# Patient Record
Sex: Female | Born: 1945
Health system: Southern US, Community
[De-identification: ages and names within clinical notes are randomized; demographics above are authoritative.]

## PROBLEM LIST (undated history)

## (undated) DIAGNOSIS — I739 Peripheral vascular disease, unspecified: Secondary | ICD-10-CM

## (undated) DIAGNOSIS — M545 Low back pain, unspecified: Secondary | ICD-10-CM

## (undated) DIAGNOSIS — Z91041 Radiographic dye allergy status: Secondary | ICD-10-CM

## (undated) DIAGNOSIS — K635 Polyp of colon: Secondary | ICD-10-CM

## (undated) DIAGNOSIS — I5032 Chronic diastolic (congestive) heart failure: Secondary | ICD-10-CM

## (undated) DIAGNOSIS — E785 Hyperlipidemia, unspecified: Secondary | ICD-10-CM

## (undated) DIAGNOSIS — I2510 Atherosclerotic heart disease of native coronary artery without angina pectoris: Secondary | ICD-10-CM

## (undated) DIAGNOSIS — R112 Nausea with vomiting, unspecified: Secondary | ICD-10-CM

## (undated) DIAGNOSIS — I779 Disorder of arteries and arterioles, unspecified: Secondary | ICD-10-CM

## (undated) DIAGNOSIS — E119 Type 2 diabetes mellitus without complications: Secondary | ICD-10-CM

## (undated) DIAGNOSIS — R740 Nonspecific elevation of levels of transaminase and lactic acid dehydrogenase [LDH]: Secondary | ICD-10-CM

## (undated) DIAGNOSIS — H402290 Chronic angle-closure glaucoma, unspecified eye, stage unspecified: Secondary | ICD-10-CM

## (undated) DIAGNOSIS — D649 Anemia, unspecified: Secondary | ICD-10-CM

## (undated) DIAGNOSIS — K2970 Gastritis, unspecified, without bleeding: Secondary | ICD-10-CM

## (undated) DIAGNOSIS — F32A Depression, unspecified: Secondary | ICD-10-CM

## (undated) DIAGNOSIS — Z9889 Other specified postprocedural states: Secondary | ICD-10-CM

## (undated) DIAGNOSIS — I10 Essential (primary) hypertension: Secondary | ICD-10-CM

## (undated) DIAGNOSIS — G709 Myoneural disorder, unspecified: Secondary | ICD-10-CM

## (undated) DIAGNOSIS — K219 Gastro-esophageal reflux disease without esophagitis: Secondary | ICD-10-CM

## (undated) DIAGNOSIS — I350 Nonrheumatic aortic (valve) stenosis: Secondary | ICD-10-CM

## (undated) DIAGNOSIS — R42 Dizziness and giddiness: Secondary | ICD-10-CM

## (undated) DIAGNOSIS — F419 Anxiety disorder, unspecified: Secondary | ICD-10-CM

## (undated) DIAGNOSIS — R918 Other nonspecific abnormal finding of lung field: Secondary | ICD-10-CM

## (undated) DIAGNOSIS — R55 Syncope and collapse: Secondary | ICD-10-CM

## (undated) DIAGNOSIS — Z952 Presence of prosthetic heart valve: Secondary | ICD-10-CM

## (undated) DIAGNOSIS — M1990 Unspecified osteoarthritis, unspecified site: Secondary | ICD-10-CM

## (undated) DIAGNOSIS — F329 Major depressive disorder, single episode, unspecified: Secondary | ICD-10-CM

## (undated) DIAGNOSIS — R7401 Elevation of levels of liver transaminase levels: Secondary | ICD-10-CM

## (undated) HISTORY — DX: Chronic angle-closure glaucoma, unspecified eye, stage unspecified: H40.2290

## (undated) HISTORY — DX: Dizziness and giddiness: R42

## (undated) HISTORY — PX: LACERATION REPAIR: SHX5168

## (undated) HISTORY — PX: NEUROPLASTY / TRANSPOSITION ULNAR NERVE AT ELBOW: SUR895

## (undated) HISTORY — DX: Atherosclerotic heart disease of native coronary artery without angina pectoris: I25.10

## (undated) HISTORY — DX: Hyperlipidemia, unspecified: E78.5

## (undated) HISTORY — PX: CATARACT EXTRACTION W/ INTRAOCULAR LENS  IMPLANT, BILATERAL: SHX1307

## (undated) HISTORY — DX: Presence of prosthetic heart valve: Z95.2

## (undated) HISTORY — PX: REFRACTIVE SURGERY: SHX103

## (undated) HISTORY — DX: Syncope and collapse: R55

## (undated) HISTORY — PX: KNEE SURGERY: SHX244

---

## 1966-05-24 HISTORY — PX: TONSILLECTOMY: SUR1361

## 1983-05-25 HISTORY — PX: VAGINAL HYSTERECTOMY: SUR661

## 1985-05-24 HISTORY — PX: BILATERAL OOPHORECTOMY: SHX1221

## 1987-05-25 HISTORY — PX: CARPAL TUNNEL RELEASE: SHX101

## 1988-05-24 HISTORY — PX: CHOLECYSTECTOMY: SHX55

## 1998-05-24 HISTORY — PX: CORONARY ARTERY BYPASS GRAFT: SHX141

## 1998-05-24 HISTORY — PX: CARDIAC VALVE REPLACEMENT: SHX585

## 1999-01-14 ENCOUNTER — Encounter: Payer: Self-pay | Admitting: Cardiovascular Disease

## 1999-01-14 ENCOUNTER — Ambulatory Visit (HOSPITAL_COMMUNITY): Admission: RE | Admit: 1999-01-14 | Discharge: 1999-01-14 | Payer: Self-pay | Admitting: Cardiovascular Disease

## 1999-01-14 HISTORY — PX: CARDIAC CATHETERIZATION: SHX172

## 1999-01-19 ENCOUNTER — Inpatient Hospital Stay (HOSPITAL_COMMUNITY): Admission: RE | Admit: 1999-01-19 | Discharge: 1999-01-23 | Payer: Self-pay | Admitting: Cardiothoracic Surgery

## 1999-01-19 ENCOUNTER — Encounter: Payer: Self-pay | Admitting: Cardiothoracic Surgery

## 1999-01-20 ENCOUNTER — Encounter: Payer: Self-pay | Admitting: Cardiothoracic Surgery

## 1999-01-21 ENCOUNTER — Encounter: Payer: Self-pay | Admitting: Cardiothoracic Surgery

## 1999-01-22 ENCOUNTER — Encounter: Payer: Self-pay | Admitting: Cardiothoracic Surgery

## 1999-02-03 ENCOUNTER — Inpatient Hospital Stay (HOSPITAL_COMMUNITY): Admission: EM | Admit: 1999-02-03 | Discharge: 1999-02-04 | Payer: Self-pay | Admitting: Emergency Medicine

## 1999-02-03 ENCOUNTER — Encounter: Payer: Self-pay | Admitting: Cardiovascular Disease

## 1999-02-24 ENCOUNTER — Encounter (HOSPITAL_COMMUNITY): Admission: RE | Admit: 1999-02-24 | Discharge: 1999-05-25 | Payer: Self-pay | Admitting: Family Medicine

## 1999-06-17 ENCOUNTER — Encounter: Payer: Self-pay | Admitting: Emergency Medicine

## 1999-06-17 ENCOUNTER — Emergency Department (HOSPITAL_COMMUNITY): Admission: EM | Admit: 1999-06-17 | Discharge: 1999-06-17 | Payer: Self-pay | Admitting: Emergency Medicine

## 1999-06-22 ENCOUNTER — Encounter: Admission: RE | Admit: 1999-06-22 | Discharge: 1999-09-20 | Payer: Self-pay | Admitting: Family Medicine

## 2000-04-25 ENCOUNTER — Encounter: Admission: RE | Admit: 2000-04-25 | Discharge: 2000-07-24 | Payer: Self-pay | Admitting: Family Medicine

## 2000-04-27 ENCOUNTER — Encounter: Payer: Self-pay | Admitting: Neurology

## 2000-04-27 ENCOUNTER — Ambulatory Visit (HOSPITAL_COMMUNITY): Admission: RE | Admit: 2000-04-27 | Discharge: 2000-04-27 | Payer: Self-pay | Admitting: Neurology

## 2000-11-12 ENCOUNTER — Inpatient Hospital Stay (HOSPITAL_COMMUNITY): Admission: EM | Admit: 2000-11-12 | Discharge: 2000-11-16 | Payer: Self-pay | Admitting: *Deleted

## 2000-11-12 ENCOUNTER — Encounter: Payer: Self-pay | Admitting: Emergency Medicine

## 2000-12-02 ENCOUNTER — Encounter: Admission: RE | Admit: 2000-12-02 | Discharge: 2000-12-02 | Payer: Self-pay | Admitting: Infectious Diseases

## 2001-09-06 ENCOUNTER — Ambulatory Visit (HOSPITAL_COMMUNITY): Admission: RE | Admit: 2001-09-06 | Discharge: 2001-09-06 | Payer: Self-pay | Admitting: Internal Medicine

## 2001-11-02 ENCOUNTER — Ambulatory Visit (HOSPITAL_COMMUNITY): Admission: RE | Admit: 2001-11-02 | Discharge: 2001-11-02 | Payer: Self-pay | Admitting: Gastroenterology

## 2001-11-02 ENCOUNTER — Encounter: Payer: Self-pay | Admitting: Internal Medicine

## 2002-01-22 ENCOUNTER — Emergency Department (HOSPITAL_COMMUNITY): Admission: EM | Admit: 2002-01-22 | Discharge: 2002-01-22 | Payer: Self-pay | Admitting: *Deleted

## 2002-01-22 ENCOUNTER — Encounter: Payer: Self-pay | Admitting: Emergency Medicine

## 2002-01-31 ENCOUNTER — Encounter: Payer: Self-pay | Admitting: Internal Medicine

## 2002-01-31 ENCOUNTER — Ambulatory Visit (HOSPITAL_COMMUNITY): Admission: RE | Admit: 2002-01-31 | Discharge: 2002-01-31 | Payer: Self-pay | Admitting: Internal Medicine

## 2002-02-07 ENCOUNTER — Ambulatory Visit (HOSPITAL_COMMUNITY): Admission: RE | Admit: 2002-02-07 | Discharge: 2002-02-07 | Payer: Self-pay | Admitting: Internal Medicine

## 2002-02-07 ENCOUNTER — Encounter: Payer: Self-pay | Admitting: Internal Medicine

## 2002-02-12 ENCOUNTER — Encounter: Admission: RE | Admit: 2002-02-12 | Discharge: 2002-02-12 | Payer: Self-pay | Admitting: Internal Medicine

## 2002-02-12 ENCOUNTER — Encounter: Payer: Self-pay | Admitting: Internal Medicine

## 2002-06-25 ENCOUNTER — Encounter: Payer: Self-pay | Admitting: Internal Medicine

## 2002-06-25 ENCOUNTER — Encounter: Admission: RE | Admit: 2002-06-25 | Discharge: 2002-06-25 | Payer: Self-pay | Admitting: Internal Medicine

## 2003-05-28 LAB — HM COLONOSCOPY: HM Colonoscopy: NORMAL

## 2003-10-08 ENCOUNTER — Encounter: Admission: RE | Admit: 2003-10-08 | Discharge: 2003-10-08 | Payer: Self-pay | Admitting: Sports Medicine

## 2003-11-14 ENCOUNTER — Encounter: Admission: RE | Admit: 2003-11-14 | Discharge: 2003-11-14 | Payer: Self-pay | Admitting: Sports Medicine

## 2004-07-24 ENCOUNTER — Emergency Department (HOSPITAL_COMMUNITY): Admission: EM | Admit: 2004-07-24 | Discharge: 2004-07-24 | Payer: Self-pay | Admitting: Emergency Medicine

## 2004-07-24 ENCOUNTER — Encounter: Admission: RE | Admit: 2004-07-24 | Discharge: 2004-10-22 | Payer: Self-pay | Admitting: Cardiovascular Disease

## 2004-08-10 ENCOUNTER — Emergency Department (HOSPITAL_COMMUNITY): Admission: EM | Admit: 2004-08-10 | Discharge: 2004-08-10 | Payer: Self-pay | Admitting: *Deleted

## 2004-10-26 ENCOUNTER — Encounter: Admission: RE | Admit: 2004-10-26 | Discharge: 2004-10-26 | Payer: Self-pay | Admitting: Internal Medicine

## 2006-09-29 ENCOUNTER — Encounter: Payer: Self-pay | Admitting: Internal Medicine

## 2006-11-03 ENCOUNTER — Encounter: Payer: Self-pay | Admitting: Internal Medicine

## 2006-11-10 ENCOUNTER — Encounter: Payer: Self-pay | Admitting: Internal Medicine

## 2007-03-13 ENCOUNTER — Encounter: Payer: Self-pay | Admitting: Internal Medicine

## 2007-05-25 HISTORY — PX: OTHER SURGICAL HISTORY: SHX169

## 2007-06-23 LAB — HM MAMMOGRAPHY: HM Mammogram: NORMAL

## 2008-04-01 ENCOUNTER — Encounter: Payer: Self-pay | Admitting: Internal Medicine

## 2008-04-09 ENCOUNTER — Encounter: Payer: Self-pay | Admitting: Internal Medicine

## 2008-04-10 ENCOUNTER — Encounter: Payer: Self-pay | Admitting: Internal Medicine

## 2008-07-01 ENCOUNTER — Ambulatory Visit: Payer: Self-pay | Admitting: Internal Medicine

## 2008-07-01 DIAGNOSIS — H8309 Labyrinthitis, unspecified ear: Secondary | ICD-10-CM | POA: Insufficient documentation

## 2008-07-01 DIAGNOSIS — E1143 Type 2 diabetes mellitus with diabetic autonomic (poly)neuropathy: Secondary | ICD-10-CM | POA: Insufficient documentation

## 2008-07-01 DIAGNOSIS — H402290 Chronic angle-closure glaucoma, unspecified eye, stage unspecified: Secondary | ICD-10-CM | POA: Insufficient documentation

## 2008-07-01 DIAGNOSIS — M810 Age-related osteoporosis without current pathological fracture: Secondary | ICD-10-CM | POA: Insufficient documentation

## 2008-07-01 DIAGNOSIS — M5126 Other intervertebral disc displacement, lumbar region: Secondary | ICD-10-CM | POA: Insufficient documentation

## 2008-07-01 DIAGNOSIS — I359 Nonrheumatic aortic valve disorder, unspecified: Secondary | ICD-10-CM | POA: Insufficient documentation

## 2008-07-01 DIAGNOSIS — M7730 Calcaneal spur, unspecified foot: Secondary | ICD-10-CM | POA: Insufficient documentation

## 2008-08-06 ENCOUNTER — Ambulatory Visit: Payer: Self-pay | Admitting: Internal Medicine

## 2008-08-06 ENCOUNTER — Telehealth: Payer: Self-pay | Admitting: Internal Medicine

## 2008-08-07 ENCOUNTER — Encounter: Payer: Self-pay | Admitting: Internal Medicine

## 2008-08-13 ENCOUNTER — Ambulatory Visit: Payer: Self-pay | Admitting: Internal Medicine

## 2008-08-19 ENCOUNTER — Encounter: Payer: Self-pay | Admitting: Internal Medicine

## 2008-08-26 ENCOUNTER — Encounter: Payer: Self-pay | Admitting: Internal Medicine

## 2008-12-30 ENCOUNTER — Ambulatory Visit: Payer: Self-pay | Admitting: Internal Medicine

## 2008-12-30 DIAGNOSIS — M159 Polyosteoarthritis, unspecified: Secondary | ICD-10-CM | POA: Insufficient documentation

## 2008-12-30 LAB — CONVERTED CEMR LAB
ALT: 41 units/L — ABNORMAL HIGH (ref 0–35)
AST: 50 units/L — ABNORMAL HIGH (ref 0–37)
Albumin: 4 g/dL (ref 3.5–5.2)
Alkaline Phosphatase: 59 units/L (ref 39–117)
BUN: 16 mg/dL (ref 6–23)
Bilirubin, Direct: 0.2 mg/dL (ref 0.0–0.3)
CO2: 31 meq/L (ref 19–32)
Calcium: 9.3 mg/dL (ref 8.4–10.5)
Chloride: 106 meq/L (ref 96–112)
Cholesterol: 128 mg/dL (ref 0–200)
Creatinine, Ser: 0.9 mg/dL (ref 0.4–1.2)
GFR calc non Af Amer: 67.14 mL/min (ref >60)
Glucose, Bld: 158 mg/dL — ABNORMAL HIGH (ref 70–99)
HDL: 42.1 mg/dL (ref >39.00)
Hgb A1c MFr Bld: 5.4 % (ref 4.6–6.5)
LDL Cholesterol: 62 mg/dL (ref 0–99)
Potassium: 4.8 meq/L (ref 3.5–5.1)
Sodium: 145 meq/L (ref 135–145)
Total Bilirubin: 1 mg/dL (ref 0.3–1.2)
Total CHOL/HDL Ratio: 3
Total Protein: 7.3 g/dL (ref 6.0–8.3)
Triglycerides: 118 mg/dL (ref 0.0–149.0)
VLDL: 23.6 mg/dL (ref 0.0–40.0)

## 2009-01-01 ENCOUNTER — Encounter: Payer: Self-pay | Admitting: Internal Medicine

## 2009-03-31 ENCOUNTER — Encounter: Payer: Self-pay | Admitting: Internal Medicine

## 2009-04-04 ENCOUNTER — Encounter
Admission: RE | Admit: 2009-04-04 | Discharge: 2009-04-04 | Payer: Self-pay | Admitting: Physical Medicine and Rehabilitation

## 2009-04-09 ENCOUNTER — Encounter: Payer: Self-pay | Admitting: Internal Medicine

## 2009-04-14 ENCOUNTER — Telehealth: Payer: Self-pay | Admitting: Internal Medicine

## 2009-04-29 ENCOUNTER — Encounter: Payer: Self-pay | Admitting: Internal Medicine

## 2009-05-26 ENCOUNTER — Encounter: Payer: Self-pay | Admitting: Internal Medicine

## 2009-08-26 ENCOUNTER — Encounter: Payer: Self-pay | Admitting: Internal Medicine

## 2009-09-03 ENCOUNTER — Encounter: Payer: Self-pay | Admitting: Internal Medicine

## 2009-09-25 ENCOUNTER — Encounter: Payer: Self-pay | Admitting: Internal Medicine

## 2009-10-23 ENCOUNTER — Encounter: Payer: Self-pay | Admitting: Internal Medicine

## 2009-10-27 ENCOUNTER — Encounter: Payer: Self-pay | Admitting: Internal Medicine

## 2009-11-24 ENCOUNTER — Encounter: Payer: Self-pay | Admitting: Internal Medicine

## 2009-12-15 ENCOUNTER — Ambulatory Visit: Payer: Self-pay | Admitting: Internal Medicine

## 2009-12-15 DIAGNOSIS — F418 Other specified anxiety disorders: Secondary | ICD-10-CM | POA: Insufficient documentation

## 2009-12-15 DIAGNOSIS — G609 Hereditary and idiopathic neuropathy, unspecified: Secondary | ICD-10-CM | POA: Insufficient documentation

## 2009-12-15 DIAGNOSIS — F331 Major depressive disorder, recurrent, moderate: Secondary | ICD-10-CM | POA: Insufficient documentation

## 2009-12-15 LAB — CONVERTED CEMR LAB
Folate: >20 ng/mL
Vitamin B-12: 530 pg/mL (ref 211–911)

## 2009-12-15 LAB — HM DIABETES FOOT EXAM

## 2009-12-16 DIAGNOSIS — G43809 Other migraine, not intractable, without status migrainosus: Secondary | ICD-10-CM | POA: Insufficient documentation

## 2009-12-30 ENCOUNTER — Encounter: Payer: Self-pay | Admitting: Internal Medicine

## 2010-02-05 ENCOUNTER — Telehealth: Payer: Self-pay | Admitting: Internal Medicine

## 2010-02-05 ENCOUNTER — Ambulatory Visit: Payer: Self-pay | Admitting: Internal Medicine

## 2010-02-05 DIAGNOSIS — R079 Chest pain, unspecified: Secondary | ICD-10-CM | POA: Insufficient documentation

## 2010-04-09 ENCOUNTER — Emergency Department (HOSPITAL_COMMUNITY)
Admission: EM | Admit: 2010-04-09 | Discharge: 2010-04-09 | Payer: Self-pay | Source: Home / Self Care | Admitting: Family Medicine

## 2010-04-30 ENCOUNTER — Telehealth: Payer: Self-pay | Admitting: Internal Medicine

## 2010-05-01 ENCOUNTER — Ambulatory Visit: Payer: Self-pay | Admitting: Internal Medicine

## 2010-05-01 LAB — CONVERTED CEMR LAB
Basophils Absolute: 0 10*3/uL (ref 0.0–0.1)
Basophils Relative: 0.8 % (ref 0.0–3.0)
Bilirubin Urine: NEGATIVE
Blood, UA: NEGATIVE
Eosinophils Absolute: 0.1 10*3/uL (ref 0.0–0.7)
Eosinophils Relative: 2.2 % (ref 0.0–5.0)
HCT: 35.1 % — ABNORMAL LOW (ref 36.0–46.0)
Hemoglobin: 12.1 g/dL (ref 12.0–15.0)
Ketones, ur: NEGATIVE mg/dL
Lymphocytes Relative: 36 % (ref 12.0–46.0)
Lymphs Abs: 1.8 10*3/uL (ref 0.7–4.0)
MCHC: 34.4 g/dL (ref 30.0–36.0)
MCV: 87.1 fL (ref 78.0–100.0)
Monocytes Absolute: 0.4 10*3/uL (ref 0.1–1.0)
Monocytes Relative: 8.2 % (ref 3.0–12.0)
Neutro Abs: 2.7 10*3/uL (ref 1.4–7.7)
Neutrophils Relative %: 52.8 % (ref 43.0–77.0)
Nitrite: NEGATIVE
Platelets: 173 10*3/uL (ref 150.0–400.0)
RBC: 4.03 M/uL (ref 3.87–5.11)
RDW: 16.1 % — ABNORMAL HIGH (ref 11.5–14.6)
Specific Gravity, Urine: 1.025 (ref 1.000–1.030)
Total Protein, Urine: NEGATIVE mg/dL
Urine Glucose: NEGATIVE mg/dL
Urobilinogen, UA: 1 (ref 0.0–1.0)
WBC: 5.1 10*3/uL (ref 4.5–10.5)
pH: 6 (ref 5.0–8.0)

## 2010-05-08 ENCOUNTER — Telehealth: Payer: Self-pay | Admitting: Internal Medicine

## 2010-05-08 ENCOUNTER — Ambulatory Visit: Payer: Self-pay | Admitting: Internal Medicine

## 2010-05-08 DIAGNOSIS — R310 Gross hematuria: Secondary | ICD-10-CM | POA: Insufficient documentation

## 2010-05-08 LAB — CONVERTED CEMR LAB
HCT: 35.6 % — ABNORMAL LOW (ref 36.0–46.0)
Hemoglobin: 12.2 g/dL (ref 12.0–15.0)
INR: 4 — ABNORMAL HIGH (ref 0.8–1.0)
Prothrombin Time: 42.7 s — ABNORMAL HIGH (ref 9.7–11.8)

## 2010-05-11 ENCOUNTER — Telehealth: Payer: Self-pay | Admitting: Internal Medicine

## 2010-05-20 ENCOUNTER — Encounter: Payer: Self-pay | Admitting: Internal Medicine

## 2010-05-24 HISTORY — PX: BREAST BIOPSY: SHX20

## 2010-06-01 ENCOUNTER — Encounter: Payer: Self-pay | Admitting: Internal Medicine

## 2010-06-23 NOTE — Letter (Signed)
[facility] [address] [place], [place]  [address] Phone: [phone]      January 01, 2009   [facility] [address] [place], [place] [address]  RE:  LAB RESULTS  Dear  Ms. KOZIEL,  The following is an interpretation of your most recent lab tests.  Please take note of any instructions provided or changes to medications that have resulted from your lab work.  ELECTROLYTES:  Good - no changes needed  KIDNEY FUNCTION TESTS:  Good - no changes needed  LIVER FUNCTION TESTS:  Good - no changes needed  LIPID PANEL:  Good - no changes needed Triglyceride: 118.0   Cholesterol: 128   LDL: 62   HDL: 42.10   Chol/HDL%:  3  DIABETIC STUDIES:  Excellent - no changes needed Blood Glucose: 158   HgbA1C: 5.4    Minimal elevation in liver functions. No change in medications at this time is indicated.   Great labs with very good control of blood sugar. Call or e-mail me if you have questions ([email or web address]).   Sincerely Yours,    Jacques Navy MD  Patient: Abigail Oliver Note: All result statuses are Final unless otherwise noted.  Tests: (1) Hemoglobin A1C (A1C)   Hemoglobin A1C            5.4 %                       4.6-6.5     Glycemic Control Guidelines for People with Diabetes:     Non Diabetic:  <6%     Goal of Therapy: <7%     Additional Action Suggested:  >8%   Tests: (2) BMP (METABOL)   Sodium                    145 mEq/L                   135-145   Potassium                 4.8 mEq/L                   3.5-5.1   Chloride                  106 mEq/L                   96-112   Carbon Dioxide            31 mEq/L                    19-32   Glucose              [H]  158 mg/dL                   13-08   BUN                       16 mg/dL                    6-57   Creatinine                0.9 mg/dL                   8.4-6.9   Calcium  9.3 mg/dL                   6.0-63.0   GFR                       67.14 mL/min                 >60  Tests: (3) Lipid Panel (LIPID)   Cholesterol               128 mg/dL                   1-601     ATP III Classification            Desirable:  < 200 mg/dL                    Borderline High:  200 - 239 mg/dL               High:  > = 240 mg/dL   Triglycerides             118.0 mg/dL                 0.9-323.5     Normal:  <150 mg/dL     Borderline High:  573 - 199 mg/dL   HDL                       22.02 mg/dL                 >54.27   VLDL Cholesterol          23.6 mg/dL                  0.6-23.7   LDL Cholesterol           62 mg/dL                    6-28  CHO/HDL Ratio:  CHD Risk                             3                    Men          Women     1/2 Average Risk     3.4          3.3     Average Risk          5.0          4.4     2X Average Risk          9.6          7.1     3X Average Risk          15.0          11.0                           Tests: (4) Hepatic/Liver Function Panel (HEPATIC)   Total Bilirubin           1.0 mg/dL                   3.1-5.1   Direct Bilirubin          0.2 mg/dL  0.0-0.3   Alkaline Phosphatase      59 U/L                      39-117   AST                  [H]  50 U/L                      0-37   ALT                  [H]  41 U/L                      0-35   Total Protein             7.3 g/dL                    9.5-6.3   Albumin                   4.0 g/dL                    8.7-5.6

## 2010-06-23 NOTE — Letter (Signed)
Summary: Low back pain & bilateral lower limb pain/[facility]  Low back pain & bilateral lower limb pain/[facility]   Imported By: Sherian Rein 04/07/2009 14:12:42  _____________________________________________________________________  External Attachment:    Type:   Image     Comment:   External Document

## 2010-06-23 NOTE — Procedures (Signed)
Summary: [facility]   Imported By: Sherian Rein 12/16/2009 09:25:56  _____________________________________________________________________  External Attachment:    Type:   Image     Comment:   External Document

## 2010-06-23 NOTE — Letter (Signed)
Summary: [facility]  [facility]   Imported By: Lester [place] 01/02/2010 10:41:20  _____________________________________________________________________  External Attachment:    Type:   Image     Comment:   External Document

## 2010-06-23 NOTE — Procedures (Signed)
Summary: Screening Colonoscopy/[facility]   Screening Colonoscopy/[facility]   Imported By: Maryln Gottron 07/15/2008 15:58:13  _____________________________________________________________________  External Attachment:    Type:   Image     Comment:   External Document

## 2010-06-23 NOTE — Letter (Signed)
Summary: Geralynn Rile  [facility]   Imported By: Lester Stark 06/04/2009 10:05:04  _____________________________________________________________________  External Attachment:    Type:   Image     Comment:   External Document

## 2010-06-23 NOTE — Progress Notes (Signed)
Summary: OV TODAY  Phone Note Call from Patient   Summary of Call: Pt c/o right sided rib pain under breast and SOB. She was leaning over cleaning and felt a pop on her right side. Scheduled for office visit with cxr first.   Initial call taken by: Lamar Sprinkles, CMA,  February 05, 2010 9:32 AM  New Problems: RIB PAIN, RIGHT SIDED (ICD-786.50)   New Problems: RIB PAIN, RIGHT SIDED (ICD-786.50)

## 2010-06-23 NOTE — Assessment & Plan Note (Signed)
Summary: add on per Dr.Asad Keeven-jwr   Vital Signs:  Patient profile:   65 year old female Height:      62 inches Weight:      199 pounds BMI:     36.53 Temp:     99.3 degrees F oral Pulse rate:   68 / minute BP sitting:   132 / 70  (left arm) Cuff size:   regular  Vitals Entered By: Zackery Barefoot CMA (August 06, 2008 4:20 PM)   Primary Care Provider:  Jacques Navy MD   History of Present Illness: Patient seen acute: she called reporting an erythematous rash in the periorbital area and icteric conjunctiva. She reports that she has developed an erythematous rash around both eyes. There is no pain and only minor itching. She has no eye pain or change in vision. she does report that she thought she was jaundiced although this seems to have resolved. She had been using a new make-up foundation and the rash was immediately proximal to this use.   Current Medications (verified): 1)  Vitamin C 500 Mg Tabs (Ascorbic Acid) .... Take 1 Tablet By Mouth Once A Day 2)  Vitamin D 1000 Unit Tabs (Cholecalciferol) .... Take 1 Tablet By Mouth Once A Day 3)  Calcium 600 Mg Tabs (Calcium) .... Take 1 Tablet By Mouth Once A Day 4)  Xalatan 0.005 % Soln (Latanoprost) .... One Drop Each Eye At Bedtime 5)  Alphagan P 0.1 % Soln (Brimonidine Tartrate) .... One Drop Each Eye Two Times A Day 6)  Warfarin Sodium 4 Mg Tabs (Warfarin Sodium) .... 4mg  M,w,f and 4 1/2mg  T,th,sat, Sun 7)  Klor-Con M10 10 Meq Cr-Tabs (Potassium Chloride Crys Cr) .... Daily 8)  Metoprolol Tartrate 25 Mg Tabs (Metoprolol Tartrate) .... Take 1 1/2 Tablet By Mouth Once A Day 9)  Cartia Xt 120 Mg Xr24h-Cap (Diltiazem Hcl Coated Beads) .... Take 1 Tablet By Mouth Once A Day 10)  Torsemide .... Take 1 Tablet By Mouth Two Times A Day 11)  Celexa 20 Mg Tabs (Citalopram Hydrobromide) .... Take 1 Tablet By Mouth Once A Day 12)  Simvastatin 40 Mg Tabs (Simvastatin) .... Take 1 Tablet By Mouth Once A Day 13)  Folic Acid 1 Mg Tabs  (Folic Acid) .Marland Kitchen.. 1 By Mouth Once Daily 14)  Ultram .... As Needed 15)  Flexeril .... As Needed 16)  Ambien .... As Needed  Allergies (verified): No Known Drug Allergies  Past History:  Past Medical History:    Reviewed history from 07/01/2008 and no changes required:    CORONARY ATHEROSCLEROSIS NATIVE CORONARY ARTERY (ICD-414.01)    DM (ICD-250.00)    OTHER AND UNSPECIFIED HYPERLIPIDEMIA (ICD-272.4)    CHRONIC ANGLE-CLOSURE GLAUCOMA (ICD-365.23)    AORTIC VALVE DISEASE (ICD-424.1)                Physician Roster:                Cardiology - Dr.Kelly                CV[facility] -        Dr. Morton Peters                Ortho -        Dr. Teressa Senter                Opthal -       Dr. Eulah Pont  Podiatry -    [facility] center                Ortho -        [facility] for hairline fractures  Past Surgical History:    Reviewed history from 07/01/2008 and no changes required:    Aortic Valve Replacement: '00    Carpal tunnel release '89    Cholecystectomy '90    Hysterectomy '85-fibroids    Oophorectomy '87    Ulnar nerve release '89            G4P2  2 SAB  Social History:    Reviewed history from 07/01/2008 and no changes required:       HSG, 2 years of college       married '67        1 son - '69; 1 daughter '71; 1 grandchild       work: Lawyer and Engineer, maintenance       Marriage is OK.  Review of Systems  The patient denies anorexia, fever, weight loss, weight gain, vision loss, syncope, peripheral edema, and headaches.    Physical Exam  General:  overweight white female NAD Head:  Normocephalic and atraumatic without obvious abnormalities. No apparent alopecia or balding. Eyes:  vision grossly intact, pupils equal, pupils round, and corneas and lenses clear.  Conjunctiva clear without icterus Lungs:  normal respiratory effort and normal breath sounds.   Heart:  normal rate and regular rhythm.   Skin:  erythematous macular rash in the periorbital region more  inferiorly than laterally or superior to the orbit. No open pupules, fairly distinct border. Cervical Nodes:  no anterior cervical adenopathy.     Impression & Recommendations:  Problem # 1:  CONTACT DERMATITIS (ICD-692.9) rash along with history strongly suggests a contact dermatitis secondary to make-up. No bulbar involvement. there is no jaundice  Plan: prednisone burst and taper.          avoid use of this comsmetic  Her updated medication list for this problem includes:    Prednisone 10 Mg Tabs (Prednisone) .Marland KitchenMarland KitchenMarland KitchenMarland Kitchen 3 tabs once daily x 3, 2 tabs q d x 3, 1 tab once daily x 6  Complete Medication List: 1)  Vitamin C 500 Mg Tabs (Ascorbic acid) .... Take 1 tablet by mouth once a day 2)  Vitamin D 1000 Unit Tabs (Cholecalciferol) .... Take 1 tablet by mouth once a day 3)  Calcium 600 Mg Tabs (Calcium) .... Take 1 tablet by mouth once a day 4)  Xalatan 0.005 % Soln (Latanoprost) .... One drop each eye at bedtime 5)  Alphagan P 0.1 % Soln (Brimonidine tartrate) .... One drop each eye two times a day 6)  Warfarin Sodium 4 Mg Tabs (Warfarin sodium) .... 4mg  m,w,f and 4 1/2mg  t,th,sat, sun 7)  Klor-con M10 10 Meq Cr-tabs (Potassium chloride crys cr) .... daily 8)  Metoprolol Tartrate 25 Mg Tabs (Metoprolol tartrate) .... Take 1 1/2 tablet by mouth once a day 9)  Cartia Xt 120 Mg Xr24h-cap (Diltiazem hcl coated beads) .... Take 1 tablet by mouth once a day 10)  Torsemide  .... Take 1 tablet by mouth two times a day 11)  Celexa 20 Mg Tabs (Citalopram hydrobromide) .... Take 1 tablet by mouth once a day 12)  Simvastatin 40 Mg Tabs (Simvastatin) .... Take 1 tablet by mouth once a day 13)  Folic Acid 1 Mg Tabs (Folic acid) .Marland Kitchen.. 1 by mouth once daily 14)  Ultram  ..Marland KitchenMarland Kitchen  As needed 15)  Flexeril  .... As needed 16)  Ambien  .... As needed 17)  Prednisone 10 Mg Tabs (Prednisone) .... 3 tabs once daily x 3, 2 tabs q d x 3, 1 tab once daily x 6 Prescriptions: PREDNISONE 10 MG TABS (PREDNISONE) 3  tabs once daily x 3, 2 tabs q d x 3, 1 tab once daily x 6  #21 x 0   Entered and Authorized by:   Jacques Navy MD   Signed by:   Jacques Navy MD on 08/06/2008   Method used:   Electronically to        [facility]. [phone]* (retail)       [address]       [place], [place]  [address]       Ph: [phone]       Fax: [phone]   RxID:   [id number]

## 2010-06-23 NOTE — Progress Notes (Signed)
  Phone Note Call from Patient Call back at Home Phone [phone]   Caller: Patient---[phone] Call For: Jacques Navy MD Summary of Call: Pt called and went to [facility] for what she thought was UTI and was treated w/meds. Tender spot/lump groin, blood in urine yesterday she is not sure about today. Pt wants advice on what to do, office visit today or tomorrow? Pt relies on other transportation and cant come till after 2:30 today of needed. Please advise. Initial call taken by: Verdell Face,  April 30, 2010 12:16 PM  Follow-up for Phone Call        tomorrow is ok with a U/A on arrival, CBC also 788.1 Follow-up by: Jacques Navy MD,  April 30, 2010 1:17 PM  Additional Follow-up for Phone Call Additional follow up Details #1::        Appt 12/9 @9am  w/Dr Norins. Pt will get lab work prior to appt. Additional Follow-up by: Verdell Face,  April 30, 2010 2:57 PM

## 2010-06-23 NOTE — Assessment & Plan Note (Signed)
Summary: FU---STC   Vital Signs:  Patient profile:   65 year old female Height:      62 inches Weight:      200 pounds BMI:     36.71 O2 Sat:      97 % on Room air Temp:     98.3 degrees F oral Pulse rate:   61 / minute BP sitting:   122 / 72  (left arm) Cuff size:   regular  Vitals Entered By: Bill Salinas CMA (December 15, 2009 1:04 PM)  O2 Flow:  Room air Comments pt states she no longer takes [place], Tramadol or acetaminophen   Primary Care Provider:  Jacques Navy MD   History of Present Illness: Patient has had a lot of testing since January for visual changes - Dr. Nile Riggs- bilateral cataract surgery with IOL. He told her that her vision changes were no due to glaucoma. She was referred  to Dr. Geralynn Ochs - no opthal problems - to Dr. Thad Ranger who ordered MRI brain - normal.  She has opthalmic migraines and does wear glasses for presbyopia.   For dizziness she has had a thorough eval by Dr. Tresa Endo: 2D echo - normal, NST - normal; Carotid doppler - negative. She had a 30 day event recorder that was negative.   She has intermittant episodes of dysequilbrium throughout the day with balance issues, exacerbated by head turning or position  change.  She has taken no medication. Again, MRI brain was negative.  She has long-standing pain in the legs which keep her from sleeping. She had a sleep study that did not reveal significant sleep apnea. she has had exam by Dr. Delton Prairie, podiatrist, who did punch biopsy which she said confirmed peripheral neuropathy. she was given an herbal mix that did not help. She has had no other medication and contnues to have disrupted sleep due to pain.   Current Medications (verified): 1)  Vitamin C 500 Mg Tabs (Ascorbic Acid) .... Take 1 Tablet By Mouth Once A Day 2)  Vitamin D 1000 Unit Tabs (Cholecalciferol) .... Take 1 Tablet By Mouth Once A Day 3)  Calcium 600 Mg Tabs (Calcium) .... Take 1 Tablet By Mouth Once A Day 4)  Xalatan 0.005 % Soln  (Latanoprost) .... One Drop Each Eye At Bedtime 5)  Alphagan P 0.1 % Soln (Brimonidine Tartrate) .... One Drop Each Eye Two Times A Day 6)  Warfarin Sodium 4 Mg Tabs (Warfarin Sodium) .... 4mg  M,w,f and 4 1/2mg  T,th,sat, Sun 7)  Klor-Con M10 10 Meq Cr-Tabs (Potassium Chloride Crys Cr) .... Daily 8)  Metoprolol Tartrate 25 Mg Tabs (Metoprolol Tartrate) .... Take 2 Tablet By Mouth Once A Day 9)  Cartia Xt 120 Mg Xr24h-Cap (Diltiazem Hcl Coated Beads) .... Take 1 Tablet By Mouth Once A Day 10)  Torsemide 20 Mg Tabs (Torsemide) .Marland Kitchen.. 1 By Mouth Two Times A Day 11)  Celexa 20 Mg Tabs (Citalopram Hydrobromide) .... Take 1 Tablet By Mouth Once A Day 12)  Simvastatin 40 Mg Tabs (Simvastatin) .... Take 1 Tablet By Mouth Once A Day 13)  Folic Acid 1 Mg Tabs (Folic Acid) .Marland Kitchen.. 1 By Mouth Once Daily 14)  Tramadol Hcl 50 Mg Tabs (Tramadol Hcl) .... 2 By Mouth Two Times A Day and Prn 15)  Cyclobenzaprine Hcl 10 Mg Tabs (Cyclobenzaprine Hcl) .Marland Kitchen.. 1 At Bedtime 16)  Zolpidem Tartrate 5 Mg Tabs (Zolpidem Tartrate) .Marland Kitchen.. 1 By Mouth At Bedtime 17)  Acetaminophen 500 Mg Caps (Acetaminophen) .Marland KitchenMarland KitchenMarland Kitchen  2 Caps Three Times A Day On Schedule 18)  Aspir-Low 81 Mg Tbec (Aspirin) .Marland Kitchen.. 1 Tab Daily  Allergies (verified): No Known Drug Allergies  Past History:  Past Medical History: Last updated: 07/01/2008 CORONARY ATHEROSCLEROSIS NATIVE CORONARY ARTERY (ICD-414.01) DM (ICD-250.00) OTHER AND UNSPECIFIED HYPERLIPIDEMIA (ICD-272.4) CHRONIC ANGLE-CLOSURE GLAUCOMA (ICD-365.23) AORTIC VALVE DISEASE (ICD-424.1)    Physician Roster:             Cardiology - Dr.Kelly             CVST -        Dr. Morton Peters             Ortho -        Dr. Teressa Senter             Opthal -       Dr. Eulah Pont             Podiatry -    Foot center             Ortho [facility] for hairline fractures  Past Surgical History: Last updated: 07/01/2008 Aortic Valve Replacement: '00 Carpal tunnel release '89 Cholecystectomy  '90 Hysterectomy '85-fibroids Oophorectomy '87 Ulnar nerve release '89   G4P2  2 SAB  Family History: Last updated: 07/01/2008 father- unkown mother - deceased @ 48: COPD, emphysema, colon cancer Neg - breast; DM  Social History: Last updated: 07/01/2008 HSG, 2 years of college married '67  1 son - '69; 1 daughter '71; 1 grandchild work: Lawyer and Engineer, maintenance Marriage is OK.  Risk Factors: Smoking Status: quit (12/30/2008) Passive Smoke Exposure: no (07/01/2008)  Review of Systems       The patient complains of decreased hearing, dyspnea on exertion, headaches, and depression.  The patient denies anorexia, fever, weight loss, vision loss, chest pain, syncope, peripheral edema, prolonged cough, hemoptysis, abdominal pain, severe indigestion/heartburn, muscle weakness, difficulty walking, abnormal bleeding, and enlarged lymph nodes.         chronic loss of hearing right.  Physical Exam  General:  overweight white female in no acute distress but concerned and worried Head:  normocephalic and atraumatic.  No tenderness over TMJs Eyes:  C&S, IOLs clear, pupils round Neck:  supple and no masses.   Chest Wall:  healed sternotomy Lungs:  normal respiratory effort, no accessory muscle use, and no wheezes.   Heart:  normal rate and regular rhythm.   Msk:  no joint tenderness, no joint swelling, and no joint deformities.   Pulses:  2+ radial and DP pulses Neurologic:  alert & oriented X3, cranial nerves II-XII intact, and strength normal in all extremities.  Slight ataxia with tandem gait. Skin:  turgor normal, color normal, and no suspicious lesions.   Psych:  Oriented X3, memory intact for recent and remote, normally interactive, and good eye contact.    Diabetes Management Exam:    Foot Exam (with socks and/or shoes not present):       Sensory-Pinprick/Light touch:          Left medial foot (L-4): diminished          Left dorsal foot (L-5): diminished          Left  lateral foot (S-1): diminished          Right medial foot (L-4): diminished          Right dorsal foot (L-5): diminished          Right  lateral foot (S-1): diminished       Sensory-other: decreased vibratory sensation distal fot and toes    Eye Exam:       Eye Exam done elsewhere          Date: 09/10/2009          Results: normal          Done by: Dr. Luciana Axe   Impression & Recommendations:  Problem # 1:  PERIPHERAL NEUROPATHY (ICD-356.9) Patient with biopsy proven nerve damage and an abnormal exam today c/w peripheral neuropathy most likely related to diabetes although other causes possible. She has had fairly extensive lab work previously done except for B12 level. Her foot/leg pain does interfere with her sleep contributing to other problems.   Plan - B12 level           Gabapentin 300mg  at bedtime x 5 then two times a day x 5, then three times a day. Will then reassess           Orders: TLB-B12 + Folate Pnl ([id number]/FOL)  Addendum - B12 normal  Problem # 2:  LABYRINTHITIS (ICD-386.30) Patient with dysequilibrium. She has had full neuro evaluation with negative exams and normal MRI brain, normal cardiac evaluation with unremarkable event recorder, carotid dopplers, NST.  Plan - provided full explanation using anatomy book           meclizine 12.5mg  three times a day as needed   Problem # 3:  OSTEOPOROSIS (ICD-733.00) Patient reports last DXA was 1 year ago. Recommended DXA in 1 year - 2 year interval. She is currently on no medication.  Problem # 4:  DM (ICD-250.00) Answered patient's questions about her diagnosis. Reviewed labs - most recent A1C at [facility] 6.4%  Plan - continue diet management           D/C CBG monitoring and do only as needed for possible hypoglycemia if feeling bad.           A1C in August or September (q 6 months)  The following medications were removed from the medication list:    Aspir-low 81 Mg Tbec (Aspirin) .Marland Kitchen... 1 tab daily  Problem # 5:   MIGRAINE, OPHTHALMIC (ICD-346.80) Patient self-desribes opthalmic migraines with visual changes but no pain. these occur with relative frequency. She will also have lancinating pain in both ears which may also be a migraine equivalent. She is on a beta-blocker for cardiac reasons.  Plan - will consider migraine rophylaxis, i.e. topamax after other problems more stable.           May need neuro consult with Dr. Clarisse Gouge  The following medications were removed from the medication list:    Tramadol Hcl 50 Mg Tabs (Tramadol hcl) .Marland Kitchen... 2 by mouth two times a day and prn    Acetaminophen 500 Mg Caps (Acetaminophen) .Marland Kitchen... 2 caps three times a day on schedule    Aspir-low 81 Mg Tbec (Aspirin) .Marland Kitchen... 1 tab daily Her updated medication list for this problem includes:    Metoprolol Tartrate 25 Mg Tabs (Metoprolol tartrate) .Marland Kitchen... Take 2 tablet by mouth once a day  Problem # 6:  DEPRESSION, MAJOR, RECURRENT, MODERATE (ICD-296.32) Patient with history of depression that has been worse over the past several months and Celexa seems less effective to her. She is frequently tearful and unhappy, feels hopeless and frustrated. \Her symptoms may be compounded, if not caused, by poor sleep due to pain and her persistent medical symptoms.  Plan - address underlying  problems           May need to increase medication dose.   Problem # 7:  CORONARY ATHEROSCLEROSIS NATIVE CORONARY ARTERY (ICD-414.01) Stable with recent full evaluation including NST, 2 D echo at [facility]  Plan - continue all medications.           Follow-up with Dr. Tresa Endo as instructed.  The following medications were removed from the medication list:    Cartia Xt 120 Mg Xr24h-cap (Diltiazem hcl coated beads) .Marland Kitchen... Take 1 tablet by mouth once a day    Aspir-low 81 Mg Tbec (Aspirin) .Marland Kitchen... 1 tab daily Her updated medication list for this problem includes:    Metoprolol Tartrate 25 Mg Tabs (Metoprolol tartrate) .Marland Kitchen... Take 2 tablet by mouth once a day     Torsemide 20 Mg Tabs (Torsemide) .Marland Kitchen... 1 by mouth two times a day  Problem # 8:  OTHER AND UNSPECIFIED HYPERLIPIDEMIA (ICD-272.4) Reviewed out side labs - patient with good control on present medications.  Her updated medication list for this problem includes:    Simvastatin 40 Mg Tabs (Simvastatin) .Marland Kitchen... Take 1 tablet by mouth once a day  Complete Medication List: 1)  Vitamin C 500 Mg Tabs (Ascorbic acid) .... Take 1 tablet by mouth once a day 2)  Vitamin D 1000 Unit Tabs (Cholecalciferol) .... Take 1 tablet by mouth once a day 3)  Calcium 600 Mg Tabs (Calcium) .... Take 1 tablet by mouth once a day 4)  Xalatan 0.005 % Soln (Latanoprost) .... One drop each eye at bedtime 5)  Alphagan P 0.1 % Soln (Brimonidine tartrate) .... One drop each eye two times a day 6)  Warfarin Sodium 4 Mg Tabs (Warfarin sodium) .... 4mg  m,w,f and 4 1/2mg  t,th,sat, sun 7)  Klor-con M10 10 Meq Cr-tabs (Potassium chloride crys cr) .... daily 8)  Metoprolol Tartrate 25 Mg Tabs (Metoprolol tartrate) .... Take 2 tablet by mouth once a day 9)  Torsemide 20 Mg Tabs (Torsemide) .Marland Kitchen.. 1 by mouth two times a day 10)  Celexa 20 Mg Tabs (Citalopram hydrobromide) .... Take 1 tablet by mouth once a day 11)  Simvastatin 40 Mg Tabs (Simvastatin) .... Take 1 tablet by mouth once a day 12)  Folic Acid 1 Mg Tabs (Folic acid) .Marland Kitchen.. 1 by mouth once daily 13)  Cyclobenzaprine Hcl 10 Mg Tabs (Cyclobenzaprine hcl) .Marland Kitchen.. 1 at bedtime 14)  Gabapentin 300 Mg Caps (Gabapentin) .Marland Kitchen.. 1 by mouth three times a day. 15)  Meclizine Hcl 12.5 Mg Tabs (Meclizine hcl) .Marland Kitchen.. 1 by mouth q8 as needed for labyrinthitis  Patient Instructions: 1)  Dizziness - most likely labyrinthitis - a malfunctioning gyroscope. Plan - take meclizine as needed or routinely if you have dizziness every day. 2)  Peripheral neuropathy - confirmed on physical exam - take gabapentin up to 300mg  three times a day. If not adequate relief to allow sleep we can increase to 600mg   three times a day. 3)  Visual changes - negative work-up. Opthalmic migraines may be playing a role, as well as causing sharp ear pain. Plan - we will think about adding a migraine preventive medication after we get #'s 1 & 2 sorted out. 4)  Diabetes - no need to check blood sugars. 5)  Blood counts - no significant anemia or low white count.  6)  Depression - continue celexa. You will most certainly feel better if we can get you to sleep by controlling the peripheral neuropathy pain.  7)  All  things Heart - defer to Dr. Tresa Endo.  Prescriptions: MECLIZINE HCL 12.5 MG TABS (MECLIZINE HCL) 1 by mouth q8 as needed for labyrinthitis  #100 x 3   Entered and Authorized by:   Jacques Navy MD   Signed by:   Jacques Navy MD on 12/15/2009   Method used:   Electronically to        [facility]. [phone]* (retail)       [address]       [place], [place]  [address]       Ph: [phone]       Fax: [phone]   RxID:   [id number]    Immunization History:  Tetanus/Td Immunization History:    Tetanus/Td:  historical (07/22/2005)

## 2010-06-23 NOTE — Progress Notes (Signed)
Summary: Eye rash  Phone Note Call from Patient Call back at [name] [phone]   Caller: Son Summary of Call: Abigail Oliver c/o pt rash starting on right eye x 1 week ago spreading to left eye with puffiness. The day prior c/o nose bleed. This AM also c/o jaundice.  Pt has appointment Tuesday 08/13/2008 @ 1pm. Please advise. Initial call taken by: Zackery Barefoot CMA,  August 06, 2008 11:30 AM  Follow-up for Phone Call        Okay to add on per Dr. Debby Bud. Appointment scheduled for today pt advised to be here @ 4pm and there will be a wait. Follow-up by: Zackery Barefoot CMA,  August 06, 2008 1:20 PM

## 2010-06-23 NOTE — Letter (Signed)
Summary: [facility]  [facility]   Imported By: Lester Cottonwood 08/29/2009 09:34:38  _____________________________________________________________________  External Attachment:    Type:   Image     Comment:   External Document

## 2010-06-23 NOTE — Assessment & Plan Note (Signed)
Summary: 4-6 wk f/u  cd   Vital Signs:  Patient profile:   65 year old female Height:      62 inches Weight:      199 pounds BMI:     36.53 Temp:     98.9 degrees F oral Pulse rate:   68 / minute BP sitting:   130 / 64  (left arm) Cuff size:   regular  Vitals Entered By: Zackery Barefoot CMA (August 13, 2008 1:09 PM)  Primary Care Provider:  Jacques Navy MD   History of Present Illness: Seen March 16th for contact dermatitis that did resolve with steroids. She is finishing that now. None of the records requested have arrived and so I have no labs.  She is feeling well.   Current Medications (verified): 1)  Vitamin C 500 Mg Tabs (Ascorbic Acid) .... Take 1 Tablet By Mouth Once A Day 2)  Vitamin D 1000 Unit Tabs (Cholecalciferol) .... Take 1 Tablet By Mouth Once A Day 3)  Calcium 600 Mg Tabs (Calcium) .... Take 1 Tablet By Mouth Once A Day 4)  Xalatan 0.005 % Soln (Latanoprost) .... One Drop Each Eye At Bedtime 5)  Alphagan P 0.1 % Soln (Brimonidine Tartrate) .... One Drop Each Eye Two Times A Day 6)  Warfarin Sodium 4 Mg Tabs (Warfarin Sodium) .... 4mg  M,w,f and 4 1/2mg  T,th,sat, Sun 7)  Klor-Con M10 10 Meq Cr-Tabs (Potassium Chloride Crys Cr) .... Daily 8)  Metoprolol Tartrate 25 Mg Tabs (Metoprolol Tartrate) .... Take 1 1/2 Tablet By Mouth Once A Day 9)  Cartia Xt 120 Mg Xr24h-Cap (Diltiazem Hcl Coated Beads) .... Take 1 Tablet By Mouth Once A Day 10)  Torsemide .... Take 1 Tablet By Mouth Two Times A Day 11)  Celexa 20 Mg Tabs (Citalopram Hydrobromide) .... Take 1 Tablet By Mouth Once A Day 12)  Simvastatin 40 Mg Tabs (Simvastatin) .... Take 1 Tablet By Mouth Once A Day 13)  Folic Acid 1 Mg Tabs (Folic Acid) .Marland Kitchen.. 1 By Mouth Once Daily 14)  Ultram .... As Needed 15)  Flexeril .... As Needed 16)  Ambien .... As Needed 17)  Prednisone 10 Mg Tabs (Prednisone) .... 3 Tabs Once Daily X 3, 2 Tabs Q D X 3, 1 Tab Once Daily X 6  Allergies (verified): No Known Drug  Allergies PMH-FH-SH reviewed for relevance  Review of Systems  The patient denies anorexia, fever, weight loss, decreased hearing, chest pain, peripheral edema, and abdominal pain.    Physical Exam  General:  overweight white female NAD Head:  normocephalic.   Lungs:  normal respiratory effort and normal breath sounds.   Skin:  facial rash resolved without any residual skin lesions.   Impression & Recommendations:  Problem # 1:  CONTACT DERMATITIS (ICD-692.9) Resolved dermatitis.   Plan: finish prednisone.  Her updated medication list for this problem includes:    Prednisone 10 Mg Tabs (Prednisone) .Marland KitchenMarland KitchenMarland KitchenMarland Kitchen 3 tabs once daily x 3, 2 tabs q d x 3, 1 tab once daily x 6  Problem # 2:  LABYRINTHITIS (ICD-386.30) resolved - never needed medication  Problem # 3:  OTHER AND UNSPECIFIED HYPERLIPIDEMIA (ICD-272.4) awaiting labs from [facility]- will review and make recommendations as appropriate.  Her updated medication list for this problem includes:    Simvastatin 40 Mg Tabs (Simvastatin) .Marland Kitchen... Take 1 tablet by mouth once a day  Problem # 4:  DM (ICD-250.00) Awaiting out side labs: will make recommendations as indicated.  Complete Medication List: 1)  Vitamin C 500 Mg Tabs (Ascorbic acid) .... Take 1 tablet by mouth once a day 2)  Vitamin D 1000 Unit Tabs (Cholecalciferol) .... Take 1 tablet by mouth once a day 3)  Calcium 600 Mg Tabs (Calcium) .... Take 1 tablet by mouth once a day 4)  Xalatan 0.005 % Soln (Latanoprost) .... One drop each eye at bedtime 5)  Alphagan P 0.1 % Soln (Brimonidine tartrate) .... One drop each eye two times a day 6)  Warfarin Sodium 4 Mg Tabs (Warfarin sodium) .... 4mg  m,w,f and 4 1/2mg  t,th,sat, sun 7)  Klor-con M10 10 Meq Cr-tabs (Potassium chloride crys cr) .... daily 8)  Metoprolol Tartrate 25 Mg Tabs (Metoprolol tartrate) .... Take 1 1/2 tablet by mouth once a day 9)  Cartia Xt 120 Mg Xr24h-cap (Diltiazem hcl coated beads) .... Take 1 tablet by  mouth once a day 10)  Torsemide  .... Take 1 tablet by mouth two times a day 11)  Celexa 20 Mg Tabs (Citalopram hydrobromide) .... Take 1 tablet by mouth once a day 12)  Simvastatin 40 Mg Tabs (Simvastatin) .... Take 1 tablet by mouth once a day 13)  Folic Acid 1 Mg Tabs (Folic acid) .Marland Kitchen.. 1 by mouth once daily 14)  Ultram  .... As needed 15)  Flexeril  .... As needed 16)  Ambien  .... As needed 17)  Prednisone 10 Mg Tabs (Prednisone) .... 3 tabs once daily x 3, 2 tabs q d x 3, 1 tab once daily x 6

## 2010-06-23 NOTE — Letter (Signed)
Summary: Cardiologic eval/[facility]  Cardiologic eval/[facility]   Imported By: Lester Markham 08/02/2008 11:24:41  _____________________________________________________________________  External Attachment:    Type:   Image     Comment:   External Document

## 2010-06-23 NOTE — Assessment & Plan Note (Signed)
Summary: right sided rib pain/SD   Vital Signs:  Patient profile:   65 year old female Height:      62 inches Weight:      207 pounds BMI:     38.00 O2 Sat:      98 % on Room air Temp:     98.6 degrees F oral Pulse rate:   91 / minute BP sitting:   138 / 70  (left arm) Cuff size:   regular  Vitals Entered By: Bill Salinas CMA (February 05, 2010 10:58 AM)  O2 Flow:  Room air CC: pt here with c/o right sided pain in ribcage area x 2 days/ ab   Primary Care Provider:  Jacques Navy MD  CC:  pt here with c/o right sided pain in ribcage area x 2 days/ ab.  History of Present Illness: Patient presents acutely due to right sided chest pain. she reached over the side of her chair last night, heard something "pop" and has had right chest wall pain since. she had a hard time sleeping due to discomfort. she hurts with deep inspiration. There is no internal pain. No pain except with movement.   Current Medications (verified): 1)  Vitamin C 500 Mg Tabs (Ascorbic Acid) .... Take 1 Tablet By Mouth Once A Day 2)  Vitamin D 1000 Unit Tabs (Cholecalciferol) .... Take 1 Tablet By Mouth Once A Day 3)  Calcium 600 Mg Tabs (Calcium) .... Take 1 Tablet By Mouth Once A Day 4)  Xalatan 0.005 % Soln (Latanoprost) .... One Drop Each Eye At Bedtime 5)  Alphagan P 0.1 % Soln (Brimonidine Tartrate) .... One Drop Each Eye Two Times A Day 6)  Warfarin Sodium 4 Mg Tabs (Warfarin Sodium) .... 4mg  M,w,f and 4 1/2mg  T,th,sat, Sun 7)  Klor-Con M10 10 Meq Cr-Tabs (Potassium Chloride Crys Cr) .... Daily 8)  Metoprolol Tartrate 25 Mg Tabs (Metoprolol Tartrate) .... Take 2 Tablet By Mouth Once A Day 9)  Torsemide 20 Mg Tabs (Torsemide) .Marland Kitchen.. 1 By Mouth Two Times A Day 10)  Celexa 20 Mg Tabs (Citalopram Hydrobromide) .... Take 1 Tablet By Mouth Once A Day 11)  Simvastatin 40 Mg Tabs (Simvastatin) .... Take 1 Tablet By Mouth Once A Day 12)  Folic Acid 1 Mg Tabs (Folic Acid) .Marland Kitchen.. 1 By Mouth Once Daily 13)   Cyclobenzaprine Hcl 10 Mg Tabs (Cyclobenzaprine Hcl) .Marland Kitchen.. 1 At Bedtime 14)  Gabapentin 300 Mg Caps (Gabapentin) .Marland Kitchen.. 1 By Mouth Three Times A Day. 15)  Meclizine Hcl 12.5 Mg Tabs (Meclizine Hcl) .Marland Kitchen.. 1 By Mouth Q8 As Needed For Labyrinthitis  Allergies (verified): No Known Drug Allergies  Past History:  Past Medical History: Last updated: 07/01/2008 CORONARY ATHEROSCLEROSIS NATIVE CORONARY ARTERY (ICD-414.01) DM (ICD-250.00) OTHER AND UNSPECIFIED HYPERLIPIDEMIA (ICD-272.4) CHRONIC ANGLE-CLOSURE GLAUCOMA (ICD-365.23) AORTIC VALVE DISEASE (ICD-424.1)    Physician Roster:             Cardiology - Dr.Kelly             CVST -        Dr. Morton Peters             Ortho -        Dr. Teressa Senter             Opthal -       Dr. Eulah Pont             Podiatry -    Foot center  [facility] for hairline [facility]  Past Surgical History: Last updated: 07/01/2008 Aortic Valve Replacement: '00 Carpal tunnel release '89 Cholecystectomy '90 Hysterectomy '85-fibroids Oophorectomy '87 Ulnar nerve release '89   G4P2  2 SAB PSH reviewed for relevance, FH reviewed for relevance  Review of Systems       The patient complains of chest pain.  The patient denies anorexia, fever, weight loss, weight gain, hoarseness, syncope, dyspnea on exertion, prolonged cough, abdominal pain, muscle weakness, difficulty walking, and depression.    Physical Exam  General:  overweight white female no acute distress Head:  normocephalic and atraumatic.   Neck:  supple and full ROM.   Chest Wall:  verytender to palpation at the intercostal spaces along the right axillary line. No palpable mass.  Lungs:  normal respiratory effort, normal breath sounds, no crackles, and no wheezes.   Heart:  normal rate and regular rhythm.   Msk:  normal ROM, no joint tenderness, no joint swelling, and no joint deformities.   Pulses:  2+ radial right  Neurologic:  alert & oriented X3, cranial nerves  II-XII intact, and gait normal.     Impression & Recommendations:  Problem # 1:  RIB PAIN, RIGHT SIDED (ICD-786.50) Normal exam except for tenderness. CXR negative for rib fracture or other abnormality.  Plan - therma patch           lineament of choice           OTC NSAIDs.  Complete Medication List: 1)  Vitamin C 500 Mg Tabs (Ascorbic acid) .... Take 1 tablet by mouth once a day 2)  Vitamin D 1000 Unit Tabs (Cholecalciferol) .... Take 1 tablet by mouth once a day 3)  Calcium 600 Mg Tabs (Calcium) .... Take 1 tablet by mouth once a day 4)  Xalatan 0.005 % Soln (Latanoprost) .... One drop each eye at bedtime 5)  Alphagan P 0.1 % Soln (Brimonidine tartrate) .... One drop each eye two times a day 6)  Warfarin Sodium 4 Mg Tabs (Warfarin sodium) .... 4mg  m,w,f and 4 1/2mg  t,th,sat, sun 7)  Klor-con M10 10 Meq Cr-tabs (Potassium chloride crys cr) .... daily 8)  Metoprolol Tartrate 25 Mg Tabs (Metoprolol tartrate) .... Take 2 tablet by mouth once a day 9)  Torsemide 20 Mg Tabs (Torsemide) .Marland Kitchen.. 1 by mouth two times a day 10)  Celexa 20 Mg Tabs (Citalopram hydrobromide) .... Take 1 tablet by mouth once a day 11)  Simvastatin 40 Mg Tabs (Simvastatin) .... Take 1 tablet by mouth once a day 12)  Folic Acid 1 Mg Tabs (Folic acid) .Marland Kitchen.. 1 by mouth once daily 13)  Cyclobenzaprine Hcl 10 Mg Tabs (Cyclobenzaprine hcl) .Marland Kitchen.. 1 at bedtime 14)  Gabapentin 300 Mg Caps (Gabapentin) .Marland Kitchen.. 1 by mouth three times a day. 15)  Meclizine Hcl 12.5 Mg Tabs (Meclizine hcl) .Marland Kitchen.. 1 by mouth q8 as needed for labyrinthitis 16)  Norco 5-325 Mg Tabs (Hydrocodone-acetaminophen) .Marland Kitchen.. 1 by mouth q6 as needed chest wall pain Prescriptions: GABAPENTIN 300 MG CAPS (GABAPENTIN) 1 by mouth three times a day.  #90 x 3   Entered and Authorized by:   Jacques Navy MD   Signed by:   Jacques Navy MD on 02/05/2010   Method used:   Electronically to        [facility]* (retail)             ,  Ph:  [phone]       Fax: [phone]   RxID:   [id number] CYCLOBENZAPRINE HCL 10 MG TABS (CYCLOBENZAPRINE HCL) 1 at bedtime  #90 x 3   Entered and Authorized by:   Jacques Navy MD   Signed by:   Jacques Navy MD on 02/05/2010   Method used:   Electronically to        [facility]* (retail)             ,          Ph: [phone]       Fax: [phone]   RxID:   [id number] NORCO 5-325 MG TABS (HYDROCODONE-ACETAMINOPHEN) 1 by mouth q6 as needed chest wall pain  #30 x 1   Entered and Authorized by:   Jacques Navy MD   Signed by:   Jacques Navy MD on 02/05/2010   Method used:   Print then Give to Patient   RxID:   [id number]

## 2010-06-25 NOTE — Progress Notes (Signed)
Summary: Bleeding  Phone Note Call from Patient Call back at Work Phone [phone]   Summary of Call: Pt left vm, bleeding has not subsided. What is next step? She completed antibiotic.  Initial call taken by: Lamar Sprinkles, CMA,  May 08, 2010 10:46 AM  Follow-up for Phone Call        1. septra can interact with coumadin - should have an INR 2. will need to see urology - will notify [facility]. IF the bleeding is heavy and constant needs to come in for hemoglobin/hct STAT to determine if she needs in-patient treatment.  Follow-up by: Jacques Navy MD,  May 08, 2010 1:08 PM  Additional Follow-up for Phone Call Additional follow up Details #1::        Labs entered into [facility], per scheduling pt called for advisment// was told to come in lab.Alvy Beal Archie CMA  May 08, 2010 1:32 PM   New Problems: GROSS HEMATURIA (ICD-599.71)   Additional Follow-up for Phone Call Additional follow up Details #2::    Labs reveiwed: Hgb is 12.2 OK. INR 4.0  Plan - hold coumadin for two days then resume regular dosing           will move ahead with appointment with urology.  Follow-up by: Jacques Navy MD,  May 08, 2010 2:55 PM  Additional Follow-up for Phone Call Additional follow up Details #3:: Details for Additional Follow-up Action Taken: Pt informed, she reports that bleeding is from rectum. Has urgent loose stools after meals. Blood is bright red, happens w/every BM but also had 2 episodes of bleeding inbetween BM's. MD ADVISED office visit NOW, Pt informed.............Marland KitchenLamar Sprinkles, CMA  May 08, 2010 3:08 PM  Additional Follow-up by: Jacques Navy MD,  May 08, 2010 5:47 PM  New Problems: GROSS HEMATURIA (ICD-599.71)

## 2010-06-25 NOTE — Progress Notes (Signed)
  Phone Note Call from Patient   Caller: Patient--[phone] Call For: Jacques Navy MD Summary of Call: Pt states she is unable to see her GI MD, Dr Loreta Ave as she is away. She is off coumadin and wants to know what Dr Debby Bud suggest she do as Dr Loreta Ave is off for 2 wks. Initial call taken by: Verdell Face,  May 11, 2010 3:42 PM  Follow-up for Phone Call        caqlled patient. She had a PT drawn today and result is pending. If INR 2.5ish she can restart coumadin.   Ok to wait until Dr. Loreta Ave is back for appointment and possible colonoscopy Follow-up by: Jacques Navy MD,  May 11, 2010 5:57 PM

## 2010-06-25 NOTE — Letter (Signed)
[facility]   Imported By: Sherian Rein 06/04/2010 12:59:18  _____________________________________________________________________  External Attachment:    Type:   Image     Comment:   External Document

## 2010-06-25 NOTE — Letter (Signed)
Summary: [facility]   Imported By: Sherian Rein 06/09/2010 09:14:30  _____________________________________________________________________  External Attachment:    Type:   Image     Comment:   External Document

## 2010-06-25 NOTE — Assessment & Plan Note (Signed)
Summary: per phone note/?uti/#/cd   Vital Signs:  Patient profile:   65 year old female Height:      62 inches Weight:      206 pounds BMI:     37.81 O2 Sat:      96 % on Room air Temp:     98.0 degrees F oral Pulse rate:   62 / minute BP sitting:   104 / 62  (left arm) Cuff size:   regular  Vitals Entered By: Bill Salinas CMA (May 01, 2010 8:53 AM)  O2 Flow:  Room air CC: pt here for evaluation tender spot/lump on groin. Pt had episode of blood in urine she had ua and CBC this am/ ab   Primary Care Provider:  Jacques Navy MD  CC:  pt here for evaluation tender spot/lump on groin. Pt had episode of blood in urine she had ua and CBC this am/ ab.  History of Present Illness: Patient presents for pain in the RLQ abdomen for several weeks. It is positional. She rates it as a 6-7/10 that is intermittent but it has been constant. She was seen at [facility] Nov 17th to have a potassium check. Note reviewed. She did have hematuria by dipstick, nl renal function. she was treated for a UTI although she had no symptoms of a uti and was afebrile. No WBC was done. She did see blood in her urine on Tuesday.  Current Medications (verified): 1)  Vitamin C 500 Mg Tabs (Ascorbic Acid) .... Take 1 Tablet By Mouth Once A Day 2)  Vitamin D 1000 Unit Tabs (Cholecalciferol) .... Take 1 Tablet By Mouth Once A Day 3)  Calcium 600 Mg Tabs (Calcium) .... Take 1 Tablet By Mouth Once A Day 4)  Xalatan 0.005 % Soln (Latanoprost) .... One Drop Each Eye At Bedtime 5)  Alphagan P 0.1 % Soln (Brimonidine Tartrate) .... One Drop Each Eye Two Times A Day 6)  Warfarin Sodium 4 Mg Tabs (Warfarin Sodium) .... 4mg  M,w,f and 4 1/2mg  T,th,sat, Sun 7)  Klor-Con M10 10 Meq Cr-Tabs (Potassium Chloride Crys Cr) .... Daily 8)  Metoprolol Tartrate 25 Mg Tabs (Metoprolol Tartrate) .... Take 2 Tablet By Mouth Once A Day 9)  Torsemide 20 Mg Tabs (Torsemide) .Marland Kitchen.. 1 By Mouth Two Times A Day 10)  Celexa 20 Mg  Tabs (Citalopram Hydrobromide) .... Take 1 Tablet By Mouth Once A Day 11)  Simvastatin 40 Mg Tabs (Simvastatin) .... Take 1 Tablet By Mouth Once A Day 12)  Folic Acid 1 Mg Tabs (Folic Acid) .Marland Kitchen.. 1 By Mouth Once Daily 13)  Cyclobenzaprine Hcl 10 Mg Tabs (Cyclobenzaprine Hcl) .Marland Kitchen.. 1 At Bedtime 14)  Gabapentin 300 Mg Caps (Gabapentin) .Marland Kitchen.. 1 By Mouth At Bedtime 15)  Meclizine Hcl 12.5 Mg Tabs (Meclizine Hcl) .Marland Kitchen.. 1 By Mouth Q8 As Needed For Labyrinthitis 16)  Norco 5-325 Mg Tabs (Hydrocodone-Acetaminophen) .Marland Kitchen.. 1 By Mouth Q6 As Needed Chest Wall Pain  Allergies (verified): No Known Drug Allergies  Past History:  Past Medical History: Last updated: 07/01/2008 CORONARY ATHEROSCLEROSIS NATIVE CORONARY ARTERY (ICD-414.01) DM (ICD-250.00) OTHER AND UNSPECIFIED HYPERLIPIDEMIA (ICD-272.4) CHRONIC ANGLE-CLOSURE GLAUCOMA (ICD-365.23) AORTIC VALVE DISEASE (ICD-424.1)    Physician Roster:             Cardiology - Dr.Kelly             CVST -        Dr. Morton Peters  Ortho -        Dr. Teressa Senter             Opthal -       Dr. Eulah Pont             Podiatry -    Foot center             Ortho [facility] for hairline fractures  Past Surgical History: Last updated: 07/01/2008 Aortic Valve Replacement: '00 Carpal tunnel release '89 Cholecystectomy '90 Hysterectomy '85-fibroids Oophorectomy '87 Ulnar nerve release '89   G4P2  2 SAB FH reviewed for relevance, SH/Risk Factors reviewed for relevance  Review of Systems       The patient complains of hematuria and abnormal bleeding.  The patient denies anorexia, fever, weight loss, chest pain, syncope, dyspnea on exertion, prolonged cough, headaches, abdominal pain, severe indigestion/heartburn, genital sores, suspicious skin lesions, enlarged lymph nodes, and angioedema.    Physical Exam  General:  WNWD white female in no acute distress Head:  normocephalic and atraumatic.   Eyes:  pupils equal and pupils round.  C&S  clear Neck:  supple.   Lungs:  normal respiratory effort.   Heart:  normal rate and regular rhythm.   Abdomen:  right flank pain with percussion. Tender int he RLQ and supra-pubic area to deep palpation. Pulses:  2+ radial Neurologic:  alert & oriented X3, cranial nerves II-XII intact, and gait normal.   Skin:  turgor normal, color normal, and no suspicious lesions.   Cervical Nodes:  no anterior cervical adenopathy and no posterior cervical adenopathy.   Psych:  Oriented X3, normally interactive, good eye contact, and not anxious appearing.     Impression & Recommendations:  Problem # 1:  FLANK PAIN, RIGHT (ICD-789.09) Patient with flank pain, right LLQ abdeominal pain and hematuria all suspicious for nephrolithiasis. No fever, dysuria or urinary frequency to suggest UTI.  Plan - CT kidney stone protocol for this day.  Her updated medication list for this problem includes:    Cyclobenzaprine Hcl 10 Mg Tabs (Cyclobenzaprine hcl) .Marland Kitchen... 1 at bedtime    Norco 5-325 Mg Tabs (Hydrocodone-acetaminophen) .Marland Kitchen... 1 by mouth q6 as needed chest wall pain  Orders: Radiology Referral (Radiology)  Addendum- CT kidney stone protocol was negative.                     U/A was positive.   Tests: (2) UDip w/Micro (URINE)   Color                     LT. YELLOW       RANGE:  Yellow;Lt. Yellow   Clarity                   CLEAR                       Clear   Specific Gravity          1.025                       1.000 - 1.030   Urine Ph                  6.0                         5.0-8.0   Protein  NEGATIVE                    Negative   Urine Glucose             NEGATIVE                    Negative   Ketones                   NEGATIVE                    Negative   Urine Bilirubin           NEGATIVE                    Negative   Blood                     NEGATIVE                    Negative   Urobilinogen              1.0                         0.0 - 1.0   Leukocyte Esterace         TRACE                       Negative   Nitrite                   NEGATIVE                    Negative   Urine WBC                 7-10/hpf                    0-2/hpf  Plan - septra DS two times a day x 7  Complete Medication List: 1)  Vitamin C 500 Mg Tabs (Ascorbic acid) .... Take 1 tablet by mouth once a day 2)  Vitamin D 1000 Unit Tabs (Cholecalciferol) .... Take 1 tablet by mouth once a day 3)  Calcium 600 Mg Tabs (Calcium) .... Take 1 tablet by mouth once a day 4)  Xalatan 0.005 % Soln (Latanoprost) .... One drop each eye at bedtime 5)  Alphagan P 0.1 % Soln (Brimonidine tartrate) .... One drop each eye two times a day 6)  Warfarin Sodium 4 Mg Tabs (Warfarin sodium) .... 4mg  m,w,f and 4 1/2mg  t,th,sat, sun 7)  Klor-con M10 10 Meq Cr-tabs (Potassium chloride crys cr) .... daily 8)  Metoprolol Tartrate 25 Mg Tabs (Metoprolol tartrate) .... Take 2 tablet by mouth once a day 9)  Torsemide 20 Mg Tabs (Torsemide) .Marland Kitchen.. 1 by mouth two times a day 10)  Celexa 20 Mg Tabs (Citalopram hydrobromide) .... Take 1 tablet by mouth once a day 11)  Simvastatin 40 Mg Tabs (Simvastatin) .... Take 1 tablet by mouth once a day 12)  Folic Acid 1 Mg Tabs (Folic acid) .Marland Kitchen.. 1 by mouth once daily 13)  Cyclobenzaprine Hcl 10 Mg Tabs (Cyclobenzaprine hcl) .Marland Kitchen.. 1 at bedtime 14)  Gabapentin 300 Mg Caps (Gabapentin) .Marland Kitchen.. 1 by mouth at bedtime 15)  Meclizine Hcl 12.5 Mg Tabs (Meclizine hcl) .Marland Kitchen.. 1 by mouth q8 as needed for labyrinthitis 16)  Norco 5-325  Mg Tabs (Hydrocodone-acetaminophen) .Marland Kitchen.. 1 by mouth q6 as needed chest wall pain 17)  Sulfamethoxazole-tmp Ds 800-160 Mg Tabs (Sulfamethoxazole-trimethoprim) .Marland Kitchen.. 1 by mouth two times a day x 7 for uti   CT ABD/PELVIS WO CM - [id number]   Clinical Data: Right lower quadrant and flank pain, hematuria   CT ABDOMEN AND PELVIS WITHOUT CONTRAST   Technique:  Multidetector CT imaging of the abdomen and pelvis was performed following the standard protocol without  intravenous contrast.   Comparison: None.   Findings: Single small noncalcified nodules are present at the lung bases.  This may represent changes of prior granulomatous disease. There does appear to be a small hiatal hernia present.  The liver is low in attenuation consistent with fatty infiltration.  No focal abnormality is seen.  Surgical clips are present from prior cholecystectomy.  The pancreas is normal in size and the pancreatic duct is not dilated.  The adrenal glands and spleen are unremarkable.  No renal calculi as noted and there is no evidence of hydronephrosis.  The abdominal aorta is normal in caliber.   The appendix is faintly visualized within the right lower quadrant and appears normal, as is the terminal ileum.  The urinary bladder is unremarkable.  No distal ureteral calculi are seen.  The uterus has previously been resected.  No fluid is noted within the pelvis. No acute bony abnormality is seen.   IMPRESSION:   1.  No renal calculi.  No hydronephrosis. 2.  The appendix and terminal ileum appear normal. 3.  Diffuse fatty infiltration of the liver. 4.  Small noncalcified nodules at the lung bases most likely represent granulomas.  Consider CT of the chest if warranted clinically.Prescriptions: SULFAMETHOXAZOLE-TMP DS 800-160 MG TABS (SULFAMETHOXAZOLE-TRIMETHOPRIM) 1 by mouth two times a day x 7 for uti  #14 x 0   Entered and Authorized by:   Jacques Navy MD   Signed by:   Jacques Navy MD on 05/03/2010   Method used:   Telephoned to ...       [facility] [address] [phone]* (retail)       [address]       [place], [place]  [address]       Ph: [phone]       Fax: [phone]   RxID:   [id number] NORCO 5-325 MG TABS (HYDROCODONE-ACETAMINOPHEN) 1 by mouth q6 as needed chest wall pain  #30 x 1   Entered and Authorized by:   Jacques Navy MD   Signed by:   Jacques Navy MD on 05/01/2010   Method used:    Handwritten   RxID:   [id number] TAMSULOSIN HCL 0.4 MG CAPS (TAMSULOSIN HCL) 1 by mouth once daily to help pass kidney stone.  #15 x 1   Entered and Authorized by:   Jacques Navy MD   Signed by:   Jacques Navy MD on 05/01/2010   Method used:   Electronically to        [facility] [phone]* (retail)       [address]       [place], [place]  [address]       Ph: [phone]       Fax: [phone]   RxID:   [id number]    Orders Added: 1)  Radiology Referral [Radiology] 2)  Est. Patient Level IV [376[address]]

## 2010-06-25 NOTE — Assessment & Plan Note (Signed)
Summary: rectal bleeding/SD   Vital Signs:  Patient profile:   65 year old female Height:      62 inches Weight:      208 pounds BMI:     38.18 O2 Sat:      97 % on Room air Temp:     97.4 degrees F oral Pulse rate:   72 / minute BP supine:   122 / 58  (left arm) BP sitting:   122 / 62  (left arm) BP standing:   128 / 60  (left arm) Cuff size:   regular  Vitals Entered By: Bill Salinas CMA (May 08, 2010 4:02 PM)  O2 Flow:  Room air CC: pt here with c/o rectal bleeding x 1 week/ ab   Primary Care Provider:  Jacques Navy MD  CC:  pt here with c/o rectal bleeding x 1 week/ ab.  History of Present Illness: Patient seen acutely for hematochezia. She was recently seen for ED follow-up for hematuria. She had called to report hematochezia with BM  that was fairly brisk. She also reports small amount of bleeding when not on commode. She denied any rectal pain or irritation, no abdominal pain, no fevers. Her BMs were with frank blood and specifically blood mixed in with the stool. She has not felt feint, been tachycardia or had any chest pain. Earlier today lab revealed a Hgb 12.2 grams and INR 4.0. Last colonoscopy was approximately 6 years ago and was normal (not available in EMR)  Allergies: No Known Drug Allergies  Past History:  Past Medical History: Last updated: 07/01/2008 CORONARY ATHEROSCLEROSIS NATIVE CORONARY ARTERY (ICD-414.01) DM (ICD-250.00) OTHER AND UNSPECIFIED HYPERLIPIDEMIA (ICD-272.4) CHRONIC ANGLE-CLOSURE GLAUCOMA (ICD-365.23) AORTIC VALVE DISEASE (ICD-424.1)    Physician Roster:             Cardiology - Dr.Kelly             CVST -        Dr. Morton Peters             Ortho -        Dr. Teressa Senter             Opthal -       Dr. Eulah Pont             Podiatry -    Foot center             Ortho [facility] Ortho for hairline fractures  Past Surgical History: Last updated: 07/01/2008 Aortic Valve Replacement: '00 Carpal tunnel release  '89 Cholecystectomy '90 Hysterectomy '85-fibroids Oophorectomy '87 Ulnar nerve release '89   G4P2  2 SAB  Family History: Last updated: 07/01/2008 father- unkown mother - deceased @ 19: COPD, emphysema, colon cancer Neg - breast; DM  Social History: Last updated: 07/01/2008 HSG, 2 years of college married '67  1 son - '69; 1 daughter '71; 1 grandchild work: Lawyer and Engineer, maintenance Marriage is OK.  Review of Systems GI:  Complains of bloody stools and change in bowel habits; denies abdominal pain, constipation, dark tarry stools, diarrhea, excessive appetite, gas, hemorrhoids, indigestion, vomiting, and yellowish skin color.  Physical Exam  General:  Well-developed,well-nourished,in no acute distress; alert,appropriate and cooperative throughout examination Head:  Normocephalic and atraumatic without obvious abnormalities. No apparent alopecia or balding. Eyes:  C&S clear Neck:  supple.   Lungs:  normal respiratory effort.   Heart:  normal rate and regular rhythm.   Abdomen:  soft, non-tender, normal bowel sounds, and no guarding.   Rectal:  stable external skin tags at the rectum. Normal sphincter tone. Anoscopy - heme postive stool above the anoscope. No internal hemorrhoids or fissure observed.   Impression & Recommendations:  Problem # 1:  HEMATOCHEZIA (ICD-578.1)  Patient with hematochezia with no evidence of hemorrhoidal bleeding. Her bleeding is more pronounced due to her coagulopathy. Concern for origin of bleeding: diverticular vs poly or malignance.  Plan - refer to GI for colonoscopy  Orders: Gastroenterology Referral (GI) Anoscopy (16109)  Problem # 2:  GROSS HEMATURIA (ICD-599.71) She has continued to have gross hematuria - exacerbated by coagulopathy. She needs evaluation for origin of hematuria.  Plan continue with planned referral to urology.  Her updated medication list for this problem includes:    Sulfamethoxazole-tmp Ds 800-160 Mg Tabs  (Sulfamethoxazole-trimethoprim) .Marland Kitchen... 1 by mouth two times a day x 7 for uti  Problem # 3:  AORTIC VALVE DISEASE (ICD-424.1)  Patient on coumadin for prosthetic valve. Her INR today is too high at 4.0  Plan -  she is to hold warfarin Friday, Sat and Sunday and then resume her regular dosing           she will need to have recheck of INR at coag clinic Tuesday.  Her updated medication list for this problem includes:    Warfarin Sodium 4 Mg Tabs (Warfarin sodium) ..... 4mg  m,w,f and 4 1/2mg  t,th,sat, sun    Metoprolol Tartrate 25 Mg Tabs (Metoprolol tartrate) .Marland Kitchen... Take 2 tablet by mouth once a day  Orders: Alternative Medicine Referral (Alt Med) [facility] Coumadin Clinic Referral (Coumadin clinic)  Complete Medication List: 1)  Vitamin C 500 Mg Tabs (Ascorbic acid) .... Take 1 tablet by mouth once a day 2)  Vitamin D 1000 Unit Tabs (Cholecalciferol) .... Take 1 tablet by mouth once a day 3)  Calcium 600 Mg Tabs (Calcium) .... Take 1 tablet by mouth once a day 4)  Xalatan 0.005 % Soln (Latanoprost) .... One drop each eye at bedtime 5)  Alphagan P 0.1 % Soln (Brimonidine tartrate) .... One drop each eye two times a day 6)  Warfarin Sodium 4 Mg Tabs (Warfarin sodium) .... 4mg  m,w,f and 4 1/2mg  t,th,sat, sun 7)  Klor-con M10 10 Meq Cr-tabs (Potassium chloride crys cr) .... daily 8)  Metoprolol Tartrate 25 Mg Tabs (Metoprolol tartrate) .... Take 2 tablet by mouth once a day 9)  Torsemide 20 Mg Tabs (Torsemide) .Marland Kitchen.. 1 by mouth two times a day 10)  Celexa 20 Mg Tabs (Citalopram hydrobromide) .... Take 1 tablet by mouth once a day 11)  Simvastatin 40 Mg Tabs (Simvastatin) .... Take 1 tablet by mouth once a day 12)  Folic Acid 1 Mg Tabs (Folic acid) .Marland Kitchen.. 1 by mouth once daily 13)  Cyclobenzaprine Hcl 10 Mg Tabs (Cyclobenzaprine hcl) .Marland Kitchen.. 1 at bedtime 14)  Gabapentin 300 Mg Caps (Gabapentin) .Marland Kitchen.. 1 by mouth at bedtime 15)  Meclizine Hcl 12.5 Mg Tabs (Meclizine hcl) .Marland Kitchen.. 1 by mouth q8 as  needed for labyrinthitis 16)  Norco 5-325 Mg Tabs (Hydrocodone-acetaminophen) .Marland Kitchen.. 1 by mouth q6 as needed chest wall pain 17)  Sulfamethoxazole-tmp Ds 800-160 Mg Tabs (Sulfamethoxazole-trimethoprim) .Marland Kitchen.. 1 by mouth two times a day x 7 for uti   Orders Added: 1)  Gastroenterology Referral [GI] 2)  Alternative Medicine Referral [Alt Med] 3)  [facility] Referral [Coumadin clinic] 4)  Anoscopy [46600] 5)  Est. Patient Level III [60454]

## 2010-06-26 NOTE — Letter (Signed)
Summary: [facility]  [facility]   Imported By: Sherian Rein 10/01/2009 09:27:52  _____________________________________________________________________  External Attachment:    Type:   Image     Comment:   External Document

## 2010-07-07 ENCOUNTER — Encounter: Payer: Self-pay | Admitting: Internal Medicine

## 2010-07-07 ENCOUNTER — Telehealth: Payer: Self-pay | Admitting: Internal Medicine

## 2010-07-15 NOTE — Consult Note (Signed)
Summary: [facility]  [facility]   Imported By: Sherian Rein 07/10/2010 10:02:46  _____________________________________________________________________  External Attachment:    Type:   Image     Comment:   External Document

## 2010-07-15 NOTE — Progress Notes (Signed)
Summary: NOSE BLEED   Phone Note Call from Patient Call back at Work Phone [phone]   Summary of Call: Pt has had nosebleed since yesterday approx 1 pm. Constant dripping blood even thru the night. She is on coumadin and usualy get monitored by cardiologist. Last INR was here on 12/16 which was 4.0. Phone note from 12/19 has INR reported by patient being 2.5.    Initial call taken by: Lamar Sprinkles, CMA,  July 07, 2010 2:20 PM  Follow-up for Phone Call        ENT OR ER TODAY per MD. Man[facility] will see pt today, Pt informed  Follow-up by: Lamar Sprinkles, CMA,  July 07, 2010 2:56 PM

## 2010-08-05 LAB — POCT URINALYSIS DIPSTICK
Glucose, UA: NEGATIVE mg/dL
Nitrite: NEGATIVE
Protein, ur: 100 mg/dL — AB
Specific Gravity, Urine: >=1.03 (ref 1.005–1.030)
Urobilinogen, UA: 1 mg/dL (ref 0.0–1.0)
pH: 5 (ref 5.0–8.0)

## 2010-08-05 LAB — POCT I-STAT, CHEM 8
BUN: 20 mg/dL (ref 6–23)
Calcium, Ion: 1.09 mmol/L — ABNORMAL LOW (ref 1.12–1.32)
Chloride: 99 mEq/L (ref 96–112)
Creatinine, Ser: 1.5 mg/dL — ABNORMAL HIGH (ref 0.4–1.2)
Glucose, Bld: 137 mg/dL — ABNORMAL HIGH (ref 70–99)
HCT: 50 % — ABNORMAL HIGH (ref 36.0–46.0)
Hemoglobin: 17 g/dL — ABNORMAL HIGH (ref 12.0–15.0)
Potassium: 3.6 mEq/L (ref 3.5–5.1)
Sodium: 139 mEq/L (ref 135–145)
TCO2: 29 mmol/L (ref 0–100)

## 2010-09-01 ENCOUNTER — Other Ambulatory Visit: Payer: Self-pay | Admitting: Internal Medicine

## 2010-09-01 ENCOUNTER — Telehealth: Payer: Self-pay | Admitting: *Deleted

## 2010-09-01 ENCOUNTER — Other Ambulatory Visit: Payer: Self-pay

## 2010-09-01 ENCOUNTER — Other Ambulatory Visit (INDEPENDENT_AMBULATORY_CARE_PROVIDER_SITE_OTHER): Payer: Medicare Other

## 2010-09-01 DIAGNOSIS — R7309 Other abnormal glucose: Secondary | ICD-10-CM

## 2010-09-01 DIAGNOSIS — R739 Hyperglycemia, unspecified: Secondary | ICD-10-CM

## 2010-09-01 LAB — HEMOGLOBIN A1C: Hgb A1c MFr Bld: 7.9 % — ABNORMAL HIGH (ref 4.6–6.5)

## 2010-09-01 NOTE — Telephone Encounter (Signed)
Spoke w/Dr Norins about Pt coming in to get labs after return call. Ok per VO Dr Debby Bud Pt Informed.

## 2010-09-01 NOTE — Telephone Encounter (Signed)
Pt was seen for pre op for colonoscopy. Her CBG was too high to be scheduled and she must have further testing by PCP. Pt is anxious for these labs. OK for labs? Or do you want to see pt in the office also?

## 2010-09-03 ENCOUNTER — Telehealth: Payer: Self-pay | Admitting: Internal Medicine

## 2010-09-03 NOTE — Telephone Encounter (Signed)
A1C is above goal. Patient needs an office visit.

## 2010-09-04 ENCOUNTER — Other Ambulatory Visit: Payer: Self-pay | Admitting: Gastroenterology

## 2010-09-04 DIAGNOSIS — R7989 Other specified abnormal findings of blood chemistry: Secondary | ICD-10-CM

## 2010-09-04 NOTE — Telephone Encounter (Signed)
Informed pt she needed appt with Dr Debby Bud to discuss elevated A1C. Transferred pt to [facility] to set up appt.

## 2010-09-07 ENCOUNTER — Encounter: Payer: Self-pay | Admitting: Internal Medicine

## 2010-09-08 ENCOUNTER — Ambulatory Visit
Admission: RE | Admit: 2010-09-08 | Discharge: 2010-09-08 | Disposition: A | Discharge status: Home / Self Care | Payer: Medicare Other | Source: Ambulatory Visit | Attending: Gastroenterology | Admitting: Gastroenterology

## 2010-09-08 DIAGNOSIS — R7989 Other specified abnormal findings of blood chemistry: Secondary | ICD-10-CM

## 2010-09-09 ENCOUNTER — Ambulatory Visit (INDEPENDENT_AMBULATORY_CARE_PROVIDER_SITE_OTHER): Payer: Medicare Other | Admitting: Internal Medicine

## 2010-09-09 ENCOUNTER — Encounter: Payer: Self-pay | Admitting: Internal Medicine

## 2010-09-09 VITALS — BP 116/60 | HR 65 | Temp 98.5°F | Wt 207.0 lb

## 2010-09-09 DIAGNOSIS — E119 Type 2 diabetes mellitus without complications: Secondary | ICD-10-CM

## 2010-09-09 MED ORDER — TORSEMIDE 20 MG PO TABS: 20.0000 mg | ORAL_TABLET | Freq: Two times a day (BID) | ORAL | Status: DC

## 2010-09-09 MED ORDER — SIMVASTATIN 40 MG PO TABS: 40.0000 mg | ORAL_TABLET | Freq: Every day | ORAL | Status: DC

## 2010-09-09 MED ORDER — CITALOPRAM HYDROBROMIDE 20 MG PO TABS: 20.0000 mg | ORAL_TABLET | Freq: Every day | ORAL | Status: DC

## 2010-09-09 MED ORDER — MECLIZINE HCL 12.5 MG PO TABS: 12.5000 mg | ORAL_TABLET | Freq: Three times a day (TID) | ORAL | Status: DC | PRN

## 2010-09-09 MED ORDER — METFORMIN HCL 500 MG PO TABS: 500.0000 mg | ORAL_TABLET | Freq: Two times a day (BID) | ORAL | Status: DC

## 2010-09-09 MED ORDER — GABAPENTIN 300 MG PO CAPS: 300.0000 mg | ORAL_CAPSULE | Freq: Every day | ORAL | Status: DC

## 2010-09-09 MED ORDER — ZOLPIDEM TARTRATE 10 MG PO TABS: 10.0000 mg | ORAL_TABLET | Freq: Every evening | ORAL | Status: DC | PRN

## 2010-09-09 MED ORDER — METOPROLOL SUCCINATE ER 50 MG PO TB24: 50.0000 mg | ORAL_TABLET | Freq: Every day | ORAL | Status: DC

## 2010-09-09 MED ORDER — POTASSIUM CHLORIDE 10 MEQ PO TBCR: 10.0000 meq | EXTENDED_RELEASE_TABLET | Freq: Two times a day (BID) | ORAL | Status: DC

## 2010-09-09 NOTE — Patient Instructions (Signed)
Diabetes - you need to work on your life-style: no sugar, low carbs, exercise. Start metformin 500 mg once a day for one week then twice a day. Return for lab work in 3 months. Sleep - practice good sleep hygiene. May take ambien 10 mg at bedtime as needed. Your health is job #1 - take it as seriously as any job: get up and take care of business!!!!   Insomnia Insomnia is frequent trouble falling and/or staying asleep. Insomnia can be a long term problem or a short term problem. Both are common. Insomnia can be a short term problem when the wakefulness is related to a certain stress or worry. Long term insomnia is often related to ongoing stress during waking hours and/or poor sleeping habits. Overtime, sleep deprivation itself can make the problem worse. Every little thing feels more severe because you are overtired and your ability to cope is decreased. SYMPTOMS  Not feeling rested in the morning.   Anxiety and restlessness at bedtime.   Difficulty falling and staying asleep.  CAUSES  Stress, anxiety, and depression.   Poor sleeping habits.   Distractions such as TV in the bedroom.   Naps close to bedtime.   Engaging in emotionally charged conversations before bed.   Technical reading before sleep.   Alcohol and other sedatives. They may make the problem worse. They can hurt normal sleep patterns and normal dream activity.   Stimulants such as caffeine for several hours prior to bedtime.   Pain syndromes and shortness of breath can cause insomnia.   Exercise late at night.   Changing time zones may cause sleeping problems (jet lag).  It is sometimes helpful to have someone observe your sleeping patterns. They should look for periods of not breathing during the night (sleep apnea). They should also look to see how long those periods last. If you live alone or observers are uncertain, you can also be observed at a sleep clinic where your sleep patterns will be professionally  monitored. Sleep apnea requires a checkup and treatment. Give your caregivers your medical history. Give your caregivers observations your family has made about your sleep.   TREATMENT  Your caregiver may prescribe treatment for an underlying medical disorders. Your caregiver can give advice or help if you are using alcohol or other drugs for self-medication. Treatment of underlying problems will usually eliminate insomnia problems.   Medications can be prescribed for short time use. They are generally not recommended for lengthy use.   Over-the-counter sleep medicines are not recommended for lengthy use. They can be habit forming.   You can promote easier sleeping by making lifestyle changes such as:   Using relaxation techniques that help with breathing and reduce muscle tension.   Exercising earlier in the day.   Changing your diet and the time of your last meal. No night time snacks.   Establish a regular time to go to bed.   Counseling can help with stressful problems and worry.   Soothing music and white noise may be helpful if there are background noises you cannot remove.   Stop tedious detailed work at least one hour before bedtime.  HOME CARE INSTRUCTIONS  Keep a diary. Inform your caregiver about your progress. This includes any medication side effects. See your caregiver regularly. Take note of:   Times when you are asleep.   Times when you are awake during the night.      The quality of your sleep.   How you feel  the next day.      This information will help your caregiver care for you.  Get out of bed if you are still awake after 15 minutes. Read or do some quiet activity. Keep the lights down. Wait until you feel sleepy and go back to bed.   Keep regular sleeping and waking hours. Avoid naps.   Exercise regularly.   Avoid distractions at bedtime. Distractions include watching television or engaging in any intense or detailed activity like attempting to  balance the household checkbook.   Develop a bedtime ritual. Keep a familiar routine of bathing, brushing your teeth, climbing into bed at the same time each night, listening to soothing music. Routines increase the success of falling to sleep faster.   Use relaxation techniques. This can be using breathing and muscle tension release routines. It can also include visualizing peaceful scenes. You can also help control troubling or intruding thoughts by keeping your mind occupied with boring or repetitive thoughts like the old concept of counting sheep. You can make it more creative like imagining planting one beautiful flower after another in your backyard garden.   During your day, work to eliminate stress. When this is not possible use some of the previous suggestions to help reduce the anxiety that accompanies stressful situations.  MAKE SURE YOU:    Understand these instructions.   Will watch your condition.   Will get help right away if you are not doing well or get worse.  Document Released: 05/07/2000 Document Re-Released: 04/22/2008 [facility] Patient Information 2011 [place], [place].

## 2010-09-10 NOTE — Progress Notes (Signed)
  Subjective:    Patient ID: Abigail Oliver, female    DOB: May 13, 1946, 65 y.o.   MRN: [id number]  HPIPatient present for management of diabetes. She has had progressively rising A1C with the last A1C being definitely abnormal. She has multiple other co-morbidities and risk factors. She recently saw Dr. Aleen Campi who is working up her peripheral vascular disease. Discussed with her the viery high risk of stroke. She admits to dietary indiscretion in her dier - she is a stress eater choosing the wrong foods.   PMH, FamHx and SocHx reviewed for any changes and relevance.      Review of Systems  Constitutional: Negative.   HENT: Negative.   Eyes: Negative.   Respiratory: Negative.   Cardiovascular: Negative.   All other systems reviewed and are negative.       Objective:   Physical Exam  Constitutional:       Overweight white woman in no distress  Eyes: Conjunctivae and EOM are normal. Pupils are equal, round, and reactive to light.  Cardiovascular: Normal rate and regular rhythm.   Pulmonary/Chest: Effort normal and breath sounds normal.          Assessment & Plan:  1. Diabetes - reviewed the mechanism of disease, the importance of life style management: a no sugar diet, low caarbs, limited calories and exercise. She is adamantly encouraged to work on these factors. In addition she will be started on metformin 500mg  bid. Follow-up A1C in 3 months.   (greater than 50% of 20 min visit spent on education and counseling)

## 2010-09-14 ENCOUNTER — Other Ambulatory Visit: Payer: Self-pay

## 2010-09-16 ENCOUNTER — Ambulatory Visit: Payer: No Typology Code available for payment source | Admitting: Endocrinology

## 2010-09-16 DIAGNOSIS — Z0289 Encounter for other administrative examinations: Secondary | ICD-10-CM

## 2010-09-21 ENCOUNTER — Encounter: Payer: Self-pay | Admitting: Internal Medicine

## 2010-09-22 ENCOUNTER — Telehealth: Payer: Self-pay | Admitting: *Deleted

## 2010-09-22 NOTE — Telephone Encounter (Signed)
Patient informed. 

## 2010-09-22 NOTE — Telephone Encounter (Signed)
Unusual side effect for metformin. If mild and if no GI complaints recommend continuing with metformin a little longer.

## 2010-09-22 NOTE — Telephone Encounter (Signed)
Pt is taking metformin 500 mg bid and c/o dizziness when taking medication.

## 2010-10-07 ENCOUNTER — Encounter: Payer: Self-pay | Admitting: Internal Medicine

## 2010-10-09 NOTE — Discharge Summary (Signed)
[facility]  Patient:    Abigail Oliver, Abigail Oliver                    MRN: [id number] Adm. Date:  [id number] Disc. Date: 11/16/00 Attending:  Judie Petit CC:         Claretta Fraise, M.D.  Lennette Bihari, M.D.  Mikey Bussing, M.D.   Discharge Summary  HISTORY OF PRESENT ILLNESS:  The patient is a 65 year old woman with a history of aortic valve replacement.  She was feeling well until 10 days prior to admission when she developed stomach ache.  She had brain sensation and nausea.  Four days prior to admission, she developed myalgias, arthralgias and what appeared to be dermatitis on her chest.  She then describes an area of her finger that became red and sore.  She then had an area on the medial aspect of her left thigh, left elbow and left cheek that all appeared erythematous and tender.  Two days prior to admission, she developed chills and sweats as well as having increasing orthopnea.  Because of these symptoms, she presented and was admitted.  PAST MEDICAL HISTORY:  Well-documented in the note per Dr. Birdie Sons.  HOSPITAL COURSE:  The patient was admitted to telemetry unit.  Concern was for whether the patient had prosthetic valve endocarditis.  Blood cultures x 3 were drawn.  The patient was empirically started on vancomycin, gentamicin and had had one dose of Rifampin.  The patient was seen in consultation by [facility] and Vascular.  They shared the concern for possible prosthetic valve endocarditis.  The patient was seen in consultation by Dr. Darlina Sicilian from infectious disease.  The patients course was unremarkable.  She defervesced and remained afebrile through the last 36 hours of her admission.  The patients blood cultures were negative x 3.  Urine culture was negative.  It was felt that it was low probability for endocarditis given negative blood cultures.  It was felt the patient might have an accidental drug  reaction versus cellulitis.  The patient came for a TEE performed on the day of discharge which, by telephone report, was negative for any signs of vegetations.  With the patient having negative blood cultures and negative transesophageal echocardiogram, it was felt that she was stable and could be discharged home. It was recommended that she have a 14-day course of antibiotics to cover for possible cellulitis.  DISCHARGE PHYSICAL EXAMINATION:  VITAL SIGNS:  Temperature 98.2, blood pressure 120/55, O2 saturations 98% on room air.  CHEST:  Clear.  HEART:  Regular with no murmurs, rubs or gallops.  She has a well-healed sternotomy scar.  DERMATOLOGIC:  The patient has a subcutaneous nodule in the right proximal upper extremity.  She had an erythematous warm area to the medial aspect of the proximal left thigh with subcutaneous nodules.  DISCHARGE LABORATORY DATA AND X-RAY FINDINGS:  Blood cultures were negative x 3.  INR was 1.8.  BMET normal.  CBC normal with white count of 6400.  DISPOSITION:  Discharged to home.  DISCHARGE MEDICATIONS: 1. Tequin 400 mg q.d. x 14 days. 2. Continue on all home medications.  FOLLOWUP:  The patient should see Dr. Baldo Daub for followup.  CONDITION ON DISCHARGE:  Stable. DD:  11/16/00 TD:  11/17/00 Job: [id number]

## 2010-10-09 NOTE — H&P (Signed)
[facility]  Patient:    Abigail Oliver, Abigail Oliver                    MRN: [id number] Adm. Date:  [id number] Attending:  Judie Petit CC:         Claretta Fraise, M.D.  Lennette Bihari, M.D.  Mikey Bussing, M.D.   History and Physical  CHIEF COMPLAINT:  Fever.  HISTORY OF PRESENT ILLNESS:  Ms. Pelto is a 65 year old female with a complicated medical history.  She was feeling well until approximately 10 days ago.  She relates her symptoms to taking her first dose of Actonel approximately 10 days ago.  She states that at that time she had a "stomach ache."  She states she had a burning sensation of her stomach associated with some nausea.  There was no emesis.  She started to improve but then four days ago, she noted development of some myalgias, arthralgias and a "infection on my chest."  She describes this as a "boil," an area of redness.  This was shortly followed by a "red finger."  She describes some pain of finger around the nail.  She also then noted a red thigh, an area on her left cheek, an area on her right elbow.  All these areas are either tender or red or look "like a boil."  She then developed a fever approximately two days ago with chills and sweats.  She has also noted three days of increasing orthopnea.  She states she has orthopnea as a chronic problem and usually sleeps on two to three pillow, which she is still doing.  She states her breathing just seems a little worse.  She also has noted diffuse arthralgias of the hips, knees, shoulders, elbows.  It is worth noting that her abdominal discomfort is getting better, although she still feels somewhat nauseated.  She denies any other complaints in her review of systems.  PAST MEDICAL HISTORY:  Significant for an aortic valve replacement (St. Jude); she also had a one-vessel bypass at that time; this was done in August of 2000.  Apparently, the valve may have some relationship  to taking Fen-Phen in the past.  She also has a history of glaucoma for which she has had some sort of surgery.  She has had a cholecystectomy, carpal tunnel release surgery. She has also had a hysterectomy and a tonsillectomy and adenoidectomy.  CURRENT MEDICATIONS:  1. Xalatan one drop OU q.d.  2. Coumadin 2.5 mg p.o. q.d. except for Tuesday and Thursday, she takes 5 mg.  3. She takes K-Dur 20 mEq p.o. q.d.  4. Folic acid 1 mg p.o. q.d.  5. Demadex 20 mg p.o. q.d.  6. Climara patch 0.025 mg.  7. She takes an Albuterol MDI p.r.n. (usually once every 7 to 10 days).  8. She takes Cardizem 120 mg p.o. q.d.  9. Ambien 10 mg p.o. q.h.s. p.r.n. (rarely). 10. She takes Rhinocort one puff each nostril q.d. 11. Allegra 50 mg b.i.d. 12. Calcium 1500 mg p.o. q.d.  SOCIAL HISTORY:  She is married.  FAMILY HISTORY:  There is no history of endocarditis.  She is a current nonsmoker.  PHYSICAL EXAMINATION:  VITAL SIGNS:  Temperature 103, pulse 121, respirations 20, blood pressure 135/71.  GENERAL:  She appears as a mildly ill female in no acute distress.  HEENT:  Atraumatic, normocephalic.  Extraocular muscles are intact.  She has top dentures.  Bottom teeth with multiple  fillings.  Oropharynx is moist. Conjunctivae are pink.  I do not see any hemorrhages.  NECK:  Supple without any lymphadenopathy, thyromegaly, jugular venous distention or carotid bruits.  CHEST:  Clear to auscultation without any dullness to percussion.  CARDIAC:  PMI is not palpable.  She has a 2-3/6 systolic ejection murmur with mechanical heart sounds.  ABDOMEN:  Active bowel sounds, soft, nontender.  There is no hepatosplenomegaly.  No masses are palpated.  EXTREMITIES:  There is no clubbing, cyanosis, or edema.  Peripheral pulses are normal and full.  NEUROLOGIC:  Normal.  PELVIC:  Not performed.  DERMATOLOGIC:  She has a large area of erythema surrounding a central lesion in her mid-proximal/medial  thigh.  Area of erythema is approximately 8 cm. There is a central area of induration measuring approximately 2 cm.  She also has a small area of erythema on her chest wall.  She has what looks like a paronychia on her right fifth finger.  She has what looks like a splinter hemorrhage on her right fifth toe; this is subungual.  LABORATORY DATA:  Urinalysis is unremarkable.  Sedimentation rate is 45.  WBC 11.1, hematocrit 35.5, hemoglobin 12.1, platelet count 256,000.  Absolute granulocytes mildly elevated at 8.5.  CMET is normal except for a total protein of 8.2, ALT is 47 and total bilirubin is 1.4.  Chest x-ray demonstrates an aortic valve replacement but no other significant abnormalities.  EKG demonstrates normal sinus rhythm, possible inferior MI; voltage in aVF is low.  There are some minimal ST and T wave changes.  ASSESSMENT AND PLAN:  Fever, unknown cause.  Given her history, I have heightened concern for the probability of prosthetic valve endocarditis.  She has had three blood cultures in the emergency room and I think she needs empiric treatment for prosthetic valve endocarditis until proven otherwise. We will start vancomycin, gentamicin and rifampin.  Depending on the results of the blood culture, she may need further evaluation with transesophageal echocardiography or echocardiogram.  Clinically, she looks fine now but she understands that if she does have endocarditis, it can be a very serious, even life-threatening medical problem.  It is worth noting that approximately two months ago, she was treated for a pneumonia but got back to "normal" after the pneumonia.  She cannot remember the antibiotic she took at that time.  She has not had any sort of dental cleaning, dental work or any other invasive manipulation within the last one month. DD:  11/12/00 TD:  11/12/00  Job: [id number]

## 2010-10-09 NOTE — Procedures (Signed)
[facility]  Patient:    Abigail Oliver, Abigail Oliver Visit Number: [id number] MRN: [id number]          Service Type: END Location: ENDO Attending Physician:  Charna Elizabeth Dictated by:   Anselmo Rod, M.D. Proc. Date: 11/02/01 Admit Date:  11/02/2001 Discharge Date: 11/02/2001   CC:         Lennette Bihari, M.D.  Kern Reap, M.D.   Procedure Report  DATE OF BIRTH:  Mar 31, 1946  PROCEDURES PERFORMED:  Screening colonoscopy.  ENDOSCOPIST:  Anselmo Rod, M.D.  INSTRUMENTS USED:  Pediatric adjustable Olympus colonoscope.  INDICATIONS FOR PROCEDURE:  The patient is a 65 year old white female with a history of aortic valve replacement, undergoing a screening colonoscopy. The patients mother has a history of colon cancer; rule out colonic polyps, masses, hemorrhoids, etc.  PREPROCEDURE PREPARATION: Informed consent was procured from the patient and the patient was fasted for eight hours prior to the procedure and prepped with a bottle of magnesium citrate and a gallon of NuLytely the night prior to the proceduree. The patients Coumadin was stopped five days prior to the procedur and the patient was switched to Lovenox as per Dr. Ellin Goodie recommendations.  PREPROCEDURE PHYSICAL: The patient had stable vital signs. Neck was supple. Chest was clear to auscultation. S1, S2 regular, murmur present from an aortic valve. Abdomen obese, nontender with normal bowel sounds.  DESCRIPTION OF THE PROCEDURE: The patient was placed in the left lateral decubitus position and sedated with 50 mg of Demerol and 5 mg of Versed intravenously. Once the patient was adequately sedated and maintained on low flow oxygen and continuous cardiac monitoring, the Olympus video colonoscope was advanced from the rectum to the cecum without difficulty.  There was some streaking or erythema seen in the right colon which may have been from pressure application on the left  colon to advance the scope. Small nonbleeding external and internal hemorrhoids were seen. No masses, polyps, or diverticulosis was present.  IMPRESSION: 1. No masses or polyps present, no evidence of diverticular disease. 2. Small    nonbleeding internal and external hemorrhoids. 3. Some streaking of the mucosa on the right colon, questionable from    application of pressure on the left colon.  RECOMMENDATIONS 1. Repeat colorectal cancer screening as recommended in the next 5 years    unless the patient develops any abnormal symptoms in the interim. 2. Restart Coumadin, Lovenox tonight, and stop the Lovenox after 5 days as    recommended by Dr. Nicki Guadalajara. 3. Outpatient follow up on a p.r.n. basis. Dictated by:   Anselmo Rod, M.D. Attending Physician:  Charna Elizabeth DD:  11/02/01 TD:  11/05/01 Job: [id number]

## 2010-10-12 ENCOUNTER — Encounter: Payer: Self-pay | Admitting: Internal Medicine

## 2010-11-02 ENCOUNTER — Encounter: Payer: Self-pay | Admitting: Internal Medicine

## 2010-11-22 ENCOUNTER — Other Ambulatory Visit: Payer: Self-pay | Admitting: Internal Medicine

## 2010-11-23 NOTE — Telephone Encounter (Signed)
Please Advise refills on medications

## 2010-11-24 NOTE — Telephone Encounter (Signed)
Ok for refills, zolpidem x 5, other prn

## 2010-11-26 NOTE — Telephone Encounter (Signed)
RXs [2] phoned in.

## 2010-12-02 ENCOUNTER — Telehealth: Payer: Self-pay | Admitting: *Deleted

## 2010-12-02 MED ORDER — CYCLOBENZAPRINE HCL 10 MG PO TABS: 5.0000 mg | ORAL_TABLET | Freq: Three times a day (TID) | ORAL | Status: DC | PRN

## 2010-12-02 NOTE — Telephone Encounter (Signed)
Ok for refill  x3 

## 2010-12-02 NOTE — Telephone Encounter (Signed)
Done

## 2010-12-02 NOTE — Telephone Encounter (Signed)
Fax from [employer] on [address] for Flexeril 10 take one tablet by mouth at bedtime. QTY 90 please Advise refill

## 2010-12-07 LAB — HM DIABETES EYE EXAM

## 2010-12-10 ENCOUNTER — Encounter: Payer: Self-pay | Admitting: Internal Medicine

## 2010-12-16 ENCOUNTER — Telehealth: Payer: Self-pay | Admitting: *Deleted

## 2010-12-16 NOTE — Telephone Encounter (Signed)
Left pt vm on cell

## 2010-12-16 NOTE — Telephone Encounter (Signed)
Patient requesting to know if MD agrees with lap band surgery?

## 2010-12-16 NOTE — Telephone Encounter (Signed)
Yes, ai agree with lap band surgery for medical reasons.

## 2011-01-07 ENCOUNTER — Encounter: Payer: Self-pay | Admitting: Internal Medicine

## 2011-01-29 ENCOUNTER — Encounter: Payer: Self-pay | Admitting: Internal Medicine

## 2011-01-29 ENCOUNTER — Ambulatory Visit (INDEPENDENT_AMBULATORY_CARE_PROVIDER_SITE_OTHER): Payer: Medicare Other | Admitting: Internal Medicine

## 2011-01-29 VITALS — BP 120/62 | HR 76 | Temp 97.8°F | Ht 62.0 in | Wt 197.4 lb

## 2011-01-29 DIAGNOSIS — E119 Type 2 diabetes mellitus without complications: Secondary | ICD-10-CM

## 2011-01-29 DIAGNOSIS — B379 Candidiasis, unspecified: Secondary | ICD-10-CM

## 2011-01-29 DIAGNOSIS — R6889 Other general symptoms and signs: Secondary | ICD-10-CM

## 2011-01-29 DIAGNOSIS — T679XXA Effect of heat and light, unspecified, initial encounter: Secondary | ICD-10-CM

## 2011-01-29 DIAGNOSIS — B372 Candidiasis of skin and nail: Secondary | ICD-10-CM | POA: Insufficient documentation

## 2011-01-29 MED ORDER — NYSTATIN 100000 UNIT/GM EX POWD: CUTANEOUS | Status: DC

## 2011-01-29 NOTE — Patient Instructions (Signed)
Take all new medications as prescribed Continue all other medications as before  

## 2011-01-30 ENCOUNTER — Encounter: Payer: Self-pay | Admitting: Internal Medicine

## 2011-01-30 MED ORDER — POTASSIUM CHLORIDE 10 MEQ PO TBCR: 10.0000 meq | EXTENDED_RELEASE_TABLET | Freq: Every day | ORAL | Status: DC

## 2011-01-30 NOTE — Progress Notes (Signed)
Subjective:    Patient ID: Abigail Oliver, female    DOB: 11-Jul-1945, 65 y.o.   MRN: [id number]  HPI  Here with new onset 3 days rash with itching and mild soreness at best that seems to recur below the breasts (not active now) but also now to the left inguinal ligament, groin and prox ant thigh area per pt where her pannus occurs and she has sweat much lately without being able to change clothes or otherwise keep dry.  No fever, little tender to touch, no drainage, and a friends lotrisone cream has seemed to stop the worsening in the last day, but just not getting better otherwise.  No prior hx of this kind of rash,  Pt denies fever, wt loss, night sweats, loss of appetite, or other constitutional symptoms, and no significant other itch, swelling including tongue no sob.  Pt denies chest pain, increased sob or doe, wheezing, orthopnea, PND, increased LE swelling, palpitations, dizziness or syncope.  Pt denies new neurological symptoms such as new headache, or facial or extremity weakness or numbness   Pt denies polydipsia, polyuria, or low sugar symptoms such as weakness or confusion improved with po intake.  Pt states overall good compliance with meds, trying to follow lower cholesterol, diabetic diet, wt overall stable but little exercise however.   Specifically mentions c/o general heat intolerance compared to other persons and reqeusts TSH.  Denies hyper or hypo thyroid symptoms such as voice, skin or hair change.  Past Medical History  Diagnosis Date  . Coronary atherosclerosis of native coronary artery   . Diabetes mellitus   . Hyperlipidemia   . Chronic angle-closure glaucoma   . Aortic valve disorders    Past Surgical History  Procedure Date  . Aortic valve replacement 2000  . Carpal tunnel release 1989  . Cholecystectomy 1990  . Abdominal hysterectomy 1985    Fibroids  . Oophorectomy 1987  . Ulnar nerve release 1989    reports that she has never smoked. She does not have any  smokeless tobacco history on file. Her alcohol and drug histories not on file. family history includes COPD in her mother; Cancer in her mother; and Emphysema in her mother.  There is no history of Diabetes. No Known Allergies Current Outpatient Prescriptions on File Prior to Visit  Medication Sig Dispense Refill  . Ascorbic Acid (VITAMIN C) 500 MG tablet Take 500 mg by mouth daily.        . brimonidine (ALPHAGAN P) 0.1 % SOLN Place 1 drop into both eyes 2 (two) times daily.        . calcium carbonate (OS-CAL) 600 MG TABS Take 600 mg by mouth 2 (two) times daily with a meal.        . Cholecalciferol (VITAMIN D) 1000 UNITS capsule Take 1,000 Units by mouth daily.        . citalopram (CELEXA) 20 MG tablet Take 1 tablet (20 mg total) by mouth daily.  30 tablet  6  . cyclobenzaprine (FLEXERIL) 10 MG tablet Take 0.5 tablets (5 mg total) by mouth 3 (three) times daily as needed for muscle spasms.  90 tablet  3  . folic acid (FOLVITE) 1 MG tablet Take 1 mg by mouth daily.        Marland Kitchen gabapentin (NEURONTIN) 300 MG capsule Take 1 capsule (300 mg total) by mouth at bedtime.  30 capsule  3  . latanoprost (XALATAN) 0.005 % ophthalmic solution Place 1 drop into both eyes at bedtime.        Marland Kitchen  meclizine (ANTIVERT) 12.5 MG tablet Take 1 tablet (12.5 mg total) by mouth every 8 (eight) hours as needed.  30 tablet  6  . metFORMIN (GLUCOPHAGE) 500 MG tablet Take 1 tablet (500 mg total) by mouth 2 (two) times daily with a meal.  60 tablet  11  . metoprolol (TOPROL-XL) 50 MG 24 hr tablet Take 1 tablet (50 mg total) by mouth daily.  30 tablet  6  . potassium chloride (KLOR-CON 10) 10 MEQ CR tablet Take 1 tablet (10 mEq total) by mouth 2 (two) times daily.  60 tablet  3  . simvastatin (ZOCOR) 40 MG tablet Take 1 tablet (40 mg total) by mouth at bedtime.  90 tablet  3  . torsemide (DEMADEX) 20 MG tablet Take 1 tablet (20 mg total) by mouth 2 (two) times daily.  60 tablet  3  . warfarin (COUMADIN) 4 MG tablet Take 4 mg by  mouth daily.        Marland Kitchen zolpidem (AMBIEN) 10 MG tablet TAKE ONE TABLET BY MOUTH AT BEDTIME AS NEEDED FOR SLEEP  30 tablet  5   Review of Systems Review of Systems  Constitutional: Negative for diaphoresis and unexpected weight change.  HENT: Negative for drooling and tinnitus.   Eyes: Negative for photophobia and visual disturbance.  Respiratory: Negative for choking and stridor.   Gastrointestinal: Negative for vomiting and blood in stool.  Genitourinary: Negative for hematuria and decreased urine volume.        Objective:   Physical Exam BP 120/62  Pulse 76  Temp(Src) 97.8 F (36.6 C) (Oral)  Ht 5\' 2"  (1.575 m)  Wt 197 lb 6 oz (89.529 kg)  BMI 36.10 kg/m2  SpO2 95% Physical Exam  VS noted, not ill appearing Constitutional: Pt appears well-developed and well-nourished.  HENT: Head: Normocephalic.  Right Ear: External ear normal.  Left Ear: External ear normal.  Eyes: Conjunctivae and EOM are normal. Pupils are equal, round, and reactive to light.  Neck: Normal range of motion. Neck supple.  Cardiovascular: Normal rate and regular rhythm.   Pulmonary/Chest: Effort normal and breath sounds normal.  Abd:  Soft, NT, non-distended, + BS Neurological: Pt is alert. No cranial nerve deficit.  Skin: Skin is warm. No erythema. except for extensive plaquelike erythema in 10x 4 cm area left prox anterior thigh,inguinal area covered by the pannus, non wet, no drainage, nontender Psychiatric: Pt behavior is normal. Thought content normal. 1+ nervous          Assessment & Plan:

## 2011-01-30 NOTE — Assessment & Plan Note (Signed)
etilogy unclear, ? Anxiety and obesity related;  For TSH per pt reqeust

## 2011-01-30 NOTE — Assessment & Plan Note (Signed)
stable overall by hx and exam, most recent data reviewed with pt, and pt to continue medical treatment as before  Lab Results  Component Value Date   HGBA1C 7.9* 09/01/2010

## 2011-01-30 NOTE — Assessment & Plan Note (Signed)
Mild to mod, for nystatin powder asd,  to f/u any worsening symptoms or concerns

## 2011-02-23 ENCOUNTER — Telehealth: Payer: Self-pay | Admitting: *Deleted

## 2011-02-23 MED ORDER — SULFAMETHOXAZOLE-TRIMETHOPRIM 800-160 MG PO TABS: 1.0000 | ORAL_TABLET | Freq: Two times a day (BID) | ORAL | Status: AC

## 2011-02-23 NOTE — Telephone Encounter (Signed)
Septra DS bid x 10 days. Needs OV either tonight or in the AM!

## 2011-02-23 NOTE — Telephone Encounter (Signed)
Spoke w/patient - She has a top denture that is held on w/her two "eye" teeth and two molars. All these teeth have fallen out and it is only held on by a dental post. This has caused significant irritation to her gums. She spoke w/her dentist who says she needs an antibiotic but she must call her PCP. Pt c/o redness, swelling and pain in gums, cheek extending to her eye. Please advise.

## 2011-02-23 NOTE — Telephone Encounter (Signed)
Spoke w/pt, RX sent in. Explained importance of being seen by MD.

## 2011-02-24 ENCOUNTER — Ambulatory Visit: Payer: Medicare Other | Admitting: Internal Medicine

## 2011-04-17 ENCOUNTER — Emergency Department (INDEPENDENT_AMBULATORY_CARE_PROVIDER_SITE_OTHER)
Admission: EM | Admit: 2011-04-17 | Discharge: 2011-04-17 | Disposition: A | Discharge status: Home / Self Care | Payer: Medicare Other | Source: Home / Self Care | Attending: Family Medicine | Admitting: Family Medicine

## 2011-04-17 ENCOUNTER — Encounter (HOSPITAL_COMMUNITY): Payer: Self-pay | Admitting: Emergency Medicine

## 2011-04-17 ENCOUNTER — Other Ambulatory Visit: Payer: Self-pay

## 2011-04-17 DIAGNOSIS — R42 Dizziness and giddiness: Secondary | ICD-10-CM

## 2011-04-17 LAB — POCT I-STAT, CHEM 8
BUN: 18 mg/dL (ref 6–23)
Calcium, Ion: 1.19 mmol/L (ref 1.12–1.32)
Chloride: 102 mEq/L (ref 96–112)
Creatinine, Ser: 0.9 mg/dL (ref 0.50–1.10)
Glucose, Bld: 97 mg/dL (ref 70–99)
HCT: 41 % (ref 36.0–46.0)
Hemoglobin: 13.9 g/dL (ref 12.0–15.0)
Potassium: 4.2 mEq/L (ref 3.5–5.1)
Sodium: 138 mEq/L (ref 135–145)
TCO2: 26 mmol/L (ref 0–100)

## 2011-04-17 LAB — POCT URINALYSIS DIP (DEVICE)
Glucose, UA: NEGATIVE mg/dL
Hgb urine dipstick: NEGATIVE
Ketones, ur: NEGATIVE mg/dL
Nitrite: NEGATIVE
Protein, ur: 30 mg/dL — AB
Specific Gravity, Urine: 1.02 (ref 1.005–1.030)
Urobilinogen, UA: 0.2 mg/dL (ref 0.0–1.0)
pH: 5.5 (ref 5.0–8.0)

## 2011-04-17 LAB — PROTIME-INR
INR: 2.36 — ABNORMAL HIGH (ref 0.00–1.49)
Prothrombin Time: 26.2 seconds — ABNORMAL HIGH (ref 11.6–15.2)

## 2011-04-17 LAB — CBC
HCT: 39.7 % (ref 36.0–46.0)
Hemoglobin: 13.2 g/dL (ref 12.0–15.0)
MCH: 29.7 pg (ref 26.0–34.0)
MCHC: 33.2 g/dL (ref 30.0–36.0)
MCV: 89.2 fL (ref 78.0–100.0)
Platelets: 174 10*3/uL (ref 150–400)
RBC: 4.45 MIL/uL (ref 3.87–5.11)
RDW: 14.8 % (ref 11.5–15.5)
WBC: 7.4 10*3/uL (ref 4.0–10.5)

## 2011-04-17 NOTE — ED Provider Notes (Signed)
History     CSN: [id number] Arrival date & time: 04/17/2011 12:11 PM   First MD Initiated Contact with Patient 04/17/11 1218      Chief Complaint  Patient presents with  . Dizziness    (Consider location/radiation/quality/duration/timing/severity/associated sxs/prior treatment) HPI Comments: Nichol presents for evaluation of intermittent dizziness and unsteady gait, over the last 3 weeks, but worse over the last 2 days. She denies any new medicines, no new exposures and no new change in activity. She does have neuropathy, a ? Dx of DM over the last 6 months for which she takes metformin. She also has a prosthetic heart valve and had a normal cardiac evaluation in August.   Patient is a 65 y.o. female presenting with neurologic complaint. The history is provided by the patient and the spouse.  Neurologic Problem The primary symptoms include dizziness. Primary symptoms do not include syncope, loss of consciousness, paresthesias, loss of sensation, fever, nausea or vomiting. The symptoms began more than 1 week ago. The symptoms are waxing and waning.  Dizziness does not occur with tinnitus, nausea, vomiting or weakness.  Additional symptoms do not include weakness, pain, photophobia, hearing loss, tinnitus or vertigo. Medical issues do not include seizures.    Past Medical History  Diagnosis Date  . Coronary atherosclerosis of native coronary artery   . Diabetes mellitus   . Hyperlipidemia   . Chronic angle-closure glaucoma   . Aortic valve disorders     Past Surgical History  Procedure Date  . Aortic valve replacement 2000  . Carpal tunnel release 1989  . Cholecystectomy 1990  . Abdominal hysterectomy 1985    Fibroids  . Oophorectomy 1987  . Ulnar nerve release 1989    Family History  Problem Relation Age of Onset  . Cancer Mother     Colon  . COPD Mother   . Emphysema Mother   . Diabetes Neg Hx     History  Substance Use Topics  . Smoking status: Never Smoker     . Smokeless tobacco: Not on file  . Alcohol Use: No    OB History    Grav Para Term Preterm Abortions TAB SAB Ect Mult Living                  Review of Systems  Constitutional: Negative.  Negative for fever.  HENT: Negative for hearing loss, sore throat and tinnitus.   Eyes: Negative for photophobia.  Respiratory: Negative.   Cardiovascular: Negative.  Negative for syncope.  Gastrointestinal: Negative.  Negative for nausea and vomiting.  Genitourinary: Negative.   Musculoskeletal: Positive for gait problem.  Skin: Negative.   Neurological: Positive for dizziness and light-headedness. Negative for vertigo, loss of consciousness, syncope, weakness and paresthesias.    Allergies  Review of patient's allergies indicates no known allergies.  Home Medications   Current Outpatient Rx  Name Route Sig Dispense Refill  . VITAMIN C 500 MG PO TABS Oral Take 500 mg by mouth daily.      Marland Kitchen BRIMONIDINE TARTRATE 0.1 % OP SOLN Both Eyes Place 1 drop into both eyes 2 (two) times daily.      Marland Kitchen CALCIUM CARBONATE 600 MG PO TABS Oral Take 600 mg by mouth 2 (two) times daily with a meal.      . VITAMIN D 1000 UNITS PO CAPS Oral Take 1,000 Units by mouth daily.      Marland Kitchen CITALOPRAM HYDROBROMIDE 20 MG PO TABS Oral Take 1 tablet (20 mg total) by mouth  daily. 30 tablet 6  . CYCLOBENZAPRINE HCL 10 MG PO TABS Oral Take 0.5 tablets (5 mg total) by mouth 3 (three) times daily as needed for muscle spasms. 90 tablet 3  . FOLIC ACID 1 MG PO TABS Oral Take 1 mg by mouth daily.      Marland Kitchen GABAPENTIN 300 MG PO CAPS Oral Take 1 capsule (300 mg total) by mouth at bedtime. 30 capsule 3  . LATANOPROST 0.005 % OP SOLN Both Eyes Place 1 drop into both eyes at bedtime.      Marland Kitchen MECLIZINE HCL 12.5 MG PO TABS Oral Take 1 tablet (12.5 mg total) by mouth every 8 (eight) hours as needed. 30 tablet 6  . METFORMIN HCL 500 MG PO TABS Oral Take 1 tablet (500 mg total) by mouth 2 (two) times daily with a meal. 60 tablet 11  .  METOPROLOL SUCCINATE 50 MG PO TB24 Oral Take 1 tablet (50 mg total) by mouth daily. 30 tablet 6  . NYSTATIN 100000 UNIT/GM EX POWD  Use as directed to affected area twice per day as needed 15 g 2  . POTASSIUM CHLORIDE 10 MEQ PO TBCR Oral Take 1 tablet (10 mEq total) by mouth daily. 60 tablet 3  . SIMVASTATIN 40 MG PO TABS Oral Take 1 tablet (40 mg total) by mouth at bedtime. 90 tablet 3  . TORSEMIDE 20 MG PO TABS Oral Take 1 tablet (20 mg total) by mouth 2 (two) times daily. 60 tablet 3  . WARFARIN SODIUM 4 MG PO TABS Oral Take 4 mg by mouth daily.      Marland Kitchen ZOLPIDEM TARTRATE 10 MG PO TABS  TAKE ONE TABLET BY MOUTH AT BEDTIME AS NEEDED FOR SLEEP 30 tablet 5    BP 108/52  Pulse 80  Temp(Src) 98.5 F (36.9 C) (Oral)  Resp 18  SpO2 97%  Physical Exam  Constitutional: She is oriented to person, place, and time. She appears well-developed and well-nourished.  HENT:  Head: Normocephalic and atraumatic.  Right Ear: Tympanic membrane normal.  Left Ear: Tympanic membrane normal.  Mouth/Throat: Uvula is midline, oropharynx is clear and moist and mucous membranes are normal.  Eyes: EOM are normal. Pupils are equal, round, and reactive to light.  Neck: Normal range of motion.  Cardiovascular: Normal rate.  An irregular rhythm present.  No murmur heard. Pulmonary/Chest: Effort normal and breath sounds normal.  Neurological: She is alert and oriented to person, place, and time. She has normal strength. Gait normal. GCS eye subscore is 4. GCS verbal subscore is 5. GCS motor subscore is 6.       + Romberg  Skin: Skin is warm and dry.    ED Course  Procedures (including critical care time)  Labs Reviewed  POCT URINALYSIS DIP (DEVICE) - Abnormal; Notable for the following:    Bilirubin Urine SMALL (*)    Protein, ur 30 (*)    Leukocytes, UA SMALL (*) Biochemical Testing Only. Please order routine urinalysis from main lab if confirmatory testing is needed.   All other components within normal  limits  PROTIME-INR - Abnormal; Notable for the following:    Prothrombin Time 26.2 (*)    INR 2.36 (*)    All other components within normal limits  CBC  POCT I-STAT, CHEM 8   No results found.   No diagnosis found.    MDM  ECG: normal sinus rhythm, rate 69, no acute findings; labs reviewed: chemistry within normal limits; INR 2.36; no signs of  anemia       Richardo Priest, MD 04/17/11 2007

## 2011-04-17 NOTE — ED Notes (Signed)
Dizziness onset 2 days ago, intermittent.  Yesterday dizziness set in as constant.  Spoke to dr norins office and told to come to [facility].  Reports nosebleeds over the past few days, more nose bleeds and harder to stop.  Denies chest pain, no headache.  No new swelling.  Denies urinary symptoms, last bm was yesterday and "normal " for patient reported as diarrhea and patient feels this is related to glucaphage.  Onset 6 months ago.  Denies falls, denies medicine changes

## 2011-04-19 ENCOUNTER — Telehealth: Payer: Self-pay

## 2011-04-19 MED ORDER — LANCETS MISC: Status: DC

## 2011-04-19 MED ORDER — GLUCOSE BLOOD VI STRP: ORAL_STRIP | Status: DC

## 2011-04-19 NOTE — Telephone Encounter (Signed)
[facility] Triage Call Report Triage Record Num: [id number] Operator: Baldomero Lamy Patient Name: Abigail Oliver Call Date & Time: 04/17/2011 10:50:41AM Patient Phone: [phone] PCP: Illene Regulus Patient Gender: Female PCP Fax : [phone] Patient DOB: 01/22/1946 [facility] Name[facility] Reason for Call: Caller: [name]/Patient; PCP: Illene Regulus; CB#: [phone]; Call Reason: Dizzy, Ongoing for 3. Days. Calling From Work; Sx Onset: 04/14/2011; Sx Notes: ; Afebrile; Wt: ; Home treatment(s) tried: Drank soda; Did home treatment help?: No; Guideline Used: ; Disp:; Appt Scheduled?: Pt calling regarding dizziness and unsteady on feet. Pt at [employer]. Pt is diabetic but does not have a test kit to check bs. Pt states it is just an all over malaise-must hold wall when standing up. Pt has had several nosebleeds in the last week-not now. Pt in for Coumadin check last week and it was "fine". Urgent sxs of Dizziness or Vertigo: Having sensations of turning or spinning that affects balance AND not responsive to4 hrs of home care. Advised pt to see [facility]. Pt to call husband not to come get her and drive her there. Triager advised pt that that was a good plan-advised not to drive self. Protocol(s) Used: Dizziness or Vertigo Recommended Outcome per Protocol: See Provider within 4 hours Reason for Outcome: Having sensations of turning or spinning that affects balance AND not responsive to 4 hours of home care Care Advice: Call EMS 911 if patient develops confusion, decreased level of consciousness, chest pain, shortness of breath, or focal neurologic abnormalities such as facial droop or weakness of one extremity. ~ ~ DO NOT drive until condition evaluated. ~ Call provider if symptoms worsen or new symptoms develop. Avoid caffeine (coffee, tea, cola drinks, or chocolate), alcohol, and nicotine (use of tobacco), as use of these substances may worsen  symptoms. ~ ~ SYMPTOM / CONDITION MANAGEMENT ~ CAUTIONS ~ List, or take, all current prescription(s), nonprescription or alternative medication(s) to provider for evaluation. Consider taking nonprescription motion sickness medication (Antivert, Dramamine) according to package or pharmacist's directions. ~ ~ Lorenz Coaster still in a dimly lit room and avoid any sudden change in position. 04/17/2011 11:02:15AM Page 1 of 1 CAN_[email or web address]_V2

## 2011-04-20 ENCOUNTER — Telehealth: Payer: Self-pay | Admitting: Internal Medicine

## 2011-04-20 NOTE — Telephone Encounter (Signed)
Call patient: INR= 2.36 good value. Continue present dose. Repeat INR in 1 month

## 2011-04-21 NOTE — Telephone Encounter (Signed)
Informed pt .

## 2011-04-23 ENCOUNTER — Ambulatory Visit: Payer: Medicare Other | Admitting: Internal Medicine

## 2011-04-23 ENCOUNTER — Ambulatory Visit (INDEPENDENT_AMBULATORY_CARE_PROVIDER_SITE_OTHER): Payer: Medicare Other | Admitting: Internal Medicine

## 2011-04-23 ENCOUNTER — Ambulatory Visit (INDEPENDENT_AMBULATORY_CARE_PROVIDER_SITE_OTHER)
Admission: RE | Admit: 2011-04-23 | Discharge: 2011-04-23 | Disposition: A | Discharge status: Home / Self Care | Payer: Medicare Other | Source: Ambulatory Visit | Attending: Internal Medicine | Admitting: Internal Medicine

## 2011-04-23 ENCOUNTER — Encounter: Payer: Self-pay | Admitting: Internal Medicine

## 2011-04-23 ENCOUNTER — Telehealth: Payer: Self-pay

## 2011-04-23 DIAGNOSIS — R059 Cough, unspecified: Secondary | ICD-10-CM

## 2011-04-23 DIAGNOSIS — R05 Cough: Secondary | ICD-10-CM

## 2011-04-23 DIAGNOSIS — J209 Acute bronchitis, unspecified: Secondary | ICD-10-CM

## 2011-04-23 DIAGNOSIS — J111 Influenza due to unidentified influenza virus with other respiratory manifestations: Secondary | ICD-10-CM | POA: Insufficient documentation

## 2011-04-23 MED ORDER — GUAIFENESIN-CODEINE 100-10 MG/5ML PO SYRP: 10.0000 mL | ORAL_SOLUTION | Freq: Three times a day (TID) | ORAL | Status: DC | PRN

## 2011-04-23 MED ORDER — CEFUROXIME AXETIL 500 MG PO TABS: 500.0000 mg | ORAL_TABLET | Freq: Two times a day (BID) | ORAL | Status: AC

## 2011-04-23 NOTE — Assessment & Plan Note (Signed)
She has presented too late to benefit from anti-viral meds

## 2011-04-23 NOTE — Assessment & Plan Note (Signed)
Start antibiotics and a cough suppressant 

## 2011-04-23 NOTE — Progress Notes (Signed)
  Subjective:    Patient ID: Abigail Oliver, female    DOB: 08/22/1945, 65 y.o.   MRN: [id number]  Cough This is a new problem. Episode onset: 3 days ago. The problem has been gradually worsening. The problem occurs hourly. The cough is productive of purulent sputum. Associated symptoms include chills, a fever, headaches, myalgias, a sore throat, shortness of breath and sweats. Pertinent negatives include no chest pain, ear congestion, ear pain, heartburn, hemoptysis, nasal congestion, postnasal drip, rash, rhinorrhea, weight loss or wheezing. The symptoms are aggravated by nothing. She has tried nothing for the symptoms.      Review of Systems  Constitutional: Positive for fever, chills and fatigue. Negative for weight loss, diaphoresis, appetite change and unexpected weight change.  HENT: Positive for sore throat. Negative for ear pain, facial swelling, rhinorrhea, trouble swallowing, neck pain, neck stiffness, voice change and postnasal drip.   Eyes: Negative.   Respiratory: Positive for cough and shortness of breath. Negative for apnea, hemoptysis, choking, chest tightness, wheezing and stridor.   Cardiovascular: Negative for chest pain, palpitations and leg swelling.  Gastrointestinal: Negative for heartburn, nausea, vomiting, abdominal pain, diarrhea, constipation and abdominal distention.  Genitourinary: Negative for dysuria, urgency, frequency, hematuria, flank pain, decreased urine volume, enuresis, difficulty urinating and dyspareunia.  Musculoskeletal: Positive for myalgias. Negative for back pain, joint swelling, arthralgias and gait problem.  Skin: Negative for color change, pallor, rash and wound.  Neurological: Positive for headaches. Negative for dizziness, tremors, seizures, syncope, facial asymmetry, speech difficulty, weakness, light-headedness and numbness.  Hematological: Negative for adenopathy. Does not bruise/bleed easily.  Psychiatric/Behavioral: Negative.          Objective:   Physical Exam  Vitals reviewed. Constitutional: She is oriented to person, place, and time. She appears well-developed and well-nourished. No distress.  HENT:  Head: Normocephalic and atraumatic.  Mouth/Throat: Oropharynx is clear and moist. No oropharyngeal exudate.  Eyes: Conjunctivae are normal. Right eye exhibits no discharge. Left eye exhibits no discharge. No scleral icterus.  Neck: Normal range of motion. Neck supple. No JVD present. No tracheal deviation present. No thyromegaly present.  Cardiovascular: Normal rate, regular rhythm and intact distal pulses.  Exam reveals no gallop and no friction rub.   Murmur heard.  Systolic murmur is present       + click  Pulmonary/Chest: Effort normal. No accessory muscle usage or stridor. Not tachypneic. No respiratory distress. She has no decreased breath sounds. She has no wheezes. She has rhonchi in the right middle field and the left middle field. She has no rales. She exhibits no tenderness.  Abdominal: Soft. Bowel sounds are normal. She exhibits no distension. There is no tenderness. There is no rebound and no guarding.  Musculoskeletal: Normal range of motion. She exhibits no edema and no tenderness.  Lymphadenopathy:    She has no cervical adenopathy.  Neurological: She is oriented to person, place, and time.  Skin: Skin is warm and dry. No rash noted. She is not diaphoretic. No erythema. No pallor.  Psychiatric: She has a normal mood and affect. Her behavior is normal. Judgment and thought content normal.          Assessment & Plan:

## 2011-04-23 NOTE — Assessment & Plan Note (Signed)
Will check a CXR to look for pna, edema, etc.

## 2011-04-23 NOTE — Telephone Encounter (Signed)
[facility] Triage Call Report Triage Record Num: [id number] Operator: Tarri Glenn Patient Name: Abigail Oliver Call Date & Time: 04/22/2011 9:28:05PM Patient Phone: [phone] PCP: Illene Regulus Patient Gender: Female PCP Fax : [phone] Patient DOB: 30-Aug-1945 [facility] Name[facility] Reason for Call: Caller: Kathy/Patient; PCP: Illene Regulus; CB#: [phone]; Call Reason: Fever and Cough; Sx Onset: 04/22/2011; Sx Notes: ; Temp:100.1 Oral at 20:39; Wt: ; Guideline Used: ; Disp:; Appt Scheduled?: Patient is calling about temp. 101.4 (O) @ 2039. Patient has sore throat, cold and shakey. Onset 04/21/11. All emergent s/s r/o with exception to creamy-white sore patches in the mouth or throat and patches brush off or come off when eating, leaving a new, bleeding surface per Sore Throat or Hoarseness protocol. Advised patient to call office 04/23/11 for appt. Protocol(s) Used: Sore Throat or Hoarseness Recommended Outcome per Protocol: See Provider within 24 hours Reason for Outcome: Creamy-white sore patches in the mouth or throat AND patches brush off or come off when eating, leaving a new, bleeding surface Care Advice: Increase intake of fluids. Drinking through a straw may reduce discomfort. Eat foods that are easy to swallow. Avoid drinking carbonated or alcoholic drinks. ~ Sore Throat Relief: - Use warm salt water gargles 3 to 4 times/day, as needed (1/2 tsp. salt in 8 oz. [.2 liters] water). - Suck on hard candy, nonprescription or herbal throat lozenges (sugar-free if diabetic) - Eat soothing, soft food/fluids (broths, soups, or honey and lemon juice in hot tea, Popsicles, frozen yogurt or sherbet, scrambled eggs, cooked cereals, Jell-O or puddings) whichever is most comforting. - Avoid eating salty, spicy or acidic foods. ~ Rinse mouth with a salt solution (1/2 tsp. salt in 8 ounces [.2 liter] of water) or an antiseptic mouthwash without alcohol 3 or more  times a day after eating to relieve symptoms. The solution should not be swallowed. ~ 04/22/2011 9:39:01PM Page 1 of 1 CAN_[email or web address]_V2

## 2011-04-23 NOTE — Patient Instructions (Signed)

## 2011-04-26 ENCOUNTER — Ambulatory Visit: Payer: Medicare Other | Admitting: Internal Medicine

## 2011-04-26 ENCOUNTER — Ambulatory Visit (INDEPENDENT_AMBULATORY_CARE_PROVIDER_SITE_OTHER)
Admission: RE | Admit: 2011-04-26 | Discharge: 2011-04-26 | Disposition: A | Discharge status: Home / Self Care | Payer: Medicare Other | Source: Ambulatory Visit | Attending: Internal Medicine | Admitting: Internal Medicine

## 2011-04-26 ENCOUNTER — Other Ambulatory Visit (INDEPENDENT_AMBULATORY_CARE_PROVIDER_SITE_OTHER): Payer: Medicare Other

## 2011-04-26 ENCOUNTER — Ambulatory Visit (INDEPENDENT_AMBULATORY_CARE_PROVIDER_SITE_OTHER): Payer: Medicare Other | Admitting: Internal Medicine

## 2011-04-26 ENCOUNTER — Encounter: Payer: Self-pay | Admitting: Internal Medicine

## 2011-04-26 DIAGNOSIS — H8309 Labyrinthitis, unspecified ear: Secondary | ICD-10-CM

## 2011-04-26 DIAGNOSIS — E119 Type 2 diabetes mellitus without complications: Secondary | ICD-10-CM

## 2011-04-26 DIAGNOSIS — J209 Acute bronchitis, unspecified: Secondary | ICD-10-CM

## 2011-04-26 DIAGNOSIS — J111 Influenza due to unidentified influenza virus with other respiratory manifestations: Secondary | ICD-10-CM

## 2011-04-26 DIAGNOSIS — S4991XA Unspecified injury of right shoulder and upper arm, initial encounter: Secondary | ICD-10-CM

## 2011-04-26 DIAGNOSIS — Z5181 Encounter for therapeutic drug level monitoring: Secondary | ICD-10-CM

## 2011-04-26 DIAGNOSIS — S4980XA Other specified injuries of shoulder and upper arm, unspecified arm, initial encounter: Secondary | ICD-10-CM

## 2011-04-26 DIAGNOSIS — S46909A Unspecified injury of unspecified muscle, fascia and tendon at shoulder and upper arm level, unspecified arm, initial encounter: Secondary | ICD-10-CM

## 2011-04-26 DIAGNOSIS — E785 Hyperlipidemia, unspecified: Secondary | ICD-10-CM

## 2011-04-26 DIAGNOSIS — Z7901 Long term (current) use of anticoagulants: Secondary | ICD-10-CM

## 2011-04-26 DIAGNOSIS — I359 Nonrheumatic aortic valve disorder, unspecified: Secondary | ICD-10-CM

## 2011-04-26 LAB — HEMOGLOBIN A1C: Hgb A1c MFr Bld: 6.3 % (ref 4.6–6.5)

## 2011-04-26 LAB — URINALYSIS, ROUTINE W REFLEX MICROSCOPIC
Bilirubin Urine: NEGATIVE
Hgb urine dipstick: NEGATIVE
Leukocytes, UA: NEGATIVE
Nitrite: NEGATIVE
Specific Gravity, Urine: 1.025 (ref 1.000–1.030)
Total Protein, Urine: 30
Urine Glucose: NEGATIVE
Urobilinogen, UA: 0.2 (ref 0.0–1.0)
pH: 6 (ref 5.0–8.0)

## 2011-04-26 LAB — CBC WITH DIFFERENTIAL/PLATELET
Basophils Absolute: 0 10*3/uL (ref 0.0–0.1)
Basophils Relative: 0.4 % (ref 0.0–3.0)
Eosinophils Absolute: 0.1 10*3/uL (ref 0.0–0.7)
Eosinophils Relative: 1.4 % (ref 0.0–5.0)
HCT: 39.9 % (ref 36.0–46.0)
Hemoglobin: 13.7 g/dL (ref 12.0–15.0)
Lymphocytes Relative: 51.1 % — ABNORMAL HIGH (ref 12.0–46.0)
Lymphs Abs: 3 10*3/uL (ref 0.7–4.0)
MCHC: 34.4 g/dL (ref 30.0–36.0)
MCV: 87.1 fl (ref 78.0–100.0)
Monocytes Absolute: 0.6 10*3/uL (ref 0.1–1.0)
Monocytes Relative: 10.1 % (ref 3.0–12.0)
Neutro Abs: 2.2 10*3/uL (ref 1.4–7.7)
Neutrophils Relative %: 37 % — ABNORMAL LOW (ref 43.0–77.0)
Platelets: 159 10*3/uL (ref 150.0–400.0)
RBC: 4.58 Mil/uL (ref 3.87–5.11)
RDW: 15.9 % — ABNORMAL HIGH (ref 11.5–14.6)
WBC: 6 10*3/uL (ref 4.5–10.5)

## 2011-04-26 LAB — PROTIME-INR
INR: 2.1 ratio — ABNORMAL HIGH (ref 0.8–1.0)
Prothrombin Time: 23.6 s — ABNORMAL HIGH (ref 10.2–12.4)

## 2011-04-26 NOTE — Patient Instructions (Signed)

## 2011-04-26 NOTE — Assessment & Plan Note (Signed)
persistent

## 2011-04-26 NOTE — Assessment & Plan Note (Signed)
I will check her INR today

## 2011-04-26 NOTE — Progress Notes (Signed)
Subjective:    Patient ID: Abigail Oliver, female    DOB: [birth year], 65 y.o.   MRN: [id number]  HPI She returns for f/up and she tells me that her fever finally broke last night and her cough is getting better. She has chronic dizziness for more than one year that has been exacerbated by this URI. She got up during the night last night and hit her right humerus and has pain and bruising in that area.   Review of Systems  Constitutional: Negative for fever, chills, diaphoresis, activity change, appetite change, fatigue and unexpected weight change.  HENT: Negative for sore throat, facial swelling, trouble swallowing, neck pain, neck stiffness and voice change.   Eyes: Negative.   Respiratory: Negative for apnea, cough, choking, chest tightness, shortness of breath, wheezing and stridor.   Cardiovascular: Negative for chest pain, palpitations and leg swelling.  Gastrointestinal: Negative for nausea, vomiting, abdominal pain, diarrhea, constipation, blood in stool and abdominal distention.  Genitourinary: Negative for dysuria, urgency, frequency, hematuria, flank pain, decreased urine volume, enuresis, difficulty urinating and dyspareunia.  Musculoskeletal: Positive for arthralgias (right upper arm). Negative for myalgias, back pain, joint swelling and gait problem.  Skin: Positive for color change. Negative for pallor, rash and wound.  Neurological: Positive for dizziness (chronic) and light-headedness. Negative for tremors, seizures, syncope, facial asymmetry, speech difficulty, weakness, numbness and headaches.  Hematological: Negative for adenopathy. Does not bruise/bleed easily.  Psychiatric/Behavioral: Negative.        Objective:   Physical Exam  Vitals reviewed. Constitutional: She is oriented to person, place, and time. She appears well-developed and well-nourished. No distress.  HENT:  Head: Normocephalic and atraumatic.  Nose: Nose normal.  Mouth/Throat: Oropharynx is clear  and moist. No oropharyngeal exudate.  Eyes: Conjunctivae are normal. Right eye exhibits no discharge. Left eye exhibits no discharge. No scleral icterus.  Neck: Normal range of motion. Neck supple. No JVD present. No tracheal deviation present. No thyromegaly present.  Cardiovascular: Normal rate, regular rhythm and intact distal pulses.  Exam reveals no gallop and no friction rub.   Murmur heard.      + click  Pulmonary/Chest: Effort normal and breath sounds normal. No stridor. No respiratory distress. She has no wheezes. She has no rales. She exhibits no tenderness.  Abdominal: Soft. Bowel sounds are normal. She exhibits no distension and no mass. There is no tenderness. There is no rebound and no guarding.  Musculoskeletal: Normal range of motion. She exhibits no edema and no tenderness.       Right upper arm: She exhibits tenderness. She exhibits no bony tenderness, no swelling, no edema, no deformity and no laceration.       Arms: Lymphadenopathy:    She has no cervical adenopathy.  Neurological: She is oriented to person, place, and time.  Skin: Skin is warm and dry. No rash noted. She is not diaphoretic. No erythema. No pallor.  Psychiatric: She has a normal mood and affect. Her behavior is normal. Judgment and thought content normal.          Lab Results  Component Value Date   WBC 7.4 04/17/2011   HGB 13.9 04/17/2011   HCT 41.0 04/17/2011   PLT 174 04/17/2011   GLUCOSE 97 04/17/2011   CHOL 128 12/30/2008   TRIG 118.0 12/30/2008   HDL 42.10 12/30/2008   LDLCALC 62 12/30/2008   ALT 41* 12/30/2008   AST 50* 12/30/2008   NA 138 04/17/2011   K 4.2 04/17/2011   CL  102 04/17/2011   CREATININE 0.90 04/17/2011   BUN 18 04/17/2011   CO2 31 12/30/2008   INR 2.36* 04/17/2011   HGBA1C 7.9* 09/01/2010   Assessment & Plan:

## 2011-04-26 NOTE — Assessment & Plan Note (Signed)
This has improved.

## 2011-04-26 NOTE — Assessment & Plan Note (Signed)
I will check a plain film of the right humerus

## 2011-04-26 NOTE — Assessment & Plan Note (Signed)
This is resolving

## 2011-04-26 NOTE — Assessment & Plan Note (Signed)
I will check her a1c and will monitor her renal function 

## 2011-04-26 NOTE — Assessment & Plan Note (Signed)
I will check her FLP and CMP today

## 2011-04-27 ENCOUNTER — Encounter: Payer: Self-pay | Admitting: Internal Medicine

## 2011-04-27 LAB — COMPREHENSIVE METABOLIC PANEL
ALT: 56 U/L — ABNORMAL HIGH (ref 0–35)
AST: 78 U/L — ABNORMAL HIGH (ref 0–37)
Albumin: 4.4 g/dL (ref 3.5–5.2)
Alkaline Phosphatase: 57 U/L (ref 39–117)
BUN: 18 mg/dL (ref 6–23)
CO2: 30 mEq/L (ref 19–32)
Calcium: 10.4 mg/dL (ref 8.4–10.5)
Chloride: 100 mEq/L (ref 96–112)
Creatinine, Ser: 1 mg/dL (ref 0.4–1.2)
GFR: 56.41 mL/min — ABNORMAL LOW (ref >60.00)
Glucose, Bld: 127 mg/dL — ABNORMAL HIGH (ref 70–99)
Potassium: 4.8 mEq/L (ref 3.5–5.1)
Sodium: 142 mEq/L (ref 135–145)
Total Bilirubin: 0.8 mg/dL (ref 0.3–1.2)
Total Protein: 8.4 g/dL — ABNORMAL HIGH (ref 6.0–8.3)

## 2011-04-27 LAB — LIPID PANEL
Cholesterol: 192 mg/dL (ref 0–200)
HDL: 31.3 mg/dL — ABNORMAL LOW (ref >39.00)
LDL Cholesterol: 124 mg/dL — ABNORMAL HIGH (ref 0–99)
Total CHOL/HDL Ratio: 6
Triglycerides: 186 mg/dL — ABNORMAL HIGH (ref 0.0–149.0)
VLDL: 37.2 mg/dL (ref 0.0–40.0)

## 2011-04-27 LAB — TSH: TSH: 4.47 u[IU]/mL (ref 0.35–5.50)

## 2011-04-30 ENCOUNTER — Other Ambulatory Visit: Payer: Self-pay | Admitting: Internal Medicine

## 2011-05-04 ENCOUNTER — Other Ambulatory Visit: Payer: Self-pay | Admitting: Internal Medicine

## 2011-05-24 ENCOUNTER — Other Ambulatory Visit: Payer: Self-pay | Admitting: *Deleted

## 2011-05-24 MED ORDER — METOPROLOL SUCCINATE ER 50 MG PO TB24: 50.0000 mg | ORAL_TABLET | Freq: Every day | ORAL | Status: DC

## 2011-05-29 ENCOUNTER — Other Ambulatory Visit: Payer: Self-pay | Admitting: *Deleted

## 2011-05-29 MED ORDER — METOPROLOL SUCCINATE ER 50 MG PO TB24: 50.0000 mg | ORAL_TABLET | Freq: Every day | ORAL | Status: DC

## 2011-05-31 ENCOUNTER — Other Ambulatory Visit: Payer: Self-pay | Admitting: *Deleted

## 2011-05-31 MED ORDER — POTASSIUM CHLORIDE CRYS ER 10 MEQ PO TBCR: 10.0000 meq | EXTENDED_RELEASE_TABLET | Freq: Two times a day (BID) | ORAL | Status: DC

## 2011-06-02 DIAGNOSIS — Z7901 Long term (current) use of anticoagulants: Secondary | ICD-10-CM | POA: Diagnosis not present

## 2011-06-07 DIAGNOSIS — H409 Unspecified glaucoma: Secondary | ICD-10-CM | POA: Diagnosis not present

## 2011-06-07 DIAGNOSIS — H4011X0 Primary open-angle glaucoma, stage unspecified: Secondary | ICD-10-CM | POA: Diagnosis not present

## 2011-06-24 ENCOUNTER — Telehealth: Payer: Self-pay | Admitting: Internal Medicine

## 2011-06-24 NOTE — Telephone Encounter (Signed)
COULD PT BE WORKED IN EARLY AFTERNOON (BETWEEN 12 AND 2).  SHE HAS A VISIBLE KNOT IN HER THROAT UNDER HER JAW.

## 2011-06-24 NOTE — Telephone Encounter (Signed)
Can work in this pm but I don't know if that can be done before 2 PM

## 2011-06-25 HISTORY — PX: LYMPH NODE BIOPSY: SHX201

## 2011-06-28 ENCOUNTER — Ambulatory Visit (INDEPENDENT_AMBULATORY_CARE_PROVIDER_SITE_OTHER): Payer: Medicare Other | Admitting: Internal Medicine

## 2011-06-28 ENCOUNTER — Encounter: Payer: Self-pay | Admitting: Internal Medicine

## 2011-06-28 VITALS — BP 106/52 | HR 79 | Temp 98.1°F | Wt 195.8 lb

## 2011-06-28 DIAGNOSIS — M719 Bursopathy, unspecified: Secondary | ICD-10-CM

## 2011-06-28 DIAGNOSIS — R220 Localized swelling, mass and lump, head: Secondary | ICD-10-CM

## 2011-06-28 DIAGNOSIS — Z7901 Long term (current) use of anticoagulants: Secondary | ICD-10-CM | POA: Diagnosis not present

## 2011-06-28 DIAGNOSIS — M778 Other enthesopathies, not elsewhere classified: Secondary | ICD-10-CM

## 2011-06-28 DIAGNOSIS — R221 Localized swelling, mass and lump, neck: Secondary | ICD-10-CM

## 2011-06-28 DIAGNOSIS — M67919 Unspecified disorder of synovium and tendon, unspecified shoulder: Secondary | ICD-10-CM

## 2011-06-28 MED ORDER — MELOXICAM 15 MG PO TABS: 15.0000 mg | ORAL_TABLET | Freq: Every day | ORAL | Status: DC

## 2011-06-28 NOTE — Progress Notes (Signed)
  Subjective:    Patient ID: Abigail Oliver, female    DOB: 1945-12-26, 66 y.o.   MRN: [id number]  HPI  complains of growth R side of neck Noted ?6 weeks ago - unchanged in  Since onse Precipitated by URI ? - but now feeling well No fever, +night sweats No dysphagia  Also R shoulder pain Onset 6-8 weeks ago Precipitated by accidental fall Pain with overhead movement - radiates to elbow  Past Medical History  Diagnosis Date  . Coronary atherosclerosis of native coronary artery   . Diabetes mellitus   . Hyperlipidemia   . Chronic angle-closure glaucoma   . Aortic valve disorders       Review of Systems  Constitutional: Negative for appetite change and fatigue.  HENT: Negative for ear pain, facial swelling, neck pain and ear discharge.   Respiratory: Negative for cough and shortness of breath.        Objective:   Physical Exam BP 106/52  Pulse 79  Temp(Src) 98.1 F (36.7 C) (Oral)  Wt 195 lb 12.8 oz (88.814 kg)  SpO2 96% Wt Readings from Last 3 Encounters:  06/28/11 195 lb 12.8 oz (88.814 kg)  04/26/11 193 lb (87.544 kg)  04/23/11 200 lb (90.719 kg)   Constitutional: She appears well-developed and well-nourished. No distress.  Neck: 2 cm x 1.8 cm prominent mass on R neck, nontender - suspect LAD - Normal range of motion. Neck supple. No JVD present. No thyromegaly present.  Cardiovascular: Normal rate, regular rhythm and normal heart sounds.  No murmur heard. No BLE edema. Pulmonary/Chest: Effort normal and breath sounds normal. No respiratory distress. She has no wheezes.  Musculoskeletal: R Shoulder: No obvious deformities. Decreased range of motion. Good strength with stressing of rotator cuff but with pain. Limited internal rotation and abduction. Positive impingement signs. Pain elicited with overhead activities. Neuro vascularly intact distally. Skin: Skin is warm and dry. No rash noted. No erythema.  Psychiatric: She has a normal mood and affect. Her behavior  is normal. Judgment and thought content normal.   Lab Results  Component Value Date   WBC 6.0 04/26/2011   HGB 13.7 04/26/2011   HCT 39.9 04/26/2011   PLT 159.0 04/26/2011   GLUCOSE 127* 04/26/2011   CHOL 192 04/26/2011   TRIG 186.0* 04/26/2011   HDL 31.30* 04/26/2011   LDLCALC 124* 04/26/2011   ALT 56* 04/26/2011   AST 78* 04/26/2011   NA 142 04/26/2011   K 4.8 04/26/2011   CL 100 04/26/2011   CREATININE 1.0 04/26/2011   BUN 18 04/26/2011   CO2 30 04/26/2011   TSH 4.47 04/26/2011   INR 2.1* 04/26/2011   HGBA1C 6.3 04/26/2011       Assessment & Plan:  R side neck - LAD suspected, no longer ill (URI/flu early 04/2011) - check soft tissue US and consider LN bx if needed  R shoulder pain - accidental 4-6 weeks ago - suspect tendonitis based on exam - start NSAIDs - follow up PCP as needed

## 2011-06-28 NOTE — Patient Instructions (Signed)
It was good to see you today. we'll make referral for neck ultrasound to look at lump . Our office will contact you regarding appointment(s) once made. You will then be called with results once available Start meloxicam once daily for shoulder pain - Your prescription(s) have been submitted to your pharmacy. Please take as directed and contact our office if you believe you are having problem(s) with the medication(s). Please schedule followup in 2-3 weeks with Dr Debby Bud, call sooner if problems.

## 2011-06-29 ENCOUNTER — Ambulatory Visit
Admission: RE | Admit: 2011-06-29 | Discharge: 2011-06-29 | Disposition: A | Discharge status: Home / Self Care | Payer: Medicare Other | Source: Ambulatory Visit | Attending: Internal Medicine | Admitting: Internal Medicine

## 2011-06-29 DIAGNOSIS — R220 Localized swelling, mass and lump, head: Secondary | ICD-10-CM

## 2011-06-29 DIAGNOSIS — E041 Nontoxic single thyroid nodule: Secondary | ICD-10-CM | POA: Diagnosis not present

## 2011-06-30 ENCOUNTER — Other Ambulatory Visit (INDEPENDENT_AMBULATORY_CARE_PROVIDER_SITE_OTHER): Payer: Medicare Other

## 2011-06-30 ENCOUNTER — Ambulatory Visit (INDEPENDENT_AMBULATORY_CARE_PROVIDER_SITE_OTHER): Payer: Medicare Other | Admitting: Internal Medicine

## 2011-06-30 ENCOUNTER — Encounter: Payer: Self-pay | Admitting: Internal Medicine

## 2011-06-30 DIAGNOSIS — E119 Type 2 diabetes mellitus without complications: Secondary | ICD-10-CM

## 2011-06-30 DIAGNOSIS — M79609 Pain in unspecified limb: Secondary | ICD-10-CM

## 2011-06-30 DIAGNOSIS — R221 Localized swelling, mass and lump, neck: Secondary | ICD-10-CM | POA: Diagnosis not present

## 2011-06-30 DIAGNOSIS — Z7901 Long term (current) use of anticoagulants: Secondary | ICD-10-CM | POA: Diagnosis not present

## 2011-06-30 DIAGNOSIS — R599 Enlarged lymph nodes, unspecified: Secondary | ICD-10-CM

## 2011-06-30 DIAGNOSIS — R220 Localized swelling, mass and lump, head: Secondary | ICD-10-CM

## 2011-06-30 DIAGNOSIS — I2510 Atherosclerotic heart disease of native coronary artery without angina pectoris: Secondary | ICD-10-CM | POA: Diagnosis not present

## 2011-06-30 DIAGNOSIS — I359 Nonrheumatic aortic valve disorder, unspecified: Secondary | ICD-10-CM | POA: Diagnosis not present

## 2011-06-30 DIAGNOSIS — M79601 Pain in right arm: Secondary | ICD-10-CM

## 2011-06-30 LAB — COMPREHENSIVE METABOLIC PANEL
ALT: 43 U/L — ABNORMAL HIGH (ref 0–35)
AST: 56 U/L — ABNORMAL HIGH (ref 0–37)
Albumin: 4.2 g/dL (ref 3.5–5.2)
Alkaline Phosphatase: 53 U/L (ref 39–117)
BUN: 14 mg/dL (ref 6–23)
CO2: 29 mEq/L (ref 19–32)
Calcium: 9.5 mg/dL (ref 8.4–10.5)
Chloride: 102 mEq/L (ref 96–112)
Creatinine, Ser: 0.8 mg/dL (ref 0.4–1.2)
GFR: 79.76 mL/min (ref >60.00)
Glucose, Bld: 183 mg/dL — ABNORMAL HIGH (ref 70–99)
Potassium: 3.8 mEq/L (ref 3.5–5.1)
Sodium: 139 mEq/L (ref 135–145)
Total Bilirubin: 0.9 mg/dL (ref 0.3–1.2)
Total Protein: 7.8 g/dL (ref 6.0–8.3)

## 2011-07-01 DIAGNOSIS — R221 Localized swelling, mass and lump, neck: Secondary | ICD-10-CM | POA: Insufficient documentation

## 2011-07-01 NOTE — Assessment & Plan Note (Signed)
Patient with nodule and lymphadenopathy.  Plan - CT neck ordered           May need ENT consult

## 2011-07-01 NOTE — Progress Notes (Signed)
  Subjective:    Patient ID: Abigail Oliver, female    DOB: 1945/07/20, 66 y.o.   MRN: [id number]  HPI [name] recently saw Dr. Felicity Oliver. She was noted to have a nodule in the right neck at the angle of the jaw. She had u/s neck which revealed a nodule as well as several enlarge lymph nodes. She has not had weight loss or night sweats, no dysphagia.  I have reviewed the patient's medical history in detail and updated the computerized patient record.   Review of Systems System review is negative for any constitutional, cardiac, pulmonary, GI or neuro symptoms or complaints other than as described in the HPI.     Objective:   Physical Exam Filed Vitals:   06/30/11 0944  BP: 122/66  Pulse: 89  Temp: 97.7 F (36.5 C)  Resp: 14   Neck - visible nodule right neck Cor- RRR Pulm - normal respirations Neuro - A&O x 3       Assessment & Plan:

## 2011-07-02 ENCOUNTER — Ambulatory Visit (INDEPENDENT_AMBULATORY_CARE_PROVIDER_SITE_OTHER)
Admission: RE | Admit: 2011-07-02 | Discharge: 2011-07-02 | Disposition: A | Discharge status: Home / Self Care | Payer: Medicare Other | Source: Ambulatory Visit | Attending: Internal Medicine | Admitting: Internal Medicine

## 2011-07-02 ENCOUNTER — Telehealth: Payer: Self-pay | Admitting: *Deleted

## 2011-07-02 DIAGNOSIS — M79609 Pain in unspecified limb: Secondary | ICD-10-CM

## 2011-07-02 DIAGNOSIS — R599 Enlarged lymph nodes, unspecified: Secondary | ICD-10-CM

## 2011-07-02 DIAGNOSIS — M5030 Other cervical disc degeneration, unspecified cervical region: Secondary | ICD-10-CM | POA: Diagnosis not present

## 2011-07-02 DIAGNOSIS — M79601 Pain in right arm: Secondary | ICD-10-CM

## 2011-07-02 MED ORDER — IOHEXOL 300 MG/ML  SOLN
75.0000 mL | Freq: Once | INTRAMUSCULAR | Status: AC | PRN
  Administered 2011-07-02: 75 mL via INTRAVENOUS

## 2011-07-02 NOTE — Telephone Encounter (Signed)
Pt has also called and is anxious to receive results, please call her at [phone]

## 2011-07-02 NOTE — Telephone Encounter (Signed)
Call report: CT shows 9 mm left upper lobe nodule and lymph nodes.

## 2011-07-02 NOTE — Telephone Encounter (Signed)
I spoke w/Dr Chestine Spore (radiol). I'll defer CT reprt discussion w/pt to Dr Debby Bud Thx

## 2011-07-04 ENCOUNTER — Other Ambulatory Visit: Payer: Self-pay | Admitting: Internal Medicine

## 2011-07-04 DIAGNOSIS — R590 Localized enlarged lymph nodes: Secondary | ICD-10-CM

## 2011-07-05 ENCOUNTER — Ambulatory Visit: Payer: Medicare Other | Admitting: Internal Medicine

## 2011-07-05 ENCOUNTER — Ambulatory Visit (INDEPENDENT_AMBULATORY_CARE_PROVIDER_SITE_OTHER): Payer: Medicare Other | Admitting: Internal Medicine

## 2011-07-05 ENCOUNTER — Telehealth: Payer: Self-pay | Admitting: Internal Medicine

## 2011-07-05 DIAGNOSIS — R599 Enlarged lymph nodes, unspecified: Secondary | ICD-10-CM

## 2011-07-05 NOTE — Progress Notes (Signed)
  Subjective:    Patient ID: Abigail Oliver, female    DOB: [birth year], 66 y.o.   MRN: [id number]  HPI Spoke with patient about her CT - lymphadenopathy right neck. For IR to do U/S guided biopsy.   Review of Systems     Objective:   Physical Exam        Assessment & Plan:

## 2011-07-05 NOTE — Telephone Encounter (Signed)
Message copied by Jacques Navy on Mon Jul 05, 2011  8:42 AM ------      Message from: Jacques Navy      Created: Sun Jul 04, 2011  7:32 PM       Correction: will refer to interventional radiology for biopsy.

## 2011-07-05 NOTE — Telephone Encounter (Signed)
Eft message on cell phone - for IR referral for biopsy

## 2011-07-06 ENCOUNTER — Other Ambulatory Visit: Payer: Self-pay | Admitting: Radiology

## 2011-07-06 ENCOUNTER — Telehealth: Payer: Self-pay | Admitting: *Deleted

## 2011-07-06 NOTE — Telephone Encounter (Signed)
ERROR-order for Lovenox retracted by MD/SLS

## 2011-07-07 ENCOUNTER — Telehealth: Payer: Self-pay

## 2011-07-07 MED ORDER — ZOLPIDEM TARTRATE 10 MG PO TABS: 10.0000 mg | ORAL_TABLET | Freq: Every evening | ORAL | Status: DC | PRN

## 2011-07-07 MED ORDER — CITALOPRAM HYDROBROMIDE 20 MG PO TABS: 20.0000 mg | ORAL_TABLET | Freq: Every day | ORAL | Status: DC

## 2011-07-07 NOTE — Telephone Encounter (Signed)
Done

## 2011-07-07 NOTE — Telephone Encounter (Signed)
Ok x 5 

## 2011-07-07 NOTE — Telephone Encounter (Signed)
Pt is requesting refill of Zolpidem 10 mg tab, last filled 05/22/2011

## 2011-07-08 ENCOUNTER — Other Ambulatory Visit: Payer: Self-pay | Admitting: Radiology

## 2011-07-09 ENCOUNTER — Ambulatory Visit (HOSPITAL_COMMUNITY)
Admission: RE | Admit: 2011-07-09 | Discharge: 2011-07-09 | Disposition: A | Discharge status: Home / Self Care | Payer: Medicare Other | Source: Ambulatory Visit | Attending: Internal Medicine | Admitting: Internal Medicine

## 2011-07-09 ENCOUNTER — Other Ambulatory Visit: Payer: Self-pay | Admitting: Interventional Radiology

## 2011-07-09 DIAGNOSIS — R599 Enlarged lymph nodes, unspecified: Secondary | ICD-10-CM

## 2011-07-09 LAB — CBC
HCT: 37.8 % (ref 36.0–46.0)
Hemoglobin: 12.6 g/dL (ref 12.0–15.0)
MCH: 29.1 pg (ref 26.0–34.0)
MCHC: 33.3 g/dL (ref 30.0–36.0)
MCV: 87.3 fL (ref 78.0–100.0)
Platelets: 157 10*3/uL (ref 150–400)
RBC: 4.33 MIL/uL (ref 3.87–5.11)
RDW: 14.3 % (ref 11.5–15.5)
WBC: 5.6 10*3/uL (ref 4.0–10.5)

## 2011-07-09 LAB — PROTIME-INR
INR: 1.23 (ref 0.00–1.49)
Prothrombin Time: 15.8 seconds — ABNORMAL HIGH (ref 11.6–15.2)

## 2011-07-09 LAB — GLUCOSE, CAPILLARY: Glucose-Capillary: 121 mg/dL — ABNORMAL HIGH (ref 70–99)

## 2011-07-09 MED ORDER — MIDAZOLAM HCL 5 MG/5ML IJ SOLN
INTRAMUSCULAR | Status: AC | PRN
  Administered 2011-07-09: 2 mg via INTRAVENOUS
  Administered 2011-07-09 (×2): 1 mg via INTRAVENOUS

## 2011-07-09 MED ORDER — FENTANYL CITRATE 0.05 MG/ML IJ SOLN
INTRAMUSCULAR | Status: AC
  Filled 2011-07-09: qty 4

## 2011-07-09 MED ORDER — FENTANYL CITRATE 0.05 MG/ML IJ SOLN
INTRAMUSCULAR | Status: AC | PRN
  Administered 2011-07-09: 50 ug via INTRAVENOUS
  Administered 2011-07-09: 25 ug via INTRAVENOUS
  Administered 2011-07-09: 100 ug via INTRAVENOUS

## 2011-07-09 MED ORDER — MIDAZOLAM HCL 2 MG/2ML IJ SOLN
INTRAMUSCULAR | Status: AC
  Filled 2011-07-09: qty 4

## 2011-07-09 MED ORDER — SODIUM CHLORIDE 0.9 % IV SOLN: INTRAVENOUS | Status: DC

## 2011-07-09 NOTE — Procedures (Signed)
Technically successful US guided biopsy of enlarged right cervical lymph node.  No immediate complications.  

## 2011-07-09 NOTE — Discharge Instructions (Signed)
Biopsy Care After Refer to this sheet in the next few weeks. These instructions provide you with information on caring for yourself after your procedure. Your caregiver may also give you more specific instructions. Your treatment has been planned according to current medical practices, but problems sometimes occur. Call your caregiver if you have any problems or questions after your procedure. If you had a fine needle biopsy, you may have soreness at the biopsy site for 1 to 2 days. If you had an open biopsy, you may have soreness at the biopsy site for 3 to 4 days. HOME CARE INSTRUCTIONS   You may resume normal diet and activities as directed.   Change bandages (dressings) as directed. If your wound was closed with a skin glue (adhesive), it will wear off and begin to peel in 7 days.   Only take over-the-counter or prescription medicines for pain, discomfort, or fever as directed by your caregiver.   Ask your caregiver when you can bathe and get your wound wet.  SEEK IMMEDIATE MEDICAL CARE IF:   You have increased bleeding (more than a small spot) from the biopsy site.   You notice redness, swelling, or increasing pain at the biopsy site.   You have pus coming from the biopsy site.   You have a fever.   You notice a bad smell coming from the biopsy site or dressing.   You have a rash, have difficulty breathing, or have any allergic problems.  MAKE SURE YOU:   Understand these instructions.   Will watch your condition.   Will get help right away if you are not doing well or get worse.  Document Released: 11/27/2004 Document Revised: 01/20/2011 Document Reviewed: 11/05/2010 Exit[employer] Patient Information 2012 [employer]. 

## 2011-07-12 ENCOUNTER — Telehealth: Payer: Self-pay | Admitting: Internal Medicine

## 2011-07-12 ENCOUNTER — Telehealth: Payer: Self-pay

## 2011-07-12 ENCOUNTER — Ambulatory Visit: Payer: Medicare Other | Admitting: Internal Medicine

## 2011-07-12 ENCOUNTER — Ambulatory Visit (INDEPENDENT_AMBULATORY_CARE_PROVIDER_SITE_OTHER): Payer: Medicare Other | Admitting: Internal Medicine

## 2011-07-12 ENCOUNTER — Encounter: Payer: Self-pay | Admitting: Internal Medicine

## 2011-07-12 VITALS — BP 138/82 | HR 104 | Temp 98.2°F | Resp 16 | Wt 196.0 lb

## 2011-07-12 DIAGNOSIS — R220 Localized swelling, mass and lump, head: Secondary | ICD-10-CM | POA: Diagnosis not present

## 2011-07-12 DIAGNOSIS — R221 Localized swelling, mass and lump, neck: Secondary | ICD-10-CM | POA: Diagnosis not present

## 2011-07-12 MED ORDER — HYDROCODONE-ACETAMINOPHEN 5-325 MG PO TABS: 1.0000 | ORAL_TABLET | ORAL | Status: AC | PRN

## 2011-07-12 NOTE — Patient Instructions (Signed)
Bled into the lymp node. There was not enough tissue for making a diagnosis. Plan - warm compresses           Hydrocodone/APAP 5/325 every 4 hours as needed           ENT - [facility] ENT doctor of the day tomorrow

## 2011-07-12 NOTE — Telephone Encounter (Signed)
[facility] Triage Call Report Triage Record Num: [id number] Operator: Lodema Pilot Patient Name: Abigail Oliver Call Date & Time: 07/10/2011 6:31:15PM Patient Phone: [phone] PCP: Illene Regulus Patient Gender: Female PCP Fax : [phone] Patient DOB: 03-Aug-1945 [facility] Name[facility] Reason for Call: Caller: [name]/Patient; PCP: Illene Regulus; CB#: [phone]; Call Reason: Neck Pain; Sx Onset: 07/09/2011; Sx Notes: Neck pain radiating right jaw, teeth, ear, Thumping headache. Pt had a Biopy - Lymphoma on 07/09/11. ; Afebrile; Wt: ; Guideline Used: Neck Pain or Injury; Disp: See MD w/in 72 hours - Spoke to Dr. Para March - Rip Harbour to take Tramadol 50 mg PO TID PRN pain #20, NR. If pain worsens, have chills, fever, drainage from site - go to ER.; Appt Scheduled?: No. Care advice given. Protocol(s) Used: Neck Pain or Injury Recommended Outcome per Protocol: See Provider within 72 Hours Reason for Outcome: Neck pain and decreased range of motion in neck, shoulder, or arm AND has not responded to 48 hours of home care Care Advice: ~ Consistently use home care measures for at least one day. If symptoms do not improve, see your provider. ~ SYMPTOM / CONDITION MANAGEMENT ~ CAUTIONS Apply local moist heat (such as a warm, wet wash cloth covered with plastic wrap) to the area for 15-20 minutes every 2-3 hours while awake. ~ 07/10/2011 7:04:02PM Page 1 of 1 CAN_TriageRpt_V2

## 2011-07-12 NOTE — Telephone Encounter (Signed)
May be added to today's schedule if needed.

## 2011-07-13 ENCOUNTER — Telehealth: Payer: Self-pay | Admitting: Internal Medicine

## 2011-07-13 ENCOUNTER — Telehealth (HOSPITAL_COMMUNITY): Payer: Self-pay

## 2011-07-13 DIAGNOSIS — K056 Periodontal disease, unspecified: Secondary | ICD-10-CM | POA: Diagnosis not present

## 2011-07-13 DIAGNOSIS — R599 Enlarged lymph nodes, unspecified: Secondary | ICD-10-CM | POA: Diagnosis not present

## 2011-07-13 DIAGNOSIS — D689 Coagulation defect, unspecified: Secondary | ICD-10-CM | POA: Diagnosis not present

## 2011-07-13 DIAGNOSIS — K069 Disorder of gingiva and edentulous alveolar ridge, unspecified: Secondary | ICD-10-CM | POA: Diagnosis not present

## 2011-07-13 DIAGNOSIS — E1169 Type 2 diabetes mellitus with other specified complication: Secondary | ICD-10-CM | POA: Diagnosis not present

## 2011-07-13 NOTE — Progress Notes (Signed)
Subjective:    Patient ID: Abigail Oliver, female    DOB: 06-07-1945, 66 y.o.   MRN: [id number]  HPI Abigail Oliver returns after needle biospy of right neck lymph node. After the procedure she has had marked swelling at the biopsy site with a very firm mass and significant pain. Reviewed path report - insufficient material for diagnosis. Pathologist recommended excisional biopsy.  Past Medical History  Diagnosis Date  . Coronary atherosclerosis of native coronary artery   . Diabetes mellitus   . Hyperlipidemia   . Chronic angle-closure glaucoma   . Aortic valve disorders    Past Surgical History  Procedure Date  . Aortic valve replacement 2000  . Carpal tunnel release 1989  . Cholecystectomy 1990  . Abdominal hysterectomy 1985    Fibroids  . Oophorectomy 1987  . Ulnar nerve release 1989   Family History  Problem Relation Age of Onset  . Cancer Mother     Colon  . COPD Mother   . Emphysema Mother   . Diabetes Neg Hx    History   Social History  . Marital Status: Married    Spouse Name: N/A    Number of Children: N/A  . Years of Education: N/A   Occupational History  . Not on file.   Social History Main Topics  . Smoking status: Never Smoker   . Smokeless tobacco: Not on file  . Alcohol Use: No  . Drug Use: No  . Sexually Active: Not Currently   Other Topics Concern  . Not on file   Social History Narrative   Pt is a high school graduate with 2 years of college. Married in 1967 she has 1 son born 35 and 1 daughter born 67 and 1 grandchild. 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   Current Outpatient Prescriptions on File Prior to Visit  Medication Sig Dispense Refill  . Ascorbic Acid (VITAMIN C) 500 MG tablet Take 500 mg by mouth daily.        Marland Kitchen aspirin EC 81 MG tablet Take 81 mg by mouth daily.      . brimonidine (ALPHAGAN P) 0.1 % SOLN Place 1 drop into both eyes 2 (two) times daily.        . calcium carbonate (OS-CAL) 600 MG  TABS Take 600 mg by mouth daily.       . Cholecalciferol (VITAMIN D) 1000 UNITS capsule Take 1,000 Units by mouth daily.        . citalopram (CELEXA) 20 MG tablet Take 1 tablet (20 mg total) by mouth daily.  30 tablet  6  . cyclobenzaprine (FLEXERIL) 10 MG tablet Take 5 mg by mouth 3 (three) times daily as needed. For muscle spasms      . enoxaparin (LOVENOX) 80 MG/0.8ML SOLN injection Inject 80 mg into the skin See admin instructions. Take 1 injection twice a day on 2-13 then 1 injection  on 2-14.      . folic acid (FOLVITE) 1 MG tablet Take 1 mg by mouth daily.        Marland Kitchen glucose blood (CONTOUR NEXT) test strip Test once daily Dx: 250.0  100 each  12  . Lancets MISC Test once daily  Please provide lancet of choice covered by insurance  100 each  11  . latanoprost (XALATAN) 0.005 % ophthalmic solution Place 1 drop into both eyes at bedtime.        . metFORMIN (GLUCOPHAGE) 500 MG  tablet Take 1 tablet (500 mg total) by mouth 2 (two) times daily with a meal.  60 tablet  11  . metoprolol (TOPROL-XL) 50 MG 24 hr tablet Take 1 tablet (50 mg total) by mouth daily.  30 tablet  6  . potassium chloride (K-DUR,KLOR-CON) 10 MEQ tablet Take 10 mEq by mouth daily.      . rosuvastatin (CRESTOR) 10 MG tablet Take 10 mg by mouth at bedtime.      . torsemide (DEMADEX) 20 MG tablet Take 1 tablet (20 mg total) by mouth 2 (two) times daily.  60 tablet  3  . warfarin (COUMADIN) 4 MG tablet Take 4 mg by mouth daily.       Marland Kitchen zolpidem (AMBIEN) 10 MG tablet Take 1 tablet (10 mg total) by mouth at bedtime as needed for sleep.  30 tablet  5  . zolpidem (AMBIEN) 10 MG tablet Take 1 tablet (10 mg total) by mouth at bedtime as needed for sleep.  30 tablet  0       Review of Systems System review is negative for any constitutional, cardiac, pulmonary, GI or neuro symptoms or complaints other than as described in the HPI.     Objective:   Physical Exam Filed Vitals:   07/12/11 1611  BP: 138/82  Pulse: 104  Temp:  98.2 F (36.8 C)  Resp: 16   Gen'l - WNWD white woman in discomfort HEENT- C&S clear Neck - 3 cm hard, indurated mass right neck that is very tender. There is surrounding evidence of subcutaneous hemorrhage. Pulm - normal respirations Cor - RRR       Assessment & Plan:

## 2011-07-13 NOTE — Telephone Encounter (Signed)
Called patient: Dr. Lazarus Salines thinks she has an infected tooth as cause of adenopathy.  She is for extraction tomorrow. Note written to confirm that she is not on coumadin.

## 2011-07-13 NOTE — Assessment & Plan Note (Signed)
Patient lymphadenopathy neck suspicious for lymphoma s/p needle biopsy. It appears she has bled into the lymph node causing swelling, induration and pain. Furthermore, the biopsy was not diagnostic.  Plan - urgent referral to ENT for help in management and further diagnosis while she is off coumadin, on lovenox           Warm compresses           norco 5/325 q 4 prn pain.

## 2011-07-19 ENCOUNTER — Telehealth: Payer: Self-pay | Admitting: Internal Medicine

## 2011-07-19 ENCOUNTER — Encounter: Payer: Self-pay | Admitting: Internal Medicine

## 2011-07-19 ENCOUNTER — Ambulatory Visit (INDEPENDENT_AMBULATORY_CARE_PROVIDER_SITE_OTHER): Payer: Medicare Other | Admitting: Internal Medicine

## 2011-07-19 VITALS — BP 138/78 | HR 89 | Temp 97.9°F | Resp 16 | Wt 198.5 lb

## 2011-07-19 DIAGNOSIS — I359 Nonrheumatic aortic valve disorder, unspecified: Secondary | ICD-10-CM | POA: Diagnosis not present

## 2011-07-19 DIAGNOSIS — Z7901 Long term (current) use of anticoagulants: Secondary | ICD-10-CM | POA: Diagnosis not present

## 2011-07-19 DIAGNOSIS — R221 Localized swelling, mass and lump, neck: Secondary | ICD-10-CM

## 2011-07-19 DIAGNOSIS — R220 Localized swelling, mass and lump, head: Secondary | ICD-10-CM

## 2011-07-19 NOTE — Telephone Encounter (Signed)
Ok to work her in this PM

## 2011-07-19 NOTE — Telephone Encounter (Signed)
The pt called and is hoping to get an appt today due to a injection site bleeding.  She states any time she stands up, a small amount of blood appears.  She was offered an appt with a different dr, but refused in hopes she can see you.  Can she be worked in?    Thanks!

## 2011-07-19 NOTE — Telephone Encounter (Signed)
Received four pages from [facility] ear, nose and throat, forwarded to Dr. Debby Bud on 07/19/11.

## 2011-07-20 NOTE — Progress Notes (Signed)
  Subjective:    Patient ID: Abigail Oliver, female    DOB: 20-Dec-1945, 66 y.o.   MRN: [id number]  HPI See previous recent notes. Abigail Oliver is concerned about lymph node swelling at the site of needle biopsy. She has had tooth extraction and neck is less painful. She was to restart coumadin today.  PMH, FamHx and SocHx reviewed for any changes and relevance.    Review of Systems System review is negative for any constitutional, cardiac, pulmonary, GI or neuro symptoms or complaints other than as described in the HPI.     Objective:   Physical Exam Filed Vitals:   07/19/11 1708  BP: 138/78  Pulse: 89  Temp: 97.9 F (36.6 C)  Resp: 16   Nodes - large lymph node just below the ear is smaller and less tender.       Assessment & Plan:  lympadenopathy - she probably hemorrhaged into the lymph node that was biopsied. This has gone down as has some of the swelling in her neck. She is finishing a course of amoxicillin from Dr. Lazarus Salines. She did have tooth extraction.  Plan- return to Dr. Lazarus Salines as planned. At that time he will reassess her and determine if excisional biopsy is indicated. Hopefully all her lymphadenopathy was related to dental infection.

## 2011-08-02 DIAGNOSIS — I359 Nonrheumatic aortic valve disorder, unspecified: Secondary | ICD-10-CM | POA: Diagnosis not present

## 2011-08-02 DIAGNOSIS — Z7901 Long term (current) use of anticoagulants: Secondary | ICD-10-CM | POA: Diagnosis not present

## 2011-08-11 DIAGNOSIS — K069 Disorder of gingiva and edentulous alveolar ridge, unspecified: Secondary | ICD-10-CM | POA: Diagnosis not present

## 2011-08-11 DIAGNOSIS — K056 Periodontal disease, unspecified: Secondary | ICD-10-CM | POA: Diagnosis not present

## 2011-08-11 DIAGNOSIS — R599 Enlarged lymph nodes, unspecified: Secondary | ICD-10-CM | POA: Diagnosis not present

## 2011-08-12 ENCOUNTER — Telehealth: Payer: Self-pay | Admitting: Internal Medicine

## 2011-08-12 NOTE — Telephone Encounter (Signed)
Received copies from [facility], [facility]ose [facility]ro[facility] ,on 08/12/11 . Forwarded 3 pages to Dr. Debby Bud ,for review.

## 2011-08-18 ENCOUNTER — Other Ambulatory Visit: Payer: Self-pay | Admitting: *Deleted

## 2011-08-18 MED ORDER — GLUCOSE BLOOD VI STRP: ORAL_STRIP | Status: DC

## 2011-08-18 MED ORDER — ONETOUCH ULTRASOFT LANCETS MISC: Status: DC

## 2011-08-18 NOTE — Telephone Encounter (Signed)
Pharmacy request diabetic supplies for pt's OneTouch Ultra. Done.

## 2011-08-25 DIAGNOSIS — E119 Type 2 diabetes mellitus without complications: Secondary | ICD-10-CM | POA: Diagnosis not present

## 2011-08-25 DIAGNOSIS — R42 Dizziness and giddiness: Secondary | ICD-10-CM | POA: Diagnosis not present

## 2011-08-25 DIAGNOSIS — R0602 Shortness of breath: Secondary | ICD-10-CM | POA: Diagnosis not present

## 2011-08-25 DIAGNOSIS — I2510 Atherosclerotic heart disease of native coronary artery without angina pectoris: Secondary | ICD-10-CM | POA: Diagnosis not present

## 2011-08-25 HISTORY — PX: CARDIOVASCULAR STRESS TEST: SHX262

## 2011-08-30 DIAGNOSIS — I2510 Atherosclerotic heart disease of native coronary artery without angina pectoris: Secondary | ICD-10-CM | POA: Diagnosis not present

## 2011-08-30 DIAGNOSIS — E782 Mixed hyperlipidemia: Secondary | ICD-10-CM | POA: Diagnosis not present

## 2011-08-30 DIAGNOSIS — Z7901 Long term (current) use of anticoagulants: Secondary | ICD-10-CM | POA: Diagnosis not present

## 2011-08-30 DIAGNOSIS — I119 Hypertensive heart disease without heart failure: Secondary | ICD-10-CM | POA: Diagnosis not present

## 2011-08-30 DIAGNOSIS — I359 Nonrheumatic aortic valve disorder, unspecified: Secondary | ICD-10-CM | POA: Diagnosis not present

## 2011-08-31 DIAGNOSIS — I70219 Atherosclerosis of native arteries of extremities with intermittent claudication, unspecified extremity: Secondary | ICD-10-CM | POA: Diagnosis not present

## 2011-08-31 DIAGNOSIS — I739 Peripheral vascular disease, unspecified: Secondary | ICD-10-CM | POA: Diagnosis not present

## 2011-09-06 ENCOUNTER — Other Ambulatory Visit: Payer: Self-pay | Admitting: Otolaryngology

## 2011-09-06 DIAGNOSIS — I739 Peripheral vascular disease, unspecified: Secondary | ICD-10-CM | POA: Diagnosis not present

## 2011-09-06 DIAGNOSIS — E119 Type 2 diabetes mellitus without complications: Secondary | ICD-10-CM | POA: Diagnosis not present

## 2011-09-06 DIAGNOSIS — I2510 Atherosclerotic heart disease of native coronary artery without angina pectoris: Secondary | ICD-10-CM | POA: Diagnosis not present

## 2011-09-06 DIAGNOSIS — R599 Enlarged lymph nodes, unspecified: Secondary | ICD-10-CM | POA: Diagnosis not present

## 2011-09-06 DIAGNOSIS — R591 Generalized enlarged lymph nodes: Secondary | ICD-10-CM

## 2011-09-07 ENCOUNTER — Other Ambulatory Visit: Payer: Self-pay | Admitting: Cardiovascular Disease

## 2011-09-07 ENCOUNTER — Telehealth: Payer: Self-pay | Admitting: Internal Medicine

## 2011-09-07 NOTE — Telephone Encounter (Signed)
Received copies from   [facility],on4/16/13. Forwarded 3 pages to Dr. Reva Bores review.

## 2011-09-08 ENCOUNTER — Encounter (HOSPITAL_COMMUNITY): Payer: Self-pay | Admitting: Pharmacy Technician

## 2011-09-13 ENCOUNTER — Encounter (HOSPITAL_COMMUNITY): Payer: Self-pay | Admitting: Pharmacy Technician

## 2011-09-15 DIAGNOSIS — R5381 Other malaise: Secondary | ICD-10-CM | POA: Diagnosis not present

## 2011-09-15 DIAGNOSIS — D689 Coagulation defect, unspecified: Secondary | ICD-10-CM | POA: Diagnosis not present

## 2011-09-15 DIAGNOSIS — Z79899 Other long term (current) drug therapy: Secondary | ICD-10-CM | POA: Diagnosis not present

## 2011-09-20 ENCOUNTER — Ambulatory Visit (HOSPITAL_COMMUNITY)
Admission: RE | Admit: 2011-09-20 | Discharge: 2011-09-21 | Disposition: A | Discharge status: Home / Self Care | Payer: Medicare Other | Source: Ambulatory Visit | Attending: Cardiovascular Disease | Admitting: Cardiovascular Disease

## 2011-09-20 ENCOUNTER — Encounter (HOSPITAL_COMMUNITY): Payer: Self-pay | Admitting: General Practice

## 2011-09-20 ENCOUNTER — Encounter (HOSPITAL_COMMUNITY)
Admission: RE | Disposition: A | Discharge status: Home / Self Care | Payer: Self-pay | Source: Ambulatory Visit | Attending: Cardiovascular Disease

## 2011-09-20 DIAGNOSIS — Z7901 Long term (current) use of anticoagulants: Secondary | ICD-10-CM | POA: Diagnosis not present

## 2011-09-20 DIAGNOSIS — E785 Hyperlipidemia, unspecified: Secondary | ICD-10-CM | POA: Diagnosis not present

## 2011-09-20 DIAGNOSIS — I10 Essential (primary) hypertension: Secondary | ICD-10-CM | POA: Insufficient documentation

## 2011-09-20 DIAGNOSIS — I5189 Other ill-defined heart diseases: Secondary | ICD-10-CM | POA: Diagnosis present

## 2011-09-20 DIAGNOSIS — I739 Peripheral vascular disease, unspecified: Secondary | ICD-10-CM | POA: Diagnosis present

## 2011-09-20 DIAGNOSIS — I70219 Atherosclerosis of native arteries of extremities with intermittent claudication, unspecified extremity: Secondary | ICD-10-CM | POA: Diagnosis not present

## 2011-09-20 DIAGNOSIS — Z954 Presence of other heart-valve replacement: Secondary | ICD-10-CM | POA: Insufficient documentation

## 2011-09-20 DIAGNOSIS — I2510 Atherosclerotic heart disease of native coronary artery without angina pectoris: Secondary | ICD-10-CM | POA: Diagnosis not present

## 2011-09-20 DIAGNOSIS — I519 Heart disease, unspecified: Secondary | ICD-10-CM | POA: Diagnosis not present

## 2011-09-20 DIAGNOSIS — Z952 Presence of prosthetic heart valve: Secondary | ICD-10-CM

## 2011-09-20 DIAGNOSIS — E1143 Type 2 diabetes mellitus with diabetic autonomic (poly)neuropathy: Secondary | ICD-10-CM | POA: Diagnosis present

## 2011-09-20 DIAGNOSIS — E119 Type 2 diabetes mellitus without complications: Secondary | ICD-10-CM | POA: Insufficient documentation

## 2011-09-20 HISTORY — PX: LOWER EXTREMITY ANGIOGRAM: SHX5508

## 2011-09-20 HISTORY — DX: Peripheral vascular disease, unspecified: I73.9

## 2011-09-20 HISTORY — PX: FEMORAL ARTERY STENT: SHX1583

## 2011-09-20 HISTORY — DX: Type 2 diabetes mellitus without complications: E11.9

## 2011-09-20 HISTORY — DX: Depression, unspecified: F32.A

## 2011-09-20 HISTORY — DX: Major depressive disorder, single episode, unspecified: F32.9

## 2011-09-20 HISTORY — PX: PERIPHERAL ARTERIAL STENT GRAFT: SHX2220

## 2011-09-20 LAB — GLUCOSE, CAPILLARY
Glucose-Capillary: 136 mg/dL — ABNORMAL HIGH (ref 70–99)
Glucose-Capillary: 127 mg/dL — ABNORMAL HIGH (ref 70–99)
Glucose-Capillary: 89 mg/dL (ref 70–99)
Glucose-Capillary: 141 mg/dL — ABNORMAL HIGH (ref 70–99)

## 2011-09-20 LAB — PROTIME-INR
INR: 1.18 (ref 0.00–1.49)
Prothrombin Time: 15.3 seconds — ABNORMAL HIGH (ref 11.6–15.2)

## 2011-09-20 LAB — POCT ACTIVATED CLOTTING TIME: Activated Clotting Time: 254 seconds

## 2011-09-20 SURGERY — ANGIOGRAM, LOWER EXTREMITY
Anesthesia: LOCAL

## 2011-09-20 MED ORDER — ACETAMINOPHEN 325 MG PO TABS: 650.0000 mg | ORAL_TABLET | ORAL | Status: DC | PRN

## 2011-09-20 MED ORDER — SODIUM CHLORIDE 0.9 % IV SOLN
INTRAVENOUS | Status: DC
  Administered 2011-09-20: 08:00:00 via INTRAVENOUS

## 2011-09-20 MED ORDER — FENTANYL CITRATE 0.05 MG/ML IJ SOLN
INTRAMUSCULAR | Status: AC
  Filled 2011-09-20: qty 2

## 2011-09-20 MED ORDER — BRIMONIDINE TARTRATE 0.2 % OP SOLN
1.0000 [drp] | Freq: Two times a day (BID) | OPHTHALMIC | Status: DC
  Administered 2011-09-20 – 2011-09-21 (×2): 1 [drp] via OPHTHALMIC
  Filled 2011-09-20: qty 5

## 2011-09-20 MED ORDER — TRAMADOL HCL 50 MG PO TABS
50.0000 mg | ORAL_TABLET | Freq: Three times a day (TID) | ORAL | Status: DC | PRN
  Administered 2011-09-20: 50 mg via ORAL
  Filled 2011-09-20: qty 1

## 2011-09-20 MED ORDER — ASPIRIN EC 81 MG PO TBEC
81.0000 mg | DELAYED_RELEASE_TABLET | Freq: Every day | ORAL | Status: DC
  Administered 2011-09-20 – 2011-09-21 (×2): 81 mg via ORAL
  Filled 2011-09-20 (×2): qty 1

## 2011-09-20 MED ORDER — HEPARIN (PORCINE) IN NACL 2-0.9 UNIT/ML-% IJ SOLN
INTRAMUSCULAR | Status: AC
  Filled 2011-09-20: qty 1000

## 2011-09-20 MED ORDER — ZOLPIDEM TARTRATE 5 MG PO TABS: 10.0000 mg | ORAL_TABLET | Freq: Every evening | ORAL | Status: DC | PRN

## 2011-09-20 MED ORDER — WARFARIN SODIUM 3 MG PO TABS
3.0000 mg | ORAL_TABLET | Freq: Every day | ORAL | Status: DC
  Administered 2011-09-20: 3 mg via ORAL
  Filled 2011-09-20 (×2): qty 1

## 2011-09-20 MED ORDER — CITALOPRAM HYDROBROMIDE 20 MG PO TABS
20.0000 mg | ORAL_TABLET | Freq: Every day | ORAL | Status: DC
  Administered 2011-09-20 – 2011-09-21 (×2): 20 mg via ORAL
  Filled 2011-09-20 (×2): qty 1

## 2011-09-20 MED ORDER — ATORVASTATIN CALCIUM 40 MG PO TABS
40.0000 mg | ORAL_TABLET | Freq: Every day | ORAL | Status: DC
  Administered 2011-09-20: 40 mg via ORAL
  Filled 2011-09-20 (×2): qty 1

## 2011-09-20 MED ORDER — DIAZEPAM 5 MG PO TABS
5.0000 mg | ORAL_TABLET | ORAL | Status: AC
  Administered 2011-09-20: 5 mg via ORAL
  Filled 2011-09-20: qty 1

## 2011-09-20 MED ORDER — SODIUM CHLORIDE 0.9 % IJ SOLN: 3.0000 mL | INTRAMUSCULAR | Status: DC | PRN

## 2011-09-20 MED ORDER — ENOXAPARIN SODIUM 100 MG/ML ~~LOC~~ SOLN
85.0000 mg | Freq: Two times a day (BID) | SUBCUTANEOUS | Status: DC
  Administered 2011-09-21: 85 mg via SUBCUTANEOUS
  Filled 2011-09-20 (×3): qty 1

## 2011-09-20 MED ORDER — BRIMONIDINE TARTRATE 0.1 % OP SOLN
1.0000 [drp] | Freq: Two times a day (BID) | OPHTHALMIC | Status: DC
Start: 2011-09-20 — End: 2011-09-20

## 2011-09-20 MED ORDER — LIDOCAINE HCL (PF) 1 % IJ SOLN
INTRAMUSCULAR | Status: AC
  Filled 2011-09-20: qty 30

## 2011-09-20 MED ORDER — MIDAZOLAM HCL 2 MG/2ML IJ SOLN
INTRAMUSCULAR | Status: AC
  Filled 2011-09-20: qty 2

## 2011-09-20 MED ORDER — POTASSIUM CHLORIDE CRYS ER 10 MEQ PO TBCR
10.0000 meq | EXTENDED_RELEASE_TABLET | Freq: Every day | ORAL | Status: DC
  Administered 2011-09-20 – 2011-09-21 (×2): 10 meq via ORAL
  Filled 2011-09-20 (×2): qty 1

## 2011-09-20 MED ORDER — HYDRALAZINE HCL 20 MG/ML IJ SOLN: 10.0000 mg | INTRAMUSCULAR | Status: DC

## 2011-09-20 MED ORDER — ONDANSETRON HCL 4 MG/2ML IJ SOLN: 4.0000 mg | Freq: Four times a day (QID) | INTRAMUSCULAR | Status: DC | PRN

## 2011-09-20 MED ORDER — CYCLOBENZAPRINE HCL 10 MG PO TABS
10.0000 mg | ORAL_TABLET | Freq: Two times a day (BID) | ORAL | Status: DC | PRN
  Filled 2011-09-20: qty 1

## 2011-09-20 MED ORDER — WARFARIN - PHYSICIAN DOSING INPATIENT: Freq: Every day | Status: DC

## 2011-09-20 MED ORDER — HEPARIN SODIUM (PORCINE) 1000 UNIT/ML IJ SOLN
INTRAMUSCULAR | Status: AC
  Filled 2011-09-20: qty 1

## 2011-09-20 MED ORDER — TORSEMIDE 20 MG PO TABS
20.0000 mg | ORAL_TABLET | Freq: Every day | ORAL | Status: DC
  Administered 2011-09-20 – 2011-09-21 (×2): 20 mg via ORAL
  Filled 2011-09-20 (×2): qty 1

## 2011-09-20 MED ORDER — METOPROLOL SUCCINATE ER 50 MG PO TB24
50.0000 mg | ORAL_TABLET | Freq: Every day | ORAL | Status: DC
  Administered 2011-09-20 – 2011-09-21 (×2): 50 mg via ORAL
  Filled 2011-09-20 (×2): qty 1

## 2011-09-20 MED ORDER — LATANOPROST 0.005 % OP SOLN
1.0000 [drp] | Freq: Every day | OPHTHALMIC | Status: DC
  Administered 2011-09-20: 1 [drp] via OPHTHALMIC
  Filled 2011-09-20: qty 2.5

## 2011-09-20 MED ORDER — SODIUM CHLORIDE 0.9 % IV SOLN
INTRAVENOUS | Status: AC
  Administered 2011-09-20: 12:00:00 via INTRAVENOUS

## 2011-09-20 NOTE — Progress Notes (Signed)
ANTICOAGULATION CONSULT NOTE - Initial Consult  Pharmacy Consult for Lovenox  Indication: St. Jude AVR  Assessment: 66 yo female s/p successful stenting of the right SFA to resume Lovenox to Coumadin bridge for a St. Jude AVR. Lovenox is to start tomorrow morning per Dr. Allyson Sabal.  Goal of Therapy:  Heparin level 0.6-1.2   Plan:  1. Lovenox 85 mg SQ q12h - first dose 4/30 2. CBC q72h while on Lovenox 3. Coumadin per MD dosing   No Known Allergies  Patient Measurements: Height: 5\' 2"  (157.5 cm) Weight: 193 lb (87.544 kg) IBW/kg (Calculated) : 50.1   Vital Signs: Temp: 98.8 F (37.1 C) (04/29 1626) Temp src: Oral (04/29 1626) BP: 153/62 mmHg (04/29 1626) Pulse Rate: 86  (04/29 1626)  Labs:  Basename 09/20/11 0733  HGB --  HCT --  PLT --  APTT --  LABPROT 15.3*  INR 1.18  HEPARINUNFRC --  CREATININE --  CKTOTAL --  CKMB --  TROPONINI --   Estimated Creatinine Clearance: 71.1 ml/min (by C-G formula based on Cr of 0.8).  Medical History: Past Medical History  Diagnosis Date  . Coronary atherosclerosis of native coronary artery   . Hyperlipidemia   . Chronic angle-closure glaucoma   . Aortic valve disorders   . Peripheral vascular disease   . Type II diabetes mellitus   . Depression     Medications:  Prescriptions prior to admission  Medication Sig Dispense Refill  . Ascorbic Acid (VITAMIN C) 500 MG tablet Take 500 mg by mouth daily.        Marland Kitchen aspirin EC 81 MG tablet Take 81 mg by mouth daily.      . brimonidine (ALPHAGAN P) 0.1 % SOLN Place 1 drop into both eyes 2 (two) times daily.        . calcium-vitamin D (CALCIUM 500+D) 500-200 MG-UNIT per tablet Take 1 tablet by mouth daily.      . citalopram (CELEXA) 20 MG tablet Take 1 tablet (20 mg total) by mouth daily.  30 tablet  6  . cyclobenzaprine (FLEXERIL) 10 MG tablet Take 10 mg by mouth 2 (two) times daily as needed. For muscle spasms      . enoxaparin (LOVENOX) 80 MG/0.8ML SOLN injection Inject 80 mg  into the skin every 12 (twelve) hours.       . folic acid (FOLVITE) 1 MG tablet Take 1 mg by mouth daily.       Marland Kitchen latanoprost (XALATAN) 0.005 % ophthalmic solution Place 1 drop into both eyes at bedtime.        . metFORMIN (GLUCOPHAGE) 500 MG tablet Take 500 mg by mouth 2 (two) times daily with a meal.      . metoprolol (TOPROL-XL) 50 MG 24 hr tablet Take 1 tablet (50 mg total) by mouth daily.  30 tablet  6  . potassium chloride (K-DUR,KLOR-CON) 10 MEQ tablet Take 10 mEq by mouth daily.      . rosuvastatin (CRESTOR) 10 MG tablet Take 10 mg by mouth at bedtime.      . torsemide (DEMADEX) 20 MG tablet Take 20 mg by mouth daily.      . traMADol (ULTRAM) 50 MG tablet Take 50 mg by mouth every 8 (eight) hours as needed. For pain      . warfarin (COUMADIN) 3 MG tablet Take 3 mg by mouth daily.      Marland Kitchen zolpidem (AMBIEN) 10 MG tablet Take 10 mg by mouth at bedtime as needed. For sleep      .  glucose blood (ONE TOUCH ULTRA TEST) test strip Use as instructed once daily to test blood glucose. Dx: 250.0  100 each  12  . Lancets (ONETOUCH ULTRASOFT) lancets Use as instructed once daily to test blood glucose. Dx: 250.0  100 each  [address] [place] 09/20/2011,8:03 PM

## 2011-09-20 NOTE — Op Note (Signed)
Abigail Oliver is a 66 y.o. female    [id number] LOCATION:  FACILITY: [facility]  PHYSICIAN: Nanetta Batty, M.D. 01/03/1946   DATE OF PROCEDURE:  09/20/2011  DATE OF DISCHARGE:  [facility]  PV Intervention    History obtained from chart review. Ms. Casad is a 66 year old moderately overweight married Caucasian female mother of 2, grandmother and one grandchild Works as a Interior and spatial designer. She was referred to me through the courtesy of Dr. Daphene Jaeger for peripheral vascular evaluation because of lifestyle limiting claudication in her right leg. Her other problems include hypertension, hyperlipidemia and diabetes. She has had coronary bypass grafting in 2000 as well as aortic valve replacement with a St. Jude aortic valve. Her Myoview performed within the last several weeks with nonischemic. Motion he Dopplers performed earlier this month revealed a right ABI of 0.81 with a high-frequency signal in the mid right SFA the left ABI 1.1. She presents now for angiography and possible percutaneous intervention to lifestyle limiting claudication. She did do Lovenox bridging".   PROCEDURE DESCRIPTION:    The patient was brought to the second floor  [facility] lab in the postabsorptive state. She was  premedicated with Valium 5 mg by mouth. Her left groin was prepped and shaved in usual sterile fashion. Xylocaine 1% was used  for local anesthesia. A 5 French sheath was inserted into the left common femoral  artery using standard Seldinger technique. A 5 French catheters used for abdominal aortography with bifemoral runoff using bolus chase digital subtraction step table technique. Visipaque dye was used for the entirety of the case. Retrograde aorta pressures monitored in the case.  HEMODYNAMICS:    AO SYSTOLIC/AO DIASTOLIC: 181/72   ANGIOGRAPHIC RESULTS:   1: Abdominal aorta-renal arteries are widely patent. The infrarenal abdominal aorta and iliac  bifurcation were free of significant atherosclerotic disease.  2: Left lower extremity-normal with three-vessel runoff  3: Right lower extremity-80-90% focal mid right SFA stenosis with three-vessel runoff   IMPRESSION:Ms. Monica has a high-grade mid right SFA stenosis with lifestyle limiting claudication. We'll proceed with PTA and stenting using a Nitinol self expanding stent.  Procedure description: Contralateral access obtained with a Mortajime catheter ,0.35 mm Vericore wire and a 5 [place] KAMP catheter. 5000 units of heparin was administered to the patient with an ACT of 254. A total of 199 cc of contrast was administered to the patient. The wire easily crossed the lesion and predilatation performed with a 4 mm x 2 cm balloon. Following this a 6 mm x 40 mm long Smart Nitinol self-expanding stent was then carefully positioned and deployed, and then post dilated with a 5 mm x 3 centimeter balloon resulting in reduction of a 80-90% focal mid right SFA stenosis to 0% residual. The sheath was then withdrawn across the bifurcation and secured in place. The patient left the peripheral vascular lab in stable condition.  Final impression: successful PTA and stenting of a high-grade mid right SFA stenosis with a Nitinol self expanding stent to lifestyle limiting claudication. The patient will receive Lovenox and Coumadin for bridging because of herSt. Jude aortic valve and will be discharged home. She'll get followup Dopplers and we'll see back in the office. She'll be gently hydrated overnight. She left the lab in stable condition.  Runell Gess MD, [facility] 09/20/2011 10:14 AM

## 2011-09-20 NOTE — H&P (Signed)
H & P will be scanned in.  Pt was reexamined and existing H & P reviewed. No changes found.  Runell Gess, MD [facility] 09/20/2011 9:10 AM

## 2011-09-21 DIAGNOSIS — I70219 Atherosclerosis of native arteries of extremities with intermittent claudication, unspecified extremity: Secondary | ICD-10-CM | POA: Diagnosis not present

## 2011-09-21 DIAGNOSIS — Z952 Presence of prosthetic heart valve: Secondary | ICD-10-CM

## 2011-09-21 DIAGNOSIS — Z7901 Long term (current) use of anticoagulants: Secondary | ICD-10-CM

## 2011-09-21 DIAGNOSIS — I739 Peripheral vascular disease, unspecified: Secondary | ICD-10-CM | POA: Diagnosis present

## 2011-09-21 DIAGNOSIS — E785 Hyperlipidemia, unspecified: Secondary | ICD-10-CM | POA: Diagnosis present

## 2011-09-21 DIAGNOSIS — I5189 Other ill-defined heart diseases: Secondary | ICD-10-CM | POA: Diagnosis present

## 2011-09-21 DIAGNOSIS — I2510 Atherosclerotic heart disease of native coronary artery without angina pectoris: Secondary | ICD-10-CM | POA: Diagnosis present

## 2011-09-21 LAB — CBC
HCT: 38.2 % (ref 36.0–46.0)
Hemoglobin: 12.3 g/dL (ref 12.0–15.0)
MCH: 28.9 pg (ref 26.0–34.0)
MCHC: 32.2 g/dL (ref 30.0–36.0)
MCV: 89.7 fL (ref 78.0–100.0)
Platelets: 139 10*3/uL — ABNORMAL LOW (ref 150–400)
RBC: 4.26 MIL/uL (ref 3.87–5.11)
RDW: 15.3 % (ref 11.5–15.5)
WBC: 8.1 10*3/uL (ref 4.0–10.5)

## 2011-09-21 LAB — BASIC METABOLIC PANEL
BUN: 10 mg/dL (ref 6–23)
CO2: 28 mEq/L (ref 19–32)
Calcium: 9.5 mg/dL (ref 8.4–10.5)
Chloride: 102 mEq/L (ref 96–112)
Creatinine, Ser: 0.77 mg/dL (ref 0.50–1.10)
GFR calc Af Amer: >90 mL/min (ref >90)
GFR calc non Af Amer: 86 mL/min — ABNORMAL LOW (ref >90)
Glucose, Bld: 153 mg/dL — ABNORMAL HIGH (ref 70–99)
Potassium: 4.6 mEq/L (ref 3.5–5.1)
Sodium: 138 mEq/L (ref 135–145)

## 2011-09-21 LAB — POCT ACTIVATED CLOTTING TIME
Activated Clotting Time: 166 seconds
Activated Clotting Time: 182 seconds

## 2011-09-21 LAB — PROTIME-INR
INR: 1.18 (ref 0.00–1.49)
Prothrombin Time: 15.3 seconds — ABNORMAL HIGH (ref 11.6–15.2)

## 2011-09-21 LAB — GLUCOSE, CAPILLARY: Glucose-Capillary: 146 mg/dL — ABNORMAL HIGH (ref 70–99)

## 2011-09-21 MED ORDER — HYDROCORTISONE 1 % EX CREA: TOPICAL_CREAM | Freq: Four times a day (QID) | CUTANEOUS | Status: DC | PRN

## 2011-09-21 MED ORDER — ACETAMINOPHEN 325 MG PO TABS: 650.0000 mg | ORAL_TABLET | ORAL | Status: DC | PRN

## 2011-09-21 MED ORDER — DIPHENHYDRAMINE HCL 25 MG PO CAPS
25.0000 mg | ORAL_CAPSULE | Freq: Four times a day (QID) | ORAL | Status: DC | PRN
  Administered 2011-09-21: 25 mg via ORAL
  Filled 2011-09-21: qty 1

## 2011-09-21 MED ORDER — HYDROCORTISONE 1 % EX CREA
TOPICAL_CREAM | Freq: Four times a day (QID) | CUTANEOUS | Status: DC | PRN
  Administered 2011-09-21: 05:00:00 via TOPICAL
  Filled 2011-09-21: qty 28

## 2011-09-21 MED ORDER — DIPHENHYDRAMINE HCL 25 MG PO CAPS: 25.0000 mg | ORAL_CAPSULE | Freq: Four times a day (QID) | ORAL | Status: DC | PRN

## 2011-09-21 MED ORDER — METFORMIN HCL 500 MG PO TABS: 500.0000 mg | ORAL_TABLET | Freq: Two times a day (BID) | ORAL | Status: DC

## 2011-09-21 NOTE — Discharge Summary (Signed)
Patient ID: Abigail Oliver,  MRN: [id number], DOB/AGE: 1945-08-07 66 y.o.  Admit date: 09/20/2011 Discharge date: 09/21/2011  Primary Care Provider: Dr Arthur Holms Primary Cardiologist: Dr Tresa Endo  Discharge Diagnoses Principal Problem:  *Claudication Active Problems:  H/O prosthetic aortic valve replacement, St Jude 2000  PVD, Rt SFA PTA/Stent 09/20/11  Chronic anticoagulation  DM, Type 2, NIDDM  Dyslipidemia  CAD, CABG 2000, low risk Myoview April 2011  Diastolic dysfunction with NL LVF 2D August 2012    Procedures: Rt SFA stent 09/20/11   Hospital Course: 66 y/o pt of Dr Landry Dyke with a history of CAD and AS, s/p CABG and St Jude AVR in 2000. Myoview in April 2011 was low risk. Echo August 2012 showed good LVF with diastolic dysfunction. Recently she developed Rt leg claudication. OP dopplers suggested Rt SFA disease. She was taken off Coumadin, put on Lovenox, and admitted for PVA 09/20/11. This revealed mid Rt SFA disease that was stented by Dr Allyson Sabal with a good final result. She is discharged 09/21/11 on Lovenox to Coumadin. She will have an INR on Friday. She will see [place] back in a week for a groin check, dopplers in 3wks, Dr Allyson Sabal in one month and then back to Dr Tresa Endo for cardiology follow up. She has been instructed to hold her Metformin for 48hrs. She did develop a rash at discharge that could be a stress reaction or possibly a contact reaction. She does not appear to be on any new medications. It's noted that she is not on an ACE I and this can be addressed by her primary MD and Dr Tresa Endo as an OP. We do not want to add any new medications now with the pt having a rash of unknown origin.   Discharge Vitals:  Blood pressure 110/70, pulse 76, temperature 98.3 F (36.8 C), temperature source Oral, resp. rate 18, height 5\' 2"  (1.575 m), weight 90.1 kg (198 lb 10.2 oz), SpO2 98.00%.    Labs: Results for orders placed during the hospital encounter of 09/20/11 (from the past 48 hour(s))    GLUCOSE, CAPILLARY     Status: Abnormal   Collection Time   09/20/11  7:04 AM      Component Value Range Comment   Glucose-Capillary 136 (*) 70 - 99 (mg/dL)   PROTIME-INR     Status: Abnormal   Collection Time   09/20/11  7:33 AM      Component Value Range Comment   Prothrombin Time 15.3 (*) 11.6 - 15.2 (seconds)    INR 1.18  0.00 - 1.49    POCT ACTIVATED CLOTTING TIME     Status: Normal   Collection Time   09/20/11  9:39 AM      Component Value Range Comment   Activated Clotting Time 254     GLUCOSE, CAPILLARY     Status: Abnormal   Collection Time   09/20/11 10:16 AM      Component Value Range Comment   Glucose-Capillary 127 (*) 70 - 99 (mg/dL)   GLUCOSE, CAPILLARY     Status: Normal   Collection Time   09/20/11 12:26 PM      Component Value Range Comment   Glucose-Capillary 89  70 - 99 (mg/dL)    Comment 1 Notify RN     GLUCOSE, CAPILLARY     Status: Abnormal   Collection Time   09/20/11  4:24 PM      Component Value Range Comment   Glucose-Capillary 141 (*) 70 - 99 (mg/dL)  Comment 1 Notify RN     BASIC METABOLIC PANEL     Status: Abnormal   Collection Time   09/21/11  4:21 AM      Component Value Range Comment   Sodium 138  135 - 145 (mEq/L)    Potassium 4.6  3.5 - 5.1 (mEq/L)    Chloride 102  96 - 112 (mEq/L)    CO2 28  19 - 32 (mEq/L)    Glucose, Bld 153 (*) 70 - 99 (mg/dL)    BUN 10  6 - 23 (mg/dL)    Creatinine, Ser 1.61  0.50 - 1.10 (mg/dL)    Calcium 9.5  8.4 - 10.5 (mg/dL)    GFR calc non Af Amer 86 (*) >90 (mL/min)    GFR calc Af Amer >90  >90 (mL/min)   CBC     Status: Abnormal   Collection Time   09/21/11  4:21 AM      Component Value Range Comment   WBC 8.1  4.0 - 10.5 (K/uL)    RBC 4.26  3.87 - 5.11 (MIL/uL)    Hemoglobin 12.3  12.0 - 15.0 (g/dL)    HCT 09.6  04.5 - 40.9 (%)    MCV 89.7  78.0 - 100.0 (fL)    MCH 28.9  26.0 - 34.0 (pg)    MCHC 32.2  30.0 - 36.0 (g/dL)    RDW 81.1  91.4 - 78.2 (%)    Platelets 139 (*) 150 - 400 (K/uL)    PROTIME-INR     Status: Abnormal   Collection Time   09/21/11  4:21 AM      Component Value Range Comment   Prothrombin Time 15.3 (*) 11.6 - 15.2 (seconds)    INR 1.18  0.00 - 1.49    GLUCOSE, CAPILLARY     Status: Abnormal   Collection Time   09/21/11  7:55 AM      Component Value Range Comment   Glucose-Capillary 146 (*) 70 - 99 (mg/dL)     Disposition:    Discharge Medications:  Medication List  As of 09/21/2011 10:03 AM   ASK your doctor about these medications         ALPHAGAN P 0.1 % Soln   Generic drug: brimonidine   Place 1 drop into both eyes 2 (two) times daily.      aspirin EC 81 MG tablet   Take 81 mg by mouth daily.      CALCIUM 500+D 500-200 MG-UNIT per tablet   Generic drug: calcium-vitamin D   Take 1 tablet by mouth daily.      citalopram 20 MG tablet   Commonly known as: CELEXA   Take 1 tablet (20 mg total) by mouth daily.      cyclobenzaprine 10 MG tablet   Commonly known as: FLEXERIL   Take 10 mg by mouth 2 (two) times daily as needed. For muscle spasms      enoxaparin 80 MG/0.8ML injection   Commonly known as: LOVENOX   Inject 80 mg into the skin every 12 (twelve) hours.      folic acid 1 MG tablet   Commonly known as: FOLVITE   Take 1 mg by mouth daily.      glucose blood test strip   Use as instructed once daily to test blood glucose. Dx: 250.0      latanoprost 0.005 % ophthalmic solution   Commonly known as: XALATAN   Place 1 drop into both eyes at bedtime.  metFORMIN 500 MG tablet   Commonly known as: GLUCOPHAGE   Take 500 mg by mouth 2 (two) times daily with a meal.      metoprolol succinate 50 MG 24 hr tablet   Commonly known as: TOPROL-XL   Take 1 tablet (50 mg total) by mouth daily.      onetouch ultrasoft lancets   Use as instructed once daily to test blood glucose. Dx: 250.0      potassium chloride 10 MEQ tablet   Commonly known as: K-DUR,KLOR-CON   Take 10 mEq by mouth daily.      rosuvastatin 10 MG tablet    Commonly known as: CRESTOR   Take 10 mg by mouth at bedtime.      torsemide 20 MG tablet   Commonly known as: DEMADEX   Take 20 mg by mouth daily.      traMADol 50 MG tablet   Commonly known as: ULTRAM   Take 50 mg by mouth every 8 (eight) hours as needed. For pain      vitamin C 500 MG tablet   Commonly known as: ASCORBIC ACID   Take 500 mg by mouth daily.      warfarin 3 MG tablet   Commonly known as: COUMADIN   Take 3 mg by mouth daily.      zolpidem 10 MG tablet   Commonly known as: AMBIEN   Take 10 mg by mouth at bedtime as needed. For sleep            Outstanding Labs/Studies  Duration of Discharge Encounter: Greater than 30 minutes including physician time.  Jolene Provost PA-C 09/21/2011 10:03 AM

## 2011-09-21 NOTE — Progress Notes (Signed)
[facility]  DAILY PROGRESS NOTE   Subjective:  No events overnight. She is ambulating without leg pain.  Only issue is a new rash across her back and chest. It is pruritic, macular, pink and confluent.  Objective:  Temp:  [98.1 F (36.7 C)-98.8 F (37.1 C)] 98.3 F (36.8 C) (04/30 0822) Pulse Rate:  [67-87] 76  (04/30 0822) Resp:  [17-20] 18  (04/30 0822) BP: (103-153)/(51-125) 110/70 mmHg (04/30 0822) SpO2:  [97 %-100 %] 98 % (04/30 0822) Weight:  [90.1 kg (198 lb 10.2 oz)] 90.1 kg (198 lb 10.2 oz) (04/30 0500) Weight change:   Intake/Output from previous day: 04/29 0701 - 04/30 0700 In: 940 [P.O.:940] Out: 300 [Urine:300]  Intake/Output from this shift:    Medications: Current Facility-Administered Medications  Medication Dose Route Frequency Provider Last Rate Last Dose  . 0.9 %  sodium chloride infusion   Intravenous Continuous Runell Gess, MD 75 mL/hr at 09/20/11 1130    . acetaminophen (TYLENOL) tablet 650 mg  650 mg Oral Q4H PRN Runell Gess, MD      . aspirin EC tablet 81 mg  81 mg Oral Daily Runell Gess, MD   81 mg at 09/20/11 1650  . atorvastatin (LIPITOR) tablet 40 mg  40 mg Oral q1800 Runell Gess, MD   40 mg at 09/20/11 1654  . brimonidine (ALPHAGAN) 0.2 % ophthalmic solution 1 drop  1 drop Both Eyes BID Runell Gess, MD   1 drop at 09/20/11 2208  . citalopram (CELEXA) tablet 20 mg  20 mg Oral Daily Runell Gess, MD   20 mg at 09/20/11 1650  . cyclobenzaprine (FLEXERIL) tablet 10 mg  10 mg Oral BID PRN Runell Gess, MD      . diphenhydrAMINE (BENADRYL) capsule 25 mg  25 mg Oral Q6H PRN Dwana Melena, PA   25 mg at 09/21/11 0547  . enoxaparin (LOVENOX) injection 85 mg  85 mg Subcutaneous Q12H [facility] [place], [place]   85 mg at 09/21/11 4098  . fentaNYL (SUBLIMAZE) 0.05 MG/ML injection           . heparin 1000 UNIT/ML injection           . heparin 2-0.9 UNIT/ML-% infusion           . hydrALAZINE  (APRESOLINE) injection 10 mg  10 mg Intravenous UD Runell Gess, MD      . hydrocortisone cream 1 %   Topical QID PRN Runell Gess, MD      . latanoprost (XALATAN) 0.005 % ophthalmic solution 1 drop  1 drop Both Eyes QHS Runell Gess, MD   1 drop at 09/20/11 2208  . lidocaine (XYLOCAINE) 1 % injection           . metoprolol succinate (TOPROL-XL) 24 hr tablet 50 mg  50 mg Oral Daily Runell Gess, MD   50 mg at 09/20/11 1651  . midazolam (VERSED) 2 MG/2ML injection           . ondansetron (ZOFRAN) injection 4 mg  4 mg Intravenous Q6H PRN Runell Gess, MD      . potassium chloride (K-DUR,KLOR-CON) CR tablet 10 mEq  10 mEq Oral Daily Runell Gess, MD   10 mEq at 09/20/11 1651  . torsemide (DEMADEX) tablet 20 mg  20 mg Oral Daily Runell Gess, MD   20 mg at 09/20/11 1652  . traMADol (ULTRAM) tablet  50 mg  50 mg Oral Q8H PRN Runell Gess, MD   50 mg at 09/20/11 1131  . warfarin (COUMADIN) tablet 3 mg  3 mg Oral q1800 Runell Gess, MD   3 mg at 09/20/11 1938  . Warfarin - Physician Dosing Inpatient   Does not apply q1800 Runell Gess, MD      . zolpidem [facility]) tablet 10 mg  10 mg Oral QHS PRN Runell Gess, MD      . DISCONTD: 0.9 %  sodium chloride infusion   Intravenous Continuous Runell Gess, MD 75 mL/hr at 09/20/11 0731    . DISCONTD: brimonidine (ALPHAGAN P) 0.1 % ophthalmic solution 1 drop  1 drop Both Eyes BID Runell Gess, MD      . DISCONTD: sodium chloride 0.9 % injection 3 mL  3 mL Intravenous PRN Runell Gess, MD        Physical Exam: General appearance: alert and no distress Neck: no adenopathy, no carotid bruit, no JVD, supple, symmetrical, trachea midline and thyroid not enlarged, symmetric, no tenderness/mass/nodules Lungs: clear to auscultation bilaterally Heart: regular rate and rhythm, S1, S2 normal, no murmur, click, rub or gallop and mechanical valve sound Abdomen: soft, non-tender; bowel sounds normal; no masses,  no  organomegaly Extremities: 1-2+ pulses, no edema Pulses: 2+ and symmetric  Lab Results: Results for orders placed during the hospital encounter of 09/20/11 (from the past 48 hour(s))  GLUCOSE, CAPILLARY     Status: Abnormal   Collection Time   09/20/11  7:04 AM      Component Value Range Comment   Glucose-Capillary 136 (*) 70 - 99 (mg/dL)   PROTIME-INR     Status: Abnormal   Collection Time   09/20/11  7:33 AM      Component Value Range Comment   Prothrombin Time 15.3 (*) 11.6 - 15.2 (seconds)    INR 1.18  0.00 - 1.49    POCT ACTIVATED CLOTTING TIME     Status: Normal   Collection Time   09/20/11  9:39 AM      Component Value Range Comment   Activated Clotting Time 254     GLUCOSE, CAPILLARY     Status: Abnormal   Collection Time   09/20/11 10:16 AM      Component Value Range Comment   Glucose-Capillary 127 (*) 70 - 99 (mg/dL)   GLUCOSE, CAPILLARY     Status: Normal   Collection Time   09/20/11 12:26 PM      Component Value Range Comment   Glucose-Capillary 89  70 - 99 (mg/dL)    Comment 1 Notify RN     GLUCOSE, CAPILLARY     Status: Abnormal   Collection Time   09/20/11  4:24 PM      Component Value Range Comment   Glucose-Capillary 141 (*) 70 - 99 (mg/dL)    Comment 1 Notify RN     BASIC METABOLIC PANEL     Status: Abnormal   Collection Time   09/21/11  4:21 AM      Component Value Range Comment   Sodium 138  135 - 145 (mEq/L)    Potassium 4.6  3.5 - 5.1 (mEq/L)    Chloride 102  96 - 112 (mEq/L)    CO2 28  19 - 32 (mEq/L)    Glucose, Bld 153 (*) 70 - 99 (mg/dL)    BUN 10  6 - 23 (mg/dL)    Creatinine, Ser 4.09  0.50 - 1.10 (mg/dL)    Calcium 9.5  8.4 - 10.5 (mg/dL)    GFR calc non Af Amer 86 (*) >90 (mL/min)    GFR calc Af Amer >90  >90 (mL/min)   CBC     Status: Abnormal   Collection Time   09/21/11  4:21 AM      Component Value Range Comment   WBC 8.1  4.0 - 10.5 (K/uL)    RBC 4.26  3.87 - 5.11 (MIL/uL)    Hemoglobin 12.3  12.0 - 15.0 (g/dL)    HCT 52.8  41.3 -  24.4 (%)    MCV 89.7  78.0 - 100.0 (fL)    MCH 28.9  26.0 - 34.0 (pg)    MCHC 32.2  30.0 - 36.0 (g/dL)    RDW 01.0  27.2 - 53.6 (%)    Platelets 139 (*) 150 - 400 (K/uL)   PROTIME-INR     Status: Abnormal   Collection Time   09/21/11  4:21 AM      Component Value Range Comment   Prothrombin Time 15.3 (*) 11.6 - 15.2 (seconds)    INR 1.18  0.00 - 1.49    GLUCOSE, CAPILLARY     Status: Abnormal   Collection Time   09/21/11  7:55 AM      Component Value Range Comment   Glucose-Capillary 146 (*) 70 - 99 (mg/dL)     Imaging: No results found.  Assessment:  1. Active Problems: 2.  * No active hospital problems. *  3. PAD s/p PCI to the right SFA with a nitinol self-expanding stent  Plan:  1. Doing well s/p PCI to the right SFA. Groin site intact without hematoma or bruit. No ecchymosis. She is not having leg pain with ambulation. Confluent pink macular rash over the chest which is pruritic. Could be drug rash, but unclear what the offending agent is. She did not get an antiplatelet. I recommended prn benadryl and topical hydrocortisone cream. Ok for discharge today.  Time Spent Directly with Patient:  15 minutes  Length of Stay:  LOS: 1 day   Chrystie Nose, MD, [facility] Attending Cardiologist [facility]  Rorik Vespa C 09/21/2011, 9:05 AM

## 2011-09-28 DIAGNOSIS — I359 Nonrheumatic aortic valve disorder, unspecified: Secondary | ICD-10-CM | POA: Diagnosis not present

## 2011-09-28 DIAGNOSIS — Z7901 Long term (current) use of anticoagulants: Secondary | ICD-10-CM | POA: Diagnosis not present

## 2011-09-29 ENCOUNTER — Ambulatory Visit
Admission: RE | Admit: 2011-09-29 | Discharge: 2011-09-29 | Disposition: A | Discharge status: Home / Self Care | Payer: Medicare Other | Source: Ambulatory Visit | Attending: Otolaryngology | Admitting: Otolaryngology

## 2011-09-29 ENCOUNTER — Other Ambulatory Visit: Payer: Self-pay | Admitting: Otolaryngology

## 2011-09-29 DIAGNOSIS — R591 Generalized enlarged lymph nodes: Secondary | ICD-10-CM

## 2011-10-04 ENCOUNTER — Telehealth: Payer: Self-pay | Admitting: Internal Medicine

## 2011-10-04 ENCOUNTER — Ambulatory Visit
Admission: RE | Admit: 2011-10-04 | Discharge: 2011-10-04 | Disposition: A | Discharge status: Home / Self Care | Payer: Medicare Other | Source: Ambulatory Visit | Attending: Otolaryngology | Admitting: Otolaryngology

## 2011-10-04 DIAGNOSIS — R599 Enlarged lymph nodes, unspecified: Secondary | ICD-10-CM | POA: Diagnosis not present

## 2011-10-04 DIAGNOSIS — R591 Generalized enlarged lymph nodes: Secondary | ICD-10-CM

## 2011-10-04 MED ORDER — IOHEXOL 300 MG/ML  SOLN
75.0000 mL | Freq: Once | INTRAMUSCULAR | Status: AC | PRN
  Administered 2011-10-04: 75 mL via INTRAVENOUS

## 2011-10-04 NOTE — Telephone Encounter (Signed)
reveiwed records: had nodule seen in February '13 - no change in 3 months. Neck nodes, reason for test, are much better. Left msg on her cell phone

## 2011-10-04 NOTE — Telephone Encounter (Signed)
Received copies from [facility] ,on 10/04/11 . Forwarded 2 pages to Dr. Debby Bud ,for review.

## 2011-10-05 ENCOUNTER — Other Ambulatory Visit: Payer: Self-pay | Admitting: Otolaryngology

## 2011-10-05 DIAGNOSIS — R599 Enlarged lymph nodes, unspecified: Secondary | ICD-10-CM

## 2011-10-06 DIAGNOSIS — I10 Essential (primary) hypertension: Secondary | ICD-10-CM | POA: Diagnosis not present

## 2011-10-06 DIAGNOSIS — Z7901 Long term (current) use of anticoagulants: Secondary | ICD-10-CM | POA: Diagnosis not present

## 2011-10-06 DIAGNOSIS — E119 Type 2 diabetes mellitus without complications: Secondary | ICD-10-CM | POA: Diagnosis not present

## 2011-10-06 DIAGNOSIS — I739 Peripheral vascular disease, unspecified: Secondary | ICD-10-CM | POA: Diagnosis not present

## 2011-10-13 ENCOUNTER — Ambulatory Visit
Admission: RE | Admit: 2011-10-13 | Discharge: 2011-10-13 | Disposition: A | Discharge status: Home / Self Care | Payer: Medicare Other | Source: Ambulatory Visit | Attending: Otolaryngology | Admitting: Otolaryngology

## 2011-10-13 DIAGNOSIS — R918 Other nonspecific abnormal finding of lung field: Secondary | ICD-10-CM | POA: Diagnosis not present

## 2011-10-13 DIAGNOSIS — R911 Solitary pulmonary nodule: Secondary | ICD-10-CM

## 2011-10-13 DIAGNOSIS — R599 Enlarged lymph nodes, unspecified: Secondary | ICD-10-CM

## 2011-10-13 DIAGNOSIS — J984 Other disorders of lung: Secondary | ICD-10-CM | POA: Diagnosis not present

## 2011-10-14 DIAGNOSIS — Z7901 Long term (current) use of anticoagulants: Secondary | ICD-10-CM | POA: Diagnosis not present

## 2011-10-14 DIAGNOSIS — Z79899 Other long term (current) drug therapy: Secondary | ICD-10-CM | POA: Diagnosis not present

## 2011-10-14 DIAGNOSIS — I70219 Atherosclerosis of native arteries of extremities with intermittent claudication, unspecified extremity: Secondary | ICD-10-CM | POA: Diagnosis not present

## 2011-10-14 DIAGNOSIS — I739 Peripheral vascular disease, unspecified: Secondary | ICD-10-CM | POA: Diagnosis not present

## 2011-10-15 ENCOUNTER — Other Ambulatory Visit: Payer: Medicare Other

## 2011-10-19 ENCOUNTER — Encounter: Payer: Self-pay | Admitting: Internal Medicine

## 2011-10-19 ENCOUNTER — Telehealth: Payer: Self-pay | Admitting: Internal Medicine

## 2011-10-19 ENCOUNTER — Ambulatory Visit (INDEPENDENT_AMBULATORY_CARE_PROVIDER_SITE_OTHER): Payer: Medicare Other | Admitting: Internal Medicine

## 2011-10-19 VITALS — BP 132/58 | HR 112 | Temp 100.1°F | Ht 62.0 in | Wt 197.5 lb

## 2011-10-19 DIAGNOSIS — J209 Acute bronchitis, unspecified: Secondary | ICD-10-CM | POA: Diagnosis not present

## 2011-10-19 DIAGNOSIS — R062 Wheezing: Secondary | ICD-10-CM

## 2011-10-19 DIAGNOSIS — E119 Type 2 diabetes mellitus without complications: Secondary | ICD-10-CM

## 2011-10-19 MED ORDER — METHYLPREDNISOLONE ACETATE 80 MG/ML IJ SUSP
120.0000 mg | Freq: Once | INTRAMUSCULAR | Status: AC
  Administered 2011-10-19: 120 mg via INTRAMUSCULAR

## 2011-10-19 MED ORDER — HYDROCODONE-HOMATROPINE 5-1.5 MG/5ML PO SYRP: 5.0000 mL | ORAL_SOLUTION | Freq: Four times a day (QID) | ORAL | Status: AC | PRN

## 2011-10-19 MED ORDER — LEVOFLOXACIN 250 MG PO TABS: 250.0000 mg | ORAL_TABLET | Freq: Every day | ORAL | Status: AC

## 2011-10-19 MED ORDER — PREDNISONE 10 MG PO TABS: 10.0000 mg | ORAL_TABLET | Freq: Every day | ORAL | Status: DC

## 2011-10-19 MED ORDER — GLUCOSE BLOOD VI STRP: ORAL_STRIP | Status: DC

## 2011-10-19 NOTE — Telephone Encounter (Signed)
Caller: [place]/Patient; PCP: Illene Regulus; CB#: [phone]; Call regarding Cough/Congestion;  Onset cough 10/17/11.  Reports fever 101.5 on 5/26, but none since.  Nonproductive cough, runny nose.   Appointment  with Dr. Jonny Ruiz at 15:30 per Cough protocol.

## 2011-10-19 NOTE — Progress Notes (Signed)
Subjective:    Patient ID: Abigail Oliver, female    DOB: Sep 15, 1945, 66 y.o.   MRN: [id number]  HPI  Here with acute onset mild to mod 3-4 days fever, ST, HA, general weakness and malaise, with prod cough greenish sputum, but Pt denies chest pain, increased sob or doe, wheezing, orthopnea, PND, increased LE swelling, palpitations, dizziness or syncope, except for onset wheezing 1-2 days ago as well.  Pt denies new neurological symptoms such as new headache, or facial or extremity weakness or numbness   Pt denies polydipsia, polyuria.   Past Medical History  Diagnosis Date  . Coronary atherosclerosis of native coronary artery   . Hyperlipidemia   . Chronic angle-closure glaucoma   . Aortic valve disorders   . Peripheral vascular disease   . Type II diabetes mellitus   . Depression    Past Surgical History  Procedure Date  . Carpal tunnel release 1989    bilaterally  . Cholecystectomy 1990  . Peripheral arterial stent graft 09/20/11    right SFA  . Cardiac valve replacement 2000    aortic valve   . Laceration repair     right hand  . Neuroplasty / transposition ulnar nerve at elbow     right  . Tonsillectomy 1968  . Vaginal hysterectomy 1985    Fibroids  . Bilateral oophorectomy 1987  . Cesarean section 1969; 1971  . Breast biopsy 2012    left  . Lymph node biopsy 06/2011    "core needle on 5"  . Cataract extraction w/ intraocular lens  implant, bilateral ~ 2007  . Refractive surgery ` 2004    "for glaucoma"  . Needle biopsy 2009    "on ankles for nerve damage"    reports that she has never smoked. She has never used smokeless tobacco. She reports that she does not drink alcohol or use illicit drugs. family history includes COPD in her mother; Cancer in her mother; and Emphysema in her mother.  There is no history of Diabetes. Allergies  Allergen Reactions  . Iodinated Diagnostic Agents Rash     Red rash after cardiac cath 1 wk ago, ? Contrast allergy, requires 13 hr  prep now per [email or web address]   Current Outpatient Prescriptions on File Prior to Visit  Medication Sig Dispense Refill  . aspirin EC 81 MG tablet Take 81 mg by mouth daily.      . brimonidine (ALPHAGAN P) 0.1 % SOLN Place 1 drop into both eyes 2 (two) times daily.        . calcium-vitamin D (CALCIUM 500+D) 500-200 MG-UNIT per tablet Take 1 tablet by mouth daily.      . citalopram (CELEXA) 20 MG tablet Take 1 tablet (20 mg total) by mouth daily.  30 tablet  6  . cyclobenzaprine (FLEXERIL) 10 MG tablet Take 10 mg by mouth 2 (two) times daily as needed. For muscle spasms      . folic acid (FOLVITE) 1 MG tablet Take 1 mg by mouth daily.       Marland Kitchen latanoprost (XALATAN) 0.005 % ophthalmic solution Place 1 drop into both eyes at bedtime.        . metFORMIN (GLUCOPHAGE) 500 MG tablet Take 1 tablet (500 mg total) by mouth 2 (two) times daily with a meal.      . metoprolol (TOPROL-XL) 50 MG 24 hr tablet Take 1 tablet (50 mg total) by mouth daily.  30 tablet  6  . potassium chloride (K-DUR,KLOR-CON) 10 MEQ  tablet Take 10 mEq by mouth daily.      . rosuvastatin (CRESTOR) 10 MG tablet Take 10 mg by mouth at bedtime.      . torsemide (DEMADEX) 20 MG tablet Take 20 mg by mouth daily.      Marland Kitchen warfarin (COUMADIN) 3 MG tablet Take 3 mg by mouth daily.      Marland Kitchen zolpidem (AMBIEN) 10 MG tablet Take 10 mg by mouth at bedtime as needed. For sleep      . DISCONTD: glucose blood (ONE TOUCH ULTRA TEST) test strip Use as instructed once daily to test blood glucose. Dx: 250.0  100 each  12  . acetaminophen (TYLENOL) 325 MG tablet Take 2 tablets (650 mg total) by mouth every 4 (four) hours as needed.      . Ascorbic Acid (VITAMIN C) 500 MG tablet Take 500 mg by mouth daily.        . diphenhydrAMINE (BENADRYL) 25 mg capsule Take 1 capsule (25 mg total) by mouth every 6 (six) hours as needed for itching.  30 capsule    . enoxaparin (LOVENOX) 80 MG/0.8ML SOLN injection Inject 80 mg into the skin every 12 (twelve) hours.        . hydrocortisone cream 1 % Apply topically 4 (four) times daily as needed.  30 g    . Lancets (ONETOUCH ULTRASOFT) lancets Use as instructed once daily to test blood glucose. Dx: 250.0  100 each  12  . traMADol (ULTRAM) 50 MG tablet Take 50 mg by mouth every 8 (eight) hours as needed. For pain       No current facility-administered medications on file prior to visit.   Review of Systems Review of Systems  Constitutional: Negative for diaphoresis and unexpected weight change.  HENT: Negative for drooling and tinnitus.   Eyes: Negative for photophobia and visual disturbance.  Respiratory: Negative for choking and stridor.   Genitourinary: Negative for hematuria and decreased urine volume.  Musculoskeletal: Negative for gait problem.  Skin: Negative for color change  Neurological: Negative for tremors and numbness.  Psychiatric/Behavioral: Negative for decreased concentration. The patient is not hyperactive.      Objective:   Physical Exam BP 132/58  Pulse 112  Temp(Src) 100.1 F (37.8 C) (Oral)  Ht 5\' 2"  (1.575 m)  Wt 197 lb 8 oz (89.585 kg)  BMI 36.12 kg/m2  SpO2 92% Physical Exam  VS noted, mild ill Constitutional: Pt appears well-developed and well-nourished.  HENT: Head: Normocephalic.  Right Ear: External ear normal.  Left Ear: External ear normal.  Bilat tm's mild erythema.  Sinus nontender.  Pharynx mild erythema Eyes: Conjunctivae and EOM are normal. Pupils are equal, round, and reactive to light.  Neck: Normal range of motion. Neck supple.  Cardiovascular: Normal rate and regular rhythm.   Pulmonary/Chest: Effort normal and breath sounds mild decreased bilat, with mild wheeze bilat.  Neurological: Pt is alert. Not confused Skin: Skin is warm. No erythema. No rash Psychiatric: Pt behavior is normal. Thought content normal. 1+ nervous    Assessment & Plan:

## 2011-10-19 NOTE — Assessment & Plan Note (Signed)
stable overall by hx and exam, most recent data reviewed with pt, and pt to continue medical treatment as before, to monitor for onset polys or cbg > 200 on steroid tx Lab Results  Component Value Date   HGBA1C 6.3 04/26/2011

## 2011-10-19 NOTE — Patient Instructions (Signed)
You had the steroid shot today Take all new medications as prescribed Continue all other medications as before Remember, the steroid can make your blood sugar increase somewhat, but should return to "baseline" when you are off the prednisone

## 2011-10-19 NOTE — Assessment & Plan Note (Signed)
Mild to mod, for depomedrol IM and predpack asd,  to f/u any worsening symptoms or concerns 

## 2011-10-19 NOTE — Assessment & Plan Note (Signed)
Mild to mod, for antibx course,  to f/u any worsening symptoms or concerns 

## 2011-10-29 ENCOUNTER — Telehealth: Payer: Self-pay | Admitting: *Deleted

## 2011-10-29 NOTE — Telephone Encounter (Signed)
[facility] and patient notified of approval for medication cyclobenzaprine hcl

## 2011-11-01 DIAGNOSIS — Z7901 Long term (current) use of anticoagulants: Secondary | ICD-10-CM | POA: Diagnosis not present

## 2011-11-01 DIAGNOSIS — I359 Nonrheumatic aortic valve disorder, unspecified: Secondary | ICD-10-CM | POA: Diagnosis not present

## 2011-11-15 ENCOUNTER — Other Ambulatory Visit: Payer: Self-pay | Admitting: *Deleted

## 2011-11-15 MED ORDER — METFORMIN HCL 500 MG PO TABS: 500.0000 mg | ORAL_TABLET | Freq: Two times a day (BID) | ORAL | Status: DC

## 2011-12-14 DIAGNOSIS — I4891 Unspecified atrial fibrillation: Secondary | ICD-10-CM | POA: Diagnosis not present

## 2011-12-14 DIAGNOSIS — Z7901 Long term (current) use of anticoagulants: Secondary | ICD-10-CM | POA: Diagnosis not present

## 2011-12-16 ENCOUNTER — Telehealth: Payer: Self-pay | Admitting: *Deleted

## 2011-12-16 NOTE — Telephone Encounter (Signed)
Patient request refill on cyclobenzaprine. Last OV  06/2011

## 2011-12-16 NOTE — Telephone Encounter (Signed)
K x 3

## 2011-12-17 MED ORDER — CYCLOBENZAPRINE HCL 10 MG PO TABS: 10.0000 mg | ORAL_TABLET | Freq: Two times a day (BID) | ORAL | Status: DC | PRN

## 2011-12-17 NOTE — Telephone Encounter (Signed)
Refill to [facility].

## 2012-01-03 DIAGNOSIS — H409 Unspecified glaucoma: Secondary | ICD-10-CM | POA: Diagnosis not present

## 2012-01-03 DIAGNOSIS — H4011X0 Primary open-angle glaucoma, stage unspecified: Secondary | ICD-10-CM | POA: Diagnosis not present

## 2012-02-07 ENCOUNTER — Encounter: Payer: Self-pay | Admitting: Endocrinology

## 2012-02-07 ENCOUNTER — Ambulatory Visit (INDEPENDENT_AMBULATORY_CARE_PROVIDER_SITE_OTHER)
Admission: RE | Admit: 2012-02-07 | Discharge: 2012-02-07 | Disposition: A | Discharge status: Home / Self Care | Payer: Medicare Other | Source: Ambulatory Visit | Attending: Endocrinology | Admitting: Endocrinology

## 2012-02-07 ENCOUNTER — Ambulatory Visit (INDEPENDENT_AMBULATORY_CARE_PROVIDER_SITE_OTHER): Payer: Medicare Other | Admitting: Endocrinology

## 2012-02-07 ENCOUNTER — Other Ambulatory Visit (INDEPENDENT_AMBULATORY_CARE_PROVIDER_SITE_OTHER): Payer: Medicare Other

## 2012-02-07 ENCOUNTER — Telehealth: Payer: Self-pay | Admitting: Internal Medicine

## 2012-02-07 VITALS — BP 132/78 | HR 96 | Temp 99.9°F

## 2012-02-07 DIAGNOSIS — R05 Cough: Secondary | ICD-10-CM | POA: Diagnosis not present

## 2012-02-07 DIAGNOSIS — E119 Type 2 diabetes mellitus without complications: Secondary | ICD-10-CM | POA: Diagnosis not present

## 2012-02-07 DIAGNOSIS — F331 Major depressive disorder, recurrent, moderate: Secondary | ICD-10-CM

## 2012-02-07 DIAGNOSIS — S40019A Contusion of unspecified shoulder, initial encounter: Secondary | ICD-10-CM

## 2012-02-07 DIAGNOSIS — M25519 Pain in unspecified shoulder: Secondary | ICD-10-CM | POA: Diagnosis not present

## 2012-02-07 DIAGNOSIS — R059 Cough, unspecified: Secondary | ICD-10-CM

## 2012-02-07 DIAGNOSIS — R55 Syncope and collapse: Secondary | ICD-10-CM | POA: Diagnosis not present

## 2012-02-07 DIAGNOSIS — S40011A Contusion of right shoulder, initial encounter: Secondary | ICD-10-CM

## 2012-02-07 DIAGNOSIS — Z043 Encounter for examination and observation following other accident: Secondary | ICD-10-CM | POA: Diagnosis not present

## 2012-02-07 DIAGNOSIS — R079 Chest pain, unspecified: Secondary | ICD-10-CM | POA: Diagnosis not present

## 2012-02-07 DIAGNOSIS — R0602 Shortness of breath: Secondary | ICD-10-CM | POA: Diagnosis not present

## 2012-02-07 LAB — CBC WITH DIFFERENTIAL/PLATELET
Basophils Absolute: 0 10*3/uL (ref 0.0–0.1)
Basophils Relative: 0.5 % (ref 0.0–3.0)
Eosinophils Absolute: 0 10*3/uL (ref 0.0–0.7)
Eosinophils Relative: 0.7 % (ref 0.0–5.0)
HCT: 37.5 % (ref 36.0–46.0)
Hemoglobin: 12.4 g/dL (ref 12.0–15.0)
Lymphocytes Relative: 27.7 % (ref 12.0–46.0)
Lymphs Abs: 1.5 10*3/uL (ref 0.7–4.0)
MCHC: 33.2 g/dL (ref 30.0–36.0)
MCV: 87.3 fl (ref 78.0–100.0)
Monocytes Absolute: 0.9 10*3/uL (ref 0.1–1.0)
Monocytes Relative: 15.7 % — ABNORMAL HIGH (ref 3.0–12.0)
Neutro Abs: 3 10*3/uL (ref 1.4–7.7)
Neutrophils Relative %: 55.4 % (ref 43.0–77.0)
Platelets: 143 10*3/uL — ABNORMAL LOW (ref 150.0–400.0)
RBC: 4.29 Mil/uL (ref 3.87–5.11)
RDW: 15.7 % — ABNORMAL HIGH (ref 11.5–14.6)
WBC: 5.4 10*3/uL (ref 4.5–10.5)

## 2012-02-07 LAB — HEMOGLOBIN A1C: Hgb A1c MFr Bld: 6.2 % (ref 4.6–6.5)

## 2012-02-07 NOTE — Telephone Encounter (Signed)
Caller: [name]/Patient; Patient Name: Abigail Oliver; PCP: Illene Regulus (Adults only); Best Callback Phone Number: [phone]. Call regarding right shoulder pain. Onset 01/24/12. Reports she cannot raise the arm above her head. Patient is confused at to her CT scan results of lumps on her lung. She would like clarification regarding the number of lumps present and if she needs to do anything. Afebrile. Feels tingling in the arm when she tries to raise the arm above her head. Bruising to the upper arm and can feel knots under the skin which are painful. Emergent symptom of "Extremity swelling or limitation of range of motion and known bleeding disorder or taking blood thinner, chemotherapy or transplant medication" positive per Shoulder Injury guideline. Patient currently takes Coumadin. Disposition: See ED Immediately. Triager feels the patient is safe to come in for an appointment this afternoon. Appointment scheduled with Dr. Everardo All 02/07/12 at 3:45pm. Instructed patient to call back if any symptoms worsen before appointment.

## 2012-02-07 NOTE — Patient Instructions (Addendum)
Let's check a "holter" (24-hr heartbeat recording). blood tests, and an x-ray, are being requested for you today.  You will receive a letter with results. Please make an appointment to see dr Debby Bud.  Please bring the paper with the list of your symptoms.   Also, i am requesting for you an appointment to see dr Tresa Endo sooner.  you will receive a phone call, about a day and time for an appointment

## 2012-02-07 NOTE — Progress Notes (Signed)
Subjective:    Patient ID: Abigail Oliver, female    DOB: Jul 05, 1945, 66 y.o.   MRN: [id number]  HPI Yesterday, pt was at home.  She turned and fell, striking her right shoulder.  She has moderate pain there since then.  No assoc LOC.  She has similar episodes of dizziness.  She says she did a holter a few years ago, for these sxs. Past Medical History  Diagnosis Date  . Coronary atherosclerosis of native coronary artery   . Hyperlipidemia   . Chronic angle-closure glaucoma   . Aortic valve disorders   . Peripheral vascular disease   . Type II diabetes mellitus   . Depression     Past Surgical History  Procedure Date  . Carpal tunnel release 1989    bilaterally  . Cholecystectomy 1990  . Peripheral arterial stent graft 09/20/11    right SFA  . Cardiac valve replacement 2000    aortic valve   . Laceration repair     right hand  . Neuroplasty / transposition ulnar nerve at elbow     right  . Tonsillectomy 1968  . Vaginal hysterectomy 1985    Fibroids  . Bilateral oophorectomy 1987  . Cesarean section 1969; 1971  . Breast biopsy 2012    left  . Lymph node biopsy 06/2011    "core needle on 5"  . Cataract extraction w/ intraocular lens  implant, bilateral ~ 2007  . Refractive surgery ` 2004    "for glaucoma"  . Needle biopsy 2009    "on ankles for nerve damage"    History   Social History  . Marital Status: Married    Spouse Name: N/A    Number of Children: N/A  . Years of Education: N/A   Occupational History  . Not on file.   Social History Main Topics  . Smoking status: Never Smoker   . Smokeless tobacco: Never Used  . Alcohol Use: No  . Drug Use: No  . Sexually Active: No   Other Topics Concern  . Not on file   Social History Narrative   Pt is a high school graduate with 2 years of college. Married in 1967 she has 1 son born 50 and 1 daughter born 44 and 1 grandchild. 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    Current  Outpatient Prescriptions on File Prior to Visit  Medication Sig Dispense Refill  . Ascorbic Acid (VITAMIN C) 500 MG tablet Take 500 mg by mouth daily.        Marland Kitchen aspirin EC 81 MG tablet Take 81 mg by mouth daily.      . calcium-vitamin D (CALCIUM 500+D) 500-200 MG-UNIT per tablet Take 1 tablet by mouth daily.      Marland Kitchen enoxaparin (LOVENOX) 80 MG/0.8ML SOLN injection Inject 80 mg into the skin every 12 (twelve) hours.       . folic acid (FOLVITE) 1 MG tablet Take 1 mg by mouth daily.       Marland Kitchen glucose blood (ONE TOUCH ULTRA TEST) test strip Use as instructed once daily to test blood glucose. Dx: 250.0  100 each  12  . hydrocortisone cream 1 % Apply topically 4 (four) times daily as needed.  30 g    . Lancets (ONETOUCH ULTRASOFT) lancets Use as instructed once daily to test blood glucose. Dx: 250.0  100 each  12  . latanoprost (XALATAN) 0.005 % ophthalmic solution Place 1 drop into  both eyes at bedtime.        . metoprolol (TOPROL-XL) 50 MG 24 hr tablet Take 1 tablet (50 mg total) by mouth daily.  30 tablet  6  . potassium chloride (K-DUR,KLOR-CON) 10 MEQ tablet Take 10 mEq by mouth daily.      . rosuvastatin (CRESTOR) 10 MG tablet Take 10 mg by mouth at bedtime.      . torsemide (DEMADEX) 20 MG tablet Take 20 mg by mouth daily.      . traMADol (ULTRAM) 50 MG tablet Take 50 mg by mouth every 8 (eight) hours as needed. For pain      . warfarin (COUMADIN) 3 MG tablet Take 3 mg by mouth daily.      . citalopram (CELEXA) 20 MG tablet Take 1 tablet (20 mg total) by mouth daily.  30 tablet  6  . cyclobenzaprine (FLEXERIL) 10 MG tablet Take 1 tablet (10 mg total) by mouth 2 (two) times daily as needed. For muscle spasms  60 tablet  3  . zolpidem (AMBIEN) 10 MG tablet Take 1 tablet (10 mg total) by mouth at bedtime as needed. For sleep  30 tablet  5    Allergies  Allergen Reactions  . Iodinated Diagnostic Agents Rash     Red rash after cardiac cath 1 wk ago, ? Contrast allergy, requires 13 hr prep now per  [email or web address]    Family History  Problem Relation Age of Onset  . Cancer Mother     Colon  . COPD Mother   . Emphysema Mother   . Diabetes Neg Hx     BP 132/78  Pulse 96  Temp 99.9 F (37.7 C) (Oral)  SpO2 95%    Review of Systems Denies chest pain.  She has back pain and headache, only with cough.  She also had intermittent fever over the past few days, better today.  She has a paper with many other symptoms.     Objective:   Physical Exam VS: see vs page GEN: no distress HEAD: head: no deformity eyes: no periorbital swelling, no proptosis external nose and ears are normal mouth: no lesion seen NECK: supple, thyroid is not enlarged CHEST WALL: no deformity.  Old healed (median sternotomy) surgical scar LUNGS:  Clear to auscultation CV: reg rate and rhythm, no murmur, but she has a valve click.   MUSCULOSKELETAL: right shoulder rom is limited by pain.  gait is normal and steady EXTEMITIES: no deformity.  no edema PULSES: dorsalis pedis intact bilat.  no carotid bruit NEURO:  cn 2-12 grossly intact.   readily moves all 4's.  sensation is intact to touch on the feet SKIN:  Normal texture and temperature.  No rash or suspicious lesion is visible.   NODES:  None palpable at the neck PSYCH: alert, oriented x3.  Does not appear anxious nor depressed.  Lab Results  Component Value Date   WBC 5.4 02/07/2012   HGB 12.4 02/07/2012   HCT 37.5 02/07/2012   PLT 143.0* 02/07/2012   GLUCOSE 139* 02/07/2012   CHOL 192 04/26/2011   TRIG 186.0* 04/26/2011   HDL 31.30* 04/26/2011   LDLCALC 124* 04/26/2011   ALT 43* 06/30/2011   AST 56* 06/30/2011   NA 130* 02/07/2012   K 4.1 02/07/2012   CL 93* 02/07/2012   CREATININE 0.9 02/07/2012   BUN 17 02/07/2012   CO2 26 02/07/2012   TSH 1.93 02/07/2012   INR 1.18 09/21/2011   HGBA1C 6.2 02/07/2012  i reviewed electrocardiogram, and x-ray results    Assessment & Plan:  Hear-syncope, recurrent Shoulder contusion, no fx Hyponatremia,  mild, new

## 2012-02-08 LAB — BASIC METABOLIC PANEL WITH GFR
BUN: 17 mg/dL (ref 6–23)
CO2: 26 meq/L (ref 19–32)
Calcium: 9.7 mg/dL (ref 8.4–10.5)
Chloride: 93 meq/L — ABNORMAL LOW (ref 96–112)
Creatinine, Ser: 0.9 mg/dL (ref 0.4–1.2)
GFR: 67.36 mL/min
Glucose, Bld: 139 mg/dL — ABNORMAL HIGH (ref 70–99)
Potassium: 4.1 meq/L (ref 3.5–5.1)
Sodium: 130 meq/L — ABNORMAL LOW (ref 135–145)

## 2012-02-08 LAB — TSH: TSH: 1.93 u[IU]/mL (ref 0.35–5.50)

## 2012-02-09 DIAGNOSIS — Z7901 Long term (current) use of anticoagulants: Secondary | ICD-10-CM | POA: Diagnosis not present

## 2012-02-09 DIAGNOSIS — R42 Dizziness and giddiness: Secondary | ICD-10-CM

## 2012-02-09 DIAGNOSIS — R55 Syncope and collapse: Secondary | ICD-10-CM | POA: Diagnosis not present

## 2012-02-09 DIAGNOSIS — I4891 Unspecified atrial fibrillation: Secondary | ICD-10-CM | POA: Diagnosis not present

## 2012-02-09 HISTORY — DX: Dizziness and giddiness: R42

## 2012-02-10 ENCOUNTER — Other Ambulatory Visit: Payer: Self-pay | Admitting: *Deleted

## 2012-02-10 MED ORDER — METFORMIN HCL 500 MG PO TABS: 500.0000 mg | ORAL_TABLET | Freq: Two times a day (BID) | ORAL | Status: DC

## 2012-02-10 MED ORDER — ZOLPIDEM TARTRATE 10 MG PO TABS: 10.0000 mg | ORAL_TABLET | Freq: Every evening | ORAL | Status: DC | PRN

## 2012-02-10 MED ORDER — CITALOPRAM HYDROBROMIDE 20 MG PO TABS: 20.0000 mg | ORAL_TABLET | Freq: Every day | ORAL | Status: DC

## 2012-02-15 DIAGNOSIS — E119 Type 2 diabetes mellitus without complications: Secondary | ICD-10-CM | POA: Diagnosis not present

## 2012-02-15 DIAGNOSIS — Z7901 Long term (current) use of anticoagulants: Secondary | ICD-10-CM | POA: Diagnosis not present

## 2012-02-15 DIAGNOSIS — R55 Syncope and collapse: Secondary | ICD-10-CM | POA: Diagnosis not present

## 2012-02-15 DIAGNOSIS — Z954 Presence of other heart-valve replacement: Secondary | ICD-10-CM | POA: Diagnosis not present

## 2012-03-09 ENCOUNTER — Other Ambulatory Visit: Payer: Self-pay | Admitting: *Deleted

## 2012-03-09 MED ORDER — METOPROLOL SUCCINATE ER 50 MG PO TB24: 50.0000 mg | ORAL_TABLET | Freq: Every day | ORAL | Status: DC

## 2012-03-27 DIAGNOSIS — R0609 Other forms of dyspnea: Secondary | ICD-10-CM | POA: Diagnosis not present

## 2012-03-27 DIAGNOSIS — R002 Palpitations: Secondary | ICD-10-CM | POA: Diagnosis not present

## 2012-03-27 DIAGNOSIS — R5381 Other malaise: Secondary | ICD-10-CM | POA: Diagnosis not present

## 2012-03-27 DIAGNOSIS — R5383 Other fatigue: Secondary | ICD-10-CM | POA: Diagnosis not present

## 2012-03-27 DIAGNOSIS — Z7901 Long term (current) use of anticoagulants: Secondary | ICD-10-CM | POA: Diagnosis not present

## 2012-03-27 DIAGNOSIS — R0989 Other specified symptoms and signs involving the circulatory and respiratory systems: Secondary | ICD-10-CM | POA: Diagnosis not present

## 2012-03-27 DIAGNOSIS — I4891 Unspecified atrial fibrillation: Secondary | ICD-10-CM | POA: Diagnosis not present

## 2012-03-28 ENCOUNTER — Other Ambulatory Visit: Payer: Self-pay | Admitting: *Deleted

## 2012-03-28 MED ORDER — POTASSIUM CHLORIDE CRYS ER 10 MEQ PO TBCR: 10.0000 meq | EXTENDED_RELEASE_TABLET | Freq: Two times a day (BID) | ORAL | Status: DC

## 2012-03-29 DIAGNOSIS — R55 Syncope and collapse: Secondary | ICD-10-CM

## 2012-03-29 DIAGNOSIS — I2510 Atherosclerotic heart disease of native coronary artery without angina pectoris: Secondary | ICD-10-CM | POA: Diagnosis not present

## 2012-03-29 DIAGNOSIS — R0609 Other forms of dyspnea: Secondary | ICD-10-CM | POA: Diagnosis not present

## 2012-03-29 HISTORY — DX: Syncope and collapse: R55

## 2012-03-29 HISTORY — PX: DOPPLER ECHOCARDIOGRAPHY: SHX263

## 2012-04-06 ENCOUNTER — Other Ambulatory Visit (HOSPITAL_COMMUNITY): Payer: Self-pay | Admitting: Cardiovascular Disease

## 2012-04-06 DIAGNOSIS — I739 Peripheral vascular disease, unspecified: Secondary | ICD-10-CM

## 2012-04-17 ENCOUNTER — Inpatient Hospital Stay (HOSPITAL_COMMUNITY): Admission: RE | Admit: 2012-04-17 | Payer: Medicare Other | Source: Ambulatory Visit

## 2012-05-01 ENCOUNTER — Encounter (HOSPITAL_COMMUNITY): Payer: Medicare Other

## 2012-05-25 DIAGNOSIS — I359 Nonrheumatic aortic valve disorder, unspecified: Secondary | ICD-10-CM | POA: Diagnosis not present

## 2012-05-25 DIAGNOSIS — Z7901 Long term (current) use of anticoagulants: Secondary | ICD-10-CM | POA: Diagnosis not present

## 2012-05-30 ENCOUNTER — Ambulatory Visit (HOSPITAL_COMMUNITY)
Admission: RE | Admit: 2012-05-30 | Discharge: 2012-05-30 | Disposition: A | Discharge status: Home / Self Care | Payer: Medicare Other | Source: Ambulatory Visit | Attending: Cardiovascular Disease | Admitting: Cardiovascular Disease

## 2012-05-30 DIAGNOSIS — I739 Peripheral vascular disease, unspecified: Secondary | ICD-10-CM | POA: Diagnosis not present

## 2012-05-30 DIAGNOSIS — Z7901 Long term (current) use of anticoagulants: Secondary | ICD-10-CM | POA: Diagnosis not present

## 2012-05-30 DIAGNOSIS — I359 Nonrheumatic aortic valve disorder, unspecified: Secondary | ICD-10-CM | POA: Diagnosis not present

## 2012-05-30 DIAGNOSIS — I70219 Atherosclerosis of native arteries of extremities with intermittent claudication, unspecified extremity: Secondary | ICD-10-CM | POA: Diagnosis not present

## 2012-05-30 NOTE — Progress Notes (Signed)
BLE arterial duplex completed. Abigail Oliver D  

## 2012-07-08 ENCOUNTER — Other Ambulatory Visit: Payer: Self-pay

## 2012-08-14 ENCOUNTER — Telehealth: Payer: Self-pay | Admitting: *Deleted

## 2012-08-14 MED ORDER — CYCLOBENZAPRINE HCL 10 MG PO TABS: 10.0000 mg | ORAL_TABLET | Freq: Two times a day (BID) | ORAL | Status: DC | PRN

## 2012-08-14 NOTE — Telephone Encounter (Signed)
Cyclobenzaprine request [last Rx 07.26.13 #60x3]/SLS Please advise.

## 2012-08-14 NOTE — Telephone Encounter (Signed)
Rx request to pharmacy/SLS  

## 2012-08-14 NOTE — Telephone Encounter (Signed)
Ok x 5 

## 2012-08-25 ENCOUNTER — Telehealth: Payer: Self-pay

## 2012-08-25 NOTE — Telephone Encounter (Signed)
Phone call to pt asking her to return call. Fax was received from [facility] on [address] requesting prior authorization for Cyclobenzaprine 10 mg. Per Dr Debby Bud, pt needs an office visit to establish diagnosis and renew medications.

## 2012-08-28 ENCOUNTER — Encounter: Payer: Self-pay | Admitting: Pharmacist Clinician (PhC)/ Clinical Pharmacy Specialist

## 2012-08-28 ENCOUNTER — Encounter (HOSPITAL_COMMUNITY): Payer: Medicare Other

## 2012-08-28 DIAGNOSIS — Z952 Presence of prosthetic heart valve: Secondary | ICD-10-CM

## 2012-08-28 DIAGNOSIS — Z7901 Long term (current) use of anticoagulants: Secondary | ICD-10-CM

## 2012-08-28 DIAGNOSIS — I359 Nonrheumatic aortic valve disorder, unspecified: Secondary | ICD-10-CM | POA: Diagnosis not present

## 2012-08-28 NOTE — Telephone Encounter (Signed)
Patient would like to schedule an appointment with another provider for a Monday later this month, she wants to know if this is OK and who Dr. Debby Bud recommends, please advise

## 2012-08-28 NOTE — Telephone Encounter (Signed)
Any of my colleagues who have an opening.

## 2012-08-29 NOTE — Telephone Encounter (Signed)
Left message for patent to call back and schedule

## 2012-08-31 NOTE — Telephone Encounter (Signed)
Left message for pt to call back and schedule

## 2012-09-11 ENCOUNTER — Encounter: Payer: Self-pay | Admitting: Internal Medicine

## 2012-09-11 ENCOUNTER — Encounter: Payer: Self-pay | Admitting: *Deleted

## 2012-09-11 ENCOUNTER — Ambulatory Visit (INDEPENDENT_AMBULATORY_CARE_PROVIDER_SITE_OTHER): Payer: Medicare Other | Admitting: Internal Medicine

## 2012-09-11 ENCOUNTER — Other Ambulatory Visit (INDEPENDENT_AMBULATORY_CARE_PROVIDER_SITE_OTHER): Payer: Medicare Other

## 2012-09-11 VITALS — BP 130/62 | HR 108 | Temp 98.5°F | Wt 202.0 lb

## 2012-09-11 DIAGNOSIS — M5126 Other intervertebral disc displacement, lumbar region: Secondary | ICD-10-CM

## 2012-09-11 DIAGNOSIS — E785 Hyperlipidemia, unspecified: Secondary | ICD-10-CM

## 2012-09-11 DIAGNOSIS — E119 Type 2 diabetes mellitus without complications: Secondary | ICD-10-CM

## 2012-09-11 DIAGNOSIS — R5381 Other malaise: Secondary | ICD-10-CM

## 2012-09-11 DIAGNOSIS — R5383 Other fatigue: Secondary | ICD-10-CM | POA: Diagnosis not present

## 2012-09-11 LAB — HEMOGLOBIN A1C: Hgb A1c MFr Bld: 7.2 % — ABNORMAL HIGH (ref 4.6–6.5)

## 2012-09-11 LAB — LIPID PANEL
Cholesterol: 137 mg/dL (ref 0–200)
HDL: 40.4 mg/dL (ref >39.00)
LDL Cholesterol: 74 mg/dL (ref 0–99)
Total CHOL/HDL Ratio: 3
Triglycerides: 114 mg/dL (ref 0.0–149.0)
VLDL: 22.8 mg/dL (ref 0.0–40.0)

## 2012-09-11 LAB — BASIC METABOLIC PANEL
BUN: 16 mg/dL (ref 6–23)
CO2: 27 mEq/L (ref 19–32)
Calcium: 10 mg/dL (ref 8.4–10.5)
Chloride: 99 mEq/L (ref 96–112)
Creatinine, Ser: 0.8 mg/dL (ref 0.4–1.2)
GFR: 79.47 mL/min (ref >60.00)
Glucose, Bld: 127 mg/dL — ABNORMAL HIGH (ref 70–99)
Potassium: 3.8 mEq/L (ref 3.5–5.1)
Sodium: 138 mEq/L (ref 135–145)

## 2012-09-11 LAB — CBC WITH DIFFERENTIAL/PLATELET
Basophils Absolute: 0.1 10*3/uL (ref 0.0–0.1)
Basophils Relative: 0.8 % (ref 0.0–3.0)
Eosinophils Absolute: 0.1 10*3/uL (ref 0.0–0.7)
Eosinophils Relative: 1.5 % (ref 0.0–5.0)
HCT: 37.2 % (ref 36.0–46.0)
Hemoglobin: 12.6 g/dL (ref 12.0–15.0)
Lymphocytes Relative: 34.5 % (ref 12.0–46.0)
Lymphs Abs: 2.4 10*3/uL (ref 0.7–4.0)
MCHC: 33.8 g/dL (ref 30.0–36.0)
MCV: 83.5 fl (ref 78.0–100.0)
Monocytes Absolute: 0.6 10*3/uL (ref 0.1–1.0)
Monocytes Relative: 8.1 % (ref 3.0–12.0)
Neutro Abs: 3.9 10*3/uL (ref 1.4–7.7)
Neutrophils Relative %: 55.1 % (ref 43.0–77.0)
Platelets: 190 10*3/uL (ref 150.0–400.0)
RBC: 4.46 Mil/uL (ref 3.87–5.11)
RDW: 16.5 % — ABNORMAL HIGH (ref 11.5–14.6)
WBC: 7 10*3/uL (ref 4.5–10.5)

## 2012-09-11 LAB — TSH: TSH: 1.36 u[IU]/mL (ref 0.35–5.50)

## 2012-09-11 LAB — HEPATIC FUNCTION PANEL
ALT: 37 U/L — ABNORMAL HIGH (ref 0–35)
AST: 49 U/L — ABNORMAL HIGH (ref 0–37)
Albumin: 4.6 g/dL (ref 3.5–5.2)
Alkaline Phosphatase: 58 U/L (ref 39–117)
Bilirubin, Direct: 0.2 mg/dL (ref 0.0–0.3)
Total Bilirubin: 1.3 mg/dL — ABNORMAL HIGH (ref 0.3–1.2)
Total Protein: 8.2 g/dL (ref 6.0–8.3)

## 2012-09-11 MED ORDER — TIZANIDINE HCL 4 MG PO TABS: 4.0000 mg | ORAL_TABLET | Freq: Four times a day (QID) | ORAL | Status: DC | PRN

## 2012-09-11 MED ORDER — TRAMADOL HCL 50 MG PO TABS: 50.0000 mg | ORAL_TABLET | Freq: Three times a day (TID) | ORAL | Status: DC | PRN

## 2012-09-11 NOTE — Progress Notes (Signed)
  Subjective:    Patient ID: Abigail Oliver, female    DOB: 02-27-1946, 67 y.o.   MRN: [id number]  HPI  Here for followup, last seen by primary care physician >12 months ago in February 2013 Needs refills on medications as noted above Also requests referral to new endocrinologist Dr. Sharl Ma  Past Medical History  Diagnosis Date  . Coronary atherosclerosis of native coronary artery   . Hyperlipidemia   . Chronic angle-closure glaucoma(365.23)   . Aortic valve disorders   . Peripheral vascular disease   . Type II diabetes mellitus   . Depression     Review of Systems  HENT: Positive for rhinorrhea and postnasal drip.   Cardiovascular: Positive for palpitations. Negative for chest pain and leg swelling.  Musculoskeletal: Positive for back pain. Negative for joint swelling.  Psychiatric/Behavioral: Positive for dysphoric mood. Negative for behavioral problems and confusion. The patient is nervous/anxious.        Objective:   Physical Exam BP 130/62  Pulse 108  Temp(Src) 98.5 F (36.9 C) (Oral)  Wt 202 lb (91.627 kg)  BMI 36.94 kg/m2  SpO2 97% Wt Readings from Last 3 Encounters:  09/11/12 202 lb (91.627 kg)  10/19/11 197 lb 8 oz (89.585 kg)  09/21/11 198 lb 10.2 oz (90.1 kg)   Constitutional: She is obese, but appears well-developed and well-nourished. No distress. spouse at side  Neck: Normal range of motion. Neck supple. No JVD present. No thyromegaly present.  Cardiovascular: Normal rate, regular rhythm and normal heart sounds.  No murmur heard. No BLE edema. Pulmonary/Chest: Effort normal and breath sounds normal. No respiratory distress. She has no wheezes. Psychiatric: She has a normal mood and affect. Her behavior is normal. Judgment and thought content normal.   Lab Results  Component Value Date   WBC 5.4 02/07/2012   HGB 12.4 02/07/2012   HCT 37.5 02/07/2012   PLT 143.0* 02/07/2012   GLUCOSE 139* 02/07/2012   CHOL 192 04/26/2011   TRIG 186.0* 04/26/2011   HDL  31.30* 04/26/2011   LDLCALC 124* 04/26/2011   ALT 43* 06/30/2011   AST 56* 06/30/2011   NA 130* 02/07/2012   K 4.1 02/07/2012   CL 93* 02/07/2012   CREATININE 0.9 02/07/2012   BUN 17 02/07/2012   CO2 26 02/07/2012   TSH 1.93 02/07/2012   INR 1.18 09/21/2011   HGBA1C 6.2 02/07/2012       Assessment & Plan:   See problem list. Medications and labs reviewed today.  Fatigue - nonspecific symptoms/exam; occasional palpations -  ?cardiac versus anxiety - check screening labs and advised followup with cardiology as scheduled. If continued symptoms with normal labs and cardiac review, consider adjustment of antidepressant for control of anxiety and panic symptoms -followup PCP in 3 months to review same, sooner if problems

## 2012-09-11 NOTE — Assessment & Plan Note (Signed)
Chronic low back symptoms, okay to continue tramadol and Flexeril as ongoing No new symptoms, exam stable

## 2012-09-11 NOTE — Assessment & Plan Note (Signed)
On metformin therapy, previously well controlled but describes fasting sugars 200-400 in past few months Check A1c now and refer to endocrinology for new opinion as requested with Dr. Sharl Ma

## 2012-09-11 NOTE — Assessment & Plan Note (Signed)
On Crestor therapy for same given CAD/PAD Check lipids, titrate as needed Followup with cardiology as planned, [facility]- Dr. Tresa Endo

## 2012-09-11 NOTE — Patient Instructions (Signed)
It was good to see you today. We have reviewed your prior records including labs and tests today Refills as requested - use generic Zanaflex in place of Flexeril for muscle relaxer because of insurance formulary preference Your prescription(s) have been submitted to your pharmacy. Please take as directed and contact our office if you believe you are having problem(s) with the medication(s). Other medications reviewed and updated, no additional changes today Test(s) ordered today. Your results will be released to MyChart (or called to you) after review, usually within 72hours after test completion. If any changes need to be made, you will be notified at that same time. we'll make referral to Dr.Kerr for your diabetes as requested. Our office will contact you regarding appointment(s) once made. followup in 3 months with Dr. Alvera Novel to review depression, palpitations and other symptoms/medications; call sooner if problems

## 2012-09-14 DIAGNOSIS — Z961 Presence of intraocular lens: Secondary | ICD-10-CM | POA: Diagnosis not present

## 2012-09-14 DIAGNOSIS — H409 Unspecified glaucoma: Secondary | ICD-10-CM | POA: Diagnosis not present

## 2012-09-14 DIAGNOSIS — H4011X0 Primary open-angle glaucoma, stage unspecified: Secondary | ICD-10-CM | POA: Diagnosis not present

## 2012-09-14 DIAGNOSIS — E119 Type 2 diabetes mellitus without complications: Secondary | ICD-10-CM | POA: Diagnosis not present

## 2012-09-18 ENCOUNTER — Other Ambulatory Visit: Payer: Self-pay

## 2012-09-18 MED ORDER — CITALOPRAM HYDROBROMIDE 20 MG PO TABS: 20.0000 mg | ORAL_TABLET | Freq: Every day | ORAL | Status: DC

## 2012-09-25 DIAGNOSIS — I359 Nonrheumatic aortic valve disorder, unspecified: Secondary | ICD-10-CM | POA: Diagnosis not present

## 2012-09-25 DIAGNOSIS — Z7901 Long term (current) use of anticoagulants: Secondary | ICD-10-CM | POA: Diagnosis not present

## 2012-10-21 ENCOUNTER — Ambulatory Visit (INDEPENDENT_AMBULATORY_CARE_PROVIDER_SITE_OTHER): Payer: Medicare Other | Admitting: Family Medicine

## 2012-10-21 ENCOUNTER — Emergency Department (HOSPITAL_COMMUNITY): Payer: Medicare Other

## 2012-10-21 ENCOUNTER — Encounter (HOSPITAL_COMMUNITY): Payer: Self-pay | Admitting: Family Medicine

## 2012-10-21 ENCOUNTER — Other Ambulatory Visit: Payer: Self-pay | Admitting: Internal Medicine

## 2012-10-21 ENCOUNTER — Inpatient Hospital Stay (HOSPITAL_COMMUNITY)
Admission: EM | Admit: 2012-10-21 | Discharge: 2012-10-24 | DRG: 312 | Disposition: A | Discharge status: Home / Self Care | Payer: Medicare Other | Attending: Internal Medicine | Admitting: Internal Medicine

## 2012-10-21 VITALS — BP 84/49 | HR 57 | Resp 16

## 2012-10-21 DIAGNOSIS — E860 Dehydration: Secondary | ICD-10-CM

## 2012-10-21 DIAGNOSIS — Z6836 Body mass index (BMI) 36.0-36.9, adult: Secondary | ICD-10-CM | POA: Diagnosis not present

## 2012-10-21 DIAGNOSIS — Z954 Presence of other heart-valve replacement: Secondary | ICD-10-CM

## 2012-10-21 DIAGNOSIS — I519 Heart disease, unspecified: Secondary | ICD-10-CM | POA: Diagnosis not present

## 2012-10-21 DIAGNOSIS — Z7982 Long term (current) use of aspirin: Secondary | ICD-10-CM

## 2012-10-21 DIAGNOSIS — H409 Unspecified glaucoma: Secondary | ICD-10-CM | POA: Diagnosis present

## 2012-10-21 DIAGNOSIS — I739 Peripheral vascular disease, unspecified: Secondary | ICD-10-CM | POA: Diagnosis present

## 2012-10-21 DIAGNOSIS — I5033 Acute on chronic diastolic (congestive) heart failure: Secondary | ICD-10-CM

## 2012-10-21 DIAGNOSIS — I5189 Other ill-defined heart diseases: Secondary | ICD-10-CM

## 2012-10-21 DIAGNOSIS — E785 Hyperlipidemia, unspecified: Secondary | ICD-10-CM

## 2012-10-21 DIAGNOSIS — R42 Dizziness and giddiness: Secondary | ICD-10-CM

## 2012-10-21 DIAGNOSIS — I509 Heart failure, unspecified: Secondary | ICD-10-CM | POA: Diagnosis present

## 2012-10-21 DIAGNOSIS — J4489 Other specified chronic obstructive pulmonary disease: Secondary | ICD-10-CM | POA: Diagnosis present

## 2012-10-21 DIAGNOSIS — Z952 Presence of prosthetic heart valve: Secondary | ICD-10-CM

## 2012-10-21 DIAGNOSIS — R55 Syncope and collapse: Principal | ICD-10-CM

## 2012-10-21 DIAGNOSIS — F3289 Other specified depressive episodes: Secondary | ICD-10-CM | POA: Diagnosis present

## 2012-10-21 DIAGNOSIS — E1143 Type 2 diabetes mellitus with diabetic autonomic (poly)neuropathy: Secondary | ICD-10-CM | POA: Diagnosis present

## 2012-10-21 DIAGNOSIS — D649 Anemia, unspecified: Secondary | ICD-10-CM | POA: Diagnosis present

## 2012-10-21 DIAGNOSIS — I2510 Atherosclerotic heart disease of native coronary artery without angina pectoris: Secondary | ICD-10-CM

## 2012-10-21 DIAGNOSIS — Z131 Encounter for screening for diabetes mellitus: Secondary | ICD-10-CM | POA: Diagnosis not present

## 2012-10-21 DIAGNOSIS — K219 Gastro-esophageal reflux disease without esophagitis: Secondary | ICD-10-CM | POA: Diagnosis present

## 2012-10-21 DIAGNOSIS — R404 Transient alteration of awareness: Secondary | ICD-10-CM | POA: Diagnosis not present

## 2012-10-21 DIAGNOSIS — F329 Major depressive disorder, single episode, unspecified: Secondary | ICD-10-CM | POA: Diagnosis present

## 2012-10-21 DIAGNOSIS — R5381 Other malaise: Secondary | ICD-10-CM | POA: Diagnosis not present

## 2012-10-21 DIAGNOSIS — D696 Thrombocytopenia, unspecified: Secondary | ICD-10-CM | POA: Diagnosis not present

## 2012-10-21 DIAGNOSIS — E669 Obesity, unspecified: Secondary | ICD-10-CM | POA: Diagnosis present

## 2012-10-21 DIAGNOSIS — E119 Type 2 diabetes mellitus without complications: Secondary | ICD-10-CM | POA: Diagnosis not present

## 2012-10-21 DIAGNOSIS — R1319 Other dysphagia: Secondary | ICD-10-CM | POA: Diagnosis not present

## 2012-10-21 DIAGNOSIS — R6881 Early satiety: Secondary | ICD-10-CM | POA: Diagnosis not present

## 2012-10-21 DIAGNOSIS — I959 Hypotension, unspecified: Secondary | ICD-10-CM

## 2012-10-21 DIAGNOSIS — D509 Iron deficiency anemia, unspecified: Secondary | ICD-10-CM | POA: Diagnosis not present

## 2012-10-21 DIAGNOSIS — I059 Rheumatic mitral valve disease, unspecified: Secondary | ICD-10-CM | POA: Diagnosis not present

## 2012-10-21 DIAGNOSIS — Z800 Family history of malignant neoplasm of digestive organs: Secondary | ICD-10-CM | POA: Diagnosis not present

## 2012-10-21 DIAGNOSIS — R1314 Dysphagia, pharyngoesophageal phase: Secondary | ICD-10-CM | POA: Diagnosis not present

## 2012-10-21 DIAGNOSIS — I10 Essential (primary) hypertension: Secondary | ICD-10-CM | POA: Diagnosis present

## 2012-10-21 DIAGNOSIS — R739 Hyperglycemia, unspecified: Secondary | ICD-10-CM

## 2012-10-21 DIAGNOSIS — R7401 Elevation of levels of liver transaminase levels: Secondary | ICD-10-CM | POA: Diagnosis not present

## 2012-10-21 DIAGNOSIS — J449 Chronic obstructive pulmonary disease, unspecified: Secondary | ICD-10-CM | POA: Diagnosis present

## 2012-10-21 DIAGNOSIS — Z5181 Encounter for therapeutic drug level monitoring: Secondary | ICD-10-CM

## 2012-10-21 DIAGNOSIS — E869 Volume depletion, unspecified: Secondary | ICD-10-CM | POA: Diagnosis present

## 2012-10-21 DIAGNOSIS — R7309 Other abnormal glucose: Secondary | ICD-10-CM | POA: Diagnosis not present

## 2012-10-21 DIAGNOSIS — K7689 Other specified diseases of liver: Secondary | ICD-10-CM | POA: Diagnosis not present

## 2012-10-21 DIAGNOSIS — I359 Nonrheumatic aortic valve disorder, unspecified: Secondary | ICD-10-CM | POA: Diagnosis not present

## 2012-10-21 DIAGNOSIS — K7460 Unspecified cirrhosis of liver: Secondary | ICD-10-CM | POA: Diagnosis not present

## 2012-10-21 DIAGNOSIS — R1310 Dysphagia, unspecified: Secondary | ICD-10-CM | POA: Diagnosis not present

## 2012-10-21 DIAGNOSIS — D500 Iron deficiency anemia secondary to blood loss (chronic): Secondary | ICD-10-CM | POA: Diagnosis present

## 2012-10-21 DIAGNOSIS — Z7901 Long term (current) use of anticoagulants: Secondary | ICD-10-CM | POA: Diagnosis not present

## 2012-10-21 DIAGNOSIS — H4020X0 Unspecified primary angle-closure glaucoma, stage unspecified: Secondary | ICD-10-CM | POA: Diagnosis present

## 2012-10-21 DIAGNOSIS — R5383 Other fatigue: Secondary | ICD-10-CM | POA: Diagnosis not present

## 2012-10-21 DIAGNOSIS — R29818 Other symptoms and signs involving the nervous system: Secondary | ICD-10-CM | POA: Diagnosis not present

## 2012-10-21 DIAGNOSIS — I5031 Acute diastolic (congestive) heart failure: Secondary | ICD-10-CM

## 2012-10-21 DIAGNOSIS — Z8679 Personal history of other diseases of the circulatory system: Secondary | ICD-10-CM

## 2012-10-21 LAB — URINALYSIS, ROUTINE W REFLEX MICROSCOPIC
Bilirubin Urine: NEGATIVE
Glucose, UA: NEGATIVE mg/dL
Hgb urine dipstick: NEGATIVE
Ketones, ur: NEGATIVE mg/dL
Nitrite: NEGATIVE
Protein, ur: NEGATIVE mg/dL
Specific Gravity, Urine: 1.008 (ref 1.005–1.030)
Urobilinogen, UA: 0.2 mg/dL (ref 0.0–1.0)
pH: 7 (ref 5.0–8.0)

## 2012-10-21 LAB — BASIC METABOLIC PANEL
BUN: 21 mg/dL (ref 6–23)
CO2: 25 mEq/L (ref 19–32)
Calcium: 9.7 mg/dL (ref 8.4–10.5)
Chloride: 101 mEq/L (ref 96–112)
Creatinine, Ser: 0.78 mg/dL (ref 0.50–1.10)
GFR calc Af Amer: >90 mL/min (ref >90)
GFR calc non Af Amer: 85 mL/min — ABNORMAL LOW (ref >90)
Glucose, Bld: 229 mg/dL — ABNORMAL HIGH (ref 70–99)
Potassium: 4.1 mEq/L (ref 3.5–5.1)
Sodium: 138 mEq/L (ref 135–145)

## 2012-10-21 LAB — CBC WITH DIFFERENTIAL/PLATELET
Basophils Absolute: 0 10*3/uL (ref 0.0–0.1)
Basophils Relative: 1 % (ref 0–1)
Eosinophils Absolute: 0.1 10*3/uL (ref 0.0–0.7)
Eosinophils Relative: 2 % (ref 0–5)
HCT: 33.5 % — ABNORMAL LOW (ref 36.0–46.0)
Hemoglobin: 10.8 g/dL — ABNORMAL LOW (ref 12.0–15.0)
Lymphocytes Relative: 40 % (ref 12–46)
Lymphs Abs: 1.8 10*3/uL (ref 0.7–4.0)
MCH: 28.4 pg (ref 26.0–34.0)
MCHC: 32.2 g/dL (ref 30.0–36.0)
MCV: 88.2 fL (ref 78.0–100.0)
Monocytes Absolute: 0.3 10*3/uL (ref 0.1–1.0)
Monocytes Relative: 7 % (ref 3–12)
Neutro Abs: 2.3 10*3/uL (ref 1.7–7.7)
Neutrophils Relative %: 51 % (ref 43–77)
Platelets: 122 10*3/uL — ABNORMAL LOW (ref 150–400)
RBC: 3.8 MIL/uL — ABNORMAL LOW (ref 3.87–5.11)
RDW: 16 % — ABNORMAL HIGH (ref 11.5–15.5)
WBC: 4.5 10*3/uL (ref 4.0–10.5)

## 2012-10-21 LAB — URINE MICROSCOPIC-ADD ON

## 2012-10-21 LAB — PROTIME-INR
INR: 2.86 — ABNORMAL HIGH (ref 0.00–1.49)
Prothrombin Time: 28.5 seconds — ABNORMAL HIGH (ref 11.6–15.2)

## 2012-10-21 LAB — APTT: aPTT: 49 seconds — ABNORMAL HIGH (ref 24–37)

## 2012-10-21 LAB — D-DIMER, QUANTITATIVE: D-Dimer, Quant: <0.27 ug/mL-FEU (ref 0.00–0.48)

## 2012-10-21 LAB — GLUCOSE, CAPILLARY
Glucose-Capillary: 226 mg/dL — ABNORMAL HIGH (ref 70–99)
Glucose-Capillary: 269 mg/dL — ABNORMAL HIGH (ref 70–99)

## 2012-10-21 LAB — TROPONIN I
Troponin I: <0.3 ng/mL (ref <0.30)
Troponin I: <0.3 ng/mL (ref <0.30)

## 2012-10-21 LAB — POCT I-STAT TROPONIN I: Troponin i, poc: 0 ng/mL (ref 0.00–0.08)

## 2012-10-21 LAB — OCCULT BLOOD, POC DEVICE: Fecal Occult Bld: NEGATIVE

## 2012-10-21 LAB — TSH: TSH: 1.494 u[IU]/mL (ref 0.350–4.500)

## 2012-10-21 LAB — GLUCOSE, POCT (MANUAL RESULT ENTRY): POC Glucose: 312 mg/dl — AB (ref 70–99)

## 2012-10-21 LAB — POCT GLYCOSYLATED HEMOGLOBIN (HGB A1C): Hemoglobin A1C: 6.4

## 2012-10-21 MED ORDER — ATORVASTATIN CALCIUM 20 MG PO TABS
20.0000 mg | ORAL_TABLET | Freq: Every day | ORAL | Status: DC
  Administered 2012-10-21 – 2012-10-24 (×4): 20 mg via ORAL
  Filled 2012-10-21 (×4): qty 1

## 2012-10-21 MED ORDER — FOLIC ACID 1 MG PO TABS
1.0000 mg | ORAL_TABLET | Freq: Every day | ORAL | Status: DC
  Administered 2012-10-21 – 2012-10-24 (×4): 1 mg via ORAL
  Filled 2012-10-21 (×4): qty 1

## 2012-10-21 MED ORDER — PREDNISONE 20 MG PO TABS
50.0000 mg | ORAL_TABLET | Freq: Once | ORAL | Status: AC
  Administered 2012-10-21: 50 mg via ORAL

## 2012-10-21 MED ORDER — DIPHENHYDRAMINE HCL 50 MG PO CAPS
50.0000 mg | ORAL_CAPSULE | Freq: Once | ORAL | Status: AC
  Administered 2012-10-22: 50 mg via ORAL
  Filled 2012-10-21: qty 1

## 2012-10-21 MED ORDER — PREDNISONE 50 MG PO TABS
50.0000 mg | ORAL_TABLET | Freq: Once | ORAL | Status: AC
  Administered 2012-10-21: 50 mg via ORAL
  Filled 2012-10-21: qty 3

## 2012-10-21 MED ORDER — ACETAMINOPHEN 650 MG RE SUPP: 650.0000 mg | Freq: Four times a day (QID) | RECTAL | Status: DC | PRN

## 2012-10-21 MED ORDER — WARFARIN - PHARMACIST DOSING INPATIENT
Freq: Every day | Status: DC
  Administered 2012-10-21: 18:00:00

## 2012-10-21 MED ORDER — VITAMIN C 500 MG PO TABS
500.0000 mg | ORAL_TABLET | Freq: Every day | ORAL | Status: DC
  Administered 2012-10-21 – 2012-10-24 (×4): 500 mg via ORAL
  Filled 2012-10-21 (×4): qty 1

## 2012-10-21 MED ORDER — ONDANSETRON HCL 4 MG/2ML IJ SOLN: 4.0000 mg | Freq: Four times a day (QID) | INTRAMUSCULAR | Status: DC | PRN

## 2012-10-21 MED ORDER — ASPIRIN EC 81 MG PO TBEC
81.0000 mg | DELAYED_RELEASE_TABLET | Freq: Every day | ORAL | Status: DC
  Administered 2012-10-21: 81 mg via ORAL
  Filled 2012-10-21 (×2): qty 1

## 2012-10-21 MED ORDER — SODIUM CHLORIDE 0.9 % IJ SOLN
3.0000 mL | Freq: Two times a day (BID) | INTRAMUSCULAR | Status: DC
Start: 2012-10-21 — End: 2012-10-24
  Administered 2012-10-21 – 2012-10-24 (×4): 3 mL via INTRAVENOUS

## 2012-10-21 MED ORDER — SODIUM CHLORIDE 0.9 % IV SOLN
INTRAVENOUS | Status: AC
  Administered 2012-10-21: 14:00:00 via INTRAVENOUS

## 2012-10-21 MED ORDER — PREDNISONE 50 MG PO TABS
50.0000 mg | ORAL_TABLET | Freq: Once | ORAL | Status: AC
  Administered 2012-10-22: 50 mg via ORAL
  Filled 2012-10-21 (×2): qty 1

## 2012-10-21 MED ORDER — ACETAMINOPHEN 325 MG PO TABS
650.0000 mg | ORAL_TABLET | Freq: Four times a day (QID) | ORAL | Status: DC | PRN
  Administered 2012-10-22: 650 mg via ORAL
  Filled 2012-10-21: qty 2

## 2012-10-21 MED ORDER — METFORMIN HCL 500 MG PO TABS
500.0000 mg | ORAL_TABLET | Freq: Two times a day (BID) | ORAL | Status: DC
  Administered 2012-10-21 – 2012-10-24 (×7): 500 mg via ORAL
  Filled 2012-10-21 (×8): qty 1

## 2012-10-21 MED ORDER — ZOLPIDEM TARTRATE 5 MG PO TABS
5.0000 mg | ORAL_TABLET | Freq: Every evening | ORAL | Status: DC | PRN
  Administered 2012-10-22 – 2012-10-23 (×2): 5 mg via ORAL
  Filled 2012-10-21 (×2): qty 1

## 2012-10-21 MED ORDER — LATANOPROST 0.005 % OP SOLN
1.0000 [drp] | Freq: Every day | OPHTHALMIC | Status: DC
  Administered 2012-10-21 – 2012-10-23 (×3): 1 [drp] via OPHTHALMIC
  Filled 2012-10-21 (×2): qty 2.5

## 2012-10-21 MED ORDER — SODIUM CHLORIDE 0.9 % IV SOLN
INTRAVENOUS | Status: DC
  Administered 2012-10-21: 18:00:00 via INTRAVENOUS
  Administered 2012-10-22: 500 mL via INTRAVENOUS
  Administered 2012-10-22: 1000 mL via INTRAVENOUS
  Administered 2012-10-23: 07:00:00 via INTRAVENOUS

## 2012-10-21 MED ORDER — BISACODYL 5 MG PO TBEC: 5.0000 mg | DELAYED_RELEASE_TABLET | Freq: Every day | ORAL | Status: DC | PRN

## 2012-10-21 MED ORDER — WARFARIN SODIUM 3 MG PO TABS
3.0000 mg | ORAL_TABLET | Freq: Every day | ORAL | Status: DC
  Administered 2012-10-21: 3 mg via ORAL
  Filled 2012-10-21 (×2): qty 1

## 2012-10-21 MED ORDER — ONDANSETRON HCL 4 MG PO TABS: 4.0000 mg | ORAL_TABLET | Freq: Four times a day (QID) | ORAL | Status: DC | PRN

## 2012-10-21 MED ORDER — CYCLOBENZAPRINE HCL 10 MG PO TABS
10.0000 mg | ORAL_TABLET | Freq: Three times a day (TID) | ORAL | Status: DC | PRN
  Administered 2012-10-22 (×2): 10 mg via ORAL
  Filled 2012-10-21 (×2): qty 1

## 2012-10-21 MED ORDER — TRAMADOL HCL 50 MG PO TABS
50.0000 mg | ORAL_TABLET | Freq: Three times a day (TID) | ORAL | Status: DC | PRN
Start: 2012-10-21 — End: 2012-10-24
  Administered 2012-10-22 – 2012-10-23 (×2): 50 mg via ORAL
  Filled 2012-10-21 (×2): qty 1

## 2012-10-21 MED ORDER — CALCIUM CARBONATE-VITAMIN D 500-200 MG-UNIT PO TABS
1.0000 | ORAL_TABLET | Freq: Every day | ORAL | Status: DC
  Administered 2012-10-22 – 2012-10-24 (×3): 1 via ORAL
  Filled 2012-10-21 (×4): qty 1

## 2012-10-21 MED ORDER — CITALOPRAM HYDROBROMIDE 20 MG PO TABS
20.0000 mg | ORAL_TABLET | Freq: Every day | ORAL | Status: DC
  Administered 2012-10-21 – 2012-10-24 (×4): 20 mg via ORAL
  Filled 2012-10-21 (×4): qty 1

## 2012-10-21 MED ORDER — PREDNISONE 50 MG PO TABS
50.0000 mg | ORAL_TABLET | Freq: Once | ORAL | Status: AC
  Administered 2012-10-21: 50 mg via ORAL
  Filled 2012-10-21: qty 1

## 2012-10-21 MED ORDER — INSULIN ASPART 100 UNIT/ML ~~LOC~~ SOLN
0.0000 [IU] | Freq: Three times a day (TID) | SUBCUTANEOUS | Status: DC
  Administered 2012-10-21 – 2012-10-22 (×2): 5 [IU] via SUBCUTANEOUS
  Administered 2012-10-22: 3 [IU] via SUBCUTANEOUS
  Administered 2012-10-22: 8 [IU] via SUBCUTANEOUS
  Administered 2012-10-23: 5 [IU] via SUBCUTANEOUS

## 2012-10-21 MED ORDER — INSULIN ASPART 100 UNIT/ML ~~LOC~~ SOLN
0.0000 [IU] | Freq: Every day | SUBCUTANEOUS | Status: DC
  Administered 2012-10-21: 3 [IU] via SUBCUTANEOUS

## 2012-10-21 NOTE — H&P (Signed)
[facility] History and Physical  Abigail Oliver KGM:[id number] DOB: [birth year] DOA: 10/21/2012  Referring physician:  PCP: Illene Regulus, MD  Specialists:  Chief Complaint: dizziness/presyncope  HPI: Abigail Oliver is a 67 y.o. female with history of CAD, Aortic valve disease, status post AVR, diabetes mellitus who presents with above complaints. She states that for the past 3 days she's had dizziness/light headedness, and when she got to work this morning she felt very lightheaded>> so she sat in the car for sometime till she began to feel better then she went to get up. After getting and she began to feel lightheaded again and diaphoretic. She denies chest pain, focal weakness, nausea vomiting,  Diarrhea, melena, hematochezia and no cough, fevers or dysuria. She continued to feel dizzy on her drive back home and when she got home she felt so weak she was unable to get out of the car till her husband came out to help her. She initially went to the urgent care but was transported to the ED and blood glucose was 226, and she was hypotensive with a blood pressure of 84/49 on arrival. She was bolused with IV fluids and SBP improved to the 120s. She admit to poor po intake but no N/V/D as already mentioned.Troponin and d-dimer in the ED were negative. She complained of some back pain, Urinalysis was unremarkable, hgb 10.8, down from 12.6 in April, hemoccult neg and  CT scan of her abdomen and pelvis was ordered per EDP to evaluate for possible leaking aortic aneurysm. But due to contrast allergy she needs premedication>> she is admitted for further evaluation and management.   Review of Systems: The patient denies anorexia, fever, weight loss, vision loss, decreased hearing, hoarseness, chest pain, syncope, dyspnea on exertion, peripheral edema, balance deficits, hemoptysis, abdominal pain, melena, hematochezia, severe indigestion/heartburn, hematuria, incontinence, genital sores, muscle  weakness, suspicious skin lesions, transient blindness, , depression, unusual weight change, abnormal bleeding.   Past Medical History  Diagnosis Date  . Coronary atherosclerosis of native coronary artery   . Hyperlipidemia   . Chronic angle-closure glaucoma(365.23)   . Aortic valve disorders   . Peripheral vascular disease   . Type II diabetes mellitus   . Depression    Past Surgical History  Procedure Laterality Date  . Carpal tunnel release  1989    bilaterally  . Cholecystectomy  1990  . Peripheral arterial stent graft  09/20/11    right SFA  . Cardiac valve replacement  2000    aortic valve   . Laceration repair      right hand  . Neuroplasty / transposition ulnar nerve at elbow      right  . Tonsillectomy  1968  . Vaginal hysterectomy  1985    Fibroids  . Bilateral oophorectomy  1987  . Cesarean section  1969; 1971  . Breast biopsy  2012    left  . Lymph node biopsy  06/2011    "core needle on 5"  . Cataract extraction w/ intraocular lens  implant, bilateral  ~ 2007  . Refractive surgery  ` 2004    "for glaucoma"  . Needle biopsy  2009    "on ankles for nerve damage"   Social History:  reports that she has never smoked. She has never used smokeless tobacco. She reports that she does not drink alcohol or use illicit drugs.  where does patient live--home  Can patient participate in ADLs-yes  Allergies  Allergen Reactions  . Iodinated Diagnostic Agents  Rash     Red rash after cardiac cath 1 wk ago, ? Contrast allergy, requires 13 hr prep now per [email or web address]    Family History  Problem Relation Age of Onset  . Cancer Mother     Colon  . COPD Mother   . Emphysema Mother   . Diabetes Neg Hx     Prior to Admission medications   Medication Sig Start Date End Date Taking? Authorizing Provider  Ascorbic Acid (VITAMIN C) 500 MG tablet Take 500 mg by mouth daily.     Yes Historical Provider, MD  aspirin EC 81 MG tablet Take 81 mg by mouth daily.   Yes  Historical Provider, MD  calcium-vitamin D (CALCIUM 500+D) 500-200 MG-UNIT per tablet Take 1 tablet by mouth daily.   Yes Historical Provider, MD  citalopram (CELEXA) 20 MG tablet Take 1 tablet (20 mg total) by mouth daily. 09/18/12  Yes Jacques Navy, MD  cyclobenzaprine (FLEXERIL) 10 MG tablet Take 10 mg by mouth 3 (three) times daily as needed for muscle spasms.   Yes Historical Provider, MD  folic acid (FOLVITE) 1 MG tablet Take 1 mg by mouth daily.    Yes Historical Provider, MD  latanoprost (XALATAN) 0.005 % ophthalmic solution Place 1 drop into both eyes at bedtime.   Yes Historical Provider, MD  losartan (COZAAR) 25 MG tablet Take 1 by mouth daily 08/27/12  Yes Historical Provider, MD  metFORMIN (GLUCOPHAGE) 500 MG tablet Take 1 tablet (500 mg total) by mouth 2 (two) times daily with a meal. 02/10/12  Yes Jacques Navy, MD  metoprolol succinate (TOPROL-XL) 50 MG 24 hr tablet Take 50 mg by mouth 2 (two) times daily. Take with or immediately following a meal.   Yes Historical Provider, MD  potassium chloride (K-DUR,KLOR-CON) 10 MEQ tablet Take 1 tablet (10 mEq total) by mouth 2 (two) times daily. 03/28/12  Yes Jacques Navy, MD  rosuvastatin (CRESTOR) 10 MG tablet Take 10 mg by mouth at bedtime.   Yes Historical Provider, MD  torsemide (DEMADEX) 20 MG tablet Take 20 mg by mouth daily. 09/09/10  Yes Jacques Navy, MD  traMADol (ULTRAM) 50 MG tablet Take 1 tablet (50 mg total) by mouth every 8 (eight) hours as needed for pain. 09/11/12  Yes Newt Lukes, MD  warfarin (COUMADIN) 3 MG tablet Take 3 mg by mouth daily.   Yes Historical Provider, MD  zolpidem (AMBIEN) 10 MG tablet Take 5 mg by mouth at bedtime as needed. For sleep 02/10/12  Yes Jacques Navy, MD  glucose blood (ONE TOUCH ULTRA TEST) test strip Use as instructed once daily to test blood glucose. Dx: 250.0 10/19/11 10/18/12  Jacques Navy, MD  Lancets [facility] ULTRASOFT) lancets Use as instructed once daily to test blood  glucose. Dx: 250.0 08/18/11 08/17/12  Jacques Navy, MD   Physical Exam: Filed Vitals:   10/21/12 1100 10/21/12 1140 10/21/12 1200 10/21/12 1326  BP: 125/58 115/53 104/34 123/47  Pulse: 58  57 58  Temp:    97.9 F (36.6 C)  TempSrc:    Oral  Resp: 21 20 17 16   Height:    5\' 2"  (1.575 m)  Weight:    90.2 kg (198 lb 13.7 oz)  SpO2: 97% 97% 92% 100%   Constitutional: Vital signs reviewed.  Patient is a well-developed and well-nourished  in no acute distress and cooperative with exam. Alert and oriented x3.  Head: Normocephalic and atraumatic Nose: No erythema or  drainage noted.  Turbinates normal Mouth: no erythema or exudates, dry mucous membranes Eyes: PERRL, EOMI, conjunctivae normal, No scleral icterus.  Neck: Supple, Trachea midline normal ROM, No JVD, mass, thyromegaly, or carotid bruit present.  Cardiovascular: RRR, S1 normal, S2 normal, pulses symmetric and intact bilaterally Pulmonary/Chest: normal respiratory effort, CTAB, no wheezes, rales, or rhonchi Abdominal: Soft. Non-tender, non-distended, bowel sounds are normal, no masses, organomegaly, or guarding present.  Extremities: No cyanosis and no edema  Neurological: A&O x3, Strength is normal and symmetric bilaterally, cranial nerve II-XII are grossly intact, no focal motor deficit, sensory grossly intact. Skin: Warm, dry and intact. No rash.  Psychiatric: Normal mood and affect. speech and behavior is normal. Judgment and thought content normal.   Labs on Admission:  Basic Metabolic Panel:  Recent Labs Lab 10/21/12 1005  NA 138  K 4.1  CL 101  CO2 25  GLUCOSE 229*  BUN 21  CREATININE 0.78  CALCIUM 9.7   Liver Function Tests: No results found for this basename: AST, ALT, ALKPHOS, BILITOT, PROT, ALBUMIN,  in the last 168 hours No results found for this basename: LIPASE, AMYLASE,  in the last 168 hours No results found for this basename: AMMONIA,  in the last 168 hours CBC:  Recent Labs Lab 10/21/12 1005   WBC 4.5  NEUTROABS 2.3  HGB 10.8*  HCT 33.5*  MCV 88.2  PLT 122*   Cardiac Enzymes: No results found for this basename: CKTOTAL, CKMB, CKMBINDEX, TROPONINI,  in the last 168 hours  BNP (last 3 results) No results found for this basename: PROBNP,  in the last 8760 hours CBG:  Recent Labs Lab 10/21/12 0947  GLUCAP 226*    Radiological Exams on Admission: Dg Chest 2 View  10/21/2012   *RADIOLOGY REPORT*  Clinical Data: Dizziness and weakness  CHEST - 2 VIEW  Comparison: 02/07/2012  Findings: Changes from cardiac surgery and valve replacement are stable.  The cardiac silhouette is mildly enlarged.  No mediastinal or hilar masses.  The lungs are clear.  No pleural effusion or pneumothorax.  The bony thorax is demineralized but intact.  IMPRESSION: No acute cardiopulmonary disease.   Original Report Authenticated By: Amie Portland, M.D.      Assessment/Plan Active Problems:   Dizziness/presyncope -As discussed above, possibly secondary to the hypotension/orthostasis -Cycle troponins, Neuro exam is nonfocal -Continue hydration with IV fluids, hold antihypertensives for now. -Her hemoglobin is 2 g down from April, and she is guaiac-negative>> followup on this CT ordered the EDP to evaluate for possible aortic aneurysm leak she also had some back pain on presentation. -defer further eval to her PCP in am pending above studies  Hypotension -Possibly secondary to hypovolemia and meds -Discussed above improved with hydration, will continue IVF  -hold BP meds for now as above, follow and resume when clinically appropriate -Follow and recheck hemoglobin in a.m., guaiac negative. Followup on CT scan of abdomen ordered with premedication as above. -UA and chest x-ray with no evidence of infection   DM, Type 2, NIDDM -Continue metformin, monitor Accu-Cheks cover with sliding scale insulin   H/O prosthetic aortic valve replacement, [employer] 2000 -Continue Coumadin per pharmacy, INR  therapeutic.   Coronary atherosclerosis of native coronary artery  -She was chest pain-free, follow CEs as above Volume depletion -Hydrate follow and recheck the, hold diuretic for now.      Code Status: full Family Communication: none at bedside Disposition Plan: admit to tele  Time spent: >74mins  Genevie Elman C [facility] Pager [phone]  If 7PM-7AM, please contact night-coverage [email or web address] Password [facility] 10/21/2012, 3:05 PM

## 2012-10-21 NOTE — ED Provider Notes (Signed)
Medical screening examination/treatment/procedure(s) were conducted as a shared visit with non-physician practitioner(s) and myself.  I personally evaluated the patient during the encounter She with history of valve replacements with 2 hours as near-syncope with lightheadedness and hypotension.  2 g of hemoglobin lower than April where she has been consistently at 12 for the last one year. No signs of GI bleeding but states she's had severe back pain for the last week. Labs are unrevealing chest x-ray within normal limits wanted to evaluate for leaking AAA however patient is allergic IV contrast will be admitted for further  Gwyneth Sprout, MD 10/21/12 1556

## 2012-10-21 NOTE — ED Notes (Signed)
Return from xray

## 2012-10-21 NOTE — Progress Notes (Signed)
Subjective:    Patient ID: Abigail Oliver, female    DOB: Nov 10, 1945, 67 y.o.   MRN: [id number]  HPI  Ms. Pinckney is a 67 yo w/ a PMHx of CAD, aortic valve replacement, and DMII who was in her normal state of health until she drove to work this morning.  She suddenly felt presyncopal and extremely weak- could not get out of her car.  Took all of her meds this morning - DM is mild - well controlled on metformin 500 bid w/ a hgba1c of 7.2.  However, after her sxs resolved and she was able to get into work (works as a Producer, television/film/video) checked her cbg and found to be 325.  The sxs kept recurring so she called and cancelled her clients then drove home. She kept feeling like she was going to pass out on the drive so she called her son to talk to her. Her son told her that her speech was slurred and he kept "loosing her".  Upon arrival pt was noted to be diaphoretic and tremulous.  Took all of her meds this morning as usual and did not accidentally take any twice.  Past Medical History  Diagnosis Date  . Coronary atherosclerosis of native coronary artery   . Hyperlipidemia   . Chronic angle-closure glaucoma(365.23)   . Aortic valve disorders   . Peripheral vascular disease   . Type II diabetes mellitus   . Depression    Current Outpatient Prescriptions on File Prior to Visit  Medication Sig Dispense Refill  . Ascorbic Acid (VITAMIN C) 500 MG tablet Take 500 mg by mouth daily.        Marland Kitchen aspirin EC 81 MG tablet Take 81 mg by mouth daily.      . calcium-vitamin D (CALCIUM 500+D) 500-200 MG-UNIT per tablet Take 1 tablet by mouth daily.      . folic acid (FOLVITE) 1 MG tablet Take 1 mg by mouth daily.       Marland Kitchen losartan (COZAAR) 25 MG tablet Take 1 by mouth daily      . metFORMIN (GLUCOPHAGE) 500 MG tablet Take 1 tablet (500 mg total) by mouth 2 (two) times daily with a meal.  60 tablet  10  . metoprolol succinate (TOPROL-XL) 50 MG 24 hr tablet Take 1 tablet (50 mg total) by mouth daily.  30 tablet  6  .  potassium chloride (K-DUR,KLOR-CON) 10 MEQ tablet Take 1 tablet (10 mEq total) by mouth 2 (two) times daily.  60 tablet  10  . warfarin (COUMADIN) 3 MG tablet Take 3 mg by mouth daily.      . citalopram (CELEXA) 20 MG tablet Take 1 tablet (20 mg total) by mouth daily.  30 tablet  0  . glucose blood (ONE TOUCH ULTRA TEST) test strip Use as instructed once daily to test blood glucose. Dx: 250.0  100 each  12  . Lancets (ONETOUCH ULTRASOFT) lancets Use as instructed once daily to test blood glucose. Dx: 250.0  100 each  12  . rosuvastatin (CRESTOR) 10 MG tablet Take 10 mg by mouth at bedtime.      Marland Kitchen tiZANidine (ZANAFLEX) 4 MG tablet Take 1 tablet (4 mg total) by mouth every 6 (six) hours as needed.  30 tablet  0  . torsemide (DEMADEX) 20 MG tablet Take 20 mg by mouth daily.      . traMADol (ULTRAM) 50 MG tablet Take 1 tablet (50 mg total) by mouth every 8 (eight) hours  as needed for pain.  100 tablet  1   No current facility-administered medications on file prior to visit.   Allergies  Allergen Reactions  . Iodinated Diagnostic Agents Rash     Red rash after cardiac cath 1 wk ago, ? Contrast allergy, requires 13 hr prep now per [email or web address]     Review of Systems  Constitutional: Positive for fatigue. Negative for fever and chills.  Respiratory: Negative for shortness of breath.   Cardiovascular: Negative for chest pain and palpitations.  Gastrointestinal: Negative for nausea, vomiting, abdominal pain, diarrhea and constipation.  Musculoskeletal: Positive for gait problem.  Skin: Negative for rash.  Neurological: Positive for dizziness, tremors, speech difficulty, weakness and light-headedness. Negative for seizures, syncope, facial asymmetry and numbness.  Psychiatric/Behavioral: Positive for confusion.      BP 84/49  Pulse 57  Resp 16  SpO2 99% Objective:   Physical Exam  Constitutional: She appears well-developed and well-nourished. She appears lethargic. She appears  distressed.  HENT:  Head: Normocephalic and atraumatic.  Right Ear: External ear normal.  Eyes: Conjunctivae are normal. No scleral icterus.  Cardiovascular: Normal rate and regular rhythm.   Murmur heard.  Systolic murmur is present with a grade of 4/6  s2 click  Pulmonary/Chest: Effort normal and breath sounds normal. No respiratory distress.  Neurological: She appears lethargic. No cranial nerve deficit or sensory deficit. Coordination and gait abnormal.  Skin: Skin is warm. She is diaphoretic. No erythema. There is pallor.  Psychiatric: She has a normal mood and affect. Her behavior is normal.     EKG rhythym nml Assessment & Plan:  Hypotensive, hyperglycemic in pt with known CAD and h/o aortic valve replacement - to [place] emergently but EMS with cardiac monitoring. IV placed by Tinnie Gens PA in left anetcubital

## 2012-10-21 NOTE — ED Provider Notes (Signed)
History     CSN: [id number]  Arrival date & time 10/21/12  0940   First MD Initiated Contact with Patient 10/21/12 [phone]      Chief Complaint  Patient presents with  . Dizziness  . Weakness    (Consider location/radiation/quality/duration/timing/severity/associated sxs/prior treatment) HPI Comments: Patient presents emergency department with chief complaint of dizziness. She states that she became dizzy while driving to work this morning. She then went to urgent care, and was found to have low heart rate and low blood pressure. She was then sent by EMS to the ED for further evaluation. She has a history of CAD, diabetes, and aortic valve replacement. She denies any chest pain or shortness of breath. She states that she did have some low back pain 3 days ago after building some bookshelves. She states that her blood pressure normally runs in the 160s, and that she takes losartan. She has not taken any of her blood pressure medication this morning. She denies numbness, weakness, or tingling of the extremities. She tells me that she made a phone call to her son, who reported that she was having some slurred speech, she is not having any difficulty speaking now.  The history is provided by the patient. No language interpreter was used.    Past Medical History  Diagnosis Date  . Coronary atherosclerosis of native coronary artery   . Hyperlipidemia   . Chronic angle-closure glaucoma(365.23)   . Aortic valve disorders   . Peripheral vascular disease   . Type II diabetes mellitus   . Depression     Past Surgical History  Procedure Laterality Date  . Carpal tunnel release  1989    bilaterally  . Cholecystectomy  1990  . Peripheral arterial stent graft  09/20/11    right SFA  . Cardiac valve replacement  2000    aortic valve   . Laceration repair      right hand  . Neuroplasty / transposition ulnar nerve at elbow      right  . Tonsillectomy  1968  . Vaginal hysterectomy  1985     Fibroids  . Bilateral oophorectomy  1987  . Cesarean section  1969; 1971  . Breast biopsy  2012    left  . Lymph node biopsy  06/2011    "core needle on 5"  . Cataract extraction w/ intraocular lens  implant, bilateral  ~ 2007  . Refractive surgery  ` 2004    "for glaucoma"  . Needle biopsy  2009    "on ankles for nerve damage"    Family History  Problem Relation Age of Onset  . Cancer Mother     Colon  . COPD Mother   . Emphysema Mother   . Diabetes Neg Hx     History  Substance Use Topics  . Smoking status: Never Smoker   . Smokeless tobacco: Never Used  . Alcohol Use: No    OB History   Grav Para Term Preterm Abortions TAB SAB Ect Mult Living                  Review of Systems  All other systems reviewed and are negative.    Allergies  Iodinated diagnostic agents  Home Medications   Current Outpatient Rx  Name  Route  Sig  Dispense  Refill  . Ascorbic Acid (VITAMIN C) 500 MG tablet   Oral   Take 500 mg by mouth daily.           Marland Kitchen  aspirin EC 81 MG tablet   Oral   Take 81 mg by mouth daily.         . calcium-vitamin D (CALCIUM 500+D) 500-200 MG-UNIT per tablet   Oral   Take 1 tablet by mouth daily.         . citalopram (CELEXA) 20 MG tablet   Oral   Take 1 tablet (20 mg total) by mouth daily.   30 tablet   0   . folic acid (FOLVITE) 1 MG tablet   Oral   Take 1 mg by mouth daily.          Marland Kitchen EXPIRED: glucose blood (ONE TOUCH ULTRA TEST) test strip      Use as instructed once daily to test blood glucose. Dx: 250.0   100 each   12   . EXPIRED: Lancets (ONETOUCH ULTRASOFT) lancets      Use as instructed once daily to test blood glucose. Dx: 250.0   100 each   12   . losartan (COZAAR) 25 MG tablet      Take 1 by mouth daily         . metFORMIN (GLUCOPHAGE) 500 MG tablet   Oral   Take 1 tablet (500 mg total) by mouth 2 (two) times daily with a meal.   60 tablet   10   . metoprolol succinate (TOPROL-XL) 50 MG 24 hr tablet    Oral   Take 1 tablet (50 mg total) by mouth daily.   30 tablet   6   . potassium chloride (K-DUR,KLOR-CON) 10 MEQ tablet   Oral   Take 1 tablet (10 mEq total) by mouth 2 (two) times daily.   60 tablet   10   . rosuvastatin (CRESTOR) 10 MG tablet   Oral   Take 10 mg by mouth at bedtime.         Marland Kitchen tiZANidine (ZANAFLEX) 4 MG tablet   Oral   Take 1 tablet (4 mg total) by mouth every 6 (six) hours as needed.   30 tablet   0   . torsemide (DEMADEX) 20 MG tablet   Oral   Take 20 mg by mouth daily.         . traMADol (ULTRAM) 50 MG tablet   Oral   Take 1 tablet (50 mg total) by mouth every 8 (eight) hours as needed for pain.   100 tablet   1   . warfarin (COUMADIN) 3 MG tablet   Oral   Take 3 mg by mouth daily.         Marland Kitchen zolpidem (AMBIEN) 10 MG tablet   Oral   Take 5 mg by mouth at bedtime as needed. For sleep           BP 100/44  Resp 16  SpO2 99%  Physical Exam  Nursing note and vitals reviewed. Constitutional: She is oriented to person, place, and time. She appears well-developed and well-nourished.  HENT:  Head: Normocephalic and atraumatic.  Eyes: Conjunctivae and EOM are normal. Pupils are equal, round, and reactive to light.  Neck: Normal range of motion. Neck supple.  Cardiovascular: Normal rate and regular rhythm.  Exam reveals no gallop and no friction rub.   No murmur heard. Pulmonary/Chest: Effort normal and breath sounds normal. No respiratory distress. She has no wheezes. She has no rales. She exhibits no tenderness.  Abdominal: Soft. Bowel sounds are normal. She exhibits no distension and no mass. There is no tenderness. There  is no rebound and no guarding.  Musculoskeletal: Normal range of motion. She exhibits no edema and no tenderness.  Neurological: She is alert and oriented to person, place, and time.  Skin: Skin is warm and dry.  Psychiatric: She has a normal mood and affect. Her behavior is normal. Judgment and thought content normal.     ED Course  Procedures (including critical care time)  Labs Reviewed  GLUCOSE, CAPILLARY - Abnormal; Notable for the following:    Glucose-Capillary 226 (*)    All other components within normal limits  CBC WITH DIFFERENTIAL  BASIC METABOLIC PANEL   Results for orders placed during the hospital encounter of 10/21/12  GLUCOSE, CAPILLARY      Result Value Range   Glucose-Capillary 226 (*) 70 - 99 mg/dL  CBC WITH DIFFERENTIAL      Result Value Range   WBC 4.5  4.0 - 10.5 K/uL   RBC 3.80 (*) 3.87 - 5.11 MIL/uL   Hemoglobin 10.8 (*) 12.0 - 15.0 g/dL   HCT 96.0 (*) 45.4 - 09.8 %   MCV 88.2  78.0 - 100.0 fL   MCH 28.4  26.0 - 34.0 pg   MCHC 32.2  30.0 - 36.0 g/dL   RDW 11.9 (*) 14.7 - 82.9 %   Platelets 122 (*) 150 - 400 K/uL   Neutrophils Relative % 51  43 - 77 %   Neutro Abs 2.3  1.7 - 7.7 K/uL   Lymphocytes Relative 40  12 - 46 %   Lymphs Abs 1.8  0.7 - 4.0 K/uL   Monocytes Relative 7  3 - 12 %   Monocytes Absolute 0.3  0.1 - 1.0 K/uL   Eosinophils Relative 2  0 - 5 %   Eosinophils Absolute 0.1  0.0 - 0.7 K/uL   Basophils Relative 1  0 - 1 %   Basophils Absolute 0.0  0.0 - 0.1 K/uL  BASIC METABOLIC PANEL      Result Value Range   Sodium 138  135 - 145 mEq/L   Potassium 4.1  3.5 - 5.1 mEq/L   Chloride 101  96 - 112 mEq/L   CO2 25  19 - 32 mEq/L   Glucose, Bld 229 (*) 70 - 99 mg/dL   BUN 21  6 - 23 mg/dL   Creatinine, Ser 5.62  0.50 - 1.10 mg/dL   Calcium 9.7  8.4 - 13.0 mg/dL   GFR calc non Af Amer 85 (*) >90 mL/min   GFR calc Af Amer >90  >90 mL/min  D-DIMER, QUANTITATIVE      Result Value Range   D-Dimer, Quant <0.27  0.00 - 0.48 ug/mL-FEU  PROTIME-INR      Result Value Range   Prothrombin Time 28.5 (*) 11.6 - 15.2 seconds   INR 2.86 (*) 0.00 - 1.49  APTT      Result Value Range   aPTT 49 (*) 24 - 37 seconds  URINALYSIS, ROUTINE W REFLEX MICROSCOPIC      Result Value Range   Color, Urine YELLOW  YELLOW   APPearance CLOUDY (*) CLEAR   Specific Gravity,  Urine 1.008  1.005 - 1.030   pH 7.0  5.0 - 8.0   Glucose, UA NEGATIVE  NEGATIVE mg/dL   Hgb urine dipstick NEGATIVE  NEGATIVE   Bilirubin Urine NEGATIVE  NEGATIVE   Ketones, ur NEGATIVE  NEGATIVE mg/dL   Protein, ur NEGATIVE  NEGATIVE mg/dL   Urobilinogen, UA 0.2  0.0 - 1.0 mg/dL  Nitrite NEGATIVE  NEGATIVE   Leukocytes, UA TRACE (*) NEGATIVE  URINE MICROSCOPIC-ADD ON      Result Value Range   Squamous Epithelial / LPF RARE  RARE   WBC, UA 0-2  <3 WBC/hpf   Bacteria, UA RARE  RARE   Casts HYALINE CASTS (*) NEGATIVE  POCT I-STAT TROPONIN I      Result Value Range   Troponin i, poc 0.00  0.00 - 0.08 ng/mL   Comment 3           OCCULT BLOOD, POC DEVICE      Result Value Range   Fecal Occult Bld NEGATIVE  NEGATIVE   Dg Chest 2 View  10/21/2012   *RADIOLOGY REPORT*  Clinical Data: Dizziness and weakness  CHEST - 2 VIEW  Comparison: 02/07/2012  Findings: Changes from cardiac surgery and valve replacement are stable.  The cardiac silhouette is mildly enlarged.  No mediastinal or hilar masses.  The lungs are clear.  No pleural effusion or pneumothorax.  The bony thorax is demineralized but intact.  IMPRESSION: No acute cardiopulmonary disease.   Original Report Authenticated By: Amie Portland, M.D.     ED ECG REPORT  I personally interpreted this EKG   Date: 10/21/2012   Rate: 58  Rhythm: normal sinus rhythm  QRS Axis: normal  Intervals: normal  ST/T Wave abnormalities: nonspecific T wave changes  Conduction Disutrbances:none  Narrative Interpretation:   Old EKG Reviewed: unchanged    1. Dizziness   2. Hypotension       MDM  Patient with dizziness, and hypotension. Will check basic labs, give fluids, and will reevaluate. She's not had any changes in her medications. She denies chest pain, or shortness of breath. Denies abdominal pain. Denies diarrhea or vomiting. No signs of infection. Patient discussed with Dr. Anitra Lauth.  Patient seen by and discussed with Dr.  Anitra Lauth.  Bedside US performed.  No free fluid observed, however, aorta was obscured 2/2 body habitus.  Concern for leaking AAA based on patient's complaint of low back pain.  This could be MSK, but need to perform CT abdomen to rule out leaking AAA.  D-dimer is negative.  FOBT is negative.  Will admit the patient for further evaluation of decreased Hgb, and hypotension.  Patient will need 13 hour pre-treatment for her contrast allergy which has been ordered.   Protocol is:  Prednisone 50mg  @ 13 hours pre-CT, 7 hours pre-CT, 1 hour pre-CT And Benadryl 50mg  @ 1 hour pre-CT  CT timed and ordered for after pre-treatment      Roxy Horseman, PA-C 10/21/12 1237  Roxy Horseman, PA-C 10/21/12 1246

## 2012-10-21 NOTE — ED Notes (Signed)
Patient transported to X-ray 

## 2012-10-21 NOTE — Progress Notes (Signed)
ANTICOAGULATION CONSULT NOTE - Initial Consult  Pharmacy Consult for Coumadin Indication: Hx of AVR  Allergies  Allergen Reactions  . Iodinated Diagnostic Agents Rash     Red rash after cardiac cath 1 wk ago, ? Contrast allergy, requires 13 hr prep now per dr.gallerani//a.calhoun    Patient Measurements: Height: 5\' 2"  (157.5 cm) Weight: 198 lb 13.7 oz (90.2 kg) IBW/kg (Calculated) : 50.1   Vital Signs: Temp: 97.9 F (36.6 C) (05/31 1326) Temp src: Oral (05/31 1326) BP: 123/47 mmHg (05/31 1326) Pulse Rate: 58 (05/31 1326)  Labs:  Recent Labs  10/21/12 1005  HGB 10.8*  HCT 33.5*  PLT 122*  APTT 49*  LABPROT 28.5*  INR 2.86*  CREATININE 0.78    Estimated Creatinine Clearance: 71.2 ml/min (by C-G formula based on Cr of 0.78).   Medical History: Past Medical History  Diagnosis Date  . Coronary atherosclerosis of native coronary artery   . Hyperlipidemia   . Chronic angle-closure glaucoma(365.23)   . Aortic valve disorders   . Peripheral vascular disease   . Type II diabetes mellitus   . Depression     Medications:  Prescriptions prior to admission  Medication Sig Dispense Refill  . Ascorbic Acid (VITAMIN C) 500 MG tablet Take 500 mg by mouth daily.        Marland Kitchen aspirin EC 81 MG tablet Take 81 mg by mouth daily.      . calcium-vitamin D (CALCIUM 500+D) 500-200 MG-UNIT per tablet Take 1 tablet by mouth daily.      . citalopram (CELEXA) 20 MG tablet Take 1 tablet (20 mg total) by mouth daily.  30 tablet  0  . cyclobenzaprine (FLEXERIL) 10 MG tablet Take 10 mg by mouth 3 (three) times daily as needed for muscle spasms.      . folic acid (FOLVITE) 1 MG tablet Take 1 mg by mouth daily.       Marland Kitchen latanoprost (XALATAN) 0.005 % ophthalmic solution Place 1 drop into both eyes at bedtime.      Marland Kitchen losartan (COZAAR) 25 MG tablet Take 1 by mouth daily      . metFORMIN (GLUCOPHAGE) 500 MG tablet Take 1 tablet (500 mg total) by mouth 2 (two) times daily with a meal.  60 tablet  10   . metoprolol succinate (TOPROL-XL) 50 MG 24 hr tablet Take 50 mg by mouth 2 (two) times daily. Take with or immediately following a meal.      . potassium chloride (K-DUR,KLOR-CON) 10 MEQ tablet Take 1 tablet (10 mEq total) by mouth 2 (two) times daily.  60 tablet  10  . rosuvastatin (CRESTOR) 10 MG tablet Take 10 mg by mouth at bedtime.      . torsemide (DEMADEX) 20 MG tablet Take 20 mg by mouth daily.      . traMADol (ULTRAM) 50 MG tablet Take 1 tablet (50 mg total) by mouth every 8 (eight) hours as needed for pain.  100 tablet  1  . warfarin (COUMADIN) 3 MG tablet Take 3 mg by mouth daily.      Marland Kitchen zolpidem (AMBIEN) 10 MG tablet Take 5 mg by mouth at bedtime as needed. For sleep      . glucose blood (ONE TOUCH ULTRA TEST) test strip Use as instructed once daily to test blood glucose. Dx: 250.0  100 each  12  . Lancets (ONETOUCH ULTRASOFT) lancets Use as instructed once daily to test blood glucose. Dx: 250.0  100 each  12  Assessment: Abigail Oliver has a hx of AVR and is on Coumadin 3mg  daily at home. She is admitted today s/p feeling extremely weak/pre-syncopal. Her INR on admit is therapeutic. H/H and plts are low on admit, no overt bleeding reported. Confirmed that patient has not taken her Coumadin today.  Goal of Therapy:  INR 2-3 Monitor platelets by anticoagulation protocol: Yes   Plan:  - Coumadin 3mg  PO q 1800- resume home dose - Will f/up daily PT/INR  Thanks, Astoria Condon K. Allena Katz, PharmD, BCPS.  Clinical Pharmacist Pager [phone]. 10/21/2012 4:05 PM

## 2012-10-21 NOTE — Progress Notes (Signed)
Pt transported to room from ED. Tele hooked up and CCMD notified. VSS. Orders released. MD to be in shortly. Will continue to monitor pt closely.

## 2012-10-21 NOTE — ED Notes (Signed)
Pt complaining of dizziness and weakness that started this am. BP 105/54. HR 55. 92% on RA. CBG 262. Per son episode of slurred speech and almost passed out. Pt sent here from [facility] with low HR and BP there.

## 2012-10-22 ENCOUNTER — Inpatient Hospital Stay (HOSPITAL_COMMUNITY): Payer: Medicare Other

## 2012-10-22 DIAGNOSIS — Z7901 Long term (current) use of anticoagulants: Secondary | ICD-10-CM

## 2012-10-22 DIAGNOSIS — I2510 Atherosclerotic heart disease of native coronary artery without angina pectoris: Secondary | ICD-10-CM

## 2012-10-22 DIAGNOSIS — Z954 Presence of other heart-valve replacement: Secondary | ICD-10-CM | POA: Diagnosis not present

## 2012-10-22 DIAGNOSIS — R1314 Dysphagia, pharyngoesophageal phase: Secondary | ICD-10-CM

## 2012-10-22 DIAGNOSIS — R42 Dizziness and giddiness: Secondary | ICD-10-CM | POA: Diagnosis not present

## 2012-10-22 DIAGNOSIS — K7689 Other specified diseases of liver: Secondary | ICD-10-CM | POA: Diagnosis not present

## 2012-10-22 DIAGNOSIS — R6881 Early satiety: Secondary | ICD-10-CM | POA: Diagnosis not present

## 2012-10-22 DIAGNOSIS — Z5181 Encounter for therapeutic drug level monitoring: Secondary | ICD-10-CM

## 2012-10-22 DIAGNOSIS — K7460 Unspecified cirrhosis of liver: Secondary | ICD-10-CM | POA: Diagnosis not present

## 2012-10-22 DIAGNOSIS — I359 Nonrheumatic aortic valve disorder, unspecified: Secondary | ICD-10-CM

## 2012-10-22 DIAGNOSIS — I959 Hypotension, unspecified: Secondary | ICD-10-CM

## 2012-10-22 DIAGNOSIS — E119 Type 2 diabetes mellitus without complications: Secondary | ICD-10-CM

## 2012-10-22 DIAGNOSIS — R55 Syncope and collapse: Principal | ICD-10-CM

## 2012-10-22 LAB — CBC
HCT: 34.9 % — ABNORMAL LOW (ref 36.0–46.0)
Hemoglobin: 11.2 g/dL — ABNORMAL LOW (ref 12.0–15.0)
MCH: 28.2 pg (ref 26.0–34.0)
MCHC: 32.1 g/dL (ref 30.0–36.0)
MCV: 87.9 fL (ref 78.0–100.0)
Platelets: 131 10*3/uL — ABNORMAL LOW (ref 150–400)
RBC: 3.97 MIL/uL (ref 3.87–5.11)
RDW: 15.7 % — ABNORMAL HIGH (ref 11.5–15.5)
WBC: 5.4 10*3/uL (ref 4.0–10.5)

## 2012-10-22 LAB — GLUCOSE, CAPILLARY
Glucose-Capillary: 228 mg/dL — ABNORMAL HIGH (ref 70–99)
Glucose-Capillary: 252 mg/dL — ABNORMAL HIGH (ref 70–99)
Glucose-Capillary: 177 mg/dL — ABNORMAL HIGH (ref 70–99)
Glucose-Capillary: 195 mg/dL — ABNORMAL HIGH (ref 70–99)

## 2012-10-22 LAB — FOLATE: Folate: >20 ng/mL

## 2012-10-22 LAB — BASIC METABOLIC PANEL
BUN: 21 mg/dL (ref 6–23)
CO2: 21 mEq/L (ref 19–32)
Calcium: 9.2 mg/dL (ref 8.4–10.5)
Chloride: 100 mEq/L (ref 96–112)
Creatinine, Ser: 0.67 mg/dL (ref 0.50–1.10)
GFR calc Af Amer: >90 mL/min (ref >90)
GFR calc non Af Amer: 89 mL/min — ABNORMAL LOW (ref >90)
Glucose, Bld: 298 mg/dL — ABNORMAL HIGH (ref 70–99)
Potassium: 4.2 mEq/L (ref 3.5–5.1)
Sodium: 135 mEq/L (ref 135–145)

## 2012-10-22 LAB — IRON AND TIBC
Iron: 58 ug/dL (ref 42–135)
Saturation Ratios: 14 % — ABNORMAL LOW (ref 20–55)
TIBC: 423 ug/dL (ref 250–470)
UIBC: 365 ug/dL (ref 125–400)

## 2012-10-22 LAB — RETICULOCYTES
RBC.: 3.9 MIL/uL (ref 3.87–5.11)
Retic Count, Absolute: 93.6 10*3/uL (ref 19.0–186.0)
Retic Ct Pct: 2.4 % (ref 0.4–3.1)

## 2012-10-22 LAB — HEPATITIS B SURFACE ANTIGEN: Hepatitis B Surface Ag: NEGATIVE

## 2012-10-22 LAB — HEPATITIS C ANTIBODY: HCV Ab: NEGATIVE

## 2012-10-22 LAB — PROTIME-INR
INR: 2.88 — ABNORMAL HIGH (ref 0.00–1.49)
Prothrombin Time: 28.7 seconds — ABNORMAL HIGH (ref 11.6–15.2)

## 2012-10-22 LAB — VITAMIN B12: Vitamin B-12: 326 pg/mL (ref 211–911)

## 2012-10-22 LAB — FERRITIN: Ferritin: 31 ng/mL (ref 10–291)

## 2012-10-22 LAB — TROPONIN I: Troponin I: <0.3 ng/mL (ref <0.30)

## 2012-10-22 MED ORDER — PANTOPRAZOLE SODIUM 40 MG PO TBEC
40.0000 mg | DELAYED_RELEASE_TABLET | Freq: Two times a day (BID) | ORAL | Status: DC
  Administered 2012-10-22 – 2012-10-24 (×5): 40 mg via ORAL
  Filled 2012-10-22 (×4): qty 1

## 2012-10-22 MED ORDER — IOHEXOL 300 MG/ML  SOLN
100.0000 mL | Freq: Once | INTRAMUSCULAR | Status: AC | PRN
  Administered 2012-10-22: 100 mL via INTRAVENOUS

## 2012-10-22 NOTE — Progress Notes (Signed)
ANTICOAGULATION CONSULT NOTE - Follow Up   Pharmacy Consult for Coumadin, Lovenox Indication: Hx of AVR  Allergies  Allergen Reactions  . Iodinated Diagnostic Agents Rash     Red rash after cardiac cath 1 wk ago, ? Contrast allergy, requires 13 hr prep now per dr.gallerani//a.calhoun    Patient Measurements: Height: 5\' 2"  (157.5 cm) Weight: 198 lb 13.7 oz (90.2 kg) IBW/kg (Calculated) : 50.1   Vital Signs: Temp: 98.4 F (36.9 C) (06/01 0426) Temp src: Oral (06/01 0426) BP: 140/68 mmHg (06/01 0426) Pulse Rate: 83 (06/01 0426)  Labs:  Recent Labs  10/21/12 1005 10/21/12 1540 10/21/12 2052 10/22/12 0220  HGB 10.8*  --   --  11.2*  HCT 33.5*  --   --  34.9*  PLT 122*  --   --  131*  APTT 49*  --   --   --   LABPROT 28.5*  --   --  28.7*  INR 2.86*  --   --  2.88*  CREATININE 0.78  --   --  0.67  TROPONINI  --  <0.30 <0.30 <0.30    Estimated Creatinine Clearance: 71.2 ml/min (by C-G formula based on Cr of 0.67).   Medical History: Past Medical History  Diagnosis Date  . Coronary atherosclerosis of native coronary artery   . Hyperlipidemia   . Chronic angle-closure glaucoma(365.23)   . Aortic valve disorders   . Peripheral vascular disease   . Type II diabetes mellitus   . Depression     Medications:  Prescriptions prior to admission  Medication Sig Dispense Refill  . Ascorbic Acid (VITAMIN C) 500 MG tablet Take 500 mg by mouth daily.        Marland Kitchen aspirin EC 81 MG tablet Take 81 mg by mouth daily.      . calcium-vitamin D (CALCIUM 500+D) 500-200 MG-UNIT per tablet Take 1 tablet by mouth daily.      . citalopram (CELEXA) 20 MG tablet Take 1 tablet (20 mg total) by mouth daily.  30 tablet  0  . cyclobenzaprine (FLEXERIL) 10 MG tablet Take 10 mg by mouth 3 (three) times daily as needed for muscle spasms.      . folic acid (FOLVITE) 1 MG tablet Take 1 mg by mouth daily.       Marland Kitchen latanoprost (XALATAN) 0.005 % ophthalmic solution Place 1 drop into both eyes at  bedtime.      Marland Kitchen losartan (COZAAR) 25 MG tablet Take 1 by mouth daily      . metFORMIN (GLUCOPHAGE) 500 MG tablet Take 1 tablet (500 mg total) by mouth 2 (two) times daily with a meal.  60 tablet  10  . metoprolol succinate (TOPROL-XL) 50 MG 24 hr tablet Take 50 mg by mouth 2 (two) times daily. Take with or immediately following a meal.      . potassium chloride (K-DUR,KLOR-CON) 10 MEQ tablet Take 1 tablet (10 mEq total) by mouth 2 (two) times daily.  60 tablet  10  . rosuvastatin (CRESTOR) 10 MG tablet Take 10 mg by mouth at bedtime.      . torsemide (DEMADEX) 20 MG tablet Take 20 mg by mouth daily.      . traMADol (ULTRAM) 50 MG tablet Take 1 tablet (50 mg total) by mouth every 8 (eight) hours as needed for pain.  100 tablet  1  . warfarin (COUMADIN) 3 MG tablet Take 3 mg by mouth daily.      Marland Kitchen zolpidem (AMBIEN) 10  MG tablet Take 5 mg by mouth at bedtime as needed. For sleep      . glucose blood (ONE TOUCH ULTRA TEST) test strip Use as instructed once daily to test blood glucose. Dx: 250.0  100 each  12  . Lancets (ONETOUCH ULTRASOFT) lancets Use as instructed once daily to test blood glucose. Dx: 250.0  100 each  12    Assessment: Abigail Oliver has a hx of AVR and is on Coumadin 3mg  daily at home. She is admitted 5/31 s/p feeling extremely weak/pre-syncopal. Her INR on admit was therapeutic. Pharmacy consulted to dose warfarin, then 6/1 to hold warfarin and start lovenox.  Cogag: AVR: INR at goal, will hold today, follow up INR in the am and start lovenox when INR < 2.5, 90 mg sq q12h  GI: H/H and plts are low on admit, no overt bleeding reported. GI consulted plan for GI workup, when INR < 1.7, patient egar to go home.   Goal of Therapy:  INR 2-3 Monitor platelets by anticoagulation protocol: Yes   Plan:  - No warfarin today - Will f/up daily PT/INR and start lovenox when INR < 2.5.   Thank you for allowing pharmacy to be a part of this patients care team.  Lovenia Kim Pharm.D.,  BCPS Clinical Pharmacist 10/22/2012 12:12 PM Pager: [phone] Phone: [phone]

## 2012-10-22 NOTE — Progress Notes (Signed)
Subjective: Abigail Oliver is admitted for near-syncope and weakness. See HPI in H&P. Findings to date include hypotension that did respond to bolus fluids and new on-set anemia without GI source, e.g. Heme negative stools, no hemetemesis. She does admit to having early satiety, reduced PO intake, difficulty with swallow and uncontrolled heartburn. She reports that her CBGs have been poorly controlled of late. She denies any chest pain, change in bowel habit, shortness of  Breath. She did have a colonoscopy in '12 - Dr. Loreta Ave, thus report not in [facility] and has been sent a recall card for repeat colonoscopy due to family history but denies having any polyps at last exam.   Objective: Lab:  Recent Labs  10/21/12 1005 10/22/12 0220  WBC 4.5 5.4  NEUTROABS 2.3  --   HGB 10.8* 11.2*  HCT 33.5* 34.9*  MCV 88.2 87.9  PLT 122* 131*    Recent Labs  10/21/12 1005 10/22/12 0220  NA 138 135  K 4.1 4.2  CL 101 100  GLUCOSE 229* 298*  BUN 21 21  CREATININE 0.78 0.67  CALCIUM 9.7 9.2   A1C - 6.4% TSH 1.5 Imaging: CT abd/pelvis 10/22/12: IMPRESSION:  No aneurysmal dilatation of the aorta. Mild scattered  atherosclerotic disease.  Hepatic steatosis and questionable mild cirrhotic change. Correlate  with LFTs and risk factors.  No acute abdominopelvic process.  Scheduled Meds: . aspirin EC  81 mg Oral Daily  . atorvastatin  20 mg Oral q1800  . calcium-vitamin D  1 tablet Oral Daily  . citalopram  20 mg Oral Daily  . folic acid  1 mg Oral Daily  . insulin aspart  0-15 Units Subcutaneous TID WC  . insulin aspart  0-5 Units Subcutaneous QHS  . latanoprost  1 drop Both Eyes QHS  . metFORMIN  500 mg Oral BID WC  . sodium chloride  3 mL Intravenous Q12H  . vitamin C  500 mg Oral Daily  . warfarin  3 mg Oral q1800  . Warfarin - Pharmacist Dosing Inpatient   Does not apply q1800   Continuous Infusions: . sodium chloride 1,000 mL (10/22/12 0434)   PRN Meds:.acetaminophen, acetaminophen,  bisacodyl, cyclobenzaprine, ondansetron (ZOFRAN) IV, ondansetron, traMADol, zolpidem   Physical Exam: Filed Vitals:   10/22/12 0426  BP: 140/68  Pulse: 83  Temp: 98.4 F (36.9 C)  Resp: 18   gen'l - overweight white woman in no distress but feeling fatgued (no sleep for 30- hrs) HEENT - C&S w/o icterus Cor - 2+ radial, RRR Pulm - clear to A&P Abdomen - BS+, no guarding Neuro - alert and oriented.     Assessment/Plan: 1. GI - patient with new on-set anemia, heme negative stools but multiple upper GI complaints including early satiety, dysphagia and uncontrolled GERD, abdominal discomfort raising the possibility of an UGI source of her anemia  Plan F/u H/H  Hold coumadin  GI consult - may need EGD  Clear liquids only  High dose PPI therapy  2. DM - patient reports elevated CBGs over the past several weeks and does have an appointment with Dr. Sharl Ma for late July. Her A1C is 6.4% indicating overall good control.  Plan Continue Metformin  Continue sliding scale  3. Hypotension - BP stable on IV fluids  4. CAD - cardiac enzymes negative x 3 and no Cardiac symptoms.     Illene Regulus  IM (o) [phone]; (c) [phone] Call-grp - [facility] IM  Tele: [phone]  10/22/2012, 10:50 AM

## 2012-10-22 NOTE — Consult Note (Signed)
Referring Provider: Dr. Debby Bud Primary Care Physician:  Illene Regulus, MD Primary Gastroenterologist:  Dr.Mann  Reason for Consultation:  Anemia, hypotension, dysphagia early satiety  HPI: Abigail Oliver is a 67 y.o. female known to Dr. Charna Elizabeth from prior endoscopic evaluation. Patient has multiple medical problems including coronary artery disease, aortic valve disease status post aortic valve replacement and maintained on chronic Coumadin .She has hyperlipidemia peripheral arterial disease, diabetes, depression and glaucoma. She also has chronic low back pain with lumbar disc disease . She was admitted to the hospital yesterday with complaints of dizziness and lightheadedness with a presyncopal sensation. She was found to be hypotensive in the emergency room with blood pressure 84/49. Fortunately she did respond to IV fluids and has not had any recurrence of hypotension. Troponins and d-dimer were negative. EKG showed no acute change. She was also noted to be anemic with hemoglobin of 10.8 but Hemoccult-negative. She underwent CT scan of the abdomen and pelvis with contrast early this morning to rule out intra-abdominal bleed or abdominal aortic aneurysm with leak and this was negative with the exception of hepatic steatosis with question of early cirrhosis. Patient has had prior colonoscopies with Dr. Loreta Ave. Colon in 2003 for screening was negative. Patient says she had a colonoscopy in 2009 as an outpatient, may have had one small polyp and was asked to followup in 5 years. She says she got a letter earlier in the year but has not had followup colonoscopy as yet. She has not had prior EGD. She says she has been having problems for several months with left ear pain which is sometimes sharp and stabbing .She gets pain down below her ear as well and when this happens she usually begins having dizziness . She is on blood pressure meds at home and also on diuretics. She has had problems with  early satiety over the past 4-5 months at least and says she eats about a half as much as she used to gets full very quickly. She has also been having frequent nausea but no vomiting appetite is fair she has not lost any weight. She does have some heartburn and reflux-type symptoms and has been using Pepcid AC. She also complains of solid food dysphagia. She says she has to drink a lot of fluid to get her food to go to down, does not have any problems with regurgitation . She has frequent episodes of urgency postprandially with diarrhea and has had this for years. She has not noted any change in that pattern and has not noted any melena or hematochezia. She takes a baby aspirin every day in addition to her Coumadin and denies any NSAID use. Family history is positive for colon cancer in her mother diagnosed at age 64. Patient also mentions that she has been told that she's had elevated liver tests in the past but has never had a clear answer for this.   Past Medical History  Diagnosis Date  . Coronary atherosclerosis of native coronary artery   . Hyperlipidemia   . Chronic angle-closure glaucoma(365.23)   . Aortic valve disorders   . Peripheral vascular disease   . Type II diabetes mellitus   . Depression     Past Surgical History  Procedure Laterality Date  . Carpal tunnel release  1989    bilaterally  . Cholecystectomy  1990  . Peripheral arterial stent graft  09/20/11    right SFA  . Cardiac valve replacement  2000    aortic valve   .  Laceration repair      right hand  . Neuroplasty / transposition ulnar nerve at elbow      right  . Tonsillectomy  1968  . Vaginal hysterectomy  1985    Fibroids  . Bilateral oophorectomy  1987  . Cesarean section  1969; 1971  . Breast biopsy  2012    left  . Lymph node biopsy  06/2011    "core needle on 5"  . Cataract extraction w/ intraocular lens  implant, bilateral  ~ 2007  . Refractive surgery  ` 2004    "for glaucoma"  . Needle biopsy   2009    "on ankles for nerve damage"    Prior to Admission medications   Medication Sig Start Date End Date Taking? Authorizing Provider  Ascorbic Acid (VITAMIN C) 500 MG tablet Take 500 mg by mouth daily.     Yes Historical Provider, MD  aspirin EC 81 MG tablet Take 81 mg by mouth daily.   Yes Historical Provider, MD  calcium-vitamin D (CALCIUM 500+D) 500-200 MG-UNIT per tablet Take 1 tablet by mouth daily.   Yes Historical Provider, MD  citalopram (CELEXA) 20 MG tablet Take 1 tablet (20 mg total) by mouth daily. 09/18/12  Yes Jacques Navy, MD  cyclobenzaprine (FLEXERIL) 10 MG tablet Take 10 mg by mouth 3 (three) times daily as needed for muscle spasms.   Yes Historical Provider, MD  folic acid (FOLVITE) 1 MG tablet Take 1 mg by mouth daily.    Yes Historical Provider, MD  latanoprost (XALATAN) 0.005 % ophthalmic solution Place 1 drop into both eyes at bedtime.   Yes Historical Provider, MD  losartan (COZAAR) 25 MG tablet Take 1 by mouth daily 08/27/12  Yes Historical Provider, MD  metFORMIN (GLUCOPHAGE) 500 MG tablet Take 1 tablet (500 mg total) by mouth 2 (two) times daily with a meal. 02/10/12  Yes Jacques Navy, MD  metoprolol succinate (TOPROL-XL) 50 MG 24 hr tablet Take 50 mg by mouth 2 (two) times daily. Take with or immediately following a meal.   Yes Historical Provider, MD  potassium chloride (K-DUR,KLOR-CON) 10 MEQ tablet Take 1 tablet (10 mEq total) by mouth 2 (two) times daily. 03/28/12  Yes Jacques Navy, MD  rosuvastatin (CRESTOR) 10 MG tablet Take 10 mg by mouth at bedtime.   Yes Historical Provider, MD  torsemide (DEMADEX) 20 MG tablet Take 20 mg by mouth daily. 09/09/10  Yes Jacques Navy, MD  traMADol (ULTRAM) 50 MG tablet Take 1 tablet (50 mg total) by mouth every 8 (eight) hours as needed for pain. 09/11/12  Yes Newt Lukes, MD  warfarin (COUMADIN) 3 MG tablet Take 3 mg by mouth daily.   Yes Historical Provider, MD  zolpidem (AMBIEN) 10 MG tablet Take 5 mg by  mouth at bedtime as needed. For sleep 02/10/12  Yes Jacques Navy, MD  glucose blood (ONE TOUCH ULTRA TEST) test strip Use as instructed once daily to test blood glucose. Dx: 250.0 10/19/11 10/18/12  Jacques Navy, MD  Lancets [facility] ULTRASOFT) lancets Use as instructed once daily to test blood glucose. Dx: 250.0 08/18/11 08/17/12  Jacques Navy, MD    Current Facility-Administered Medications  Medication Dose Route Frequency Provider Last Rate Last Dose  . 0.9 %  sodium chloride infusion   Intravenous Continuous Kela Millin, MD 125 mL/hr at 10/22/12 0434 1,000 mL at 10/22/12 0434  . acetaminophen (TYLENOL) tablet 650 mg  650 mg Oral Q6H  PRN Kela Millin, MD       Or  . acetaminophen (TYLENOL) suppository 650 mg  650 mg Rectal Q6H PRN Adeline C Viyuoh, MD      . atorvastatin (LIPITOR) tablet 20 mg  20 mg Oral q1800 Kela Millin, MD   20 mg at 10/21/12 1732  . bisacodyl (DULCOLAX) EC tablet 5 mg  5 mg Oral Daily PRN Kela Millin, MD      . calcium-vitamin D (OSCAL WITH D) 500-200 MG-UNIT per tablet 1 tablet  1 tablet Oral Daily Kela Millin, MD   1 tablet at 10/22/12 1125  . citalopram (CELEXA) tablet 20 mg  20 mg Oral Daily Adeline C Viyuoh, MD   20 mg at 10/22/12 1125  . cyclobenzaprine (FLEXERIL) tablet 10 mg  10 mg Oral TID PRN Kela Millin, MD      . folic acid (FOLVITE) tablet 1 mg  1 mg Oral Daily Adeline C Viyuoh, MD   1 mg at 10/21/12 1742  . insulin aspart (novoLOG) injection 0-15 Units  0-15 Units Subcutaneous TID WC Kela Millin, MD   5 Units at 10/22/12 0637  . insulin aspart (novoLOG) injection 0-5 Units  0-5 Units Subcutaneous QHS Kela Millin, MD   3 Units at 10/21/12 2219  . latanoprost (XALATAN) 0.005 % ophthalmic solution 1 drop  1 drop Both Eyes QHS Kela Millin, MD   1 drop at 10/21/12 2220  . metFORMIN (GLUCOPHAGE) tablet 500 mg  500 mg Oral BID WC Kela Millin, MD   500 mg at 10/22/12 0636  . ondansetron (ZOFRAN) tablet 4 mg   4 mg Oral Q6H PRN Adeline C Viyuoh, MD       Or  . ondansetron (ZOFRAN) injection 4 mg  4 mg Intravenous Q6H PRN Adeline C Viyuoh, MD      . pantoprazole (PROTONIX) EC tablet 40 mg  40 mg Oral BID Jacques Navy, MD      . sodium chloride 0.9 % injection 3 mL  3 mL Intravenous Q12H Kela Millin, MD   3 mL at 10/21/12 2220  . traMADol (ULTRAM) tablet 50 mg  50 mg Oral Q8H PRN Kela Millin, MD   50 mg at 10/22/12 0755  . vitamin C (ASCORBIC ACID) tablet 500 mg  500 mg Oral Daily Kela Millin, MD   500 mg at 10/21/12 1741  . Warfarin - Pharmacist Dosing Inpatient   Does not apply q1800 Christella Hartigan, RPH      . zolpidem (AMBIEN) tablet 5 mg  5 mg Oral QHS PRN Kela Millin, MD        Allergies as of 10/21/2012 - Review Complete 10/21/2012  Allergen Reaction Noted  . Iodinated diagnostic agents Rash 09/29/2011    Family History  Problem Relation Age of Onset  . Cancer Mother     Colon  . COPD Mother   . Emphysema Mother   . Diabetes Neg Hx     History   Social History  . Marital Status: Married    Spouse Name: N/A    Number of Children: N/A  . Years of Education: N/A   Occupational History  . Not on file.   Social History Main Topics  . Smoking status: Never Smoker   . Smokeless tobacco: Never Used  . Alcohol Use: No  . Drug Use: No  . Sexually Active: No   Other Topics Concern  . Not on  file   Social History Narrative   Pt is a high school graduate with 2 years of college. Married in 1967 she has 1 son born 51 and 1 daughter born 42 and 1 grandchild. 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    Review of Systems: Pertinent positive and negative review of systems were noted in the above HPI section.  All other review of systems was otherwise negative.Marland Kitchen  Physical Exam: Vital signs in last 24 hours: Temp:  [97.9 F (36.6 C)-98.4 F (36.9 C)] 98.4 F (36.9 C) (06/01 0426) Pulse Rate:  [57-86] 83 (06/01 0426) Resp:   [16-20] 18 (06/01 0426) BP: (104-140)/(34-86) 140/68 mmHg (06/01 0426) SpO2:  [92 %-100 %] 97 % (06/01 0426) Weight:  [198 lb 13.7 oz (90.2 kg)] 198 lb 13.7 oz (90.2 kg) (05/31 1326) Last BM Date: 10/20/12 General:   Alert,  Well-developed, well-nourished, pleasant and cooperative in NAD Head:  Normocephalic and atraumatic. Eyes:  Sclera clear, no icterus.   Conjunctiva pink. Ears:  Normal auditory acuity. Nose:  No deformity, discharge,  or lesions. Mouth:  No deformity or lesions.   Neck:  Supple; no masses or thyromegaly. Lungs:  Clear throughout to auscultation.   No wheezes, crackles, or rhonchi. Heart:  Regular rate and rhythm; no murmurs, clicks, rubs,  or gallops. Abdomen:  Soft,nontender, BS active,nonpalp mass or hsm.   Rectal:  Deferred  Msk:  Symmetrical without gross deformities. . Pulses:  Normal pulses noted. Extremities:  Without clubbing or edema. Neurologic:  Alert and  oriented x4;  grossly normal neurologically. Skin:  Intact without significant lesions or rashes.. Psych:  Alert and cooperative. Normal mood and affect.  Intake/Output from previous day: 05/31 0701 - 06/01 0700 In: 1620 [P.O.:120; I.V.:1500] Out: -  Intake/Output this shift:    Lab Results:  Recent Labs  10/21/12 1005 10/22/12 0220  WBC 4.5 5.4  HGB 10.8* 11.2*  HCT 33.5* 34.9*  PLT 122* 131*   BMET  Recent Labs  10/21/12 1005 10/22/12 0220  NA 138 135  K 4.1 4.2  CL 101 100  CO2 25 21  GLUCOSE 229* 298*  BUN 21 21  CREATININE 0.78 0.67  CALCIUM 9.7 9.2   LFT No results found for this basename: PROT, ALBUMIN, AST, ALT, ALKPHOS, BILITOT, BILIDIR, IBILI,  in the last 72 hours PT/INR  Recent Labs  10/21/12 1005 10/22/12 0220  LABPROT 28.5* 28.7*  INR 2.86* 2.88*   Hepatitis Panel No results found for this basename: HEPBSAG, HCVAB, HEPAIGM, HEPBIGM,  in the last 72 hours    Studies/Results: Dg Chest 2 View  10/21/2012   *RADIOLOGY REPORT*  Clinical Data: Dizziness  and weakness  CHEST - 2 VIEW  Comparison: 02/07/2012  Findings: Changes from cardiac surgery and valve replacement are stable.  The cardiac silhouette is mildly enlarged.  No mediastinal or hilar masses.  The lungs are clear.  No pleural effusion or pneumothorax.  The bony thorax is demineralized but intact.  IMPRESSION: No acute cardiopulmonary disease.   Original Report Authenticated By: Amie Portland, M.D.   Ct Abdomen Pelvis W Contrast  10/22/2012   *RADIOLOGY REPORT*  Clinical Data: Dizziness, weakness.  CT ABDOMEN AND PELVIS WITH CONTRAST  Technique:  Multidetector CT imaging of the abdomen and pelvis was performed following the standard protocol during bolus administration of intravenous contrast.  Contrast: OMNIPAQUE IOHEXOL 300 MG/ML  SOLN  Comparison: 05/01/2010  Findings: Tiny nodular opacity periphery right middle lobe  is unchanged and therefore nonaggressive.  The lung bases otherwise predominately clear.  Normal heart size.  Coronary artery calcification.  Hepatic steatosis and mildly lobular contour.  Cholecystectomy clips. No biliary ductal dilatation.  Unremarkable spleen, pancreas, adrenal glands, kidneys.  No hydronephrosis or hydroureter.  No CT evidence for colitis.  Small bowel loops are normal in course and caliber.  Tiny hiatal hernia.  Appendix not visualized.  No right lower quadrant inflammation.  No free intraperitoneal air or fluid.  No lymphadenopathy.  Thin-walled bladder.  Absent uterus.  No adnexal mass.  There is scattered atherosclerotic calcification of the aorta and its branches. No aneurysmal dilatation.  L2 vertebral body lesion is likely a hemangioma.  No acute osseous finding.  Mild multilevel degenerative changes.  IMPRESSION: No aneurysmal dilatation of the aorta.  Mild scattered atherosclerotic disease.  Hepatic steatosis and questionable mild cirrhotic change. Correlate with LFTs and risk factors.  No acute abdominopelvic process.   Original Report Authenticated  By: Jearld Lesch, M.D.    IMPRESSION:  #11 67 year old female admitted yesterday with dizziness lightheadedness and hypotension. I do not think her anemia was responsible for these symptoms She has history of intermittent left ear pain associated with dizziness. She may have been relatively volume depleted with diuretic use-and responded to volume replacement. #2 normocytic anemia-Hemoccult negative #3 chronic anticoagulation with Coumadin #4 status post aortic valve replacement #5 coronary artery disease #6 diabetes mellitus #7 early satiety, nausea-rule out component of gastroparesis #8 solid food dysphagia and chronic reflux-rule out esophageal stricture versus motility disorder #9 question early cirrhosis on CT-etiology unclear-patient does have history of mildly elevated LFTs-will rule out hep C, also consider underlying fatty liver disease/NASH  PLAN: #1 check anemia panel #2 patient will need colonoscopy and upper endoscopy with possible esophageal dilation-these can be done inpatient and will need to be scheduled with Dr. Loreta Ave. Patient needs to come off of her Coumadin and have INR normalize prior to procedures. She will need Lovenox bridge. Patient is very anxious to be discharged from the hospital due to her work schedule-will defer to Dr. Loreta Ave whether her procedure could be scheduled outpatient later in the week with home Lovenox bridge. #3 start PPI #4 check hep C antibody/chronic hepatitis panel   Amy Esterwood  10/22/2012, 11:37 AM     ________________________________________________________________________  [facility] MD note:  I personally examined the patient, reviewed the data and agree with the assessment and plan described above.  Mild anemia, chronic UGI symptoms (early satiety without weight loss, intermittent solid food dysphagia) as well as FH + for colon cancer. EGD and colonoscopy both are indicated.  These could be done as an outpatient, Drs. Mann, Norins  to decide on timing but she will need probably 3-4 more days for INR to decrease unless it is decided to reverse her with Vit K.     Rob Bunting, MD [facility]astroenter[facility] Pager [phone]

## 2012-10-23 ENCOUNTER — Ambulatory Visit: Payer: Medicare Other | Admitting: Pharmacist Clinician (PhC)/ Clinical Pharmacy Specialist

## 2012-10-23 ENCOUNTER — Inpatient Hospital Stay (HOSPITAL_COMMUNITY): Payer: Medicare Other

## 2012-10-23 DIAGNOSIS — I509 Heart failure, unspecified: Secondary | ICD-10-CM | POA: Diagnosis not present

## 2012-10-23 DIAGNOSIS — I2510 Atherosclerotic heart disease of native coronary artery without angina pectoris: Secondary | ICD-10-CM | POA: Diagnosis not present

## 2012-10-23 DIAGNOSIS — Z7901 Long term (current) use of anticoagulants: Secondary | ICD-10-CM | POA: Diagnosis not present

## 2012-10-23 DIAGNOSIS — I5031 Acute diastolic (congestive) heart failure: Secondary | ICD-10-CM

## 2012-10-23 DIAGNOSIS — D509 Iron deficiency anemia, unspecified: Secondary | ICD-10-CM | POA: Diagnosis not present

## 2012-10-23 DIAGNOSIS — R1319 Other dysphagia: Secondary | ICD-10-CM | POA: Diagnosis not present

## 2012-10-23 DIAGNOSIS — I519 Heart disease, unspecified: Secondary | ICD-10-CM

## 2012-10-23 DIAGNOSIS — Z800 Family history of malignant neoplasm of digestive organs: Secondary | ICD-10-CM | POA: Diagnosis not present

## 2012-10-23 DIAGNOSIS — R42 Dizziness and giddiness: Secondary | ICD-10-CM | POA: Diagnosis not present

## 2012-10-23 LAB — PROTIME-INR
INR: 3.55 — ABNORMAL HIGH (ref 0.00–1.49)
Prothrombin Time: 33.5 seconds — ABNORMAL HIGH (ref 11.6–15.2)

## 2012-10-23 LAB — HEMOGLOBIN AND HEMATOCRIT, BLOOD
HCT: 32.6 % — ABNORMAL LOW (ref 36.0–46.0)
Hemoglobin: 10.2 g/dL — ABNORMAL LOW (ref 12.0–15.0)

## 2012-10-23 LAB — GLUCOSE, CAPILLARY
Glucose-Capillary: 89 mg/dL (ref 70–99)
Glucose-Capillary: 117 mg/dL — ABNORMAL HIGH (ref 70–99)
Glucose-Capillary: 214 mg/dL — ABNORMAL HIGH (ref 70–99)
Glucose-Capillary: 104 mg/dL — ABNORMAL HIGH (ref 70–99)

## 2012-10-23 LAB — HEPATITIS B SURFACE ANTIBODY,QUALITATIVE: Hep B S Ab: NONREACTIVE

## 2012-10-23 LAB — PRO B NATRIURETIC PEPTIDE: Pro B Natriuretic peptide (BNP): 2324 pg/mL — ABNORMAL HIGH (ref 0–125)

## 2012-10-23 MED ORDER — ALBUTEROL SULFATE (5 MG/ML) 0.5% IN NEBU
2.5000 mg | INHALATION_SOLUTION | RESPIRATORY_TRACT | Status: DC | PRN
  Administered 2012-10-23: 2.5 mg via RESPIRATORY_TRACT
  Filled 2012-10-23: qty 0.5

## 2012-10-23 MED ORDER — FUROSEMIDE 10 MG/ML IJ SOLN
40.0000 mg | Freq: Four times a day (QID) | INTRAMUSCULAR | Status: AC
  Administered 2012-10-23 – 2012-10-24 (×3): 40 mg via INTRAVENOUS
  Filled 2012-10-23 (×3): qty 4

## 2012-10-23 MED ORDER — LOSARTAN POTASSIUM 25 MG PO TABS
25.0000 mg | ORAL_TABLET | Freq: Every day | ORAL | Status: DC
  Administered 2012-10-23 – 2012-10-24 (×2): 25 mg via ORAL
  Filled 2012-10-23 (×2): qty 1

## 2012-10-23 MED ORDER — METOPROLOL SUCCINATE ER 50 MG PO TB24
50.0000 mg | ORAL_TABLET | Freq: Two times a day (BID) | ORAL | Status: DC
  Administered 2012-10-23 – 2012-10-24 (×3): 50 mg via ORAL
  Filled 2012-10-23 (×4): qty 1

## 2012-10-23 NOTE — Progress Notes (Signed)
ANTICOAGULATION CONSULT NOTE - Follow Up Consult  Pharmacy Consult for Lovenox when INR <2.5; Coumadin on hold Indication: mechanical AVR  Allergies  Allergen Reactions  . Iodinated Diagnostic Agents Rash     Red rash after cardiac cath 1 wk ago, ? Contrast allergy, requires 13 hr prep now per dr.[email or web address]//a.calhoun    Patient Measurements: Height: 5\' 2"  (157.5 cm) Weight: 198 lb 13.7 oz (90.2 kg) IBW/kg (Calculated) : 50.1  Vital Signs: Temp: 98.9 F (37.2 C) (06/02 1400) Temp src: Oral (06/02 1400) BP: 156/76 mmHg (06/02 1400) Pulse Rate: 105 (06/02 1400)  Labs:  Recent Labs  10/21/12 1005 10/21/12 1540 10/21/12 2052 10/22/12 0220 10/23/12 0527  HGB 10.8*  --   --  11.2* 10.2*  HCT 33.5*  --   --  34.9* 32.6*  PLT 122*  --   --  131*  --   APTT 49*  --   --   --   --   LABPROT 28.5*  --   --  28.7* 33.5*  INR 2.86*  --   --  2.88* 3.55*  CREATININE 0.78  --   --  0.67  --   TROPONINI  --  <0.30 <0.30 <0.30  --     Estimated Creatinine Clearance: 71.2 ml/min (by C-G formula based on Cr of 0.67).  Assessment:  Coumadin on hold for GI procedures but INR up today.  Home Coumadin dose 3 mg daily. Last given 5/31.  Decreased PO intake could be contributing to rise in INR.   Goal of Therapy:  INR 2.5-3.5 Monitor platelets by anticoagulation protocol: Yes   Plan:   No Coumadin again today.  No Lovenox needed yet. Dose planned is 90 mg SQ q12hrs.  Daily PT/INR.  Changed H/H to CBC to follow up low platelet count.  Dennie Fetters, [place] Pager: [phone] 10/23/2012,4:54 PM

## 2012-10-23 NOTE — Progress Notes (Signed)
Attempted to reach Dr Debby Bud, left message with his CMA answering machine. Pt has had SOBOE, or SOB when coughing or if lays back in bed. Pt c/o upper chest discomfort with deep breathing. RT has given prn neb earlier today as ordered. Pt O2 sat this am on RA was 93% but placed on 1L/[place] for SOB.   (note INR 3.55 this am).  CXR this am, results of CHF.  Pt has had NS infusing at 125cc/hr, family had cut pump off due to beeping, left off at this time until speak with Dr Debby Bud about pt xray results. Pt also concerned about some of home meds not currently being given, although she understands why they were stopped when she first came in with hypotension. Await return call from MD.

## 2012-10-23 NOTE — Progress Notes (Signed)
Subjective: Since I last evaluated the patient, she has developed early satiety and solid food dysphagia with no history of weight loss. She also has a history of fatty liver and scans on this admission indicate she may have early cirrhotic changes. She is considerably short of breath and has been having a lot of wheezing. She does not smoke and has never smoked but is around several smokers-husband and children, mother. She denies having any melena or hematochezia and her BM's tend to alternate between diarrhea and constipation. She is on chronic anticoagulation for a [employer] valve. Her Coumadin has been on hold since admission but her INR is at 3.55 today.   Objective: Vital signs in last 24 hours: Temp:  [98.4 F (36.9 C)-98.9 F (37.2 C)] 98.9 F (37.2 C) (06/02 1400) Pulse Rate:  [81-105] 105 (06/02 1400) Resp:  [19-22] 22 (06/02 1400) BP: (111-156)/(56-79) 156/76 mmHg (06/02 1400) SpO2:  [88 %-99 %] 93 % (06/02 1400) Last BM Date: 09/20/12  Intake/Output from previous day: 06/01 0701 - 06/02 0700 In: 3037.5 [I.V.:3037.5] Out: 700 [Urine:700] Intake/Output this shift: Total I/O In: 991.3 [P.O.:360; I.V.:631.3] Out: 3901 [Urine:3900; Stool:1]  General appearance: alert, cooperative, appears older than stated age, no distress, morbidly obese and pale Resp: wheezes bilaterally Cardio: systolic murmur present S1 and S2 are regular. GI: soft, morbidly obese, non-tender; bowel sounds normal; no masses,  no organomegaly Extremities: extremities normal, atraumatic, no cyanosis or edema  Lab Results:  Recent Labs  10/21/12 1005 10/22/12 0220 10/23/12 0527  WBC 4.5 5.4  --   HGB 10.8* 11.2* 10.2*  HCT 33.5* 34.9* 32.6*  PLT 122* 131*  --    BMET  Recent Labs  10/21/12 1005 10/22/12 0220  NA 138 135  K 4.1 4.2  CL 101 100  CO2 25 21  GLUCOSE 229* 298*  BUN 21 21  CREATININE 0.78 0.67  CALCIUM 9.7 9.2   LFT No results found for this basename: PROT, ALBUMIN, AST,  ALT, ALKPHOS, BILITOT, BILIDIR, IBILI,  in the last 72 hours PT/INR  Recent Labs  10/22/12 0220 10/23/12 0527  LABPROT 28.7* 33.5*  INR 2.88* 3.55*   Hepatitis Panel  Recent Labs  10/22/12 1355  HEPBSAG NEGATIVE  HCVAB NEGATIVE   Studies/Results: Dg Chest 2 View  10/23/2012   *RADIOLOGY REPORT*  Clinical Data: Shortness of breath.  CHEST - 2 VIEW  Comparison: 10/21/2012.  Findings: Trachea is midline.  Heart size stable.  Mild diffuse interstitial prominence and indistinctness, new.  Probable tiny bilateral pleural effusions. Biapical pleural thickening.  IMPRESSION: Congestive heart failure.   Original Report Authenticated By: Leanna Battles, M.D.   Ct Abdomen Pelvis W Contrast  10/22/2012   *RADIOLOGY REPORT*  Clinical Data: Dizziness, weakness.  CT ABDOMEN AND PELVIS WITH CONTRAST  Technique:  Multidetector CT imaging of the abdomen and pelvis was performed following the standard protocol during bolus administration of intravenous contrast.  Contrast: OMNIPAQUE IOHEXOL 300 MG/ML  SOLN  Comparison: 05/01/2010  Findings: Tiny nodular opacity periphery right middle lobe is unchanged and therefore nonaggressive.  The lung bases otherwise predominately clear.  Normal heart size.  Coronary artery calcification.  Hepatic steatosis and mildly lobular contour.  Cholecystectomy clips. No biliary ductal dilatation.  Unremarkable spleen, pancreas, adrenal glands, kidneys.  No hydronephrosis or hydroureter.  No CT evidence for colitis.  Small bowel loops are normal in course and caliber.  Tiny hiatal hernia.  Appendix not visualized.  No right lower quadrant inflammation.  No free  intraperitoneal air or fluid.  No lymphadenopathy.  Thin-walled bladder.  Absent uterus.  No adnexal mass.  There is scattered atherosclerotic calcification of the aorta and its branches. No aneurysmal dilatation.  L2 vertebral body lesion is likely a hemangioma.  No acute osseous finding.  Mild multilevel degenerative  changes.  IMPRESSION: No aneurysmal dilatation of the aorta.  Mild scattered atherosclerotic disease.  Hepatic steatosis and questionable mild cirrhotic change. Correlate with LFTs and risk factors.  No acute abdominopelvic process.   Original Report Authenticated By: Jearld Lesch, M.D.   Medications: I have reviewed the patient's current medications.  Assessment/Plan: 1) Solid food dysphagia/early satiety: I feel a barium swallow might be a safe test at the present time. An EGD can be done later once she is on a Lovenox bridge. 2) Family history of colon cancer: plans are to schedule this as an OP once her acute pulmonary symptoms improve. 3) Fatty liver with thrombocytopenia-may very well indicate early cirrhosis.   LOS: 2 days   Zander Ingham 10/23/2012, 6:35 PM

## 2012-10-23 NOTE — Care Management Note (Unsigned)
    Page 1 of 1   10/23/2012     4:40:31 PM   CARE MANAGEMENT NOTE 10/23/2012  Patient:  Abigail Oliver, Abigail Oliver   Account Number:  [id number]  Date Initiated:  10/23/2012  Documentation initiated by:  Phillp Dolores  Subjective/Objective Assessment:   PT ADM ON 10/21/12 WITH PRESYNCOPE, DIZZINESS.  PTA, PT LIVES AT HOME WITH SPOUSE AND IS INDEPENDENT.     Action/Plan:   WILL FOLLOW FOR HOME NEEDS AS PT PROGRESSES.   Anticipated DC Date:  10/24/2012   Anticipated DC Plan:  HOME/SELF CARE      DC Planning Services  CM consult      Choice offered to / List presented to:             Status of service:  In process, will continue to follow Medicare Important Message given?   (If response is "NO", the following Medicare IM given date fields will be blank) Date Medicare IM given:   Date Additional Medicare IM given:    Discharge Disposition:    Per UR Regulation:  Reviewed for med. necessity/level of care/duration of stay  If discussed at Long Length of Stay Meetings, dates discussed:    Comments:

## 2012-10-23 NOTE — Progress Notes (Signed)
Subjective: Abigail Oliver reports acute shortness of breath with wheezing. This happens frequently at work. She has been told she has COPD. She is not in distress but is uncomfortable  Objective: Lab:  Recent Labs  10/21/12 1005 10/22/12 0220 10/23/12 0527  WBC 4.5 5.4  --   NEUTROABS 2.3  --   --   HGB 10.8* 11.2* 10.2*  HCT 33.5* 34.9* 32.6*  MCV 88.2 87.9  --   PLT 122* 131*  --     Recent Labs  10/21/12 1005 10/22/12 0220  NA 138 135  K 4.1 4.2  CL 101 100  GLUCOSE 229* 298*  BUN 21 21  CREATININE 0.78 0.67  CALCIUM 9.7 9.2    Imaging:  Scheduled Meds: . atorvastatin  20 mg Oral q1800  . calcium-vitamin D  1 tablet Oral Daily  . citalopram  20 mg Oral Daily  . folic acid  1 mg Oral Daily  . insulin aspart  0-15 Units Subcutaneous TID WC  . insulin aspart  0-5 Units Subcutaneous QHS  . latanoprost  1 drop Both Eyes QHS  . metFORMIN  500 mg Oral BID WC  . pantoprazole  40 mg Oral BID  . sodium chloride  3 mL Intravenous Q12H  . vitamin C  500 mg Oral Daily  . Warfarin - Pharmacist Dosing Inpatient   Does not apply q1800   Continuous Infusions: . sodium chloride 125 mL/hr at 10/23/12 0657   PRN Meds:.acetaminophen, acetaminophen, bisacodyl, cyclobenzaprine, ondansetron (ZOFRAN) IV, ondansetron, traMADol, zolpidem   Physical Exam: Filed Vitals:   10/23/12 0428  BP: 111/79  Pulse: 81  Temp: 98.4 F (36.9 C)  Resp: 20   gen'l- overweight white woman in no acute distress HEENT- C&S clear Cor- 2+ radial pulse Pulm - no increased WOB, no wheezing Neuro - A&O x 3     Assessment/Plan: 1. GI - appreciate GI consult. Will await recommendations from Dr. Loreta Oliver. IF endoscopies will be delayed will discharge on lovenox.  2. DM - stable  3. HTN - stable  5. Pulm - recurrent SOB Plan 2 v CXR now  Albuterol neb q 6 prn   Abigail Oliver IM (o) [phone]; (c) [phone] Call-grp - Patsi Sears IM  Tele: [phone]  10/23/2012, 7:24 AM

## 2012-10-23 NOTE — Progress Notes (Signed)
Subjective: Breathing better after diuresis. NO sign of GI blood loss  Objective: Lab:  Recent Labs  10/21/12 1005 10/22/12 0220 10/23/12 0527  WBC 4.5 5.4  --   NEUTROABS 2.3  --   --   HGB 10.8* 11.2* 10.2*  HCT 33.5* 34.9* 32.6*  MCV 88.2 87.9  --   PLT 122* 131*  --     Recent Labs  10/21/12 1005 10/22/12 0220  NA 138 135  K 4.1 4.2  CL 101 100  GLUCOSE 229* 298*  BUN 21 21  CREATININE 0.78 0.67  CALCIUM 9.7 9.2   BNP 2324  Imaging: cxr 2V:CHEST - 2 VIEW  Comparison: 10/21/2012.  Findings: Trachea is midline. Heart size stable. Mild diffuse  interstitial prominence and indistinctness, new. Probable tiny  bilateral pleural effusions. Biapical pleural thickening.  IMPRESSION:  Congestive heart failure.  Scheduled Meds: . atorvastatin  20 mg Oral q1800  . calcium-vitamin D  1 tablet Oral Daily  . citalopram  20 mg Oral Daily  . folic acid  1 mg Oral Daily  . furosemide  40 mg Intravenous Q6H  . insulin aspart  0-15 Units Subcutaneous TID WC  . insulin aspart  0-5 Units Subcutaneous QHS  . latanoprost  1 drop Both Eyes QHS  . losartan  25 mg Oral Daily  . metFORMIN  500 mg Oral BID WC  . metoprolol succinate  50 mg Oral BID  . pantoprazole  40 mg Oral BID  . sodium chloride  3 mL Intravenous Q12H  . vitamin C  500 mg Oral Daily  . Warfarin - Pharmacist Dosing Inpatient   Does not apply q1800   Continuous Infusions: . sodium chloride 20 mL/hr (10/23/12 1515)   PRN Meds:.acetaminophen, acetaminophen, albuterol, bisacodyl, cyclobenzaprine, ondansetron (ZOFRAN) IV, ondansetron, traMADol, zolpidem   Physical Exam: Filed Vitals:   10/23/12 1400  BP: 156/76  Pulse: 105  Temp: 98.9 F (37.2 C)  Resp: 22  2.9 liters  Pulm - no increased WOB     Assessment/Plan: 1. GI - Dr Loreta Ave in to see patient. Per patient she will have BaS in AM. Endoscopies as outpatient. Iron studies with low normal iron, low iron sat @ 14%  Plan  Per Dr. Loreta Ave  Retic  count    5. Pulm/Card - cxr and BNP c/w acute heart failure. Patients BB and ARB and diuretic were held at admission. She has had a good response to IV lasix. BP is stable. 2D echo is pending.  Plan Follow up lab in AM   [facility] IM (o) [phone]; (c) [phone] Call-grp - Patsi Sears IM  Tele: [phone]  10/23/2012, 6:32 PM

## 2012-10-24 ENCOUNTER — Inpatient Hospital Stay (HOSPITAL_COMMUNITY): Payer: Medicare Other

## 2012-10-24 DIAGNOSIS — Z7901 Long term (current) use of anticoagulants: Secondary | ICD-10-CM | POA: Diagnosis not present

## 2012-10-24 DIAGNOSIS — R42 Dizziness and giddiness: Secondary | ICD-10-CM | POA: Diagnosis not present

## 2012-10-24 DIAGNOSIS — I959 Hypotension, unspecified: Secondary | ICD-10-CM | POA: Diagnosis not present

## 2012-10-24 DIAGNOSIS — R1310 Dysphagia, unspecified: Secondary | ICD-10-CM | POA: Diagnosis not present

## 2012-10-24 DIAGNOSIS — I519 Heart disease, unspecified: Secondary | ICD-10-CM | POA: Diagnosis not present

## 2012-10-24 DIAGNOSIS — R7401 Elevation of levels of liver transaminase levels: Secondary | ICD-10-CM | POA: Diagnosis not present

## 2012-10-24 DIAGNOSIS — I5033 Acute on chronic diastolic (congestive) heart failure: Secondary | ICD-10-CM

## 2012-10-24 DIAGNOSIS — D509 Iron deficiency anemia, unspecified: Secondary | ICD-10-CM

## 2012-10-24 DIAGNOSIS — I509 Heart failure, unspecified: Secondary | ICD-10-CM

## 2012-10-24 DIAGNOSIS — I059 Rheumatic mitral valve disease, unspecified: Secondary | ICD-10-CM

## 2012-10-24 LAB — PROTIME-INR
INR: 2.58 — ABNORMAL HIGH (ref 0.00–1.49)
Prothrombin Time: 26.4 seconds — ABNORMAL HIGH (ref 11.6–15.2)

## 2012-10-24 LAB — GLUCOSE, CAPILLARY
Glucose-Capillary: 110 mg/dL — ABNORMAL HIGH (ref 70–99)
Glucose-Capillary: 101 mg/dL — ABNORMAL HIGH (ref 70–99)
Glucose-Capillary: 89 mg/dL (ref 70–99)

## 2012-10-24 LAB — BASIC METABOLIC PANEL
BUN: 12 mg/dL (ref 6–23)
CO2: 31 mEq/L (ref 19–32)
Calcium: 9.4 mg/dL (ref 8.4–10.5)
Chloride: 102 mEq/L (ref 96–112)
Creatinine, Ser: 0.82 mg/dL (ref 0.50–1.10)
GFR calc Af Amer: 84 mL/min — ABNORMAL LOW (ref >90)
GFR calc non Af Amer: 72 mL/min — ABNORMAL LOW (ref >90)
Glucose, Bld: 123 mg/dL — ABNORMAL HIGH (ref 70–99)
Potassium: 4.3 mEq/L (ref 3.5–5.1)
Sodium: 139 mEq/L (ref 135–145)

## 2012-10-24 LAB — RETICULOCYTES
RBC.: 3.78 MIL/uL — ABNORMAL LOW (ref 3.87–5.11)
Retic Count, Absolute: 86.9 10*3/uL (ref 19.0–186.0)
Retic Ct Pct: 2.3 % (ref 0.4–3.1)

## 2012-10-24 LAB — CBC
HCT: 33.3 % — ABNORMAL LOW (ref 36.0–46.0)
Hemoglobin: 10.7 g/dL — ABNORMAL LOW (ref 12.0–15.0)
MCH: 28.3 pg (ref 26.0–34.0)
MCHC: 32.1 g/dL (ref 30.0–36.0)
MCV: 88.1 fL (ref 78.0–100.0)
Platelets: 111 10*3/uL — ABNORMAL LOW (ref 150–400)
RBC: 3.78 MIL/uL — ABNORMAL LOW (ref 3.87–5.11)
RDW: 16.3 % — ABNORMAL HIGH (ref 11.5–15.5)
WBC: 7.6 10*3/uL (ref 4.0–10.5)

## 2012-10-24 MED ORDER — TORSEMIDE 20 MG PO TABS
20.0000 mg | ORAL_TABLET | Freq: Every day | ORAL | Status: DC
  Administered 2012-10-24: 20 mg via ORAL
  Filled 2012-10-24: qty 1

## 2012-10-24 MED ORDER — ENOXAPARIN SODIUM 100 MG/ML ~~LOC~~ SOLN: 90.0000 mg | Freq: Two times a day (BID) | SUBCUTANEOUS | Status: DC

## 2012-10-24 MED ORDER — PANTOPRAZOLE SODIUM 40 MG PO TBEC: 40.0000 mg | DELAYED_RELEASE_TABLET | Freq: Two times a day (BID) | ORAL | Status: DC

## 2012-10-24 NOTE — Progress Notes (Signed)
Subjective: No acute events.  Feels well.  Objective: Vital signs in last 24 hours: Temp:  [98.2 F (36.8 C)-98.9 F (37.2 C)] 98.2 F (36.8 C) (06/03 0550) Pulse Rate:  [72-105] 73 (06/03 0554) Resp:  [20-22] 20 (06/03 0550) BP: (103-156)/(47-76) 123/60 mmHg (06/03 0554) SpO2:  [88 %-98 %] 96 % (06/03 0550) Weight:  [202 lb 6.4 oz (91.808 kg)] 202 lb 6.4 oz (91.808 kg) (06/03 0552) Last BM Date: 10/23/12  Intake/Output from previous day: 06/02 0701 - 06/03 0700 In: 991.3 [P.O.:360; I.V.:631.3] Out: 6301 [Urine:6300; Stool:1] Intake/Output this shift:    General appearance: alert and no distress GI: soft, non-tender; bowel sounds normal; no masses,  no organomegaly  Lab Results:  Recent Labs  10/21/12 1005 10/22/12 0220 10/23/12 0527 10/24/12 0410  WBC 4.5 5.4  --  7.6  HGB 10.8* 11.2* 10.2* 10.7*  HCT 33.5* 34.9* 32.6* 33.3*  PLT 122* 131*  --  111*   BMET  Recent Labs  10/21/12 1005 10/22/12 0220 10/24/12 0410  NA 138 135 139  K 4.1 4.2 4.3  CL 101 100 102  CO2 25 21 31   GLUCOSE 229* 298* 123*  BUN 21 21 12   CREATININE 0.78 0.67 0.82  CALCIUM 9.7 9.2 9.4   LFT No results found for this basename: PROT, ALBUMIN, AST, ALT, ALKPHOS, BILITOT, BILIDIR, IBILI,  in the last 72 hours PT/INR  Recent Labs  10/23/12 0527 10/24/12 0410  LABPROT 33.5* 26.4*  INR 3.55* 2.58*   Hepatitis Panel  Recent Labs  10/22/12 1355  HEPBSAG NEGATIVE  HCVAB NEGATIVE   C-Diff No results found for this basename: CDIFFTOX,  in the last 72 hours Fecal Lactopherrin No results found for this basename: FECLLACTOFRN,  in the last 72 hours  Studies/Results: Dg Chest 2 View  10/23/2012   *RADIOLOGY REPORT*  Clinical Data: Shortness of breath.  CHEST - 2 VIEW  Comparison: 10/21/2012.  Findings: Trachea is midline.  Heart size stable.  Mild diffuse interstitial prominence and indistinctness, new.  Probable tiny bilateral pleural effusions. Biapical pleural thickening.   IMPRESSION: Congestive heart failure.   Original Report Authenticated By: Leanna Battles, M.D.    Medications:  Scheduled: . atorvastatin  20 mg Oral q1800  . calcium-vitamin D  1 tablet Oral Daily  . citalopram  20 mg Oral Daily  . folic acid  1 mg Oral Daily  . insulin aspart  0-15 Units Subcutaneous TID WC  . insulin aspart  0-5 Units Subcutaneous QHS  . latanoprost  1 drop Both Eyes QHS  . losartan  25 mg Oral Daily  . metFORMIN  500 mg Oral BID WC  . metoprolol succinate  50 mg Oral BID  . pantoprazole  40 mg Oral BID  . sodium chloride  3 mL Intravenous Q12H  . torsemide  20 mg Oral Daily  . vitamin C  500 mg Oral Daily  . Warfarin - Pharmacist Dosing Inpatient   Does not apply q1800   Continuous: . sodium chloride 20 mL/hr (10/23/12 1515)    Assessment/Plan: 1) Dysphagia.   Plan: 1) Esophagram is pending at this time.  Await results.  LOS: 3 days   Lillybeth Tal D 10/24/2012, 7:50 AM

## 2012-10-24 NOTE — Progress Notes (Signed)
Pt discharge instructions and patient education completed. Pt voiced understanding of medications and follow up appointments. IV site d/c. Site WNL. D/C home with husband. Dion Saucier

## 2012-10-24 NOTE — Progress Notes (Signed)
Patient is doing well: normal respirations. hgb is stable. Studies are done.  Plan - d/c home F/u 1. [facility] thrusday AM for INR       2. Call Dr. Loreta Ave for follow up appointment       3. To see Dr. Debby Bud in 7-10 days  Dictated [phone]

## 2012-10-24 NOTE — Progress Notes (Signed)
  Echocardiogram 2D Echocardiogram has been performed.  Cathie Beams 10/24/2012, 12:58 PM

## 2012-10-24 NOTE — Clinical Documentation Improvement (Signed)
CHF DOCUMENTATION CLARIFICATION QUERY  THIS DOCUMENT IS NOT A PERMANENT PART OF THE MEDICAL RECORD  TO RESPOND TO THE THIS QUERY, FOLLOW THE INSTRUCTIONS BELOW:  1. If needed, update documentation for the patient's encounter via the notes activity.  2. Access this query again and click edit on the In [employer].  3. After updating, or not, click F2 to complete all highlighted (required) fields concerning your review. Select "additional documentation in the medical record" OR "no additional documentation provided".  4. Click Sign note button.  5. The deficiency will fall out of your In Basket *Please let us know if you are not able to complete this workflow by phone or e-mail (listed below).  Please update your documentation within the medical record to reflect your response to this query.                                                                                    10/24/12  Dear Dr.Norins/ Associates,  In a better effort to capture your patient's severity of illness, reflect appropriate length of stay and utilization of resources, a review of the patient medical record has revealed the following indicators the diagnosis of Heart Failure.    Based on your clinical judgment, please clarify and document in a progress note and/or discharge summary the clinical condition associated with the following supporting information:  In responding to this query please exercise your independent judgment.  The fact that a query is asked, does not imply that any particular answer is desired or expected.    Possible Clinical Conditions?   Acute Systolic Congestive Heart Failure  Acute Diastolic Congestive Heart Failure  Acute Systolic & Diastolic Congestive Heart Failure  Other Condition  Cannot Clinically Determine    Risk Factors: CXR and BNP consistent with Acute heart failure, good response to IV Lasix noted per 6/02 progress notes. Very good diuresis, 5L, with improvement in  respiratory function noted per 6/03 progress notes.   Diagnostics: 6/02: proBNP: 2324.0   Reviewed:- see d/c summary - acute on chronic diastolic heart failure  Thank You,  Marciano Sequin,  Clinical Documentation Specialist:  Phone: [phone]  Health Information Management [place]

## 2012-10-24 NOTE — Progress Notes (Signed)
Subjective: Dr. Kenna Gilbert consult appreciated.   Patient reports she is feeling better. No respiratory distress.  Objective: Lab:  Recent Labs  10/21/12 1005 10/22/12 0220 10/23/12 0527 10/24/12 0410  WBC 4.5 5.4  --  7.6  NEUTROABS 2.3  --   --   --   HGB 10.8* 11.2* 10.2* 10.7*  HCT 33.5* 34.9* 32.6* 33.3*  MCV 88.2 87.9  --  88.1  PLT 122* 131*  --  111*   Retic cnt % 2.3, manual count 86.9 - normal  Recent Labs  10/21/12 1005 10/22/12 0220 10/24/12 0410  NA 138 135 139  K 4.1 4.2 4.3  CL 101 100 102  GLUCOSE 229* 298* 123*  BUN 21 21 12   CREATININE 0.78 0.67 0.82  CALCIUM 9.7 9.2 9.4    Imaging: no new imaging  Scheduled Meds: . atorvastatin  20 mg Oral q1800  . calcium-vitamin D  1 tablet Oral Daily  . citalopram  20 mg Oral Daily  . folic acid  1 mg Oral Daily  . insulin aspart  0-15 Units Subcutaneous TID WC  . insulin aspart  0-5 Units Subcutaneous QHS  . latanoprost  1 drop Both Eyes QHS  . losartan  25 mg Oral Daily  . metFORMIN  500 mg Oral BID WC  . metoprolol succinate  50 mg Oral BID  . pantoprazole  40 mg Oral BID  . sodium chloride  3 mL Intravenous Q12H  . vitamin C  500 mg Oral Daily  . Warfarin - Pharmacist Dosing Inpatient   Does not apply q1800   Continuous Infusions: . sodium chloride 20 mL/hr (10/23/12 1515)   PRN Meds:.acetaminophen, acetaminophen, albuterol, bisacodyl, cyclobenzaprine, ondansetron (ZOFRAN) IV, ondansetron, traMADol, zolpidem   Physical Exam: Filed Vitals:   10/24/12 0554  BP: 123/60  Pulse: 73  Temp:   Resp:    \  Intake/Output Summary (Last 24 hours) at 10/24/12 4098 Last data filed at 10/24/12 0200  Gross per 24 hour  Intake 991.25 ml  Output   6301 ml  Net -5309.75 ml   Gen'l overweight white woman in no distress HEENT- C&S clear Cor- 2+ radial, RRR Pulm - normal respirations, lungs - CTAP Abd- soft Neuro - A&O x 3.       Assessment/Plan: 1. GI - Hgb is stable. Studies reveal normal  retic count, low iron sat % c/w blood loss anemia or iron deficiency anemia. Hgb is stable. Patient w/ hepatosteatosis with question of early cirrhotic changes in a non-drinker.   Plan Barium swallow to evaluated dysphagia  Outpatient colonoscopy and possible EGD  Lipid and weight management re: liver disease.  2. Card - very good diuresis, 5 Liters, with improvement in respiratory function. 2 D echo pending  Plan - stop IV lasix and resume demadex  Continue cardiac meds  Dispo - possible home this PM   [employer] IM (o) [phone]; (c) [phone] Call-grp - Patsi Sears IM  Tele: [phone]  10/24/2012, 7:00 AM

## 2012-10-24 NOTE — Progress Notes (Signed)
ANTICOAGULATION CONSULT NOTE - Follow Up Consult  Pharmacy Consult for Lovenox when INR <2.5; Coumadin on hold Indication: mechanical AVR  Allergies  Allergen Reactions  . Iodinated Diagnostic Agents Rash     Red rash after cardiac cath 1 wk ago, ? Contrast allergy, requires 13 hr prep now per dr.[email or web address]//a.[email or web address]    Patient Measurements: Height: 5\' 2"  (157.5 cm) Weight: 202 lb 6.4 oz (91.808 kg) IBW/kg (Calculated) : 50.1  Vital Signs: Temp: 98.3 F (36.8 C) (06/03 1430) Temp src: Oral (06/03 1430) BP: 143/63 mmHg (06/03 1430) Pulse Rate: 78 (06/03 1430)  Labs:  Recent Labs  10/21/12 2052  10/22/12 0220 10/23/12 0527 10/24/12 0410  HGB  --   < > 11.2* 10.2* 10.7*  HCT  --   --  34.9* 32.6* 33.3*  PLT  --   --  131*  --  111*  LABPROT  --   --  28.7* 33.5* 26.4*  INR  --   --  2.88* 3.55* 2.58*  CREATININE  --   --  0.67  --  0.82  TROPONINI <0.30  --  <0.30  --   --   < > = values in this interval not displayed.  Estimated Creatinine Clearance: 70.2 ml/min (by C-G formula based on Cr of 0.82).  Assessment:  Coumadin on hold for GI procedures, to be done as oupatient, when INR <1.7.  Home Coumadin dose 3 mg daily. Last given 5/31.  INR down to 2.58, so expect <2.5 by am.  Patient may be discharged later today. She reports that she usually has PT/INR checked at [facility].   Platelet count down some.  No bleeding noted.   Goal of Therapy:  INR 2.5-3.5 Monitor platelets by anticoagulation protocol: Yes   Plan:   No Coumadin again today.  No Lovenox needed yet. Dose planned is 90 mg SQ q12hrs.  If discharged tonight, could begin Lovenox in am.  Daily PT/INR and CBC.  Will follow up 6/4 if not discharged.  Dennie Fetters, [place] Pager: [phone] 10/24/2012,5:06 PM

## 2012-10-25 ENCOUNTER — Ambulatory Visit (INDEPENDENT_AMBULATORY_CARE_PROVIDER_SITE_OTHER): Payer: Medicare Other | Admitting: Pharmacist Clinician (PhC)/ Clinical Pharmacy Specialist

## 2012-10-25 VITALS — BP 118/50 | HR 72

## 2012-10-25 DIAGNOSIS — Z7901 Long term (current) use of anticoagulants: Secondary | ICD-10-CM | POA: Diagnosis not present

## 2012-10-25 DIAGNOSIS — Z954 Presence of other heart-valve replacement: Secondary | ICD-10-CM

## 2012-10-25 DIAGNOSIS — Z952 Presence of prosthetic heart valve: Secondary | ICD-10-CM

## 2012-10-25 LAB — POCT INR: INR: 1.5

## 2012-10-25 NOTE — Discharge Summary (Signed)
NAMEGEORGINE, Abigail Oliver NO.:  [id number]  MEDICAL RECORD NO.:  [id number]  LOCATION:  2029                         FACILITY:  [facility]  PHYSICIAN:  Rosalyn Gess. Sada Mazzoni, MD  DATE OF BIRTH:  03/21/1946  DATE OF ADMISSION:  10/21/2012 DATE OF DISCHARGE:  10/24/2012                              DISCHARGE SUMMARY   ADMITTING DIAGNOSES: 1. Dizziness with presyncope. 2. Hypotension. 3. Type 2 diabetes. 4. History of coronary artery disease. 5. Volume depletion.  DISCHARGE DIAGNOSES: 1. Iron deficiency anemia. 2. Dehydration, resolved. 3. Acute on chronic diastolic heart failure. 4. Type 2 diabetes, stable. 5. Coronary artery disease.  CONSULTANTS:  Anselmo Rod, MD, [facility], for Gastroenterology.  PROCEDURES/IMAGING: 1. Two-view of the chest, Oct 21, 2012, with no acute cardiopulmonary     process. 2. CT of the abdomen and pelvis, October 22, 2012, showed no aneurysmal     dilatation of the aorta.  Hepatic steatosis and questionable mild     cirrhotic change.  No acute abdominal pelvic process otherwise     noted. 3. Two-view of the chest on October 23, 2012, revealed acute congestive     heart failure with diffuse interstitial prominence.  Biapical     pleural thickening is noted. 4. Esophagram with barium swallow which revealed 13-mm barium tablet,     transiently lodging at the GE junction, but passed in the stomach     with a swallow of barium.  The patient had no discomfort study, was     otherwise negative.  HISTORY OF PRESENT ILLNESS:  [name] is a delightful 67 year old woman with a history of CAD, aortic valve disease with status post aortic valve replacement, diabetes, who presented with dizziness and near syncope.  She states that for the past 3 days, she had been dizzy and lightheaded.  When she got to work on the morning of admission, she was very lightheaded.  Because her symptoms did not clear, she presented to the emergency department for evaluation.   She denied any chest pain, focal weakness, nausea, vomiting, diarrhea, melena, hematochezia, cough, fevers, or dysuria.  The patient initially went to urgent care, but because of hypotension, she was transferred to [facility] ED with a blood pressure of 84/49.  The patient was bolused IV fluids in the emergency department, getting a systolic blood pressure into the 120s and she was subsequently admitted.  Troponin and D-dimer in the ED were negative. Urinalysis was unremarkable.  Hemoglobin was 10.8, down from a previous value in April, 12.6.  Stool Hemoccult negative.  CT abdomen and pelvis was unremarkable.  HOSPITAL COURSE: 1. GI:  The patient's hemoglobin did stabilize.  No transfusion was     required.  Laboratory revealed that the patient to have a normal     reticulocyte count, but a low iron saturation of 14% and serum iron     was at the low range of normal at 50, consistent with a blood loss     anemia or iron deficiency anemia.  The patient does have hepatic     steatosis.  The patient was seen in consultation initially by Dr.     Wendall Papa, on-call for Dr. Jolee Ewing  Mann.  Dr. Loreta Ave ordered a barium     swallow which turned out to be negative as above.  She felt the     patient was a candidate for outpatient colonoscopy and possible     EGD.  The patient also need to have better weight management and     lipid management as an outpatient.  With the patient's hemoglobin     being stable, with no signs of GI distress, with no obstruction by     barium swallow, the patient is stable and ready for discharge. 2. Cardiology:  The patient has a history of diastolic heart failure.     She has been on ARB, Demadex, and additional blood pressure     medications, all of which were held at admission for hypotension.     The patient was vigorously hydrated with IV fluids and she     subsequently became fluid overloaded with increasing shortness of     breath, decreased breath sounds and rales.   Chest x-ray as above     revealed her to be in acute heart failure.  She was treated with IV     Lasix q.6 x3 with a diuresis of approximately 5 L with marked     improvement in her respiratory status.  Her home medications were     restarted and she continued to do well.  A 2D echo was performed     which revealed the patient to have an ejection fraction of 60% to     65%.  Wall motion was normal with no regional wall motion     abnormalities.  Features were consistent with pseudo abnormal left     ventricular filling pattern with concomitant abnormal relaxation     and increased filling pressures, consistent with grade 2 diastolic     dysfunction.  Aortic valve was poorly visualized.  By history, she     has a [employer] aortic valve.  There was trivial aortic     insufficiency.  With the patient's acute on chronic diastolic heart     failure being resolved with good breath sounds, no respiratory     distress.  She is felt to be stable and ready for discharge home.     We shall continue on her home medications which had controlled her     in the past. 3. Anticoagulation:  The patient was taken off Coumadin in     anticipation of GI studies.  Her Coumadin/INR was slow to come down     and was 2.5 on the day of discharge.  PLAN:  The patient is to start Lovenox 90 mg subcu q.12 hours on the morning of October 25, 2012.  She will have a followup INR at [facility] on October 26, 2012, in the a.m.  She will need to have an INR of 1.7 prior to GI procedures.  Information from [facility] will only be transmitted to Dr. Charna Elizabeth.  DISCHARGE EXAMINATION:  VITAL SIGNS:  Temperature was 98.3, blood pressure 143/63, heart rate 78, respirations 18, O2 sats 93% on room air. GENERAL APPEARANCE:  This is an overweight Caucasian female, in no acute distress. HEENT:  Conjunctivae and sclerae were clear without icterus. NECK:  Supple.  There was no JVD. PULMONARY:   The patient is moving air well with no rales, wheezes, or rhonchi.  No increased work of breathing. CARDIOVASCULAR:  2+ radial pulses.  She has a quiet  precordium.  She has a sound of sauna mechanical valve, best heard at the right sternal border.  Rhythm is regular. ABDOMEN:  Obese, soft.  No guarding or rebound is noted.  FINAL LABORATORY DATA:  From the day of discharge, chemistries with a sodium of 139, potassium 4.3, chloride of 102, CO2 of 31, BUN of 12, creatinine 0.82, glucose was 123.  CBC from October 24, 2012, with a white count of 7600, hemoglobin 10.7 and stable, hematocrit was 33.3%, platelet count 111,000.  The patient had a reticulocyte count of 2.3% with an absolute count of 86.9.  INR was 2.58.  A1c, Oct 21, 2012, was 6.4%, reflecting good control.  DISCHARGE MEDICATIONS: 1. Vitamin C 500 mg daily. 2. Aspirin 81 mg daily. 3. Calcium with D taken daily. 4. Celexa 20 mg taken daily. 5. Flexeril 10 mg t.i.d. p.r.n. for muscle spasm. 6. Folic acid 1 mg daily. 7. Xalatan eye drops 0.005%, 1 drop in both eyes at bedtime. 8. Losartan 25 mg daily. 9. Metformin 500 mg b.i.d. 10.Metoprolol succinate XL 50 mg taken b.i.d. 11.Protonix 40 mg b.i.d. until seen by Dr. Loreta Ave. 12.Potassium 10 mEq daily. 13.Crestor 10 mg daily. 14.Demadex 20 mg daily. 15.Tramadol 50 mg every 8 hours as needed for pain. 16.Coumadin is on hold. 17.Ambien 10 mg q.h.s. as needed.  DISPOSITION:  The patient was discharged home.  She is instructed to follow up at [facility] on Thursday to monitor INR, information to be passed on to Dr. Loreta Ave.  The patient will start Lovenox 90 mg subcu q.12 hours on the a.m. of October 25, 2012.  The patient is to contact Dr. Kenna Gilbert office in regard to followup appointments and any procedures.  The patient is to see Dr. Debby Bud in 7 to 10 days.  The office will call with an appointment time.  The patient's condition at the time of discharge dictation  is medically stable.     Rosalyn Gess Amila Callies, MD     MEN/MEDQ  D:  10/24/2012  T:  10/25/2012  Job:  [id number]  cc:   Anselmo Rod, MD, Arelia Sneddon, M.D.

## 2012-10-26 ENCOUNTER — Other Ambulatory Visit: Payer: Self-pay | Admitting: Gastroenterology

## 2012-10-26 ENCOUNTER — Telehealth: Payer: Self-pay | Admitting: General Practice

## 2012-10-26 DIAGNOSIS — K219 Gastro-esophageal reflux disease without esophagitis: Secondary | ICD-10-CM | POA: Diagnosis not present

## 2012-10-26 DIAGNOSIS — Z1211 Encounter for screening for malignant neoplasm of colon: Secondary | ICD-10-CM | POA: Diagnosis not present

## 2012-10-26 DIAGNOSIS — Z800 Family history of malignant neoplasm of digestive organs: Secondary | ICD-10-CM | POA: Diagnosis not present

## 2012-10-26 DIAGNOSIS — R1319 Other dysphagia: Secondary | ICD-10-CM | POA: Diagnosis not present

## 2012-10-26 NOTE — Telephone Encounter (Signed)
Attempted transitional care call.  Left message on machine.

## 2012-10-27 ENCOUNTER — Encounter (HOSPITAL_COMMUNITY)
Admission: RE | Disposition: A | Discharge status: Home / Self Care | Payer: Self-pay | Source: Ambulatory Visit | Attending: Gastroenterology

## 2012-10-27 ENCOUNTER — Ambulatory Visit (HOSPITAL_COMMUNITY)
Admission: RE | Admit: 2012-10-27 | Discharge: 2012-10-27 | Disposition: A | Discharge status: Home / Self Care | Payer: Medicare Other | Source: Ambulatory Visit | Attending: Gastroenterology | Admitting: Gastroenterology

## 2012-10-27 ENCOUNTER — Encounter (HOSPITAL_COMMUNITY): Payer: Self-pay | Admitting: *Deleted

## 2012-10-27 ENCOUNTER — Telehealth: Payer: Self-pay | Admitting: General Practice

## 2012-10-27 DIAGNOSIS — D126 Benign neoplasm of colon, unspecified: Secondary | ICD-10-CM | POA: Insufficient documentation

## 2012-10-27 DIAGNOSIS — K648 Other hemorrhoids: Secondary | ICD-10-CM | POA: Diagnosis not present

## 2012-10-27 DIAGNOSIS — K2970 Gastritis, unspecified, without bleeding: Secondary | ICD-10-CM | POA: Diagnosis not present

## 2012-10-27 DIAGNOSIS — K319 Disease of stomach and duodenum, unspecified: Secondary | ICD-10-CM | POA: Insufficient documentation

## 2012-10-27 DIAGNOSIS — D509 Iron deficiency anemia, unspecified: Secondary | ICD-10-CM | POA: Diagnosis not present

## 2012-10-27 DIAGNOSIS — K2960 Other gastritis without bleeding: Secondary | ICD-10-CM | POA: Diagnosis not present

## 2012-10-27 DIAGNOSIS — R1310 Dysphagia, unspecified: Secondary | ICD-10-CM | POA: Insufficient documentation

## 2012-10-27 DIAGNOSIS — K644 Residual hemorrhoidal skin tags: Secondary | ICD-10-CM | POA: Insufficient documentation

## 2012-10-27 DIAGNOSIS — R1319 Other dysphagia: Secondary | ICD-10-CM | POA: Diagnosis not present

## 2012-10-27 DIAGNOSIS — K2990 Gastroduodenitis, unspecified, without bleeding: Secondary | ICD-10-CM | POA: Diagnosis not present

## 2012-10-27 DIAGNOSIS — K649 Unspecified hemorrhoids: Secondary | ICD-10-CM | POA: Diagnosis not present

## 2012-10-27 HISTORY — DX: Unspecified osteoarthritis, unspecified site: M19.90

## 2012-10-27 HISTORY — PX: ESOPHAGOGASTRODUODENOSCOPY: SHX5428

## 2012-10-27 HISTORY — PX: COLONOSCOPY: SHX5424

## 2012-10-27 HISTORY — DX: Myoneural disorder, unspecified: G70.9

## 2012-10-27 HISTORY — DX: Gastro-esophageal reflux disease without esophagitis: K21.9

## 2012-10-27 HISTORY — DX: Nausea with vomiting, unspecified: R11.2

## 2012-10-27 HISTORY — DX: Other specified postprocedural states: Z98.890

## 2012-10-27 HISTORY — DX: Anemia, unspecified: D64.9

## 2012-10-27 SURGERY — EGD (ESOPHAGOGASTRODUODENOSCOPY)
Anesthesia: Moderate Sedation

## 2012-10-27 MED ORDER — DIPHENHYDRAMINE HCL 50 MG/ML IJ SOLN
INTRAMUSCULAR | Status: AC
  Filled 2012-10-27: qty 1

## 2012-10-27 MED ORDER — MIDAZOLAM HCL 5 MG/5ML IJ SOLN
INTRAMUSCULAR | Status: DC | PRN
  Administered 2012-10-27: 2 mg via INTRAVENOUS
  Administered 2012-10-27: 1 mg via INTRAVENOUS
  Administered 2012-10-27 (×2): 2 mg via INTRAVENOUS

## 2012-10-27 MED ORDER — LACTATED RINGERS IV SOLN
INTRAVENOUS | Status: DC
  Administered 2012-10-27: 1000 mL via INTRAVENOUS

## 2012-10-27 MED ORDER — FENTANYL CITRATE 0.05 MG/ML IJ SOLN
INTRAMUSCULAR | Status: DC | PRN
  Administered 2012-10-27 (×4): 25 ug via INTRAVENOUS

## 2012-10-27 MED ORDER — FENTANYL CITRATE 0.05 MG/ML IJ SOLN
INTRAMUSCULAR | Status: AC
  Filled 2012-10-27: qty 2

## 2012-10-27 MED ORDER — SODIUM CHLORIDE 0.9 % IV SOLN: INTRAVENOUS | Status: DC

## 2012-10-27 MED ORDER — MIDAZOLAM HCL 10 MG/2ML IJ SOLN
INTRAMUSCULAR | Status: AC
  Filled 2012-10-27: qty 2

## 2012-10-27 NOTE — Interval H&P Note (Signed)
History and Physical Interval Note:  10/27/2012 2:37 PM  Abigail Oliver  has presented today for surgery, with the diagnosis of IDA  The various methods of treatment have been discussed with the patient and family. After consideration of risks, benefits and other options for treatment, the patient has consented to  Procedure(s): ESOPHAGOGASTRODUODENOSCOPY (EGD) (N/A) COLONOSCOPY (N/A) as a surgical intervention .  The patient's history has been reviewed, patient examined, no change in status, stable for surgery.  I have reviewed the patient's chart and labs.  Questions were answered to the patient's satisfaction.     Rainelle Sulewski D

## 2012-10-27 NOTE — Op Note (Signed)
[facility] [address] [place], [address]   OPERATIVE PROCEDURE REPORT  PATIENT: Abigail Oliver, Abigail Oliver.  MR#: [id number] BIRTHDATE: 27-Dec-1945  GENDER: Female ENDOSCOPIST: [name], MD ASSISTANT:   Jadene Pierini, technician and Harold Barban, RN PROCEDURE DATE: 10/27/2012 PROCEDURE:   EGD, diagnostic ASA CLASS:   Class III INDICATIONS:Dysphagia and abnormal esophagram.. MEDICATIONS: See the colonoscopy. TOPICAL ANESTHETIC:   none  DESCRIPTION OF PROCEDURE:   After the risks benefits and alternatives of the procedure were thoroughly explained, informed consent was obtained.  The Pentax Gastroscope [id number]  endoscope was introduced through the mouth  and advanced to the second portion of the duodenum Without limitations.      The instrument was slowly withdrawn as the mucosa was fully examined.      FINDINGS: The distal esophagus was widely patent.  No evidence of any strictures or inflammation.  In the antrum a moderate gastritis was identified and multiple cold biopsies were obtained. Retroflexed views revealed no abnormalities.     The scope was then withdrawn from the patient and the procedure terminated.  COMPLICATIONS: There were no complications.  IMPRESSION: 1) Antral gastritis. 2) Widely patent esophagus.  RECOMMENDATIONS: 1) Await biopsy results. 2) PPI.  _______________________________ Rosalie DoctorJeani Hawking, MD 10/27/2012 3:52 PM

## 2012-10-27 NOTE — Op Note (Signed)
[facility] [address] [place], [address]   OPERATIVE PROCEDURE REPORT  PATIENT: Abigail, Oliver.  MR#: [id number] BIRTHDATE: [birth year]  GENDER: Female ENDOSCOPIST: [name], MD ASSISTANT:   Jadene Pierini, technician Harold Barban, RN PROCEDURE DATE: 10/27/2012 PROCEDURE:   Colonoscopy with snare polypectomy ASA CLASS:   Class III INDICATIONS:Iron Deficiency Anemia. MEDICATIONS: Versed 7 mg IV and Fentanyl 100 mcg IV  DESCRIPTION OF PROCEDURE:   After the risks benefits and alternatives of the procedure were thoroughly explained, informed consent was obtained.  A digital rectal exam revealed no abnormalities of the rectum.    The Pentax Adult Colonscope [id number] endoscope was introduced through the anus  and advanced to the terminal ileum which was intubated for a short distance , No adverse events experienced.    The quality of the prep was excellent. .  The instrument was then slowly withdrawn as the colon was fully examined.     FINDINGS: A 4 mm sessile descending colon polyp was removed with a cold snare.  No evidence of any masses, inflammation, ulcerations, erosions, or vascular abnormalities.   Retroflexed views revealed internal/external hemorrhoids.     The scope was then withdrawn from the patient and the procedure terminated.  COMPLICATIONS: There were no complications.  IMPRESSION: 1) Polyp. 2) Int/Ext hemorrhoids. RECOMMENDATIONS: 1) Await biopsy results. 2) Repeat the colonoscopy in 5 years. 3) Capsule endoscopy per Dr. Loreta Ave.  _______________________________ eSigned:  [name], MD 10/27/2012 3:43 PM

## 2012-10-27 NOTE — H&P (View-Only) (Signed)
Patient is doing well: normal respirations. hgb is stable. Studies are done.  Plan - d/c home F/u 1. [facility] thrusday AM for INR       2. Call Dr. Mann for follow up appointment       3. To see Dr. Kieran Nachtigal in 7-10 days  Dictated #[id number] 

## 2012-10-27 NOTE — Telephone Encounter (Signed)
Transitional Care Call.  Originally attempted on 6/5 (see phone note). Pt discharged from hospital on 6/3/2-14.  D/C diagnosis:  Iron deficiency anemia.  Spoke with patient today.  Patient denies any questions in regards to hospital discharge instructions.  Patient denies shortness of breath but does say that she is having tingling in hands and feet.  RN reviewed medications with patient and pt states that she has all medication in the home.  Educated patient about weighting on a daily basis and recording weight in a log book and pt verbalized understanding.  Patient has appointment with [facility] to follow up on INR.   Patient lives with her husband.  Patient has follow up appointment with Dr. Debby Bud on 11/02/2012.

## 2012-10-30 ENCOUNTER — Ambulatory Visit (INDEPENDENT_AMBULATORY_CARE_PROVIDER_SITE_OTHER): Payer: Medicare Other | Admitting: Pharmacist Clinician (PhC)/ Clinical Pharmacy Specialist

## 2012-10-30 ENCOUNTER — Encounter (HOSPITAL_COMMUNITY): Payer: Self-pay | Admitting: Gastroenterology

## 2012-10-30 VITALS — BP 118/56 | HR 76

## 2012-10-30 DIAGNOSIS — Z954 Presence of other heart-valve replacement: Secondary | ICD-10-CM

## 2012-10-30 DIAGNOSIS — Z952 Presence of prosthetic heart valve: Secondary | ICD-10-CM

## 2012-10-30 DIAGNOSIS — Z7901 Long term (current) use of anticoagulants: Secondary | ICD-10-CM | POA: Diagnosis not present

## 2012-10-30 LAB — POCT INR: INR: 1.2

## 2012-10-30 MED ORDER — ROSUVASTATIN CALCIUM 10 MG PO TABS: 10.0000 mg | ORAL_TABLET | Freq: Every day | ORAL | Status: DC

## 2012-11-02 ENCOUNTER — Ambulatory Visit (INDEPENDENT_AMBULATORY_CARE_PROVIDER_SITE_OTHER): Payer: Medicare Other | Admitting: Internal Medicine

## 2012-11-02 ENCOUNTER — Encounter: Payer: Self-pay | Admitting: Internal Medicine

## 2012-11-02 ENCOUNTER — Ambulatory Visit (INDEPENDENT_AMBULATORY_CARE_PROVIDER_SITE_OTHER): Payer: Medicare Other | Admitting: Pharmacist Clinician (PhC)/ Clinical Pharmacy Specialist

## 2012-11-02 VITALS — BP 110/64 | HR 68

## 2012-11-02 VITALS — BP 120/58 | HR 75 | Temp 97.9°F | Ht 62.0 in | Wt 192.8 lb

## 2012-11-02 DIAGNOSIS — I5189 Other ill-defined heart diseases: Secondary | ICD-10-CM

## 2012-11-02 DIAGNOSIS — Z7901 Long term (current) use of anticoagulants: Secondary | ICD-10-CM | POA: Diagnosis not present

## 2012-11-02 DIAGNOSIS — I359 Nonrheumatic aortic valve disorder, unspecified: Secondary | ICD-10-CM

## 2012-11-02 DIAGNOSIS — R55 Syncope and collapse: Secondary | ICD-10-CM

## 2012-11-02 DIAGNOSIS — I959 Hypotension, unspecified: Secondary | ICD-10-CM

## 2012-11-02 DIAGNOSIS — D649 Anemia, unspecified: Secondary | ICD-10-CM

## 2012-11-02 DIAGNOSIS — Z952 Presence of prosthetic heart valve: Secondary | ICD-10-CM

## 2012-11-02 DIAGNOSIS — Z954 Presence of other heart-valve replacement: Secondary | ICD-10-CM | POA: Diagnosis not present

## 2012-11-02 DIAGNOSIS — E119 Type 2 diabetes mellitus without complications: Secondary | ICD-10-CM

## 2012-11-02 DIAGNOSIS — D509 Iron deficiency anemia, unspecified: Secondary | ICD-10-CM

## 2012-11-02 DIAGNOSIS — R42 Dizziness and giddiness: Secondary | ICD-10-CM

## 2012-11-02 DIAGNOSIS — R0602 Shortness of breath: Secondary | ICD-10-CM

## 2012-11-02 LAB — POCT INR: INR: 3.4

## 2012-11-02 NOTE — Patient Instructions (Addendum)
Heart failure - clear lungs today. Sodium in soda is not a problem Plan Lab: metabolic panel, BNP  Anemia  In Hospital Hgb 10.7. Endoscopy was positive for gastritis which may have been the source of blood loss. Plan  Continue the pantoprazole   Lab- blood count  Near-syncope  - do not know the cause at this point. Plan 48 hour cardiac monitor. You will be notified of when and where. I have ordered it through [facility]  Glad you are feeling better

## 2012-11-05 DIAGNOSIS — D509 Iron deficiency anemia, unspecified: Secondary | ICD-10-CM | POA: Insufficient documentation

## 2012-11-05 NOTE — Assessment & Plan Note (Signed)
Anticoagulated for mechanical aortic valve. Hospitalized May '14 with Hgb 8.4.  Came to EGD revealing antral gastritis as likely source of slow blood loss exacerbated by being anticoagulated  Plan Has continued on anticoagualtion: transitioned with lovenox for procedures and has resumed warfarin  Was started on high doses PPI at last admission - she reports decreased abdominal pain and reflux - will continue PPI bid for a full month then daily.

## 2012-11-05 NOTE — Progress Notes (Signed)
Subjective:    Patient ID: Abigail Oliver, female    DOB: 1945/06/15, 67 y.o.   MRN: [id number]  HPI Ms. Mcnicholas is seen in follow up after recent hospitalization May 31- October 24, 2012 for dizziness, anemia, acute on chronic systolic heart failure. She has a h/o CAD, AoVR, DM, colon polyps. She was found to have an iron deficiency anemia but did not require transfusion. Leading diagnostic concerns were UGI loss vs LGI with polyp. She was seen in consultation by Dr. Loreta Ave. Due to hypotension at admission her regular medications were held including diuretics. With IV hydration she did go into acute pulmonary edema. She did respond to IV diuretics and at discharge had resumed her home medications.  Since discharge she has done well but will still have episodes of light-headedness if on her feet for long. Dr. Loreta Ave saw her for follow up and Dr. Elnoria Howard did perform EGD and Colonoscopy June 6th: adenomatous polyp on colonoscopy, antral gastritis on EGD.   PMH, FamHx and SocHx reviewed for any changes and relevance.  Current Outpatient Prescriptions on File Prior to Visit  Medication Sig Dispense Refill  . Ascorbic Acid (VITAMIN C) 500 MG tablet Take 500 mg by mouth daily.        Marland Kitchen aspirin EC 81 MG tablet Take 81 mg by mouth daily.      . calcium-vitamin D (CALCIUM 500+D) 500-200 MG-UNIT per tablet Take 1 tablet by mouth daily.      . citalopram (CELEXA) 20 MG tablet TAKE 1 TABLET BY MOUTH DAILY  30 tablet  0  . cyclobenzaprine (FLEXERIL) 10 MG tablet Take 10 mg by mouth 3 (three) times daily as needed for muscle spasms.      Marland Kitchen enoxaparin (LOVENOX) 100 MG/ML injection Inject 0.9 mLs (90 mg total) into the skin every 12 (twelve) hours.  10 Syringe  1  . folic acid (FOLVITE) 1 MG tablet Take 1 mg by mouth daily.       Marland Kitchen latanoprost (XALATAN) 0.005 % ophthalmic solution Place 1 drop into both eyes at bedtime.      Marland Kitchen losartan (COZAAR) 25 MG tablet Take 1 by mouth daily      . metFORMIN (GLUCOPHAGE) 500 MG  tablet TAKE 1 TABLET BY MOUTH TWICE DAILY WITH A MEAL  60 tablet  0  . metoprolol succinate (TOPROL-XL) 50 MG 24 hr tablet Take 50 mg by mouth 2 (two) times daily. Take with or immediately following a meal.      . pantoprazole (PROTONIX) 40 MG tablet Take 1 tablet (40 mg total) by mouth 2 (two) times daily.  60 tablet  1  . potassium chloride (K-DUR,KLOR-CON) 10 MEQ tablet Take 1 tablet (10 mEq total) by mouth 2 (two) times daily.  60 tablet  10  . rosuvastatin (CRESTOR) 10 MG tablet Take 1 tablet (10 mg total) by mouth at bedtime.  28 tablet  0  . torsemide (DEMADEX) 20 MG tablet Take 20 mg by mouth daily.      . traMADol (ULTRAM) 50 MG tablet Take 1 tablet (50 mg total) by mouth every 8 (eight) hours as needed for pain.  100 tablet  1  . zolpidem (AMBIEN) 10 MG tablet Take 5 mg by mouth at bedtime as needed. For sleep      . glucose blood (ONE TOUCH ULTRA TEST) test strip Use as instructed once daily to test blood glucose. Dx: 250.0  100 each  12  . Lancets (ONETOUCH ULTRASOFT) lancets Use  as instructed once daily to test blood glucose. Dx: 250.0  100 each  12   No current facility-administered medications on file prior to visit.      Review of Systems System review is negative for any constitutional, cardiac, pulmonary, GI or neuro symptoms or complaints other than as described in the HPI.     Objective:   Physical Exam Filed Vitals:   11/02/12 1612  BP: 120/58  Pulse: 75  Temp: 97.9 F (36.6 C)   Wt Readings from Last 3 Encounters:  11/02/12 192 lb 12.8 oz (87.454 kg)  10/24/12 202 lb 6.4 oz (91.808 kg)  09/11/12 202 lb (91.627 kg)   Gen'l - overweight white woman in no distress HEENT - C&S clear Cor- 2+ radial, RRR, mechanical valve click on auscultation chest, no JVD Pulm - Clear to ausculatation and percussion, no rales Neuro - Alert and oriented             Assessment & Plan:

## 2012-11-05 NOTE — Assessment & Plan Note (Signed)
Lab Results  Component Value Date   HGBA1C 6.4 10/21/2012   Good control on present medication.

## 2012-11-05 NOTE — Assessment & Plan Note (Signed)
Stable at exam today w/o signs of decompensation.  Plan Continue present medications  Lab - follow up BNP for baseline

## 2012-11-05 NOTE — Assessment & Plan Note (Signed)
Patient reports episodes that occur several times a week of feeling light-headed and near syncopal. She denies any palpations or other specific symptoms. Episodes last several minutes. During hospital stay May 31-October 24, 2012 normal telemetry. These episodes sound like hypotensive episodes that occur when she is on her feet for a prolonged period of time.  PLan 48 hr Holter.

## 2012-11-05 NOTE — Assessment & Plan Note (Signed)
Stable. Followed by [facility]. Is fully anticoagulated.

## 2012-11-06 ENCOUNTER — Other Ambulatory Visit (INDEPENDENT_AMBULATORY_CARE_PROVIDER_SITE_OTHER): Payer: Medicare Other

## 2012-11-06 DIAGNOSIS — I519 Heart disease, unspecified: Secondary | ICD-10-CM

## 2012-11-06 DIAGNOSIS — D649 Anemia, unspecified: Secondary | ICD-10-CM

## 2012-11-06 DIAGNOSIS — I959 Hypotension, unspecified: Secondary | ICD-10-CM

## 2012-11-06 DIAGNOSIS — R0602 Shortness of breath: Secondary | ICD-10-CM

## 2012-11-06 DIAGNOSIS — I5189 Other ill-defined heart diseases: Secondary | ICD-10-CM

## 2012-11-06 LAB — HEMOGLOBIN AND HEMATOCRIT, BLOOD
HCT: 32 % — ABNORMAL LOW (ref 36.0–46.0)
Hemoglobin: 10.8 g/dL — ABNORMAL LOW (ref 12.0–15.0)

## 2012-11-06 LAB — COMPREHENSIVE METABOLIC PANEL
ALT: 29 U/L (ref 0–35)
AST: 33 U/L (ref 0–37)
Albumin: 3.8 g/dL (ref 3.5–5.2)
Alkaline Phosphatase: 49 U/L (ref 39–117)
BUN: 18 mg/dL (ref 6–23)
CO2: 26 mEq/L (ref 19–32)
Calcium: 9.3 mg/dL (ref 8.4–10.5)
Chloride: 105 mEq/L (ref 96–112)
Creatinine, Ser: 0.8 mg/dL (ref 0.4–1.2)
GFR: 73.87 mL/min (ref >60.00)
Glucose, Bld: 203 mg/dL — ABNORMAL HIGH (ref 70–99)
Potassium: 4.8 mEq/L (ref 3.5–5.1)
Sodium: 140 mEq/L (ref 135–145)
Total Bilirubin: 0.6 mg/dL (ref 0.3–1.2)
Total Protein: 7 g/dL (ref 6.0–8.3)

## 2012-11-06 LAB — BRAIN NATRIURETIC PEPTIDE: Pro B Natriuretic peptide (BNP): 216 pg/mL — ABNORMAL HIGH (ref 0.0–100.0)

## 2012-11-07 ENCOUNTER — Telehealth: Payer: Self-pay

## 2012-11-07 NOTE — Telephone Encounter (Signed)
Patient notified

## 2012-11-07 NOTE — Telephone Encounter (Signed)
She should double her lasix for 3 days. If the weight doesn't come down she will need to see her cardiologist.

## 2012-11-07 NOTE — Telephone Encounter (Signed)
Phone call from patient stating she was to call and inform you of her weight fluctuation. She said Sundays weight was fine but she is up 5 lbs today. She was in the hospital for 4 days for CHF please advise.

## 2012-11-09 ENCOUNTER — Other Ambulatory Visit: Payer: Self-pay | Admitting: Internal Medicine

## 2012-11-10 NOTE — Telephone Encounter (Signed)
Zolpidem called to pharmacy  

## 2012-11-11 ENCOUNTER — Encounter: Payer: Self-pay | Admitting: Pharmacist Clinician (PhC)/ Clinical Pharmacy Specialist

## 2012-11-13 ENCOUNTER — Encounter: Payer: Self-pay | Admitting: Cardiovascular Disease

## 2012-11-13 ENCOUNTER — Ambulatory Visit (INDEPENDENT_AMBULATORY_CARE_PROVIDER_SITE_OTHER): Payer: Medicare Other | Admitting: Cardiovascular Disease

## 2012-11-13 ENCOUNTER — Ambulatory Visit (INDEPENDENT_AMBULATORY_CARE_PROVIDER_SITE_OTHER): Payer: Medicare Other | Admitting: Pharmacist Clinician (PhC)/ Clinical Pharmacy Specialist

## 2012-11-13 VITALS — BP 110/56 | HR 104 | Ht 62.0 in | Wt 191.9 lb

## 2012-11-13 DIAGNOSIS — Z952 Presence of prosthetic heart valve: Secondary | ICD-10-CM

## 2012-11-13 DIAGNOSIS — I5032 Chronic diastolic (congestive) heart failure: Secondary | ICD-10-CM

## 2012-11-13 DIAGNOSIS — I359 Nonrheumatic aortic valve disorder, unspecified: Secondary | ICD-10-CM

## 2012-11-13 DIAGNOSIS — E785 Hyperlipidemia, unspecified: Secondary | ICD-10-CM

## 2012-11-13 DIAGNOSIS — E119 Type 2 diabetes mellitus without complications: Secondary | ICD-10-CM

## 2012-11-13 DIAGNOSIS — Z954 Presence of other heart-valve replacement: Secondary | ICD-10-CM

## 2012-11-13 DIAGNOSIS — D509 Iron deficiency anemia, unspecified: Secondary | ICD-10-CM | POA: Diagnosis not present

## 2012-11-13 DIAGNOSIS — Z7901 Long term (current) use of anticoagulants: Secondary | ICD-10-CM | POA: Diagnosis not present

## 2012-11-13 DIAGNOSIS — I2510 Atherosclerotic heart disease of native coronary artery without angina pectoris: Secondary | ICD-10-CM

## 2012-11-13 LAB — POCT INR: INR: 3.6

## 2012-11-13 MED ORDER — METOPROLOL SUCCINATE ER 50 MG PO TB24: ORAL_TABLET | ORAL | Status: DC

## 2012-11-13 NOTE — Patient Instructions (Addendum)
Dr. Tresa Endo wants you to increase your metoprolol to 1.5 tabs each morning and 1 tab each evening for 1 week, then increase to 1.5 tablets twice daily.  A new prescription was sent to your pharmacy.    Your physician wants you to follow-up in: 3 months You will receive a reminder letter in the mail two months in advance. If you don't receive a letter, please call our office to schedule the follow-up appointment.

## 2012-11-14 DIAGNOSIS — D249 Benign neoplasm of unspecified breast: Secondary | ICD-10-CM | POA: Diagnosis not present

## 2012-11-14 DIAGNOSIS — Z09 Encounter for follow-up examination after completed treatment for conditions other than malignant neoplasm: Secondary | ICD-10-CM | POA: Diagnosis not present

## 2012-11-14 DIAGNOSIS — M949 Disorder of cartilage, unspecified: Secondary | ICD-10-CM | POA: Diagnosis not present

## 2012-11-14 DIAGNOSIS — M899 Disorder of bone, unspecified: Secondary | ICD-10-CM | POA: Diagnosis not present

## 2012-11-15 ENCOUNTER — Telehealth: Payer: Self-pay

## 2012-11-15 DIAGNOSIS — D509 Iron deficiency anemia, unspecified: Secondary | ICD-10-CM

## 2012-11-15 DIAGNOSIS — Z952 Presence of prosthetic heart valve: Secondary | ICD-10-CM

## 2012-11-15 DIAGNOSIS — I5189 Other ill-defined heart diseases: Secondary | ICD-10-CM

## 2012-11-15 NOTE — Telephone Encounter (Signed)
Pt called back to add request for hemtologist referral. Please advise.

## 2012-11-15 NOTE — Telephone Encounter (Signed)
Phone call from patient stating she was in the hospital and you referred her back to her cardiologist. She would like a referral to a different cardiologist because she is not pleased with her current one. Please advise.

## 2012-11-16 ENCOUNTER — Ambulatory Visit: Payer: Medicare Other | Admitting: Pharmacist Clinician (PhC)/ Clinical Pharmacy Specialist

## 2012-11-20 ENCOUNTER — Encounter: Payer: Self-pay | Admitting: Cardiovascular Disease

## 2012-11-20 NOTE — Telephone Encounter (Signed)
Referrals placed 

## 2012-11-20 NOTE — Progress Notes (Signed)
Patient ID: Abigail Oliver, female   DOB: 1946-05-20, 67 y.o.   MRN: [id number]     HPI: Abigail Oliver, is a 67 y.o. female presents to the office for six-month cardiology evaluation.   In June 2000 Abigail Oliver underwent St. Jude aortic valve replacement for severe calcific aortic stenosis at which time she also underwent CABG revascularization surgery with a RIMA graft placed for right coronary artery. Additional problems include a history of hypertension with grade 2 diastolic dysfunction, obstructive sleep apnea, hyperlipidemia, type 2 diabetes mellitus, as well as peripheral vascular disease and she is status post right SFA stent placement by Dr. Allyson Sabal. Spirits episodes of shortness of breath and palpitations and also has noticed some episodes of vague discomfort in her chest. On October 24, 2012 she underwent a repeat echo Doppler study which showed an ejection fraction in the range of 60-65%. She again had grade 2 diastolic dysfunction. Her St. Jude aortic valve is well seated. The mean gradient was 20 mm in peak gradient was 36 mm. This was consistent with prior echoes. Mild mitral annular calcification with mild MR. Normal RV size and systolic function. Apparently she had been recently hospitalized May 31 through 10/24/2012 by Dr. Arthur Holms and apparently underwent colonoscopy and  endoscopy and was found to have an adenomatous polyp by Dr. Elnoria Howard.  She did require Lovenox/Coumadin crossover prior to and after her procedure. Presently, Abigail Oliver feels well. Still notes at times her heart rate is fast. She denies chest pressure. She denies shortness of breath.   Past Medical History  Diagnosis Date  . Coronary atherosclerosis of native coronary artery   . Hyperlipidemia   . Chronic angle-closure glaucoma(365.23)   . Aortic valve disorders   . Peripheral vascular disease   . Type II diabetes mellitus   . Depression   . PONV (postoperative nausea and vomiting)     N&V  . CHF (congestive heart  failure)     diastolic heart failure  . GERD (gastroesophageal reflux disease)   . Neuromuscular disorder     neuropathy  . Arthritis     osteo  . Anemia   . Syncope 03/29/2012    carotid doppler - R ICA 50-69% reduction by velocities (low end); L ICA 0-49% reduction by velocities (low end); R and L subclavian arteries - <50% redcution; R and L vertebral arteries show normal antegrade flow  . Dizziness 02/09/2012    Holter monitor - sinus rhythm, no ventricular ectopic beats, no sulpraventricular ectopic beats noted  . OSA (obstructive sleep apnea)     Past Surgical History  Procedure Laterality Date  . Carpal tunnel release  1989    bilaterally  . Cholecystectomy  1990  . Peripheral arterial stent graft  09/20/11    right SFA  . Cardiac valve replacement  2000    aortic valve   . Laceration repair      right hand  . Neuroplasty / transposition ulnar nerve at elbow      right  . Tonsillectomy  1968  . Vaginal hysterectomy  1985    Fibroids  . Bilateral oophorectomy  1987  . Cesarean section  1969; 1971  . Breast biopsy  2012    left  . Lymph node biopsy  06/2011    "core needle on 5"  . Cataract extraction w/ intraocular lens  implant, bilateral  ~ 2007  . Refractive surgery  ` 2004    "for glaucoma"  . Needle biopsy  2009    "  on ankles for nerve damage"  . Esophagogastroduodenoscopy N/A 10/27/2012    Procedure: ESOPHAGOGASTRODUODENOSCOPY (EGD);  Surgeon: Theda Belfast, MD;  Location: [facility];  Service: Endoscopy;  Laterality: N/A;  . Colonoscopy N/A 10/27/2012    Procedure: COLONOSCOPY;  Surgeon: Theda Belfast, MD;  Location: [facility];  Service: Endoscopy;  Laterality: N/A;  . Doppler echocardiography  03/29/2012    EF >55%; mild concentric LVH; stage 1 diastolic dysfunction, elevated LV filling pressure, dilated LA; MAC mild MR; St Jude AVR peak and mean gradients of and ; transvalvular gradients have increased (prev 23 and 14 respectively)  .  Cardiovascular stress test  08/25/2011    R/L MV - EF 72%; no scintigraphic evidence of inducible MI; normal perfusionTID of 1.25 elevated - could indicate small vessle subendocardial ischemia; EKG NSR at 66, non diagnostic for ischemia  . Cardiac catheterization  01/14/1999    normal LV function, severe aortic stenosis; 80% and 70% stenosis in RCA; mild 20% distal norrowing in L main with 20% proximal LAD stenosis, 40% diagonal stenosis and 20% proximal circumflex stenosis  . Femoral artery stent  09/20/2011    6 x 40 Smart Nitinol self-expanding stent placed;  10/15/2031 -R SFA stent open and patent w/o evidence of restenosis  . Coronary artery bypass graft  2000    RIMA to RCA    Allergies  Allergen Reactions  . Sulfa Antibiotics   . Iodinated Diagnostic Agents Rash     Red rash after cardiac cath 1 wk ago, ? Contrast allergy, requires 13 hr prep now per [email or web address]    Current Outpatient Prescriptions  Medication Sig Dispense Refill  . Ascorbic Acid (VITAMIN C) 500 MG tablet Take 500 mg by mouth 2 (two) times daily.       Marland Kitchen aspirin EC 81 MG tablet Take 81 mg by mouth daily.      . calcium-vitamin D (CALCIUM 500+D) 500-200 MG-UNIT per tablet Take 1 tablet by mouth 2 (two) times daily.       . citalopram (CELEXA) 20 MG tablet TAKE 1 TABLET BY MOUTH DAILY  30 tablet  0  . cyclobenzaprine (FLEXERIL) 10 MG tablet Take 10 mg by mouth 3 (three) times daily as needed for muscle spasms.      . folic acid (FOLVITE) 1 MG tablet Take 1 mg by mouth daily.       Marland Kitchen latanoprost (XALATAN) 0.005 % ophthalmic solution Place 1 drop into both eyes at bedtime.      Marland Kitchen losartan (COZAAR) 25 MG tablet Take 1 by mouth daily      . metFORMIN (GLUCOPHAGE) 500 MG tablet TAKE 1 TABLET BY MOUTH TWICE DAILY WITH A MEAL  60 tablet  0  . metoprolol succinate (TOPROL-XL) 50 MG 24 hr tablet Take 1.5 tablets by mouth twice daily  90 tablet  6  . pantoprazole (PROTONIX) 40 MG tablet Take 1 tablet (40 mg total) by  mouth 2 (two) times daily.  60 tablet  1  . potassium chloride (K-DUR,KLOR-CON) 10 MEQ tablet Take 1 tablet (10 mEq total) by mouth 2 (two) times daily.  60 tablet  10  . rosuvastatin (CRESTOR) 10 MG tablet Take 1 tablet (10 mg total) by mouth at bedtime.  28 tablet  0  . tiZANidine (ZANAFLEX) 4 MG tablet Take 4 mg by mouth as needed.      . torsemide (DEMADEX) 20 MG tablet Take 20 mg by mouth 2 (two) times daily as needed.       Marland Kitchen  traMADol (ULTRAM) 50 MG tablet Take 1 tablet (50 mg total) by mouth every 8 (eight) hours as needed for pain.  100 tablet  1  . warfarin (COUMADIN) 3 MG tablet Take 3 mg by mouth daily. Take as directed per coumadin clinic      . zolpidem (AMBIEN) 10 MG tablet TAKE ONE TABLET BY MOUTH AT BEDTIME AS NEEDED FOR SLEEP  30 tablet  0  . glucose blood (ONE TOUCH ULTRA TEST) test strip Use as instructed once daily to test blood glucose. Dx: 250.0  100 each  12  . Lancets (ONETOUCH ULTRASOFT) lancets Use as instructed once daily to test blood glucose. Dx: 250.0  100 each  12   No current facility-administered medications for this visit.    Socially  she is married and has 2 children and one grandchild. She works as a Interior and spatial designer. No tobacco or alcohol use. She does not routinely exercise.  ROS is negative for fevers, chills or night sweats. She does note some occasional palpitations particularly when she gets anxious her pulse rate speeds up. She also notes some anxiety which does contribute to some shortness of breath. She denies chest pressure but at times has noticed some vague shooting chest discomfort. He doesn't fatigue. At times she does note some indigestion. She denies significant edema. She does have anemia.  Other system review is negative.  PE BP 110/56  Pulse 104  Ht 5\' 2"  (1.575 m)  Wt 191 lb 14.4 oz (87.045 kg)  BMI 35.09 kg/m2  General: Alert, oriented, no distress.  Skin: normal turgor, no rashes HEENT: Normocephalic, atraumatic. Pupils round and  reactive; sclera anicteric;no lid lag.  Nose without nasal septal hypertrophy Mouth/Parynx benign; Mallinpatti scale 2/3 Neck: No JVD, no carotid briuts Lungs: clear to ausculatation and percussion; no wheezing or rales Heart: RRR, s1 s2 normal; 2/ 6 systolic murmur in the aortic area compatible with her mechanical aortic valve replacement Abdomen: soft, nontender; no hepatosplenomehaly, BS+; abdominal aorta nontender and not dilated by palpation. Pulses 2+ Extremities: no clubbing cyanosis or edema, Homan's sign negative  Neurologic: grossly nonfocal  ECG: Sinus tachycardia 104 beats per minute.   LABS:  BMET    Component Value Date/Time   NA 140 11/06/2012 1219   K 4.8 11/06/2012 1219   CL 105 11/06/2012 1219   CO2 26 11/06/2012 1219   GLUCOSE 203* 11/06/2012 1219   BUN 18 11/06/2012 1219   CREATININE 0.8 11/06/2012 1219   CALCIUM 9.3 11/06/2012 1219   GFRNONAA 72* 10/24/2012 0410   GFRAA 84* 10/24/2012 0410     Hepatic Function Panel     Component Value Date/Time   PROT 7.0 11/06/2012 1219   ALBUMIN 3.8 11/06/2012 1219   AST 33 11/06/2012 1219   ALT 29 11/06/2012 1219   ALKPHOS 49 11/06/2012 1219   BILITOT 0.6 11/06/2012 1219   BILIDIR 0.2 09/11/2012 1531     CBC    Component Value Date/Time   WBC 7.6 10/24/2012 0410   RBC 3.78* 10/24/2012 0410   HGB 10.8* 11/06/2012 1219   HCT 32.0* 11/06/2012 1219   PLT 111* 10/24/2012 0410   MCV 88.1 10/24/2012 0410   MCH 28.3 10/24/2012 0410   MCHC 32.1 10/24/2012 0410   RDW 16.3* 10/24/2012 0410   LYMPHSABS 1.8 10/21/2012 1005   MONOABS 0.3 10/21/2012 1005   EOSABS 0.1 10/21/2012 1005   BASOSABS 0.0 10/21/2012 1005     BNP    Component Value Date/Time   PROBNP 216.0*  11/06/2012 1219    Lipid Panel     Component Value Date/Time   CHOL 137 09/11/2012 1531   TRIG 114.0 09/11/2012 1531   HDL 40.40 09/11/2012 1531   CHOLHDL 3 09/11/2012 1531   VLDL 22.8 09/11/2012 1531   LDLCALC 74 09/11/2012 1531     RADIOLOGY: Dg Chest 2 View  10/23/2012    *RADIOLOGY REPORT*  Clinical Data: Shortness of breath.  CHEST - 2 VIEW  Comparison: 10/21/2012.  Findings: Trachea is midline.  Heart size stable.  Mild diffuse interstitial prominence and indistinctness, new.  Probable tiny bilateral pleural effusions. Biapical pleural thickening.  IMPRESSION: Congestive heart failure.   Original Report Authenticated By: Leanna Battles, M.D.   Dg Chest 2 View  10/21/2012   *RADIOLOGY REPORT*  Clinical Data: Dizziness and weakness  CHEST - 2 VIEW  Comparison: 02/07/2012  Findings: Changes from cardiac surgery and valve replacement are stable.  The cardiac silhouette is mildly enlarged.  No mediastinal or hilar masses.  The lungs are clear.  No pleural effusion or pneumothorax.  The bony thorax is demineralized but intact.  IMPRESSION: No acute cardiopulmonary disease.   Original Report Authenticated By: Amie Portland, M.D.   Ct Abdomen Pelvis W Contrast  10/22/2012   *RADIOLOGY REPORT*  Clinical Data: Dizziness, weakness.  CT ABDOMEN AND PELVIS WITH CONTRAST  Technique:  Multidetector CT imaging of the abdomen and pelvis was performed following the standard protocol during bolus administration of intravenous contrast.  Contrast: OMNIPAQUE IOHEXOL 300 MG/ML  SOLN  Comparison: 05/01/2010  Findings: Tiny nodular opacity periphery right middle lobe is unchanged and therefore nonaggressive.  The lung bases otherwise predominately clear.  Normal heart size.  Coronary artery calcification.  Hepatic steatosis and mildly lobular contour.  Cholecystectomy clips. No biliary ductal dilatation.  Unremarkable spleen, pancreas, adrenal glands, kidneys.  No hydronephrosis or hydroureter.  No CT evidence for colitis.  Small bowel loops are normal in course and caliber.  Tiny hiatal hernia.  Appendix not visualized.  No right lower quadrant inflammation.  No free intraperitoneal air or fluid.  No lymphadenopathy.  Thin-walled bladder.  Absent uterus.  No adnexal mass.  There is scattered  atherosclerotic calcification of the aorta and its branches. No aneurysmal dilatation.  L2 vertebral body lesion is likely a hemangioma.  No acute osseous finding.  Mild multilevel degenerative changes.  IMPRESSION: No aneurysmal dilatation of the aorta.  Mild scattered atherosclerotic disease.  Hepatic steatosis and questionable mild cirrhotic change. Correlate with LFTs and risk factors.  No acute abdominopelvic process.   Original Report Authenticated By: Jearld Lesch, M.D.   Dg Esophagus  10/24/2012   *RADIOLOGY REPORT*  Clinical Data: Difficulty swallowing.  ESOPHOGRAM/BARIUM SWALLOW  Technique:  Combined double contrast and single contrast examination performed using effervescent crystals, thick barium liquid, and thin barium liquid.  Fluoroscopy time:  2 minutes, 39 seconds.  Comparison:  CT chest 10/13/2011 reviewed.  Findings:  The pharynx appears normal.  No aspiration or penetration occurred.  The esophagus demonstrates normal caliber and mucosa throughout without stricture, mass or evidence of inflammatory change.  There is no hiatal hernia.  Esophageal motility is unremarkable.  No gastroesophageal reflux was elicited on the study.  A 13 mm barium tablet transiently lodged at the gastroesophageal junction but passed into the stomach with a swallow of barium.  IMPRESSION: 13 mm barium tablet transiently lodged at the gastroesophageal junction but passed into the stomach with a swallow of barium.  The patient did not report  any discomfort.  The study is otherwise negative.   Original Report Authenticated By: Holley Dexter, M.D.      ASSESSMENT AND PLAN: Abigail Oliver is now 14 years status post St. Jude aortic valve replacement for severe calcific aortic stenosis which time she underwent single-vessel CABG revascularization surgery to her right carotid artery. Did review her recent hospitalization data and discuss with her recent echo Doppler study which continued to show normal systolic and a  grade 2 diastolic dysfunction may be contributory to some of her shortness of breath. Resume significant change in her valve gradients. She is tachycardic. I am further titrating her Toprol which he currently has been taking the 50 mg twice a day regimen that'll increase this to 75 mg in the morning and 50 mg at night for one week then increase this to 75 mg twice a day. I will see her back in the office in 3 months for followup evaluation.     Lennette Bihari, MD, [facility]  11/20/2012 9:41 AM

## 2012-11-22 DIAGNOSIS — R42 Dizziness and giddiness: Secondary | ICD-10-CM | POA: Diagnosis not present

## 2012-11-22 DIAGNOSIS — D509 Iron deficiency anemia, unspecified: Secondary | ICD-10-CM

## 2012-11-22 DIAGNOSIS — D649 Anemia, unspecified: Secondary | ICD-10-CM | POA: Diagnosis not present

## 2012-11-22 DIAGNOSIS — I959 Hypotension, unspecified: Secondary | ICD-10-CM

## 2012-11-22 DIAGNOSIS — R55 Syncope and collapse: Secondary | ICD-10-CM

## 2012-11-22 DIAGNOSIS — R0602 Shortness of breath: Secondary | ICD-10-CM

## 2012-11-22 DIAGNOSIS — E119 Type 2 diabetes mellitus without complications: Secondary | ICD-10-CM

## 2012-11-22 DIAGNOSIS — I519 Heart disease, unspecified: Secondary | ICD-10-CM

## 2012-11-22 DIAGNOSIS — I359 Nonrheumatic aortic valve disorder, unspecified: Secondary | ICD-10-CM

## 2012-11-23 ENCOUNTER — Telehealth: Payer: Self-pay | Admitting: Oncology

## 2012-11-23 NOTE — Telephone Encounter (Signed)
S/W PT IN RE NP APPT 07/31 @ 10:30 W/DR. SHADAD REFERRING DR. Illene Regulus DX-IDA WELCOME PACKET MAILED.

## 2012-11-27 ENCOUNTER — Telehealth: Payer: Self-pay | Admitting: Oncology

## 2012-11-27 ENCOUNTER — Encounter: Payer: Self-pay | Admitting: Internal Medicine

## 2012-11-27 NOTE — Telephone Encounter (Signed)
C/D 11/27/12 for appt. 12/21/12

## 2012-12-04 ENCOUNTER — Ambulatory Visit: Payer: Medicare Other | Admitting: Pharmacist Clinician (PhC)/ Clinical Pharmacy Specialist

## 2012-12-15 ENCOUNTER — Other Ambulatory Visit: Payer: Self-pay | Admitting: Oncology

## 2012-12-15 DIAGNOSIS — D509 Iron deficiency anemia, unspecified: Secondary | ICD-10-CM

## 2012-12-18 ENCOUNTER — Ambulatory Visit (INDEPENDENT_AMBULATORY_CARE_PROVIDER_SITE_OTHER): Payer: Medicare Other | Admitting: Cardiology

## 2012-12-18 ENCOUNTER — Encounter: Payer: Self-pay | Admitting: Cardiology

## 2012-12-18 VITALS — BP 112/58 | HR 78 | Ht 62.0 in | Wt 193.4 lb

## 2012-12-18 DIAGNOSIS — R42 Dizziness and giddiness: Secondary | ICD-10-CM

## 2012-12-18 DIAGNOSIS — I2510 Atherosclerotic heart disease of native coronary artery without angina pectoris: Secondary | ICD-10-CM

## 2012-12-18 DIAGNOSIS — I5032 Chronic diastolic (congestive) heart failure: Secondary | ICD-10-CM | POA: Diagnosis not present

## 2012-12-18 DIAGNOSIS — I5189 Other ill-defined heart diseases: Secondary | ICD-10-CM

## 2012-12-18 DIAGNOSIS — E785 Hyperlipidemia, unspecified: Secondary | ICD-10-CM

## 2012-12-18 DIAGNOSIS — Z954 Presence of other heart-valve replacement: Secondary | ICD-10-CM

## 2012-12-18 DIAGNOSIS — Z952 Presence of prosthetic heart valve: Secondary | ICD-10-CM

## 2012-12-18 DIAGNOSIS — R0602 Shortness of breath: Secondary | ICD-10-CM

## 2012-12-18 DIAGNOSIS — I519 Heart disease, unspecified: Secondary | ICD-10-CM

## 2012-12-18 MED ORDER — POTASSIUM CHLORIDE CRYS ER 10 MEQ PO TBCR: EXTENDED_RELEASE_TABLET | ORAL | Status: DC

## 2012-12-18 MED ORDER — TORSEMIDE 20 MG PO TABS: ORAL_TABLET | ORAL | Status: DC

## 2012-12-18 NOTE — Patient Instructions (Addendum)
Stop losartan.   Increase torsemide to 40mg  in the AM and 20mg  in the PM. This will be 2 of your 20mg  tablets in the AM and 1 of your 20mg  tablets in the PM.  Increase KCL(potassium) to 20 mEq in the AM and 10 mEq in the PM. This will be 2 of your 10 mEq tablets in the AM and 1 of your 10 mEq tablets in the PM.  Your physician recommends that you return for a FASTING lipid profile /BMET/BNP in 10 days.   Your physician has recommended that you wear a holter monitor. Holter monitors are medical devices that record the heart's electrical activity. Doctors most often use these monitors to diagnose arrhythmias. Arrhythmias are problems with the speed or rhythm of the heartbeat. The monitor is a small, portable device. You can wear one while you do your normal daily activities. This is usually used to diagnose what is causing palpitations/syncope (passing out). 48 hour    Your physician recommends that you schedule a follow-up appointment in: 1 month with Dr Shirlee Latch.

## 2012-12-20 NOTE — Progress Notes (Signed)
Patient ID: Abigail Oliver, female   DOB: Jul 29, 1945, 67 y.o.   MRN: [id number] PCP: Dr. Debby Bud  67 yo with history of aortic stenosis s/p St Jude mechanical AVR (6/00) and CAD s/p single vessel CABG (6/00) presents for cardiology evaluation.  She has been seen by Dr. Tresa Endo in the past and is seen by me for the first time today.  She had severe aortic stenosis and therefore required CABG-AVR in 6/00.  She had a RIMA-RCA. She had a myoview in 4/13 with no ischemia or infarction, and echo in 6/14 showd normal EF with moderate diastolic dysfunction and a well-seated mechanical valve.    Patient was hospitalized in 6/14 with presyncope and anemia.  Hemoglobin was below baseline but she did not require transfusion.  She was orthostatic.  She was given IV fluid but developed CHF and had to be diuresed.  She was seen by GI and ended up having EGD and c-scope.  Per the patient, EGD showed gastritis.   Currently, she still has some lightheadedness when she first stands up.  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  She also gets short of breath after walking about 50 feet.  Dyspnea has been worse for about 3 months.  She has dyspnea with steps and also bendopnea.  No chest pain.  She is taking torsemide 20 mg bid.   ECG (6/14): NSR, inferior T wave flattening  Labs (6/14): BNP 216, HCT 32, K 4.8, creatinine 0.8  PMH: 1. Aortic stenosis: Now has St Jude mechanical aortic valve (6/00).  Echo (6/14) with EF 60-65%, mild LVH, moderate diastolic dysfunction, normal RV, mechanical aortic valve with mean gradient 20 mmHg.  2. CAD: CABG at time of AVR in 6/00 with RIMA-RCA.  Myoview in 4/13 with EF 72%, no ischemia or infarction.  3. HTN 4. Type II diabetes 5. GERD 6. OSA 7. Hyperlipidemia 8. PAD: Right SFA stent.  9. Colon polyps 10. Carotid stenosis: Carotid dopplers (11/13) witih 50-69% RICA.  11. Diastolic CHF 12. Gastritis  SH: Married, works as  Interior and spatial designer, nonsmoker.   FH: CAD  ROS: All systems reviewed and negative except as per HPI.   Current Outpatient Prescriptions  Medication Sig Dispense Refill  . Ascorbic Acid (VITAMIN C) 500 MG tablet Take 500 mg by mouth 2 (two) times daily.       Marland Kitchen aspirin EC 81 MG tablet Take 81 mg by mouth daily.      . calcium-vitamin D (CALCIUM 500+D) 500-200 MG-UNIT per tablet Take 1 tablet by mouth 2 (two) times daily.       . cyclobenzaprine (FLEXERIL) 10 MG tablet Take 10 mg by mouth 3 (three) times daily as needed for muscle spasms.      . folic acid (FOLVITE) 1 MG tablet Take 1 mg by mouth daily.       Marland Kitchen latanoprost (XALATAN) 0.005 % ophthalmic solution Place 1 drop into both eyes at bedtime.      . metFORMIN (GLUCOPHAGE) 500 MG tablet TAKE 1 TABLET BY MOUTH TWICE DAILY WITH A MEAL  60 tablet  0  . metoprolol succinate (TOPROL-XL) 50 MG 24 hr tablet Take 1.5 tablets by mouth twice daily  90 tablet  6  . pantoprazole (PROTONIX) 40 MG tablet Take 40 mg by mouth 2 (two) times daily as needed.      . rosuvastatin (CRESTOR) 10 MG tablet Take 1 tablet (10 mg total) by mouth at bedtime.  28 tablet  0  . traMADol (ULTRAM) 50 MG tablet Take 1 tablet (50 mg total) by mouth every 8 (eight) hours as needed for pain.  100 tablet  1  . warfarin (COUMADIN) 3 MG tablet Take 3 mg by mouth daily. Take as directed per coumadin clinic      . zolpidem (AMBIEN) 10 MG tablet TAKE ONE TABLET BY MOUTH AT BEDTIME AS NEEDED FOR SLEEP  30 tablet  0  . potassium chloride (K-DUR,KLOR-CON) 10 MEQ tablet 2 tablets (total 20 mEq) in the AM and 1 tablet (total 10 mEq ) in the PM  90 tablet  3  . tiZANidine (ZANAFLEX) 4 MG tablet Take 4 mg by mouth as needed.      . torsemide (DEMADEX) 20 MG tablet 2 tablets (total 40mg ) in the AM and 1 tablet (total 20mg ) in the PM  90 tablet  3   No current facility-administered medications for this visit.    BP 112/58  Pulse 78  Ht 5\' 2"  (1.575 m)  Wt 87.726 kg (193 lb 6.4 oz)  BMI  35.36 kg/m2  SpO2 97% General: NAD Neck: JVP 8-9 cm, no thyromegaly or thyroid nodule.  Lungs: Clear to auscultation bilaterally with normal respiratory effort. CV: Nondisplaced PMI.  Heart regular S1/S2, mechanical S2, no S3/S4, 2/6 SEM RUSB.  Trace ankle edema.  No carotid bruit.  Normal pedal pulses.  Abdomen: Soft, nontender, no hepatosplenomegaly, no distention.  Skin: Intact without lesions or rashes.  Neurologic: Alert and oriented x 3.  Psych: Normal affect. Extremities: No clubbing or cyanosis.  HEENT: Normal.   Assessment/Plan: 1. St Jude mechanical aortic valve: Well-seated on echo in 6/14.  Continue coumadin and ASA 81 mg daily.  2. Chronic diastolic CHF: Patient does appear mildly volume overloaded.  I am going to increase her torsemide gently to 40 qam, 20 qpm.  I will also increase KCl to 20 qam, 10 qpm.  She will get BMET/BNP in 10 days.  3. Dizzy spells: These seem orthostatic.   - 48 hour holter monitor to rule out arrhythmic etiology. - Stop losartan to see if her BP rises a bit and symptoms improve. - Talked about always standing up slowly if she is seated or supine.  4. CAD: s/p RIMA-RCA at time of AVR.  Continue ASA 81 and statin.  5. Hyperlipidemia: Will check lipids with labs.  She is on a statin.   Marca Ancona 12/20/2012

## 2012-12-21 ENCOUNTER — Other Ambulatory Visit (HOSPITAL_BASED_OUTPATIENT_CLINIC_OR_DEPARTMENT_OTHER): Payer: Medicare Other | Admitting: Lab

## 2012-12-21 ENCOUNTER — Ambulatory Visit (HOSPITAL_BASED_OUTPATIENT_CLINIC_OR_DEPARTMENT_OTHER): Payer: Medicare Other | Admitting: Oncology

## 2012-12-21 ENCOUNTER — Ambulatory Visit: Payer: Medicare Other

## 2012-12-21 ENCOUNTER — Encounter: Payer: Self-pay | Admitting: Oncology

## 2012-12-21 VITALS — BP 137/61 | HR 74 | Temp 97.5°F | Resp 19 | Ht 62.0 in | Wt 193.6 lb

## 2012-12-21 DIAGNOSIS — D509 Iron deficiency anemia, unspecified: Secondary | ICD-10-CM

## 2012-12-21 DIAGNOSIS — D539 Nutritional anemia, unspecified: Secondary | ICD-10-CM

## 2012-12-21 LAB — COMPREHENSIVE METABOLIC PANEL (CC13)
ALT: 40 U/L (ref 0–55)
AST: 49 U/L — ABNORMAL HIGH (ref 5–34)
Albumin: 4.4 g/dL (ref 3.5–5.0)
Alkaline Phosphatase: 82 U/L (ref 40–150)
BUN: 28.5 mg/dL — ABNORMAL HIGH (ref 7.0–26.0)
CO2: 28 mEq/L (ref 22–29)
Calcium: 10.1 mg/dL (ref 8.4–10.4)
Chloride: 96 mEq/L — ABNORMAL LOW (ref 98–109)
Creatinine: 1.1 mg/dL (ref 0.6–1.1)
Glucose: 313 mg/dl — ABNORMAL HIGH (ref 70–140)
Potassium: 3.9 mEq/L (ref 3.5–5.1)
Sodium: 138 mEq/L (ref 136–145)
Total Bilirubin: 1.33 mg/dL — ABNORMAL HIGH (ref 0.20–1.20)
Total Protein: 8.4 g/dL — ABNORMAL HIGH (ref 6.4–8.3)

## 2012-12-21 LAB — CBC WITH DIFFERENTIAL/PLATELET
BASO%: 1.4 % (ref 0.0–2.0)
Basophils Absolute: 0.1 10*3/uL (ref 0.0–0.1)
EOS%: 1.4 % (ref 0.0–7.0)
Eosinophils Absolute: 0.1 10*3/uL (ref 0.0–0.5)
HCT: 37.5 % (ref 34.8–46.6)
HGB: 12.7 g/dL (ref 11.6–15.9)
LYMPH%: 32 % (ref 14.0–49.7)
MCH: 28.7 pg (ref 25.1–34.0)
MCHC: 33.8 g/dL (ref 31.5–36.0)
MCV: 85.1 fL (ref 79.5–101.0)
MONO#: 0.5 10*3/uL (ref 0.1–0.9)
MONO%: 8.5 % (ref 0.0–14.0)
NEUT#: 3.4 10*3/uL (ref 1.5–6.5)
NEUT%: 56.7 % (ref 38.4–76.8)
Platelets: 198 10*3/uL (ref 145–400)
RBC: 4.4 10*6/uL (ref 3.70–5.45)
RDW: 16.3 % — ABNORMAL HIGH (ref 11.2–14.5)
WBC: 6.1 10*3/uL (ref 3.9–10.3)
lymph#: 1.9 10*3/uL (ref 0.9–3.3)

## 2012-12-21 LAB — IRON AND TIBC CHCC
%SAT: 18 % — ABNORMAL LOW (ref 21–57)
Iron: 92 ug/dL (ref 41–142)
TIBC: 503 ug/dL — ABNORMAL HIGH (ref 236–444)
UIBC: 411 ug/dL — ABNORMAL HIGH (ref 120–384)

## 2012-12-21 LAB — CHCC SMEAR

## 2012-12-21 LAB — FERRITIN CHCC: Ferritin: 67 ng/ml (ref 9–269)

## 2012-12-21 NOTE — Progress Notes (Signed)
Reason for Referral: Anemia.   HPI: Abigail Oliver is a pleasant 67 year old woman native of [place] that have been living in [place] for the majority of her adult life. She is a woman with a past medical history significant for coronary artery disease and cardiomyopathy would've been in reasonable health toe she was hospitalized in June of 2014. She was admitted on 10/21/2012 with symptoms of dizziness and presyncope and found to be hypotensive as well as anemic. She was hydrated aggressively and her GI workup including EGD and a colonoscopy did not show any evidence of malignancy or polyps but did show evidence of gastritis. Patient was discharged on 10/24/2012 and have been slowly recovering. During her hospital stay, her hemoglobin had drifted down to as low as 10.2 with a normal MCV and a white cell count of 7.6. Her platelet counts did drop down to 111,000. She did not require any packed red cell transfusions and her iron studies showed a normal iron of 58 the iron saturations of 14% which is low. After her discharge, she had been slowly recovering and her energy has improved. She is not taking any iron supplements and have resumed most of her work related to disease without any problems. Her appetite is not back to normal she is able to eat but not as she was eating before. She has not reported any bleeding since her discharge. She had not reported any hemoptysis or hematemesis. Did not report any hematochezia or melena. Did report some occasional epistaxis but very minimal in nature. She has really no other constitutional symptoms. 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   Past Medical History  Diagnosis Date  . Coronary atherosclerosis of native coronary artery   . Hyperlipidemia   . Chronic angle-closure glaucoma(365.23)   . Aortic valve disorders   . Peripheral vascular disease    . Type II diabetes mellitus   . Depression   . PONV (postoperative nausea and vomiting)     N&V  . CHF (congestive heart failure)     diastolic heart failure  . GERD (gastroesophageal reflux disease)   . Neuromuscular disorder     neuropathy  . Arthritis     osteo  . Anemia   . Syncope 03/29/2012    carotid doppler - R ICA 50-69% reduction by velocities (low end); L ICA 0-49% reduction by velocities (low end); R and L subclavian arteries - <50% redcution; R and L vertebral arteries show normal antegrade flow  . Dizziness 02/09/2012    Holter monitor - sinus rhythm, no ventricular ectopic beats, no sulpraventricular ectopic beats noted  . OSA (obstructive sleep apnea)   :  Past Surgical History  Procedure Laterality Date  . Carpal tunnel release  1989    bilaterally  . Cholecystectomy  1990  . Peripheral arterial stent graft  09/20/11    right SFA  . Cardiac valve replacement  2000    aortic valve   . Laceration repair      right hand  . Neuroplasty / transposition ulnar nerve at elbow      right  . Tonsillectomy  1968  . Vaginal hysterectomy  1985    Fibroids  . Bilateral oophorectomy  1987  . Cesarean section  1969; 1971  . Breast biopsy  2012    left  . Lymph node biopsy  06/2011    "  core needle on 5"  . Cataract extraction w/ intraocular lens  implant, bilateral  ~ 2007  . Refractive surgery  ` 2004    "for glaucoma"  . Needle biopsy  2009    "on ankles for nerve damage"  . Esophagogastroduodenoscopy N/A 10/27/2012    Procedure: ESOPHAGOGASTRODUODENOSCOPY (EGD);  Surgeon: Theda Belfast, MD;  Location: [facility];  Service: Endoscopy;  Laterality: N/A;  . Colonoscopy N/A 10/27/2012    Procedure: COLONOSCOPY;  Surgeon: Theda Belfast, MD;  Location: [facility];  Service: Endoscopy;  Laterality: N/A;  . Doppler echocardiography  03/29/2012    EF >55%; mild concentric LVH; stage 1 diastolic dysfunction, elevated LV filling pressure, dilated LA; MAC mild MR; St Jude AVR  peak and mean gradients of and ; transvalvular gradients have increased (prev 23 and 14 respectively)  . Cardiovascular stress test  08/25/2011    R/L MV - EF 72%; no scintigraphic evidence of inducible MI; normal perfusionTID of 1.25 elevated - could indicate small vessle subendocardial ischemia; EKG NSR at 66, non diagnostic for ischemia  . Cardiac catheterization  01/14/1999    normal LV function, severe aortic stenosis; 80% and 70% stenosis in RCA; mild 20% distal norrowing in L main with 20% proximal LAD stenosis, 40% diagonal stenosis and 20% proximal circumflex stenosis  . Femoral artery stent  09/20/2011    6 x 40 Smart Nitinol self-expanding stent placed;  10/15/2031 -R SFA stent open and patent w/o evidence of restenosis  . Coronary artery bypass graft  2000    RIMA to RCA  :  Current Outpatient Prescriptions  Medication Sig Dispense Refill  . Ascorbic Acid (VITAMIN C) 500 MG tablet Take 500 mg by mouth 2 (two) times daily.       Marland Kitchen aspirin EC 81 MG tablet Take 81 mg by mouth daily.      . calcium-vitamin D (CALCIUM 500+D) 500-200 MG-UNIT per tablet Take 1 tablet by mouth 2 (two) times daily.       . cyclobenzaprine (FLEXERIL) 10 MG tablet Take 10 mg by mouth 3 (three) times daily as needed for muscle spasms.      . folic acid (FOLVITE) 1 MG tablet Take 1 mg by mouth daily.       Marland Kitchen latanoprost (XALATAN) 0.005 % ophthalmic solution Place 1 drop into both eyes at bedtime.      . metFORMIN (GLUCOPHAGE) 500 MG tablet TAKE 1 TABLET BY MOUTH TWICE DAILY WITH A MEAL  60 tablet  0  . metoprolol succinate (TOPROL-XL) 50 MG 24 hr tablet Take 1.5 tablets by mouth twice daily  90 tablet  6  . pantoprazole (PROTONIX) 40 MG tablet Take 40 mg by mouth 2 (two) times daily as needed.      . potassium chloride (K-DUR,KLOR-CON) 10 MEQ tablet 2 tablets (total 20 mEq) in the AM and 1 tablet (total 10 mEq ) in the PM  90 tablet  3  . rosuvastatin (CRESTOR) 10 MG tablet Take 1 tablet (10 mg total)  by mouth at bedtime.  28 tablet  0  . tiZANidine (ZANAFLEX) 4 MG tablet Take 4 mg by mouth as needed.      . torsemide (DEMADEX) 20 MG tablet 2 tablets (total 40mg ) in the AM and 1 tablet (total 20mg ) in the PM  90 tablet  3  . traMADol (ULTRAM) 50 MG tablet Take 1 tablet (50 mg total) by mouth every 8 (eight) hours as needed for pain.  100 tablet  1  . warfarin (COUMADIN) 3 MG tablet Take 3 mg by mouth daily. Take as directed per coumadin clinic      . zolpidem (AMBIEN) 10 MG tablet TAKE ONE TABLET BY MOUTH AT BEDTIME AS NEEDED FOR SLEEP  30 tablet  0  . citalopram (CELEXA) 20 MG tablet Take 20 mg by mouth daily.       No current facility-administered medications for this visit.       Allergies  Allergen Reactions  . Sulfa Antibiotics   . Iodinated Diagnostic Agents Rash     Red rash after cardiac cath 1 wk ago, ? Contrast allergy, requires 13 hr prep now per [email or web address]  :  Family History  Problem Relation Age of Onset  . Cancer Mother     Colon  . COPD Mother   . Emphysema Mother   . Diabetes Neg Hx   . Cancer Maternal Grandmother   . Cancer Maternal Grandfather   . Heart disease Brother   :  History   Social History  . Marital Status: Married    Spouse Name: N/A    Number of Children: N/A  . Years of Education: N/A   Occupational History  . Not on file.   Social History Main Topics  . Smoking status: Never Smoker   . Smokeless tobacco: Never Used  . Alcohol Use: No  . Drug Use: No  . Sexually Active: No   Other Topics Concern  . Not on file   Social History Narrative   Pt is a high school graduate with 2 years of college. Married in 1967 she has 1 son born 27 and 1 daughter born 89 and 1 grandchild. 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  :  A comprehensive review of systems was negative.  Exam:ECOG 1 Blood pressure 137/61, pulse 74, temperature 97.5 F (36.4 C), temperature source Oral, resp. rate 19, height  5\' 2"  (1.575 m), weight 193 lb 9.6 oz (87.816 kg), SpO2 98.00%. General appearance: alert and cooperative Head: Normocephalic, without obvious abnormality, atraumatic Nose: Nares normal. Septum midline. Mucosa normal. No drainage or sinus tenderness. Throat: lips, mucosa, and tongue normal; teeth and gums normal Neck: no adenopathy, no carotid bruit, no JVD, supple, symmetrical, trachea midline and thyroid not enlarged, symmetric, no tenderness/mass/nodules Resp: clear to auscultation bilaterally Cardio: regular rate and rhythm, S1, S2 normal, no murmur, click, rub or gallop GI: soft, non-tender; bowel sounds normal; no masses,  no organomegaly Extremities: extremities normal, atraumatic, no cyanosis or edema Pulses: 2+ and symmetric   Recent Labs  12/21/12 1043  WBC 6.1  HGB 12.7  HCT 37.5  PLT 198    Recent Labs  12/21/12 1043  NA 138  K 3.9  CO2 28  GLUCOSE 313*  BUN 28.5*  CREATININE 1.1  CALCIUM 10.1     Blood smear review: Appeared normal without any abnormalities.    Assessment and Plan:   67 year old woman with the following issues:  1. Iron deficiency anemia that have subsequently resolved. This was noticed that during a brief hospitalization in June of 2014 where she presented with a hemoglobin of close to 10 and mild iron deficiency. Her GI workup was complete and did not show any evidence of malignancy including a CT scan as well as a colonoscopy and endoscopy. A repeat hemoglobin today is perfectly normal with a hemoglobin of 12.7 and a normal peripheral smear. I am checking her iron stores today for completeness  but in all likelihood he'll be close to normal range. I discussed with her the differential diagnosis would potentially could have happened back in June of 2014. It is very possible that she have developed gastritis over a period of time and and also she could have developed enteritis which have possibly caused difficulty absorbing iron over a period  of time and she had developed mild anemia which has spontaneously resolved at this time. I discussed with her the role of iron supplementation at this point with a will be in the form of by mouth iron or IV iron which I do not think is necessary at this point if she eats a balanced diet without ongoing GI blood loss which I do not think it's the case. For that reason, I have held off on recommending iron supplements unless she develops iron deficiency anemia.  2. Thrombocytopenia: It appeared to have resolved at this time she had normal platelet counts today and was probably related to acute illness and I do not think it indicates any hematological disorder.  I will be happy to see Abigail Oliver in the future for further recommendation our consultation if she develops any hematological abnormality in the future. I have given her my contact information and she will followup as needed.

## 2012-12-21 NOTE — Progress Notes (Signed)
Checked in new pt with no financial concerns. °

## 2012-12-25 ENCOUNTER — Other Ambulatory Visit: Payer: Self-pay | Admitting: Cardiology

## 2012-12-27 ENCOUNTER — Other Ambulatory Visit: Payer: Self-pay

## 2013-01-01 ENCOUNTER — Encounter: Payer: Self-pay | Admitting: *Deleted

## 2013-01-01 ENCOUNTER — Encounter (INDEPENDENT_AMBULATORY_CARE_PROVIDER_SITE_OTHER): Payer: Medicare Other

## 2013-01-01 ENCOUNTER — Ambulatory Visit (INDEPENDENT_AMBULATORY_CARE_PROVIDER_SITE_OTHER): Payer: Medicare Other | Admitting: *Deleted

## 2013-01-01 DIAGNOSIS — I5032 Chronic diastolic (congestive) heart failure: Secondary | ICD-10-CM

## 2013-01-01 DIAGNOSIS — Z954 Presence of other heart-valve replacement: Secondary | ICD-10-CM | POA: Diagnosis not present

## 2013-01-01 DIAGNOSIS — R0602 Shortness of breath: Secondary | ICD-10-CM | POA: Diagnosis not present

## 2013-01-01 DIAGNOSIS — Z952 Presence of prosthetic heart valve: Secondary | ICD-10-CM

## 2013-01-01 DIAGNOSIS — Z7901 Long term (current) use of anticoagulants: Secondary | ICD-10-CM | POA: Diagnosis not present

## 2013-01-01 DIAGNOSIS — R42 Dizziness and giddiness: Secondary | ICD-10-CM

## 2013-01-01 DIAGNOSIS — H409 Unspecified glaucoma: Secondary | ICD-10-CM | POA: Diagnosis not present

## 2013-01-01 DIAGNOSIS — I959 Hypotension, unspecified: Secondary | ICD-10-CM

## 2013-01-01 DIAGNOSIS — H4011X0 Primary open-angle glaucoma, stage unspecified: Secondary | ICD-10-CM | POA: Diagnosis not present

## 2013-01-01 LAB — LIPID PANEL
Cholesterol: 218 mg/dL — ABNORMAL HIGH (ref 0–200)
HDL: 44.6 mg/dL (ref >39.00)
Total CHOL/HDL Ratio: 5
Triglycerides: 217 mg/dL — ABNORMAL HIGH (ref 0.0–149.0)
VLDL: 43.4 mg/dL — ABNORMAL HIGH (ref 0.0–40.0)

## 2013-01-01 LAB — BASIC METABOLIC PANEL
BUN: 17 mg/dL (ref 6–23)
CO2: 30 mEq/L (ref 19–32)
Calcium: 9.4 mg/dL (ref 8.4–10.5)
Chloride: 101 mEq/L (ref 96–112)
Creatinine, Ser: 0.8 mg/dL (ref 0.4–1.2)
GFR: 74.89 mL/min (ref >60.00)
Glucose, Bld: 183 mg/dL — ABNORMAL HIGH (ref 70–99)
Potassium: 4.4 mEq/L (ref 3.5–5.1)
Sodium: 142 mEq/L (ref 135–145)

## 2013-01-01 LAB — LDL CHOLESTEROL, DIRECT: Direct LDL: 141 mg/dL

## 2013-01-01 LAB — BRAIN NATRIURETIC PEPTIDE: Pro B Natriuretic peptide (BNP): 55 pg/mL (ref 0.0–100.0)

## 2013-01-01 LAB — PROTIME-INR
INR: 2.7 ratio — ABNORMAL HIGH (ref 0.8–1.0)
Prothrombin Time: 28.1 s — ABNORMAL HIGH (ref 10.2–12.4)

## 2013-01-01 NOTE — Progress Notes (Signed)
Patient ID: Abigail Oliver, female   DOB: 1945/08/04, 67 y.o.   MRN: [id number] E-Cardio 48 hour holter monitor applied to patient.

## 2013-01-02 ENCOUNTER — Telehealth: Payer: Self-pay | Admitting: *Deleted

## 2013-01-02 ENCOUNTER — Ambulatory Visit (INDEPENDENT_AMBULATORY_CARE_PROVIDER_SITE_OTHER): Payer: Self-pay | Admitting: Pharmacist Clinician (PhC)/ Clinical Pharmacy Specialist

## 2013-01-02 DIAGNOSIS — I5032 Chronic diastolic (congestive) heart failure: Secondary | ICD-10-CM | POA: Insufficient documentation

## 2013-01-02 DIAGNOSIS — Z954 Presence of other heart-valve replacement: Secondary | ICD-10-CM

## 2013-01-02 DIAGNOSIS — Z7901 Long term (current) use of anticoagulants: Secondary | ICD-10-CM

## 2013-01-02 DIAGNOSIS — Z952 Presence of prosthetic heart valve: Secondary | ICD-10-CM

## 2013-01-02 NOTE — Telephone Encounter (Signed)
Message copied by Tarri Fuller on Tue Jan 02, 2013 11:40 AM ------      Message from: Laurey Morale      Created: Tue Jan 02, 2013  9:21 AM       LDL is quite high, is she taking statin? Would increase Crestor to 20 if so with lipids/LFTs in 2 months. ------

## 2013-01-02 NOTE — Telephone Encounter (Signed)
pt notified about lab results with verbal understanding; pt states to me that Dr. Tresa Endo had been giving her samples of crestor 10, but ran out about 1 month ago. I advised will d/w Dr. Shirlee Latch if he still wants to increase crestor or go back on 10 mg. Pt will need to pick up samples at the [facility]. Office. I w/cb pt after I hear from Dr. Shirlee Latch. Pt verbalized understanding

## 2013-01-04 ENCOUNTER — Telehealth: Payer: Self-pay | Admitting: Nurse Practitioner

## 2013-01-04 DIAGNOSIS — E785 Hyperlipidemia, unspecified: Secondary | ICD-10-CM

## 2013-01-04 MED ORDER — SIMVASTATIN 40 MG PO TABS: 40.0000 mg | ORAL_TABLET | Freq: Every day | ORAL | Status: DC

## 2013-01-04 NOTE — Telephone Encounter (Signed)
Message copied by Levi Aland on Thu Jan 04, 2013 11:54 AM ------      Message from: Laurey Morale      Created: Wed Jan 03, 2013 10:08 PM       Start simvastatin 40 mg daily with lipids/LFTs in 2 months. ------

## 2013-01-04 NOTE — Telephone Encounter (Signed)
Simvastatin 40 mg QD Rx sent to [facility]; patient aware and upcoming 8/25 appointment verified.  I advised that 2 month recheck of lipids could be ordered at that time as Dr. Shirlee Latch may want to order additional lab work at that time.

## 2013-01-08 ENCOUNTER — Telehealth: Payer: Self-pay | Admitting: *Deleted

## 2013-01-08 NOTE — Telephone Encounter (Signed)
Dr Shirlee Latch reviewed monitor done 01/02/13--NSR with PACs. OK. Pt notified.

## 2013-01-11 ENCOUNTER — Other Ambulatory Visit: Payer: Self-pay | Admitting: Internal Medicine

## 2013-01-15 ENCOUNTER — Encounter: Payer: Self-pay | Admitting: Cardiology

## 2013-01-15 ENCOUNTER — Ambulatory Visit (INDEPENDENT_AMBULATORY_CARE_PROVIDER_SITE_OTHER): Payer: Medicare Other | Admitting: Cardiology

## 2013-01-15 VITALS — BP 122/56 | HR 70 | Ht 62.0 in | Wt 195.8 lb

## 2013-01-15 DIAGNOSIS — R059 Cough, unspecified: Secondary | ICD-10-CM

## 2013-01-15 DIAGNOSIS — I5032 Chronic diastolic (congestive) heart failure: Secondary | ICD-10-CM

## 2013-01-15 DIAGNOSIS — R05 Cough: Secondary | ICD-10-CM

## 2013-01-15 DIAGNOSIS — Z954 Presence of other heart-valve replacement: Secondary | ICD-10-CM

## 2013-01-15 DIAGNOSIS — R42 Dizziness and giddiness: Secondary | ICD-10-CM

## 2013-01-15 DIAGNOSIS — E785 Hyperlipidemia, unspecified: Secondary | ICD-10-CM | POA: Diagnosis not present

## 2013-01-15 NOTE — Patient Instructions (Addendum)
Your physician recommends that you schedule a follow-up appointment in CVRR around September 9,2014--this will be 4 weeks after your last INR 01/02/13.   Your physician recommends that you return for a FASTING lipid profile /liver profile in 2 months.    Your physician recommends that you schedule a follow-up appointment in: 3 months with Dr Shirlee Latch.

## 2013-01-16 NOTE — Progress Notes (Signed)
Patient ID: Abigail Oliver, female   DOB: 1945-11-03, 67 y.o.   MRN: [id number] PCP: Dr. Debby Bud  67 yo with history of aortic stenosis s/p St Jude mechanical AVR (6/00) and CAD s/p single vessel CABG (6/00) presents for cardiology evaluation.  She has been seen by Dr. Tresa Endo in the past and is seen by me for the first time today.  She had severe aortic stenosis and therefore required CABG-AVR in 6/00.  She had a RIMA-RCA. She had a myoview in 4/13 with no ischemia or infarction, and echo in 6/14 showd normal EF with moderate diastolic dysfunction and a well-seated mechanical valve.    At last appointment, she appeared volume overloaded and also had been having problems with lightheadedness with standing.  I increased her torsemide and stopped losartan.  She is now less lightheaded with standing and her breathing is better.  She is able to walk 100-150 feet before becoming short of breath, which is an improvement (before, short of breath walking around house).  Holter monitor from 8/14 showed a few PACs otherwise nothing significant.    Labs (6/14): BNP 216, HCT 32, K 4.8, creatinine 0.8 Labs (8/14): K 4.4, creatinine 0.8, BNP 55, HDL 45, LDL 141  PMH: 1. Aortic stenosis: Now has St Jude mechanical aortic valve (6/00).  Echo (6/14) with EF 60-65%, mild LVH, moderate diastolic dysfunction, normal RV, mechanical aortic valve with mean gradient 20 mmHg.  2. CAD: CABG at time of AVR in 6/00 with RIMA-RCA.  Myoview in 4/13 with EF 72%, no ischemia or infarction.  3. HTN 4. Type II diabetes 5. GERD 6. OSA 7. Hyperlipidemia 8. PAD: Right SFA stent.  9. Colon polyps 10. Carotid stenosis: Carotid dopplers (11/13) witih 50-69% RICA.  11. Diastolic CHF 12. Gastritis 13. Dizziness: Holter (8/14) with few PACs, otherwise unremarkable.   SH: Married, works as Interior and spatial designer, nonsmoker.   FH: CAD  Current Outpatient Prescriptions  Medication Sig Dispense Refill  . Ascorbic Acid (VITAMIN C) 500 MG tablet  Take 500 mg by mouth 2 (two) times daily.       Marland Kitchen aspirin EC 81 MG tablet Take 81 mg by mouth daily.      . calcium-vitamin D (CALCIUM 500+D) 500-200 MG-UNIT per tablet Take 1 tablet by mouth 2 (two) times daily.       . citalopram (CELEXA) 20 MG tablet Take 20 mg by mouth daily.      . cyclobenzaprine (FLEXERIL) 10 MG tablet Take 10 mg by mouth 3 (three) times daily as needed for muscle spasms.      . folic acid (FOLVITE) 1 MG tablet Take 1 mg by mouth daily.       Marland Kitchen latanoprost (XALATAN) 0.005 % ophthalmic solution Place 1 drop into both eyes at bedtime.      . metFORMIN (GLUCOPHAGE) 500 MG tablet TAKE 1 TABLET BY MOUTH TWICE DAILY WITH A MEAL  60 tablet  0  . metoprolol succinate (TOPROL-XL) 50 MG 24 hr tablet Take 1.5 tablets by mouth twice daily  90 tablet  6  . pantoprazole (PROTONIX) 40 MG tablet Take 40 mg by mouth 2 (two) times daily as needed.      . potassium chloride (K-DUR,KLOR-CON) 10 MEQ tablet 2 tablets (total 20 mEq) in the AM and 1 tablet (total 10 mEq ) in the PM  90 tablet  3  . simvastatin (ZOCOR) 40 MG tablet Take 1 tablet (40 mg total) by mouth at bedtime.  90 tablet  3  .  tiZANidine (ZANAFLEX) 4 MG tablet Take 4 mg by mouth as needed.      . torsemide (DEMADEX) 20 MG tablet 2 tablets (total 40mg ) in the AM and 1 tablet (total 20mg ) in the PM  90 tablet  3  . traMADol (ULTRAM) 50 MG tablet Take 1 tablet (50 mg total) by mouth every 8 (eight) hours as needed for pain.  100 tablet  1  . warfarin (COUMADIN) 3 MG tablet Take 3 mg by mouth daily. Take as directed per coumadin clinic      . zolpidem (AMBIEN) 10 MG tablet TAKE ONE TABLET BY MOUTH AT BEDTIME AS NEEDED FOR SLEEP  30 tablet  0   No current facility-administered medications for this visit.    BP 122/56  Pulse 70  Ht 5\' 2"  (1.575 m)  Wt 88.814 kg (195 lb 12.8 oz)  BMI 35.8 kg/m2  SpO2 96% General: NAD Neck: JVP 7 cm, no thyromegaly or thyroid nodule.  Lungs: Clear to auscultation bilaterally with normal  respiratory effort. CV: Nondisplaced PMI.  Heart regular S1/S2, mechanical S2, no S3/S4, 2/6 early SEM RUSB.  No ankle edema.  No carotid bruit.  Normal pedal pulses.  Abdomen: Soft, nontender, no hepatosplenomegaly, no distention.  Neurologic: Alert and oriented x 3.  Psych: Normal affect. Extremities: No clubbing or cyanosis.   Assessment/Plan: 1. St Jude mechanical aortic valve: Well-seated on echo in 6/14.  Continue coumadin and ASA 81 mg daily.  2. Chronic diastolic CHF: Volume status better today, less short of breath.  Continue current torsemide and KCl.   3. Dizzy spells: These seemed orthostatic.  Mostly resolved after stopping losartan.  48 hour holter was unremarkable.  4. CAD: s/p RIMA-RCA at time of AVR.  Continue ASA 81 and statin.  5. Hyperlipidemia: She just started back on simvastatin (had been off Crestor about 2 months when last labs were drawn, too expensive).  Will get lipids/LFTs in 2 months.  6. Carotid dopplers: Needs followup carotids in 11/14 (will set up at followup).   Marca Ancona 01/16/2013

## 2013-01-29 ENCOUNTER — Ambulatory Visit (INDEPENDENT_AMBULATORY_CARE_PROVIDER_SITE_OTHER): Payer: Medicare Other

## 2013-01-29 DIAGNOSIS — Z7901 Long term (current) use of anticoagulants: Secondary | ICD-10-CM

## 2013-01-29 DIAGNOSIS — Z954 Presence of other heart-valve replacement: Secondary | ICD-10-CM | POA: Diagnosis not present

## 2013-01-29 DIAGNOSIS — Z952 Presence of prosthetic heart valve: Secondary | ICD-10-CM

## 2013-01-29 LAB — POCT INR: INR: 2.8

## 2013-02-23 ENCOUNTER — Other Ambulatory Visit: Payer: Self-pay | Admitting: Internal Medicine

## 2013-03-05 ENCOUNTER — Other Ambulatory Visit (INDEPENDENT_AMBULATORY_CARE_PROVIDER_SITE_OTHER): Payer: Medicare Other

## 2013-03-05 ENCOUNTER — Ambulatory Visit (INDEPENDENT_AMBULATORY_CARE_PROVIDER_SITE_OTHER): Payer: Medicare Other | Admitting: *Deleted

## 2013-03-05 DIAGNOSIS — I5032 Chronic diastolic (congestive) heart failure: Secondary | ICD-10-CM | POA: Diagnosis not present

## 2013-03-05 DIAGNOSIS — Z952 Presence of prosthetic heart valve: Secondary | ICD-10-CM

## 2013-03-05 DIAGNOSIS — Z954 Presence of other heart-valve replacement: Secondary | ICD-10-CM | POA: Diagnosis not present

## 2013-03-05 DIAGNOSIS — Z7901 Long term (current) use of anticoagulants: Secondary | ICD-10-CM | POA: Diagnosis not present

## 2013-03-05 LAB — HEPATIC FUNCTION PANEL
ALT: 31 U/L (ref 0–35)
AST: 40 U/L — ABNORMAL HIGH (ref 0–37)
Albumin: 4.5 g/dL (ref 3.5–5.2)
Alkaline Phosphatase: 63 U/L (ref 39–117)
Bilirubin, Direct: 0.2 mg/dL (ref 0.0–0.3)
Total Bilirubin: 1.4 mg/dL — ABNORMAL HIGH (ref 0.3–1.2)
Total Protein: 8.1 g/dL (ref 6.0–8.3)

## 2013-03-05 LAB — LIPID PANEL
Cholesterol: 136 mg/dL (ref 0–200)
HDL: 42.5 mg/dL (ref >39.00)
LDL Cholesterol: 61 mg/dL (ref 0–99)
Total CHOL/HDL Ratio: 3
Triglycerides: 161 mg/dL — ABNORMAL HIGH (ref 0.0–149.0)
VLDL: 32.2 mg/dL (ref 0.0–40.0)

## 2013-03-05 LAB — POCT INR: INR: 4.4

## 2013-03-29 ENCOUNTER — Other Ambulatory Visit: Payer: Self-pay

## 2013-04-05 ENCOUNTER — Other Ambulatory Visit: Payer: Self-pay | Admitting: Internal Medicine

## 2013-04-09 ENCOUNTER — Ambulatory Visit (INDEPENDENT_AMBULATORY_CARE_PROVIDER_SITE_OTHER): Payer: Medicare Other | Admitting: Cardiology

## 2013-04-09 ENCOUNTER — Encounter: Payer: Self-pay | Admitting: Cardiology

## 2013-04-09 ENCOUNTER — Ambulatory Visit (INDEPENDENT_AMBULATORY_CARE_PROVIDER_SITE_OTHER): Payer: Medicare Other | Admitting: Pharmacist

## 2013-04-09 VITALS — BP 112/56 | HR 67 | Ht 62.0 in | Wt 191.0 lb

## 2013-04-09 DIAGNOSIS — E785 Hyperlipidemia, unspecified: Secondary | ICD-10-CM

## 2013-04-09 DIAGNOSIS — Z7901 Long term (current) use of anticoagulants: Secondary | ICD-10-CM

## 2013-04-09 DIAGNOSIS — Z952 Presence of prosthetic heart valve: Secondary | ICD-10-CM

## 2013-04-09 DIAGNOSIS — Z954 Presence of other heart-valve replacement: Secondary | ICD-10-CM

## 2013-04-09 DIAGNOSIS — R42 Dizziness and giddiness: Secondary | ICD-10-CM

## 2013-04-09 DIAGNOSIS — I5032 Chronic diastolic (congestive) heart failure: Secondary | ICD-10-CM | POA: Diagnosis not present

## 2013-04-09 DIAGNOSIS — I739 Peripheral vascular disease, unspecified: Secondary | ICD-10-CM

## 2013-04-09 LAB — CBC WITH DIFFERENTIAL/PLATELET
Basophils Absolute: 0.1 10*3/uL (ref 0.0–0.1)
Basophils Relative: 0.8 % (ref 0.0–3.0)
Eosinophils Absolute: 0.1 10*3/uL (ref 0.0–0.7)
Eosinophils Relative: 1.2 % (ref 0.0–5.0)
HCT: 37 % (ref 36.0–46.0)
Hemoglobin: 12.5 g/dL (ref 12.0–15.0)
Lymphocytes Relative: 33.2 % (ref 12.0–46.0)
Lymphs Abs: 2.8 10*3/uL (ref 0.7–4.0)
MCHC: 33.7 g/dL (ref 30.0–36.0)
MCV: 85.4 fl (ref 78.0–100.0)
Monocytes Absolute: 0.7 10*3/uL (ref 0.1–1.0)
Monocytes Relative: 8.4 % (ref 3.0–12.0)
Neutro Abs: 4.7 10*3/uL (ref 1.4–7.7)
Neutrophils Relative %: 56.4 % (ref 43.0–77.0)
Platelets: 202 10*3/uL (ref 150.0–400.0)
RBC: 4.34 Mil/uL (ref 3.87–5.11)
RDW: 16.3 % — ABNORMAL HIGH (ref 11.5–14.6)
WBC: 8.3 10*3/uL (ref 4.5–10.5)

## 2013-04-09 LAB — POCT INR: INR: 2.6

## 2013-04-09 MED ORDER — ROSUVASTATIN CALCIUM 20 MG PO TABS: ORAL_TABLET | ORAL | Status: DC

## 2013-04-09 MED ORDER — METOPROLOL SUCCINATE ER 50 MG PO TB24: 50.0000 mg | ORAL_TABLET | Freq: Two times a day (BID) | ORAL | Status: DC

## 2013-04-09 NOTE — Patient Instructions (Signed)
Decrease Toprol XL (metoprolol succinate) to 50mg  two times a day.   Stop simvastatin.   Start crestor 10mg  daily after you have been of simvastatin for 1 week. I have sent in a prescription for 1/2 of a 20mg  tablet daily.   Your physician recommends that you return for lab work today--BMET/CBCd/TSH/Magnesium level.   Your physician wants you to follow-up in: 6 months with Dr Shirlee Latch. (May 2015). You will receive a reminder letter in the mail two months in advance. If you don't receive a letter, please call our office to schedule the follow-up appointment.

## 2013-04-10 LAB — BASIC METABOLIC PANEL
BUN: 21 mg/dL (ref 6–23)
CO2: 32 mEq/L (ref 19–32)
Calcium: 10.1 mg/dL (ref 8.4–10.5)
Chloride: 98 mEq/L (ref 96–112)
Creatinine, Ser: 0.7 mg/dL (ref 0.4–1.2)
GFR: 83.05 mL/min (ref >60.00)
Glucose, Bld: 120 mg/dL — ABNORMAL HIGH (ref 70–99)
Potassium: 4 mEq/L (ref 3.5–5.1)
Sodium: 139 mEq/L (ref 135–145)

## 2013-04-10 LAB — MAGNESIUM: Magnesium: 1.7 mg/dL (ref 1.5–2.5)

## 2013-04-10 LAB — TSH: TSH: 2.19 u[IU]/mL (ref 0.35–5.50)

## 2013-04-10 NOTE — Progress Notes (Signed)
Patient ID: Abigail Oliver, female   DOB: 12/21/1945, 67 y.o.   MRN: [id number] PCP: Dr. Debby Bud  67 yo with history of aortic stenosis s/p St Jude mechanical AVR (6/00) and CAD s/p single vessel CABG (6/00) presents for cardiology evaluation.  She has been seen by Dr. Tresa Endo in the past and is seen by me for the first time today.  She had severe aortic stenosis and therefore required CABG-AVR in 6/00.  She had a RIMA-RCA. She had a myoview in 4/13 with no ischemia or infarction, and echo in 6/14 showed normal EF with moderate diastolic dysfunction and a well-seated mechanical valve.    Since last appointment, weight is down 4 lbs.  She still has some lightheadedness with standing but it is better since stopping losartan.  She has stable dyspnea/fatigue after walking 75-100 feet.  No chest pain.  She has muscle cramps that are worse since she stopped Crestor and started simvastatin.    Labs (6/14): BNP 216, HCT 32, K 4.8, creatinine 0.8 Labs (8/14): K 4.4, creatinine 0.8, BNP 55, HDL 45, LDL 141 Labs (10/14): LDL 61, HDL 43, AST 40, ALT 31  PMH: 1. Aortic stenosis: Now has St Jude mechanical aortic valve (6/00).  Echo (6/14) with EF 60-65%, mild LVH, moderate diastolic dysfunction, normal RV, mechanical aortic valve with mean gradient 20 mmHg.  2. CAD: CABG at time of AVR in 6/00 with RIMA-RCA.  Myoview in 4/13 with EF 72%, no ischemia or infarction.  3. HTN 4. Type II diabetes 5. GERD 6. OSA 7. Hyperlipidemia 8. PAD: Right SFA stent.  9. Colon polyps 10. Carotid stenosis: Carotid dopplers (11/13) witih 50-69% RICA.  11. Diastolic CHF 12. Gastritis 13. Dizziness: Holter (8/14) with few PACs, otherwise unremarkable.  14. Mild transaminase elevation  SH: Married, works as Interior and spatial designer, nonsmoker.   FH: CAD  ROS: All systems reviewed and negative except as per HPI.   Current Outpatient Prescriptions  Medication Sig Dispense Refill  . Ascorbic Acid (VITAMIN C) 500 MG tablet Take 500 mg  by mouth 2 (two) times daily.       Marland Kitchen aspirin EC 81 MG tablet Take 81 mg by mouth daily.      . calcium-vitamin D (CALCIUM 500+D) 500-200 MG-UNIT per tablet Take 1 tablet by mouth 2 (two) times daily.       . citalopram (CELEXA) 20 MG tablet Take 20 mg by mouth daily.      . folic acid (FOLVITE) 1 MG tablet Take 1 mg by mouth daily.       Marland Kitchen latanoprost (XALATAN) 0.005 % ophthalmic solution Place 1 drop into both eyes at bedtime.      . metFORMIN (GLUCOPHAGE) 500 MG tablet TAKE 1 TABLET BY MOUTH TWICE DAILY WITH A MEAL  60 tablet  5  . metoprolol succinate (TOPROL-XL) 50 MG 24 hr tablet Take 1 tablet (50 mg total) by mouth 2 (two) times daily.  60 tablet  6  . pantoprazole (PROTONIX) 40 MG tablet Take 40 mg by mouth 2 (two) times daily as needed.      . potassium chloride (K-DUR,KLOR-CON) 10 MEQ tablet 2 tablets (total 20 mEq) in the AM and 1 tablet (total 10 mEq ) in the PM  90 tablet  3  . torsemide (DEMADEX) 20 MG tablet 2 tablets (total 40mg ) in the AM and 1 tablet (total 20mg ) in the PM  90 tablet  3  . traMADol (ULTRAM) 50 MG tablet Take 1 tablet (50 mg  total) by mouth every 8 (eight) hours as needed for pain.  100 tablet  1  . warfarin (COUMADIN) 3 MG tablet Take 3 mg by mouth daily. Take as directed per coumadin clinic      . zolpidem (AMBIEN) 10 MG tablet TAKE 1 TABLET BY MOUTH ONCE DAILY AT BEDTIME  30 tablet  0  . rosuvastatin (CRESTOR) 20 MG tablet 1/2 tablet daily  30 tablet  3   No current facility-administered medications for this visit.    BP 112/56  Pulse 67  Ht 5\' 2"  (1.575 m)  Wt 191 lb (86.637 kg)  BMI 34.93 kg/m2  SpO2 98% General: NAD Neck: JVP 7 cm, no thyromegaly or thyroid nodule.  Lungs: Clear to auscultation bilaterally with normal respiratory effort. CV: Nondisplaced PMI.  Heart regular S1/S2, mechanical S2, no S3/S4, 2/6 early SEM RUSB.  No ankle edema.  No carotid bruit.  Normal pedal pulses.  Abdomen: Soft, nontender, no hepatosplenomegaly, no distention.   Neurologic: Alert and oriented x 3.  Psych: Normal affect. Extremities: No clubbing or cyanosis.   Assessment/Plan: 1. St Jude mechanical aortic valve: Well-seated on echo in 6/14.  Continue coumadin and ASA 81 mg daily.  2. Chronic diastolic CHF: Volume status ok.  NYHA class II-III symptoms, stable.  Continue current torsemide and KCl.   3. Dizzy spells: These seemed orthostatic.  Better after stopping losartan.  48 hour holter was unremarkable. As she is still having some orthostatic-type symptoms, I will have her decrease Toprol XL to 50 mg bid.   4. CAD: s/p RIMA-RCA at time of AVR.  Continue ASA 81 and statin.  5. Hyperlipidemia: She seems to be having myalgias from simvastatin.  She did better on Crestor.  I will have her stop simvastatin and stay off statin for 1 week.  After that I will start her back on Crestor 10 mg daily with lipids/LFTs in 2 months.  Mild transaminase elevation appears to have been stable and may be related to fatty liver disease.   6. Carotid dopplers: Will arrange followup dopplers for this month.   Marca Ancona 04/10/2013

## 2013-04-20 ENCOUNTER — Telehealth: Payer: Self-pay

## 2013-04-20 NOTE — Telephone Encounter (Signed)
I called patient to let her know she is due for a Prevnar vaccine. She will call back to schedule.

## 2013-04-23 DIAGNOSIS — Z23 Encounter for immunization: Secondary | ICD-10-CM | POA: Diagnosis not present

## 2013-04-23 DIAGNOSIS — E1142 Type 2 diabetes mellitus with diabetic polyneuropathy: Secondary | ICD-10-CM | POA: Diagnosis not present

## 2013-04-23 DIAGNOSIS — E669 Obesity, unspecified: Secondary | ICD-10-CM | POA: Diagnosis not present

## 2013-04-23 DIAGNOSIS — E1149 Type 2 diabetes mellitus with other diabetic neurological complication: Secondary | ICD-10-CM | POA: Diagnosis not present

## 2013-04-24 ENCOUNTER — Other Ambulatory Visit: Payer: Self-pay | Admitting: Cardiovascular Disease

## 2013-05-06 ENCOUNTER — Other Ambulatory Visit: Payer: Self-pay | Admitting: Cardiology

## 2013-05-06 ENCOUNTER — Other Ambulatory Visit: Payer: Self-pay | Admitting: Internal Medicine

## 2013-05-07 ENCOUNTER — Ambulatory Visit (INDEPENDENT_AMBULATORY_CARE_PROVIDER_SITE_OTHER): Payer: Medicare Other | Admitting: General Practice

## 2013-05-07 DIAGNOSIS — Z954 Presence of other heart-valve replacement: Secondary | ICD-10-CM

## 2013-05-07 DIAGNOSIS — Z7901 Long term (current) use of anticoagulants: Secondary | ICD-10-CM

## 2013-05-07 DIAGNOSIS — Z952 Presence of prosthetic heart valve: Secondary | ICD-10-CM

## 2013-05-07 DIAGNOSIS — M25049 Hemarthrosis, unspecified hand: Secondary | ICD-10-CM

## 2013-05-07 LAB — POCT INR: INR: 1.9

## 2013-05-23 ENCOUNTER — Telehealth (HOSPITAL_COMMUNITY): Payer: Self-pay | Admitting: *Deleted

## 2013-05-28 ENCOUNTER — Ambulatory Visit (INDEPENDENT_AMBULATORY_CARE_PROVIDER_SITE_OTHER): Payer: Medicare Other | Admitting: *Deleted

## 2013-05-28 ENCOUNTER — Ambulatory Visit (INDEPENDENT_AMBULATORY_CARE_PROVIDER_SITE_OTHER): Payer: Medicare Other

## 2013-05-28 ENCOUNTER — Ambulatory Visit (INDEPENDENT_AMBULATORY_CARE_PROVIDER_SITE_OTHER): Payer: Medicare Other | Admitting: Family Medicine

## 2013-05-28 ENCOUNTER — Encounter: Payer: Self-pay | Admitting: Family Medicine

## 2013-05-28 VITALS — BP 142/76 | HR 71 | Wt 186.0 lb

## 2013-05-28 DIAGNOSIS — Z5181 Encounter for therapeutic drug level monitoring: Secondary | ICD-10-CM | POA: Diagnosis not present

## 2013-05-28 DIAGNOSIS — Z954 Presence of other heart-valve replacement: Secondary | ICD-10-CM

## 2013-05-28 DIAGNOSIS — M75102 Unspecified rotator cuff tear or rupture of left shoulder, not specified as traumatic: Secondary | ICD-10-CM

## 2013-05-28 DIAGNOSIS — M25519 Pain in unspecified shoulder: Secondary | ICD-10-CM | POA: Diagnosis not present

## 2013-05-28 DIAGNOSIS — M25512 Pain in left shoulder: Secondary | ICD-10-CM

## 2013-05-28 DIAGNOSIS — M67919 Unspecified disorder of synovium and tendon, unspecified shoulder: Secondary | ICD-10-CM | POA: Diagnosis not present

## 2013-05-28 DIAGNOSIS — M719 Bursopathy, unspecified: Secondary | ICD-10-CM

## 2013-05-28 DIAGNOSIS — Z7901 Long term (current) use of anticoagulants: Secondary | ICD-10-CM | POA: Diagnosis not present

## 2013-05-28 DIAGNOSIS — Z952 Presence of prosthetic heart valve: Secondary | ICD-10-CM

## 2013-05-28 LAB — POCT INR: INR: 3.3

## 2013-05-28 NOTE — Progress Notes (Signed)
Pre-visit discussion using our clinic review tool. No additional management support is needed unless otherwise documented below in the visit note.  

## 2013-05-28 NOTE — Patient Instructions (Signed)
Very nice to meet you Try exercises daily starting in 2 days Ice 20 minutes 2 timesa day can be helpful  Glucosamine sulfate 750mg  twice a day is a supplement that has been shown to help moderate to severe arthritis. Vitamin D 1000 IU daily Fish oil 2 grams daily.  Tumeric 500mg  twice daily.  Capsaicin topically up to four times a day may also help with pain. Come back and see me in 3-4 weeks.

## 2013-05-28 NOTE — Progress Notes (Signed)
I'm seeing this patient by the request  of:  Adella Hare, MD   CC: Shoulder pain, left  HPI: Patient is a 68 year old female who is coming in with the complaint of shoulder pain. Patient does have a past medical history significant for diabetes, chronic anticoagulation, coronary artery disease, aortic valve replacement, and peripheral neuropathy. Patient states that this is the left side. Been going on for approximately 1 month. Patient states that she did have some mild radiation down the arm as well as up to her neck. Patient denies any numbness or any weakness but states that any overhead activity or reaching behind is significantly painful. 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 Patient's tried some over-the-counter medications with no improvement. Patient is concerned taken any medication secondary to her other comorbidities. Patient was severity is 7/10  Past medical, surgical, family and social history reviewed. Medications reviewed all in the electronic medical record.   Review of Systems: No headache, visual changes, nausea, vomiting, diarrhea, constipation, dizziness, abdominal pain, skin rash, fevers, chills, night sweats, weight loss, swollen lymph nodes, body aches, joint swelling, muscle aches, chest pain, shortness of breath, mood changes.   X-rays are reviewed by me. Patient's right shoulder x-rays back in September of 2013 showed no bony abnormality. No x-rays of left shoulder  Objective:    Blood pressure 142/76, pulse 71, weight 186 lb (84.369 kg), SpO2 96.00%.   General: No apparent distress alert and oriented x3 mood and affect normal, dressed appropriately.  HEENT: Pupils equal, extraocular movements intact Respiratory: Patient's speak in full sentences and does not appear short of breath Cardiovascular: No lower extremity edema, non tender, no erythema Skin: Warm dry intact with no signs of infection or rash on  extremities or on axial skeleton. Abdomen: Soft nontender Neuro: Cranial nerves II through XII are intact, neurovascularly intact in all extremities with 2+ DTRs and 2+ pulses. Lymph: No lymphadenopathy of posterior or anterior cervical chain or axillae bilaterally.  Gait normal with good balance and coordination.  MSK: Non tender with full range of motion and good stability and symmetric strength and tone of elbows, wrist, hip, knee and ankles bilaterally.  Shoulder: Left Inspection reveals no abnormalities, atrophy or asymmetry. Palpation is normal with no tenderness over AC joint or bicipital groove. Range of motion is decreased actively to forward flexion of 120 and internal rotation to the sacrum. Patient does have full passive range of motion but is extremely uncomfortable. Rotator cuff strength normal throughout. Positive signs of impingement with negative Neer and Hawkin's tests, negative empty can sign. Speeds and Yergason's tests normal. No labral pathology noted with negative Obrien's, negative clunk and good stability. Normal scapular function observed. No painful arc and no drop arm sign. No apprehension sign Contralateral shoulder unremarkable  MSK US performed of: Left shoulder This study was ordered, performed, and interpreted by Charlann Boxer D.O.  Shoulder:   Supraspinatus:  Patient supraspinatus appears to have a partial hematoma above the tendon itself. This is filling the space and causing some compression especially under the impingement view. Patient also has calcific changes in this area. Infraspinatus:  Appears normal on long and transverse views. Mild atrophy Subscapularis:  Appears normal on long and transverse views. Teres Minor:  Appears normal on long and transverse views. AC joint:  Moderate arthritic changes Glenohumeral Joint:  Moderate arthritic changes Glenoid Labrum:  Intact without visualized tears. Biceps Tendon:  Appears normal on long and transverse  views,  no fraying of tendon, tendon located in intertubercular groove, no subluxation with shoulder internal or external rotation. No increased power doppler signal. Impression: Subacromial hematoma causing impingement of the supraspinatus  Procedure: Real-time Ultrasound Guided Injection of left glenohumeral joint Device: GE Logiq E  Ultrasound guided injection is preferred based studies that show increased duration, increased effect, greater accuracy, decreased procedural pain, increased response rate with ultrasound guided versus blind injection.  Verbal informed consent obtained.  Time-out conducted.  Noted no overlying erythema, induration, or other signs of local infection.  Skin prepped in a sterile fashion.  Local anesthesia: Topical Ethyl chloride.  With sterile technique and under real time ultrasound guidance:  Joint visualized.  23g 1  inch needle inserted posterior approach. Pictures taken for needle placement. Patient did have injection of 2 cc of 1% lidocaine, 2 cc of 0.5% Marcaine, and 1cc of Kenalog 40 mg/dL. Completed without difficulty  Pain immediately resolved suggesting accurate placement of the medication.  Advised to call if fevers/chills, erythema, induration, drainage, or persistent bleeding.  Images permanently stored and available for review in the ultrasound unit.  Impression: Technically successful ultrasound guided injection.    Impression and Recommendations:     This case required medical decision making of moderate complexity.

## 2013-05-28 NOTE — Assessment & Plan Note (Signed)
Patient had a injection as described above. Patient tolerated the procedure well with full range of motion afterwards. Post injection instructions given. A home exercise program given thera band. Discussed icing protocol. We'll avoid anti-inflammatories secondary to patient's comorbidities. Patient will try these interventions and come back in 4 weeks for further evaluation. She continues have pain and elected get further imaging but I do not think this will be likely I do think patient's hematoma above the supraspinatus is likely secondary to patient's chronic Coumadin use. We will need to monitor.

## 2013-06-06 ENCOUNTER — Other Ambulatory Visit: Payer: Self-pay | Admitting: Cardiovascular Disease

## 2013-06-18 ENCOUNTER — Ambulatory Visit (INDEPENDENT_AMBULATORY_CARE_PROVIDER_SITE_OTHER): Payer: Medicare Other | Admitting: Pharmacist

## 2013-06-18 DIAGNOSIS — Z952 Presence of prosthetic heart valve: Secondary | ICD-10-CM

## 2013-06-18 DIAGNOSIS — Z954 Presence of other heart-valve replacement: Secondary | ICD-10-CM | POA: Diagnosis not present

## 2013-06-18 DIAGNOSIS — Z7901 Long term (current) use of anticoagulants: Secondary | ICD-10-CM

## 2013-06-18 LAB — POCT INR: INR: 2.3

## 2013-06-28 ENCOUNTER — Telehealth: Payer: Self-pay

## 2013-06-28 MED ORDER — GABAPENTIN 300 MG PO CAPS: 300.0000 mg | ORAL_CAPSULE | Freq: Every day | ORAL | Status: DC

## 2013-06-28 NOTE — Telephone Encounter (Signed)
Phone call from patient yesterday evening requesting a refill on Gabapentin for her neuropathy. I do not see this on her med list. Please advise.

## 2013-06-28 NOTE — Telephone Encounter (Signed)
Was on med history - gaba 300 mg q HS for peripheral neuropathy. OK to refill, #30, prn refills

## 2013-07-02 ENCOUNTER — Encounter: Payer: Self-pay | Admitting: Podiatry

## 2013-07-02 ENCOUNTER — Telehealth: Payer: Self-pay | Admitting: *Deleted

## 2013-07-02 ENCOUNTER — Ambulatory Visit: Payer: Medicare Other | Admitting: Family Medicine

## 2013-07-02 ENCOUNTER — Ambulatory Visit (INDEPENDENT_AMBULATORY_CARE_PROVIDER_SITE_OTHER): Payer: Medicare Other | Admitting: Podiatry

## 2013-07-02 VITALS — BP 135/76 | HR 62 | Resp 12

## 2013-07-02 DIAGNOSIS — L03039 Cellulitis of unspecified toe: Secondary | ICD-10-CM

## 2013-07-02 NOTE — Patient Instructions (Signed)

## 2013-07-02 NOTE — Telephone Encounter (Addendum)
Pt states right big toe has been ingrown for 2 weeks, but only began to bleed 1 weeks ago.  I instructed pt to use warm Epsom salt soaks and cover with a Neosporin bandaid after soaks, pt states she has.  I will refer to scheduler for an appt, and inform Dr Paulla Dolly, to request an antibiotic to get to appt date.  I reviewed pt's Misys chart and she has not been evaluated since 2012.  I gave pt a 245pm today appt with Dr Paulla Dolly.

## 2013-07-02 NOTE — Progress Notes (Signed)
   Subjective:    Patient ID: Abigail Oliver, female    DOB: 03-09-1946, 68 y.o.   MRN: [id number]  HPI '' RT FOOT GREAT TOENAIL IS HURTING FOR 2 WEEKS AND ITS GETTING WORSE. TREATMENT TRIED TO SOAK IT WITH WARM WATER AND TRIPLE ANTIBIOTIC TWICE A DAY, AND IS NOT GETTING BETTER.''    Review of Systems  All other systems reviewed and are negative.       Objective:   Physical Exam        Assessment & Plan:

## 2013-07-02 NOTE — Telephone Encounter (Signed)
Message copied by Andres Ege on Mon Jul 02, 2013  9:41 AM ------      Message from: [place], Janett Billow M      Created: Mon Jul 02, 2013  9:17 AM      Regarding: Diabetic with infected ingrown      Contact: [phone]       Abigail Oliver is a diabetic patient with a severely infected ingrown toenail. She only wants to see Dr. Blenda Mounts. She is wanting to know if we can get her antibiotics until we can schedule her an appointment.            Thanks.                  Janett Billow ------

## 2013-07-03 NOTE — Progress Notes (Signed)
Subjective:     Patient ID: Abigail Oliver, female   DOB: 23-Nov-1945, 68 y.o.   MRN: [id number]  HPI patient presents with several week history of an ingrown toenail right hallux lateral border that she's tried to trim herself and soak without relief of her symptoms. Sugar has been running okay with last A1c being 6.5 and no other systemic change   Review of Systems     Objective:   Physical Exam Neurovascular status is intact with an incurvated right hallux nail lateral border with localized irritation no proximal edema erythema or lymph node distention    Assessment:     Paronychia infection right hallux lateral border    Plan:     Discussed simple procedure and today infiltrated 60 mg Xylocaine Marcaine mixture removing the lateral border and proud flesh from the distal tissue and applying sterile dressing reappoint her recheck as needed

## 2013-07-11 ENCOUNTER — Other Ambulatory Visit: Payer: Self-pay | Admitting: Cardiovascular Disease

## 2013-07-11 ENCOUNTER — Other Ambulatory Visit: Payer: Self-pay | Admitting: Internal Medicine

## 2013-07-16 ENCOUNTER — Ambulatory Visit (INDEPENDENT_AMBULATORY_CARE_PROVIDER_SITE_OTHER): Payer: Medicare Other

## 2013-07-16 DIAGNOSIS — Z7901 Long term (current) use of anticoagulants: Secondary | ICD-10-CM | POA: Diagnosis not present

## 2013-07-16 DIAGNOSIS — Z952 Presence of prosthetic heart valve: Secondary | ICD-10-CM

## 2013-07-16 DIAGNOSIS — Z954 Presence of other heart-valve replacement: Secondary | ICD-10-CM

## 2013-07-16 LAB — POCT INR: INR: 2.3

## 2013-07-23 ENCOUNTER — Other Ambulatory Visit: Payer: Self-pay | Admitting: Internal Medicine

## 2013-07-24 ENCOUNTER — Other Ambulatory Visit: Payer: Self-pay | Admitting: Cardiovascular Disease

## 2013-07-24 DIAGNOSIS — E1142 Type 2 diabetes mellitus with diabetic polyneuropathy: Secondary | ICD-10-CM | POA: Diagnosis not present

## 2013-07-24 DIAGNOSIS — E669 Obesity, unspecified: Secondary | ICD-10-CM | POA: Diagnosis not present

## 2013-07-24 DIAGNOSIS — Z6834 Body mass index (BMI) 34.0-34.9, adult: Secondary | ICD-10-CM | POA: Diagnosis not present

## 2013-07-24 DIAGNOSIS — E1149 Type 2 diabetes mellitus with other diabetic neurological complication: Secondary | ICD-10-CM | POA: Diagnosis not present

## 2013-07-25 ENCOUNTER — Other Ambulatory Visit: Payer: Self-pay | Admitting: Cardiology

## 2013-07-26 NOTE — Telephone Encounter (Signed)
Is this ok to refill or defer to pcp? Thanks, MI

## 2013-07-27 NOTE — Telephone Encounter (Signed)
PCP?

## 2013-08-07 ENCOUNTER — Ambulatory Visit (INDEPENDENT_AMBULATORY_CARE_PROVIDER_SITE_OTHER): Payer: Medicare Other | Admitting: Pharmacist

## 2013-08-07 DIAGNOSIS — Z7901 Long term (current) use of anticoagulants: Secondary | ICD-10-CM

## 2013-08-07 DIAGNOSIS — Z954 Presence of other heart-valve replacement: Secondary | ICD-10-CM

## 2013-08-07 DIAGNOSIS — Z952 Presence of prosthetic heart valve: Secondary | ICD-10-CM

## 2013-08-07 LAB — POCT INR: INR: 2.4

## 2013-08-09 ENCOUNTER — Other Ambulatory Visit: Payer: Self-pay | Admitting: Internal Medicine

## 2013-08-28 NOTE — Telephone Encounter (Signed)
Error

## 2013-09-05 ENCOUNTER — Telehealth: Payer: Self-pay | Admitting: Internal Medicine

## 2013-09-05 ENCOUNTER — Ambulatory Visit (INDEPENDENT_AMBULATORY_CARE_PROVIDER_SITE_OTHER): Payer: Medicare Other | Admitting: Pharmacist

## 2013-09-05 DIAGNOSIS — Z954 Presence of other heart-valve replacement: Secondary | ICD-10-CM | POA: Diagnosis not present

## 2013-09-05 DIAGNOSIS — Z7901 Long term (current) use of anticoagulants: Secondary | ICD-10-CM

## 2013-09-05 DIAGNOSIS — Z952 Presence of prosthetic heart valve: Secondary | ICD-10-CM

## 2013-09-05 LAB — POCT INR: INR: 2.9

## 2013-09-05 MED ORDER — FOLIC ACID 1 MG PO TABS: 1.0000 mg | ORAL_TABLET | Freq: Every day | ORAL | Status: DC

## 2013-09-05 NOTE — Telephone Encounter (Signed)
Patient states she has called to have Folic acid refilled but has not heard back from anyone.  Please f/u.

## 2013-09-05 NOTE — Telephone Encounter (Signed)
Called pt to verify pharmacy pt states meed 90 due to insurance sent to [facility]. Inform pt I will send 90 day only. Pt need to get set-up with another physician since her md has retired for additional refills...Johny Chess

## 2013-09-14 ENCOUNTER — Telehealth: Payer: Self-pay | Admitting: Cardiology

## 2013-09-14 NOTE — Telephone Encounter (Signed)
New problem   Pt need to speak to nurse concerning bruising over her body.

## 2013-09-14 NOTE — Telephone Encounter (Signed)
If bruising extensive, hold aspirin for 4-5 days.

## 2013-09-14 NOTE — Telephone Encounter (Signed)
Pt is aware to hold aspirin for 4 to 5 days . Pt verbalized understanding, she states will call back after 5 days to see how she is doing.

## 2013-09-14 NOTE — Telephone Encounter (Signed)
Left pt a message to call back. 

## 2013-09-14 NOTE — Telephone Encounter (Signed)
Pt states that since she came to the Coumadin clinic on 4/15; she has noticing more bruising than usual, the bruising is in her left side of her body: left leg chin, behind the knee, waist line. The bruising is purple and orange around  It. Beside coumadin pt is taking a baby (57 mg)aspirin once a day. Pt states the only thing she is changed in her medication; is that she has not taken folic acid for the last 5 weeks. She does not know if that has to do anything with the bruising. Her last INR was 2.9

## 2013-09-21 ENCOUNTER — Other Ambulatory Visit: Payer: Self-pay

## 2013-09-21 ENCOUNTER — Telehealth: Payer: Self-pay

## 2013-09-21 MED ORDER — ZOLPIDEM TARTRATE 10 MG PO TABS: ORAL_TABLET | ORAL | Status: DC

## 2013-09-21 MED ORDER — CITALOPRAM HYDROBROMIDE 20 MG PO TABS: ORAL_TABLET | ORAL | Status: DC

## 2013-09-21 NOTE — Telephone Encounter (Signed)
I called and spoke to patient making her aware for further refills on these medications she needs a new pcp. Dr Linna Darner is just filling these one time.

## 2013-09-21 NOTE — Telephone Encounter (Signed)
[facility] patient  11/02/12 last seen by Dr Linda Hedges meds last filled 07/23/13 for Zolpidem and 08/10/13 for Citalopram

## 2013-09-21 NOTE — Telephone Encounter (Signed)
OK X1 This prescription will be refilled one time by  me; it will be necessary for him to establish with a primary care physician for additional refills. I am not taking new patients at this time as I shall be retiring on/1/17. 

## 2013-09-24 ENCOUNTER — Telehealth: Payer: Self-pay | Admitting: Cardiology

## 2013-09-24 NOTE — Telephone Encounter (Signed)
Pt called states Zolpidem is not longer covered by insurance.  Please advise

## 2013-09-24 NOTE — Telephone Encounter (Signed)
New message          Pt would like to know if there is a generic medication for Zolpidem because insurance will not pay for it no longer.

## 2013-09-24 NOTE — Telephone Encounter (Signed)
Dr [name] has not prescribed this for her in the past. Pt is going to ask  her PCP about alternative for Zolpidem.

## 2013-09-25 NOTE — Telephone Encounter (Signed)
Spoke with pt advised of MDs message 

## 2013-09-25 NOTE — Telephone Encounter (Signed)
Noted Pt should provide [place] her insurance formulary of options so we can choose another covered med thanks

## 2013-09-26 ENCOUNTER — Telehealth: Payer: Self-pay

## 2013-09-26 NOTE — Telephone Encounter (Signed)
Returned pts called left message for pt to return call

## 2013-09-26 NOTE — Telephone Encounter (Signed)
The patient called back and wanted to clarify her rx information with the CMA.   Thanks!

## 2013-09-27 ENCOUNTER — Telehealth: Payer: Self-pay | Admitting: *Deleted

## 2013-09-27 MED ORDER — TRAZODONE HCL 50 MG PO TABS: 25.0000 mg | ORAL_TABLET | Freq: Every evening | ORAL | Status: DC | PRN

## 2013-09-27 NOTE — Telephone Encounter (Signed)
Pt called requesting Trazadone Rx in place of Zolpidem.  Please advise

## 2013-09-27 NOTE — Telephone Encounter (Signed)
Ok erx done

## 2013-10-01 DIAGNOSIS — H409 Unspecified glaucoma: Secondary | ICD-10-CM | POA: Diagnosis not present

## 2013-10-01 DIAGNOSIS — H4011X0 Primary open-angle glaucoma, stage unspecified: Secondary | ICD-10-CM | POA: Diagnosis not present

## 2013-10-01 NOTE — Telephone Encounter (Signed)
Spoke with pt advised Rx changed.

## 2013-10-02 ENCOUNTER — Ambulatory Visit (INDEPENDENT_AMBULATORY_CARE_PROVIDER_SITE_OTHER): Payer: Medicare Other | Admitting: Pharmacist

## 2013-10-02 DIAGNOSIS — Z7901 Long term (current) use of anticoagulants: Secondary | ICD-10-CM | POA: Diagnosis not present

## 2013-10-02 DIAGNOSIS — Z952 Presence of prosthetic heart valve: Secondary | ICD-10-CM

## 2013-10-02 DIAGNOSIS — Z954 Presence of other heart-valve replacement: Secondary | ICD-10-CM | POA: Diagnosis not present

## 2013-10-02 LAB — POCT INR: INR: 5.3

## 2013-10-10 ENCOUNTER — Ambulatory Visit (INDEPENDENT_AMBULATORY_CARE_PROVIDER_SITE_OTHER): Payer: Medicare Other | Admitting: *Deleted

## 2013-10-10 DIAGNOSIS — Z954 Presence of other heart-valve replacement: Secondary | ICD-10-CM

## 2013-10-10 DIAGNOSIS — Z952 Presence of prosthetic heart valve: Secondary | ICD-10-CM

## 2013-10-10 DIAGNOSIS — Z7901 Long term (current) use of anticoagulants: Secondary | ICD-10-CM

## 2013-10-10 LAB — POCT INR: INR: 3.4

## 2013-10-17 ENCOUNTER — Telehealth: Payer: Self-pay | Admitting: *Deleted

## 2013-10-17 MED ORDER — FUROSEMIDE 20 MG PO TABS: 20.0000 mg | ORAL_TABLET | Freq: Every day | ORAL | Status: DC

## 2013-10-17 NOTE — Telephone Encounter (Signed)
Pt called states Furosemide is too expensive, requesting less expensive Rx.  Please advise

## 2013-10-17 NOTE — Telephone Encounter (Signed)
Change "torosemide" to furosemide - erx done

## 2013-10-18 ENCOUNTER — Other Ambulatory Visit (INDEPENDENT_AMBULATORY_CARE_PROVIDER_SITE_OTHER): Payer: Medicare Other

## 2013-10-18 ENCOUNTER — Encounter: Payer: Self-pay | Admitting: Family Medicine

## 2013-10-18 ENCOUNTER — Ambulatory Visit (INDEPENDENT_AMBULATORY_CARE_PROVIDER_SITE_OTHER)
Admission: RE | Admit: 2013-10-18 | Discharge: 2013-10-18 | Disposition: A | Discharge status: Home / Self Care | Payer: Medicare Other | Source: Ambulatory Visit | Attending: Family Medicine | Admitting: Family Medicine

## 2013-10-18 ENCOUNTER — Ambulatory Visit (INDEPENDENT_AMBULATORY_CARE_PROVIDER_SITE_OTHER): Payer: Medicare Other | Admitting: Family Medicine

## 2013-10-18 VITALS — BP 116/64 | HR 66 | Ht 62.0 in | Wt 186.0 lb

## 2013-10-18 DIAGNOSIS — M25519 Pain in unspecified shoulder: Secondary | ICD-10-CM | POA: Diagnosis not present

## 2013-10-18 DIAGNOSIS — M75112 Incomplete rotator cuff tear or rupture of left shoulder, not specified as traumatic: Secondary | ICD-10-CM | POA: Insufficient documentation

## 2013-10-18 DIAGNOSIS — M25512 Pain in left shoulder: Secondary | ICD-10-CM

## 2013-10-18 DIAGNOSIS — S43429A Sprain of unspecified rotator cuff capsule, initial encounter: Secondary | ICD-10-CM | POA: Diagnosis not present

## 2013-10-18 DIAGNOSIS — S46909A Unspecified injury of unspecified muscle, fascia and tendon at shoulder and upper arm level, unspecified arm, initial encounter: Secondary | ICD-10-CM | POA: Diagnosis not present

## 2013-10-18 DIAGNOSIS — S4980XA Other specified injuries of shoulder and upper arm, unspecified arm, initial encounter: Secondary | ICD-10-CM | POA: Diagnosis not present

## 2013-10-18 NOTE — Progress Notes (Signed)
CC: Shoulder pain, left  HPI: Patient is a 68 year old female who is coming in with the complaint of shoulder pain. Patient does have a past medical history significant for diabetes, chronic anticoagulation, coronary artery disease, aortic valve replacement, and peripheral neuropathy. Patient was seen previously and did have a subacromial hematoma area patient though unfortunately had a recent fall yesterday. Patient on the arm and had significant bruising and swelling medially. Patient had significant amount of pain and is unable to lift her arm she states. Patient denies any radiation of the pain past her elbow but states that the upper part of her arm is painful. Patient states it is very difficult to get comfortable last night. Patient denies any numbness in the fingers. Patient rates the severity pain is 8/10. Patient has tried heat at home which has helped minimally.  Past medical, surgical, family and social history reviewed. Medications reviewed all in the electronic medical record.   Review of Systems: No headache, visual changes, nausea, vomiting, diarrhea, constipation, dizziness, abdominal pain, skin rash, fevers, chills, night sweats, weight loss, swollen lymph nodes, body aches, joint swelling, muscle aches, chest pain, shortness of breath, mood changes.   X-rays are reviewed by me. Patient's right shoulder x-rays back in September of 2013 showed no bony abnormality. No x-rays of left shoulder  Objective:    Blood pressure 116/64, pulse 66, height 5\' 2"  (1.575 m), weight 186 lb (84.369 kg), SpO2 94.00%.   General: No apparent distress alert and oriented x3 mood and affect normal, dressed appropriately.  HEENT: Pupils equal, extraocular movements intact Respiratory: Patient's speak in full sentences and does not appear short of breath Cardiovascular: No lower extremity edema, non tender, no erythema Skin: Warm dry intact with no signs of infection or rash on extremities or on axial  skeleton. Abdomen: Soft nontender Neuro: Cranial nerves II through XII are intact, neurovascularly intact in all extremities with 2+ DTRs and 2+ pulses. Lymph: No lymphadenopathy of posterior or anterior cervical chain or axillae bilaterally.  Gait normal with good balance and coordination.  MSK: Non tender with full range of motion and good stability and symmetric strength and tone of elbows, wrist, hip, knee and ankles bilaterally.  Shoulder: Left Inspection reveals no abnormalities, the patient does have bruising of the skin of the upper arm as well as near the elbow. Palpation is normal with no tenderness over AC joint or bicipital groove. Range of motion is decreased actively to forward flexion of 80 and internal rotation to the lateral hip. Patient does have full passive range of motion but is extremely painful per patient. Rotator cuff strength unable to test secondary to pain Positive signs of impingement with negative Neer and Hawkin's tests, negative empty can sign. Speeds and Yergason's tests normal. No labral pathology noted with negative Obrien's, negative clunk and good stability. Normal scapular function observed. No painful arc and no drop arm sign. No apprehension sign Contralateral shoulder unremarkable  MSK US performed of: Left shoulder This study was ordered, performed, and interpreted by Charlann Boxer D.O.  Shoulder:   Supraspinatus:  Patient supraspinatus appears to have a partial hematoma above the tendon itself. This is filling the space and causing some compression especially under the impingement view. Patient also has calcific changes in this area. Infraspinatus:  Appears normal on long and transverse views. Mild atrophy Subscapularis:  Appears normal on long and transverse views. Teres Minor:  Appears normal on long and transverse views. AC joint:  Moderate arthritic changes Glenohumeral Joint:  Moderate arthritic changes Glenoid Labrum:  Intact without  visualized tears. Biceps Tendon:  Appears normal on long and transverse views, no fraying of tendon, tendon located in intertubercular groove, no subluxation with shoulder internal or external rotation. No increased power doppler signal. Impression: Partial tear of rotator cuff     Impression and Recommendations:     This case required medical decision making of moderate complexity.

## 2013-10-18 NOTE — Patient Instructions (Signed)
Good to see you Ice 20 minutes 3 times daily.  In 3 days start exercises daily if you can xrays downstairs today.  Come back in 1 week and we will see you again

## 2013-10-18 NOTE — Assessment & Plan Note (Signed)
Patient does have what appears to be a partial tear of the rotator cuff. Patient given home exercise program. The patient does have mostly a contusion as well of the shoulder. We'll get x-rays to rule out any humeral fracture. Patient will do icing. Patient has had trouble with pain medications previously and I did not feel comfortable making any prescription. This was then follow up in one week. As long as her strength is coming back will continue with conservative therapy. Otherwise the patient continues to have trouble we may need to consider further imaging to rule out larger tear that would need possible surgical intervention.  Spent greater than 25 minutes with patient face-to-face and had greater than 50% of counseling including as described above in assessment and plan.

## 2013-10-18 NOTE — Telephone Encounter (Signed)
Spoke with pt advised Rx sent 

## 2013-10-22 ENCOUNTER — Ambulatory Visit (INDEPENDENT_AMBULATORY_CARE_PROVIDER_SITE_OTHER): Payer: Medicare Other | Admitting: Internal Medicine

## 2013-10-22 ENCOUNTER — Encounter: Payer: Self-pay | Admitting: Internal Medicine

## 2013-10-22 VITALS — BP 136/60 | HR 62 | Temp 97.7°F | Resp 12 | Ht 62.0 in | Wt 192.0 lb

## 2013-10-22 DIAGNOSIS — J209 Acute bronchitis, unspecified: Secondary | ICD-10-CM

## 2013-10-22 MED ORDER — HYDROCODONE-HOMATROPINE 5-1.5 MG/5ML PO SYRP: 5.0000 mL | ORAL_SOLUTION | Freq: Four times a day (QID) | ORAL | Status: DC | PRN

## 2013-10-22 MED ORDER — AMOXICILLIN 500 MG PO CAPS: 500.0000 mg | ORAL_CAPSULE | Freq: Three times a day (TID) | ORAL | Status: DC

## 2013-10-22 NOTE — Progress Notes (Signed)
   Subjective:    Patient ID: Abigail Oliver, female    DOB: 08-12-1945, 68 y.o.   MRN: [id number]  HPI  Her symptoms began 10/15/13 as nonproductive cough. She was exposed to her grandson who has "pneumonitis". She was also exposed to dust cleaning out a garage.  The cough has progressed since onset. It is associated with discomfort in the chest and throat. Paroxysms of cough  last 1-3 minutes. The cough also causes posterior/occipital discomfort.  She describes scant yellow sputum. There is no significant nasal discharge. Cough is worse when supine.  She describes some paroxysmal nocturnal dyspnea 4-6 times in the last week. She's had urinary incontinence with the paroxysms of coughing. There is associated shortness of breath and wheezing.  Over-the-counter cough drops and cough syrup and daytime cough and cold preps have been of marginal benefit.  Her glucoses run in the 120s 2 hours after a meal.  Her mother had asthma.  The patient was exposed to secondhand smoke from her husband, son, and daughter.    Review of Systems  She denies itchy, watery eyes.  She has no fever, chills, sweats.  She does not describe frontal headache, facial pain.  She has pain in the left shoulder related to rotator cuff issues. This is situated associated with decreased range of motion. She has been seeing Dr. Tamala Julian.  She has scattered bruising which she relates to warfarin therapy.       Objective:   Physical Exam  She has an upper dental plate.  S4; no murmurs or gallops present. Breath sounds are slightly decreased but there are no abnormal breath sounds  Decreased range of motion left upper extremity due to pain with any movement.  Scattered faint bruises over the upper extremities.   General appearance:adequately nourished; no acute distress or increased work of breathing is present.  No  lymphadenopathy about the head, neck, or axilla noted.   Eyes: No conjunctival  inflammation or lid edema is present. There is no scleral icterus.  Ears:  External ear exam shows no significant lesions or deformities.  Otoscopic examination reveals clear canals, tympanic membranes are intact bilaterally without bulging, retraction, inflammation or discharge.  Nose:  External nasal examination shows no deformity or inflammation. Nasal mucosa are dry without lesions or exudates. No septal dislocation or deviation.No obstruction to airflow.   Oral exam: Dental hygiene is good; lips and gums are healthy appearing.There is no oropharyngeal erythema or exudate noted.   Neck:  No deformities, thyromegaly, masses, or tenderness noted.     Extremities:  No cyanosis, edema, or clubbing  noted    Skin: Warm & dry          Assessment & Plan:  #1 acute bronchitis w/o bronchospasm #2 URI, acute Plan: See orders and recommendations

## 2013-10-22 NOTE — Progress Notes (Signed)
   Subjective:    Patient ID: Abigail Oliver, female    DOB: 1946/04/05, 68 y.o.   MRN: [id number]  HPIWhen did you cough start: 10/15/13 Was there any suspected cause such as exposure to pollen, dust, chemicals fumes, smoke, or to sick individuals: Dust from cleaning out garage and 79 year old grandson with pneumonitis. Character of the cough : productive,rattly with yellow sputum Treatment for cough: OTC cough syrup "Daytime cough and cold" and cough drops with little benefit Response to treatment: Little benefit  Smoking history: Never Smoker; husband, son, daughter are exposing her to second hand smoke Past medical history of asthma, bronchitis, emphysema, tuberculosis or lung cancer in you  Any of these conditions in your family: Mother had asthma, bronchitis, and emphysema.  Not taking ACE inhibitors     Review of Systems Denies Itchy, watery eyes, sneezing Denies Fever, chills, sweats Discolored nasal secretions or discolored chest secretions: Yellow from nasal and chest Denies pain in the sinuses above the eyes or in the cheeks  below the eyes, postnasal drainage She has pain when taking a deep breath, not coughing up blood  She has shortness of breath with wheezing She has paroxysmal nocturnal dyspnea since coughing began 4-5 x at night, relieved by sitting up and taking deep breaths Denies Heart burn or reflux symptoms     Objective:   Physical Exam        Assessment & Plan:

## 2013-10-22 NOTE — Progress Notes (Signed)
Pre visit review using our clinic review tool, if applicable. No additional management support is needed unless otherwise documented below in the visit note. 

## 2013-10-22 NOTE — Patient Instructions (Signed)
Plain Mucinex (NOT D) for thick secretions ;force NON dairy fluids .  Use a Neti pot daily only  as needed for significant sinus congestion; going from open side to congested side . Plain Allegra (NOT D )  160 daily , Loratidine 10 mg , OR Zyrtec 10 mg @ bedtime  as needed for itchy eyes & sneezing. Carry room temperature water and sip liberally after coughing.

## 2013-10-24 ENCOUNTER — Ambulatory Visit (INDEPENDENT_AMBULATORY_CARE_PROVIDER_SITE_OTHER): Payer: Medicare Other | Admitting: Pharmacist Clinician (PhC)/ Clinical Pharmacy Specialist

## 2013-10-24 ENCOUNTER — Telehealth: Payer: Self-pay | Admitting: Cardiology

## 2013-10-24 DIAGNOSIS — Z954 Presence of other heart-valve replacement: Secondary | ICD-10-CM | POA: Diagnosis not present

## 2013-10-24 DIAGNOSIS — I5032 Chronic diastolic (congestive) heart failure: Secondary | ICD-10-CM

## 2013-10-24 DIAGNOSIS — Z7901 Long term (current) use of anticoagulants: Secondary | ICD-10-CM | POA: Diagnosis not present

## 2013-10-24 DIAGNOSIS — Z952 Presence of prosthetic heart valve: Secondary | ICD-10-CM

## 2013-10-24 LAB — POCT INR: INR: 3.9

## 2013-10-24 MED ORDER — FUROSEMIDE 40 MG PO TABS: 40.0000 mg | ORAL_TABLET | Freq: Two times a day (BID) | ORAL | Status: DC

## 2013-10-24 NOTE — Telephone Encounter (Signed)
New message     Pt is retaining fluid based on a decrease in her diuretic.  Should she take more furosemide (taking 1 pill a day) and decrease potassium pills (3pills a day)?  Please call

## 2013-10-24 NOTE — Telephone Encounter (Signed)
Pt states since this change was made she has increased 5 pounds, she has increase in lower extremity edema, and increase in SOB (she does have bronchitis at this time also).   She feels like she needs to increase the furosemide.  I will forward to Dr [name] for review.

## 2013-10-24 NOTE — Telephone Encounter (Signed)
Increase Lasix to 40 mg bid for now (closer to her prior torsemide dose) and have her get a BMET in 1 week.  She will need to see me or Scott in followup.

## 2013-10-24 NOTE — Telephone Encounter (Signed)
Pt states she asked her PCP to change torsemide to furosemide because torsemide was too expensive. This was changed 10/17/13 from torsemide 40mg  AM, 20mg  PM to furosemide 20mg  daily. She is also on KCL 10 mEq three daily.

## 2013-10-24 NOTE — Telephone Encounter (Signed)
Pt advised,verbalized understanding. 

## 2013-10-26 ENCOUNTER — Telehealth: Payer: Self-pay | Admitting: Cardiology

## 2013-10-26 NOTE — Telephone Encounter (Signed)
Pt's dtr left message on my voicemail that pt having a hard time breathing and she wants to bring her in today for an EKG, pls advise

## 2013-10-26 NOTE — Telephone Encounter (Signed)
Pt states has been having SOB for the last 3 days. Pt states she was taking Furosemide 40 mg   three times a day then it  was decreased to once a day, then increased  to twice a day. Pt still C/O of SOB she has bronchitis now,and  she feels like she has fluid around her heart. She has gain 8 lbs in the last 4 days. Dr [name] aware of pt's symptoms; he recommends for pt to be seen this coming Monday. Pt has an appointment with Richardson Dopp PA on Monday 10/29/13 at 10:10 AM pt aware. Pt is aware that if symptoms get worse she needs to go to the ER. Pt verbalized understanding.

## 2013-10-26 NOTE — Telephone Encounter (Signed)
New Prob    Pt states she is experiencing SOB and feels she may have some fluid around her heart. Dieretic was recently changed 2 days ago. Please call.

## 2013-10-26 NOTE — Telephone Encounter (Signed)
Left pt's daughter a message to call back.

## 2013-10-27 ENCOUNTER — Other Ambulatory Visit: Payer: Self-pay | Admitting: Internal Medicine

## 2013-10-28 ENCOUNTER — Emergency Department (HOSPITAL_COMMUNITY)
Admission: EM | Admit: 2013-10-28 | Discharge: 2013-10-28 | Disposition: A | Discharge status: Home / Self Care | Payer: Medicare Other | Source: Home / Self Care | Attending: Emergency Medicine | Admitting: Emergency Medicine

## 2013-10-28 ENCOUNTER — Emergency Department (HOSPITAL_COMMUNITY): Payer: Medicare Other

## 2013-10-28 ENCOUNTER — Encounter (HOSPITAL_COMMUNITY): Payer: Self-pay | Admitting: Emergency Medicine

## 2013-10-28 DIAGNOSIS — J90 Pleural effusion, not elsewhere classified: Secondary | ICD-10-CM | POA: Diagnosis not present

## 2013-10-28 DIAGNOSIS — K219 Gastro-esophageal reflux disease without esophagitis: Secondary | ICD-10-CM | POA: Insufficient documentation

## 2013-10-28 DIAGNOSIS — Z7901 Long term (current) use of anticoagulants: Secondary | ICD-10-CM

## 2013-10-28 DIAGNOSIS — D649 Anemia, unspecified: Secondary | ICD-10-CM | POA: Insufficient documentation

## 2013-10-28 DIAGNOSIS — S20219A Contusion of unspecified front wall of thorax, initial encounter: Secondary | ICD-10-CM | POA: Insufficient documentation

## 2013-10-28 DIAGNOSIS — Y939 Activity, unspecified: Secondary | ICD-10-CM

## 2013-10-28 DIAGNOSIS — R079 Chest pain, unspecified: Secondary | ICD-10-CM | POA: Diagnosis not present

## 2013-10-28 DIAGNOSIS — I739 Peripheral vascular disease, unspecified: Secondary | ICD-10-CM | POA: Insufficient documentation

## 2013-10-28 DIAGNOSIS — E119 Type 2 diabetes mellitus without complications: Secondary | ICD-10-CM | POA: Insufficient documentation

## 2013-10-28 DIAGNOSIS — I509 Heart failure, unspecified: Secondary | ICD-10-CM

## 2013-10-28 DIAGNOSIS — I2510 Atherosclerotic heart disease of native coronary artery without angina pectoris: Secondary | ICD-10-CM

## 2013-10-28 DIAGNOSIS — J811 Chronic pulmonary edema: Secondary | ICD-10-CM | POA: Diagnosis not present

## 2013-10-28 DIAGNOSIS — F329 Major depressive disorder, single episode, unspecified: Secondary | ICD-10-CM

## 2013-10-28 DIAGNOSIS — W1809XA Striking against other object with subsequent fall, initial encounter: Secondary | ICD-10-CM

## 2013-10-28 DIAGNOSIS — H402290 Chronic angle-closure glaucoma, unspecified eye, stage unspecified: Secondary | ICD-10-CM

## 2013-10-28 DIAGNOSIS — Y929 Unspecified place or not applicable: Secondary | ICD-10-CM

## 2013-10-28 DIAGNOSIS — F3289 Other specified depressive episodes: Secondary | ICD-10-CM | POA: Insufficient documentation

## 2013-10-28 DIAGNOSIS — E785 Hyperlipidemia, unspecified: Secondary | ICD-10-CM

## 2013-10-28 DIAGNOSIS — Z792 Long term (current) use of antibiotics: Secondary | ICD-10-CM | POA: Insufficient documentation

## 2013-10-28 DIAGNOSIS — I359 Nonrheumatic aortic valve disorder, unspecified: Secondary | ICD-10-CM

## 2013-10-28 DIAGNOSIS — Z8739 Personal history of other diseases of the musculoskeletal system and connective tissue: Secondary | ICD-10-CM

## 2013-10-28 DIAGNOSIS — Z79899 Other long term (current) drug therapy: Secondary | ICD-10-CM

## 2013-10-28 DIAGNOSIS — J189 Pneumonia, unspecified organism: Secondary | ICD-10-CM | POA: Diagnosis not present

## 2013-10-28 DIAGNOSIS — J209 Acute bronchitis, unspecified: Secondary | ICD-10-CM | POA: Diagnosis not present

## 2013-10-28 DIAGNOSIS — R0602 Shortness of breath: Secondary | ICD-10-CM | POA: Diagnosis not present

## 2013-10-28 DIAGNOSIS — J40 Bronchitis, not specified as acute or chronic: Secondary | ICD-10-CM

## 2013-10-28 LAB — CBC WITH DIFFERENTIAL/PLATELET
Basophils Absolute: 0 10*3/uL (ref 0.0–0.1)
Basophils Relative: 0 % (ref 0–1)
Eosinophils Absolute: 0.1 10*3/uL (ref 0.0–0.7)
Eosinophils Relative: 2 % (ref 0–5)
HCT: 29.1 % — ABNORMAL LOW (ref 36.0–46.0)
Hemoglobin: 8.9 g/dL — ABNORMAL LOW (ref 12.0–15.0)
Lymphocytes Relative: 28 % (ref 12–46)
Lymphs Abs: 1.7 10*3/uL (ref 0.7–4.0)
MCH: 27 pg (ref 26.0–34.0)
MCHC: 30.6 g/dL (ref 30.0–36.0)
MCV: 88.2 fL (ref 78.0–100.0)
Monocytes Absolute: 0.3 10*3/uL (ref 0.1–1.0)
Monocytes Relative: 6 % (ref 3–12)
Neutro Abs: 4 10*3/uL (ref 1.7–7.7)
Neutrophils Relative %: 64 % (ref 43–77)
Platelets: 195 10*3/uL (ref 150–400)
RBC: 3.3 MIL/uL — ABNORMAL LOW (ref 3.87–5.11)
RDW: 16.3 % — ABNORMAL HIGH (ref 11.5–15.5)
WBC: 6.2 10*3/uL (ref 4.0–10.5)

## 2013-10-28 LAB — BASIC METABOLIC PANEL
BUN: 9 mg/dL (ref 6–23)
CO2: 24 mEq/L (ref 19–32)
Calcium: 9.4 mg/dL (ref 8.4–10.5)
Chloride: 106 mEq/L (ref 96–112)
Creatinine, Ser: 0.69 mg/dL (ref 0.50–1.10)
GFR calc Af Amer: >90 mL/min (ref >90)
GFR calc non Af Amer: 87 mL/min — ABNORMAL LOW (ref >90)
Glucose, Bld: 109 mg/dL — ABNORMAL HIGH (ref 70–99)
Potassium: 4.1 mEq/L (ref 3.7–5.3)
Sodium: 143 mEq/L (ref 137–147)

## 2013-10-28 LAB — TROPONIN I: Troponin I: <0.3 ng/mL (ref <0.30)

## 2013-10-28 LAB — POC OCCULT BLOOD, ED: Fecal Occult Bld: NEGATIVE

## 2013-10-28 MED ORDER — AEROCHAMBER PLUS FLO-VU MEDIUM MISC
1.0000 | Freq: Once | Status: AC
  Administered 2013-10-28: 1
  Filled 2013-10-28 (×2): qty 1

## 2013-10-28 MED ORDER — FERROUS SULFATE 325 (65 FE) MG PO TABS: 325.0000 mg | ORAL_TABLET | Freq: Every day | ORAL | Status: DC

## 2013-10-28 MED ORDER — SODIUM CHLORIDE 0.9 % IV SOLN
INTRAVENOUS | Status: DC
  Administered 2013-10-28: 15:00:00 via INTRAVENOUS

## 2013-10-28 MED ORDER — ALBUTEROL SULFATE HFA 108 (90 BASE) MCG/ACT IN AERS
2.0000 | INHALATION_SPRAY | RESPIRATORY_TRACT | Status: DC
  Administered 2013-10-28: 2 via RESPIRATORY_TRACT
  Filled 2013-10-28: qty 6.7

## 2013-10-28 NOTE — Discharge Instructions (Signed)
Please followup with your doctor as scheduled. Take iron for anemia. Return if you have any change in your shortness of breath or chest pain not associated with where you fell. Continue your antibiotic as your Dr. prescribed and use inhaler as needed to

## 2013-10-28 NOTE — ED Notes (Signed)
Patient is alert and orientedx4.  Patient was explained discharge instructions and they understood them with no questions.  The patient's husband, Abigail Oliver is taking the patient home.

## 2013-10-28 NOTE — ED Notes (Signed)
Patient transported to X-ray 

## 2013-10-28 NOTE — ED Notes (Signed)
Family at bedside. 

## 2013-10-28 NOTE — ED Notes (Signed)
Patient said she went to her PCP and was diagnosed with Bronchitis. She said she is getting worse instead of better so she came here to be evaluated.

## 2013-10-28 NOTE — ED Provider Notes (Signed)
CSN: [id number]     Arrival date & time 10/28/13  1300 History   First MD Initiated Contact with Patient 10/28/13 1309     Chief Complaint  Patient presents with  . Shortness of Breath     (Consider location/radiation/quality/duration/timing/severity/associated sxs/prior Treatment) HPI 68 y.o. Female with history of cad/s/p St Jude valve presents with 4 days history of cough and sob.  States seen at pmd on Wednesday and diagnosed with bronchitis- placed on amoxicillin and tylenol with codeine.  Fell against arm chair with left chest on Thursday and heard popping noise.  Pain at site of injury.   Continues to have worsening sob with cough productive of whitish sputum and some dyspnea.  Patient afebrile, no chills, no history of similar problems. Taking all meds as prescribed.   Past Medical History  Diagnosis Date  . Coronary atherosclerosis of native coronary artery   . Hyperlipidemia   . Chronic angle-closure glaucoma(365.23)   . Aortic valve disorders   . Peripheral vascular disease   . Type II diabetes mellitus   . Depression   . PONV (postoperative nausea and vomiting)     N&V  . CHF (congestive heart failure)     diastolic heart failure  . GERD (gastroesophageal reflux disease)   . Neuromuscular disorder     neuropathy  . Arthritis     osteo  . Anemia   . Syncope 03/29/2012    carotid doppler - R ICA 50-69% reduction by velocities (low end); L ICA 0-49% reduction by velocities (low end); R and L subclavian arteries - <50% redcution; R and L vertebral arteries show normal antegrade flow  . Dizziness 02/09/2012    Holter monitor - sinus rhythm, no ventricular ectopic beats, no sulpraventricular ectopic beats noted  . OSA (obstructive sleep apnea)    Past Surgical History  Procedure Laterality Date  . Carpal tunnel release  1989    bilaterally  . Cholecystectomy  1990  . Peripheral arterial stent graft  09/20/11    right SFA  . Cardiac valve replacement  2000    aortic  valve   . Laceration repair      right hand  . Neuroplasty / transposition ulnar nerve at elbow      right  . Tonsillectomy  1968  . Vaginal hysterectomy  1985    Fibroids  . Bilateral oophorectomy  1987  . Cesarean section  1969; 1971  . Breast biopsy  2012    left  . Lymph node biopsy  06/2011    "core needle on 5"  . Cataract extraction w/ intraocular lens  implant, bilateral  ~ 2007  . Refractive surgery  ` 2004    "for glaucoma"  . Needle biopsy  2009    "on ankles for nerve damage"  . Esophagogastroduodenoscopy N/A 10/27/2012    Procedure: ESOPHAGOGASTRODUODENOSCOPY (EGD);  Surgeon: Beryle Beams, MD;  Location: [facility];  Service: Endoscopy;  Laterality: N/A;  . Colonoscopy N/A 10/27/2012    Procedure: COLONOSCOPY;  Surgeon: Beryle Beams, MD;  Location: [facility];  Service: Endoscopy;  Laterality: N/A;  . Doppler echocardiography  03/29/2012    EF >55%; mild concentric LVH; stage 1 diastolic dysfunction, elevated LV filling pressure, dilated LA; MAC mild MR; St Jude AVR peak and mean gradients of 46mmHg and 53mmHg; transvalvular gradients have increased (prev 23 and 14 respectively)  . Cardiovascular stress test  08/25/2011    R/L MV - EF 72%; no scintigraphic evidence of inducible MI;  normal perfusionTID of 1.25 elevated - could indicate small vessle subendocardial ischemia; EKG NSR at 66, non diagnostic for ischemia  . Cardiac catheterization  01/14/1999    normal LV function, severe aortic stenosis; 80% and 70% stenosis in RCA; mild 20% distal norrowing in L main with 20% proximal LAD stenosis, 40% diagonal stenosis and 20% proximal circumflex stenosis  . Femoral artery stent  09/20/2011    6 x 40 Smart Nitinol self-expanding stent placed;  10/15/2031 -R SFA stent open and patent w/o evidence of restenosis  . Coronary artery bypass graft  2000    RIMA to RCA   Family History  Problem Relation Age of Onset  . Cancer Mother     Colon  . COPD Mother   . Emphysema Mother    . Diabetes Neg Hx   . Cancer Maternal Grandmother   . Cancer Maternal Grandfather   . Heart disease Brother    History  Substance Use Topics  . Smoking status: Never Smoker   . Smokeless tobacco: Never Used  . Alcohol Use: No   OB History   Grav Para Term Preterm Abortions TAB SAB Ect Mult Living                 Review of Systems  All other systems reviewed and are negative.     Allergies  Atorvastatin; Simvastatin; Sulfa antibiotics; and Iodinated diagnostic agents  Home Medications   Prior to Admission medications   Medication Sig Start Date End Date Taking? Authorizing Provider  amoxicillin (AMOXIL) 500 MG capsule Take 1 capsule (500 mg total) by mouth 3 (three) times daily. 10/22/13   Hendricks Limes, MD  Ascorbic Acid (VITAMIN C) 500 MG tablet Take 500 mg by mouth 2 (two) times daily.     Historical Provider, MD  calcium-vitamin D (CALCIUM 500+D) 500-200 MG-UNIT per tablet Take 1 tablet by mouth 2 (two) times daily.     Historical Provider, MD  citalopram (CELEXA) 20 MG tablet TAKE 1 TABLET BY MOUTH EVERY DAY 10/27/13   Hendricks Limes, MD  folic acid (FOLVITE) 1 MG tablet Take 1 tablet (1 mg total) by mouth daily. 09/05/13   Rowe Clack, MD  furosemide (LASIX) 40 MG tablet Take 1 tablet (40 mg total) by mouth 2 (two) times daily. 10/24/13   Larey Dresser, MD  gabapentin (NEURONTIN) 300 MG capsule Take 1 capsule (300 mg total) by mouth at bedtime. 06/28/13   Neena Rhymes, MD  HYDROcodone-homatropine (HYDROMET) 5-1.5 MG/5ML syrup Take 5 mLs by mouth every 6 (six) hours as needed for cough. 10/22/13   Hendricks Limes, MD  latanoprost (XALATAN) 0.005 % ophthalmic solution Place 1 drop into both eyes at bedtime.    Historical Provider, MD  metFORMIN (GLUCOPHAGE) 500 MG tablet TAKE 2 TABLET BY MOUTH TWICE DAILY WITH A MEAL 02/23/13   Neena Rhymes, MD  metoprolol succinate (TOPROL-XL) 50 MG 24 hr tablet Take 1 tablet (50 mg total) by mouth 2 (two) times daily. 04/09/13    Larey Dresser, MD  pantoprazole (PROTONIX) 40 MG tablet Take 40 mg by mouth 2 (two) times daily as needed. 10/24/12   Neena Rhymes, MD  potassium chloride (K-DUR,KLOR-CON) 10 MEQ tablet TAKE 2 TABLETS BY MOUTH EVERY MORNING AND 1 TABLET IN THE EVENING. 05/06/13   Larey Dresser, MD  simvastatin (ZOCOR) 40 MG tablet Take 40 mg by mouth daily. 09/08/13   Historical Provider, MD  traMADol (ULTRAM) 50 MG tablet  Take 1 tablet (50 mg total) by mouth every 8 (eight) hours as needed for pain. 09/11/12   Rowe Clack, MD  traZODone (DESYREL) 50 MG tablet Take 0.5-1 tablets (25-50 mg total) by mouth at bedtime as needed for sleep. 09/27/13   Rowe Clack, MD  warfarin (COUMADIN) 3 MG tablet TAKE 1 TABLET BY MOUTH EVERY DAY OR AS DIRECTED 04/24/13   Troy Sine, MD   BP 140/58  Pulse 69  Temp(Src) 98.6 F (37 C) (Oral)  Resp 22  Ht 5\' 2"  (1.575 m)  Wt 188 lb (85.276 kg)  BMI 34.38 kg/m2  SpO2 95% Physical Exam  Nursing note and vitals reviewed. Constitutional: She is oriented to person, place, and time. She appears well-developed and well-nourished.  HENT:  Head: Normocephalic and atraumatic.  Right Ear: External ear normal.  Left Ear: External ear normal.  Nose: Nose normal.  Mouth/Throat: Oropharynx is clear and moist.  Eyes: Conjunctivae and EOM are normal. Pupils are equal, round, and reactive to light.  Neck: Normal range of motion. Neck supple.  Cardiovascular: Normal rate and regular rhythm.   click  Pulmonary/Chest: Effort normal.  Coarse bs throughout,no wheezing  Abdominal: Soft. Bowel sounds are normal.  Musculoskeletal: Normal range of motion.  Neurological: She is alert and oriented to person, place, and time. She has normal reflexes.  Skin: Skin is warm and dry.  Psychiatric: She has a normal mood and affect. Judgment and thought content normal.    ED Course  Procedures (including critical care time) Labs Review Labs Reviewed  CBC WITH DIFFERENTIAL -  Abnormal; Notable for the following:    RBC 3.30 (*)    Hemoglobin 8.9 (*)    HCT 29.1 (*)    RDW 16.3 (*)    All other components within normal limits  BASIC METABOLIC PANEL - Abnormal; Notable for the following:    Glucose, Bld 109 (*)    GFR calc non Af Amer 87 (*)    All other components within normal limits  TROPONIN I  OCCULT BLOOD X 1 CARD TO LAB, STOOL  POC OCCULT BLOOD, ED    Imaging Review No results found.   EKG Interpretation   Date/Time:  Sunday October 28 2013 13:06:57 EDT Ventricular Rate:  69 PR Interval:  162 QRS Duration: 70 QT Interval:  436 QTC Calculation: 467 R Axis:   35 Text Interpretation:  Normal sinus rhythm Cannot rule out Anterior infarct  , age undetermined Abnormal ECG Poor data quality Confirmed by Candance Bohlman MD,  Andee Poles [phone]) on 10/28/2013 1:09:58 PM      MDM   Final diagnoses:  Bronchitis  Anemia  Chest wall contusion   Patient here with cough, afebrile, with mild anemia.  I have discussed the results with the patient and she wishes to be discharged.  She has close follow up with cardiology tomorrow and is seen by [facility] primary care.  She states the only chest pain she has had is where she hit her chest.  Hemocult is negative.  1-cough/bronchitis- patient on amoxicillin.  Plan albuterol mdi. 2-anemia- patient with some history of anemia, but last documented 12.  Hemocult negative .  Will rx iron and advise close follow up. 3- chest wall pain.     Shaune Pollack, MD 10/29/13 [phone]

## 2013-10-28 NOTE — ED Notes (Signed)
Fell 1 week hitting left lower ribs has had pain to chest with sob and harsh cough for 1 week

## 2013-10-29 ENCOUNTER — Emergency Department (HOSPITAL_COMMUNITY): Payer: Medicare Other

## 2013-10-29 ENCOUNTER — Inpatient Hospital Stay (HOSPITAL_COMMUNITY)
Admission: EM | Admit: 2013-10-29 | Discharge: 2013-10-31 | DRG: 291 | Disposition: A | Discharge status: Home / Self Care | Payer: Medicare Other | Attending: Internal Medicine | Admitting: Internal Medicine

## 2013-10-29 ENCOUNTER — Encounter: Payer: Medicare Other | Admitting: Physician Assistant

## 2013-10-29 ENCOUNTER — Encounter: Payer: Self-pay | Admitting: Internal Medicine

## 2013-10-29 ENCOUNTER — Telehealth: Payer: Self-pay | Admitting: Internal Medicine

## 2013-10-29 ENCOUNTER — Ambulatory Visit (INDEPENDENT_AMBULATORY_CARE_PROVIDER_SITE_OTHER): Payer: Medicare Other | Admitting: Internal Medicine

## 2013-10-29 ENCOUNTER — Encounter (HOSPITAL_COMMUNITY): Payer: Self-pay | Admitting: Emergency Medicine

## 2013-10-29 VITALS — BP 160/60 | HR 69 | Temp 98.1°F | Resp 16 | Ht 62.0 in | Wt 188.8 lb

## 2013-10-29 DIAGNOSIS — R1314 Dysphagia, pharyngoesophageal phase: Secondary | ICD-10-CM | POA: Diagnosis present

## 2013-10-29 DIAGNOSIS — J811 Chronic pulmonary edema: Secondary | ICD-10-CM | POA: Diagnosis not present

## 2013-10-29 DIAGNOSIS — R6881 Early satiety: Secondary | ICD-10-CM

## 2013-10-29 DIAGNOSIS — E1142 Type 2 diabetes mellitus with diabetic polyneuropathy: Secondary | ICD-10-CM | POA: Diagnosis present

## 2013-10-29 DIAGNOSIS — R55 Syncope and collapse: Secondary | ICD-10-CM

## 2013-10-29 DIAGNOSIS — E1149 Type 2 diabetes mellitus with other diabetic neurological complication: Secondary | ICD-10-CM | POA: Diagnosis present

## 2013-10-29 DIAGNOSIS — Z79899 Other long term (current) drug therapy: Secondary | ICD-10-CM

## 2013-10-29 DIAGNOSIS — D509 Iron deficiency anemia, unspecified: Secondary | ICD-10-CM | POA: Diagnosis present

## 2013-10-29 DIAGNOSIS — H8309 Labyrinthitis, unspecified ear: Secondary | ICD-10-CM

## 2013-10-29 DIAGNOSIS — K219 Gastro-esophageal reflux disease without esophagitis: Secondary | ICD-10-CM | POA: Diagnosis present

## 2013-10-29 DIAGNOSIS — Z954 Presence of other heart-valve replacement: Secondary | ICD-10-CM

## 2013-10-29 DIAGNOSIS — Z951 Presence of aortocoronary bypass graft: Secondary | ICD-10-CM | POA: Diagnosis not present

## 2013-10-29 DIAGNOSIS — E119 Type 2 diabetes mellitus without complications: Secondary | ICD-10-CM | POA: Diagnosis present

## 2013-10-29 DIAGNOSIS — R918 Other nonspecific abnormal finding of lung field: Secondary | ICD-10-CM | POA: Diagnosis present

## 2013-10-29 DIAGNOSIS — R05 Cough: Secondary | ICD-10-CM

## 2013-10-29 DIAGNOSIS — R310 Gross hematuria: Secondary | ICD-10-CM

## 2013-10-29 DIAGNOSIS — M7730 Calcaneal spur, unspecified foot: Secondary | ICD-10-CM

## 2013-10-29 DIAGNOSIS — I5189 Other ill-defined heart diseases: Secondary | ICD-10-CM

## 2013-10-29 DIAGNOSIS — I2510 Atherosclerotic heart disease of native coronary artery without angina pectoris: Secondary | ICD-10-CM

## 2013-10-29 DIAGNOSIS — I5032 Chronic diastolic (congestive) heart failure: Secondary | ICD-10-CM | POA: Diagnosis present

## 2013-10-29 DIAGNOSIS — I509 Heart failure, unspecified: Secondary | ICD-10-CM | POA: Diagnosis not present

## 2013-10-29 DIAGNOSIS — H402290 Chronic angle-closure glaucoma, unspecified eye, stage unspecified: Secondary | ICD-10-CM

## 2013-10-29 DIAGNOSIS — M810 Age-related osteoporosis without current pathological fracture: Secondary | ICD-10-CM | POA: Diagnosis present

## 2013-10-29 DIAGNOSIS — E785 Hyperlipidemia, unspecified: Secondary | ICD-10-CM | POA: Diagnosis present

## 2013-10-29 DIAGNOSIS — F418 Other specified anxiety disorders: Secondary | ICD-10-CM | POA: Diagnosis present

## 2013-10-29 DIAGNOSIS — M5126 Other intervertebral disc displacement, lumbar region: Secondary | ICD-10-CM

## 2013-10-29 DIAGNOSIS — H4020X0 Unspecified primary angle-closure glaucoma, stage unspecified: Secondary | ICD-10-CM | POA: Diagnosis present

## 2013-10-29 DIAGNOSIS — R059 Cough, unspecified: Secondary | ICD-10-CM

## 2013-10-29 DIAGNOSIS — I519 Heart disease, unspecified: Secondary | ICD-10-CM

## 2013-10-29 DIAGNOSIS — I5033 Acute on chronic diastolic (congestive) heart failure: Principal | ICD-10-CM | POA: Diagnosis present

## 2013-10-29 DIAGNOSIS — F329 Major depressive disorder, single episode, unspecified: Secondary | ICD-10-CM | POA: Diagnosis present

## 2013-10-29 DIAGNOSIS — M75112 Incomplete rotator cuff tear or rupture of left shoulder, not specified as traumatic: Secondary | ICD-10-CM

## 2013-10-29 DIAGNOSIS — R0609 Other forms of dyspnea: Secondary | ICD-10-CM | POA: Diagnosis not present

## 2013-10-29 DIAGNOSIS — F3289 Other specified depressive episodes: Secondary | ICD-10-CM | POA: Diagnosis present

## 2013-10-29 DIAGNOSIS — J189 Pneumonia, unspecified organism: Secondary | ICD-10-CM | POA: Diagnosis present

## 2013-10-29 DIAGNOSIS — I739 Peripheral vascular disease, unspecified: Secondary | ICD-10-CM | POA: Diagnosis present

## 2013-10-29 DIAGNOSIS — R079 Chest pain, unspecified: Secondary | ICD-10-CM

## 2013-10-29 DIAGNOSIS — R0989 Other specified symptoms and signs involving the circulatory and respiratory systems: Secondary | ICD-10-CM

## 2013-10-29 DIAGNOSIS — I10 Essential (primary) hypertension: Secondary | ICD-10-CM | POA: Diagnosis present

## 2013-10-29 DIAGNOSIS — Z5181 Encounter for therapeutic drug level monitoring: Secondary | ICD-10-CM

## 2013-10-29 DIAGNOSIS — Z7901 Long term (current) use of anticoagulants: Secondary | ICD-10-CM

## 2013-10-29 DIAGNOSIS — I359 Nonrheumatic aortic valve disorder, unspecified: Secondary | ICD-10-CM | POA: Diagnosis not present

## 2013-10-29 DIAGNOSIS — J9600 Acute respiratory failure, unspecified whether with hypoxia or hypercapnia: Secondary | ICD-10-CM | POA: Diagnosis not present

## 2013-10-29 DIAGNOSIS — J9601 Acute respiratory failure with hypoxia: Secondary | ICD-10-CM | POA: Diagnosis present

## 2013-10-29 DIAGNOSIS — Z952 Presence of prosthetic heart valve: Secondary | ICD-10-CM

## 2013-10-29 DIAGNOSIS — G43809 Other migraine, not intractable, without status migrainosus: Secondary | ICD-10-CM

## 2013-10-29 DIAGNOSIS — M75102 Unspecified rotator cuff tear or rupture of left shoulder, not specified as traumatic: Secondary | ICD-10-CM

## 2013-10-29 DIAGNOSIS — M159 Polyosteoarthritis, unspecified: Secondary | ICD-10-CM

## 2013-10-29 DIAGNOSIS — G609 Hereditary and idiopathic neuropathy, unspecified: Secondary | ICD-10-CM | POA: Diagnosis present

## 2013-10-29 DIAGNOSIS — R221 Localized swelling, mass and lump, neck: Secondary | ICD-10-CM

## 2013-10-29 DIAGNOSIS — R0600 Dyspnea, unspecified: Secondary | ICD-10-CM

## 2013-10-29 DIAGNOSIS — F331 Major depressive disorder, recurrent, moderate: Secondary | ICD-10-CM | POA: Diagnosis present

## 2013-10-29 DIAGNOSIS — J90 Pleural effusion, not elsewhere classified: Secondary | ICD-10-CM | POA: Diagnosis not present

## 2013-10-29 DIAGNOSIS — R42 Dizziness and giddiness: Secondary | ICD-10-CM

## 2013-10-29 LAB — PRO B NATRIURETIC PEPTIDE: Pro B Natriuretic peptide (BNP): 2638 pg/mL — ABNORMAL HIGH (ref 0–125)

## 2013-10-29 LAB — BASIC METABOLIC PANEL
BUN: 7 mg/dL (ref 6–23)
CO2: 20 mEq/L (ref 19–32)
Calcium: 9.6 mg/dL (ref 8.4–10.5)
Chloride: 106 mEq/L (ref 96–112)
Creatinine, Ser: 0.72 mg/dL (ref 0.50–1.10)
GFR calc Af Amer: >90 mL/min (ref >90)
GFR calc non Af Amer: 86 mL/min — ABNORMAL LOW (ref >90)
Glucose, Bld: 95 mg/dL (ref 70–99)
Potassium: 4.5 mEq/L (ref 3.7–5.3)
Sodium: 142 mEq/L (ref 137–147)

## 2013-10-29 LAB — CBC
HCT: 31.2 % — ABNORMAL LOW (ref 36.0–46.0)
Hemoglobin: 9.9 g/dL — ABNORMAL LOW (ref 12.0–15.0)
MCH: 27.8 pg (ref 26.0–34.0)
MCHC: 31.7 g/dL (ref 30.0–36.0)
MCV: 87.6 fL (ref 78.0–100.0)
Platelets: 198 10*3/uL (ref 150–400)
RBC: 3.56 MIL/uL — ABNORMAL LOW (ref 3.87–5.11)
RDW: 16.1 % — ABNORMAL HIGH (ref 11.5–15.5)
WBC: 7.3 10*3/uL (ref 4.0–10.5)

## 2013-10-29 LAB — GLUCOSE, CAPILLARY: Glucose-Capillary: 111 mg/dL — ABNORMAL HIGH (ref 70–99)

## 2013-10-29 LAB — PROTIME-INR
INR: 3.73 — ABNORMAL HIGH (ref 0.00–1.49)
Prothrombin Time: 35.5 seconds — ABNORMAL HIGH (ref 11.6–15.2)

## 2013-10-29 LAB — STREP PNEUMONIAE URINARY ANTIGEN: Strep Pneumo Urinary Antigen: NEGATIVE

## 2013-10-29 LAB — I-STAT TROPONIN, ED: Troponin i, poc: 0.01 ng/mL (ref 0.00–0.08)

## 2013-10-29 MED ORDER — METHYLPREDNISOLONE SODIUM SUCC 125 MG IJ SOLR
INTRAMUSCULAR | Status: AC
  Filled 2013-10-29: qty 2

## 2013-10-29 MED ORDER — TRAZODONE HCL 50 MG PO TABS
25.0000 mg | ORAL_TABLET | Freq: Every evening | ORAL | Status: DC | PRN
  Administered 2013-10-29 – 2013-10-31 (×2): 50 mg via ORAL
  Filled 2013-10-29 (×2): qty 1

## 2013-10-29 MED ORDER — GABAPENTIN 300 MG PO CAPS
300.0000 mg | ORAL_CAPSULE | Freq: Every day | ORAL | Status: DC
  Filled 2013-10-29 (×2): qty 1

## 2013-10-29 MED ORDER — PANTOPRAZOLE SODIUM 40 MG PO TBEC
40.0000 mg | DELAYED_RELEASE_TABLET | Freq: Two times a day (BID) | ORAL | Status: DC
  Administered 2013-10-30 (×2): 40 mg via ORAL
  Filled 2013-10-29 (×3): qty 1

## 2013-10-29 MED ORDER — LATANOPROST 0.005 % OP SOLN
1.0000 [drp] | Freq: Every day | OPHTHALMIC | Status: DC
  Administered 2013-10-29 – 2013-10-30 (×2): 1 [drp] via OPHTHALMIC
  Filled 2013-10-29: qty 2.5

## 2013-10-29 MED ORDER — POTASSIUM CHLORIDE ER 10 MEQ PO TBCR
10.0000 meq | EXTENDED_RELEASE_TABLET | Freq: Every day | ORAL | Status: DC
  Administered 2013-10-29 – 2013-10-30 (×2): 10 meq via ORAL
  Filled 2013-10-29 (×3): qty 1

## 2013-10-29 MED ORDER — FUROSEMIDE 10 MG/ML IJ SOLN
80.0000 mg | Freq: Once | INTRAMUSCULAR | Status: AC
  Administered 2013-10-29: 80 mg via INTRAVENOUS
  Filled 2013-10-29: qty 8

## 2013-10-29 MED ORDER — VANCOMYCIN HCL IN DEXTROSE 1-5 GM/200ML-% IV SOLN
1000.0000 mg | Freq: Two times a day (BID) | INTRAVENOUS | Status: DC
  Filled 2013-10-29: qty 200

## 2013-10-29 MED ORDER — DEXTROSE 5 % IV SOLN
1.0000 g | INTRAVENOUS | Status: DC
  Administered 2013-10-29 – 2013-10-30 (×2): 1 g via INTRAVENOUS
  Filled 2013-10-29 (×3): qty 10

## 2013-10-29 MED ORDER — FUROSEMIDE 10 MG/ML IJ SOLN
40.0000 mg | Freq: Two times a day (BID) | INTRAMUSCULAR | Status: DC
  Administered 2013-10-30: 40 mg via INTRAVENOUS
  Filled 2013-10-29 (×3): qty 4

## 2013-10-29 MED ORDER — DEXTROSE 5 % IV SOLN
1.0000 g | Freq: Once | INTRAVENOUS | Status: AC
  Administered 2013-10-29: 1 g via INTRAVENOUS
  Filled 2013-10-29: qty 1

## 2013-10-29 MED ORDER — METOPROLOL SUCCINATE ER 50 MG PO TB24
50.0000 mg | ORAL_TABLET | Freq: Two times a day (BID) | ORAL | Status: DC
  Administered 2013-10-29 – 2013-10-31 (×4): 50 mg via ORAL
  Filled 2013-10-29 (×5): qty 1

## 2013-10-29 MED ORDER — FOLIC ACID 1 MG PO TABS
1.0000 mg | ORAL_TABLET | Freq: Every day | ORAL | Status: DC
  Administered 2013-10-29 – 2013-10-31 (×3): 1 mg via ORAL
  Filled 2013-10-29 (×3): qty 1

## 2013-10-29 MED ORDER — POTASSIUM CHLORIDE CRYS ER 20 MEQ PO TBCR
20.0000 meq | EXTENDED_RELEASE_TABLET | Freq: Every day | ORAL | Status: DC
  Administered 2013-10-30 – 2013-10-31 (×2): 20 meq via ORAL
  Filled 2013-10-29 (×2): qty 1

## 2013-10-29 MED ORDER — INSULIN ASPART 100 UNIT/ML ~~LOC~~ SOLN
0.0000 [IU] | Freq: Three times a day (TID) | SUBCUTANEOUS | Status: DC
  Administered 2013-10-30 (×2): 2 [IU] via SUBCUTANEOUS
  Administered 2013-10-31: 1 [IU] via SUBCUTANEOUS

## 2013-10-29 MED ORDER — METHYLPREDNISOLONE SODIUM SUCC 125 MG IJ SOLR
125.0000 mg | Freq: Once | INTRAMUSCULAR | Status: AC
  Administered 2013-10-29: 125 mg via INTRAVENOUS
  Filled 2013-10-29: qty 2

## 2013-10-29 MED ORDER — DEXTROSE 5 % IV SOLN
500.0000 mg | INTRAVENOUS | Status: DC
  Administered 2013-10-29 – 2013-10-30 (×2): 500 mg via INTRAVENOUS
  Filled 2013-10-29 (×3): qty 500

## 2013-10-29 MED ORDER — SIMVASTATIN 40 MG PO TABS
40.0000 mg | ORAL_TABLET | Freq: Every day | ORAL | Status: DC
  Administered 2013-10-30 – 2013-10-31 (×2): 40 mg via ORAL
  Filled 2013-10-29 (×2): qty 1

## 2013-10-29 MED ORDER — WARFARIN - PHARMACIST DOSING INPATIENT: Freq: Every day | Status: DC

## 2013-10-29 MED ORDER — FERROUS SULFATE 325 (65 FE) MG PO TABS
325.0000 mg | ORAL_TABLET | Freq: Every day | ORAL | Status: DC
  Administered 2013-10-30 – 2013-10-31 (×2): 325 mg via ORAL
  Filled 2013-10-29 (×2): qty 1

## 2013-10-29 MED ORDER — HYDROCODONE-HOMATROPINE 5-1.5 MG/5ML PO SYRP
5.0000 mL | ORAL_SOLUTION | Freq: Four times a day (QID) | ORAL | Status: DC | PRN
Start: 2013-10-29 — End: 2013-10-31

## 2013-10-29 MED ORDER — CALCIUM CARBONATE-VITAMIN D 500-200 MG-UNIT PO TABS
1.0000 | ORAL_TABLET | Freq: Two times a day (BID) | ORAL | Status: DC
  Administered 2013-10-29 – 2013-10-31 (×4): 1 via ORAL
  Filled 2013-10-29 (×5): qty 1

## 2013-10-29 NOTE — ED Notes (Signed)
Called EKG team for EKG crossover. Dr. Rogene Houston has seen hard copy and is assessing patietn at bedside.

## 2013-10-29 NOTE — ED Notes (Signed)
Pt reports SOB, for several days. Was seen here yesterday, told she had fluid on her lung. Sent here from PCP today for cardiac work up. Denies CP. Pt has cough. Pt is a x 4

## 2013-10-29 NOTE — Telephone Encounter (Signed)
Patient Information:  Caller Name: Illyana  Phone: [phone]  Patient: Abigail Oliver, Abigail Oliver  Gender: Female  DOB: 04-16-1946  Age: 68 Years  PCP: Other  Office Follow Up:  Does the office need to follow up with this patient?: No  Instructions For The Office: N/A  RN Note:  FBS 112 at 0800. Albuterol MDI only effective for about one hour. Continues to be short of breath with coughing and wheezing. Respirations 24 rpm.  Symptoms  Reason For Call & Symptoms: Dr Linna Darner diagnosed bronchitis 10/26/13.  Went to [facility] ED 10/28/13 because was unable to breathe.  Diagnosed with bronchitis; treated with Albuterol.  Chest Xray showed fluid around the lung and edema.  Since symptoms were respiratory, not cardiac, she cancelled cardiology appointment for 10/29/13.  Reports symptoms are not improving.  Reviewed Health History In EMR: Yes  Reviewed Medications In EMR: Yes  Reviewed Allergies In EMR: Yes  Reviewed Surgeries / Procedures: Yes  Date of Onset of Symptoms: 10/15/2013  Treatments Tried: Albuterol MDI every 4 hours, Allegra, Mucinex, Codeine cough Rx,  Amoxicillin 10/26/13  Treatments Tried Worked: Yes  Guideline(s) Used:  Cough  Disposition Per Guideline:   Go to Office Now  Reason For Disposition Reached:   Wheezing is present  Advice Given:  Reassurance  Coughing is the way that our lungs remove irritants and mucus. It helps protect our lungs from getting pneumonia.  Cough Medicines:  Home Remedy - Hard Candy: Hard candy works just as well as medicine-flavored OTC cough drops. Diabetics should use sugar-free candy.  Coughing Spasms:  Drink warm fluids. Inhale warm mist (Reason: both relax the airway and loosen up the phlegm).  Prevent Dehydration:  Drink adequate liquids.  This will help soothe an irritated or dry throat and loosen up the phlegm.  Expected Course:   The expected course depends on what is causing the cough.  Viral bronchitis (chest cold) causes a cough that  lasts 1 to 3 weeks. Sometimes you may cough up lots of phlegm (sputum, mucus). The mucus can normally be white, gray, yellow, or green.  Call Back If:  Difficulty breathing  Cough lasts more than 3 weeks  Fever lasts > 3 days  You become worse.  Patient Will Follow Care Advice:  YES  Appointment Scheduled:  10/29/2013 14:30:00 Appointment Scheduled Provider:  Scarlette Calico (Adults only)

## 2013-10-29 NOTE — Progress Notes (Signed)
This encounter was created in error - please disregard.

## 2013-10-29 NOTE — ED Notes (Signed)
Hospitalist consult at bedside. 

## 2013-10-29 NOTE — H&P (Addendum)
Hospitalist Admission History and Physical  Patient name: Abigail Oliver Medical record number: [id number] Date of birth: [birth year] Age: 68 y.o. Gender: female  Primary Care Provider: Vertell Novak, MD  Chief Complaint: Acute respiratory failure with hypoxia, community-acquired pneumonia, CHF exacerbation  History of Present Illness:This is a 68 y.o. year old female with prior history of coronary artery disease status post CABG in 9371, grade 2 diastolic dysfunction, type 2 diabetes, aortic valve disease status post mechanical aortic valve replacement 2000 on Coumadin presenting with acute respiratory failure with hypoxia. Patient states that she developed a mild respiratory infection about 3-4 weeks ago with symptoms including cough and rhinorrhea. Was seen by PCP 2-3 weeks ago with symptoms. Was placed on amoxicillin at second visit. Patient states the cough persists despite treatment. Patient was also switched from torsemide to Lasix because of cost issues about one week ago. Patient states that since which she's had progressive lower extremity swelling and worsening orthopnea. Her dry weight went from 180s to the 190s. Was put back on twice a day Lasix about 2 days ago. Patient denies taking NSAIDs or eating salty foods. Was seen in ER yesterday for her dyspnea. Chest x-ray was negative the patient was subsequently discharged home. Follow up in the PCPs office today with worsening dyspnea and was directed back to the ER. On presentation, MAXIMUM TEMPERATURE 100.2. Heart rate in the 40s to 60s. Pressure in the 120s to 170s. Satting in the upper 80s on room air. White blood cell count 7.3. Hemoglobin 9.9. Creatinine 0.72. INR 3.73. ProBNP 2600.  Initial chest x-ray showed interstitial edema. Followup chest CT show bilateral pleural effusions with mild CHF as well as right lower lobe pneumonia and scattered bilateral pulmonary nodules. Patient start vancomycin cefepime by ER physician. Also  given 80 mg of IV Lasix x1.  Assessment and Plan: Abigail Oliver is a 68 y.o. year old female presenting with acute respiratory failure with hypoxia, community acquired pneumonia, CHF exacerbation  Acute respiratory failure with hypoxia: Likely multifactorial contributions of CHF exacerbation and community acquired pneumonia.  Rocephin and azithromycin. IV Lasix. Panculture. Urine strep and Legionella. 2-D echo. Strict ins and outs and daily weights. Faint wheezing on exam. No prior hx/o pulm disease/smoking. Solumedrol x1 and reassess. ? duonebs if necessary. Step down bed.   CAD/Aortic valve replacement: No CP currenty. Coumadin per pharmacy. Continue outpt regimen. Consider d/cing bblocker if wheezing persist. Tele bed.   DM: SSI. A1C  FEN/GI: low sodium heart healthy/carb modified diet. PPI.   Prophylaxis: coumadin  Disposition: pending further evaluation.  Code Status:Full Code    Patient Active Problem List   Diagnosis Date Noted  . Dyspnea 10/29/2013  . Acute respiratory failure with hypoxia 10/29/2013  . Partial tear of left rotator cuff 10/18/2013  . Rotator cuff syndrome of left shoulder 05/28/2013  . Chronic diastolic heart failure 69/67/8938  . Anemia, iron deficiency 11/05/2012  . Dysphagia, pharyngoesophageal phase 10/22/2012  . Early satiety 10/22/2012  . Dizziness 10/21/2012  . Coronary atherosclerosis of native coronary artery   . Type II diabetes mellitus   . Near syncope 02/07/2012  . Cough 02/07/2012  . H/O prosthetic aortic valve replacement, St Jude 2000 09/21/2011  . Dyslipidemia 09/21/2011  . CAD, CABG 2000, low risk Myoview April 2011 09/21/2011  . Diastolic dysfunction with NL LVF 2D August 2012 09/21/2011  . Claudication 09/21/2011  . PVD, Rt SFA PTA/Stent 09/20/11 09/21/2011  . Chronic anticoagulation 09/21/2011  . Nodule of neck 07/01/2011  .  Anticoagulation management encounter 04/26/2011  . Gross hematuria 05/08/2010  . RIB PAIN, RIGHT SIDED  02/05/2010  . MIGRAINE, OPHTHALMIC 12/16/2009  . DEPRESSION, MAJOR, RECURRENT, MODERATE 12/15/2009  . PERIPHERAL NEUROPATHY 12/15/2009  . OSTEOARTHRITIS, GENERALIZED, MULTIPLE JOINTS 12/30/2008  . DM, Type 2, NIDDM 07/01/2008  . Chronic angle-closure glaucoma 07/01/2008  . LABYRINTHITIS 07/01/2008  . AORTIC VALVE DISEASE 07/01/2008  . DISPLCMT LUMBAR INTERVERT [facility] W/O MYELOPATHY 07/01/2008  . Calcaneal spur 07/01/2008  . OSTEOPOROSIS 07/01/2008   Past Medical History: Past Medical History  Diagnosis Date  . Coronary atherosclerosis of native coronary artery   . Hyperlipidemia   . Chronic angle-closure glaucoma(365.23)   . Aortic valve disorders   . Peripheral vascular disease   . Type II diabetes mellitus   . Depression   . PONV (postoperative nausea and vomiting)     N&V  . CHF (congestive heart failure)     diastolic heart failure  . GERD (gastroesophageal reflux disease)   . Neuromuscular disorder     neuropathy  . Arthritis     osteo  . Anemia   . Syncope 03/29/2012    carotid doppler - R ICA 50-69% reduction by velocities (low end); L ICA 0-49% reduction by velocities (low end); R and L subclavian arteries - <50% redcution; R and L vertebral arteries show normal antegrade flow  . Dizziness 02/09/2012    Holter monitor - sinus rhythm, no ventricular ectopic beats, no sulpraventricular ectopic beats noted  . OSA (obstructive sleep apnea)     Past Surgical History: Past Surgical History  Procedure Laterality Date  . Carpal tunnel release  1989    bilaterally  . Cholecystectomy  1990  . Peripheral arterial stent graft  09/20/11    right SFA  . Cardiac valve replacement  2000    aortic valve   . Laceration repair      right hand  . Neuroplasty / transposition ulnar nerve at elbow      right  . Tonsillectomy  1968  . Vaginal hysterectomy  1985    Fibroids  . Bilateral oophorectomy  1987  . Cesarean section  1969; 1971  . Breast biopsy  2012    left  . Lymph  node biopsy  06/2011    "core needle on 5"  . Cataract extraction w/ intraocular lens  implant, bilateral  ~ 2007  . Refractive surgery  ` 2004    "for glaucoma"  . Needle biopsy  2009    "on ankles for nerve damage"  . Esophagogastroduodenoscopy N/A 10/27/2012    Procedure: ESOPHAGOGASTRODUODENOSCOPY (EGD);  Surgeon: Beryle Beams, MD;  Location: [facility];  Service: Endoscopy;  Laterality: N/A;  . Colonoscopy N/A 10/27/2012    Procedure: COLONOSCOPY;  Surgeon: Beryle Beams, MD;  Location: [facility];  Service: Endoscopy;  Laterality: N/A;  . Doppler echocardiography  03/29/2012    EF >55%; mild concentric LVH; stage 1 diastolic dysfunction, elevated LV filling pressure, dilated LA; MAC mild MR; St Jude AVR peak and mean gradients of 64mmHg and 18mmHg; transvalvular gradients have increased (prev 23 and 14 respectively)  . Cardiovascular stress test  08/25/2011    R/L MV - EF 72%; no scintigraphic evidence of inducible MI; normal perfusionTID of 1.25 elevated - could indicate small vessle subendocardial ischemia; EKG NSR at 66, non diagnostic for ischemia  . Cardiac catheterization  01/14/1999    normal LV function, severe aortic stenosis; 80% and 70% stenosis in RCA; mild 20% distal norrowing in L  main with 20% proximal LAD stenosis, 40% diagonal stenosis and 20% proximal circumflex stenosis  . Femoral artery stent  09/20/2011    6 x 40 Smart Nitinol self-expanding stent placed;  10/15/2031 -R SFA stent open and patent w/o evidence of restenosis  . Coronary artery bypass graft  2000    RIMA to RCA    Social History: History   Social History  . Marital Status: Married    Spouse Name: N/A    Number of Children: N/A  . Years of Education: N/A   Social History Main Topics  . Smoking status: Never Smoker   . Smokeless tobacco: Never Used  . Alcohol Use: No  . Drug Use: No  . Sexual Activity: No   Other Topics Concern  . 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 Married in 1967 she has 1 son born 32 and 1 daughter born 43 and 1 grandchild. 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    Family History: Family History  Problem Relation Age of Onset  . Cancer Mother     Colon  . COPD Mother   . Emphysema Mother   . Diabetes Neg Hx   . Cancer Maternal Grandmother   . Cancer Maternal Grandfather   . Heart disease Brother     Allergies: Allergies  Allergen Reactions  . Atorvastatin     Muscle pain  . Simvastatin Other (See Comments)    Muscle pain  . Sulfa Antibiotics   . Iodinated Diagnostic Agents Rash     Red rash after cardiac cath 1 wk ago, ? Contrast allergy, requires 13 hr prep now per [email or web address]    Current Facility-Administered Medications  Medication Dose Route Frequency Provider Last Rate Last Dose  . azithromycin (ZITHROMAX) 500 mg in dextrose 5 % 250 mL IVPB  500 mg Intravenous Q24H Shanda Howells, MD      . calcium-vitamin D (OSCAL WITH D) 500-200 MG-UNIT per tablet 1 tablet  1 tablet Oral BID Shanda Howells, MD      . ceFEPIme (MAXIPIME) 1 g in dextrose 5 % 50 mL IVPB  1 g Intravenous Once Fredia Sorrow, MD 100 mL/hr at 10/29/13 2038 1 g at 10/29/13 2038  . cefTRIAXone (ROCEPHIN) 1 g in dextrose 5 % 50 mL IVPB  1 g Intravenous Q24H Shanda Howells, MD      . ferrous sulfate tablet 325 mg  325 mg Oral Daily Shanda Howells, MD      . folic acid (FOLVITE) tablet 1 mg  1 mg Oral Daily Shanda Howells, MD      . Derrill Memo ON 10/30/2013] furosemide (LASIX) injection 40 mg  40 mg Intravenous Q12H Shanda Howells, MD      . gabapentin (NEURONTIN) capsule 300 mg  300 mg Oral QHS Shanda Howells, MD      . HYDROcodone-homatropine [facility]) 5-1.5 MG/5ML syrup 5 mL  5 mL Oral Q6H PRN Shanda Howells, MD      . latanoprost (XALATAN) 0.005 % ophthalmic solution 1 drop  1 drop Both Eyes QHS Shanda Howells, MD      . metoprolol succinate (TOPROL-XL) 24 hr tablet 50 mg  50 mg Oral BID  Shanda Howells, MD      . Derrill Memo ON 10/30/2013] pantoprazole (PROTONIX) EC tablet 40 mg  40 mg Oral q12n4p Shanda Howells, MD      . potassium chloride (K-DUR) CR tablet  10-20 mEq  10-20 mEq Oral BID Shanda Howells, MD      . simvastatin (ZOCOR) tablet 40 mg  40 mg Oral Daily Shanda Howells, MD      . traZODone (DESYREL) tablet 25-50 mg  25-50 mg Oral QHS PRN Shanda Howells, MD       Current Outpatient Prescriptions  Medication Sig Dispense Refill  . amoxicillin (AMOXIL) 500 MG capsule Take 1 capsule (500 mg total) by mouth 3 (three) times daily.  30 capsule  0  . Ascorbic Acid (VITAMIN C) 500 MG tablet Take 500 mg by mouth 2 (two) times daily.       . calcium-vitamin D (CALCIUM 500+D) 500-200 MG-UNIT per tablet Take 1 tablet by mouth 2 (two) times daily.       . ferrous sulfate 325 (65 FE) MG tablet Take 1 tablet (325 mg total) by mouth daily.  30 tablet  0  . FEXOFENADINE HCL PO Take 1 tablet by mouth every 12 (twelve) hours.      . folic acid (FOLVITE) 1 MG tablet Take 1 tablet (1 mg total) by mouth daily.  90 tablet  0  . furosemide (LASIX) 40 MG tablet Take 1 tablet (40 mg total) by mouth 2 (two) times daily.  60 tablet  1  . gabapentin (NEURONTIN) 300 MG capsule Take 1 capsule (300 mg total) by mouth at bedtime.  90 capsule  3  . GuaiFENesin (MUCINEX PO) Take 1 tablet by mouth daily.      Marland Kitchen HYDROcodone-homatropine (HYDROMET) 5-1.5 MG/5ML syrup Take 5 mLs by mouth every 6 (six) hours as needed for cough.  120 mL  0  . latanoprost (XALATAN) 0.005 % ophthalmic solution Place 1 drop into both eyes at bedtime.      . metFORMIN (GLUCOPHAGE) 500 MG tablet Take 1,000 mg by mouth 2 (two) times daily with a meal.      . metoprolol succinate (TOPROL-XL) 50 MG 24 hr tablet Take 1 tablet (50 mg total) by mouth 2 (two) times daily.  60 tablet  6  . pantoprazole (PROTONIX) 40 MG tablet Take 40 mg by mouth 2 (two) times daily as needed.      . potassium chloride (K-DUR) 10 MEQ tablet Take 10-20 mEq by mouth 2  (two) times daily. 2 tablets in the morning and 1 tablet at night      . simvastatin (ZOCOR) 40 MG tablet Take 40 mg by mouth daily.      . traZODone (DESYREL) 50 MG tablet Take 0.5-1 tablets (25-50 mg total) by mouth at bedtime as needed for sleep.  30 tablet  3  . warfarin (COUMADIN) 3 MG tablet Take 3 mg by mouth daily. Or as directed       Review Of Systems: 12 point ROS negative except as noted above in HPI.  Physical Exam: Filed Vitals:   10/29/13 2029  BP:   Pulse: 64  Temp:   Resp: 15    General: alert and cooperative HEENT: PERRLA and extra ocular movement intact Heart: S1, S2 normal, no murmur, rub or gallop, regular rate and rhythm Lungs: faint wheezing Abdomen: abdomen is soft without significant tenderness, masses, organomegaly or guarding Extremities: 2+ peripheral pulses, trace edema bilaterally  Skin:no rashes, no ecchymoses Neurology: normal without focal findings  Labs and Imaging: Lab Results  Component Value Date/Time   NA 142 10/29/2013  1:50 PM   NA 138 12/21/2012 10:43 AM   K 4.5 10/29/2013  1:50 PM   K  3.9 12/21/2012 10:43 AM   CL 106 10/29/2013  1:50 PM   CO2 20 10/29/2013  1:50 PM   CO2 28 12/21/2012 10:43 AM   BUN 7 10/29/2013  1:50 PM   BUN 28.5* 12/21/2012 10:43 AM   CREATININE 0.72 10/29/2013  1:50 PM   CREATININE 1.1 12/21/2012 10:43 AM   GLUCOSE 95 10/29/2013  1:50 PM   GLUCOSE 313* 12/21/2012 10:43 AM   Lab Results  Component Value Date   WBC 7.3 10/29/2013   HGB 9.9* 10/29/2013   HCT 31.2* 10/29/2013   MCV 87.6 10/29/2013   PLT 198 10/29/2013    Dg Chest 2 View  10/29/2013   CLINICAL DATA:  Shortness of breath.  EXAM: CHEST  2 VIEW  COMPARISON:  Plain film of the chest and ribs 10/28/2013. PA and lateral chest 10/23/2012. CT chest 10/13/2011.  FINDINGS: There are small bilateral pleural effusions. No consolidative process is identified. There is cardiomegaly and mild interstitial edema. The patient is status post aortic valve replacement. No pneumothorax is  identified.  IMPRESSION: Interstitial edema with associated small bilateral pleural effusions.   Electronically Signed   By: Inge Rise M.D.   On: 10/29/2013 17:16   Dg Ribs Unilateral W/chest Left  10/28/2013   CLINICAL DATA:  Cough.  Shortness of breath.  Left rib pain.  EXAM: LEFT RIBS AND CHEST - 3+ VIEW  COMPARISON:  10/23/2012  FINDINGS: No fracture or other bone lesions are seen involving the ribs. There is no evidence of pneumothorax or pleural effusion.  Pulmonary vascular congestion and diffuse interstitial infiltrates are stable since previous study. No evidence of pulmonary consolidation or pleural effusion. Cardiomegaly is also stable. Patient has undergone previous median sternotomy can't aortic valve replacement.  IMPRESSION: No left rib fracture or other acute findings identified.  Stable cardiomegaly, pulmonary vascular congestion, and probable mild interstitial edema.   Electronically Signed   By: Earle Gell M.D.   On: 10/28/2013 14:25   Ct Chest Wo Contrast  10/29/2013   CLINICAL DATA:  Shortness of breath.  Nonsmoker.  Diabetic.  EXAM: CT CHEST WITHOUT CONTRAST  TECHNIQUE: Multidetector CT imaging of the chest was performed following the standard protocol without IV contrast.  COMPARISON:  Multiple prior chest x-rays most recent 10/29/2013. Prior chest CT 10/13/2011.  FINDINGS: Small bilateral pleural effusions greater on the right. Mild congestive heart suspected.  Right lower lobe consolidation may represent result of an infectious process. This will need to be followup until complete clearance to exclude underlying mass (see series 6, image 50).  Scattered bilateral pulmonary nodules. Left apical 3 and 9 mm nodule are unchanged from the 2013 CT (series 3 image a). There are new or enlarging nodules throughout lung. For instance, new 5.5 mm nodule right upper lobe (series 3, image 11 and series 6, image 35). Enlarging pleural-based nodule right middle lobe now measures 6 x 11 x 7 mm  versus prior 3 x 6 x 5 mm (series 3, image 30 and 80 series 6, image 34). It is possible pulmonary nodules are related to inflammatory process such as MAI however, malignancy cannot be excluded.  Slightly prominent size mediastinal lymph nodes, largest lower pretracheal and subcarinal region.  Recommend treating any clinically suspected infectious infiltrate and/or pulmonary edema with follow-up chest CT within the next 3 months for further delineation of these findings.  Post aortic valve replacement. Mildly dilated ascending thoracic aorta measuring up to 3.7 cm minimally changed from the prior exam.  Prominent coronary  artery calcifications.  Small sclerotic focus posterior aspect T2 vertebral body unchanged.  Limited imaging of upper abdomen reveals minimally nodular contour or of the anterior aspect of the left lobe of the liver without other findings to suggest cirrhosis.  IMPRESSION: Small bilateral pleural effusions greater on the right. Mild congestive heart suspected.  Right lower lobe consolidation may represent result of an infectious process. This will need to be followup until complete clearance to exclude underlying mass.  Scattered bilateral pulmonary nodules. Some are stable whereas others are new or enlarged as detailed above.  Recommend treating any clinically suspected infectious infiltrate and/or pulmonary edema with follow-up chest CT within the next 3 months for further delineation of these findings.  Post aortic valve replacement. Mildly dilated ascending thoracic aorta measuring up to 3.7 cm minimally changed from the prior exam.  Prominent coronary artery calcifications.  Limited imaging of upper abdomen reveals minimally nodular contour or of the anterior aspect of the left lobe of the liver without other findings to suggest cirrhosis.   Electronically Signed   By: Chauncey Cruel M.D.   On: 10/29/2013 18:52           Shanda Howells MD  Pager: [phone]

## 2013-10-29 NOTE — ED Notes (Signed)
Patient transported to X-ray 

## 2013-10-29 NOTE — Progress Notes (Signed)
Subjective:    Patient ID: Abigail Oliver, female    DOB: 25-Oct-1945, 68 y.o.   MRN: [id number]  HPI Comments: New to me she complains of worsening dyspnea. She was seen in the ER yesterday and her CXR and EKG were abnormal. She was told to see cardiology today but didn't. She came here instead.     Review of Systems  Constitutional: Positive for diaphoresis and fatigue. Negative for fever, chills, appetite change and unexpected weight change.  HENT: Negative.   Eyes: Negative.   Respiratory: Positive for cough and shortness of breath. Negative for apnea, choking, chest tightness, wheezing and stridor.   Cardiovascular: Negative for chest pain, palpitations and leg swelling.  Gastrointestinal: Negative.  Negative for abdominal pain.  Endocrine: Negative.   Genitourinary: Negative.   Musculoskeletal: Negative.   Skin: Negative.   Allergic/Immunologic: Negative.   Neurological: Positive for syncope and weakness. Negative for tremors.  Hematological: Negative.  Negative for adenopathy. Does not bruise/bleed easily.  Psychiatric/Behavioral: Negative.        Objective:   Physical Exam  Vitals reviewed. Constitutional: She is oriented to person, place, and time. She appears well-developed and well-nourished.  Non-toxic appearance. She has a sickly appearance. She appears ill. She appears distressed.  HENT:  Head: Normocephalic and atraumatic.  Mouth/Throat: Oropharynx is clear and moist. No oropharyngeal exudate.  Eyes: Conjunctivae are normal. Right eye exhibits no discharge. Left eye exhibits no discharge. No scleral icterus.  Neck: Normal range of motion. Neck supple. No JVD present. No tracheal deviation present. No thyromegaly present.  Cardiovascular: Normal rate, regular rhythm and S1 normal.  Exam reveals no S3, no S4 and no friction rub.   Murmur heard.  Decrescendo systolic murmur is present with a grade of 1/6  Pulses:      Carotid pulses are 1+ on the right side,  and 1+ on the left side.      Radial pulses are 1+ on the right side, and 1+ on the left side.       Femoral pulses are 1+ on the right side, and 1+ on the left side.      Popliteal pulses are 1+ on the right side, and 1+ on the left side.       Dorsalis pedis pulses are 1+ on the right side, and 1+ on the left side.       Posterior tibial pulses are 1+ on the right side, and 1+ on the left side.  Loud S2  Pulmonary/Chest: Breath sounds normal. Accessory muscle usage present. No stridor. Tachypnea noted. She is in respiratory distress. She has no decreased breath sounds. She has no wheezes. She has no rhonchi. She has no rales. She exhibits no tenderness.  Abdominal: Soft. Bowel sounds are normal. She exhibits no distension and no mass. There is no tenderness. There is no rebound and no guarding.  Musculoskeletal: Normal range of motion. She exhibits no edema and no tenderness.  Lymphadenopathy:    She has no cervical adenopathy.  Neurological: She is oriented to person, place, and time.  Skin: Skin is warm and dry. No rash noted. She is not diaphoretic. No erythema. No pallor.     Lab Results  Component Value Date   WBC 6.2 10/28/2013   HGB 8.9* 10/28/2013   HCT 29.1* 10/28/2013   PLT 195 10/28/2013   GLUCOSE 109* 10/28/2013   CHOL 136 03/05/2013   TRIG 161.0* 03/05/2013   HDL 42.50 03/05/2013   LDLDIRECT 141.0 01/01/2013  LDLCALC 61 03/05/2013   ALT 31 03/05/2013   AST 40* 03/05/2013   NA 143 10/28/2013   K 4.1 10/28/2013   CL 106 10/28/2013   CREATININE 0.69 10/28/2013   BUN 9 10/28/2013   CO2 24 10/28/2013   TSH 2.19 04/09/2013   INR 3.9 10/24/2013   HGBA1C 6.4 10/21/2012       Assessment & Plan:

## 2013-10-29 NOTE — Progress Notes (Signed)
Pre visit review using our clinic review tool, if applicable. No additional management support is needed unless otherwise documented below in the visit note. 

## 2013-10-29 NOTE — Assessment & Plan Note (Signed)
She is hypoxic today - I have asked her to return to the ER to be placed on O2 and to be re-evaluated for cardiac causes of dyspnea

## 2013-10-29 NOTE — Progress Notes (Addendum)
ANTIBIOTIC CONSULT NOTE - INITIAL  Pharmacy Consult for vancomycin Indication: pneumonia  Allergies  Allergen Reactions  . Atorvastatin     Muscle pain  . Simvastatin Other (See Comments)    Muscle pain  . Sulfa Antibiotics   . Iodinated Diagnostic Agents Rash     Red rash after cardiac cath 1 wk ago, ? Contrast allergy, requires 13 hr prep now per [email or web address]//[email or web address]    Patient Measurements: Height: 5\' 2"  (157.5 cm) Weight: 188 lb 12.8 oz (85.639 kg) IBW/kg (Calculated) : 50.1  Vital Signs: Temp: 98.6 F (37 C) (06/08 1711) Temp src: Oral (06/08 1552) BP: 135/40 mmHg (06/08 1930) Pulse Rate: 63 (06/08 1930) Intake/Output from previous day:   Intake/Output from this shift:    Labs:  Recent Labs  10/28/13 1425 10/29/13 1350  WBC 6.2 7.3  HGB 8.9* 9.9*  PLT 195 198  CREATININE 0.69 0.72   Estimated Creatinine Clearance: 68.3 ml/min (by C-G formula based on Cr of 0.72). No results found for this basename: VANCOTROUGH, VANCOPEAK, VANCORANDOM, GENTTROUGH, GENTPEAK, GENTRANDOM, TOBRATROUGH, TOBRAPEAK, TOBRARND, AMIKACINPEAK, AMIKACINTROU, AMIKACIN,  in the last 72 hours   Microbiology: No results found for this or any previous visit (from the past 720 hour(s)).  Medical History: Past Medical History  Diagnosis Date  . Coronary atherosclerosis of native coronary artery   . Hyperlipidemia   . Chronic angle-closure glaucoma(365.23)   . Aortic valve disorders   . Peripheral vascular disease   . Type II diabetes mellitus   . Depression   . PONV (postoperative nausea and vomiting)     N&V  . CHF (congestive heart failure)     diastolic heart failure  . GERD (gastroesophageal reflux disease)   . Neuromuscular disorder     neuropathy  . Arthritis     osteo  . Anemia   . Syncope 03/29/2012    carotid doppler - R ICA 50-69% reduction by velocities (low end); L ICA 0-49% reduction by velocities (low end); R and L subclavian arteries - <50% redcution; R and L  vertebral arteries show normal antegrade flow  . Dizziness 02/09/2012    Holter monitor - sinus rhythm, no ventricular ectopic beats, no sulpraventricular ectopic beats noted  . OSA (obstructive sleep apnea)     Medications:  See PTA medication list  Assessment: 68 y/o female who was seen in the ED yesterday and told she had fluid on her lung. She presents today with worsening SOB. Pharmacy consulted to begin vancomycin for PNA. She also has an order for cefepime 1g IV x1  dose. CT chest with possible infiltrate. Renal function and WBC are normal, she is febrile.  Goal of Therapy:  Vancomycin trough level 15-20 mcg/ml  Plan:  - Vancomycin 1000 mg IV q12h - Follow-up continuation of gram negative coverage - Monitor renal function, clinical course, and [employer], Pharm.D., BCPS Clinical Pharmacist Pager: [phone] 10/29/2013 7:57 PM    Addendum: Also consulted to manage Coumadin for St Jude AVR. INR is above goal at 3.73 today. No bleeding noted, Hb is low at 9.9, platelets are 198 and normal.  Goal INR 2.5 - 3.5 per anticoag clinic note from 10/24/2013. Home regimen is 3 mg daily - last dose 6/7  Plan: - Hold Coumadin dose tonight - INR daily  [facility], Pharm.D., BCPS Clinical Pharmacist Pager: [phone] 10/29/2013 9:13 PM

## 2013-10-29 NOTE — ED Provider Notes (Signed)
CSN: [id number]     Arrival date & time 10/29/13  1520 History   First MD Initiated Contact with Patient 10/29/13 1552     Chief Complaint  Patient presents with  . Shortness of Breath     (Consider location/radiation/quality/duration/timing/severity/associated sxs/prior Treatment) Patient is a 68 y.o. female presenting with shortness of breath. The history is provided by the patient and the spouse.  Shortness of Breath Associated symptoms: cough   Associated symptoms: no abdominal pain, no chest pain, no fever, no headaches, no rash and no vomiting    the patient has had a productive cough and shortness of breath since May 25. Patient was seen in the emergency department yesterday with chest x-ray without significant findings determined to possibly be bronchitis. Patient's primary care Dr. and treating it as a bronchitis. Patient states no shortness of breath which is getting worse and is not quite right she does have a history of CHF. Patient is on Lasix for that. Patient denies any chest pain. Patient is concerned because the shortness of breath is not getting better and seems to be getting worse. Patient's primary care Dr. is also concerned about EKG changes on June 7 compared to may. Patient was admitted to the hospital in May for unrelated problems.  Past Medical History  Diagnosis Date  . Coronary atherosclerosis of native coronary artery   . Hyperlipidemia   . Chronic angle-closure glaucoma(365.23)   . Aortic valve disorders   . Peripheral vascular disease   . Type II diabetes mellitus   . Depression   . PONV (postoperative nausea and vomiting)     N&V  . CHF (congestive heart failure)     diastolic heart failure  . GERD (gastroesophageal reflux disease)   . Neuromuscular disorder     neuropathy  . Arthritis     osteo  . Anemia   . Syncope 03/29/2012    carotid doppler - R ICA 50-69% reduction by velocities (low end); L ICA 0-49% reduction by velocities (low end); R and L  subclavian arteries - <50% redcution; R and L vertebral arteries show normal antegrade flow  . Dizziness 02/09/2012    Holter monitor - sinus rhythm, no ventricular ectopic beats, no sulpraventricular ectopic beats noted  . OSA (obstructive sleep apnea)    Past Surgical History  Procedure Laterality Date  . Carpal tunnel release  1989    bilaterally  . Cholecystectomy  1990  . Peripheral arterial stent graft  09/20/11    right SFA  . Cardiac valve replacement  2000    aortic valve   . Laceration repair      right hand  . Neuroplasty / transposition ulnar nerve at elbow      right  . Tonsillectomy  1968  . Vaginal hysterectomy  1985    Fibroids  . Bilateral oophorectomy  1987  . Cesarean section  1969; 1971  . Breast biopsy  2012    left  . Lymph node biopsy  06/2011    "core needle on 5"  . Cataract extraction w/ intraocular lens  implant, bilateral  ~ 2007  . Refractive surgery  ` 2004    "for glaucoma"  . Needle biopsy  2009    "on ankles for nerve damage"  . Esophagogastroduodenoscopy N/A 10/27/2012    Procedure: ESOPHAGOGASTRODUODENOSCOPY (EGD);  Surgeon: Beryle Beams, MD;  Location: [facility];  Service: Endoscopy;  Laterality: N/A;  . Colonoscopy N/A 10/27/2012    Procedure: COLONOSCOPY;  Surgeon: Saralyn Pilar  Renee Ramus, MD;  Location: [facility];  Service: Endoscopy;  Laterality: N/A;  . Doppler echocardiography  03/29/2012    EF >55%; mild concentric LVH; stage 1 diastolic dysfunction, elevated LV filling pressure, dilated LA; MAC mild MR; St Jude AVR peak and mean gradients of 63mmHg and 14mmHg; transvalvular gradients have increased (prev 23 and 14 respectively)  . Cardiovascular stress test  08/25/2011    R/L MV - EF 72%; no scintigraphic evidence of inducible MI; normal perfusionTID of 1.25 elevated - could indicate small vessle subendocardial ischemia; EKG NSR at 66, non diagnostic for ischemia  . Cardiac catheterization  01/14/1999    normal LV function, severe aortic  stenosis; 80% and 70% stenosis in RCA; mild 20% distal norrowing in L main with 20% proximal LAD stenosis, 40% diagonal stenosis and 20% proximal circumflex stenosis  . Femoral artery stent  09/20/2011    6 x 40 Smart Nitinol self-expanding stent placed;  10/15/2031 -R SFA stent open and patent w/o evidence of restenosis  . Coronary artery bypass graft  2000    RIMA to RCA   Family History  Problem Relation Age of Onset  . Cancer Mother     Colon  . COPD Mother   . Emphysema Mother   . Diabetes Neg Hx   . Cancer Maternal Grandmother   . Cancer Maternal Grandfather   . Heart disease Brother    History  Substance Use Topics  . Smoking status: Never Smoker   . Smokeless tobacco: Never Used  . Alcohol Use: No   OB History   Grav Para Term Preterm Abortions TAB SAB Ect Mult Living                 Review of Systems  Constitutional: Negative for fever.  HENT: Negative for congestion.   Eyes: Negative for visual disturbance.  Respiratory: Positive for cough and shortness of breath.   Cardiovascular: Negative for chest pain.  Gastrointestinal: Negative for nausea, vomiting and abdominal pain.  Genitourinary: Negative for dysuria.  Musculoskeletal: Negative for myalgias.  Skin: Negative for rash.  Neurological: Negative for headaches.  Hematological: Does not bruise/bleed easily.  Psychiatric/Behavioral: Negative for confusion.      Allergies  Atorvastatin; Simvastatin; Sulfa antibiotics; and Iodinated diagnostic agents  Home Medications   Prior to Admission medications   Medication Sig Start Date End Date Taking? Authorizing Provider  amoxicillin (AMOXIL) 500 MG capsule Take 1 capsule (500 mg total) by mouth 3 (three) times daily. 10/22/13  Yes Hendricks Limes, MD  Ascorbic Acid (VITAMIN C) 500 MG tablet Take 500 mg by mouth 2 (two) times daily.    Yes Historical Provider, MD  calcium-vitamin D (CALCIUM 500+D) 500-200 MG-UNIT per tablet Take 1 tablet by mouth 2 (two) times  daily.    Yes Historical Provider, MD  ferrous sulfate 325 (65 FE) MG tablet Take 1 tablet (325 mg total) by mouth daily. 10/28/13  Yes Shaune Pollack, MD  FEXOFENADINE HCL PO Take 1 tablet by mouth every 12 (twelve) hours.   Yes Historical Provider, MD  folic acid (FOLVITE) 1 MG tablet Take 1 tablet (1 mg total) by mouth daily. 09/05/13  Yes Rowe Clack, MD  furosemide (LASIX) 40 MG tablet Take 1 tablet (40 mg total) by mouth 2 (two) times daily. 10/24/13  Yes Larey Dresser, MD  gabapentin (NEURONTIN) 300 MG capsule Take 1 capsule (300 mg total) by mouth at bedtime. 06/28/13  Yes Neena Rhymes, MD  GuaiFENesin [facility]  PO) Take 1 tablet by mouth daily.   Yes Historical Provider, MD  HYDROcodone-homatropine (HYDROMET) 5-1.5 MG/5ML syrup Take 5 mLs by mouth every 6 (six) hours as needed for cough. 10/22/13  Yes Hendricks Limes, MD  latanoprost (XALATAN) 0.005 % ophthalmic solution Place 1 drop into both eyes at bedtime.   Yes Historical Provider, MD  metFORMIN (GLUCOPHAGE) 500 MG tablet Take 1,000 mg by mouth 2 (two) times daily with a meal.   Yes Historical Provider, MD  metoprolol succinate (TOPROL-XL) 50 MG 24 hr tablet Take 1 tablet (50 mg total) by mouth 2 (two) times daily. 04/09/13  Yes Larey Dresser, MD  pantoprazole (PROTONIX) 40 MG tablet Take 40 mg by mouth 2 (two) times daily as needed. 10/24/12  Yes Neena Rhymes, MD  potassium chloride (K-DUR) 10 MEQ tablet Take 10-20 mEq by mouth 2 (two) times daily. 2 tablets in the morning and 1 tablet at night   Yes Historical Provider, MD  simvastatin (ZOCOR) 40 MG tablet Take 40 mg by mouth daily. 09/08/13  Yes Historical Provider, MD  traZODone (DESYREL) 50 MG tablet Take 0.5-1 tablets (25-50 mg total) by mouth at bedtime as needed for sleep. 09/27/13  Yes Rowe Clack, MD  warfarin (COUMADIN) 3 MG tablet Take 3 mg by mouth daily. Or as directed   Yes Historical Provider, MD   BP 135/40  Pulse 63  Temp(Src) 98.6 F (37 C) (Oral)   Resp 18  SpO2 92% Physical Exam  Nursing note and vitals reviewed. Constitutional: She is oriented to person, place, and time. She appears well-developed and well-nourished. No distress.  HENT:  Head: Normocephalic and atraumatic.  Mouth/Throat: Oropharynx is clear and moist.  Eyes: Conjunctivae and EOM are normal. Pupils are equal, round, and reactive to light.  Neck: Normal range of motion. Neck supple.  Cardiovascular: Normal rate, regular rhythm and normal heart sounds.   Pulmonary/Chest: Effort normal and breath sounds normal. No respiratory distress. She has no wheezes. She has no rales.  Abdominal: Soft. Bowel sounds are normal. There is no tenderness.  Musculoskeletal: Normal range of motion.  Neurological: She is alert and oriented to person, place, and time. No cranial nerve deficit. She exhibits normal muscle tone. Coordination normal.  Skin: Skin is warm. No rash noted.    ED Course  Procedures (including critical care time) Labs Review Labs Reviewed  CBC - Abnormal; Notable for the following:    RBC 3.56 (*)    Hemoglobin 9.9 (*)    HCT 31.2 (*)    RDW 16.1 (*)    All other components within normal limits  BASIC METABOLIC PANEL - Abnormal; Notable for the following:    GFR calc non Af Amer 86 (*)    All other components within normal limits  PRO B NATRIURETIC PEPTIDE - Abnormal; Notable for the following:    Pro B Natriuretic peptide (BNP) 2638.0 (*)    All other components within normal limits  PROTIME-INR - Abnormal; Notable for the following:    Prothrombin Time 35.5 (*)    INR 3.73 (*)    All other components within normal limits  CULTURE, BLOOD (ROUTINE X 2)  CULTURE, BLOOD (ROUTINE X 2)  I-STAT TROPOININ, ED    Imaging Review Dg Chest 2 View  10/29/2013   CLINICAL DATA:  Shortness of breath.  EXAM: CHEST  2 VIEW  COMPARISON:  Plain film of the chest and ribs 10/28/2013. PA and lateral chest 10/23/2012. CT chest 10/13/2011.  FINDINGS: There are small  bilateral pleural effusions. No consolidative process is identified. There is cardiomegaly and mild interstitial edema. The patient is status post aortic valve replacement. No pneumothorax is identified.  IMPRESSION: Interstitial edema with associated small bilateral pleural effusions.   Electronically Signed   By: Inge Rise M.D.   On: 10/29/2013 17:16   Dg Ribs Unilateral W/chest Left  10/28/2013   CLINICAL DATA:  Cough.  Shortness of breath.  Left rib pain.  EXAM: LEFT RIBS AND CHEST - 3+ VIEW  COMPARISON:  10/23/2012  FINDINGS: No fracture or other bone lesions are seen involving the ribs. There is no evidence of pneumothorax or pleural effusion.  Pulmonary vascular congestion and diffuse interstitial infiltrates are stable since previous study. No evidence of pulmonary consolidation or pleural effusion. Cardiomegaly is also stable. Patient has undergone previous median sternotomy can't aortic valve replacement.  IMPRESSION: No left rib fracture or other acute findings identified.  Stable cardiomegaly, pulmonary vascular congestion, and probable mild interstitial edema.   Electronically Signed   By: Earle Gell M.D.   On: 10/28/2013 14:25   Ct Chest Wo Contrast  10/29/2013   CLINICAL DATA:  Shortness of breath.  Nonsmoker.  Diabetic.  EXAM: CT CHEST WITHOUT CONTRAST  TECHNIQUE: Multidetector CT imaging of the chest was performed following the standard protocol without IV contrast.  COMPARISON:  Multiple prior chest x-rays most recent 10/29/2013. Prior chest CT 10/13/2011.  FINDINGS: Small bilateral pleural effusions greater on the right. Mild congestive heart suspected.  Right lower lobe consolidation may represent result of an infectious process. This will need to be followup until complete clearance to exclude underlying mass (see series 6, image 50).  Scattered bilateral pulmonary nodules. Left apical 3 and 9 mm nodule are unchanged from the 2013 CT (series 3 image a). There are new or enlarging  nodules throughout lung. For instance, new 5.5 mm nodule right upper lobe (series 3, image 11 and series 6, image 35). Enlarging pleural-based nodule right middle lobe now measures 6 x 11 x 7 mm versus prior 3 x 6 x 5 mm (series 3, image 30 and 80 series 6, image 34). It is possible pulmonary nodules are related to inflammatory process such as MAI however, malignancy cannot be excluded.  Slightly prominent size mediastinal lymph nodes, largest lower pretracheal and subcarinal region.  Recommend treating any clinically suspected infectious infiltrate and/or pulmonary edema with follow-up chest CT within the next 3 months for further delineation of these findings.  Post aortic valve replacement. Mildly dilated ascending thoracic aorta measuring up to 3.7 cm minimally changed from the prior exam.  Prominent coronary artery calcifications.  Small sclerotic focus posterior aspect T2 vertebral body unchanged.  Limited imaging of upper abdomen reveals minimally nodular contour or of the anterior aspect of the left lobe of the liver without other findings to suggest cirrhosis.  IMPRESSION: Small bilateral pleural effusions greater on the right. Mild congestive heart suspected.  Right lower lobe consolidation may represent result of an infectious process. This will need to be followup until complete clearance to exclude underlying mass.  Scattered bilateral pulmonary nodules. Some are stable whereas others are new or enlarged as detailed above.  Recommend treating any clinically suspected infectious infiltrate and/or pulmonary edema with follow-up chest CT within the next 3 months for further delineation of these findings.  Post aortic valve replacement. Mildly dilated ascending thoracic aorta measuring up to 3.7 cm minimally changed from the prior exam.  Prominent coronary artery calcifications.  Limited imaging of upper abdomen reveals minimally nodular contour or of the anterior aspect of the left lobe of the liver  without other findings to suggest cirrhosis.   Electronically Signed   By: Chauncey Cruel M.D.   On: 10/29/2013 18:52     EKG Interpretation   Date/Time:  Monday October 29 2013 15:39:02 EDT Ventricular Rate:  62 PR Interval:  166 QRS Duration: 72 QT Interval:  468 QTC Calculation: 475 R Axis:   66 Text Interpretation:  Normal sinus rhythm Nonspecific ST abnormality  Abnormal ECG Artifact No significant change was found compared to 10/21/12  Confirmed by Najla Aughenbaugh  MD, [place] [phone]) on 10/29/2013 7:31:38 PM      MDM   Final diagnoses:  HCAP (healthcare-associated pneumonia)  CHF (congestive heart failure)    Patient with a significant cough and shortness of breath since May 25 in treated as a bronchitis. Patient states that her shortness of breath which is getting worse. Regular x-ray today without any significant findings other than the finding of small pleural effusions. CT of the chest was done which determined possible pneumonia this would be a healthcare acquired pneumonia the patient was admitted in May to the hospital. Also shows probable mild CHF. Will treat with Lasix 80 mg for that. Patient also started on H. Pneumonia protocol drugs. Blood cultures done before hand. Patient not hypoxic. Lowest PO2 was 89%. Currently patient's oxygen saturations are 92%. Patient is currently followed by Dr. Cathlean Cower.    Fredia Sorrow, MD 10/29/13 1958

## 2013-10-29 NOTE — Progress Notes (Deleted)
Cardiology Office Note   Date:  10/29/2013   ID:  Abigail, Oliver 11/05/1945, MRN [id number]  PCP:  Vertell Novak, MD  Cardiologist:  Dr. Loralie Champagne   Electrophysiologist:  ***   History of Present Illness: Abigail Oliver is a 68 y.o. female with a hx of aortic stenosis s/p St Jude mechanical AVR (6/00) and CAD s/p single vessel CABG (6/00).  Previously followed by Dr. Claiborne Billings in the past.  She had severe aortic stenosis and therefore required CABG-AVR in 6/00. She had a RIMA-RCA. She had a myoview in 4/13 with no ischemia or infarction, and echo in 6/14 showed normal EF with moderate diastolic dysfunction and a well-seated mechanical valve.  Last seen by Dr. Loralie Champagne in 03/2013.  She was seen in ED 10/28/13 with chest wall pain in the setting of cough/bronchitis. She was placed on amoxicillin and given albuterol when necessary.  Cardiac markers were negative.  Hemoglobin was noted be 8.9. ED notes indicate Hemoccult was negative. She was placed on iron.  Chest x-ray demonstrated pulmonary vascular congestion and probable mild interstitial edema.    ***   Chest and Rib Films (10/28/13): IMPRESSION: No left rib fracture or other acute findings identified.  Stable cardiomegaly, pulmonary vascular congestion, and probable mild interstitial edema.   Studies:  - Echo (6/14) with EF 60-65%, mild LVH, moderate diastolic dysfunction, normal RV, mechanical aortic valve with mean gradient 20 mmHg.   - Myoview in 4/13 with EF 72%, no ischemia or infarction.   - Carotid dopplers (11/13) witih 50-69% RICA   Recent Labs: 01/01/2013: Direct LDL 141.0; Pro B Natriuretic peptide (BNP) 55.0  03/05/2013: ALT 31; HDL Cholesterol by NMR 42.50; LDL (calc) 61  04/09/2013: TSH 2.19  10/28/2013: Creatinine 0.69; Hemoglobin 8.9*; Potassium 4.1   Wt Readings from Last 3 Encounters:  10/28/13 188 lb (85.276 kg)  10/22/13 192 lb (87.091 kg)  10/18/13 186 lb (84.369 kg)     PMH: 1. Aortic  stenosis: Now has St Jude mechanical aortic valve (6/00). Echo (6/14) with EF 60-65%, mild LVH, moderate diastolic dysfunction, normal RV, mechanical aortic valve with mean gradient 20 mmHg.  2. CAD: CABG at time of AVR in 6/00 with RIMA-RCA. Myoview in 4/13 with EF 72%, no ischemia or infarction.  3. HTN 4. Type II diabetes  5. GERD 6. OSA 7. Hyperlipidemia  8. PAD: Right SFA stent 08/2011.  9. Colon polyps  10. Carotid stenosis: Carotid dopplers (11/13) witih 50-69% RICA.  11. Diastolic CHF  12. Gastritis  13. Dizziness: Holter (8/14) with few PACs, otherwise unremarkable.  14. Mild transaminase elevation   Past Medical History  Diagnosis Date  . Coronary atherosclerosis of native coronary artery   . Hyperlipidemia   . Chronic angle-closure glaucoma(365.23)   . Aortic valve disorders   . Peripheral vascular disease   . Type II diabetes mellitus   . Depression   . PONV (postoperative nausea and vomiting)     N&V  . CHF (congestive heart failure)     diastolic heart failure  . GERD (gastroesophageal reflux disease)   . Neuromuscular disorder     neuropathy  . Arthritis     osteo  . Anemia   . Syncope 03/29/2012    carotid doppler - R ICA 50-69% reduction by velocities (low end); L ICA 0-49% reduction by velocities (low end); R and L subclavian arteries - <50% redcution; R and L vertebral arteries show normal antegrade flow  . Dizziness 02/09/2012  Holter monitor - sinus rhythm, no ventricular ectopic beats, no sulpraventricular ectopic beats noted  . OSA (obstructive sleep apnea)     Current Outpatient Prescriptions  Medication Sig Dispense Refill  . amoxicillin (AMOXIL) 500 MG capsule Take 1 capsule (500 mg total) by mouth 3 (three) times daily.  30 capsule  0  . Ascorbic Acid (VITAMIN C) 500 MG tablet Take 500 mg by mouth 2 (two) times daily.       . calcium-vitamin D (CALCIUM 500+D) 500-200 MG-UNIT per tablet Take 1 tablet by mouth 2 (two) times daily.       .  citalopram (CELEXA) 20 MG tablet Take 20 mg by mouth daily.      . ferrous sulfate 325 (65 FE) MG tablet Take 1 tablet (325 mg total) by mouth daily.  30 tablet  0  . FEXOFENADINE HCL PO Take 1 tablet by mouth every 12 (twelve) hours.      . folic acid (FOLVITE) 1 MG tablet Take 1 tablet (1 mg total) by mouth daily.  90 tablet  0  . furosemide (LASIX) 40 MG tablet Take 1 tablet (40 mg total) by mouth 2 (two) times daily.  60 tablet  1  . gabapentin (NEURONTIN) 300 MG capsule Take 1 capsule (300 mg total) by mouth at bedtime.  90 capsule  3  . GuaiFENesin (MUCINEX PO) Take 1 tablet by mouth daily.      Marland Kitchen HYDROcodone-homatropine (HYDROMET) 5-1.5 MG/5ML syrup Take 5 mLs by mouth every 6 (six) hours as needed for cough.  120 mL  0  . latanoprost (XALATAN) 0.005 % ophthalmic solution Place 1 drop into both eyes at bedtime.      . metFORMIN (GLUCOPHAGE) 500 MG tablet Take 1,000 mg by mouth 2 (two) times daily with a meal.      . metoprolol succinate (TOPROL-XL) 50 MG 24 hr tablet Take 1 tablet (50 mg total) by mouth 2 (two) times daily.  60 tablet  6  . pantoprazole (PROTONIX) 40 MG tablet Take 40 mg by mouth 2 (two) times daily as needed.      . potassium chloride (K-DUR) 10 MEQ tablet Take 10-20 mEq by mouth 2 (two) times daily. 2 tablets in the morning and 1 tablet at night      . potassium chloride (K-DUR,KLOR-CON) 10 MEQ tablet TAKE 2 TABLETS BY MOUTH EVERY MORNING AND 1 TABLET IN THE EVENING.  90 tablet  5  . simvastatin (ZOCOR) 40 MG tablet Take 40 mg by mouth daily.      . traMADol (ULTRAM) 50 MG tablet Take 1 tablet (50 mg total) by mouth every 8 (eight) hours as needed for pain.  100 tablet  1  . traZODone (DESYREL) 50 MG tablet Take 0.5-1 tablets (25-50 mg total) by mouth at bedtime as needed for sleep.  30 tablet  3  . warfarin (COUMADIN) 3 MG tablet Take 3 mg by mouth daily. Or as directed       No current facility-administered medications for this visit.    Allergies:   Atorvastatin;  Simvastatin; Sulfa antibiotics; and Iodinated diagnostic agents   Social History:  The patient  reports that she has never smoked. She has never used smokeless tobacco. She reports that she does not drink alcohol or use illicit drugs.   Family History:  The patient's family history includes COPD in her mother; Cancer in her maternal grandfather, maternal grandmother, and mother; Emphysema in her mother; Heart disease in her brother. There is  no history of Diabetes.   ROS:  Please see the history of present illness.   ***   All other systems reviewed and negative.   PHYSICAL EXAM: VS:  There were no vitals taken for this visit. Well nourished, well developed, in no acute distress HEENT: normal Neck: ***no JVD Cardiac:  normal S1, S2; ***RRR; no murmur Lungs:  ***clear to auscultation bilaterally, no wheezing, rhonchi or rales Abd: soft, nontender, no hepatomegaly Ext: ***no edema Skin: warm and dry Neuro:  CNs 2-12 intact, no focal abnormalities noted  EKG:  ***     ASSESSMENT AND PLAN:  CAD s/p CABG (RIMA-RCA) 2000 at time of AVR  Aortic Stenosis  s/p prosthetic aortic valve replacement, St Jude 7017  Chronic diastolic heart failure  Dyslipidemia  Anemia  Carotid stenosis  Bronchitis ***   Signed, Versie Starks, MHS 10/29/2013 9:22 AM    [facility] [place], [place], [place]  [address] Phone: [phone]; Fax: [phone]

## 2013-10-30 DIAGNOSIS — I2510 Atherosclerotic heart disease of native coronary artery without angina pectoris: Secondary | ICD-10-CM

## 2013-10-30 DIAGNOSIS — I359 Nonrheumatic aortic valve disorder, unspecified: Secondary | ICD-10-CM

## 2013-10-30 DIAGNOSIS — E119 Type 2 diabetes mellitus without complications: Secondary | ICD-10-CM

## 2013-10-30 DIAGNOSIS — J189 Pneumonia, unspecified organism: Secondary | ICD-10-CM | POA: Diagnosis not present

## 2013-10-30 DIAGNOSIS — Z7901 Long term (current) use of anticoagulants: Secondary | ICD-10-CM

## 2013-10-30 DIAGNOSIS — R059 Cough, unspecified: Secondary | ICD-10-CM

## 2013-10-30 DIAGNOSIS — I519 Heart disease, unspecified: Secondary | ICD-10-CM

## 2013-10-30 DIAGNOSIS — Z5181 Encounter for therapeutic drug level monitoring: Secondary | ICD-10-CM | POA: Diagnosis not present

## 2013-10-30 DIAGNOSIS — I5032 Chronic diastolic (congestive) heart failure: Secondary | ICD-10-CM

## 2013-10-30 DIAGNOSIS — R05 Cough: Secondary | ICD-10-CM

## 2013-10-30 DIAGNOSIS — J9600 Acute respiratory failure, unspecified whether with hypoxia or hypercapnia: Secondary | ICD-10-CM

## 2013-10-30 DIAGNOSIS — Z954 Presence of other heart-valve replacement: Secondary | ICD-10-CM

## 2013-10-30 DIAGNOSIS — G609 Hereditary and idiopathic neuropathy, unspecified: Secondary | ICD-10-CM

## 2013-10-30 LAB — LEGIONELLA ANTIGEN, URINE: Legionella Antigen, Urine: NEGATIVE

## 2013-10-30 LAB — COMPREHENSIVE METABOLIC PANEL
ALT: 10 U/L (ref 0–35)
AST: 18 U/L (ref 0–37)
Albumin: 3.8 g/dL (ref 3.5–5.2)
Alkaline Phosphatase: 79 U/L (ref 39–117)
BUN: 9 mg/dL (ref 6–23)
CO2: 25 mEq/L (ref 19–32)
Calcium: 9.5 mg/dL (ref 8.4–10.5)
Chloride: 102 mEq/L (ref 96–112)
Creatinine, Ser: 0.72 mg/dL (ref 0.50–1.10)
GFR calc Af Amer: >90 mL/min (ref >90)
GFR calc non Af Amer: 86 mL/min — ABNORMAL LOW (ref >90)
Glucose, Bld: 162 mg/dL — ABNORMAL HIGH (ref 70–99)
Potassium: 4.1 mEq/L (ref 3.7–5.3)
Sodium: 141 mEq/L (ref 137–147)
Total Bilirubin: 1.1 mg/dL (ref 0.3–1.2)
Total Protein: 8 g/dL (ref 6.0–8.3)

## 2013-10-30 LAB — PROTIME-INR
INR: 3.48 — ABNORMAL HIGH (ref 0.00–1.49)
Prothrombin Time: 33.7 seconds — ABNORMAL HIGH (ref 11.6–15.2)

## 2013-10-30 LAB — GLUCOSE, CAPILLARY
Glucose-Capillary: 201 mg/dL — ABNORMAL HIGH (ref 70–99)
Glucose-Capillary: 169 mg/dL — ABNORMAL HIGH (ref 70–99)
Glucose-Capillary: 159 mg/dL — ABNORMAL HIGH (ref 70–99)

## 2013-10-30 LAB — CBC WITH DIFFERENTIAL/PLATELET
Basophils Absolute: 0 10*3/uL (ref 0.0–0.1)
Basophils Relative: 0 % (ref 0–1)
Eosinophils Absolute: 0 10*3/uL (ref 0.0–0.7)
Eosinophils Relative: 0 % (ref 0–5)
HCT: 33 % — ABNORMAL LOW (ref 36.0–46.0)
Hemoglobin: 10.3 g/dL — ABNORMAL LOW (ref 12.0–15.0)
Lymphocytes Relative: 14 % (ref 12–46)
Lymphs Abs: 1 10*3/uL (ref 0.7–4.0)
MCH: 27.2 pg (ref 26.0–34.0)
MCHC: 31.2 g/dL (ref 30.0–36.0)
MCV: 87.3 fL (ref 78.0–100.0)
Monocytes Absolute: 0.1 10*3/uL (ref 0.1–1.0)
Monocytes Relative: 2 % — ABNORMAL LOW (ref 3–12)
Neutro Abs: 6 10*3/uL (ref 1.7–7.7)
Neutrophils Relative %: 84 % — ABNORMAL HIGH (ref 43–77)
Platelets: 213 10*3/uL (ref 150–400)
RBC: 3.78 MIL/uL — ABNORMAL LOW (ref 3.87–5.11)
RDW: 16 % — ABNORMAL HIGH (ref 11.5–15.5)
WBC: 7.1 10*3/uL (ref 4.0–10.5)

## 2013-10-30 LAB — HEMOGLOBIN A1C
Hgb A1c MFr Bld: 5.8 % — ABNORMAL HIGH (ref <5.7)
Mean Plasma Glucose: 120 mg/dL — ABNORMAL HIGH (ref <117)

## 2013-10-30 LAB — MRSA PCR SCREENING: MRSA by PCR: NEGATIVE

## 2013-10-30 LAB — HIV ANTIBODY (ROUTINE TESTING W REFLEX): HIV 1&2 Ab, 4th Generation: NONREACTIVE

## 2013-10-30 MED ORDER — FUROSEMIDE 10 MG/ML IJ SOLN
60.0000 mg | Freq: Two times a day (BID) | INTRAMUSCULAR | Status: DC
  Administered 2013-10-30 – 2013-10-31 (×2): 60 mg via INTRAVENOUS
  Filled 2013-10-30 (×3): qty 6

## 2013-10-30 MED ORDER — WARFARIN SODIUM 2 MG PO TABS
2.0000 mg | ORAL_TABLET | Freq: Once | ORAL | Status: AC
  Administered 2013-10-30: 2 mg via ORAL
  Filled 2013-10-30: qty 1

## 2013-10-30 MED ORDER — METHYLPREDNISOLONE SODIUM SUCC 125 MG IJ SOLR
80.0000 mg | INTRAMUSCULAR | Status: DC
  Administered 2013-10-30 – 2013-10-31 (×2): 80 mg via INTRAVENOUS
  Filled 2013-10-30 (×2): qty 1.28

## 2013-10-30 MED ORDER — DM-GUAIFENESIN ER 30-600 MG PO TB12
1.0000 | ORAL_TABLET | Freq: Two times a day (BID) | ORAL | Status: DC
  Administered 2013-10-30 – 2013-10-31 (×3): 1 via ORAL
  Filled 2013-10-30 (×4): qty 1

## 2013-10-30 MED ORDER — IPRATROPIUM-ALBUTEROL 0.5-2.5 (3) MG/3ML IN SOLN
3.0000 mL | Freq: Four times a day (QID) | RESPIRATORY_TRACT | Status: DC
  Administered 2013-10-30 – 2013-10-31 (×4): 3 mL via RESPIRATORY_TRACT
  Filled 2013-10-30 (×5): qty 3

## 2013-10-30 NOTE — Progress Notes (Signed)
ANTICOAGULATION CONSULT NOTE - Initial Consult  Pharmacy Consult for warfarin Indication: St. Jude AVR  Allergies  Allergen Reactions  . Atorvastatin     Muscle pain  . Simvastatin Other (See Comments)    Muscle pain  . Sulfa Antibiotics   . Iodinated Diagnostic Agents Rash     Red rash after cardiac cath 1 wk ago, ? Contrast allergy, requires 13 hr prep now per [email or web address]    Patient Measurements: Height: 5\' 2"  (157.5 cm) Weight: 187 lb 2.7 oz (84.9 kg) IBW/kg (Calculated) : 50.1 Heparin Dosing Weight:   Vital Signs: Temp: 98.6 F (37 C) (06/09 1208) Temp src: Oral (06/09 1208) BP: 131/48 mmHg (06/09 1208) Pulse Rate: 71 (06/09 1208)  Labs:  Recent Labs  10/28/13 1425 10/29/13 1350 10/30/13 0230 10/30/13 1213  HGB 8.9* 9.9* 10.3*  --   HCT 29.1* 31.2* 33.0*  --   PLT 195 198 213  --   LABPROT  --  35.5*  --  33.7*  INR  --  3.73*  --  3.48*  CREATININE 0.69 0.72 0.72  --   TROPONINI <0.30  --   --   --     Estimated Creatinine Clearance: 68 ml/min (by C-G formula based on Cr of 0.72).   Medical History: Past Medical History  Diagnosis Date  . Coronary atherosclerosis of native coronary artery   . Hyperlipidemia   . Chronic angle-closure glaucoma(365.23)   . Aortic valve disorders   . Peripheral vascular disease   . Type II diabetes mellitus   . Depression   . PONV (postoperative nausea and vomiting)     N&V  . CHF (congestive heart failure)     diastolic heart failure  . GERD (gastroesophageal reflux disease)   . Neuromuscular disorder     neuropathy  . Arthritis     osteo  . Anemia   . Syncope 03/29/2012    carotid doppler - R ICA 50-69% reduction by velocities (low end); L ICA 0-49% reduction by velocities (low end); R and L subclavian arteries - <50% redcution; R and L vertebral arteries show normal antegrade flow  . Dizziness 02/09/2012    Holter monitor - sinus rhythm, no ventricular ectopic beats, no sulpraventricular ectopic  beats noted  . OSA (obstructive sleep apnea)     Medications:  See EMR  Assessment: 68 year old female on chronic anticoagulation for St. Jude AVR.  PTA warfarin dose: 3 mg po daily.  Last dose of warfarin was 6/7.  Warfarin was held yesterday due to supratherapeutic INR.  INR today is at goal at 3.48.  Expect to see further decline tomorrow.   Goal of Therapy:  INR 2.5-3.5 Monitor platelets by anticoagulation protocol: Yes   Plan:  -Warfarin 2 mg po x1 -Daily INR -Monitor for s/sx bleeding   Hughes Better, PharmD, BCPS Clinical Pharmacist Pager: [phone] 10/30/2013 1:28 PM

## 2013-10-30 NOTE — Progress Notes (Signed)
  Echocardiogram 2D Echocardiogram has been performed.  Abigail Oliver 10/30/2013, 2:46 PM

## 2013-10-30 NOTE — Progress Notes (Signed)
UR completed. Patient changed to inpatient- requiring IV antibiotics and IV lasix.  

## 2013-10-30 NOTE — Progress Notes (Signed)
[place] TEAM 1 - Stepdown/ICU TEAM Progress Note  Abigail Oliver DXI:[id number] DOB: 06-08-1945 DOA: 10/29/2013 PCP: Vertell Novak, MD  Admit HPI / Brief Narrative: 68 y.o PMHx depression CAD S/P CABG in 2000, chronic grade 2 diastolic dysfunction, type 2 diabetes with peripheral neuropathy, aortic valve disease status post mechanical aortic valve (St. Jude) replacement 2000, PVD S./P SFA  PTA/stent 09/20/2011 on Coumadin, HLD, HTN   Presenting with acute respiratory failure with hypoxia. Patient states that she developed a mild respiratory infection about 3-4 weeks ago with symptoms including cough and rhinorrhea. Was seen by PCP 2-3 weeks ago with symptoms. Was placed on amoxicillin at second visit. Patient states the cough persists despite treatment. Patient was also switched from torsemide to Lasix because of cost issues about one week ago. Patient states that since which she's had progressive lower extremity swelling and worsening orthopnea. Her dry weight went from 180s to the 190s. Was put back on twice a day Lasix about 2 days ago. Patient denies taking NSAIDs or eating salty foods. Was seen in ER yesterday for her dyspnea. Chest x-ray was negative the patient was subsequently discharged home. Follow up in the PCPs office today with worsening dyspnea and was directed back to the ER.  On presentation, MAXIMUM TEMPERATURE 100.2. Heart rate in the 40s to 60s. Pressure in the 120s to 170s. Satting in the upper 80s on room air. White blood cell count 7.3. Hemoglobin 9.9. Creatinine 0.72. INR 3.73. ProBNP 2600. Initial chest x-ray showed interstitial edema. Followup chest CT show bilateral pleural effusions with mild CHF as well as right lower lobe pneumonia and scattered bilateral pulmonary nodules. Patient start vancomycin cefepime by ER physician. Also given 80 mg of IV Lasix x1.   HPI/Subjective: 6/9 patient states weighs herself every morning and her base weight is 180 pounds, states  currently he started to gain weight over the last week when she started feeling poorly. Does not use home O2, and has never been diagnosed with asthma/COPD.   Assessment/Plan: Acute respiratory failure with hypoxia/CAP:  -Likely multifactorial contributions of CHF exacerbation and community acquired pneumonia. - Rocephin and azithromycin.  -Urine strep and Legionella. Pending -Respiratory virus panel pending -Sputum pending -Mucinex DM BID -DuoNeb QID -Flutter valve q 4hr while awake  -Solu-Medrol 80 mg daily -Ambulatory SpO2 this afternoon  Multiple pulmonary nodules -Will need further evaluation after patient's acute respiratory illness treated.  Chronic diastolic CHF -dry weight 767 admission weight= 5.6 kg (188.7lbs); current weight bed= 84.9 kg (187lbs) --IV Lasix. 60 mg daily -2-D echocardiogram . -Strict ins and outs  -daily a.m. weights.  CAD/Aortic valve replacement:  No CP currenty. Coumadin per pharmacy. Continue outpt regimen.   HTN -Continue metoprolol 50 mg BID  HLD -Obtain lipid panel with a.m. labs   DM/peripheral neuropathy -6/8 Hemoglobin A1c= 5.8 -Sensitive SSI.     Code Status: FULL Family Communication: no family present at time of exam Disposition Plan: Resolution of SOB    Consultants: NA  Procedure/Significant Events: 6/7 DG ribs unilateral with chest left No left rib fracture or other acute findings identified.  Stable cardiomegaly, pulmonary vascular congestion;probable mild interstitial edema. 6/8 CXR;Interstitial edema w/ associated small bilateral pleural effusions. 6/8 CT chest without contrastSmall bilateral pleural effusions>right. RLL consolidation may represent result of an infectious process. This will need to be followup until complete clearance to  exclude underlying mass.  -Scattered bilateral pulmonary nodules. Some Stable/some new or enlarged.  follow-up chest CT w/i next 3 months.  -  Post aortic valve  replacement.    Culture 6/8 MRSA by PCR negative 6/8 blood pending 6/9 respiratory viral pending    Antibiotics: Vancomycin 6/8 x1   Cefepime 6/8 x1 dose Azithromycin 6/8 Ceftriaxone 6/8  DVT prophylaxis: Full dose warfarin   Devices NA    LINES / TUBES:  6/8 20ga right forearm  Continuous Infusions:   Objective: VITAL SIGNS: Temp: 98.1 F (36.7 C) (06/09 0847) Temp src: Oral (06/09 0847) BP: 147/51 mmHg (06/09 0847) Pulse Rate: 61 (06/09 0847) SPO2; 95% on 2.5 L O2 via [place] FIO2:   Intake/Output Summary (Last 24 hours) at 10/30/13 0912 Last data filed at 10/30/13 0818  Gross per 24 hour  Intake    410 ml  Output   1800 ml  Net  -1390 ml     Exam: General: A./O. x4, NAD, No acute respiratory distress Lungs:  Mild bibasilar crackles, mildly decreased breath sounds on RLL. Cardiovascular: Regular rate and rhythm without murmur gallop or rub normal S1 and S2 Abdomen: Nontender, nondistended, soft, bowel sounds positive, no rebound, no ascites, no appreciable mass Extremities: No significant cyanosis, clubbing, or edema bilateral lower extremities  Data Reviewed: Basic Metabolic Panel:  Recent Labs Lab 10/28/13 1425 10/29/13 1350 10/30/13 0230  NA 143 142 141  K 4.1 4.5 4.1  CL 106 106 102  CO2 24 20 25   GLUCOSE 109* 95 162*  BUN 9 7 9   CREATININE 0.69 0.72 0.72  CALCIUM 9.4 9.6 9.5   Liver Function Tests:  Recent Labs Lab 10/30/13 0230  AST 18  ALT 10  ALKPHOS 79  BILITOT 1.1  PROT 8.0  ALBUMIN 3.8   No results found for this basename: LIPASE, AMYLASE,  in the last 168 hours No results found for this basename: AMMONIA,  in the last 168 hours CBC:  Recent Labs Lab 10/28/13 1425 10/29/13 1350 10/30/13 0230  WBC 6.2 7.3 7.1  NEUTROABS 4.0  --  6.0  HGB 8.9* 9.9* 10.3*  HCT 29.1* 31.2* 33.0*  MCV 88.2 87.6 87.3  PLT 195 198 213   Cardiac Enzymes:  Recent Labs Lab 10/28/13 1425  TROPONINI <0.30   BNP (last 3  results)  Recent Labs  11/06/12 1219 01/01/13 0808 10/29/13 1550  PROBNP 216.0* 55.0 2638.0*   CBG:  Recent Labs Lab 10/29/13 2305  GLUCAP 111*    Recent Results (from the past 240 hour(s))  MRSA PCR SCREENING     Status: None   Collection Time    10/29/13 10:36 PM      Result Value Ref Range Status   MRSA by PCR NEGATIVE  NEGATIVE Final   Comment:            The GeneXpert MRSA Assay (FDA     approved for NASAL specimens     only), is one component of a     comprehensive MRSA colonization     surveillance program. It is not     intended to diagnose MRSA     infection nor to guide or     monitor treatment for     MRSA infections.     Studies:  Recent x-ray studies have been reviewed in detail by the Attending Physician  Scheduled Meds:  Scheduled Meds: . azithromycin  500 mg Intravenous Q24H  . calcium-vitamin D  1 tablet Oral BID  . cefTRIAXone (ROCEPHIN)  IV  1 g Intravenous Q24H  . ferrous sulfate  325 mg Oral Daily  . folic acid  1 mg Oral Daily  . furosemide  40 mg Intravenous Q12H  . gabapentin  300 mg Oral QHS  . insulin aspart  0-9 Units Subcutaneous TID WC  . latanoprost  1 drop Both Eyes QHS  . metoprolol succinate  50 mg Oral BID  . pantoprazole  40 mg Oral q12n4p  . potassium chloride  10 mEq Oral QHS  . potassium chloride  20 mEq Oral Daily  . simvastatin  40 mg Oral Daily  . Warfarin - Pharmacist Dosing Inpatient   Does not apply q1800    Time spent on care of this patient: 40 mins   Allie Bossier , MD   [facility] Office  [phone] Pager [phone]  On-Call/Text Page:      [email or web address]      password [email or web address]  If 7PM-7AM, please contact night-coverage [email or web address] Password [email or web address] 10/30/2013, 9:12 AM   LOS: 1 day

## 2013-10-30 NOTE — Progress Notes (Signed)
sats 94% while amnulating on RA

## 2013-10-31 ENCOUNTER — Telehealth: Payer: Self-pay | Admitting: Cardiology

## 2013-10-31 DIAGNOSIS — J189 Pneumonia, unspecified organism: Secondary | ICD-10-CM | POA: Diagnosis not present

## 2013-10-31 LAB — CBC WITH DIFFERENTIAL/PLATELET
Basophils Absolute: 0 10*3/uL (ref 0.0–0.1)
Basophils Relative: 0 % (ref 0–1)
Eosinophils Absolute: 0 10*3/uL (ref 0.0–0.7)
Eosinophils Relative: 0 % (ref 0–5)
HCT: 32.1 % — ABNORMAL LOW (ref 36.0–46.0)
Hemoglobin: 10.1 g/dL — ABNORMAL LOW (ref 12.0–15.0)
Lymphocytes Relative: 10 % — ABNORMAL LOW (ref 12–46)
Lymphs Abs: 1.2 10*3/uL (ref 0.7–4.0)
MCH: 27.7 pg (ref 26.0–34.0)
MCHC: 31.5 g/dL (ref 30.0–36.0)
MCV: 88.2 fL (ref 78.0–100.0)
Monocytes Absolute: 1 10*3/uL (ref 0.1–1.0)
Monocytes Relative: 9 % (ref 3–12)
Neutro Abs: 9.8 10*3/uL — ABNORMAL HIGH (ref 1.7–7.7)
Neutrophils Relative %: 81 % — ABNORMAL HIGH (ref 43–77)
Platelets: 222 10*3/uL (ref 150–400)
RBC: 3.64 MIL/uL — ABNORMAL LOW (ref 3.87–5.11)
RDW: 15.8 % — ABNORMAL HIGH (ref 11.5–15.5)
WBC: 12 10*3/uL — ABNORMAL HIGH (ref 4.0–10.5)

## 2013-10-31 LAB — GLUCOSE, CAPILLARY
Glucose-Capillary: 188 mg/dL — ABNORMAL HIGH (ref 70–99)
Glucose-Capillary: 129 mg/dL — ABNORMAL HIGH (ref 70–99)
Glucose-Capillary: 116 mg/dL — ABNORMAL HIGH (ref 70–99)

## 2013-10-31 LAB — PROTIME-INR
INR: 3.71 — ABNORMAL HIGH (ref 0.00–1.49)
Prothrombin Time: 35.4 seconds — ABNORMAL HIGH (ref 11.6–15.2)

## 2013-10-31 LAB — RESPIRATORY VIRUS PANEL
Adenovirus: NOT DETECTED
Influenza A H1: NOT DETECTED
Influenza A H3: NOT DETECTED
Influenza A: NOT DETECTED
Influenza B: NOT DETECTED
Metapneumovirus: NOT DETECTED
Parainfluenza 1: NOT DETECTED
Parainfluenza 2: NOT DETECTED
Parainfluenza 3: NOT DETECTED
Respiratory Syncytial Virus A: NOT DETECTED
Respiratory Syncytial Virus B: NOT DETECTED
Rhinovirus: DETECTED — AB

## 2013-10-31 LAB — COMPREHENSIVE METABOLIC PANEL
ALT: 8 U/L (ref 0–35)
AST: 13 U/L (ref 0–37)
Albumin: 3.5 g/dL (ref 3.5–5.2)
Alkaline Phosphatase: 67 U/L (ref 39–117)
BUN: 18 mg/dL (ref 6–23)
CO2: 21 mEq/L (ref 19–32)
Calcium: 9.3 mg/dL (ref 8.4–10.5)
Chloride: 104 mEq/L (ref 96–112)
Creatinine, Ser: 0.77 mg/dL (ref 0.50–1.10)
GFR calc Af Amer: >90 mL/min (ref >90)
GFR calc non Af Amer: 84 mL/min — ABNORMAL LOW (ref >90)
Glucose, Bld: 111 mg/dL — ABNORMAL HIGH (ref 70–99)
Potassium: 4.3 mEq/L (ref 3.7–5.3)
Sodium: 141 mEq/L (ref 137–147)
Total Bilirubin: 0.7 mg/dL (ref 0.3–1.2)
Total Protein: 7.3 g/dL (ref 6.0–8.3)

## 2013-10-31 MED ORDER — FUROSEMIDE 10 MG/ML IJ SOLN
INTRAMUSCULAR | Status: AC
  Filled 2013-10-31: qty 8

## 2013-10-31 MED ORDER — AZITHROMYCIN 500 MG PO TABS
500.0000 mg | ORAL_TABLET | ORAL | Status: DC
  Filled 2013-10-31: qty 1

## 2013-10-31 MED ORDER — WARFARIN SODIUM 1 MG PO TABS: 1.0000 mg | ORAL_TABLET | Freq: Once | ORAL | Status: DC

## 2013-10-31 MED ORDER — FUROSEMIDE 40 MG PO TABS
60.0000 mg | ORAL_TABLET | Freq: Two times a day (BID) | ORAL | Status: DC
  Filled 2013-10-31 (×2): qty 1

## 2013-10-31 MED ORDER — FUROSEMIDE 40 MG PO TABS: ORAL_TABLET | ORAL | Status: DC

## 2013-10-31 MED ORDER — AZITHROMYCIN 500 MG PO TABS: 500.0000 mg | ORAL_TABLET | Freq: Every day | ORAL | Status: DC

## 2013-10-31 MED ORDER — AZITHROMYCIN 500 MG PO TABS: 500.0000 mg | ORAL_TABLET | ORAL | Status: DC

## 2013-10-31 NOTE — Discharge Instructions (Signed)
Daily Weight Record It is important to weigh yourself daily. Keep this daily weight chart near your scale. Weigh yourself each morning at the same time. Weigh yourself without shoes and with the same amount of clothes each day. Compare today's weight to yesterday's weight. Bring this form with you to your follow-up appointments. Call your caregiver if you gain 03 lb/1.4 kg in 1 day. Call your caregiver if you gain 05 lb/2.3 kg in a week. Date: ________ Weight: ____________________ Date: ________ Weight: ____________________ Date: ________ Weight: ____________________ Date: ________ Weight: ____________________ Date: ________ Weight: ____________________ Date: ________ Weight: ____________________ Date: ________ Weight: ____________________ Date: ________ Weight: ____________________ Date: ________ Weight: ____________________ Date: ________ Weight: ____________________ Date: ________ Weight: ____________________ Date: ________ Weight: ____________________ Date: ________ Weight: ____________________ Date: ________ Weight: ____________________ Date: ________ Weight: ____________________ Date: ________ Weight: ____________________ Date: ________ Weight: ____________________ Date: ________ Weight: ____________________ Date: ________ Weight: ____________________ Date: ________ Weight: ____________________ Date: ________ Weight: ____________________ Date: ________ Weight: ____________________ Date: ________ Weight: ____________________ Date: ________ Weight: ____________________ Date: ________ Weight: ____________________ Date: ________ Weight: ____________________ Date: ________ Weight: ____________________ Date: ________ Weight: ____________________ Date: ________ Weight: ____________________ Date: ________ Weight: ____________________ Date: ________ Weight: ____________________ Date: ________ Weight: ____________________ Date: ________ Weight: ____________________ Date: ________ Weight:  ____________________ Date: ________ Weight: ____________________ Date: ________ Weight: ____________________ Date: ________ Weight: ____________________ Date: ________ Weight: ____________________ Date: ________ Weight: ____________________ Date: ________ Weight: ____________________ Date: ________ Weight: ____________________ Date: ________ Weight: ____________________ Date: ________ Weight: ____________________ Date: ________ Weight: ____________________ Date: ________ Weight: ____________________ Date: ________ Weight: ____________________ Date: ________ Weight: ____________________ Date: ________ Weight: ____________________ Date: ________ Weight: ____________________ Date: ________ Weight: ____________________ Document Released: 07/22/2006 Document Revised: 08/02/2011 Document Reviewed: 04/28/2007 [employer] Patient Information 2014 [employer] Heart Failure Heart failure is a condition in which the heart has trouble pumping blood. This means your heart does not pump blood efficiently for your body to work well. In some cases of heart failure, fluid may back up into your lungs or you may have swelling (edema) in your lower legs. Heart failure is usually a long-term (chronic) condition. It is important for you to take good care of yourself and follow your caregiver's treatment plan. CAUSES  Some health conditions can cause heart failure. Those health conditions include:  High blood pressure (hypertension) causes the heart muscle to work harder than normal. When pressure in the blood vessels is high, the heart needs to pump (contract) with more force in order to circulate blood throughout the body. High blood pressure eventually causes the heart to become stiff and weak.  Coronary artery disease (CAD) is the buildup of cholesterol and fat (plaque) in the arteries of the heart. The blockage in the arteries deprives the heart muscle of oxygen and blood. This can cause chest pain and may lead to  a heart attack. High blood pressure can also contribute to CAD.  Heart attack (myocardial infarction) occurs when 1 or more arteries in the heart become blocked. The loss of oxygen damages the muscle tissue of the heart. When this happens, part of the heart muscle dies. The injured tissue does not contract as well and weakens the heart's ability to pump blood.  Abnormal heart valves can cause heart failure when the heart valves do not open and close properly. This makes the heart muscle pump harder to keep the blood flowing.  Heart muscle disease (cardiomyopathy or myocarditis) is damage to the heart muscle from a variety of causes. These can include drug or alcohol abuse, infections, or unknown reasons.  These can increase the risk of heart failure.  Lung disease makes the heart work harder because the lungs do not work properly. This can cause a strain on the heart, leading it to fail.  Diabetes increases the risk of heart failure. High blood sugar contributes to high fat (lipid) levels in the blood. Diabetes can also cause slow damage to tiny blood vessels that carry important nutrients to the heart muscle. When the heart does not get enough oxygen and food, it can cause the heart to become weak and stiff. This leads to a heart that does not contract efficiently.  Other conditions can contribute to heart failure. These include abnormal heart rhythms, thyroid problems, and low blood counts (anemia). Certain unhealthy behaviors can increase the risk of heart failure. Those unhealthy behaviors include:  Being overweight.  Smoking or chewing tobacco.  Eating foods high in fat and cholesterol.  Abusing illicit drugs or alcohol.  Lacking physical activity. SYMPTOMS  Heart failure symptoms may vary and can be hard to detect. Symptoms may include:  Shortness of breath with activity, such as climbing stairs.  Persistent cough.  Swelling of the feet, ankles, legs, or abdomen.  Unexplained  weight gain.  Difficulty breathing when lying flat (orthopnea).  Waking from sleep because of the need to sit up and get more air.  Rapid heartbeat.  Fatigue and loss of energy.  Feeling lightheaded, dizzy, or close to fainting.  Loss of appetite.  Nausea.  Increased urination during the night (nocturia). DIAGNOSIS  A diagnosis of heart failure is based on your history, symptoms, physical examination, and diagnostic tests. Diagnostic tests for heart failure may include:  Echocardiography.  Electrocardiography.  Chest X-ray.  Blood tests.  Exercise stress test.  Cardiac angiography.  Radionuclide scans. TREATMENT  Treatment is aimed at managing the symptoms of heart failure. Medicines, behavioral changes, or surgical intervention may be necessary to treat heart failure.  Medicines to help treat heart failure may include:  Angiotensin-converting enzyme (ACE) inhibitors. This type of medicine blocks the effects of a blood protein called angiotensin-converting enzyme. ACE inhibitors relax (dilate) the blood vessels and help lower blood pressure.  Angiotensin receptor blockers. This type of medicine blocks the actions of a blood protein called angiotensin. Angiotensin receptor blockers dilate the blood vessels and help lower blood pressure.  Water pills (diuretics). Diuretics cause the kidneys to remove salt and water from the blood. The extra fluid is removed through urination. This loss of extra fluid lowers the volume of blood the heart pumps.  Beta blockers. These prevent the heart from beating too fast and improve heart muscle strength.  Digitalis. This increases the force of the heartbeat.  Healthy behavior changes include:  Obtaining and maintaining a healthy weight.  Stopping smoking or chewing tobacco.  Eating heart healthy foods.  Limiting or avoiding alcohol.  Stopping illicit drug use.  Physical activity as directed by your caregiver.  Surgical  treatment for heart failure may include:  A procedure to open blocked arteries, repair damaged heart valves, or remove damaged heart muscle tissue.  A pacemaker to improve heart muscle function and control certain abnormal heart rhythms.  An internal cardioverter defibrillator to treat certain serious abnormal heart rhythms.  A left ventricular assist device to assist the pumping ability of the heart. HOME CARE INSTRUCTIONS   Take your medicine as directed by your caregiver. Medicines are important in reducing the workload of your heart, slowing the progression of heart failure, and improving your symptoms.  Do  not stop taking your medicine unless directed by your caregiver.  Do not skip any dose of medicine.  Refill your prescriptions before you run out of medicine. Your medicines are needed every day.  Take over-the-counter medicine only as directed by your caregiver or pharmacist.  Engage in moderate physical activity if directed by your caregiver. Moderate physical activity can benefit some people. The elderly and people with severe heart failure should consult with a caregiver for physical activity recommendations.  Eat heart healthy foods. Food choices should be free of trans fat and low in saturated fat, cholesterol, and salt (sodium). Healthy choices include fresh or frozen fruits and vegetables, fish, lean meats, legumes, fat-free or low-fat dairy products, and whole grain or high fiber foods. Talk to a dietitian to learn more about heart healthy foods.  Limit sodium if directed by your caregiver. Sodium restriction may reduce symptoms of heart failure in some people. Talk to a dietitian to learn more about heart healthy seasonings.  Use healthy cooking methods. Healthy cooking methods include roasting, grilling, broiling, baking, poaching, steaming, or stir-frying. Talk to a dietitian to learn more about healthy cooking methods.  Limit fluids if directed by your caregiver.  Fluid restriction may reduce symptoms of heart failure in some people.  Weigh yourself every day. Daily weights are important in the early recognition of excess fluid. You should weigh yourself every morning after you urinate and before you eat breakfast. Wear the same amount of clothing each time you weigh yourself. Record your daily weight. Provide your caregiver with your weight record.  Monitor and record your blood pressure if directed by your caregiver.  Check your pulse if directed by your caregiver.  Lose weight if directed by your caregiver. Weight loss may reduce symptoms of heart failure in some people.  Stop smoking or chewing tobacco. Nicotine makes your heart work harder by causing your blood vessels to constrict. Do not use nicotine gum or patches before talking to your caregiver.  Schedule and attend follow-up visits as directed by your caregiver. It is important to keep all your appointments.  Limit alcohol intake to no more than 1 drink per day for nonpregnant women and 2 drinks per day for men. Drinking more than that is harmful to your heart. Tell your caregiver if you drink alcohol several times a week. Talk with your caregiver about whether alcohol is safe for you. If your heart has already been damaged by alcohol or you have severe heart failure, drinking alcohol should be stopped completely.  Stop illicit drug use.  Stay up-to-date with immunizations. It is especially important to prevent respiratory infections through current pneumococcal and influenza immunizations.  Manage other health conditions such as hypertension, diabetes, thyroid disease, or abnormal heart rhythms as directed by your caregiver.  Learn to manage stress.  Plan rest periods when fatigued.  Learn strategies to manage high temperatures. If the weather is extremely hot:  Avoid vigorous physical activity.  Use air conditioning or fans or seek a cooler location.  Avoid caffeine and  alcohol.  Wear loose-fitting, lightweight, and light-colored clothing.  Learn strategies to manage cold temperatures. If the weather is extremely cold:  Avoid vigorous physical activity.  Layer clothes.  Wear mittens or gloves, a hat, and a scarf when going outside.  Avoid alcohol.  Obtain ongoing education and support as needed.  Participate or seek rehabilitation as needed to maintain or improve independence and quality of life. SEEK MEDICAL CARE IF:   Your weight increases  by 03 lb/1.4 kg in 1 day or 05 lb/2.3 kg in a week.  You have increasing shortness of breath that is unusual for you.  You are unable to participate in your usual physical activities.  You tire easily.  You cough more than normal, especially with physical activity.  You have any or more swelling in areas such as your hands, feet, ankles, or abdomen.  You are unable to sleep because it is hard to breathe.  You feel like your heart is beating fast (palpitations).  You become dizzy or lightheaded upon standing up. SEEK IMMEDIATE MEDICAL CARE IF:   You have difficulty breathing.  There is a change in mental status such as decreased alertness or difficulty with concentration.  You have a pain or discomfort in your chest.  You have an episode of fainting (syncope). MAKE SURE YOU:   Understand these instructions.  Will watch your condition.  Will get help right away if you are not doing well or get worse. Document Released: 05/10/2005 Document Revised: 09/04/2012 Document Reviewed: 06/01/2012 [facility] Patient Information 2014 [place], [place].     Information on my medicine - Coumadin   (Warfarin)  Why was Coumadin prescribed for you? Coumadin was prescribed for you because you have a blood clot or a medical condition that can cause an increased risk of forming blood clots. Blood clots can cause serious health problems by blocking the flow of blood to the heart, lung, or brain. Coumadin can  prevent harmful blood clots from forming. As a reminder your indication for Coumadin is:   Select from menu  What test will check on my response to Coumadin? While on Coumadin (warfarin) you will need to have an INR test regularly to ensure that your dose is keeping you in the desired range. The INR (international normalized ratio) number is calculated from the result of the laboratory test called prothrombin time (PT).  If an INR APPOINTMENT HAS NOT ALREADY BEEN MADE FOR YOU please schedule an appointment to have this lab work done by your health care provider within 7 days. Your INR goal is usually a number between:  2 to 3 or your provider may give you a more narrow range like 2-2.5.  Ask your health care provider during an office visit what your goal INR is.  What  do you need to  know  About  COUMADIN? Take Coumadin (warfarin) exactly as prescribed by your healthcare provider about the same time each day.  DO NOT stop taking without talking to the doctor who prescribed the medication.  Stopping without other blood clot prevention medication to take the place of Coumadin may increase your risk of developing a new clot or stroke.  Get refills before you run out.  What do you do if you miss a dose? If you miss a dose, take it as soon as you remember on the same day then continue your regularly scheduled regimen the next day.  Do not take two doses of Coumadin at the same time.  Important Safety Information A possible side effect of Coumadin (Warfarin) is an increased risk of bleeding. You should call your healthcare provider right away if you experience any of the following:   Bleeding from an injury or your nose that does not stop.   Unusual colored urine (red or dark brown) or unusual colored stools (red or black).   Unusual bruising for unknown reasons.   A serious fall or if you hit your head (even if there  is no bleeding).  Some foods or medicines interact with Coumadin (warfarin) and  might alter your response to warfarin. To help avoid this:   Eat a balanced diet, maintaining a consistent amount of Vitamin K.   Notify your provider about major diet changes you plan to make.   Avoid alcohol or limit your intake to 1 drink for women and 2 drinks for men per day. (1 drink is 5 oz. wine, 12 oz. beer, or 1.5 oz. liquor.)  Make sure that ANY health care provider who prescribes medication for you knows that you are taking Coumadin (warfarin).  Also make sure the healthcare provider who is monitoring your Coumadin knows when you have started a new medication including herbals and non-prescription products.  Coumadin (Warfarin)  Major Drug Interactions  Increased Warfarin Effect Decreased Warfarin Effect  Alcohol (large quantities) Antibiotics (esp. Septra/Bactrim, Flagyl, Cipro) Amiodarone (Cordarone) Aspirin (ASA) Cimetidine (Tagamet) Megestrol (Megace) NSAIDs (ibuprofen, naproxen, etc.) Piroxicam (Feldene) Propafenone (Rythmol SR) Propranolol (Inderal) Isoniazid (INH) Posaconazole (Noxafil) Barbiturates (Phenobarbital) Carbamazepine (Tegretol) Chlordiazepoxide (Librium) Cholestyramine (Questran) Griseofulvin Oral Contraceptives Rifampin Sucralfate (Carafate) Vitamin K   Coumadin (Warfarin) Major Herbal Interactions  Increased Warfarin Effect Decreased Warfarin Effect  Garlic Ginseng Ginkgo biloba Coenzyme Q10 Green tea St. Johns wort    Coumadin (Warfarin) FOOD Interactions  Eat a consistent number of servings per week of foods HIGH in Vitamin K (1 serving =  cup)  Collards (cooked, or boiled & drained) Kale (cooked, or boiled & drained) Mustard greens (cooked, or boiled & drained) Parsley *serving size only =  cup Spinach (cooked, or boiled & drained) Swiss chard (cooked, or boiled & drained) Turnip greens (cooked, or boiled & drained)  Eat a consistent number of servings per week of foods MEDIUM-HIGH in Vitamin K (1 serving = 1 cup)  Asparagus  (cooked, or boiled & drained) Broccoli (cooked, boiled & drained, or raw & chopped) Brussel sprouts (cooked, or boiled & drained) *serving size only =  cup Lettuce, raw (green leaf, endive, romaine) Spinach, raw Turnip greens, raw & chopped   These websites have more information on Coumadin (warfarin):  [employer]se; VeganReport.com.au;

## 2013-10-31 NOTE — Telephone Encounter (Signed)
New message     Pt is in the hosp and had an echo yesterday.  She want her echo results

## 2013-10-31 NOTE — Discharge Summary (Signed)
Physician Discharge Summary  [name] WNU:[id number] DOB: Jul 27, 1945 DOA: 10/29/2013  PCP: Vertell Novak, MD  Admit date: 10/29/2013 Discharge date: 10/31/2013  Time spent: >30 minutes  Recommendations for Outpatient Follow-up:  1. Arrange post hospital visit with your PCP for 1-2 weeks after discharge 2. Arrange post hospital visit with your Cardiologist in 1-2 weeks after discharge 3. Follow CHF discharge instructions to include daily weights/record 4. Noted with multiple pulmonary nodules on CT this admission-radiologist recommends repeat CT Chest in 3 months  Discharge Diagnoses:    Acute respiratory failure with hypoxia/Acute diastolic heart failure   DEPRESSION, MAJOR, RECURRENT, MODERATE   PERIPHERAL NEUROPATHY   Diastolic dysfunction with NL LVF 2D August 2012   PVD, Rt SFA PTA/Stent 09/20/11   Type II diabetes mellitus   Dysphagia, pharyngoesophageal phase   Anemia, iron deficiency  Discharge Condition: stable  Diet recommendation: Carbohydrate modified, heart healthy  Filed Weights   10/29/13 1930 10/29/13 2207 10/30/13 0425  Weight: 188 lb 12.8 oz (85.639 kg) 187 lb 2.7 oz (84.9 kg) 187 lb 2.7 oz (84.9 kg)    History of present illness:  68yo with a known history of diastolic heart failure presented with acute respiratory failure with hypoxia. Patient stated that she developed a mild respiratory infection about 3-4 weeks ago with symptoms including cough and rhinorrhea. Was seen by PCP 2-3 weeks ago and was placed on amoxicillin at second visit. Patient stated the cough persisted despite treatment. Patient was also switched from torsemide to Lasix because of cost issues about one week prior to presentation. Patient stated that since that change she's had progression of lower extremity swelling and worsening orthopnea. Her dry weight went from 180s to the 190s. Was put back on twice a day Lasix 2 days prior to presentation. Patient denied taking NSAIDs or eating  salty foods.   Was seen in ER 6/8  for her dyspnea. Chest x-ray was negative the patient was subsequently discharged home. Follow up in the PCPs office 6/9. She was observed with worsening dyspnea and was directed back to the ER.   On presentation to the ER, MAXIMUM TEMPERATURE 100.2. Heart rate in the 40s to 60s. Pressure in the 120s to 170s. Satting in the upper 80s on room air. White blood cell count 7.3. Hemoglobin 9.9. Creatinine 0.72. INR 3.73. ProBNP 2600. Initial chest x-ray showed interstitial edema. Followup chest CT show bilateral pleural effusions with mild CHF as well as right lower lobe pneumonia and scattered bilateral pulmonary nodules. Patient start vancomycin cefepime by ER physician. Also given 80 mg of IV Lasix x1.  Hospital Course:   Acute respiratory failure with hypoxia: A) CAP:  -Likely multifactorial contributions of CHF exacerbation and community acquired pneumonia.  - Was initially treated with Rocephin and azithromycin and at time of dc was transitioned to PO Zithromax to complete  -Urine strep and Legionella negative -Respiratory virus panel pending at time of discharge -Ambulatory SpO2 94% on room air   B) Acute diastolic CHF  -dry weight 180 - admission weight= 5.6 kg (188.7lbs) --Initially treated with IV Lasix 60 mg BID- will transition to PO dosing to continue at discharge -will continue 60 mg dose BID to be roughly equivalent to prior torsemide dose  -2-D echocardiogram revealed normal systolic function and no evidence of diastolic dysfunction -cont daily weights after discharge  C) Multiple pulmonary nodules  -Will need further evaluation as an outpatinet after patient's acute respiratory illness treated.   CAD/Aortic valve replacement:  -Coumadin  per pharmacy. Continue outpt regimen.   HTN  -Continue metoprolol 50 mg BID   HLD  -no change in tx   DM/peripheral neuropathy  -6/8 Hemoglobin A1c= 5.8  -resume Metformin at  discharge  Procedures: ECHO:  - Left ventricle: The cavity size was normal. Wall thickness was increased in a pattern of mild LVH. Systolic function was normal. The estimated ejection fraction was in the range of 60% to 65%. Wall motion was normal; there were no regional wall motion abnormalities. Left ventricular diastolic function parameters were normal. - Aortic valve: A bioprosthesis was present. There was mild stenosis. There was trivial regurgitation. - Mitral valve: Calcified annulus. There was mild regurgitation. - Left atrium: The atrium was mildly dilated.  Consultations:  None  Discharge Exam: Blood pressure 132/56, pulse 58, temperature 97.7 F (36.5 C), temperature source Oral, resp. rate 30, height 5\' 2"  (1.575 m), weight 84.9 kg (187 lb 2.7 oz), SpO2 83.00%.  General: A./O. x4, NAD, No acute respiratory distress  Lungs: CTA on RA- no wheezes or crackles, no orthopnea Cardiovascular: Regular rate and rhythm without murmur gallop or rub normal S1 and S2  Abdomen: Nontender, nondistended, soft, bowel sounds positive, no rebound, no ascites, no appreciable mass  Extremities: No significant cyanosis, clubbing, or edema bilateral lower extremities   Discharge Instructions      Discharge Instructions   (HEART FAILURE PATIENTS) Call MD:  Anytime you have any of the following symptoms: 1) 3 pound weight gain in 24 hours or 5 pounds in 1 week 2) shortness of breath, with or without a dry hacking cough 3) swelling in the hands, feet or stomach 4) if you have to sleep on extra pillows at night in order to breathe.    Complete by:  As directed      Call MD for:  extreme fatigue    Complete by:  As directed      Call MD for:  persistant dizziness or light-headedness    Complete by:  As directed      Call MD for:  temperature >100.4    Complete by:  As directed      Diet - low sodium heart healthy    Complete by:  As directed      Diet Carb Modified    Complete by:  As  directed      Increase activity slowly    Complete by:  As directed             Medication List    STOP taking these medications       amoxicillin 500 MG capsule  Commonly known as:  AMOXIL      TAKE these medications       azithromycin 500 MG tablet  Commonly known as:  ZITHROMAX  Take 1 tablet (500 mg total) by mouth daily.     CALCIUM 500+D 500-200 MG-UNIT per tablet  Generic drug:  calcium-vitamin D  Take 1 tablet by mouth 2 (two) times daily.     ferrous sulfate 325 (65 FE) MG tablet  Take 1 tablet (325 mg total) by mouth daily.     FEXOFENADINE HCL PO  Take 1 tablet by mouth every 12 (twelve) hours.     folic acid 1 MG tablet  Commonly known as:  FOLVITE  Take 1 tablet (1 mg total) by mouth daily.     furosemide 40 MG tablet  Commonly known as:  LASIX  Take 1 1/2 40 mg tabs twice daily  gabapentin 300 MG capsule  Commonly known as:  NEURONTIN  Take 1 capsule (300 mg total) by mouth at bedtime.     HYDROcodone-homatropine 5-1.5 MG/5ML syrup  Commonly known as:  HYDROMET  Take 5 mLs by mouth every 6 (six) hours as needed for cough.     latanoprost 0.005 % ophthalmic solution  Commonly known as:  XALATAN  Place 1 drop into both eyes at bedtime.     metFORMIN 500 MG tablet  Commonly known as:  GLUCOPHAGE  Take 1,000 mg by mouth 2 (two) times daily with a meal.     metoprolol succinate 50 MG 24 hr tablet  Commonly known as:  TOPROL-XL  Take 1 tablet (50 mg total) by mouth 2 (two) times daily.     MUCINEX PO  Take 1 tablet by mouth daily.     pantoprazole 40 MG tablet  Commonly known as:  PROTONIX  Take 40 mg by mouth 2 (two) times daily as needed.     potassium chloride 10 MEQ tablet  Commonly known as:  K-DUR  Take 10-20 mEq by mouth 2 (two) times daily. 2 tablets in the morning and 1 tablet at night     simvastatin 40 MG tablet  Commonly known as:  ZOCOR  Take 40 mg by mouth daily.     traZODone 50 MG tablet  Commonly known as:   DESYREL  Take 0.5-1 tablets (25-50 mg total) by mouth at bedtime as needed for sleep.     vitamin C 500 MG tablet  Commonly known as:  ASCORBIC ACID  Take 500 mg by mouth 2 (two) times daily.     warfarin 3 MG tablet  Commonly known as:  COUMADIN  Take 3 mg by mouth daily. Or as directed     warfarin 1 MG tablet  Commonly known as:  COUMADIN  Take 1 tablet (1 mg total) by mouth one time only at 6 PM.       Allergies  Allergen Reactions  . Atorvastatin     Muscle pain  . Simvastatin Other (See Comments)    Muscle pain  . Sulfa Antibiotics   . Iodinated Diagnostic Agents Rash     Red rash after cardiac cath 1 wk ago, ? Contrast allergy, requires 13 hr prep now per [email or web address]   Follow-up Information   Follow up with Vertell Novak, MD. (call to arrange post hospital visit - needs to be scheduled in 1-2 weeks)    Specialty:  Internal Medicine   Contact information:   [place] [address] [phone]       Schedule an appointment as soon as possible for a visit with Loralie Champagne, MD. (arrange appointment for 1-2 weeks after discharge)    Specialty:  Cardiology   Contact information:   [address] [place] [address] [phone]      Microbiology: Recent Results (from the past 240 hour(s))  CULTURE, BLOOD (ROUTINE X 2)     Status: None   Collection Time    10/29/13  8:09 PM      Result Value Ref Range Status   Specimen Description BLOOD LEFT FOREARM   Final   Special Requests BOTTLES DRAWN AEROBIC ONLY 2CC   Final   Culture  Setup Time     Final   Value: 10/30/2013 02:29     Performed at [facility]   Culture     Final   Value:  BLOOD CULTURE RECEIVED NO GROWTH TO DATE CULTURE WILL BE HELD FOR 5 DAYS BEFORE ISSUING A FINAL NEGATIVE REPORT     Performed at [facility]   Report Status PENDING   Incomplete  CULTURE, BLOOD (ROUTINE X 2)     Status: None   Collection Time    10/29/13  8:15  PM      Result Value Ref Range Status   Specimen Description BLOOD RIGHT HAND   Final   Special Requests BOTTLES DRAWN AEROBIC AND ANAEROBIC 10CC   Final   Culture  Setup Time     Final   Value: 10/30/2013 02:29     Performed at [facility]   Culture     Final   Value:        BLOOD CULTURE RECEIVED NO GROWTH TO DATE CULTURE WILL BE HELD FOR 5 DAYS BEFORE ISSUING A FINAL NEGATIVE REPORT     Performed at [facility]   Report Status PENDING   Incomplete  MRSA PCR SCREENING     Status: None   Collection Time    10/29/13 10:36 PM      Result Value Ref Range Status   MRSA by PCR NEGATIVE  NEGATIVE Final   Comment:            The GeneXpert MRSA Assay (FDA     approved for NASAL specimens     only), is one component of a     comprehensive MRSA colonization     surveillance program. It is not     intended to diagnose MRSA     infection nor to guide or     monitor treatment for     MRSA infections.    Labs: Basic Metabolic Panel:  Recent Labs Lab 10/28/13 1425 10/29/13 1350 10/30/13 0230 10/31/13 0332  NA 143 142 141 141  K 4.1 4.5 4.1 4.3  CL 106 106 102 104  CO2 24 20 25 21   GLUCOSE 109* 95 162* 111*  BUN 9 7 9 18   CREATININE 0.69 0.72 0.72 0.77  CALCIUM 9.4 9.6 9.5 9.3   Liver Function Tests:  Recent Labs Lab 10/30/13 0230 10/31/13 0332  AST 18 13  ALT 10 8  ALKPHOS 79 67  BILITOT 1.1 0.7  PROT 8.0 7.3  ALBUMIN 3.8 3.5   CBC:  Recent Labs Lab 10/28/13 1425 10/29/13 1350 10/30/13 0230 10/31/13 0332  WBC 6.2 7.3 7.1 12.0*  NEUTROABS 4.0  --  6.0 9.8*  HGB 8.9* 9.9* 10.3* 10.1*  HCT 29.1* 31.2* 33.0* 32.1*  MCV 88.2 87.6 87.3 88.2  PLT 195 198 213 222   Cardiac Enzymes:  Recent Labs Lab 10/28/13 1425  TROPONINI <0.30   BNP: BNP (last 3 results)  Recent Labs  11/06/12 1219 01/01/13 0808 10/29/13 1550  PROBNP 216.0* 55.0 2638.0*   CBG:  Recent Labs Lab 10/30/13 1221 10/30/13 1736 10/30/13 2134 10/31/13 0747  10/31/13 1157  GLUCAP 169* 159* 188* 129* 116*   Signed:  ELLIS,ALLISON L. ANP [facility] 10/31/2013, 2:17 PM  I have personally examined this patient and reviewed the entire database. I have reviewed the above note, made any necessary editorial changes, and agree with its content.  Cherene Altes, MD [facility]

## 2013-10-31 NOTE — Progress Notes (Signed)
Pt is being discharged to home per md orders. Pt's iv removed, tip intact and pt tolerated well. Pt has stated no discomfort all morning and afternoon. Nurse reviewed prescriptions and d/c orders with pt and pt stated she understood and had no questions. Pt in no distress. Pt's husband is here to take pt to home.

## 2013-10-31 NOTE — Telephone Encounter (Signed)
NA

## 2013-10-31 NOTE — Progress Notes (Signed)
ANTICOAGULATION CONSULT NOTE - Initial Consult  Pharmacy Consult for warfarin Indication: St. Jude AVR  Allergies  Allergen Reactions  . Atorvastatin     Muscle pain  . Simvastatin Other (See Comments)    Muscle pain  . Sulfa Antibiotics   . Iodinated Diagnostic Agents Rash     Red rash after cardiac cath 1 wk ago, ? Contrast allergy, requires 13 hr prep now per [email or web address]//[email or web address]    Patient Measurements: Height: 5\' 2"  (157.5 cm) Weight: 187 lb 2.7 oz (84.9 kg) IBW/kg (Calculated) : 50.1 Heparin Dosing Weight:   Vital Signs: Temp: 98 F (36.7 C) (06/10 0752) Temp src: Oral (06/10 0752) BP: 136/58 mmHg (06/10 1033) Pulse Rate: 64 (06/10 1100)  Labs:  Recent Labs  10/28/13 1425 10/29/13 1350 10/30/13 0230 10/30/13 1213 10/31/13 0332  HGB 8.9* 9.9* 10.3*  --  10.1*  HCT 29.1* 31.2* 33.0*  --  32.1*  PLT 195 198 213  --  222  LABPROT  --  35.5*  --  33.7* 35.4*  INR  --  3.73*  --  3.48* 3.71*  CREATININE 0.69 0.72 0.72  --  0.77  TROPONINI <0.30  --   --   --   --     Estimated Creatinine Clearance: 68 ml/min (by C-G formula based on Cr of 0.77).   Medical History: Past Medical History  Diagnosis Date  . Coronary atherosclerosis of native coronary artery   . Hyperlipidemia   . Chronic angle-closure glaucoma(365.23)   . Aortic valve disorders   . Peripheral vascular disease   . Type II diabetes mellitus   . Depression   . PONV (postoperative nausea and vomiting)     N&V  . CHF (congestive heart failure)     diastolic heart failure  . GERD (gastroesophageal reflux disease)   . Neuromuscular disorder     neuropathy  . Arthritis     osteo  . Anemia   . Syncope 03/29/2012    carotid doppler - R ICA 50-69% reduction by velocities (low end); L ICA 0-49% reduction by velocities (low end); R and L subclavian arteries - <50% redcution; R and L vertebral arteries show normal antegrade flow  . Dizziness 02/09/2012    Holter monitor - sinus rhythm, no  ventricular ectopic beats, no sulpraventricular ectopic beats noted  . OSA (obstructive sleep apnea)     Medications:  See EMR  Assessment: 68 year old female on chronic anticoagulation for St. Jude AVR.  PTA warfarin dose: 3 mg po daily.  Last dose of warfarin prior to admission was on 6/7.  Warfarin was held 6/8 due to supratherapeutic INR.  INR today is slightly supratherapeutic at 3.7.     Goal of Therapy:  INR 2.5-3.5 Monitor platelets by anticoagulation protocol: Yes   Plan:  -Warfarin 1 mg po x1 -Daily INR -Monitor for s/sx bleeding   Hughes Better, PharmD, BCPS Clinical Pharmacist Pager: [phone] 10/31/2013 12:13 PM

## 2013-11-04 IMAGING — RF DG ESOPHAGUS
6 of 7 series · 14 of 19 positions shown · non-contrast
Comparison: CT chest 10/13/2011 reviewed.

CLINICAL DATA: Difficulty swallowing.

ESOPHOGRAM/BARIUM SWALLOW
TECHNIQUE: Combined double contrast and single contrast
examination performed using effervescent crystals, thick barium
liquid, and thin barium liquid.
Fluoroscopy time:  2 minutes, 39 seconds.

[Series 1: run · 3 of 4 slices shown (1 of 6)]
[im 1/4]
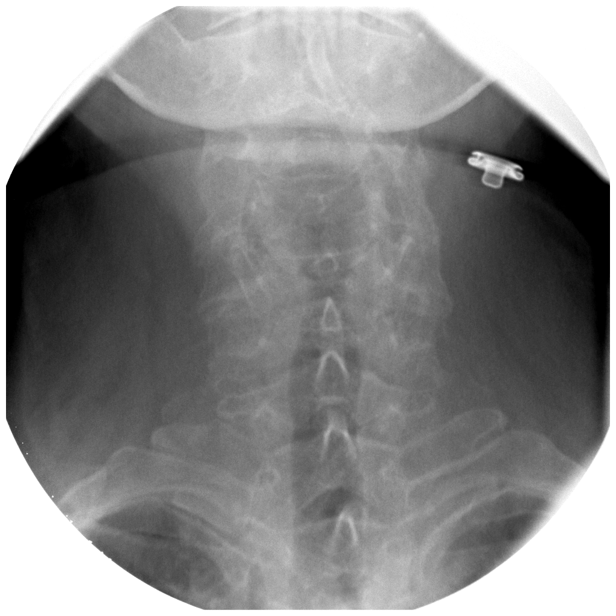
[im 3/4]
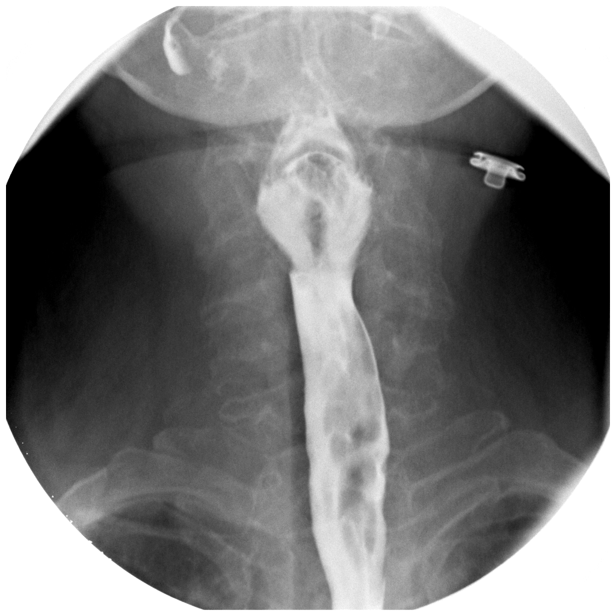
[im 4/4]
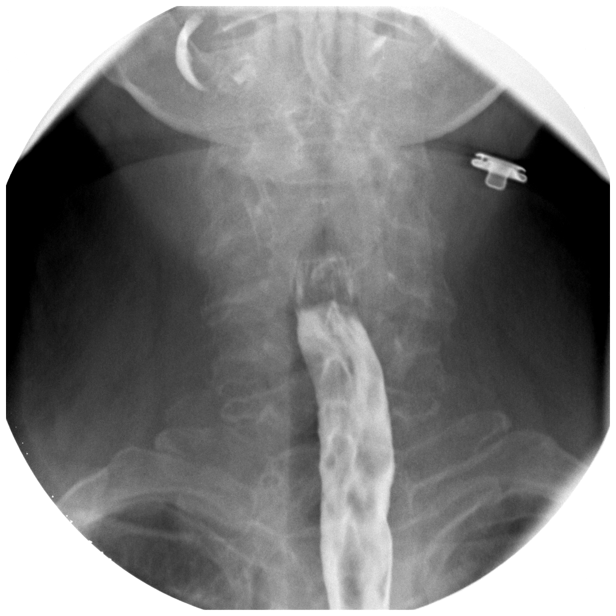

[Series 2: run · 4 of 5 slices shown (2 of 6)]
[im 1/5]
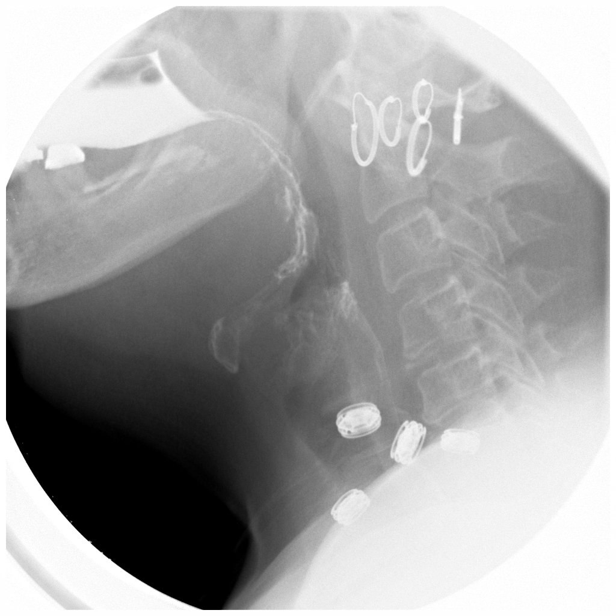
[im 3/5]
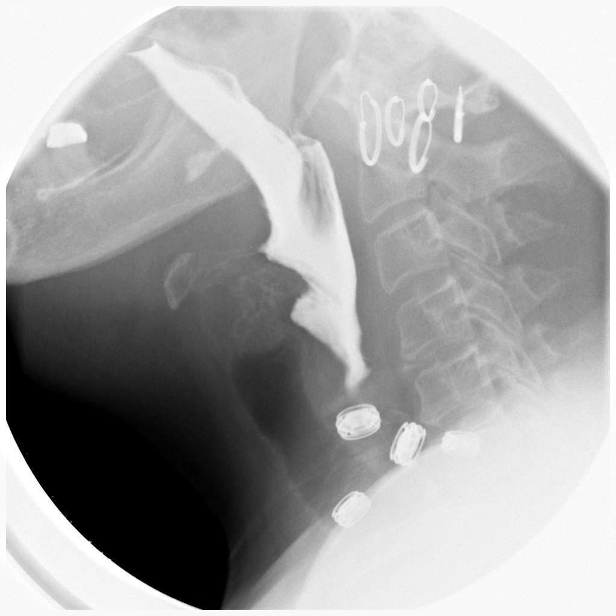
[im 4/5]
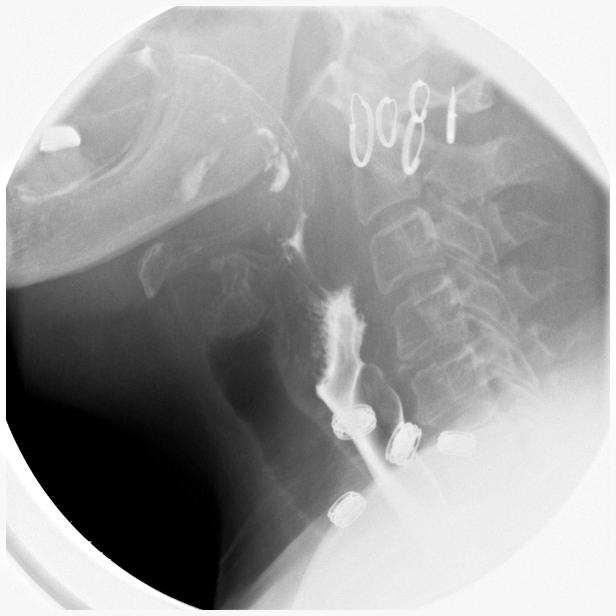
[im 5/5]
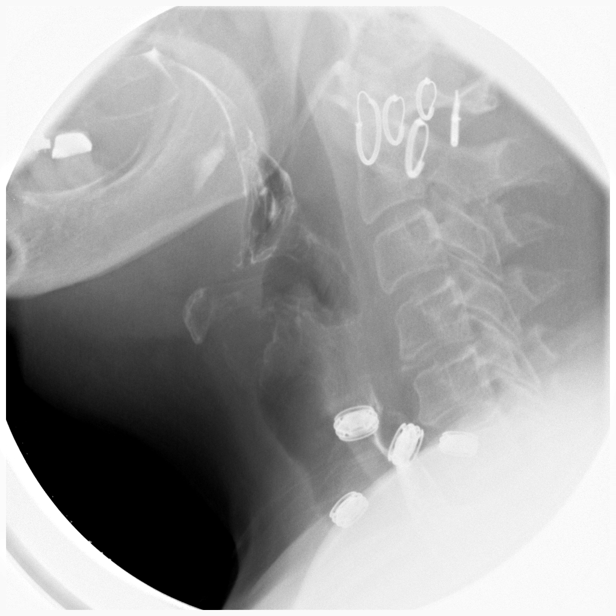

[Series 3: run · 1 of 2 slices shown (3 of 6)]
[im 2/2]
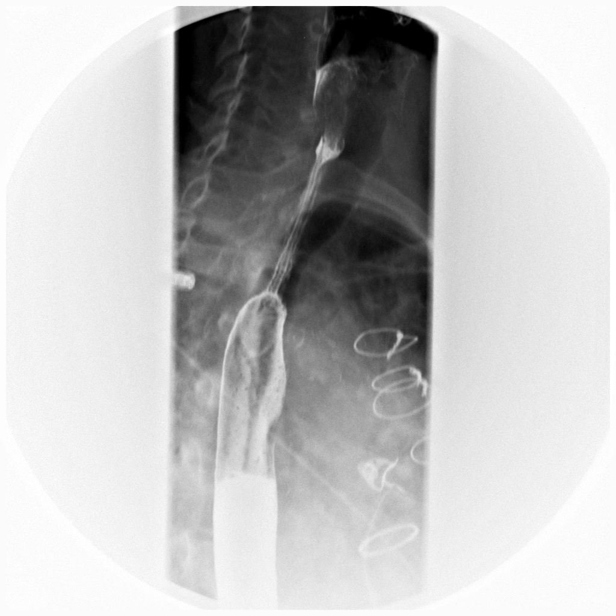

[Series 4: run · 2 of 2 slices shown (4 of 6)]
[im 1/2]
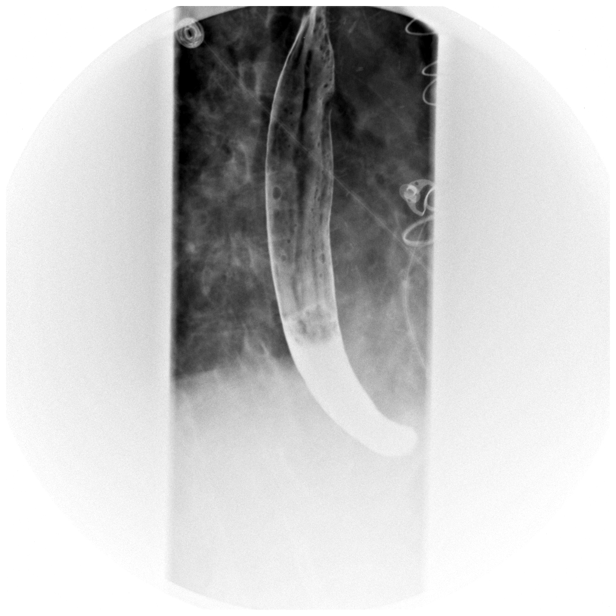
[im 2/2]
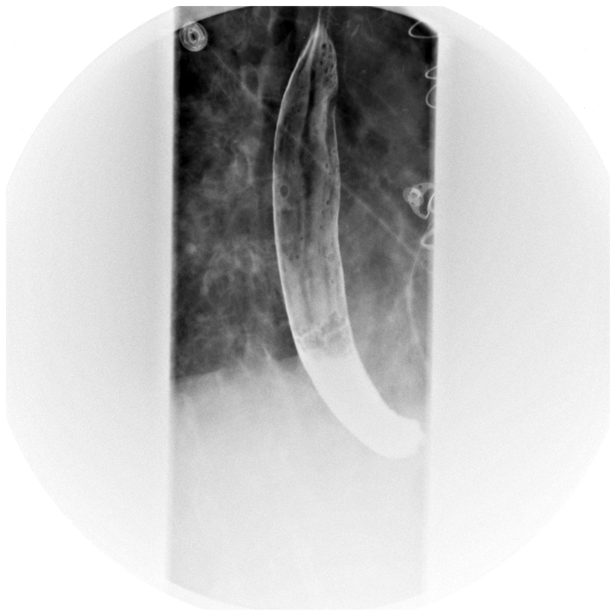

[Series 6: run · 2 of 2 slices shown (5 of 6)]
[im 1/2]
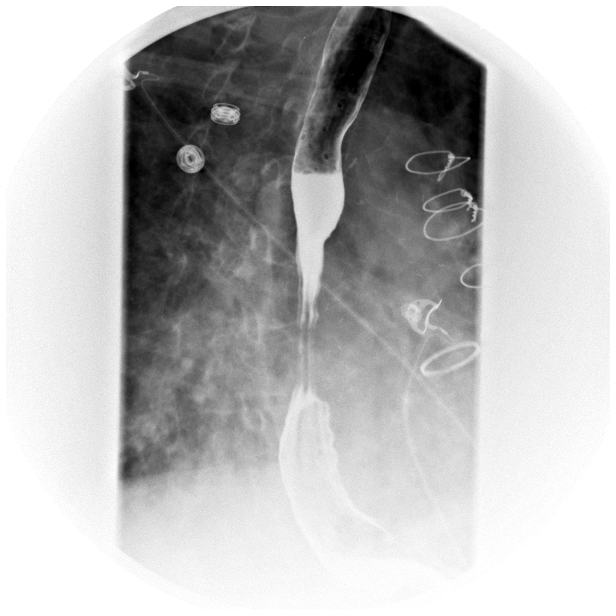
[im 2/2]
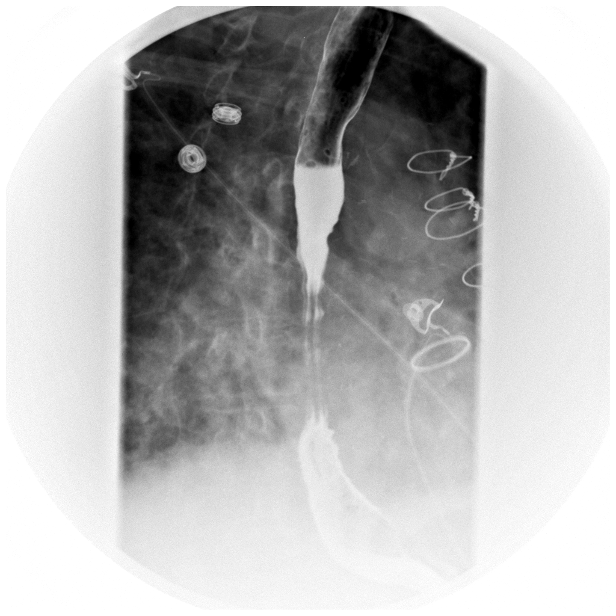

[Series 7: run · 2 of 3 slices shown (6 of 6)]
[im 1/3]
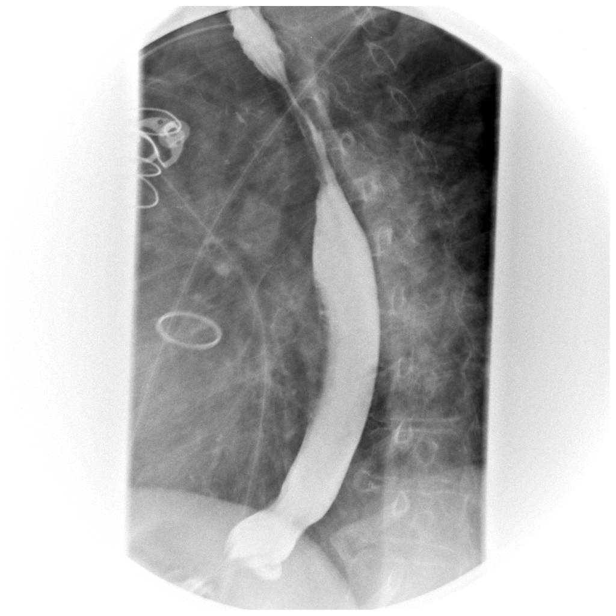
[im 3/3]
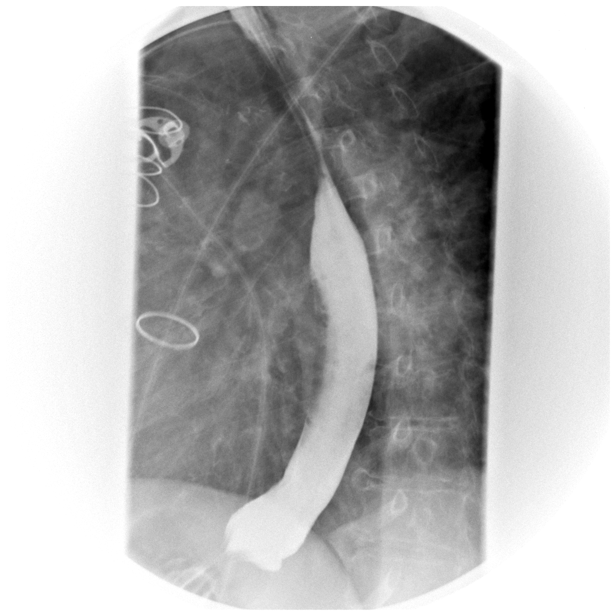

[14 of 19 positions shown; findings below may reference images not displayed]

FINDINGS: The pharynx appears normal.  No aspiration or
penetration occurred.  The esophagus demonstrates normal caliber
and mucosa throughout without stricture, mass or evidence of
inflammatory change.  There is no hiatal hernia.  Esophageal
motility is unremarkable.  No gastroesophageal reflux was elicited
on the study.  A 13 mm barium tablet transiently lodged at the
gastroesophageal junction but passed into the stomach with a
swallow of barium.
IMPRESSION: 13 mm barium tablet transiently lodged at the gastroesophageal
junction but passed into the stomach with a swallow of barium.  The
patient did not report any discomfort.  The study is otherwise
negative.

## 2013-11-05 ENCOUNTER — Other Ambulatory Visit: Payer: Medicare Other

## 2013-11-05 ENCOUNTER — Other Ambulatory Visit: Payer: Self-pay | Admitting: Cardiovascular Disease

## 2013-11-05 ENCOUNTER — Other Ambulatory Visit (INDEPENDENT_AMBULATORY_CARE_PROVIDER_SITE_OTHER): Payer: Medicare Other

## 2013-11-05 DIAGNOSIS — I5032 Chronic diastolic (congestive) heart failure: Secondary | ICD-10-CM | POA: Diagnosis not present

## 2013-11-05 LAB — BASIC METABOLIC PANEL
BUN: 12 mg/dL (ref 6–23)
CO2: 28 mEq/L (ref 19–32)
Calcium: 9.9 mg/dL (ref 8.4–10.5)
Chloride: 103 mEq/L (ref 96–112)
Creatinine, Ser: 0.8 mg/dL (ref 0.4–1.2)
GFR: 81.63 mL/min (ref >60.00)
Glucose, Bld: 124 mg/dL — ABNORMAL HIGH (ref 70–99)
Potassium: 4.5 mEq/L (ref 3.5–5.1)
Sodium: 139 mEq/L (ref 135–145)

## 2013-11-05 LAB — CULTURE, BLOOD (ROUTINE X 2)
Culture: NO GROWTH
Culture: NO GROWTH

## 2013-11-06 NOTE — Telephone Encounter (Signed)
EF normal, bioprosthetic valve looks ok

## 2013-11-06 NOTE — Telephone Encounter (Signed)
She was inpatient in hospital and had echo 10/30/13. She would like a report of that echo.

## 2013-11-07 ENCOUNTER — Telehealth: Payer: Self-pay | Admitting: Cardiology

## 2013-11-07 DIAGNOSIS — R0600 Dyspnea, unspecified: Secondary | ICD-10-CM

## 2013-11-07 DIAGNOSIS — I5032 Chronic diastolic (congestive) heart failure: Secondary | ICD-10-CM

## 2013-11-07 DIAGNOSIS — I5189 Other ill-defined heart diseases: Secondary | ICD-10-CM

## 2013-11-07 DIAGNOSIS — I359 Nonrheumatic aortic valve disorder, unspecified: Secondary | ICD-10-CM

## 2013-11-07 NOTE — Telephone Encounter (Signed)
Recently hospitalized for  Up 5 lbs per weight. Drank a lot of water (16 oz x 6) due to lack of A/C at work. Have missed any diuretics though Torsemide not covered, now on lasix 60 bid (previously on 80 bid before hospitalization) Recommended taking additional 40 this evening after taking 60 earlier (100 mg total) and re-assess. Symptoms not limiting at this point. To call in AM for additional recommendations and f/u if extra lasix successful.

## 2013-11-07 NOTE — Telephone Encounter (Signed)
Can increase to Lasix 80 mg bid.  Would get BMET + BNP in 1 week.

## 2013-11-08 ENCOUNTER — Other Ambulatory Visit: Payer: Self-pay | Admitting: *Deleted

## 2013-11-08 DIAGNOSIS — I359 Nonrheumatic aortic valve disorder, unspecified: Secondary | ICD-10-CM

## 2013-11-08 DIAGNOSIS — I5032 Chronic diastolic (congestive) heart failure: Secondary | ICD-10-CM

## 2013-11-08 DIAGNOSIS — I5189 Other ill-defined heart diseases: Secondary | ICD-10-CM

## 2013-11-08 DIAGNOSIS — R0600 Dyspnea, unspecified: Secondary | ICD-10-CM

## 2013-11-08 MED ORDER — FUROSEMIDE 40 MG PO TABS: 80.0000 mg | ORAL_TABLET | Freq: Two times a day (BID) | ORAL | Status: DC

## 2013-11-08 MED ORDER — FUROSEMIDE 40 MG PO TABS
80.0000 mg | ORAL_TABLET | Freq: Two times a day (BID) | ORAL | Status: DC
Start: 2013-11-08 — End: 2013-11-08

## 2013-11-08 NOTE — Telephone Encounter (Signed)
Left message for pt to call.

## 2013-11-08 NOTE — Telephone Encounter (Signed)
Spoke with pt, aware of dr [employer] recommendations. Patient voiced understanding of med change.  She will go to the [address] office next week for lab work.

## 2013-11-09 NOTE — Telephone Encounter (Signed)
Pt.notified

## 2013-11-12 ENCOUNTER — Ambulatory Visit (INDEPENDENT_AMBULATORY_CARE_PROVIDER_SITE_OTHER): Payer: Medicare Other | Admitting: Pharmacist Clinician (PhC)/ Clinical Pharmacy Specialist

## 2013-11-12 DIAGNOSIS — Z7901 Long term (current) use of anticoagulants: Secondary | ICD-10-CM

## 2013-11-12 DIAGNOSIS — Z954 Presence of other heart-valve replacement: Secondary | ICD-10-CM

## 2013-11-12 DIAGNOSIS — Z952 Presence of prosthetic heart valve: Secondary | ICD-10-CM

## 2013-11-12 LAB — POCT INR: INR: 2.5

## 2013-11-14 ENCOUNTER — Other Ambulatory Visit: Payer: Self-pay | Admitting: *Deleted

## 2013-11-14 ENCOUNTER — Other Ambulatory Visit (INDEPENDENT_AMBULATORY_CARE_PROVIDER_SITE_OTHER): Payer: Medicare Other

## 2013-11-14 DIAGNOSIS — I359 Nonrheumatic aortic valve disorder, unspecified: Secondary | ICD-10-CM | POA: Diagnosis not present

## 2013-11-14 DIAGNOSIS — R0609 Other forms of dyspnea: Secondary | ICD-10-CM | POA: Diagnosis not present

## 2013-11-14 DIAGNOSIS — E876 Hypokalemia: Secondary | ICD-10-CM

## 2013-11-14 DIAGNOSIS — R0600 Dyspnea, unspecified: Secondary | ICD-10-CM

## 2013-11-14 DIAGNOSIS — I5189 Other ill-defined heart diseases: Secondary | ICD-10-CM

## 2013-11-14 DIAGNOSIS — R0989 Other specified symptoms and signs involving the circulatory and respiratory systems: Secondary | ICD-10-CM

## 2013-11-14 DIAGNOSIS — I5032 Chronic diastolic (congestive) heart failure: Secondary | ICD-10-CM

## 2013-11-14 DIAGNOSIS — I519 Heart disease, unspecified: Secondary | ICD-10-CM | POA: Diagnosis not present

## 2013-11-14 LAB — BASIC METABOLIC PANEL
BUN: 14 mg/dL (ref 6–23)
CO2: 29 mEq/L (ref 19–32)
Calcium: 9.7 mg/dL (ref 8.4–10.5)
Chloride: 100 mEq/L (ref 96–112)
Creatinine, Ser: 0.8 mg/dL (ref 0.4–1.2)
GFR: 72.62 mL/min (ref >60.00)
Glucose, Bld: 174 mg/dL — ABNORMAL HIGH (ref 70–99)
Potassium: 3.1 mEq/L — ABNORMAL LOW (ref 3.5–5.1)
Sodium: 142 mEq/L (ref 135–145)

## 2013-11-14 LAB — BRAIN NATRIURETIC PEPTIDE: Pro B Natriuretic peptide (BNP): 53 pg/mL (ref 0.0–100.0)

## 2013-11-14 MED ORDER — POTASSIUM CHLORIDE CRYS ER 20 MEQ PO TBCR: EXTENDED_RELEASE_TABLET | ORAL | Status: DC

## 2013-11-15 ENCOUNTER — Other Ambulatory Visit: Payer: Medicare Other

## 2013-11-19 ENCOUNTER — Ambulatory Visit: Payer: Medicare Other | Admitting: Physician Assistant

## 2013-11-19 ENCOUNTER — Encounter: Payer: Self-pay | Admitting: Physician Assistant

## 2013-11-19 ENCOUNTER — Ambulatory Visit (INDEPENDENT_AMBULATORY_CARE_PROVIDER_SITE_OTHER): Payer: Medicare Other | Admitting: Physician Assistant

## 2013-11-19 ENCOUNTER — Other Ambulatory Visit: Payer: Medicare Other

## 2013-11-19 VITALS — BP 117/61 | HR 72 | Ht 62.0 in | Wt 180.0 lb

## 2013-11-19 DIAGNOSIS — E785 Hyperlipidemia, unspecified: Secondary | ICD-10-CM

## 2013-11-19 DIAGNOSIS — I2510 Atherosclerotic heart disease of native coronary artery without angina pectoris: Secondary | ICD-10-CM

## 2013-11-19 DIAGNOSIS — R918 Other nonspecific abnormal finding of lung field: Secondary | ICD-10-CM

## 2013-11-19 DIAGNOSIS — R0602 Shortness of breath: Secondary | ICD-10-CM | POA: Diagnosis not present

## 2013-11-19 DIAGNOSIS — R0609 Other forms of dyspnea: Secondary | ICD-10-CM | POA: Diagnosis not present

## 2013-11-19 DIAGNOSIS — I359 Nonrheumatic aortic valve disorder, unspecified: Secondary | ICD-10-CM

## 2013-11-19 DIAGNOSIS — I5032 Chronic diastolic (congestive) heart failure: Secondary | ICD-10-CM | POA: Diagnosis not present

## 2013-11-19 DIAGNOSIS — R0600 Dyspnea, unspecified: Secondary | ICD-10-CM | POA: Insufficient documentation

## 2013-11-19 DIAGNOSIS — R0989 Other specified symptoms and signs involving the circulatory and respiratory systems: Secondary | ICD-10-CM

## 2013-11-19 LAB — BASIC METABOLIC PANEL
BUN: 16 mg/dL (ref 6–23)
CO2: 28 mEq/L (ref 19–32)
Calcium: 9.6 mg/dL (ref 8.4–10.5)
Chloride: 97 mEq/L (ref 96–112)
Creatinine, Ser: 0.9 mg/dL (ref 0.4–1.2)
GFR: 70.65 mL/min (ref >60.00)
Glucose, Bld: 145 mg/dL — ABNORMAL HIGH (ref 70–99)
Potassium: 3.2 mEq/L — ABNORMAL LOW (ref 3.5–5.1)
Sodium: 140 mEq/L (ref 135–145)

## 2013-11-19 LAB — BRAIN NATRIURETIC PEPTIDE: Pro B Natriuretic peptide (BNP): 37 pg/mL (ref 0.0–100.0)

## 2013-11-19 NOTE — Patient Instructions (Signed)
Your physician recommends that you schedule a follow-up appointment in: [place]  Your physician recommends that you return for lab work in: TODAY (BMET, BNP)  Your physician recommends that you continue on your current medications as directed. Please refer to the Current Medication list given to you today.

## 2013-11-19 NOTE — Assessment & Plan Note (Signed)
No evidence of heart failure on exam. Patient remains short of breath with exertion. Check BMET and BNP. Check Lexi scan Myoview to rule out ischemia. Continue current dose of diuretics for now. May be able to lower.

## 2013-11-19 NOTE — Assessment & Plan Note (Signed)
Patient continues to have dyspnea on exertion after recent hospitalization for respiratory failure. There is no evidence of heart failure on exam today and her weight is down 8 pounds since the hospital. Given her history of CAD border a Lexi scan Myoview to rule out ischemic component. Patient is also quite depressed and this may be contributing to her symptoms.

## 2013-11-19 NOTE — Assessment & Plan Note (Signed)
Mild AS on 2-D echo

## 2013-11-19 NOTE — Assessment & Plan Note (Signed)
Patient have multiple pulmonary nodules on CT scan. Recommend repeat in 3 months.

## 2013-11-19 NOTE — Progress Notes (Signed)
HPI: This is a 68 year old female patient of Dr.McLean who is here for post hospital followup after an admission with acute respiratory failure with hypoxia and acute diastolic heart failure. It was felt that she had pneumonia. She was also found to have multiple pulmonary nodules on CT the need repeated CT in 3 months. 2-D echo showed normal LV function EF 60-65% with mild aortic stenosis and trivial AI, left ventricular diastolic function parameters were normal.  Patient has history of St. Jude mechanical aVR in 10/1998 and CABG x1  With a RIMA to the RCA. Myoview in 2013 showed no ischemia or infarction.  Since discharge from the hospital the patient complains of shortness of breath with very little activity. 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 He denies any chest pain, palpitations, dizziness or presyncope. She also is very tearful and depressed. She also continues to have sharp shooting pains in her right upper back when she coughs.  Allergies -- Atorvastatin    --  Muscle pain  -- Simvastatin -- Other (See Comments)   --  Muscle pain  -- Sulfa Antibiotics   -- Iodinated Diagnostic Agents -- Rash   --   Red rash after cardiac cath 1 wk ago, ? Contrast            allergy, requires 13 hr prep now per            [email or web address]  Current Outpatient Prescriptions on File Prior to Visit: Ascorbic Acid (VITAMIN C) 500 MG tablet, Take 500 mg by mouth 2 (two) times daily. , Disp: , Rfl:  calcium-vitamin D (CALCIUM 500+D) 500-200 MG-UNIT per tablet, Take 1 tablet by mouth 2 (two) times daily. , Disp: , Rfl:  ferrous sulfate 325 (65 FE) MG tablet, Take 1 tablet (325 mg total) by mouth daily., Disp: 30 tablet, Rfl: 0 folic acid (FOLVITE) 1 MG tablet, Take 1 tablet (1 mg total) by mouth daily., Disp: 90 tablet, Rfl: 0 furosemide (LASIX) 40 MG tablet, Take 2 tablets (80 mg total) by mouth 2 (two) times daily.,  Disp: 60 tablet, Rfl: 12 GuaiFENesin (MUCINEX PO), Take 1 tablet by mouth daily., Disp: , Rfl:  latanoprost (XALATAN) 0.005 % ophthalmic solution, Place 1 drop into both eyes at bedtime., Disp: , Rfl:  metFORMIN (GLUCOPHAGE) 500 MG tablet, Take 1,000 mg by mouth 2 (two) times daily with a meal., Disp: , Rfl:  metoprolol succinate (TOPROL-XL) 50 MG 24 hr tablet, Take 1 tablet (50 mg total) by mouth 2 (two) times daily., Disp: 60 tablet, Rfl: 6 pantoprazole (PROTONIX) 40 MG tablet, Take 40 mg by mouth 2 (two) times daily as needed., Disp: , Rfl:  simvastatin (ZOCOR) 40 MG tablet, Take 40 mg by mouth daily., Disp: , Rfl:  traZODone (DESYREL) 50 MG tablet, Take 0.5-1 tablets (25-50 mg total) by mouth at bedtime as needed for sleep., Disp: 30 tablet, Rfl: 3 warfarin (COUMADIN) 1 MG tablet, Take 1 tablet (1 mg total) by mouth one time only at 6 PM., Disp: , Rfl:  warfarin (COUMADIN) 3 MG tablet, TAKE 1 TABLET BY MOUTH EVERY DAY AS DIRECTED, Disp: 90 tablet, Rfl: 0  No current facility-administered medications on file prior to visit.   Past Medical History:   Coronary atherosclerosis of native coronary ar*              Hyperlipidemia  Chronic angle-closure glaucoma(365.23)                       Aortic valve disorders                                       Peripheral vascular disease                                  Type II diabetes mellitus                                    Depression                                                   PONV (postoperative nausea and vomiting)                       Comment:N&V   CHF (congestive heart failure)                                 Comment:diastolic heart failure   GERD (gastroesophageal reflux disease)                       Neuromuscular disorder                                         Comment:neuropathy   Arthritis                                                      Comment:osteo   Anemia                                                        Syncope                                         03/29/2012      Comment:carotid doppler - R ICA 50-69% reduction by               velocities (low end); L ICA 0-49% reduction by               velocities (low end); R and L subclavian               arteries - <50% redcution; R and L vertebral               arteries show normal antegrade flow   Dizziness  02/09/2012      Comment:Holter monitor - sinus rhythm, no ventricular               ectopic beats, no sulpraventricular ectopic               beats noted   OSA (obstructive sleep apnea)                               Past Surgical History:   [place]           Comment:bilaterally   CHOLECYSTECTOMY                                  1990         PERIPHERAL ARTERIAL STENT GRAFT                  09/20/11        Comment:right SFA   CARDIAC VALVE REPLACEMENT                        2000           Comment:aortic valve    LACERATION REPAIR                                               Comment:right hand   NEUROPLASTY / TRANSPOSITION ULNAR NERVE AT [facility]*                 Comment:right   [place]           Comment:Fibroids   [place]; 1971   BREAST BIOPSY                                    2012           Comment:left   LYMPH NODE BIOPSY                                06/2011         Comment:"core needle on 5"   CATARACT EXTRACTION W/ INTRAOCULAR LENS  IMPLA*  ~  2007       REFRACTIVE SURGERY                               ` 2004         Comment:"for glaucoma"   needle biopsy                                    2009           Comment:"on ankles for nerve damage"   ESOPHAGOGASTRODUODENOSCOPY                      N/A 10/27/2012        Comment:Procedure: ESOPHAGOGASTRODUODENOSCOPY (EGD);                Surgeon: Beryle Beams, MD;  Location: [facility]               ENDOSCOPY;  Service: Endoscopy;  Laterality:               N/A;   COLONOSCOPY                                     N/A 10/27/2012       Comment:Procedure: COLONOSCOPY;  Surgeon: Beryle Beams, MD;  Location: [facility] END[facility]COPY;  Service:               Endoscopy;  Laterality: N/A;   DOPPLER ECHOCARDIOGRAPHY                         03/29/2012      Comment:EF >55%; mild concentric LVH; stage 1 diastolic              dysfunction, elevated LV filling pressure,               dilated LA; MAC mild MR; St Jude AVR peak and               mean gradients of 75mmHg and 79mmHg;               transvalvular gradients have increased (prev 23              and 14 respectively)   CARDIOVASCULAR STRESS TEST                       08/25/2011       Comment:R/L MV - EF 72%; no scintigraphic evidence of               inducible MI; normal perfusionTID of 1.25               elevated - could indicate small vessle               subendocardial ischemia; EKG NSR at 66, non               diagnostic for ischemia   CARDIAC CATHETERIZATION                          01/14/1999      Comment:normal LV function, severe aortic stenosis; 80%  and 70% stenosis in RCA; mild 20% distal               norrowing in L main with 20% proximal LAD               stenosis, 40% diagonal stenosis and 20%               proximal circumflex stenosis   FEMORAL ARTERY STENT                             09/20/2011      Comment:6 x 40 Smart Nitinol self-expanding stent               placed;  10/15/2031 -R SFA stent open and patent              w/o evidence of restenosis   CORONARY ARTERY BYPASS GRAFT                     2000           Comment:RIMA to RCA  Review of patient's family history indicates:   Cancer                         Mother                     Comment: Colon   COPD                            Mother                   Emphysema                      Mother                   Diabetes                       Neg Hx                   Cancer                         Maternal Grandmother     Cancer                         Maternal Grandfather     Heart disease                  Brother                  Social History   Marital Status: Married             Spouse Name:                      Years of Education:                 Number of children:             Occupational History   None on file  Social History Main Topics   Smoking Status: Never Smoker                     Smokeless Status: Never Used  Alcohol Use: No             Drug Use: No             Sexual Activity: No                 Other Topics            Concern   None on file  Social History Narrative   Pt is a high school graduate with 2 years of college. Married in 1967 she has 1 son born 50 and 1 daughter born 63 and 1 grandchild. 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    ROS: see history of present illness otherwise negative    PHYSICAL EXAM: Well-nournished, in no acute distress. Neck: Right carotid bruit, No JVD, HJR, or thyroid enlargement  Lungs:  decreased breath sounds but No tachypnea, clear without wheezing, rales, or rhonchi  Cardiovascular: RRR, PMI not displaced, crisp valvular clicks, 2/6 systolic murmur at the left sternal border, no  gallops, bruit, thrill, or heave.  Abdomen: BS normal. Soft without organomegaly, masses, lesions or tenderness.  Extremities: without cyanosis, clubbing or edema. Good distal pulses bilateral  SKin: Warm, no lesions or rashes   Musculoskeletal: No deformities  Neuro: no focal signs  BP 117/61  Pulse 72  Ht 5\' 2"  (1.575 m)  Wt 180 lb (81.647 kg)  BMI 32.91 kg/m2, weight is down 8 pounds from the hospital    EKG: Normal sinus rhythm with nonspecific ST-T wave changes   2-D echo 10/29/13  Study  Conclusions  - Left ventricle: The cavity size was normal. Wall thickness was   increased in a pattern of mild LVH. Systolic function was normal.   The estimated ejection fraction was in the range of 60% to 65%.   Wall motion was normal; there were no regional wall motion   abnormalities. Left ventricular diastolic function parameters   were normal. - Aortic valve: A bioprosthesis was present. There was mild   stenosis. There was trivial regurgitation. - Mitral valve: Calcified annulus. There was mild regurgitation. - Left atrium: The atrium was mildly dilated.  IMPRESSION: Small bilateral pleural effusions greater on the right. Mild congestive heart suspected.  Right lower lobe consolidation may represent result of an infectious process. This will need to be followup until complete clearance to exclude underlying mass.  Scattered bilateral pulmonary nodules. Some are stable whereas others are new or enlarged as detailed above.  Recommend treating any clinically suspected infectious infiltrate and/or pulmonary edema with follow-up chest CT within the next 3 months for further delineation of these findings.  Post aortic valve replacement. Mildly dilated ascending thoracic aorta measuring up to 3.7 cm minimally changed from the prior exam.  Prominent coronary artery calcifications.  Limited imaging of upper abdomen reveals minimally nodular contour or of the anterior aspect of the left lobe of the liver without other findings to suggest cirrhosis.   Electronically Signed   By: Chauncey Cruel M.D.   On: 10/29/2013 18:52

## 2013-11-20 ENCOUNTER — Other Ambulatory Visit: Payer: Self-pay | Admitting: Cardiology

## 2013-11-21 ENCOUNTER — Telehealth: Payer: Self-pay | Admitting: *Deleted

## 2013-11-21 ENCOUNTER — Other Ambulatory Visit: Payer: Self-pay | Admitting: *Deleted

## 2013-11-21 MED ORDER — POTASSIUM CHLORIDE CRYS ER 20 MEQ PO TBCR: 40.0000 meq | EXTENDED_RELEASE_TABLET | Freq: Three times a day (TID) | ORAL | Status: DC

## 2013-11-21 NOTE — Telephone Encounter (Signed)
lmtcb for lab results

## 2013-11-21 NOTE — Progress Notes (Signed)
Per lab results pt K+ was changed to 40 meq three times a day. Pt is aware of changes and agrees

## 2013-11-26 ENCOUNTER — Ambulatory Visit (HOSPITAL_COMMUNITY): Payer: Medicare Other | Attending: Cardiovascular Disease | Admitting: Radiology

## 2013-11-26 VITALS — BP 114/53 | Ht 62.0 in | Wt 182.0 lb

## 2013-11-26 DIAGNOSIS — R5381 Other malaise: Secondary | ICD-10-CM | POA: Insufficient documentation

## 2013-11-26 DIAGNOSIS — R0609 Other forms of dyspnea: Secondary | ICD-10-CM | POA: Insufficient documentation

## 2013-11-26 DIAGNOSIS — I2510 Atherosclerotic heart disease of native coronary artery without angina pectoris: Secondary | ICD-10-CM | POA: Diagnosis not present

## 2013-11-26 DIAGNOSIS — R002 Palpitations: Secondary | ICD-10-CM | POA: Insufficient documentation

## 2013-11-26 DIAGNOSIS — R079 Chest pain, unspecified: Secondary | ICD-10-CM | POA: Diagnosis not present

## 2013-11-26 DIAGNOSIS — R5383 Other fatigue: Secondary | ICD-10-CM | POA: Insufficient documentation

## 2013-11-26 DIAGNOSIS — R0602 Shortness of breath: Secondary | ICD-10-CM | POA: Diagnosis not present

## 2013-11-26 DIAGNOSIS — R0989 Other specified symptoms and signs involving the circulatory and respiratory systems: Secondary | ICD-10-CM | POA: Insufficient documentation

## 2013-11-26 DIAGNOSIS — R110 Nausea: Secondary | ICD-10-CM

## 2013-11-26 MED ORDER — TECHNETIUM TC 99M SESTAMIBI GENERIC - CARDIOLITE
11.0000 | Freq: Once | INTRAVENOUS | Status: AC | PRN
  Administered 2013-11-26: 11 via INTRAVENOUS

## 2013-11-26 MED ORDER — REGADENOSON 0.4 MG/5ML IV SOLN
0.4000 mg | Freq: Once | INTRAVENOUS | Status: AC
  Administered 2013-11-26: 0.4 mg via INTRAVENOUS

## 2013-11-26 MED ORDER — AMINOPHYLLINE 25 MG/ML IV SOLN
75.0000 mg | Freq: Two times a day (BID) | INTRAVENOUS | Status: DC | PRN
  Administered 2013-11-26: 75 mg via INTRAVENOUS

## 2013-11-26 MED ORDER — TECHNETIUM TC 99M SESTAMIBI GENERIC - CARDIOLITE
33.0000 | Freq: Once | INTRAVENOUS | Status: AC | PRN
  Administered 2013-11-26: 33 via INTRAVENOUS

## 2013-11-26 NOTE — Progress Notes (Addendum)
[facility] [address] [place], [place] [address] [phone]    Cardiology Nuclear Med Study  Abigail Oliver is a 68 y.o. female     MRN : [id number]     DOB: Sep 23, 1945  Procedure Date: 11/26/2013  Nuclear Med Background Indication for Stress Test:  Evaluation for Ischemia, Graft Patency and Post Hospital:10/29/13 Acute Respiratory Failure History:  CAD-CATH-CABG x1 AVR 2000 08/25/11 MPI: EF: 72%  10/30/13 ECHO: EF: 60-65% Cardiac Risk Factors: Lipids, NIDDM and PVD  Symptoms:  Chest Pain, DOE, Fatigue, Palpitations and SOB   Nuclear Pre-Procedure Caffeine/Decaff Intake:  None NPO After: 7:00pm   Lungs:  clear O2 Sat: 95% on room air. IV 0.9% NS with Angio Cath:  22g  IV Site: R Hand  IV Started by:  Matilde Haymaker, RN  Chest Size (in):  40 Cup Size: DD  Height: 5\' 2"  (1.575 m)  Weight:  182 lb (82.555 kg)  BMI:  Body mass index is 33.28 kg/(m^2). Tech Comments:  No Toprol x 11 hrs. Aminophylline 150 mg IV for symptoms. All were resolved before leaving.    Nuclear Med Study 1 or 2 day study: 1 day  Stress Test Type:  Lexiscan  Reading MD: n/a  Order Authorizing Provider:  Theador Hawthorne  Resting Radionuclide: Technetium 6m Sestamibi  Resting Radionuclide Dose: 11.0 mCi   Stress Radionuclide:  Technetium 38m Sestamibi  Stress Radionuclide Dose: 33.0 mCi           Stress Protocol Rest HR: 59 Stress HR: 82  Rest BP: 114/53 Stress BP: 118/53  Exercise Time (min): n/a METS: n/a   Predicted Max HR: 152 bpm % Max HR: 53.95 bpm Rate Pressure Product: 9676   Dose of Adenosine (mg):  n/a Dose of Lexiscan: 0.4 mg  Dose of Atropine (mg): n/a Dose of Dobutamine: n/a mcg/kg/min (at max HR)  Stress Test Technologist: Perrin Maltese, EMT-P  Nuclear Technologist:  Annye Rusk, CNMT     Rest Procedure:  Myocardial perfusion imaging was performed at rest 45 minutes following the intravenous administration of Technetium 32m Sestamibi. Rest  ECG: NSR - Normal EKG  Stress Procedure:  The patient received IV Lexiscan 0.4 mg over 15-seconds.  Technetium 26m Sestamibi injected at 30-seconds. This patient had a headache, nausea, sob, chest pressure, and dizziness with the Lexiscan injection. Quantitative spect images were obtained after a 45 minute delay. Stress ECG: No significant change from baseline ECG  QPS Raw Data Images:  Normal; no motion artifact; normal heart/lung ratio. Stress Images:  There is decreased uptake in the lateral wall. Rest Images:  Normal homogeneous uptake in all areas of the myocardium. Subtraction (SDS):  These findings are consistent with ischemia. Transient Ischemic Dilatation (Normal <1.22):  0.92 Lung/Heart Ratio (Normal <0.45):  0.31  Quantitative Gated Spect Images QGS EDV:  79 ml QGS ESV:  25 ml  Impression Exercise Capacity:  Lexiscan with low level exercise. BP Response:  Normal blood pressure response. Clinical Symptoms:  Dizzy, headache chest pain Aminophylline 75 mg given ECG Impression:  No significant ST segment change suggestive of ischemia. Comparison with Prior Nuclear Study: No images to compare  Overall Impression:  Low risk stress nuclear study Small area of inferolateral wall ischemia at the base no infarct.  LV Ejection Fraction: 69%.  LV Wall Motion:  NL LV Function; NL Wall Motion  [employer]  Small area of inferolateral ischemia.  Overall low risk.  If not having significant chest pain,  probably would not take for cardiac cath.  Please report to patient.   Loralie Champagne 11/27/2013

## 2013-11-28 ENCOUNTER — Other Ambulatory Visit (INDEPENDENT_AMBULATORY_CARE_PROVIDER_SITE_OTHER): Payer: Medicare Other

## 2013-11-28 ENCOUNTER — Other Ambulatory Visit: Payer: Medicare Other

## 2013-11-28 DIAGNOSIS — E876 Hypokalemia: Secondary | ICD-10-CM | POA: Diagnosis not present

## 2013-11-28 LAB — BASIC METABOLIC PANEL
BUN: 18 mg/dL (ref 6–23)
CO2: 28 mEq/L (ref 19–32)
Calcium: 10.6 mg/dL — ABNORMAL HIGH (ref 8.4–10.5)
Chloride: 99 mEq/L (ref 96–112)
Creatinine, Ser: 0.8 mg/dL (ref 0.4–1.2)
GFR: 76.87 mL/min (ref >60.00)
Glucose, Bld: 120 mg/dL — ABNORMAL HIGH (ref 70–99)
Potassium: 4.4 mEq/L (ref 3.5–5.1)
Sodium: 138 mEq/L (ref 135–145)

## 2013-11-29 NOTE — Progress Notes (Signed)
Pt IS  COMPLAINING  WITH  FATIGUE   STATES  THIS  NOT  LIKE  HER  ALSO   NOTES   BACK  PAIN   RIGHT  BEHIND HEART  WILL FORWARD TO  DR [facility]  FOR  REVIEW   HAS  F/U WITH   DR [facility]   ON  01-23-14 AT  3:00 PM NOT  SURE IF   THIS  IS  QUICK ENOUGH .Adonis Housekeeper

## 2013-11-30 NOTE — Progress Notes (Signed)
Called pt per Dr. Claris Gladden advice.  States she just doesn't feel good.  Is SOB, pain is in back when she takes a deep breath.  Denies CP. States she had bronchitis recently and doesn't know if pain is coming from lungs.  States she is light headed and dizzy at times. Advised will forward to Dr. Claris Gladden nurse Eliot Ford to see when she can work her in to be seen in the next 2 wks.

## 2013-12-02 ENCOUNTER — Other Ambulatory Visit: Payer: Self-pay | Admitting: Internal Medicine

## 2013-12-03 ENCOUNTER — Ambulatory Visit (INDEPENDENT_AMBULATORY_CARE_PROVIDER_SITE_OTHER): Payer: Medicare Other | Admitting: Pharmacist

## 2013-12-03 DIAGNOSIS — Z7901 Long term (current) use of anticoagulants: Secondary | ICD-10-CM

## 2013-12-03 DIAGNOSIS — Z952 Presence of prosthetic heart valve: Secondary | ICD-10-CM

## 2013-12-03 DIAGNOSIS — Z954 Presence of other heart-valve replacement: Secondary | ICD-10-CM | POA: Diagnosis not present

## 2013-12-03 LAB — POCT INR: INR: 2.3

## 2013-12-03 NOTE — Progress Notes (Signed)
Pt advised I have made her an appt with Dr [name] 12/11/13

## 2013-12-04 ENCOUNTER — Encounter: Payer: Self-pay | Admitting: *Deleted

## 2013-12-04 ENCOUNTER — Other Ambulatory Visit: Payer: Self-pay | Admitting: *Deleted

## 2013-12-05 ENCOUNTER — Encounter: Payer: Self-pay | Admitting: Cardiology

## 2013-12-05 ENCOUNTER — Other Ambulatory Visit: Payer: Self-pay | Admitting: Cardiology

## 2013-12-05 ENCOUNTER — Ambulatory Visit (INDEPENDENT_AMBULATORY_CARE_PROVIDER_SITE_OTHER): Payer: Medicare Other | Admitting: Cardiology

## 2013-12-05 ENCOUNTER — Encounter: Payer: Self-pay | Admitting: *Deleted

## 2013-12-05 VITALS — BP 130/76 | HR 66 | Ht 62.0 in | Wt 187.0 lb

## 2013-12-05 DIAGNOSIS — R918 Other nonspecific abnormal finding of lung field: Secondary | ICD-10-CM

## 2013-12-05 DIAGNOSIS — R0989 Other specified symptoms and signs involving the circulatory and respiratory systems: Secondary | ICD-10-CM

## 2013-12-05 DIAGNOSIS — I359 Nonrheumatic aortic valve disorder, unspecified: Secondary | ICD-10-CM

## 2013-12-05 DIAGNOSIS — R0609 Other forms of dyspnea: Secondary | ICD-10-CM

## 2013-12-05 DIAGNOSIS — I2510 Atherosclerotic heart disease of native coronary artery without angina pectoris: Secondary | ICD-10-CM | POA: Diagnosis not present

## 2013-12-05 DIAGNOSIS — I5189 Other ill-defined heart diseases: Secondary | ICD-10-CM

## 2013-12-05 DIAGNOSIS — R0600 Dyspnea, unspecified: Secondary | ICD-10-CM

## 2013-12-05 DIAGNOSIS — I739 Peripheral vascular disease, unspecified: Secondary | ICD-10-CM

## 2013-12-05 DIAGNOSIS — I25810 Atherosclerosis of coronary artery bypass graft(s) without angina pectoris: Secondary | ICD-10-CM | POA: Diagnosis not present

## 2013-12-05 DIAGNOSIS — I519 Heart disease, unspecified: Secondary | ICD-10-CM

## 2013-12-05 DIAGNOSIS — I5032 Chronic diastolic (congestive) heart failure: Secondary | ICD-10-CM

## 2013-12-05 MED ORDER — TORSEMIDE 20 MG PO TABS: 20.0000 mg | ORAL_TABLET | Freq: Two times a day (BID) | ORAL | Status: DC

## 2013-12-05 MED ORDER — PREDNISONE 20 MG PO TABS: ORAL_TABLET | ORAL | Status: DC

## 2013-12-05 MED ORDER — TORSEMIDE 20 MG PO TABS: ORAL_TABLET | ORAL | Status: DC

## 2013-12-05 NOTE — Patient Instructions (Addendum)
Stop lasix (furosemide).  Start torsemide 40mg  two times a day. This will be 2 of your 20mg  tablets two times a day.  Your physician recommends that you return for lab work on Friday July 17---BMET/CBCd  You have an appointment on Friday July 17,2015 at 11:30AM in the Coumadin Clinic here at the [facility].   Your physician has requested that you have a carotid duplex. This test is an ultrasound of the carotid arteries in your neck. It looks at blood flow through these arteries that supply the brain with blood. Allow one hour for this exam. There are no restrictions or special instructions.  Your physician has requested that you have a lower extremity arterial duplex. This test is an ultrasound of the arteries in the legs. It looks at arterial blood flow in the legs. Allow one hour for Lower Arterial scans. There are no restrictions or special instructions  Your physician has requested that you have a cardiac catheterization. Cardiac catheterization is used to diagnose and/or treat various heart conditions. Doctors may recommend this procedure for a number of different reasons. The most common reason is to evaluate chest pain. Chest pain can be a symptom of coronary artery disease (CAD), and cardiac catheterization can show whether plaque is narrowing or blocking your heart's arteries. This procedure is also used to evaluate the valves, as well as measure the blood flow and oxygen levels in different parts of your heart. For further information please visit [facility]. Please follow instruction sheet, as given. Friday July 24,2015  TAKE PREDNISONE BEFORE THE PROCEDURE--SEE INSTRUCTIONS--this will be 60mg  12 hours before the procedure and 60mg  2 hours before the procedure.   Your physician recommends that you schedule a follow-up appointment in: 2 weeks with Dr [name].

## 2013-12-06 ENCOUNTER — Encounter (HOSPITAL_COMMUNITY): Payer: Medicare Other

## 2013-12-06 ENCOUNTER — Other Ambulatory Visit (HOSPITAL_COMMUNITY): Payer: Self-pay | Admitting: Cardiology

## 2013-12-06 ENCOUNTER — Encounter (HOSPITAL_COMMUNITY): Payer: Self-pay | Admitting: Pharmacy Technician

## 2013-12-06 DIAGNOSIS — I739 Peripheral vascular disease, unspecified: Secondary | ICD-10-CM

## 2013-12-06 DIAGNOSIS — I6529 Occlusion and stenosis of unspecified carotid artery: Secondary | ICD-10-CM

## 2013-12-06 NOTE — Progress Notes (Signed)
Patient ID: Abigail Oliver, female   DOB: Oct 19, 1945, 68 y.o.   MRN: [id number] PCP: Dr. Linda Hedges  68 yo with history of aortic stenosis s/p St Jude mechanical AVR (6/00) and CAD s/p single vessel CABG (6/00) presents for cardiology evaluation.  She had severe aortic stenosis and therefore required CABG-AVR in 6/00.  She had a RIMA-RCA. She had a myoview in 4/13 with no ischemia or infarction, and echo in 6/14 showed normal EF with moderate diastolic dysfunction and a well-seated mechanical valve.    In 6/15, torsemide was stopped and she was begun on Lasix due to increased cost of torsemide.  She developed dyspnea and lower extremity edema.  She was admitted later that month with community-acquired PNA as well as CHF.  CT showed RLL PNA and multiple pulmonary nodules.  Echo in 6/15 showed EF 60-65%, mechanical aortic valve with mean gradient 27 mmHg.   After discharge, she continued to be short of breath.  Lasix was increased to 80 mg bid.  This still has note helped.  Weight is up 7 lbs and she is not urinating nearly as much as she did on torsemide.  She has orthopnea and has to prop up the head of her bed.  +Bendopnea.  She is short of breath walking around in her yard.  No chest pain.  Lexiscan Cardiolite was done in 7/15 given the exertional symptoms and showed a small area of basal inferolateral ischemia.   Labs (6/14): BNP 216, HCT 32, K 4.8, creatinine 0.8 Labs (8/14): K 4.4, creatinine 0.8, BNP 55, HDL 45, LDL 141 Labs (10/14): LDL 61, HDL 43, AST 40, ALT 31 Labs (6/15): BNP 37 Labs (7/15): K 4.4, creatinine 0.8  PMH: 1. Aortic stenosis: Now has St Jude mechanical aortic valve (6/00).  Echo (6/14) with EF 60-65%, mild LVH, moderate diastolic dysfunction, normal RV, mechanical aortic valve with mean gradient 20 mmHg.  Echo (6/15) with EF 60-65%, mechanical aortic valve with mean gradient 27 mmHg.  2. CAD: CABG at time of AVR in 6/00 with RIMA-RCA.  Myoview in 4/13 with EF 72%, no ischemia or  infarction. Lexiscan Cardiolite (7/15) with EF 69%, small area of basal inferolateral ischemia.  3. HTN 4. Type II diabetes 5. GERD 6. OSA 7. Hyperlipidemia 8. PAD: Right SFA stent.  9. Colon polyps 10. Carotid stenosis: Carotid dopplers (11/13) witih 50-69% RICA.  11. Diastolic CHF 12. Gastritis 13. Dizziness: Holter (8/14) with few PACs, otherwise unremarkable.  14. Mild transaminase elevation 15. Pulmonary nodules: Noted by CT in 6/15.  16. Contrast allergy  SH: Married, works as Theme park manager, nonsmoker.   FH: CAD  ROS: All systems reviewed and negative except as per HPI.   Current Outpatient Prescriptions  Medication Sig Dispense Refill  . Ascorbic Acid (VITAMIN C) 500 MG tablet Take 500 mg by mouth 2 (two) times daily.       . calcium-vitamin D (CALCIUM 500+D) 500-200 MG-UNIT per tablet Take 1 tablet by mouth 2 (two) times daily.       . ferrous sulfate 325 (65 FE) MG tablet Take 1 tablet (325 mg total) by mouth daily.  30 tablet  0  . folic acid (FOLVITE) 1 MG tablet TAKE 1 TABLET BY MOUTH DAILY  90 tablet  0  . gabapentin (NEURONTIN) 300 MG capsule Take 300 mg by mouth as needed.      . latanoprost (XALATAN) 0.005 % ophthalmic solution Place 1 drop into both eyes at bedtime.      Marland Kitchen  metFORMIN (GLUCOPHAGE) 500 MG tablet Take 1,000 mg by mouth 2 (two) times daily with a meal.      . metoprolol succinate (TOPROL-XL) 50 MG 24 hr tablet TAKE 1 TABLET BY MOUTH TWICE DAILY  60 tablet  1  . pantoprazole (PROTONIX) 40 MG tablet Take 40 mg by mouth 2 (two) times daily as needed.      Marland Kitchen POTASSIUM CHLORIDE PO Take 60 mg by mouth 2 (two) times daily. 40mg  tablets : 1.5 tablets in the am, 1.5 tablets in the pm      . simvastatin (ZOCOR) 40 MG tablet Take 40 mg by mouth daily.      . traZODone (DESYREL) 50 MG tablet Take 0.5-1 tablets (25-50 mg total) by mouth at bedtime as needed for sleep.  30 tablet  3  . warfarin (COUMADIN) 1 MG tablet Take 1 tablet (1 mg total) by mouth one time only  at 6 PM.      . warfarin (COUMADIN) 3 MG tablet TAKE 1 TABLET BY MOUTH EVERY DAY AS DIRECTED  90 tablet  0  . predniSONE (DELTASONE) 20 MG tablet Take 3 tablets (60mg ) 12 hours before the procedure and 3 tablets (60mg ) 2 hours before the procedure  6 tablet  0  . torsemide (DEMADEX) 20 MG tablet 2 tablets (40mg ) two times a day  120 tablet  3   No current facility-administered medications for this visit.    BP 130/76  Pulse 66  Ht 5\' 2"  (1.575 m)  Wt 187 lb (84.823 kg)  BMI 34.19 kg/m2  SpO2 96% General: NAD Neck: JVP 9-10 cm, no thyromegaly or thyroid nodule.  Lungs: Clear to auscultation bilaterally with normal respiratory effort. CV: Nondisplaced PMI.  Heart regular S1/S2, mechanical S2, no S3/S4, 2/6 early SEM RUSB.  No ankle edema.  No carotid bruit.  Normal pedal pulses.  Abdomen: Soft, nontender, no hepatosplenomegaly, mild distention.  Neurologic: Alert and oriented x 3.  Psych: Normal affect. Extremities: No clubbing or cyanosis.   Assessment/Plan: 1. St Jude mechanical aortic valve:  Mildly elevated mean gradient for this valve on echo in 6/15.  Continue coumadin and add ASA 81 mg daily to her regimen.   2. Chronic diastolic CHF: Patient is volume overloaded on exam with NYHA class III symptoms.  This has been an issue ever since she switched off torsemide onto Lasix despite escalating Lasix dosing.   - I am going to have her stop Lasix and start torsemide 40 mg bid.  She will need a BMET in 1 week.  - I am going to arrange for right heart catheterization next week.   3. CAD: s/p RIMA-RCA at time of AVR.  No chest pain.  However, she has significant exertional dyspnea.  Lexiscan Cardiolite in 7/15 showed a small basal inferolateral reversible defect that was not seen on prior Cardiolite.  Given the abnormality on Cardiolite and her prominent exertional symptoms, I will arrange for LHC.  She will need premedication for contrast allergy as well as Lovenox bridging while off  coumadin.   4. Hyperlipidemia: Continue statin. 5. Carotid stenosis: I will arrange followup carotid dopplers.  6. PAD: Known PAD with right SFA stent.  I will arrange for peripheral arterial dopplers.  7. Pulmonary nodules: Repeat CT in 9/15.   Loralie Champagne 12/06/2013

## 2013-12-07 ENCOUNTER — Ambulatory Visit (INDEPENDENT_AMBULATORY_CARE_PROVIDER_SITE_OTHER): Payer: Medicare Other | Admitting: Pharmacist

## 2013-12-07 ENCOUNTER — Other Ambulatory Visit (INDEPENDENT_AMBULATORY_CARE_PROVIDER_SITE_OTHER): Payer: Medicare Other

## 2013-12-07 DIAGNOSIS — I5032 Chronic diastolic (congestive) heart failure: Secondary | ICD-10-CM | POA: Diagnosis not present

## 2013-12-07 DIAGNOSIS — Z954 Presence of other heart-valve replacement: Secondary | ICD-10-CM

## 2013-12-07 DIAGNOSIS — R0989 Other specified symptoms and signs involving the circulatory and respiratory systems: Secondary | ICD-10-CM | POA: Diagnosis not present

## 2013-12-07 DIAGNOSIS — Z5181 Encounter for therapeutic drug level monitoring: Secondary | ICD-10-CM

## 2013-12-07 DIAGNOSIS — Z7901 Long term (current) use of anticoagulants: Secondary | ICD-10-CM

## 2013-12-07 DIAGNOSIS — R0609 Other forms of dyspnea: Secondary | ICD-10-CM

## 2013-12-07 DIAGNOSIS — I359 Nonrheumatic aortic valve disorder, unspecified: Secondary | ICD-10-CM | POA: Diagnosis not present

## 2013-12-07 DIAGNOSIS — Z952 Presence of prosthetic heart valve: Secondary | ICD-10-CM

## 2013-12-07 LAB — CBC WITH DIFFERENTIAL/PLATELET
Basophils Absolute: 0 K/uL (ref 0.0–0.1)
Basophils Relative: 0.5 % (ref 0.0–3.0)
Eosinophils Absolute: 0.1 K/uL (ref 0.0–0.7)
Eosinophils Relative: 1.1 % (ref 0.0–5.0)
HCT: 34 % — ABNORMAL LOW (ref 36.0–46.0)
Hemoglobin: 11.2 g/dL — ABNORMAL LOW (ref 12.0–15.0)
Lymphocytes Relative: 31.8 % (ref 12.0–46.0)
Lymphs Abs: 2 K/uL (ref 0.7–4.0)
MCHC: 33 g/dL (ref 30.0–36.0)
MCV: 85.3 fl (ref 78.0–100.0)
Monocytes Absolute: 0.5 K/uL (ref 0.1–1.0)
Monocytes Relative: 7 % (ref 3.0–12.0)
Neutro Abs: 3.8 K/uL (ref 1.4–7.7)
Neutrophils Relative %: 59.6 % (ref 43.0–77.0)
Platelets: 176 K/uL (ref 150.0–400.0)
RBC: 3.98 Mil/uL (ref 3.87–5.11)
RDW: 17.5 % — ABNORMAL HIGH (ref 11.5–15.5)
WBC: 6.4 K/uL (ref 4.0–10.5)

## 2013-12-07 LAB — BASIC METABOLIC PANEL
BUN: 18 mg/dL (ref 6–23)
CO2: 29 mEq/L (ref 19–32)
Calcium: 10.3 mg/dL (ref 8.4–10.5)
Chloride: 100 mEq/L (ref 96–112)
Creatinine, Ser: 0.9 mg/dL (ref 0.4–1.2)
GFR: 65.29 mL/min (ref >60.00)
Glucose, Bld: 149 mg/dL — ABNORMAL HIGH (ref 70–99)
Potassium: 3.4 mEq/L — ABNORMAL LOW (ref 3.5–5.1)
Sodium: 140 mEq/L (ref 135–145)

## 2013-12-07 LAB — POCT INR: INR: 2.1

## 2013-12-07 MED ORDER — ENOXAPARIN SODIUM 80 MG/0.8ML ~~LOC~~ SOLN: 80.0000 mg | Freq: Two times a day (BID) | SUBCUTANEOUS | Status: DC

## 2013-12-07 NOTE — Progress Notes (Signed)
Quick Note:  Preliminary report reviewed by triage nurse and sent to MD desk. ______ 

## 2013-12-07 NOTE — Patient Instructions (Addendum)
12/07/13 - warfarin 1 and 1/2 tablet today 12/08/13 - warfarin 1 tablet (last dose today) 12/09/13 - No warfarin, No lovenox 12/10/13 - No warfarin, take lovenox 80 mg in 9 AM, and lovenox 80 mg in 9 PM 7/21 - No warfarin, take lovenox 80 mg in 9 AM, and lovenox 80 mg in 9 PM 7/22 - No warfarin, take lovenox 80 mg in 9 AM, and lovenox 80 mg in 9 PM 7/23 (day before cath) - No warfarin, take lovenox 80 mg at 9 AM only.  No lovenox that night. 7/24 (day of cath) - no lovenox this morning.  They will let you know when to restart lovenox and warfarin in the hospital.   Will likely need to restart lovenox at 80 mg twice daily on 12/15/13 and get back on previous warfarin dose - 1 tablet on all days except 1.5 tablets on Friday. Recheck INR here 12/19/13 Call [phone] if questions ([facility])

## 2013-12-10 ENCOUNTER — Ambulatory Visit (INDEPENDENT_AMBULATORY_CARE_PROVIDER_SITE_OTHER)
Admission: RE | Admit: 2013-12-10 | Discharge: 2013-12-10 | Disposition: A | Discharge status: Home / Self Care | Payer: Medicare Other | Source: Ambulatory Visit | Attending: Cardiology | Admitting: Cardiology

## 2013-12-10 ENCOUNTER — Telehealth: Payer: Self-pay | Admitting: *Deleted

## 2013-12-10 ENCOUNTER — Ambulatory Visit (HOSPITAL_BASED_OUTPATIENT_CLINIC_OR_DEPARTMENT_OTHER): Payer: Medicare Other | Admitting: Cardiology

## 2013-12-10 ENCOUNTER — Ambulatory Visit (HOSPITAL_COMMUNITY): Payer: Medicare Other | Attending: Cardiology | Admitting: Cardiology

## 2013-12-10 DIAGNOSIS — R059 Cough, unspecified: Secondary | ICD-10-CM | POA: Diagnosis not present

## 2013-12-10 DIAGNOSIS — I658 Occlusion and stenosis of other precerebral arteries: Secondary | ICD-10-CM | POA: Insufficient documentation

## 2013-12-10 DIAGNOSIS — Z951 Presence of aortocoronary bypass graft: Secondary | ICD-10-CM | POA: Diagnosis not present

## 2013-12-10 DIAGNOSIS — I10 Essential (primary) hypertension: Secondary | ICD-10-CM | POA: Diagnosis not present

## 2013-12-10 DIAGNOSIS — R05 Cough: Secondary | ICD-10-CM | POA: Diagnosis not present

## 2013-12-10 DIAGNOSIS — E785 Hyperlipidemia, unspecified: Secondary | ICD-10-CM | POA: Diagnosis not present

## 2013-12-10 DIAGNOSIS — I739 Peripheral vascular disease, unspecified: Secondary | ICD-10-CM | POA: Insufficient documentation

## 2013-12-10 DIAGNOSIS — I2510 Atherosclerotic heart disease of native coronary artery without angina pectoris: Secondary | ICD-10-CM | POA: Diagnosis not present

## 2013-12-10 DIAGNOSIS — J42 Unspecified chronic bronchitis: Secondary | ICD-10-CM | POA: Diagnosis not present

## 2013-12-10 DIAGNOSIS — I6529 Occlusion and stenosis of unspecified carotid artery: Secondary | ICD-10-CM | POA: Diagnosis not present

## 2013-12-10 DIAGNOSIS — E119 Type 2 diabetes mellitus without complications: Secondary | ICD-10-CM | POA: Insufficient documentation

## 2013-12-10 NOTE — Telephone Encounter (Signed)
That would be fine, can have CXR PA/lateral done to rule out PNA.

## 2013-12-10 NOTE — Progress Notes (Signed)
Rt LEA Duplex + ABI performed

## 2013-12-10 NOTE — Telephone Encounter (Signed)
Pt advised.

## 2013-12-10 NOTE — Progress Notes (Signed)
Carotid duplex performed 

## 2013-12-10 NOTE — Telephone Encounter (Signed)
I called patient about her lab results.   She was concerned that she continued to have a "bad" cough and is concerned about pneumonia.  She states she weighed 181lbs today, she is down 5 lbs since 12/05/13 office visit with Dr [name].  She is scheduled for R and LHC 12/14/13 and is asking if she should have CXR prior to cath.   I will forward to Dr [name] for review.

## 2013-12-11 ENCOUNTER — Ambulatory Visit: Payer: Medicare Other | Admitting: Cardiology

## 2013-12-11 ENCOUNTER — Telehealth: Payer: Self-pay | Admitting: Cardiology

## 2013-12-11 NOTE — Telephone Encounter (Signed)
Transferred pt to [facility].

## 2013-12-11 NOTE — Telephone Encounter (Signed)
Pt started holding Warfarin on Sunday 12/09/13, pt was supposed to start taking Lovenox yesterday 12/10/13.  Pt mistakenly took Warfarin yesterday instead of Lovenox.  Pt has not taken Warfarin today and will continue to hold until after procedure on 12/14/13.  Pt will start taking Lovenox this pm 1st dosage.  Pt will increase vit K/green leafy vegetable intake over the next few days to aid in decreasing INR.  Called spoke with [facility], Dr Claris Gladden RN made aware pt took dosage of Warfarin on Monday 12/10/13.  Advised to get INR check on Friday am prior to cath on 12/14/13.  She states she will notify Dr [name] above.

## 2013-12-11 NOTE — Telephone Encounter (Signed)
New message     Talk to the nurse regarding her procedure on Friday.  She did not start her lovonex yesterday---she took her warfarin yesterday

## 2013-12-14 ENCOUNTER — Encounter (HOSPITAL_COMMUNITY)
Admission: RE | Disposition: A | Discharge status: Home / Self Care | Payer: Self-pay | Source: Ambulatory Visit | Attending: Cardiology

## 2013-12-14 ENCOUNTER — Other Ambulatory Visit (HOSPITAL_COMMUNITY): Payer: Self-pay | Admitting: Cardiology

## 2013-12-14 ENCOUNTER — Ambulatory Visit (HOSPITAL_COMMUNITY)
Admission: RE | Admit: 2013-12-14 | Discharge: 2013-12-15 | Disposition: A | Discharge status: Home / Self Care | Payer: Medicare Other | Source: Ambulatory Visit | Attending: Cardiology | Admitting: Cardiology

## 2013-12-14 DIAGNOSIS — I6529 Occlusion and stenosis of unspecified carotid artery: Secondary | ICD-10-CM | POA: Insufficient documentation

## 2013-12-14 DIAGNOSIS — R9439 Abnormal result of other cardiovascular function study: Secondary | ICD-10-CM | POA: Diagnosis present

## 2013-12-14 DIAGNOSIS — R0609 Other forms of dyspnea: Secondary | ICD-10-CM

## 2013-12-14 DIAGNOSIS — I359 Nonrheumatic aortic valve disorder, unspecified: Secondary | ICD-10-CM | POA: Insufficient documentation

## 2013-12-14 DIAGNOSIS — I2510 Atherosclerotic heart disease of native coronary artery without angina pectoris: Secondary | ICD-10-CM | POA: Diagnosis not present

## 2013-12-14 DIAGNOSIS — I5032 Chronic diastolic (congestive) heart failure: Secondary | ICD-10-CM | POA: Diagnosis not present

## 2013-12-14 DIAGNOSIS — I739 Peripheral vascular disease, unspecified: Secondary | ICD-10-CM | POA: Diagnosis not present

## 2013-12-14 DIAGNOSIS — I10 Essential (primary) hypertension: Secondary | ICD-10-CM | POA: Diagnosis not present

## 2013-12-14 DIAGNOSIS — R918 Other nonspecific abnormal finding of lung field: Secondary | ICD-10-CM | POA: Diagnosis not present

## 2013-12-14 DIAGNOSIS — R0600 Dyspnea, unspecified: Secondary | ICD-10-CM

## 2013-12-14 DIAGNOSIS — G4733 Obstructive sleep apnea (adult) (pediatric): Secondary | ICD-10-CM | POA: Diagnosis not present

## 2013-12-14 DIAGNOSIS — E119 Type 2 diabetes mellitus without complications: Secondary | ICD-10-CM | POA: Diagnosis not present

## 2013-12-14 DIAGNOSIS — I200 Unstable angina: Secondary | ICD-10-CM | POA: Diagnosis not present

## 2013-12-14 DIAGNOSIS — Z952 Presence of prosthetic heart valve: Secondary | ICD-10-CM

## 2013-12-14 DIAGNOSIS — E785 Hyperlipidemia, unspecified: Secondary | ICD-10-CM | POA: Diagnosis present

## 2013-12-14 DIAGNOSIS — Z954 Presence of other heart-valve replacement: Secondary | ICD-10-CM | POA: Insufficient documentation

## 2013-12-14 DIAGNOSIS — Z951 Presence of aortocoronary bypass graft: Secondary | ICD-10-CM | POA: Insufficient documentation

## 2013-12-14 DIAGNOSIS — I25119 Atherosclerotic heart disease of native coronary artery with unspecified angina pectoris: Secondary | ICD-10-CM

## 2013-12-14 DIAGNOSIS — Z7901 Long term (current) use of anticoagulants: Secondary | ICD-10-CM

## 2013-12-14 DIAGNOSIS — K219 Gastro-esophageal reflux disease without esophagitis: Secondary | ICD-10-CM | POA: Diagnosis not present

## 2013-12-14 DIAGNOSIS — Z959 Presence of cardiac and vascular implant and graft, unspecified: Secondary | ICD-10-CM

## 2013-12-14 DIAGNOSIS — E1143 Type 2 diabetes mellitus with diabetic autonomic (poly)neuropathy: Secondary | ICD-10-CM | POA: Diagnosis present

## 2013-12-14 HISTORY — PX: LEFT AND RIGHT HEART CATHETERIZATION WITH CORONARY ANGIOGRAM: SHX5449

## 2013-12-14 HISTORY — PX: PERCUTANEOUS CORONARY STENT INTERVENTION (PCI-S): SHX5485

## 2013-12-14 HISTORY — PX: FRACTIONAL FLOW RESERVE WIRE: SHX5839

## 2013-12-14 LAB — POCT I-STAT 3, VENOUS BLOOD GAS (G3P V)
Acid-Base Excess: 2 mmol/L (ref 0.0–2.0)
Bicarbonate: 27.1 mEq/L — ABNORMAL HIGH (ref 20.0–24.0)
O2 Saturation: 64 %
TCO2: 28 mmol/L (ref 0–100)
pCO2, Ven: 41.5 mmHg — ABNORMAL LOW (ref 45.0–50.0)
pH, Ven: 7.423 — ABNORMAL HIGH (ref 7.250–7.300)
pO2, Ven: 33 mmHg (ref 30.0–45.0)

## 2013-12-14 LAB — POCT ACTIVATED CLOTTING TIME
Activated Clotting Time: 321 seconds
Activated Clotting Time: 326 seconds
Activated Clotting Time: 214 seconds
Activated Clotting Time: 163 seconds
Activated Clotting Time: 174 seconds

## 2013-12-14 LAB — GLUCOSE, CAPILLARY
Glucose-Capillary: 178 mg/dL — ABNORMAL HIGH (ref 70–99)
Glucose-Capillary: 169 mg/dL — ABNORMAL HIGH (ref 70–99)
Glucose-Capillary: 127 mg/dL — ABNORMAL HIGH (ref 70–99)

## 2013-12-14 LAB — POCT I-STAT 3, ART BLOOD GAS (G3+)
Acid-Base Excess: 2 mmol/L (ref 0.0–2.0)
Acid-base deficit: 2 mmol/L (ref 0.0–2.0)
Bicarbonate: 24.3 mEq/L — ABNORMAL HIGH (ref 20.0–24.0)
Bicarbonate: 26.3 mEq/L — ABNORMAL HIGH (ref 20.0–24.0)
O2 Saturation: 87 %
O2 Saturation: 98 %
TCO2: 26 mmol/L (ref 0–100)
TCO2: 28 mmol/L (ref 0–100)
pCO2 arterial: 44.7 mmHg (ref 35.0–45.0)
pCO2 arterial: 40.6 mmHg (ref 35.0–45.0)
pH, Arterial: 7.342 — ABNORMAL LOW (ref 7.350–7.450)
pH, Arterial: 7.42 (ref 7.350–7.450)
pO2, Arterial: 56 mmHg — ABNORMAL LOW (ref 80.0–100.0)
pO2, Arterial: 104 mmHg — ABNORMAL HIGH (ref 80.0–100.0)

## 2013-12-14 LAB — PROTIME-INR
INR: 1.12 (ref 0.00–1.49)
Prothrombin Time: 14.4 seconds (ref 11.6–15.2)

## 2013-12-14 LAB — POTASSIUM: Potassium: 4.4 mEq/L (ref 3.7–5.3)

## 2013-12-14 SURGERY — LEFT AND RIGHT HEART CATHETERIZATION WITH CORONARY ANGIOGRAM
Anesthesia: LOCAL

## 2013-12-14 MED ORDER — VITAMIN C 500 MG PO TABS
500.0000 mg | ORAL_TABLET | Freq: Two times a day (BID) | ORAL | Status: DC
  Administered 2013-12-14: 500 mg via ORAL
  Filled 2013-12-14 (×3): qty 1

## 2013-12-14 MED ORDER — FERROUS SULFATE 325 (65 FE) MG PO TABS
325.0000 mg | ORAL_TABLET | Freq: Every day | ORAL | Status: DC
  Administered 2013-12-14: 17:00:00 325 mg via ORAL
  Filled 2013-12-14 (×2): qty 1

## 2013-12-14 MED ORDER — CLOPIDOGREL BISULFATE 300 MG PO TABS
ORAL_TABLET | ORAL | Status: AC
  Filled 2013-12-14: qty 2

## 2013-12-14 MED ORDER — LIDOCAINE HCL (PF) 1 % IJ SOLN
INTRAMUSCULAR | Status: AC
  Filled 2013-12-14: qty 30

## 2013-12-14 MED ORDER — FAMOTIDINE IN NACL 20-0.9 MG/50ML-% IV SOLN
INTRAVENOUS | Status: AC
  Administered 2013-12-14: 20 mg via INTRAVENOUS
  Filled 2013-12-14: qty 50

## 2013-12-14 MED ORDER — SODIUM CHLORIDE 0.9 % IJ SOLN: 3.0000 mL | Freq: Two times a day (BID) | INTRAMUSCULAR | Status: DC

## 2013-12-14 MED ORDER — HEPARIN (PORCINE) IN NACL 2-0.9 UNIT/ML-% IJ SOLN
INTRAMUSCULAR | Status: AC
  Filled 2013-12-14: qty 1000

## 2013-12-14 MED ORDER — TORSEMIDE 20 MG PO TABS
40.0000 mg | ORAL_TABLET | Freq: Two times a day (BID) | ORAL | Status: DC
  Administered 2013-12-14 – 2013-12-15 (×2): 40 mg via ORAL
  Filled 2013-12-14 (×4): qty 2

## 2013-12-14 MED ORDER — NITROGLYCERIN IN D5W 200-5 MCG/ML-% IV SOLN
INTRAVENOUS | Status: AC
  Filled 2013-12-14: qty 250

## 2013-12-14 MED ORDER — MIDAZOLAM HCL 2 MG/2ML IJ SOLN
INTRAMUSCULAR | Status: AC
  Filled 2013-12-14: qty 2

## 2013-12-14 MED ORDER — HEPARIN SODIUM (PORCINE) 1000 UNIT/ML IJ SOLN
INTRAMUSCULAR | Status: AC
  Filled 2013-12-14: qty 1

## 2013-12-14 MED ORDER — DIPHENHYDRAMINE HCL 50 MG/ML IJ SOLN
25.0000 mg | INTRAMUSCULAR | Status: AC
  Administered 2013-12-14: 25 mg via INTRAVENOUS

## 2013-12-14 MED ORDER — ONDANSETRON HCL 4 MG/2ML IJ SOLN: 4.0000 mg | Freq: Four times a day (QID) | INTRAMUSCULAR | Status: DC | PRN

## 2013-12-14 MED ORDER — ASPIRIN 81 MG PO CHEW: 81.0000 mg | CHEWABLE_TABLET | Freq: Every day | ORAL | Status: DC

## 2013-12-14 MED ORDER — VERAPAMIL HCL 2.5 MG/ML IV SOLN
INTRAVENOUS | Status: AC
  Filled 2013-12-14: qty 2

## 2013-12-14 MED ORDER — PREDNISONE 20 MG PO TABS: 60.0000 mg | ORAL_TABLET | ORAL | Status: DC

## 2013-12-14 MED ORDER — INSULIN ASPART 100 UNIT/ML ~~LOC~~ SOLN
0.0000 [IU] | Freq: Three times a day (TID) | SUBCUTANEOUS | Status: DC
  Administered 2013-12-14: 18:00:00 3 [IU] via SUBCUTANEOUS

## 2013-12-14 MED ORDER — SODIUM CHLORIDE 0.9 % IV SOLN: 250.0000 mL | INTRAVENOUS | Status: DC | PRN

## 2013-12-14 MED ORDER — SODIUM CHLORIDE 0.9 % IV SOLN
INTRAVENOUS | Status: DC
  Administered 2013-12-14: 07:00:00 via INTRAVENOUS

## 2013-12-14 MED ORDER — CALCIUM CARBONATE-VITAMIN D 500-200 MG-UNIT PO TABS
1.0000 | ORAL_TABLET | Freq: Two times a day (BID) | ORAL | Status: DC
  Administered 2013-12-14: 1 via ORAL
  Filled 2013-12-14 (×3): qty 1

## 2013-12-14 MED ORDER — PREDNISONE 50 MG PO TABS: 60.0000 mg | ORAL_TABLET | ORAL | Status: AC

## 2013-12-14 MED ORDER — SODIUM CHLORIDE 0.9 % IV SOLN: INTRAVENOUS | Status: AC

## 2013-12-14 MED ORDER — ASPIRIN 81 MG PO CHEW
81.0000 mg | CHEWABLE_TABLET | ORAL | Status: AC
  Administered 2013-12-14: 81 mg via ORAL

## 2013-12-14 MED ORDER — LATANOPROST 0.005 % OP SOLN
1.0000 [drp] | Freq: Every day | OPHTHALMIC | Status: DC
  Administered 2013-12-14: 1 [drp] via OPHTHALMIC
  Filled 2013-12-14: qty 2.5

## 2013-12-14 MED ORDER — FOLIC ACID 1 MG PO TABS
1.0000 mg | ORAL_TABLET | Freq: Every day | ORAL | Status: DC
  Administered 2013-12-14: 1 mg via ORAL
  Filled 2013-12-14 (×2): qty 1

## 2013-12-14 MED ORDER — TRAZODONE 25 MG HALF TABLET
25.0000 mg | ORAL_TABLET | Freq: Every evening | ORAL | Status: DC | PRN
  Administered 2013-12-14: 50 mg via ORAL
  Filled 2013-12-14: qty 2

## 2013-12-14 MED ORDER — FENTANYL CITRATE 0.05 MG/ML IJ SOLN
INTRAMUSCULAR | Status: AC
  Filled 2013-12-14: qty 2

## 2013-12-14 MED ORDER — ASPIRIN 81 MG PO CHEW
CHEWABLE_TABLET | ORAL | Status: AC
  Administered 2013-12-14: 81 mg via ORAL
  Filled 2013-12-14: qty 1

## 2013-12-14 MED ORDER — GABAPENTIN 300 MG PO CAPS
300.0000 mg | ORAL_CAPSULE | Freq: Two times a day (BID) | ORAL | Status: DC | PRN
  Filled 2013-12-14: qty 1

## 2013-12-14 MED ORDER — DIPHENHYDRAMINE HCL 50 MG/ML IJ SOLN
INTRAMUSCULAR | Status: AC
  Administered 2013-12-14: 25 mg via INTRAVENOUS
  Filled 2013-12-14: qty 1

## 2013-12-14 MED ORDER — SODIUM CHLORIDE 0.9 % IJ SOLN: 3.0000 mL | INTRAMUSCULAR | Status: DC | PRN

## 2013-12-14 MED ORDER — ADENOSINE 12 MG/4ML IV SOLN
16.0000 mL | Freq: Once | INTRAVENOUS | Status: DC
  Filled 2013-12-14: qty 16

## 2013-12-14 MED ORDER — FAMOTIDINE IN NACL 20-0.9 MG/50ML-% IV SOLN
20.0000 mg | INTRAVENOUS | Status: AC
  Administered 2013-12-14: 20 mg via INTRAVENOUS

## 2013-12-14 MED ORDER — ACETAMINOPHEN 325 MG PO TABS: 650.0000 mg | ORAL_TABLET | ORAL | Status: DC | PRN

## 2013-12-14 MED ORDER — CLOPIDOGREL BISULFATE 75 MG PO TABS
75.0000 mg | ORAL_TABLET | Freq: Every day | ORAL | Status: DC
  Administered 2013-12-15: 75 mg via ORAL
  Filled 2013-12-14: qty 1

## 2013-12-14 MED ORDER — SIMVASTATIN 40 MG PO TABS
40.0000 mg | ORAL_TABLET | Freq: Every day | ORAL | Status: DC
  Administered 2013-12-14: 17:00:00 40 mg via ORAL
  Filled 2013-12-14 (×2): qty 1

## 2013-12-14 MED ORDER — METOPROLOL SUCCINATE ER 50 MG PO TB24
50.0000 mg | ORAL_TABLET | Freq: Every day | ORAL | Status: DC
  Administered 2013-12-14: 17:00:00 50 mg via ORAL
  Filled 2013-12-14 (×2): qty 1

## 2013-12-14 NOTE — CV Procedure (Addendum)
     Cardiac Catheterization Operative Report  Abigail Oliver [id number] 7/24/201511:00 AM Drucilla Schmidt, MD  Procedure Performed:  1. PTCA/DES x 1 proximal LAD   2. Fractional flow reserve proximal LAD  Operator: Lauree Chandler, MD  Indication:   68 yo female with history of CAD s/p CABG with dyspnea c/w unstable angina (class III). Diagnostic cath per Dr. [name] with severe stenosis proximal LAD.                                    Procedure Details: The risks, benefits, complications, treatment options, and expected outcomes were discussed with the patient before her diagnostic procedure. The patient and/or family concurred with the proposed plan, giving informed consent for possible PCI. When I entered the case she had a 5/6 [place] sheath present in the right radial artery. She was given a total of 10,000 units of IV heparin (4000 units pre-diagnostic cath and 6,000 units just before the PCI). ACT was over 300. She was given Plavix 600 mg po x 1. I engaged the left main with a CLS 3.0 guide. A pressure wire was advanced down the LAD. Baseline FFR 0.73 suggesting the stenosis was flow limiting. A Cougar IC wire was advanced down the LAD. A 2.5 x 12 mm balloon was used to pre-dilate the stenosis in the proximal LAD. There was moderate calcification but the balloon expanded well. A 3.0 x 12 mm Promus Premier DES was deployed in the mid LAD. The stent was post-dilated with a 3.25 x 8 mm Carlock balloon x 1. The stenosis was taken from 90% down to 0%. In the tortuous distal vessel there was moderate stenosis that was worsened during the case by wire bias but was at baseline when the wire was removed. There were no immediate complications. The patient was taken to the recovery area in stable condition.   Hemodynamic Findings: Central aortic pressure: 97/46  Fractional Flow Reserve: Baseline 0.73. No IV adenosine was given as baseline FFR significant.   Impression: 1. Unstable angina  secondary to severe stenosis proximal LAD 2. Successful PTCA/DES x 1 proximal LAD  Recommendations:She will need ASA and Plavix for at least one year. Continue statin and beta blocker.        Complications:  None; patient tolerated the procedure well.

## 2013-12-14 NOTE — Progress Notes (Signed)
Utilization Review Completed.Abigail Oliver T7/24/2015  

## 2013-12-14 NOTE — Interval H&P Note (Signed)
Cath Lab Visit (complete for each Cath Lab visit)  Clinical Evaluation Leading to the Procedure:   ACS: No.  Non-ACS:    Anginal Classification: CCS III  Anti-ischemic medical therapy: Minimal Therapy (1 class of medications)  Non-Invasive Test Results: Low-risk stress test findings: cardiac mortality <1%/year  Prior CABG: Previous CABG      History and Physical Interval Note:  12/14/2013 9:33 AM  Abigail Oliver  has presented today for surgery, with the diagnosis of heart failure  The various methods of treatment have been discussed with the patient and family. After consideration of risks, benefits and other options for treatment, the patient has consented to  Procedure(s): LEFT AND RIGHT HEART CATHETERIZATION WITH CORONARY ANGIOGRAM (N/A) as a surgical intervention .  The patient's history has been reviewed, patient examined, no change in status, stable for surgery.  I have reviewed the patient's chart and labs.  Questions were answered to the patient's satisfaction.     Phelicia Dantes [employer]

## 2013-12-14 NOTE — Progress Notes (Signed)
Patient here from [facility]. VSS. Dinner and blood sugar done on other unit. Right arm elevated for radial site. Will monitor cardiac. Tonita Cong , RN

## 2013-12-14 NOTE — Progress Notes (Signed)
Site area: right groin  Site Prior to Removal:  Level 0  Pressure Applied For 10 MINUTES    Minutes Beginning at 1430  Manual:   Yes.    Patient Status During Pull:  stable  Post Pull Groin Site:  Level 0  Post Pull Instructions Given:  Yes.    Post Pull Pulses Present:  Yes.    Dressing Applied:  Yes.    Comments:  Brachial site    TR BAND REMOVAL  LOCATION:  right radial  DEFLATED PER PROTOCOL:  Yes.    TIME BAND OFF / DRESSING APPLIED:   1500   SITE UPON ARRIVAL:   Level 0  SITE AFTER BAND REMOVAL:  Level 0  REVERSE ALLEN'S TEST:    positive  CIRCULATION SENSATION AND MOVEMENT:  Within Normal Limits  Yes.    COMMENTS:

## 2013-12-14 NOTE — H&P (View-Only) (Signed)
Patient ID: Abigail Oliver, female   DOB: 03/13/1946, 68 y.o.   MRN: [id number] PCP: Dr. Linda Hedges  68 yo with history of aortic stenosis s/p St Jude mechanical AVR (6/00) and CAD s/p single vessel CABG (6/00) presents for cardiology evaluation.  She had severe aortic stenosis and therefore required CABG-AVR in 6/00.  She had a RIMA-RCA. She had a myoview in 4/13 with no ischemia or infarction, and echo in 6/14 showed normal EF with moderate diastolic dysfunction and a well-seated mechanical valve.    In 6/15, torsemide was stopped and she was begun on Lasix due to increased cost of torsemide.  She developed dyspnea and lower extremity edema.  She was admitted later that month with community-acquired PNA as well as CHF.  CT showed RLL PNA and multiple pulmonary nodules.  Echo in 6/15 showed EF 60-65%, mechanical aortic valve with mean gradient 27 mmHg.   After discharge, she continued to be short of breath.  Lasix was increased to 80 mg bid.  This still has note helped.  Weight is up 7 lbs and she is not urinating nearly as much as she did on torsemide.  She has orthopnea and has to prop up the head of her bed.  +Bendopnea.  She is short of breath walking around in her yard.  No chest pain.  Lexiscan Cardiolite was done in 7/15 given the exertional symptoms and showed a small area of basal inferolateral ischemia.   Labs (6/14): BNP 216, HCT 32, K 4.8, creatinine 0.8 Labs (8/14): K 4.4, creatinine 0.8, BNP 55, HDL 45, LDL 141 Labs (10/14): LDL 61, HDL 43, AST 40, ALT 31 Labs (6/15): BNP 37 Labs (7/15): K 4.4, creatinine 0.8  PMH: 1. Aortic stenosis: Now has St Jude mechanical aortic valve (6/00).  Echo (6/14) with EF 60-65%, mild LVH, moderate diastolic dysfunction, normal RV, mechanical aortic valve with mean gradient 20 mmHg.  Echo (6/15) with EF 60-65%, mechanical aortic valve with mean gradient 27 mmHg.  2. CAD: CABG at time of AVR in 6/00 with RIMA-RCA.  Myoview in 4/13 with EF 72%, no ischemia or  infarction. Lexiscan Cardiolite (7/15) with EF 69%, small area of basal inferolateral ischemia.  3. HTN 4. Type II diabetes 5. GERD 6. OSA 7. Hyperlipidemia 8. PAD: Right SFA stent.  9. Colon polyps 10. Carotid stenosis: Carotid dopplers (11/13) witih 50-69% RICA.  11. Diastolic CHF 12. Gastritis 13. Dizziness: Holter (8/14) with few PACs, otherwise unremarkable.  14. Mild transaminase elevation 15. Pulmonary nodules: Noted by CT in 6/15.  16. Contrast allergy  SH: Married, works as Theme park manager, nonsmoker.   FH: CAD  ROS: All systems reviewed and negative except as per HPI.   Current Outpatient Prescriptions  Medication Sig Dispense Refill  . Ascorbic Acid (VITAMIN C) 500 MG tablet Take 500 mg by mouth 2 (two) times daily.       . calcium-vitamin D (CALCIUM 500+D) 500-200 MG-UNIT per tablet Take 1 tablet by mouth 2 (two) times daily.       . ferrous sulfate 325 (65 FE) MG tablet Take 1 tablet (325 mg total) by mouth daily.  30 tablet  0  . folic acid (FOLVITE) 1 MG tablet TAKE 1 TABLET BY MOUTH DAILY  90 tablet  0  . gabapentin (NEURONTIN) 300 MG capsule Take 300 mg by mouth as needed.      . latanoprost (XALATAN) 0.005 % ophthalmic solution Place 1 drop into both eyes at bedtime.      Marland Kitchen  metFORMIN (GLUCOPHAGE) 500 MG tablet Take 1,000 mg by mouth 2 (two) times daily with a meal.      . metoprolol succinate (TOPROL-XL) 50 MG 24 hr tablet TAKE 1 TABLET BY MOUTH TWICE DAILY  60 tablet  1  . pantoprazole (PROTONIX) 40 MG tablet Take 40 mg by mouth 2 (two) times daily as needed.      Marland Kitchen POTASSIUM CHLORIDE PO Take 60 mg by mouth 2 (two) times daily. 40mg  tablets : 1.5 tablets in the am, 1.5 tablets in the pm      . simvastatin (ZOCOR) 40 MG tablet Take 40 mg by mouth daily.      . traZODone (DESYREL) 50 MG tablet Take 0.5-1 tablets (25-50 mg total) by mouth at bedtime as needed for sleep.  30 tablet  3  . warfarin (COUMADIN) 1 MG tablet Take 1 tablet (1 mg total) by mouth one time only  at 6 PM.      . warfarin (COUMADIN) 3 MG tablet TAKE 1 TABLET BY MOUTH EVERY DAY AS DIRECTED  90 tablet  0  . predniSONE (DELTASONE) 20 MG tablet Take 3 tablets (60mg ) 12 hours before the procedure and 3 tablets (60mg ) 2 hours before the procedure  6 tablet  0  . torsemide (DEMADEX) 20 MG tablet 2 tablets (40mg ) two times a day  120 tablet  3   No current facility-administered medications for this visit.    BP 130/76  Pulse 66  Ht 5\' 2"  (1.575 m)  Wt 187 lb (84.823 kg)  BMI 34.19 kg/m2  SpO2 96% General: NAD Neck: JVP 9-10 cm, no thyromegaly or thyroid nodule.  Lungs: Clear to auscultation bilaterally with normal respiratory effort. CV: Nondisplaced PMI.  Heart regular S1/S2, mechanical S2, no S3/S4, 2/6 early SEM RUSB.  No ankle edema.  No carotid bruit.  Normal pedal pulses.  Abdomen: Soft, nontender, no hepatosplenomegaly, mild distention.  Neurologic: Alert and oriented x 3.  Psych: Normal affect. Extremities: No clubbing or cyanosis.   Assessment/Plan: 1. St Jude mechanical aortic valve:  Mildly elevated mean gradient for this valve on echo in 6/15.  Continue coumadin and add ASA 81 mg daily to her regimen.   2. Chronic diastolic CHF: Patient is volume overloaded on exam with NYHA class III symptoms.  This has been an issue ever since she switched off torsemide onto Lasix despite escalating Lasix dosing.   - I am going to have her stop Lasix and start torsemide 40 mg bid.  She will need a BMET in 1 week.  - I am going to arrange for right heart catheterization next week.   3. CAD: s/p RIMA-RCA at time of AVR.  No chest pain.  However, she has significant exertional dyspnea.  Lexiscan Cardiolite in 7/15 showed a small basal inferolateral reversible defect that was not seen on prior Cardiolite.  Given the abnormality on Cardiolite and her prominent exertional symptoms, I will arrange for LHC.  She will need premedication for contrast allergy as well as Lovenox bridging while off  coumadin.   4. Hyperlipidemia: Continue statin. 5. Carotid stenosis: I will arrange followup carotid dopplers.  6. PAD: Known PAD with right SFA stent.  I will arrange for peripheral arterial dopplers.  7. Pulmonary nodules: Repeat CT in 9/15.   Loralie Champagne 12/06/2013

## 2013-12-14 NOTE — CV Procedure (Signed)
    Cardiac Catheterization Procedure Note  Name: Abigail Oliver MRN: [id number] DOB: November 13, 1945  Procedure: Right Heart Cath, RIMA angiography, Selective Coronary Angiography  Indication: Severe exertional dyspnea without definite CHF.  Known CAD s/p RIMA-RCA.   Procedural Details: The right brachial and radial areas were prepped, draped, and anesthetized with 1% lidocaine. There was a pre-existing peripheral IV in a right brachial vein that was exchanged out for a 5 F venous sheath. A Swan-Ganz catheter was used for the right heart catheterization. Standard protocol was followed for recording of right heart pressures and sampling of oxygen saturations. Fick cardiac output was calculated. The right radial artery was then entered using modified Seldinger technique and 23F arterial sheath was placed.  The patient received 3 mg IA verapamil and weight-based IV heparin.  Standard Judkins catheters were used for selective coronary angiography and RIMA angiography. The patient will now have FFR of LAD stenosis with possible intervention.  Procedural Findings: Hemodynamics (mmHg) RA mean 2 RV 23/3 PA 22/11 PCWP mean 9 AO 97/46  Oxygen saturations: PA 64% AO 87%  Cardiac Output (Fick) 3.79  Cardiac Index (Fick) 6.98   Coronary angiography: Coronary dominance: right  Left mainstem: 30% distal left main.   Left anterior descending (LAD): 80% proximal LAD stenosis prior to D1.  Luminal irregularities in the remainder of the LAD.   Left circumflex (LCx): 30% mid and distal LCx stenosis.   Right coronary artery (RCA): Long 80% mid-RCA stenosis with patent RIMA-distal RCA.  There is competitive flow in the distal RCA.   Left ventriculography: Not done, mechanical aortic valve.    Final Conclusions:  Normal right and left heart filling pressures.  There was an 80% proximal LAD stenosis that could be the culprit for exertional dyspnea.  Patient will have FFR and if significant, PCI with  understanding that she will need long-term coumadin for her valve.  She would get Plavix if PCI needed.   Loralie Champagne 12/14/2013, 10:22 AM

## 2013-12-15 ENCOUNTER — Encounter (HOSPITAL_COMMUNITY): Payer: Self-pay | Admitting: Cardiology

## 2013-12-15 DIAGNOSIS — I2510 Atherosclerotic heart disease of native coronary artery without angina pectoris: Secondary | ICD-10-CM | POA: Diagnosis not present

## 2013-12-15 DIAGNOSIS — I200 Unstable angina: Secondary | ICD-10-CM | POA: Diagnosis not present

## 2013-12-15 DIAGNOSIS — R9439 Abnormal result of other cardiovascular function study: Secondary | ICD-10-CM | POA: Diagnosis not present

## 2013-12-15 DIAGNOSIS — R0609 Other forms of dyspnea: Secondary | ICD-10-CM | POA: Diagnosis not present

## 2013-12-15 DIAGNOSIS — I209 Angina pectoris, unspecified: Secondary | ICD-10-CM

## 2013-12-15 DIAGNOSIS — Z959 Presence of cardiac and vascular implant and graft, unspecified: Secondary | ICD-10-CM

## 2013-12-15 DIAGNOSIS — E785 Hyperlipidemia, unspecified: Secondary | ICD-10-CM | POA: Diagnosis not present

## 2013-12-15 DIAGNOSIS — R0989 Other specified symptoms and signs involving the circulatory and respiratory systems: Secondary | ICD-10-CM

## 2013-12-15 DIAGNOSIS — Z7901 Long term (current) use of anticoagulants: Secondary | ICD-10-CM | POA: Diagnosis not present

## 2013-12-15 DIAGNOSIS — Z954 Presence of other heart-valve replacement: Secondary | ICD-10-CM

## 2013-12-15 DIAGNOSIS — E119 Type 2 diabetes mellitus without complications: Secondary | ICD-10-CM | POA: Diagnosis not present

## 2013-12-15 LAB — CBC
HCT: 37.2 % (ref 36.0–46.0)
Hemoglobin: 11.3 g/dL — ABNORMAL LOW (ref 12.0–15.0)
MCH: 27.2 pg (ref 26.0–34.0)
MCHC: 30.4 g/dL (ref 30.0–36.0)
MCV: 89.4 fL (ref 78.0–100.0)
Platelets: 170 10*3/uL (ref 150–400)
RBC: 4.16 MIL/uL (ref 3.87–5.11)
RDW: 15.9 % — ABNORMAL HIGH (ref 11.5–15.5)
WBC: 11.6 10*3/uL — ABNORMAL HIGH (ref 4.0–10.5)

## 2013-12-15 LAB — BASIC METABOLIC PANEL
Anion gap: 15 (ref 5–15)
BUN: 23 mg/dL (ref 6–23)
CO2: 28 mEq/L (ref 19–32)
Calcium: 9.6 mg/dL (ref 8.4–10.5)
Chloride: 100 mEq/L (ref 96–112)
Creatinine, Ser: 0.77 mg/dL (ref 0.50–1.10)
GFR calc Af Amer: >90 mL/min (ref >90)
GFR calc non Af Amer: 84 mL/min — ABNORMAL LOW (ref >90)
Glucose, Bld: 136 mg/dL — ABNORMAL HIGH (ref 70–99)
Potassium: 3.8 mEq/L (ref 3.7–5.3)
Sodium: 143 mEq/L (ref 137–147)

## 2013-12-15 LAB — GLUCOSE, CAPILLARY: Glucose-Capillary: 120 mg/dL — ABNORMAL HIGH (ref 70–99)

## 2013-12-15 MED ORDER — CLOPIDOGREL BISULFATE 75 MG PO TABS: 75.0000 mg | ORAL_TABLET | Freq: Every day | ORAL | Status: DC

## 2013-12-15 MED ORDER — NITROGLYCERIN 0.4 MG SL SUBL: 0.4000 mg | SUBLINGUAL_TABLET | SUBLINGUAL | Status: DC | PRN

## 2013-12-15 MED ORDER — ASPIRIN 81 MG PO CHEW: 81.0000 mg | CHEWABLE_TABLET | Freq: Every day | ORAL | Status: DC

## 2013-12-15 MED ORDER — METFORMIN HCL 1000 MG PO TABS: 1000.0000 mg | ORAL_TABLET | Freq: Two times a day (BID) | ORAL | Status: DC

## 2013-12-15 NOTE — Discharge Instructions (Signed)
Call [facility] [phone] if any bleeding, swelling or drainage at cath site.  May shower, no tub baths for 48 hours for groin sticks. No driving for 3 days, no lifting over 5 pounds for 3 days.  Heart healthy diabetic diet.  No lovenox.  Call if any bleeding.  NO METFORMIN until Monday, it may interact with your cath dye.  Do not stop PLAVIX, stopping may cause a heart attack.  Take your usual home dose of potassium.

## 2013-12-15 NOTE — Discharge Summary (Signed)
Patient seen and examined and history reviewed. Agree with above findings and plan. See earlier rounding note   Abigail Oliver, [place] 12/15/2013 6:31 PM

## 2013-12-15 NOTE — Discharge Summary (Signed)
Physician Discharge Summary       Patient ID: Abigail Oliver MRN: [id number] DOB/AGE: 1945-10-07 68 y.o.  Admit date: 12/14/2013 Discharge date: 12/15/2013  Discharge Diagnoses:  Principal Problem:   Unstable angina Active Problems:   Abnormal stress test, 7/15, + ischemia   S/P arterial stent-to LAD, Promus DES 12/14/13   CAD, CABG 2000, low risk Myoview April 2011; 2015 + myoview   Chronic anticoagulation   DM, Type 2, NIDDM   H/O prosthetic aortic valve replacement, St Jude 2000   Dyslipidemia   Discharged Condition: good  Procedures: 12/14/13 cardiac cath Dr. [name] 12/14/13 Successful PTCA/DES x 1 proximal LAD Promus Premier by Dr. Shon Millet Course: 68 yo with history of aortic stenosis s/p St Jude mechanical AVR (6/00) and CAD s/p single vessel CABG (6/00) presents for cardiology evaluation. She had severe aortic stenosis and therefore required CABG-AVR in 6/00. She had a RIMA-RCA. She had a myoview in 4/13 with no ischemia or infarction, and echo in 6/14 showed normal EF with moderate diastolic dysfunction and a well-seated mechanical valve. In 6/15, torsemide was stopped and she was begun on Lasix due to increased cost of torsemide. She developed dyspnea and lower extremity edema. She was admitted later that month with community-acquired PNA as well as CHF. CT showed RLL PNA and multiple pulmonary nodules. Echo in 6/15 showed EF 60-65%, mechanical aortic valve with mean gradient 27 mmHg. After discharge, she continued to be short of breath. Lasix was increased to 80 mg bid. This still has note helped. Weight is up 7 lbs and she is not urinating nearly as much as she did on torsemide. She has orthopnea and has to prop up the head of her bed. +Bendopnea. She is short of breath walking around in her yard. No chest pain. Lexiscan Cardiolite was done in 7/15 given the exertional symptoms and showed a small area of basal inferolateral ischemia.   Pt brought in electively  with coumadin/lovenox crossover for cath revealing LAD stenosis and undergoing PCI with DES.  Today no chest pain, no SOB.  She will ambulate with cardiac rehab and if stable discharge to home. She was seen and evaluated by Dr. Martinique.   No lovenox but will resume coumadin at  Home dose. Con't ASA and plavix.  Follow up with Dr. [name] in 1-2 weeks, appt. Is made. Follow up in coumadin clinic. Goal INR 2-2.5.       Consults: None  Significant Diagnostic Studies:  BMET    Component Value Date/Time   NA 143 12/15/2013 0315   NA 138 12/21/2012 1043   K 3.8 12/15/2013 0315   K 3.9 12/21/2012 1043   CL 100 12/15/2013 0315   CO2 28 12/15/2013 0315   CO2 28 12/21/2012 1043   GLUCOSE 136* 12/15/2013 0315   GLUCOSE 313* 12/21/2012 1043   BUN 23 12/15/2013 0315   BUN 28.5* 12/21/2012 1043   CREATININE 0.77 12/15/2013 0315   CREATININE 1.1 12/21/2012 1043   CALCIUM 9.6 12/15/2013 0315   CALCIUM 10.1 12/21/2012 1043   GFRNONAA 84* 12/15/2013 0315   GFRAA >90 12/15/2013 0315    CBC    Component Value Date/Time   WBC 6.4 12/07/2013 1144   WBC 6.1 12/21/2012 1043   RBC 3.98 12/07/2013 1144   RBC 4.40 12/21/2012 1043   RBC 3.78* 10/24/2012 0410   HGB 11.2* 12/07/2013 1144   HGB 12.7 12/21/2012 1043   HCT 34.0* 12/07/2013 1144   HCT 37.5 12/21/2012 1043  PLT 176.0 12/07/2013 1144   PLT 198 12/21/2012 1043   MCV 85.3 12/07/2013 1144   MCV 85.1 12/21/2012 1043   MCH 27.7 10/31/2013 0332   MCH 28.7 12/21/2012 1043   MCHC 33.0 12/07/2013 1144   MCHC 33.8 12/21/2012 1043   RDW 17.5* 12/07/2013 1144   RDW 16.3* 12/21/2012 1043   LYMPHSABS 2.0 12/07/2013 1144   LYMPHSABS 1.9 12/21/2012 1043   MONOABS 0.5 12/07/2013 1144   MONOABS 0.5 12/21/2012 1043   EOSABS 0.1 12/07/2013 1144   EOSABS 0.1 12/21/2012 1043   BASOSABS 0.0 12/07/2013 1144   BASOSABS 0.1 12/21/2012 1043   Cardiac cath 12/14/13: Procedural Findings:  Hemodynamics (mmHg)  RA mean 2  RV 23/3  PA 22/11  PCWP mean 9  AO 97/46  Oxygen saturations:  PA 64%    AO 87%  Cardiac Output (Fick) 3.79  Cardiac Index (Fick) 6.98  Coronary angiography:  Coronary dominance: right  Left mainstem: 30% distal left main.  Left anterior descending (LAD): 80% proximal LAD stenosis prior to D1. Luminal irregularities in the remainder of the LAD.  Left circumflex (LCx): 30% mid and distal LCx stenosis.  Right coronary artery (RCA): Long 80% mid-RCA stenosis with patent RIMA-distal RCA. There is competitive flow in the distal RCA.  Left ventriculography: Not done, mechanical aortic valve.  Final Conclusions: Normal right and left heart filling pressures. There was an 80% proximal LAD stenosis that could be the culprit for exertional dyspnea. Patient will have FFR and if significant, PCI with understanding that she will need long-term coumadin for her valve. She would get Plavix if PCI needed.   Cardiac PCI: The risks, benefits, complications, treatment options, and expected outcomes were discussed with the patient before her diagnostic procedure. The patient and/or family concurred with the proposed plan, giving informed consent for possible PCI. When I entered the case she had a 5/6 [place] sheath present in the right radial artery. She was given a total of 10,000 units of IV heparin (4000 units pre-diagnostic cath and 6,000 units just before the PCI). ACT was over 300. She was given Plavix 600 mg po x 1. I engaged the left main with a CLS 3.0 guide. A pressure wire was advanced down the LAD. Baseline FFR 0.73 suggesting the stenosis was flow limiting. A Cougar IC wire was advanced down the LAD. A 2.5 x 12 mm balloon was used to pre-dilate the stenosis in the proximal LAD. There was moderate calcification but the balloon expanded well. A 3.0 x 12 mm Promus Premier DES was deployed in the mid LAD. The stent was post-dilated with a 3.25 x 8 mm [place] balloon x 1. The stenosis was taken from 90% down to 0%. In the tortuous distal vessel there was moderate stenosis that was worsened  during the case by wire bias but was at baseline when the wire was removed. There were no immediate complications. The patient was taken to the recovery area in stable condition.  Hemodynamic Findings:  Central aortic pressure: 97/46  Fractional Flow Reserve: Baseline 0.73. No IV adenosine was given as baseline FFR significant.  Impression:  1. Unstable angina secondary to severe stenosis proximal LAD  2. Successful PTCA/DES x 1 proximal LAD    EKG post procedure: Normal sinus rhythm Poor R wave progression Otherwise within normal limits      Discharge Exam: Blood pressure 119/49, pulse 65, temperature 97.9 F (36.6 C), temperature source Oral, resp. rate 18, height 5\' 2"  (1.575 m), weight 182 lb (  82.555 kg), SpO2 98.00%.    Disposition: 01-Home or Self Care     Medication List    STOP taking these medications       enoxaparin 80 MG/0.8ML injection  Commonly known as:  LOVENOX     potassium chloride SA 20 MEQ tablet  Commonly known as:  K-DUR,KLOR-CON     predniSONE 20 MG tablet  Commonly known as:  DELTASONE      TAKE these medications       aspirin 81 MG chewable tablet  Chew 1 tablet (81 mg total) by mouth daily.     CALCIUM 500+D 500-200 MG-UNIT per tablet  Generic drug:  calcium-vitamin D  Take 1 tablet by mouth 2 (two) times daily.     clopidogrel 75 MG tablet  Commonly known as:  PLAVIX  Take 1 tablet (75 mg total) by mouth daily with breakfast.     ferrous sulfate 325 (65 FE) MG tablet  Take 1 tablet (325 mg total) by mouth daily.     folic acid 1 MG tablet  Commonly known as:  FOLVITE  Take 1 mg by mouth daily.     gabapentin 300 MG capsule  Commonly known as:  NEURONTIN  Take 300 mg by mouth 2 (two) times daily as needed (nerve pain).     latanoprost 0.005 % ophthalmic solution  Commonly known as:  XALATAN  Place 1 drop into both eyes at bedtime.     metFORMIN 1000 MG tablet  Commonly known as:  GLUCOPHAGE  Take 1 tablet (1,000 mg total)  by mouth 2 (two) times daily with a meal.  Start taking on:  12/17/2013     metoprolol succinate 50 MG 24 hr tablet  Commonly known as:  TOPROL-XL  Take 50 mg by mouth daily. Take with or immediately following a meal.     nitroGLYCERIN 0.4 MG SL tablet  Commonly known as:  NITROSTAT  Place 1 tablet (0.4 mg total) under the tongue every 5 (five) minutes as needed for chest pain.     pantoprazole 40 MG tablet  Commonly known as:  PROTONIX  Take 40 mg by mouth 2 (two) times daily as needed.     POTASSIUM CHLORIDE PO  Take 60 mg by mouth 2 (two) times daily. 40mg  tablets : 1.5 tablets in the am, 1.5 tablets in the pm     simvastatin 40 MG tablet  Commonly known as:  ZOCOR  Take 40 mg by mouth daily at 6 PM.     torsemide 20 MG tablet  Commonly known as:  DEMADEX  Take 40 mg by mouth 2 (two) times daily.     traZODone 50 MG tablet  Commonly known as:  DESYREL  Take 0.5-1 tablets (25-50 mg total) by mouth at bedtime as needed for sleep.     vitamin C 500 MG tablet  Commonly known as:  ASCORBIC ACID  Take 500 mg by mouth 2 (two) times daily.     warfarin 3 MG tablet  Commonly known as:  COUMADIN  Take 3 mg by mouth daily at 6 PM.       Follow-up Information   Follow up with Loralie Champagne, MD On 12/20/2013. (at 2:30 pm)    Specialty:  Cardiology   Contact information:   [address] [place] [address] [phone]       Follow up with [facility] On 12/19/2013. (at 10:35 am )    Contact information:  [address] [place] [address]        Discharge Instructions: Call [facility] [phone] if any bleeding, swelling or drainage at cath site.  May shower, no tub baths for 48 hours for groin sticks. No driving for 3 days, no lifting over 5 pounds for 3 days.  Heart healthy diabetic diet.  No lovenox.  Call if any bleeding.   Signed: Isaiah Serge Nurse Practitioner-Certified [facility]: [facility] 12/15/2013, 9:49 AM  Time spent on discharge :>30 minutes.

## 2013-12-15 NOTE — Progress Notes (Signed)
68 year old female with hx of previous aortic stenosis s/p St Jude mechanical AVR (6/00) and CAD s/p single vessel CABG-RIMA-RCA (6/00)  presented with chest pain to office ,  Lexiscan Cardiolite (7/15) with EF 69%, small area of basal inferolateral ischemia. Pt then presented for elective cardiac cath revealing 80% LAD stenosis and patent RIMA-Distal RCA with competitive flow in dRCA.  She then underwent: 1. Unstable angina secondary to severe stenosis proximal LAD  2. Successful PTCA/DES x 1 proximal LAD   Subjective: No chest pain, no SOB, feels better  Objective: Vital signs in last 24 hours: Temp:  [97.9 F (36.6 C)-98.4 F (36.9 C)] 97.9 F (36.6 C) (07/25 0434) Pulse Rate:  [65-80] 65 (07/25 0434) Resp:  [16-18] 18 (07/25 0434) BP: (103-138)/(29-81) 119/49 mmHg (07/25 0434) SpO2:  [89 %-100 %] 98 % (07/25 0434) Weight:  [182 lb (82.555 kg)] 182 lb (82.555 kg) (07/24 1828) Weight change: 0 lb (0 kg) Last BM Date: 12/13/13 Intake/Output from previous day: +300 07/24 0701 - 07/25 0700 In: 300 [I.V.:300] Out: -  Intake/Output this shift:    PE: General:Pleasant affect, NAD Skin:Warm and dry, brisk capillary refill HEENT:normocephalic, sclera clear, mucus membranes moist Neck:supple, no JVD, no bruits  Heart:S1S2 RRR with 2/6 systolic murmur, no gallup, rub or click Lungs:clear without rales, rhonchi, or wheezes JKD:TOIZ, non tender, + BS, do not palpate liver spleen or masses Ext:no lower ext edema, 2+ pedal pulses, 2+ radial pulses- cath site without hematoma Neuro:alert and oriented, MAE, follows commands, + facial symmetry   Lab Results: No results found for this basename: WBC, HGB, HCT, PLT,  in the last 72 hours BMET  Recent Labs  12/14/13 0714 12/15/13 0315  NA  --  143  K 4.4 3.8  CL  --  100  CO2  --  28  GLUCOSE  --  136*  BUN  --  23  CREATININE  --  0.77  CALCIUM  --  9.6   No results found for this basename: TROPONINI, CK, MB,  in  the last 72 hours  Lab Results  Component Value Date   CHOL 136 03/05/2013   HDL 42.50 03/05/2013   LDLCALC 61 03/05/2013   LDLDIRECT 141.0 01/01/2013   TRIG 161.0* 03/05/2013   CHOLHDL 3 03/05/2013   Lab Results  Component Value Date   HGBA1C 5.8* 10/29/2013     Lab Results  Component Value Date   TSH 2.19 04/09/2013      Studies/Results: No results found.  Medications: I have reviewed the patient's current medications. Scheduled Meds: . aspirin  81 mg Oral Daily  . calcium-vitamin D  1 tablet Oral BID  . clopidogrel  75 mg Oral Q breakfast  . ferrous sulfate  325 mg Oral Daily  . folic acid  1 mg Oral Daily  . insulin aspart  0-15 Units Subcutaneous TID WC  . latanoprost  1 drop Both Eyes QHS  . metoprolol succinate  50 mg Oral Daily  . simvastatin  40 mg Oral q1800  . torsemide  40 mg Oral BID  . vitamin C  500 mg Oral BID   Continuous Infusions:  PRN Meds:.acetaminophen, gabapentin, ondansetron (ZOFRAN) IV, traZODone  Assessment/Plan: Principal Problem:   Unstable angina Active Problems:   Abnormal stress test, 7/15, + ischemia   S/P arterial stent-to LAD, Promus DES 12/14/13-on ASA, Plavix, resume coumadin and allow to drift up, no lovenox.    CAD, CABG 2000,  low risk Myoview April 2011; 2015 + myoview   Chronic anticoagulation-resume coumadin today at home dose   DM, Type 2, NIDDM-stable   H/O prosthetic aortic valve replacement, St Jude 2000, continue coumadin   Dyslipidemia-continue zocor   Chronic diastolic HF-currently stable   Carotid disease-RICA 66-06% stenosis, LICA 3-01% stenosis    OSA  Plan for discharge today after ambulation with cardiac rehab  LOS: 1 day   Time spent with pt. :15 minutes. [facility] R  Nurse Practitioner Certified Pager [phone] or after 5pm and on weekends call [phone] 12/15/2013, 7:48 AM   Patient seen and examined and history reviewed. Agree with above findings and plan. Right wrist has modest bruising but no  hematoma or swelling. No chest pain. Ecg and labs stable. Plan DAPT with ASA 81 mg and Plavix. Resume coumadin with target INR 2-2.5. I would not bridge with Lovenox. Stable for DC today.  Wiliam Cauthorn Martin[place], [place] 12/15/2013 9:30 AM

## 2013-12-15 NOTE — Progress Notes (Addendum)
CARDIAC REHAB PHASE I   PRE:  Rate/Rhythm: 63 sinus rhythm  BP:  Supine:   Sitting: 119/48  Standing:    SaO2: 98% room air  MODE:  Ambulation: 550 ft   POST:  Rate/Rhythem: 79 sinus rhythm  BP:  Supine:   Sitting: 140/50  Standing:    SaO2: 99% room air [id number] Pt ambulated in hallway without difficulty.  Steady gait. Pt education completed including risk factors, diet, exercise, medication.  CHF booklet reviewed and given to pt.  Pt oriented to outpatient cardiac rehab.  At pt request, referral will be sent to [facility].  Written and verbal information given. Pt verbalized understanding [place], [employer]

## 2013-12-17 LAB — GLUCOSE, CAPILLARY: Glucose-Capillary: 150 mg/dL — ABNORMAL HIGH (ref 70–99)

## 2013-12-19 ENCOUNTER — Telehealth: Payer: Self-pay | Admitting: Cardiology

## 2013-12-19 NOTE — Telephone Encounter (Signed)
New message     Pt had a cath last fri---can she drive tomorrow?  She has an hosp f/u tomorrow.

## 2013-12-19 NOTE — Telephone Encounter (Signed)
Advised ok to drive tomorrow per discharge instructions.

## 2013-12-20 ENCOUNTER — Encounter: Payer: Self-pay | Admitting: Cardiology

## 2013-12-20 ENCOUNTER — Ambulatory Visit (INDEPENDENT_AMBULATORY_CARE_PROVIDER_SITE_OTHER): Payer: Medicare Other | Admitting: Cardiology

## 2013-12-20 ENCOUNTER — Ambulatory Visit (INDEPENDENT_AMBULATORY_CARE_PROVIDER_SITE_OTHER): Payer: Medicare Other | Admitting: Pharmacist

## 2013-12-20 VITALS — BP 118/60 | HR 64 | Ht 62.0 in | Wt 179.1 lb

## 2013-12-20 DIAGNOSIS — I2510 Atherosclerotic heart disease of native coronary artery without angina pectoris: Secondary | ICD-10-CM | POA: Diagnosis not present

## 2013-12-20 DIAGNOSIS — Z9889 Other specified postprocedural states: Secondary | ICD-10-CM

## 2013-12-20 DIAGNOSIS — Z952 Presence of prosthetic heart valve: Secondary | ICD-10-CM

## 2013-12-20 DIAGNOSIS — Z7901 Long term (current) use of anticoagulants: Secondary | ICD-10-CM | POA: Diagnosis not present

## 2013-12-20 DIAGNOSIS — R918 Other nonspecific abnormal finding of lung field: Secondary | ICD-10-CM

## 2013-12-20 DIAGNOSIS — Z959 Presence of cardiac and vascular implant and graft, unspecified: Secondary | ICD-10-CM

## 2013-12-20 DIAGNOSIS — I5032 Chronic diastolic (congestive) heart failure: Secondary | ICD-10-CM | POA: Diagnosis not present

## 2013-12-20 DIAGNOSIS — Z954 Presence of other heart-valve replacement: Secondary | ICD-10-CM | POA: Diagnosis not present

## 2013-12-20 DIAGNOSIS — I25810 Atherosclerosis of coronary artery bypass graft(s) without angina pectoris: Secondary | ICD-10-CM | POA: Diagnosis not present

## 2013-12-20 LAB — POCT INR: INR: 1.6

## 2013-12-20 NOTE — Patient Instructions (Signed)
Your physician recommends that you schedule a follow-up appointment in: 3 months with Dr. [name]  Non-Cardiac CT scanning, (CAT scanning), is a noninvasive, special x-ray that produces cross-sectional images of the body using x-rays and a computer. CT scans help physicians diagnose and treat medical conditions. For some CT exams, a contrast material is used to enhance visibility in the area of the body being studied. CT scans provide greater clarity and reveal more details than regular x-ray exams.  Stay on Aspirin, Plavix, and Warfarin for 4 more weeks.  In 4 weeks stop Aspirin and stay on Plavix and Warfarin for a 12 months.  At that time stop Plavix and go back to Aspirin and Warfarin

## 2013-12-20 NOTE — Progress Notes (Signed)
Patient ID: Abigail Oliver, female   DOB: 07/27/1945, 68 y.o.   MRN: [id number] PCP: Dr. Doug Sou  68 yo with history of aortic stenosis s/p St Jude mechanical AVR (6/00) and CAD s/p single vessel CABG (6/00) presents for cardiology evaluation.  She had severe aortic stenosis and therefore required CABG-AVR in 6/00.  She had a RIMA-RCA. She had a myoview in 4/13 with no ischemia or infarction, and echo in 6/14 showed normal EF with moderate diastolic dysfunction and a well-seated mechanical valve.    In 6/15, torsemide was stopped and she was begun on Lasix due to increased cost of torsemide.  She developed dyspnea and lower extremity edema.  She was admitted later that month with community-acquired PNA as well as CHF.  CT showed RLL PNA and multiple pulmonary nodules.  Echo in 6/15 showed EF 60-65%, mechanical aortic valve with mean gradient 27 mmHg.   After discharge, she continued to be short of breath.  Lasix was increased to 80 mg bid.  This did not help much.  She was short of breath walking around in her yard.  No chest pain.  Lexiscan Cardiolite was done in 7/15 given the exertional symptoms and showed a small area of basal inferolateral ischemia.   Given ongoing exertional symptoms, I did a right and left heart catheterization in 7/15.  Right and left heart filling pressures were normal.  There was an 80% stenosis in the proximal LAD that was hemodynamically significant by FFR.  Patient had DES to proximal LAD.    Since PCI, she is feeling much better.  She is not getting short of breath with exertion.  She can lie flat. She still has some fatigue. No chest pain.   Labs (6/14): BNP 216, HCT 32, K 4.8, creatinine 0.8 Labs (8/14): K 4.4, creatinine 0.8, BNP 55, HDL 45, LDL 141 Labs (10/14): LDL 61, HDL 43, AST 40, ALT 31 Labs (6/15): BNP 37 Labs (7/15): K 4.4, creatinine 0.8 => 0.77  PMH: 1. Aortic stenosis: Now has St Jude mechanical aortic valve (6/00).  Echo (6/14) with EF 60-65%, mild  LVH, moderate diastolic dysfunction, normal RV, mechanical aortic valve with mean gradient 20 mmHg.  Echo (6/15) with EF 60-65%, mechanical aortic valve with mean gradient 27 mmHg.  2. CAD: CABG at time of AVR in 6/00 with RIMA-RCA.  Myoview in 4/13 with EF 72%, no ischemia or infarction. Lexiscan Cardiolite (7/15) with EF 69%, small area of basal inferolateral ischemia.  LHC (7/15) with patent RIMA-RCA, 80% mRCA, 80% pLAD with FFR 0.73, treated with DES to pLAD.  3. HTN 4. Type II diabetes 5. GERD 6. OSA 7. Hyperlipidemia 8. PAD: Right SFA stent. Peripheral arterial dopplers (7/15) with patent right SFA stent.  9. Colon polyps 10. Carotid stenosis: Carotid dopplers (11/13) witih 50-69% RICA.  Carotid dopplers (7/15) with 40-59% RICA.  11. Diastolic CHF 12. Gastritis 13. Dizziness: Holter (8/14) with few PACs, otherwise unremarkable.  14. Mild transaminase elevation 15. Pulmonary nodules: Noted by CT in 6/15.  16. Contrast allergy 17. Diastolic CHF: RHC (5/36) with mean RA 2, PA 22/11, mean PCWP 9, CI 3.79  SH: Married, works as Theme park manager, nonsmoker.   FH: CAD  ROS: All systems reviewed and negative except as per HPI.   Current Outpatient Prescriptions  Medication Sig Dispense Refill  . Ascorbic Acid (VITAMIN C) 500 MG tablet Take 500 mg by mouth 2 (two) times daily.       Marland Kitchen aspirin 81 MG chewable tablet  Chew 1 tablet (81 mg total) by mouth daily.      . calcium-vitamin D (CALCIUM 500+D) 500-200 MG-UNIT per tablet Take 1 tablet by mouth 2 (two) times daily.       . citalopram (CELEXA) 20 MG tablet Take 1 tablet by mouth at bedtime.      . clopidogrel (PLAVIX) 75 MG tablet Take 1 tablet (75 mg total) by mouth daily with breakfast.  30 tablet  11  . ferrous sulfate 325 (65 FE) MG tablet Take 1 tablet (325 mg total) by mouth daily.  30 tablet  0  . folic acid (FOLVITE) 1 MG tablet Take 1 mg by mouth daily.      Marland Kitchen gabapentin (NEURONTIN) 300 MG capsule Take 300 mg by mouth 2 (two) times  daily as needed (nerve pain).       Marland Kitchen latanoprost (XALATAN) 0.005 % ophthalmic solution Place 1 drop into both eyes at bedtime.      . metFORMIN (GLUCOPHAGE) 1000 MG tablet Take 1 tablet (1,000 mg total) by mouth 2 (two) times daily with a meal.      . metoprolol succinate (TOPROL-XL) 50 MG 24 hr tablet Take 50 mg by mouth daily. Take with or immediately following a meal.      . nitroGLYCERIN (NITROSTAT) 0.4 MG SL tablet Place 1 tablet (0.4 mg total) under the tongue every 5 (five) minutes as needed for chest pain.  25 tablet  3  . pantoprazole (PROTONIX) 40 MG tablet Take 40 mg by mouth 2 (two) times daily as needed.      Marland Kitchen POTASSIUM CHLORIDE PO 40mg  tablets : 1.5 tablets in the am, 1.5 tablets in the pm      . simvastatin (ZOCOR) 40 MG tablet Take 40 mg by mouth daily at 6 PM.       . torsemide (DEMADEX) 20 MG tablet Take 40 mg by mouth 2 (two) times daily.      . traZODone (DESYREL) 50 MG tablet Take 0.5-1 tablets (25-50 mg total) by mouth at bedtime as needed for sleep.  30 tablet  3  . warfarin (COUMADIN) 3 MG tablet Take 3 mg by mouth daily at 6 PM.       No current facility-administered medications for this visit.    BP 118/60  Pulse 64  Ht 5\' 2"  (1.575 m)  Wt 179 lb 1.9 oz (81.248 kg)  BMI 32.75 kg/m2 General: NAD Neck: JVP 7 cm, no thyromegaly or thyroid nodule.  Lungs: Clear to auscultation bilaterally with normal respiratory effort. CV: Nondisplaced PMI.  Heart regular S1/S2, mechanical S2, no S3/S4, 2/6 early SEM RUSB.  No ankle edema.  No carotid bruit.  Normal pedal pulses.  Abdomen: Soft, nontender, no hepatosplenomegaly, mild distention.  Neurologic: Alert and oriented x 3.  Psych: Normal affect. Extremities: No clubbing or cyanosis. Right radial cath site benign.   Assessment/Plan: 1. St Jude mechanical aortic valve:  Mildly elevated mean gradient for this valve on echo in 6/15.  Continue coumadin.   2. Chronic diastolic CHF: NYHA class II symptoms (much improved)  since PCI.  RHC did not suggest significant volume overload.  Continue current torsemide.     3. CAD: s/p RIMA-RCA at time of AVR.  She developed exertional dyspnea and had cath showing significant proximal LAD stenosis in 7/15 that was treated with BMS.  She feels much better since PCI.  - Continue ASA 81 + Plavix + warfarin x 1 month, then continue Plavix + warfarin until  1 year, then go back to ASA 81 + warfarin.  - Continue statin.  4. Hyperlipidemia: Continue statin. 5. Carotid stenosis: Stable, repeat 7/16.  6. PAD: Known PAD with right SFA stent.  Recent peripheral arterial dopplers showed patent R SFA stent.   7. Pulmonary nodules: Repeat CT in 9/15.   Loralie Champagne 12/20/2013

## 2013-12-24 ENCOUNTER — Ambulatory Visit: Payer: Medicare Other | Admitting: Pharmacist Clinician (PhC)/ Clinical Pharmacy Specialist

## 2013-12-24 ENCOUNTER — Ambulatory Visit (INDEPENDENT_AMBULATORY_CARE_PROVIDER_SITE_OTHER)
Admission: RE | Admit: 2013-12-24 | Discharge: 2013-12-24 | Disposition: A | Discharge status: Home / Self Care | Payer: Medicare Other | Source: Ambulatory Visit | Attending: Cardiology | Admitting: Cardiology

## 2013-12-24 DIAGNOSIS — I2510 Atherosclerotic heart disease of native coronary artery without angina pectoris: Secondary | ICD-10-CM | POA: Diagnosis not present

## 2013-12-24 DIAGNOSIS — R918 Other nonspecific abnormal finding of lung field: Secondary | ICD-10-CM | POA: Diagnosis not present

## 2013-12-24 DIAGNOSIS — J984 Other disorders of lung: Secondary | ICD-10-CM | POA: Diagnosis not present

## 2013-12-28 ENCOUNTER — Telehealth: Payer: Self-pay | Admitting: Cardiology

## 2013-12-28 NOTE — Telephone Encounter (Signed)
New message      Want CT results.  Please call pt even if test has not been read.

## 2013-12-28 NOTE — Telephone Encounter (Signed)
LMTCB (Dr. [name] reviewed CT. Pulmonary nodules benign per Dr. [name])  Horton Chin RN

## 2013-12-28 NOTE — Telephone Encounter (Signed)
Pt is aware of CT results Horton Chin RN

## 2013-12-31 ENCOUNTER — Ambulatory Visit (INDEPENDENT_AMBULATORY_CARE_PROVIDER_SITE_OTHER): Payer: Medicare Other | Admitting: Pharmacist

## 2013-12-31 DIAGNOSIS — Z954 Presence of other heart-valve replacement: Secondary | ICD-10-CM | POA: Diagnosis not present

## 2013-12-31 DIAGNOSIS — Z7901 Long term (current) use of anticoagulants: Secondary | ICD-10-CM | POA: Diagnosis not present

## 2013-12-31 DIAGNOSIS — Z952 Presence of prosthetic heart valve: Secondary | ICD-10-CM

## 2013-12-31 LAB — POCT INR: INR: 2.5

## 2014-01-02 DIAGNOSIS — Z1231 Encounter for screening mammogram for malignant neoplasm of breast: Secondary | ICD-10-CM | POA: Diagnosis not present

## 2014-01-14 ENCOUNTER — Ambulatory Visit (INDEPENDENT_AMBULATORY_CARE_PROVIDER_SITE_OTHER): Payer: Medicare Other | Admitting: Pharmacist

## 2014-01-14 DIAGNOSIS — Z952 Presence of prosthetic heart valve: Secondary | ICD-10-CM

## 2014-01-14 DIAGNOSIS — Z954 Presence of other heart-valve replacement: Secondary | ICD-10-CM | POA: Diagnosis not present

## 2014-01-14 DIAGNOSIS — Z7901 Long term (current) use of anticoagulants: Secondary | ICD-10-CM

## 2014-01-14 LAB — POCT INR: INR: 2.7

## 2014-01-17 ENCOUNTER — Encounter (HOSPITAL_COMMUNITY)
Admission: RE | Admit: 2014-01-17 | Discharge: 2014-01-17 | Disposition: A | Discharge status: Home / Self Care | Payer: Medicare Other | Source: Ambulatory Visit | Attending: Cardiology | Admitting: Cardiology

## 2014-01-17 NOTE — Progress Notes (Signed)
Cardiac Rehab Medication Review by a Pharmacist  Does the patient  feel that his/her medications are working for him/her?  yes  Has the patient been experiencing any side effects to the medications prescribed?  Yes; she has been experiencing dry mouth, changes appetite  Does the patient measure his/her own blood pressure or blood glucose at home?  Yes; Runs around 120 to 130 in AM  Does the patient have any problems obtaining medications due to transportation or finances?   yes  Understanding of regimen: good Understanding of indications: good Potential of compliance: good    Pharmacist comments: Patient seems concerned about taking her medications and the side effects associated with them.  She stated that she has experienced GI upset with metformin and does not feel that she needs to be on as high of a dose of metformin.  I encouraged her to talk with her MD about these concerns and other medication options for managing her diabetes.  She was also worried that the Torsemide may not be working although her weights remain stable.  Again I encouraged her to discuss these issues with her physician.  She expressed understanding.    Noheli Melder, Maud Deed 01/17/2014 8:13 AM

## 2014-01-21 ENCOUNTER — Encounter (HOSPITAL_COMMUNITY): Payer: Medicare Other

## 2014-01-23 ENCOUNTER — Encounter: Payer: Self-pay | Admitting: Cardiology

## 2014-01-23 ENCOUNTER — Encounter (HOSPITAL_COMMUNITY): Payer: Medicare Other

## 2014-01-23 ENCOUNTER — Ambulatory Visit (INDEPENDENT_AMBULATORY_CARE_PROVIDER_SITE_OTHER): Payer: Medicare Other | Admitting: Cardiology

## 2014-01-23 VITALS — BP 110/58 | HR 69 | Ht 62.0 in | Wt 181.0 lb

## 2014-01-23 DIAGNOSIS — I739 Peripheral vascular disease, unspecified: Secondary | ICD-10-CM

## 2014-01-23 DIAGNOSIS — R918 Other nonspecific abnormal finding of lung field: Secondary | ICD-10-CM

## 2014-01-23 DIAGNOSIS — I25810 Atherosclerosis of coronary artery bypass graft(s) without angina pectoris: Secondary | ICD-10-CM

## 2014-01-23 DIAGNOSIS — I5032 Chronic diastolic (congestive) heart failure: Secondary | ICD-10-CM | POA: Diagnosis not present

## 2014-01-23 DIAGNOSIS — Z959 Presence of cardiac and vascular implant and graft, unspecified: Secondary | ICD-10-CM

## 2014-01-23 DIAGNOSIS — Z9889 Other specified postprocedural states: Secondary | ICD-10-CM

## 2014-01-23 DIAGNOSIS — I2510 Atherosclerotic heart disease of native coronary artery without angina pectoris: Secondary | ICD-10-CM

## 2014-01-23 LAB — BASIC METABOLIC PANEL
BUN: 25 mg/dL — ABNORMAL HIGH (ref 6–23)
CO2: 28 mEq/L (ref 19–32)
Calcium: 10 mg/dL (ref 8.4–10.5)
Chloride: 97 mEq/L (ref 96–112)
Creatinine, Ser: 1 mg/dL (ref 0.4–1.2)
GFR: 58.53 mL/min — ABNORMAL LOW (ref >60.00)
Glucose, Bld: 87 mg/dL (ref 70–99)
Potassium: 3.8 mEq/L (ref 3.5–5.1)
Sodium: 138 mEq/L (ref 135–145)

## 2014-01-23 LAB — LIPID PANEL
Cholesterol: 111 mg/dL (ref 0–200)
HDL: 39.5 mg/dL (ref >39.00)
LDL Cholesterol: 32 mg/dL (ref 0–99)
NonHDL: 71.5
Total CHOL/HDL Ratio: 3
Triglycerides: 198 mg/dL — ABNORMAL HIGH (ref 0.0–149.0)
VLDL: 39.6 mg/dL (ref 0.0–40.0)

## 2014-01-23 LAB — CBC WITH DIFFERENTIAL/PLATELET
Basophils Absolute: 0 10*3/uL (ref 0.0–0.1)
Basophils Relative: 0.6 % (ref 0.0–3.0)
Eosinophils Absolute: 0.1 10*3/uL (ref 0.0–0.7)
Eosinophils Relative: 1.2 % (ref 0.0–5.0)
HCT: 34.5 % — ABNORMAL LOW (ref 36.0–46.0)
Hemoglobin: 11.4 g/dL — ABNORMAL LOW (ref 12.0–15.0)
Lymphocytes Relative: 26.7 % (ref 12.0–46.0)
Lymphs Abs: 1.9 10*3/uL (ref 0.7–4.0)
MCHC: 33.2 g/dL (ref 30.0–36.0)
MCV: 85 fl (ref 78.0–100.0)
Monocytes Absolute: 0.6 10*3/uL (ref 0.1–1.0)
Monocytes Relative: 9.3 % (ref 3.0–12.0)
Neutro Abs: 4.3 10*3/uL (ref 1.4–7.7)
Neutrophils Relative %: 62.2 % (ref 43.0–77.0)
Platelets: 193 10*3/uL (ref 150.0–400.0)
RBC: 4.05 Mil/uL (ref 3.87–5.11)
RDW: 16.3 % — ABNORMAL HIGH (ref 11.5–15.5)
WBC: 6.9 10*3/uL (ref 4.0–10.5)

## 2014-01-23 NOTE — Patient Instructions (Addendum)
Your physician recommends that you have  lab work today--Lipid profile/CBCd/BMET.  Stop aspirin.  Your physician wants you to follow-up in: 4 months with Dr [name]. (January 2016). You will receive a reminder letter in the mail two months in advance. If you don't receive a letter, please call our office to schedule the follow-up appointment.

## 2014-01-24 NOTE — Progress Notes (Signed)
Patient ID: Abigail Oliver, female   DOB: 04-Aug-1945, 68 y.o.   MRN: [id number] PCP: Dr. Doug Sou  68 yo with history of aortic stenosis s/p St Jude mechanical AVR (6/00) and CAD s/p single vessel CABG (6/00) presents for cardiology evaluation.  She had severe aortic stenosis and therefore required CABG-AVR in 6/00.  She had a RIMA-RCA. She had a myoview in 4/13 with no ischemia or infarction, and echo in 6/14 showed normal EF with moderate diastolic dysfunction and a well-seated mechanical valve.    In 6/15, torsemide was stopped and she was begun on Lasix due to increased cost of torsemide.  She developed dyspnea and lower extremity edema.  She was admitted later that month with community-acquired PNA as well as CHF.  CT showed RLL PNA and multiple pulmonary nodules.  Echo in 6/15 showed EF 60-65%, mechanical aortic valve with mean gradient 27 mmHg.   After discharge, she continued to be short of breath.  Lasix was increased to 80 mg bid.  This did not help much.  She was short of breath walking around in her yard.  No chest pain.  Lexiscan Cardiolite was done in 7/15 given the exertional symptoms and showed a small area of basal inferolateral ischemia.   Given ongoing exertional symptoms, I did a right and left heart catheterization in 7/15.  Right and left heart filling pressures were normal.  There was an 80% stenosis in the proximal LAD that was hemodynamically significant by FFR.  Patient had DES to proximal LAD.    Since PCI, she is feeling much better.  She is not getting short of breath with exertion.  She can lie flat. She still has some fatigue with grocery shopping. No chest pain.  She is working Chief of Staff).  She is trying to walk 30 minutes/day.  She has been on triple anticoagulant therapy for 1 month but has had no BRBPR or melena.   Labs (6/14): BNP 216, HCT 32, K 4.8, creatinine 0.8 Labs (8/14): K 4.4, creatinine 0.8, BNP 55, HDL 45, LDL 141 Labs (10/14): LDL 61, HDL 43, AST 40,  ALT 31 Labs (6/15): BNP 37 Labs (7/15): K 4.4, creatinine 0.8 => 0.77  PMH: 1. Aortic stenosis: Now has St Jude mechanical aortic valve (6/00).  Echo (6/14) with EF 60-65%, mild LVH, moderate diastolic dysfunction, normal RV, mechanical aortic valve with mean gradient 20 mmHg.  Echo (6/15) with EF 60-65%, mechanical aortic valve with mean gradient 27 mmHg.  2. CAD: CABG at time of AVR in 6/00 with RIMA-RCA.  Myoview in 4/13 with EF 72%, no ischemia or infarction. Lexiscan Cardiolite (7/15) with EF 69%, small area of basal inferolateral ischemia.  LHC (7/15) with patent RIMA-RCA, 80% mRCA, 80% pLAD with FFR 0.73, treated with DES to pLAD.  3. HTN 4. Type II diabetes 5. GERD 6. OSA 7. Hyperlipidemia 8. PAD: Right SFA stent. Peripheral arterial dopplers (7/15) with patent right SFA stent.  9. Colon polyps 10. Carotid stenosis: Carotid dopplers (11/13) witih 50-69% RICA.  Carotid dopplers (7/15) with 40-59% RICA.  11. Diastolic CHF 12. Gastritis 13. Dizziness: Holter (8/14) with few PACs, otherwise unremarkable.  14. Mild transaminase elevation 15. Pulmonary nodules: Noted by CT in 6/15.  CT chest (8/15) was thought to indicate that nodules were benign.  No further workup was recommended.  16. Contrast allergy 17. Diastolic CHF: RHC (7/42) with mean RA 2, PA 22/11, mean PCWP 9, CI 3.79  SH: Married, works as Theme park manager, nonsmoker.   FH:  CAD  ROS: All systems reviewed and negative except as per HPI.   Current Outpatient Prescriptions  Medication Sig Dispense Refill  . Ascorbic Acid (VITAMIN C) 500 MG tablet Take 500 mg by mouth 2 (two) times daily.       . calcium-vitamin D (CALCIUM 500+D) 500-200 MG-UNIT per tablet Take 1 tablet by mouth 2 (two) times daily.       . citalopram (CELEXA) 20 MG tablet Take 1 tablet by mouth at bedtime.      . clopidogrel (PLAVIX) 75 MG tablet Take 1 tablet (75 mg total) by mouth daily with breakfast.  30 tablet  11  . cyclobenzaprine (FLEXERIL) 5 MG  tablet Take 5 mg by mouth daily at 8 pm.      . ferrous sulfate 325 (65 FE) MG tablet Take 1 tablet (325 mg total) by mouth daily.  30 tablet  0  . folic acid (FOLVITE) 1 MG tablet Take 1 mg by mouth daily.      Marland Kitchen latanoprost (XALATAN) 0.005 % ophthalmic solution Place 1 drop into both eyes at bedtime.      . metFORMIN (GLUCOPHAGE) 1000 MG tablet Take 1 tablet (1,000 mg total) by mouth 2 (two) times daily with a meal.      . metoprolol succinate (TOPROL-XL) 50 MG 24 hr tablet Take 50 mg by mouth 2 (two) times daily. Take with or immediately following a meal.      . nitroGLYCERIN (NITROSTAT) 0.4 MG SL tablet Place 1 tablet (0.4 mg total) under the tongue every 5 (five) minutes as needed for chest pain.  25 tablet  3  . pantoprazole (PROTONIX) 40 MG tablet Take 40 mg by mouth 2 (two) times daily as needed.      Marland Kitchen POTASSIUM CHLORIDE PO 40mg  tablets : 1.5 tablets in the am, 1.5 tablets in the pm      . simvastatin (ZOCOR) 40 MG tablet Take 40 mg by mouth daily at 6 PM.       . torsemide (DEMADEX) 20 MG tablet Take 40 mg by mouth 2 (two) times daily.      . traMADol (ULTRAM) 50 MG tablet Take 50 mg by mouth 2 (two) times daily.      . traZODone (DESYREL) 50 MG tablet Take 0.5-1 tablets (25-50 mg total) by mouth at bedtime as needed for sleep.  30 tablet  3  . warfarin (COUMADIN) 3 MG tablet Take 3 mg by mouth daily at 6 PM. Per patient, takes 3 mg every day except Friday take 4.5 mg       No current facility-administered medications for this visit.    BP 110/58  Pulse 69  Ht 5\' 2"  (1.575 m)  Wt 181 lb (82.101 kg)  BMI 33.10 kg/m2  SpO2 96% General: NAD Neck: JVP 7 cm, no thyromegaly or thyroid nodule.  Lungs: Clear to auscultation bilaterally with normal respiratory effort. CV: Nondisplaced PMI.  Heart regular S1/S2, mechanical S2, no S3/S4, 1/6 early SEM RUSB.  No ankle edema.  No carotid bruit.  Normal pedal pulses.  Abdomen: Soft, nontender, no hepatosplenomegaly, mild distention.   Neurologic: Alert and oriented x 3.  Psych: Normal affect. Extremities: No clubbing or cyanosis. Right radial cath site benign.   Assessment/Plan: 1. St Jude mechanical aortic valve:  Mildly elevated mean gradient for this valve on echo in 6/15.  Continue coumadin.  Will check CBC today.  2. Chronic diastolic CHF: NYHA class II symptoms (much improved) since PCI.  Elmer  did not suggest significant volume overload.  Continue current torsemide.  Will check BMET/BNP today.  3. CAD: s/p RIMA-RCA at time of AVR.  She developed exertional dyspnea and had cath showing significant proximal LAD stenosis in 7/15 that was treated with BMS.  She feels much better since PCI.  - She can stop aspirin now.  She will continue Plavix + warfarin until 1 year, then go back to ASA 81 + warfarin.  - Continue statin.  4. Hyperlipidemia: Continue statin. 5. Carotid stenosis: Stable, repeat 7/16.  6. PAD: Known PAD with right SFA stent.  Recent peripheral arterial dopplers showed patent R SFA stent.   7. Pulmonary nodules: Had CT in 8/15, nodules thought to be benign.    Loralie Champagne 01/24/2014

## 2014-01-27 ENCOUNTER — Other Ambulatory Visit: Payer: Self-pay | Admitting: Pharmacist Clinician (PhC)/ Clinical Pharmacy Specialist

## 2014-01-27 ENCOUNTER — Other Ambulatory Visit: Payer: Self-pay | Admitting: Cardiology

## 2014-01-30 ENCOUNTER — Encounter (HOSPITAL_COMMUNITY): Payer: Medicare Other

## 2014-02-04 ENCOUNTER — Encounter (HOSPITAL_COMMUNITY): Payer: Medicare Other

## 2014-02-04 DIAGNOSIS — Z6832 Body mass index (BMI) 32.0-32.9, adult: Secondary | ICD-10-CM | POA: Diagnosis not present

## 2014-02-04 DIAGNOSIS — E1149 Type 2 diabetes mellitus with other diabetic neurological complication: Secondary | ICD-10-CM | POA: Diagnosis not present

## 2014-02-04 DIAGNOSIS — Z23 Encounter for immunization: Secondary | ICD-10-CM | POA: Diagnosis not present

## 2014-02-04 DIAGNOSIS — E1142 Type 2 diabetes mellitus with diabetic polyneuropathy: Secondary | ICD-10-CM | POA: Diagnosis not present

## 2014-02-04 DIAGNOSIS — E669 Obesity, unspecified: Secondary | ICD-10-CM | POA: Diagnosis not present

## 2014-02-06 ENCOUNTER — Encounter (HOSPITAL_COMMUNITY): Payer: Medicare Other

## 2014-02-11 ENCOUNTER — Encounter (HOSPITAL_COMMUNITY): Payer: Medicare Other

## 2014-02-11 ENCOUNTER — Ambulatory Visit (INDEPENDENT_AMBULATORY_CARE_PROVIDER_SITE_OTHER): Payer: Medicare Other | Admitting: Pharmacist Clinician (PhC)/ Clinical Pharmacy Specialist

## 2014-02-11 DIAGNOSIS — Z7901 Long term (current) use of anticoagulants: Secondary | ICD-10-CM | POA: Diagnosis not present

## 2014-02-11 DIAGNOSIS — Z954 Presence of other heart-valve replacement: Secondary | ICD-10-CM | POA: Diagnosis not present

## 2014-02-11 DIAGNOSIS — Z952 Presence of prosthetic heart valve: Secondary | ICD-10-CM

## 2014-02-11 LAB — POCT INR: INR: 1.9

## 2014-02-13 ENCOUNTER — Encounter (HOSPITAL_COMMUNITY): Payer: Medicare Other

## 2014-02-18 ENCOUNTER — Encounter: Payer: Self-pay | Admitting: Internal Medicine

## 2014-02-18 ENCOUNTER — Ambulatory Visit (INDEPENDENT_AMBULATORY_CARE_PROVIDER_SITE_OTHER): Payer: Medicare Other | Admitting: Internal Medicine

## 2014-02-18 ENCOUNTER — Encounter (HOSPITAL_COMMUNITY): Payer: Medicare Other

## 2014-02-18 VITALS — BP 124/64 | HR 63 | Temp 98.3°F | Resp 12 | Ht 62.0 in | Wt 176.8 lb

## 2014-02-18 DIAGNOSIS — M5126 Other intervertebral disc displacement, lumbar region: Secondary | ICD-10-CM | POA: Diagnosis not present

## 2014-02-18 DIAGNOSIS — I2510 Atherosclerotic heart disease of native coronary artery without angina pectoris: Secondary | ICD-10-CM | POA: Diagnosis not present

## 2014-02-18 DIAGNOSIS — E1149 Type 2 diabetes mellitus with other diabetic neurological complication: Secondary | ICD-10-CM | POA: Diagnosis not present

## 2014-02-18 DIAGNOSIS — I25810 Atherosclerosis of coronary artery bypass graft(s) without angina pectoris: Secondary | ICD-10-CM

## 2014-02-18 DIAGNOSIS — F331 Major depressive disorder, recurrent, moderate: Secondary | ICD-10-CM

## 2014-02-18 DIAGNOSIS — Z954 Presence of other heart-valve replacement: Secondary | ICD-10-CM

## 2014-02-18 DIAGNOSIS — G909 Disorder of the autonomic nervous system, unspecified: Secondary | ICD-10-CM

## 2014-02-18 DIAGNOSIS — I359 Nonrheumatic aortic valve disorder, unspecified: Secondary | ICD-10-CM

## 2014-02-18 DIAGNOSIS — Z952 Presence of prosthetic heart valve: Secondary | ICD-10-CM

## 2014-02-18 DIAGNOSIS — R918 Other nonspecific abnormal finding of lung field: Secondary | ICD-10-CM

## 2014-02-18 DIAGNOSIS — E785 Hyperlipidemia, unspecified: Secondary | ICD-10-CM

## 2014-02-18 DIAGNOSIS — E1143 Type 2 diabetes mellitus with diabetic autonomic (poly)neuropathy: Secondary | ICD-10-CM

## 2014-02-18 MED ORDER — TRAZODONE HCL 50 MG PO TABS: 25.0000 mg | ORAL_TABLET | Freq: Every evening | ORAL | Status: DC | PRN

## 2014-02-18 MED ORDER — METFORMIN HCL 1000 MG PO TABS: 1000.0000 mg | ORAL_TABLET | Freq: Two times a day (BID) | ORAL | Status: DC

## 2014-02-18 MED ORDER — TRAMADOL HCL 50 MG PO TABS: 50.0000 mg | ORAL_TABLET | Freq: Two times a day (BID) | ORAL | Status: DC

## 2014-02-18 MED ORDER — CYCLOBENZAPRINE HCL 5 MG PO TABS: 5.0000 mg | ORAL_TABLET | Freq: Two times a day (BID) | ORAL | Status: DC | PRN

## 2014-02-18 NOTE — Assessment & Plan Note (Signed)
Continue simvastatin 40 mg daily. 

## 2014-02-18 NOTE — Patient Instructions (Signed)
We have refilled your medicines today and will not check blood work. Come back in about 6 months to see the doctor. If you are feeling sick or have problems sooner please feel free to call us back.  Diabetes and Exercise Exercising regularly is important. It is not just about losing weight. It has many health benefits, such as:  Improving your overall fitness, flexibility, and endurance.  Increasing your bone density.  Helping with weight control.  Decreasing your body fat.  Increasing your muscle strength.  Reducing stress and tension.  Improving your overall health. People with diabetes who exercise gain additional benefits because exercise:  Reduces appetite.  Improves the body's use of blood sugar (glucose).  Helps lower or control blood glucose.  Decreases blood pressure.  Helps control blood lipids (such as cholesterol and triglycerides).  Improves the body's use of the hormone insulin by:  Increasing the body's insulin sensitivity.  Reducing the body's insulin needs.  Decreases the risk for heart disease because exercising:  Lowers cholesterol and triglycerides levels.  Increases the levels of good cholesterol (such as high-density lipoproteins [HDL]) in the body.  Lowers blood glucose levels. YOUR ACTIVITY PLAN  Choose an activity that you enjoy and set realistic goals. Your health care provider or diabetes educator can help you make an activity plan that works for you. Exercise regularly as directed by your health care provider. This includes:  Performing resistance training twice a week such as push-ups, sit-ups, lifting weights, or using resistance bands.  Performing 150 minutes of cardio exercises each week such as walking, running, or playing sports.  Staying active and spending no more than 90 minutes at one time being inactive. Even short bursts of exercise are good for you. Three 10-minute sessions spread throughout the day are just as beneficial as  a single 30-minute session. Some exercise ideas include:  Taking the dog for a walk.  Taking the stairs instead of the elevator.  Dancing to your favorite song.  Doing an exercise video.  Doing your favorite exercise with a friend. RECOMMENDATIONS FOR EXERCISING WITH TYPE 1 OR TYPE 2 DIABETES   Check your blood glucose before exercising. If blood glucose levels are greater than 240 mg/dL, check for urine ketones. Do not exercise if ketones are present.  Avoid injecting insulin into areas of the body that are going to be exercised. For example, avoid injecting insulin into:  The arms when playing tennis.  The legs when jogging.  Keep a record of:  Food intake before and after you exercise.  Expected peak times of insulin action.  Blood glucose levels before and after you exercise.  The type and amount of exercise you have done.  Review your records with your health care provider. Your health care provider will help you to develop guidelines for adjusting food intake and insulin amounts before and after exercising.  If you take insulin or oral hypoglycemic agents, watch for signs and symptoms of hypoglycemia. They include:  Dizziness.  Shaking.  Sweating.  Chills.  Confusion.  Drink plenty of water while you exercise to prevent dehydration or heat stroke. Body water is lost during exercise and must be replaced.  Talk to your health care provider before starting an exercise program to make sure it is safe for you. Remember, almost any type of activity is better than none. Document Released: 07/31/2003 Document Revised: 09/24/2013 Document Reviewed: 10/17/2012 [facility] Patient Information 2015 [place], [place]. This information is not intended to replace advice given to you  by your health care provider. Make sure you discuss any questions you have with your health care provider.

## 2014-02-18 NOTE — Assessment & Plan Note (Signed)
She continues on Celexa and is doing well. She does use trazodone for sleep.

## 2014-02-18 NOTE — Progress Notes (Signed)
   Subjective:    Patient ID: Abigail Oliver, female    DOB: 17-Nov-1945, 68 y.o.   MRN: [id number]  HPI Is the patient is a 68 year old female comes in today to establish care. She has past medical history of CHF, recent coronary stent in June, diabetes, hypertension, hyperlipidemia, glaucoma, depression. She is fairly stable at today's visit and has recently seen her cardiologist. She does have some carotid artery stenosis however they would like to wait one year until she is able to be off Plavix before they do that procedure. She denies shortness of breath or new swelling or change in her weight recently. She is weighing herself daily at home. She sees Dr. Buddy Duty for her diabetes and currently is only taking metformin. She is not exercising much at this time.  Review of Systems  Constitutional: Negative for fever, diaphoresis, activity change, appetite change and fatigue.  HENT: Negative.   Eyes: Negative.   Respiratory: Negative for cough, chest tightness, shortness of breath and wheezing.   Cardiovascular: Negative for chest pain, palpitations and leg swelling.  Gastrointestinal: Negative for abdominal pain, diarrhea, constipation and abdominal distention.  Endocrine: Negative.   Musculoskeletal: Positive for arthralgias and back pain. Negative for gait problem and myalgias.  Skin: Negative.   Neurological: Negative for dizziness, syncope, weakness, light-headedness and headaches.      Objective:   Physical Exam  Constitutional: She is oriented to person, place, and time. She appears well-developed and well-nourished. No distress.  HENT:  Head: Normocephalic and atraumatic.  Eyes: EOM are normal.  Neck: Normal range of motion.  Cardiovascular: Normal rate.   Artificial valve click noted  Pulmonary/Chest: Effort normal and breath sounds normal. No respiratory distress. She has no wheezes. She has no rales.  Abdominal: Soft. Bowel sounds are normal. She exhibits no distension. There  is no tenderness. There is no rebound.  Neurological: She is alert and oriented to person, place, and time. Coordination normal.  Skin: Skin is warm and dry.  See diabetes foot exam.   Filed Vitals:   02/18/14 0958  BP: 124/64  Pulse: 63  Temp: 98.3 F (36.8 C)  TempSrc: Oral  Resp: 12  Height: 5\' 2"  (1.575 m)  Weight: 176 lb 12.8 oz (80.196 kg)  SpO2: 98%      Assessment & Plan:  Diabetic foot exam done.

## 2014-02-18 NOTE — Assessment & Plan Note (Signed)
Patient continues on Plavix, Coumadin. Last stent placed June 2015. Currently without anginal symptoms. She is also on beta blocker, statin.

## 2014-02-18 NOTE — Assessment & Plan Note (Signed)
Continue Coumadin. 

## 2014-02-18 NOTE — Assessment & Plan Note (Signed)
Patient status post replacement and continues on Coumadin.

## 2014-02-18 NOTE — Assessment & Plan Note (Signed)
Will look into whether this scan was repeated. If not she is due for repeat.

## 2014-02-18 NOTE — Progress Notes (Signed)
Pre visit review using our clinic review tool, if applicable. No additional management support is needed unless otherwise documented below in the visit note. 

## 2014-02-18 NOTE — Assessment & Plan Note (Addendum)
She does see Dr. Buddy Duty and states that last hemoglobin A1c was 5.7. She sees him every 6 months. She continues on metformin 1000 mg twice a day.

## 2014-02-18 NOTE — Assessment & Plan Note (Signed)
Patient uses Flexeril, tramadol for back pain. Refilled at today's visit.

## 2014-02-20 ENCOUNTER — Encounter (HOSPITAL_COMMUNITY): Payer: Medicare Other

## 2014-02-25 ENCOUNTER — Encounter (HOSPITAL_COMMUNITY): Payer: Medicare Other

## 2014-02-27 ENCOUNTER — Encounter (HOSPITAL_COMMUNITY): Payer: Medicare Other

## 2014-03-04 ENCOUNTER — Encounter (HOSPITAL_COMMUNITY): Payer: Medicare Other

## 2014-03-04 ENCOUNTER — Ambulatory Visit (INDEPENDENT_AMBULATORY_CARE_PROVIDER_SITE_OTHER): Payer: Medicare Other | Admitting: Pharmacist Clinician (PhC)/ Clinical Pharmacy Specialist

## 2014-03-04 DIAGNOSIS — Z7901 Long term (current) use of anticoagulants: Secondary | ICD-10-CM | POA: Diagnosis not present

## 2014-03-04 DIAGNOSIS — Z954 Presence of other heart-valve replacement: Secondary | ICD-10-CM | POA: Diagnosis not present

## 2014-03-04 DIAGNOSIS — Z952 Presence of prosthetic heart valve: Secondary | ICD-10-CM

## 2014-03-04 LAB — POCT INR: INR: 2.4

## 2014-03-06 ENCOUNTER — Encounter (HOSPITAL_COMMUNITY): Payer: Medicare Other

## 2014-03-07 ENCOUNTER — Other Ambulatory Visit: Payer: Self-pay | Admitting: Geriatric Medicine

## 2014-03-07 MED ORDER — FOLIC ACID 1 MG PO TABS: 1.0000 mg | ORAL_TABLET | Freq: Every day | ORAL | Status: DC

## 2014-03-07 MED ORDER — LATANOPROST 0.005 % OP SOLN: 1.0000 [drp] | Freq: Every day | OPHTHALMIC | Status: DC

## 2014-03-08 ENCOUNTER — Other Ambulatory Visit: Payer: Self-pay

## 2014-03-11 ENCOUNTER — Encounter (HOSPITAL_COMMUNITY): Payer: Medicare Other

## 2014-03-13 ENCOUNTER — Encounter (HOSPITAL_COMMUNITY): Payer: Medicare Other

## 2014-03-18 ENCOUNTER — Encounter (HOSPITAL_COMMUNITY): Payer: Medicare Other

## 2014-03-18 ENCOUNTER — Ambulatory Visit: Payer: Medicare Other | Admitting: Pharmacist Clinician (PhC)/ Clinical Pharmacy Specialist

## 2014-03-20 ENCOUNTER — Encounter (HOSPITAL_COMMUNITY): Payer: Medicare Other

## 2014-03-21 ENCOUNTER — Ambulatory Visit: Payer: Medicare Other | Admitting: Cardiology

## 2014-03-25 ENCOUNTER — Encounter (HOSPITAL_COMMUNITY): Payer: Medicare Other

## 2014-03-27 ENCOUNTER — Telehealth: Payer: Self-pay | Admitting: Pharmacist Clinician (PhC)/ Clinical Pharmacy Specialist

## 2014-03-27 ENCOUNTER — Encounter (HOSPITAL_COMMUNITY): Payer: Medicare Other

## 2014-03-28 ENCOUNTER — Encounter: Payer: Self-pay | Admitting: Internal Medicine

## 2014-03-28 ENCOUNTER — Ambulatory Visit (INDEPENDENT_AMBULATORY_CARE_PROVIDER_SITE_OTHER): Payer: Medicare Other | Admitting: Internal Medicine

## 2014-03-28 VITALS — BP 120/60 | HR 62 | Temp 98.2°F | Resp 18 | Ht 62.0 in | Wt 178.1 lb

## 2014-03-28 DIAGNOSIS — I2510 Atherosclerotic heart disease of native coronary artery without angina pectoris: Secondary | ICD-10-CM

## 2014-03-28 DIAGNOSIS — H6123 Impacted cerumen, bilateral: Secondary | ICD-10-CM | POA: Diagnosis not present

## 2014-03-28 DIAGNOSIS — H6120 Impacted cerumen, unspecified ear: Secondary | ICD-10-CM | POA: Insufficient documentation

## 2014-03-28 NOTE — Assessment & Plan Note (Signed)
Left greater than right causing some hearing loss. After disimpaction hearing regained in both ears and canals clear.

## 2014-03-28 NOTE — Progress Notes (Signed)
   Subjective:    Patient ID: Abigail Oliver, female    DOB: [birth year], 68 y.o.   MRN: [id number]  HPI The patient is a 68 year old female comes in today with acute hearing loss. Over the last several weeks she has noticed that she's had more wax in her ears and suddenly in the shower she felt like she got water in her ears and they blocked up entirely.she is still able to hear however it is muffled on both sides. She denies tinnitus. She denies fever, chills, congestion in her nose, drainage down her throat, shortness of breath, cough.  Review of Systems  Constitutional: Negative for fever, diaphoresis, activity change, appetite change and fatigue.  HENT: Positive for ear discharge and hearing loss. Negative for ear pain and facial swelling.   Eyes: Negative.   Respiratory: Negative for cough, chest tightness, shortness of breath and wheezing.   Cardiovascular: Negative for chest pain, palpitations and leg swelling.  Gastrointestinal: Negative for abdominal pain, diarrhea, constipation and abdominal distention.  Endocrine: Negative.   Musculoskeletal: Negative for myalgias and gait problem.  Skin: Negative.   Neurological: Negative for dizziness, syncope, weakness, light-headedness and headaches.      Objective:   Physical Exam  Constitutional: She appears well-developed and well-nourished.  HENT:  Head: Normocephalic and atraumatic.  Left greater than right cerumen impaction, after disimpaction both ears with normal TMs.  Eyes: EOM are normal.  Neck: Normal range of motion.  Cardiovascular: Normal rate and regular rhythm.   Pulmonary/Chest: Effort normal and breath sounds normal.  Abdominal: Soft. Bowel sounds are normal.   Filed Vitals:   03/28/14 0830  BP: 120/60  Pulse: 62  Temp: 98.2 F (36.8 C)  TempSrc: Oral  Resp: 18  Height: 5\' 2"  (1.575 m)  Weight: 178 lb 1.9 oz (80.795 kg)  SpO2: 98%      Assessment & Plan:

## 2014-03-28 NOTE — Patient Instructions (Signed)
We have cleaned your ears today and hopefully that should make hearing a lot easier. If you're still having difficulty hearing please feel free to call back our office.  You can use saline drops in your ears to help dilute the wax and make it soft. You should not use Q-tips on your ears more than once per week as this can damage the ear canals.  Cerumen Impaction A cerumen impaction is when the wax in your ear forms a plug. This plug usually causes reduced hearing. Sometimes it also causes an earache or dizziness. Removing a cerumen impaction can be difficult and painful. The wax sticks to the ear canal. The canal is sensitive and bleeds easily. If you try to remove a heavy wax buildup with a cotton tipped swab, you may push it in further. Irrigation with water, suction, and small ear curettes may be used to clear out the wax. If the impaction is fixed to the skin in the ear canal, ear drops may be needed for a few days to loosen the wax. People who build up a lot of wax frequently can use ear wax removal products available in your local drugstore. SEEK MEDICAL CARE IF:  You develop an earache, increased hearing loss, or marked dizziness. Document Released: 06/17/2004 Document Revised: 08/02/2011 Document Reviewed: 08/07/2009 [facility] Patient Information 2015 [place], [place]. This information is not intended to replace advice given to you by your health care provider. Make sure you discuss any questions you have with your health care provider.

## 2014-03-28 NOTE — Progress Notes (Signed)
Pre visit review using our clinic review tool, if applicable. No additional management support is needed unless otherwise documented below in the visit note. 

## 2014-03-29 ENCOUNTER — Ambulatory Visit (INDEPENDENT_AMBULATORY_CARE_PROVIDER_SITE_OTHER): Payer: Medicare Other | Admitting: Pharmacist Clinician (PhC)/ Clinical Pharmacy Specialist

## 2014-03-29 DIAGNOSIS — Z952 Presence of prosthetic heart valve: Secondary | ICD-10-CM

## 2014-03-29 DIAGNOSIS — Z7901 Long term (current) use of anticoagulants: Secondary | ICD-10-CM | POA: Diagnosis not present

## 2014-03-29 DIAGNOSIS — Z954 Presence of other heart-valve replacement: Secondary | ICD-10-CM

## 2014-03-29 LAB — POCT INR: INR: 2.6

## 2014-04-01 ENCOUNTER — Other Ambulatory Visit: Payer: Self-pay

## 2014-04-01 ENCOUNTER — Encounter (HOSPITAL_COMMUNITY): Payer: Medicare Other

## 2014-04-01 MED ORDER — TORSEMIDE 20 MG PO TABS: 40.0000 mg | ORAL_TABLET | Freq: Two times a day (BID) | ORAL | Status: DC

## 2014-04-01 NOTE — Telephone Encounter (Signed)
Closed encounter °

## 2014-04-03 ENCOUNTER — Encounter (HOSPITAL_COMMUNITY): Payer: Medicare Other

## 2014-04-08 ENCOUNTER — Encounter (HOSPITAL_COMMUNITY): Payer: Medicare Other

## 2014-04-10 ENCOUNTER — Encounter (HOSPITAL_COMMUNITY): Payer: Medicare Other

## 2014-04-15 ENCOUNTER — Encounter (HOSPITAL_COMMUNITY): Payer: Medicare Other

## 2014-04-15 DIAGNOSIS — H4011X1 Primary open-angle glaucoma, mild stage: Secondary | ICD-10-CM | POA: Diagnosis not present

## 2014-04-15 DIAGNOSIS — Z961 Presence of intraocular lens: Secondary | ICD-10-CM | POA: Diagnosis not present

## 2014-04-16 ENCOUNTER — Encounter: Payer: Self-pay | Admitting: Family Medicine

## 2014-04-16 ENCOUNTER — Ambulatory Visit (INDEPENDENT_AMBULATORY_CARE_PROVIDER_SITE_OTHER)
Admission: RE | Admit: 2014-04-16 | Discharge: 2014-04-16 | Disposition: A | Discharge status: Home / Self Care | Payer: Medicare Other | Source: Ambulatory Visit | Attending: Family Medicine | Admitting: Family Medicine

## 2014-04-16 ENCOUNTER — Ambulatory Visit (INDEPENDENT_AMBULATORY_CARE_PROVIDER_SITE_OTHER): Payer: Medicare Other | Admitting: Family Medicine

## 2014-04-16 ENCOUNTER — Other Ambulatory Visit (INDEPENDENT_AMBULATORY_CARE_PROVIDER_SITE_OTHER): Payer: Medicare Other

## 2014-04-16 VITALS — BP 92/62 | HR 64 | Ht 62.0 in | Wt 174.0 lb

## 2014-04-16 DIAGNOSIS — M75102 Unspecified rotator cuff tear or rupture of left shoulder, not specified as traumatic: Secondary | ICD-10-CM | POA: Diagnosis not present

## 2014-04-16 DIAGNOSIS — I2510 Atherosclerotic heart disease of native coronary artery without angina pectoris: Secondary | ICD-10-CM

## 2014-04-16 DIAGNOSIS — M19012 Primary osteoarthritis, left shoulder: Secondary | ICD-10-CM | POA: Diagnosis not present

## 2014-04-16 DIAGNOSIS — M25512 Pain in left shoulder: Secondary | ICD-10-CM

## 2014-04-16 DIAGNOSIS — M542 Cervicalgia: Secondary | ICD-10-CM | POA: Diagnosis not present

## 2014-04-16 DIAGNOSIS — M47812 Spondylosis without myelopathy or radiculopathy, cervical region: Secondary | ICD-10-CM | POA: Diagnosis not present

## 2014-04-16 DIAGNOSIS — R209 Unspecified disturbances of skin sensation: Secondary | ICD-10-CM | POA: Diagnosis not present

## 2014-04-16 NOTE — Patient Instructions (Signed)
Good to see you Ice 20 minutes 2 times daily. Usually after activity and before bed. Exercises 3 times a week.  We tried an injection today.  We will get xray today.  Physical therapy will be calling you See  Me again in 3 weeks.

## 2014-04-16 NOTE — Progress Notes (Signed)
CC: Shoulder pain, left  HPI: Patient is a 68 year old female who is coming in with the complaint of shoulder pain.  Moderate pain again.  Has not been seen for 5 months. Patient states that she is doing very well until this last month. Started having increasing pain and some of the weakness that she had previously. Patient stopped doing the exercises approximate 3 months ago. Not taking any significant medications for this. Does not remember any new injury. 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 Significant soreness as well as a dull throbbing pain at the end of the day. No significant radiation down the arm but does have some mild neck pain that seems to be associated with the pain. Patient is still able to do activities of daily living finds it difficult to reach behind her back or above her head..    Past medical, surgical, family and social history reviewed. Medications reviewed all in the electronic medical record.   Review of Systems: No headache, visual changes, nausea, vomiting, diarrhea, constipation, dizziness, abdominal pain, skin rash, fevers, chills, night sweats, weight loss, swollen lymph nodes, body aches, joint swelling, muscle aches, chest pain, shortness of breath, mood changes.     Objective:    Blood pressure 92/62, pulse 64, height 5\' 2"  (1.575 m), weight 174 lb (78.926 kg), SpO2 97 %.   General: No apparent distress alert and oriented x3 mood and affect normal, dressed appropriately.  HEENT: Pupils equal, extraocular movements intact Respiratory: Patient's speak in full sentences and does not appear short of breath Cardiovascular: No lower extremity edema, non tender, no erythema Skin: Warm dry intact with no signs of infection or rash on extremities or on axial skeleton. Abdomen: Soft nontender Neuro: Cranial nerves II through XII are intact, neurovascularly intact in all extremities with 2+ DTRs and 2+ pulses. Lymph: No  lymphadenopathy of posterior or anterior cervical chain or axillae bilaterally.  Gait normal with good balance and coordination.  MSK: Non tender with full range of motion and good stability and symmetric strength and tone of elbows, wrist, hip, knee and ankles bilaterally.  Shoulder: Left Inspection reveals no abnormalities, the patient does have bruising of the skin of the upper arm as well as near the elbow. Palpation is normal with no tenderness over AC joint or bicipital groove. Range of motion is decreased actively to forward flexion of 100 and internal rotation to the lateral hip. Patient does have full passive range of motion but is extremely painful per patient. Rotator cuff strength unable to test secondary to pain Positive signs of impingement with positive Neer and Hawkin's tests, negative empty can sign. Speeds and Yergason's tests normal. No labral pathology noted with negative Obrien's, negative clunk and good stability. Normal scapular function observed. No painful arc and no drop arm sign. No apprehension sign Contralateral shoulder unremarkable  MSK US performed of: Left shoulder This study was ordered, performed, and interpreted by Charlann Boxer D.O.  Shoulder:   Supraspinatus:  Patient supraspinatus appears to have a partial tear articular surface. Patient also has calcific changes in this area. I nfraspinatus:  Appears normal on long and transverse views. Mild atrophy Subscapularis:  Degenerative changes noted Teres Minor:  Appears normal on long and transverse views. AC joint:  Moderate arthritic changes Glenohumeral Joint:  Moderate arthritic changes Glenoid Labrum:  Posterior labrum does have new degenerative changes not seen previously Biceps Tendon:  Appears normal on long and transverse views, no fraying of  tendon, tendon located in intertubercular groove, no subluxation with shoulder internal or external rotation. No increased power doppler signal. Impression:  Partial tear of rotator cuff, with degenerative changes of the labrum with mild thickening of the capsule  Procedure: Real-time Ultrasound Guided Injection of left glenohumeral joint Device: GE Logiq E  Ultrasound guided injection is preferred based studies that show increased duration, increased effect, greater accuracy, decreased procedural pain, increased response rate with ultrasound guided versus blind injection.  Verbal informed consent obtained.  Time-out conducted.  Noted no overlying erythema, induration, or other signs of local infection.  Skin prepped in a sterile fashion.  Local anesthesia: Topical Ethyl chloride.  With sterile technique and under real time ultrasound guidance:  Joint visualized.  23g 1  inch needle inserted posterior approach. Pictures taken for needle placement. Patient did have injection of 2 cc of 1% lidocaine, 2 cc of 0.5% Marcaine, and 1cc of Kenalog 40 mg/dL. Completed without difficulty  Pain immediately resolved suggesting accurate placement of the medication.  Advised to call if fevers/chills, erythema, induration, drainage, or persistent bleeding.  Images permanently stored and available for review in the ultrasound unit.  Impression: Technically successful ultrasound guided injection.    Impression and Recommendations:     This case required medical decision making of moderate complexity.

## 2014-04-16 NOTE — Assessment & Plan Note (Signed)
Patient 10 months ago did respond very well to a intra-articular injection. Patient does have a partial tear noted as well as some degenerative changes and may be some mild progression of the arthritis that was seen previously. I think there is a questionable adhesive capsulitis that is really occurring as well. Patient was given home exercises. We discussed icing protocol. Patient was referred to formal physical therapy. X-rays ordered today. Patient and will follow-up in 3 weeks. If pain continues to be difficulty we may need to consider advanced imaging but only if patient would consider interventional management. Discussed with patient to monitor blood glucose levels for the next 3 days.  Spent greater than 25 minutes with patient face-to-face and had greater than 50% of counseling including as described above in assessment and plan.

## 2014-04-17 ENCOUNTER — Encounter (HOSPITAL_COMMUNITY): Payer: Medicare Other

## 2014-04-17 ENCOUNTER — Telehealth: Payer: Self-pay | Admitting: Family Medicine

## 2014-04-17 NOTE — Telephone Encounter (Signed)
Discussed with pt

## 2014-04-17 NOTE — Telephone Encounter (Signed)
Pt called stated that Dr. Tamala Julian gave her injection on her shoulder yesterday and it not getting better, pt was wondering what Dr. Tamala Julian wants to do at this point. Pt also want to know the test result as well.

## 2014-04-17 NOTE — Telephone Encounter (Signed)
Xray AC joint arthritis, if still in pain would try to inject that . Can come back next week.

## 2014-04-22 ENCOUNTER — Encounter (HOSPITAL_COMMUNITY): Payer: Medicare Other

## 2014-04-22 ENCOUNTER — Ambulatory Visit: Payer: Medicare Other | Admitting: Pharmacist Clinician (PhC)/ Clinical Pharmacy Specialist

## 2014-04-24 ENCOUNTER — Encounter (HOSPITAL_COMMUNITY): Payer: Medicare Other

## 2014-04-29 ENCOUNTER — Encounter (HOSPITAL_COMMUNITY): Payer: Medicare Other

## 2014-05-01 ENCOUNTER — Encounter (HOSPITAL_COMMUNITY): Payer: Medicare Other

## 2014-05-02 ENCOUNTER — Encounter (HOSPITAL_COMMUNITY): Payer: Self-pay | Admitting: Cardiovascular Disease

## 2014-05-02 DIAGNOSIS — H903 Sensorineural hearing loss, bilateral: Secondary | ICD-10-CM | POA: Diagnosis not present

## 2014-05-06 ENCOUNTER — Ambulatory Visit (INDEPENDENT_AMBULATORY_CARE_PROVIDER_SITE_OTHER): Payer: Medicare Other | Admitting: Pharmacist Clinician (PhC)/ Clinical Pharmacy Specialist

## 2014-05-06 ENCOUNTER — Encounter (HOSPITAL_COMMUNITY): Payer: Medicare Other

## 2014-05-06 DIAGNOSIS — Z7901 Long term (current) use of anticoagulants: Secondary | ICD-10-CM

## 2014-05-06 DIAGNOSIS — Z952 Presence of prosthetic heart valve: Secondary | ICD-10-CM

## 2014-05-06 DIAGNOSIS — Z954 Presence of other heart-valve replacement: Secondary | ICD-10-CM

## 2014-05-06 LAB — POCT INR: INR: 2.4

## 2014-05-08 ENCOUNTER — Encounter (HOSPITAL_COMMUNITY): Payer: Medicare Other

## 2014-05-13 ENCOUNTER — Encounter (HOSPITAL_COMMUNITY): Payer: Medicare Other

## 2014-05-15 ENCOUNTER — Encounter (HOSPITAL_COMMUNITY): Payer: Medicare Other

## 2014-05-20 ENCOUNTER — Encounter (HOSPITAL_COMMUNITY): Payer: Medicare Other

## 2014-05-22 ENCOUNTER — Encounter (HOSPITAL_COMMUNITY): Payer: Medicare Other

## 2014-05-27 ENCOUNTER — Ambulatory Visit (INDEPENDENT_AMBULATORY_CARE_PROVIDER_SITE_OTHER): Payer: Medicare Other | Admitting: Pharmacist Clinician (PhC)/ Clinical Pharmacy Specialist

## 2014-05-27 ENCOUNTER — Other Ambulatory Visit: Payer: Self-pay | Admitting: Internal Medicine

## 2014-05-27 DIAGNOSIS — Z7901 Long term (current) use of anticoagulants: Secondary | ICD-10-CM

## 2014-05-27 DIAGNOSIS — Z952 Presence of prosthetic heart valve: Secondary | ICD-10-CM

## 2014-05-27 DIAGNOSIS — Z954 Presence of other heart-valve replacement: Secondary | ICD-10-CM | POA: Diagnosis not present

## 2014-05-27 LAB — POCT INR: INR: 2.6

## 2014-05-28 ENCOUNTER — Other Ambulatory Visit: Payer: Self-pay | Admitting: Internal Medicine

## 2014-05-29 ENCOUNTER — Other Ambulatory Visit: Payer: Self-pay | Admitting: *Deleted

## 2014-05-29 MED ORDER — CITALOPRAM HYDROBROMIDE 20 MG PO TABS: 20.0000 mg | ORAL_TABLET | Freq: Every day | ORAL | Status: DC

## 2014-05-30 ENCOUNTER — Other Ambulatory Visit: Payer: Self-pay | Admitting: Cardiology

## 2014-06-26 ENCOUNTER — Ambulatory Visit: Payer: Medicare Other | Admitting: Pharmacist Clinician (PhC)/ Clinical Pharmacy Specialist

## 2014-07-09 ENCOUNTER — Encounter: Payer: Self-pay | Admitting: Internal Medicine

## 2014-07-09 ENCOUNTER — Ambulatory Visit (INDEPENDENT_AMBULATORY_CARE_PROVIDER_SITE_OTHER): Payer: Medicare Other | Admitting: Internal Medicine

## 2014-07-09 VITALS — BP 144/70 | HR 72 | Temp 98.8°F | Resp 14 | Ht 62.0 in | Wt 179.0 lb

## 2014-07-09 DIAGNOSIS — M5126 Other intervertebral disc displacement, lumbar region: Secondary | ICD-10-CM | POA: Diagnosis not present

## 2014-07-09 DIAGNOSIS — M75102 Unspecified rotator cuff tear or rupture of left shoulder, not specified as traumatic: Secondary | ICD-10-CM

## 2014-07-09 MED ORDER — GABAPENTIN 300 MG PO CAPS: 300.0000 mg | ORAL_CAPSULE | Freq: Three times a day (TID) | ORAL | Status: DC

## 2014-07-09 NOTE — Patient Instructions (Signed)
We are going to try increasing your gabapentin. The for the 2-3 days add a morning dose of the gabapentin. If you are still having pain after this you can increase the gabapentin to 3 times a day.   This is the medicine that I think is likely to help with your pain the most. You can stop taking the flexeril to see if you notice any difference. If it is not helping we do not need it.  Come back in about 1 month so that we can check on the pain and make more adjustments if needed.   Go back to see Dr. Tamala Julian about your shoulder to see if we can do anything about that pain.

## 2014-07-09 NOTE — Progress Notes (Signed)
Pre visit review using our clinic review tool, if applicable. No additional management support is needed unless otherwise documented below in the visit note. 

## 2014-07-11 ENCOUNTER — Telehealth: Payer: Self-pay | Admitting: Cardiology

## 2014-07-11 NOTE — Telephone Encounter (Signed)
Pt dropped off combined [employer] paper Attending Physicians statement on the back of paper needs to be completed sent to [employer]  For completion.

## 2014-07-12 NOTE — Assessment & Plan Note (Signed)
Advised her to return to sports medicine and she did get partial relief from injection in November but did not follow up. Not doing any exercises for the shoulder and still with pain.

## 2014-07-12 NOTE — Progress Notes (Signed)
   Subjective:    Patient ID: Abigail Oliver, female    DOB: 14-May-1946, 69 y.o.   MRN: [id number]  HPI The patient is a 69 YO female who is here to follow up on her chronic back pain. She has been struggling with it for years and it has been getting worse lately. She rates the pain as intermittent and at its worse it is 10/10. On a good day she is about 3-4/10. It interferes with her life and makes her not able to do activities. She denies associated weight loss, fevers, incontinence. She does have some stable neuropathy from her diabetes but no new numbness.   Review of Systems  Constitutional: Negative for fever, diaphoresis, activity change, appetite change and fatigue.  HENT: Negative.   Eyes: Negative.   Respiratory: Negative for cough, chest tightness, shortness of breath and wheezing.   Cardiovascular: Negative for chest pain, palpitations and leg swelling.  Gastrointestinal: Negative for abdominal pain, diarrhea, constipation and abdominal distention.  Endocrine: Negative.   Musculoskeletal: Positive for back pain and arthralgias. Negative for myalgias and gait problem.  Skin: Negative.   Neurological: Negative for dizziness, syncope, weakness, light-headedness and headaches.      Objective:   Physical Exam  Constitutional: She is oriented to person, place, and time. She appears well-developed and well-nourished. No distress.  HENT:  Head: Normocephalic and atraumatic.  Eyes: EOM are normal.  Neck: Normal range of motion.  Cardiovascular: Normal rate.   Artificial valve click noted  Pulmonary/Chest: Effort normal and breath sounds normal. No respiratory distress. She has no wheezes. She has no rales.  Abdominal: Soft. Bowel sounds are normal. She exhibits no distension. There is no tenderness. There is no rebound.  Neurological: She is alert and oriented to person, place, and time. Coordination normal.  Skin: Skin is warm and dry.   Filed Vitals:   07/09/14 1306  BP:  144/70  Pulse: 72  Temp: 98.8 F (37.1 C)  TempSrc: Oral  Resp: 14  Height: 5\' 2"  (1.575 m)  Weight: 179 lb (81.194 kg)  SpO2: 95%      Assessment & Plan:

## 2014-07-12 NOTE — Assessment & Plan Note (Signed)
Increase gabapentin for better pain control over the next 1-2 weeks to final dose of 300 mg TID. She can stop flexeril if it is not helping. She was not sure at our visit.

## 2014-07-22 ENCOUNTER — Ambulatory Visit (INDEPENDENT_AMBULATORY_CARE_PROVIDER_SITE_OTHER): Payer: Medicare Other | Admitting: Pharmacist Clinician (PhC)/ Clinical Pharmacy Specialist

## 2014-07-22 ENCOUNTER — Other Ambulatory Visit: Payer: Self-pay | Admitting: Cardiology

## 2014-07-22 DIAGNOSIS — Z952 Presence of prosthetic heart valve: Secondary | ICD-10-CM

## 2014-07-22 DIAGNOSIS — Z7901 Long term (current) use of anticoagulants: Secondary | ICD-10-CM | POA: Diagnosis not present

## 2014-07-22 DIAGNOSIS — Z954 Presence of other heart-valve replacement: Secondary | ICD-10-CM | POA: Diagnosis not present

## 2014-07-22 LAB — POCT INR: INR: 1.1

## 2014-07-23 ENCOUNTER — Other Ambulatory Visit: Payer: Self-pay | Admitting: Geriatric Medicine

## 2014-07-23 MED ORDER — FOLIC ACID 1 MG PO TABS: 1.0000 mg | ORAL_TABLET | Freq: Every day | ORAL | Status: DC

## 2014-07-25 ENCOUNTER — Other Ambulatory Visit: Payer: Self-pay | Admitting: Internal Medicine

## 2014-07-25 ENCOUNTER — Other Ambulatory Visit: Payer: Self-pay | Admitting: Cardiology

## 2014-07-26 ENCOUNTER — Other Ambulatory Visit: Payer: Self-pay | Admitting: Physician Assistant

## 2014-07-31 ENCOUNTER — Other Ambulatory Visit: Payer: Self-pay | Admitting: *Deleted

## 2014-07-31 MED ORDER — SIMVASTATIN 40 MG PO TABS: 40.0000 mg | ORAL_TABLET | Freq: Every day | ORAL | Status: DC

## 2014-08-01 DIAGNOSIS — Z1389 Encounter for screening for other disorder: Secondary | ICD-10-CM | POA: Diagnosis not present

## 2014-08-01 DIAGNOSIS — Z23 Encounter for immunization: Secondary | ICD-10-CM | POA: Diagnosis not present

## 2014-08-01 DIAGNOSIS — E1142 Type 2 diabetes mellitus with diabetic polyneuropathy: Secondary | ICD-10-CM | POA: Diagnosis not present

## 2014-08-05 ENCOUNTER — Ambulatory Visit (INDEPENDENT_AMBULATORY_CARE_PROVIDER_SITE_OTHER): Payer: Medicare Other | Admitting: Pharmacist Clinician (PhC)/ Clinical Pharmacy Specialist

## 2014-08-05 DIAGNOSIS — Z954 Presence of other heart-valve replacement: Secondary | ICD-10-CM | POA: Diagnosis not present

## 2014-08-05 DIAGNOSIS — Z7901 Long term (current) use of anticoagulants: Secondary | ICD-10-CM | POA: Diagnosis not present

## 2014-08-05 DIAGNOSIS — Z952 Presence of prosthetic heart valve: Secondary | ICD-10-CM

## 2014-08-05 LAB — POCT INR: INR: 5.3

## 2014-08-07 ENCOUNTER — Other Ambulatory Visit: Payer: Self-pay | Admitting: Physician Assistant

## 2014-08-14 ENCOUNTER — Ambulatory Visit: Payer: Medicare Other | Admitting: Pharmacist Clinician (PhC)/ Clinical Pharmacy Specialist

## 2014-08-16 ENCOUNTER — Other Ambulatory Visit: Payer: Self-pay | Admitting: Pharmacist Clinician (PhC)/ Clinical Pharmacy Specialist

## 2014-08-16 NOTE — Telephone Encounter (Signed)
Rx(s) sent to pharmacy electronically.  

## 2014-08-28 ENCOUNTER — Ambulatory Visit (INDEPENDENT_AMBULATORY_CARE_PROVIDER_SITE_OTHER): Payer: Medicare Other | Admitting: Pharmacist Clinician (PhC)/ Clinical Pharmacy Specialist

## 2014-08-28 DIAGNOSIS — Z954 Presence of other heart-valve replacement: Secondary | ICD-10-CM

## 2014-08-28 DIAGNOSIS — Z7901 Long term (current) use of anticoagulants: Secondary | ICD-10-CM

## 2014-08-28 DIAGNOSIS — Z952 Presence of prosthetic heart valve: Secondary | ICD-10-CM

## 2014-08-28 LAB — POCT INR: INR: 2.9

## 2014-08-29 ENCOUNTER — Encounter: Payer: Self-pay | Admitting: Family

## 2014-08-29 ENCOUNTER — Other Ambulatory Visit (INDEPENDENT_AMBULATORY_CARE_PROVIDER_SITE_OTHER): Payer: Medicare Other

## 2014-08-29 ENCOUNTER — Ambulatory Visit (INDEPENDENT_AMBULATORY_CARE_PROVIDER_SITE_OTHER): Payer: Medicare Other | Admitting: Family

## 2014-08-29 ENCOUNTER — Ambulatory Visit (INDEPENDENT_AMBULATORY_CARE_PROVIDER_SITE_OTHER)
Admission: RE | Admit: 2014-08-29 | Discharge: 2014-08-29 | Disposition: A | Discharge status: Home / Self Care | Payer: Medicare Other | Source: Ambulatory Visit | Attending: Family | Admitting: Family

## 2014-08-29 ENCOUNTER — Telehealth: Payer: Self-pay | Admitting: Family

## 2014-08-29 VITALS — BP 128/60 | HR 79 | Temp 98.3°F | Resp 18 | Ht 62.0 in | Wt 182.6 lb

## 2014-08-29 DIAGNOSIS — R059 Cough, unspecified: Secondary | ICD-10-CM

## 2014-08-29 DIAGNOSIS — R05 Cough: Secondary | ICD-10-CM

## 2014-08-29 DIAGNOSIS — R0602 Shortness of breath: Secondary | ICD-10-CM | POA: Diagnosis not present

## 2014-08-29 LAB — BASIC METABOLIC PANEL
BUN: 23 mg/dL (ref 6–23)
CO2: 31 mEq/L (ref 19–32)
Calcium: 10 mg/dL (ref 8.4–10.5)
Chloride: 101 mEq/L (ref 96–112)
Creatinine, Ser: 0.96 mg/dL (ref 0.40–1.20)
GFR: 61.25 mL/min (ref >60.00)
Glucose, Bld: 111 mg/dL — ABNORMAL HIGH (ref 70–99)
Potassium: 3.8 mEq/L (ref 3.5–5.1)
Sodium: 140 mEq/L (ref 135–145)

## 2014-08-29 MED ORDER — CEFUROXIME AXETIL 250 MG PO TABS: 250.0000 mg | ORAL_TABLET | Freq: Two times a day (BID) | ORAL | Status: DC

## 2014-08-29 NOTE — Assessment & Plan Note (Signed)
Symptoms and exam consistent with potential heart failure exacerbation, however cannot rule out underlying upper respiratory or infectious process. Obtain chest x-ray. Obtain BMET to check electrolyte status. Pending lab results, increase torsemide to 60 mg in the morning x 2 days and 40 mg in the evenings as previously prescribed. Patient provided prescription for Ceftin if no relief from heart failure treatment. Patient instructed to continue to monitor weight on a daily basis. For cough relief recommend OTC Delsym or Robutussin. Follow up or seek emergency care if symptoms worsen or fail to improve.

## 2014-08-29 NOTE — Patient Instructions (Addendum)
Thank you for choosing [facility].  Summary/Instructions:  Please increase your toresimide to 3 pills in the morning and 2 pills in the evening for the next 2-3 days.    Please stop by the lab on the basement level of the building for your blood work. Your results will be released to [place] (or called to you) after review, usually within 72 hours after test completion. If any changes need to be made, you will be notified at that same time.  Please stop by radiology on the basement level of the building for your x-rays. Your results will be released to [place] (or called to you) after review, usually within 72 hours after test completion. If any treatments or changes are necessary, you will be notified at that same time.  If your symptoms worsen or fail to improve, please contact our office for further instruction, or in case of emergency go directly to the emergency room at the closest medical facility.

## 2014-08-29 NOTE — Progress Notes (Signed)
Pre visit review using our clinic review tool, if applicable. No additional management support is needed unless otherwise documented below in the visit note. 

## 2014-08-29 NOTE — Telephone Encounter (Signed)
Please inform patient her basic metabolic panel and electrolytes were all within normal limits. Therefore she may proceed with taking an extra torsemide today if she would like and if not start tomorrow. We are still awaiting the results of her x-ray.

## 2014-08-29 NOTE — Progress Notes (Signed)
Subjective:    Patient ID: Abigail Oliver, female    DOB: 08/01/1945, 69 y.o.   MRN: [id number]  Chief Complaint  Patient presents with  . Cough    Having a cough that is starting to hurt her chest, she can not lay flat, describes it as dry hacking, and when it starts its consistent, has not been able to sleep the last 3 nights, x5 days     HPI:  Abigail Oliver is a 69 y.o. female who presents today for an acute visit.   This is a new problem. Associated symptom of a cough that is described as dry and hacking has been going on for the last 3 nights. Indicates that she cannot lie flat which exacerbates her symptoms. Being seated improves this. Has not tried any OTC medications or denies other modifying factors. Indicates that her weight is up about 3.5 lbs from Tuesday.    Allergies  Allergen Reactions  . Atorvastatin     Muscle pain  . Simvastatin Other (See Comments)    Muscle pain  . Sulfa Antibiotics   . Iodinated Diagnostic Agents Rash     Red rash after cardiac cath 1 wk ago, ? Contrast allergy, requires 13 hr prep now per [email or web address]    Current Outpatient Prescriptions on File Prior to Visit  Medication Sig Dispense Refill  . Ascorbic Acid (VITAMIN C) 500 MG tablet Take 500 mg by mouth 2 (two) times daily.     . calcium-vitamin D (CALCIUM 500+D) 500-200 MG-UNIT per tablet Take 1 tablet by mouth 2 (two) times daily.     . citalopram (CELEXA) 20 MG tablet Take 1 tablet (20 mg total) by mouth at bedtime. 30 tablet 5  . clopidogrel (PLAVIX) 75 MG tablet Take 1 tablet (75 mg total) by mouth daily with breakfast. 30 tablet 11  . ferrous sulfate 325 (65 FE) MG tablet Take 1 tablet (325 mg total) by mouth daily. 30 tablet 0  . folic acid (FOLVITE) 1 MG tablet Take 1 tablet (1 mg total) by mouth daily. 30 tablet 4  . gabapentin (NEURONTIN) 300 MG capsule Take 1 capsule (300 mg total) by mouth 3 (three) times daily. 90 capsule 3  . KLOR-CON M20 20 MEQ tablet Take  20 mEq by mouth 3 (three) times daily.     Marland Kitchen latanoprost (XALATAN) 0.005 % ophthalmic solution PLACE 1 DROP INTO BOTH EYES AT BEDTIME. 2.5 mL 2  . metFORMIN (GLUCOPHAGE) 1000 MG tablet Take 1 tablet (1,000 mg total) by mouth 2 (two) times daily with a meal. 60 tablet 12  . metoprolol succinate (TOPROL-XL) 50 MG 24 hr tablet TAKE 1 TABLET (50 MG TOTAL) BY MOUTH DAILY. **PATIENT NEEDS AN APPOINTMENT** 60 tablet 0  . nitroGLYCERIN (NITROSTAT) 0.4 MG SL tablet Place 1 tablet (0.4 mg total) under the tongue every 5 (five) minutes as needed for chest pain. 25 tablet 3  . pantoprazole (PROTONIX) 40 MG tablet Take 40 mg by mouth 2 (two) times daily as needed.    . simvastatin (ZOCOR) 40 MG tablet Take 1 tablet (40 mg total) by mouth daily at 6 PM. 30 tablet 0  . torsemide (DEMADEX) 20 MG tablet TAKE TWO TABLETS BY MOUTH TWICE DAILY 120 tablet 0  . traMADol (ULTRAM) 50 MG tablet Take 1 tablet (50 mg total) by mouth 2 (two) times daily. 60 tablet 3  . traZODone (DESYREL) 50 MG tablet Take 0.5-1 tablets (25-50 mg total) by mouth at bedtime as needed  for sleep. 30 tablet 3  . warfarin (COUMADIN) 3 MG tablet TAKE 1 TO 1 AND 1/2 TABLET(S) BY MOUTH DAILY AS DIRECTED BY  COUMADIN  CLINIC 45 tablet 3   No current facility-administered medications on file prior to visit.    Past Medical History  Diagnosis Date  . Coronary atherosclerosis of native coronary artery   . Hyperlipidemia   . Chronic angle-closure glaucoma(365.23)   . Aortic valve disorders   . Peripheral vascular disease   . Type II diabetes mellitus   . Depression   . PONV (postoperative nausea and vomiting)     N&V  . CHF (congestive heart failure)     diastolic heart failure  . GERD (gastroesophageal reflux disease)   . Neuromuscular disorder     neuropathy  . Arthritis     osteo  . Anemia   . Syncope 03/29/2012    carotid doppler - R ICA 50-69% reduction by velocities (low end); L ICA 0-49% reduction by velocities (low end); R and L  subclavian arteries - <50% redcution; R and L vertebral arteries show normal antegrade flow  . Dizziness 02/09/2012    Holter monitor - sinus rhythm, no ventricular ectopic beats, no sulpraventricular ectopic beats noted  . OSA (obstructive sleep apnea)   . Abnormal stress test, 7/15, + ischemia 12/15/2013  . S/P arterial stent-to LAD, Promus DES 12/14/13 12/15/2013     Review of Systems  Constitutional: Negative for fever and chills.  Respiratory: Positive for cough. Negative for chest tightness.   Cardiovascular: Positive for leg swelling. Negative for chest pain and palpitations.      Objective:    BP 128/60 mmHg  Pulse 79  Temp(Src) 98.3 F (36.8 C) (Oral)  Resp 18  Ht 5\' 2"  (1.575 m)  Wt 182 lb 9.6 oz (82.827 kg)  BMI 33.39 kg/m2  SpO2 96% Nursing note and vital signs reviewed.  Physical Exam  Constitutional: She is oriented to person, place, and time. She appears well-developed and well-nourished. No distress.  HENT:  Right Ear: Hearing, tympanic membrane, external ear and ear canal normal.  Left Ear: Hearing, tympanic membrane, external ear and ear canal normal.  Nose: Nose normal. Right sinus exhibits no maxillary sinus tenderness and no frontal sinus tenderness. Left sinus exhibits no maxillary sinus tenderness and no frontal sinus tenderness.  Mouth/Throat: Uvula is midline, oropharynx is clear and moist and mucous membranes are normal.  Neck: Neck supple.  Cardiovascular: Normal rate, regular rhythm and intact distal pulses.   Mechanical click noted upon auscultation. Mild amount of non-pitting edema note in bilateral ankles.   Pulmonary/Chest: Effort normal and breath sounds normal.  Lymphadenopathy:    She has no cervical adenopathy.  Neurological: She is alert and oriented to person, place, and time.  Skin: Skin is warm and dry.  Psychiatric: She has a normal mood and affect. Her behavior is normal. Judgment and thought content normal.       Assessment & Plan:

## 2014-08-30 ENCOUNTER — Telehealth: Payer: Self-pay | Admitting: *Deleted

## 2014-08-30 ENCOUNTER — Encounter: Payer: Self-pay | Admitting: Family

## 2014-08-30 NOTE — Telephone Encounter (Signed)
[place] Night - Client [facility] Patient Name: Abigail Oliver Gender: Female DOB: [birth year] Age: 69 Y 11 M 26 D Return Phone Number: [phone] (Primary) Address: [address] City/State/Zip: [place] [address] Client [facility] Night - Client Client Site [place] - Night Physician [place], [place] Type Call Call Type Triage / [place] Name c Relationship To Patient Care Giver Return Phone Number [phone] (Primary) Chief Complaint Medication Question (non symptomatic) Initial Comment Caller states she was told to take a extra water pill contingent on chest xray; md was to call her back but did not. Caller is wanting to know if she should proceed with the Doctors instructions.ncaller. Pt was told this by NP Belenda Cruise Colon Nurse Assessment Nurse: Jimmye Norman, RN, [place] Date/Time (Eastern Time): 08/29/2014 10:05:15 PM Confirm and document reason for call. If symptomatic, describe symptoms. ---Caller states she was told to take an extra water pill pending xray and never heard back, so she is wondering whether she should take it or not. RN instructed caller to take the pill as regularly directed until she can get the results of the xray in the morning. Caller verbalized understanding, is not currently having any symptoms that need to be addressed. Has the patient traveled out of the country within the last 30 days? ---Not Applicable Does the patient require triage? ---Declined Triage Guidelines Guideline Title Affirmed Question Affirmed Notes Nurse Date/Time ([place] Time) Disp. Time Eilene Ghazi Time) Disposition Final User 08/29/2014 10:08:43 PM Clinical Call Yes Jimmye Norman, RN, [place] After Care Instructions Given Call Event Type User Date / Time Description

## 2014-08-30 NOTE — Telephone Encounter (Signed)
Patient is requesting call back with x ray and lab results.

## 2014-08-30 NOTE — Telephone Encounter (Signed)
Pt aware of blood work results. Please review the xrays as soon as you can. Pt would like a call back today with the xray results.

## 2014-08-30 NOTE — Telephone Encounter (Signed)
Spoke with patient via phone and discussed x-ray results. Instructed to continue Torosemide trial and if no improvement start antibiotic.

## 2014-08-30 NOTE — Telephone Encounter (Signed)
X-ray results were sent to MyChart.

## 2014-09-01 ENCOUNTER — Other Ambulatory Visit: Payer: Self-pay | Admitting: Cardiology

## 2014-09-02 ENCOUNTER — Other Ambulatory Visit: Payer: Self-pay | Admitting: Physician Assistant

## 2014-09-02 ENCOUNTER — Other Ambulatory Visit: Payer: Self-pay | Admitting: Cardiology

## 2014-09-04 ENCOUNTER — Other Ambulatory Visit: Payer: Self-pay | Admitting: Cardiology

## 2014-09-16 ENCOUNTER — Ambulatory Visit (INDEPENDENT_AMBULATORY_CARE_PROVIDER_SITE_OTHER): Payer: Medicare Other | Admitting: Pharmacist Clinician (PhC)/ Clinical Pharmacy Specialist

## 2014-09-16 DIAGNOSIS — Z7901 Long term (current) use of anticoagulants: Secondary | ICD-10-CM | POA: Diagnosis not present

## 2014-09-16 DIAGNOSIS — Z954 Presence of other heart-valve replacement: Secondary | ICD-10-CM | POA: Diagnosis not present

## 2014-09-16 DIAGNOSIS — Z952 Presence of prosthetic heart valve: Secondary | ICD-10-CM

## 2014-09-16 LAB — POCT INR: INR: 2.5

## 2014-09-19 ENCOUNTER — Ambulatory Visit (INDEPENDENT_AMBULATORY_CARE_PROVIDER_SITE_OTHER): Payer: Medicare Other | Admitting: Cardiology

## 2014-09-19 ENCOUNTER — Encounter: Payer: Self-pay | Admitting: Cardiology

## 2014-09-19 ENCOUNTER — Encounter: Payer: Self-pay | Admitting: *Deleted

## 2014-09-19 VITALS — BP 100/52 | HR 77 | Ht 62.0 in | Wt 181.0 lb

## 2014-09-19 DIAGNOSIS — I739 Peripheral vascular disease, unspecified: Secondary | ICD-10-CM | POA: Diagnosis not present

## 2014-09-19 DIAGNOSIS — I2510 Atherosclerotic heart disease of native coronary artery without angina pectoris: Secondary | ICD-10-CM

## 2014-09-19 DIAGNOSIS — I5032 Chronic diastolic (congestive) heart failure: Secondary | ICD-10-CM | POA: Diagnosis not present

## 2014-09-19 DIAGNOSIS — I359 Nonrheumatic aortic valve disorder, unspecified: Secondary | ICD-10-CM

## 2014-09-19 DIAGNOSIS — I6523 Occlusion and stenosis of bilateral carotid arteries: Secondary | ICD-10-CM | POA: Diagnosis not present

## 2014-09-19 MED ORDER — TORSEMIDE 20 MG PO TABS: ORAL_TABLET | ORAL | Status: DC

## 2014-09-19 MED ORDER — POTASSIUM CHLORIDE CRYS ER 20 MEQ PO TBCR: EXTENDED_RELEASE_TABLET | ORAL | Status: DC

## 2014-09-19 MED ORDER — PANTOPRAZOLE SODIUM 40 MG PO TBEC: 40.0000 mg | DELAYED_RELEASE_TABLET | Freq: Every day | ORAL | Status: DC

## 2014-09-19 NOTE — Patient Instructions (Signed)
Medication Instructions:  Increase torsemide to 60mg  in the AM and 40mg  in the PM. This will be 3 of your 20mg  tablets in the AM and 2 of your 20mg  tablets in the PM.  Increase KCL (potassium) to 40 mEq two times a day. This will be 2 of your 20 mEq tablets two times a day.  Start Crestor 10mg  daily.   Try protonix 40mg  daily for a week to see if this helps your cough. If it helps your cough you should continue taking it. If it does not help your cough you can stop taking it.   You can stop Plavix in August 2016. When you stop taking Plavix your should start aspirin 81mg  daily.  Labwork: BMET/BNP/CBCd today. Your physician recommends that you return for lab work in: 2 weeks--BMET. Your physician recommends that you return for a FASTING lipid profile /liver profile in 2 months.     Testing/Procedures: Your physician has requested that you have a carotid duplex. This test is an ultrasound of the carotid arteries in your neck. It looks at blood flow through these arteries that supply the brain with blood. Allow one hour for this exam. There are no restrictions or special instructions. July 2016  Follow-Up: Your physician recommends that you schedule a follow-up appointment in: 2 months with Dr [name].

## 2014-09-19 NOTE — Progress Notes (Signed)
Patient ID: Abigail Oliver, female   DOB: Nov 18, 1945, 69 y.o.   MRN: [id number] PCP: Dr. Doug Sou  69 yo with history of aortic stenosis s/p St Jude mechanical AVR (6/00) and CAD s/p single vessel CABG (6/00) presents for cardiology evaluation.  She had severe aortic stenosis and therefore required CABG-AVR in 6/00.  She had a RIMA-RCA. She had a myoview in 4/13 with no ischemia or infarction, and echo in 6/14 showed normal EF with moderate diastolic dysfunction and a well-seated mechanical valve.    In 6/15, torsemide was stopped and she was begun on Lasix due to increased cost of torsemide.  She developed dyspnea and lower extremity edema.  She was admitted later that month with community-acquired PNA as well as CHF.  CT showed RLL PNA and multiple pulmonary nodules.  Echo in 6/15 showed EF 60-65%, mechanical aortic valve with mean gradient 27 mmHg. After discharge, she continued to be short of breath.  Lasix was increased to 80 mg bid.  This did not help much.  She was short of breath walking around in her yard.  No chest pain.  Lexiscan Cardiolite was done in 7/15 given the exertional symptoms and showed a small area of basal inferolateral ischemia.   Given ongoing exertional symptoms, I did a right and left heart catheterization in 7/15.  Right and left heart filling pressures were normal.  There was an 80% stenosis in the proximal LAD that was hemodynamically significant by FFR.  Patient had DES to proximal LAD.    Since PCI, she has felt better.  Currently, she gets short of breath after walking about 100 feet.  She has had problems with a chronic cough for 6-8 weeks. The cough is worse when she lies down in bed so she has been sleeping propped up.  No chest pain.  She is working Chief of Staff).  No BRBPR or melena.  She is having more trouble with her back pain. She saw her PCP recently and had a CXR, that was relatively unremarkable.  Torsemide was increased to 60 qam/40 qpm for a couple of days then  decreased back to 40 bid.  She stopped simvastatin due to myalgias/stiffness, and this is now better.    Labs (6/14): BNP 216, HCT 32, K 4.8, creatinine 0.8 Labs (8/14): K 4.4, creatinine 0.8, BNP 55, HDL 45, LDL 141 Labs (10/14): LDL 61, HDL 43, AST 40, ALT 31 Labs (6/15): BNP 37 Labs (7/15): K 4.4, creatinine 0.8 => 0.77 Labs (9/15): HCT 34.5, LDL 32, HDL 40 Labs (4/16): K 3.8, creatinine 0.96  ECG: NSR, nonspecific T wave flattening  PMH: 1. Aortic stenosis: Now has St Jude mechanical aortic valve (6/00).  Echo (6/14) with EF 60-65%, mild LVH, moderate diastolic dysfunction, normal RV, mechanical aortic valve with mean gradient 20 mmHg.  Echo (6/15) with EF 60-65%, mechanical aortic valve with mean gradient 27 mmHg.  2. CAD: CABG at time of AVR in 6/00 with RIMA-RCA.  Myoview in 4/13 with EF 72%, no ischemia or infarction. Lexiscan Cardiolite (7/15) with EF 69%, small area of basal inferolateral ischemia.  LHC (7/15) with patent RIMA-RCA, 80% mRCA, 80% pLAD with FFR 0.73, treated with DES to pLAD.  3. HTN 4. Type II diabetes 5. GERD 6. OSA 7. Hyperlipidemia 8. PAD: Right SFA stent. Peripheral arterial dopplers (7/15) with patent right SFA stent.  9. Colon polyps 10. Carotid stenosis: Carotid dopplers (11/13) witih 50-69% RICA.  Carotid dopplers (7/15) with 40-59% RICA.  11. Diastolic CHF 12.  Gastritis 13. Dizziness: Holter (8/14) with few PACs, otherwise unremarkable.  14. Mild transaminase elevation 15. Pulmonary nodules: Noted by CT in 6/15.  CT chest (8/15) was thought to indicate that nodules were benign.  No further workup was recommended.  16. Contrast allergy 17. Diastolic CHF: RHC (5/02) with mean RA 2, PA 22/11, mean PCWP 9, CI 3.79 18. Low back pain.   SH: Married, works as Theme park manager, nonsmoker.   FH: CAD  ROS: All systems reviewed and negative except as per HPI.   Current Outpatient Prescriptions  Medication Sig Dispense Refill  . Ascorbic Acid (VITAMIN C) 500  MG tablet Take 500 mg by mouth 2 (two) times daily.     . calcium-vitamin D (CALCIUM 500+D) 500-200 MG-UNIT per tablet Take 1 tablet by mouth 2 (two) times daily.     . citalopram (CELEXA) 20 MG tablet Take 1 tablet (20 mg total) by mouth at bedtime. 30 tablet 5  . clopidogrel (PLAVIX) 75 MG tablet Take 1 tablet (75 mg total) by mouth daily with breakfast. 30 tablet 11  . ferrous sulfate 325 (65 FE) MG tablet Take 1 tablet (325 mg total) by mouth daily. 30 tablet 0  . folic acid (FOLVITE) 1 MG tablet Take 1 tablet (1 mg total) by mouth daily. 30 tablet 4  . gabapentin (NEURONTIN) 300 MG capsule Take 1 capsule (300 mg total) by mouth 3 (three) times daily. 90 capsule 3  . latanoprost (XALATAN) 0.005 % ophthalmic solution PLACE 1 DROP INTO BOTH EYES AT BEDTIME. 2.5 mL 2  . metFORMIN (GLUCOPHAGE) 1000 MG tablet Take 1 tablet (1,000 mg total) by mouth 2 (two) times daily with a meal. 60 tablet 12  . metoprolol succinate (TOPROL-XL) 50 MG 24 hr tablet TAKE 1 TABLET (50 MG TOTAL) BY MOUTH DAILY. **PATIENT NEEDS AN APPOINTMENT** 60 tablet 0  . nitroGLYCERIN (NITROSTAT) 0.4 MG SL tablet Place 1 tablet (0.4 mg total) under the tongue every 5 (five) minutes as needed for chest pain. 25 tablet 3  . traMADol (ULTRAM) 50 MG tablet Take 1 tablet (50 mg total) by mouth 2 (two) times daily. 60 tablet 3  . traZODone (DESYREL) 50 MG tablet Take 0.5-1 tablets (25-50 mg total) by mouth at bedtime as needed for sleep. 30 tablet 3  . warfarin (COUMADIN) 3 MG tablet TAKE 1 TO 1 AND 1/2 TABLET(S) BY MOUTH DAILY AS DIRECTED BY  COUMADIN  CLINIC 45 tablet 3  . pantoprazole (PROTONIX) 40 MG tablet Take 1 tablet (40 mg total) by mouth daily. 90 tablet 3  . potassium chloride SA (K-DUR,KLOR-CON) 20 MEQ tablet 2 tablets (40 mEq) by mouth two times a day 360 tablet 3  . torsemide (DEMADEX) 20 MG tablet 3 tablets (60mg ) by mouth in the AM and 2 tablets (40mg ) by mouth in the PM 450 tablet 3   No current facility-administered  medications for this visit.    BP 100/52 mmHg  Pulse 77  Ht 5\' 2"  (1.575 m)  Wt 181 lb (82.101 kg)  BMI 33.10 kg/m2 General: NAD Neck: JVP 8 cm, no thyromegaly or thyroid nodule.  Lungs: Clear to auscultation bilaterally with normal respiratory effort. CV: Nondisplaced PMI.  Heart regular S1/S2, mechanical S2, no S3/S4, 1/6 early SEM RUSB.  No ankle edema.  No carotid bruit.  Normal pedal pulses.  Abdomen: Soft, nontender, no hepatosplenomegaly, mild distention.  Neurologic: Alert and oriented x 3.  Psych: Normal affect. Extremities: No clubbing or cyanosis.    Assessment/Plan: 1. St Jude  mechanical aortic valve:  Mildly elevated mean gradient for this valve on echo in 6/15.  Continue coumadin. CBC today.  2. Chronic diastolic CHF: NYHA class III symptoms, this seems a bit worse.  She has a chronic cough, especially when lying down.  Cannot rule out volume overload as a cause of the cough.  - Check BMET/BNP today.   - Increase torsemide to 60 qam, 40 qpm.  Repeat BMET in 2 wks.  - Increase KCl to 40 bid.  3. CAD: s/p RIMA-RCA at time of AVR.  She developed exertional dyspnea and had cath showing significant proximal LAD stenosis in 7/15 that was treated with BMS.  She feels much better since PCI.  - She will continue Plavix + warfarin for 1 year (to 8/16), then go back to ASA 81 + warfarin.  - Restart statin.  4. Hyperlipidemia: Myalgias with atorvastatin.  I will have her start Crestor 10 mg daily with lipids/LFTs in 2 months.  She took Crestor in the past without problems.  5. Carotid stenosis: Stable, repeat 7/16.  6. PAD: Known PAD with right SFA stent.  Last peripheral arterial dopplers showed patent R SFA stent.   7. Pulmonary nodules: Had CT in 8/15, nodules thought to be benign.   8. Chronic cough: x 6-8 weeks, worse with lying down.  May be volume related, but also could be due to GERD.  As above, increasing torsemide.  I will also have her start Protonix 40 mg daily.    Loralie Champagne 09/19/2014

## 2014-09-20 LAB — CBC WITH DIFFERENTIAL/PLATELET
Basophils Absolute: 0 10*3/uL (ref 0.0–0.1)
Basophils Relative: 0.6 % (ref 0.0–3.0)
Eosinophils Absolute: 0.1 10*3/uL (ref 0.0–0.7)
Eosinophils Relative: 1.7 % (ref 0.0–5.0)
HCT: 35.5 % — ABNORMAL LOW (ref 36.0–46.0)
Hemoglobin: 12.1 g/dL (ref 12.0–15.0)
Lymphocytes Relative: 35.6 % (ref 12.0–46.0)
Lymphs Abs: 2.8 10*3/uL (ref 0.7–4.0)
MCHC: 34.2 g/dL (ref 30.0–36.0)
MCV: 85.7 fl (ref 78.0–100.0)
Monocytes Absolute: 0.7 10*3/uL (ref 0.1–1.0)
Monocytes Relative: 9.3 % (ref 3.0–12.0)
Neutro Abs: 4.2 10*3/uL (ref 1.4–7.7)
Neutrophils Relative %: 52.8 % (ref 43.0–77.0)
Platelets: 237 10*3/uL (ref 150.0–400.0)
RBC: 4.14 Mil/uL (ref 3.87–5.11)
RDW: 16 % — ABNORMAL HIGH (ref 11.5–15.5)
WBC: 7.9 10*3/uL (ref 4.0–10.5)

## 2014-09-20 LAB — BASIC METABOLIC PANEL
BUN: 28 mg/dL — ABNORMAL HIGH (ref 6–23)
CO2: 27 mEq/L (ref 19–32)
Calcium: 10.3 mg/dL (ref 8.4–10.5)
Chloride: 100 mEq/L (ref 96–112)
Creatinine, Ser: 1.16 mg/dL (ref 0.40–1.20)
GFR: 49.23 mL/min — ABNORMAL LOW (ref >60.00)
Glucose, Bld: 97 mg/dL (ref 70–99)
Potassium: 4.1 mEq/L (ref 3.5–5.1)
Sodium: 138 mEq/L (ref 135–145)

## 2014-09-20 LAB — BRAIN NATRIURETIC PEPTIDE: Pro B Natriuretic peptide (BNP): 29 pg/mL (ref 0.0–100.0)

## 2014-09-21 ENCOUNTER — Other Ambulatory Visit: Payer: Self-pay | Admitting: Cardiology

## 2014-09-26 ENCOUNTER — Other Ambulatory Visit: Payer: Self-pay | Admitting: Cardiology

## 2014-09-26 DIAGNOSIS — I6523 Occlusion and stenosis of bilateral carotid arteries: Secondary | ICD-10-CM

## 2014-09-27 ENCOUNTER — Telehealth: Payer: Self-pay | Admitting: Cardiology

## 2014-09-27 NOTE — Telephone Encounter (Signed)
New message     Patient calling C/O trembling, shaking, for more than 2 hours on yesterday. Again in the evening did not last long.    No emergency room visit

## 2014-09-27 NOTE — Telephone Encounter (Signed)
Calling stating that she had an episode yesterday of body shaking, heart pounding and (R) carotid was pounding and tender.  States lasted about 2 hrs.  BP was 147/77 HR 83 by machine.  Denies CP, SOB, pain in arm or neck, and sweating. States had another episode last night but lasted about 1 hr.  States she took her BS and was elevated at 130. Today  feels fine except anxious that episode will occur again. Does not have hx of palpitations, last carotid study stable.  She is requesting carotid test be done sooner than July.  Sent request to [facility].  Tried to reassure her and advised if occurs again and is prolonged will need to go to urgent care or ER.  She verbalizes understanding.

## 2014-10-02 ENCOUNTER — Ambulatory Visit (INDEPENDENT_AMBULATORY_CARE_PROVIDER_SITE_OTHER): Payer: Medicare Other | Admitting: Internal Medicine

## 2014-10-02 ENCOUNTER — Encounter: Payer: Self-pay | Admitting: Internal Medicine

## 2014-10-02 ENCOUNTER — Other Ambulatory Visit: Payer: Medicare Other

## 2014-10-02 ENCOUNTER — Other Ambulatory Visit (INDEPENDENT_AMBULATORY_CARE_PROVIDER_SITE_OTHER): Payer: Medicare Other

## 2014-10-02 VITALS — BP 102/54 | HR 68 | Temp 98.8°F | Resp 16 | Ht 62.0 in | Wt 179.4 lb

## 2014-10-02 DIAGNOSIS — E785 Hyperlipidemia, unspecified: Secondary | ICD-10-CM

## 2014-10-02 DIAGNOSIS — I6523 Occlusion and stenosis of bilateral carotid arteries: Secondary | ICD-10-CM

## 2014-10-02 DIAGNOSIS — I5032 Chronic diastolic (congestive) heart failure: Secondary | ICD-10-CM | POA: Diagnosis not present

## 2014-10-02 DIAGNOSIS — I359 Nonrheumatic aortic valve disorder, unspecified: Secondary | ICD-10-CM | POA: Diagnosis not present

## 2014-10-02 DIAGNOSIS — R05 Cough: Secondary | ICD-10-CM

## 2014-10-02 DIAGNOSIS — R059 Cough, unspecified: Secondary | ICD-10-CM

## 2014-10-02 LAB — BASIC METABOLIC PANEL
BUN: 27 mg/dL — ABNORMAL HIGH (ref 6–23)
CO2: 33 mEq/L — ABNORMAL HIGH (ref 19–32)
Calcium: 9.8 mg/dL (ref 8.4–10.5)
Chloride: 100 mEq/L (ref 96–112)
Creatinine, Ser: 1.02 mg/dL (ref 0.40–1.20)
GFR: 57.1 mL/min — ABNORMAL LOW (ref >60.00)
Glucose, Bld: 125 mg/dL — ABNORMAL HIGH (ref 70–99)
Potassium: 4.4 mEq/L (ref 3.5–5.1)
Sodium: 139 mEq/L (ref 135–145)

## 2014-10-02 LAB — HEPATIC FUNCTION PANEL
ALT: 18 U/L (ref 0–35)
AST: 21 U/L (ref 0–37)
Albumin: 4.3 g/dL (ref 3.5–5.2)
Alkaline Phosphatase: 53 U/L (ref 39–117)
Bilirubin, Direct: 0.1 mg/dL (ref 0.0–0.3)
Total Bilirubin: 0.6 mg/dL (ref 0.2–1.2)
Total Protein: 7.4 g/dL (ref 6.0–8.3)

## 2014-10-02 LAB — LIPID PANEL
Cholesterol: 211 mg/dL — ABNORMAL HIGH (ref 0–200)
HDL: 46 mg/dL (ref >39.00)
NonHDL: 165
Total CHOL/HDL Ratio: 5
Triglycerides: 238 mg/dL — ABNORMAL HIGH (ref 0.0–149.0)
VLDL: 47.6 mg/dL — ABNORMAL HIGH (ref 0.0–40.0)

## 2014-10-02 LAB — LDL CHOLESTEROL, DIRECT: Direct LDL: 138 mg/dL

## 2014-10-02 MED ORDER — BENZONATATE 100 MG PO CAPS: 100.0000 mg | ORAL_CAPSULE | Freq: Three times a day (TID) | ORAL | Status: DC | PRN

## 2014-10-02 NOTE — Patient Instructions (Signed)
Keep taking the pantoprazole daily as the cough could be coming from acid reflux. It can take 2-3 months from starting this medicine to get rid of the cough.  I have sent in tessalon perles that you can take up to 3 times a day and they help to suppress the cough. You do not need an x-ray today.

## 2014-10-02 NOTE — Progress Notes (Signed)
Pre visit review using our clinic review tool, if applicable. No additional management support is needed unless otherwise documented below in the visit note. 

## 2014-10-03 NOTE — Assessment & Plan Note (Signed)
Suspect underlying GERD which is causing the cough. Encouraged her to continue taking the protonix daily and to avoid eating close to bed time or lying down. She will need to continue therapy for 2-3 months until we can expect cough would be one. If no improvement we will re-evaluate.

## 2014-10-03 NOTE — Progress Notes (Signed)
   Subjective:    Patient ID: Abigail Oliver, female    DOB: 07-22-1945, 69 y.o.   MRN: [id number]  HPI The patient is a 69 YO female who is coming in for cough which is going on about 6 weeks now. She was seen about 1 month ago (had cxr which was negative) and treated with robitussin. She did not improve. She asked her heart doctor about it last week and he gave her protonix which she started taking daily. She has not noticed much change yet. Minimally productive and denies allergies or nasal congestion. Denies fever or chills. Denies SOB. Some mild hoarseness in the morning. No other typical GERD symptoms.   Review of Systems  Constitutional: Negative for fever, activity change, appetite change, fatigue and unexpected weight change.  Respiratory: Positive for cough. Negative for chest tightness, shortness of breath and wheezing.   Cardiovascular: Negative for chest pain, palpitations and leg swelling.  Gastrointestinal: Negative.   Musculoskeletal: Negative.       Objective:   Physical Exam  Constitutional: She appears well-developed and well-nourished.  HENT:  Head: Normocephalic and atraumatic.  Eyes: EOM are normal.  Neck: Normal range of motion.  Cardiovascular: Normal rate and regular rhythm.   Pulmonary/Chest: Effort normal. No respiratory distress. She has no wheezes. She has no rales.  Abdominal: Soft. She exhibits no distension. There is no tenderness. There is no rebound.   Filed Vitals:   10/02/14 1036  BP: 102/54  Pulse: 68  Temp: 98.8 F (37.1 C)  TempSrc: Oral  Resp: 16  Height: 5\' 2"  (1.575 m)  Weight: 179 lb 6.4 oz (81.375 kg)  SpO2: 97%      Assessment & Plan:

## 2014-10-07 ENCOUNTER — Other Ambulatory Visit: Payer: Self-pay | Admitting: *Deleted

## 2014-10-07 DIAGNOSIS — E785 Hyperlipidemia, unspecified: Secondary | ICD-10-CM

## 2014-10-07 MED ORDER — ROSUVASTATIN CALCIUM 10 MG PO TABS: 10.0000 mg | ORAL_TABLET | Freq: Every day | ORAL | Status: DC

## 2014-10-11 ENCOUNTER — Ambulatory Visit: Payer: Medicare Other | Admitting: Pharmacist Clinician (PhC)/ Clinical Pharmacy Specialist

## 2014-10-12 ENCOUNTER — Encounter (HOSPITAL_COMMUNITY): Payer: Self-pay

## 2014-10-12 ENCOUNTER — Emergency Department (HOSPITAL_COMMUNITY)
Admission: EM | Admit: 2014-10-12 | Discharge: 2014-10-12 | Disposition: A | Discharge status: Home / Self Care | Payer: Medicare Other | Attending: Emergency Medicine | Admitting: Emergency Medicine

## 2014-10-12 DIAGNOSIS — E785 Hyperlipidemia, unspecified: Secondary | ICD-10-CM | POA: Diagnosis not present

## 2014-10-12 DIAGNOSIS — F329 Major depressive disorder, single episode, unspecified: Secondary | ICD-10-CM | POA: Diagnosis not present

## 2014-10-12 DIAGNOSIS — K219 Gastro-esophageal reflux disease without esophagitis: Secondary | ICD-10-CM | POA: Diagnosis not present

## 2014-10-12 DIAGNOSIS — J029 Acute pharyngitis, unspecified: Secondary | ICD-10-CM | POA: Diagnosis not present

## 2014-10-12 DIAGNOSIS — I2510 Atherosclerotic heart disease of native coronary artery without angina pectoris: Secondary | ICD-10-CM | POA: Diagnosis not present

## 2014-10-12 DIAGNOSIS — Z951 Presence of aortocoronary bypass graft: Secondary | ICD-10-CM | POA: Insufficient documentation

## 2014-10-12 DIAGNOSIS — Z9889 Other specified postprocedural states: Secondary | ICD-10-CM | POA: Diagnosis not present

## 2014-10-12 DIAGNOSIS — E119 Type 2 diabetes mellitus without complications: Secondary | ICD-10-CM | POA: Insufficient documentation

## 2014-10-12 DIAGNOSIS — D649 Anemia, unspecified: Secondary | ICD-10-CM | POA: Insufficient documentation

## 2014-10-12 DIAGNOSIS — I5030 Unspecified diastolic (congestive) heart failure: Secondary | ICD-10-CM | POA: Diagnosis not present

## 2014-10-12 DIAGNOSIS — R600 Localized edema: Secondary | ICD-10-CM | POA: Insufficient documentation

## 2014-10-12 DIAGNOSIS — M1990 Unspecified osteoarthritis, unspecified site: Secondary | ICD-10-CM | POA: Insufficient documentation

## 2014-10-12 DIAGNOSIS — R05 Cough: Secondary | ICD-10-CM

## 2014-10-12 DIAGNOSIS — G709 Myoneural disorder, unspecified: Secondary | ICD-10-CM | POA: Diagnosis not present

## 2014-10-12 DIAGNOSIS — Z7902 Long term (current) use of antithrombotics/antiplatelets: Secondary | ICD-10-CM | POA: Insufficient documentation

## 2014-10-12 DIAGNOSIS — Z79899 Other long term (current) drug therapy: Secondary | ICD-10-CM | POA: Insufficient documentation

## 2014-10-12 DIAGNOSIS — R059 Cough, unspecified: Secondary | ICD-10-CM

## 2014-10-12 DIAGNOSIS — R42 Dizziness and giddiness: Secondary | ICD-10-CM | POA: Diagnosis not present

## 2014-10-12 LAB — RAPID STREP SCREEN (MED CTR MEBANE ONLY): Streptococcus, Group A Screen (Direct): NEGATIVE

## 2014-10-12 MED ORDER — AMOXICILLIN-POT CLAVULANATE 875-125 MG PO TABS: 1.0000 | ORAL_TABLET | Freq: Two times a day (BID) | ORAL | Status: DC

## 2014-10-12 NOTE — ED Notes (Signed)
Pt started having a sore throat since yesterday. Hurts to cough, swallow, talk, or eat.

## 2014-10-12 NOTE — Discharge Instructions (Signed)
Please read and follow all provided instructions.  Your diagnoses today include:  1. Cough   2. Sore throat    Tests performed today include:  Strep test: was negative for strep throat  Strep culture: you will be notified if this comes back positive  Vital signs. See below for your results today.   Medications prescribed:   Augmentin - antibiotic  You have been prescribed an antibiotic medicine: take the entire course of medicine even if you are feeling better. Stopping early can cause the antibiotic not to work.  Home care instructions:  Please read the educational materials provided and follow any instructions contained in this packet.  Follow-up instructions: Please follow-up with your primary care provider as needed for further evaluation of your symptoms.  Return instructions:   Please return to the Emergency Department if you experience worsening symptoms.   Return if you have worsening problems swallowing, your neck becomes swollen, you cannot swallow your saliva or your voice becomes muffled.   Return with high persistent fever, persistent vomiting, or if you have trouble breathing.   Please return if you have any other emergent concerns.  Additional Information:  Your vital signs today were: BP 132/54 mmHg   Pulse 80   Temp(Src) 99.2 F (37.3 C) (Oral)   Resp 16   SpO2 97% If your blood pressure (BP) was elevated above 135/85 this visit, please have this repeated by your doctor within one month. --------------

## 2014-10-12 NOTE — ED Provider Notes (Signed)
CSN: [id number]     Arrival date & time 10/12/14  1636 History  This chart was scribed for non-physician practitioner Alecia Lemming, PA, working with Sherwood Gambler, MD, by Eustaquio Maize, ED Scribe. The patient was seen in room TR05C/TR05C and the patient's care was started at 5:51 PM.    Chief Complaint  Patient presents with  . Sore Throat   The history is provided by the patient. No language interpreter was used.     HPI Comments: Abigail Oliver is a 69 y.o. female with hx CAD s/p stents, HLD, CHF, and DM, who presents to the Emergency Department complaining of sore throat x 1 day. The pain is exacerbated with eating, drinking, and coughing. Pt states when she coughs she gets dizzy and short of breath. Pt is having difficulty swallowing due to pain but is able to do so. She notes a fever of 100.73F earlier today. Her temperature in the ED is 99.23F. No meds PTA. Pt saw PCP last week for severe cough and was diagnosed with reflux. She was rx Protonix and tessalon. Pt had CXR done at that time which was negative. She mentions that she is retained fluid but states it is no worse than normal. No new orthopnea, PNA, leg swelling. Denies chest pain, neck pain, upper extremity pain, or any other symptoms. Pt is currently on Warfarin and Plavix for her valve replacement. She had her Coumadin level checked last week.   PCP - Doug Sou   Past Medical History  Diagnosis Date  . Coronary atherosclerosis of native coronary artery   . Hyperlipidemia   . Chronic angle-closure glaucoma(365.23)   . Aortic valve disorders   . Peripheral vascular disease   . Type II diabetes mellitus   . Depression   . PONV (postoperative nausea and vomiting)     N&V  . CHF (congestive heart failure)     diastolic heart failure  . GERD (gastroesophageal reflux disease)   . Neuromuscular disorder     neuropathy  . Arthritis     osteo  . Anemia   . Syncope 03/29/2012    carotid doppler - R ICA 50-69% reduction by  velocities (low end); L ICA 0-49% reduction by velocities (low end); R and L subclavian arteries - <50% redcution; R and L vertebral arteries show normal antegrade flow  . Dizziness 02/09/2012    Holter monitor - sinus rhythm, no ventricular ectopic beats, no sulpraventricular ectopic beats noted  . OSA (obstructive sleep apnea)   . Abnormal stress test, 7/15, + ischemia 12/15/2013  . S/P arterial stent-to LAD, Promus DES 12/14/13 12/15/2013   Past Surgical History  Procedure Laterality Date  . Carpal tunnel release  1989    bilaterally  . Cholecystectomy  1990  . Peripheral arterial stent graft  09/20/11    right SFA  . Cardiac valve replacement  2000    aortic valve   . Laceration repair      right hand  . Neuroplasty / transposition ulnar nerve at elbow      right  . Tonsillectomy  1968  . Vaginal hysterectomy  1985    Fibroids  . Bilateral oophorectomy  1987  . Cesarean section  1969; 1971  . Breast biopsy  2012    left  . Lymph node biopsy  06/2011    "core needle on 5"  . Cataract extraction w/ intraocular lens  implant, bilateral  ~ 2007  . Refractive surgery  ` 2004    "for  glaucoma"  . Needle biopsy  2009    "on ankles for nerve damage"  . Esophagogastroduodenoscopy N/A 10/27/2012    Procedure: ESOPHAGOGASTRODUODENOSCOPY (EGD);  Surgeon: Beryle Beams, MD;  Location: [facility];  Service: Endoscopy;  Laterality: N/A;  . Colonoscopy N/A 10/27/2012    Procedure: COLONOSCOPY;  Surgeon: Beryle Beams, MD;  Location: [facility] ENDOSCOPY;  Service: Endoscopy;  Laterality: N/A;  . Doppler echocardiography  03/29/2012    EF >55%; mild concentric LVH; stage 1 diastolic dysfunction, elevated LV filling pressure, dilated LA; MAC mild MR; St Jude AVR peak and mean gradients of 6mmHg and 10mmHg; transvalvular gradients have increased (prev 23 and 14 respectively)  . Cardiovascular stress test  08/25/2011    R/L MV - EF 72%; no scintigraphic evidence of inducible MI; normal perfusionTID of 1.25  elevated - could indicate small vessle subendocardial ischemia; EKG NSR at 66, non diagnostic for ischemia  . Cardiac catheterization  01/14/1999    normal LV function, severe aortic stenosis; 80% and 70% stenosis in RCA; mild 20% distal norrowing in L main with 20% proximal LAD stenosis, 40% diagonal stenosis and 20% proximal circumflex stenosis  . Femoral artery stent  09/20/2011    6 x 40 Smart Nitinol self-expanding stent placed;  10/15/2031 -R SFA stent open and patent w/o evidence of restenosis  . Coronary artery bypass graft  2000    RIMA to RCA  . Lower extremity angiogram N/A 09/20/2011    Procedure: LOWER EXTREMITY ANGIOGRAM;  Surgeon: Lorretta Harp, MD;  Location: [facility] CATH LAB;  Service: Cardiovascular;  Laterality: N/A;  . Left and right heart catheterization with coronary angiogram N/A 12/14/2013    Procedure: LEFT AND RIGHT HEART CATHETERIZATION WITH CORONARY ANGIOGRAM;  Surgeon: Larey Dresser, MD;  Location: [facility] CATH LAB;  Service: Cardiovascular;  Laterality: N/A;  . Fractional flow reserve wire  12/14/2013    Procedure: FRACTIONAL FLOW RESERVE WIRE;  Surgeon: Larey Dresser, MD;  Location: [facility] CATH LAB;  Service: Cardiovascular;;  . Percutaneous coronary stent intervention (pci-s)  12/14/2013    Procedure: PERCUTANEOUS CORONARY STENT INTERVENTION (PCI-S);  Surgeon: Larey Dresser, MD;  Location: [facility] CATH LAB;  Service: Cardiovascular;;  Prox LAD 3.00x12 Promus DES    Family History  Problem Relation Age of Onset  . Colon cancer Mother   . COPD Mother   . Emphysema Mother   . Diabetes Neg Hx   . Cancer Maternal Grandmother   . Cancer Maternal Grandfather   . Heart disease Brother    History  Substance Use Topics  . Smoking status: Never Smoker   . Smokeless tobacco: Never Used  . Alcohol Use: No   OB History    No data available     Review of Systems  Constitutional: Positive for fever. Negative for chills and fatigue.  HENT: Positive for sore throat and trouble  swallowing. Negative for congestion, ear pain, rhinorrhea and sinus pressure.   Eyes: Negative for redness.  Respiratory: Positive for cough and shortness of breath. Negative for wheezing.   Cardiovascular: Negative for chest pain.  Gastrointestinal: Negative for nausea, vomiting, abdominal pain and diarrhea.  Genitourinary: Negative for dysuria.  Musculoskeletal: Negative for myalgias, arthralgias, neck pain and neck stiffness.  Skin: Negative for rash.  Neurological: Positive for dizziness. Negative for headaches.  Hematological: Negative for adenopathy.      Allergies  Atorvastatin; Simvastatin; Sulfa antibiotics; and Iodinated diagnostic agents  Home Medications   Prior to Admission medications   Medication  Sig Start Date End Date Taking? Authorizing Provider  Ascorbic Acid (VITAMIN C) 500 MG tablet Take 500 mg by mouth 2 (two) times daily.     Historical Provider, MD  benzonatate (TESSALON) 100 MG capsule Take 1 capsule (100 mg total) by mouth 3 (three) times daily as needed for cough. 10/02/14   Olga Millers, MD  calcium-vitamin D (CALCIUM 500+D) 500-200 MG-UNIT per tablet Take 1 tablet by mouth 2 (two) times daily.     Historical Provider, MD  citalopram (CELEXA) 20 MG tablet Take 1 tablet (20 mg total) by mouth at bedtime. 05/29/14   Olga Millers, MD  clopidogrel (PLAVIX) 75 MG tablet Take 1 tablet (75 mg total) by mouth daily with breakfast. 12/15/13   Isaiah Serge, NP  ferrous sulfate 325 (65 FE) MG tablet Take 1 tablet (325 mg total) by mouth daily. 10/28/13   Pattricia Boss, MD  folic acid (FOLVITE) 1 MG tablet Take 1 tablet (1 mg total) by mouth daily. 07/23/14   Olga Millers, MD  gabapentin (NEURONTIN) 300 MG capsule Take 1 capsule (300 mg total) by mouth 3 (three) times daily. 07/09/14   Olga Millers, MD  latanoprost (XALATAN) 0.005 % ophthalmic solution PLACE 1 DROP INTO BOTH EYES AT BEDTIME. 07/25/14   Olga Millers, MD  metFORMIN (GLUCOPHAGE) 1000 MG  tablet Take 1 tablet (1,000 mg total) by mouth 2 (two) times daily with a meal. 02/18/14   Olga Millers, MD  metoprolol succinate (TOPROL-XL) 50 MG 24 hr tablet TAKE 1 TABLET (50 MG TOTAL) BY MOUTH DAILY. **PATIENT NEEDS AN APPOINTMENT** 07/23/14   Larey Dresser, MD  metoprolol succinate (TOPROL-XL) 50 MG 24 hr tablet Take 1 tablet (50 mg total) by mouth daily. 09/23/14   Larey Dresser, MD  nitroGLYCERIN (NITROSTAT) 0.4 MG SL tablet Place 1 tablet (0.4 mg total) under the tongue every 5 (five) minutes as needed for chest pain. 12/15/13   Isaiah Serge, NP  pantoprazole (PROTONIX) 40 MG tablet Take 1 tablet (40 mg total) by mouth daily. 09/19/14   Larey Dresser, MD  potassium chloride SA (K-DUR,KLOR-CON) 20 MEQ tablet 2 tablets (40 mEq) by mouth two times a day 09/19/14   Larey Dresser, MD  rosuvastatin (CRESTOR) 10 MG tablet Take 1 tablet (10 mg total) by mouth daily. 10/07/14   Larey Dresser, MD  torsemide (DEMADEX) 20 MG tablet 3 tablets (60mg ) by mouth in the AM and 2 tablets (40mg ) by mouth in the PM 09/19/14   Larey Dresser, MD  traMADol (ULTRAM) 50 MG tablet Take 1 tablet (50 mg total) by mouth 2 (two) times daily. 02/18/14   Olga Millers, MD  traZODone (DESYREL) 50 MG tablet Take 0.5-1 tablets (25-50 mg total) by mouth at bedtime as needed for sleep. 02/18/14   Olga Millers, MD  warfarin (COUMADIN) 3 MG tablet TAKE 1 TO 1 AND 1/2 TABLET(S) BY MOUTH DAILY AS DIRECTED BY  COUMADIN  CLINIC 08/16/14   Rockne Menghini, RPH-CPP   Triage Vitals: BP 132/54 mmHg  Pulse 80  Temp(Src) 99.2 F (37.3 C) (Oral)  Resp 16  SpO2 97%   Physical Exam  Constitutional: She appears well-developed and well-nourished. No distress.  HENT:  Head: Normocephalic and atraumatic.  Right Ear: Tympanic membrane, external ear and ear canal normal.  Left Ear: Tympanic membrane, external ear and ear canal normal.  Nose: Nose normal. No mucosal edema or rhinorrhea.  Mouth/Throat: Mucous membranes  are normal. Mucous membranes are not dry. No trismus in the jaw. No uvula swelling. Posterior oropharyngeal erythema present. No oropharyngeal exudate, posterior oropharyngeal edema or tonsillar abscesses.  Eyes: Conjunctivae and EOM are normal. Right eye exhibits no discharge. Left eye exhibits no discharge.  Neck: Trachea normal and normal range of motion. Neck supple. Normal carotid pulses and no JVD present. No muscular tenderness present. Carotid bruit is not present. No tracheal deviation present.  Cardiovascular: Normal rate, regular rhythm, S1 normal, S2 normal, normal heart sounds and intact distal pulses.  Exam reveals no decreased pulses.   No murmur heard. Pulmonary/Chest: Effort normal and breath sounds normal. No respiratory distress. She has no wheezes. She has no rales. She exhibits no tenderness.  Abdominal: Soft. Normal aorta and bowel sounds are normal. There is no tenderness. There is no rebound and no guarding.  Musculoskeletal: Normal range of motion. She exhibits no edema or tenderness.  Trace pedal edema.  Neurological: She is alert.  Skin: Skin is warm and dry. She is not diaphoretic. No cyanosis. No pallor.  Psychiatric: She has a normal mood and affect. Her behavior is normal.  Nursing note and vitals reviewed.   ED Course  Procedures (including critical care time)  DIAGNOSTIC STUDIES: Oxygen Saturation is 97% on RA, normal by my interpretation.    COORDINATION OF CARE: 5:56 PM- Will wait for rapid strep test results. Do not suspect strep test due to pt's oropharynx appearance. Educated pt about causes for chronic cough including post nasal drip, asthma like symptoms, and reflux. Will prescribe antibiotics to see if pt's cough subsides. Advised pt to follow up with PCP.   Labs Review Labs Reviewed  RAPID STREP SCREEN    Imaging Review No results found.   EKG Interpretation None       Vital signs reviewed and are as follows: Filed Vitals:   10/12/14  1837  BP: 128/58  Pulse: 73  Temp: 98.6 F (37 C)  Resp: 20   Will d/c with rx for Augmentin, likely best choice given patient's use of warfarin.   Patient encouraged to follow-up with her PCP in the next 3 days for recheck. Patient encouraged to return with worsening, new of changing symptoms.   MDM   Final diagnoses:  Cough  Sore throat   Cough: Patient has had this for several weeks. She is being evaluated for this by her PCP. PPI to this point has not been very effective. Patient is well anticoagulated and I do not suspect PE. Do not suspect pneumonia. Do not suspect heart failure exacerbation given her history.  Sore throat: Likely irritation from cough, possible GERD. Do not suspect anginal equivalent. Patient has worsening of pain with swallowing and coughing. Very low suspicion for CAD.  I personally performed the services described in this documentation, which was scribed in my presence. The recorded information has been reviewed and is accurate.      Carlisle Cater, PA-C 10/12/14 1847  Sherwood Gambler, MD 10/17/14 [phone]

## 2014-10-12 NOTE — ED Notes (Signed)
Declined W/C at D/C and was escorted to lobby by RN. 

## 2014-10-14 LAB — CULTURE, GROUP A STREP: Strep A Culture: NEGATIVE

## 2014-10-15 ENCOUNTER — Telehealth: Payer: Self-pay | Admitting: *Deleted

## 2014-10-15 NOTE — Telephone Encounter (Signed)
[facility] Night - Client [facility] Patient Name: Abigail Oliver Gender: Female DOB: [birth year] Age: 69 Y 1 M 8 D Return Phone Number: [phone] (Primary) Address: [address] City/State/Zip: [place] [address] Client [facility] Night - Client Client Site [place] Physician [place], [place] Type Call Call Type Triage / Clinical Relationship To Patient Self Return Phone Number [phone] (Primary) Chief Complaint Cough Initial Comment Caller states that she thinks she has strep throat. She can't swallow, has a hard hacky cough that hurts. it's white in her throat, states is a heart patient [place] ED PreDisposition Go to Urgent Care/Walk-In Clinic Nurse Assessment Nurse: Venetia Maxon, RN, Manuela Schwartz Date/Time (Eastern Time): 10/12/2014 2:48:30 PM Confirm and document reason for call. If symptomatic, describe symptoms. ---Caller states that she thinks she has strep throat. She can't swallow, has a hard hacky cough that hurts. it's white in her throat, states is a heart patient She is able to drink liquids. Last urine was an hour ago. 98.4 orally she has felt cold chills Has the patient traveled out of the country within the last 30 days? ---No Does the patient require triage? ---Yes Related visit to physician within the last 2 weeks? ---No Does the PT have any chronic conditions? (i.e. diabetes, asthma, etc.) ---Yes List chronic conditions. ---reflux heart problem aortic valve replaced 2000 NIDDM blood sugar this 118 Guidelines Guideline Title Affirmed Question Affirmed Notes Nurse Date/Time (Eastern Time) Sore Throat Patient sounds very sick or weak to the triager Venetia Maxon, RN, Manuela Schwartz 10/12/2014 2:51:07 PM Disp. Time Eilene Ghazi Time) Disposition Final User 10/12/2014 2:52:09 PM Go to ED Now (or PCP triage) Yes Venetia Maxon, RN, Edwena Bunde  Understands: Yes Disagree/Comply: Comply PLEASE NOTE: All timestamps contained within this report are represented as [place] Standard Time. CONFIDENTIALTY NOTICE: This fax transmission is intended only for the addressee. It contains information that is legally privileged, confidential or otherwise protected from use or disclosure. If you are not the intended recipient, you are strictly prohibited from reviewing, disclosing, copying using or disseminating any of this information or taking any action in reliance on or regarding this information. If you have received this fax in error, please notify us immediately by telephone so that we can arrange for its return to [place]. Phone: [phone], Toll-Free: [phone], Fax: [phone] Page: 2 of 2 Call Id: [id number] Care Advice Given Per Guideline GO TO ED NOW (OR PCP TRIAGE): DRIVING: Another adult should drive. CARE ADVICE given per Sore Throat (Adult) guideline. After Care Instructions Given Call Event Type User Date / Time Description Referrals [facility] - ED

## 2014-10-16 ENCOUNTER — Ambulatory Visit: Payer: Medicare Other | Admitting: Pharmacist Clinician (PhC)/ Clinical Pharmacy Specialist

## 2014-10-21 ENCOUNTER — Other Ambulatory Visit: Payer: Self-pay | Admitting: Cardiology

## 2014-10-22 ENCOUNTER — Other Ambulatory Visit: Payer: Self-pay | Admitting: Geriatric Medicine

## 2014-10-22 MED ORDER — CITALOPRAM HYDROBROMIDE 20 MG PO TABS: 20.0000 mg | ORAL_TABLET | Freq: Every day | ORAL | Status: DC

## 2014-10-22 MED ORDER — TRAZODONE HCL 50 MG PO TABS: 25.0000 mg | ORAL_TABLET | Freq: Every evening | ORAL | Status: DC | PRN

## 2014-11-04 ENCOUNTER — Ambulatory Visit (INDEPENDENT_AMBULATORY_CARE_PROVIDER_SITE_OTHER): Payer: Medicare Other | Admitting: Pharmacist Clinician (PhC)/ Clinical Pharmacy Specialist

## 2014-11-04 DIAGNOSIS — Z7901 Long term (current) use of anticoagulants: Secondary | ICD-10-CM | POA: Diagnosis not present

## 2014-11-04 DIAGNOSIS — Z952 Presence of prosthetic heart valve: Secondary | ICD-10-CM

## 2014-11-04 DIAGNOSIS — Z954 Presence of other heart-valve replacement: Secondary | ICD-10-CM

## 2014-11-04 LAB — POCT INR: INR: 4.3

## 2014-11-13 ENCOUNTER — Other Ambulatory Visit: Payer: Self-pay | Admitting: Geriatric Medicine

## 2014-11-15 ENCOUNTER — Other Ambulatory Visit: Payer: Self-pay | Admitting: Internal Medicine

## 2014-11-18 ENCOUNTER — Other Ambulatory Visit: Payer: Medicare Other

## 2014-11-18 ENCOUNTER — Other Ambulatory Visit: Payer: Self-pay

## 2014-11-18 MED ORDER — CLOPIDOGREL BISULFATE 75 MG PO TABS: 75.0000 mg | ORAL_TABLET | Freq: Every day | ORAL | Status: DC

## 2014-11-20 ENCOUNTER — Encounter: Payer: Self-pay | Admitting: *Deleted

## 2014-11-20 ENCOUNTER — Ambulatory Visit (INDEPENDENT_AMBULATORY_CARE_PROVIDER_SITE_OTHER): Payer: Medicare Other | Admitting: *Deleted

## 2014-11-20 ENCOUNTER — Telehealth: Payer: Self-pay | Admitting: Cardiology

## 2014-11-20 ENCOUNTER — Ambulatory Visit (INDEPENDENT_AMBULATORY_CARE_PROVIDER_SITE_OTHER): Payer: Medicare Other | Admitting: Cardiology

## 2014-11-20 VITALS — BP 122/50 | HR 73 | Ht 62.0 in | Wt 183.0 lb

## 2014-11-20 DIAGNOSIS — Z952 Presence of prosthetic heart valve: Secondary | ICD-10-CM

## 2014-11-20 DIAGNOSIS — Z954 Presence of other heart-valve replacement: Secondary | ICD-10-CM

## 2014-11-20 DIAGNOSIS — I2510 Atherosclerotic heart disease of native coronary artery without angina pectoris: Secondary | ICD-10-CM

## 2014-11-20 DIAGNOSIS — I739 Peripheral vascular disease, unspecified: Secondary | ICD-10-CM | POA: Diagnosis not present

## 2014-11-20 DIAGNOSIS — Z9889 Other specified postprocedural states: Secondary | ICD-10-CM

## 2014-11-20 DIAGNOSIS — I5032 Chronic diastolic (congestive) heart failure: Secondary | ICD-10-CM

## 2014-11-20 DIAGNOSIS — I6523 Occlusion and stenosis of bilateral carotid arteries: Secondary | ICD-10-CM | POA: Diagnosis not present

## 2014-11-20 DIAGNOSIS — Z959 Presence of cardiac and vascular implant and graft, unspecified: Secondary | ICD-10-CM

## 2014-11-20 DIAGNOSIS — Z7901 Long term (current) use of anticoagulants: Secondary | ICD-10-CM

## 2014-11-20 LAB — CBC WITH DIFFERENTIAL/PLATELET
Basophils Absolute: 0.1 10*3/uL (ref 0.0–0.1)
Basophils Relative: 0.8 % (ref 0.0–3.0)
Eosinophils Absolute: 0.1 10*3/uL (ref 0.0–0.7)
Eosinophils Relative: 1.5 % (ref 0.0–5.0)
HCT: 32.3 % — ABNORMAL LOW (ref 36.0–46.0)
Hemoglobin: 11 g/dL — ABNORMAL LOW (ref 12.0–15.0)
Lymphocytes Relative: 35.7 % (ref 12.0–46.0)
Lymphs Abs: 2.3 10*3/uL (ref 0.7–4.0)
MCHC: 34 g/dL (ref 30.0–36.0)
MCV: 86.9 fl (ref 78.0–100.0)
Monocytes Absolute: 0.6 10*3/uL (ref 0.1–1.0)
Monocytes Relative: 9.3 % (ref 3.0–12.0)
Neutro Abs: 3.4 10*3/uL (ref 1.4–7.7)
Neutrophils Relative %: 52.7 % (ref 43.0–77.0)
Platelets: 186 10*3/uL (ref 150.0–400.0)
RBC: 3.72 Mil/uL — ABNORMAL LOW (ref 3.87–5.11)
RDW: 15.7 % — ABNORMAL HIGH (ref 11.5–15.5)
WBC: 6.5 10*3/uL (ref 4.0–10.5)

## 2014-11-20 LAB — BASIC METABOLIC PANEL
BUN: 22 mg/dL (ref 6–23)
CO2: 30 mEq/L (ref 19–32)
Calcium: 9.7 mg/dL (ref 8.4–10.5)
Chloride: 102 mEq/L (ref 96–112)
Creatinine, Ser: 1.34 mg/dL — ABNORMAL HIGH (ref 0.40–1.20)
GFR: 41.66 mL/min — ABNORMAL LOW (ref >60.00)
Glucose, Bld: 93 mg/dL (ref 70–99)
Potassium: 4.3 mEq/L (ref 3.5–5.1)
Sodium: 141 mEq/L (ref 135–145)

## 2014-11-20 LAB — POCT INR: INR: 5

## 2014-11-20 LAB — BRAIN NATRIURETIC PEPTIDE: Pro B Natriuretic peptide (BNP): 83 pg/mL (ref 0.0–100.0)

## 2014-11-20 MED ORDER — ROSUVASTATIN CALCIUM 20 MG PO TABS: ORAL_TABLET | ORAL | Status: DC

## 2014-11-20 MED ORDER — LOSARTAN POTASSIUM 25 MG PO TABS: 25.0000 mg | ORAL_TABLET | Freq: Every day | ORAL | Status: DC

## 2014-11-20 NOTE — Telephone Encounter (Signed)
New Message        Calling stating that Dr. [name] wrote the pt a prescription for Lorsartan, and another doctor has pt on a similar medication already Lisinopril. Please call back and advise.

## 2014-11-20 NOTE — Patient Instructions (Addendum)
Medication Instructions:  Stop Simvastatin  Start crestor 10mg  daily. You can take 1/2 of a 20mg  tablet daily.  Stop lisinopril. Start losartan 25mg  daily.  THE FIRST OF AUGUST 2016-STOP PLAVIX.  Start aspirin 81mg  daily the day after you stop Plavix.  Labwork: CBCd/BMET/BNP today  Your physician recommends that you return for a FASTING lipid profile /liver profile in 2 months.    Testing/Procedures: Your physician has requested that you have a lower  extremity arterial duplex. This test is an ultrasound of the arteries in the legs . It looks at arterial blood flow in the legs . Allow one hour for Lower and Upper Arterial scans. There are no restrictions or special instructions July 2016  Your physician has requested that you have a carotid duplex. This test is an ultrasound of the carotid arteries in your neck. It looks at blood flow through these arteries that supply the brain with blood. Allow one hour for this exam. There are no restrictions or special instructions. July 2016     Follow-Up: Your physician recommends that you schedule a follow-up appointment in: 3 months with Dr [name].    Thank you for choosing [facility]!!

## 2014-11-20 NOTE — Telephone Encounter (Signed)
Mali advised lisinopril stopped and losartan 25mg  daily started by Dr [name] today.

## 2014-11-21 ENCOUNTER — Encounter: Payer: Self-pay | Admitting: Cardiology

## 2014-11-21 ENCOUNTER — Telehealth: Payer: Self-pay | Admitting: Cardiology

## 2014-11-21 NOTE — Telephone Encounter (Signed)
Follow Up       Pt returning Anne's phone call.

## 2014-11-21 NOTE — Progress Notes (Signed)
Patient ID: Abigail Oliver, female   DOB: 06-Oct-1945, 69 y.o.   MRN: [id number] PCP: Dr. Doug Sou  69 yo with history of aortic stenosis s/p St Jude mechanical AVR (6/00) and CAD s/p single vessel CABG (6/00) presents for cardiology evaluation.  She had severe aortic stenosis and therefore required CABG-AVR in 6/00.  She had a RIMA-RCA. She had a myoview in 4/13 with no ischemia or infarction, and echo in 6/14 showed normal EF with moderate diastolic dysfunction and a well-seated mechanical valve.    In 6/15, torsemide was stopped and she was begun on Lasix due to increased cost of torsemide.  She developed dyspnea and lower extremity edema.  She was admitted later that month with community-acquired PNA as well as CHF.  CT showed RLL PNA and multiple pulmonary nodules.  Echo in 6/15 showed EF 60-65%, mechanical aortic valve with mean gradient 27 mmHg. After discharge, she continued to be short of breath.  Lasix was increased to 80 mg bid.  This did not help much.  She was short of breath walking around in her yard.  No chest pain.  Lexiscan Cardiolite was done in 7/15 given the exertional symptoms and showed a small area of basal inferolateral ischemia.   Given ongoing exertional symptoms, I did a right and left heart catheterization in 7/15.  Right and left heart filling pressures were normal.  There was an 80% stenosis in the proximal LAD that was hemodynamically significant by FFR.  Patient had DES to proximal LAD.    She still has a chronic cough. PPI has not helped.  She tells me today that she is taking lisinopril.  This was apparently started by her endocrinologist who is not in the [facility] system, so has not made it onto the Epic medication list.  No chest pain.  Myalgias with simvastatin, now on Crestor 10 mg daily.  She is working Chief of Staff).  No BRBPR or melena.  She continues to torsemide 60 qam/40 qpm. No claudication. No dyspnea walking on flat ground.  Some dyspnea walking up steps.   Labs  (6/14): BNP 216, HCT 32, K 4.8, creatinine 0.8 Labs (8/14): K 4.4, creatinine 0.8, BNP 55, HDL 45, LDL 141 Labs (10/14): LDL 61, HDL 43, AST 40, ALT 31 Labs (6/15): BNP 37 Labs (7/15): K 4.4, creatinine 0.8 => 0.77 Labs (9/15): HCT 34.5, LDL 32, HDL 40 Labs (4/16): K 3.8, creatinine 0.96 Labs (5/16): K 4.4, creatinine 1.02, LDL 138  PMH: 1. Aortic stenosis: Now has St Jude mechanical aortic valve (6/00).  Echo (6/14) with EF 60-65%, mild LVH, moderate diastolic dysfunction, normal RV, mechanical aortic valve with mean gradient 20 mmHg.  Echo (6/15) with EF 60-65%, mechanical aortic valve with mean gradient 27 mmHg.  2. CAD: CABG at time of AVR in 6/00 with RIMA-RCA.  Myoview in 4/13 with EF 72%, no ischemia or infarction. Lexiscan Cardiolite (7/15) with EF 69%, small area of basal inferolateral ischemia.  LHC (7/15) with patent RIMA-RCA, 80% mRCA, 80% pLAD with FFR 0.73, treated with DES to pLAD.  3. HTN 4. Type II diabetes 5. GERD 6. OSA 7. Hyperlipidemia 8. PAD: Right SFA stent. Peripheral arterial dopplers (7/15) with patent right SFA stent.  9. Colon polyps 10. Carotid stenosis: Carotid dopplers (11/13) witih 50-69% RICA.  Carotid dopplers (7/15) with 40-59% RICA.  11. Diastolic CHF 12. Gastritis 13. Dizziness: Holter (8/14) with few PACs, otherwise unremarkable.  14. Mild transaminase elevation 15. Pulmonary nodules: Noted by CT in 6/15.  CT chest (8/15) was thought to indicate that nodules were benign.  No further workup was recommended.  16. Contrast allergy 17. Diastolic CHF: RHC (4/40) with mean RA 2, PA 22/11, mean PCWP 9, CI 3.79 18. Low back pain.   SH: Married, works as Theme park manager, nonsmoker.   FH: CAD  ROS: All systems reviewed and negative except as per HPI.   Current Outpatient Prescriptions  Medication Sig Dispense Refill  . Ascorbic Acid (VITAMIN C) 500 MG tablet Take 500 mg by mouth 2 (two) times daily.     . calcium-vitamin D (CALCIUM 500+D) 500-200 MG-UNIT  per tablet Take 1 tablet by mouth 2 (two) times daily.     . citalopram (CELEXA) 20 MG tablet Take 1 tablet (20 mg total) by mouth at bedtime. 30 tablet 5  . clopidogrel (PLAVIX) 75 MG tablet Take 1 tablet (75 mg total) by mouth daily with breakfast. 30 tablet 11  . ferrous sulfate 325 (65 FE) MG tablet Take 1 tablet (325 mg total) by mouth daily. 30 tablet 0  . folic acid (FOLVITE) 1 MG tablet TAKE 1 TABLET (1 MG TOTAL) BY MOUTH DAILY. 30 tablet 3  . gabapentin (NEURONTIN) 300 MG capsule Take 1 capsule (300 mg total) by mouth 3 (three) times daily. 90 capsule 3  . latanoprost (XALATAN) 0.005 % ophthalmic solution PLACE 1 DROP INTO BOTH EYES AT BEDTIME. 2.5 mL 2  . metFORMIN (GLUCOPHAGE) 1000 MG tablet Take 1 tablet (1,000 mg total) by mouth 2 (two) times daily with a meal. 60 tablet 12  . metoprolol succinate (TOPROL-XL) 50 MG 24 hr tablet TAKE 1 TABLET (50 MG TOTAL) BY MOUTH DAILY. 30 tablet 0  . pantoprazole (PROTONIX) 40 MG tablet Take 1 tablet (40 mg total) by mouth daily. 90 tablet 3  . potassium chloride SA (K-DUR,KLOR-CON) 20 MEQ tablet 2 tablets (40 mEq) by mouth two times a day 360 tablet 3  . torsemide (DEMADEX) 20 MG tablet 3 tablets (60mg ) by mouth in the AM and 2 tablets (40mg ) by mouth in the PM 450 tablet 3  . traZODone (DESYREL) 50 MG tablet Take 0.5-1 tablets (25-50 mg total) by mouth at bedtime as needed for sleep. 30 tablet 1  . warfarin (COUMADIN) 3 MG tablet TAKE 1 TO 1 AND 1/2 TABLET(S) BY MOUTH DAILY AS DIRECTED BY  COUMADIN  CLINIC 45 tablet 3  . losartan (COZAAR) 25 MG tablet Take 1 tablet (25 mg total) by mouth daily. 90 tablet 3  . nitroGLYCERIN (NITROSTAT) 0.4 MG SL tablet Place 1 tablet (0.4 mg total) under the tongue every 5 (five) minutes as needed for chest pain. (Patient not taking: Reported on 11/20/2014) 25 tablet 3  . rosuvastatin (CRESTOR) 20 MG tablet 1/2 TABLET DAILY 90 tablet 0  . traMADol (ULTRAM) 50 MG tablet Take 1 tablet (50 mg total) by mouth 2 (two)  times daily. (Patient not taking: Reported on 11/20/2014) 60 tablet 3   No current facility-administered medications for this visit.    BP 122/50 mmHg  Pulse 73  Ht 5\' 2"  (1.575 m)  Wt 183 lb (83.008 kg)  BMI 33.46 kg/m2  SpO2 96% General: NAD Neck: JVP 7 cm, no thyromegaly or thyroid nodule.  Lungs: Clear to auscultation bilaterally with normal respiratory effort. CV: Nondisplaced PMI.  Heart regular S1/S2, mechanical S2, no S3/S4, 1/6 early SEM RUSB.  No ankle edema.  No carotid bruit.  Normal pedal pulses.  Abdomen: Soft, nontender, no hepatosplenomegaly, mild distention.  Neurologic: Alert and  oriented x 3.  Psych: Normal affect. Extremities: No clubbing or cyanosis.    Assessment/Plan: 1. St Jude mechanical aortic valve:  Mildly elevated mean gradient for this valve on echo in 6/15.  Continue coumadin. CBC today.  2. Chronic diastolic CHF: NYHA class II-III symptoms, stable.  Bothered by cough.  - Check BMET/BNP today.   - Continue current torsemide and KCl.  3. CAD: s/p RIMA-RCA at time of AVR.  She developed exertional dyspnea and had cath showing significant proximal LAD stenosis in 7/15 that was treated with BMS.  She feels much better since PCI.  - She will continue Plavix + warfarin for 1 year (to 8/16), then go back to ASA 81 + warfarin.  - Continue statin.   4. Hyperlipidemia: Myalgias with atorvastatin and simvastatin.  She will restart Crestor 10 mg daily, lipids/LFTs 2 months.. 5. Carotid stenosis: Repeat 7/16.  6. PAD: Known PAD with right SFA stent.  Last peripheral arterial dopplers showed patent R SFA stent.  I will arrange ABIs in 7/16.  7. Chronic cough: No improvement with PPI.  Found out today that she is on lisinopril (not on our med list).  I will have her stop this and start on losartan 25 mg daily.  If this does not help cough, needs pulmonary referral.    Loralie Champagne 11/21/2014

## 2014-11-21 NOTE — Telephone Encounter (Signed)
I spoke with patient about recent lab results.

## 2014-11-26 ENCOUNTER — Other Ambulatory Visit: Payer: Self-pay | Admitting: Cardiology

## 2014-11-26 DIAGNOSIS — I739 Peripheral vascular disease, unspecified: Secondary | ICD-10-CM

## 2014-11-27 ENCOUNTER — Telehealth: Payer: Self-pay | Admitting: Internal Medicine

## 2014-11-27 DIAGNOSIS — N63 Unspecified lump in breast: Secondary | ICD-10-CM | POA: Diagnosis not present

## 2014-11-27 DIAGNOSIS — M8580 Other specified disorders of bone density and structure, unspecified site: Secondary | ICD-10-CM | POA: Diagnosis not present

## 2014-11-27 LAB — HM MAMMOGRAPHY

## 2014-11-27 NOTE — Telephone Encounter (Signed)
Abigail Oliver from [facility] called, patient is there, and nee and order for a bone density test.

## 2014-11-28 NOTE — Telephone Encounter (Signed)
This was signed and faxed back yesterday to them.

## 2014-12-02 ENCOUNTER — Other Ambulatory Visit: Payer: Self-pay | Admitting: Cardiology

## 2014-12-02 ENCOUNTER — Ambulatory Visit (INDEPENDENT_AMBULATORY_CARE_PROVIDER_SITE_OTHER): Payer: Medicare Other

## 2014-12-02 ENCOUNTER — Other Ambulatory Visit: Payer: Medicare Other

## 2014-12-02 ENCOUNTER — Ambulatory Visit (HOSPITAL_BASED_OUTPATIENT_CLINIC_OR_DEPARTMENT_OTHER): Payer: Medicare Other

## 2014-12-02 ENCOUNTER — Ambulatory Visit (HOSPITAL_COMMUNITY): Payer: Medicare Other | Attending: Internal Medicine

## 2014-12-02 DIAGNOSIS — Z951 Presence of aortocoronary bypass graft: Secondary | ICD-10-CM | POA: Insufficient documentation

## 2014-12-02 DIAGNOSIS — Z7901 Long term (current) use of anticoagulants: Secondary | ICD-10-CM

## 2014-12-02 DIAGNOSIS — E119 Type 2 diabetes mellitus without complications: Secondary | ICD-10-CM | POA: Diagnosis not present

## 2014-12-02 DIAGNOSIS — E785 Hyperlipidemia, unspecified: Secondary | ICD-10-CM | POA: Insufficient documentation

## 2014-12-02 DIAGNOSIS — I739 Peripheral vascular disease, unspecified: Secondary | ICD-10-CM

## 2014-12-02 DIAGNOSIS — I10 Essential (primary) hypertension: Secondary | ICD-10-CM | POA: Diagnosis not present

## 2014-12-02 DIAGNOSIS — I6523 Occlusion and stenosis of bilateral carotid arteries: Secondary | ICD-10-CM | POA: Insufficient documentation

## 2014-12-02 DIAGNOSIS — I2510 Atherosclerotic heart disease of native coronary artery without angina pectoris: Secondary | ICD-10-CM | POA: Insufficient documentation

## 2014-12-02 DIAGNOSIS — Z952 Presence of prosthetic heart valve: Secondary | ICD-10-CM

## 2014-12-02 DIAGNOSIS — Z954 Presence of other heart-valve replacement: Secondary | ICD-10-CM

## 2014-12-02 LAB — POCT INR: INR: 2.7

## 2014-12-05 DIAGNOSIS — H4011X1 Primary open-angle glaucoma, mild stage: Secondary | ICD-10-CM | POA: Diagnosis not present

## 2014-12-12 ENCOUNTER — Other Ambulatory Visit: Payer: Self-pay | Admitting: Internal Medicine

## 2014-12-12 ENCOUNTER — Encounter: Payer: Self-pay | Admitting: Internal Medicine

## 2014-12-12 ENCOUNTER — Other Ambulatory Visit: Payer: Self-pay | Admitting: Cardiology

## 2014-12-12 ENCOUNTER — Other Ambulatory Visit: Payer: Self-pay | Admitting: Pharmacist Clinician (PhC)/ Clinical Pharmacy Specialist

## 2014-12-16 ENCOUNTER — Other Ambulatory Visit: Payer: Self-pay | Admitting: Geriatric Medicine

## 2014-12-16 MED ORDER — TRAZODONE HCL 50 MG PO TABS: 25.0000 mg | ORAL_TABLET | Freq: Every evening | ORAL | Status: DC | PRN

## 2014-12-18 ENCOUNTER — Encounter: Payer: Self-pay | Admitting: Internal Medicine

## 2014-12-19 ENCOUNTER — Encounter: Payer: Self-pay | Admitting: Internal Medicine

## 2014-12-21 IMAGING — CR DG CHEST 2V
2 series · 2 of 2 positions shown · non-contrast
Comparison: 10/29/2013

CLINICAL DATA: Followup pneumonia 10/29/2013

EXAM:
CHEST  2 VIEW

[view not recorded (1 of 2)]
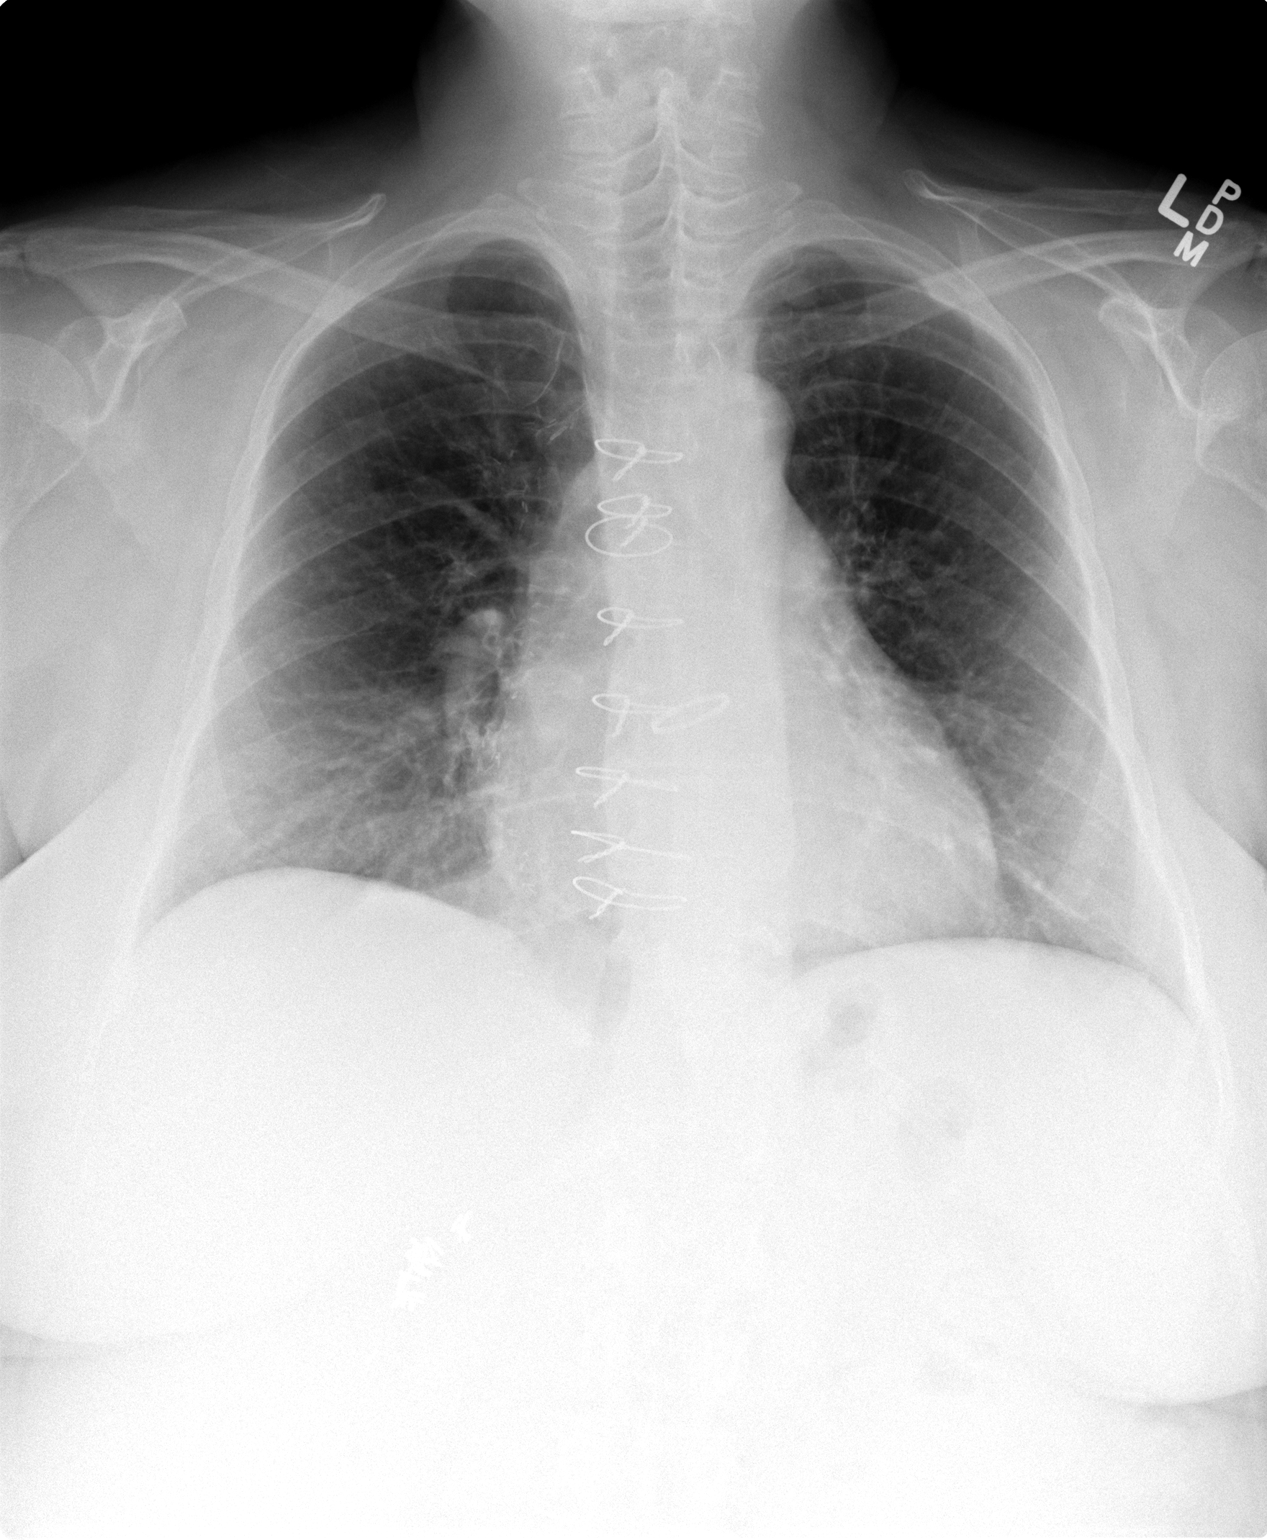

[view not recorded (2 of 2)]
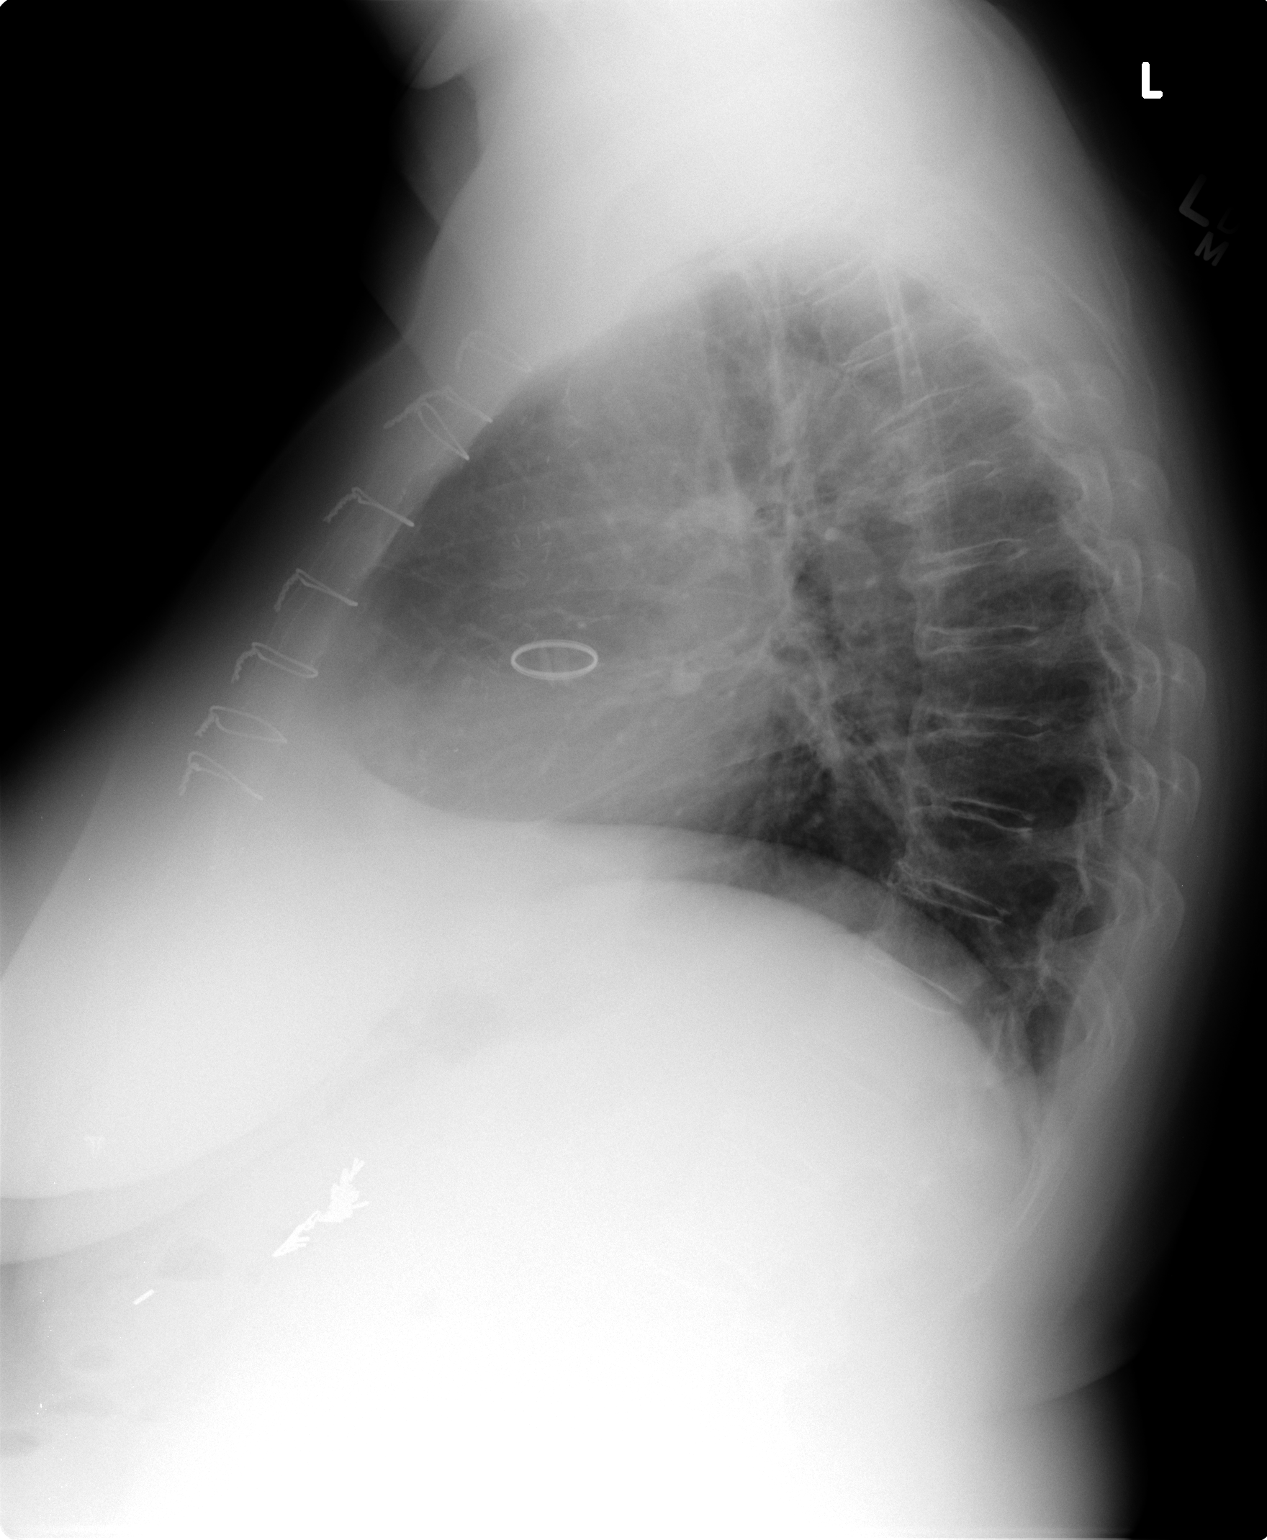

[2 of 2 positions shown; findings below may reference images not displayed]

FINDINGS: Stable mild cardiac enlargement. Replacement cardiac valve noted.
Vascular pattern normal. No consolidation or effusion. Mild
bronchial wall thickening and perihilar bronchiectasis.
IMPRESSION: No acute findings. No persistent infiltrate. Chronic bronchitic
change noted.

## 2014-12-23 ENCOUNTER — Ambulatory Visit: Payer: Medicare Other | Admitting: Pharmacist Clinician (PhC)/ Clinical Pharmacy Specialist

## 2014-12-23 ENCOUNTER — Other Ambulatory Visit: Payer: Self-pay | Admitting: Internal Medicine

## 2015-01-13 ENCOUNTER — Telehealth: Payer: Self-pay | Admitting: Cardiology

## 2015-01-13 ENCOUNTER — Ambulatory Visit (INDEPENDENT_AMBULATORY_CARE_PROVIDER_SITE_OTHER): Payer: Medicare Other | Admitting: Pharmacist Clinician (PhC)/ Clinical Pharmacy Specialist

## 2015-01-13 DIAGNOSIS — Z7901 Long term (current) use of anticoagulants: Secondary | ICD-10-CM | POA: Diagnosis not present

## 2015-01-13 DIAGNOSIS — Z952 Presence of prosthetic heart valve: Secondary | ICD-10-CM

## 2015-01-13 DIAGNOSIS — Z954 Presence of other heart-valve replacement: Secondary | ICD-10-CM | POA: Diagnosis not present

## 2015-01-13 LAB — PROTIME-INR
INR: 4 — ABNORMAL HIGH (ref <1.50)
Prothrombin Time: 39 seconds — ABNORMAL HIGH (ref 11.6–15.2)

## 2015-01-13 LAB — POCT INR: INR: 6.5

## 2015-01-13 NOTE — Telephone Encounter (Signed)
INR is 4.0 I instructed to hold in AM and Erasmo Downer will call with further instructions.

## 2015-01-14 ENCOUNTER — Telehealth: Payer: Self-pay | Admitting: Pharmacist Clinician (PhC)/ Clinical Pharmacy Specialist

## 2015-01-14 DIAGNOSIS — I5032 Chronic diastolic (congestive) heart failure: Secondary | ICD-10-CM

## 2015-01-14 DIAGNOSIS — I6523 Occlusion and stenosis of bilateral carotid arteries: Secondary | ICD-10-CM

## 2015-01-14 DIAGNOSIS — I739 Peripheral vascular disease, unspecified: Secondary | ICD-10-CM

## 2015-01-14 MED ORDER — ROSUVASTATIN CALCIUM 20 MG PO TABS: ORAL_TABLET | ORAL | Status: DC

## 2015-01-14 NOTE — Telephone Encounter (Signed)
Pt was in office for INR check.  Complained about muscle aches from taking Crestor 20 mg daily.  Advised that she hold x 2 weeks then restart at 1 tablet twice weekly.  She is to let me know at future INR appointments how she is tolerating, and we will increase as tolerated.

## 2015-01-17 ENCOUNTER — Other Ambulatory Visit: Payer: Self-pay | Admitting: Internal Medicine

## 2015-01-20 ENCOUNTER — Other Ambulatory Visit (INDEPENDENT_AMBULATORY_CARE_PROVIDER_SITE_OTHER): Payer: Medicare Other | Admitting: *Deleted

## 2015-01-20 DIAGNOSIS — I6523 Occlusion and stenosis of bilateral carotid arteries: Secondary | ICD-10-CM

## 2015-01-20 DIAGNOSIS — I5032 Chronic diastolic (congestive) heart failure: Secondary | ICD-10-CM | POA: Diagnosis not present

## 2015-01-20 DIAGNOSIS — I739 Peripheral vascular disease, unspecified: Secondary | ICD-10-CM

## 2015-01-20 NOTE — Addendum Note (Signed)
Addended by: Eulis Foster on: 01/20/2015 01:54 PM   Modules accepted: Orders

## 2015-01-24 NOTE — Telephone Encounter (Signed)
Coumadin refill. Thank you for your time. 

## 2015-01-28 ENCOUNTER — Encounter: Payer: Self-pay | Admitting: Internal Medicine

## 2015-01-28 ENCOUNTER — Ambulatory Visit (INDEPENDENT_AMBULATORY_CARE_PROVIDER_SITE_OTHER): Payer: Medicare Other | Admitting: Internal Medicine

## 2015-01-28 VITALS — BP 110/60 | HR 79 | Temp 99.2°F | Resp 18 | Ht 62.0 in | Wt 183.4 lb

## 2015-01-28 DIAGNOSIS — J019 Acute sinusitis, unspecified: Secondary | ICD-10-CM | POA: Diagnosis not present

## 2015-01-28 DIAGNOSIS — I6523 Occlusion and stenosis of bilateral carotid arteries: Secondary | ICD-10-CM

## 2015-01-28 MED ORDER — AZITHROMYCIN 250 MG PO TABS: ORAL_TABLET | ORAL | Status: DC

## 2015-01-28 NOTE — Progress Notes (Signed)
Pre visit review using our clinic review tool, if applicable. No additional management support is needed unless otherwise documented below in the visit note. 

## 2015-01-28 NOTE — Patient Instructions (Signed)
We have sent in an antibiotic called azithromycin. Take 2 pills today and then 1 pill a day after that until it's gone.  Medicine for cough include zyrtec or flonase over the counter. Zyrtec is a pill that you take once a day and flonase is a nose spray (take 2 sprays in each nostril once a day).   The cough may linger for several weeks after the head symptoms get better. If you are not improving by Friday give [place] a call back.

## 2015-01-29 ENCOUNTER — Telehealth: Payer: Self-pay | Admitting: *Deleted

## 2015-01-29 DIAGNOSIS — J019 Acute sinusitis, unspecified: Secondary | ICD-10-CM | POA: Insufficient documentation

## 2015-01-29 NOTE — Progress Notes (Signed)
   Subjective:    Patient ID: Abigail Oliver, female    DOB: Aug 06, 1945, 69 y.o.   MRN: [id number]  HPI The patient is a 69 YO female coming in for ear and head pain. She has been having fevers and chills for the last 3 days. Started with some ear congestion with nasal congestion about 5 days ago. Overall is getting worse. Some headaches as well. Denies sore throat and denies SOB. Some cough with yellow sputum. Has not tried anything for it.   Review of Systems  Constitutional: Positive for fever, chills, activity change and appetite change. Negative for fatigue.  HENT: Positive for congestion, ear discharge, ear pain, rhinorrhea and sinus pressure. Negative for facial swelling, postnasal drip, sore throat and trouble swallowing.   Eyes: Negative.   Respiratory: Positive for cough. Negative for chest tightness, shortness of breath and wheezing.   Cardiovascular: Negative for chest pain, palpitations and leg swelling.  Gastrointestinal: Negative for abdominal pain, diarrhea, constipation and abdominal distention.  Musculoskeletal: Positive for myalgias.      Objective:   Physical Exam  Constitutional: She appears well-developed and well-nourished.  HENT:  Head: Normocephalic and atraumatic.  Left and right ear with normal TM and redness and fullness of the ear canal. Nose with yellow crusting and swollen turbinates.   Eyes: EOM are normal.  Neck: Normal range of motion. No thyromegaly present.  Cardiovascular: Normal rate and regular rhythm.   Pulmonary/Chest: Effort normal and breath sounds normal. No respiratory distress. She has no wheezes. She has no rales.  Abdominal: Soft. Bowel sounds are normal. She exhibits no distension. There is no tenderness. There is no rebound.  Lymphadenopathy:    She has no cervical adenopathy.   Filed Vitals:   01/28/15 1612  BP: 110/60  Pulse: 79  Temp: 99.2 F (37.3 C)  TempSrc: Oral  Resp: 18  Height: 5\' 2"  (1.575 m)  Weight: 183 lb 6.4 oz  (83.19 kg)  SpO2: 97%      Assessment & Plan:

## 2015-01-29 NOTE — Telephone Encounter (Signed)
Pharmacist stephanie left msg on vm yesterday afternoon stating md rx azithromycin, but their is a drug interaction with azithromycin & citalopram & trazodone when taking together. Wanting to know is it ok to fill...Abigail Oliver

## 2015-01-29 NOTE — Telephone Encounter (Signed)
Yes, this is okay to fill.

## 2015-01-29 NOTE — Assessment & Plan Note (Signed)
Will do azithromycin given her allergies for sinus infection. Possibly viral but given her extended course with ongoing fevers will treat with antibiotics.

## 2015-01-29 NOTE — Telephone Encounter (Signed)
Notified pharmacist with md response../lmb 

## 2015-02-03 ENCOUNTER — Telehealth: Payer: Self-pay | Admitting: *Deleted

## 2015-02-03 NOTE — Telephone Encounter (Signed)
[place] [facility] - Client [facility] Patient Name: Abigail Oliver Gender: Female DOB: Oct 26, 1945 Age: 69 Y 8 M 21 D Return Phone Number: [phone] (Primary) Address: City/State/Zip: [place] Client [facility] - Client Client Site [place] - Day Physician [place], [place] Type Call Call Type Triage / Clinical Relationship To Patient Self Appointment Disposition EMR Appointment Not Necessary Info pasted into Epic No Return Phone Number [phone] (Primary) Chief Complaint BREATHING - shortness of breath or sounds breathless Initial Comment Caller states ears hurt and hard to breath can not swallow PreDisposition Call Doctor Nurse Assessment Nurse: Vevelyn Royals, RN, Verdis Frederickson Date/Time Eilene Ghazi Time): 01/25/2015 12:52:40 PM Confirm and document reason for call. If symptomatic, describe symptoms. ---Caller states ears hurt and hard to breath can not swallow. Symptoms started a couple days ago. Denies fever. Main symptom is throat. Has the patient traveled out of the country within the last 30 days? ---No Does the patient require triage? ---Yes Related visit to physician within the last 2 weeks? ---No Does the PT have any chronic conditions? (i.e. diabetes, asthma, etc.) ---Yes List chronic conditions. ---heart problems (plastic valve) CHF diabetes glaucoma osteoarthritis neuropathy Guidelines Guideline Title Affirmed Question Affirmed Notes Nurse Date/Time (Eastern Time) Sore Throat Patient sounds very sick or weak to the triager Patient is very hoarse, can barely talk, states husband says back of throat is white, Patient states it is difficult to swallow. Denies difficulty breathing. Vevelyn Royals, RN, Verdis Frederickson 01/25/2015 12:56:07 PM Disp. Time Eilene Ghazi Time) Disposition Final User 01/25/2015 12:50:43 PM Send to Urgent Viviana Simpler, Lorie 01/25/2015 1:01:31 PM Go to ED Now (or PCP triage) Yes  Vevelyn Royals, RN, Feliz Beam NOTE: All timestamps contained within this report are represented as [place] Standard Time. CONFIDENTIALTY NOTICE: This fax transmission is intended only for the addressee. It contains information that is legally privileged, confidential or otherwise protected from use or disclosure. If you are not the intended recipient, you are strictly prohibited from reviewing, disclosing, copying using or disseminating any of this information or taking any action in reliance on or regarding this information. If you have received this fax in error, please notify us immediately by telephone so that we can arrange for its return to [place]. Phone: [phone], Toll-Free: [phone], Fax: [phone] Page: 2 of 2 Call Id: [id number] Caller Understands: Yes Disagree/Comply: Comply Care Advice Given Per Guideline * IF NO PCP TRIAGE: You need to be seen. Go to the [facility] at _[facility] Hospital within the next hour. Leave as soon as you can. DRIVING: Another adult should drive. CARE ADVICE given per Sore Throat (Adult) guideline. After Care Instructions Given Call Event Type User Date / Time Description [facility]errals [facility]

## 2015-02-19 ENCOUNTER — Ambulatory Visit (INDEPENDENT_AMBULATORY_CARE_PROVIDER_SITE_OTHER): Payer: Medicare Other | Admitting: Pharmacist Clinician (PhC)/ Clinical Pharmacy Specialist

## 2015-02-19 ENCOUNTER — Other Ambulatory Visit: Payer: Self-pay | Admitting: Internal Medicine

## 2015-02-19 DIAGNOSIS — Z954 Presence of other heart-valve replacement: Secondary | ICD-10-CM | POA: Diagnosis not present

## 2015-02-19 DIAGNOSIS — Z7901 Long term (current) use of anticoagulants: Secondary | ICD-10-CM | POA: Diagnosis not present

## 2015-02-19 DIAGNOSIS — Z952 Presence of prosthetic heart valve: Secondary | ICD-10-CM

## 2015-02-19 LAB — POCT INR: INR: 5.6

## 2015-02-20 ENCOUNTER — Other Ambulatory Visit: Payer: Self-pay | Admitting: Internal Medicine

## 2015-02-21 ENCOUNTER — Ambulatory Visit (INDEPENDENT_AMBULATORY_CARE_PROVIDER_SITE_OTHER): Payer: Medicare Other | Admitting: Cardiology

## 2015-02-21 ENCOUNTER — Encounter: Payer: Self-pay | Admitting: Cardiology

## 2015-02-21 VITALS — BP 114/62 | HR 78 | Ht 62.0 in | Wt 186.0 lb

## 2015-02-21 DIAGNOSIS — Z954 Presence of other heart-valve replacement: Secondary | ICD-10-CM | POA: Diagnosis not present

## 2015-02-21 DIAGNOSIS — R918 Other nonspecific abnormal finding of lung field: Secondary | ICD-10-CM

## 2015-02-21 DIAGNOSIS — R29898 Other symptoms and signs involving the musculoskeletal system: Secondary | ICD-10-CM | POA: Diagnosis not present

## 2015-02-21 DIAGNOSIS — I6523 Occlusion and stenosis of bilateral carotid arteries: Secondary | ICD-10-CM | POA: Diagnosis not present

## 2015-02-21 DIAGNOSIS — E785 Hyperlipidemia, unspecified: Secondary | ICD-10-CM | POA: Diagnosis not present

## 2015-02-21 DIAGNOSIS — M6289 Other specified disorders of muscle: Secondary | ICD-10-CM

## 2015-02-21 DIAGNOSIS — I739 Peripheral vascular disease, unspecified: Secondary | ICD-10-CM

## 2015-02-21 DIAGNOSIS — Z9889 Other specified postprocedural states: Secondary | ICD-10-CM

## 2015-02-21 DIAGNOSIS — I5189 Other ill-defined heart diseases: Secondary | ICD-10-CM

## 2015-02-21 DIAGNOSIS — R531 Weakness: Secondary | ICD-10-CM

## 2015-02-21 DIAGNOSIS — I2510 Atherosclerotic heart disease of native coronary artery without angina pectoris: Secondary | ICD-10-CM

## 2015-02-21 DIAGNOSIS — Z952 Presence of prosthetic heart valve: Secondary | ICD-10-CM

## 2015-02-21 DIAGNOSIS — R2990 Unspecified symptoms and signs involving the nervous system: Secondary | ICD-10-CM | POA: Diagnosis not present

## 2015-02-21 DIAGNOSIS — I5032 Chronic diastolic (congestive) heart failure: Secondary | ICD-10-CM

## 2015-02-21 DIAGNOSIS — I519 Heart disease, unspecified: Secondary | ICD-10-CM

## 2015-02-21 DIAGNOSIS — Z959 Presence of cardiac and vascular implant and graft, unspecified: Secondary | ICD-10-CM

## 2015-02-21 LAB — CBC WITH DIFFERENTIAL/PLATELET
Basophils Absolute: 0.1 10*3/uL (ref 0.0–0.1)
Basophils Relative: 1 % (ref 0–1)
Eosinophils Absolute: 0.1 10*3/uL (ref 0.0–0.7)
Eosinophils Relative: 2 % (ref 0–5)
HCT: 34 % — ABNORMAL LOW (ref 36.0–46.0)
Hemoglobin: 11.4 g/dL — ABNORMAL LOW (ref 12.0–15.0)
Lymphocytes Relative: 39 % (ref 12–46)
Lymphs Abs: 2.8 10*3/uL (ref 0.7–4.0)
MCH: 28.9 pg (ref 26.0–34.0)
MCHC: 33.5 g/dL (ref 30.0–36.0)
MCV: 86.3 fL (ref 78.0–100.0)
MPV: 10.3 fL (ref 8.6–12.4)
Monocytes Absolute: 0.7 10*3/uL (ref 0.1–1.0)
Monocytes Relative: 9 % (ref 3–12)
Neutro Abs: 3.6 10*3/uL (ref 1.7–7.7)
Neutrophils Relative %: 49 % (ref 43–77)
Platelets: 181 10*3/uL (ref 150–400)
RBC: 3.94 MIL/uL (ref 3.87–5.11)
RDW: 15.6 % — ABNORMAL HIGH (ref 11.5–15.5)
WBC: 7.3 10*3/uL (ref 4.0–10.5)

## 2015-02-21 LAB — BASIC METABOLIC PANEL
BUN: 20 mg/dL (ref 7–25)
CO2: 28 mmol/L (ref 20–31)
Calcium: 10.3 mg/dL (ref 8.6–10.4)
Chloride: 97 mmol/L — ABNORMAL LOW (ref 98–110)
Creat: 1.09 mg/dL — ABNORMAL HIGH (ref 0.50–0.99)
Glucose, Bld: 126 mg/dL — ABNORMAL HIGH (ref 65–99)
Potassium: 4.3 mmol/L (ref 3.5–5.3)
Sodium: 136 mmol/L (ref 135–146)

## 2015-02-21 LAB — TSH: TSH: 2.51 u[IU]/mL (ref 0.350–4.500)

## 2015-02-21 NOTE — Patient Instructions (Addendum)
Medication Instructions:  Start aspirin 81mg  daily  Labwork: BMET/TSH/CBCd today Your physician recommends that you return for a FASTING lipid profile: in 2 months.   Testing/Procedures: MRI of brain with and without contrast.  Follow-Up: Your physician recommends that you schedule a follow-up appointment in: 2 months with Dr [name] in the [facility].   Any Other Special Instructions Will Be Listed Below (If Applicable). You have been referred to [facility].

## 2015-02-23 DIAGNOSIS — R531 Weakness: Secondary | ICD-10-CM | POA: Insufficient documentation

## 2015-02-23 NOTE — Progress Notes (Signed)
Patient ID: Abigail Oliver, female   DOB: 1945-09-11, 69 y.o.   MRN: [id number] PCP: Dr. Doug Sou  69 yo with history of aortic stenosis s/p St Jude mechanical AVR (6/00) and CAD s/p single vessel CABG (6/00) presents for cardiology evaluation.  She had severe aortic stenosis and therefore required CABG-AVR in 6/00.  She had a RIMA-RCA. She had a myoview in 4/13 with no ischemia or infarction, and echo in 6/14 showed normal EF with moderate diastolic dysfunction and a well-seated mechanical valve.    In 6/15, torsemide was stopped and she was begun on Lasix due to increased cost of torsemide.  She developed dyspnea and lower extremity edema.  She was admitted later that month with community-acquired PNA as well as CHF.  CT showed RLL PNA and multiple pulmonary nodules.  Echo in 6/15 showed EF 60-65%, mechanical aortic valve with mean gradient 27 mmHg. After discharge, she continued to be short of breath.  Lasix was increased to 80 mg bid.  This did not help much.  She was short of breath walking around in her yard.  No chest pain.  Lexiscan Cardiolite was done in 7/15 given the exertional symptoms and showed a small area of basal inferolateral ischemia.   Given ongoing exertional symptoms, I did a right and left heart catheterization in 7/15.  Right and left heart filling pressures were normal.  There was an 80% stenosis in the proximal LAD that was hemodynamically significant by FFR. Patient had DES to proximal LAD.    Main complaint currently is left-sided weakness and numbness involving the arm and leg.  This has been going on for about 10 days.  It seems to have started fairly suddenly while at work.  She fell once.  She has stable dyspnea after walking about 100 feet.  No chest pain.  She was off Crestor for about 2 months, now back to taking it again. Weight is up 3 lbs since last appointment.    Labs (6/14): BNP 216, HCT 32, K 4.8, creatinine 0.8 Labs (8/14): K 4.4, creatinine 0.8, BNP 55, HDL 45,  LDL 141 Labs (10/14): LDL 61, HDL 43, AST 40, ALT 31 Labs (6/15): BNP 37 Labs (7/15): K 4.4, creatinine 0.8 => 0.77 Labs (9/15): HCT 34.5, LDL 32, HDL 40 Labs (4/16): K 3.8, creatinine 0.96 Labs (5/16): K 4.4, creatinine 1.02, LDL 138 Labs (6/16): K 4.3, creatinine 1.34, BNP 83  PMH: 1. Aortic stenosis: Now has St Jude mechanical aortic valve (6/00).  Echo (6/14) with EF 60-65%, mild LVH, moderate diastolic dysfunction, normal RV, mechanical aortic valve with mean gradient 20 mmHg.  Echo (6/15) with EF 60-65%, mechanical aortic valve with mean gradient 27 mmHg.  2. CAD: CABG at time of AVR in 6/00 with RIMA-RCA.  Myoview in 4/13 with EF 72%, no ischemia or infarction. Lexiscan Cardiolite (7/15) with EF 69%, small area of basal inferolateral ischemia.  LHC (7/15) with patent RIMA-RCA, 80% mRCA, 80% pLAD with FFR 0.73, treated with DES to pLAD.  3. HTN 4. Type II diabetes 5. GERD 6. OSA 7. Hyperlipidemia 8. PAD: Right SFA stent. Peripheral arterial dopplers (7/15) with patent right SFA stent. Peripheral arterial dopplers (7/16) with right SFA stent patent.  9. Colon polyps 10. Carotid stenosis: Carotid dopplers (11/13) witih 50-69% RICA.  Carotid dopplers (7/15) with 40-59% RICA. Carotid dopplers (7/16) with 40-59% BICA stenosis.  11. Diastolic CHF 12. Gastritis 13. Dizziness: Holter (8/14) with few PACs, otherwise unremarkable.  14. Mild transaminase elevation 15.  Pulmonary nodules: Noted by CT in 6/15.  CT chest (8/15) was thought to indicate that nodules were benign.  No further workup was recommended.  16. Contrast allergy 17. Diastolic CHF: RHC (6/96) with mean RA 2, PA 22/11, mean PCWP 9, CI 3.79 18. Low back pain.   SH: Married, works as Theme park manager, nonsmoker.   FH: CAD  ROS: All systems reviewed and negative except as per HPI.   Current Outpatient Prescriptions  Medication Sig Dispense Refill  . Ascorbic Acid (VITAMIN C) 500 MG tablet Take 500 mg by mouth 2 (two) times  daily.     . calcium-vitamin D (CALCIUM 500+D) 500-200 MG-UNIT per tablet Take 1 tablet by mouth 2 (two) times daily.     . citalopram (CELEXA) 20 MG tablet Take 1 tablet (20 mg total) by mouth at bedtime. 30 tablet 5  . cyclobenzaprine (FLEXERIL) 5 MG tablet TAKE 1 TABLET (5 MG TOTAL) BY MOUTH 2 (TWO) TIMES DAILY AS NEEDED FORMUSCLE SPASMS. 60 tablet 2  . ferrous sulfate 325 (65 FE) MG tablet Take 1 tablet (325 mg total) by mouth daily. 30 tablet 0  . gabapentin (NEURONTIN) 300 MG capsule Take 300 mg by mouth as needed. FOR NEUROPATHY    . latanoprost (XALATAN) 0.005 % ophthalmic solution PLACE 1 DROP INTO BOTH EYES AT BEDTIME. 2.5 mL 2  . losartan (COZAAR) 25 MG tablet Take 1 tablet (25 mg total) by mouth daily. 90 tablet 3  . metFORMIN (GLUCOPHAGE) 1000 MG tablet TAKE 1 TABLET (1,000 MG TOTAL) BY MOUTH 2 (TWO) TIMES DAILY WITH A MEAL. 60 tablet 11  . metoprolol succinate (TOPROL-XL) 50 MG 24 hr tablet TAKE 1 TABLET (50 MG TOTAL) BY MOUTH DAILY. 30 tablet 11  . nitroGLYCERIN (NITROSTAT) 0.4 MG SL tablet Place 1 tablet (0.4 mg total) under the tongue every 5 (five) minutes as needed for chest pain. 25 tablet 3  . pantoprazole (PROTONIX) 40 MG tablet Take 1 tablet (40 mg total) by mouth daily. 90 tablet 3  . potassium chloride SA (K-DUR,KLOR-CON) 20 MEQ tablet 2 tablets (40 mEq) by mouth two times a day 360 tablet 3  . rosuvastatin (CRESTOR) 20 MG tablet Take 1 tablet by mouth twice weekly. 90 tablet 0  . torsemide (DEMADEX) 20 MG tablet 3 tablets (60mg ) by mouth in the AM and 2 tablets (40mg ) by mouth in the PM 450 tablet 3  . traZODone (DESYREL) 50 MG tablet TAKE 0.5-1 TABLETS (25-50 MG TOTAL) BY MOUTH AT BEDTIME AS NEEDED FOR SLEEP. 30 tablet 0  . warfarin (COUMADIN) 3 MG tablet Take as directed by coumadin clinic 40 tablet 2  . aspirin EC 81 MG tablet Take 1 tablet (81 mg total) by mouth daily.     No current facility-administered medications for this visit.    BP 114/62 mmHg  Pulse 78   Ht 5\' 2"  (1.575 m)  Wt 186 lb (84.369 kg)  BMI 34.01 kg/m2 General: NAD Neck: JVP 7 cm, no thyromegaly or thyroid nodule.  Lungs: Clear to auscultation bilaterally with normal respiratory effort. CV: Nondisplaced PMI.  Heart regular S1/S2, mechanical S2, no S3/S4, 1/6 early SEM RUSB.  No ankle edema.  No carotid bruit.  Normal pedal pulses.  Abdomen: Soft, nontender, no hepatosplenomegaly, mild distention.  Neurologic: Alert and oriented x 3.  Psych: Normal affect. Extremities: No clubbing or cyanosis.    Assessment/Plan: 1. St Jude mechanical aortic valve:  Mildly elevated mean gradient for this valve on echo in 6/15.  Continue coumadin.  -  Add ASA 81 daily.  - CBC today.  2. Chronic diastolic CHF: NYHA class II-III symptoms, stable.  She is not volume overloaded on exam.  - Check BMET today.   - Continue current torsemide and KCl.  3. CAD: s/p RIMA-RCA at time of AVR.  She developed exertional dyspnea and had cath showing significant proximal LAD stenosis in 7/15 that was treated with BMS.  She feels much better since PCI.  - She is now off Plavix.  Given mechanical valve, can go back to regimen of ASA 81 daily + warfarin.  - Continue statin.   4. Hyperlipidemia: Myalgias with atorvastatin and simvastatin.  She is now on Crestor after stopping it for a couple of months.  Will wait 2 more months and check lipids/LFTs. 5. Carotid stenosis: Repeat 7/17.  6. PAD: Known PAD with right SFA stent.  Last peripheral arterial dopplers showed patent R SFA stent.   7. Neuro: With left-sided weakness/numbness, I am concerned for the possibility of CVA.  I will get an MRI of her head and refer her to a neurologist for evaluation.    Loralie Champagne 02/23/2015

## 2015-02-24 ENCOUNTER — Ambulatory Visit (HOSPITAL_COMMUNITY)
Admission: RE | Admit: 2015-02-24 | Discharge: 2015-02-24 | Disposition: A | Discharge status: Home / Self Care | Payer: Medicare Other | Source: Ambulatory Visit | Attending: Cardiology | Admitting: Cardiology

## 2015-02-24 DIAGNOSIS — R2990 Unspecified symptoms and signs involving the nervous system: Secondary | ICD-10-CM | POA: Insufficient documentation

## 2015-02-24 DIAGNOSIS — R51 Headache: Secondary | ICD-10-CM | POA: Diagnosis not present

## 2015-02-24 DIAGNOSIS — R531 Weakness: Secondary | ICD-10-CM | POA: Diagnosis not present

## 2015-02-24 DIAGNOSIS — Z954 Presence of other heart-valve replacement: Secondary | ICD-10-CM | POA: Insufficient documentation

## 2015-02-24 DIAGNOSIS — I739 Peripheral vascular disease, unspecified: Secondary | ICD-10-CM | POA: Insufficient documentation

## 2015-02-24 DIAGNOSIS — I2510 Atherosclerotic heart disease of native coronary artery without angina pectoris: Secondary | ICD-10-CM | POA: Diagnosis not present

## 2015-02-24 DIAGNOSIS — Z952 Presence of prosthetic heart valve: Secondary | ICD-10-CM

## 2015-02-24 DIAGNOSIS — R410 Disorientation, unspecified: Secondary | ICD-10-CM | POA: Diagnosis not present

## 2015-02-24 DIAGNOSIS — R29898 Other symptoms and signs involving the musculoskeletal system: Secondary | ICD-10-CM | POA: Insufficient documentation

## 2015-02-24 MED ORDER — GADOBENATE DIMEGLUMINE 529 MG/ML IV SOLN
20.0000 mL | Freq: Once | INTRAVENOUS | Status: AC | PRN
  Administered 2015-02-24: 17 mL via INTRAVENOUS

## 2015-02-25 ENCOUNTER — Telehealth: Payer: Self-pay | Admitting: Cardiology

## 2015-02-25 NOTE — Telephone Encounter (Signed)
New message ° ° ° ° °Returning a call to the nurse °

## 2015-02-25 NOTE — Telephone Encounter (Signed)
Spoke with patient about recent MRI brain results.

## 2015-02-25 NOTE — Telephone Encounter (Signed)
LMTCB

## 2015-02-26 ENCOUNTER — Ambulatory Visit (INDEPENDENT_AMBULATORY_CARE_PROVIDER_SITE_OTHER): Payer: Medicare Other | Admitting: Pharmacist Clinician (PhC)/ Clinical Pharmacy Specialist

## 2015-02-26 DIAGNOSIS — Z954 Presence of other heart-valve replacement: Secondary | ICD-10-CM

## 2015-02-26 DIAGNOSIS — Z7901 Long term (current) use of anticoagulants: Secondary | ICD-10-CM

## 2015-02-26 DIAGNOSIS — Z952 Presence of prosthetic heart valve: Secondary | ICD-10-CM

## 2015-02-26 LAB — POCT INR: INR: 3.6

## 2015-03-05 ENCOUNTER — Ambulatory Visit
Admission: RE | Admit: 2015-03-05 | Discharge: 2015-03-05 | Disposition: A | Discharge status: Home / Self Care | Payer: Medicare Other | Source: Ambulatory Visit | Attending: Neurology | Admitting: Neurology

## 2015-03-05 ENCOUNTER — Ambulatory Visit (INDEPENDENT_AMBULATORY_CARE_PROVIDER_SITE_OTHER): Payer: Medicare Other | Admitting: Neurology

## 2015-03-05 ENCOUNTER — Encounter: Payer: Self-pay | Admitting: Neurology

## 2015-03-05 VITALS — BP 120/70 | HR 72 | Ht 62.0 in | Wt 188.0 lb

## 2015-03-05 DIAGNOSIS — R29898 Other symptoms and signs involving the musculoskeletal system: Secondary | ICD-10-CM

## 2015-03-05 DIAGNOSIS — M25552 Pain in left hip: Secondary | ICD-10-CM | POA: Diagnosis not present

## 2015-03-05 DIAGNOSIS — M4806 Spinal stenosis, lumbar region: Secondary | ICD-10-CM

## 2015-03-05 DIAGNOSIS — M542 Cervicalgia: Secondary | ICD-10-CM | POA: Diagnosis not present

## 2015-03-05 DIAGNOSIS — E1143 Type 2 diabetes mellitus with diabetic autonomic (poly)neuropathy: Secondary | ICD-10-CM | POA: Diagnosis not present

## 2015-03-05 DIAGNOSIS — I6523 Occlusion and stenosis of bilateral carotid arteries: Secondary | ICD-10-CM

## 2015-03-05 DIAGNOSIS — M48061 Spinal stenosis, lumbar region without neurogenic claudication: Secondary | ICD-10-CM

## 2015-03-05 NOTE — Patient Instructions (Addendum)
To assess for pinched nerve in neck, we will get MRI of cervical spine without contrast We will get X-ray of the left hip to look for arthritis. Follow up in 2 months.  Will contact you with test results and recommendations  You have been scheduled at [facility] for MRI on 03/13/15   Please arrive @ 10:00 AM      [place], [place] [id number]    [phone]

## 2015-03-05 NOTE — Progress Notes (Signed)
NEUROLOGY CONSULTATION NOTE  Abigail Oliver MRN: [id number] DOB: December 02, 1945  Referring provider: Dr. [name] Primary care provider: Dr. Sharlet Salina  Reason for consult:  Left arm weakness  HISTORY OF PRESENT ILLNESS: Abigail Oliver is a 69 year old right-handed female with aortic stenosis status post St. Jude mechanical AVR, CAD status post single vessel CABG, hypertension, type 2 diabetes, hyperlipidemia, OSA, PAD status post right SFA stent and diastolic CHF who presents for left arm weakness.  History obtained by patient and cardiology notes.  Images of brain MRI, plain films of cervical spine and shoulder reviewed.  Labs reviewed.  Last year, she had a fall, in which she fell on the left arm, causing bruising.  Afterwards, she noted shoulder pain, left-sided neck pain and weakness in the left upper extremity.  She was diagnosed with partial rotator cuff tear.  She was treated with an intra-articular injection.  She did well for a little while, but the pain and weakness returned.  For neck pain, plain films of cervical spine were performed on 04/17/15, which revealed slight osteoarthritic changes with mild exit foraminal narrowing at C5-6 bilaterally due to bony hypertrophy.  Left shoulder plain films from 04/16/14 showed mild left acromioclavicular joint osteoarthritis.  She was treated with another injection and advised on home exercises for her shoulder and arm.  Over the past few months, the pain and weakness returned.  She also has a history of osteoarthritis and lumbar stenosis.  Over the past few months, she also reports left hip pain radiating down to and involving the left knee.  DEXA scan in July revealed low bone mass in the lumbar spine.  To assess the left sided weakness, an MRI of the brain with and without contrast performed on 02/24/15 showed mild to moderate chronic small vessel disease and global atrophy, but no acute intracranial abnormalities.  Most recent BMP demonstrated  a serum glucose level of 126.    PAST MEDICAL HISTORY: Past Medical History  Diagnosis Date  . Coronary atherosclerosis of native coronary artery   . Hyperlipidemia   . Chronic angle-closure glaucoma(365.23)   . Aortic valve disorders   . Peripheral vascular disease ([place])   . Type II diabetes mellitus ([place])   . Depression   . PONV (postoperative nausea and vomiting)     N&V  . CHF (congestive heart failure) (H[facility])     diastolic heart failure  . GERD (gastroesophageal reflux disease)   . Neuromuscular disorder ([facility])     neuropathy  . Arthritis     osteo  . Anemia   . Syncope 03/29/2012    carotid doppler - R ICA 50-69% reduction by velocities (low end); L ICA 0-49% reduction by velocities (low end); R and L subclavian arteries - <50% redcution; R and L vertebral arteries show normal antegrade flow  . Dizziness 02/09/2012    Holter monitor - sinus rhythm, no ventricular ectopic beats, no sulpraventricular ectopic beats noted  . OSA (obstructive sleep apnea)   . Abnormal stress test, 7/15, + ischemia 12/15/2013  . S/P arterial stent-to LAD, Promus DES 12/14/13 12/15/2013    PAST SURGICAL HISTORY: Past Surgical History  Procedure Laterality Date  . Carpal tunnel release  1989    bilaterally  . Cholecystectomy  1990  . Peripheral arterial stent graft  09/20/11    right SFA  . Cardiac valve replacement  2000    aortic valve   . Laceration repair      right hand  . Neuroplasty /  transposition ulnar nerve at elbow      right  . Tonsillectomy  1968  . Vaginal hysterectomy  1985    Fibroids  . Bilateral oophorectomy  1987  . Cesarean section  1969; 1971  . Breast biopsy  2012    left  . Lymph node biopsy  06/2011    "core needle on 5"  . Cataract extraction w/ intraocular lens  implant, bilateral  ~ 2007  . Refractive surgery  ` 2004    "for glaucoma"  . Needle biopsy  2009    "on ankles for nerve damage"  . Esophagogastroduodenoscopy N/A 10/27/2012    Procedure:  ESOPHAGOGASTRODUODENOSCOPY (EGD);  Surgeon: Beryle Beams, MD;  Location: [facility];  Service: Endoscopy;  Laterality: N/A;  . Colonoscopy N/A 10/27/2012    Procedure: COLONOSCOPY;  Surgeon: Beryle Beams, MD;  Location: [facility];  Service: Endoscopy;  Laterality: N/A;  . Doppler echocardiography  03/29/2012    EF >55%; mild concentric LVH; stage 1 diastolic dysfunction, elevated LV filling pressure, dilated LA; MAC mild MR; St Jude AVR peak and mean gradients of 20mmHg and 34mmHg; transvalvular gradients have increased (prev 23 and 14 respectively)  . Cardiovascular stress test  08/25/2011    R/L MV - EF 72%; no scintigraphic evidence of inducible MI; normal perfusionTID of 1.25 elevated - could indicate small vessle subendocardial ischemia; EKG NSR at 66, non diagnostic for ischemia  . Cardiac catheterization  01/14/1999    normal LV function, severe aortic stenosis; 80% and 70% stenosis in RCA; mild 20% distal norrowing in L main with 20% proximal LAD stenosis, 40% diagonal stenosis and 20% proximal circumflex stenosis  . Femoral artery stent  09/20/2011    6 x 40 Smart Nitinol self-expanding stent placed;  10/15/2031 -R SFA stent open and patent w/o evidence of restenosis  . Coronary artery bypass graft  2000    RIMA to RCA  . Lower extremity angiogram N/A 09/20/2011    Procedure: LOWER EXTREMITY ANGIOGRAM;  Surgeon: Lorretta Harp, MD;  Location: [facility] CATH LAB;  Service: Cardiovascular;  Laterality: N/A;  . Left and right heart catheterization with coronary angiogram N/A 12/14/2013    Procedure: LEFT AND RIGHT HEART CATHETERIZATION WITH CORONARY ANGIOGRAM;  Surgeon: Larey Dresser, MD;  Location: [facility] CATH LAB;  Service: Cardiovascular;  Laterality: N/A;  . Fractional flow reserve wire  12/14/2013    Procedure: FRACTIONAL FLOW RESERVE WIRE;  Surgeon: Larey Dresser, MD;  Location[facility] C[facility] LAB;  Service: Cardiovascular;;  . Percutaneous coronary stent intervention (pci-s)  12/14/2013     Procedure: PERCUTANEOUS CORONARY STENT INTERVENTION (PCI-S);  Surgeon: Larey Dresser, MD;  Location: [facility] CATH LAB;  Service: Cardiovascular;;  Prox LAD 3.00x12 Promus DES     MEDICATIONS: Current Outpatient Prescriptions on File Prior to Visit  Medication Sig Dispense Refill  . Ascorbic Acid (VITAMIN C) 500 MG tablet Take 500 mg by mouth 2 (two) times daily.     Marland Kitchen aspirin EC 81 MG tablet Take 1 tablet (81 mg total) by mouth daily.    . calcium-vitamin D (CALCIUM 500+D) 500-200 MG-UNIT per tablet Take 1 tablet by mouth 2 (two) times daily.     . citalopram (CELEXA) 20 MG tablet Take 1 tablet (20 mg total) by mouth at bedtime. 30 tablet 5  . cyclobenzaprine (FLEXERIL) 5 MG tablet TAKE 1 TABLET (5 MG TOTAL) BY MOUTH 2 (TWO) TIMES DAILY AS NEEDED FORMUSCLE SPASMS. 60 tablet 2  . ferrous sulfate 325 (  65 FE) MG tablet Take 1 tablet (325 mg total) by mouth daily. 30 tablet 0  . gabapentin (NEURONTIN) 300 MG capsule Take 300 mg by mouth as needed. FOR NEUROPATHY    . latanoprost (XALATAN) 0.005 % ophthalmic solution PLACE 1 DROP INTO BOTH EYES AT BEDTIME. 2.5 mL 2  . losartan (COZAAR) 25 MG tablet Take 1 tablet (25 mg total) by mouth daily. 90 tablet 3  . metFORMIN (GLUCOPHAGE) 1000 MG tablet TAKE 1 TABLET (1,000 MG TOTAL) BY MOUTH 2 (TWO) TIMES DAILY WITH A MEAL. 60 tablet 11  . metoprolol succinate (TOPROL-XL) 50 MG 24 hr tablet TAKE 1 TABLET (50 MG TOTAL) BY MOUTH DAILY. 30 tablet 11  . pantoprazole (PROTONIX) 40 MG tablet Take 1 tablet (40 mg total) by mouth daily. 90 tablet 3  . potassium chloride SA (K-DUR,KLOR-CON) 20 MEQ tablet 2 tablets (40 mEq) by mouth two times a day 360 tablet 3  . rosuvastatin (CRESTOR) 20 MG tablet Take 1 tablet by mouth twice weekly. 90 tablet 0  . torsemide (DEMADEX) 20 MG tablet 3 tablets (60mg ) by mouth in the AM and 2 tablets (40mg ) by mouth in the PM 450 tablet 3  . traZODone (DESYREL) 50 MG tablet TAKE 0.5-1 TABLETS (25-50 MG TOTAL) BY MOUTH AT BEDTIME AS NEEDED  FOR SLEEP. 30 tablet 0  . warfarin (COUMADIN) 3 MG tablet Take as directed by coumadin clinic 40 tablet 2  . nitroGLYCERIN (NITROSTAT) 0.4 MG SL tablet Place 1 tablet (0.4 mg total) under the tongue every 5 (five) minutes as needed for chest pain. (Patient not taking: Reported on 03/05/2015) 25 tablet 3   No current facility-administered medications on file prior to visit.    ALLERGIES: Allergies  Allergen Reactions  . Atorvastatin     Muscle pain  . Simvastatin Other (See Comments)    Muscle pain  . Sulfa Antibiotics   . Iodinated Diagnostic Agents Rash     Red rash after cardiac cath 1 wk ago, ? Contrast allergy, requires 13 hr prep now per [email or web address]    FAMILY HISTORY: Family History  Problem Relation Age of Onset  . Colon cancer Mother   . COPD Mother   . Emphysema Mother   . Diabetes Neg Hx   . Cancer Maternal Grandmother   . Cancer Maternal Grandfather   . Heart disease Brother     SOCIAL HISTORY: Social History   Social History  . Marital Status: Married    Spouse Name: N/A  . Number of Children: N/A  . Years of Education: N/A   Occupational History  . Not on file.   Social History Main Topics  . Smoking status: Never Smoker   . Smokeless tobacco: Never Used  . Alcohol Use: No  . Drug Use: No  . Sexual Activity: No   Other Topics Concern  . Not on file   Social History Narrative   Pt is a high school graduate with 2 years of college. Married in 1967 she has 1 son born 51 and 1 daughter born 41 and 1 grandchild. 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    REVIEW OF SYSTEMS: Constitutional: No fevers, chills, or sweats, no generalized fatigue, change in appetite Eyes: No visual changes, double vision, eye pain Ear, nose and throat: No hearing loss, ear pain, nasal congestion, sore throat Cardiovascular: No chest pain, palpitations Respiratory:  No shortness of breath at rest or with exertion,  wheezes GastrointestinaI: No nausea,  vomiting, diarrhea, abdominal pain, fecal incontinence Genitourinary:  No dysuria, urinary retention or frequency Musculoskeletal:  Neck pain, left shoulder pain, back pain, left hip pain, left knee pain Integumentary: No rash, pruritus, skin lesions Neurological: as above Psychiatric: No depression, insomnia, anxiety Endocrine: No palpitations, fatigue, diaphoresis, mood swings, change in appetite, change in weight, increased thirst Hematologic/Lymphatic:  No anemia, purpura, petechiae. Allergic/Immunologic: no itchy/runny eyes, nasal congestion, recent allergic reactions, rashes  PHYSICAL EXAM: Filed Vitals:   03/05/15 1228  BP: 120/70  Pulse: 72   General: No acute distress.  Patient appears well-groomed. Head:  Normocephalic/atraumatic Eyes:  fundi unremarkable, without vessel changes, exudates, hemorrhages or papilledema. Neck: supple, left sided paraspinal tenderness, full range of motion Back: bilateral paraspinal tenderness Heart: regular rate and rhythm Lungs: Clear to auscultation bilaterally. Vascular: No carotid bruits. Neurological Exam: Mental status: alert and oriented to person, place, and time, recent and remote memory intact, fund of knowledge intact, attention and concentration intact, speech fluent and not dysarthric, language intact. Cranial nerves: CN I: not tested CN II: pupils equal, round and reactive to light, visual fields intact, fundi unremarkable, without vessel changes, exudates, hemorrhages or papilledema. CN III, IV, VI:  full range of motion, no nystagmus, no ptosis CN V: facial sensation intact CN VII: upper and lower face symmetric CN VIII: hearing intact CN IX, X: gag intact, uvula midline CN XI: sternocleidomastoid and trapezius muscles intact CN XII: tongue midline Bulk & Tone: normal, no fasciculations. Motor:  5-/5 left deltoid, triceps and grip.  Otherwise 5/5 Sensation:  Patchy decreased pinprick  sensation in the lower extremities.  Decreased sensation in the 2nd and 5th digits of left hand.  Decreased vibration sensation in toes. Deep Tendon Reflexes:  2+ throughout, toes downgoing.  Finger to nose testing:  Without dysmetria.  Heel to shin:  Without dysmetria.  Gait:  Left limp.  Able to turn, difficulty with tandem walking. Romberg positive. Positive FABER test.  IMPRESSION: Left sided arm pain and weakness, possibly related to cervical radiculopathy or due to shoulder. Left sided hip pain and back pain.  Positive FABER test suggests hip pathology.  She has back pain with known lumbar stenosis as well. Diabetic neuropathy  PLAN: 1.  Check MRI of cervical spine to evaluate structural etiology for left sided neck pain and arm weakness 2.  X-rays of left hip to evaluate for arthritis 3.  Will contact patient with results and further recommendations.  Thank you for allowing me to take part in the care of this patient.  Metta Clines, DO  CC:  Pricilla Holm, MD  Loralie Champagne, MD

## 2015-03-05 NOTE — Addendum Note (Signed)
Addended by: Chester Holstein on: 03/05/2015 01:38 PM   Modules accepted: Orders

## 2015-03-05 NOTE — Progress Notes (Signed)
Note routed

## 2015-03-07 ENCOUNTER — Ambulatory Visit (HOSPITAL_COMMUNITY): Payer: Medicare Other

## 2015-03-12 ENCOUNTER — Ambulatory Visit (INDEPENDENT_AMBULATORY_CARE_PROVIDER_SITE_OTHER): Payer: Medicare Other | Admitting: Pharmacist Clinician (PhC)/ Clinical Pharmacy Specialist

## 2015-03-12 DIAGNOSIS — Z7901 Long term (current) use of anticoagulants: Secondary | ICD-10-CM

## 2015-03-12 DIAGNOSIS — Z954 Presence of other heart-valve replacement: Secondary | ICD-10-CM

## 2015-03-12 DIAGNOSIS — Z952 Presence of prosthetic heart valve: Secondary | ICD-10-CM

## 2015-03-12 LAB — POCT INR: INR: 3

## 2015-03-24 DIAGNOSIS — M50221 Other cervical disc displacement at C4-C5 level: Secondary | ICD-10-CM | POA: Diagnosis not present

## 2015-03-24 DIAGNOSIS — M47812 Spondylosis without myelopathy or radiculopathy, cervical region: Secondary | ICD-10-CM | POA: Diagnosis not present

## 2015-03-26 ENCOUNTER — Telehealth: Payer: Self-pay

## 2015-03-26 NOTE — Telephone Encounter (Signed)
Relayed message to patient. Patient refused NCV-EMG! Patient says she's had one in the past and doesn't want to go through that again. Patient says she'll assume its due to her neuropathy and rotator cuff injury and just deal with it.

## 2015-03-26 NOTE — Telephone Encounter (Signed)
-----   Message from Abigail Partridge, DO sent at 03/25/2015  2:08 PM EDT ----- MRI of cervical spine does not show any significant disc bulges that are pressing on nerves.  To further evaluate left arm pain and weakness, I would like to get NCV-EMG of the left upper extremity

## 2015-04-02 ENCOUNTER — Ambulatory Visit (INDEPENDENT_AMBULATORY_CARE_PROVIDER_SITE_OTHER): Payer: Medicare Other | Admitting: Pharmacist Clinician (PhC)/ Clinical Pharmacy Specialist

## 2015-04-02 DIAGNOSIS — Z7901 Long term (current) use of anticoagulants: Secondary | ICD-10-CM

## 2015-04-02 DIAGNOSIS — Z954 Presence of other heart-valve replacement: Secondary | ICD-10-CM | POA: Diagnosis not present

## 2015-04-02 DIAGNOSIS — Z952 Presence of prosthetic heart valve: Secondary | ICD-10-CM

## 2015-04-02 LAB — POCT INR: INR: 2.8

## 2015-04-12 ENCOUNTER — Other Ambulatory Visit: Payer: Self-pay | Admitting: Cardiovascular Disease

## 2015-04-12 ENCOUNTER — Other Ambulatory Visit: Payer: Self-pay | Admitting: Internal Medicine

## 2015-04-14 ENCOUNTER — Other Ambulatory Visit (INDEPENDENT_AMBULATORY_CARE_PROVIDER_SITE_OTHER): Payer: Medicare Other | Admitting: *Deleted

## 2015-04-14 DIAGNOSIS — E785 Hyperlipidemia, unspecified: Secondary | ICD-10-CM

## 2015-04-14 DIAGNOSIS — I25810 Atherosclerosis of coronary artery bypass graft(s) without angina pectoris: Secondary | ICD-10-CM | POA: Diagnosis not present

## 2015-04-14 LAB — LIPID PANEL
Cholesterol: 104 mg/dL — ABNORMAL LOW (ref 125–200)
HDL: 39 mg/dL — ABNORMAL LOW (ref >=46)
LDL Cholesterol: 34 mg/dL (ref <130)
Total CHOL/HDL Ratio: 2.7 Ratio (ref <=5.0)
Triglycerides: 157 mg/dL — ABNORMAL HIGH (ref <150)
VLDL: 31 mg/dL — ABNORMAL HIGH (ref <30)

## 2015-04-14 LAB — HEPATIC FUNCTION PANEL
ALT: 16 U/L (ref 6–29)
AST: 23 U/L (ref 10–35)
Albumin: 4.3 g/dL (ref 3.6–5.1)
Alkaline Phosphatase: 62 U/L (ref 33–130)
Bilirubin, Direct: 0.2 mg/dL (ref <=0.2)
Indirect Bilirubin: 0.6 mg/dL (ref 0.2–1.2)
Total Bilirubin: 0.8 mg/dL (ref 0.2–1.2)
Total Protein: 7.4 g/dL (ref 6.1–8.1)

## 2015-04-14 NOTE — Addendum Note (Signed)
Addended by: Eulis Foster on: 04/14/2015 08:14 AM   Modules accepted: Orders

## 2015-04-28 ENCOUNTER — Ambulatory Visit: Payer: Medicare Other | Admitting: Pharmacist Clinician (PhC)/ Clinical Pharmacy Specialist

## 2015-05-05 ENCOUNTER — Ambulatory Visit (INDEPENDENT_AMBULATORY_CARE_PROVIDER_SITE_OTHER): Payer: Medicare Other | Admitting: Pharmacist Clinician (PhC)/ Clinical Pharmacy Specialist

## 2015-05-05 ENCOUNTER — Ambulatory Visit: Payer: Medicare Other | Admitting: Neurology

## 2015-05-05 DIAGNOSIS — Z954 Presence of other heart-valve replacement: Secondary | ICD-10-CM

## 2015-05-05 DIAGNOSIS — Z7901 Long term (current) use of anticoagulants: Secondary | ICD-10-CM

## 2015-05-05 DIAGNOSIS — Z952 Presence of prosthetic heart valve: Secondary | ICD-10-CM

## 2015-05-05 LAB — POCT INR: INR: 3.3

## 2015-05-09 ENCOUNTER — Other Ambulatory Visit: Payer: Self-pay | Admitting: Internal Medicine

## 2015-05-12 ENCOUNTER — Telehealth: Payer: Self-pay | Admitting: Internal Medicine

## 2015-05-12 ENCOUNTER — Telehealth: Payer: Self-pay | Admitting: Emergency Medicine

## 2015-05-12 NOTE — Telephone Encounter (Signed)
All providers are filled at 100% today.   Spoke to pt and pt is refusing to go to the ER. Pt stated that she would like to have blood work done first.

## 2015-05-12 NOTE — Telephone Encounter (Signed)
Patient Name: Abigail Oliver  DOB: 08-Nov-1945    Initial Comment Caller states she has been dizzy for the last four days, she has fell 4 times. She's on [place] and has a big bruise on her under arm left side, and it's spreading. Egg size lump under it.   Nurse Assessment  Nurse: Verlin Fester RN, Stanton Kidney Date/Time Eilene Ghazi Time): 05/12/2015 11:20:19 AM  Confirm and document reason for call. If symptomatic, describe symptoms. ---Patient states she has been dizzy for 2-3 months and the doctor sent her for tests and they haven't found anything. The dizziness is worse and she has fallen 4 times and he is taking Coumadin and she has a big lump under her left arm and a dark purple bruise the size of an egg  Has the patient traveled out of the country within the last 30 days? ---No  Does the patient have any new or worsening symptoms? ---Yes  Will a triage be completed? ---Yes  Related visit to physician within the last 2 weeks? ---No  Does the PT have any chronic conditions? (i.e. diabetes, asthma, etc.) ---Yes  List chronic conditions. ---"glaucoma, CHF, osteoporosis, diabetes, she has a plastic aortic valve"  Is this a behavioral health or substance abuse call? ---No     Guidelines    Guideline Title Affirmed Question Affirmed Notes  Dizziness - Vertigo [1] Dizziness (vertigo) present now AND [2] age > 70 (Exception: prior physician evaluation for this AND no different/worse than usual)    Final Disposition User   Go to ED Now (or PCP triage) Verlin Fester, RN, Stanton Kidney    Comments  After triage patient states she does not want to go to ED   Referrals  [place] REFUSED   Disagree/Comply: Disagree  Disagree/Comply Reason: Disagree with instructions

## 2015-05-12 NOTE — Telephone Encounter (Signed)
Needs to be seen

## 2015-05-12 NOTE — Telephone Encounter (Signed)
RN from after hours nurse line called in to advise that pt called nurse line and was instructed to go to ED but refused.

## 2015-05-12 NOTE — Telephone Encounter (Signed)
appt made

## 2015-05-12 NOTE — Telephone Encounter (Signed)
Would recommend that she needs to be seen today either at office or in ER.

## 2015-05-14 ENCOUNTER — Ambulatory Visit (INDEPENDENT_AMBULATORY_CARE_PROVIDER_SITE_OTHER): Payer: Medicare Other | Admitting: Family

## 2015-05-14 ENCOUNTER — Encounter: Payer: Self-pay | Admitting: Family

## 2015-05-14 ENCOUNTER — Other Ambulatory Visit (INDEPENDENT_AMBULATORY_CARE_PROVIDER_SITE_OTHER): Payer: Medicare Other

## 2015-05-14 VITALS — BP 140/64 | HR 68 | Temp 98.0°F | Resp 16 | Ht 62.0 in | Wt 193.1 lb

## 2015-05-14 DIAGNOSIS — R42 Dizziness and giddiness: Secondary | ICD-10-CM

## 2015-05-14 DIAGNOSIS — R0789 Other chest pain: Secondary | ICD-10-CM | POA: Diagnosis not present

## 2015-05-14 DIAGNOSIS — I5032 Chronic diastolic (congestive) heart failure: Secondary | ICD-10-CM | POA: Diagnosis not present

## 2015-05-14 DIAGNOSIS — I6523 Occlusion and stenosis of bilateral carotid arteries: Secondary | ICD-10-CM | POA: Diagnosis not present

## 2015-05-14 LAB — BASIC METABOLIC PANEL
BUN: 19 mg/dL (ref 6–23)
CO2: 31 mEq/L (ref 19–32)
Calcium: 10 mg/dL (ref 8.4–10.5)
Chloride: 99 mEq/L (ref 96–112)
Creatinine, Ser: 1.05 mg/dL (ref 0.40–1.20)
GFR: 55.12 mL/min — ABNORMAL LOW (ref >60.00)
Glucose, Bld: 92 mg/dL (ref 70–99)
Potassium: 3.8 mEq/L (ref 3.5–5.1)
Sodium: 139 mEq/L (ref 135–145)

## 2015-05-14 LAB — BRAIN NATRIURETIC PEPTIDE: Pro B Natriuretic peptide (BNP): 87 pg/mL (ref 0.0–100.0)

## 2015-05-14 NOTE — Patient Instructions (Signed)
Thank you for choosing [facility].  Summary/Instructions:  Please stop by the lab on the basement level of the building for your blood work. Your results will be released to MyChart (or called to you) after review, usually within 72 hours after test completion. If any changes need to be made, you will be notified at that same time.  If your symptoms worsen or fail to improve, please contact our office for further instruction, or in case of emergency go directly to the emergency room at the closest medical facility.   Please continue to take your medications as prescribed.   

## 2015-05-14 NOTE — Progress Notes (Signed)
Subjective:    Patient ID: Abigail Oliver, female    DOB: 08-Mar-1946, 69 y.o.   MRN: [id number]  Chief Complaint  Patient presents with  . Dizziness    has been having dizziness for 6 to 8 weeks, thinks she retaining fluid bc she having some trouble breathing normal, last time she had fluid around her heart and it feels like elephant is sitting on her chest    HPI:  Abigail Oliver is a 69 y.o. female who  has a past medical history of Coronary atherosclerosis of native coronary artery; Hyperlipidemia; Chronic angle-closure glaucoma(365.23); Aortic valve disorders; Peripheral vascular disease ([place]); Type II diabetes mellitus ([place]); Depression; PONV (postoperative nausea and vomiting); CHF (congestive heart failure) ([place]); GERD (gastroesophageal reflux disease); Neuromuscular disorder ([place]); Arthritis; Anemia; Syncope (03/29/2012); Dizziness (02/09/2012); OSA (obstructive sleep apnea); Abnormal stress test, 7/15, + ischemia (12/15/2013); and S/P arterial stent-to LAD, Promus DES 12/14/13 (12/15/2013). and presents today for an office visit.   This is a chronic problem. Associated symptom of dizziness have been going on for about 6-8 weeks. She believes that she is experiencing fluid retention and having some difficulty breathing and describes it as having an elephant sitting on her chest. Describes the dizziness as a "vibration" and possible lightheadedness that occurs daily and comes and goes. Does not believe it is positional as it can occur either while sitting or standing. Does not consume caffeine or alcohol. States that she is constantly drinking water. Denies symptoms of illness.   Allergies  Allergen Reactions  . Atorvastatin     Muscle pain  . Simvastatin Other (See Comments)    Muscle pain  . Sulfa Antibiotics   . Iodinated Diagnostic Agents Rash     Red rash after cardiac cath 1 wk ago, ? Contrast allergy, requires 13 hr prep now per [email or web address]     Current  Outpatient Prescriptions on File Prior to Visit  Medication Sig Dispense Refill  . Ascorbic Acid (VITAMIN C) 500 MG tablet Take 500 mg by mouth 2 (two) times daily.     Marland Kitchen aspirin EC 81 MG tablet Take 1 tablet (81 mg total) by mouth daily.    . calcium-vitamin D (CALCIUM 500+D) 500-200 MG-UNIT per tablet Take 1 tablet by mouth 2 (two) times daily.     . citalopram (CELEXA) 20 MG tablet Take 1 tablet (20 mg total) by mouth at bedtime. 30 tablet 5  . cyclobenzaprine (FLEXERIL) 5 MG tablet TAKE 1 TABLET (5 MG TOTAL) BY MOUTH 2 (TWO) TIMES DAILY AS NEEDED FORMUSCLE SPASMS. 60 tablet 1  . ferrous sulfate 325 (65 FE) MG tablet Take 1 tablet (325 mg total) by mouth daily. 30 tablet 0  . gabapentin (NEURONTIN) 300 MG capsule TAKE 1 CAPSULE (300 MG TOTAL) BY MOUTH 3 (THREE) TIMES DAILY. 90 capsule 1  . latanoprost (XALATAN) 0.005 % ophthalmic solution PLACE 1 DROP INTO BOTH EYES AT BEDTIME. 2.5 mL 2  . losartan (COZAAR) 25 MG tablet Take 1 tablet (25 mg total) by mouth daily. 90 tablet 3  . metFORMIN (GLUCOPHAGE) 1000 MG tablet TAKE 1 TABLET (1,000 MG TOTAL) BY MOUTH 2 (TWO) TIMES DAILY WITH A MEAL. 60 tablet 11  . metoprolol succinate (TOPROL-XL) 50 MG 24 hr tablet TAKE 1 TABLET (50 MG TOTAL) BY MOUTH DAILY. 30 tablet 11  . nitroGLYCERIN (NITROSTAT) 0.4 MG SL tablet Place 1 tablet (0.4 mg total) under the tongue every 5 (five) minutes as needed for chest pain. 25 tablet 3  .  pantoprazole (PROTONIX) 40 MG tablet Take 1 tablet (40 mg total) by mouth daily. 90 tablet 3  . potassium chloride SA (K-DUR,KLOR-CON) 20 MEQ tablet 2 tablets (40 mEq) by mouth two times a day 360 tablet 3  . rosuvastatin (CRESTOR) 20 MG tablet Take 1 tablet by mouth twice weekly. 90 tablet 0  . torsemide (DEMADEX) 20 MG tablet 3 tablets (60mg ) by mouth in the AM and 2 tablets (40mg ) by mouth in the PM 450 tablet 3  . traZODone (DESYREL) 50 MG tablet TAKE 0.5-1 TABLETS (25-50 MG TOTAL) BY MOUTH AT BEDTIME AS NEEDED FORSLEEP. 30 tablet 3   . warfarin (COUMADIN) 3 MG tablet Take 1-1.5 tablets by mouth daily as directed by coumadin clinic 40 tablet 2   No current facility-administered medications on file prior to visit.    Past Medical History  Diagnosis Date  . Coronary atherosclerosis of native coronary artery   . Hyperlipidemia   . Chronic angle-closure glaucoma(365.23)   . Aortic valve disorders   . Peripheral vascular disease ([place])   . Type II diabetes mellitus ([place])   . Depression   . PONV (postoperative nausea and vomiting)     N&V  . CHF (congestive heart failure) ([facility])     diastolic heart failure  . GERD (gastroesophageal reflux disease)   . Neuromuscular disorder ([facility])     neuropathy  . Arthritis     osteo  . Anemia   . Syncope 03/29/2012    carotid doppler - R ICA 50-69% reduction by velocities (low end); L ICA 0-49% reduction by velocities (low end); R and L subclavian arteries - <50% redcution; R and L vertebral arteries show normal antegrade flow  . Dizziness 02/09/2012    Holter monitor - sinus rhythm, no ventricular ectopic beats, no sulpraventricular ectopic beats noted  . OSA (obstructive sleep apnea)   . Abnormal stress test, 7/15, + ischemia 12/15/2013  . S/P arterial stent-to LAD, Promus DES 12/14/13 12/15/2013    Review of Systems  Constitutional: Negative for fever and chills.  Respiratory: Positive for shortness of breath. Negative for cough and chest tightness.   Cardiovascular: Positive for chest pain and leg swelling. Negative for palpitations.  Neurological: Positive for light-headedness. Negative for dizziness.      Objective:    BP 140/64 mmHg  Pulse 68  Temp(Src) 98 F (36.7 C) (Oral)  Resp 16  Ht 5\' 2"  (1.575 m)  Wt 193 lb 1.9 oz (87.599 kg)  BMI 35.31 kg/m2  SpO2 98% Nursing note and vital signs reviewed.  Physical Exam  Constitutional: She is oriented to person, place, and time. She appears well-developed and well-nourished. No distress.  Cardiovascular: Normal rate,  regular rhythm, normal heart sounds and intact distal pulses.   Mild nonpitting edema noted bilaterally. Pulses are 1+.  Pulmonary/Chest: Effort normal and breath sounds normal. No respiratory distress. She has no wheezes. She has no rales. She exhibits no tenderness.  Neurological: She is alert and oriented to person, place, and time.  Skin: Skin is warm and dry.  Psychiatric: She has a normal mood and affect. Her behavior is normal. Judgment and thought content normal.       Assessment & Plan:   Problem List Items Addressed This Visit      Cardiovascular and Mediastinum   Chronic diastolic heart failure (H[facility])   Relevant Orders   B Nat Peptide     Other   Dizziness - Primary    Symptoms of dizziness of questionable origin  with concern for heart failure as a potential cause. Symptoms described are more consistent with lightheadedness as opposed to vertigo. Obtain basic metabolic panel and B natruretic peptide. Encouraged to change position slowly. In office EKG shows normal sinus rhythm, with potential artifact. Follow-up pending lab work or if symptoms worsen or fail to improve.      Relevant Orders   Basic metabolic panel    Other Visit Diagnoses    Chest discomfort        Relevant Orders    EKG 12-Lead (Completed)

## 2015-05-14 NOTE — Assessment & Plan Note (Signed)
Symptoms of dizziness of questionable origin with concern for heart failure as a potential cause. Symptoms described are more consistent with lightheadedness as opposed to vertigo. Obtain basic metabolic panel and B natruretic peptide. Encouraged to change position slowly. In office EKG shows normal sinus rhythm, with potential artifact. Follow-up pending lab work or if symptoms worsen or fail to improve.

## 2015-05-15 ENCOUNTER — Telehealth: Payer: Self-pay | Admitting: Internal Medicine

## 2015-05-15 NOTE — Telephone Encounter (Signed)
Please advise 

## 2015-05-15 NOTE — Telephone Encounter (Signed)
LVM for pt to call back.

## 2015-05-15 NOTE — Telephone Encounter (Signed)
Patient states she seen on My Chart that her labs were normal.  She would like a call back.  She would like to know if Marya Amsler thought any of her medications might be causing her to get dizzy.

## 2015-05-15 NOTE — Telephone Encounter (Signed)
It is a possibility the meds can be causing her symptoms although unlikely. My concern would be for her to follow up with cardiology to ensure there is nothing worsening from a cardiac perspective as the labs were all normal.

## 2015-05-27 ENCOUNTER — Telehealth: Payer: Self-pay | Admitting: *Deleted

## 2015-05-27 ENCOUNTER — Other Ambulatory Visit: Payer: Self-pay | Admitting: Geriatric Medicine

## 2015-05-27 MED ORDER — CITALOPRAM HYDROBROMIDE 20 MG PO TABS: 20.0000 mg | ORAL_TABLET | Freq: Every day | ORAL | Status: DC

## 2015-05-27 NOTE — Telephone Encounter (Signed)
Left msg on triage Friday afternoon @ 4:53 pm stating pt is needing refill on her Citalopram. Sent refill electronically....Johny Chess

## 2015-06-10 ENCOUNTER — Other Ambulatory Visit: Payer: Self-pay | Admitting: Cardiology

## 2015-06-11 ENCOUNTER — Other Ambulatory Visit: Payer: Self-pay | Admitting: Internal Medicine

## 2015-06-11 ENCOUNTER — Other Ambulatory Visit: Payer: Self-pay | Admitting: Cardiovascular Disease

## 2015-06-16 ENCOUNTER — Telehealth: Payer: Self-pay | Admitting: Cardiology

## 2015-06-16 NOTE — Telephone Encounter (Signed)
Pt advised Dr [name] was going to schedule follow for her with him in the [facility], pt advised I will forward message to [facility] to help with an appt for her in Heart Failure.

## 2015-06-16 NOTE — Telephone Encounter (Signed)
Pt advised, verbalized understanding, agreed with plan.  

## 2015-06-16 NOTE — Telephone Encounter (Signed)
Stay off losartan for now but concerned because BP now running high.  If she has recurrent lightheaded event will need event monitor.  Have her continue to monitor BP daily and call in readings in 2 wks.  Needs followup soon with me or PA.

## 2015-06-16 NOTE — Telephone Encounter (Signed)
New message      Pt c/o medication issue:  1. Name of Medication: losartan 2. How are you currently taking this medication (dosage and times per day)? 25mg  3. Are you having a reaction (difficulty breathing--STAT)? no 4. What is your medication issue? Pt fell 2 weeks ago.  Prior to the fall, she was very dizzy.  Pt thinks it was the losartan.  Pt stopped taking losartan 2 weeks ago.  Her dizziness is better but not gone.  Should she stay off the medication?  Will the doctor want to put her on something else for bp?  Please advise

## 2015-06-16 NOTE — Telephone Encounter (Signed)
Pt states she has been dizzy progressively for the last 3 months or so.   Pt states she stopped losartan about 2 weeks ago after talking to her pharmacist. Pt states her dizziness improved after stopping losartan.  Pt states she had episode of dizziness yesterday while drying off after a hot shower, she felt weak, and went to bathroom floor. Pt denies injury from yesterday except bruise on her knee.  Pt states she does not feel dizziness yesterday was related to medication, she feels it was related to hot shower.  Pt states BP readings the last week averaging 160/60 heart rate 72. Pt advised I will forward to Dr [name] for review.

## 2015-06-23 ENCOUNTER — Ambulatory Visit (HOSPITAL_COMMUNITY)
Admission: RE | Admit: 2015-06-23 | Discharge: 2015-06-23 | Disposition: A | Discharge status: Home / Self Care | Payer: Medicare Other | Source: Ambulatory Visit | Attending: Cardiology | Admitting: Cardiology

## 2015-06-23 ENCOUNTER — Encounter (HOSPITAL_COMMUNITY): Payer: Self-pay

## 2015-06-23 VITALS — BP 116/60 | HR 67 | Wt 188.8 lb

## 2015-06-23 DIAGNOSIS — Z959 Presence of cardiac and vascular implant and graft, unspecified: Secondary | ICD-10-CM

## 2015-06-23 DIAGNOSIS — I6523 Occlusion and stenosis of bilateral carotid arteries: Secondary | ICD-10-CM | POA: Insufficient documentation

## 2015-06-23 DIAGNOSIS — Z952 Presence of prosthetic heart valve: Secondary | ICD-10-CM | POA: Insufficient documentation

## 2015-06-23 DIAGNOSIS — E785 Hyperlipidemia, unspecified: Secondary | ICD-10-CM | POA: Diagnosis not present

## 2015-06-23 DIAGNOSIS — Z7982 Long term (current) use of aspirin: Secondary | ICD-10-CM | POA: Insufficient documentation

## 2015-06-23 DIAGNOSIS — I5032 Chronic diastolic (congestive) heart failure: Secondary | ICD-10-CM | POA: Insufficient documentation

## 2015-06-23 DIAGNOSIS — R42 Dizziness and giddiness: Secondary | ICD-10-CM | POA: Insufficient documentation

## 2015-06-23 DIAGNOSIS — Z7984 Long term (current) use of oral hypoglycemic drugs: Secondary | ICD-10-CM | POA: Insufficient documentation

## 2015-06-23 DIAGNOSIS — Z91041 Radiographic dye allergy status: Secondary | ICD-10-CM | POA: Diagnosis not present

## 2015-06-23 DIAGNOSIS — I739 Peripheral vascular disease, unspecified: Secondary | ICD-10-CM | POA: Insufficient documentation

## 2015-06-23 DIAGNOSIS — Z79899 Other long term (current) drug therapy: Secondary | ICD-10-CM | POA: Diagnosis not present

## 2015-06-23 DIAGNOSIS — G4733 Obstructive sleep apnea (adult) (pediatric): Secondary | ICD-10-CM | POA: Insufficient documentation

## 2015-06-23 DIAGNOSIS — I110 Hypertensive heart disease with heart failure: Secondary | ICD-10-CM | POA: Insufficient documentation

## 2015-06-23 DIAGNOSIS — K219 Gastro-esophageal reflux disease without esophagitis: Secondary | ICD-10-CM | POA: Insufficient documentation

## 2015-06-23 DIAGNOSIS — E119 Type 2 diabetes mellitus without complications: Secondary | ICD-10-CM | POA: Insufficient documentation

## 2015-06-23 DIAGNOSIS — I2510 Atherosclerotic heart disease of native coronary artery without angina pectoris: Secondary | ICD-10-CM | POA: Diagnosis not present

## 2015-06-23 DIAGNOSIS — Z951 Presence of aortocoronary bypass graft: Secondary | ICD-10-CM | POA: Diagnosis not present

## 2015-06-23 DIAGNOSIS — Z7901 Long term (current) use of anticoagulants: Secondary | ICD-10-CM | POA: Insufficient documentation

## 2015-06-23 LAB — BRAIN NATRIURETIC PEPTIDE: B Natriuretic Peptide: 114.4 pg/mL — ABNORMAL HIGH (ref 0.0–100.0)

## 2015-06-23 LAB — CBC
HCT: 33.9 % — ABNORMAL LOW (ref 36.0–46.0)
Hemoglobin: 10.8 g/dL — ABNORMAL LOW (ref 12.0–15.0)
MCH: 28.3 pg (ref 26.0–34.0)
MCHC: 31.9 g/dL (ref 30.0–36.0)
MCV: 88.7 fL (ref 78.0–100.0)
Platelets: 166 10*3/uL (ref 150–400)
RBC: 3.82 MIL/uL — ABNORMAL LOW (ref 3.87–5.11)
RDW: 15.6 % — ABNORMAL HIGH (ref 11.5–15.5)
WBC: 6.1 10*3/uL (ref 4.0–10.5)

## 2015-06-23 LAB — BASIC METABOLIC PANEL
Anion gap: 15 (ref 5–15)
BUN: 12 mg/dL (ref 6–20)
CO2: 26 mmol/L (ref 22–32)
Calcium: 9.6 mg/dL (ref 8.9–10.3)
Chloride: 102 mmol/L (ref 101–111)
Creatinine, Ser: 1.05 mg/dL — ABNORMAL HIGH (ref 0.44–1.00)
GFR calc Af Amer: >60 mL/min (ref >60)
GFR calc non Af Amer: 53 mL/min — ABNORMAL LOW (ref >60)
Glucose, Bld: 114 mg/dL — ABNORMAL HIGH (ref 65–99)
Potassium: 4.2 mmol/L (ref 3.5–5.1)
Sodium: 143 mmol/L (ref 135–145)

## 2015-06-23 NOTE — Patient Instructions (Signed)
Labs today  Your physician has recommended that you wear an event monitor. Event monitors are medical devices that record the heart's electrical activity. Doctors most often [place] these monitors to diagnose arrhythmias. Arrhythmias are problems with the speed or rhythm of the heartbeat. The monitor is a small, portable device. You can wear one while you do your normal daily activities. This is usually used to diagnose what is causing palpitations/syncope (passing out).  You have been referred to [facility], they will call you to schedule  Your physician recommends that you schedule a follow-up appointment in: 1 month

## 2015-06-24 ENCOUNTER — Telehealth (HOSPITAL_COMMUNITY): Payer: Self-pay | Admitting: *Deleted

## 2015-06-24 NOTE — Telephone Encounter (Signed)
Referral faxed to [facility]. Fax: [phone]

## 2015-06-24 NOTE — Progress Notes (Signed)
Patient ID: Abigail Oliver, female   DOB: 11-Feb-1946, 70 y.o.   MRN: [id number] PCP: Dr. Sharlet Salina  70 yo with history of aortic stenosis s/p St Jude mechanical AVR (6/00) and CAD s/p single vessel CABG (6/00) presents for cardiology evaluation.  She had severe aortic stenosis and therefore required CABG-AVR in 6/00.  She had a RIMA-RCA. She had a myoview in 4/13 with no ischemia or infarction, and echo in 6/14 showed normal EF with moderate diastolic dysfunction and a well-seated mechanical valve.    In 6/15, torsemide was stopped and she was begun on Lasix due to increased cost of torsemide.  She developed dyspnea and lower extremity edema.  She was admitted later that month with community-acquired PNA as well as CHF.  CT showed RLL PNA and multiple pulmonary nodules.  Echo in 6/15 showed EF 60-65%, mechanical aortic valve with mean gradient 27 mmHg. After discharge, she continued to be short of breath.  Lasix was increased to 80 mg bid.  This did not help much.  She was short of breath walking around in her yard.  No chest pain.  Lexiscan Cardiolite was done in 7/15 given the exertional symptoms and showed a small area of basal inferolateral ischemia.   Given ongoing exertional symptoms, I did a right and left heart catheterization in 7/15.  Right and left heart filling pressures were normal.  There was an 80% stenosis in the proximal LAD that was hemodynamically significant by FFR. Patient had DES to proximal LAD.    She has had left-sided arm and also leg weakness for a number of months. MRI head showed no CVA.  She was seen by neurology and c-spine arthritis was suspected for the arm weakness, but she never had the c-spine MRI done.    Main complaint recently has been "dizzy spells."  These can happen while standing, seated, or lying down. Sometimes they seem to be triggered by movements in head position but not always.  Seems like more of a spinning sensation than lightheadedness.  She was not  orthostatic today when I checked.  Losartan was stopped, and she feels like this helped but still getting dizzy several times on most days.  In the past, she was told that she had labyrinthitis. She takes home BP readings, and BP is never low.  She remains short of breath walking 100-150 feet.  No chest pain.  No orthopnea/PND. No melena/BRBPR.   Labs (6/14): BNP 216, HCT 32, K 4.8, creatinine 0.8 Labs (8/14): K 4.4, creatinine 0.8, BNP 55, HDL 45, LDL 141 Labs (10/14): LDL 61, HDL 43, AST 40, ALT 31 Labs (6/15): BNP 37 Labs (7/15): K 4.4, creatinine 0.8 => 0.77 Labs (9/15): HCT 34.5, LDL 32, HDL 40 Labs (4/16): K 3.8, creatinine 0.96 Labs (5/16): K 4.4, creatinine 1.02, LDL 138 Labs (6/16): K 4.3, creatinine 1.34, BNP 83 Labs (12/16): K 3.8, creatinine 1.05, BNP 87  PMH: 1. Aortic stenosis: Now has St Jude mechanical aortic valve (6/00).  Echo (6/14) with EF 60-65%, mild LVH, moderate diastolic dysfunction, normal RV, mechanical aortic valve with mean gradient 20 mmHg.  Echo (6/15) with EF 60-65%, mechanical aortic valve with mean gradient 27 mmHg.  2. CAD: CABG at time of AVR in 6/00 with RIMA-RCA.  Myoview in 4/13 with EF 72%, no ischemia or infarction. Lexiscan Cardiolite (7/15) with EF 69%, small area of basal inferolateral ischemia.  LHC (7/15) with patent RIMA-RCA, 80% mRCA, 80% pLAD with FFR 0.73, treated with DES to  pLAD.  3. HTN 4. Type II diabetes 5. GERD 6. OSA 7. Hyperlipidemia 8. PAD: Right SFA stent. Peripheral arterial dopplers (7/15) with patent right SFA stent. Peripheral arterial dopplers (7/16) with right SFA stent patent.  9. Colon polyps 10. Carotid stenosis: Carotid dopplers (11/13) witih 50-69% RICA.  Carotid dopplers (7/15) with 40-59% RICA. Carotid dopplers (7/16) with 40-59% BICA stenosis.  11. Diastolic CHF 12. Gastritis 13. Dizziness: Holter (8/14) with few PACs, otherwise unremarkable.  14. Mild transaminase elevation 15. Pulmonary nodules: Noted by CT in  6/15.  CT chest (8/15) was thought to indicate that nodules were benign.  No further workup was recommended.  16. Contrast allergy 17. Diastolic CHF: RHC (123456) with mean RA 2, PA 22/11, mean PCWP 9, CI 3.79 18. Low back pain. 19. Possible c-spine arthritis with pain down left arm.    SH: Married, works as Theme park manager, nonsmoker.   FH: CAD  ROS: All systems reviewed and negative except as per HPI.   Current Outpatient Prescriptions  Medication Sig Dispense Refill  . Ascorbic Acid (VITAMIN C) 500 MG tablet Take 500 mg by mouth 2 (two) times daily.     Marland Kitchen aspirin EC 81 MG tablet Take 1 tablet (81 mg total) by mouth daily.    . calcium-vitamin D (CALCIUM 500+D) 500-200 MG-UNIT per tablet Take 1 tablet by mouth 2 (two) times daily.     . citalopram (CELEXA) 20 MG tablet Take 1 tablet (20 mg total) by mouth at bedtime. 30 tablet 5  . cyclobenzaprine (FLEXERIL) 5 MG tablet TAKE 1 TABLET (5 MG TOTAL) BY MOUTH 2 (TWO) TIMES DAILY AS NEEDED FORMUSCLE SPASMS. 60 tablet 1  . ferrous sulfate 325 (65 FE) MG tablet Take 1 tablet (325 mg total) by mouth daily. 30 tablet 0  . gabapentin (NEURONTIN) 300 MG capsule TAKE 1 CAPSULE (300 MG TOTAL) BY MOUTH 3 (THREE) TIMES DAILY. 90 capsule 0  . latanoprost (XALATAN) 0.005 % ophthalmic solution PLACE 1 DROP INTO BOTH EYES AT BEDTIME. 2.5 mL 2  . metFORMIN (GLUCOPHAGE) 1000 MG tablet TAKE 1 TABLET (1,000 MG TOTAL) BY MOUTH 2 (TWO) TIMES DAILY WITH A MEAL. 60 tablet 11  . metoprolol succinate (TOPROL-XL) 50 MG 24 hr tablet TAKE 1 TABLET (50 MG TOTAL) BY MOUTH DAILY. 30 tablet 11  . nitroGLYCERIN (NITROSTAT) 0.4 MG SL tablet Place 1 tablet (0.4 mg total) under the tongue every 5 (five) minutes as needed for chest pain. 25 tablet 3  . pantoprazole (PROTONIX) 40 MG tablet Take 1 tablet (40 mg total) by mouth daily. 90 tablet 3  . potassium chloride SA (K-DUR,KLOR-CON) 20 MEQ tablet 2 tablets (40 mEq) by mouth two times a day 360 tablet 3  . rosuvastatin (CRESTOR) 20  MG tablet Take 1 tablet by mouth twice weekly. 90 tablet 0  . torsemide (DEMADEX) 20 MG tablet 3 tablets (60mg ) by mouth in the AM and 2 tablets (40mg ) by mouth in the PM 450 tablet 3  . traZODone (DESYREL) 50 MG tablet TAKE 0.5-1 TABLETS (25-50 MG TOTAL) BY MOUTH AT BEDTIME AS NEEDED FORSLEEP. 30 tablet 3  . warfarin (COUMADIN) 3 MG tablet TAKE 1-1.5 TABLETS BY MOUTH DAILY AS DIRECTED BY COUMADIN CLINIC 40 tablet 1   No current facility-administered medications for this encounter.    BP 116/60 mmHg  Pulse 67  Wt 188 lb 12 oz (85.616 kg)  SpO2 98% General: NAD Neck: JVP 7 cm, no thyromegaly or thyroid nodule.  Lungs: Clear to auscultation bilaterally with  normal respiratory effort. CV: Nondisplaced PMI.  Heart regular S1/S2, mechanical S2, no S3/S4, 1/6 early SEM RUSB.  No ankle edema.  No carotid bruit.  Normal pedal pulses.  Abdomen: Soft, nontender, no hepatosplenomegaly, mild distention.  Neurologic: Alert and oriented x 3.  Psych: Normal affect. Extremities: No clubbing or cyanosis.    Assessment/Plan: 1. St Jude mechanical aortic valve:  Mildly elevated mean gradient for this valve on echo in 6/15.  Continue coumadin and ASA 81 daily.   - CBC today.  2. Chronic diastolic CHF: NYHA class II-III symptoms, stable.  She is not volume overloaded on exam.  - Check BMET/BNP today.   - Continue current torsemide and KCl.  3. CAD: s/p RIMA-RCA at time of AVR.  She developed exertional dyspnea and had cath showing significant proximal LAD stenosis in 7/15 that was treated with BMS.   - She is now off Plavix.  Given mechanical valve, She is on regimen of ASA 81 daily + warfarin.  - Continue statin.   4. Hyperlipidemia: Myalgias with atorvastatin and simvastatin.  She will need lipids at next appointment on Crestor 20 mg biw.  5. Carotid stenosis: Repeat 7/17.  6. PAD: Known PAD with right SFA stent.  Last peripheral arterial dopplers showed patent R SFA stent.   7. "Dizziness:"  This can  occur sitting, standing, or lying.  At least somewhat related to head position.  She is not orthostatic today.  In the past, had labyrinthitis that felt similarly.  I think that her symptoms are related to vertigo.   - I will try to arrange vestibular rehab.  - 2 week event monitor to rule out arrhythmia (but doubt).    Followup in 1 month.    Loralie Champagne 06/24/2015

## 2015-06-25 ENCOUNTER — Ambulatory Visit (INDEPENDENT_AMBULATORY_CARE_PROVIDER_SITE_OTHER): Payer: Medicare Other

## 2015-06-25 DIAGNOSIS — R42 Dizziness and giddiness: Secondary | ICD-10-CM

## 2015-06-26 DIAGNOSIS — R42 Dizziness and giddiness: Secondary | ICD-10-CM | POA: Diagnosis not present

## 2015-07-02 ENCOUNTER — Telehealth: Payer: Self-pay | Admitting: Internal Medicine

## 2015-07-02 NOTE — Telephone Encounter (Signed)
Left message for patient to call back. Discussed with kim in HIM Pt is requesting to have a form filled out for a procedure she had done recently.   She reports that she has dropped the form off in the past and paid $25 and the pmt and the form were returned to her not completed.  The only form seen in [employer] is from Feb 2016 for dates May -June 2015 for CHF.  Pt was not seen in office during that time or hospitalized.

## 2015-07-02 NOTE — Telephone Encounter (Signed)
New Message  Pt returned call//shanti

## 2015-07-02 NOTE — Telephone Encounter (Signed)
Patient called back. The dates are July 2015 when she had a cath and stent by Dr. Angelena Form. I discussed again with Maudie Mercury in HIM; and then advised patient to bring a copy of the papers that were returned to her not completed, as well as a new form and check for $25. Pt is in agreement and appreciative for the assistance.  Also pt is wearing a monitor and states the company called her about an alarm, when she didn't answer they sent her a message via the monitor to call them.  She did and doesn't know what they really wanted.  I advised her that there are no notes in her chart regarding any abnormal rhythms from the monitor company.

## 2015-07-07 ENCOUNTER — Observation Stay (HOSPITAL_COMMUNITY)
Admission: EM | Admit: 2015-07-07 | Discharge: 2015-07-07 | Disposition: A | Discharge status: Home / Self Care | Payer: Medicare Other | Attending: Emergency Medicine | Admitting: Emergency Medicine

## 2015-07-07 ENCOUNTER — Emergency Department (HOSPITAL_COMMUNITY): Payer: Medicare Other

## 2015-07-07 ENCOUNTER — Encounter (HOSPITAL_COMMUNITY): Payer: Self-pay | Admitting: *Deleted

## 2015-07-07 DIAGNOSIS — E785 Hyperlipidemia, unspecified: Secondary | ICD-10-CM | POA: Insufficient documentation

## 2015-07-07 DIAGNOSIS — Z955 Presence of coronary angioplasty implant and graft: Secondary | ICD-10-CM | POA: Insufficient documentation

## 2015-07-07 DIAGNOSIS — J069 Acute upper respiratory infection, unspecified: Principal | ICD-10-CM | POA: Insufficient documentation

## 2015-07-07 DIAGNOSIS — R05 Cough: Secondary | ICD-10-CM | POA: Diagnosis not present

## 2015-07-07 DIAGNOSIS — I2510 Atherosclerotic heart disease of native coronary artery without angina pectoris: Secondary | ICD-10-CM | POA: Diagnosis present

## 2015-07-07 DIAGNOSIS — I4581 Long QT syndrome: Secondary | ICD-10-CM | POA: Diagnosis not present

## 2015-07-07 DIAGNOSIS — R0789 Other chest pain: Secondary | ICD-10-CM | POA: Diagnosis not present

## 2015-07-07 DIAGNOSIS — Z9049 Acquired absence of other specified parts of digestive tract: Secondary | ICD-10-CM | POA: Insufficient documentation

## 2015-07-07 DIAGNOSIS — R0600 Dyspnea, unspecified: Secondary | ICD-10-CM

## 2015-07-07 DIAGNOSIS — Z952 Presence of prosthetic heart valve: Secondary | ICD-10-CM | POA: Insufficient documentation

## 2015-07-07 DIAGNOSIS — I6523 Occlusion and stenosis of bilateral carotid arteries: Secondary | ICD-10-CM | POA: Insufficient documentation

## 2015-07-07 DIAGNOSIS — G4733 Obstructive sleep apnea (adult) (pediatric): Secondary | ICD-10-CM | POA: Insufficient documentation

## 2015-07-07 DIAGNOSIS — I209 Angina pectoris, unspecified: Secondary | ICD-10-CM | POA: Diagnosis not present

## 2015-07-07 DIAGNOSIS — Z951 Presence of aortocoronary bypass graft: Secondary | ICD-10-CM | POA: Diagnosis not present

## 2015-07-07 DIAGNOSIS — R079 Chest pain, unspecified: Secondary | ICD-10-CM

## 2015-07-07 DIAGNOSIS — Z79899 Other long term (current) drug therapy: Secondary | ICD-10-CM | POA: Diagnosis not present

## 2015-07-07 DIAGNOSIS — K219 Gastro-esophageal reflux disease without esophagitis: Secondary | ICD-10-CM | POA: Diagnosis not present

## 2015-07-07 DIAGNOSIS — I5032 Chronic diastolic (congestive) heart failure: Secondary | ICD-10-CM | POA: Diagnosis not present

## 2015-07-07 DIAGNOSIS — R0602 Shortness of breath: Secondary | ICD-10-CM | POA: Diagnosis not present

## 2015-07-07 DIAGNOSIS — Z7984 Long term (current) use of oral hypoglycemic drugs: Secondary | ICD-10-CM | POA: Diagnosis not present

## 2015-07-07 DIAGNOSIS — R0609 Other forms of dyspnea: Secondary | ICD-10-CM

## 2015-07-07 DIAGNOSIS — E1151 Type 2 diabetes mellitus with diabetic peripheral angiopathy without gangrene: Secondary | ICD-10-CM | POA: Insufficient documentation

## 2015-07-07 DIAGNOSIS — I25118 Atherosclerotic heart disease of native coronary artery with other forms of angina pectoris: Secondary | ICD-10-CM

## 2015-07-07 DIAGNOSIS — E1140 Type 2 diabetes mellitus with diabetic neuropathy, unspecified: Secondary | ICD-10-CM | POA: Insufficient documentation

## 2015-07-07 DIAGNOSIS — Z7982 Long term (current) use of aspirin: Secondary | ICD-10-CM | POA: Diagnosis not present

## 2015-07-07 DIAGNOSIS — E1143 Type 2 diabetes mellitus with diabetic autonomic (poly)neuropathy: Secondary | ICD-10-CM | POA: Diagnosis present

## 2015-07-07 DIAGNOSIS — I110 Hypertensive heart disease with heart failure: Secondary | ICD-10-CM | POA: Insufficient documentation

## 2015-07-07 DIAGNOSIS — I739 Peripheral vascular disease, unspecified: Secondary | ICD-10-CM | POA: Diagnosis not present

## 2015-07-07 DIAGNOSIS — D649 Anemia, unspecified: Secondary | ICD-10-CM | POA: Insufficient documentation

## 2015-07-07 DIAGNOSIS — Z7901 Long term (current) use of anticoagulants: Secondary | ICD-10-CM | POA: Diagnosis not present

## 2015-07-07 HISTORY — DX: Chronic diastolic (congestive) heart failure: I50.32

## 2015-07-07 HISTORY — DX: Essential (primary) hypertension: I10

## 2015-07-07 HISTORY — DX: Low back pain, unspecified: M54.50

## 2015-07-07 HISTORY — DX: Radiographic dye allergy status: Z91.041

## 2015-07-07 HISTORY — DX: Peripheral vascular disease, unspecified: I73.9

## 2015-07-07 HISTORY — DX: Polyp of colon: K63.5

## 2015-07-07 HISTORY — DX: Other nonspecific abnormal finding of lung field: R91.8

## 2015-07-07 HISTORY — DX: Low back pain: M54.5

## 2015-07-07 HISTORY — DX: Elevation of levels of liver transaminase levels: R74.01

## 2015-07-07 HISTORY — DX: Disorder of arteries and arterioles, unspecified: I77.9

## 2015-07-07 HISTORY — DX: Gastritis, unspecified, without bleeding: K29.70

## 2015-07-07 HISTORY — DX: Nonrheumatic aortic (valve) stenosis: I35.0

## 2015-07-07 HISTORY — DX: Nonspecific elevation of levels of transaminase and lactic acid dehydrogenase (ldh): R74.0

## 2015-07-07 LAB — TROPONIN I
Troponin I: <0.03 ng/mL (ref <0.031)
Troponin I: <0.03 ng/mL (ref <0.031)

## 2015-07-07 LAB — CBC
HCT: 34.9 % — ABNORMAL LOW (ref 36.0–46.0)
Hemoglobin: 10.8 g/dL — ABNORMAL LOW (ref 12.0–15.0)
MCH: 27.7 pg (ref 26.0–34.0)
MCHC: 30.9 g/dL (ref 30.0–36.0)
MCV: 89.5 fL (ref 78.0–100.0)
Platelets: 148 10*3/uL — ABNORMAL LOW (ref 150–400)
RBC: 3.9 MIL/uL (ref 3.87–5.11)
RDW: 16.2 % — ABNORMAL HIGH (ref 11.5–15.5)
WBC: 4.4 10*3/uL (ref 4.0–10.5)

## 2015-07-07 LAB — BRAIN NATRIURETIC PEPTIDE: B Natriuretic Peptide: 35.3 pg/mL (ref 0.0–100.0)

## 2015-07-07 LAB — BASIC METABOLIC PANEL
Anion gap: 12 (ref 5–15)
BUN: 28 mg/dL — ABNORMAL HIGH (ref 6–20)
CO2: 27 mmol/L (ref 22–32)
Calcium: 9.8 mg/dL (ref 8.9–10.3)
Chloride: 101 mmol/L (ref 101–111)
Creatinine, Ser: 1.18 mg/dL — ABNORMAL HIGH (ref 0.44–1.00)
GFR calc Af Amer: 53 mL/min — ABNORMAL LOW (ref >60)
GFR calc non Af Amer: 46 mL/min — ABNORMAL LOW (ref >60)
Glucose, Bld: 137 mg/dL — ABNORMAL HIGH (ref 65–99)
Potassium: 4.6 mmol/L (ref 3.5–5.1)
Sodium: 140 mmol/L (ref 135–145)

## 2015-07-07 MED ORDER — ASPIRIN 81 MG PO CHEW
324.0000 mg | CHEWABLE_TABLET | Freq: Once | ORAL | Status: AC
  Administered 2015-07-07: 324 mg via ORAL
  Filled 2015-07-07: qty 4

## 2015-07-07 NOTE — ED Notes (Signed)
Pt reports L chest pain since Thursday with dizziness and lightheadedness.  Pt describes pain as "an elephant sitting on my chest."  Per pt's son, pt has had cp and was seen by her cardiologist.  Was told that she has a leakage from her mitral valve which is not new.  Had an ECHO done and was insignificant per pt.  Pt's son reports pt is a poor historian.

## 2015-07-07 NOTE — ED Notes (Signed)
Report received from [place], [place]

## 2015-07-07 NOTE — Consult Note (Signed)
Cardiology Consultation Note    Patient ID: Abigail Oliver, MRN: [id number], DOB/AGE: April 04, 1946 70 y.o. Admit date: 07/07/2015   Date of Consult: 07/07/2015 Primary Physician: Hoyt Koch, MD Primary Cardiologist: [name]  Chief Complaint: cough Reason for Consultation: chest pressure Requesting MD: Dr. Reynolds Bowl  HPI: Abigail Oliver is a 70 y/o F with history of AS s/p St. Jude mechanical AVR 2000, CAD (s/p CABG with RIMA-RCA at time of AVR in 2000, s/p DES to LAD in 11/2013), HTN, DM, HLD, PAD s/p LE stenting, moderate carotid disease (40-59% BICA in 11/2014), chronic diastolic CHF, h/o dizziness (with PACs on Holter in 2014), contrast allergy, anemia who presented to [facility] with complaints of cough, recent fever, and chest discomfort. She was recently seen by Dr. [name] 06/23/15 at which time she was felt to be euvolemic at 188lb. She has chronic dyspnea. She was reporting dizziness which could happen in any position but seemed to be triggered by head position, more like a spinning sensation. Her symptoms were felt most c/w vertigo and event monitor is in progress (no alerts called to office yet) along with vestibular rehab referral.  She presented to [facility] today for evaluation of upper respiratory symptoms. She is somewhat of a difficult historian at times. She presented with symptoms of persistent cough and malaise with sensation of "elephant sitting on my chest." This sensation of chest pressure has been chronic on/off for several years along with exertional fatigue. She recalls that symptoms are typically worse when she is on her way to work, but once she is there, they tend to subside. She does not recall ever getting relief of this after PCI. She says it is present in an "undertone" nearly all the time - worse with recent URI symptoms. Over the past 5 days she has developed cough, sinus drainage, sneezing, and fever up to 101.5 which has exacerbated the chest pressure sensation. She  tried to get in to see her PCP for these symptoms but was advised there were no available appts so she came to the ER instead. She sleeps up chronically, has chronic DOE, and has chronic edema which is not present on exam today. Labs notable for normal BNP, normal troponin x 1, Hgb 10.8 (c/w prior), BUN/Cr 28/1.18 (baseline Cr ~1.05). CXR showed no evidence of active CP disease. Last 2D echo 10/2013: mild LVH, EF 60-65%, no RWMA, mild AS, mild MR, mildly dilated LA. She is wheezing on exam which she says is new. She denies h/o tobacco abuse but reports significant secondhand exposure from spouse. She says her weight fluctuates, most recently 183 at home. She received ASA 324mg  in the ER. VSS. Not tachycardic, tachypneic or hypoxic.   Past Medical History  Diagnosis Date  . Coronary atherosclerosis of native coronary artery     a. CABG at time of AVR in 6/00 with RIMA-RCA.b. Abnl nuc 11/2013 -> LHC (7/15) with patent RIMA-RCA, 80% mRCA, 80% pLAD with FFR 0.73, treated with DES to pLAD.  Marland Kitchen Hyperlipidemia   . Chronic angle-closure glaucoma(365.23)   . Aortic stenosis     a. Now has St Jude mechanical aortic valve (6/00).b. Echo (6/15) with EF 60-65%, mechanical aortic valve with mean gradient 27 mmHg.  Marland Kitchen Peripheral vascular disease ([place])     a, H/o Right SFA stent. b. Peripheral arterial dopplers (7/16) with right SFA stent patent.   . Type II diabetes mellitus ([place])   . Depression   . PONV (postoperative nausea and vomiting)  N&V  . Chronic diastolic CHF (congestive heart failure) ([place])     a.  RHC (7/15) with mean RA 2, PA 22/11, mean PCWP 9, CI 3.79.  Marland Kitchen GERD (gastroesophageal reflux disease)   . Neuromuscular disorder (HCC)     neuropathy  . Arthritis     a. Possible c-spine arthritis with pain down left arm.   . Anemia   . Syncope 03/29/2012    carotid doppler - R ICA 50-69% reduction by velocities (low end); L ICA 0-49% reduction by velocities (low end); R and L subclavian arteries -  <50% redcution; R and L vertebral arteries show normal antegrade flow  . Dizziness 02/09/2012    a. Holter (8/14) with few PACs, otherwise unremarkable.   . OSA (obstructive sleep apnea)   . Essential hypertension   . Colon polyps   . Carotid artery disease (HCC)     a. Carotid dopplers (7/16) with 40-59% BICA stenosis.   . Gastritis   . Elevated transaminase level   . Pulmonary nodules     a. Noted by CT in 6/15. CT chest (8/15) was thought to indicate that nodules were benign. No further workup was recommended.   . Contrast media allergy   . Low back pain       Surgical History:  Past Surgical History  Procedure Laterality Date  . Carpal tunnel release  1989    bilaterally  . Cholecystectomy  1990  . Peripheral arterial stent graft  09/20/11    right SFA  . Cardiac valve replacement  2000    aortic valve   . Laceration repair      right hand  . Neuroplasty / transposition ulnar nerve at elbow      right  . Tonsillectomy  1968  . Vaginal hysterectomy  1985    Fibroids  . Bilateral oophorectomy  1987  . Cesarean section  1969; 1971  . Breast biopsy  2012    left  . Lymph node biopsy  06/2011    "core needle on 5"  . Cataract extraction w/ intraocular lens  implant, bilateral  ~ 2007  . Refractive surgery  ` 2004    "for glaucoma"  . Needle biopsy  2009    "on ankles for nerve damage"  . Esophagogastroduodenoscopy N/A 10/27/2012    Procedure: ESOPHAGOGASTRODUODENOSCOPY (EGD);  Surgeon: Beryle Beams, MD;  Location: [facility];  Service: Endoscopy;  Laterality: N/A;  . Colonoscopy N/A 10/27/2012    Procedure: COLONOSCOPY;  Surgeon: Beryle Beams, MD;  Location: [facility] ENDOSCOPY;  Service: Endoscopy;  Laterality: N/A;  . Doppler echocardiography  03/29/2012    EF >55%; mild concentric LVH; stage 1 diastolic dysfunction, elevated LV filling pressure, dilated LA; MAC mild MR; St Jude AVR peak and mean gradients of 85mmHg and 32mmHg; transvalvular gradients have increased (prev  23 and 14 respectively)  . Cardiovascular stress test  08/25/2011    R/L MV - EF 72%; no scintigraphic evidence of inducible MI; normal perfusionTID of 1.25 elevated - could indicate small vessle subendocardial ischemia; EKG NSR at 66, non diagnostic for ischemia  . Cardiac catheterization  01/14/1999    normal LV function, severe aortic stenosis; 80% and 70% stenosis in RCA; mild 20% distal norrowing in L main with 20% proximal LAD stenosis, 40% diagonal stenosis and 20% proximal circumflex stenosis  . Femoral artery stent  09/20/2011    6 x 40 Smart Nitinol self-expanding stent placed;  10/15/2031 -R SFA stent open and patent w/o  evidence of restenosis  . Coronary artery bypass graft  2000    RIMA to RCA  . Lower extremity angiogram N/A 09/20/2011    Procedure: LOWER EXTREMITY ANGIOGRAM;  Surgeon: Lorretta Harp, MD;  Location: [facility] CATH LAB;  Service: Cardiovascular;  Laterality: N/A;  . Left and right heart catheterization with coronary angiogram N/A 12/14/2013    Procedure: LEFT AND RIGHT HEART CATHETERIZATION WITH CORONARY ANGIOGRAM;  Surgeon: Larey Dresser, MD;  Location: [facility] CATH LAB;  Service: Cardiovascular;  Laterality: N/A;  . Fractional flow reserve wire  12/14/2013    Procedure: FRACTIONAL FLOW RESERVE WIRE;  Surgeon: Larey Dresser, MD;  Location: [facility] CATH LAB;  Service: Cardiovascular;;  . Percutaneous coronary stent intervention (pci-s)  12/14/2013    Procedure: PERCUTANEOUS CORONARY STENT INTERVENTION (PCI-S);  Surgeon: Larey Dresser, MD;  Location: [facility] CATH LAB;  Service: Cardiovascular;;  Prox LAD 3.00x12 Promus DES      Home Meds: Prior to Admission medications   Medication Sig Start Date End Date Taking? Authorizing Provider  Ascorbic Acid (VITAMIN C) 500 MG tablet Take 500 mg by mouth 2 (two) times daily.    Yes Historical Provider, MD  aspirin EC 81 MG tablet Take 1 tablet (81 mg total) by mouth daily. 02/21/15  Yes Larey Dresser, MD  calcium-vitamin D (CALCIUM 500+D)  500-200 MG-UNIT per tablet Take 1 tablet by mouth 2 (two) times daily.    Yes Historical Provider, MD  citalopram (CELEXA) 20 MG tablet Take 1 tablet (20 mg total) by mouth at bedtime. 05/27/15  Yes Hoyt Koch, MD  cyclobenzaprine (FLEXERIL) 5 MG tablet TAKE 1 TABLET (5 MG TOTAL) BY MOUTH 2 (TWO) TIMES DAILY AS NEEDED FORMUSCLE SPASMS. 04/14/15  Yes Hoyt Koch, MD  ferrous sulfate 325 (65 FE) MG tablet Take 1 tablet (325 mg total) by mouth daily. 10/28/13  Yes Pattricia Boss, MD  folic acid (FOLVITE) 1 MG tablet Take 1 mg by mouth daily.   Yes Historical Provider, MD  gabapentin (NEURONTIN) 300 MG capsule TAKE 1 CAPSULE (300 MG TOTAL) BY MOUTH 3 (THREE) TIMES DAILY. Patient taking differently: TAKE 1 CAPSULE (300 MG TOTAL) BY MOUTH 3 (THREE) TIMES DAILY. PRN 06/12/15  Yes Hoyt Koch, MD  guaiFENesin (MUCINEX) 600 MG 12 hr tablet Take 600-900 mg by mouth daily as needed for cough.   Yes Historical Provider, MD  latanoprost (XALATAN) 0.005 % ophthalmic solution PLACE 1 DROP INTO BOTH EYES AT BEDTIME. 07/25/14  Yes Hoyt Koch, MD  metFORMIN (GLUCOPHAGE) 1000 MG tablet TAKE 1 TABLET (1,000 MG TOTAL) BY MOUTH 2 (TWO) TIMES DAILY WITH A MEAL. 02/20/15  Yes Hoyt Koch, MD  metoprolol succinate (TOPROL-XL) 50 MG 24 hr tablet TAKE 1 TABLET (50 MG TOTAL) BY MOUTH DAILY. 12/12/14  Yes Larey Dresser, MD  nitroGLYCERIN (NITROSTAT) 0.4 MG SL tablet Place 1 tablet (0.4 mg total) under the tongue every 5 (five) minutes as needed for chest pain. 12/15/13  Yes Isaiah Serge, NP  pantoprazole (PROTONIX) 40 MG tablet Take 1 tablet (40 mg total) by mouth daily. 09/19/14  Yes Larey Dresser, MD  potassium chloride SA (K-DUR,KLOR-CON) 20 MEQ tablet 2 tablets (40 mEq) by mouth two times a day Patient taking differently: Take 40-60 mEq by mouth 2 (two) times daily. Take 17meq in the morning an 40 meq at night 09/19/14  Yes Larey Dresser, MD  rosuvastatin (CRESTOR) 20 MG tablet Take 1  tablet by mouth twice weekly.  Patient taking differently: Take 20 mg by mouth daily.  01/14/15  Yes Lorretta Harp, MD  torsemide (DEMADEX) 20 MG tablet 3 tablets (60mg ) by mouth in the AM and 2 tablets (40mg ) by mouth in the PM 09/19/14  Yes Larey Dresser, MD  traZODone (DESYREL) 50 MG tablet TAKE 0.5-1 TABLETS (25-50 MG TOTAL) BY MOUTH AT BEDTIME AS NEEDED FORSLEEP. 04/14/15  Yes Hoyt Koch, MD  warfarin (COUMADIN) 3 MG tablet TAKE 1-1.5 TABLETS BY MOUTH DAILY AS DIRECTED BY COUMADIN CLINIC Patient taking differently: TAKE 3mg  BY MOUTH DAILY AS DIRECTED BY COUMADIN CLINIC 06/12/15  Yes Troy Sine, MD    Inpatient Medications:     Allergies:  Allergies  Allergen Reactions  . Atorvastatin     Muscle pain  . Simvastatin Other (See Comments)    Muscle pain  . Sulfa Antibiotics     unknown  . Iodinated Diagnostic Agents Rash     Red rash after cardiac cath 1 wk ago, ? Contrast allergy, requires 13 hr prep now per [email or web address]    Social History   Social History  . Marital Status: Married    Spouse Name: N/A  . Number of Children: N/A  . Years of Education: N/A   Occupational History  . Not on file.   Social History Main Topics  . Smoking status: Never Smoker   . Smokeless tobacco: Never Used  . Alcohol Use: No  . Drug Use: No  . Sexual Activity: No   Other Topics Concern  . Not on file   Social History Narrative   Pt is a high school graduate with 2 years of college. Married in 1967 she has 1 son born 32 and 1 daughter born 37 and 1 grandchild. 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     Family History  Problem Relation Age of Onset  . Colon cancer Mother   . COPD Mother   . Emphysema Mother   . Diabetes Neg Hx   . Cancer Maternal Grandmother   . Cancer Maternal Grandfather   . Heart disease Brother      Review of Systems: All other systems reviewed and are otherwise negative except as noted  above.  Labs:  Recent Labs  07/07/15 1129  TROPONINI <0.03   Lab Results  Component Value Date   WBC 4.4 07/07/2015   HGB 10.8* 07/07/2015   HCT 34.9* 07/07/2015   MCV 89.5 07/07/2015   PLT 148* 07/07/2015    Recent Labs Lab 07/07/15 1128  NA 140  K 4.6  CL 101  CO2 27  BUN 28*  CREATININE 1.18*  CALCIUM 9.8  GLUCOSE 137*   Lab Results  Component Value Date   CHOL 104* 04/14/2015   HDL 39* 04/14/2015   LDLCALC 34 04/14/2015   TRIG 157* 04/14/2015   Lab Results  Component Value Date   DDIMER <0.27 10/21/2012    Radiology/Studies:  Dg Chest 2 View  07/07/2015  CLINICAL DATA:  Left mid chest pain into the arm since Thursday EXAM: CHEST  2 VIEW COMPARISON:  08/29/2014 FINDINGS: Normal heart size and mediastinal contours. Aortic valve replacement. Prominent interstitial markings from bronchial calcification. No acute infiltrate or edema. No effusion or pneumothorax. No acute osseous findings. IMPRESSION: No evidence of active cardiopulmonary disease. Electronically Signed   By: Monte Fantasia M.D.   On: 07/07/2015 10:53    Wt Readings from Last 3 Encounters:  06/23/15 188 lb 12  oz (85.616 kg)  05/14/15 193 lb 1.9 oz (87.599 kg)  03/05/15 188 lb (85.276 kg)   EKG: NSR borderline voltage, nonspecific ST flattening but no significant change from previous EKG Telemetry NSR  Physical Exam: Blood pressure 114/54, pulse 74, temperature 98.2 F (36.8 C), temperature source Oral, resp. rate 18, SpO2 95 %. There is no weight on file to calculate BMI. General: Well developed, well nourished WF, in no acute distress. Head: Normocephalic, atraumatic, sclera non-icteric, no xanthomas, nares are without discharge.  Neck: JVD not elevated. Lungs: Good air movement, diffuse end-expiratory wheezing. Breathing is unlabored. Heart: RRR with S1 S2. Mechanical S3/S4, very soft SEM. No rubs or gallops appreciated. Abdomen: Soft, non-tender, non-distended with normoactive bowel  sounds. No hepatomegaly. No rebound/guarding. No obvious abdominal masses. Msk:  Strength and tone appear normal for age. Extremities: No clubbing or cyanosis. No edema.  Distal pedal pulses are 2+ and equal bilaterally. Neuro: Alert and oriented X 3. No facial asymmetry. No focal deficit. Moves all extremities spontaneously. Psych:  Responds to questions appropriately with a normal affect.     Assessment and Plan   1. Upper respiratory infection  2. Chronic-type chest discomfort/DOE 3. CAD s/p prior CABG, PCI as above 4. AS s/p mechanical AVR 2000 5. Chronic diastolic CHF, appearing euvolemic with normal BNP 6. Essential HTN  The presents with symptoms consistent with upper respiratory infection with recent fever, raspy cough, malaise and wheezing. She also presents with slight worsening of her chest discomfort. This could be related to her URI as there is no objective evidence of ischemia thus far despite persistence of symptoms. Note initial troponin and BNP are negative. No evidence of volume overload on exam. Per d/w Dr. Ellyn Hack, recommend checking one more delta troponin - if negative, OK to d/c home with outpatient f/u to determine whether further ischemic workup is necessary (i.e. updated stress test). She already has appointment with Dr. [name] on 07/23/15 - would keep this appointment. I discussed our recommendations with the nurse. I asked her to call the troponin result to our PA on call, whom I also signed out to. See below for additional comments from Dr. Ellyn Hack.  Signed, Charlie Pitter PA-C 07/07/2015, 5:18 PM Pager: [phone]

## 2015-07-07 NOTE — Discharge Instructions (Signed)
Viral Infections °A viral infection can be caused by different types of viruses. Most viral infections are not serious and resolve on their own. However, some infections may cause severe symptoms and may lead to further complications. °SYMPTOMS °Viruses can frequently cause: °· Minor sore throat. °· Aches and pains. °· Headaches. °· Runny nose. °· Different types of rashes. °· Watery eyes. °· Tiredness. °· Cough. °· Loss of appetite. °· Gastrointestinal infections, resulting in nausea, vomiting, and diarrhea. °These symptoms do not respond to antibiotics because the infection is not caused by bacteria. However, you might catch a bacterial infection following the viral infection. This is sometimes called a "superinfection." Symptoms of such a bacterial infection may include: °· Worsening sore throat with pus and difficulty swallowing. °· Swollen neck glands. °· Chills and a high or persistent fever. °· Severe headache. °· Tenderness over the sinuses. °· Persistent overall ill feeling (malaise), muscle aches, and tiredness (fatigue). °· Persistent cough. °· Yellow, green, or brown mucus production with coughing. °HOME CARE INSTRUCTIONS  °· Only take over-the-counter or prescription medicines for pain, discomfort, diarrhea, or fever as directed by your caregiver. °· Drink enough water and fluids to keep your urine clear or pale yellow. Sports drinks can provide valuable electrolytes, sugars, and hydration. °· Get plenty of rest and maintain proper nutrition. Soups and broths with crackers or rice are fine. °SEEK IMMEDIATE MEDICAL CARE IF:  °· You have severe headaches, shortness of breath, chest pain, neck pain, or an unusual rash. °· You have uncontrolled vomiting, diarrhea, or you are unable to keep down fluids. °· You or your child has an oral temperature above 102° F (38.9° C), not controlled by medicine. °· Your baby is older than 3 months with a rectal temperature of 102° F (38.9° C) or higher. °· Your baby is 3  months old or younger with a rectal temperature of 100.4° F (38° C) or higher. °MAKE SURE YOU:  °· Understand these instructions. °· Will watch your condition. °· Will get help right away if you are not doing well or get worse. °  °This information is not intended to replace advice given to you by your health care provider. Make sure you discuss any questions you have with your health care provider. °  °Document Released: 02/17/2005 Document Revised: 08/02/2011 Document Reviewed: 10/16/2014 °Elsevier Interactive Patient Education ©2016 [employer] ° °

## 2015-07-07 NOTE — ED Provider Notes (Signed)
CSN: [id number]     Arrival date & time 07/07/15  0946 History   First MD Initiated Contact with Patient 07/07/15 1016     Chief Complaint  Patient presents with  . Chest Pain  . Shortness of Breath     (Consider location/radiation/quality/duration/timing/severity/associated sxs/prior Treatment) HPI Comments: Pt comes in with cc of cc of chest pain, dib. Pt has hx of CAD, DM, CHF. She reports that she has been having some cough, post nasal drip since Thursday. Soon after, she started having chest pain with cough, and worsening dib with exertion. Pt also reports that she has lower chest tightness that started before the cough - and it is described as heaviness. She has no nausea, diophoresis - does report dizziness and nausea on occasion. She hasnt spit anything out with her cough and has no fevers, wheezing. She reports some weight gain, but no increased swelling in her leg, and no new / worsening orthopnea and pt has been compliant with her meds.  She has hx of AS, s/p AVR. She also has had a CABG and 2 stent placed, last stent on 11/2013.   ROS 10 Systems reviewed and are negative for acute change except as noted in the HPI.     Patient is a 70 y.o. female presenting with chest pain and shortness of breath. The history is provided by the patient.  Chest Pain Associated symptoms: shortness of breath   Shortness of Breath Associated symptoms: chest pain     Past Medical History  Diagnosis Date  . Coronary atherosclerosis of native coronary artery     a. CABG at time of AVR in 6/00 with RIMA-RCA.b. Abnl nuc 11/2013 -> LHC (7/15) with patent RIMA-RCA, 80% mRCA, 80% pLAD with FFR 0.73, treated with DES to pLAD.  Marland Kitchen Hyperlipidemia   . Chronic angle-closure glaucoma(365.23)   . Aortic stenosis     a. Now has St Jude mechanical aortic valve (6/00).b. Echo (6/15) with EF 60-65%, mechanical aortic valve with mean gradient 27 mmHg.  Marland Kitchen Peripheral vascular disease ([place])     a, H/o Right  SFA stent. b. Peripheral arterial dopplers (7/16) with right SFA stent patent.   . Type II diabetes mellitus ([place])   . Depression   . PONV (postoperative nausea and vomiting)     N&V  . Chronic diastolic CHF (congestive heart failure) ([place])     a.  RHC (7/15) with mean RA 2, PA 22/11, mean PCWP 9, CI 3.79.  Marland Kitchen GERD (gastroesophageal reflux disease)   . Neuromuscular disorder ([facility])     neuropathy  . Arthritis     a. Possible c-spine arthritis with pain down left arm.   . Anemia   . Syncope 03/29/2012    carotid doppler - R ICA 50-69% reduction by velocities (low end); L ICA 0-49% reduction by velocities (low end); R and L subclavian arteries - <50% redcution; R and L vertebral arteries show normal antegrade flow  . Dizziness 02/09/2012    a. Holter (8/14) with few PACs, otherwise unremarkable.   . OSA (obstructive sleep apnea)   . Essential hypertension   . Colon polyps   . Carotid artery disease ([facility])     a. Carotid dopplers (7/16) with 40-59% BICA stenosis.   . Gastritis   . Elevated transaminase level   . Pulmonary nodules     a. Noted by CT in 6/15. CT chest (8/15) was thought to indicate that nodules were benign. No further workup was recommended.   Marland Kitchen  Contrast media allergy   . Low back pain    Past Surgical History  Procedure Laterality Date  . Carpal tunnel release  1989    bilaterally  . Cholecystectomy  1990  . Peripheral arterial stent graft  09/20/11    right SFA  . Cardiac valve replacement  2000    aortic valve   . Laceration repair      right hand  . Neuroplasty / transposition ulnar nerve at elbow      right  . Tonsillectomy  1968  . Vaginal hysterectomy  1985    Fibroids  . Bilateral oophorectomy  1987  . Cesarean section  1969; 1971  . Breast biopsy  2012    left  . Lymph node biopsy  06/2011    "core needle on 5"  . Cataract extraction w/ intraocular lens  implant, bilateral  ~ 2007  . Refractive surgery  ` 2004    "for glaucoma"  . Needle biopsy   2009    "on ankles for nerve damage"  . Esophagogastroduodenoscopy N/A 10/27/2012    Procedure: ESOPHAGOGASTRODUODENOSCOPY (EGD);  Surgeon: Beryle Beams, MD;  Location: [facility];  Service: Endoscopy;  Laterality: N/A;  . Colonoscopy N/A 10/27/2012    Procedure: COLONOSCOPY;  Surgeon: Beryle Beams, MD;  Location: [facility] ENDOSCOPY;  Service: Endoscopy;  Laterality: N/A;  . Doppler echocardiography  03/29/2012    EF >55%; mild concentric LVH; stage 1 diastolic dysfunction, elevated LV filling pressure, dilated LA; MAC mild MR; St Jude AVR peak and mean gradients of 56mmHg and 54mmHg; transvalvular gradients have increased (prev 23 and 14 respectively)  . Cardiovascular stress test  08/25/2011    R/L MV - EF 72%; no scintigraphic evidence of inducible MI; normal perfusionTID of 1.25 elevated - could indicate small vessle subendocardial ischemia; EKG NSR at 66, non diagnostic for ischemia  . Cardiac catheterization  01/14/1999    normal LV function, severe aortic stenosis; 80% and 70% stenosis in RCA; mild 20% distal norrowing in L main with 20% proximal LAD stenosis, 40% diagonal stenosis and 20% proximal circumflex stenosis  . Femoral artery stent  09/20/2011    6 x 40 Smart Nitinol self-expanding stent placed;  10/15/2031 -R SFA stent open and patent w/o evidence of restenosis  . Coronary artery bypass graft  2000    RIMA to RCA  . Lower extremity angiogram N/A 09/20/2011    Procedure: LOWER EXTREMITY ANGIOGRAM;  Surgeon: Lorretta Harp, MD;  Location: [facility] CATH LAB;  Service: Cardiovascular;  Laterality: N/A;  . Left and right heart catheterization with coronary angiogram N/A 12/14/2013    Procedure: LEFT AND RIGHT HEART CATHETERIZATION WITH CORONARY ANGIOGRAM;  Surgeon: Larey Dresser, MD;  Location: [facility] CATH LAB;  Service: Cardiovascular;  Laterality: N/A;  . Fractional flow reserve wire  12/14/2013    Procedure: FRACTIONAL FLOW RESERVE WIRE;  Surgeon: Larey Dresser, MD;  Location: [facility] CATH LAB;   Service: Cardiovascular;;  . Percutaneous coronary stent intervention (pci-s)  12/14/2013    Procedure: PERCUTANEOUS CORONARY STENT INTERVENTION (PCI-S);  Surgeon: Larey Dresser, MD;  Location: [facility] CATH LAB;  Service: Cardiovascular;;  Prox LAD 3.00x12 Promus DES    Family History  Problem Relation Age of Onset  . Colon cancer Mother   . COPD Mother   . Emphysema Mother   . Diabetes Neg Hx   . Cancer Maternal Grandmother   . Cancer Maternal Grandfather   . Heart disease Brother    Social  History  Substance Use Topics  . Smoking status: Never Smoker   . Smokeless tobacco: Never Used  . Alcohol Use: No   OB History    No data available     Review of Systems  Respiratory: Positive for shortness of breath.   Cardiovascular: Positive for chest pain.      Allergies  Atorvastatin; Simvastatin; Sulfa antibiotics; and Iodinated diagnostic agents  Home Medications   Prior to Admission medications   Medication Sig Start Date End Date Taking? Authorizing Provider  Ascorbic Acid (VITAMIN C) 500 MG tablet Take 500 mg by mouth 2 (two) times daily.    Yes Historical Provider, MD  aspirin EC 81 MG tablet Take 1 tablet (81 mg total) by mouth daily. 02/21/15  Yes Larey Dresser, MD  calcium-vitamin D (CALCIUM 500+D) 500-200 MG-UNIT per tablet Take 1 tablet by mouth 2 (two) times daily.    Yes Historical Provider, MD  citalopram (CELEXA) 20 MG tablet Take 1 tablet (20 mg total) by mouth at bedtime. 05/27/15  Yes Hoyt Koch, MD  cyclobenzaprine (FLEXERIL) 5 MG tablet TAKE 1 TABLET (5 MG TOTAL) BY MOUTH 2 (TWO) TIMES DAILY AS NEEDED FORMUSCLE SPASMS. 04/14/15  Yes Hoyt Koch, MD  ferrous sulfate 325 (65 FE) MG tablet Take 1 tablet (325 mg total) by mouth daily. 10/28/13  Yes Pattricia Boss, MD  folic acid (FOLVITE) 1 MG tablet Take 1 mg by mouth daily.   Yes Historical Provider, MD  gabapentin (NEURONTIN) 300 MG capsule TAKE 1 CAPSULE (300 MG TOTAL) BY MOUTH 3 (THREE) TIMES  DAILY. Patient taking differently: TAKE 1 CAPSULE (300 MG TOTAL) BY MOUTH 3 (THREE) TIMES DAILY. PRN 06/12/15  Yes Hoyt Koch, MD  guaiFENesin (MUCINEX) 600 MG 12 hr tablet Take 600-900 mg by mouth daily as needed for cough.   Yes Historical Provider, MD  latanoprost (XALATAN) 0.005 % ophthalmic solution PLACE 1 DROP INTO BOTH EYES AT BEDTIME. 07/25/14  Yes Hoyt Koch, MD  metFORMIN (GLUCOPHAGE) 1000 MG tablet TAKE 1 TABLET (1,000 MG TOTAL) BY MOUTH 2 (TWO) TIMES DAILY WITH A MEAL. 02/20/15  Yes Hoyt Koch, MD  metoprolol succinate (TOPROL-XL) 50 MG 24 hr tablet TAKE 1 TABLET (50 MG TOTAL) BY MOUTH DAILY. 12/12/14  Yes Larey Dresser, MD  nitroGLYCERIN (NITROSTAT) 0.4 MG SL tablet Place 1 tablet (0.4 mg total) under the tongue every 5 (five) minutes as needed for chest pain. 12/15/13  Yes Isaiah Serge, NP  pantoprazole (PROTONIX) 40 MG tablet Take 1 tablet (40 mg total) by mouth daily. 09/19/14  Yes Larey Dresser, MD  potassium chloride SA (K-DUR,KLOR-CON) 20 MEQ tablet 2 tablets (40 mEq) by mouth two times a day Patient taking differently: Take 40-60 mEq by mouth 2 (two) times daily. Take 14meq in the morning an 40 meq at night 09/19/14  Yes Larey Dresser, MD  rosuvastatin (CRESTOR) 20 MG tablet Take 1 tablet by mouth twice weekly. Patient taking differently: Take 20 mg by mouth daily.  01/14/15  Yes Lorretta Harp, MD  torsemide (DEMADEX) 20 MG tablet 3 tablets (60mg ) by mouth in the AM and 2 tablets (40mg ) by mouth in the PM 09/19/14  Yes Larey Dresser, MD  traZODone (DESYREL) 50 MG tablet TAKE 0.5-1 TABLETS (25-50 MG TOTAL) BY MOUTH AT BEDTIME AS NEEDED FORSLEEP. 04/14/15  Yes Hoyt Koch, MD  warfarin (COUMADIN) 3 MG tablet TAKE 1-1.5 TABLETS BY MOUTH DAILY AS DIRECTED BY COUMADIN CLINIC Patient  taking differently: TAKE 3mg  BY MOUTH DAILY AS DIRECTED BY COUMADIN CLINIC 06/12/15  Yes Troy Sine, MD   BP 114/54 mmHg  Pulse 74  Temp(Src) 98.2 F (36.8  C) (Oral)  Resp 18  SpO2 95% Physical Exam  Constitutional: She is oriented to person, place, and time. She appears well-developed.  HENT:  Head: Normocephalic and atraumatic.  Eyes: Conjunctivae and EOM are normal. Pupils are equal, round, and reactive to light.  Neck: Normal range of motion. Neck supple. No JVD present.  Cardiovascular: Normal rate, regular rhythm and intact distal pulses.   Murmur heard. Pulmonary/Chest: Effort normal and breath sounds normal. No respiratory distress.  Abdominal: Soft. Bowel sounds are normal. She exhibits no distension. There is no tenderness. There is no rebound and no guarding.  Musculoskeletal: She exhibits no edema.  Neurological: She is alert and oriented to person, place, and time.  Skin: Skin is warm and dry.    ED Course  Procedures (including critical care time) Labs Review Labs Reviewed  BASIC METABOLIC PANEL - Abnormal; Notable for the following:    Glucose, Bld 137 (*)    BUN 28 (*)    Creatinine, Ser 1.18 (*)    GFR calc non Af Amer 46 (*)    GFR calc Af Amer 53 (*)    All other components within normal limits  CBC - Abnormal; Notable for the following:    Hemoglobin 10.8 (*)    HCT 34.9 (*)    RDW 16.2 (*)    Platelets 148 (*)    All other components within normal limits  BRAIN NATRIURETIC PEPTIDE  TROPONIN I    Imaging Review Dg Chest 2 View  07/07/2015  CLINICAL DATA:  Left mid chest pain into the arm since Thursday EXAM: CHEST  2 VIEW COMPARISON:  08/29/2014 FINDINGS: Normal heart size and mediastinal contours. Aortic valve replacement. Prominent interstitial markings from bronchial calcification. No acute infiltrate or edema. No effusion or pneumothorax. No acute osseous findings. IMPRESSION: No evidence of active cardiopulmonary disease. Electronically Signed   By: Monte Fantasia M.D.   On: 07/07/2015 10:53   I have personally reviewed and evaluated these images and lab results as part of my medical  decision-making.   EKG Interpretation   Date/Time:  Monday July 07 2015 10:14:52 EST Ventricular Rate:  64 PR Interval:  146 QRS Duration: 88 QT Interval:  428 QTC Calculation: 442 R Axis:   -3 Text Interpretation:  Sinus rhythm Low voltage, precordial leads Minimal  ST depression, lateral leads No acute changes No significant change since  last tracing Confirmed by Corah Willeford, MD, Thelma Comp [id number]) on 07/07/2015  10:53:29 AM      MDM   Final diagnoses:  Exertional dyspnea  Angina pectoris ([place])    Pt comes in with URI like symptoms, with some chest heaviness and exertional dyspnea. She also has pleuritic component to her chest pain. The chest heaviness however has been present since before the cough. Pt describes it as "elephant" on her chest. Pain is otherwise atypical, as it is fairly constant and has no exertional component. ACS is high on the ddx.  Pt also has exertional dyspnea. CHF is possible. Also possible is a PNA and viral infection given the uri like symptoms. CXR ordered, and is completely clear and the BNP is neg. Cant attribute the significant dib to pleuritic cause (no PE risk factors and pretest probability low, especially since she is on anticoagulation). Moreover, pt reports that her only symptoms  were DIB when she was diagnosed with significant CAD.  Hosp called- but with the story being atypical, they would prefer Cards consult prior to admission. Dr. Alvino Chapel to f/u on the recs.    Varney Biles, MD 07/07/15 [phone]

## 2015-07-09 ENCOUNTER — Other Ambulatory Visit: Payer: Self-pay | Admitting: Cardiovascular Disease

## 2015-07-14 ENCOUNTER — Encounter: Payer: Self-pay | Admitting: Diagnostic Neuroimaging

## 2015-07-14 ENCOUNTER — Ambulatory Visit (INDEPENDENT_AMBULATORY_CARE_PROVIDER_SITE_OTHER): Payer: Medicare Other | Admitting: Diagnostic Neuroimaging

## 2015-07-14 VITALS — Ht 62.0 in | Wt 187.8 lb

## 2015-07-14 DIAGNOSIS — M5412 Radiculopathy, cervical region: Secondary | ICD-10-CM | POA: Insufficient documentation

## 2015-07-14 DIAGNOSIS — M4806 Spinal stenosis, lumbar region: Secondary | ICD-10-CM | POA: Diagnosis not present

## 2015-07-14 DIAGNOSIS — E1169 Type 2 diabetes mellitus with other specified complication: Secondary | ICD-10-CM | POA: Insufficient documentation

## 2015-07-14 DIAGNOSIS — E1142 Type 2 diabetes mellitus with diabetic polyneuropathy: Secondary | ICD-10-CM | POA: Diagnosis not present

## 2015-07-14 DIAGNOSIS — R42 Dizziness and giddiness: Secondary | ICD-10-CM | POA: Diagnosis not present

## 2015-07-14 DIAGNOSIS — H8110 Benign paroxysmal vertigo, unspecified ear: Secondary | ICD-10-CM | POA: Insufficient documentation

## 2015-07-14 DIAGNOSIS — H8111 Benign paroxysmal vertigo, right ear: Secondary | ICD-10-CM

## 2015-07-14 DIAGNOSIS — M48061 Spinal stenosis, lumbar region without neurogenic claudication: Secondary | ICD-10-CM | POA: Insufficient documentation

## 2015-07-14 NOTE — Progress Notes (Signed)
[facility]  PATIENT: Abigail Oliver DOB: Sep 27, 1945  REFERRING CLINICIAN: D Mclean HISTORY FROM: patient  REASON FOR VISIT: new consult    HISTORICAL  CHIEF COMPLAINT:  Chief Complaint  Patient presents with  . Vertigo    rm 6, New patient    HISTORY OF PRESENT ILLNESS:   70 year old right-handed female here for evaluation of vertigo and dizziness. Patient has history of diabetes, heart disease, depression, mechanical heart valve, coronary artery stent, bilateral carpal tunnel release surgeries.  For past 6 months patient has had intermittent dizzy sensation which she describes as blurred vision and room spinning sensation. Symptoms typically affect her when she stands up, turns her head, especially to the right side. Symptoms can occur more often at nighttime but also occur during the day. Episodes last approximately 20 seconds at a time and can occur 8-10 times per day. No nausea or vomiting. No double vision. No headache, slurred speech, extremity weakness.  In October 2016 patient was having some left-sided weakness, had MRI of the brain which showed no acute findings. She saw another neurologist who ordered MRI of the cervical spine the patient never had this done.  Since December 2016 patient has had some right-sided weakness and pain.  Patient also has diabetic neuropathy with numbness and tingling in her toes and feet for several years.     REVIEW OF SYSTEMS: Full 14 system review of systems performed and notable only for dizziness depression anxiety not asleep decreased energy aching muscles increased thirst easy bruising fatigue swelling in legs spinning sensation.    ALLERGIES: Allergies  Allergen Reactions  . Atorvastatin     Muscle pain  . Simvastatin Other (See Comments)    Muscle pain  . Sulfa Antibiotics     unknown  . Iodinated Diagnostic Agents Rash     Red rash after cardiac cath 1 wk ago, ? Contrast allergy, requires 13 hr  prep now per [email or web address]    HOME MEDICATIONS: Outpatient Prescriptions Prior to Visit  Medication Sig Dispense Refill  . Ascorbic Acid (VITAMIN C) 500 MG tablet Take 500 mg by mouth 2 (two) times daily.     Marland Kitchen aspirin EC 81 MG tablet Take 1 tablet (81 mg total) by mouth daily.    . calcium-vitamin D (CALCIUM 500+D) 500-200 MG-UNIT per tablet Take 1 tablet by mouth 2 (two) times daily.     . citalopram (CELEXA) 20 MG tablet Take 1 tablet (20 mg total) by mouth at bedtime. 30 tablet 5  . cyclobenzaprine (FLEXERIL) 5 MG tablet TAKE 1 TABLET (5 MG TOTAL) BY MOUTH 2 (TWO) TIMES DAILY AS NEEDED FORMUSCLE SPASMS. 60 tablet 1  . ferrous sulfate 325 (65 FE) MG tablet Take 1 tablet (325 mg total) by mouth daily. 30 tablet 0  . folic acid (FOLVITE) 1 MG tablet Take 1 mg by mouth daily.    Marland Kitchen gabapentin (NEURONTIN) 300 MG capsule TAKE 1 CAPSULE (300 MG TOTAL) BY MOUTH 3 (THREE) TIMES DAILY. (Patient taking differently: TAKE 1 CAPSULE (300 MG TOTAL) BY MOUTH 3 (THREE) TIMES DAILY. PRN) 90 capsule 0  . guaiFENesin (MUCINEX) 600 MG 12 hr tablet Take 600-900 mg by mouth daily as needed for cough.    . latanoprost (XALATAN) 0.005 % ophthalmic solution PLACE 1 DROP INTO BOTH EYES AT BEDTIME. 2.5 mL 2  . metFORMIN (GLUCOPHAGE) 1000 MG tablet TAKE 1 TABLET (1,000 MG TOTAL) BY MOUTH 2 (TWO) TIMES DAILY WITH A MEAL. 60 tablet 11  . metoprolol  succinate (TOPROL-XL) 50 MG 24 hr tablet TAKE 1 TABLET (50 MG TOTAL) BY MOUTH DAILY. 30 tablet 11  . nitroGLYCERIN (NITROSTAT) 0.4 MG SL tablet Place 1 tablet (0.4 mg total) under the tongue every 5 (five) minutes as needed for chest pain. 25 tablet 3  . pantoprazole (PROTONIX) 40 MG tablet Take 1 tablet (40 mg total) by mouth daily. 90 tablet 3  . potassium chloride SA (K-DUR,KLOR-CON) 20 MEQ tablet 2 tablets (40 mEq) by mouth two times a day (Patient taking differently: Take 40-60 mEq by mouth 2 (two) times daily. Take 60meq in the morning an 40 meq at night) 360  tablet 3  . rosuvastatin (CRESTOR) 20 MG tablet Take 1 tablet by mouth twice weekly. (Patient taking differently: Take 20 mg by mouth daily. ) 90 tablet 0  . torsemide (DEMADEX) 20 MG tablet 3 tablets (60mg ) by mouth in the AM and 2 tablets (40mg ) by mouth in the PM 450 tablet 3  . traZODone (DESYREL) 50 MG tablet TAKE 0.5-1 TABLETS (25-50 MG TOTAL) BY MOUTH AT BEDTIME AS NEEDED FORSLEEP. 30 tablet 3  . warfarin (COUMADIN) 3 MG tablet Take 1-1.5 tablets by mouth daily as directed by coumadin clinic, need INR for further refills 40 tablet 0  . warfarin (COUMADIN) 3 MG tablet TAKE 1-1.5 TABLETS BY MOUTH DAILY AS DIRECTED BY COUMADIN CLINIC (Patient taking differently: TAKE 3mg  BY MOUTH DAILY AS DIRECTED BY COUMADIN CLINIC) 40 tablet 1   No facility-administered medications prior to visit.    PAST MEDICAL HISTORY: Past Medical History  Diagnosis Date  . Coronary atherosclerosis of native coronary artery     a. CABG at time of AVR in 6/00 with RIMA-RCA.b. Abnl nuc 11/2013 -> LHC (7/15) with patent RIMA-RCA, 80% mRCA, 80% pLAD with FFR 0.73, treated with DES to pLAD.  Marland Kitchen Hyperlipidemia   . Chronic angle-closure glaucoma(365.23)   . Aortic stenosis     a. Now has St Jude mechanical aortic valve (6/00).b. Echo (6/15) with EF 60-65%, mechanical aortic valve with mean gradient 27 mmHg.  Marland Kitchen Peripheral vascular disease ([place])     a, H/o Right SFA stent. b. Peripheral arterial dopplers (7/16) with right SFA stent patent.   . Type II diabetes mellitus ([place])   . Depression   . PONV (postoperative nausea and vomiting)     N&V  . Chronic diastolic CHF (congestive heart failure) ([place])     a.  RHC (7/15) with mean RA 2, PA 22/11, mean PCWP 9, CI 3.79.  Marland Kitchen GERD (gastroesophageal reflux disease)   . Neuromuscular disorder (H[facility])     neuropathy  . Arthritis     a. Possible c-spine arthritis with pain down left arm.   . Anemia   . Syncope 03/29/2012    carotid doppler - R ICA 50-69% reduction by velocities  (low end); L ICA 0-49% reduction by velocities (low end); R and L subclavian arteries - <50% redcution; R and L vertebral arteries show normal antegrade flow  . Dizziness 02/09/2012    a. Holter (8/14) with few PACs, otherwise unremarkable.   . OSA (obstructive sleep apnea)   . Essential hypertension   . Colon polyps   . Carotid artery disease (HCC)     a. Carotid dopplers (7/16) with 40-59% BICA stenosis.   . Gastritis   . Elevated transaminase level   . Pulmonary nodules     a. Noted by CT in 6/15. CT chest (8/15) was thought to indicate that nodules were benign. No  further workup was recommended.   . Contrast media allergy   . Low back pain   . Mechanical heart valve present     PAST SURGICAL HISTORY: Past Surgical History  Procedure Laterality Date  . Carpal tunnel release  1989    bilaterally  . Cholecystectomy  1990  . Peripheral arterial stent graft  09/20/11    right SFA  . Cardiac valve replacement  2000    aortic valve   . Laceration repair      right hand  . Neuroplasty / transposition ulnar nerve at elbow      right  . Tonsillectomy  1968  . Vaginal hysterectomy  1985    Fibroids  . Bilateral oophorectomy  1987  . Cesarean section  1969; 1971  . Breast biopsy  2012    left  . Lymph node biopsy  06/2011    "core needle on 5"  . Cataract extraction w/ intraocular lens  implant, bilateral  ~ 2007  . Refractive surgery  ` 2004    "for glaucoma"  . Needle biopsy  2009    "on ankles for nerve damage"  . Esophagogastroduodenoscopy N/A 10/27/2012    Procedure: ESOPHAGOGASTRODUODENOSCOPY (EGD);  Surgeon: Beryle Beams, MD;  Location: [facility];  Service: Endoscopy;  Laterality: N/A;  . Colonoscopy N/A 10/27/2012    Procedure: COLONOSCOPY;  Surgeon: Beryle Beams, MD;  Location: [facility];  Service: Endoscopy;  Laterality: N/A;  . Doppler echocardiography  03/29/2012    EF >55%; mild concentric LVH; stage 1 diastolic dysfunction, elevated LV filling pressure,  dilated LA; MAC mild MR; St Jude AVR peak and mean gradients of 50mmHg and 69mmHg; transvalvular gradients have increased (prev 23 and 14 respectively)  . Cardiovascular stress test  08/25/2011    R/L MV - EF 72%; no scintigraphic evidence of inducible MI; normal perfusionTID of 1.25 elevated - could indicate small vessle subendocardial ischemia; EKG NSR at 66, non diagnostic for ischemia  . Cardiac catheterization  01/14/1999    normal LV function, severe aortic stenosis; 80% and 70% stenosis in RCA; mild 20% distal norrowing in L main with 20% proximal LAD stenosis, 40% diagonal stenosis and 20% proximal circumflex stenosis  . Femoral artery stent  09/20/2011    6 x 40 Smart Nitinol self-expanding stent placed;  10/15/2031 -R SFA stent open and patent w/o evidence of restenosis  . Coronary artery bypass graft  2000    RIMA to RCA  . Lower extremity angiogram N/A 09/20/2011    Procedure: LOWER EXTREMITY ANGIOGRAM;  Surgeon: Lorretta Harp, MD;  Location: [facility] CATH LAB;  Service: Cardiovascular;  Laterality: N/A;  . Left and right heart catheterization with coronary angiogram N/A 12/14/2013    Procedure: LEFT AND RIGHT HEART CATHETERIZATION WITH CORONARY ANGIOGRAM;  Surgeon: Larey Dresser, MD;  Location: [facility] CATH LAB;  Service: Cardiovascular;  Laterality: N/A;  . Fractional flow reserve wire  12/14/2013    Procedure: FRACTIONAL FLOW RESERVE WIRE;  Surgeon: Larey Dresser, MD;  Location: [facility] CATH LAB;  Service: Cardiovascular;;  . Percutaneous coronary stent intervention (pci-s)  12/14/2013    Procedure: PERCUTANEOUS CORONARY STENT INTERVENTION (PCI-S);  Surgeon: Larey Dresser, MD;  Location: [facility] CATH LAB;  Service: Cardiovascular;;  Prox LAD 3.00x12 Promus DES     FAMILY HISTORY: Family History  Problem Relation Age of Onset  . Colon cancer Mother   . COPD Mother   . Emphysema Mother   . Diabetes Neg Hx   .  Cancer Maternal Grandmother   . Cancer Maternal Grandfather   . Heart disease Brother       SOCIAL HISTORY:  Social History   Social History  . Marital Status: Married    Spouse Name: [name]  . Number of Children: 2  . Years of Education: 14   Occupational History  .      hair dresser   Social History Main Topics  . Smoking status: Never Smoker   . Smokeless tobacco: Never Used  . Alcohol Use: No  . Drug Use: No  . Sexual Activity: No   Other Topics Concern  . Not on file   Social History Narrative   Pt is a high school graduate with 2 years of college. Married in 1967 she has 1 son born 78 and 1 daughter born 77 and 1 grandchild. 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      Caffeine use- 2-3 /day, soda, tea      PHYSICAL EXAM  GENERAL EXAM/CONSTITUTIONAL: Vitals:  Filed Vitals:   07/14/15 1123  Height: 5\' 2"  (1.575 m)  Weight: 187 lb 12.8 oz (85.186 kg)     Body mass index is 34.34 kg/(m^2).  No exam data present  Patient is in no distress; well developed, nourished and groomed; neck is supple  CARDIOVASCULAR:  Examination of carotid arteries is normal; no carotid bruits  Regular rate and rhythm, no murmurs; MECHANICAL CLICK  Examination of peripheral vascular system by observation and palpation is normal  EYES:  Ophthalmoscopic exam of optic discs and posterior segments is normal; no papilledema or hemorrhages  MUSCULOSKELETAL:  Gait, strength, tone, movements noted in Neurologic exam below  NEUROLOGIC: MENTAL STATUS:  No flowsheet data found.  awake, alert, oriented to person, place and time  recent and remote memory intact  normal attention and concentration  language fluent, comprehension intact, naming intact,   fund of knowledge appropriate  CRANIAL NERVE:   2nd - no papilledema on fundoscopic exam  2nd, 3rd, 4th, 6th - pupils equal and reactive to light, visual fields full to confrontation, extraocular muscles intact, no nystagmus  5th - facial sensation symmetric  7th - facial  strength symmetric  8th - hearing intact  9th - palate elevates symmetrically, uvula midline  11th - shoulder shrug symmetric  12th - tongue protrusion midline  DIX HALLPIKE TESTING POSITIVE WITH HEAD TURNED TO RIGHT; WITH POSITIVE NYSTAGMUS AND SYMPTOMS  MOTOR:   normal bulk and tone, full strength in the BUE, BLE  SENSORY:   normal and symmetric to light touch, temperature, vibration; EXCEPT DECR IN RUE AND RLE COMPARED TO LEFT SIDE  COORDINATION:   finger-nose-finger, fine finger movements normal  REFLEXES:   deep tendon reflexes present and symmetric; EXCEPT ABSENT IN LEFT ANKLE  GAIT/STATION:   narrow based gait; SLIGHTLY ANTALGIC, SLIGHTLY UNSTEADY    DIAGNOSTIC DATA (LABS, IMAGING, TESTING) - I reviewed patient records, labs, notes, testing and imaging myself where available.  Lab Results  Component Value Date   WBC 4.4 07/07/2015   HGB 10.8* 07/07/2015   HCT 34.9* 07/07/2015   MCV 89.5 07/07/2015   PLT 148* 07/07/2015      Component Value Date/Time   NA 140 07/07/2015 1128   NA 138 12/21/2012 1043   K 4.6 07/07/2015 1128   K 3.9 12/21/2012 1043   CL 101 07/07/2015 1128   CO2 27 07/07/2015 1128   CO2 28 12/21/2012 1043   GLUCOSE 137* 07/07/2015  1128   GLUCOSE 313* 12/21/2012 1043   BUN 28* 07/07/2015 1128   BUN 28.5* 12/21/2012 1043   CREATININE 1.18* 07/07/2015 1128   CREATININE 1.09* 02/21/2015 1709   CREATININE 1.1 12/21/2012 1043   CALCIUM 9.8 07/07/2015 1128   CALCIUM 10.1 12/21/2012 1043   PROT 7.4 04/14/2015 0814   PROT 8.4* 12/21/2012 1043   ALBUMIN 4.3 04/14/2015 0814   ALBUMIN 4.4 12/21/2012 1043   AST 23 04/14/2015 0814   AST 49* 12/21/2012 1043   ALT 16 04/14/2015 0814   ALT 40 12/21/2012 1043   ALKPHOS 62 04/14/2015 0814   ALKPHOS 82 12/21/2012 1043   BILITOT 0.8 04/14/2015 0814   BILITOT 1.33* 12/21/2012 1043   GFRNONAA 46* 07/07/2015 1128   GFRAA 53* 07/07/2015 1128   Lab Results  Component Value Date   CHOL 104*  04/14/2015   HDL 39* 04/14/2015   LDLCALC 34 04/14/2015   LDLDIRECT 138.0 10/02/2014   TRIG 157* 04/14/2015   CHOLHDL 2.7 04/14/2015   Lab Results  Component Value Date   HGBA1C 5.8* 10/29/2013   Lab Results  Component Value Date   VITAMINB12 326 10/22/2012   Lab Results  Component Value Date   TSH 2.510 02/21/2015    02/24/15 MRI brain [I reviewed images myself and agree with interpretation. -VRP]  - No acute infarct. - Mild to moderate small vessel disease type changes. - Global atrophy without hydrocephalus. - No intracranial hemorrhage. - No intracranial mass or abnormal enhancement. - Major intracranial vascular structures are patent. - Minimal partial opacification right mastoid air cells.    ASSESSMENT AND PLAN  70 y.o. year old female here wi6 months of intermittent positional vertigo, likely related to benign positional vertigo versus labyrinthitis. Will refer patient for vestibular PT rehabilitation for further treatment. Patient also with cervical and lumbar degenerative spine disease, and diabetic neuropathy, likely the cause of her previously left and now right-sided pain and weakness. Patient reluctant to consider surgical evaluation at this time. Will recommend conservative treatment for now. Also with significant depression, anxiety, insomnia, which may need further treatment per PCP or psychiatry.   Dx:  1. Benign paroxysmal positional vertigo, right   2. Dizziness and giddiness   3. Diabetic polyneuropathy associated with type 2 diabetes mellitus ([place])   4. Spinal stenosis, lumbar region, without neurogenic claudication   5. Cervical radiculopathy      PLAN: - vestibular PT rehab - continue medical mgmt or stroke risk factors  Orders Placed This Encounter  Procedures  . PT vestibular rehab   Return in about 3 months (around 10/11/2015).    Penni Bombard, MD [id number], [id number] PM Certified in Neurology, Neurophysiology and  Neuroimaging  [facility] [address] [place], [place] [address] [phone]

## 2015-07-14 NOTE — Patient Instructions (Signed)
Thank you for coming to see [place] at [facility]. I hope we have been able to provide you high quality care today.  You may receive a patient satisfaction survey over the next few weeks. We would appreciate your feedback and comments so that we may continue to improve ourselves and the health of our patients.  - I will setup vestibular PT rehab for balance and vertigo treatment exercises   ~~~~~~~~~~~~~~~~~~~~~~~~~~~~~~~~~~~~~~~~~~~~~~~~~~~~~~~~~~~~~~~~~  DR. Sachi Boulay'S GUIDE TO HAPPY AND HEALTHY LIVING These are some of my general health and wellness recommendations. Some of them may apply to you better than others. Please use common sense as you try these suggestions and feel free to ask me any questions.   ACTIVITY/FITNESS Mental, social, emotional and physical stimulation are very important for brain and body health. Try learning a new activity (arts, music, language, sports, games).  Keep moving your body to the best of your abilities. You can do this at home, inside or outside, the park, community center, gym or anywhere you like. Consider a physical therapist or personal trainer to get started. Consider the app Sworkit. Fitness trackers such as smart-watches, smart-phones or Fitbits can help as well.   NUTRITION Eat more plants: colorful vegetables, nuts, seeds and berries.  Eat less sugar, salt, preservatives and processed foods.  Avoid toxins such as cigarettes and alcohol.  Drink water when you are thirsty. Warm water with a slice of lemon is an excellent morning drink to start the day.  Consider these websites for more information The Nutrition Source (https://www.henry-hernandez.biz/) Precision Nutrition (WindowBlog.ch)   RELAXATION Consider practicing mindfulness meditation or other relaxation techniques such as deep breathing, prayer, yoga, tai chi, massage. See website mindful.org or the apps Headspace or  Calm to help get started.   SLEEP Try to get at least 7-8+ hours sleep per day. Regular exercise and reduced caffeine will help you sleep better. Practice good sleep hygeine techniques. See website sleep.org for more information.   PLANNING Prepare estate planning, living will, healthcare POA documents. Sometimes this is best planned with the help of an attorney. Theconversationproject.org and agingwithdignity.org are excellent resources.

## 2015-07-15 ENCOUNTER — Telehealth: Payer: Self-pay | Admitting: Internal Medicine

## 2015-07-15 DIAGNOSIS — F32A Depression, unspecified: Secondary | ICD-10-CM

## 2015-07-15 DIAGNOSIS — F329 Major depressive disorder, single episode, unspecified: Secondary | ICD-10-CM

## 2015-07-15 NOTE — Telephone Encounter (Signed)
Referral done

## 2015-07-15 NOTE — Telephone Encounter (Signed)
Patient aware.

## 2015-07-15 NOTE — Telephone Encounter (Signed)
Patient is requesting that Dr. Sharlet Salina refer her to a psychiatrist because she is extremely depressed and she is experiencing insomnia.  Patient is requesting a call back in regards either way to notify her if this will or will not be done.

## 2015-07-23 ENCOUNTER — Encounter (HOSPITAL_COMMUNITY): Payer: Self-pay

## 2015-07-23 ENCOUNTER — Ambulatory Visit (HOSPITAL_COMMUNITY)
Admission: RE | Admit: 2015-07-23 | Discharge: 2015-07-23 | Disposition: A | Discharge status: Home / Self Care | Payer: Medicare Other | Source: Ambulatory Visit | Attending: Cardiology | Admitting: Cardiology

## 2015-07-23 VITALS — BP 119/51 | HR 72 | Wt 186.5 lb

## 2015-07-23 DIAGNOSIS — Z959 Presence of cardiac and vascular implant and graft, unspecified: Secondary | ICD-10-CM | POA: Diagnosis not present

## 2015-07-23 DIAGNOSIS — R079 Chest pain, unspecified: Secondary | ICD-10-CM | POA: Diagnosis not present

## 2015-07-23 DIAGNOSIS — G4733 Obstructive sleep apnea (adult) (pediatric): Secondary | ICD-10-CM | POA: Insufficient documentation

## 2015-07-23 DIAGNOSIS — E785 Hyperlipidemia, unspecified: Secondary | ICD-10-CM | POA: Diagnosis not present

## 2015-07-23 DIAGNOSIS — Z952 Presence of prosthetic heart valve: Secondary | ICD-10-CM | POA: Insufficient documentation

## 2015-07-23 DIAGNOSIS — K219 Gastro-esophageal reflux disease without esophagitis: Secondary | ICD-10-CM | POA: Diagnosis not present

## 2015-07-23 DIAGNOSIS — I2510 Atherosclerotic heart disease of native coronary artery without angina pectoris: Secondary | ICD-10-CM | POA: Diagnosis not present

## 2015-07-23 DIAGNOSIS — I110 Hypertensive heart disease with heart failure: Secondary | ICD-10-CM | POA: Insufficient documentation

## 2015-07-23 DIAGNOSIS — Z7982 Long term (current) use of aspirin: Secondary | ICD-10-CM | POA: Diagnosis not present

## 2015-07-23 DIAGNOSIS — I6523 Occlusion and stenosis of bilateral carotid arteries: Secondary | ICD-10-CM | POA: Insufficient documentation

## 2015-07-23 DIAGNOSIS — Z91041 Radiographic dye allergy status: Secondary | ICD-10-CM | POA: Diagnosis not present

## 2015-07-23 DIAGNOSIS — I5032 Chronic diastolic (congestive) heart failure: Secondary | ICD-10-CM | POA: Insufficient documentation

## 2015-07-23 DIAGNOSIS — Z7984 Long term (current) use of oral hypoglycemic drugs: Secondary | ICD-10-CM | POA: Diagnosis not present

## 2015-07-23 DIAGNOSIS — Z79899 Other long term (current) drug therapy: Secondary | ICD-10-CM | POA: Insufficient documentation

## 2015-07-23 DIAGNOSIS — Z7901 Long term (current) use of anticoagulants: Secondary | ICD-10-CM | POA: Insufficient documentation

## 2015-07-23 DIAGNOSIS — R42 Dizziness and giddiness: Secondary | ICD-10-CM | POA: Insufficient documentation

## 2015-07-23 DIAGNOSIS — E119 Type 2 diabetes mellitus without complications: Secondary | ICD-10-CM | POA: Insufficient documentation

## 2015-07-23 DIAGNOSIS — Z951 Presence of aortocoronary bypass graft: Secondary | ICD-10-CM | POA: Insufficient documentation

## 2015-07-23 DIAGNOSIS — I739 Peripheral vascular disease, unspecified: Secondary | ICD-10-CM | POA: Diagnosis not present

## 2015-07-23 DIAGNOSIS — Z8249 Family history of ischemic heart disease and other diseases of the circulatory system: Secondary | ICD-10-CM | POA: Insufficient documentation

## 2015-07-23 LAB — LIPID PANEL
Cholesterol: 174 mg/dL (ref 0–200)
HDL: 44 mg/dL (ref >40)
LDL Cholesterol: 83 mg/dL (ref 0–99)
Total CHOL/HDL Ratio: 4 RATIO
Triglycerides: 236 mg/dL — ABNORMAL HIGH (ref <150)
VLDL: 47 mg/dL — ABNORMAL HIGH (ref 0–40)

## 2015-07-23 NOTE — Patient Instructions (Signed)
Your physician has requested that you have a lexiscan myoview. For further information please visit [email or web address]. Please follow instruction sheet, as given.  We will contact you in 4 months to schedule your next appointment.  

## 2015-07-23 NOTE — Progress Notes (Signed)
Patient ID: RAYNETTE CLIFFE, female   DOB: 16-Oct-1945, 70 y.o.   MRN: [id number] PCP: Dr. Sharlet Salina  70 yo with history of aortic stenosis s/p St Jude mechanical AVR (6/00) and CAD s/p single vessel CABG (6/00) presents for cardiology evaluation.  She had severe aortic stenosis and therefore required CABG-AVR in 6/00.  She had a RIMA-RCA. She had a myoview in 4/13 with no ischemia or infarction, and echo in 6/14 showed normal EF with moderate diastolic dysfunction and a well-seated mechanical valve.    In 6/15, torsemide was stopped and she was begun on Lasix due to increased cost of torsemide.  She developed dyspnea and lower extremity edema.  She was admitted later that month with community-acquired PNA as well as CHF.  CT showed RLL PNA and multiple pulmonary nodules.  Echo in 6/15 showed EF 60-65%, mechanical aortic valve with mean gradient 27 mmHg. After discharge, she continued to be short of breath.  Lasix was increased to 80 mg bid.  This did not help much.  She was short of breath walking around in her yard.  No chest pain.  Lexiscan Cardiolite was done in 7/15 given the exertional symptoms and showed a small area of basal inferolateral ischemia.   Given ongoing exertional symptoms, I did a right and left heart catheterization in 7/15.  Right and left heart filling pressures were normal.  There was an 80% stenosis in the proximal LAD that was hemodynamically significant by FFR. Patient had DES to proximal LAD.    She has had left-sided arm and also leg weakness for a number of months. MRI head showed no CVA.  She was seen by neurology and c-spine arthritis was suspected for the arm weakness, but she never had the c-spine MRI done.    She has BPPV and was referred by neurology to vestibular rehab.  She remains short of breath walking 100-150 feet.  Chronic orthopnea x years. No melena/BRBPR.  She has generalized fatigue.  Some chest heaviness, tends to occur when she is more active but it is very  mild and has been going on a long time.  Not like prior angina.  Event monitor in 2/17 did not show a significant arrhythmia.   Labs (6/14): BNP 216, HCT 32, K 4.8, creatinine 0.8 Labs (8/14): K 4.4, creatinine 0.8, BNP 55, HDL 45, LDL 141 Labs (10/14): LDL 61, HDL 43, AST 40, ALT 31 Labs (6/15): BNP 37 Labs (7/15): K 4.4, creatinine 0.8 => 0.77 Labs (9/15): HCT 34.5, LDL 32, HDL 40 Labs (4/16): K 3.8, creatinine 0.96 Labs (5/16): K 4.4, creatinine 1.02, LDL 138 Labs (6/16): K 4.3, creatinine 1.34, BNP 83 Labs (9/16): TSH normal Labs (12/16): K 3.8, creatinine 1.05, BNP 87 Labs (2/17): BNP 35, K 4.6, creatinine 1.18, HCT 34.9  PMH: 1. Aortic stenosis: Now has St Jude mechanical aortic valve (6/00).  Echo (6/14) with EF 60-65%, mild LVH, moderate diastolic dysfunction, normal RV, mechanical aortic valve with mean gradient 20 mmHg.  Echo (6/15) with EF 60-65%, mechanical aortic valve with mean gradient 27 mmHg.  2. CAD: CABG at time of AVR in 6/00 with RIMA-RCA.  Myoview in 4/13 with EF 72%, no ischemia or infarction. Lexiscan Cardiolite (7/15) with EF 69%, small area of basal inferolateral ischemia.  LHC (7/15) with patent RIMA-RCA, 80% mRCA, 80% pLAD with FFR 0.73, treated with DES to pLAD.  3. HTN 4. Type II diabetes 5. GERD 6. OSA 7. Hyperlipidemia 8. PAD: Right SFA stent. Peripheral arterial  dopplers (7/15) with patent right SFA stent. Peripheral arterial dopplers (7/16) with right SFA stent patent.  9. Colon polyps 10. Carotid stenosis: Carotid dopplers (11/13) witih 50-69% RICA.  Carotid dopplers (7/15) with 40-59% RICA. Carotid dopplers (7/16) with 40-59% BICA stenosis.  11. Diastolic CHF 12. Gastritis 13. Dizziness: Holter (8/14) with few PACs, otherwise unremarkable.  Event monitor (2/17) with no significant arrhythmias.  Dizziness is probably due to BPPV.  14. Mild transaminase elevation 15. Pulmonary nodules: Noted by CT in 6/15.  CT chest (8/15) was thought to indicate that  nodules were benign.  No further workup was recommended.  16. Contrast allergy 17. Diastolic CHF: RHC (123456) with mean RA 2, PA 22/11, mean PCWP 9, CI 3.79 18. Low back pain. 19. Possible c-spine arthritis with pain down left arm.    SH: Married, works as Theme park manager, nonsmoker.   FH: CAD  ROS: All systems reviewed and negative except as per HPI.   Current Outpatient Prescriptions  Medication Sig Dispense Refill  . Ascorbic Acid (VITAMIN C) 500 MG tablet Take 500 mg by mouth 2 (two) times daily.     Marland Kitchen aspirin EC 81 MG tablet Take 1 tablet (81 mg total) by mouth daily.    . calcium-vitamin D (CALCIUM 500+D) 500-200 MG-UNIT per tablet Take 1 tablet by mouth 2 (two) times daily.     . citalopram (CELEXA) 20 MG tablet Take 1 tablet (20 mg total) by mouth at bedtime. 30 tablet 5  . ferrous sulfate 325 (65 FE) MG tablet Take 1 tablet (325 mg total) by mouth daily. 30 tablet 0  . folic acid (FOLVITE) 1 MG tablet Take 1 mg by mouth daily.    Marland Kitchen latanoprost (XALATAN) 0.005 % ophthalmic solution PLACE 1 DROP INTO BOTH EYES AT BEDTIME. 2.5 mL 2  . metFORMIN (GLUCOPHAGE) 1000 MG tablet TAKE 1 TABLET (1,000 MG TOTAL) BY MOUTH 2 (TWO) TIMES DAILY WITH A MEAL. 60 tablet 11  . metoprolol succinate (TOPROL-XL) 50 MG 24 hr tablet TAKE 1 TABLET (50 MG TOTAL) BY MOUTH DAILY. 30 tablet 11  . pantoprazole (PROTONIX) 40 MG tablet Take 1 tablet (40 mg total) by mouth daily. 90 tablet 3  . potassium chloride SA (K-DUR,KLOR-CON) 20 MEQ tablet Take 40-60 mEq by mouth as directed. Take 3 tablets (60 meq) in the AM and 2 tablets (40 meq) in the PM    . rosuvastatin (CRESTOR) 20 MG tablet Take 1 tablet by mouth twice weekly. 90 tablet 0  . torsemide (DEMADEX) 20 MG tablet 3 tablets (60mg ) by mouth in the AM and 2 tablets (40mg ) by mouth in the PM 450 tablet 3  . traZODone (DESYREL) 50 MG tablet TAKE 0.5-1 TABLETS (25-50 MG TOTAL) BY MOUTH AT BEDTIME AS NEEDED FORSLEEP. 30 tablet 3  . warfarin (COUMADIN) 3 MG tablet  Take 1-1.5 tablets by mouth daily as directed by coumadin clinic, need INR for further refills 40 tablet 0  . cyclobenzaprine (FLEXERIL) 5 MG tablet TAKE 1 TABLET (5 MG TOTAL) BY MOUTH 2 (TWO) TIMES DAILY AS NEEDED FORMUSCLE SPASMS. (Patient not taking: Reported on 07/23/2015) 60 tablet 1  . nitroGLYCERIN (NITROSTAT) 0.4 MG SL tablet Place 1 tablet (0.4 mg total) under the tongue every 5 (five) minutes as needed for chest pain. (Patient not taking: Reported on 07/23/2015) 25 tablet 3   No current facility-administered medications for this encounter.    BP 119/51 mmHg  Pulse 72  Wt 186 lb 8 oz (84.596 kg)  SpO2 99% General:  NAD Neck: JVP 7 cm, no thyromegaly or thyroid nodule.  Lungs: Clear to auscultation bilaterally with normal respiratory effort. CV: Nondisplaced PMI.  Heart regular S1/S2, mechanical S2, no S3/S4, 1/6 early SEM RUSB.  No ankle edema.  No carotid bruit.  Normal pedal pulses.  Abdomen: Soft, nontender, no hepatosplenomegaly, mild distention.  Neurologic: Alert and oriented x 3.  Psych: Normal affect. Extremities: No clubbing or cyanosis.    Assessment/Plan: 1. St Jude mechanical aortic valve:  Mildly elevated mean gradient for this valve on echo in 6/15.  Continue coumadin and ASA 81 daily.   - She will need an echo done this year to follow AoV gradient.   2. Chronic diastolic CHF: NYHA class II-III symptoms, stable.  She is not volume overloaded on exam.  - Continue current torsemide and KCl.  3. CAD: s/p RIMA-RCA at time of AVR.  She developed exertional dyspnea and had cath showing significant proximal LAD stenosis in 7/15 that was treated with BMS.  She has very mild chest heaviness that has been going on for a while as well as prominent fatigue.   - I will arrange for Lexiscan Cardiolite to assess for ischemia.  - She is now off Plavix.  Given mechanical valve, She is on regimen of ASA 81 daily + warfarin.  - Continue statin.   4. Hyperlipidemia: Myalgias with  atorvastatin and simvastatin.  She is tolerating Crestor 20 mg biw.  Check lipids today. 5. Carotid stenosis: Repeat carotid dopplers 7/17.  6. PAD: Known PAD with right SFA stent.  Last peripheral arterial dopplers showed patent R SFA stent.  Repeat peripheral arterial dopplers 7/17.  7. "Dizziness:"  I think this is due to BPPV.  She has been referred to vestibular rehab.    Followup in 4 months if Cardiolite is normal.  Loralie Champagne 07/23/2015

## 2015-07-24 ENCOUNTER — Encounter (HOSPITAL_COMMUNITY): Payer: Self-pay | Admitting: *Deleted

## 2015-07-30 ENCOUNTER — Other Ambulatory Visit: Payer: Self-pay | Admitting: Cardiovascular Disease

## 2015-07-30 ENCOUNTER — Telehealth (HOSPITAL_COMMUNITY): Payer: Self-pay | Admitting: *Deleted

## 2015-07-30 NOTE — Telephone Encounter (Signed)
Patient given detailed instructions per Myocardial Perfusion Study Information Sheet for the test on 08/04/15 Patient notified to arrive 15 minutes early and that it is imperative to arrive on time for appointment to keep from having the test rescheduled.  If you need to cancel or reschedule your appointment, please call the office within 24 hours of your appointment. Failure to do so may result in a cancellation of your appointment, and a $50 no show fee. Patient verbalized understanding.Hubbard Robinson, RN

## 2015-07-31 ENCOUNTER — Encounter: Payer: Medicare Other | Admitting: Pharmacist Clinician (PhC)/ Clinical Pharmacy Specialist

## 2015-08-04 ENCOUNTER — Ambulatory Visit (HOSPITAL_COMMUNITY): Payer: Medicare Other | Attending: Cardiovascular Disease

## 2015-08-04 DIAGNOSIS — E119 Type 2 diabetes mellitus without complications: Secondary | ICD-10-CM | POA: Diagnosis not present

## 2015-08-04 DIAGNOSIS — R079 Chest pain, unspecified: Secondary | ICD-10-CM | POA: Insufficient documentation

## 2015-08-04 DIAGNOSIS — R0609 Other forms of dyspnea: Secondary | ICD-10-CM | POA: Diagnosis not present

## 2015-08-04 DIAGNOSIS — R5383 Other fatigue: Secondary | ICD-10-CM | POA: Diagnosis not present

## 2015-08-04 DIAGNOSIS — I779 Disorder of arteries and arterioles, unspecified: Secondary | ICD-10-CM | POA: Diagnosis not present

## 2015-08-04 MED ORDER — REGADENOSON 0.4 MG/5ML IV SOLN
0.4000 mg | Freq: Once | INTRAVENOUS | Status: AC
  Administered 2015-08-04: 0.4 mg via INTRAVENOUS

## 2015-08-04 MED ORDER — TECHNETIUM TC 99M SESTAMIBI GENERIC - CARDIOLITE
32.9000 | Freq: Once | INTRAVENOUS | Status: AC | PRN
  Administered 2015-08-04: 32.9 via INTRAVENOUS

## 2015-08-06 ENCOUNTER — Other Ambulatory Visit: Payer: Self-pay | Admitting: Internal Medicine

## 2015-08-06 ENCOUNTER — Ambulatory Visit (HOSPITAL_COMMUNITY): Payer: Medicare Other | Attending: Cardiology

## 2015-08-06 LAB — MYOCARDIAL PERFUSION IMAGING
LV dias vol: 73 mL (ref 46–106)
LV sys vol: 27 mL
Peak HR: 82 {beats}/min
RATE: 0.25
Rest HR: 65 {beats}/min
SDS: 8
SRS: 4
SSS: 12
TID: 1.08

## 2015-08-06 MED ORDER — TECHNETIUM TC 99M SESTAMIBI GENERIC - CARDIOLITE
32.7000 | Freq: Once | INTRAVENOUS | Status: AC | PRN
  Administered 2015-08-06: 32.7 via INTRAVENOUS

## 2015-08-08 ENCOUNTER — Telehealth (HOSPITAL_COMMUNITY): Payer: Self-pay

## 2015-08-08 NOTE — Telephone Encounter (Signed)
Echocardiogram results reviewed with patient. No questions or concerns at this time and reports "feeling great".  Renee Pain

## 2015-08-11 ENCOUNTER — Ambulatory Visit (INDEPENDENT_AMBULATORY_CARE_PROVIDER_SITE_OTHER): Payer: Medicare Other | Admitting: Licensed Clinical Social Worker

## 2015-08-11 DIAGNOSIS — F4322 Adjustment disorder with anxiety: Secondary | ICD-10-CM

## 2015-08-25 ENCOUNTER — Ambulatory Visit (INDEPENDENT_AMBULATORY_CARE_PROVIDER_SITE_OTHER): Payer: Medicare Other | Admitting: Licensed Clinical Social Worker

## 2015-08-25 DIAGNOSIS — F4322 Adjustment disorder with anxiety: Secondary | ICD-10-CM | POA: Diagnosis not present

## 2015-08-29 ENCOUNTER — Other Ambulatory Visit: Payer: Self-pay | Admitting: Cardiology

## 2015-08-31 ENCOUNTER — Other Ambulatory Visit: Payer: Self-pay | Admitting: Cardiology

## 2015-09-10 ENCOUNTER — Ambulatory Visit (INDEPENDENT_AMBULATORY_CARE_PROVIDER_SITE_OTHER): Payer: Medicare Other | Admitting: Licensed Clinical Social Worker

## 2015-09-10 DIAGNOSIS — F4322 Adjustment disorder with anxiety: Secondary | ICD-10-CM | POA: Diagnosis not present

## 2015-09-24 ENCOUNTER — Ambulatory Visit (INDEPENDENT_AMBULATORY_CARE_PROVIDER_SITE_OTHER): Payer: Medicare Other | Admitting: Licensed Clinical Social Worker

## 2015-09-24 ENCOUNTER — Ambulatory Visit (INDEPENDENT_AMBULATORY_CARE_PROVIDER_SITE_OTHER): Payer: Medicare Other | Admitting: Pharmacist

## 2015-09-24 DIAGNOSIS — Z954 Presence of other heart-valve replacement: Secondary | ICD-10-CM | POA: Diagnosis not present

## 2015-09-24 DIAGNOSIS — F4322 Adjustment disorder with anxiety: Secondary | ICD-10-CM | POA: Diagnosis not present

## 2015-09-24 DIAGNOSIS — Z7901 Long term (current) use of anticoagulants: Secondary | ICD-10-CM

## 2015-09-24 DIAGNOSIS — Z952 Presence of prosthetic heart valve: Secondary | ICD-10-CM

## 2015-09-24 LAB — POCT INR: INR: 4.4

## 2015-10-03 ENCOUNTER — Other Ambulatory Visit: Payer: Self-pay | Admitting: Cardiology

## 2015-10-06 ENCOUNTER — Other Ambulatory Visit: Payer: Self-pay

## 2015-10-06 DIAGNOSIS — I739 Peripheral vascular disease, unspecified: Secondary | ICD-10-CM

## 2015-10-06 DIAGNOSIS — I5032 Chronic diastolic (congestive) heart failure: Secondary | ICD-10-CM

## 2015-10-06 DIAGNOSIS — I6523 Occlusion and stenosis of bilateral carotid arteries: Secondary | ICD-10-CM

## 2015-10-06 MED ORDER — ROSUVASTATIN CALCIUM 20 MG PO TABS: ORAL_TABLET | ORAL | Status: DC

## 2015-10-08 ENCOUNTER — Ambulatory Visit: Payer: Medicare Other | Admitting: Licensed Clinical Social Worker

## 2015-10-08 ENCOUNTER — Encounter: Payer: Self-pay | Admitting: Pharmacist Clinician (PhC)/ Clinical Pharmacy Specialist

## 2015-10-13 ENCOUNTER — Ambulatory Visit: Payer: Medicare Other | Admitting: Diagnostic Neuroimaging

## 2015-10-25 ENCOUNTER — Other Ambulatory Visit: Payer: Self-pay | Admitting: Cardiology

## 2015-10-30 ENCOUNTER — Ambulatory Visit (INDEPENDENT_AMBULATORY_CARE_PROVIDER_SITE_OTHER): Payer: Medicare Other | Admitting: Pharmacist Clinician (PhC)/ Clinical Pharmacy Specialist

## 2015-10-30 DIAGNOSIS — Z954 Presence of other heart-valve replacement: Secondary | ICD-10-CM | POA: Diagnosis not present

## 2015-10-30 DIAGNOSIS — Z7901 Long term (current) use of anticoagulants: Secondary | ICD-10-CM | POA: Diagnosis not present

## 2015-10-30 DIAGNOSIS — Z952 Presence of prosthetic heart valve: Secondary | ICD-10-CM

## 2015-10-30 LAB — POCT INR: INR: 3.9

## 2015-11-11 ENCOUNTER — Telehealth: Payer: Self-pay | Admitting: *Deleted

## 2015-11-11 DIAGNOSIS — M25531 Pain in right wrist: Secondary | ICD-10-CM | POA: Diagnosis not present

## 2015-11-11 DIAGNOSIS — M65342 Trigger finger, left ring finger: Secondary | ICD-10-CM | POA: Diagnosis not present

## 2015-11-11 DIAGNOSIS — M654 Radial styloid tenosynovitis [de Quervain]: Secondary | ICD-10-CM | POA: Diagnosis not present

## 2015-11-11 NOTE — Telephone Encounter (Signed)
Spoke with patient. States she saw Dr. Leanora Cover today and is scheduled for hand surgery on July 13. States she will need to be on Lovenox and will be stopping Coumadin. Would like Dr. [name] to be looking out for clearance form.

## 2015-11-13 ENCOUNTER — Ambulatory Visit (INDEPENDENT_AMBULATORY_CARE_PROVIDER_SITE_OTHER): Payer: Medicare Other | Admitting: Pharmacist Clinician (PhC)/ Clinical Pharmacy Specialist

## 2015-11-13 DIAGNOSIS — Z954 Presence of other heart-valve replacement: Secondary | ICD-10-CM

## 2015-11-13 DIAGNOSIS — Z7901 Long term (current) use of anticoagulants: Secondary | ICD-10-CM | POA: Diagnosis not present

## 2015-11-13 DIAGNOSIS — Z952 Presence of prosthetic heart valve: Secondary | ICD-10-CM

## 2015-11-13 LAB — POCT INR: INR: 2.9

## 2015-11-13 NOTE — Patient Instructions (Addendum)
Enoxaprin Dosing Schedule  Enoxparin dose:  Date  Warfarin Dose (evenings) Enoxaprin Dose  7-7 6 3  mg  (1 tab)   7-8 5 0   7-9 4 0 8 am     8 pm  7-10 3 0 8 am     8 pm  7-11 2 0 8 am     8 pm  7-12 1 0 8 am  7-13 Procedure 3 mg (1 tab)   7-14 1 6  mg (2 tabs) 8 am     8 pm  7-15 2 6  mg (2 tabs) 8 am     8 pm  7-16 3 4.5 mg ( 1.5 tabs) 8 am     8 pm  7-16 4 4.5 mg ( 1.5 tabs) 8 am     8 pm  7-17 5 4.5 mg ( 1.5 tabs) 8 am     8 pm  7-18 6 Repeat INR

## 2015-11-14 ENCOUNTER — Other Ambulatory Visit: Payer: Self-pay | Admitting: Orthopedic Surgery

## 2015-11-24 ENCOUNTER — Other Ambulatory Visit: Payer: Self-pay | Admitting: Pharmacist Clinician (PhC)/ Clinical Pharmacy Specialist

## 2015-11-24 MED ORDER — ENOXAPARIN SODIUM 80 MG/0.8ML ~~LOC~~ SOLN: 80.0000 mg | Freq: Two times a day (BID) | SUBCUTANEOUS | Status: DC

## 2015-11-24 NOTE — Telephone Encounter (Signed)
Prescription sent to [facility]

## 2015-11-24 NOTE — Telephone Encounter (Signed)
Pt states Kristen, pharmacist at [facility] has already given her instructions for lovenox bridging for hand surgery.   Pt states she needs a prescription for needles for the lovenox injections.    Pt advised I will forward to [facility] at [place].

## 2015-11-27 ENCOUNTER — Telehealth: Payer: Self-pay | Admitting: Pharmacist

## 2015-11-27 ENCOUNTER — Telehealth: Payer: Self-pay

## 2015-11-27 NOTE — Telephone Encounter (Signed)
Pt called and copay is $400 for lovenox injections. She cannot afford this. She is wanting another option. Explained that there is not currently another medication that can be used in its place. If we are unable to get her on lovenox will need to bridge with heparin since she has a mechanical valve.

## 2015-11-27 NOTE — Telephone Encounter (Signed)
See other encounter same day.

## 2015-11-27 NOTE — Telephone Encounter (Signed)
Patient called in because Lovenox cost to much for patient to pick up from the pharmacy. She stated it cost $400 to pick up medication. She would like a call back to discuss other options. Her call back number is correct in chart.

## 2015-11-27 NOTE — Telephone Encounter (Signed)
Abigail Oliver from Dr. Levell July office returned call, left VM stating that they could do surgery for INR 2.0-2.5.  Will adjust bridge to have patient hold x 3 days prior to procedure and recheck day of or day before.    Spoke with patient, she will hold x 3 days prior to procedure and we will repeat INR Wednesday (day prior).  She will be able to continue warfarin evening of surgery, so will do without lovenox bridge.  Patient voiced understanding.

## 2015-11-27 NOTE — Telephone Encounter (Signed)
Coumadin/Lovenox managed by [facility], will send to them

## 2015-11-27 NOTE — Telephone Encounter (Signed)
LM for Dr. Levell July RN Clarise Cruz) regarding concerns about enoxaparin costs.  Wonder if could do surgery with warfarin or maybe hold fewer days.    Also spoke with patient, she has found a cash price of enoxaparin at $89 at [facility].  Advised her to wait until I speak with Dr. Fredna Dow or his RN regarding length of time off warfarin.

## 2015-12-01 ENCOUNTER — Telehealth (HOSPITAL_COMMUNITY): Payer: Self-pay

## 2015-12-01 ENCOUNTER — Encounter (HOSPITAL_BASED_OUTPATIENT_CLINIC_OR_DEPARTMENT_OTHER): Payer: Self-pay | Admitting: *Deleted

## 2015-12-01 NOTE — Telephone Encounter (Signed)
[name] RN with Dr. Purvis Sheffield and the [facility] calling CHF clinic triage line to get cardiac clearance for mutual patient for a 30 minute surgical procedure.  No anesthesia required, nerve block, will need to hold coumadin day before and day of procedure. [name] to fax over form for clearance. Will get Dr. [name] to complete and return fax today.  Abigail Oliver

## 2015-12-01 NOTE — Progress Notes (Signed)
Per patient, she was told to stop coumadin on 11-30-15 by coumadin clinic,which was OK with Dr Fredna Dow. No bridge required. She is getting INR checked at clinic on 12-03-15 which will be in EPIC. She will also come in for BMET.

## 2015-12-03 ENCOUNTER — Ambulatory Visit (INDEPENDENT_AMBULATORY_CARE_PROVIDER_SITE_OTHER): Payer: Medicare Other | Admitting: Pharmacist

## 2015-12-03 ENCOUNTER — Encounter (HOSPITAL_BASED_OUTPATIENT_CLINIC_OR_DEPARTMENT_OTHER): Payer: Medicare Other

## 2015-12-03 DIAGNOSIS — Z955 Presence of coronary angioplasty implant and graft: Secondary | ICD-10-CM | POA: Diagnosis not present

## 2015-12-03 DIAGNOSIS — Z79899 Other long term (current) drug therapy: Secondary | ICD-10-CM | POA: Diagnosis not present

## 2015-12-03 DIAGNOSIS — M65342 Trigger finger, left ring finger: Secondary | ICD-10-CM | POA: Diagnosis present

## 2015-12-03 DIAGNOSIS — Z952 Presence of prosthetic heart valve: Secondary | ICD-10-CM | POA: Diagnosis not present

## 2015-12-03 DIAGNOSIS — I2510 Atherosclerotic heart disease of native coronary artery without angina pectoris: Secondary | ICD-10-CM | POA: Diagnosis not present

## 2015-12-03 DIAGNOSIS — Z954 Presence of other heart-valve replacement: Secondary | ICD-10-CM | POA: Diagnosis not present

## 2015-12-03 DIAGNOSIS — Z7901 Long term (current) use of anticoagulants: Secondary | ICD-10-CM

## 2015-12-03 DIAGNOSIS — I110 Hypertensive heart disease with heart failure: Secondary | ICD-10-CM | POA: Diagnosis not present

## 2015-12-03 DIAGNOSIS — K219 Gastro-esophageal reflux disease without esophagitis: Secondary | ICD-10-CM | POA: Diagnosis not present

## 2015-12-03 DIAGNOSIS — Z7984 Long term (current) use of oral hypoglycemic drugs: Secondary | ICD-10-CM | POA: Diagnosis not present

## 2015-12-03 DIAGNOSIS — E119 Type 2 diabetes mellitus without complications: Secondary | ICD-10-CM | POA: Diagnosis not present

## 2015-12-03 DIAGNOSIS — I5022 Chronic systolic (congestive) heart failure: Secondary | ICD-10-CM | POA: Diagnosis not present

## 2015-12-03 DIAGNOSIS — E785 Hyperlipidemia, unspecified: Secondary | ICD-10-CM | POA: Diagnosis not present

## 2015-12-03 DIAGNOSIS — M65312 Trigger thumb, left thumb: Secondary | ICD-10-CM | POA: Diagnosis not present

## 2015-12-03 DIAGNOSIS — Z7982 Long term (current) use of aspirin: Secondary | ICD-10-CM | POA: Diagnosis not present

## 2015-12-03 LAB — POCT INR: INR: 1.2

## 2015-12-03 LAB — BASIC METABOLIC PANEL
Anion gap: 9 (ref 5–15)
BUN: 20 mg/dL (ref 6–20)
CO2: 26 mmol/L (ref 22–32)
Calcium: 9.7 mg/dL (ref 8.9–10.3)
Chloride: 102 mmol/L (ref 101–111)
Creatinine, Ser: 1.31 mg/dL — ABNORMAL HIGH (ref 0.44–1.00)
GFR calc Af Amer: 47 mL/min — ABNORMAL LOW (ref >60)
GFR calc non Af Amer: 40 mL/min — ABNORMAL LOW (ref >60)
Glucose, Bld: 131 mg/dL — ABNORMAL HIGH (ref 65–99)
Potassium: 4.7 mmol/L (ref 3.5–5.1)
Sodium: 137 mmol/L (ref 135–145)

## 2015-12-04 ENCOUNTER — Ambulatory Visit (HOSPITAL_BASED_OUTPATIENT_CLINIC_OR_DEPARTMENT_OTHER)
Admission: RE | Admit: 2015-12-04 | Discharge: 2015-12-04 | Disposition: A | Discharge status: Home / Self Care | Payer: Medicare Other | Source: Ambulatory Visit | Attending: Orthopedic Surgery | Admitting: Orthopedic Surgery

## 2015-12-04 ENCOUNTER — Encounter (HOSPITAL_BASED_OUTPATIENT_CLINIC_OR_DEPARTMENT_OTHER)
Admission: RE | Disposition: A | Discharge status: Home / Self Care | Payer: Self-pay | Source: Ambulatory Visit | Attending: Orthopedic Surgery

## 2015-12-04 ENCOUNTER — Ambulatory Visit (HOSPITAL_BASED_OUTPATIENT_CLINIC_OR_DEPARTMENT_OTHER): Payer: Medicare Other | Admitting: Anesthesiology

## 2015-12-04 ENCOUNTER — Encounter (HOSPITAL_BASED_OUTPATIENT_CLINIC_OR_DEPARTMENT_OTHER): Payer: Self-pay | Admitting: *Deleted

## 2015-12-04 DIAGNOSIS — I110 Hypertensive heart disease with heart failure: Secondary | ICD-10-CM | POA: Diagnosis not present

## 2015-12-04 DIAGNOSIS — K219 Gastro-esophageal reflux disease without esophagitis: Secondary | ICD-10-CM | POA: Insufficient documentation

## 2015-12-04 DIAGNOSIS — M65312 Trigger thumb, left thumb: Secondary | ICD-10-CM | POA: Diagnosis not present

## 2015-12-04 DIAGNOSIS — E119 Type 2 diabetes mellitus without complications: Secondary | ICD-10-CM | POA: Insufficient documentation

## 2015-12-04 DIAGNOSIS — Z952 Presence of prosthetic heart valve: Secondary | ICD-10-CM | POA: Diagnosis not present

## 2015-12-04 DIAGNOSIS — M65342 Trigger finger, left ring finger: Secondary | ICD-10-CM | POA: Diagnosis not present

## 2015-12-04 DIAGNOSIS — Z7901 Long term (current) use of anticoagulants: Secondary | ICD-10-CM | POA: Insufficient documentation

## 2015-12-04 DIAGNOSIS — I25119 Atherosclerotic heart disease of native coronary artery with unspecified angina pectoris: Secondary | ICD-10-CM | POA: Diagnosis not present

## 2015-12-04 DIAGNOSIS — Z955 Presence of coronary angioplasty implant and graft: Secondary | ICD-10-CM | POA: Diagnosis not present

## 2015-12-04 DIAGNOSIS — I5022 Chronic systolic (congestive) heart failure: Secondary | ICD-10-CM | POA: Insufficient documentation

## 2015-12-04 DIAGNOSIS — I10 Essential (primary) hypertension: Secondary | ICD-10-CM | POA: Diagnosis not present

## 2015-12-04 DIAGNOSIS — Z79899 Other long term (current) drug therapy: Secondary | ICD-10-CM | POA: Diagnosis not present

## 2015-12-04 DIAGNOSIS — Z7982 Long term (current) use of aspirin: Secondary | ICD-10-CM | POA: Insufficient documentation

## 2015-12-04 DIAGNOSIS — E785 Hyperlipidemia, unspecified: Secondary | ICD-10-CM | POA: Insufficient documentation

## 2015-12-04 DIAGNOSIS — Z7984 Long term (current) use of oral hypoglycemic drugs: Secondary | ICD-10-CM | POA: Insufficient documentation

## 2015-12-04 DIAGNOSIS — I2510 Atherosclerotic heart disease of native coronary artery without angina pectoris: Secondary | ICD-10-CM | POA: Insufficient documentation

## 2015-12-04 HISTORY — PX: TRIGGER FINGER RELEASE: SHX641

## 2015-12-04 HISTORY — DX: Anxiety disorder, unspecified: F41.9

## 2015-12-04 LAB — GLUCOSE, CAPILLARY
Glucose-Capillary: 135 mg/dL — ABNORMAL HIGH (ref 65–99)
Glucose-Capillary: 104 mg/dL — ABNORMAL HIGH (ref 65–99)

## 2015-12-04 SURGERY — RELEASE, A1 PULLEY, FOR TRIGGER FINGER
Anesthesia: Monitor Anesthesia Care | Site: Finger | Laterality: Left

## 2015-12-04 MED ORDER — ONDANSETRON HCL 4 MG/2ML IJ SOLN
INTRAMUSCULAR | Status: AC
  Filled 2015-12-04: qty 2

## 2015-12-04 MED ORDER — MIDAZOLAM HCL 2 MG/2ML IJ SOLN
1.0000 mg | INTRAMUSCULAR | Status: DC | PRN
  Administered 2015-12-04: 1 mg via INTRAVENOUS

## 2015-12-04 MED ORDER — GLYCOPYRROLATE 0.2 MG/ML IJ SOLN: 0.2000 mg | Freq: Once | INTRAMUSCULAR | Status: DC | PRN

## 2015-12-04 MED ORDER — BUPIVACAINE HCL (PF) 0.25 % IJ SOLN
INTRAMUSCULAR | Status: DC | PRN
  Administered 2015-12-04: 6 mL

## 2015-12-04 MED ORDER — BUPIVACAINE HCL (PF) 0.25 % IJ SOLN
INTRAMUSCULAR | Status: AC
  Filled 2015-12-04: qty 30

## 2015-12-04 MED ORDER — OXYCODONE HCL 5 MG PO TABS: 5.0000 mg | ORAL_TABLET | Freq: Once | ORAL | Status: DC | PRN

## 2015-12-04 MED ORDER — CEFAZOLIN SODIUM-DEXTROSE 2-4 GM/100ML-% IV SOLN
INTRAVENOUS | Status: AC
  Filled 2015-12-04: qty 100

## 2015-12-04 MED ORDER — LIDOCAINE HCL (CARDIAC) 20 MG/ML IV SOLN
INTRAVENOUS | Status: DC | PRN
  Administered 2015-12-04: 50 mg via INTRAVENOUS

## 2015-12-04 MED ORDER — PROPOFOL 500 MG/50ML IV EMUL
INTRAVENOUS | Status: DC | PRN
  Administered 2015-12-04: 100 ug/kg/min via INTRAVENOUS

## 2015-12-04 MED ORDER — ONDANSETRON HCL 4 MG/2ML IJ SOLN
INTRAMUSCULAR | Status: DC | PRN
  Administered 2015-12-04: 4 mg via INTRAVENOUS

## 2015-12-04 MED ORDER — MIDAZOLAM HCL 2 MG/2ML IJ SOLN
INTRAMUSCULAR | Status: AC
  Filled 2015-12-04: qty 2

## 2015-12-04 MED ORDER — CHLORHEXIDINE GLUCONATE 4 % EX LIQD: 60.0000 mL | Freq: Once | CUTANEOUS | Status: DC

## 2015-12-04 MED ORDER — LACTATED RINGERS IV SOLN
INTRAVENOUS | Status: DC
  Administered 2015-12-04: 08:00:00 via INTRAVENOUS

## 2015-12-04 MED ORDER — FENTANYL CITRATE (PF) 100 MCG/2ML IJ SOLN: 25.0000 ug | INTRAMUSCULAR | Status: DC | PRN

## 2015-12-04 MED ORDER — HYDROCODONE-ACETAMINOPHEN 5-325 MG PO TABS: ORAL_TABLET | ORAL | Status: DC

## 2015-12-04 MED ORDER — SCOPOLAMINE 1 MG/3DAYS TD PT72: 1.0000 | MEDICATED_PATCH | Freq: Once | TRANSDERMAL | Status: DC | PRN

## 2015-12-04 MED ORDER — ONDANSETRON HCL 4 MG/2ML IJ SOLN: 4.0000 mg | Freq: Four times a day (QID) | INTRAMUSCULAR | Status: DC | PRN

## 2015-12-04 MED ORDER — FENTANYL CITRATE (PF) 100 MCG/2ML IJ SOLN
50.0000 ug | INTRAMUSCULAR | Status: DC | PRN
  Administered 2015-12-04: 50 ug via INTRAVENOUS

## 2015-12-04 MED ORDER — FENTANYL CITRATE (PF) 100 MCG/2ML IJ SOLN
INTRAMUSCULAR | Status: AC
  Filled 2015-12-04: qty 2

## 2015-12-04 MED ORDER — CEFAZOLIN SODIUM-DEXTROSE 2-4 GM/100ML-% IV SOLN
2.0000 g | INTRAVENOUS | Status: AC
  Administered 2015-12-04: 2 g via INTRAVENOUS

## 2015-12-04 MED ORDER — OXYCODONE HCL 5 MG/5ML PO SOLN: 5.0000 mg | Freq: Once | ORAL | Status: DC | PRN

## 2015-12-04 SURGICAL SUPPLY — 37 items
BANDAGE COBAN STERILE 2 (GAUZE/BANDAGES/DRESSINGS) ×2 IMPLANT
BLADE MINI RND TIP GREEN BEAV (BLADE) IMPLANT
BLADE SURG 15 STRL LF DISP TIS (BLADE) ×2 IMPLANT
BLADE SURG 15 STRL SS (BLADE) ×4
BNDG CMPR 9X4 STRL LF SNTH (GAUZE/BANDAGES/DRESSINGS)
BNDG CONFORM 2 STRL LF (GAUZE/BANDAGES/DRESSINGS) ×2 IMPLANT
BNDG ESMARK 4X9 LF (GAUZE/BANDAGES/DRESSINGS) IMPLANT
CHLORAPREP W/TINT 26ML (MISCELLANEOUS) ×2 IMPLANT
CORDS BIPOLAR (ELECTRODE) ×2 IMPLANT
COVER BACK TABLE 60X90IN (DRAPES) ×2 IMPLANT
COVER MAYO STAND STRL (DRAPES) ×2 IMPLANT
CUFF TOURNIQUET SINGLE 18IN (TOURNIQUET CUFF) ×2 IMPLANT
DRAPE EXTREMITY T 121X128X90 (DRAPE) ×2 IMPLANT
DRAPE SURG 17X23 STRL (DRAPES) ×2 IMPLANT
GAUZE SPONGE 4X4 12PLY STRL (GAUZE/BANDAGES/DRESSINGS) ×2 IMPLANT
GAUZE XEROFORM 1X8 LF (GAUZE/BANDAGES/DRESSINGS) ×2 IMPLANT
GLOVE BIO SURGEON STRL SZ 6.5 (GLOVE) ×1 IMPLANT
GLOVE BIO SURGEON STRL SZ7.5 (GLOVE) ×2 IMPLANT
GLOVE BIOGEL PI IND STRL 7.0 (GLOVE) IMPLANT
GLOVE BIOGEL PI IND STRL 8 (GLOVE) ×1 IMPLANT
GLOVE BIOGEL PI INDICATOR 7.0 (GLOVE) ×1
GLOVE BIOGEL PI INDICATOR 8 (GLOVE) ×1
GOWN STRL REIN XL XLG (GOWN DISPOSABLE) ×2 IMPLANT
GOWN STRL REUS W/ TWL LRG LVL3 (GOWN DISPOSABLE) ×1 IMPLANT
GOWN STRL REUS W/TWL LRG LVL3 (GOWN DISPOSABLE) ×2
NDL HYPO 25X1 1.5 SAFETY (NEEDLE) IMPLANT
NEEDLE HYPO 25X1 1.5 SAFETY (NEEDLE) IMPLANT
NS IRRIG 1000ML POUR BTL (IV SOLUTION) ×2 IMPLANT
PACK BASIN DAY SURGERY FS (CUSTOM PROCEDURE TRAY) ×2 IMPLANT
PADDING CAST ABS 4INX4YD NS (CAST SUPPLIES) ×1
PADDING CAST ABS COTTON 4X4 ST (CAST SUPPLIES) ×1 IMPLANT
STOCKINETTE 4X48 STRL (DRAPES) ×2 IMPLANT
SUT ETHILON 4 0 PS 2 18 (SUTURE) ×2 IMPLANT
SYR BULB 3OZ (MISCELLANEOUS) ×2 IMPLANT
SYR CONTROL 10ML LL (SYRINGE) IMPLANT
TOWEL OR 17X24 6PK STRL BLUE (TOWEL DISPOSABLE) ×4 IMPLANT
UNDERPAD 30X30 (UNDERPADS AND DIAPERS) ×2 IMPLANT

## 2015-12-04 NOTE — Anesthesia Postprocedure Evaluation (Signed)
Anesthesia Post Note  Patient: Abigail Oliver  Procedure(s) Performed: Procedure(s) (LRB): LEFT RING FINGER TRIGGER RELEASE (Left)  Patient location during evaluation: PACU Anesthesia Type: MAC and Bier Block Level of consciousness: awake and alert Pain management: pain level controlled Vital Signs Assessment: post-procedure vital signs reviewed and stable Respiratory status: spontaneous breathing, nonlabored ventilation, respiratory function stable and patient connected to nasal cannula oxygen Cardiovascular status: stable and blood pressure returned to baseline Anesthetic complications: no    Last Vitals:  Filed Vitals:   12/04/15 0931 12/04/15 0955  BP:    Pulse:  66  Temp:  36.9 C  Resp: 18 16    Last Pain:  Filed Vitals:   12/04/15 1013  PainSc: 0-No pain                 Atziry Baranski S

## 2015-12-04 NOTE — H&P (Signed)
Abigail Oliver is an 70 y.o. female.   Chief Complaint: left ring finger trigger HPI: 70 yo female with triggering of left ring finger.  It is bothersome to her and she wishes to have it released.    Allergies:  Allergies  Allergen Reactions  . Atorvastatin     Muscle pain  . Simvastatin Other (See Comments)    Muscle pain  . Sulfa Antibiotics     unknown  . Iodinated Diagnostic Agents Rash     Red rash after cardiac cath 1 wk ago, ? Contrast allergy, requires 13 hr prep now per [email or web address]    Past Medical History  Diagnosis Date  . Coronary atherosclerosis of native coronary artery     a. CABG at time of AVR in 6/00 with RIMA-RCA.b. Abnl nuc 11/2013 -> LHC (7/15) with patent RIMA-RCA, 80% mRCA, 80% pLAD with FFR 0.73, treated with DES to pLAD.  Marland Kitchen Hyperlipidemia   . Chronic angle-closure glaucoma(365.23)   . Aortic stenosis     a. Now has St Jude mechanical aortic valve (6/00).b. Echo (6/15) with EF 60-65%, mechanical aortic valve with mean gradient 27 mmHg.  Marland Kitchen Peripheral vascular disease ([place])     a, H/o Right SFA stent. b. Peripheral arterial dopplers (7/16) with right SFA stent patent.   . Type II diabetes mellitus ([place])   . Depression   . PONV (postoperative nausea and vomiting)     N&V  . Chronic diastolic CHF (congestive heart failure) ([place])     a.  RHC (7/15) with mean RA 2, PA 22/11, mean PCWP 9, CI 3.79.  Marland Kitchen GERD (gastroesophageal reflux disease)   . Neuromuscular disorder (H[facility])     neuropathy  . Arthritis     a. Possible c-spine arthritis with pain down left arm.   . Anemia   . Syncope 03/29/2012    carotid doppler - R ICA 50-69% reduction by velocities (low end); L ICA 0-49% reduction by velocities (low end); R and L subclavian arteries - <50% redcution; R and L vertebral arteries show normal antegrade flow  . Dizziness 02/09/2012    a. Holter (8/14) with few PACs, otherwise unremarkable.   . Essential hypertension   . Colon polyps   . Carotid  artery disease (H[facility])     a. Carotid dopplers (7/16) with 40-59% BICA stenosis.   . Gastritis   . Elevated transaminase level   . Pulmonary nodules     a. Noted by CT in 6/15. CT chest (8/15) was thought to indicate that nodules were benign. No further workup was recommended.   . Contrast media allergy   . Low back pain   . Mechanical heart valve present   . Anxiety     Past Surgical History  Procedure Laterality Date  . Carpal tunnel release  1989    bilaterally  . Cholecystectomy  1990  . Peripheral arterial stent graft  09/20/11    right SFA  . Cardiac valve replacement  2000    aortic valve   . Laceration repair      right hand  . Neuroplasty / transposition ulnar nerve at elbow      right  . Tonsillectomy  1968  . Vaginal hysterectomy  1985    Fibroids  . Bilateral oophorectomy  1987  . Cesarean section  1969; 1971  . Breast biopsy  2012    left  . Lymph node biopsy  06/2011    "core needle on 5"  . Cataract extraction  w/ intraocular lens  implant, bilateral  ~ 2007  . Refractive surgery  ` 2004    "for glaucoma"  . Needle biopsy  2009    "on ankles for nerve damage"  . Esophagogastroduodenoscopy N/A 10/27/2012    Procedure: ESOPHAGOGASTRODUODENOSCOPY (EGD);  Surgeon: Theda Belfast, MD;  Location: [facility];  Service: Endoscopy;  Laterality: N/A;  . Colonoscopy N/A 10/27/2012    Procedure: COLONOSCOPY;  Surgeon: Theda Belfast, MD;  Location: [facility] ENDOSCOPY;  Service: Endoscopy;  Laterality: N/A;  . Doppler echocardiography  03/29/2012    EF >55%; mild concentric LVH; stage 1 diastolic dysfunction, elevated LV filling pressure, dilated LA; MAC mild MR; St Jude AVR peak and mean gradients of and ; transvalvular gradients have increased (prev 23 and 14 respectively)  . Cardiovascular stress test  08/25/2011    R/L MV - EF 72%; no scintigraphic evidence of inducible MI; normal perfusionTID of 1.25 elevated - could indicate small vessle subendocardial ischemia;  EKG NSR at 66, non diagnostic for ischemia  . Cardiac catheterization  01/14/1999    normal LV function, severe aortic stenosis; 80% and 70% stenosis in RCA; mild 20% distal norrowing in L main with 20% proximal LAD stenosis, 40% diagonal stenosis and 20% proximal circumflex stenosis  . Femoral artery stent  09/20/2011    6 x 40 Smart Nitinol self-expanding stent placed;  10/15/2031 -R SFA stent open and patent w/o evidence of restenosis  . Coronary artery bypass graft  2000    RIMA to RCA  . Lower extremity angiogram N/A 09/20/2011    Procedure: LOWER EXTREMITY ANGIOGRAM;  Surgeon: Runell Gess, MD;  Location[facility] CATH LAB;  Service: Cardiovascular;  Laterality: N/A;  . Left and right heart catheterization with coronary angiogram N/A 12/14/2013    Procedure: LEFT AND RIGHT HEART CATHETERIZATION WITH CORONARY ANGIOGRAM;  Surgeon: Laurey Morale, MD;  Location[facility] CATH LAB;  Service: Cardiovascular;  Laterality: N/A;  . Fractional flow reserve wire  12/14/2013    Procedure: FRACTIONAL FLOW RESERVE WIRE;  Surgeon: Laurey Morale, MD;  Location: [facility] CATH LAB;  Service: Cardiovascular;;  . Percutaneous coronary stent intervention (pci-s)  12/14/2013    Procedure: PERCUTANEOUS CORONARY STENT INTERVENTION (PCI-S);  Surgeon: Laurey Morale, MD;  Location[facility] C[facility] LAB;  Service: Cardiovascular;;  Prox LAD 3.00x12 Promus DES     Family History: Family History  Problem Relation Age of Onset  . Colon cancer Mother   . COPD Mother   . Emphysema Mother   . Diabetes Neg Hx   . Cancer Maternal Grandmother   . Cancer Maternal Grandfather   . Heart disease Brother     Social History:   reports that she has never smoked. She has never used smokeless tobacco. She reports that she does not drink alcohol or use illicit drugs.  Medications: Medications Prior to Admission  Medication Sig Dispense Refill  . Ascorbic Acid (VITAMIN C) 500 MG tablet Take 500 mg by mouth 2 (two) times daily.     Marland Kitchen aspirin EC  81 MG tablet Take 1 tablet (81 mg total) by mouth daily.    . calcium-vitamin D (CALCIUM 500+D) 500-200 MG-UNIT per tablet Take 1 tablet by mouth 2 (two) times daily.     . citalopram (CELEXA) 20 MG tablet Take 1 tablet (20 mg total) by mouth at bedtime. 30 tablet 5  . ferrous sulfate 325 (65 FE) MG tablet Take 1 tablet (325 mg total) by mouth daily. 30 tablet  0  . folic acid (FOLVITE) 1 MG tablet Take 1 mg by mouth daily.    Marland Kitchen latanoprost (XALATAN) 0.005 % ophthalmic solution PLACE 1 DROP INTO BOTH EYES AT BEDTIME. 2.5 mL 2  . metFORMIN (GLUCOPHAGE) 1000 MG tablet TAKE 1 TABLET (1,000 MG TOTAL) BY MOUTH 2 (TWO) TIMES DAILY WITH A MEAL. 60 tablet 11  . metoprolol succinate (TOPROL-XL) 50 MG 24 hr tablet TAKE 1 TABLET (50 MG TOTAL) BY MOUTH DAILY. 30 tablet 11  . pantoprazole (PROTONIX) 40 MG tablet TAKE 1 TABLET (40 MG TOTAL) BY MOUTH DAILY. 90 tablet 2  . potassium chloride SA (K-DUR,KLOR-CON) 20 MEQ tablet Take 40-60 mEq by mouth as directed. Take 3 tablets (60 meq) in the AM and 2 tablets (40 meq) in the PM    . rosuvastatin (CRESTOR) 20 MG tablet Take 1 tablet by mouth twice weekly. 90 tablet 0  . torsemide (DEMADEX) 20 MG tablet TAKE 3 TABLETS (60 MG) BY MOUTH EVERY MORNING AND 2 TABLETS (40 MG) BY MOUTH EVERY EVENING. 150 tablet 2  . traZODone (DESYREL) 50 MG tablet TAKE 0.5-1 TABLETS (25-50 MG TOTAL) BY MOUTH AT BEDTIME AS NEEDED FOR SLEEP. 30 tablet 2  . nitroGLYCERIN (NITROSTAT) 0.4 MG SL tablet Place 1 tablet (0.4 mg total) under the tongue every 5 (five) minutes as needed for chest pain. (Patient not taking: Reported on 07/23/2015) 25 tablet 3  . warfarin (COUMADIN) 3 MG tablet Take 1-1.5 tablets by mouth daily as directed by coumadin clinic, need INR for further refills 40 tablet 0    Results for orders placed or performed during the hospital encounter of 12/04/15 (from the past 48 hour(s))  Basic metabolic panel     Status: Abnormal   Collection Time: 12/03/15  9:31 AM  Result Value  Ref Range   Sodium 137 135 - 145 mmol/L   Potassium 4.7 3.5 - 5.1 mmol/L   Chloride 102 101 - 111 mmol/L   CO2 26 22 - 32 mmol/L   Glucose, Bld 131 (H) 65 - 99 mg/dL   BUN 20 6 - 20 mg/dL   Creatinine, Ser 8.45 (H) 0.44 - 1.00 mg/dL   Calcium 9.7 8.9 - 39.0 mg/dL   GFR calc non Af Amer 40 (L) >60 mL/min   GFR calc Af Amer 47 (L) >60 mL/min    Comment: (NOTE) The eGFR has been calculated using the CKD EPI equation. This calculation has not been validated in all clinical situations. eGFR's persistently <60 mL/min signify possible Chronic Kidney Disease.    Anion gap 9 5 - 15  Glucose, capillary     Status: Abnormal   Collection Time: 12/04/15  7:45 AM  Result Value Ref Range   Glucose-Capillary 135 (H) 65 - 99 mg/dL    No results found.   A comprehensive review of systems was negative except for: Hematologic/lymphatic: positive for easy bruising  Blood pressure 137/50, pulse 65, temperature 98.1 F (36.7 C), temperature source Oral, resp. rate 18, height 5\' 2"  (1.575 m), weight 84.369 kg (186 lb), SpO2 99 %.  General appearance: alert, cooperative and appears stated age Head: Normocephalic, without obvious abnormality, atraumatic Neck: supple, symmetrical, trachea midline Resp: clear to auscultation bilaterally Cardio: regular rate and rhythm GI: non-tender Extremities: Intact sensation and capillary refill all digits.  +epl/fpl/io.  No wounds.  Pulses: 2+ and symmetric Skin: Skin color, texture, turgor normal. No rashes or lesions Neurologic: Grossly normal Incision/Wound:none  Assessment/Plan Left ring finger trigger digit.  Non operative and  operative treatment options were discussed with the patient and patient wishes to proceed with operative treatment. Risks, benefits, and alternatives of surgery were discussed and the patient agrees with the plan of care.   Lorelie Biermann R 12/04/2015, 8:33 AM

## 2015-12-04 NOTE — Anesthesia Preprocedure Evaluation (Signed)
Anesthesia Evaluation  Patient identified by MRN, date of birth, ID band Patient awake    Reviewed: Allergy & Precautions, NPO status , Patient's Chart, lab work & pertinent test results  History of Anesthesia Complications (+) PONV  Airway Mallampati: II   Neck ROM: full    Dental   Pulmonary    breath sounds clear to auscultation       Cardiovascular hypertension, + angina + CAD, + Cardiac Stents, + CABG, + Peripheral Vascular Disease and +CHF  + Valvular Problems/Murmurs AS  Rhythm:regular Rate:Normal  S/p AVR.  Dr [name] is cardiologist.  Recent stress test March 2017 was normal.     Neuro/Psych    GI/Hepatic GERD  ,  Endo/Other  diabetes, Type 2obese  Renal/GU      Musculoskeletal   Abdominal   Peds  Hematology   Anesthesia Other Findings   Reproductive/Obstetrics                             Anesthesia Physical Anesthesia Plan  ASA: IV  Anesthesia Plan: MAC and Bier Block   Post-op Pain Management:    Induction: Intravenous  Airway Management Planned: Simple Face Mask  Additional Equipment:   Intra-op Plan:   Post-operative Plan:   Informed Consent: I have reviewed the patients History and Physical, chart, labs and discussed the procedure including the risks, benefits and alternatives for the proposed anesthesia with the patient or authorized representative who has indicated his/her understanding and acceptance.     Plan Discussed with: CRNA, Anesthesiologist and Surgeon  Anesthesia Plan Comments:         Anesthesia Quick Evaluation

## 2015-12-04 NOTE — Op Note (Signed)
[id number] 

## 2015-12-04 NOTE — Op Note (Signed)
Abigail Oliver, Abigail Oliver NO.:  [id number]  MEDICAL RECORD NO.:  [id number]  LOCATION:                                 FACILITY:  PHYSICIAN:  Leanora Cover, MD        DATE OF BIRTH:  1945-07-08  DATE OF PROCEDURE: DATE OF DISCHARGE:                              OPERATIVE REPORT   PREOPERATIVE DIAGNOSIS:  Left ring finger trigger digit.  POSTOPERATIVE DIAGNOSIS:  Left ring finger trigger digit.  PROCEDURE:  Trigger release left ring finger.  SURGEON:  Leanora Cover, MD.  ASSISTANT:  None.  ANESTHESIA:  Bier block with sedation.  IV FLUIDS:  Per anesthesia flow sheet.  ESTIMATED BLOOD LOSS:  Minimal.  COMPLICATIONS:  None.  SPECIMENS:  None.  TOURNIQUET TIME:  18 minutes.  DISPOSITION:  Stable to PACU.  INDICATIONS:  Abigail Oliver is a 70 year old female who has been having triggering of the left ring finger.  It is bothersome to her.  She wished to have it released.  Risks, benefits, and alternatives of surgery were discussed including risk of blood loss; infection; damage to nerves, vessels, tendons, ligaments, bone; failure of surgery; need for additional surgery; complications with wound healing; continued pain; recurrence of triggering.  She voiced understanding of risks and elected to proceed.  OPERATIVE COURSE:  After being identified preoperatively by myself, the patient and I agreed upon procedure and site of procedure.  Surgical site was marked.  The risks, benefits, and alternatives of surgery were reviewed and she wished to proceed.  Surgical consent had been signed. She was given IV Ancef as preoperative antibiotic prophylaxis.  She was transferred to the operating room and placed on the operating room table in supine position with left upper extremity on arm board.  Bier block anesthesia induced by Anesthesiology.  Left upper extremity was prepped and draped in normal sterile orthopedic fashion.  Surgical pause was performed between  surgeons, anesthesia, and operating staff and all were in agreement as to the patient, procedure, and site procedure. Tourniquet at the proximal aspect of the forearm had been inflated for the Bier block.  Incision was made at the volar aspect of the MP of the ring finger.  This was carried subcutaneous tissues by spreading technique.  Bipolar electrocautery was used to obtain hemostasis.  The A1 pulley was sharply incised.  The proximal 1-2 mm of the A2 pulley was vented to allow better excursion of the tendons.  There was an A0 pulley, which was released as well.  The tendons were brought through the wound and separated.  The finger was placed through range of motion. There was no recurrence of triggering.  The wound was copiously irrigated with sterile saline.  It was closed with 4-0 nylon in a horizontal mattress fashion.  It was injected with 5 mL of core of 0.25% plain Marcaine to aid in postoperative analgesia.  It was then dressed with sterile Xeroform, 4x4s, and wrapped with a Coban dressing lightly. Tourniquet was deflated at 18 minutes.  The fingertips were pink with brisk capillary refill after deflation of tourniquet.  Operative drapes were broken down.  The patient was awoken from anesthesia safely.  She was  transferred back to stretcher and taken to PACU in stable condition. I will see her back in the office in a week for postoperative followup. I will give her Norco 5/325, 1-2 p.o. q.6 hours p.r.n. pain, dispensed #20.     Leanora Cover, MD   ______________________________ Leanora Cover, MD    KK/MEDQ  D:  12/04/2015  T:  12/04/2015  Job:  [id number]

## 2015-12-04 NOTE — Transfer of Care (Signed)
Immediate Anesthesia Transfer of Care Note  Patient: Abigail Oliver  Procedure(s) Performed: Procedure(s): LEFT RING FINGER TRIGGER RELEASE (Left)  Patient Location: PACU  Anesthesia Type:Bier block  Level of Consciousness: awake and patient cooperative  Airway & Oxygen Therapy: Patient Spontanous Breathing and Patient connected to face mask oxygen  Post-op Assessment: Report given to RN and Post -op Vital signs reviewed and stable  Post vital signs: Reviewed and stable  Last Vitals:  Filed Vitals:   12/04/15 0737  BP: 137/50  Pulse: 65  Temp: 36.7 C  Resp: 18    Last Pain:  Filed Vitals:   12/04/15 0743  PainSc: 10-Worst pain ever      Patients Stated Pain Goal: 5 ([id number])  Complications: No apparent anesthesia complications

## 2015-12-04 NOTE — Discharge Instructions (Addendum)

## 2015-12-04 NOTE — Brief Op Note (Signed)
12/04/2015  9:13 AM  PATIENT:  Abigail Oliver  70 y.o. female  PRE-OPERATIVE DIAGNOSIS:  LEFT RING FINGER TRIGGER DIGIT  POST-OPERATIVE DIAGNOSIS:  LEFT RING FINGER TRIGGER DIGIT  PROCEDURE:  Procedure(s): LEFT RING FINGER TRIGGER RELEASE (Left)  SURGEON:  Surgeon(s) and Role:    * Leanora Cover, MD - Primary  PHYSICIAN ASSISTANT:   ASSISTANTS: none   ANESTHESIA:   Bier block with sedation  EBL:  Total I/O In: -  Out: 5 [Blood:5]  BLOOD ADMINISTERED:none  DRAINS: none   LOCAL MEDICATIONS USED:  MARCAINE     SPECIMEN:  No Specimen  DISPOSITION OF SPECIMEN:  N/A  COUNTS:  YES  TOURNIQUET:   Total Tourniquet Time Documented: Forearm (Left) - 18 minutes Total: Forearm (Left) - 18 minutes   DICTATION: .Other Dictation: Dictation Number [id number]  PLAN OF CARE: Discharge to home after PACU  PATIENT DISPOSITION:  PACU - hemodynamically stable.

## 2015-12-07 ENCOUNTER — Encounter (HOSPITAL_BASED_OUTPATIENT_CLINIC_OR_DEPARTMENT_OTHER): Payer: Self-pay | Admitting: Orthopedic Surgery

## 2015-12-08 ENCOUNTER — Other Ambulatory Visit: Payer: Self-pay | Admitting: Cardiology

## 2015-12-08 DIAGNOSIS — I739 Peripheral vascular disease, unspecified: Secondary | ICD-10-CM

## 2015-12-09 ENCOUNTER — Other Ambulatory Visit: Payer: Self-pay | Admitting: Cardiology

## 2015-12-09 ENCOUNTER — Ambulatory Visit (INDEPENDENT_AMBULATORY_CARE_PROVIDER_SITE_OTHER): Payer: Medicare Other | Admitting: Pharmacist

## 2015-12-09 ENCOUNTER — Other Ambulatory Visit: Payer: Self-pay | Admitting: Internal Medicine

## 2015-12-09 DIAGNOSIS — Z954 Presence of other heart-valve replacement: Secondary | ICD-10-CM | POA: Diagnosis not present

## 2015-12-09 DIAGNOSIS — Z952 Presence of prosthetic heart valve: Secondary | ICD-10-CM

## 2015-12-09 DIAGNOSIS — Z7901 Long term (current) use of anticoagulants: Secondary | ICD-10-CM

## 2015-12-09 LAB — POCT INR: INR: 2.4

## 2015-12-16 ENCOUNTER — Other Ambulatory Visit: Payer: Self-pay | Admitting: Cardiovascular Disease

## 2015-12-16 ENCOUNTER — Other Ambulatory Visit (HOSPITAL_COMMUNITY): Payer: Self-pay | Admitting: Physician Assistant

## 2015-12-17 ENCOUNTER — Ambulatory Visit (HOSPITAL_COMMUNITY)
Admission: RE | Admit: 2015-12-17 | Discharge: 2015-12-17 | Disposition: A | Discharge status: Home / Self Care | Payer: Medicare Other | Source: Ambulatory Visit | Attending: Cardiovascular Disease | Admitting: Cardiovascular Disease

## 2015-12-17 DIAGNOSIS — K219 Gastro-esophageal reflux disease without esophagitis: Secondary | ICD-10-CM | POA: Insufficient documentation

## 2015-12-17 DIAGNOSIS — I110 Hypertensive heart disease with heart failure: Secondary | ICD-10-CM | POA: Insufficient documentation

## 2015-12-17 DIAGNOSIS — I739 Peripheral vascular disease, unspecified: Secondary | ICD-10-CM | POA: Insufficient documentation

## 2015-12-17 DIAGNOSIS — I2510 Atherosclerotic heart disease of native coronary artery without angina pectoris: Secondary | ICD-10-CM | POA: Diagnosis not present

## 2015-12-17 DIAGNOSIS — F419 Anxiety disorder, unspecified: Secondary | ICD-10-CM | POA: Insufficient documentation

## 2015-12-17 DIAGNOSIS — F329 Major depressive disorder, single episode, unspecified: Secondary | ICD-10-CM | POA: Insufficient documentation

## 2015-12-17 DIAGNOSIS — I5032 Chronic diastolic (congestive) heart failure: Secondary | ICD-10-CM | POA: Diagnosis not present

## 2015-12-17 DIAGNOSIS — E119 Type 2 diabetes mellitus without complications: Secondary | ICD-10-CM | POA: Insufficient documentation

## 2015-12-17 DIAGNOSIS — M654 Radial styloid tenosynovitis [de Quervain]: Secondary | ICD-10-CM | POA: Diagnosis not present

## 2015-12-17 DIAGNOSIS — E785 Hyperlipidemia, unspecified: Secondary | ICD-10-CM | POA: Diagnosis not present

## 2015-12-17 DIAGNOSIS — M65342 Trigger finger, left ring finger: Secondary | ICD-10-CM | POA: Diagnosis not present

## 2015-12-20 ENCOUNTER — Other Ambulatory Visit: Payer: Self-pay | Admitting: Cardiology

## 2015-12-22 ENCOUNTER — Telehealth: Payer: Self-pay | Admitting: Cardiology

## 2015-12-22 NOTE — Telephone Encounter (Signed)
I called patient to confirm INR check for Wednesday 12/24/15 and patient states she has not gotten the results for her doppler study from last week.

## 2015-12-24 ENCOUNTER — Ambulatory Visit (INDEPENDENT_AMBULATORY_CARE_PROVIDER_SITE_OTHER): Payer: Medicare Other | Admitting: Pharmacist Clinician (PhC)/ Clinical Pharmacy Specialist

## 2015-12-24 DIAGNOSIS — Z7901 Long term (current) use of anticoagulants: Secondary | ICD-10-CM

## 2015-12-24 DIAGNOSIS — Z952 Presence of prosthetic heart valve: Secondary | ICD-10-CM

## 2015-12-24 DIAGNOSIS — Z954 Presence of other heart-valve replacement: Secondary | ICD-10-CM

## 2015-12-24 LAB — POCT INR: INR: 3.1

## 2015-12-29 NOTE — Telephone Encounter (Signed)
Pt was provided results 12/26/15

## 2016-01-05 ENCOUNTER — Other Ambulatory Visit: Payer: Self-pay | Admitting: Internal Medicine

## 2016-01-12 ENCOUNTER — Ambulatory Visit (INDEPENDENT_AMBULATORY_CARE_PROVIDER_SITE_OTHER): Payer: Medicare Other | Admitting: Pharmacist

## 2016-01-12 DIAGNOSIS — Z954 Presence of other heart-valve replacement: Secondary | ICD-10-CM

## 2016-01-12 DIAGNOSIS — Z7901 Long term (current) use of anticoagulants: Secondary | ICD-10-CM | POA: Diagnosis not present

## 2016-01-12 DIAGNOSIS — Z952 Presence of prosthetic heart valve: Secondary | ICD-10-CM

## 2016-01-12 LAB — POCT INR: INR: 2.6

## 2016-02-03 ENCOUNTER — Telehealth: Payer: Self-pay | Admitting: *Deleted

## 2016-02-03 NOTE — Telephone Encounter (Signed)
Not on list and needs visit due to last visit >1 year ago.

## 2016-02-03 NOTE — Telephone Encounter (Signed)
Rec'd fax pt requesting refill on Tramadol 50 mg.../lmb

## 2016-02-03 NOTE — Telephone Encounter (Signed)
Faxed refill request back w/MD response...Abigail Oliver

## 2016-02-08 ENCOUNTER — Other Ambulatory Visit: Payer: Self-pay | Admitting: Internal Medicine

## 2016-02-09 ENCOUNTER — Ambulatory Visit (INDEPENDENT_AMBULATORY_CARE_PROVIDER_SITE_OTHER): Payer: Medicare Other | Admitting: Pharmacist Clinician (PhC)/ Clinical Pharmacy Specialist

## 2016-02-09 ENCOUNTER — Other Ambulatory Visit: Payer: Self-pay | Admitting: Internal Medicine

## 2016-02-09 ENCOUNTER — Other Ambulatory Visit: Payer: Self-pay | Admitting: Cardiovascular Disease

## 2016-02-09 DIAGNOSIS — Z954 Presence of other heart-valve replacement: Secondary | ICD-10-CM | POA: Diagnosis not present

## 2016-02-09 DIAGNOSIS — Z7901 Long term (current) use of anticoagulants: Secondary | ICD-10-CM | POA: Diagnosis not present

## 2016-02-09 DIAGNOSIS — Z952 Presence of prosthetic heart valve: Secondary | ICD-10-CM

## 2016-02-09 LAB — POCT INR: INR: 4

## 2016-02-27 ENCOUNTER — Other Ambulatory Visit: Payer: Self-pay | Admitting: Cardiology

## 2016-03-08 ENCOUNTER — Ambulatory Visit (INDEPENDENT_AMBULATORY_CARE_PROVIDER_SITE_OTHER): Payer: Medicare Other | Admitting: Pharmacist

## 2016-03-08 DIAGNOSIS — Z952 Presence of prosthetic heart valve: Secondary | ICD-10-CM

## 2016-03-08 DIAGNOSIS — Z7901 Long term (current) use of anticoagulants: Secondary | ICD-10-CM

## 2016-03-08 LAB — POCT INR: INR: 3.7

## 2016-03-11 ENCOUNTER — Encounter: Payer: Self-pay | Admitting: Medical

## 2016-03-11 ENCOUNTER — Telehealth: Payer: Self-pay | Admitting: Internal Medicine

## 2016-03-11 ENCOUNTER — Ambulatory Visit (HOSPITAL_BASED_OUTPATIENT_CLINIC_OR_DEPARTMENT_OTHER)
Admission: RE | Admit: 2016-03-11 | Discharge: 2016-03-11 | Disposition: A | Discharge status: Home / Self Care | Payer: Medicare Other | Source: Ambulatory Visit | Attending: Medical | Admitting: Medical

## 2016-03-11 ENCOUNTER — Ambulatory Visit (INDEPENDENT_AMBULATORY_CARE_PROVIDER_SITE_OTHER): Payer: Medicare Other | Admitting: Medical

## 2016-03-11 VITALS — BP 130/75 | HR 68 | Temp 98.0°F | Ht 62.0 in | Wt 187.4 lb

## 2016-03-11 DIAGNOSIS — D649 Anemia, unspecified: Secondary | ICD-10-CM

## 2016-03-11 DIAGNOSIS — M7989 Other specified soft tissue disorders: Secondary | ICD-10-CM | POA: Diagnosis not present

## 2016-03-11 DIAGNOSIS — S0093XA Contusion of unspecified part of head, initial encounter: Secondary | ICD-10-CM | POA: Diagnosis not present

## 2016-03-11 DIAGNOSIS — M79661 Pain in right lower leg: Secondary | ICD-10-CM | POA: Diagnosis not present

## 2016-03-11 DIAGNOSIS — L089 Local infection of the skin and subcutaneous tissue, unspecified: Secondary | ICD-10-CM | POA: Diagnosis not present

## 2016-03-11 DIAGNOSIS — M898X6 Other specified disorders of bone, lower leg: Secondary | ICD-10-CM

## 2016-03-11 DIAGNOSIS — I6523 Occlusion and stenosis of bilateral carotid arteries: Secondary | ICD-10-CM

## 2016-03-11 LAB — CBC WITH DIFFERENTIAL/PLATELET
Basophils Absolute: 0 10*3/uL (ref 0.0–0.1)
Basophils Relative: 0.6 % (ref 0.0–3.0)
Eosinophils Absolute: 0.1 10*3/uL (ref 0.0–0.7)
Eosinophils Relative: 1.9 % (ref 0.0–5.0)
HCT: 31 % — ABNORMAL LOW (ref 36.0–46.0)
Hemoglobin: 10.5 g/dL — ABNORMAL LOW (ref 12.0–15.0)
Lymphocytes Relative: 34 % (ref 12.0–46.0)
Lymphs Abs: 2.2 10*3/uL (ref 0.7–4.0)
MCHC: 33.8 g/dL (ref 30.0–36.0)
MCV: 84.9 fl (ref 78.0–100.0)
Monocytes Absolute: 0.7 10*3/uL (ref 0.1–1.0)
Monocytes Relative: 11.1 % (ref 3.0–12.0)
Neutro Abs: 3.4 10*3/uL (ref 1.4–7.7)
Neutrophils Relative %: 52.4 % (ref 43.0–77.0)
Platelets: 186 10*3/uL (ref 150.0–400.0)
RBC: 3.66 Mil/uL — ABNORMAL LOW (ref 3.87–5.11)
RDW: 16 % — ABNORMAL HIGH (ref 11.5–15.5)
WBC: 6.4 10*3/uL (ref 4.0–10.5)

## 2016-03-11 MED ORDER — CEPHALEXIN 500 MG PO CAPS: 500.0000 mg | ORAL_CAPSULE | Freq: Two times a day (BID) | ORAL | 0 refills | Status: DC

## 2016-03-11 MED FILL — CEPHALEXIN 500 MG CAPSULE: 500 | 10 days supply | Qty: 20 | Fill #0

## 2016-03-11 NOTE — Progress Notes (Signed)
Pre visit review using our clinic review tool, if applicable. No additional management support is needed unless otherwise documented below in the visit note. 

## 2016-03-11 NOTE — Telephone Encounter (Signed)
Anemia studies added. Blood work drawn yesterday.Can you add test.

## 2016-03-11 NOTE — Telephone Encounter (Signed)
Patient would like to transfer care from Dr Sharlet Salina to [facility]

## 2016-03-11 NOTE — Progress Notes (Signed)
Subjective:    Patient ID: Abigail Oliver, female    DOB: 05/06/1946, 70 y.o.   MRN: [id number]  HPI  Pt in for rt pretibial wound. She fell on pat Friday morning. She hit leg.It has gotten swollen, red and tender. At first bruised but now worse in terms of redness and warmth. She states was very swollen in the past. Over past week pt has felt some chills. But no fever. Some sweating ocasiaonally.  Pt has faint tender rt eyebrow presently. She explains when feel his her head.. No loc. No nausea,no vomiting, no gross motor or sensory function deficits. Now 6 days post accidentally and state rt eye brow minimally sore.  Pt needs tetanus vaccine.(Will give on follow up this coming Tuesday)  Pt is hx of prosthetic valve. Pt is coumadin. Pt INR followed by coumadin clinic.  Pt is diabetic. Her sugars are usually 120 or so when she checks.     Review of Systems  Constitutional: Positive for chills and diaphoresis. Negative for fatigue and fever.  HENT: Negative for congestion and drooling.   Respiratory: Negative for cough, chest tightness, shortness of breath and wheezing.   Cardiovascular: Negative for chest pain and palpitations.  Gastrointestinal: Negative for abdominal pain.  Musculoskeletal:       Rt lower ext- pain and swelling.  Skin: Positive for rash.       Rt pretibial redness and warmth.  Neurological: Negative for dizziness and headaches.  Hematological: Negative for adenopathy. Does not bruise/bleed easily.  Psychiatric/Behavioral: Negative for behavioral problems and confusion.    Past Medical History:  Diagnosis Date  . Anemia   . Anxiety   . Aortic stenosis    a. Now has St Jude mechanical aortic valve (6/00).b. Echo (6/15) with EF 60-65%, mechanical aortic valve with mean gradient 27 mmHg.  . Arthritis    a. Possible c-spine arthritis with pain down left arm.   . Carotid artery disease ([place])    a. Carotid dopplers (7/16) with 40-59% BICA stenosis.   .  Chronic angle-closure glaucoma(365.23)   . Chronic diastolic CHF (congestive heart failure) ([place])    a.  RHC (7/15) with mean RA 2, PA 22/11, mean PCWP 9, CI 3.79.  Marland Kitchen Colon polyps   . Contrast media allergy   . Coronary atherosclerosis of native coronary artery    a. CABG at time of AVR in 6/00 with RIMA-RCA.b. Abnl nuc 11/2013 -> LHC (7/15) with patent RIMA-RCA, 80% mRCA, 80% pLAD with FFR 0.73, treated with DES to pLAD.  Marland Kitchen Depression   . Dizziness 02/09/2012   a. Holter (8/14) with few PACs, otherwise unremarkable.   . Elevated transaminase level   . Essential hypertension   . Gastritis   . GERD (gastroesophageal reflux disease)   . Hyperlipidemia   . Low back pain   . Mechanical heart valve present   . Neuromuscular disorder (H[facility])    neuropathy  . Peripheral vascular disease ([place])    a, H/o Right SFA stent. b. Peripheral arterial dopplers (7/16) with right SFA stent patent.   Marland Kitchen PONV (postoperative nausea and vomiting)    N&V  . Pulmonary nodules    a. Noted by CT in 6/15. CT chest (8/15) was thought to indicate that nodules were benign. No further workup was recommended.   . Syncope 03/29/2012   carotid doppler - R ICA 50-69% reduction by velocities (low end); L ICA 0-49% reduction by velocities (low end); R and L subclavian arteries - <  50% redcution; R and L vertebral arteries show normal antegrade flow  . Type II diabetes mellitus ([place])      Social History   Social History  . Marital status: Married    Spouse name: [name]  . Number of children: 2  . Years of education: 14   Occupational History  .      hair dresser   Social History Main Topics  . Smoking status: Never Smoker  . Smokeless tobacco: Never Used  . Alcohol use No  . Drug use: No  . Sexual activity: No   Other Topics Concern  . Not on file   Social History Narrative   Pt is a high school graduate with 2 years of college. Married in 1967 she has 1 son born 15 and 1 daughter born 19 and 1  grandchild. 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      Caffeine use- 2-3 /day, soda, tea     Past Surgical History:  Procedure Laterality Date  . BILATERAL OOPHORECTOMY  1987  . BREAST BIOPSY  2012   left  . CARDIAC CATHETERIZATION  01/14/1999   normal LV function, severe aortic stenosis; 80% and 70% stenosis in RCA; mild 20% distal norrowing in L main with 20% proximal LAD stenosis, 40% diagonal stenosis and 20% proximal circumflex stenosis  . CARDIAC VALVE REPLACEMENT  2000   aortic valve   . CARDIOVASCULAR STRESS TEST  08/25/2011   R/L MV - EF 72%; no scintigraphic evidence of inducible MI; normal perfusionTID of 1.25 elevated - could indicate small vessle subendocardial ischemia; EKG NSR at 66, non diagnostic for ischemia  . [place]   bilaterally  . CATARACT EXTRACTION W/ INTRAOCULAR LENS  IMPLANT, BILATERAL  ~ 2007  . [place]; 1971  . CHOLECYSTECTOMY  1990  . COLONOSCOPY N/A 10/27/2012   Procedure: COLONOSCOPY;  Surgeon: Beryle Beams, MD;  Location: [facility]COPY;  Service: Endoscopy;  Laterality: N/A;  . CORONARY ARTERY BYPASS GRAFT  2000   RIMA to RCA  . DOPPLER ECHOCARDIOGRAPHY  03/29/2012   EF >55%; mild concentric LVH; stage 1 diastolic dysfunction, elevated LV filling pressure, dilated LA; MAC mild MR; St Jude AVR peak and mean gradients of 61mmHg and 58mmHg; transvalvular gradients have increased (prev 23 and 14 respectively)  . ESOPHAGOGASTRODUODENOSCOPY N/A 10/27/2012   Procedure: ESOPHAGOGASTRODUODENOSCOPY (EGD);  Surgeon: Beryle Beams, MD;  Location: [facility];  Service: Endoscopy;  Laterality: N/A;  . FEMORAL ARTERY STENT  09/20/2011   6 x 40 Smart Nitinol self-expanding stent placed;  10/15/2031 -R SFA stent open and patent w/o evidence of restenosis  . FRACTIONAL FLOW RESERVE WIRE  12/14/2013   Procedure: FRACTIONAL FLOW RESERVE WIRE;  Surgeon: Larey Dresser, MD;  Location: [facility] CATH LAB;  Service:  Cardiovascular;;  . LACERATION REPAIR     right hand  . LEFT AND RIGHT HEART CATHETERIZATION WITH CORONARY ANGIOGRAM N/A 12/14/2013   Procedure: LEFT AND RIGHT HEART CATHETERIZATION WITH CORONARY ANGIOGRAM;  Surgeon: Larey Dresser, MD;  Location: [facility] CATH LAB;  Service: Cardiovascular;  Laterality: N/A;  . LOWER EXTREMITY ANGIOGRAM N/A 09/20/2011   Procedure: LOWER EXTREMITY ANGIOGRAM;  Surgeon: Lorretta Harp, MD;  Location: [facility] CATH LAB;  Service: Cardiovascular;  Laterality: N/A;  . LYMPH NODE BIOPSY  06/2011   "core needle on 5"  . needle biopsy  2009   "on ankles for nerve damage"  . NEUROPLASTY /  TRANSPOSITION ULNAR NERVE AT ELBOW     right  . PERCUTANEOUS CORONARY STENT INTERVENTION (PCI-S)  12/14/2013   Procedure: PERCUTANEOUS CORONARY STENT INTERVENTION (PCI-S);  Surgeon: Larey Dresser, MD;  Location: [facility] CATH LAB;  Service: Cardiovascular;;  Prox LAD 3.00x12 Promus DES   . PERIPHERAL ARTERIAL STENT GRAFT  09/20/11   right SFA  . REFRACTIVE SURGERY  ` 2004   "for glaucoma"  . TONSILLECTOMY  1968  . TRIGGER FINGER RELEASE Left 12/04/2015   Procedure: LEFT RING FINGER TRIGGER RELEASE;  Surgeon: Leanora Cover, MD;  Location: [place];  Service: Orthopedics;  Laterality: Left;  Marland Kitchen VAGINAL HYSTERECTOMY  1985   Fibroids    Family History  Problem Relation Age of Onset  . Colon cancer Mother   . COPD Mother   . Emphysema Mother   . Diabetes Neg Hx   . Cancer Maternal Grandmother   . Cancer Maternal Grandfather   . Heart disease Brother     Allergies  Allergen Reactions  . Atorvastatin     Muscle pain  . Simvastatin Other (See Comments)    Muscle pain  . Sulfa Antibiotics     unknown  . Iodinated Diagnostic Agents Rash     Red rash after cardiac cath 1 wk ago, ? Contrast allergy, requires 13 hr prep now per [email or web address]    Current Outpatient Prescriptions on File Prior to Visit  Medication Sig Dispense Refill  . Ascorbic Acid (VITAMIN C) 500  MG tablet Take 500 mg by mouth 2 (two) times daily.     Marland Kitchen aspirin EC 81 MG tablet Take 1 tablet (81 mg total) by mouth daily.    . calcium-vitamin D (CALCIUM 500+D) 500-200 MG-UNIT per tablet Take 1 tablet by mouth 2 (two) times daily.     . citalopram (CELEXA) 20 MG tablet TAKE 1 TABLET (20 MG TOTAL) BY MOUTH AT BEDTIME. 30 tablet 4  . ferrous sulfate 325 (65 FE) MG tablet Take 1 tablet (325 mg total) by mouth daily. 30 tablet 0  . folic acid (FOLVITE) 1 MG tablet Take 1 mg by mouth daily.    Marland Kitchen gabapentin (NEURONTIN) 300 MG capsule TAKE 1 CAPSULE (300 MG TOTAL) BY MOUTH 3 (THREE) TIMES DAILY. 90 capsule 2  . HYDROcodone-acetaminophen (NORCO) 5-325 MG tablet 1-2 tabs po q6 hours prn pain 20 tablet 0  . latanoprost (XALATAN) 0.005 % ophthalmic solution PLACE 1 DROP INTO BOTH EYES AT BEDTIME. 2.5 mL 2  . metFORMIN (GLUCOPHAGE) 1000 MG tablet TAKE 1 TABLET (1,000 MG TOTAL) BY MOUTH 2 (TWO) TIMES DAILY WITH A MEAL. 60 tablet 10  . metoprolol succinate (TOPROL-XL) 50 MG 24 hr tablet TAKE 1 TABLET (50 MG TOTAL) BY MOUTH DAILY. 30 tablet 10  . nitroGLYCERIN (NITROSTAT) 0.4 MG SL tablet Place 1 tablet (0.4 mg total) under the tongue every 5 (five) minutes as needed for chest pain. 25 tablet 3  . pantoprazole (PROTONIX) 40 MG tablet TAKE 1 TABLET (40 MG TOTAL) BY MOUTH DAILY. 90 tablet 2  . potassium chloride SA (K-DUR,KLOR-CON) 20 MEQ tablet Take 40-60 mEq by mouth as directed. Take 3 tablets (60 meq) in the AM and 2 tablets (40 meq) in the PM    . rosuvastatin (CRESTOR) 20 MG tablet Take 1 tablet by mouth twice weekly. 90 tablet 0  . torsemide (DEMADEX) 20 MG tablet TAKE 3 TABLETS (60 MG) BY MOUTH EVERY MORNING AND 2 TABLETS (40 MG) BY MOUTH EVERY EVENING. 150 tablet 3  .  traZODone (DESYREL) 50 MG tablet TAKE 0.5-1 TABLETS (25-50 MG TOTAL) BY MOUTH AT BEDTIME AS NEEDED FOR SLEEP. 30 tablet 0  . warfarin (COUMADIN) 3 MG tablet TAKE 1-1.5 TABLETS BY MOUTH DAILY AS DIRECTED BY COUMADIN CLINIC 40 tablet 2    No current facility-administered medications on file prior to visit.     BP 100/60 (BP Location: Left Arm, Patient Position: Sitting)   Pulse 68   Temp 98 F (36.7 C) (Oral)   Ht 5\' 2"  (1.575 m)   Wt 187 lb 6.4 oz (85 kg)   SpO2 98%   BMI 34.28 kg/m       Objective:   Physical Exam  General- No acute distress. Pleasant patient. Neck- Full range of motion, no jvd Lungs- Clear, even and unlabored. Heart- regular rate and rhythm. Neurologic- CNII- XII grossly intact.  Rt lower- mid tibial area has 6 cm x 6 cm red, warm and tender area. Faint bruised appearance. Middle.  Mild very faint positive homans sign on rt side.        Assessment & Plan:  For your tibia pain will get xray. Make sure no fracture from the fall and no infection in bone.  You do appear to have skin infection/cellulitis.(possible hematoma as well).  Will rx keflex antibiotic. This should not effect your INR. May need to switch antibiotic if area worsens. Other antibiotic likely to affect INR. If any rapid spreading of redness then ED evaluation for IV antibiotic.  I don't think any work up of head contusion necessary based on physical exam findings and negative neurologic exams. Will repeat neurologic exam on follow up.(Please note by 6 days out from accident)  Note pt INR followed by coumading clinic.   Follow up on Monday or as needed  Note will follow pt faint popliteal pain. She is on coumadin. If any worse popliteal pain would get [place]. Including view over pretibial area to assess if pesristing hematoma?  When xray back plan to review what coumadin clinic had advised most recently.  Reggie Welge, Percell Miller, PA-C

## 2016-03-11 NOTE — Patient Instructions (Addendum)
For your tibia pain will get xray. Make sure no fracture from the fall and no infection in bone.  You do appear to have skin infection/cellulitis.(possible hematoma as well).  Will rx keflex antibiotic. This should not effect your INR. May need to switch antibiotic if area worsens. Other antibiotic likely to affect INR. If any rapid spreading of redness then ED evaluation for IV antibiotic.  I don't think any work up of head contusion necessary based on physical exam findings and negative neurologic exams.  Will get cbc to assess wbc level today.  Follow up on Monday or as needed

## 2016-03-12 ENCOUNTER — Telehealth: Payer: Self-pay | Admitting: Medical

## 2016-03-12 NOTE — Telephone Encounter (Signed)
She will be in next week. Can do on that date.

## 2016-03-12 NOTE — Telephone Encounter (Signed)
We cno not add on Iron studies, we do not have any Chemistry tubes to add them too. She only had a CBC done yesterday.

## 2016-03-16 ENCOUNTER — Encounter: Payer: Self-pay | Admitting: Medical

## 2016-03-16 ENCOUNTER — Telehealth: Payer: Self-pay | Admitting: Medical

## 2016-03-16 ENCOUNTER — Ambulatory Visit (INDEPENDENT_AMBULATORY_CARE_PROVIDER_SITE_OTHER): Payer: Medicare Other | Admitting: Medical

## 2016-03-16 ENCOUNTER — Ambulatory Visit (HOSPITAL_BASED_OUTPATIENT_CLINIC_OR_DEPARTMENT_OTHER)
Admission: RE | Admit: 2016-03-16 | Discharge: 2016-03-16 | Disposition: A | Discharge status: Home / Self Care | Payer: Medicare Other | Source: Ambulatory Visit | Attending: Medical | Admitting: Medical

## 2016-03-16 VITALS — BP 128/60 | Temp 98.0°F | Ht 62.0 in | Wt 184.8 lb

## 2016-03-16 DIAGNOSIS — M25531 Pain in right wrist: Secondary | ICD-10-CM | POA: Diagnosis not present

## 2016-03-16 DIAGNOSIS — I6523 Occlusion and stenosis of bilateral carotid arteries: Secondary | ICD-10-CM | POA: Diagnosis not present

## 2016-03-16 DIAGNOSIS — L089 Local infection of the skin and subcutaneous tissue, unspecified: Secondary | ICD-10-CM

## 2016-03-16 DIAGNOSIS — S6991XA Unspecified injury of right wrist, hand and finger(s), initial encounter: Secondary | ICD-10-CM | POA: Diagnosis not present

## 2016-03-16 DIAGNOSIS — Z23 Encounter for immunization: Secondary | ICD-10-CM | POA: Diagnosis not present

## 2016-03-16 DIAGNOSIS — M898X6 Other specified disorders of bone, lower leg: Secondary | ICD-10-CM

## 2016-03-16 NOTE — Progress Notes (Addendum)
Subjective:    Patient ID: Abigail Oliver, female    DOB: Sep 18, 1945, 70 y.o.   MRN: [id number]  HPI  Pt in for follow up.  Below is from last note.  Pt in for rt pretibial wound. She fell on past Friday morning. She hit leg.It has gotten swollen, red and tender. At first bruised but now worse in terms of redness and warmth. She states was very swollen in the past. Over past week pt has felt some chills. But no fever. Some sweating ocasiaonally.  Pt has faint tender rt eyebrow presently. She explains when feel his her head.. No loc. No nausea,no vomiting, no gross motor or sensory function deficits. Now 6 days post accidentally and state rt eye brow minimally sore.  Pt needs tetanus vaccine.(Will give on follow up this coming Tuesday)  End of prior not copy.  Pt not reporting problems or side effects with antibiotic. No fevers or sweats. No dc from the wound site.  X-ray showed not fracture or infection of bone. Area is less swollen, less tender and less warm.  Pt recently had coumadin adjustment since her INR on her last coumadin clinic visit. She was little high at 3.7. Her goal is 2.5-3.5.   Pt has new complaint of rt distal radius area pain. Pt states this has been the case after the fall. Pain is not improving much per pt. She continues to work cutting hair.   Review of Systems  Constitutional: Negative for chills, fatigue and fever.  Respiratory: Negative for cough, chest tightness, shortness of breath and wheezing.   Cardiovascular: Negative for chest pain and palpitations.  Gastrointestinal: Negative for abdominal pain.  Endocrine: Negative for polydipsia and polyuria.  Genitourinary: Negative for dysuria and frequency.  Musculoskeletal:       Some faint tender over her rt pretibial area.   Rt wrist pain.  Skin: Negative for color change, pallor and wound.       Red and infected area over rt pretibial area does look better per pt.  Neurological: Negative for  dizziness, numbness and headaches.  Hematological: Negative for adenopathy. Does not bruise/bleed easily.  Psychiatric/Behavioral: Negative for behavioral problems, confusion and dysphoric mood.    Past Medical History:  Diagnosis Date  . Anemia   . Anxiety   . Aortic stenosis    a. Now has St Jude mechanical aortic valve (6/00).b. Echo (6/15) with EF 60-65%, mechanical aortic valve with mean gradient 27 mmHg.  . Arthritis    a. Possible c-spine arthritis with pain down left arm.   . Carotid artery disease ([place])    a. Carotid dopplers (7/16) with 40-59% BICA stenosis.   . Chronic angle-closure glaucoma(365.23)   . Chronic diastolic CHF (congestive heart failure) ([place])    a.  RHC (7/15) with mean RA 2, PA 22/11, mean PCWP 9, CI 3.79.  Marland Kitchen Colon polyps   . Contrast media allergy   . Coronary atherosclerosis of native coronary artery    a. CABG at time of AVR in 6/00 with RIMA-RCA.b. Abnl nuc 11/2013 -> LHC (7/15) with patent RIMA-RCA, 80% mRCA, 80% pLAD with FFR 0.73, treated with DES to pLAD.  Marland Kitchen Depression   . Dizziness 02/09/2012   a. Holter (8/14) with few PACs, otherwise unremarkable.   . Elevated transaminase level   . Essential hypertension   . Gastritis   . GERD (gastroesophageal reflux disease)   . Hyperlipidemia   . Low back pain   . Mechanical heart valve  present   . Neuromuscular disorder (HCC)    neuropathy  . Peripheral vascular disease ([place])    a, H/o Right SFA stent. b. Peripheral arterial dopplers (7/16) with right SFA stent patent.   Marland Kitchen PONV (postoperative nausea and vomiting)    N&V  . Pulmonary nodules    a. Noted by CT in 6/15. CT chest (8/15) was thought to indicate that nodules were benign. No further workup was recommended.   . Syncope 03/29/2012   carotid doppler - R ICA 50-69% reduction by velocities (low end); L ICA 0-49% reduction by velocities (low end); R and L subclavian arteries - <50% redcution; R and L vertebral arteries show normal antegrade  flow  . Type II diabetes mellitus ([place])      Social History   Social History  . Marital status: Married    Spouse name: [name]  . Number of children: 2  . Years of education: 14   Occupational History  .      hair dresser   Social History Main Topics  . Smoking status: Never Smoker  . Smokeless tobacco: Never Used  . Alcohol use No  . Drug use: No  . Sexual activity: No   Other Topics Concern  . Not on file   Social History Narrative   Pt is a high school graduate with 2 years of college. Married in 1967 she has 1 son born 27 and 1 daughter born 30 and 1 grandchild. 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      Caffeine use- 2-3 /day, soda, tea     Past Surgical History:  Procedure Laterality Date  . BILATERAL OOPHORECTOMY  1987  . BREAST BIOPSY  2012   left  . CARDIAC CATHETERIZATION  01/14/1999   normal LV function, severe aortic stenosis; 80% and 70% stenosis in RCA; mild 20% distal norrowing in L main with 20% proximal LAD stenosis, 40% diagonal stenosis and 20% proximal circumflex stenosis  . CARDIAC VALVE REPLACEMENT  2000   aortic valve   . CARDIOVASCULAR STRESS TEST  08/25/2011   R/L MV - EF 72%; no scintigraphic evidence of inducible MI; normal perfusionTID of 1.25 elevated - could indicate small vessle subendocardial ischemia; EKG NSR at 66, non diagnostic for ischemia  . [place]   bilaterally  . CATARACT EXTRACTION W/ INTRAOCULAR LENS  IMPLANT, BILATERAL  ~ 2007  . [place]; 1971  . CHOLECYSTECTOMY  1990  . COLONOSCOPY N/A 10/27/2012   Procedure: COLONOSCOPY;  Surgeon: Beryle Beams, MD;  Location: [facility] ENDOSCOPY;  Service: Endoscopy;  Laterality: N/A;  . CORONARY ARTERY BYPASS GRAFT  2000   RIMA to RCA  . DOPPLER ECHOCARDIOGRAPHY  03/29/2012   EF >55%; mild concentric LVH; stage 1 diastolic dysfunction, elevated LV filling pressure, dilated LA; MAC mild MR; St Jude AVR peak and mean gradients of  53mmHg and 43mmHg; transvalvular gradients have increased (prev 23 and 14 respectively)  . ESOPHAGOGASTRODUODENOSCOPY N/A 10/27/2012   Procedure: ESOPHAGOGASTRODUODENOSCOPY (EGD);  Surgeon: Beryle Beams, MD;  Location: [facility];  Service: Endoscopy;  Laterality: N/A;  . FEMORAL ARTERY STENT  09/20/2011   6 x 40 Smart Nitinol self-expanding stent placed;  10/15/2031 -R SFA stent open and patent w/o evidence of restenosis  . FRACTIONAL FLOW RESERVE WIRE  12/14/2013   Procedure: FRACTIONAL FLOW RESERVE WIRE;  Surgeon: Larey Dresser, MD;  Location: [facility] CATH LAB;  Service: Cardiovascular;;  .  LACERATION REPAIR     right hand  . LEFT AND RIGHT HEART CATHETERIZATION WITH CORONARY ANGIOGRAM N/A 12/14/2013   Procedure: LEFT AND RIGHT HEART CATHETERIZATION WITH CORONARY ANGIOGRAM;  Surgeon: Larey Dresser, MD;  [facility];  Service: Cardiovascular;  Laterality: N/A;  . LOWER EXTREMITY ANGIOGRAM N/A 09/20/2011   Procedure: LOWER EXTREMITY ANGIOGRAM;  Surgeon: Lorretta Harp, MD;  Location: [facility] CATH LAB;  Service: Cardiovascular;  Laterality: N/A;  . LYMPH NODE BIOPSY  06/2011   "core needle on 5"  . needle biopsy  2009   "on ankles for nerve damage"  . NEUROPLASTY / TRANSPOSITION ULNAR NERVE AT ELBOW     right  . PERCUTANEOUS CORONARY STENT INTERVENTION (PCI-S)  12/14/2013   Procedure: PERCUTANEOUS CORONARY STENT INTERVENTION (PCI-S);  Surgeon: Larey Dresser, MD;  Location: [facility] CATH LAB;  Service: Cardiovascular;;  Prox LAD 3.00x12 Promus DES   . PERIPHERAL ARTERIAL STENT GRAFT  09/20/11   right SFA  . REFRACTIVE SURGERY  ` 2004   "for glaucoma"  . TONSILLECTOMY  1968  . TRIGGER FINGER RELEASE Left 12/04/2015   Procedure: LEFT RING FINGER TRIGGER RELEASE;  Surgeon: Leanora Cover, MD;  Location: [place];  Service: Orthopedics;  Laterality: Left;  Marland Kitchen VAGINAL HYSTERECTOMY  1985   Fibroids    Family History  Problem Relation Age of Onset  . Colon cancer Mother   . COPD  Mother   . Emphysema Mother   . Diabetes Neg Hx   . Cancer Maternal Grandmother   . Cancer Maternal Grandfather   . Heart disease Brother     Allergies  Allergen Reactions  . Atorvastatin     Muscle pain  . Simvastatin Other (See Comments)    Muscle pain  . Sulfa Antibiotics     unknown  . Iodinated Diagnostic Agents Rash     Red rash after cardiac cath 1 wk ago, ? Contrast allergy, requires 13 hr prep now per [email or web address]    Current Outpatient Prescriptions on File Prior to Visit  Medication Sig Dispense Refill  . Ascorbic Acid (VITAMIN C) 500 MG tablet Take 500 mg by mouth 2 (two) times daily.     Marland Kitchen aspirin EC 81 MG tablet Take 1 tablet (81 mg total) by mouth daily.    . calcium-vitamin D (CALCIUM 500+D) 500-200 MG-UNIT per tablet Take 1 tablet by mouth 2 (two) times daily.     . cephALEXin (KEFLEX) 500 MG capsule Take 1 capsule (500 mg total) by mouth 2 (two) times daily. 20 capsule 0  . citalopram (CELEXA) 20 MG tablet TAKE 1 TABLET (20 MG TOTAL) BY MOUTH AT BEDTIME. 30 tablet 4  . ferrous sulfate 325 (65 FE) MG tablet Take 1 tablet (325 mg total) by mouth daily. 30 tablet 0  . folic acid (FOLVITE) 1 MG tablet Take 1 mg by mouth daily.    Marland Kitchen gabapentin (NEURONTIN) 300 MG capsule TAKE 1 CAPSULE (300 MG TOTAL) BY MOUTH 3 (THREE) TIMES DAILY. 90 capsule 2  . HYDROcodone-acetaminophen (NORCO) 5-325 MG tablet 1-2 tabs po q6 hours prn pain 20 tablet 0  . latanoprost (XALATAN) 0.005 % ophthalmic solution PLACE 1 DROP INTO BOTH EYES AT BEDTIME. 2.5 mL 2  . metFORMIN (GLUCOPHAGE) 1000 MG tablet TAKE 1 TABLET (1,000 MG TOTAL) BY MOUTH 2 (TWO) TIMES DAILY WITH A MEAL. 60 tablet 10  . metoprolol succinate (TOPROL-XL) 50 MG 24 hr tablet TAKE 1 TABLET (50 MG TOTAL) BY MOUTH DAILY. [place]  tablet 10  . nitroGLYCERIN (NITROSTAT) 0.4 MG SL tablet Place 1 tablet (0.4 mg total) under the tongue every 5 (five) minutes as needed for chest pain. 25 tablet 3  . pantoprazole (PROTONIX) 40 MG  tablet TAKE 1 TABLET (40 MG TOTAL) BY MOUTH DAILY. 90 tablet 2  . potassium chloride SA (K-DUR,KLOR-CON) 20 MEQ tablet Take 40-60 mEq by mouth as directed. Take 3 tablets (60 meq) in the AM and 2 tablets (40 meq) in the PM    . rosuvastatin (CRESTOR) 20 MG tablet Take 1 tablet by mouth twice weekly. 90 tablet 0  . torsemide (DEMADEX) 20 MG tablet TAKE 3 TABLETS (60 MG) BY MOUTH EVERY MORNING AND 2 TABLETS (40 MG) BY MOUTH EVERY EVENING. 150 tablet 3  . traZODone (DESYREL) 50 MG tablet TAKE 0.5-1 TABLETS (25-50 MG TOTAL) BY MOUTH AT BEDTIME AS NEEDED FOR SLEEP. 30 tablet 0  . warfarin (COUMADIN) 3 MG tablet TAKE 1-1.5 TABLETS BY MOUTH DAILY AS DIRECTED BY COUMADIN CLINIC 40 tablet 2   No current facility-administered medications on file prior to visit.     BP 128/60   Temp 98 F (36.7 C) (Oral)   Ht 5\' 2"  (1.575 m)   Wt 184 lb 12.8 oz (83.8 kg)   BMI 33.80 kg/m       Objective:   Physical Exam   General- No acute distress. Pleasant patient. Neck- Full range of motion, no jvd Lungs- Clear, even and unlabored. Heart- regular rate and rhythm. Neurologic- CNII- XII grossly intact.  Rt lower- mid tibial area has 3.5  cm x 2.0 cm faint , much less warm and  Less tender area. Faint bruised appearance decreased. Middle.  Mild very faint positive homans sign on rt side.   Rt forearm- no upper aspect tenderness. Rt wrist- faint mild tenderness at very distal tip of radius.  Rt hand- no obvious tenderness to palpation. But positive finkelstein.       Assessment & Plan:  Your skin infection of rt pretibial area has improved. So will continue the cephalexin antibiotic for full 10 days.   Rt wrist pain. Will get xray to evaluate distal radius area. Recommend resting the area. And try to use thumb spica splint. As some of your pain may be tendon pain. Can use tylenol for mild pain. You mentioned you have tramadol at home. Use tramadol if needed.  Will get tdap. And flu vaccine  today.  Follow up in 1 month or as needed.(But come in 7-10 days if the area on rt pretibial area does not resolve completely).  I did give wrist cock up splint and asked pt to use as well as take break from cutting hair.   Kei Mcelhiney, Percell Miller, PA-C

## 2016-03-16 NOTE — Progress Notes (Signed)
Pre visit review using our clinic review tool, if applicable. No additional management support is needed unless otherwise documented below in the visit note./hsm  

## 2016-03-16 NOTE — Telephone Encounter (Signed)
Referral to hand specialist placed. 

## 2016-03-16 NOTE — Patient Instructions (Addendum)
Your skin infection of rt pretibial area has improved. So will continue the cephalexin antibiotic for full 10 days.   Rt wrist pain. Will get xray to evaluate distal radius area. Recommend resting the area. And try to use thumb spica splint. As some of your pain may be tendon pain. Can take tylenol for mild pain.  You mentioned you have tramadol at home. Use tramadol if needed.  Will get tdap. And flu vaccine today.  Follow up in 1 month or as needed.(But come in 7-10 days if the area on rt pretibial area does not resolve completely)

## 2016-03-17 ENCOUNTER — Telehealth: Payer: Self-pay | Admitting: Internal Medicine

## 2016-03-17 NOTE — Telephone Encounter (Signed)
Patient returned call regarding xray results. Please advise

## 2016-03-17 NOTE — Telephone Encounter (Signed)
Fine with me to switch 

## 2016-03-17 NOTE — Telephone Encounter (Signed)
Caller name:Faron Semidey Relationship to patient: Can be [place]:  Reason for call:Returning call regarding xray results

## 2016-03-18 ENCOUNTER — Telehealth: Payer: Self-pay | Admitting: Cardiology

## 2016-03-18 NOTE — Telephone Encounter (Signed)
New message       Pt states that she is returning someone's call----not sure who called

## 2016-03-18 NOTE — Telephone Encounter (Signed)
I spoke with pt on 03/17/16 with her recent results and she did not have any further questions.

## 2016-03-24 ENCOUNTER — Telehealth: Payer: Self-pay | Admitting: Medical

## 2016-03-24 NOTE — Telephone Encounter (Signed)
Caller name:Odessie Gargan Relationship to patient: Can be reached:[phone] Pharmacy:[facility] on [address]  Reason for call: Requesting for provider to prescribe lyrica, please advise

## 2016-03-24 NOTE — Telephone Encounter (Signed)
Regarding pt request for lyrica instead of neurontin. Does she want lyrica for diabetic neuropathy. Some insurance don't cover lyrica. It is best to come in week of November 13th to discuss reasons for switch(or even this Friday). Document facts regarding what she has tried in past in event she needs a prior authorization which may very well be the case. Also she can't take both lyrica and neurontin at same time. Has to choose one or the other.  I am not in the office this coming week.

## 2016-03-25 ENCOUNTER — Encounter (INDEPENDENT_AMBULATORY_CARE_PROVIDER_SITE_OTHER): Payer: Self-pay

## 2016-03-25 ENCOUNTER — Ambulatory Visit (INDEPENDENT_AMBULATORY_CARE_PROVIDER_SITE_OTHER): Payer: Medicare Other | Admitting: Medical

## 2016-03-25 VITALS — BP 100/75 | HR 76 | Temp 98.9°F | Wt 188.4 lb

## 2016-03-25 DIAGNOSIS — I6523 Occlusion and stenosis of bilateral carotid arteries: Secondary | ICD-10-CM

## 2016-03-25 DIAGNOSIS — G629 Polyneuropathy, unspecified: Secondary | ICD-10-CM

## 2016-03-25 DIAGNOSIS — S8011XS Contusion of right lower leg, sequela: Secondary | ICD-10-CM

## 2016-03-25 DIAGNOSIS — Z1231 Encounter for screening mammogram for malignant neoplasm of breast: Secondary | ICD-10-CM

## 2016-03-25 DIAGNOSIS — Z8679 Personal history of other diseases of the circulatory system: Secondary | ICD-10-CM

## 2016-03-25 DIAGNOSIS — L089 Local infection of the skin and subcutaneous tissue, unspecified: Secondary | ICD-10-CM | POA: Diagnosis not present

## 2016-03-25 DIAGNOSIS — R0600 Dyspnea, unspecified: Secondary | ICD-10-CM | POA: Diagnosis not present

## 2016-03-25 MED ORDER — DOXYCYCLINE HYCLATE 100 MG PO TABS: 100.0000 mg | ORAL_TABLET | Freq: Two times a day (BID) | ORAL | 0 refills | Status: DC

## 2016-03-25 MED ORDER — PREGABALIN 50 MG PO CAPS
50.0000 mg | ORAL_CAPSULE | Freq: Three times a day (TID) | ORAL | 0 refills | Status: DC
Start: 2016-03-25 — End: 2016-08-05

## 2016-03-25 NOTE — Patient Instructions (Addendum)
Your infection of leg seems resolved for most part. I think you have hematoma that will eventually resolve. I marked area and if you see  swelling past margins I marked, or you see redness, and get warmth then start doxycycline(this would be for infection). You will need to notify INR clinic so they can modify coumadin dose and advise on frequency of blood work evaluations.  If on follow up with me on April 05, 2016 the area on rt leg not resolved then will refer to wound management.  For your hx of chf and recent complaints will get cxr, bnp and cmp.  I sent lyrica to pharmacy see if you can pick up tomorrow in am. Stop neurontin for 24  Hours before starting lyrica. Will consider med for sleep if with decrease pain you are still unable to sleep.  Follow up Apr 05, 2016 or as needed

## 2016-03-25 NOTE — Progress Notes (Signed)
Pre visit review using our clinic review tool, if applicable. No additional management support is needed unless otherwise documented below in the visit note. 

## 2016-03-25 NOTE — Telephone Encounter (Signed)
LVM for patient letting her know to call and schedule an appt.

## 2016-03-25 NOTE — Telephone Encounter (Signed)
Please have the patient come in for an appointment with Percell Miller.

## 2016-03-25 NOTE — Progress Notes (Signed)
Subjective:    Patient ID: Abigail Oliver, female    DOB: 1946/02/06, 70 y.o.   MRN: [id number]  HPI    Pt in for follow up on her rt lower extremity pretibial area that was orignially very swollen after injury at end September. Leg was swollen almost like a baseball per pt. So swollen she could not pull her pants over area. Very swollen first 3 days. Has decreased gradually but not better completely yet.  I had saw pt and size was 6 cm x 6 cm on first visit with me. No discharge was seen. I had placed on keflex for infection. The area has gone down by about 50%. Pt is on coumadin. She finished antibiotic 4 days ago.  No fever, no chills or sweats.   Pt has diabetic neuropathy. She states has been on neurontin for a long time. On for 3-4 years. Not helping with her pain. Pt states it does not help. Pt wants to try lyrica.   Pt states tired. She states due to poor sleep. Pt recently only sleeping 3 hours a night. She states can't sleep  due to lower leg pain from neuropathy.   Pt has history. Pt is on toresemide, and potassium. Pt thinks she does not urinate enough. But does urinate 2-3 times a night. He weight is stable.Occasoional dypspnea but not presently.  Pt inr was 3.7 on last check about 11 days ago. She will have INR checked again on Monday.   Review of Systems  Constitutional: Negative for chills, fatigue and fever.  Respiratory: Positive for shortness of breath. Negative for cough, chest tightness and wheezing.        Mild transient. Not now.  Cardiovascular: Negative for chest pain and palpitations.  Gastrointestinal: Negative for abdominal pain.  Genitourinary: Negative for difficulty urinating and dysuria.  Musculoskeletal: Negative for back pain.       Some residual swelling of her contusion area.   Skin: Negative for rash.  Neurological: Negative for dizziness and headaches.  Hematological: Negative for adenopathy. Does not bruise/bleed easily.    Psychiatric/Behavioral: Positive for sleep disturbance. Negative for behavioral problems and decreased concentration.       Due to pain in legs at night/neuropathy    Past Medical History:  Diagnosis Date  . Anemia   . Anxiety   . Aortic stenosis    a. Now has St Jude mechanical aortic valve (6/00).b. Echo (6/15) with EF 60-65%, mechanical aortic valve with mean gradient 27 mmHg.  . Arthritis    a. Possible c-spine arthritis with pain down left arm.   . Carotid artery disease ([place])    a. Carotid dopplers (7/16) with 40-59% BICA stenosis.   . Chronic angle-closure glaucoma(365.23)   . Chronic diastolic CHF (congestive heart failure) ([place])    a.  RHC (7/15) with mean RA 2, PA 22/11, mean PCWP 9, CI 3.79.  Marland Kitchen Colon polyps   . Contrast media allergy   . Coronary atherosclerosis of native coronary artery    a. CABG at time of AVR in 6/00 with RIMA-RCA.b. Abnl nuc 11/2013 -> LHC (7/15) with patent RIMA-RCA, 80% mRCA, 80% pLAD with FFR 0.73, treated with DES to pLAD.  Marland Kitchen Depression   . Dizziness 02/09/2012   a. Holter (8/14) with few PACs, otherwise unremarkable.   . Elevated transaminase level   . Essential hypertension   . Gastritis   . GERD (gastroesophageal reflux disease)   . Hyperlipidemia   . Low back pain   .  Mechanical heart valve present   . Neuromuscular disorder ([facility])    neuropathy  . Peripheral vascular disease ([place])    a, H/o Right SFA stent. b. Peripheral arterial dopplers (7/16) with right SFA stent patent.   Marland Kitchen PONV (postoperative nausea and vomiting)    N&V  . Pulmonary nodules    a. Noted by CT in 6/15. CT chest (8/15) was thought to indicate that nodules were benign. No further workup was recommended.   . Syncope 03/29/2012   carotid doppler - R ICA 50-69% reduction by velocities (low end); L ICA 0-49% reduction by velocities (low end); R and L subclavian arteries - <50% redcution; R and L vertebral arteries show normal antegrade flow  . Type II diabetes  mellitus ([place])      Social History   Social History  . Marital status: Married    Spouse name: [name]  . Number of children: 2  . Years of education: 14   Occupational History  .      hair dresser   Social History Main Topics  . Smoking status: Never Smoker  . Smokeless tobacco: Never Used  . Alcohol use No  . Drug use: No  . Sexual activity: No   Other Topics Concern  . Not on file   Social History Narrative   Pt is a high school graduate with 2 years of college. Married in 1967 she has 1 son born 33 and 1 daughter born 48 and 1 grandchild. 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      Caffeine use- 2-3 /day, soda, tea     Past Surgical History:  Procedure Laterality Date  . BILATERAL OOPHORECTOMY  1987  . BREAST BIOPSY  2012   left  . CARDIAC CATHETERIZATION  01/14/1999   normal LV function, severe aortic stenosis; 80% and 70% stenosis in RCA; mild 20% distal norrowing in L main with 20% proximal LAD stenosis, 40% diagonal stenosis and 20% proximal circumflex stenosis  . CARDIAC VALVE REPLACEMENT  2000   aortic valve   . CARDIOVASCULAR STRESS TEST  08/25/2011   R/L MV - EF 72%; no scintigraphic evidence of inducible MI; normal perfusionTID of 1.25 elevated - could indicate small vessle subendocardial ischemia; EKG NSR at 66, non diagnostic for ischemia  . [place]   bilaterally  . CATARACT EXTRACTION W/ INTRAOCULAR LENS  IMPLANT, BILATERAL  ~ 2007  . [place]; 1971  . CHOLECYSTECTOMY  1990  . COLONOSCOPY N/A 10/27/2012   Procedure: COLONOSCOPY;  Surgeon: Beryle Beams, MD;  Location: [facility] ENDOSCOPY;  Service: Endoscopy;  Laterality: N/A;  . CORONARY ARTERY BYPASS GRAFT  2000   RIMA to RCA  . DOPPLER ECHOCARDIOGRAPHY  03/29/2012   EF >55%; mild concentric LVH; stage 1 diastolic dysfunction, elevated LV filling pressure, dilated LA; MAC mild MR; St Jude AVR peak and mean gradients of 72mmHg and 6mmHg;  transvalvular gradients have increased (prev 23 and 14 respectively)  . ESOPHAGOGASTRODUODENOSCOPY N/A 10/27/2012   Procedure: ESOPHAGOGASTRODUODENOSCOPY (EGD);  Surgeon: Beryle Beams, MD;  Location: [facility];  Service: Endoscopy;  Laterality: N/A;  . FEMORAL ARTERY STENT  09/20/2011   6 x 40 Smart Nitinol self-expanding stent placed;  10/15/2031 -R SFA stent open and patent w/o evidence of restenosis  . FRACTIONAL FLOW RESERVE WIRE  12/14/2013   Procedure: FRACTIONAL FLOW RESERVE WIRE;  Surgeon: Larey Dresser, MD;  Location: [facility] CATH LAB;  Service:  Cardiovascular;;  . LACERATION REPAIR     right hand  . LEFT AND RIGHT HEART CATHETERIZATION WITH CORONARY ANGIOGRAM N/A 12/14/2013   Procedure: LEFT AND RIGHT HEART CATHETERIZATION WITH CORONARY ANGIOGRAM;  Surgeon: Larey Dresser, MD;  Location: [facility] CATH LAB;  Service: Cardiovascular;  Laterality: N/A;  . LOWER EXTREMITY ANGIOGRAM N/A 09/20/2011   Procedure: LOWER EXTREMITY ANGIOGRAM;  Surgeon: Lorretta Harp, MD;  Location: [facility] CATH LAB;  Service: Cardiovascular;  Laterality: N/A;  . LYMPH NODE BIOPSY  06/2011   "core needle on 5"  . needle biopsy  2009   "on ankles for nerve damage"  . NEUROPLASTY / TRANSPOSITION ULNAR NERVE AT ELBOW     right  . PERCUTANEOUS CORONARY STENT INTERVENTION (PCI-S)  12/14/2013   Procedure: PERCUTANEOUS CORONARY STENT INTERVENTION (PCI-S);  Surgeon: Larey Dresser, MD;  Location: [facility] CATH LAB;  Service: Cardiovascular;;  Prox LAD 3.00x12 Promus DES   . PERIPHERAL ARTERIAL STENT GRAFT  09/20/11   right SFA  . REFRACTIVE SURGERY  ` 2004   "for glaucoma"  . TONSILLECTOMY  1968  . TRIGGER FINGER RELEASE Left 12/04/2015   Procedure: LEFT RING FINGER TRIGGER RELEASE;  Surgeon: Leanora Cover, MD;  Location: [facility];  Service: Orthopedics;  Laterality: Left;  Marland Kitchen VAGINAL HYSTERECTOMY  1985   Fibroids    Family History  Problem Relation Age of Onset  . Colon cancer Mother   . COPD Mother   .  Emphysema Mother   . Diabetes Neg Hx   . Cancer Maternal Grandmother   . Cancer Maternal Grandfather   . Heart disease Brother     Allergies  Allergen Reactions  . Atorvastatin     Muscle pain  . Simvastatin Other (See Comments)    Muscle pain  . Sulfa Antibiotics     unknown  . Iodinated Diagnostic Agents Rash     Red rash after cardiac cath 1 wk ago, ? Contrast allergy, requires 13 hr prep now per [email or web address]    Current Outpatient Prescriptions on File Prior to Visit  Medication Sig Dispense Refill  . Ascorbic Acid (VITAMIN C) 500 MG tablet Take 500 mg by mouth 2 (two) times daily.     Marland Kitchen aspirin EC 81 MG tablet Take 1 tablet (81 mg total) by mouth daily.    . calcium-vitamin D (CALCIUM 500+D) 500-200 MG-UNIT per tablet Take 1 tablet by mouth 2 (two) times daily.     . cephALEXin (KEFLEX) 500 MG capsule Take 1 capsule (500 mg total) by mouth 2 (two) times daily. 20 capsule 0  . citalopram (CELEXA) 20 MG tablet TAKE 1 TABLET (20 MG TOTAL) BY MOUTH AT BEDTIME. 30 tablet 4  . ferrous sulfate 325 (65 FE) MG tablet Take 1 tablet (325 mg total) by mouth daily. 30 tablet 0  . folic acid (FOLVITE) 1 MG tablet Take 1 mg by mouth daily.    Marland Kitchen gabapentin (NEURONTIN) 300 MG capsule TAKE 1 CAPSULE (300 MG TOTAL) BY MOUTH 3 (THREE) TIMES DAILY. 90 capsule 2  . HYDROcodone-acetaminophen (NORCO) 5-325 MG tablet 1-2 tabs po q6 hours prn pain 20 tablet 0  . latanoprost (XALATAN) 0.005 % ophthalmic solution PLACE 1 DROP INTO BOTH EYES AT BEDTIME. 2.5 mL 2  . metFORMIN (GLUCOPHAGE) 1000 MG tablet TAKE 1 TABLET (1,000 MG TOTAL) BY MOUTH 2 (TWO) TIMES DAILY WITH A MEAL. 60 tablet 10  . metoprolol succinate (TOPROL-XL) 50 MG 24 hr tablet TAKE 1 TABLET (50 MG TOTAL) BY  MOUTH DAILY. 30 tablet 10  . nitroGLYCERIN (NITROSTAT) 0.4 MG SL tablet Place 1 tablet (0.4 mg total) under the tongue every 5 (five) minutes as needed for chest pain. 25 tablet 3  . pantoprazole (PROTONIX) 40 MG tablet TAKE 1  TABLET (40 MG TOTAL) BY MOUTH DAILY. 90 tablet 2  . potassium chloride SA (K-DUR,KLOR-CON) 20 MEQ tablet Take 40-60 mEq by mouth as directed. Take 3 tablets (60 meq) in the AM and 2 tablets (40 meq) in the PM    . rosuvastatin (CRESTOR) 20 MG tablet Take 1 tablet by mouth twice weekly. 90 tablet 0  . torsemide (DEMADEX) 20 MG tablet TAKE 3 TABLETS (60 MG) BY MOUTH EVERY MORNING AND 2 TABLETS (40 MG) BY MOUTH EVERY EVENING. 150 tablet 3  . traZODone (DESYREL) 50 MG tablet TAKE 0.5-1 TABLETS (25-50 MG TOTAL) BY MOUTH AT BEDTIME AS NEEDED FOR SLEEP. 30 tablet 0  . warfarin (COUMADIN) 3 MG tablet TAKE 1-1.5 TABLETS BY MOUTH DAILY AS DIRECTED BY COUMADIN CLINIC 40 tablet 2   No current facility-administered medications on file prior to visit.     BP 100/75 (BP Location: Left Arm, Patient Position: Sitting, Cuff Size: Normal)   Pulse 76   Temp 98.9 F (37.2 C) (Oral)   Wt 188 lb 6.4 oz (85.5 kg)   SpO2 99%   BMI 34.46 kg/m       Objective:   Physical Exam  General- No acute distress. Pleasant patient. Neck- Full range of motion, no jvd Lungs- Clear, even and unlabored. Heart- regular rate and rhythm. Neurologic- CNII- XII grossly intact.  Rt lower- mid tibial area has 2.0  cm x 2.0 cm faint , no warmth, no redness but faint pain. bruised appearance decreased. Neg homans sign.  Lower ext- faint 1+ pedal edema. Negative homans signs bilaterally.    Assessment & Plan:  Your infection of leg seems resolved for most part. I think you have hematoma that will eventually resolve. I marked area and if you see  swelling past margins I marked, or you see redness, and get warmth then start doxycycline(this would be for infection). You will need to notify INR clinic so they can modify coumadin dose and advise on frequency of blood work evaluations.  If on follow up with me on April 05, 2016 the area on rt leg not resolved then will refer to wound management.  For your hx of chf and recent  complaints will get cxr, bnp and cmp.  I sent lyrica to pharmacy see if you can pick up tomorrow in am. Stop neurontin for 24  Hours before starting lyrica. Will consider med for sleep if with decrease pain you are still unable to sleep.  Follow up Apr 05, 2016 or as needed  [name], [place]

## 2016-03-28 ENCOUNTER — Other Ambulatory Visit: Payer: Self-pay | Admitting: Cardiology

## 2016-03-28 DIAGNOSIS — R739 Hyperglycemia, unspecified: Secondary | ICD-10-CM

## 2016-03-29 ENCOUNTER — Other Ambulatory Visit (INDEPENDENT_AMBULATORY_CARE_PROVIDER_SITE_OTHER): Payer: Medicare Other

## 2016-03-29 ENCOUNTER — Ambulatory Visit (HOSPITAL_BASED_OUTPATIENT_CLINIC_OR_DEPARTMENT_OTHER)
Admission: RE | Admit: 2016-03-29 | Discharge: 2016-03-29 | Disposition: A | Discharge status: Home / Self Care | Payer: Medicare Other | Source: Ambulatory Visit | Attending: Medical | Admitting: Medical

## 2016-03-29 DIAGNOSIS — R0600 Dyspnea, unspecified: Secondary | ICD-10-CM | POA: Insufficient documentation

## 2016-03-29 DIAGNOSIS — Z8679 Personal history of other diseases of the circulatory system: Secondary | ICD-10-CM

## 2016-03-29 DIAGNOSIS — R0602 Shortness of breath: Secondary | ICD-10-CM | POA: Diagnosis not present

## 2016-03-29 LAB — COMPREHENSIVE METABOLIC PANEL
ALT: 15 U/L (ref 0–35)
AST: 22 U/L (ref 0–37)
Albumin: 4.1 g/dL (ref 3.5–5.2)
Alkaline Phosphatase: 63 U/L (ref 39–117)
BUN: 18 mg/dL (ref 6–23)
CO2: 31 mEq/L (ref 19–32)
Calcium: 10.1 mg/dL (ref 8.4–10.5)
Chloride: 102 mEq/L (ref 96–112)
Creatinine, Ser: 1.13 mg/dL (ref 0.40–1.20)
GFR: 50.51 mL/min — ABNORMAL LOW (ref >60.00)
Glucose, Bld: 119 mg/dL — ABNORMAL HIGH (ref 70–99)
Potassium: 4.4 mEq/L (ref 3.5–5.1)
Sodium: 141 mEq/L (ref 135–145)
Total Bilirubin: 0.7 mg/dL (ref 0.2–1.2)
Total Protein: 7.3 g/dL (ref 6.0–8.3)

## 2016-03-29 LAB — BRAIN NATRIURETIC PEPTIDE: Pro B Natriuretic peptide (BNP): 177 pg/mL — ABNORMAL HIGH (ref 0.0–100.0)

## 2016-03-29 NOTE — Telephone Encounter (Signed)
I sent in pt k-dur rx. You were trying to give her 3 moth supply with 1 refill. You had preloaded #360 tab and she takes 5 tabs a day. So I gave her 450 tabs.  Just double checking that she does take 5 tabs a day. Let me know since I sent in higher number than you put in your sig. Thanks,

## 2016-03-30 ENCOUNTER — Ambulatory Visit (INDEPENDENT_AMBULATORY_CARE_PROVIDER_SITE_OTHER): Payer: Medicare Other | Admitting: Pharmacist

## 2016-03-30 ENCOUNTER — Telehealth: Payer: Self-pay

## 2016-03-30 DIAGNOSIS — Z952 Presence of prosthetic heart valve: Secondary | ICD-10-CM

## 2016-03-30 DIAGNOSIS — Z7901 Long term (current) use of anticoagulants: Secondary | ICD-10-CM | POA: Diagnosis not present

## 2016-03-30 DIAGNOSIS — I6523 Occlusion and stenosis of bilateral carotid arteries: Secondary | ICD-10-CM

## 2016-03-30 LAB — POCT INR: INR: 3.4

## 2016-03-30 NOTE — Addendum Note (Signed)
Addended by: Tasia Catchings on: 03/30/2016 10:09 AM   Modules accepted: Orders

## 2016-03-30 NOTE — Telephone Encounter (Signed)
The patient is requesting a bone density scan this year when she completes her mammogram. The last reports states every 2 years but she wants it completed on a yearly basis.   Please advise.Marland Kitchen  HM

## 2016-03-30 NOTE — Telephone Encounter (Addendum)
The report recommends getting every 2 years((Reviewed her last report). This is standard and what radiologist recommended. My question is has she been taking calcium 1200 mg a day and vitamin D 800 IU otc every day. Also has she ever been on any medication such as fosamax to increase bone density. She is 66 and often time when had osteopenia or osteoporosis in past can rx this type of med. But meds such s fosamax are not given indefinitely. Can discuss further with her when she is in for follow up to check her rt pretibial area.   I would not recommend repeating dexa scan early since insurance likely won't pay and won't change treatment. But please keep follow up so we can discuss.

## 2016-03-30 NOTE — Addendum Note (Signed)
Addended by: Tasia Catchings on: 03/30/2016 01:29 PM   Modules accepted: Orders

## 2016-04-01 NOTE — Telephone Encounter (Signed)
I left a message for the patient to call back 

## 2016-04-28 ENCOUNTER — Other Ambulatory Visit: Payer: Self-pay | Admitting: Internal Medicine

## 2016-04-29 ENCOUNTER — Ambulatory Visit (INDEPENDENT_AMBULATORY_CARE_PROVIDER_SITE_OTHER): Payer: Medicare Other | Admitting: Pharmacist Clinician (PhC)/ Clinical Pharmacy Specialist

## 2016-04-29 DIAGNOSIS — Z952 Presence of prosthetic heart valve: Secondary | ICD-10-CM | POA: Diagnosis not present

## 2016-04-29 DIAGNOSIS — I6523 Occlusion and stenosis of bilateral carotid arteries: Secondary | ICD-10-CM

## 2016-04-29 DIAGNOSIS — Z7901 Long term (current) use of anticoagulants: Secondary | ICD-10-CM | POA: Diagnosis not present

## 2016-04-29 LAB — POCT INR: INR: 2.5

## 2016-05-21 ENCOUNTER — Other Ambulatory Visit: Payer: Self-pay | Admitting: Cardiology

## 2016-05-25 ENCOUNTER — Other Ambulatory Visit: Payer: Self-pay | Admitting: Internal Medicine

## 2016-05-25 NOTE — Telephone Encounter (Signed)
Call pt and do have her come in also refill request for trazadone.But qt prolongation with celexa which she is on. So I did not fill. Will discuss options for sleep when she is in.

## 2016-05-25 NOTE — Telephone Encounter (Signed)
I got a request from tramadol for pain. I believe she had at home and other provider wrote the rx. Though I saw her for some painful conditions. Since controlled med I would prefer she schedule appointment. How much pain is she in and where?

## 2016-06-08 ENCOUNTER — Other Ambulatory Visit: Payer: Self-pay | Admitting: Internal Medicine

## 2016-06-08 DIAGNOSIS — Z1231 Encounter for screening mammogram for malignant neoplasm of breast: Secondary | ICD-10-CM

## 2016-06-09 ENCOUNTER — Ambulatory Visit: Payer: Self-pay

## 2016-06-18 ENCOUNTER — Other Ambulatory Visit: Payer: Self-pay | Admitting: Cardiovascular Disease

## 2016-07-06 ENCOUNTER — Ambulatory Visit (INDEPENDENT_AMBULATORY_CARE_PROVIDER_SITE_OTHER): Payer: Medicare Other | Admitting: Pharmacist

## 2016-07-06 DIAGNOSIS — Z7901 Long term (current) use of anticoagulants: Secondary | ICD-10-CM

## 2016-07-06 DIAGNOSIS — Z952 Presence of prosthetic heart valve: Secondary | ICD-10-CM

## 2016-07-06 LAB — POCT INR: INR: 2.9

## 2016-07-14 ENCOUNTER — Other Ambulatory Visit: Payer: Self-pay | Admitting: Cardiovascular Disease

## 2016-07-19 ENCOUNTER — Telehealth: Payer: Self-pay | Admitting: Internal Medicine

## 2016-07-19 NOTE — Telephone Encounter (Signed)
Spoke with patient regarding annual wellness visit. Patient stated that she will give office a call back to schedule appt.

## 2016-07-27 ENCOUNTER — Telehealth: Payer: Self-pay | Admitting: Internal Medicine

## 2016-08-05 ENCOUNTER — Encounter: Payer: Self-pay | Admitting: Medical

## 2016-08-05 ENCOUNTER — Ambulatory Visit (INDEPENDENT_AMBULATORY_CARE_PROVIDER_SITE_OTHER): Payer: Medicare Other | Admitting: Medical

## 2016-08-05 VITALS — BP 130/70 | HR 82 | Temp 98.0°F | Resp 16 | Ht 62.0 in | Wt 189.4 lb

## 2016-08-05 DIAGNOSIS — Z8679 Personal history of other diseases of the circulatory system: Secondary | ICD-10-CM

## 2016-08-05 DIAGNOSIS — R0981 Nasal congestion: Secondary | ICD-10-CM

## 2016-08-05 DIAGNOSIS — E119 Type 2 diabetes mellitus without complications: Secondary | ICD-10-CM | POA: Diagnosis not present

## 2016-08-05 DIAGNOSIS — J0100 Acute maxillary sinusitis, unspecified: Secondary | ICD-10-CM

## 2016-08-05 DIAGNOSIS — H938X3 Other specified disorders of ear, bilateral: Secondary | ICD-10-CM | POA: Diagnosis not present

## 2016-08-05 MED ORDER — BENZONATATE 100 MG PO CAPS: 100.0000 mg | ORAL_CAPSULE | Freq: Three times a day (TID) | ORAL | 0 refills | Status: DC | PRN

## 2016-08-05 MED ORDER — FLUTICASONE PROPIONATE 50 MCG/ACT NA SUSP: 2.0000 | Freq: Every day | NASAL | 1 refills | Status: DC

## 2016-08-05 MED ORDER — AMOXICILLIN-POT CLAVULANATE 875-125 MG PO TABS: 1.0000 | ORAL_TABLET | Freq: Two times a day (BID) | ORAL | 0 refills | Status: DC

## 2016-08-05 NOTE — Progress Notes (Signed)
Subjective:    Patient ID: Abigail Oliver, female    DOB: 1945/09/24, 71 y.o.   MRN: [id number]  HPI  Pt had over one week of st. Some mild sumbmandibular node tenderness. Pt has a lot of pnd and nasal congestion. Some frontal sinus pain. Also some occasional ear pressure.  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  Pt has some chronic med problems and admits only taking morning doses of meds.    Review of Systems  Constitutional: Negative for chills, fatigue and fever.  HENT: Positive for congestion, sinus pain, sinus pressure and sore throat.        Mild st.  Respiratory: Positive for cough. Negative for chest tightness, shortness of breath and wheezing.   Cardiovascular: Negative for chest pain and palpitations.  Gastrointestinal: Negative for abdominal pain.  Musculoskeletal: Negative for back pain, gait problem, myalgias and neck stiffness.  Skin: Negative for rash.  Neurological: Negative for dizziness and headaches.  Hematological: Positive for adenopathy. Does not bruise/bleed easily.  Psychiatric/Behavioral: Negative for behavioral problems and confusion.    Past Medical History:  Diagnosis Date  . Anemia   . Anxiety   . Aortic stenosis    a. Now has St Jude mechanical aortic valve (6/00).b. Echo (6/15) with EF 60-65%, mechanical aortic valve with mean gradient 27 mmHg.  . Arthritis    a. Possible c-spine arthritis with pain down left arm.   . Carotid artery disease ([place])    a. Carotid dopplers (7/16) with 40-59% BICA stenosis.   . Chronic angle-closure glaucoma(365.23)   . Chronic diastolic CHF (congestive heart failure) ([place])    a.  RHC (7/15) with mean RA 2, PA 22/11, mean PCWP 9, CI 3.79.  Marland Kitchen Colon polyps   . Contrast media allergy   . Coronary atherosclerosis of native coronary artery    a. CABG at time of AVR in 6/00 with RIMA-RCA.b. Abnl nuc 11/2013 -> LHC (7/15) with patent RIMA-RCA, 80% mRCA, 80% pLAD with FFR 0.73,  treated with DES to pLAD.  Marland Kitchen Depression   . Dizziness 02/09/2012   a. Holter (8/14) with few PACs, otherwise unremarkable.   . Elevated transaminase level   . Essential hypertension   . Gastritis   . GERD (gastroesophageal reflux disease)   . Hyperlipidemia   . Low back pain   . Mechanical heart valve present   . Neuromuscular disorder ([facility])    neuropathy  . Peripheral vascular disease ([place])    a, H/o Right SFA stent. b. Peripheral arterial dopplers (7/16) with right SFA stent patent.   Marland Kitchen PONV (postoperative nausea and vomiting)    N&V  . Pulmonary nodules    a. Noted by CT in 6/15. CT chest (8/15) was thought to indicate that nodules were benign. No further workup was recommended.   . Syncope 03/29/2012   carotid doppler - R ICA 50-69% reduction by velocities (low end); L ICA 0-49% reduction by velocities (low end); R and L subclavian arteries - <50% redcution; R and L vertebral arteries show normal antegrade flow  . Type II diabetes mellitus ([place])      Social History   Social History  . Marital status: Married    Spouse name: [name]  . Number of children: 2  . Years of education: 14   Occupational History  .      hair dresser   Social History Main Topics  . Smoking status: Never Smoker  .  Smokeless tobacco: Never Used  . Alcohol use No  . Drug use: No  . Sexual activity: No   Other Topics Concern  . Not on file   Social History Narrative   Pt is a high school graduate with 2 years of college. Married in 1967 she has 1 son born 55 and 1 daughter born 50 and 1 grandchild. 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      Caffeine use- 2-3 /day, soda, tea     Past Surgical History:  Procedure Laterality Date  . BILATERAL OOPHORECTOMY  1987  . BREAST BIOPSY  2012   left  . CARDIAC CATHETERIZATION  01/14/1999   normal LV function, severe aortic stenosis; 80% and 70% stenosis in RCA; mild 20% distal norrowing in L main with 20% proximal LAD  stenosis, 40% diagonal stenosis and 20% proximal circumflex stenosis  . CARDIAC VALVE REPLACEMENT  2000   aortic valve   . CARDIOVASCULAR STRESS TEST  08/25/2011   R/L MV - EF 72%; no scintigraphic evidence of inducible MI; normal perfusionTID of 1.25 elevated - could indicate small vessle subendocardial ischemia; EKG NSR at 66, non diagnostic for ischemia  . [place]   bilaterally  . CATARACT EXTRACTION W/ INTRAOCULAR LENS  IMPLANT, BILATERAL  ~ 2007  . [place]; 1971  . CHOLECYSTECTOMY  1990  . COLONOSCOPY N/A 10/27/2012   Procedure: COLONOSCOPY;  Surgeon: Beryle Beams, MD;  Location: [facility];  Service: Endoscopy;  Laterality: N/A;  . CORONARY ARTERY BYPASS GRAFT  2000   RIMA to RCA  . DOPPLER ECHOCARDIOGRAPHY  03/29/2012   EF >55%; mild concentric LVH; stage 1 diastolic dysfunction, elevated LV filling pressure, dilated LA; MAC mild MR; St Jude AVR peak and mean gradients of 29mHg and 215mg; transvalvular gradients have increased (prev 23 and 14 respectively)  . ESOPHAGOGASTRODUODENOSCOPY N/A 10/27/2012   Procedure: ESOPHAGOGASTRODUODENOSCOPY (EGD);  Surgeon: PaBeryle BeamsMD;  [facility];  Service: Endoscopy;  Laterality: N/A;  . FEMORAL ARTERY STENT  09/20/2011   6 x 40 Smart Nitinol self-expanding stent placed;  10/15/2031 -R SFA stent open and patent w/o evidence of restenosis  . FRACTIONAL FLOW RESERVE WIRE  12/14/2013   Procedure: FRACTIONAL FLOW RESERVE WIRE;  Surgeon: DaLarey DresserMD;  [facility];  Service: Cardiovascular;;  . LACERATION REPAIR     right hand  . LEFT AND RIGHT HEART CATHETERIZATION WITH CORONARY ANGIOGRAM N/A 12/14/2013   Procedure: LEFT AND RIGHT HEART CATHETERIZATION WITH CORONARY ANGIOGRAM;  Surgeon: DaLarey DresserMD;  [facility];  Service: Cardiovascular;  Laterality: N/A;  . LOWER EXTREMITY ANGIOGRAM N/A 09/20/2011   Procedure: LOWER EXTREMITY ANGIOGRAM;  Surgeon: JoLorretta HarpMD;   [facility];  Service: Cardiovascular;  Laterality: N/A;  . LYMPH NODE BIOPSY  06/2011   "core needle on 5"  . needle biopsy  2009   "on ankles for nerve damage"  . NEUROPLASTY / TRANSPOSITION ULNAR NERVE AT ELBOW     right  . PERCUTANEOUS CORONARY STENT INTERVENTION (PCI-S)  12/14/2013   Procedure: PERCUTANEOUS CORONARY STENT INTERVENTION (PCI-S);  Surgeon: DaLarey DresserMD;  [facility];  Service: Cardiovascular;;  Prox LAD 3.00x12 Promus DES   . PERIPHERAL ARTERIAL STENT GRAFT  09/20/11   right SFA  . REFRACTIVE SURGERY  ` 2004   "for glaucoma"  . TONSILLECTOMY  1968  . TRIGGER  FINGER RELEASE Left 12/04/2015   Procedure: LEFT RING FINGER TRIGGER RELEASE;  Surgeon: Leanora Cover, MD;  Location: [place];  Service: Orthopedics;  Laterality: Left;  Marland Kitchen VAGINAL HYSTERECTOMY  1985   Fibroids    Family History  Problem Relation Age of Onset  . Colon cancer Mother   . COPD Mother   . Emphysema Mother   . Diabetes Neg Hx   . Cancer Maternal Grandmother   . Cancer Maternal Grandfather   . Heart disease Brother     Allergies  Allergen Reactions  . Atorvastatin     Muscle pain  . Simvastatin Other (See Comments)    Muscle pain  . Sulfa Antibiotics     unknown  . Iodinated Diagnostic Agents Rash     Red rash after cardiac cath 1 wk ago, ? Contrast allergy, requires 13 hr prep now per [email or web address]    Current Outpatient Prescriptions on File Prior to Visit  Medication Sig Dispense Refill  . Ascorbic Acid (VITAMIN C) 500 MG tablet Take 500 mg by mouth 2 (two) times daily.     Marland Kitchen aspirin EC 81 MG tablet Take 1 tablet (81 mg total) by mouth daily.    . calcium-vitamin D (CALCIUM 500+D) 500-200 MG-UNIT per tablet Take 1 tablet by mouth 2 (two) times daily.     . citalopram (CELEXA) 20 MG tablet TAKE 1 TABLET (20 MG TOTAL) BY MOUTH AT BEDTIME. 30 tablet 3  . ferrous sulfate 325 (65 FE) MG tablet Take 1 tablet (325 mg total) by mouth daily.  30 tablet 0  . folic acid (FOLVITE) 1 MG tablet Take 1 mg by mouth daily.    Marland Kitchen gabapentin (NEURONTIN) 300 MG capsule TAKE 1 CAPSULE (300 MG TOTAL) BY MOUTH 3 (THREE) TIMES DAILY. 90 capsule 1  . latanoprost (XALATAN) 0.005 % ophthalmic solution PLACE 1 DROP INTO BOTH EYES AT BEDTIME. 2.5 mL 2  . metFORMIN (GLUCOPHAGE) 1000 MG tablet TAKE 1 TABLET (1,000 MG TOTAL) BY MOUTH 2 (TWO) TIMES DAILY WITH A MEAL. 60 tablet 10  . metoprolol succinate (TOPROL-XL) 50 MG 24 hr tablet TAKE 1 TABLET (50 MG TOTAL) BY MOUTH DAILY. 30 tablet 10  . nitroGLYCERIN (NITROSTAT) 0.4 MG SL tablet Place 1 tablet (0.4 mg total) under the tongue every 5 (five) minutes as needed for chest pain. 25 tablet 3  . pantoprazole (PROTONIX) 40 MG tablet Take 1 tablet (40 mg total) by mouth daily. Please call and schedule follow up appt for further refills [phone] 90 tablet 0  . Potassium Chloride ER 20 MEQ TBCR TAKE 3 TABLETS (60 MEQ) IN THE AM AND 2 TABLETS (40 MEQ) IN THE PM 450 tablet 1  . potassium chloride SA (K-DUR,KLOR-CON) 20 MEQ tablet Take 40-60 mEq by mouth as directed. Take 3 tablets (60 meq) in the AM and 2 tablets (40 meq) in the PM    . rosuvastatin (CRESTOR) 20 MG tablet Take 1 tablet by mouth twice weekly. 90 tablet 0  . torsemide (DEMADEX) 20 MG tablet TAKE 3 TABLETS (60 MG) BY MOUTH EVERY MORNING AND 2 TABLETS (40 MG) BY MOUTH EVERY EVENING. 150 tablet 3  . traZODone (DESYREL) 50 MG tablet TAKE 0.5-1 TABLETS (25-50 MG TOTAL) BY MOUTH AT BEDTIME AS NEEDED FOR SLEEP. 30 tablet 0  . warfarin (COUMADIN) 3 MG tablet TAKE 1-1.5 TABLETS BY MOUTH DAILY AS DIRECTED BY COUMADIN CLINIC 40 tablet 1   No current facility-administered medications on file prior to visit.  BP 130/70 (BP Location: Right Arm, Cuff Size: Normal)   Pulse 82   Temp 98 F (36.7 C) (Oral)   Resp 16   Ht _0  (1.575 m)   Wt 189 lb 6.4 oz (85.9 kg)   SpO2 98%   BMI 34.64 kg/m       Objective:   Physical Exam  General  Mental  Status - Alert. General Appearance - Well groomed. Not in acute distress.  Skin Rashes- No Rashes.  HEENT Head- Normal. Ear Auditory Canal - Left- Normal. Right - Normal.Tympanic Membrane- Left- Normal. Right- Normal. Eye Sclera/Conjunctiva- Left- Normal. Right- Normal. Nose & Sinuses Nasal Mucosa- Left-  Boggy and Congested. Right-  Boggy and  Congested.Bilateral  No maxillary but  frontal sinus pressure.  Mouth & Throat Lips: Upper Lip- Normal: no dryness, cracking, pallor, cyanosis, or vesicular eruption. Lower Lip-Normal: no dryness, cracking, pallor, cyanosis or vesicular eruption. Buccal Mucosa- Bilateral- No Aphthous ulcers. Oropharynx- No Discharge or Erythema. Tonsils: Characteristics- Bilateral- faint  Erythema.  Size/Enlargement- Bilateral- No enlargement. Discharge- bilateral-None.  Neck Neck- Supple. No Masses.   Chest and Lung Exam Auscultation: Breath Sounds:-Clear even and unlabored.  Cardiovascular Auscultation:Rythm- Regular, rate and rhythm. Murmurs & Other Heart Sounds:Ausculatation of the heart reveal- No Murmurs.  Lymphatic Head & Neck General Head & Neck Lymphatics: Bilateral: Description- No Localized lymphadenopathy.        Assessment & Plan:  Your appear to have a sinus infection frontal region. I am prescribing augmentin antibiotic for the infection. To help with the nasal congestion I prescribed nasal steroid flonase. For your associated cough rx benzonatate  For your chronic medical problems recommend compliance on your meds. Will put in future order cmp and a1c. Counseled pt potential problems of noncompliance.  Rest, hydrate, tylenol for fever.  Follow up in 7 days or as needed.  Warnell Rasnic, Percell Miller, PA-C

## 2016-08-05 NOTE — Progress Notes (Signed)
Pre visit review using our clinic review tool, if applicable. No additional management support is needed unless otherwise documented below in the visit note. 

## 2016-08-05 NOTE — Patient Instructions (Addendum)
Your appear to have a sinus infection frontal region. I am prescribing augmentin antibiotic for the infection. To help with the nasal congestion I prescribed nasal steroid flonase. For your associated cough rx benzonatate  For your chronic medical problems recommend compliance on your meds. Will put in future order cmp and a1c. Counseled pt potential problems of noncompliance.  Rest, hydrate, tylenol for fever.  Follow up in 7 days or as needed.

## 2016-08-14 ENCOUNTER — Other Ambulatory Visit: Payer: Self-pay | Admitting: Cardiology

## 2016-08-31 ENCOUNTER — Ambulatory Visit (INDEPENDENT_AMBULATORY_CARE_PROVIDER_SITE_OTHER): Payer: Medicare Other | Admitting: Pharmacist Clinician (PhC)/ Clinical Pharmacy Specialist

## 2016-08-31 DIAGNOSIS — Z7901 Long term (current) use of anticoagulants: Secondary | ICD-10-CM

## 2016-08-31 DIAGNOSIS — Z952 Presence of prosthetic heart valve: Secondary | ICD-10-CM

## 2016-08-31 LAB — POCT INR: INR: 3.6

## 2016-09-07 ENCOUNTER — Other Ambulatory Visit: Payer: Self-pay | Admitting: Cardiovascular Disease

## 2016-09-09 ENCOUNTER — Telehealth: Payer: Self-pay | Admitting: Internal Medicine

## 2016-09-09 ENCOUNTER — Other Ambulatory Visit: Payer: Self-pay | Admitting: Internal Medicine

## 2016-09-09 NOTE — Telephone Encounter (Signed)
Patient is requesting to transfer from Dr. Sharlet Salina to [facility]. Please advise. If [facility] accepts please schedule from [facility] location. Thanks

## 2016-09-09 NOTE — Telephone Encounter (Signed)
Fine with me

## 2016-09-09 NOTE — Telephone Encounter (Signed)
Called patient regarding awv. Pt stated that she is not interested in scheduling appt at this time. Pt would like to change PCP. Sent msg to Dr. Sharlet Salina and [name].

## 2016-09-10 NOTE — Telephone Encounter (Signed)
Ok to switch to me. Her pcp ok'd.

## 2016-09-13 ENCOUNTER — Telehealth: Payer: Self-pay | Admitting: Internal Medicine

## 2016-09-13 NOTE — Telephone Encounter (Signed)
Called patient to inform her that both Dr. Sharlet Salina and [name], PA agreed to the provider switch. Also informed pt that she will need to call [facility] office to schedule a transfer appt. Changed PCP in pt's chart.

## 2016-09-15 ENCOUNTER — Other Ambulatory Visit: Payer: Self-pay | Admitting: Internal Medicine

## 2016-09-23 ENCOUNTER — Other Ambulatory Visit: Payer: Self-pay | Admitting: Cardiology

## 2016-09-26 ENCOUNTER — Other Ambulatory Visit: Payer: Self-pay | Admitting: Medical

## 2016-10-01 ENCOUNTER — Other Ambulatory Visit: Payer: Self-pay | Admitting: Cardiovascular Disease

## 2016-10-08 ENCOUNTER — Other Ambulatory Visit: Payer: Self-pay | Admitting: Cardiology

## 2016-10-08 DIAGNOSIS — I5032 Chronic diastolic (congestive) heart failure: Secondary | ICD-10-CM

## 2016-10-08 DIAGNOSIS — I739 Peripheral vascular disease, unspecified: Secondary | ICD-10-CM

## 2016-10-08 DIAGNOSIS — I6523 Occlusion and stenosis of bilateral carotid arteries: Secondary | ICD-10-CM

## 2016-10-09 ENCOUNTER — Other Ambulatory Visit: Payer: Self-pay | Admitting: Medical

## 2016-11-02 ENCOUNTER — Ambulatory Visit (INDEPENDENT_AMBULATORY_CARE_PROVIDER_SITE_OTHER): Payer: Medicare Other | Admitting: Pharmacist Clinician (PhC)/ Clinical Pharmacy Specialist

## 2016-11-02 DIAGNOSIS — I6523 Occlusion and stenosis of bilateral carotid arteries: Secondary | ICD-10-CM

## 2016-11-02 DIAGNOSIS — Z952 Presence of prosthetic heart valve: Secondary | ICD-10-CM | POA: Diagnosis not present

## 2016-11-02 DIAGNOSIS — Z7901 Long term (current) use of anticoagulants: Secondary | ICD-10-CM

## 2016-11-02 LAB — POCT INR: INR: 3.5

## 2016-11-05 ENCOUNTER — Other Ambulatory Visit: Payer: Self-pay | Admitting: Cardiovascular Disease

## 2016-11-09 ENCOUNTER — Other Ambulatory Visit: Payer: Self-pay | Admitting: Cardiology

## 2016-11-09 ENCOUNTER — Other Ambulatory Visit: Payer: Self-pay | Admitting: Internal Medicine

## 2016-11-09 ENCOUNTER — Other Ambulatory Visit: Payer: Self-pay | Admitting: Medical

## 2016-11-09 NOTE — Telephone Encounter (Signed)
Has yet to establish with Percell Miller. Encourage to do so if she intends to change. 30 day refill. Provided.

## 2016-11-09 NOTE — Telephone Encounter (Signed)
Patient advised to let dr Oletta Lamas office know about refills in future---30 day supply sent in for now

## 2016-11-09 NOTE — Telephone Encounter (Signed)
Routing to greg, dr crawford has prescribed this med in dec/2017, but patient is showing another pcp---please advise, thanks

## 2016-11-10 ENCOUNTER — Other Ambulatory Visit: Payer: Self-pay | Admitting: Internal Medicine

## 2016-11-10 ENCOUNTER — Other Ambulatory Visit: Payer: Self-pay

## 2016-11-10 MED ORDER — METOPROLOL SUCCINATE ER 50 MG PO TB24: ORAL_TABLET | ORAL | 10 refills | Status: DC

## 2016-11-11 ENCOUNTER — Other Ambulatory Visit: Payer: Self-pay

## 2016-11-11 NOTE — Progress Notes (Signed)
She would need to see pcp

## 2016-11-11 NOTE — Progress Notes (Signed)
Pt requesting Trazodone has not been prescribed by PCP. Please Advise.

## 2016-11-16 NOTE — Progress Notes (Signed)
Pt states she will call tomorrow and make an appointment to see PCP

## 2016-11-17 ENCOUNTER — Ambulatory Visit (HOSPITAL_COMMUNITY): Payer: Self-pay

## 2016-11-22 ENCOUNTER — Encounter (HOSPITAL_COMMUNITY): Payer: Self-pay

## 2016-11-22 ENCOUNTER — Telehealth: Payer: Self-pay | Admitting: Medical

## 2016-11-22 ENCOUNTER — Ambulatory Visit (HOSPITAL_COMMUNITY)
Admission: RE | Admit: 2016-11-22 | Discharge: 2016-11-22 | Disposition: A | Discharge status: Home / Self Care | Payer: Medicare Other | Source: Ambulatory Visit | Attending: Cardiology | Admitting: Cardiology

## 2016-11-22 VITALS — BP 124/62 | HR 80 | Wt 188.0 lb

## 2016-11-22 DIAGNOSIS — G4733 Obstructive sleep apnea (adult) (pediatric): Secondary | ICD-10-CM | POA: Diagnosis not present

## 2016-11-22 DIAGNOSIS — Z7901 Long term (current) use of anticoagulants: Secondary | ICD-10-CM | POA: Insufficient documentation

## 2016-11-22 DIAGNOSIS — Z8249 Family history of ischemic heart disease and other diseases of the circulatory system: Secondary | ICD-10-CM | POA: Diagnosis not present

## 2016-11-22 DIAGNOSIS — I6521 Occlusion and stenosis of right carotid artery: Secondary | ICD-10-CM | POA: Diagnosis not present

## 2016-11-22 DIAGNOSIS — I5032 Chronic diastolic (congestive) heart failure: Secondary | ICD-10-CM | POA: Insufficient documentation

## 2016-11-22 DIAGNOSIS — Z86010 Personal history of colonic polyps: Secondary | ICD-10-CM | POA: Insufficient documentation

## 2016-11-22 DIAGNOSIS — E785 Hyperlipidemia, unspecified: Secondary | ICD-10-CM | POA: Diagnosis not present

## 2016-11-22 DIAGNOSIS — I739 Peripheral vascular disease, unspecified: Secondary | ICD-10-CM | POA: Diagnosis not present

## 2016-11-22 DIAGNOSIS — I350 Nonrheumatic aortic (valve) stenosis: Secondary | ICD-10-CM | POA: Diagnosis not present

## 2016-11-22 DIAGNOSIS — K219 Gastro-esophageal reflux disease without esophagitis: Secondary | ICD-10-CM | POA: Insufficient documentation

## 2016-11-22 DIAGNOSIS — E119 Type 2 diabetes mellitus without complications: Secondary | ICD-10-CM | POA: Diagnosis not present

## 2016-11-22 DIAGNOSIS — R918 Other nonspecific abnormal finding of lung field: Secondary | ICD-10-CM | POA: Insufficient documentation

## 2016-11-22 DIAGNOSIS — Z952 Presence of prosthetic heart valve: Secondary | ICD-10-CM

## 2016-11-22 DIAGNOSIS — Z951 Presence of aortocoronary bypass graft: Secondary | ICD-10-CM | POA: Diagnosis not present

## 2016-11-22 DIAGNOSIS — I110 Hypertensive heart disease with heart failure: Secondary | ICD-10-CM | POA: Insufficient documentation

## 2016-11-22 DIAGNOSIS — I2510 Atherosclerotic heart disease of native coronary artery without angina pectoris: Secondary | ICD-10-CM | POA: Diagnosis not present

## 2016-11-22 LAB — COMPREHENSIVE METABOLIC PANEL
ALT: 23 U/L (ref 14–54)
AST: 34 U/L (ref 15–41)
Albumin: 4.2 g/dL (ref 3.5–5.0)
Alkaline Phosphatase: 69 U/L (ref 38–126)
Anion gap: 10 (ref 5–15)
BUN: 16 mg/dL (ref 6–20)
CO2: 28 mmol/L (ref 22–32)
Calcium: 9.4 mg/dL (ref 8.9–10.3)
Chloride: 100 mmol/L — ABNORMAL LOW (ref 101–111)
Creatinine, Ser: 1.3 mg/dL — ABNORMAL HIGH (ref 0.44–1.00)
GFR calc Af Amer: 47 mL/min — ABNORMAL LOW (ref >60)
GFR calc non Af Amer: 40 mL/min — ABNORMAL LOW (ref >60)
Glucose, Bld: 105 mg/dL — ABNORMAL HIGH (ref 65–99)
Potassium: 3.9 mmol/L (ref 3.5–5.1)
Sodium: 138 mmol/L (ref 135–145)
Total Bilirubin: 1.2 mg/dL (ref 0.3–1.2)
Total Protein: 7.3 g/dL (ref 6.5–8.1)

## 2016-11-22 LAB — LIPID PANEL
Cholesterol: 207 mg/dL — ABNORMAL HIGH (ref 0–200)
HDL: 40 mg/dL — ABNORMAL LOW (ref >40)
LDL Cholesterol: 126 mg/dL — ABNORMAL HIGH (ref 0–99)
Total CHOL/HDL Ratio: 5.2 RATIO
Triglycerides: 206 mg/dL — ABNORMAL HIGH (ref <150)
VLDL: 41 mg/dL — ABNORMAL HIGH (ref 0–40)

## 2016-11-22 MED ORDER — METOPROLOL SUCCINATE ER 50 MG PO TB24: 50.0000 mg | ORAL_TABLET | Freq: Every day | ORAL | 3 refills | Status: DC

## 2016-11-22 MED ORDER — TORSEMIDE 20 MG PO TABS: ORAL_TABLET | ORAL | 3 refills | Status: DC

## 2016-11-22 MED ORDER — POTASSIUM CHLORIDE ER 20 MEQ PO TBCR: EXTENDED_RELEASE_TABLET | ORAL | 3 refills | Status: DC

## 2016-11-22 NOTE — Patient Instructions (Signed)
Labs today (will call for abnormal results, otherwise no news is good news)  Echocardiogram has been ordered for you, we will schedule this at checkout.  Refills sent to pharmacy   Follow up in 4 Months with Dr. [name], we will call you to schedule appointment.

## 2016-11-22 NOTE — Progress Notes (Signed)
Patient ID: Abigail Oliver, female   DOB: 11/22/1945, 71 y.o.   MRN: [id number] PCP: Dr. Sharlet Salina  71 yo with history of aortic stenosis s/p St Jude mechanical AVR (6/00) and CAD s/p single vessel CABG (6/00) presents for cardiology evaluation.  She had severe aortic stenosis and therefore required CABG-AVR in 6/00.  She had a RIMA-RCA. She had a myoview in 4/13 with no ischemia or infarction, and echo in 6/14 showed normal EF with moderate diastolic dysfunction and a well-seated mechanical valve.    In 6/15, torsemide was stopped and she was begun on Lasix due to increased cost of torsemide.  She developed dyspnea and lower extremity edema.  She was admitted later that month with community-acquired PNA as well as CHF.  CT showed RLL PNA and multiple pulmonary nodules.  Echo in 6/15 showed EF 60-65%, mechanical aortic valve with mean gradient 27 mmHg. After discharge, she continued to be short of breath.  Lasix was increased to 80 mg bid.  This did not help much.  She was short of breath walking around in her yard.  No chest pain.  Lexiscan Cardiolite was done in 7/15 given the exertional symptoms and showed a small area of basal inferolateral ischemia.   Given ongoing exertional symptoms, Dr. [name] did a right and left heart catheterization in 7/15.  Right and left heart filling pressures were normal.  There was an 80% stenosis in the proximal LAD that was hemodynamically significant by FFR. Patient had DES to proximal LAD.    She has had left-sided arm and also leg weakness for a number of months. MRI head showed no CVA.  She was seen by neurology and c-spine arthritis was suspected for the arm weakness, but she never had the c-spine MRI done.    She has BPPV and was referred by neurology to vestibular rehab.   She returns today for HF follow up. Last seen in March 2017, she has been doing well. She works as a Emergency planning/management officer, usually walks about 10,000 steps a day without SOB. Her husband was  recently diagnosed with lung cancer. Eating some high salt foods, tries to watch her sugar intake due to diabetes. Drinking about 3L of fluid a day. She denies chest pain, dizziness, syncope and presyncope. She has struggled with anxiety recently, going to see her PCP later today to discuss. Weights at home are stable, 184-186 pounds. Her weight is only a pound up from the last time we saw her in 2017.     Labs (6/14): BNP 216, HCT 32, K 4.8, creatinine 0.8 Labs (8/14): K 4.4, creatinine 0.8, BNP 55, HDL 45, LDL 141 Labs (10/14): LDL 61, HDL 43, AST 40, ALT 31 Labs (6/15): BNP 37 Labs (7/15): K 4.4, creatinine 0.8 => 0.77 Labs (9/15): HCT 34.5, LDL 32, HDL 40 Labs (4/16): K 3.8, creatinine 0.96 Labs (5/16): K 4.4, creatinine 1.02, LDL 138 Labs (6/16): K 4.3, creatinine 1.34, BNP 83 Labs (9/16): TSH normal Labs (12/16): K 3.8, creatinine 1.05, BNP 87 Labs (2/17): BNP 35, K 4.6, creatinine 1.18, HCT 34.9  PMH: 1. Aortic stenosis: Now has St Jude mechanical aortic valve (6/00).  Echo (6/14) with EF 60-65%, mild LVH, moderate diastolic dysfunction, normal RV, mechanical aortic valve with mean gradient 20 mmHg.  Echo (6/15) with EF 60-65%, mechanical aortic valve with mean gradient 27 mmHg.  2. CAD: CABG at time of AVR in 6/00 with RIMA-RCA.  Myoview in 4/13 with EF 72%, no  ischemia or infarction. Lexiscan Cardiolite (7/15) with EF 69%, small area of basal inferolateral ischemia.  LHC (7/15) with patent RIMA-RCA, 80% mRCA, 80% pLAD with FFR 0.73, treated with DES to pLAD.  3. HTN 4. Type II diabetes 5. GERD 6. OSA 7. Hyperlipidemia 8. PAD: Right SFA stent. Peripheral arterial dopplers (7/15) with patent right SFA stent. Peripheral arterial dopplers (7/16) with right SFA stent patent.  9. Colon polyps 10. Carotid stenosis: Carotid dopplers (11/13) witih 50-69% RICA.  Carotid dopplers (7/15) with 40-59% RICA. Carotid dopplers (7/16) with 40-59% BICA stenosis.  11. Diastolic CHF 12.  Gastritis 13. Dizziness: Holter (8/14) with few PACs, otherwise unremarkable.  Event monitor (2/17) with no significant arrhythmias.  Dizziness is probably due to BPPV.  14. Mild transaminase elevation 15. Pulmonary nodules: Noted by CT in 6/15.  CT chest (8/15) was thought to indicate that nodules were benign.  No further workup was recommended.  16. Contrast allergy 17. Diastolic CHF: RHC (0/32) with mean RA 2, PA 22/11, mean PCWP 9, CI 3.79 18. Low back pain. 19. Possible c-spine arthritis with pain down left arm.    SH: Married, works as Theme park manager, nonsmoker.   FH: CAD  ROS: All systems reviewed and negative except as per HPI.     BP 124/62 (BP Location: Left Arm, Patient Position: Sitting, Cuff Size: Normal)   Pulse 80   Wt 188 lb (85.3 kg)   SpO2 96%   BMI 34.39 kg/m  General: female, NAD.  Neck: No JVD., no thyromegaly or thyroid nodule.  Lungs: Clear bilaterally, normal effort.  CV: Nondisplaced PMI.  Heart regular S1/S2, Mechanical s2. No peripheral edema. No carotid bruit.  Normal pedal pulses.  Abdomen: Soft, non tender, non distended. no hepatosplenomegaly, Neurologic: Alert and oriented x 3.  Psych: Normal affect. Extremities: No clubbing or cyanosis.    Assessment/Plan: 1. St Jude mechanical aortic valve:  Mildly elevated mean gradient for this valve on echo in 6/15.  - Per Dr. Claris Gladden last note in 2017, she is due for repeat Echo to assess AoV gradient. Will schedule this.  - Continue coumadin and ASA  2. Chronic diastolic CHF: Echo 05/2246 EF 60-65%.  - NYHA II - Euvolemic on exam.  - Continue current doses of torsemide and KCl - CMET today.   3. CAD: s/p RIMA-RCA at time of AVR, DES to LAD in 11/2013.   Carl[facility] Adam myoview 2013 without ischemia - No signs or symptoms of ischemia - CMET today as she on statin.  - Continue Crestor, denies myalgias.    4. Hyperlipidemia: - Had mylagias with atorvastatin and simvastatin.  - Tolerating Crestor.  - Lipid  panel today.   5. Carotid stenosis: - Stable RICA stenosis 40-59%, needs repeat carotid dopplers. Will schedule.   6. PAD: Known PAD with right SFA stent.  Last peripheral arterial dopplers showed patent R SFA stent.   - Denies pain in legs with walking.   Follow up in 4 months with Dr. [name] Will schedule Echo and carotid dopplers.      Arbutus Leas, NP-C 11/22/2016

## 2016-11-22 NOTE — Telephone Encounter (Signed)
Relation to [id number] Call back [phone] Pharmacy: [facility], [place] [phone] (Phone) [phone] (Fax)     Reason for call:  Patient requesting imaging and mamo orders please send to [facility].  Patient requesting Rx for heat rash underneath her breast cream or antibiotics, please advise

## 2016-11-25 ENCOUNTER — Telehealth: Payer: Self-pay | Admitting: Medical

## 2016-11-25 DIAGNOSIS — Z1239 Encounter for other screening for malignant neoplasm of breast: Secondary | ICD-10-CM

## 2016-11-25 NOTE — Telephone Encounter (Signed)
Pt states that she does not get off until 6 today and tomorrow and will not be able to make it here today by 6:15.

## 2016-11-25 NOTE — Telephone Encounter (Signed)
Screening mammogram placed.

## 2016-11-25 NOTE — Telephone Encounter (Signed)
Opened to review 

## 2016-11-25 NOTE — Telephone Encounter (Signed)
Pt had question on rash under her arms and breast. She wants treatment. Probably needs antifungal but can't say for sure without seeing. Would you offer her appointmet today at 2:15 or tomorrow at 1 pm.

## 2016-11-26 ENCOUNTER — Telehealth: Payer: Self-pay | Admitting: Medical

## 2016-11-26 MED ORDER — NYSTATIN 100000 UNIT/GM EX CREA
1.0000 | TOPICAL_CREAM | Freq: Two times a day (BID) | CUTANEOUS | 1 refills | Status: DC
Start: 2016-11-26 — End: 2019-01-10

## 2016-11-26 NOTE — Telephone Encounter (Signed)
For axillary rash and rash under breast sent in nystatin cream. She could not come in but did send rx to pharmacy. Will call her and still advise her to be seen next week.

## 2016-11-26 NOTE — Telephone Encounter (Signed)
Let her now nystatin called in for her axillary and rash under breast but please schedule to be seen some time next week.

## 2016-11-29 NOTE — Telephone Encounter (Signed)
Left pt a message to call back. 

## 2016-11-30 ENCOUNTER — Other Ambulatory Visit: Payer: Self-pay | Admitting: Cardiovascular Disease

## 2016-12-02 ENCOUNTER — Telehealth (HOSPITAL_COMMUNITY): Payer: Self-pay | Admitting: Cardiology

## 2016-12-02 ENCOUNTER — Telehealth (HOSPITAL_COMMUNITY): Payer: Self-pay | Admitting: Vascular Surgery

## 2016-12-02 DIAGNOSIS — E119 Type 2 diabetes mellitus without complications: Secondary | ICD-10-CM | POA: Diagnosis not present

## 2016-12-02 DIAGNOSIS — Z961 Presence of intraocular lens: Secondary | ICD-10-CM | POA: Diagnosis not present

## 2016-12-02 DIAGNOSIS — I6523 Occlusion and stenosis of bilateral carotid arteries: Secondary | ICD-10-CM

## 2016-12-02 DIAGNOSIS — I739 Peripheral vascular disease, unspecified: Secondary | ICD-10-CM

## 2016-12-02 DIAGNOSIS — I5032 Chronic diastolic (congestive) heart failure: Secondary | ICD-10-CM

## 2016-12-02 DIAGNOSIS — H401131 Primary open-angle glaucoma, bilateral, mild stage: Secondary | ICD-10-CM | POA: Diagnosis not present

## 2016-12-02 MED ORDER — ROSUVASTATIN CALCIUM 20 MG PO TABS: ORAL_TABLET | ORAL | 3 refills | Status: DC

## 2016-12-02 NOTE — Telephone Encounter (Signed)
Left pt message to resh echo and make carotid appt on the same day

## 2016-12-02 NOTE — Telephone Encounter (Signed)
-----   Message from Arbutus Leas, NP sent at 11/23/2016  8:43 AM EDT ----- Will you make sure Abigail Oliver is taking her Crestor? If she is not, please ask her to restart. If she is, please have her increase her Crestor to 40 mg daily? Also after reviewing her chart she is due to carotid dopplers, will you schedule for her?

## 2016-12-02 NOTE — Telephone Encounter (Signed)
Patient aware. Patient voiced understanding Reports she was not taking crestor, will refill med x 12 months Order placed for carotid and will arrange echo for the same day

## 2016-12-06 ENCOUNTER — Other Ambulatory Visit: Payer: Self-pay | Admitting: Cardiology

## 2016-12-06 ENCOUNTER — Other Ambulatory Visit: Payer: Self-pay | Admitting: Internal Medicine

## 2016-12-06 DIAGNOSIS — I739 Peripheral vascular disease, unspecified: Secondary | ICD-10-CM

## 2016-12-06 DIAGNOSIS — I6523 Occlusion and stenosis of bilateral carotid arteries: Secondary | ICD-10-CM

## 2016-12-06 DIAGNOSIS — I5032 Chronic diastolic (congestive) heart failure: Secondary | ICD-10-CM

## 2016-12-09 ENCOUNTER — Telehealth: Payer: Self-pay | Admitting: Medical

## 2016-12-09 ENCOUNTER — Other Ambulatory Visit: Payer: Self-pay | Admitting: Medical

## 2016-12-09 ENCOUNTER — Other Ambulatory Visit: Payer: Self-pay | Admitting: Family

## 2016-12-09 NOTE — Telephone Encounter (Signed)
Celexa rx denied by Terri Piedra NP at other office. Will you call pt and see how she is doing. Not sure why rx sent to him? Will you touch base with pt. Has she been on med for a while. How is her mood. If has been on in past and doing well document and can refill 30 tabs with 2 refills. Let me know what she says.

## 2016-12-09 NOTE — Telephone Encounter (Signed)
Opened to review 

## 2016-12-10 MED ORDER — CITALOPRAM HYDROBROMIDE 20 MG PO TABS: ORAL_TABLET | ORAL | 2 refills | Status: DC

## 2016-12-10 NOTE — Addendum Note (Signed)
Addended by: Hinton Dyer on: 12/10/2016 09:57 AM   Modules accepted: Orders

## 2016-12-10 NOTE — Telephone Encounter (Signed)
Pt states that her mood is much better when she's on the Celexa

## 2016-12-12 ENCOUNTER — Emergency Department (HOSPITAL_BASED_OUTPATIENT_CLINIC_OR_DEPARTMENT_OTHER)
Admission: EM | Admit: 2016-12-12 | Discharge: 2016-12-12 | Disposition: A | Discharge status: Home / Self Care | Payer: Medicare Other | Attending: Emergency Medicine | Admitting: Emergency Medicine

## 2016-12-12 ENCOUNTER — Emergency Department (HOSPITAL_BASED_OUTPATIENT_CLINIC_OR_DEPARTMENT_OTHER): Payer: Medicare Other

## 2016-12-12 ENCOUNTER — Encounter (HOSPITAL_BASED_OUTPATIENT_CLINIC_OR_DEPARTMENT_OTHER): Payer: Self-pay | Admitting: Emergency Medicine

## 2016-12-12 DIAGNOSIS — B9789 Other viral agents as the cause of diseases classified elsewhere: Secondary | ICD-10-CM

## 2016-12-12 DIAGNOSIS — Z7984 Long term (current) use of oral hypoglycemic drugs: Secondary | ICD-10-CM | POA: Insufficient documentation

## 2016-12-12 DIAGNOSIS — Z952 Presence of prosthetic heart valve: Secondary | ICD-10-CM | POA: Insufficient documentation

## 2016-12-12 DIAGNOSIS — I5032 Chronic diastolic (congestive) heart failure: Secondary | ICD-10-CM | POA: Insufficient documentation

## 2016-12-12 DIAGNOSIS — I110 Hypertensive heart disease with heart failure: Secondary | ICD-10-CM | POA: Diagnosis not present

## 2016-12-12 DIAGNOSIS — Z7901 Long term (current) use of anticoagulants: Secondary | ICD-10-CM | POA: Insufficient documentation

## 2016-12-12 DIAGNOSIS — R05 Cough: Secondary | ICD-10-CM | POA: Diagnosis not present

## 2016-12-12 DIAGNOSIS — Z7982 Long term (current) use of aspirin: Secondary | ICD-10-CM | POA: Insufficient documentation

## 2016-12-12 DIAGNOSIS — Z79899 Other long term (current) drug therapy: Secondary | ICD-10-CM | POA: Diagnosis not present

## 2016-12-12 DIAGNOSIS — E119 Type 2 diabetes mellitus without complications: Secondary | ICD-10-CM | POA: Diagnosis not present

## 2016-12-12 DIAGNOSIS — Z951 Presence of aortocoronary bypass graft: Secondary | ICD-10-CM | POA: Insufficient documentation

## 2016-12-12 DIAGNOSIS — J069 Acute upper respiratory infection, unspecified: Secondary | ICD-10-CM | POA: Diagnosis not present

## 2016-12-12 DIAGNOSIS — I2510 Atherosclerotic heart disease of native coronary artery without angina pectoris: Secondary | ICD-10-CM | POA: Insufficient documentation

## 2016-12-12 LAB — BASIC METABOLIC PANEL
Anion gap: 12 (ref 5–15)
BUN: 17 mg/dL (ref 6–20)
CO2: 28 mmol/L (ref 22–32)
Calcium: 9.2 mg/dL (ref 8.9–10.3)
Chloride: 99 mmol/L — ABNORMAL LOW (ref 101–111)
Creatinine, Ser: 1.18 mg/dL — ABNORMAL HIGH (ref 0.44–1.00)
GFR calc Af Amer: 52 mL/min — ABNORMAL LOW (ref >60)
GFR calc non Af Amer: 45 mL/min — ABNORMAL LOW (ref >60)
Glucose, Bld: 190 mg/dL — ABNORMAL HIGH (ref 65–99)
Potassium: 3.9 mmol/L (ref 3.5–5.1)
Sodium: 139 mmol/L (ref 135–145)

## 2016-12-12 LAB — PROTIME-INR
INR: 3.29
Prothrombin Time: 34.2 seconds — ABNORMAL HIGH (ref 11.4–15.2)

## 2016-12-12 LAB — TROPONIN I: Troponin I: <0.03 ng/mL (ref <0.03)

## 2016-12-12 LAB — CBC
HCT: 33.7 % — ABNORMAL LOW (ref 36.0–46.0)
Hemoglobin: 10.8 g/dL — ABNORMAL LOW (ref 12.0–15.0)
MCH: 29.1 pg (ref 26.0–34.0)
MCHC: 32 g/dL (ref 30.0–36.0)
MCV: 90.8 fL (ref 78.0–100.0)
Platelets: 133 10*3/uL — ABNORMAL LOW (ref 150–400)
RBC: 3.71 MIL/uL — ABNORMAL LOW (ref 3.87–5.11)
RDW: 15.3 % (ref 11.5–15.5)
WBC: 4.8 10*3/uL (ref 4.0–10.5)

## 2016-12-12 LAB — BRAIN NATRIURETIC PEPTIDE: B Natriuretic Peptide: 71.3 pg/mL (ref 0.0–100.0)

## 2016-12-12 MED ORDER — BENZONATATE 200 MG PO CAPS: 200.0000 mg | ORAL_CAPSULE | Freq: Three times a day (TID) | ORAL | 0 refills | Status: DC | PRN

## 2016-12-12 NOTE — Discharge Instructions (Signed)
Return to ER if you have any severe shortness of breath, fevers, chest pain, or other alarming symptoms.

## 2016-12-12 NOTE — ED Provider Notes (Signed)
[facility] Provider Note   CSN: [id number] Arrival date & time: 12/12/16  1108     History   Chief Complaint Chief Complaint  Patient presents with  . Cough    HPI Abigail Oliver is a 71 y.o. female.  71yo F w/ PMH including CHF, CAD, aortic valve replacement on coumadin, HTN, T2DM who p/w cough. Patient reports a 2 to three-day history of cough associated with nasal congestion and sore throat. For other family members have had the same symptoms, who she spent time with recently at the beach. She reports that she always has shortness of breath that has been the same recently, and she sleeps elevated at night but son is concerned that her shortness of breath may be worse recently. She does not check daily weights but notes that she has not had any worsening lower extremity edema. She does however feel that she does not urinate much after she takes doses of torsemide. She denies any associated chest pain, fevers, vomiting, diarrhea, or urinary symptoms.   The history is provided by the patient and a relative.  Cough     Past Medical History:  Diagnosis Date  . Anemia   . Anxiety   . Aortic stenosis    a. Now has St Jude mechanical aortic valve (6/00).b. Echo (6/15) with EF 60-65%, mechanical aortic valve with mean gradient 27 mmHg.  . Arthritis    a. Possible c-spine arthritis with pain down left arm.   . Carotid artery disease ([place])    a. Carotid dopplers (7/16) with 40-59% BICA stenosis.   . Chronic angle-closure glaucoma(365.23)   . Chronic diastolic CHF (congestive heart failure) ([place])    a.  RHC (7/15) with mean RA 2, PA 22/11, mean PCWP 9, CI 3.79.  Marland Kitchen Colon polyps   . Contrast media allergy   . Coronary atherosclerosis of native coronary artery    a. CABG at time of AVR in 6/00 with RIMA-RCA.b. Abnl nuc 11/2013 -> LHC (7/15) with patent RIMA-RCA, 80% mRCA, 80% pLAD with FFR 0.73, treated with DES to pLAD.  Marland Kitchen Depression   . Dizziness 02/09/2012   a.  Holter (8/14) with few PACs, otherwise unremarkable.   . Elevated transaminase level   . Essential hypertension   . Gastritis   . GERD (gastroesophageal reflux disease)   . Hyperlipidemia   . Low back pain   . Mechanical heart valve present   . Neuromuscular disorder (HCC)    neuropathy  . Peripheral vascular disease ([place])    a, H/o Right SFA stent. b. Peripheral arterial dopplers (7/16) with right SFA stent patent.   Marland Kitchen PONV (postoperative nausea and vomiting)    N&V  . Pulmonary nodules    a. Noted by CT in 6/15. CT chest (8/15) was thought to indicate that nodules were benign. No further workup was recommended.   . Syncope 03/29/2012   carotid doppler - R ICA 50-69% reduction by velocities (low end); L ICA 0-49% reduction by velocities (low end); R and L subclavian arteries - <50% redcution; R and L vertebral arteries show normal antegrade flow  . Type II diabetes mellitus [facility])     Patient Active Problem List   Diagnosis Date Noted  . Benign paroxysmal positional vertigo 07/14/2015  . Dizziness and giddiness 07/14/2015  . Diabetic polyneuropathy associated with type 2 diabetes mellitus ([place]) 07/14/2015  . Spinal stenosis, lumbar region, without neurogenic claudication 07/14/2015  . Cervical radiculopathy 07/14/2015  . Chest pain 07/07/2015  .  Angina pectoris ([place]) 07/07/2015  . Dizziness 05/14/2015  . Left-sided weakness 02/23/2015  . Acute sinusitis 01/29/2015  . Cough 08/29/2014  . Cerumen impaction 03/28/2014  . S/P arterial stent-to LAD, Promus DES 12/14/13 12/15/2013  . Pulmonary nodules/lesions, multiple 11/19/2013  . Rotator cuff syndrome of left shoulder 05/28/2013  . Chronic diastolic heart failure ([place]) 01/02/2013  . Anemia, iron deficiency 11/05/2012  . H/O prosthetic aortic valve replacement, St Jude 2000 09/21/2011  . Dyslipidemia 09/21/2011  . CAD, CABG 2000, low risk Myoview April 2011; 2015 + myoview 09/21/2011  . Diastolic dysfunction with NL LVF 2D  August 2012 09/21/2011  . Claudication ([place]) 09/21/2011  . PVD, Rt SFA PTA/Stent 09/20/11 09/21/2011  . Chronic anticoagulation 09/21/2011  . Nodule of neck 07/01/2011  . MIGRAINE, OPHTHALMIC 12/16/2009  . DEPRESSION, MAJOR, RECURRENT, MODERATE 12/15/2009  . OSTEOARTHRITIS, GENERALIZED, MULTIPLE JOINTS 12/30/2008  . Type II diabetes mellitus with peripheral autonomic neuropathy ([place]) 07/01/2008  . Chronic angle-closure glaucoma(365.23) 07/01/2008  . DISPLCMT LUMBAR INTERVERT [place] W/O MYELOPATHY 07/01/2008  . Calcaneal spur 07/01/2008  . OSTEOPOROSIS 07/01/2008    Past Surgical History:  Procedure Laterality Date  . BILATERAL OOPHORECTOMY  1987  . BREAST BIOPSY  2012   left  . CARDIAC CATHETERIZATION  01/14/1999   normal LV function, severe aortic stenosis; 80% and 70% stenosis in RCA; mild 20% distal norrowing in L main with 20% proximal LAD stenosis, 40% diagonal stenosis and 20% proximal circumflex stenosis  . CARDIAC VALVE REPLACEMENT  2000   aortic valve   . CARDIOVASCULAR STRESS TEST  08/25/2011   R/L MV - EF 72%; no scintigraphic evidence of inducible MI; normal perfusionTID of 1.25 elevated - could indicate small vessle subendocardial ischemia; EKG NSR at 66, non diagnostic for ischemia  . [place]   bilaterally  . CATARACT EXTRACTION W/ INTRAOCULAR LENS  IMPLANT, BILATERAL  ~ 2007  . [place]; 1971  . CHOLECYSTECTOMY  1990  . COLONOSCOPY N/A 10/27/2012   Procedure: COLONOSCOPY;  Surgeon: Beryle Beams, MD;  Location: [facility];  Service: Endoscopy;  Laterality: N/A;  . CORONARY ARTERY BYPASS GRAFT  2000   RIMA to RCA  . DOPPLER ECHOCARDIOGRAPHY  03/29/2012   EF >55%; mild concentric LVH; stage 1 diastolic dysfunction, elevated LV filling pressure, dilated LA; MAC mild MR; St Jude AVR peak and mean gradients of 16mHg and 246mg; transvalvular gradients have increased (prev 23 and 14 respectively)  . ESOPHAGOGASTRODUODENOSCOPY N/A 10/27/2012    Procedure: ESOPHAGOGASTRODUODENOSCOPY (EGD);  Surgeon: PaBeryle BeamsMD;  [facility];  Service: Endoscopy;  Laterality: N/A;  . FEMORAL ARTERY STENT  09/20/2011   6 x 40 Smart Nitinol self-expanding stent placed;  10/15/2031 -R SFA stent open and patent w/o evidence of restenosis  . FRACTIONAL FLOW RESERVE WIRE  12/14/2013   Procedure: FRACTIONAL FLOW RESERVE WIRE;  Surgeon: DaLarey DresserMD;  [facility];  Service: Cardiovascular;;  . LACERATION REPAIR     right hand  . LEFT AND RIGHT HEART CATHETERIZATION WITH CORONARY ANGIOGRAM N/A 12/14/2013   Procedure: LEFT AND RIGHT HEART CATHETERIZATION WITH CORONARY ANGIOGRAM;  Surgeon: DaLarey DresserMD;  [facility];  Service: Cardiovascular;  Laterality: N/A;  . LOWER EXTREMITY ANGIOGRAM N/A 09/20/2011   Procedure: LOWER EXTREMITY ANGIOGRAM;  Surgeon: JoLorretta HarpMD;  [facility];  Service: Cardiovascular;  Laterality: N/A;  . LYMPH NODE BIOPSY  06/2011   "core needle on 5"  .  needle biopsy  2009   "on ankles for nerve damage"  . NEUROPLASTY / TRANSPOSITION ULNAR NERVE AT ELBOW     right  . PERCUTANEOUS CORONARY STENT INTERVENTION (PCI-S)  12/14/2013   Procedure: PERCUTANEOUS CORONARY STENT INTERVENTION (PCI-S);  Surgeon: Larey Dresser, MD;  Location: [facility] CATH LAB;  Service: Cardiovascular;;  Prox LAD 3.00x12 Promus DES   . PERIPHERAL ARTERIAL STENT GRAFT  09/20/11   right SFA  . REFRACTIVE SURGERY  ` 2004   "for glaucoma"  . TONSILLECTOMY  1968  . TRIGGER FINGER RELEASE Left 12/04/2015   Procedure: LEFT RING FINGER TRIGGER RELEASE;  Surgeon: Leanora Cover, MD;  Location: [place] [facility];  Service: Orthopedics;  Laterality: Left;  Marland Kitchen VAGINAL HYSTERECTOMY  1985   Fibroids    OB History    No data available       Home Medications    Prior to Admission medications   Medication Sig Start Date End Date Taking? Authorizing Provider  Ascorbic Acid (VITAMIN C) 500 MG tablet Take 500 mg  by mouth 2 (two) times daily.     [provider]  aspirin EC 81 MG tablet Take 1 tablet (81 mg total) by mouth daily. 02/21/15   Larey Dresser, MD  benzonatate (TESSALON) 200 MG capsule Take 1 capsule (200 mg total) by mouth 3 (three) times daily as needed for cough. 12/12/16   Jeriah Skufca, Wenda Overland, MD  calcium-vitamin D (CALCIUM 500+D) 500-200 MG-UNIT per tablet Take 1 tablet by mouth 2 (two) times daily.     [provider]  citalopram (CELEXA) 20 MG tablet TAKE 1 TABLET (20 MG TOTAL) BY MOUTH AT BEDTIME. 12/10/16   Saguier, Percell Miller, PA-C  ferrous sulfate 325 (65 FE) MG tablet Take 1 tablet (325 mg total) by mouth daily. 10/28/13   Pattricia Boss, MD  folic acid (FOLVITE) 1 MG tablet Take 1 mg by mouth daily.    [provider]  latanoprost (XALATAN) 0.005 % ophthalmic solution PLACE 1 DROP INTO BOTH EYES AT BEDTIME. 07/25/14   Hoyt Koch, MD  metFORMIN (GLUCOPHAGE) 1000 MG tablet TAKE 1 TABLET (1,000 MG TOTAL) BY MOUTH 2 (TWO) TIMES DAILY WITH A MEAL. 02/09/16   Hoyt Koch, MD  metoprolol succinate (TOPROL-XL) 50 MG 24 hr tablet Take 1 tablet (50 mg total) by mouth daily. TAKE 1 TABLET (50 MG TOTAL) BY MOUTH DAILY. 11/22/16   Larey Dresser, MD  nitroGLYCERIN (NITROSTAT) 0.4 MG SL tablet Place 1 tablet (0.4 mg total) under the tongue every 5 (five) minutes as needed for chest pain. Patient not taking: Reported on 11/22/2016 12/15/13   Isaiah Serge, NP  nystatin cream (MYCOSTATIN) Apply 1 application topically 2 (two) times daily. 11/26/16   Saguier, Percell Miller, PA-C  pantoprazole (PROTONIX) 40 MG tablet Take 1 tablet (40 mg total) by mouth daily. 12/07/16   Bensimhon, Shaune Pascal, MD  Potassium Chloride ER 20 MEQ TBCR Take 60 mEq (3 Tablets) in the AM and 40 mEq (2 Tablets) in the PM 11/22/16   Larey Dresser, MD  rosuvastatin (CRESTOR) 20 MG tablet Take 1 tablet by mouth twice weekly. 12/02/16   Arbutus Leas, NP  rosuvastatin (CRESTOR) 20 MG tablet TAKE 1 TABLET  BY MOUTH TWICE WEEKLY 12/07/16   Larey Dresser, MD  torsemide (DEMADEX) 20 MG tablet Take 60 mg (3 Tablets) in the AM and 40 mg (2 Tablets) in the PM. 11/22/16   Larey Dresser, MD  traZODone (DESYREL) 50  MG tablet TAKE 0.5-1 TABLETS (25-50 MG TOTAL) BY MOUTH AT BEDTIME AS NEEDED FOR SLEEP. 04/28/16   Hoyt Koch, MD  warfarin (COUMADIN) 3 MG tablet Tale 1/2 to 1 tablet daily as directed by coumadin clinic 12/01/16   Troy Sine, MD    Family History Family History  Problem Relation Age of Onset  . Colon cancer Mother   . COPD Mother   . Emphysema Mother   . Cancer Maternal Grandmother   . Cancer Maternal Grandfather   . Heart disease Brother   . Diabetes Neg Hx     Social History Social History  Substance Use Topics  . Smoking status: Never Smoker  . Smokeless tobacco: Never Used  . Alcohol use No     Allergies   Atorvastatin; Simvastatin; Sulfa antibiotics; and Iodinated diagnostic agents   Review of Systems Review of Systems  Respiratory: Positive for cough.    All other systems reviewed and are negative except that which was mentioned in HPI   Physical Exam Updated Vital Signs BP (!) 130/52 (BP Location: Right Arm)   Pulse 66   Temp 98.4 F (36.9 C) (Oral)   Resp 18   Ht 5' 2"  (1.575 m)   Wt 83.7 kg (184 lb 8.4 oz)   SpO2 100%   BMI 33.75 kg/m   Physical Exam  Constitutional: She is oriented to person, place, and time. She appears well-developed and well-nourished. No distress.  HENT:  Head: Normocephalic and atraumatic.  Mouth/Throat: Oropharynx is clear and moist. No oropharyngeal exudate.  Moist mucous membranes  Eyes: Pupils are equal, round, and reactive to light. Conjunctivae are normal.  Neck: Neck supple.  Cardiovascular: Normal rate and regular rhythm.   Murmur heard. Pulmonary/Chest: Effort normal and breath sounds normal.  Abdominal: Soft. Bowel sounds are normal. She exhibits no distension. There is no tenderness.    Musculoskeletal: She exhibits no edema.  Neurological: She is alert and oriented to person, place, and time.  Fluent speech  Skin: Skin is warm and dry.  Psychiatric: She has a normal mood and affect. Judgment normal.  Nursing note and vitals reviewed.    ED Treatments / Results  Labs (all labs ordered are listed, but only abnormal results are displayed) Labs Reviewed  BASIC METABOLIC PANEL - Abnormal; Notable for the following:       Result Value   Chloride 99 (*)    Glucose, Bld 190 (*)    Creatinine, Ser 1.18 (*)    GFR calc non Af Amer 45 (*)    GFR calc Af Amer 52 (*)    All other components within normal limits  CBC - Abnormal; Notable for the following:    RBC 3.71 (*)    Hemoglobin 10.8 (*)    HCT 33.7 (*)    Platelets 133 (*)    All other components within normal limits  PROTIME-INR - Abnormal; Notable for the following:    Prothrombin Time 34.2 (*)    All other components within normal limits  BRAIN NATRIURETIC PEPTIDE  TROPONIN I    EKG  EKG Interpretation None       Radiology Dg Chest 2 View  Result Date: 12/12/2016 CLINICAL DATA:  Cough and congestion 2 days. EXAM: CHEST  2 VIEW COMPARISON:  03/29/2016 FINDINGS: Sternotomy wires unchanged. Lungs are adequately inflated without focal consolidation or effusion. Cardiomediastinal silhouette is within normal. There are degenerative changes of the spine. Subtle anterior wedging of an upper thoracic vertebral body which  is new. IMPRESSION: No acute cardiopulmonary disease. Mild anterior wedging of an upper thoracic vertebral body new since the previous exam. Electronically Signed   By: Marin Olp M.D.   On: 12/12/2016 11:45    Procedures Procedures (including critical care time)  Medications Ordered in ED Medications - No data to display   Initial Impression / Assessment and Plan / ED Course  I have reviewed the triage vital signs and the nursing notes.  Pertinent labs & imaging results that were  available during my care of the patient were reviewed by me and considered in my medical decision making (see chart for details).     PT w/ h/o CHF p/w Several days of cough associated with sore throat and nasal congestion. Son concerned because of her history of CHF. She was comfortable on exam with reassuring vital signs, normal O2 saturation on room air. Clear breath sounds, no lower extremity edema or signs of volume overload. Chest x-ray is clear. Labs show creatinine 1.18, BUN 17, normal BNP and troponin, WBC 4.8. Given her well appearance and reassuring lab work as well as clear chest x-ray, I doubt CHF exacerbation. She has multiple sick contacts with the same symptoms therefore I suspect viral URI. She appears well compensated and denies any complaints on my exam. I provided with medications to use for cough and discussed supportive measures. Reviewed return precautions. She and son voiced understanding and she was discharged in satisfactory condition.  Final Clinical Impressions(s) / ED Diagnoses   Final diagnoses:  Viral URI with cough    New Prescriptions Discharge Medication List as of 12/12/2016  2:04 PM    START taking these medications   Details  benzonatate (TESSALON) 200 MG capsule Take 1 capsule (200 mg total) by mouth 3 (three) times daily as needed for cough., Starting Sun 12/12/2016, Print         Skye Rodarte, Wenda Overland, MD 12/12/16 [phone]

## 2016-12-12 NOTE — ED Notes (Signed)
Patient transported to X-ray 

## 2016-12-12 NOTE — ED Triage Notes (Signed)
Patient states that she had a cough, with nasal congestion and sore throat x 2 -3 days

## 2016-12-13 ENCOUNTER — Ambulatory Visit (HOSPITAL_COMMUNITY): Payer: Medicare Other

## 2016-12-14 ENCOUNTER — Telehealth: Payer: Self-pay | Admitting: Cardiology

## 2016-12-14 NOTE — Telephone Encounter (Signed)
[place] calling needing a clarification on how the pt is taking Crestor. Please call back, [phone]. Thanks

## 2016-12-15 ENCOUNTER — Other Ambulatory Visit (HOSPITAL_COMMUNITY): Payer: Self-pay

## 2016-12-15 DIAGNOSIS — I6529 Occlusion and stenosis of unspecified carotid artery: Secondary | ICD-10-CM

## 2016-12-17 NOTE — Telephone Encounter (Signed)
2 Rx were sent to pt's pharmacy, pharmacy is requesting a clarification on directions, does pt takes crestor 40 mg tablet twice weekly or daily? Pharmacy would like a call back. Thanks

## 2016-12-20 ENCOUNTER — Ambulatory Visit (HOSPITAL_COMMUNITY)
Admission: RE | Admit: 2016-12-20 | Discharge: 2016-12-20 | Disposition: A | Discharge status: Home / Self Care | Payer: Medicare Other | Source: Ambulatory Visit | Attending: Cardiology | Admitting: Cardiology

## 2016-12-20 ENCOUNTER — Ambulatory Visit (HOSPITAL_BASED_OUTPATIENT_CLINIC_OR_DEPARTMENT_OTHER)
Admission: RE | Admit: 2016-12-20 | Discharge: 2016-12-20 | Disposition: A | Discharge status: Home / Self Care | Payer: Medicare Other | Source: Ambulatory Visit | Attending: Cardiology | Admitting: Cardiology

## 2016-12-20 DIAGNOSIS — I6529 Occlusion and stenosis of unspecified carotid artery: Secondary | ICD-10-CM

## 2016-12-20 DIAGNOSIS — I080 Rheumatic disorders of both mitral and aortic valves: Secondary | ICD-10-CM | POA: Diagnosis not present

## 2016-12-20 DIAGNOSIS — I5032 Chronic diastolic (congestive) heart failure: Secondary | ICD-10-CM | POA: Diagnosis not present

## 2016-12-20 DIAGNOSIS — I6523 Occlusion and stenosis of bilateral carotid arteries: Secondary | ICD-10-CM | POA: Diagnosis not present

## 2016-12-20 LAB — VAS US CAROTID
LEFT ECA DIAS: -3 cm/s
LEFT VERTEBRAL DIAS: 7 cm/s
Left CCA dist dias: -15 cm/s
Left CCA dist sys: -79 cm/s
Left CCA prox dias: 13 cm/s
Left CCA prox sys: 112 cm/s
Left ICA dist dias: -24 cm/s
Left ICA dist sys: -108 cm/s
Left ICA prox dias: -21 cm/s
Left ICA prox sys: -84 cm/s
RIGHT ECA DIAS: -7 cm/s
RIGHT VERTEBRAL DIAS: 5 cm/s
Right CCA prox dias: 4 cm/s
Right CCA prox sys: 88 cm/s
Right cca dist sys: -90 cm/s

## 2016-12-20 NOTE — Progress Notes (Signed)
*  PRELIMINARY RESULTS* Vascular Ultrasound Carotid Duplex (Doppler) has been completed.  Preliminary findings: Right 40-59% ICA stenosis. Left 1-39% ICA stenosis. Antegrade vertebral flow.   Landry Mellow, RDMS, RVT  12/20/2016, 2:29 PM

## 2016-12-20 NOTE — Progress Notes (Signed)
  Echocardiogram 2D Echocardiogram has been performed.  Jennette Dubin 12/20/2016, 3:39 PM

## 2016-12-27 ENCOUNTER — Telehealth (HOSPITAL_COMMUNITY): Payer: Self-pay

## 2016-12-27 NOTE — Telephone Encounter (Signed)
VAS US CAROTID  Order: [id number]  Status:  Final result Visible to patient:  Yes (MyChart) Dx:  Stenosis of carotid artery, unspecifi...  Notes recorded by Effie Berkshire, RN on 12/27/2016 at 2:13 PM EDT Patient aware and appreciative, advised to continue current med regimen and we will call to schedule f/u apt in October. ------  Notes recorded by Arbutus Leas, NP on 12/22/2016 at 1:21 PM EDT Please tell Abigail Oliver that her Echo is stable, heart function is normal and aortic valve is functioning well (has improved). Also, tell her that her carotid dopplers are stable and we will repeat in one year.

## 2016-12-28 ENCOUNTER — Telehealth: Payer: Self-pay | Admitting: *Deleted

## 2016-12-28 DIAGNOSIS — H903 Sensorineural hearing loss, bilateral: Secondary | ICD-10-CM | POA: Diagnosis not present

## 2016-12-28 NOTE — Telephone Encounter (Signed)
Received Physician Orders from [facility] [place], forwarded to provider/SLS 08/07

## 2016-12-30 ENCOUNTER — Other Ambulatory Visit: Payer: Self-pay | Admitting: Internal Medicine

## 2016-12-30 ENCOUNTER — Other Ambulatory Visit (HOSPITAL_COMMUNITY): Payer: Self-pay | Admitting: Cardiology

## 2016-12-30 DIAGNOSIS — I6523 Occlusion and stenosis of bilateral carotid arteries: Secondary | ICD-10-CM

## 2016-12-30 DIAGNOSIS — I5032 Chronic diastolic (congestive) heart failure: Secondary | ICD-10-CM

## 2016-12-30 DIAGNOSIS — I739 Peripheral vascular disease, unspecified: Secondary | ICD-10-CM

## 2016-12-30 MED ORDER — ROSUVASTATIN CALCIUM 20 MG PO TABS: 20.0000 mg | ORAL_TABLET | Freq: Every day | ORAL | 3 refills | Status: DC

## 2016-12-30 NOTE — Telephone Encounter (Signed)
Pt's medication was resent in to pt's pharmacy on 12/30/16, by CHF clinic.

## 2017-01-03 DIAGNOSIS — Z1231 Encounter for screening mammogram for malignant neoplasm of breast: Secondary | ICD-10-CM | POA: Diagnosis not present

## 2017-01-03 LAB — HM MAMMOGRAPHY

## 2017-01-05 ENCOUNTER — Ambulatory Visit (INDEPENDENT_AMBULATORY_CARE_PROVIDER_SITE_OTHER): Payer: Medicare Other | Admitting: Medical

## 2017-01-05 ENCOUNTER — Telehealth: Payer: Self-pay | Admitting: Medical

## 2017-01-05 ENCOUNTER — Encounter: Payer: Self-pay | Admitting: Medical

## 2017-01-05 VITALS — BP 119/66 | HR 72 | Temp 99.0°F | Resp 16 | Ht 62.0 in | Wt 188.2 lb

## 2017-01-05 DIAGNOSIS — L089 Local infection of the skin and subcutaneous tissue, unspecified: Secondary | ICD-10-CM | POA: Diagnosis not present

## 2017-01-05 DIAGNOSIS — I6523 Occlusion and stenosis of bilateral carotid arteries: Secondary | ICD-10-CM

## 2017-01-05 DIAGNOSIS — H6123 Impacted cerumen, bilateral: Secondary | ICD-10-CM

## 2017-01-05 MED ORDER — MUPIROCIN CALCIUM 2 % NA OINT: 1.0000 "application " | TOPICAL_OINTMENT | Freq: Two times a day (BID) | NASAL | 0 refills | Status: DC

## 2017-01-05 MED ORDER — CEPHALEXIN 500 MG PO CAPS: 500.0000 mg | ORAL_CAPSULE | Freq: Two times a day (BID) | ORAL | 0 refills | Status: DC

## 2017-01-05 NOTE — Telephone Encounter (Signed)
Patient checking on the status of bone density orders, patient requesting orders pleas send to [facility]

## 2017-01-05 NOTE — Telephone Encounter (Signed)
[place] called in to be advised she said that pt received a Rx for  mupirocin nasal ointment she would like to know if she make a change to Rx for pt?   Please advise   CB: [phone]

## 2017-01-05 NOTE — Progress Notes (Signed)
Subjective:    Patient ID: Abigail Oliver, female    DOB: 04/23/1946, 71 y.o.   MRN: [id number]  HPI  Pt in for ears feel clogged. Pt states hearing is decreased left side. Symptoms for 2-3 days. Went a clinic recently and failed lavage. So now here. No nasal congestoin preceding symptoms.  Also states her nostril feels tender on both side last few days.   Also mild dc to her medial canthus/eye area for one day. She feel like duct might be blocked.       Review of Systems  Constitutional: Negative for chills, fatigue and fever.  HENT: Positive for ear pain. Negative for sneezing, tinnitus and trouble swallowing.        Ear plugged sensation can't hear lt>rt.  Nose soreness boht sides.    Eyes:       Upper nose/adjacent to medial left eye faint soreness. Mild dc from eye intermittent from canthus area.  Respiratory: Negative for cough, chest tightness, shortness of breath and wheezing.   Cardiovascular: Negative for chest pain and palpitations.  Gastrointestinal: Negative for abdominal pain.  Musculoskeletal: Negative for back pain.  Skin: Negative for rash.  Neurological: Negative for dizziness, speech difficulty, weakness and headaches.  Hematological: Negative for adenopathy. Does not bruise/bleed easily.  Psychiatric/Behavioral: Negative for behavioral problems and confusion.   Past Medical History:  Diagnosis Date  . Anemia   . Anxiety   . Aortic stenosis    a. Now has St Jude mechanical aortic valve (6/00).b. Echo (6/15) with EF 60-65%, mechanical aortic valve with mean gradient 27 mmHg.  . Arthritis    a. Possible c-spine arthritis with pain down left arm.   . Carotid artery disease ([place])    a. Carotid dopplers (7/16) with 40-59% BICA stenosis.   . Chronic angle-closure glaucoma(365.23)   . Chronic diastolic CHF (congestive heart failure) ([place])    a.  RHC (7/15) with mean RA 2, PA 22/11, mean PCWP 9, CI 3.79.  Marland Kitchen Colon polyps   . Contrast media allergy     . Coronary atherosclerosis of native coronary artery    a. CABG at time of AVR in 6/00 with RIMA-RCA.b. Abnl nuc 11/2013 -> LHC (7/15) with patent RIMA-RCA, 80% mRCA, 80% pLAD with FFR 0.73, treated with DES to pLAD.  Marland Kitchen Depression   . Dizziness 02/09/2012   a. Holter (8/14) with few PACs, otherwise unremarkable.   . Elevated transaminase level   . Essential hypertension   . Gastritis   . GERD (gastroesophageal reflux disease)   . Hyperlipidemia   . Low back pain   . Mechanical heart valve present   . Neuromuscular disorder (H[facility])    neuropathy  . Peripheral vascular disease ([place])    a, H/o Right SFA stent. b. Peripheral arterial dopplers (7/16) with right SFA stent patent.   Marland Kitchen PONV (postoperative nausea and vomiting)    N&V  . Pulmonary nodules    a. Noted by CT in 6/15. CT chest (8/15) was thought to indicate that nodules were benign. No further workup was recommended.   . Syncope 03/29/2012   carotid doppler - R ICA 50-69% reduction by velocities (low end); L ICA 0-49% reduction by velocities (low end); R and L subclavian arteries - <50% redcution; R and L vertebral arteries show normal antegrade flow  . Type II diabetes mellitus ([place])      Social History   Social History  . Marital status: Married    Spouse name: [name]  .  Number of children: 2  . Years of education: 14   Occupational History  .      hair dresser   Social History Main Topics  . Smoking status: Never Smoker  . Smokeless tobacco: Never Used  . Alcohol use No  . Drug use: No  . Sexual activity: No   Other Topics Concern  . Not on file   Social History Narrative   Pt is a high school graduate with 2 years of college. Married in 1967 she has 1 son born 65 and 1 daughter born 22 and 1 grandchild. 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      Caffeine use- 2-3 /day, soda, tea     Past Surgical History:  Procedure Laterality Date  . BILATERAL OOPHORECTOMY  1987  . BREAST  BIOPSY  2012   left  . CARDIAC CATHETERIZATION  01/14/1999   normal LV function, severe aortic stenosis; 80% and 70% stenosis in RCA; mild 20% distal norrowing in L main with 20% proximal LAD stenosis, 40% diagonal stenosis and 20% proximal circumflex stenosis  . CARDIAC VALVE REPLACEMENT  2000   aortic valve   . CARDIOVASCULAR STRESS TEST  08/25/2011   R/L MV - EF 72%; no scintigraphic evidence of inducible MI; normal perfusionTID of 1.25 elevated - could indicate small vessle subendocardial ischemia; EKG NSR at 66, non diagnostic for ischemia  . [place]   bilaterally  . CATARACT EXTRACTION W/ INTRAOCULAR LENS  IMPLANT, BILATERAL  ~ 2007  . [place]; 1971  . CHOLECYSTECTOMY  1990  . COLONOSCOPY N/A 10/27/2012   Procedure: COLONOSCOPY;  Surgeon: Beryle Beams, MD;  Location: [facility];  Service: Endoscopy;  Laterality: N/A;  . CORONARY ARTERY BYPASS GRAFT  2000   RIMA to RCA  . DOPPLER ECHOCARDIOGRAPHY  03/29/2012   EF >55%; mild concentric LVH; stage 1 diastolic dysfunction, elevated LV filling pressure, dilated LA; MAC mild MR; St Jude AVR peak and mean gradients of 51mHg and 283mg; transvalvular gradients have increased (prev 23 and 14 respectively)  . ESOPHAGOGASTRODUODENOSCOPY N/A 10/27/2012   Procedure: ESOPHAGOGASTRODUODENOSCOPY (EGD);  Surgeon: PaBeryle BeamsMD;  [facility];  Service: Endoscopy;  Laterality: N/A;  . FEMORAL ARTERY STENT  09/20/2011   6 x 40 Smart Nitinol self-expanding stent placed;  10/15/2031 -R SFA stent open and patent w/o evidence of restenosis  . FRACTIONAL FLOW RESERVE WIRE  12/14/2013   Procedure: FRACTIONAL FLOW RESERVE WIRE;  Surgeon: DaLarey DresserMD;  [facility];  Service: Cardiovascular;;  . LACERATION REPAIR     right hand  . LEFT AND RIGHT HEART CATHETERIZATION WITH CORONARY ANGIOGRAM N/A 12/14/2013   Procedure: LEFT AND RIGHT HEART CATHETERIZATION WITH CORONARY ANGIOGRAM;  Surgeon: DaLarey DresserMD;  [facility];  Service: Cardiovascular;  Laterality: N/A;  . LOWER EXTREMITY ANGIOGRAM N/A 09/20/2011   Procedure: LOWER EXTREMITY ANGIOGRAM;  Surgeon: JoLorretta HarpMD;  [facility];  Service: Cardiovascular;  Laterality: N/A;  . LYMPH NODE BIOPSY  06/2011   "core needle on 5"  . needle biopsy  2009   "on ankles for nerve damage"  . NEUROPLASTY / TRANSPOSITION ULNAR NERVE AT ELBOW     right  . PERCUTANEOUS CORONARY STENT INTERVENTION (PCI-S)  12/14/2013   Procedure: PERCUTANEOUS CORONARY STENT INTERVENTION (PCI-S);  Surgeon: DaLarey DresserMD;  [facility];  Service: Cardiovascular;;  Prox LAD 3.00x12 Promus DES   . PERIPHERAL ARTERIAL STENT GRAFT  09/20/11   right SFA  . REFRACTIVE SURGERY  ` 2004   "for glaucoma"  . TONSILLECTOMY  1968  . TRIGGER FINGER RELEASE Left 12/04/2015   Procedure: LEFT RING FINGER TRIGGER RELEASE;  Surgeon: Leanora Cover, MD;  Location: [facility];  Service: Orthopedics;  Laterality: Left;  Marland Kitchen VAGINAL HYSTERECTOMY  1985   Fibroids    Family History  Problem Relation Age of Onset  . Colon cancer Mother   . COPD Mother   . Emphysema Mother   . Cancer Maternal Grandmother   . Cancer Maternal Grandfather   . Heart disease Brother   . Diabetes Neg Hx     Allergies  Allergen Reactions  . Atorvastatin     Muscle pain  . Simvastatin Other (See Comments)    Muscle pain  . Sulfa Antibiotics     unknown  . Iodinated Diagnostic Agents Rash     Red rash after cardiac cath 1 wk ago, ? Contrast allergy, requires 13 hr prep now per [email or web address]    Current Outpatient Prescriptions on File Prior to Visit  Medication Sig Dispense Refill  . Ascorbic Acid (VITAMIN C) 500 MG tablet Take 500 mg by mouth 2 (two) times daily.     Marland Kitchen aspirin EC 81 MG tablet Take 1 tablet (81 mg total) by mouth daily.    . benzonatate (TESSALON) 200 MG capsule Take 1 capsule (200 mg total) by mouth 3 (three) times  daily as needed for cough. 12 capsule 0  . calcium-vitamin D (CALCIUM 500+D) 500-200 MG-UNIT per tablet Take 1 tablet by mouth 2 (two) times daily.     . citalopram (CELEXA) 20 MG tablet TAKE 1 TABLET (20 MG TOTAL) BY MOUTH AT BEDTIME. 30 tablet 2  . ferrous sulfate 325 (65 FE) MG tablet Take 1 tablet (325 mg total) by mouth daily. 30 tablet 0  . folic acid (FOLVITE) 1 MG tablet Take 1 mg by mouth daily.    Marland Kitchen gabapentin (NEURONTIN) 300 MG capsule TAKE ONE CAPSULE BY MOUTH THREE TIMES A DAY 90 capsule 0  . latanoprost (XALATAN) 0.005 % ophthalmic solution PLACE 1 DROP INTO BOTH EYES AT BEDTIME. 2.5 mL 2  . metFORMIN (GLUCOPHAGE) 1000 MG tablet TAKE 1 TABLET (1,000 MG TOTAL) BY MOUTH 2 (TWO) TIMES DAILY WITH A MEAL. 60 tablet 10  . metoprolol succinate (TOPROL-XL) 50 MG 24 hr tablet Take 1 tablet (50 mg total) by mouth daily. TAKE 1 TABLET (50 MG TOTAL) BY MOUTH DAILY. 90 tablet 3  . nitroGLYCERIN (NITROSTAT) 0.4 MG SL tablet Place 1 tablet (0.4 mg total) under the tongue every 5 (five) minutes as needed for chest pain. (Patient not taking: Reported on 11/22/2016) 25 tablet 3  . nystatin cream (MYCOSTATIN) Apply 1 application topically 2 (two) times daily. 30 g 1  . pantoprazole (PROTONIX) 40 MG tablet Take 1 tablet (40 mg total) by mouth daily. 30 tablet 3  . Potassium Chloride ER 20 MEQ TBCR Take 60 mEq (3 Tablets) in the AM and 40 mEq (2 Tablets) in the PM 450 tablet 3  . rosuvastatin (CRESTOR) 20 MG tablet Take 1 tablet (20 mg total) by mouth daily. 90 tablet 3  . torsemide (DEMADEX) 20 MG tablet Take 60 mg (3 Tablets) in the AM and 40 mg (2 Tablets) in the PM. 450 tablet 3  . traZODone (DESYREL) 50 MG tablet TAKE 0.5-1 TABLETS (25-50 MG TOTAL)  BY MOUTH AT BEDTIME AS NEEDED FOR SLEEP. 30 tablet 0  . warfarin (COUMADIN) 3 MG tablet Tale 1/2 to 1 tablet daily as directed by coumadin clinic 30 tablet 1   No current facility-administered medications on file prior to visit.     BP 119/66   Pulse 72    Temp 99 F (37.2 C) (Oral)   Resp 16   Ht 5' 2"  (1.575 m)   Wt 188 lb 3.2 oz (85.4 kg)   SpO2 100%   BMI 34.42 kg/m       Objective:   Physical Exam  General  Mental Status - Alert. General Appearance - Well groomed. Not in acute distress.  Skin Rashes- No Rashes.  HEENT Head- Normal. Ear Auditory Canal - Left- deep wax covering entire tm. Right - mild blocked by wax.Tympanic Membrane- Left- can't see due to was Right- cant't see due to wax(post lavage wax cleared and normal tm's)  Eye Sclera/Conjunctiva- Left- Normal. Right- Normal. On inspection left eye lids not swollen, no dc from medial canthus.    Nose & Sinuses Nasal Mucosa- Left-  No  Boggy and Congested. Right- no   Boggy and  Congested.Bilateral  No maxillary and no  frontal sinus pressure. Faint soreness to palpation to both nares/sides Mouth & Throat Lips: Upper Lip- Normal: no dryness, cracking, pallor, cyanosis, or vesicular eruption. Lower Lip-Normal: no dryness, cracking, pallor, cyanosis or vesicular eruption. Buccal Mucosa- Bilateral- No Aphthous ulcers. Oropharynx- No Discharge or Erythema. Tonsils: Characteristics- Bilateral- No Erythema or Congestion. Size/Enlargement- Bilateral- No enlargement. Discharge- bilateral-None.  Neck Neck- Supple. No Masses.   Chest and Lung Exam Auscultation: Breath Sounds:-Clear even and unlabored.  Cardiovascular Auscultation:Rythm- Regular, rate and rhythm. Murmurs & Other Heart Sounds:Ausculatation of the heart reveal- No Murmurs.  Lymphatic Head & Neck General Head & Neck Lymphatics: Bilateral: Description- No Localized lymphadenopathy.       Assessment & Plan:  For cerumen impaction we did ear lavage and successful both sides.  For nares infection use keflex antibiotic and mupirocin. If nares pain persists then will need to give antibiotic that could effect inr.  Warm compresses  twice to medial nose/eye region/eft eye region  Follow up in 7-10  days or as needed  Correll Denbow, Percell Miller, [employer]

## 2017-01-05 NOTE — Patient Instructions (Addendum)
For cerumen impaction we did ear lavage and successful both sides.  For nares infection use keflex antibiotic and mupirocin. If nares pain persists then will need to give antibiotic that could effect inr.  Warm compresses  twice to medial nose/ left eye region.  Follow up in 7-10 days or as needed

## 2017-01-06 ENCOUNTER — Other Ambulatory Visit: Payer: Self-pay

## 2017-01-06 MED ORDER — TRAZODONE HCL 50 MG PO TABS: ORAL_TABLET | ORAL | 0 refills | Status: DC

## 2017-01-06 NOTE — Telephone Encounter (Signed)
Spoke with pharmacy

## 2017-01-09 ENCOUNTER — Telehealth: Payer: Self-pay | Admitting: Medical

## 2017-01-09 ENCOUNTER — Encounter: Payer: Self-pay | Admitting: Medical

## 2017-01-09 DIAGNOSIS — R7989 Other specified abnormal findings of blood chemistry: Secondary | ICD-10-CM

## 2017-01-09 DIAGNOSIS — M8580 Other specified disorders of bone density and structure, unspecified site: Secondary | ICD-10-CM

## 2017-01-09 DIAGNOSIS — E2839 Other primary ovarian failure: Secondary | ICD-10-CM

## 2017-01-09 NOTE — Telephone Encounter (Signed)
dexascan order placed. Notify pt.

## 2017-01-10 ENCOUNTER — Encounter: Payer: Self-pay | Admitting: Medical

## 2017-01-11 ENCOUNTER — Other Ambulatory Visit: Payer: Self-pay | Admitting: Medical

## 2017-01-13 NOTE — Telephone Encounter (Signed)
Notified pt orders are in

## 2017-01-27 ENCOUNTER — Other Ambulatory Visit: Payer: Self-pay | Admitting: Cardiovascular Disease

## 2017-02-07 ENCOUNTER — Other Ambulatory Visit: Payer: Self-pay | Admitting: Medical

## 2017-02-14 ENCOUNTER — Other Ambulatory Visit: Payer: Self-pay | Admitting: Internal Medicine

## 2017-02-14 DIAGNOSIS — E119 Type 2 diabetes mellitus without complications: Secondary | ICD-10-CM

## 2017-02-14 NOTE — Telephone Encounter (Signed)
Patient has history of diabetes. Former PCP wrote her for metformin. I have put in future A1c numerous times but it looks like she's never got that done. Her metabolic panel shows slight decreased GFR and increase creatinine. It really would be beneficial to know her A1c level and to repeat the metabolic panel.  If her A1c's are not very high then I would consider giving her a lower dose of metformin since metformin can affect kidney function. Particularly would lower dose of metformin if kidney function looked worse than on last check.  So I did not refill the request as her previous prescription of metformin was at thousand milligrams twice daily.

## 2017-02-14 NOTE — Telephone Encounter (Signed)
Routing to patients PCP. Please advise. thanks

## 2017-02-22 ENCOUNTER — Ambulatory Visit (INDEPENDENT_AMBULATORY_CARE_PROVIDER_SITE_OTHER): Payer: Medicare Other | Admitting: Pharmacist Clinician (PhC)/ Clinical Pharmacy Specialist

## 2017-02-22 DIAGNOSIS — Z7901 Long term (current) use of anticoagulants: Secondary | ICD-10-CM | POA: Diagnosis not present

## 2017-02-22 DIAGNOSIS — Z952 Presence of prosthetic heart valve: Secondary | ICD-10-CM | POA: Diagnosis not present

## 2017-02-22 LAB — POCT INR: INR: 4.6

## 2017-03-06 ENCOUNTER — Other Ambulatory Visit: Payer: Self-pay | Admitting: Medical

## 2017-03-06 ENCOUNTER — Other Ambulatory Visit: Payer: Self-pay | Admitting: Cardiovascular Disease

## 2017-03-08 ENCOUNTER — Other Ambulatory Visit: Payer: Self-pay | Admitting: Medical

## 2017-03-08 ENCOUNTER — Ambulatory Visit (INDEPENDENT_AMBULATORY_CARE_PROVIDER_SITE_OTHER): Payer: Medicare Other | Admitting: Pharmacist

## 2017-03-08 DIAGNOSIS — Z952 Presence of prosthetic heart valve: Secondary | ICD-10-CM | POA: Diagnosis not present

## 2017-03-08 DIAGNOSIS — Z7901 Long term (current) use of anticoagulants: Secondary | ICD-10-CM | POA: Diagnosis not present

## 2017-03-08 LAB — POCT INR: INR: 3.3

## 2017-03-09 NOTE — Telephone Encounter (Signed)
lvm advising patient to schedule appointment with PCP

## 2017-03-09 NOTE — Telephone Encounter (Signed)
Pt due for follow up (drepression) please and schedule appointment.

## 2017-03-10 DIAGNOSIS — Z79899 Other long term (current) drug therapy: Secondary | ICD-10-CM | POA: Diagnosis not present

## 2017-03-10 DIAGNOSIS — Z5181 Encounter for therapeutic drug level monitoring: Secondary | ICD-10-CM | POA: Diagnosis not present

## 2017-03-10 DIAGNOSIS — E1142 Type 2 diabetes mellitus with diabetic polyneuropathy: Secondary | ICD-10-CM | POA: Diagnosis not present

## 2017-03-10 DIAGNOSIS — Z23 Encounter for immunization: Secondary | ICD-10-CM | POA: Diagnosis not present

## 2017-03-15 ENCOUNTER — Telehealth: Payer: Self-pay | Admitting: Medical

## 2017-03-15 NOTE — Telephone Encounter (Signed)
Pt needs a1c, cmp and flu vaccine. Sugars had been mild high and kidney function mild decreased.  Her son sent me message with concern for her mental status/memory. He wanted me to get her in and then maybe touch base on memory and do possible mental status exam. So can you give her 30 minute appointment and try to give her 1 pm appointment.

## 2017-03-16 NOTE — Telephone Encounter (Signed)
Please call and schedule pt

## 2017-03-17 NOTE — Telephone Encounter (Signed)
Contacted patient, patient states she was seen at her diabetic doctor and already received flu shot, please advise

## 2017-03-17 NOTE — Telephone Encounter (Signed)
Notified pt PCP needs to see and and get labs. Pt scheduled for 03/21/17

## 2017-03-21 ENCOUNTER — Encounter: Payer: Self-pay | Admitting: Medical

## 2017-03-21 ENCOUNTER — Ambulatory Visit (INDEPENDENT_AMBULATORY_CARE_PROVIDER_SITE_OTHER): Payer: Medicare Other | Admitting: Medical

## 2017-03-21 VITALS — BP 133/53 | HR 99 | Temp 97.9°F | Resp 16 | Ht 62.0 in | Wt 191.0 lb

## 2017-03-21 DIAGNOSIS — E1129 Type 2 diabetes mellitus with other diabetic kidney complication: Secondary | ICD-10-CM

## 2017-03-21 DIAGNOSIS — R809 Proteinuria, unspecified: Secondary | ICD-10-CM | POA: Diagnosis not present

## 2017-03-21 DIAGNOSIS — I6523 Occlusion and stenosis of bilateral carotid arteries: Secondary | ICD-10-CM | POA: Diagnosis not present

## 2017-03-21 DIAGNOSIS — R5383 Other fatigue: Secondary | ICD-10-CM

## 2017-03-21 DIAGNOSIS — R6889 Other general symptoms and signs: Secondary | ICD-10-CM

## 2017-03-21 DIAGNOSIS — G629 Polyneuropathy, unspecified: Secondary | ICD-10-CM

## 2017-03-21 DIAGNOSIS — G4700 Insomnia, unspecified: Secondary | ICD-10-CM | POA: Diagnosis not present

## 2017-03-21 DIAGNOSIS — E785 Hyperlipidemia, unspecified: Secondary | ICD-10-CM | POA: Diagnosis not present

## 2017-03-21 LAB — COMPREHENSIVE METABOLIC PANEL
ALT: 13 U/L (ref 0–35)
AST: 22 U/L (ref 0–37)
Albumin: 4.3 g/dL (ref 3.5–5.2)
Alkaline Phosphatase: 55 U/L (ref 39–117)
BUN: 26 mg/dL — ABNORMAL HIGH (ref 6–23)
CO2: 31 mEq/L (ref 19–32)
Calcium: 9.6 mg/dL (ref 8.4–10.5)
Chloride: 102 mEq/L (ref 96–112)
Creatinine, Ser: 1.38 mg/dL — ABNORMAL HIGH (ref 0.40–1.20)
GFR: 40 mL/min — ABNORMAL LOW (ref >60.00)
Glucose, Bld: 138 mg/dL — ABNORMAL HIGH (ref 70–99)
Potassium: 4 mEq/L (ref 3.5–5.1)
Sodium: 142 mEq/L (ref 135–145)
Total Bilirubin: 0.8 mg/dL (ref 0.2–1.2)
Total Protein: 7.3 g/dL (ref 6.0–8.3)

## 2017-03-21 LAB — CBC WITH DIFFERENTIAL/PLATELET
Basophils Absolute: 0.1 10*3/uL (ref 0.0–0.1)
Basophils Relative: 0.9 % (ref 0.0–3.0)
Eosinophils Absolute: 0.1 10*3/uL (ref 0.0–0.7)
Eosinophils Relative: 1.5 % (ref 0.0–5.0)
HCT: 31.6 % — ABNORMAL LOW (ref 36.0–46.0)
Hemoglobin: 10.5 g/dL — ABNORMAL LOW (ref 12.0–15.0)
Lymphocytes Relative: 33.2 % (ref 12.0–46.0)
Lymphs Abs: 2.3 10*3/uL (ref 0.7–4.0)
MCHC: 33.2 g/dL (ref 30.0–36.0)
MCV: 89.6 fl (ref 78.0–100.0)
Monocytes Absolute: 0.7 10*3/uL (ref 0.1–1.0)
Monocytes Relative: 9.8 % (ref 3.0–12.0)
Neutro Abs: 3.9 10*3/uL (ref 1.4–7.7)
Neutrophils Relative %: 54.6 % (ref 43.0–77.0)
Platelets: 144 10*3/uL — ABNORMAL LOW (ref 150.0–400.0)
RBC: 3.53 Mil/uL — ABNORMAL LOW (ref 3.87–5.11)
RDW: 15.9 % — ABNORMAL HIGH (ref 11.5–15.5)
WBC: 7.1 10*3/uL (ref 4.0–10.5)

## 2017-03-21 LAB — LIPID PANEL
Cholesterol: 122 mg/dL (ref 0–200)
HDL: 42.1 mg/dL (ref >39.00)
LDL Cholesterol: 44 mg/dL (ref 0–99)
NonHDL: 79.83
Total CHOL/HDL Ratio: 3
Triglycerides: 180 mg/dL — ABNORMAL HIGH (ref 0.0–149.0)
VLDL: 36 mg/dL (ref 0.0–40.0)

## 2017-03-21 LAB — VITAMIN B12: Vitamin B-12: 196 pg/mL — ABNORMAL LOW (ref 211–911)

## 2017-03-21 MED ORDER — ALPRAZOLAM 0.5 MG PO TABS: 0.5000 mg | ORAL_TABLET | Freq: Every day | ORAL | 0 refills | Status: DC

## 2017-03-21 NOTE — Progress Notes (Signed)
Subjective:    Patient ID: Abigail Oliver, female    DOB: [birth year], 71 y.o.   MRN: [id number]  HPI  Pt in for follow up. Her a1-c was at 6.0.   Pt bp is well controlled.   She has chronic diastolic heart failure and sees cardiologist.  Pt feels like she is retaining fluid but no sob or chest pain.  Recently very stressed related to leaking roof. Trouble sleeping. Pt anxious recently.Pt states in past trazadone did not help. Ambien gave her night. Pt used some medication of her daughter. May have been xanax(I educated and explained extreme risk of this)  Pt cholesterol mild elevated.  Pt had flu vaccine at diabetic doctor office.  Pt son is worried about pt memory. She indicated stress at home and with husband.     Review of Systems  Constitutional: Negative for chills, fatigue and fever.  Respiratory: Negative for chest tightness, shortness of breath and wheezing.   Cardiovascular: Negative for chest pain and palpitations.  Gastrointestinal: Negative for abdominal pain.  Genitourinary: Negative for decreased urine volume, dyspareunia, enuresis, genital sores, menstrual problem and vaginal pain.  Skin: Negative for rash.  Neurological: Negative for dizziness, syncope, facial asymmetry, weakness and numbness.  Hematological: Negative for adenopathy. Does not bruise/bleed easily.  Psychiatric/Behavioral: Positive for dysphoric mood. Negative for behavioral problems and sleep disturbance. The patient is nervous/anxious.        Sleeping maybe 3-4 hours a night.   Past Medical History:  Diagnosis Date  . Anemia   . Anxiety   . Aortic stenosis    a. Now has St Jude mechanical aortic valve (6/00).b. Echo (6/15) with EF 60-65%, mechanical aortic valve with mean gradient 27 mmHg.  . Arthritis    a. Possible c-spine arthritis with pain down left arm.   . Carotid artery disease ([place])    a. Carotid dopplers (7/16) with 40-59% BICA stenosis.   . Chronic angle-closure  glaucoma(365.23)   . Chronic diastolic CHF (congestive heart failure) ([place])    a.  RHC (7/15) with mean RA 2, PA 22/11, mean PCWP 9, CI 3.79.  Marland Kitchen Colon polyps   . Contrast media allergy   . Coronary atherosclerosis of native coronary artery    a. CABG at time of AVR in 6/00 with RIMA-RCA.b. Abnl nuc 11/2013 -> LHC (7/15) with patent RIMA-RCA, 80% mRCA, 80% pLAD with FFR 0.73, treated with DES to pLAD.  Marland Kitchen Depression   . Dizziness 02/09/2012   a. Holter (8/14) with few PACs, otherwise unremarkable.   . Elevated transaminase level   . Essential hypertension   . Gastritis   . GERD (gastroesophageal reflux disease)   . Hyperlipidemia   . Low back pain   . Mechanical heart valve present   . Neuromuscular disorder ([facility])    neuropathy  . Peripheral vascular disease ([place])    a, H/o Right SFA stent. b. Peripheral arterial dopplers (7/16) with right SFA stent patent.   Marland Kitchen PONV (postoperative nausea and vomiting)    N&V  . Pulmonary nodules    a. Noted by CT in 6/15. CT chest (8/15) was thought to indicate that nodules were benign. No further workup was recommended.   . Syncope 03/29/2012   carotid doppler - R ICA 50-69% reduction by velocities (low end); L ICA 0-49% reduction by velocities (low end); R and L subclavian arteries - <50% redcution; R and L vertebral arteries show normal antegrade flow  . Type II diabetes mellitus ([place])  Social History   Social History  . Marital status: Married    Spouse name: [name]  . Number of children: 2  . Years of education: 14   Occupational History  .      hair dresser   Social History Main Topics  . Smoking status: Never Smoker  . Smokeless tobacco: Never Used  . Alcohol use No  . Drug use: No  . Sexual activity: No   Other Topics Concern  . Not on file   Social History Narrative   Pt is a high school graduate with 2 years of college. Married in 1967 she has 1 son born 75 and 1 daughter born 18 and 1 grandchild. 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      Caffeine use- 2-3 /day, soda, tea     Past Surgical History:  Procedure Laterality Date  . BILATERAL OOPHORECTOMY  1987  . BREAST BIOPSY  2012   left  . CARDIAC CATHETERIZATION  01/14/1999   normal LV function, severe aortic stenosis; 80% and 70% stenosis in RCA; mild 20% distal norrowing in L main with 20% proximal LAD stenosis, 40% diagonal stenosis and 20% proximal circumflex stenosis  . CARDIAC VALVE REPLACEMENT  2000   aortic valve   . CARDIOVASCULAR STRESS TEST  08/25/2011   R/L MV - EF 72%; no scintigraphic evidence of inducible MI; normal perfusionTID of 1.25 elevated - could indicate small vessle subendocardial ischemia; EKG NSR at 66, non diagnostic for ischemia  . [place]   bilaterally  . CATARACT EXTRACTION W/ INTRAOCULAR LENS  IMPLANT, BILATERAL  ~ 2007  . [place]; 1971  . CHOLECYSTECTOMY  1990  . COLONOSCOPY N/A 10/27/2012   Procedure: COLONOSCOPY;  Surgeon: Beryle Beams, MD;  Location: [facility];  Service: Endoscopy;  Laterality: N/A;  . CORONARY ARTERY BYPASS GRAFT  2000   RIMA to RCA  . DOPPLER ECHOCARDIOGRAPHY  03/29/2012   EF >55%; mild concentric LVH; stage 1 diastolic dysfunction, elevated LV filling pressure, dilated LA; MAC mild MR; St Jude AVR peak and mean gradients of 39mHg and 267mg; transvalvular gradients have increased (prev 23 and 14 respectively)  . ESOPHAGOGASTRODUODENOSCOPY N/A 10/27/2012   Procedure: ESOPHAGOGASTRODUODENOSCOPY (EGD);  Surgeon: PaBeryle BeamsMD;  [facility];  Service: Endoscopy;  Laterality: N/A;  . FEMORAL ARTERY STENT  09/20/2011   6 x 40 Smart Nitinol self-expanding stent placed;  10/15/2031 -R SFA stent open and patent w/o evidence of restenosis  . FRACTIONAL FLOW RESERVE WIRE  12/14/2013   Procedure: FRACTIONAL FLOW RESERVE WIRE;  Surgeon: DaLarey DresserMD;  [facility];  Service: Cardiovascular;;  . LACERATION REPAIR      right hand  . LEFT AND RIGHT HEART CATHETERIZATION WITH CORONARY ANGIOGRAM N/A 12/14/2013   Procedure: LEFT AND RIGHT HEART CATHETERIZATION WITH CORONARY ANGIOGRAM;  Surgeon: DaLarey DresserMD;  [facility];  Service: Cardiovascular;  Laterality: N/A;  . LOWER EXTREMITY ANGIOGRAM N/A 09/20/2011   Procedure: LOWER EXTREMITY ANGIOGRAM;  Surgeon: JoLorretta HarpMD;  [facility];  Service: Cardiovascular;  Laterality: N/A;  . LYMPH NODE BIOPSY  06/2011   "core needle on 5"  . needle biopsy  2009   "on ankles for nerve damage"  . NEUROPLASTY / TRANSPOSITION ULNAR NERVE AT ELBOW     right  . PERCUTANEOUS CORONARY STENT INTERVENTION (PCI-S)  12/14/2013   Procedure: PERCUTANEOUS  CORONARY STENT INTERVENTION (PCI-S);  Surgeon: Larey Dresser, MD;  Location[facility] C[facility];  Service: Cardiovascular;;  Prox LAD 3.00x12 Promus DES   . PERIPHERAL ARTERIAL STENT GRAFT  09/20/11   right SFA  . REFRACTIVE SURGERY  ` 2004   "for glaucoma"  . TONSILLECTOMY  1968  . TRIGGER FINGER RELEASE Left 12/04/2015   Procedure: LEFT RING FINGER TRIGGER RELEASE;  Surgeon: Leanora Cover, MD;  Location: [facility];  Service: Orthopedics;  Laterality: Left;  Marland Kitchen VAGINAL HYSTERECTOMY  1985   Fibroids    Family History  Problem Relation Age of Onset  . Colon cancer Mother   . COPD Mother   . Emphysema Mother   . Cancer Maternal Grandmother   . Cancer Maternal Grandfather   . Heart disease Brother   . Diabetes Neg Hx     Allergies  Allergen Reactions  . Atorvastatin     Muscle pain  . Simvastatin Other (See Comments)    Muscle pain  . Sulfa Antibiotics     unknown  . Iodinated Diagnostic Agents Rash     Red rash after cardiac cath 1 wk ago, ? Contrast allergy, requires 13 hr prep now per [email or web address]    Current Outpatient Prescriptions on File Prior to Visit  Medication Sig Dispense Refill  . Ascorbic Acid (VITAMIN C) 500 MG tablet Take 500 mg by mouth 2 (two)  times daily.     Marland Kitchen aspirin EC 81 MG tablet Take 1 tablet (81 mg total) by mouth daily.    . calcium-vitamin D (CALCIUM 500+D) 500-200 MG-UNIT per tablet Take 1 tablet by mouth 2 (two) times daily.     . citalopram (CELEXA) 20 MG tablet TAKE ONE TABLET BY MOUTH EVERY NIGHT AT BEDTIME 30 tablet 1  . ferrous sulfate 325 (65 FE) MG tablet Take 1 tablet (325 mg total) by mouth daily. 30 tablet 0  . folic acid (FOLVITE) 1 MG tablet Take 1 mg by mouth daily.    Marland Kitchen gabapentin (NEURONTIN) 300 MG capsule TAKE ONE CAPSULE BY MOUTH THREE TIMES A DAY 90 capsule 0  . latanoprost (XALATAN) 0.005 % ophthalmic solution PLACE 1 DROP INTO BOTH EYES AT BEDTIME. 2.5 mL 2  . metFORMIN (GLUCOPHAGE) 1000 MG tablet TAKE 1 TABLET (1,000 MG TOTAL) BY MOUTH 2 (TWO) TIMES DAILY WITH A MEAL. 60 tablet 10  . metoprolol succinate (TOPROL-XL) 50 MG 24 hr tablet Take 1 tablet (50 mg total) by mouth daily. TAKE 1 TABLET (50 MG TOTAL) BY MOUTH DAILY. 90 tablet 3  . nitroGLYCERIN (NITROSTAT) 0.4 MG SL tablet Place 1 tablet (0.4 mg total) under the tongue every 5 (five) minutes as needed for chest pain. 25 tablet 3  . nystatin cream (MYCOSTATIN) Apply 1 application topically 2 (two) times daily. 30 g 1  . pantoprazole (PROTONIX) 40 MG tablet Take 1 tablet (40 mg total) by mouth daily. 30 tablet 3  . Potassium Chloride ER 20 MEQ TBCR Take 60 mEq (3 Tablets) in the AM and 40 mEq (2 Tablets) in the PM 450 tablet 3  . rosuvastatin (CRESTOR) 20 MG tablet Take 1 tablet (20 mg total) by mouth daily. 90 tablet 3  . torsemide (DEMADEX) 20 MG tablet Take 60 mg (3 Tablets) in the AM and 40 mg (2 Tablets) in the PM. 450 tablet 3  . traZODone (DESYREL) 50 MG tablet TAKE 0.5-1 TABLETS (25-50 MG TOTAL) BY MOUTH AT BEDTIME AS NEEDED FOR SLEEP. 30 tablet 0  . warfarin (  COUMADIN) 3 MG tablet TAKE 1/2 TO 1 TABLET DAILY AS DIRECTED BY COUMADIN CLINIC 30 tablet 0   No current facility-administered medications on file prior to visit.     BP (!) 133/53    Pulse 99   Temp 97.9 F (36.6 C) (Oral)   Resp 16   Ht _0  (1.575 m)   Wt 191 lb (86.6 kg)   SpO2 99%   BMI 34.93 kg/m       Objective:   Physical Exam  General Mental Status- Alert. General Appearance- Not in acute distress. But teary at times when talks about stress levels.  Skin General: Color- Normal Color. Moisture- Normal Moisture.  Neck Carotid Arteries- Normal color. Moisture- Normal Moisture. No carotid bruits. No JVD.  Chest and Lung Exam Auscultation: Breath Sounds:-Normal.  Cardiovascular Auscultation:Rythm- Regular. Murmurs & Other Heart Sounds:Auscultation of the heart reveals- No Murmurs.  Abdomen Inspection:-Inspeection Normal. Palpation/Percussion:Note:No mass. Palpation and Percussion of the abdomen reveal- Non Tender, Non Distended + BS, no rebound or guarding.    Neurologic Cranial Nerve exam:- CN III-XII intact(No nystagmus), symmetric smile. Strength:- 5/5 equal and symmetric strength both upper and lower extremities.  Lower ext- no pedal edema. Negative homans signs.     Assessment & Plan:  For your fatigue will get CBC, CMP, TSH, B12 and B1.  Recent microalbumin in urine and I do want to check kidney function today.  For insomnia, stress and anxiety, I am prescribing Xanax.  Rx advisement  given.    For high cholesterol get lipid panel. For diabetic neuropathy continue Neurontin.  You passed a Mini-Mental status scoring 30 out of 30.  I think some of your daily memory issues may be related to recent severe high level  stress and some depression.  Follow-up date to be determined after lab review.

## 2017-03-21 NOTE — Patient Instructions (Addendum)
For your fatigue will get CBC, CMP, TSH, B12 and B1.  Recent microalbumin in urine and I do want to check kidney function today.  For insomnia, stress and anxiety, I am prescribing Xanax.  Rx advisement  given.    For high cholesterol get lipid panel. For diabetic neuropathy continue Neurontin.  You passed a Mini-Mental status scoring 30 out of 30.  I think some of your daily memory issues may be related to recent severe high level stress and some depression.  Follow-up date to be determined after lab review.

## 2017-03-23 NOTE — Telephone Encounter (Signed)
error:[id number] ° °

## 2017-03-25 ENCOUNTER — Telehealth: Payer: Self-pay | Admitting: Medical

## 2017-03-25 ENCOUNTER — Encounter: Payer: Self-pay | Admitting: Medical

## 2017-03-25 DIAGNOSIS — D649 Anemia, unspecified: Secondary | ICD-10-CM

## 2017-03-25 NOTE — Telephone Encounter (Signed)
Pt.notified Pt states she will call back and make appointment.

## 2017-03-25 NOTE — Telephone Encounter (Signed)
I placed future iron studies and ifob. Also would you arrange her to have b12 monthly injections for next 5 months. First injection can be when she comes in for iron studies.

## 2017-03-27 ENCOUNTER — Telehealth: Payer: Self-pay | Admitting: Medical

## 2017-03-27 DIAGNOSIS — R5383 Other fatigue: Secondary | ICD-10-CM

## 2017-03-27 LAB — VITAMIN B1: Vitamin B1 (Thiamine): <6 nmol/L — ABNORMAL LOW (ref 8–30)

## 2017-03-27 NOTE — Telephone Encounter (Signed)
Future order placed b1.

## 2017-03-31 ENCOUNTER — Ambulatory Visit (INDEPENDENT_AMBULATORY_CARE_PROVIDER_SITE_OTHER): Payer: Medicare Other

## 2017-03-31 ENCOUNTER — Telehealth: Payer: Self-pay

## 2017-03-31 DIAGNOSIS — E538 Deficiency of other specified B group vitamins: Secondary | ICD-10-CM | POA: Diagnosis not present

## 2017-03-31 MED ORDER — ALPRAZOLAM 0.5 MG PO TABS: 0.5000 mg | ORAL_TABLET | Freq: Every day | ORAL | 0 refills | Status: DC

## 2017-03-31 MED ORDER — CYANOCOBALAMIN 1000 MCG/ML IJ SOLN
1000.0000 ug | Freq: Once | INTRAMUSCULAR | Status: AC
  Administered 2017-03-31: 1000 ug via INTRAMUSCULAR

## 2017-03-31 MED ORDER — TRAZODONE HCL 50 MG PO TABS: ORAL_TABLET | ORAL | 0 refills | Status: DC

## 2017-03-31 NOTE — Telephone Encounter (Signed)
OK to refill both meds as requested

## 2017-03-31 NOTE — Progress Notes (Signed)
Pre visit review using our clinic tool,if applicable. No additional management support is needed unless otherwise documented below in the visit note.   Patient in for B12 injection per order from E. Saguier, PA-C. Patient currently has B12 deficiency.  This will be 1st of 5 injections per order.   Given 1000 mcg IM left deltoid. Patient tolerated well.  Appointment scheduled for April 28, 2017

## 2017-04-03 ENCOUNTER — Other Ambulatory Visit: Payer: Self-pay | Admitting: Medical

## 2017-04-03 ENCOUNTER — Other Ambulatory Visit: Payer: Self-pay | Admitting: Cardiovascular Disease

## 2017-04-06 ENCOUNTER — Other Ambulatory Visit: Payer: Self-pay | Admitting: Internal Medicine

## 2017-04-22 MED ORDER — TRAZODONE HCL 50 MG PO TABS: ORAL_TABLET | ORAL | 1 refills | Status: DC

## 2017-04-22 NOTE — Telephone Encounter (Signed)
Rx resent.

## 2017-04-22 NOTE — Addendum Note (Signed)
Addended by: Damita Dunnings D on: 04/22/2017 09:32 AM   Modules accepted: Orders

## 2017-04-22 NOTE — Telephone Encounter (Signed)
Abigail Oliver pharmacy called in because she said that they never received Rx for Trazodone HCI. She would like to know if Rx could be resent?    CB: [phone]

## 2017-04-23 ENCOUNTER — Other Ambulatory Visit: Payer: Self-pay | Admitting: Internal Medicine

## 2017-04-23 ENCOUNTER — Other Ambulatory Visit: Payer: Self-pay | Admitting: Cardiovascular Disease

## 2017-04-23 ENCOUNTER — Other Ambulatory Visit: Payer: Self-pay | Admitting: Medical

## 2017-04-24 ENCOUNTER — Other Ambulatory Visit: Payer: Self-pay | Admitting: Medical

## 2017-04-28 ENCOUNTER — Ambulatory Visit: Payer: Self-pay

## 2017-04-28 ENCOUNTER — Other Ambulatory Visit: Payer: Self-pay | Admitting: Internal Medicine

## 2017-05-22 ENCOUNTER — Other Ambulatory Visit: Payer: Self-pay | Admitting: Internal Medicine

## 2017-05-27 DIAGNOSIS — E118 Type 2 diabetes mellitus with unspecified complications: Secondary | ICD-10-CM | POA: Diagnosis not present

## 2017-05-27 DIAGNOSIS — R51 Headache: Secondary | ICD-10-CM | POA: Diagnosis not present

## 2017-05-27 DIAGNOSIS — K122 Cellulitis and abscess of mouth: Secondary | ICD-10-CM | POA: Diagnosis not present

## 2017-05-29 ENCOUNTER — Other Ambulatory Visit: Payer: Self-pay | Admitting: Internal Medicine

## 2017-05-29 ENCOUNTER — Other Ambulatory Visit: Payer: Self-pay | Admitting: Cardiovascular Disease

## 2017-05-29 ENCOUNTER — Other Ambulatory Visit: Payer: Self-pay | Admitting: Medical

## 2017-06-02 ENCOUNTER — Other Ambulatory Visit: Payer: Self-pay | Admitting: Internal Medicine

## 2017-06-03 ENCOUNTER — Other Ambulatory Visit: Payer: Self-pay | Admitting: Internal Medicine

## 2017-06-10 ENCOUNTER — Other Ambulatory Visit: Payer: Self-pay | Admitting: Cardiovascular Disease

## 2017-06-13 ENCOUNTER — Ambulatory Visit (INDEPENDENT_AMBULATORY_CARE_PROVIDER_SITE_OTHER): Payer: Medicare Other | Admitting: Pharmacist Clinician (PhC)/ Clinical Pharmacy Specialist

## 2017-06-13 DIAGNOSIS — Z7901 Long term (current) use of anticoagulants: Secondary | ICD-10-CM

## 2017-06-13 DIAGNOSIS — Z952 Presence of prosthetic heart valve: Secondary | ICD-10-CM | POA: Diagnosis not present

## 2017-06-13 LAB — POCT INR: INR: 1.1

## 2017-06-13 NOTE — Patient Instructions (Signed)
Description   Take 2 tablets toda, January 21st and 1.5 tablets tomorrow, January 22nd. Take 0.5 tablet each Sunday and 1 tablet all other days. Repeat INR in 2 weeks.  Coumadin clinic [phone]

## 2017-06-19 ENCOUNTER — Other Ambulatory Visit: Payer: Self-pay | Admitting: Cardiovascular Disease

## 2017-06-24 ENCOUNTER — Other Ambulatory Visit: Payer: Self-pay | Admitting: Medical

## 2017-06-24 ENCOUNTER — Other Ambulatory Visit: Payer: Self-pay | Admitting: Family Medicine

## 2017-06-27 ENCOUNTER — Other Ambulatory Visit: Payer: Self-pay | Admitting: Family Medicine

## 2017-07-19 ENCOUNTER — Other Ambulatory Visit (HOSPITAL_COMMUNITY): Payer: Self-pay | Admitting: Cardiology

## 2017-07-19 ENCOUNTER — Other Ambulatory Visit: Payer: Self-pay | Admitting: Family Medicine

## 2017-07-19 ENCOUNTER — Other Ambulatory Visit: Payer: Self-pay | Admitting: Medical

## 2017-08-03 ENCOUNTER — Other Ambulatory Visit: Payer: Self-pay | Admitting: Internal Medicine

## 2017-08-18 ENCOUNTER — Other Ambulatory Visit: Payer: Self-pay | Admitting: Medical

## 2017-08-18 ENCOUNTER — Other Ambulatory Visit: Payer: Self-pay | Admitting: Cardiovascular Disease

## 2017-08-23 ENCOUNTER — Other Ambulatory Visit: Payer: Self-pay | Admitting: Internal Medicine

## 2017-09-01 ENCOUNTER — Other Ambulatory Visit: Payer: Self-pay | Admitting: Cardiovascular Disease

## 2017-09-01 ENCOUNTER — Other Ambulatory Visit: Payer: Self-pay | Admitting: Medical

## 2017-09-10 ENCOUNTER — Other Ambulatory Visit: Payer: Self-pay | Admitting: Cardiology

## 2017-09-12 DIAGNOSIS — Z23 Encounter for immunization: Secondary | ICD-10-CM | POA: Diagnosis not present

## 2017-09-12 DIAGNOSIS — Z79899 Other long term (current) drug therapy: Secondary | ICD-10-CM | POA: Diagnosis not present

## 2017-09-12 DIAGNOSIS — E1142 Type 2 diabetes mellitus with diabetic polyneuropathy: Secondary | ICD-10-CM | POA: Diagnosis not present

## 2017-09-12 DIAGNOSIS — Z7984 Long term (current) use of oral hypoglycemic drugs: Secondary | ICD-10-CM | POA: Diagnosis not present

## 2017-09-17 ENCOUNTER — Other Ambulatory Visit: Payer: Self-pay | Admitting: Cardiovascular Disease

## 2017-09-17 ENCOUNTER — Other Ambulatory Visit: Payer: Self-pay | Admitting: Medical

## 2017-09-19 ENCOUNTER — Ambulatory Visit (INDEPENDENT_AMBULATORY_CARE_PROVIDER_SITE_OTHER): Payer: Medicare Other | Admitting: Pharmacist Clinician (PhC)/ Clinical Pharmacy Specialist

## 2017-09-19 DIAGNOSIS — Z7901 Long term (current) use of anticoagulants: Secondary | ICD-10-CM | POA: Diagnosis not present

## 2017-09-19 DIAGNOSIS — Z952 Presence of prosthetic heart valve: Secondary | ICD-10-CM | POA: Diagnosis not present

## 2017-09-19 LAB — POCT INR: INR: 3.2

## 2017-09-19 NOTE — Patient Instructions (Signed)
Description   Continue with 0.5 tablet each Sunday and 1 tablet all other days. Repeat INR in 2 weeks.  Coumadin clinic [phone]

## 2017-10-07 DIAGNOSIS — N289 Disorder of kidney and ureter, unspecified: Secondary | ICD-10-CM | POA: Diagnosis not present

## 2017-10-14 ENCOUNTER — Other Ambulatory Visit (HOSPITAL_COMMUNITY): Payer: Self-pay | Admitting: Cardiology

## 2017-12-04 ENCOUNTER — Other Ambulatory Visit: Payer: Self-pay | Admitting: Medical

## 2017-12-07 ENCOUNTER — Telehealth: Payer: Self-pay | Admitting: Medical

## 2017-12-07 ENCOUNTER — Ambulatory Visit (HOSPITAL_BASED_OUTPATIENT_CLINIC_OR_DEPARTMENT_OTHER)
Admission: RE | Admit: 2017-12-07 | Discharge: 2017-12-07 | Disposition: A | Discharge status: Home / Self Care | Payer: Medicare Other | Source: Ambulatory Visit | Attending: Medical | Admitting: Medical

## 2017-12-07 ENCOUNTER — Ambulatory Visit (INDEPENDENT_AMBULATORY_CARE_PROVIDER_SITE_OTHER): Payer: Medicare Other | Admitting: Medical

## 2017-12-07 ENCOUNTER — Encounter: Payer: Self-pay | Admitting: Medical

## 2017-12-07 VITALS — BP 128/60 | HR 78 | Ht 62.0 in | Wt 195.0 lb

## 2017-12-07 DIAGNOSIS — R002 Palpitations: Secondary | ICD-10-CM

## 2017-12-07 DIAGNOSIS — Z7722 Contact with and (suspected) exposure to environmental tobacco smoke (acute) (chronic): Secondary | ICD-10-CM

## 2017-12-07 DIAGNOSIS — M25511 Pain in right shoulder: Secondary | ICD-10-CM

## 2017-12-07 DIAGNOSIS — G4700 Insomnia, unspecified: Secondary | ICD-10-CM

## 2017-12-07 DIAGNOSIS — R059 Cough, unspecified: Secondary | ICD-10-CM

## 2017-12-07 DIAGNOSIS — M898X1 Other specified disorders of bone, shoulder: Secondary | ICD-10-CM

## 2017-12-07 DIAGNOSIS — F419 Anxiety disorder, unspecified: Secondary | ICD-10-CM | POA: Diagnosis not present

## 2017-12-07 DIAGNOSIS — R05 Cough: Secondary | ICD-10-CM | POA: Diagnosis not present

## 2017-12-07 DIAGNOSIS — M19011 Primary osteoarthritis, right shoulder: Secondary | ICD-10-CM | POA: Diagnosis not present

## 2017-12-07 MED ORDER — PANTOPRAZOLE SODIUM 40 MG PO TBEC: 40.0000 mg | DELAYED_RELEASE_TABLET | Freq: Every day | ORAL | 11 refills | Status: DC

## 2017-12-07 MED ORDER — BENZONATATE 100 MG PO CAPS: 100.0000 mg | ORAL_CAPSULE | Freq: Three times a day (TID) | ORAL | 0 refills | Status: DC | PRN

## 2017-12-07 MED ORDER — ALPRAZOLAM 0.5 MG PO TABS: 0.5000 mg | ORAL_TABLET | Freq: Every day | ORAL | 0 refills | Status: DC

## 2017-12-07 NOTE — Telephone Encounter (Signed)
Referral to sports med placed.

## 2017-12-07 NOTE — Progress Notes (Signed)
Subjective:    Patient ID: Abigail Oliver, female    DOB: 05-12-1946, 72 y.o.   MRN: [id number]  HPI   Pt in with some prominence of rt medial aspect of clavicle prominence. She states just noticed this randomly on Thursday after getting out of shower. No fall or trauma. No hx of this before.  Pt also reports some coughing intermittently recently. When laying on left side. Also when breathing deep and coughing hurts this area.  Pt does not smoke. Some second intermittent second hand smoke through out her life through various family members.   Pt has history of reflux. Pt states she needs refill of protonix in past.   Pt has insomnia and anxiety for years. She used to use xanax in the past. But has not been on xanax. Looks like she had rx in November.     Review of Systems  Constitutional: Negative for chills, fatigue and fever.  HENT: Negative for congestion, ear discharge, facial swelling and mouth sores.   Respiratory: Negative for cough, chest tightness, shortness of breath and wheezing.   Cardiovascular: Negative for chest pain and palpitations.       Transient brief increased heart rate when lying down recently. When lays supine.  Gastrointestinal: Negative for abdominal distention, abdominal pain and anal bleeding.  Genitourinary: Negative for dyspareunia, flank pain, genital sores, menstrual problem and urgency.  Musculoskeletal: Negative for back pain, joint swelling and neck pain.  Skin: Negative for rash.  Neurological: Negative for dizziness, speech difficulty, weakness and light-headedness.  Hematological: Negative for adenopathy. Does not bruise/bleed easily.  Psychiatric/Behavioral: Negative for behavioral problems and confusion.    Past Medical History:  Diagnosis Date  . Anemia   . Anxiety   . Aortic stenosis    a. Now has St Jude mechanical aortic valve (6/00).b. Echo (6/15) with EF 60-65%, mechanical aortic valve with mean gradient 27 mmHg.  .  Arthritis    a. Possible c-spine arthritis with pain down left arm.   . Carotid artery disease ([place])    a. Carotid dopplers (7/16) with 40-59% BICA stenosis.   . Chronic angle-closure glaucoma(365.23)   . Chronic diastolic CHF (congestive heart failure) ([place])    a.  RHC (7/15) with mean RA 2, PA 22/11, mean PCWP 9, CI 3.79.  Marland Kitchen Colon polyps   . Contrast media allergy   . Coronary atherosclerosis of native coronary artery    a. CABG at time of AVR in 6/00 with RIMA-RCA.b. Abnl nuc 11/2013 -> LHC (7/15) with patent RIMA-RCA, 80% mRCA, 80% pLAD with FFR 0.73, treated with DES to pLAD.  Marland Kitchen Depression   . Dizziness 02/09/2012   a. Holter (8/14) with few PACs, otherwise unremarkable.   . Elevated transaminase level   . Essential hypertension   . Gastritis   . GERD (gastroesophageal reflux disease)   . Hyperlipidemia   . Low back pain   . Mechanical heart valve present   . Neuromuscular disorder (HCC)    neuropathy  . Peripheral vascular disease ([place])    a, H/o Right SFA stent. b. Peripheral arterial dopplers (7/16) with right SFA stent patent.   Marland Kitchen PONV (postoperative nausea and vomiting)    N&V  . Pulmonary nodules    a. Noted by CT in 6/15. CT chest (8/15) was thought to indicate that nodules were benign. No further workup was recommended.   . Syncope 03/29/2012   carotid doppler - R ICA 50-69% reduction by velocities (low end); L ICA  0-49% reduction by velocities (low end); R and L subclavian arteries - <50% redcution; R and L vertebral arteries show normal antegrade flow  . Type II diabetes mellitus ([place])      Social History   Socioeconomic History  . Marital status: Married    Spouse name: [name]  . Number of children: 2  . Years of education: 63  . Highest education level: Not on file  Occupational History    Comment: hair dresser  Social Needs  . Financial resource strain: Not on file  . Food insecurity:    Worry: Not on file    Inability: Not on file  .  Transportation needs:    Medical: Not on file    Non-medical: Not on file  Tobacco Use  . Smoking status: Never Smoker  . Smokeless tobacco: Never Used  Substance and Sexual Activity  . Alcohol use: No  . Drug use: No  . Sexual activity: Never  Lifestyle  . Physical activity:    Days per week: Not on file    Minutes per session: Not on file  . Stress: Not on file  Relationships  . Social connections:    Talks on phone: Not on file    Gets together: Not on file    Attends religious service: Not on file    Active member of club or organization: Not on file    Attends meetings of clubs or organizations: Not on file    Relationship status: Not on file  . Intimate partner violence:    Fear of current or ex partner: Not on file    Emotionally abused: Not on file    Physically abused: Not on file    Forced sexual activity: Not on file  Other Topics Concern  . Not on file  Social History Narrative   Pt is a high school graduate with 2 years of college. Married in 1967 she has 1 son born 30 and 1 daughter born 63 and 1 grandchild. 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      Caffeine use- 2-3 /day, soda, tea     Past Surgical History:  Procedure Laterality Date  . BILATERAL OOPHORECTOMY  1987  . BREAST BIOPSY  2012   left  . CARDIAC CATHETERIZATION  01/14/1999   normal LV function, severe aortic stenosis; 80% and 70% stenosis in RCA; mild 20% distal norrowing in L main with 20% proximal LAD stenosis, 40% diagonal stenosis and 20% proximal circumflex stenosis  . CARDIAC VALVE REPLACEMENT  2000   aortic valve   . CARDIOVASCULAR STRESS TEST  08/25/2011   R/L MV - EF 72%; no scintigraphic evidence of inducible MI; normal perfusionTID of 1.25 elevated - could indicate small vessle subendocardial ischemia; EKG NSR at 66, non diagnostic for ischemia  . [place]   bilaterally  . CATARACT EXTRACTION W/ INTRAOCULAR LENS  IMPLANT, BILATERAL  ~  2007  . [place]; 1971  . CHOLECYSTECTOMY  1990  . COLONOSCOPY N/A 10/27/2012   Procedure: COLONOSCOPY;  Surgeon: Beryle Beams, MD;  Location: [facility];  Service: Endoscopy;  Laterality: N/A;  . CORONARY ARTERY BYPASS GRAFT  2000   RIMA to RCA  . DOPPLER ECHOCARDIOGRAPHY  03/29/2012   EF >55%; mild concentric LVH; stage 1 diastolic dysfunction, elevated LV filling pressure, dilated LA; MAC mild MR; St Jude AVR peak and mean gradients of 11mHg and 241mg; transvalvular gradients have increased (  prev 23 and 14 respectively)  . ESOPHAGOGASTRODUODENOSCOPY N/A 10/27/2012   Procedure: ESOPHAGOGASTRODUODENOSCOPY (EGD);  Surgeon: Beryle Beams, MD;  Location: [facility];  Service: Endoscopy;  Laterality: N/A;  . FEMORAL ARTERY STENT  09/20/2011   6 x 40 Smart Nitinol self-expanding stent placed;  10/15/2031 -R SFA stent open and patent w/o evidence of restenosis  . FRACTIONAL FLOW RESERVE WIRE  12/14/2013   Procedure: FRACTIONAL FLOW RESERVE WIRE;  Surgeon: Larey Dresser, MD;  Location: [facility] CATH LAB;  Service: Cardiovascular;;  . LACERATION REPAIR     right hand  . LEFT AND RIGHT HEART CATHETERIZATION WITH CORONARY ANGIOGRAM N/A 12/14/2013   Procedure: LEFT AND RIGHT HEART CATHETERIZATION WITH CORONARY ANGIOGRAM;  Surgeon: Larey Dresser, MD;  Location[facility] C[facility] LAB;  Service: Cardiovascular;  Laterality: N/A;  . LOWER EXTREMITY ANGIOGRAM N/A 09/20/2011   Procedure: LOWER EXTREMITY ANGIOGRAM;  Surgeon: Lorretta Harp, MD;  Location: [facility] CATH LAB;  Service: Cardiovascular;  Laterality: N/A;  . LYMPH NODE BIOPSY  06/2011   "core needle on 5"  . needle biopsy  2009   "on ankles for nerve damage"  . NEUROPLASTY / TRANSPOSITION ULNAR NERVE AT ELBOW     right  . PERCUTANEOUS CORONARY STENT INTERVENTION (PCI-S)  12/14/2013   Procedure: PERCUTANEOUS CORONARY STENT INTERVENTION (PCI-S);  Surgeon: Larey Dresser, MD;  Location: [facility] CATH LAB;  Service: Cardiovascular;;  Prox LAD 3.00x12 Promus  DES   . PERIPHERAL ARTERIAL STENT GRAFT  09/20/11   right SFA  . REFRACTIVE SURGERY  ` 2004   "for glaucoma"  . TONSILLECTOMY  1968  . TRIGGER FINGER RELEASE Left 12/04/2015   Procedure: LEFT RING FINGER TRIGGER RELEASE;  Surgeon: Leanora Cover, MD;  Location: [facility];  Service: Orthopedics;  Laterality: Left;  Marland Kitchen VAGINAL HYSTERECTOMY  1985   Fibroids    Family History  Problem Relation Age of Onset  . Colon cancer Mother   . COPD Mother   . Emphysema Mother   . Cancer Maternal Grandmother   . Cancer Maternal Grandfather   . Heart disease Brother   . Diabetes Neg Hx     Allergies  Allergen Reactions  . Atorvastatin     Muscle pain  . Simvastatin Other (See Comments)    Muscle pain  . Sulfa Antibiotics     unknown  . Iodinated Diagnostic Agents Rash     Red rash after cardiac cath 1 wk ago, ? Contrast allergy, requires 13 hr prep now per [email or web address]    Current Outpatient Medications on File Prior to Visit  Medication Sig Dispense Refill  . ALPRAZolam (XANAX) 0.5 MG tablet Take 1 tablet (0.5 mg total) at bedtime by mouth. 10 tablet 0  . Ascorbic Acid (VITAMIN C) 500 MG tablet Take 500 mg by mouth 2 (two) times daily.     Marland Kitchen aspirin EC 81 MG tablet Take 1 tablet (81 mg total) by mouth daily.    . calcium-vitamin D (CALCIUM 500+D) 500-200 MG-UNIT per tablet Take 1 tablet by mouth 2 (two) times daily.     . citalopram (CELEXA) 20 MG tablet TAKE ONE TABLET BY MOUTH EVERY NIGHT AT BEDTIME 30 tablet 0  . ferrous sulfate 325 (65 FE) MG tablet Take 1 tablet (325 mg total) by mouth daily. 30 tablet 0  . folic acid (FOLVITE) 1 MG tablet Take 1 mg by mouth daily.    Marland Kitchen gabapentin (NEURONTIN) 300 MG capsule TAKE ONE CAPSULE BY MOUTH THREE TIMES A  DAY 90 capsule 0  . latanoprost (XALATAN) 0.005 % ophthalmic solution PLACE 1 DROP INTO BOTH EYES AT BEDTIME. 2.5 mL 2  . metFORMIN (GLUCOPHAGE) 1000 MG tablet TAKE 1 TABLET (1,000 MG TOTAL) BY MOUTH 2 (TWO) TIMES  DAILY WITH A MEAL. 60 tablet 10  . metoprolol succinate (TOPROL-XL) 50 MG 24 hr tablet TAKE ONE TABLET BY MOUTH DAILY 64 tablet 2  . nitroGLYCERIN (NITROSTAT) 0.4 MG SL tablet Place 1 tablet (0.4 mg total) under the tongue every 5 (five) minutes as needed for chest pain. 25 tablet 3  . nystatin cream (MYCOSTATIN) Apply 1 application topically 2 (two) times daily. 30 g 1  . Potassium Chloride ER 20 MEQ TBCR TAKE THREE TABLETS BY MOUTH EVERY MORNING AND TAKE TWO TABLETS BY MOUTH EVERY EVENING 450 tablet 2  . rosuvastatin (CRESTOR) 20 MG tablet Take 1 tablet (20 mg total) by mouth daily. 90 tablet 3  . torsemide (DEMADEX) 20 MG tablet TAKE THREE TABLETS BY MOUTH EVERY MORNING TAKE TWO TABLETS BY MOUTH EVERY EVENING 320 tablet 2  . traZODone (DESYREL) 50 MG tablet TAKE 1/2 TO 1 TABLET BY MOUTH EVERY NIGHT AT BEDTIME AS NEEDED  FOR SLEEP 21 tablet 0  . warfarin (COUMADIN) 3 MG tablet TAKE 1/2 TO 1 TABLET BY MOUTH DAILY AS DIRECTED 30 tablet 0  . warfarin (COUMADIN) 3 MG tablet TAKE 1/2 TO 1 TABLET BY MOUTH DAILY AS DIRECTED. 30 tablet 1   No current facility-administered medications on file prior to visit.     BP 128/60   Pulse 78   Wt 195 lb (88.5 kg)   SpO2 98%   BMI 35.67 kg/m       Objective:   Physical Exam  General Mental Status- Alert. General Appearance- Not in acute distress.   Skin General: Color- Normal Color. Moisture- Normal Moisture.  Neck Carotid Arteries- Normal color. Moisture- Normal Moisture. No carotid bruits. No JVD.  Chest and Lung Exam Auscultation: Breath Sounds:-Normal.  Cardiovascular Auscultation:Rythm- Regular. Murmurs & Other Heart Sounds:Auscultation of the heart reveals- No Murmurs.  Abdomen Inspection:-Inspeection Normal. Palpation/Percussion:Note:No mass. Palpation and Percussion of the abdomen reveal- Non Tender, Non Distended + BS, no rebound or guarding.    Neurologic Cranial Nerve exam:- CN III-XII intact(No nystagmus), symmetric  smile. Strength:- 5/5 equal and symmetric strength both upper and lower extremities.  Rt clavicle- at junction of clavicle/sternum junction appears slightly protruded and swollen at joint junction. Faint tender. No redness. No appearance of soft tissue swelling. No fluctuance.    Assessment & Plan:  For your recent bump type region and mild pain in medial clavicle area, I do want you to get right clavicle x-ray.  I will see what radiologist finds on x-ray and see if other imaging studies are recommended.  For pain during the interim can use Tylenol.    For recent intermittent cough, will get chest x-ray and update you on those findings.  We will make him benzonatate cough tablets available to use as needed.  For history of anxiety and insomnia, I am giving you prescription of Xanax 0.5 mg tablets to use at night if needed.  We will see how you do with this and if you need refills on regular basis then you need to sign controlled medication contract and give UDS.  We did EKG today due to your occasional feeling that your heart is beating faster when you lie supine.  Today during exam when you lay supine your pulse remained steady at around  78.  But did EKG to verify/check rhythm as well. Ekg today showed normal sinus rhythm.ST flattening I and avl very similar compared to 2017 ekg   Follow-up in 2 to 4 weeks or as needed.  40 minutes spent with pt. 50% of time counseling on explaining potential cause of her right clavicle region pain.  The work-up that we will do for cough and discussing the EKG findings as well as plan regarding was reviewed in the event of any more classic caridac symptoms were to occur.  Explained prior EKG compared to last EKG.  In addition discussed management of insmonia and anxiety forward in terms of contract and UDS.  [name], PA-C

## 2017-12-07 NOTE — Patient Instructions (Addendum)
For your recent bump type region and mild pain in medial clavicle area, I do want you to get right clavicle x-ray.  I will see what radiologist finds on x-ray and see if other imaging studies are recommended.  For pain during the interim can use Tylenol.    For recent intermittent cough, will get chest x-ray and update you on those findings.  We will make him benzonatate cough tablets available to use as needed.  For history of anxiety and insomnia, I am giving you prescription of Xanax 0.5 mg tablets to use at night if needed.  We will see how you do with this and if you need refills on regular basis then you need to sign controlled medication contract and give UDS.  We did EKG today due to your occasional feeling that your heart is beating faster when you lie supine.  Today during exam when you lay supine your pulse remained steady at around 78.  But did EKG to verify/check rhythm as well.  Ekg shows normal sinus rhythm. ST flattening I and avl very similar compared to 2017 ekg  Follow-up in 2 to 4 weeks or as needed.  If any cardiac type symptoms occur/severe then ED evaluation.

## 2017-12-12 ENCOUNTER — Encounter: Payer: Self-pay | Admitting: Family Medicine

## 2017-12-12 ENCOUNTER — Ambulatory Visit (INDEPENDENT_AMBULATORY_CARE_PROVIDER_SITE_OTHER): Payer: Medicare Other | Admitting: Family Medicine

## 2017-12-12 DIAGNOSIS — R0789 Other chest pain: Secondary | ICD-10-CM

## 2017-12-12 DIAGNOSIS — R079 Chest pain, unspecified: Secondary | ICD-10-CM

## 2017-12-12 NOTE — Patient Instructions (Signed)
We will go ahead with an MRI of your sternoclavicular joint to make sure this is benign or rotational. I will contact you with results of this. Otherwise no restrictions on activities.

## 2017-12-13 ENCOUNTER — Encounter: Payer: Self-pay | Admitting: Family Medicine

## 2017-12-13 NOTE — Progress Notes (Signed)
PCP and consultation requested by: [name], PA-C  Subjective:   HPI: Patient is a 72 y.o. female here for right clavicle swelling.  Patient reports about 1+ weeks ago she noticed a knot/swelling about the right [place] joint. No redness, injury. She has some tenderness to the touch but no pain otherwise. No limitation of motion. No fevers, chills, sweats. Difficult to lie on her side. She denies history of cancer, never smoked. Gets regular mammograms, last colonoscopy 5 years ago with one polyp.  Past Medical History:  Diagnosis Date  . Anemia   . Anxiety   . Aortic stenosis    a. Now has St Jude mechanical aortic valve (6/00).b. Echo (6/15) with EF 60-65%, mechanical aortic valve with mean gradient 27 mmHg.  . Arthritis    a. Possible c-spine arthritis with pain down left arm.   . Carotid artery disease ([place])    a. Carotid dopplers (7/16) with 40-59% BICA stenosis.   . Chronic angle-closure glaucoma(365.23)   . Chronic diastolic CHF (congestive heart failure) ([place])    a.  RHC (7/15) with mean RA 2, PA 22/11, mean PCWP 9, CI 3.79.  Marland Kitchen Colon polyps   . Contrast media allergy   . Coronary atherosclerosis of native coronary artery    a. CABG at time of AVR in 6/00 with RIMA-RCA.b. Abnl nuc 11/2013 -> LHC (7/15) with patent RIMA-RCA, 80% mRCA, 80% pLAD with FFR 0.73, treated with DES to pLAD.  Marland Kitchen Depression   . Dizziness 02/09/2012   a. Holter (8/14) with few PACs, otherwise unremarkable.   . Elevated transaminase level   . Essential hypertension   . Gastritis   . GERD (gastroesophageal reflux disease)   . Hyperlipidemia   . Low back pain   . Mechanical heart valve present   . Neuromuscular disorder (HCC)    neuropathy  . Peripheral vascular disease ([place])    a, H/o Right SFA stent. b. Peripheral arterial dopplers (7/16) with right SFA stent patent.   Marland Kitchen PONV (postoperative nausea and vomiting)    N&V  . Pulmonary nodules    a. Noted by CT in 6/15. CT chest (8/15)  was thought to indicate that nodules were benign. No further workup was recommended.   . Syncope 03/29/2012   carotid doppler - R ICA 50-69% reduction by velocities (low end); L ICA 0-49% reduction by velocities (low end); R and L subclavian arteries - <50% redcution; R and L vertebral arteries show normal antegrade flow  . Type II diabetes mellitus ([place])     Current Outpatient Medications on File Prior to Visit  Medication Sig Dispense Refill  . ALPRAZolam (XANAX) 0.5 MG tablet Take 1 tablet (0.5 mg total) by mouth at bedtime. 15 tablet 0  . Ascorbic Acid (VITAMIN C) 500 MG tablet Take 500 mg by mouth 2 (two) times daily.     Marland Kitchen aspirin EC 81 MG tablet Take 1 tablet (81 mg total) by mouth daily.    . benzonatate (TESSALON) 100 MG capsule Take 1 capsule (100 mg total) by mouth 3 (three) times daily as needed for cough. 30 capsule 0  . calcium-vitamin D (CALCIUM 500+D) 500-200 MG-UNIT per tablet Take 1 tablet by mouth 2 (two) times daily.     . citalopram (CELEXA) 20 MG tablet TAKE ONE TABLET BY MOUTH EVERY NIGHT AT BEDTIME 30 tablet 0  . ferrous sulfate 325 (65 FE) MG tablet Take 1 tablet (325 mg total) by mouth daily. 30 tablet 0  . folic  acid (FOLVITE) 1 MG tablet Take 1 mg by mouth daily.    Marland Kitchen gabapentin (NEURONTIN) 300 MG capsule TAKE ONE CAPSULE BY MOUTH THREE TIMES A DAY 90 capsule 0  . latanoprost (XALATAN) 0.005 % ophthalmic solution PLACE 1 DROP INTO BOTH EYES AT BEDTIME. 2.5 mL 2  . metFORMIN (GLUCOPHAGE) 1000 MG tablet TAKE 1 TABLET (1,000 MG TOTAL) BY MOUTH 2 (TWO) TIMES DAILY WITH A MEAL. 60 tablet 10  . metoprolol succinate (TOPROL-XL) 50 MG 24 hr tablet TAKE ONE TABLET BY MOUTH DAILY 64 tablet 2  . nitroGLYCERIN (NITROSTAT) 0.4 MG SL tablet Place 1 tablet (0.4 mg total) under the tongue every 5 (five) minutes as needed for chest pain. 25 tablet 3  . nystatin cream (MYCOSTATIN) Apply 1 application topically 2 (two) times daily. 30 g 1  . pantoprazole (PROTONIX) 40 MG tablet Take 1  tablet (40 mg total) by mouth daily. 30 tablet 11  . Potassium Chloride ER 20 MEQ TBCR TAKE THREE TABLETS BY MOUTH EVERY MORNING AND TAKE TWO TABLETS BY MOUTH EVERY EVENING 450 tablet 2  . rosuvastatin (CRESTOR) 20 MG tablet Take 1 tablet (20 mg total) by mouth daily. 90 tablet 3  . torsemide (DEMADEX) 20 MG tablet TAKE THREE TABLETS BY MOUTH EVERY MORNING TAKE TWO TABLETS BY MOUTH EVERY EVENING 320 tablet 2  . traZODone (DESYREL) 50 MG tablet TAKE 1/2 TO 1 TABLET BY MOUTH EVERY NIGHT AT BEDTIME AS NEEDED  FOR SLEEP 21 tablet 0  . warfarin (COUMADIN) 3 MG tablet TAKE 1/2 TO 1 TABLET BY MOUTH DAILY AS DIRECTED 30 tablet 0   No current facility-administered medications on file prior to visit.     Past Surgical History:  Procedure Laterality Date  . BILATERAL OOPHORECTOMY  1987  . BREAST BIOPSY  2012   left  . CARDIAC CATHETERIZATION  01/14/1999   normal LV function, severe aortic stenosis; 80% and 70% stenosis in RCA; mild 20% distal norrowing in L main with 20% proximal LAD stenosis, 40% diagonal stenosis and 20% proximal circumflex stenosis  . CARDIAC VALVE REPLACEMENT  2000   aortic valve   . CARDIOVASCULAR STRESS TEST  08/25/2011   R/L MV - EF 72%; no scintigraphic evidence of inducible MI; normal perfusionTID of 1.25 elevated - could indicate small vessle subendocardial ischemia; EKG NSR at 66, non diagnostic for ischemia  . [place]   bilaterally  . CATARACT EXTRACTION W/ INTRAOCULAR LENS  IMPLANT, BILATERAL  ~ 2007  . [place]; 1971  . CHOLECYSTECTOMY  1990  . COLONOSCOPY N/A 10/27/2012   Procedure: COLONOSCOPY;  Surgeon: Beryle Beams, MD;  Location: [facility] ENDOSCOPY;  Service: Endoscopy;  Laterality: N/A;  . CORONARY ARTERY BYPASS GRAFT  2000   RIMA to RCA  . DOPPLER ECHOCARDIOGRAPHY  03/29/2012   EF >55%; mild concentric LVH; stage 1 diastolic dysfunction, elevated LV filling pressure, dilated LA; MAC mild MR; St Jude AVR peak and mean gradients of  97mHg and 232mg; transvalvular gradients have increased (prev 23 and 14 respectively)  . ESOPHAGOGASTRODUODENOSCOPY N/A 10/27/2012   Procedure: ESOPHAGOGASTRODUODENOSCOPY (EGD);  Surgeon: PaBeryle BeamsMD;  [facility];  Service: Endoscopy;  Laterality: N/A;  . FEMORAL ARTERY STENT  09/20/2011   6 x 40 Smart Nitinol self-expanding stent placed;  10/15/2031 -R SFA stent open and patent w/o evidence of restenosis  . FRACTIONAL FLOW RESERVE WIRE  12/14/2013   Procedure: FRACTIONAL FLOW RESERVE WIRE;  Surgeon: DaLarey DresserMD;  Location: [facility] CATH LAB;  Service: Cardiovascular;;  . LACERATION REPAIR     right hand  . LEFT AND RIGHT HEART CATHETERIZATION WITH CORONARY ANGIOGRAM N/A 12/14/2013   Procedure: LEFT AND RIGHT HEART CATHETERIZATION WITH CORONARY ANGIOGRAM;  Surgeon: Larey Dresser, MD;  Location: [facility] CATH LAB;  Service: Cardiovascular;  Laterality: N/A;  . LOWER EXTREMITY ANGIOGRAM N/A 09/20/2011   Procedure: LOWER EXTREMITY ANGIOGRAM;  Surgeon: Lorretta Harp, MD;  Location: [facility] CATH LAB;  Service: Cardiovascular;  Laterality: N/A;  . LYMPH NODE BIOPSY  06/2011   "core needle on 5"  . needle biopsy  2009   "on ankles for nerve damage"  . NEUROPLASTY / TRANSPOSITION ULNAR NERVE AT ELBOW     right  . PERCUTANEOUS CORONARY STENT INTERVENTION (PCI-S)  12/14/2013   Procedure: PERCUTANEOUS CORONARY STENT INTERVENTION (PCI-S);  Surgeon: Larey Dresser, MD;  Location: [facility] CATH LAB;  Service: Cardiovascular;;  Prox LAD 3.00x12 Promus DES   . PERIPHERAL ARTERIAL STENT GRAFT  09/20/11   right SFA  . REFRACTIVE SURGERY  ` 2004   "for glaucoma"  . TONSILLECTOMY  1968  . TRIGGER FINGER RELEASE Left 12/04/2015   Procedure: LEFT RING FINGER TRIGGER RELEASE;  Surgeon: Leanora Cover, MD;  Location: [place];  Service: Orthopedics;  Laterality: Left;  Marland Kitchen VAGINAL HYSTERECTOMY  1985   Fibroids    Allergies  Allergen Reactions  . Atorvastatin     Muscle pain  . Simvastatin  Other (See Comments)    Muscle pain  . Sulfa Antibiotics     unknown  . Iodinated Diagnostic Agents Rash     Red rash after cardiac cath 1 wk ago, ? Contrast allergy, requires 13 hr prep now per [email or web address]    Social History   Socioeconomic History  . Marital status: Married    Spouse name: [name]  . Number of children: 2  . Years of education: 42  . Highest education level: Not on file  Occupational History    Comment: hair dresser  Social Needs  . Financial resource strain: Not on file  . Food insecurity:    Worry: Not on file    Inability: Not on file  . Transportation needs:    Medical: Not on file    Non-medical: Not on file  Tobacco Use  . Smoking status: Never Smoker  . Smokeless tobacco: Never Used  Substance and Sexual Activity  . Alcohol use: No  . Drug use: No  . Sexual activity: Never  Lifestyle  . Physical activity:    Days per week: Not on file    Minutes per session: Not on file  . Stress: Not on file  Relationships  . Social connections:    Talks on phone: Not on file    Gets together: Not on file    Attends religious service: Not on file    Active member of club or organization: Not on file    Attends meetings of clubs or organizations: Not on file    Relationship status: Not on file  . Intimate partner violence:    Fear of current or ex partner: Not on file    Emotionally abused: Not on file    Physically abused: Not on file    Forced sexual activity: Not on file  Other Topics Concern  . Not on file  Social History Narrative   Pt is a high school graduate with 2 years of college. Married in 1967 she has 1 son born  73 and 1 daughter born 72 and 1 grandchild. 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      Caffeine use- 2-3 /day, soda, tea     Family History  Problem Relation Age of Onset  . Colon cancer Mother   . COPD Mother   . Emphysema Mother   . Cancer Maternal Grandmother   . Cancer Maternal  Grandfather   . Heart disease Brother   . Diabetes Neg Hx     BP 128/63   Pulse 68   Ht _0  (1.575 m)   Wt 185 lb (83.9 kg)   BMI 33.84 kg/m   Review of Systems: See HPI above.     Objective:  Physical Exam:  Gen: NAD, comfortable in exam room  Right shoulder/Chest: More prominent right [place] compared to left.  No crepitus, pitting edema.  No redness. Minimal TTP right [place].  No other tenderness. FROM without pain. Negative [place], [place]. Negative Yergasons. Strength 5/5 with empty can and resisted internal/external rotation. NV intact distally.   Left shoulder: No deformity. FROM with 5/5 strength. No tenderness to palpation. NVI distally.  Assessment & Plan:  1. Right sternoclavicular joint swelling - patient reports this is new.  No evidence of infection.  No prior cancer history and has undergone regular surveillance.  Reviewed a CT from 12/2013 and alignment was normal, no abnormalities seen at [facility] or proximal clavicle.  Independently reviewed radiographs and no abnormalities though difficult to evaluate the  on radiographs.  Advised we go ahead with MRI to further assess, rule out malignancy.  She cannot tolerate contrast.  Tylenol if she has some soreness.

## 2017-12-13 NOTE — Assessment & Plan Note (Signed)
Right sternoclavicular joint swelling - patient reports this is new.  No evidence of infection.  No prior cancer history and has undergone regular surveillance.  Reviewed a CT from 12/2013 and alignment was normal, no abnormalities seen at [facility] or proximal clavicle.  Independently reviewed radiographs and no abnormalities though difficult to evaluate the [place] on radiographs.  Advised we go ahead with MRI to further assess, rule out malignancy.  She cannot tolerate contrast.  Tylenol if she has some soreness.

## 2017-12-15 ENCOUNTER — Other Ambulatory Visit: Payer: Self-pay

## 2017-12-15 ENCOUNTER — Encounter: Payer: Self-pay | Admitting: Family Medicine

## 2017-12-15 NOTE — Addendum Note (Signed)
Addended by: Sherrie George F on: 12/15/2017 08:07 AM   Modules accepted: Orders

## 2017-12-19 ENCOUNTER — Ambulatory Visit
Admission: RE | Admit: 2017-12-19 | Discharge: 2017-12-19 | Disposition: A | Discharge status: Home / Self Care | Payer: Medicare Other | Source: Ambulatory Visit | Attending: Family Medicine | Admitting: Family Medicine

## 2017-12-19 DIAGNOSIS — R0789 Other chest pain: Secondary | ICD-10-CM

## 2017-12-21 ENCOUNTER — Ambulatory Visit (INDEPENDENT_AMBULATORY_CARE_PROVIDER_SITE_OTHER): Payer: Medicare Other

## 2017-12-21 DIAGNOSIS — E538 Deficiency of other specified B group vitamins: Secondary | ICD-10-CM

## 2017-12-21 MED ORDER — CYANOCOBALAMIN 1000 MCG/ML IJ SOLN
1000.0000 ug | Freq: Once | INTRAMUSCULAR | Status: AC
  Administered 2017-12-21: 1000 ug via INTRAMUSCULAR

## 2017-12-21 NOTE — Progress Notes (Signed)
Pre visit review using our clinic tool,if applicable. No additional management support is needed unless otherwise documented below in the visit note.  Pt here for monthly B12 injection per order from [facility]  B12 1000 mcg given IM left arm,  pt tolerated injection well.  No complaints voiced this shift.  Next B12 injection scheduled for January 17, 2018 .

## 2017-12-28 ENCOUNTER — Other Ambulatory Visit: Payer: Self-pay | Admitting: Medical

## 2018-01-03 ENCOUNTER — Telehealth: Payer: Self-pay | Admitting: Family Medicine

## 2018-01-03 NOTE — Telephone Encounter (Signed)
Spoke to patient and placed new order for MRI. Told patient that she will be contacted to schedule an appointment.

## 2018-01-03 NOTE — Addendum Note (Signed)
Addended by: Sherrie George F on: 01/03/2018 02:19 PM   Modules accepted: Orders

## 2018-01-03 NOTE — Telephone Encounter (Signed)
-----   Message from Ophelia Shoulder, [place] sent at 01/02/2018  4:26 PM EDT ----- Regarding: RE: mri Spoke to [place] and they said that she did not come in at all. ----- Message ----- From: Dene Gentry, MD Sent: 01/02/2018   4:17 PM EDT To: Ophelia Shoulder, CMA Subject: RE: mri                                        Can you see if they read the MRI then?  They may have gotten enough images for a read.  It's weird they didn't contact us to let us know.  ----- Message ----- From: Ophelia Shoulder, CMA Sent: 01/02/2018   4:09 PM EDT To: Dene Gentry, MD Subject: RE: [place] to patient and she stated that she had it done about 2 weeks ago. After 20 minutes the mri was stopped. Patient would like to know if she can have a CT scan done instead of a MRI. If MRI, she would like to go to a different place.  ----- Message ----- From: Dene Gentry, MD Sent: 01/02/2018   3:49 PM EDT To: Ophelia Shoulder, CMA Subject: mri                                            I know you said she had scheduled then canceled this but have you talked to her about this?

## 2018-01-03 NOTE — Telephone Encounter (Signed)
Well I mean we can but the concern is MRI is typically better at evaluating the joint.  CT is primarily for bone.  But if she can't tolerate MRI you can go ahead with CT without contrast.

## 2018-01-04 NOTE — Addendum Note (Signed)
Addended by: Dene Gentry on: 01/04/2018 10:10 AM   Modules accepted: Orders

## 2018-01-07 ENCOUNTER — Ambulatory Visit (HOSPITAL_BASED_OUTPATIENT_CLINIC_OR_DEPARTMENT_OTHER)
Admission: RE | Admit: 2018-01-07 | Discharge: 2018-01-07 | Disposition: A | Discharge status: Home / Self Care | Payer: Medicare Other | Source: Ambulatory Visit | Attending: Family Medicine | Admitting: Family Medicine

## 2018-01-07 DIAGNOSIS — R222 Localized swelling, mass and lump, trunk: Secondary | ICD-10-CM | POA: Diagnosis not present

## 2018-01-07 DIAGNOSIS — R0789 Other chest pain: Secondary | ICD-10-CM | POA: Diagnosis not present

## 2018-01-17 ENCOUNTER — Ambulatory Visit: Payer: Medicare Other

## 2018-01-24 ENCOUNTER — Other Ambulatory Visit (HOSPITAL_COMMUNITY): Payer: Self-pay | Admitting: Cardiology

## 2018-01-24 ENCOUNTER — Other Ambulatory Visit: Payer: Self-pay | Admitting: Medical

## 2018-02-20 ENCOUNTER — Other Ambulatory Visit: Payer: Self-pay | Admitting: Medical

## 2018-02-23 ENCOUNTER — Ambulatory Visit (INDEPENDENT_AMBULATORY_CARE_PROVIDER_SITE_OTHER): Payer: Medicare Other | Admitting: Pharmacist Clinician (PhC)/ Clinical Pharmacy Specialist

## 2018-02-23 ENCOUNTER — Other Ambulatory Visit: Payer: Self-pay | Admitting: Medical

## 2018-02-23 DIAGNOSIS — Z952 Presence of prosthetic heart valve: Secondary | ICD-10-CM

## 2018-02-23 DIAGNOSIS — Z7901 Long term (current) use of anticoagulants: Secondary | ICD-10-CM | POA: Diagnosis not present

## 2018-02-23 LAB — POCT INR: INR: 2.5 (ref 2.0–3.0)

## 2018-02-24 NOTE — Telephone Encounter (Signed)
Refill Request:Alpazolam  Last RX: 12/07/17 Last OV:12/07/17 Next OV:107/19 [id number] seen in chart [id number] seen in chart  CSR:

## 2018-02-26 NOTE — Telephone Encounter (Signed)
I sent rx xanax to pharmacy. When she is in for ear lavage will get uds and place on contract.

## 2018-02-27 ENCOUNTER — Ambulatory Visit: Payer: Medicare Other | Admitting: Medical

## 2018-03-07 ENCOUNTER — Encounter: Payer: Self-pay | Admitting: Medical

## 2018-03-14 DIAGNOSIS — Z23 Encounter for immunization: Secondary | ICD-10-CM | POA: Diagnosis not present

## 2018-03-14 DIAGNOSIS — E119 Type 2 diabetes mellitus without complications: Secondary | ICD-10-CM | POA: Diagnosis not present

## 2018-03-14 DIAGNOSIS — Z5181 Encounter for therapeutic drug level monitoring: Secondary | ICD-10-CM | POA: Diagnosis not present

## 2018-03-16 ENCOUNTER — Ambulatory Visit (HOSPITAL_BASED_OUTPATIENT_CLINIC_OR_DEPARTMENT_OTHER)
Admission: RE | Admit: 2018-03-16 | Discharge: 2018-03-16 | Disposition: A | Discharge status: Home / Self Care | Payer: Medicare Other | Source: Ambulatory Visit | Attending: Medical | Admitting: Medical

## 2018-03-16 ENCOUNTER — Ambulatory Visit (INDEPENDENT_AMBULATORY_CARE_PROVIDER_SITE_OTHER): Payer: Medicare Other | Admitting: Medical

## 2018-03-16 ENCOUNTER — Encounter: Payer: Self-pay | Admitting: Medical

## 2018-03-16 VITALS — BP 120/44 | HR 60 | Temp 98.3°F | Resp 16 | Ht 62.0 in | Wt 193.8 lb

## 2018-03-16 DIAGNOSIS — M545 Low back pain, unspecified: Secondary | ICD-10-CM

## 2018-03-16 DIAGNOSIS — F32A Depression, unspecified: Secondary | ICD-10-CM

## 2018-03-16 DIAGNOSIS — F419 Anxiety disorder, unspecified: Secondary | ICD-10-CM

## 2018-03-16 DIAGNOSIS — R5383 Other fatigue: Secondary | ICD-10-CM | POA: Diagnosis not present

## 2018-03-16 DIAGNOSIS — R42 Dizziness and giddiness: Secondary | ICD-10-CM

## 2018-03-16 DIAGNOSIS — F329 Major depressive disorder, single episode, unspecified: Secondary | ICD-10-CM

## 2018-03-16 DIAGNOSIS — G8929 Other chronic pain: Secondary | ICD-10-CM

## 2018-03-16 DIAGNOSIS — E538 Deficiency of other specified B group vitamins: Secondary | ICD-10-CM

## 2018-03-16 LAB — COMPREHENSIVE METABOLIC PANEL
ALT: 16 U/L (ref 0–35)
AST: 21 U/L (ref 0–37)
Albumin: 4.4 g/dL (ref 3.5–5.2)
Alkaline Phosphatase: 56 U/L (ref 39–117)
BUN: 19 mg/dL (ref 6–23)
CO2: 28 mEq/L (ref 19–32)
Calcium: 9.8 mg/dL (ref 8.4–10.5)
Chloride: 97 mEq/L (ref 96–112)
Creatinine, Ser: 1.3 mg/dL — ABNORMAL HIGH (ref 0.40–1.20)
GFR: 42.73 mL/min — ABNORMAL LOW (ref >60.00)
Glucose, Bld: 269 mg/dL — ABNORMAL HIGH (ref 70–99)
Potassium: 4.3 mEq/L (ref 3.5–5.1)
Sodium: 138 mEq/L (ref 135–145)
Total Bilirubin: 1.1 mg/dL (ref 0.2–1.2)
Total Protein: 7.1 g/dL (ref 6.0–8.3)

## 2018-03-16 LAB — CBC WITH DIFFERENTIAL/PLATELET
Basophils Absolute: 0 10*3/uL (ref 0.0–0.1)
Basophils Relative: 0.8 % (ref 0.0–3.0)
Eosinophils Absolute: 0.1 10*3/uL (ref 0.0–0.7)
Eosinophils Relative: 1.7 % (ref 0.0–5.0)
HCT: 32.4 % — ABNORMAL LOW (ref 36.0–46.0)
Hemoglobin: 10.9 g/dL — ABNORMAL LOW (ref 12.0–15.0)
Lymphocytes Relative: 29.9 % (ref 12.0–46.0)
Lymphs Abs: 1.6 10*3/uL (ref 0.7–4.0)
MCHC: 33.6 g/dL (ref 30.0–36.0)
MCV: 87.8 fl (ref 78.0–100.0)
Monocytes Absolute: 0.4 10*3/uL (ref 0.1–1.0)
Monocytes Relative: 8.1 % (ref 3.0–12.0)
Neutro Abs: 3.1 10*3/uL (ref 1.4–7.7)
Neutrophils Relative %: 59.5 % (ref 43.0–77.0)
Platelets: 142 10*3/uL — ABNORMAL LOW (ref 150.0–400.0)
RBC: 3.69 Mil/uL — ABNORMAL LOW (ref 3.87–5.11)
RDW: 16.6 % — ABNORMAL HIGH (ref 11.5–15.5)
WBC: 5.2 10*3/uL (ref 4.0–10.5)

## 2018-03-16 LAB — VITAMIN B12: Vitamin B-12: >1525 pg/mL — ABNORMAL HIGH (ref 211–911)

## 2018-03-16 LAB — TSH: TSH: 2.67 u[IU]/mL (ref 0.35–4.50)

## 2018-03-16 MED ORDER — ALPRAZOLAM 0.5 MG PO TABS: 0.5000 mg | ORAL_TABLET | Freq: Every day | ORAL | 0 refills | Status: DC

## 2018-03-16 MED ORDER — CYANOCOBALAMIN 1000 MCG/ML IJ SOLN
1000.0000 ug | Freq: Once | INTRAMUSCULAR | Status: AC
  Administered 2018-03-16: 1000 ug via INTRAMUSCULAR

## 2018-03-16 MED ORDER — VENLAFAXINE HCL ER 37.5 MG PO CP24: 37.5000 mg | ORAL_CAPSULE | Freq: Every day | ORAL | 0 refills | Status: DC

## 2018-03-16 MED ORDER — MECLIZINE HCL 12.5 MG PO TABS: 12.5000 mg | ORAL_TABLET | Freq: Three times a day (TID) | ORAL | 0 refills | Status: DC | PRN

## 2018-03-16 NOTE — Progress Notes (Signed)
Subjective:    Patient ID: Abigail Oliver, female    DOB: 01/26/1946, 72 y.o.   MRN: [id number]  HPI  Pt in with ear feeling blocked.Needs wax removed before able to do hearing test.  Pt has moderate fatigue. Does have low b12. Pt had two b12 injections over summer.  She does have back pain history. She states ruptured disc in past and stenosis. Worse pain with cold weather. 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 Pt states has seen [facility] her back. 2-3 years since last had imaging studies. Pt states told not good candidate for surgery. They advised take it easy and not work so much. She only works 2.5 days a week. Pain in back can be severe at times. So high that prevents her from working.  She states her mood is getting worse with the above pain. She state depressed. Pt thinks celexa is not working. She has been on for a while and feels like never really worked. Pt never tried Brewing technologist. She states xanax helps her anxiety.    Some dizziness x 2 weeks. No gross motor or sensory function deficits. See hpi. Some wax obstruction.    Review of Systems  Constitutional: Positive for fatigue. Negative for chills and fever.  HENT: Negative for congestion, drooling, ear pain, nosebleeds, postnasal drip, sinus pain, sneezing and sore throat.        Ears feels blocked.   Only used debrox one time,  Respiratory: Negative for cough, chest tightness, shortness of breath and wheezing.   Cardiovascular: Negative for chest pain and palpitations.  Gastrointestinal: Negative for abdominal pain.  Genitourinary: Negative for difficulty urinating, enuresis, flank pain, frequency and hematuria.  Musculoskeletal: Positive for back pain.  Skin: Negative for rash.  Neurological: Positive for dizziness. Negative for seizures, syncope, speech difficulty, weakness and headaches.       For 2 weeks. When turns worsening when turns head. No gross motor or sensory function  deficits.  Hematological: Negative for adenopathy. Does not bruise/bleed easily.  Psychiatric/Behavioral: Negative for behavioral problems, decreased concentration and suicidal ideas. The patient is not nervous/anxious.      Past Medical History:  Diagnosis Date  . Anemia   . Anxiety   . Aortic stenosis    a. Now has St Jude mechanical aortic valve (6/00).b. Echo (6/15) with EF 60-65%, mechanical aortic valve with mean gradient 27 mmHg.  . Arthritis    a. Possible c-spine arthritis with pain down left arm.   . Carotid artery disease ([place])    a. Carotid dopplers (7/16) with 40-59% BICA stenosis.   . Chronic angle-closure glaucoma(365.23)   . Chronic diastolic CHF (congestive heart failure) ([place])    a.  RHC (7/15) with mean RA 2, PA 22/11, mean PCWP 9, CI 3.79.  Marland Kitchen Colon polyps   . Contrast media allergy   . Coronary atherosclerosis of native coronary artery    a. CABG at time of AVR in 6/00 with RIMA-RCA.b. Abnl nuc 11/2013 -> LHC (7/15) with patent RIMA-RCA, 80% mRCA, 80% pLAD with FFR 0.73, treated with DES to pLAD.  Marland Kitchen Depression   . Dizziness 02/09/2012   a. Holter (8/14) with few PACs, otherwise unremarkable.   . Elevated transaminase level   . Essential hypertension   . Gastritis   . GERD (gastroesophageal reflux disease)   . Hyperlipidemia   . Low back pain   . Mechanical heart valve present   . Neuromuscular disorder ([place])  neuropathy  . Peripheral vascular disease ([place])    a, H/o Right SFA stent. b. Peripheral arterial dopplers (7/16) with right SFA stent patent.   Marland Kitchen PONV (postoperative nausea and vomiting)    N&V  . Pulmonary nodules    a. Noted by CT in 6/15. CT chest (8/15) was thought to indicate that nodules were benign. No further workup was recommended.   . Syncope 03/29/2012   carotid doppler - R ICA 50-69% reduction by velocities (low end); L ICA 0-49% reduction by velocities (low end); R and L subclavian arteries - <50% redcution; R and L vertebral  arteries show normal antegrade flow  . Type II diabetes mellitus ([place])      Social History   Socioeconomic History  . Marital status: Married    Spouse name: [name]  . Number of children: 2  . Years of education: 79  . Highest education level: Not on file  Occupational History    Comment: hair dresser  Social Needs  . Financial resource strain: Not on file  . Food insecurity:    Worry: Not on file    Inability: Not on file  . Transportation needs:    Medical: Not on file    Non-medical: Not on file  Tobacco Use  . Smoking status: Never Smoker  . Smokeless tobacco: Never Used  Substance and Sexual Activity  . Alcohol use: No  . Drug use: No  . Sexual activity: Never  Lifestyle  . Physical activity:    Days per week: Not on file    Minutes per session: Not on file  . Stress: Not on file  Relationships  . Social connections:    Talks on phone: Not on file    Gets together: Not on file    Attends religious service: Not on file    Active member of club or organization: Not on file    Attends meetings of clubs or organizations: Not on file    Relationship status: Not on file  . Intimate partner violence:    Fear of current or ex partner: Not on file    Emotionally abused: Not on file    Physically abused: Not on file    Forced sexual activity: Not on file  Other Topics Concern  . Not on file  Social History Narrative   Pt is a high school graduate with 2 years of college. Married in 1967 she has 1 son born 42 and 1 daughter born 66 and 1 grandchild. 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      Caffeine use- 2-3 /day, soda, tea     Past Surgical History:  Procedure Laterality Date  . BILATERAL OOPHORECTOMY  1987  . BREAST BIOPSY  2012   left  . CARDIAC CATHETERIZATION  01/14/1999   normal LV function, severe aortic stenosis; 80% and 70% stenosis in RCA; mild 20% distal norrowing in L main with 20% proximal LAD stenosis, 40% diagonal  stenosis and 20% proximal circumflex stenosis  . CARDIAC VALVE REPLACEMENT  2000   aortic valve   . CARDIOVASCULAR STRESS TEST  08/25/2011   R/L MV - EF 72%; no scintigraphic evidence of inducible MI; normal perfusionTID of 1.25 elevated - could indicate small vessle subendocardial ischemia; EKG NSR at 66, non diagnostic for ischemia  . [place]   bilaterally  . CATARACT EXTRACTION W/ INTRAOCULAR LENS  IMPLANT, BILATERAL  ~ 2007  . CESAREAN SECTION  1969; 1971  . CHOLECYSTECTOMY  1990  . COLONOSCOPY N/A 10/27/2012   Procedure: COLONOSCOPY;  Surgeon: Beryle Beams, MD;  Location: [facility];  Service: Endoscopy;  Laterality: N/A;  . CORONARY ARTERY BYPASS GRAFT  2000   RIMA to RCA  . DOPPLER ECHOCARDIOGRAPHY  03/29/2012   EF >55%; mild concentric LVH; stage 1 diastolic dysfunction, elevated LV filling pressure, dilated LA; MAC mild MR; St Jude AVR peak and mean gradients of 37mHg and 27mg; transvalvular gradients have increased (prev 23 and 14 respectively)  . ESOPHAGOGASTRODUODENOSCOPY N/A 10/27/2012   Procedure: ESOPHAGOGASTRODUODENOSCOPY (EGD);  Surgeon: PaBeryle BeamsMD;  [facility];  Service: Endoscopy;  Laterality: N/A;  . FEMORAL ARTERY STENT  09/20/2011   6 x 40 Smart Nitinol self-expanding stent placed;  10/15/2031 -R SFA stent open and patent w/o evidence of restenosis  . FRACTIONAL FLOW RESERVE WIRE  12/14/2013   Procedure: FRACTIONAL FLOW RESERVE WIRE;  Surgeon: DaLarey DresserMD;  [facility];  Service: Cardiovascular;;  . LACERATION REPAIR     right hand  . LEFT AND RIGHT HEART CATHETERIZATION WITH CORONARY ANGIOGRAM N/A 12/14/2013   Procedure: LEFT AND RIGHT HEART CATHETERIZATION WITH CORONARY ANGIOGRAM;  Surgeon: DaLarey DresserMD;  [facility];  Service: Cardiovascular;  Laterality: N/A;  . LOWER EXTREMITY ANGIOGRAM N/A 09/20/2011   Procedure: LOWER EXTREMITY ANGIOGRAM;  Surgeon: JoLorretta HarpMD;  [facility];  Service: Cardiovascular;  Laterality: N/A;  . LYMPH NODE BIOPSY  06/2011   "core needle on 5"  . needle biopsy  2009   "on ankles for nerve damage"  . NEUROPLASTY / TRANSPOSITION ULNAR NERVE AT ELBOW     right  . PERCUTANEOUS CORONARY STENT INTERVENTION (PCI-S)  12/14/2013   Procedure: PERCUTANEOUS CORONARY STENT INTERVENTION (PCI-S);  Surgeon: DaLarey DresserMD;  [facility];  Service: Cardiovascular;;  Prox LAD 3.00x12 Promus DES   . PERIPHERAL ARTERIAL STENT GRAFT  09/20/11   right SFA  . REFRACTIVE SURGERY  ` 2004   "for glaucoma"  . TONSILLECTOMY  1968  . TRIGGER FINGER RELEASE Left 12/04/2015   Procedure: LEFT RING FINGER TRIGGER RELEASE;  Surgeon: KeLeanora CoverMD;  [facility];  Laterality: Left;  . Marland KitchenAGINAL HYSTERECTOMY  1985   Fibroids    Family History  Problem Relation Age of Onset  . Colon cancer Mother   . COPD Mother   . Emphysema Mother   . Cancer Maternal Grandmother   . Cancer Maternal Grandfather   . Heart disease Brother   . Diabetes Neg Hx     Allergies  Allergen Reactions  . Atorvastatin     Muscle pain  . Simvastatin Other (See Comments)    Muscle pain  . Sulfa Antibiotics     unknown  . Iodinated Diagnostic Agents Rash     Red rash after cardiac cath 1 wk ago, ? Contrast allergy, requires 13 hr prep now per [email or web address]    Current Outpatient Medications on File Prior to Visit  Medication Sig Dispense Refill  . ALPRAZolam (XANAX) 0.5 MG tablet TAKE ONE TABLET BY MOUTH AT BEDTIME 10 tablet 0  . Ascorbic Acid (VITAMIN C) 500 MG tablet Take 500 mg by mouth 2 (two) times daily.     . Marland Kitchenspirin EC 81 MG tablet Take 1 tablet (81 mg total) by mouth daily.    . calcium-vitamin D (CALCIUM 500+D) 500-200 MG-UNIT per tablet  Take 1 tablet by mouth 2 (two) times daily.     . citalopram (CELEXA) 20 MG tablet TAKE ONE TABLET BY MOUTH EVERY NIGHT AT BEDTIME 30 tablet 0  . ferrous sulfate 325  (65 FE) MG tablet Take 1 tablet (325 mg total) by mouth daily. 30 tablet 0  . folic acid (FOLVITE) 1 MG tablet Take 1 mg by mouth daily.    Marland Kitchen latanoprost (XALATAN) 0.005 % ophthalmic solution PLACE 1 DROP INTO BOTH EYES AT BEDTIME. 2.5 mL 2  . metFORMIN (GLUCOPHAGE) 1000 MG tablet TAKE 1 TABLET (1,000 MG TOTAL) BY MOUTH 2 (TWO) TIMES DAILY WITH A MEAL. 60 tablet 10  . metoprolol succinate (TOPROL-XL) 50 MG 24 hr tablet TAKE ONE TABLET BY MOUTH DAILY 90 tablet 0  . nitroGLYCERIN (NITROSTAT) 0.4 MG SL tablet Place 1 tablet (0.4 mg total) under the tongue every 5 (five) minutes as needed for chest pain. 25 tablet 3  . nystatin cream (MYCOSTATIN) Apply 1 application topically 2 (two) times daily. 30 g 1  . pantoprazole (PROTONIX) 40 MG tablet Take 1 tablet (40 mg total) by mouth daily. 30 tablet 11  . Potassium Chloride ER 20 MEQ TBCR TAKE THREE TABLETS BY MOUTH EVERY MORNING AND TAKE TWO TABLETS BY MOUTH EVERY EVENING 450 tablet 2  . rosuvastatin (CRESTOR) 20 MG tablet Take 1 tablet (20 mg total) by mouth daily. 90 tablet 3  . torsemide (DEMADEX) 20 MG tablet TAKE THREE TABLETS BY MOUTH EVERY MORNING TAKE TWO TABLETS BY MOUTH EVERY EVENING 450 tablet 0  . traZODone (DESYREL) 50 MG tablet TAKE 1/2 TO 1 TABLET BY MOUTH EVERY NIGHT AT BEDTIME AS NEEDED  FOR SLEEP 21 tablet 0  . traZODone (DESYREL) 50 MG tablet TAKE 1/2 TO 1 TABLET BY MOUTH EVERY NIGHT AT BEDTIME AS NEEDED  FOR SLEEP 21 tablet 0  . warfarin (COUMADIN) 3 MG tablet TAKE 1/2 TO 1 TABLET BY MOUTH DAILY AS DIRECTED 30 tablet 0   No current facility-administered medications on file prior to visit.     BP (!) 120/44   Pulse 60   Temp 98.3 F (36.8 C) (Oral)   Resp 16   Ht _0  (1.575 m)   Wt 193 lb 12.8 oz (87.9 kg)   SpO2 100%   BMI 35.45 kg/m        Objective:   Physical Exam  General Appearance- Not in acute distress.    Chest and Lung Exam Auscultation: Breath sounds:-Normal. Clear even and unlabored. Adventitious  sounds:- No Adventitious sounds.  Cardiovascular Auscultation:Rythm - Regular, rate and rythm. Heart Sounds -Normal heart sounds.  Abdomen Inspection:-Inspection Normal.  Palpation/Perucssion: Palpation and Percussion of the abdomen reveal- Non Tender, No Rebound tenderness, No rigidity(Guarding) and No Palpable abdominal masses.  Liver:-Normal.  Spleen:- Normal.   Back Mid lumbar spine tenderness to palpation. Pain on straight leg lift. Pain on lateral movements and flexion/extension of the spine.  Lower ext neurologic  L5-S1 sensation intact bilaterally. Normal patellar reflexes bilaterally. No foot drop bilaterally.   Neurologic Cranial Nerve exam:- CN III-XII intact(No nystagmus), symmetric smile. Drift Test:- No drift. Romberg Exam:- Negative.  Heal to Toe Gait exam:-Normal. Finger to Nose:- Normal/Intact Strength:- 5/5 equal and symmetric strength both upper and lower extremities.   HEENT Head- Normal. Ear Auditory Canal - Left- can't see prelavage Right - Normal.Tympanic Membrane- Left- wax blocking view of tm.. Right- Normal. Eye Sclera/Conjunctiva- Left- Normal. Right- Normal. Nose & Sinuses Nasal Mucosa- Left-  Boggy and Congested. Right-  Boggy and  Congested.Bilateral maxillary and frontal sinus pressure. Mouth & Throat Lips: Upper Lip- Normal: no dryness, cracking, pallor, cyanosis, or vesicular eruption. Lower Lip-Normal: no dryness, cracking, pallor, cyanosis or vesicular eruption. Buccal Mucosa- Bilateral- No Aphthous ulcers. Oropharynx- No Discharge or Erythema. Tonsils: Characteristics- Bilateral- No Erythema or Congestion. Size/Enlargement- Bilateral- No enlargement. Discharge- bilateral-None.           Assessment & Plan:  For your history of fatigue, we will get a CBC, CMP, TSH and repeat B12 level.  History of low B12 and will also give injection today.  For chronic low back pain, I want to repeat a lumbar spine x-ray.  After reviewing x-ray  will likely make referral to see agrees with her orthopedist.  Also after reviewing x-ray will determine what medication could give you.  Option for pain control might be Toradol but if I prescribed that we recommend infrequent use as you do occasionally use Xanax for anxiety and potential medication interaction.  For anxiety and depression, will stop Celexa and advise using Effexor.  Also will provide limited prescription of 10tablets of Xanax.  We will see how you do with the Effexor.  Considering placing you on contract for Xanax.  Discussion on that process and rules/laws associated with controlled medications.   You describe 2 weeks of dizziness and have normal neurologic exam except for slight transient dizziness when lying supine and turning head.  No vertigo noted.  I will provide you with a prescription of meclizine.  Try this and then update me on if it improves the dizziness.  If not then would consider getting CT of head.  If any dizziness with other neurologic type signs symptoms as discussed then recommend ED evaluation.  Cerumen left ear present today.  We lavaged the ear.  Follow-up in 2 weeks or as needed.  40 minutes spent with patient today.  50% of time spent counseling on various diagnoses treatment and plans.

## 2018-03-16 NOTE — Patient Instructions (Addendum)
For your history of fatigue, we will get a CBC, CMP, TSH and repeat B12 level.  History of low B12 and will also give injection today.  For chronic low back pain, I want to repeat a lumbar spine x-ray.  After reviewing x-ray will likely make referral to see agrees with her orthopedist.  Also after reviewing x-ray will determine what medication could give you.  Option for pain control might be Toradol but if I prescribed that we recommend infrequent use as you do occasionally use Xanax for anxiety and potential medication interaction.  For anxiety and depression, will stop Celexa and advise using Effexor.  Also will provide limited prescription of 10tablets of Xanax.  We will see how you do with the Effexor.  Considering placing you on contract for Xanax.  Discussion on that process and rules/laws associated with controlled medications.   You describe 2 weeks of dizziness and have normal neurologic exam except for slight transient dizziness when lying supine and turning head.  No vertigo noted.  I will provide you with a prescription of meclizine.  Try this and then update me on if it improves the dizziness.  If not then would consider getting CT of head.  If any dizziness with other neurologic type signs symptoms as discussed then recommend ED evaluation.  Cerumen left ear present today.  We lavaged the ear.  Follow-up in 2 weeks or as needed.

## 2018-03-20 ENCOUNTER — Other Ambulatory Visit: Payer: Self-pay | Admitting: Medical

## 2018-03-21 ENCOUNTER — Other Ambulatory Visit: Payer: Self-pay | Admitting: Medical

## 2018-03-21 NOTE — Telephone Encounter (Signed)
I prescribed effexor and wanted her to stop celexa. Effexor could help with her energy. I gave low dose. I would recommend offering higher dose of effexor and will see if this helps mood and gives more energy. Let me know what she says? Don't want to give both celexa and effexor. This can cause serotonin syndrome. Needs to be on one or the other but not both.

## 2018-03-21 NOTE — Telephone Encounter (Signed)
Pt requesting refill on citalopram don't see it on current med list. Please advise

## 2018-03-24 ENCOUNTER — Other Ambulatory Visit: Payer: Self-pay | Admitting: Medical

## 2018-03-30 ENCOUNTER — Ambulatory Visit (HOSPITAL_BASED_OUTPATIENT_CLINIC_OR_DEPARTMENT_OTHER)
Admission: RE | Admit: 2018-03-30 | Discharge: 2018-03-30 | Disposition: A | Discharge status: Home / Self Care | Payer: Medicare Other | Source: Ambulatory Visit | Attending: Medical | Admitting: Medical

## 2018-03-30 ENCOUNTER — Ambulatory Visit (INDEPENDENT_AMBULATORY_CARE_PROVIDER_SITE_OTHER): Payer: Medicare Other | Admitting: Medical

## 2018-03-30 ENCOUNTER — Encounter: Payer: Self-pay | Admitting: Medical

## 2018-03-30 ENCOUNTER — Other Ambulatory Visit: Payer: Self-pay | Admitting: Medical

## 2018-03-30 VITALS — BP 124/54 | HR 63 | Temp 98.2°F | Resp 16 | Ht 62.0 in | Wt 190.0 lb

## 2018-03-30 DIAGNOSIS — Z860100 Personal history of colon polyps, unspecified: Secondary | ICD-10-CM

## 2018-03-30 DIAGNOSIS — R5383 Other fatigue: Secondary | ICD-10-CM | POA: Diagnosis not present

## 2018-03-30 DIAGNOSIS — G8929 Other chronic pain: Secondary | ICD-10-CM

## 2018-03-30 DIAGNOSIS — F419 Anxiety disorder, unspecified: Secondary | ICD-10-CM | POA: Diagnosis not present

## 2018-03-30 DIAGNOSIS — M25561 Pain in right knee: Secondary | ICD-10-CM

## 2018-03-30 DIAGNOSIS — D649 Anemia, unspecified: Secondary | ICD-10-CM | POA: Diagnosis not present

## 2018-03-30 DIAGNOSIS — M25551 Pain in right hip: Secondary | ICD-10-CM

## 2018-03-30 DIAGNOSIS — M545 Low back pain, unspecified: Secondary | ICD-10-CM

## 2018-03-30 DIAGNOSIS — E1129 Type 2 diabetes mellitus with other diabetic kidney complication: Secondary | ICD-10-CM

## 2018-03-30 DIAGNOSIS — R809 Proteinuria, unspecified: Secondary | ICD-10-CM | POA: Diagnosis not present

## 2018-03-30 DIAGNOSIS — Z79899 Other long term (current) drug therapy: Secondary | ICD-10-CM | POA: Diagnosis not present

## 2018-03-30 DIAGNOSIS — R944 Abnormal results of kidney function studies: Secondary | ICD-10-CM

## 2018-03-30 LAB — COMPREHENSIVE METABOLIC PANEL
ALT: 20 U/L (ref 0–35)
AST: 27 U/L (ref 0–37)
Albumin: 4.8 g/dL (ref 3.5–5.2)
Alkaline Phosphatase: 56 U/L (ref 39–117)
BUN: 22 mg/dL (ref 6–23)
CO2: 30 mEq/L (ref 19–32)
Calcium: 9.7 mg/dL (ref 8.4–10.5)
Chloride: 101 mEq/L (ref 96–112)
Creatinine, Ser: 1.09 mg/dL (ref 0.40–1.20)
GFR: 52.36 mL/min — ABNORMAL LOW (ref >60.00)
Glucose, Bld: 183 mg/dL — ABNORMAL HIGH (ref 70–99)
Potassium: 4.2 mEq/L (ref 3.5–5.1)
Sodium: 139 mEq/L (ref 135–145)
Total Bilirubin: 1 mg/dL (ref 0.2–1.2)
Total Protein: 7.6 g/dL (ref 6.0–8.3)

## 2018-03-30 LAB — CBC WITH DIFFERENTIAL/PLATELET
Basophils Absolute: 0.1 10*3/uL (ref 0.0–0.1)
Basophils Relative: 1 % (ref 0.0–3.0)
Eosinophils Absolute: 0.1 10*3/uL (ref 0.0–0.7)
Eosinophils Relative: 1.3 % (ref 0.0–5.0)
HCT: 36 % (ref 36.0–46.0)
Hemoglobin: 11.9 g/dL — ABNORMAL LOW (ref 12.0–15.0)
Lymphocytes Relative: 30 % (ref 12.0–46.0)
Lymphs Abs: 2.1 10*3/uL (ref 0.7–4.0)
MCHC: 33.1 g/dL (ref 30.0–36.0)
MCV: 87.6 fl (ref 78.0–100.0)
Monocytes Absolute: 0.6 10*3/uL (ref 0.1–1.0)
Monocytes Relative: 9.2 % (ref 3.0–12.0)
Neutro Abs: 4 10*3/uL (ref 1.4–7.7)
Neutrophils Relative %: 58.5 % (ref 43.0–77.0)
Platelets: 164 10*3/uL (ref 150.0–400.0)
RBC: 4.11 Mil/uL (ref 3.87–5.11)
RDW: 17 % — ABNORMAL HIGH (ref 11.5–15.5)
WBC: 6.9 10*3/uL (ref 4.0–10.5)

## 2018-03-30 LAB — HEMOGLOBIN A1C: Hgb A1c MFr Bld: 6.5 % (ref 4.6–6.5)

## 2018-03-30 LAB — FERRITIN: Ferritin: 98.4 ng/mL (ref 10.0–291.0)

## 2018-03-30 MED ORDER — BUSPIRONE HCL 7.5 MG PO TABS: 7.5000 mg | ORAL_TABLET | Freq: Three times a day (TID) | ORAL | 0 refills | Status: DC

## 2018-03-30 MED ORDER — TRAMADOL HCL 50 MG PO TABS: ORAL_TABLET | ORAL | 0 refills | Status: DC

## 2018-03-30 NOTE — Patient Instructions (Addendum)
History of chronic low back pain, recent right hip for 2 more months, and right knee pain, I am going to prescribe you tramadol.  Rx advisement given.  You will give a UDS today and to sign controlled medication contract.  For anxiety, continue with Effexor and adding BuSpar.  Discontinuing Xanax due to overdose risk score.  Rx advisement given regarding serotonin syndrome and I did print that out today for you to review.  For recent fatigue, I am repeating your CBC, getting iron studies and rechecking your metabolic panel/GFR.  If your iron is low might consider referring you to hematologist for iron infusions since you have been on iron.  Please also return a stool cards to check for blood.  I did place order for you to get a repeat colonoscopy.  If no one calls you within a couple weeks please call here for update on that referral.  Or you could also called Dr.Mann office directly.  For diabetes, repeating her A1c since last lab draw your sugar was in the 200s.  Follow-up in 3 to 4 weeks or as needed.  Serotonin Syndrome Serotonin is a brain chemical that regulates the nervous system, which includes the brain, spinal cord, and nerves. Serotonin appears to play a role in all types of behavior, including appetite, emotions, movement, thinking, and response to stress. Excessively high levels of serotonin in the body can cause serotonin syndrome, which is a very dangerous condition. What are the causes? This condition can be caused by taking medicines or drugs that increase the level of serotonin in your body. These include:  Antidepressant medicines.  Migraine medicines.  Certain pain medicines.  Certain recreational drugs, including ecstasy, LSD, cocaine, and amphetamines.  Over-the-counter cough or cold medicines that contain dextromethorphan.  Certain herbal supplements, including St. John's wort, ginseng, and nutmeg.  This condition usually occurs when you take these medicines or drugs  in combination, but it can also happen with a high dose of a single medicine or drug. What increases the risk? This condition is more likely to develop in:  People who have recently increased the dosage of medicine that increases the serotonin level.  People who just started taking medicine that increases the serotonin level.  What are the signs or symptoms? Symptoms of this condition usually happens within several hours of a medicine change. Symptoms include:  Headache.  Muscle twitching or stiffness.  Diarrhea.  Confusion.  Restlessness or agitation.  Shivering or goose bumps.  Loss of muscle coordination.  Rapid heart rate.  Sweating.  Severe cases of serotonin syndromecan cause:  Irregular heartbeat.  Seizures.  Loss of consciousness.  High fever.  How is this diagnosed? This condition is diagnosed with a medical history and physical exam. You will be asked aboutyour symptoms and your use of medicines and recreational drugs. Your health care provider may also order lab work or additional tests to rule out other causes of your symptoms. How is this treated? The treatment for this condition depends on the severity of your symptoms. For mild cases, stopping the medicine that caused your condition is usually all that is needed. For moderate to severe cases, hospitalization is required to monitor you and to prevent further muscle damage. Follow these instructions at home:  Take over-the-counter and prescription medicines only as told by your health care provider. This is important.  Check with your health care provider before you start taking any new prescriptions, over-the-counter medicines, herbs, or supplements.  Avoid combining any medicines  that can cause this condition to occur.  Keep all follow-up visits as told by your health care provider.This is important.  Maintain a healthy lifestyle. ? Eat healthy foods. ? Get plenty of sleep. ? Exercise  regularly. ? Do not drink alcohol. ? Do not use recreational drugs. Contact a health care provider if:  Medicines do not seem to be helping.  Your symptoms do not improve or they get worse.  You have trouble taking care of yourself. Get help right away if:  You have worsening confusion, severe headache, chest pain, high fever, seizures, or loss of consciousness.  You have serious thoughts about hurting yourself or others.  You experience serious side effects of medicine, such as swelling of your face, lips, tongue, or throat. This information is not intended to replace advice given to you by your health care provider. Make sure you discuss any questions you have with your health care provider. Document Released: 06/17/2004 Document Revised: 01/03/2016 Document Reviewed: 05/23/2014 Elsevier Interactive Patient Education  [employer].

## 2018-03-30 NOTE — Progress Notes (Signed)
   Subjective:    Patient ID: Abigail Oliver, female    DOB: 29-Apr-1946, 72 y.o.   MRN: [id number]  HPI   Pt in for low back pain. Xray looked ok. But she does not history which she states was abnormal. Pain moderate-severe at times.  Rt knee pain new that occurred since last visit. Also reporting rt hip pain that was hurting first for past 2 months.  In addition month of rt hip pain.  Also has known anxiety and has been on xanax. Pt aware that I don't want to rx benzo and narcotics.   Pt last labs showed decreased gfr. She states she is well hydrated today. Also some anemia. She has been on iron. 3 years ago was given iron.  Pt needs colonoscopy.  Recent high sugars and needs a1c.    Review of Systems  Constitutional: Positive for fatigue. Negative for chills, fever and unexpected weight change.  Respiratory: Negative for cough and chest tightness.   Cardiovascular: Negative for chest pain.  Gastrointestinal: Negative for abdominal pain.  Genitourinary: Negative for difficulty urinating, dyspareunia, flank pain, pelvic pain and vaginal pain.  Musculoskeletal: Positive for back pain.       Knee pain  Skin: Negative for rash.  Neurological: Negative for dizziness, seizures, syncope, weakness and light-headedness.  Hematological: Negative for adenopathy. Does not bruise/bleed easily.  Psychiatric/Behavioral: Negative for behavioral problems, confusion and hallucinations. The patient is not nervous/anxious.        Objective:   Physical Exam  General- No acute distress. Pleasant patient. Neck- Full range of motion, no jvd Lungs- Clear, even and unlabored. Heart- regular rate and rhythm. Neurologic- CNII- XII grossly intact.  Rt knee- mild crepitus on flexion and extension.  Rt hip- pain on range of motion. Pain on palpation over bursae.  Back- mid lumbar spine tenderness      Assessment & Plan:  History of chronic low back pain, recent right hip for 2 more  months, and right knee pain, I am going to prescribe you tramadol.  Rx advisement given.  You will give a UDS today and to sign controlled medication contract.  For anxiety, continue with Effexor and adding BuSpar.  Discontinuing Xanax due to overdose risk score.  Rx advisement given regarding serotonin syndrome and I did print that out today for you to review.  For recent fatigue, I am repeating your CBC, getting iron studies and rechecking your metabolic panel/GFR.  If your iron is low might consider referring you to hematologist for iron infusions since you have been on iron.  Please also return a stool cards to check for blood.  I did place order for you to get a repeat colonoscopy.  If no one calls you within a couple weeks please call here for update on that referral.  Or you could also called Dr.Mann office directly.  For diabetes, repeating her A1c since last lab draw your sugar was in the 200s.  Follow-up in 3 to 4 weeks or as needed.  40 minute spent with pt. 50% of time counseled on differential dx of fatigue, controlled med policy and plan going forward. In addition counseled and educated on serotinin syndrome signs/symptoms. If were to get these stop tramadol, effexor and trazadone. Be seen ED.  [name], PA-C

## 2018-03-31 ENCOUNTER — Telehealth: Payer: Self-pay | Admitting: Medical

## 2018-03-31 MED ORDER — SAXAGLIPTIN HCL 2.5 MG PO TABS: 2.5000 mg | ORAL_TABLET | Freq: Every day | ORAL | 3 refills | Status: DC

## 2018-03-31 NOTE — Telephone Encounter (Signed)
Opened to rx meds. 

## 2018-04-01 LAB — PAIN MGMT, PROFILE 8 W/CONF, U
6 Acetylmorphine: NEGATIVE ng/mL (ref <10)
Alcohol Metabolites: NEGATIVE ng/mL (ref <500)
Alphahydroxyalprazolam: NEGATIVE ng/mL (ref <25)
Alphahydroxymidazolam: NEGATIVE ng/mL (ref <50)
Alphahydroxytriazolam: NEGATIVE ng/mL (ref <50)
Aminoclonazepam: NEGATIVE ng/mL (ref <25)
Amphetamines: NEGATIVE ng/mL (ref <500)
Benzodiazepines: NEGATIVE ng/mL (ref <100)
Buprenorphine, Urine: NEGATIVE ng/mL (ref <5)
Cocaine Metabolite: NEGATIVE ng/mL (ref <150)
Creatinine: 47.7 mg/dL
Hydroxyethylflurazepam: NEGATIVE ng/mL (ref <50)
Lorazepam: NEGATIVE ng/mL (ref <50)
MDMA: NEGATIVE ng/mL (ref <500)
Marijuana Metabolite: NEGATIVE ng/mL (ref <20)
Nordiazepam: NEGATIVE ng/mL (ref <50)
Opiates: NEGATIVE ng/mL (ref <100)
Oxazepam: NEGATIVE ng/mL (ref <50)
Oxidant: NEGATIVE ug/mL (ref <200)
Oxycodone: NEGATIVE ng/mL (ref <100)
Temazepam: NEGATIVE ng/mL (ref <50)
pH: 6.34 (ref 4.5–9.0)

## 2018-04-03 ENCOUNTER — Telehealth: Payer: Self-pay

## 2018-04-03 ENCOUNTER — Ambulatory Visit (INDEPENDENT_AMBULATORY_CARE_PROVIDER_SITE_OTHER): Payer: Medicare Other | Admitting: Pharmacist Clinician (PhC)/ Clinical Pharmacy Specialist

## 2018-04-03 DIAGNOSIS — Z952 Presence of prosthetic heart valve: Secondary | ICD-10-CM

## 2018-04-03 DIAGNOSIS — Z7901 Long term (current) use of anticoagulants: Secondary | ICD-10-CM

## 2018-04-03 LAB — POCT INR: INR: 4.5 — AB (ref 2.0–3.0)

## 2018-04-03 LAB — VITAMIN B1: Vitamin B1 (Thiamine): <6 nmol/L — ABNORMAL LOW (ref 8–30)

## 2018-04-03 NOTE — Telephone Encounter (Signed)
PA initiated via Covermymeds; KEY: [id number]. Awaiting determination.

## 2018-04-03 NOTE — Telephone Encounter (Signed)
PA approved.  PA Case: [id number], Status: Approved, Coverage Starts on: 05/22/2017, Coverage Ends on: 05/23/2018. Questions? Contact [phone].

## 2018-04-10 ENCOUNTER — Other Ambulatory Visit: Payer: Self-pay | Admitting: Medical

## 2018-04-10 ENCOUNTER — Telehealth: Payer: Self-pay | Admitting: Medical

## 2018-04-10 MED ORDER — SITAGLIPTIN PHOSPHATE 25 MG PO TABS: 25.0000 mg | ORAL_TABLET | Freq: Every day | ORAL | 3 refills | Status: DC

## 2018-04-10 NOTE — Telephone Encounter (Signed)
Copied from [place] [id number]. Topic: Quick Communication - See Telephone Encounter >> Apr 10, 2018  2:36 PM Conception Chancy, NT wrote: CRM for notification. See Telephone encounter for: 04/10/18.  Patient is calling and states her insurance does not cover saxagliptin HCl (ONGLYZA) 2.5 MG TABS tablet  and she is needing something cheaper.  [place], [place] [address] Phone: [phone] Fax: [phone]

## 2018-04-10 NOTE — Telephone Encounter (Signed)
Will try to rx januvia in place of onglyza. With insurance plans don't know what is covered until I try to rx. Or she talk with insurance company to see if they have preferred type similar to onglyza. Which Celesta Gentile is similar.

## 2018-04-10 NOTE — Telephone Encounter (Signed)
Rx [place] sent to pt pharmacy.

## 2018-04-11 DIAGNOSIS — H5201 Hypermetropia, right eye: Secondary | ICD-10-CM | POA: Diagnosis not present

## 2018-04-11 DIAGNOSIS — Z7984 Long term (current) use of oral hypoglycemic drugs: Secondary | ICD-10-CM | POA: Diagnosis not present

## 2018-04-11 DIAGNOSIS — E119 Type 2 diabetes mellitus without complications: Secondary | ICD-10-CM | POA: Diagnosis not present

## 2018-04-11 DIAGNOSIS — H524 Presbyopia: Secondary | ICD-10-CM | POA: Diagnosis not present

## 2018-04-11 DIAGNOSIS — H401131 Primary open-angle glaucoma, bilateral, mild stage: Secondary | ICD-10-CM | POA: Diagnosis not present

## 2018-04-11 DIAGNOSIS — H52203 Unspecified astigmatism, bilateral: Secondary | ICD-10-CM | POA: Diagnosis not present

## 2018-04-11 DIAGNOSIS — Z961 Presence of intraocular lens: Secondary | ICD-10-CM | POA: Diagnosis not present

## 2018-04-11 DIAGNOSIS — H5212 Myopia, left eye: Secondary | ICD-10-CM | POA: Diagnosis not present

## 2018-04-16 ENCOUNTER — Other Ambulatory Visit: Payer: Self-pay | Admitting: Medical

## 2018-04-24 ENCOUNTER — Other Ambulatory Visit (INDEPENDENT_AMBULATORY_CARE_PROVIDER_SITE_OTHER): Payer: Medicare Other

## 2018-04-24 DIAGNOSIS — D649 Anemia, unspecified: Secondary | ICD-10-CM

## 2018-04-24 LAB — FECAL OCCULT BLOOD, IMMUNOCHEMICAL: Fecal Occult Bld: NEGATIVE

## 2018-04-24 NOTE — Addendum Note (Signed)
Addended by: Harl Bowie on: 04/24/2018 04:46 PM   Modules accepted: Orders

## 2018-04-25 ENCOUNTER — Telehealth: Payer: Self-pay

## 2018-04-25 NOTE — Telephone Encounter (Signed)
Called pt to schedule overdue inr pt informed me that they are not being seen by our practice for coumadin

## 2018-04-28 ENCOUNTER — Other Ambulatory Visit (HOSPITAL_COMMUNITY): Payer: Self-pay | Admitting: Cardiology

## 2018-05-03 ENCOUNTER — Other Ambulatory Visit: Payer: Self-pay | Admitting: Medical

## 2018-05-07 ENCOUNTER — Other Ambulatory Visit (HOSPITAL_COMMUNITY): Payer: Self-pay | Admitting: Cardiology

## 2018-05-07 ENCOUNTER — Other Ambulatory Visit: Payer: Self-pay | Admitting: Medical

## 2018-05-13 ENCOUNTER — Other Ambulatory Visit: Payer: Self-pay | Admitting: Medical

## 2018-05-18 ENCOUNTER — Other Ambulatory Visit (HOSPITAL_COMMUNITY): Payer: Self-pay | Admitting: Cardiology

## 2018-05-20 ENCOUNTER — Other Ambulatory Visit: Payer: Self-pay | Admitting: Medical

## 2018-05-20 ENCOUNTER — Other Ambulatory Visit (HOSPITAL_COMMUNITY): Payer: Self-pay | Admitting: Cardiology

## 2018-06-01 ENCOUNTER — Encounter: Payer: Self-pay | Admitting: Medical

## 2018-06-02 ENCOUNTER — Ambulatory Visit: Payer: Self-pay

## 2018-06-02 NOTE — Telephone Encounter (Signed)
Called pt to let her know that overdue pt stated followed at [facility]

## 2018-06-07 ENCOUNTER — Other Ambulatory Visit: Payer: Self-pay | Admitting: Medical

## 2018-06-17 ENCOUNTER — Other Ambulatory Visit (HOSPITAL_COMMUNITY): Payer: Self-pay | Admitting: Cardiology

## 2018-06-17 ENCOUNTER — Other Ambulatory Visit: Payer: Self-pay | Admitting: Medical

## 2018-06-22 ENCOUNTER — Other Ambulatory Visit: Payer: Self-pay | Admitting: Medical

## 2018-06-22 ENCOUNTER — Other Ambulatory Visit (HOSPITAL_COMMUNITY): Payer: Self-pay | Admitting: Cardiology

## 2018-06-28 ENCOUNTER — Other Ambulatory Visit: Payer: Self-pay | Admitting: Medical

## 2018-06-29 ENCOUNTER — Ambulatory Visit (INDEPENDENT_AMBULATORY_CARE_PROVIDER_SITE_OTHER): Payer: Medicare Other | Admitting: Medical

## 2018-06-29 ENCOUNTER — Encounter: Payer: Self-pay | Admitting: Medical

## 2018-06-29 VITALS — BP 132/45 | HR 80 | Temp 98.1°F | Resp 16 | Ht 62.0 in | Wt 190.0 lb

## 2018-06-29 DIAGNOSIS — M25551 Pain in right hip: Secondary | ICD-10-CM | POA: Diagnosis not present

## 2018-06-29 DIAGNOSIS — M5441 Lumbago with sciatica, right side: Secondary | ICD-10-CM | POA: Diagnosis not present

## 2018-06-29 DIAGNOSIS — R42 Dizziness and giddiness: Secondary | ICD-10-CM | POA: Diagnosis not present

## 2018-06-29 LAB — COMPREHENSIVE METABOLIC PANEL
ALT: 16 U/L (ref 0–35)
AST: 24 U/L (ref 0–37)
Albumin: 4.4 g/dL (ref 3.5–5.2)
Alkaline Phosphatase: 85 U/L (ref 39–117)
BUN: 20 mg/dL (ref 6–23)
CO2: 32 mEq/L (ref 19–32)
Calcium: 9.8 mg/dL (ref 8.4–10.5)
Chloride: 100 mEq/L (ref 96–112)
Creatinine, Ser: 1.19 mg/dL (ref 0.40–1.20)
GFR: 44.49 mL/min — ABNORMAL LOW (ref >60.00)
Glucose, Bld: 161 mg/dL — ABNORMAL HIGH (ref 70–99)
Potassium: 4.7 mEq/L (ref 3.5–5.1)
Sodium: 140 mEq/L (ref 135–145)
Total Bilirubin: 1 mg/dL (ref 0.2–1.2)
Total Protein: 7.3 g/dL (ref 6.0–8.3)

## 2018-06-29 LAB — CBC WITH DIFFERENTIAL/PLATELET
Basophils Absolute: 0 10*3/uL (ref 0.0–0.1)
Basophils Relative: 0.7 % (ref 0.0–3.0)
Eosinophils Absolute: 0.1 10*3/uL (ref 0.0–0.7)
Eosinophils Relative: 1.1 % (ref 0.0–5.0)
HCT: 33.1 % — ABNORMAL LOW (ref 36.0–46.0)
Hemoglobin: 11 g/dL — ABNORMAL LOW (ref 12.0–15.0)
Lymphocytes Relative: 29.7 % (ref 12.0–46.0)
Lymphs Abs: 1.5 10*3/uL (ref 0.7–4.0)
MCHC: 33.3 g/dL (ref 30.0–36.0)
MCV: 86.2 fl (ref 78.0–100.0)
Monocytes Absolute: 0.4 10*3/uL (ref 0.1–1.0)
Monocytes Relative: 8.5 % (ref 3.0–12.0)
Neutro Abs: 3.1 10*3/uL (ref 1.4–7.7)
Neutrophils Relative %: 60 % (ref 43.0–77.0)
Platelets: 143 10*3/uL — ABNORMAL LOW (ref 150.0–400.0)
RBC: 3.83 Mil/uL — ABNORMAL LOW (ref 3.87–5.11)
RDW: 16.8 % — ABNORMAL HIGH (ref 11.5–15.5)
WBC: 5.2 10*3/uL (ref 4.0–10.5)

## 2018-06-29 MED ORDER — PREDNISONE 10 MG PO TABS: ORAL_TABLET | ORAL | 0 refills | Status: DC

## 2018-06-29 MED ORDER — MECLIZINE HCL 12.5 MG PO TABS: 12.5000 mg | ORAL_TABLET | Freq: Three times a day (TID) | ORAL | 0 refills | Status: DC | PRN

## 2018-06-29 MED ORDER — KETOROLAC TROMETHAMINE 30 MG/ML IJ SOLN
30.0000 mg | Freq: Once | INTRAMUSCULAR | Status: AC
  Administered 2018-06-29: 30 mg via INTRAMUSCULAR

## 2018-06-29 MED ORDER — HYDROCODONE-ACETAMINOPHEN 5-325 MG PO TABS: 1.0000 | ORAL_TABLET | Freq: Four times a day (QID) | ORAL | 0 refills | Status: DC | PRN

## 2018-06-29 NOTE — Patient Instructions (Signed)
For your lower back pain with sciatica features and right hip pain, we gave you Toradol 30 mg IM injection.  After we get labs back and check your sugar level, will decide this you will use prednisone 6-day taper dose.  Print prescription of prednisone given today.  Also making Norco tablets available.  Printed prescription given.  Use Norco sparingly for severe pain.  Rx advisement given.  For your intermittent dizziness, I did decide to get CBC and metabolic panel.  Your neurologic exam was normal.  He has some features of mild vertigo.  Making meclizine available to use for dizziness/vertigo lasting for more than 5 minutes.  Caution on getting your balance when you change positions/before ambulating.  If you have any motor deficits or neurologic signs symptoms with dizziness and recommend ED evaluation.  For diabetes, continue with metformin.  Eat low sugar diet if we decide to have the start prednisone.  Also want you to call your insurance and ask what is preferred medication that could replace Januvia as that was taken off of formulary recently?Marland Kitchen  In the past Onglyza was not covered.  Let me know what they say and I will prescribe whatever is now covered.  Follow-up in 2 weeks or as needed.

## 2018-06-29 NOTE — Progress Notes (Signed)
Subjective:    Patient ID: Abigail Oliver, female    DOB: 1946/01/25, 73 y.o.   MRN: [id number]  HPI This is a redictation of note that was incomplete/auto saved failure incomplete.  Patient's had right-sided lower back pain with some pain rating to the right hip and right posterior thigh/hamstring area.  No trauma or fall.  No report of any problems with her bowels/bladder.  No incontinence reported.  No weakness or numbness to her lower extremities.  Pain is not midline but just to the right of midline in the sciatic area.  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  She has her own salon.  X-rays of patient's lumbar spine and right hip x-ray did not show any significant findings when those were done in the fall.  Did review her controlled medication profile in the computer and she is no longer taking Xanax.  BuSpar is controlling her anxiety.  She is not on tramadol although she used that briefly in November.  Patient also reports some intermittent mild transient dizziness.  No gross motor or sensory function deficits reported.  Dizziness sometimes occurs on standing up quickly.  Sometimes he has some faint transient vertigo when lying in bed and turning her head to the right and left.  No headache reported today.   Review of Systems  Constitutional: Negative for chills, fatigue and fever.  Respiratory: Negative for cough, chest tightness, shortness of breath and wheezing.   Cardiovascular: Negative for chest pain and palpitations.  Gastrointestinal: Negative for abdominal pain, blood in stool, constipation and diarrhea.  Genitourinary: Negative for dysuria, flank pain and frequency.  Musculoskeletal: Positive for back pain.       See HPI.  Neurological: Positive for dizziness.       See HPI.  Hematological: Negative for adenopathy. Does not bruise/bleed easily.  Psychiatric/Behavioral: Negative for behavioral problems and dysphoric mood. The patient is  nervous/anxious.        Anxiety controlled.  But recent stress reported when she changed salons.   Past Medical History:  Diagnosis Date  . Anemia   . Anxiety   . Aortic stenosis    a. Now has St Jude mechanical aortic valve (6/00).b. Echo (6/15) with EF 60-65%, mechanical aortic valve with mean gradient 27 mmHg.  . Arthritis    a. Possible c-spine arthritis with pain down left arm.   . Carotid artery disease ([place])    a. Carotid dopplers (7/16) with 40-59% BICA stenosis.   . Chronic angle-closure glaucoma(365.23)   . Chronic diastolic CHF (congestive heart failure) ([place])    a.  RHC (7/15) with mean RA 2, PA 22/11, mean PCWP 9, CI 3.79.  Marland Kitchen Colon polyps   . Contrast media allergy   . Coronary atherosclerosis of native coronary artery    a. CABG at time of AVR in 6/00 with RIMA-RCA.b. Abnl nuc 11/2013 -> LHC (7/15) with patent RIMA-RCA, 80% mRCA, 80% pLAD with FFR 0.73, treated with DES to pLAD.  Marland Kitchen Depression   . Dizziness 02/09/2012   a. Holter (8/14) with few PACs, otherwise unremarkable.   . Elevated transaminase level   . Essential hypertension   . Gastritis   . GERD (gastroesophageal reflux disease)   . Hyperlipidemia   . Low back pain   . Mechanical heart valve present   . Neuromuscular disorder (H[facility])    neuropathy  . Peripheral vascular disease ([place])    a, H/o Right SFA stent. b. Peripheral arterial  dopplers (7/16) with right SFA stent patent.   Marland Kitchen PONV (postoperative nausea and vomiting)    N&V  . Pulmonary nodules    a. Noted by CT in 6/15. CT chest (8/15) was thought to indicate that nodules were benign. No further workup was recommended.   . Syncope 03/29/2012   carotid doppler - R ICA 50-69% reduction by velocities (low end); L ICA 0-49% reduction by velocities (low end); R and L subclavian arteries - <50% redcution; R and L vertebral arteries show normal antegrade flow  . Type II diabetes mellitus ([place])      Social History   Socioeconomic History  . Marital  status: Married    Spouse name: [name]  . Number of children: 2  . Years of education: 2  . Highest education level: Not on file  Occupational History    Comment: hair dresser  Social Needs  . Financial resource strain: Not on file  . Food insecurity:    Worry: Not on file    Inability: Not on file  . Transportation needs:    Medical: Not on file    Non-medical: Not on file  Tobacco Use  . Smoking status: Never Smoker  . Smokeless tobacco: Never Used  Substance and Sexual Activity  . Alcohol use: No  . Drug use: No  . Sexual activity: Never  Lifestyle  . Physical activity:    Days per week: Not on file    Minutes per session: Not on file  . Stress: Not on file  Relationships  . Social connections:    Talks on phone: Not on file    Gets together: Not on file    Attends religious service: Not on file    Active member of club or organization: Not on file    Attends meetings of clubs or organizations: Not on file    Relationship status: Not on file  . Intimate partner violence:    Fear of current or ex partner: Not on file    Emotionally abused: Not on file    Physically abused: Not on file    Forced sexual activity: Not on file  Other Topics Concern  . Not on file  Social History Narrative   Pt is a high school graduate with 2 years of college. Married in 1967 she has 1 son born 75 and 1 daughter born 69 and 1 grandchild. 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      Caffeine use- 2-3 /day, soda, tea     Past Surgical History:  Procedure Laterality Date  . BILATERAL OOPHORECTOMY  1987  . BREAST BIOPSY  2012   left  . CARDIAC CATHETERIZATION  01/14/1999   normal LV function, severe aortic stenosis; 80% and 70% stenosis in RCA; mild 20% distal norrowing in L main with 20% proximal LAD stenosis, 40% diagonal stenosis and 20% proximal circumflex stenosis  . CARDIAC VALVE REPLACEMENT  2000   aortic valve   . CARDIOVASCULAR STRESS TEST   08/25/2011   R/L MV - EF 72%; no scintigraphic evidence of inducible MI; normal perfusionTID of 1.25 elevated - could indicate small vessle subendocardial ischemia; EKG NSR at 66, non diagnostic for ischemia  . [place]   bilaterally  . CATARACT EXTRACTION W/ INTRAOCULAR LENS  IMPLANT, BILATERAL  ~ 2007  . [place]; 1971  . CHOLECYSTECTOMY  1990  . COLONOSCOPY N/A 10/27/2012   Procedure: COLONOSCOPY;  Surgeon:  Beryle Beams, MD;  Location: [facility];  Service: Endoscopy;  Laterality: N/A;  . CORONARY ARTERY BYPASS GRAFT  2000   RIMA to RCA  . DOPPLER ECHOCARDIOGRAPHY  03/29/2012   EF >55%; mild concentric LVH; stage 1 diastolic dysfunction, elevated LV filling pressure, dilated LA; MAC mild MR; St Jude AVR peak and mean gradients of 56mHg and 220mg; transvalvular gradients have increased (prev 23 and 14 respectively)  . ESOPHAGOGASTRODUODENOSCOPY N/A 10/27/2012   Procedure: ESOPHAGOGASTRODUODENOSCOPY (EGD);  Surgeon: PaBeryle BeamsMD;  [facility];  Service: Endoscopy;  Laterality: N/A;  . FEMORAL ARTERY STENT  09/20/2011   6 x 40 Smart Nitinol self-expanding stent placed;  10/15/2031 -R SFA stent open and patent w/o evidence of restenosis  . FRACTIONAL FLOW RESERVE WIRE  12/14/2013   Procedure: FRACTIONAL FLOW RESERVE WIRE;  Surgeon: DaLarey DresserMD;  [facility];  Service: Cardiovascular;;  . LACERATION REPAIR     right hand  . LEFT AND RIGHT HEART CATHETERIZATION WITH CORONARY ANGIOGRAM N/A 12/14/2013   Procedure: LEFT AND RIGHT HEART CATHETERIZATION WITH CORONARY ANGIOGRAM;  Surgeon: DaLarey DresserMD;  [facility];  Service: Cardiovascular;  Laterality: N/A;  . LOWER EXTREMITY ANGIOGRAM N/A 09/20/2011   Procedure: LOWER EXTREMITY ANGIOGRAM;  Surgeon: JoLorretta HarpMD;  [facility];  Service: Cardiovascular;  Laterality: N/A;  . LYMPH NODE BIOPSY  06/2011   "core needle on 5"  . needle biopsy  2009   "on  ankles for nerve damage"  . NEUROPLASTY / TRANSPOSITION ULNAR NERVE AT ELBOW     right  . PERCUTANEOUS CORONARY STENT INTERVENTION (PCI-S)  12/14/2013   Procedure: PERCUTANEOUS CORONARY STENT INTERVENTION (PCI-S);  Surgeon: DaLarey DresserMD;  [facility];  Service: Cardiovascular;;  Prox LAD 3.00x12 Promus DES   . PERIPHERAL ARTERIAL STENT GRAFT  09/20/11   right SFA  . REFRACTIVE SURGERY  ` 2004   "for glaucoma"  . TONSILLECTOMY  1968  . TRIGGER FINGER RELEASE Left 12/04/2015   Procedure: LEFT RING FINGER TRIGGER RELEASE;  Surgeon: KeLeanora CoverMD;  [facility];  Laterality: Left;  . Marland KitchenAGINAL HYSTERECTOMY  1985   Fibroids    Family History  Problem Relation Age of Onset  . Colon cancer Mother   . COPD Mother   . Emphysema Mother   . Cancer Maternal Grandmother   . Cancer Maternal Grandfather   . Heart disease Brother   . Diabetes Neg Hx     Allergies  Allergen Reactions  . Atorvastatin     Muscle pain  . Simvastatin Other (See Comments)    Muscle pain  . Sulfa Antibiotics     unknown  . Iodinated Diagnostic Agents Rash     Red rash after cardiac cath 1 wk ago, ? Contrast allergy, requires 13 hr prep now per [email or web address]    Current Outpatient Medications on File Prior to Visit  Medication Sig Dispense Refill  . Ascorbic Acid (VITAMIN C) 500 MG tablet Take 500 mg by mouth 2 (two) times daily.     . Marland Kitchenspirin EC 81 MG tablet Take 1 tablet (81 mg total) by mouth daily.    . busPIRone (BUSPAR) 7.5 MG tablet TAKE ONE TABLET BY MOUTH THREE TIMES A DAY 45 tablet 0  . calcium-vitamin D (CALCIUM 500+D) 500-200 MG-UNIT per tablet Take 1 tablet by mouth 2 (two) times daily.     . ferrous sulfate  325 (65 FE) MG tablet Take 1 tablet (325 mg total) by mouth daily. 30 tablet 0  . folic acid (FOLVITE) 1 MG tablet Take 1 mg by mouth daily.    Marland Kitchen latanoprost (XALATAN) 0.005 % ophthalmic solution PLACE 1 DROP INTO BOTH EYES  AT BEDTIME. 2.5 mL 2  . meclizine (ANTIVERT) 12.5 MG tablet Take 1 tablet (12.5 mg total) by mouth 3 (three) times daily as needed for dizziness. 21 tablet 0  . metoprolol succinate (TOPROL-XL) 50 MG 24 hr tablet TAKE ONE TABLET BY MOUTH DAILY 90 tablet 0  . nitroGLYCERIN (NITROSTAT) 0.4 MG SL tablet Place 1 tablet (0.4 mg total) under the tongue every 5 (five) minutes as needed for chest pain. 25 tablet 3  . nystatin cream (MYCOSTATIN) Apply 1 application topically 2 (two) times daily. 30 g 1  . pantoprazole (PROTONIX) 40 MG tablet Take 1 tablet (40 mg total) by mouth daily. 30 tablet 11  . Potassium Chloride ER 20 MEQ TBCR TAKE THREE TABLETS BY MOUTH EVERY MORNING AND TAKE TWO TABLETS BY MOUTH EVERY EVENING 450 tablet 2  . rosuvastatin (CRESTOR) 20 MG tablet Take 1 tablet (20 mg total) by mouth daily. 90 tablet 3  . sitaGLIPtin (JANUVIA) 25 MG tablet Take 1 tablet (25 mg total) by mouth daily. 30 tablet 3  . torsemide (DEMADEX) 20 MG tablet TAKE THREE TABLETS BY MOUTH EVERY MORNING TAKE TWO TABLETS BY MOUTH EVERY EVENING 450 tablet 0  . traZODone (DESYREL) 50 MG tablet TAKE 1/2 TO 1 TABLET BY MOUTH EVERY NIGHT AT BEDTIME AS NEEDED  FOR SLEEP 21 tablet 0  . venlafaxine XR (EFFEXOR-XR) 37.5 MG 24 hr capsule TAKE ONE CAPSULE BY MOUTH EVERY MORNING WITH BREAKFAST 30 capsule 0  . warfarin (COUMADIN) 3 MG tablet TAKE 1/2 TO 1 TABLET BY MOUTH DAILY AS DIRECTED 30 tablet 0   No current facility-administered medications on file prior to visit.     BP (!) 132/45 (BP Location: Right Arm, Patient Position: Sitting, Cuff Size: Normal)   Pulse 80   Temp 98.1 F (36.7 C)   Resp 16   Ht 5' 2"  (1.575 m)   Wt 190 lb (86.2 kg)   SpO2 100%   BMI 34.75 kg/m       Objective:   Physical Exam  General Appearance- Not in acute distress.    Chest and Lung Exam Auscultation: Breath sounds:-Normal. Clear even and unlabored. Adventitious sounds:- No Adventitious  sounds.  Cardiovascular Auscultation:Rythm - Regular, rate and rythm. Heart Sounds -Normal heart sounds.  Abdomen Inspection:-Inspection Normal.  Palpation/Perucssion: Palpation and Percussion of the abdomen reveal- Non Tender, No Rebound tenderness, No rigidity(Guarding) and No Palpable abdominal masses.  Liver:-Normal.  Spleen:- Normal.   Back No mid lumbar spine tenderness to palpation. Pain on straight leg lift. Pain on lateral movements and flexion/extension of the spine. Right SI tenderness to palpation directly.  Right hip- tenderness to palpation and pain on range of motion.  Lower ext neurologic  L5-S1 sensation intact bilaterally. Normal patellar reflexes bilaterally. No foot drop bilaterally.     Neurologic Cranial Nerve exam:- CN III-XII intact(No nystagmus), symmetric smile. Drift Test:- No drift. Romberg Exam:- Negative.  Heal to Toe Gait exam: Poor heel toe but she does have poor baseline balance and back and hip pain presently. Finger to Nose:- Normal/Intact Strength:- 5/5 equal and symmetric strength both upper and lower extremities.      Assessment & Plan:  For your lower back pain with sciatica features and  right hip pain, we gave you Toradol 30 mg IM injection.  After we get labs back and check your sugar level, will decide this you will use prednisone 6-day taper dose.  Print prescription of prednisone given today.  Also making Norco tablets available.  Printed prescription given.  Use Norco sparingly for severe pain.  Rx advisement given.  For your intermittent dizziness, I did decide to get CBC and metabolic panel.  Your neurologic exam was normal.  He has some features of mild vertigo.  Making meclizine available to use for dizziness/vertigo lasting for more than 5 minutes.  Caution on getting your balance when you change positions/before ambulating.  If you have any motor deficits or neurologic signs symptoms with dizziness and recommend ED  evaluation.  For diabetes, continue with metformin.  Eat low sugar diet if we decide to have the start prednisone.  Also want you to call your insurance and ask what is preferred medication that could replace Januvia as that was taken off of formulary recently?Marland Kitchen  In the past Onglyza was not covered.  Let me know what they say and I will prescribe whatever is now covered.  Follow-up in 2 weeks or as needed.  40 minutes spent with patient today.  50% of time spent counseling patient on treatment plan for her back pain, hip pain, dizziness(work-up required), and treatment going forward for her diabetes in light of insurance restrictions.  [name], PA-C

## 2018-06-30 ENCOUNTER — Telehealth: Payer: Self-pay | Admitting: Medical

## 2018-06-30 DIAGNOSIS — N1832 Chronic kidney disease, stage 3b: Secondary | ICD-10-CM

## 2018-06-30 DIAGNOSIS — N183 Chronic kidney disease, stage 3 (moderate): Principal | ICD-10-CM

## 2018-06-30 NOTE — Telephone Encounter (Signed)
Referral to nephrologist placed. 

## 2018-07-13 ENCOUNTER — Other Ambulatory Visit: Payer: Self-pay | Admitting: Medical

## 2018-07-25 ENCOUNTER — Other Ambulatory Visit (HOSPITAL_COMMUNITY): Payer: Self-pay | Admitting: Cardiology

## 2018-07-30 ENCOUNTER — Other Ambulatory Visit: Payer: Self-pay | Admitting: Medical

## 2018-07-30 ENCOUNTER — Other Ambulatory Visit (HOSPITAL_COMMUNITY): Payer: Self-pay | Admitting: Cardiology

## 2018-07-31 LAB — POCT INR: INR: 3.9 — AB (ref 2.0–3.0)

## 2018-08-04 ENCOUNTER — Telehealth: Payer: Self-pay

## 2018-08-04 NOTE — Telephone Encounter (Signed)
Copied from [place] [id number]. Topic: Referral - Request for Referral >> Aug 03, 2018 11:43 AM Rayann Heman wrote: Has patient seen PCP for this complaint? Yes  Referral for which specialty: Ortho Preferred provider/office: any Reason for referral: hip pain

## 2018-08-05 ENCOUNTER — Telehealth: Payer: Self-pay | Admitting: Medical

## 2018-08-05 DIAGNOSIS — M25551 Pain in right hip: Secondary | ICD-10-CM

## 2018-08-05 NOTE — Telephone Encounter (Signed)
Refer to sport med placed.

## 2018-08-05 NOTE — Telephone Encounter (Signed)
Notify pt that placed referral to sports med since hip xray was negative. So nonsurgical/sport medicine referral should be adequate and quicker.

## 2018-08-12 ENCOUNTER — Other Ambulatory Visit: Payer: Self-pay | Admitting: Medical

## 2018-08-29 ENCOUNTER — Other Ambulatory Visit (HOSPITAL_COMMUNITY): Payer: Self-pay | Admitting: Cardiology

## 2018-08-29 ENCOUNTER — Other Ambulatory Visit: Payer: Self-pay | Admitting: Medical

## 2018-08-29 NOTE — Telephone Encounter (Signed)
Rx trazadone refilled.

## 2018-09-02 ENCOUNTER — Other Ambulatory Visit: Payer: Self-pay | Admitting: Medical

## 2018-09-02 ENCOUNTER — Other Ambulatory Visit: Payer: Self-pay | Admitting: Cardiovascular Disease

## 2018-09-04 ENCOUNTER — Telehealth: Payer: Self-pay

## 2018-09-04 NOTE — Telephone Encounter (Signed)
lmom for prescreen/drive thru 

## 2018-09-05 ENCOUNTER — Ambulatory Visit (INDEPENDENT_AMBULATORY_CARE_PROVIDER_SITE_OTHER): Payer: Medicare Other

## 2018-09-05 ENCOUNTER — Other Ambulatory Visit: Payer: Self-pay | Admitting: Medical

## 2018-09-05 ENCOUNTER — Telehealth: Payer: Self-pay | Admitting: Medical

## 2018-09-05 ENCOUNTER — Other Ambulatory Visit: Payer: Self-pay

## 2018-09-05 DIAGNOSIS — Z7901 Long term (current) use of anticoagulants: Secondary | ICD-10-CM | POA: Diagnosis not present

## 2018-09-05 DIAGNOSIS — Z952 Presence of prosthetic heart valve: Secondary | ICD-10-CM

## 2018-09-05 LAB — POCT INR: INR: 2.6 (ref 2.0–3.0)

## 2018-09-05 NOTE — Telephone Encounter (Signed)
I did not fill pt gabapentin. I looked at last note and did not see that I prescribed that. I think she was on that a while ago. Was this automatic refill or did patient as specifically for this. Will you call her and see how she is doing. I think she recently got tested for covid as well per chart review.

## 2018-09-06 NOTE — Telephone Encounter (Signed)
Left pt a message to call back. Okay for [facility] to give information.  

## 2018-09-07 ENCOUNTER — Ambulatory Visit (INDEPENDENT_AMBULATORY_CARE_PROVIDER_SITE_OTHER): Payer: Medicare Other | Admitting: Medical

## 2018-09-07 ENCOUNTER — Other Ambulatory Visit: Payer: Self-pay

## 2018-09-07 ENCOUNTER — Encounter: Payer: Self-pay | Admitting: Medical

## 2018-09-07 DIAGNOSIS — G629 Polyneuropathy, unspecified: Secondary | ICD-10-CM | POA: Diagnosis not present

## 2018-09-07 MED ORDER — GABAPENTIN 300 MG PO CAPS: 300.0000 mg | ORAL_CAPSULE | Freq: Three times a day (TID) | ORAL | 3 refills | Status: DC

## 2018-09-07 NOTE — Progress Notes (Signed)
   Subjective:    Patient ID: Abigail Oliver, female    DOB: 07/18/1945, 73 y.o.   MRN: [id number]  HPI  Virtual Visit via Video Note  I connected with [name] on 09/07/18 at 11:00 AM EDT by a video enabled telemedicine application and verified that I am speaking with the correct person using two identifiers.   I discussed the limitations of evaluation and management by telemedicine and the availability of in person appointments. The patient expressed understanding and agreed to proceed.   History of Present Illness: Pt has hx of neuropathy in the past. Pt states in past she used to be on past but she stopped this in the past. She wants to restart. She states her neuropathy is worse than it was in past. She states her feet are tingling and numb. She states can't sleep.   Pt also has some insomnia. We discussed that gabapentin can help with insomnia.   Observations/Objective: No acute distress.  Assessment and Plan: Patient does have history of neuropathy and was formally on gabapentin.  She got off the medication and now is requesting refills.  I went ahead and send gabapentin 300 mg tablets to her pharmacy and she will use that 3 times daily.  I did explain to her that this might also help her sleep.  I asked her to give me an update next week as to whether or not this is helping.  We had discussion on potentially increasing the dose but if further increase in dose might touch base with the pharmacist as to dosing increase in light of her decreased GFR.  Follow-up date to be determined.  Follow Up Instructions:    I discussed the assessment and treatment plan with the patient. The patient was provided an opportunity to ask questions and all were answered. The patient agreed with the plan and demonstrated an understanding of the instructions.   The patient was advised to call back or seek an in-person evaluation if the symptoms worsen or if the condition fails to improve  as anticipated.  I provided 15 minutes of non-face-to-face time during this encounter.   [name], PA-C    Review of Systems  Musculoskeletal: Negative for back pain.  Neurological:       Neuropathy.  Describes decreased sensation of both feet with tingling sensation.       Objective:   Physical Exam  See objective.      Assessment & Plan:

## 2018-09-07 NOTE — Patient Instructions (Signed)
Patient does have history of neuropathy and was formally on gabapentin.  She got off the medication and now is requesting refills.  I went ahead and send gabapentin 300 mg tablets to her pharmacy and she will use that 3 times daily.  I did explain to her that this might also help her sleep.  I asked her to give me an update next week as to whether or not this is helping.  We had discussion on potentially increasing the dose but if further increase in dose might touch base with the pharmacist as to dosing increase in light of her decreased GFR.  Follow-up date to be determined.

## 2018-09-07 NOTE — Telephone Encounter (Signed)
Pt scheduled today for Virtual visit

## 2018-09-07 NOTE — Telephone Encounter (Signed)
Patient called and was given the information below from [facility], PA-C noted on 09/05/18. Patient says she was on gabapentin for neuropathy, came off 2 months ago because she didn't feel it was really helping the neuropathy. She says lately the neuropathy got worse, feet are numb and don't have the good balance. She says she asked the pharmacist and was told they would send a request to [facility] to refill. She says she would like to start back on it and doesn't remember who filled it for her, maybe her diabetes doctor she says. About COVID, she says she would like to get tested before she goes back to work, to make sure everyone is safe. She denies any symptoms.

## 2018-09-12 DIAGNOSIS — E119 Type 2 diabetes mellitus without complications: Secondary | ICD-10-CM | POA: Diagnosis not present

## 2018-09-12 DIAGNOSIS — Z5181 Encounter for therapeutic drug level monitoring: Secondary | ICD-10-CM | POA: Diagnosis not present

## 2018-09-12 DIAGNOSIS — R296 Repeated falls: Secondary | ICD-10-CM | POA: Diagnosis not present

## 2018-09-16 ENCOUNTER — Other Ambulatory Visit: Payer: Self-pay | Admitting: Medical

## 2018-09-18 ENCOUNTER — Other Ambulatory Visit: Payer: Self-pay

## 2018-09-18 ENCOUNTER — Ambulatory Visit: Payer: Self-pay

## 2018-09-18 ENCOUNTER — Telehealth: Payer: Self-pay

## 2018-09-18 ENCOUNTER — Ambulatory Visit (INDEPENDENT_AMBULATORY_CARE_PROVIDER_SITE_OTHER): Payer: Medicare Other | Admitting: Medical

## 2018-09-18 ENCOUNTER — Encounter: Payer: Self-pay | Admitting: Medical

## 2018-09-18 DIAGNOSIS — K0889 Other specified disorders of teeth and supporting structures: Secondary | ICD-10-CM

## 2018-09-18 DIAGNOSIS — K047 Periapical abscess without sinus: Secondary | ICD-10-CM

## 2018-09-18 DIAGNOSIS — R509 Fever, unspecified: Secondary | ICD-10-CM | POA: Diagnosis not present

## 2018-09-18 DIAGNOSIS — M272 Inflammatory conditions of jaws: Secondary | ICD-10-CM | POA: Diagnosis not present

## 2018-09-18 MED ORDER — AMOXICILLIN-POT CLAVULANATE 875-125 MG PO TABS: 1.0000 | ORAL_TABLET | Freq: Two times a day (BID) | ORAL | 0 refills | Status: DC

## 2018-09-18 NOTE — Patient Instructions (Signed)
You do appear to have probable tooth infection and appears have responded to limited 3 days of augmentin(date expired). Will give new rx of augmentin for 10 days. Gave number to dental works as appears you will likely need extraction later in week of early next week.  Give me update if not able to schedule with dentist or if condition worsens.

## 2018-09-18 NOTE — Progress Notes (Signed)
   Subjective:    Patient ID: Abigail Oliver, female    DOB: [birth year], 73 y.o.   MRN: [id number]  HPI  Virtual Visit via Video Note   I connected with [name] on 09/18/18 at 10:40 AM EDT by a video enabled telemedicine application and verified that I am speaking with the correct person using two identifiers.   I discussed the limitations of evaluation and management by telemedicine and the availability of in person appointments. The patient expressed understanding and agreed to proceed.  No vitals or temp checked today.  Pt was at home and I was in office.  History of Present Illness:  Pt in states she had crown that broke recently months ago. Just past Thursday rt lower jaw area got puffy, swollen and tender. The area is progressively getter worse. She got fever 100 on Friday on night. Pt she mouthwashes this weekend to see if food debri stuck between tooth and gum.  Pt did find old prescription of amoxicillin-clavulanic.took Saturday twice a day. But old antibiotic 72 month old. She states swelling and tenderness decreased.  Pt called clinic handling coumadin. Since she might have only one tooth pulled no need to stop antibiotic.    Observations/Objective: No acute distress. On inspection. Rt lower side of jaw looks mild inflammed. No neck stiffness. Neck does not appear swollen.  Assessment and Plan: You do appear to have probable tooth infection and appears have responded to limited 3 days of augmentin(date expired). Will give new rx of augmentin for 10 days. Gave number to dental works as appears you will likely need extraction later in week of early next week.  Give me update if not able to schedule with dentist or if condition worsens.  Follow Up Instructions:    I discussed the assessment and treatment plan with the patient. The patient was provided an opportunity to ask questions and all were answered. The patient agreed with the plan and demonstrated an  understanding of the instructions.   The patient was advised to call back or seek an in-person evaluation if the symptoms worsen or if the condition fails to improve as anticipated.     [name], PA-C   Review of Systems  Constitutional: Positive for fever. Negative for diaphoresis and fatigue.  HENT:       Tooth pain. See hpi.  Respiratory: Negative for cough, chest tightness, shortness of breath and wheezing.   Cardiovascular: Negative for chest pain and palpitations.  Musculoskeletal: Negative for arthralgias, back pain, neck pain and neck stiffness.  Skin: Negative for rash.       Objective:   Physical Exam   See objective.       Assessment & Plan:

## 2018-09-18 NOTE — Telephone Encounter (Signed)
Pt called stating that since Thursday night she has had gum/tooth pain.  She states that her crown broke and left a small amount of tooth exposed. She has not established with a dentist.  She started to have symptoms of infection.  Swelling to her gum and jaw lower right side. She states she had fever over the weekend and took some old amoxicillin. Twice a day.  She states she has no fever today.  She has no taken over the counter pain medications. She is on coumadin and is diabetic. She rates the pain at 7. She states she sometimes feels the pain in her ear.  She has applied heat. Care advice read to patient. Pt verbalized understanding.  Call transferred to office for appointment.  Reason for Disposition . Face is very swollen  Answer Assessment - Initial Assessment Questions 1. LOCATION: "Which tooth is hurting?"  (e.g., right-side/left-side, upper/lower, front/back)     Lower right side 2. ONSET: "When did the toothache start?"  (e.g., hours, days)      Tooth been crowned, crown broken 3. SEVERITY: "How bad is the toothache?"  (Scale 1-10; mild, moderate or severe)   - MILD (1-3): doesn't interfere with chewing    - MODERATE (4-7): interferes with chewing, interferes with normal activities, awakens from sleep     - SEVERE (8-10): unable to eat, unable to do any normal activities, excruciating pain       7 4. SWELLING: "Is there any visible swelling of your face?"     Jaw and gum 5. OTHER SYMPTOMS: "Do you have any other symptoms?" (e.g., fever)     Fever 100.1 this weekend no fever now 6. PREGNANCY: "Is there any chance you are pregnant?" "When was your last menstrual period?"     N/A  Protocols used: TOOTHACHE-A-AH

## 2018-09-18 NOTE — Telephone Encounter (Signed)
lmom for prescreen  

## 2018-09-19 ENCOUNTER — Ambulatory Visit (INDEPENDENT_AMBULATORY_CARE_PROVIDER_SITE_OTHER): Payer: Medicare Other | Admitting: Pharmacist Clinician (PhC)/ Clinical Pharmacy Specialist

## 2018-09-19 ENCOUNTER — Other Ambulatory Visit: Payer: Self-pay

## 2018-09-19 DIAGNOSIS — Z7901 Long term (current) use of anticoagulants: Secondary | ICD-10-CM

## 2018-09-19 DIAGNOSIS — Z952 Presence of prosthetic heart valve: Secondary | ICD-10-CM

## 2018-09-19 LAB — POCT INR: INR: 4.5 — AB (ref 2.0–3.0)

## 2018-09-26 ENCOUNTER — Other Ambulatory Visit (HOSPITAL_COMMUNITY): Payer: Self-pay | Admitting: Cardiology

## 2018-09-30 ENCOUNTER — Other Ambulatory Visit: Payer: Self-pay | Admitting: Medical

## 2018-10-02 ENCOUNTER — Telehealth: Payer: Self-pay

## 2018-10-02 NOTE — Telephone Encounter (Signed)

## 2018-10-03 ENCOUNTER — Other Ambulatory Visit: Payer: Self-pay

## 2018-10-03 ENCOUNTER — Ambulatory Visit (INDEPENDENT_AMBULATORY_CARE_PROVIDER_SITE_OTHER): Payer: Medicare Other

## 2018-10-03 ENCOUNTER — Other Ambulatory Visit: Payer: Self-pay | Admitting: Cardiovascular Disease

## 2018-10-03 DIAGNOSIS — Z952 Presence of prosthetic heart valve: Secondary | ICD-10-CM | POA: Diagnosis not present

## 2018-10-03 DIAGNOSIS — Z7901 Long term (current) use of anticoagulants: Secondary | ICD-10-CM | POA: Diagnosis not present

## 2018-10-03 LAB — POCT INR: INR: 3.9 — AB (ref 2.0–3.0)

## 2018-10-03 NOTE — Patient Instructions (Signed)
Description   Called spoke with pt, advised to skip today's dosage of Warfarin, then start taking 1 tablet daily except 1/2 tablet on Sundays, Wednesdays and Fridays.  Recheck in 2 weeks.  Coumadin clinic [phone]

## 2018-10-10 DIAGNOSIS — H04123 Dry eye syndrome of bilateral lacrimal glands: Secondary | ICD-10-CM | POA: Diagnosis not present

## 2018-10-10 DIAGNOSIS — H401131 Primary open-angle glaucoma, bilateral, mild stage: Secondary | ICD-10-CM | POA: Diagnosis not present

## 2018-10-13 ENCOUNTER — Telehealth: Payer: Self-pay

## 2018-10-13 ENCOUNTER — Telehealth: Payer: Self-pay | Admitting: *Deleted

## 2018-10-13 NOTE — Telephone Encounter (Signed)
lmom for prescreen  

## 2018-10-13 NOTE — Telephone Encounter (Signed)

## 2018-10-17 ENCOUNTER — Other Ambulatory Visit: Payer: Self-pay

## 2018-10-17 ENCOUNTER — Other Ambulatory Visit: Payer: Self-pay | Admitting: Medical

## 2018-10-17 ENCOUNTER — Other Ambulatory Visit (HOSPITAL_COMMUNITY): Payer: Self-pay | Admitting: Cardiology

## 2018-10-17 ENCOUNTER — Ambulatory Visit (INDEPENDENT_AMBULATORY_CARE_PROVIDER_SITE_OTHER): Payer: Medicare Other | Admitting: Pharmacist

## 2018-10-17 DIAGNOSIS — Z952 Presence of prosthetic heart valve: Secondary | ICD-10-CM | POA: Diagnosis not present

## 2018-10-17 DIAGNOSIS — Z7901 Long term (current) use of anticoagulants: Secondary | ICD-10-CM | POA: Diagnosis not present

## 2018-10-17 LAB — POCT INR: INR: 4.2 — AB (ref 2.0–3.0)

## 2018-10-21 ENCOUNTER — Other Ambulatory Visit: Payer: Self-pay | Admitting: Medical

## 2018-10-23 ENCOUNTER — Other Ambulatory Visit (HOSPITAL_COMMUNITY): Payer: Self-pay | Admitting: Cardiology

## 2018-10-25 ENCOUNTER — Telehealth: Payer: Self-pay

## 2018-10-25 NOTE — Telephone Encounter (Signed)
lmom for prescreen aware of in office visit

## 2018-10-26 ENCOUNTER — Other Ambulatory Visit: Payer: Self-pay | Admitting: Medical

## 2018-10-26 ENCOUNTER — Encounter (HOSPITAL_COMMUNITY): Payer: Self-pay

## 2018-10-26 ENCOUNTER — Telehealth (HOSPITAL_COMMUNITY): Payer: Self-pay

## 2018-10-26 DIAGNOSIS — I5032 Chronic diastolic (congestive) heart failure: Secondary | ICD-10-CM

## 2018-10-26 NOTE — Telephone Encounter (Signed)
Would be ok for her to stay out of work for another month to allow coronavirus to settle down (avoid risk with heart disease).  However, she needs followup with me (have not seen since 2018) and needs repeat echo to follow the gradient of her mechanical valve.

## 2018-10-26 NOTE — Telephone Encounter (Signed)
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  Pt asking if she can get letter extending time out of her job.  When can she return?

## 2018-10-26 NOTE — Telephone Encounter (Signed)
Letter written and will be mailed to patient. Pt appreciative. Pt also made aware she needs f/u appt.  Ordered for echo, she will get a call to schedule f/u. Pt amendable.

## 2018-10-31 ENCOUNTER — Other Ambulatory Visit: Payer: Self-pay

## 2018-10-31 ENCOUNTER — Ambulatory Visit (INDEPENDENT_AMBULATORY_CARE_PROVIDER_SITE_OTHER): Payer: Medicare Other | Admitting: Pharmacist Clinician (PhC)/ Clinical Pharmacy Specialist

## 2018-10-31 DIAGNOSIS — Z7901 Long term (current) use of anticoagulants: Secondary | ICD-10-CM

## 2018-10-31 DIAGNOSIS — Z952 Presence of prosthetic heart valve: Secondary | ICD-10-CM | POA: Diagnosis not present

## 2018-10-31 LAB — POCT INR: INR: 3 (ref 2.0–3.0)

## 2018-11-06 ENCOUNTER — Other Ambulatory Visit: Payer: Self-pay | Admitting: Medical

## 2018-11-21 ENCOUNTER — Other Ambulatory Visit: Payer: Self-pay | Admitting: Cardiovascular Disease

## 2018-11-22 ENCOUNTER — Telehealth: Payer: Self-pay

## 2018-11-22 NOTE — Telephone Encounter (Signed)
lmom for prescreen  

## 2018-11-29 ENCOUNTER — Ambulatory Visit (INDEPENDENT_AMBULATORY_CARE_PROVIDER_SITE_OTHER): Payer: Medicare Other | Admitting: Pharmacist

## 2018-11-29 ENCOUNTER — Other Ambulatory Visit: Payer: Self-pay

## 2018-11-29 DIAGNOSIS — Z952 Presence of prosthetic heart valve: Secondary | ICD-10-CM

## 2018-11-29 DIAGNOSIS — Z7901 Long term (current) use of anticoagulants: Secondary | ICD-10-CM

## 2018-11-29 LAB — POCT INR: INR: 3.6 — AB (ref 2.0–3.0)

## 2018-12-21 ENCOUNTER — Other Ambulatory Visit: Payer: Self-pay | Admitting: Medical

## 2019-01-08 ENCOUNTER — Ambulatory Visit (INDEPENDENT_AMBULATORY_CARE_PROVIDER_SITE_OTHER): Payer: Medicare Other | Admitting: *Deleted

## 2019-01-08 ENCOUNTER — Other Ambulatory Visit: Payer: Self-pay

## 2019-01-08 DIAGNOSIS — Z952 Presence of prosthetic heart valve: Secondary | ICD-10-CM

## 2019-01-08 DIAGNOSIS — Z7901 Long term (current) use of anticoagulants: Secondary | ICD-10-CM | POA: Diagnosis not present

## 2019-01-08 LAB — POCT INR: INR: 3.3 — AB (ref 2.0–3.0)

## 2019-01-08 NOTE — Patient Instructions (Signed)
Description   Continue taking  1 tablet daily except 1/2 tablet on Mondays, Wednesdays, and Fridays. Repeat INR in 4 weeks

## 2019-01-10 ENCOUNTER — Ambulatory Visit: Payer: Self-pay | Admitting: *Deleted

## 2019-01-10 ENCOUNTER — Emergency Department (HOSPITAL_BASED_OUTPATIENT_CLINIC_OR_DEPARTMENT_OTHER): Payer: Medicare Other

## 2019-01-10 ENCOUNTER — Encounter (HOSPITAL_BASED_OUTPATIENT_CLINIC_OR_DEPARTMENT_OTHER): Payer: Self-pay | Admitting: Emergency Medicine

## 2019-01-10 ENCOUNTER — Emergency Department (HOSPITAL_BASED_OUTPATIENT_CLINIC_OR_DEPARTMENT_OTHER)
Admission: EM | Admit: 2019-01-10 | Discharge: 2019-01-10 | Disposition: A | Discharge status: Home / Self Care | Payer: Medicare Other | Attending: Emergency Medicine | Admitting: Emergency Medicine

## 2019-01-10 ENCOUNTER — Other Ambulatory Visit: Payer: Self-pay

## 2019-01-10 DIAGNOSIS — Z7984 Long term (current) use of oral hypoglycemic drugs: Secondary | ICD-10-CM | POA: Diagnosis not present

## 2019-01-10 DIAGNOSIS — R0609 Other forms of dyspnea: Secondary | ICD-10-CM

## 2019-01-10 DIAGNOSIS — R0600 Dyspnea, unspecified: Secondary | ICD-10-CM

## 2019-01-10 DIAGNOSIS — I259 Chronic ischemic heart disease, unspecified: Secondary | ICD-10-CM | POA: Insufficient documentation

## 2019-01-10 DIAGNOSIS — Z7901 Long term (current) use of anticoagulants: Secondary | ICD-10-CM | POA: Diagnosis not present

## 2019-01-10 DIAGNOSIS — Z20828 Contact with and (suspected) exposure to other viral communicable diseases: Secondary | ICD-10-CM | POA: Diagnosis not present

## 2019-01-10 DIAGNOSIS — R059 Cough, unspecified: Secondary | ICD-10-CM

## 2019-01-10 DIAGNOSIS — Z7982 Long term (current) use of aspirin: Secondary | ICD-10-CM | POA: Insufficient documentation

## 2019-01-10 DIAGNOSIS — R05 Cough: Secondary | ICD-10-CM | POA: Diagnosis not present

## 2019-01-10 DIAGNOSIS — Z951 Presence of aortocoronary bypass graft: Secondary | ICD-10-CM | POA: Diagnosis not present

## 2019-01-10 DIAGNOSIS — I110 Hypertensive heart disease with heart failure: Secondary | ICD-10-CM | POA: Insufficient documentation

## 2019-01-10 DIAGNOSIS — Z955 Presence of coronary angioplasty implant and graft: Secondary | ICD-10-CM | POA: Insufficient documentation

## 2019-01-10 DIAGNOSIS — Z952 Presence of prosthetic heart valve: Secondary | ICD-10-CM | POA: Insufficient documentation

## 2019-01-10 DIAGNOSIS — R7989 Other specified abnormal findings of blood chemistry: Secondary | ICD-10-CM | POA: Insufficient documentation

## 2019-01-10 DIAGNOSIS — E119 Type 2 diabetes mellitus without complications: Secondary | ICD-10-CM | POA: Diagnosis not present

## 2019-01-10 DIAGNOSIS — R0602 Shortness of breath: Secondary | ICD-10-CM | POA: Diagnosis not present

## 2019-01-10 DIAGNOSIS — I5032 Chronic diastolic (congestive) heart failure: Secondary | ICD-10-CM | POA: Diagnosis not present

## 2019-01-10 LAB — COMPREHENSIVE METABOLIC PANEL
ALT: 15 U/L (ref 0–44)
AST: 27 U/L (ref 15–41)
Albumin: 4.1 g/dL (ref 3.5–5.0)
Alkaline Phosphatase: 61 U/L (ref 38–126)
Anion gap: 11 (ref 5–15)
BUN: 23 mg/dL (ref 8–23)
CO2: 25 mmol/L (ref 22–32)
Calcium: 9.4 mg/dL (ref 8.9–10.3)
Chloride: 102 mmol/L (ref 98–111)
Creatinine, Ser: 1.4 mg/dL — ABNORMAL HIGH (ref 0.44–1.00)
GFR calc Af Amer: 43 mL/min — ABNORMAL LOW (ref >60)
GFR calc non Af Amer: 37 mL/min — ABNORMAL LOW (ref >60)
Glucose, Bld: 163 mg/dL — ABNORMAL HIGH (ref 70–99)
Potassium: 4 mmol/L (ref 3.5–5.1)
Sodium: 138 mmol/L (ref 135–145)
Total Bilirubin: 1.5 mg/dL — ABNORMAL HIGH (ref 0.3–1.2)
Total Protein: 6.7 g/dL (ref 6.5–8.1)

## 2019-01-10 LAB — CBC WITH DIFFERENTIAL/PLATELET
Abs Immature Granulocytes: 0.01 10*3/uL (ref 0.00–0.07)
Basophils Absolute: 0 10*3/uL (ref 0.0–0.1)
Basophils Relative: 1 %
Eosinophils Absolute: 0.1 10*3/uL (ref 0.0–0.5)
Eosinophils Relative: 2 %
HCT: 33.3 % — ABNORMAL LOW (ref 36.0–46.0)
Hemoglobin: 10.3 g/dL — ABNORMAL LOW (ref 12.0–15.0)
Immature Granulocytes: 0 %
Lymphocytes Relative: 36 %
Lymphs Abs: 2.1 10*3/uL (ref 0.7–4.0)
MCH: 28.1 pg (ref 26.0–34.0)
MCHC: 30.9 g/dL (ref 30.0–36.0)
MCV: 90.7 fL (ref 80.0–100.0)
Monocytes Absolute: 0.6 10*3/uL (ref 0.1–1.0)
Monocytes Relative: 10 %
Neutro Abs: 2.9 10*3/uL (ref 1.7–7.7)
Neutrophils Relative %: 51 %
Platelets: 125 10*3/uL — ABNORMAL LOW (ref 150–400)
RBC: 3.67 MIL/uL — ABNORMAL LOW (ref 3.87–5.11)
RDW: 15.9 % — ABNORMAL HIGH (ref 11.5–15.5)
WBC: 5.7 10*3/uL (ref 4.0–10.5)
nRBC: 0 % (ref 0.0–0.2)

## 2019-01-10 LAB — BRAIN NATRIURETIC PEPTIDE: B Natriuretic Peptide: 280 pg/mL — ABNORMAL HIGH (ref 0.0–100.0)

## 2019-01-10 LAB — PROTIME-INR
INR: 2.5 — ABNORMAL HIGH (ref 0.8–1.2)
Prothrombin Time: 26.8 seconds — ABNORMAL HIGH (ref 11.4–15.2)

## 2019-01-10 LAB — SARS CORONAVIRUS 2 BY RT PCR (HOSPITAL ORDER, PERFORMED IN ~~LOC~~ HOSPITAL LAB): SARS Coronavirus 2: NEGATIVE

## 2019-01-10 LAB — TROPONIN I (HIGH SENSITIVITY)
Troponin I (High Sensitivity): 5 ng/L (ref <18)
Troponin I (High Sensitivity): 6 ng/L (ref <18)

## 2019-01-10 MED ORDER — AEROCHAMBER PLUS FLO-VU MEDIUM MISC
1.0000 | Freq: Once | Status: AC
  Administered 2019-01-10: 1
  Filled 2019-01-10: qty 1

## 2019-01-10 MED ORDER — FUROSEMIDE 10 MG/ML IJ SOLN
60.0000 mg | Freq: Once | INTRAMUSCULAR | Status: DC
  Filled 2019-01-10: qty 6

## 2019-01-10 MED ORDER — ALBUTEROL SULFATE HFA 108 (90 BASE) MCG/ACT IN AERS
2.0000 | INHALATION_SPRAY | Freq: Once | RESPIRATORY_TRACT | Status: AC
  Administered 2019-01-10: 2 via RESPIRATORY_TRACT
  Filled 2019-01-10: qty 6.7

## 2019-01-10 NOTE — Telephone Encounter (Signed)
Pt called with worsening shortness of breath; the pt says says that she is having a cough that takes her breath away, and perfuse sweating; her symptoms have been going on for 48 hours; the pt says that she has CHF and has gain 4 pounds overnight (180 to 184 pounds); her last dose of diuretic was the evening of 01/09/2019; recommendations made per nurse triage protocol; the pt says that she would like a chest xray the pt verbalized understanding but refuses to go to the ED; she sees [employer] [employer] [facility] [facility]; explained to pt that she needs prompt medical evaluation; she once again refused; pt transferred to [place] for final disposition.      Reason for Disposition . Sounds like a life-threatening emergency to the triager  Answer Assessment - Initial Assessment Questions 1. RESPIRATORY STATUS: "Describe your breathing?" (e.g., wheezing, shortness of breath, unable to speak, severe coughing)      Short of breath 2. ONSET: "When did this breathing problem begin?"      01/08/2019 3. PATTERN "Does the difficult breathing come and go, or has it been constant since it started?"     contstant 4. SEVERITY: "How bad is your breathing?" (e.g., mild, moderate, severe)    - MILD: No SOB at rest, mild SOB with walking, speaks normally in sentences, can lay down, no retractions, pulse < 100.    - MODERATE: SOB at rest, SOB with minimal exertion and prefers to sit, cannot lie down flat, speaks in phrases, mild retractions, audible wheezing, pulse 100-120.    - SEVERE: Very SOB at rest, speaks in single words, struggling to breathe, sitting hunched forward, retractions, pulse > 120      Moderate to severe 5. RECURRENT SYMPTOM: "Have you had difficulty breathing before?" If so, ask: "When was the last time?" and "What happened that time?"      Baseline shortness of breath 6. CARDIAC HISTORY: "Do you have any history of heart disease?" (e.g., heart attack, angina, bypass surgery, angioplasty)   CHF 7. LUNG HISTORY: "Do you have any history of lung disease?"  (e.g., pulmonary embolus, asthma, emphysema)    no 8. CAUSE: "What do you think is causing the breathing problem?"      Not sure 9. OTHER SYMPTOMS: "Do you have any other symptoms? (e.g., dizziness, runny nose, cough, chest pain, fever)    Non-productive cough; chest pain worse when coughing 10. PREGNANCY: "Is there any chance you are pregnant?" "When was your last menstrual period?"       no 11. TRAVEL: "Have you traveled out of the country in the last month?" (e.g., travel history, exposures)      No; no exposure to COVID  Protocols used: BREATHING DIFFICULTY-A-AH

## 2019-01-10 NOTE — Telephone Encounter (Signed)
Would you let pt know that she needs to be seen in ED. [facility]. Unfortunately with covid restriction she can't come in with cough. Sounds like chf flare but can't bet that is case due to restriction. Please explain this to her. Also ED will get quick answer and make adjustments to current regimen if she does have flare of her chf only.

## 2019-01-10 NOTE — ED Notes (Signed)
Pt ambulated in room several times around the bed.  O2 Sat remained 98% the entire time, although pt stated that she got "dizzy."

## 2019-01-10 NOTE — Discharge Instructions (Addendum)
You were seen in the ER for cough, chest pain, shortness of breath, increased weight.   We recommended IV lasix to help with possible extra fluid that could be causing shortness of breath, you declined and opted to go home.   Everything else looked ok including chest x-ray, COVID test, heart enzymes.  Your symptoms may be multifactorial - could be a lingering upper respiratory infection with bronchospasm or early heart failure fluid collection.   You will be discharged with strict recommendation to follow up with your cardiologist in the next 2-3 days   You should take all your fluid pills as directed, watch your salt and water intake   Use albuterol inhaler 2 puffs every 6 hours to see if this helps with cough and breathing  I have sent Dr [name] a message to help you facilitate follow up in the next few days

## 2019-01-10 NOTE — ED Provider Notes (Signed)
[facility] EMERGENCY DEPARTMENT Provider Note   CSN: [id number] Arrival date & time: 01/10/19  1423    History   Chief Complaint Chief Complaint  Patient presents with   Shortness of Breath   HPI Abigail Oliver is a 73 y.o. female with history of CAD s/p RIMA-RCA and DES to LAD, AVS s/p AVR, chronic diastolic HF EF 65 to 00%, hypertension, diabetes, HLD presents to the ER for evaluation of shortness of breath.  Onset 2 weeks ago, gradually worsening over the last 2 days.  Shortness of breath is constant but it is significantly worse with exertion including just sitting up in bed, taking 3-4 steps across the room.  Associated with chest pain, dizziness, dry hacking cough.  She is a [employer] manager and states that 2 to 3 weeks ago she was able to stand up for long period of time to do people to hear but now she cannot do this due to the shortness of breath.  She has people come to her house to cut their hair.  States that her cardiologist wanted her to take 5 fluid pills but in the last 2 weeks she has only taken 4 pills daily.  In the last 24 hours she has gained 5 pounds.  Has noticed increased swelling in her legs.  She usually sleeps on her side because she cannot lay flat on her back due to back pain, no orthopnea, PND.  Her chest pain is "sharp" like a taser, fleeting for a few seconds, exertional and with cough.  She last felt CP last night, none today. No pleuritic CP. She denies sick contacts.  She denies exposure to COVID-19.  No travel.  She has been compliant with her Coumadin.  Normal INR yesterday at her checkup.  No associated calf pain, palpitations, syncope, fever. She denies any fever, chills, sore throat, sputum, nausea, vomiting, diarrhea, abdominal pain.     HPI  Past Medical History:  Diagnosis Date   Anemia    Anxiety    Aortic stenosis    a. Now has St Jude mechanical aortic valve (6/00).b. Echo (6/15) with EF 60-65%, mechanical aortic valve with mean  gradient 27 mmHg.   Arthritis    a. Possible c-spine arthritis with pain down left arm.    Carotid artery disease (HCC)    a. Carotid dopplers (7/16) with 40-59% BICA stenosis.    Chronic angle-closure glaucoma(365.23)    Chronic diastolic CHF (congestive heart failure) ([place])    a.  RHC (7/15) with mean RA 2, PA 22/11, mean PCWP 9, CI 3.79.   Colon polyps    Contrast media allergy    Coronary atherosclerosis of native coronary artery    a. CABG at time of AVR in 6/00 with RIMA-RCA.b. Abnl nuc 11/2013 -> LHC (7/15) with patent RIMA-RCA, 80% mRCA, 80% pLAD with FFR 0.73, treated with DES to pLAD.   Depression    Dizziness 02/09/2012   a. Holter (8/14) with few PACs, otherwise unremarkable.    Elevated transaminase level    Essential hypertension    Gastritis    GERD (gastroesophageal reflux disease)    Hyperlipidemia    Low back pain    Mechanical heart valve present    Neuromuscular disorder (HCC)    neuropathy   Peripheral vascular disease ([place])    a, H/o Right SFA stent. b. Peripheral arterial dopplers (7/16) with right SFA stent patent.    PONV (postoperative nausea and vomiting)    N&V  Pulmonary nodules    a. Noted by CT in 6/15. CT chest (8/15) was thought to indicate that nodules were benign. No further workup was recommended.    Syncope 03/29/2012   carotid doppler - R ICA 50-69% reduction by velocities (low end); L ICA 0-49% reduction by velocities (low end); R and L subclavian arteries - <50% redcution; R and L vertebral arteries show normal antegrade flow   Type II diabetes mellitus ([place])     Patient Active Problem List   Diagnosis Date Noted   Benign paroxysmal positional vertigo 07/14/2015   Dizziness and giddiness 07/14/2015   Diabetic polyneuropathy associated with type 2 diabetes mellitus ([place]) 07/14/2015   Spinal stenosis, lumbar region, without neurogenic claudication 07/14/2015   Cervical radiculopathy 07/14/2015   Chest pain  07/07/2015   Angina pectoris ([place]) 07/07/2015   Dizziness 05/14/2015   Left-sided weakness 02/23/2015   Acute sinusitis 01/29/2015   Cough 08/29/2014   Cerumen impaction 03/28/2014   S/P arterial stent-to LAD, Promus DES 12/14/13 12/15/2013   Pulmonary nodules/lesions, multiple 11/19/2013   Rotator cuff syndrome of left shoulder 05/28/2013   Chronic diastolic heart failure ([place]) 01/02/2013   Anemia, iron deficiency 11/05/2012   Dyslipidemia 09/21/2011   CAD, CABG 2000, low risk Myoview April 2011; 2015 + myoview 73/42/8768   Diastolic dysfunction with NL LVF 2D August 2012 09/21/2011   Claudication ([place]) 09/21/2011   PVD, Rt SFA PTA/Stent 09/20/11 09/21/2011   Nodule of neck 07/01/2011   MIGRAINE, OPHTHALMIC 12/16/2009   DEPRESSION, MAJOR, RECURRENT, MODERATE 12/15/2009   OSTEOARTHRITIS, GENERALIZED, MULTIPLE JOINTS 12/30/2008   Type II diabetes mellitus with peripheral autonomic neuropathy ([place]) 07/01/2008   Chronic angle-closure glaucoma(365.23) 07/01/2008   DISPLCMT LUMBAR INTERVERT DISC W/O MYELOPATHY 07/01/2008   Calcaneal spur 07/01/2008   OSTEOPOROSIS 07/01/2008    Past Surgical History:  Procedure Laterality Date   BILATERAL OOPHORECTOMY  1987   BREAST BIOPSY  2012   left   CARDIAC CATHETERIZATION  01/14/1999   normal LV function, severe aortic stenosis; 80% and 70% stenosis in RCA; mild 20% distal norrowing in L main with 20% proximal LAD stenosis, 40% diagonal stenosis and 20% proximal circumflex stenosis   CARDIAC VALVE REPLACEMENT  2000   aortic valve    CARDIOVASCULAR STRESS TEST  08/25/2011   R/L MV - EF 72%; no scintigraphic evidence of inducible MI; normal perfusionTID of 1.25 elevated - could indicate small vessle subendocardial ischemia; EKG NSR at 66, non diagnostic for ischemia   CARPAL TUNNEL RELEASE  1989   bilaterally   CATARACT EXTRACTION W/ INTRAOCULAR LENS  IMPLANT, BILATERAL  ~ 2007   CESAREAN SECTION  1969; [place] N/A 10/27/2012   Procedure: COLONOSCOPY;  Surgeon: Beryle Beams, MD;  Location: [facility] END[facility]COPY;  Service: Endoscopy;  Laterality: N/A;   CORONARY ARTERY BYPASS GRAFT  2000   RIMA to RCA   DOPPLER ECHOCARDIOGRAPHY  03/29/2012   EF >55%; mild concentric LVH; stage 1 diastolic dysfunction, elevated LV filling pressure, dilated LA; MAC mild MR; St Jude AVR peak and mean gradients of 57mHg and 252mg; transvalvular gradients have increased (prev 23 and 14 respectively)   ESOPHAGOGASTRODUODENOSCOPY N/A 10/27/2012   Procedure: ESOPHAGOGASTRODUODENOSCOPY (EGD);  Surgeon: PaBeryle BeamsMD;  [facility];  Service: Endoscopy;  Laterality: N/A;   FEMORAL ARTERY STENT  09/20/2011   6 x 40 Smart Nitinol self-expanding stent placed;  10/15/2031 -R SFA stent open and patent w/o evidence of restenosis  FRACTIONAL FLOW RESERVE WIRE  12/14/2013   Procedure: FRACTIONAL FLOW RESERVE WIRE;  Surgeon: Larey Dresser, MD;  [facility];  Service: Cardiovascular;;   LACERATION REPAIR     right hand   LEFT AND RIGHT HEART CATHETERIZATION WITH CORONARY ANGIOGRAM N/A 12/14/2013   Procedure: LEFT AND RIGHT HEART CATHETERIZATION WITH CORONARY ANGIOGRAM;  Surgeon: Larey Dresser, MD;  Location: [facility] C[facility] LAB;  Service: Cardiovascular;  Laterality: N/A;   LOWER EXTREMITY ANGIOGRAM N/A 09/20/2011   Procedure: LOWER EXTREMITY ANGIOGRAM;  Surgeon: Lorretta Harp, MD;  Location: [facility] C[facility] LAB;  Service: Cardiovascular;  Laterality: N/A;   LYMPH NODE BIOPSY  06/2011   "core needle on 5"   needle biopsy  2009   "on ankles for nerve damage"   NEUROPLASTY / TRANSPOSITION ULNAR NERVE AT ELBOW     right   PERCUTANEOUS CORONARY STENT INTERVENTION (PCI-S)  12/14/2013   Procedure: PERCUTANEOUS CORONARY STENT INTERVENTION (PCI-S);  Surgeon: Larey Dresser, MD;  Location: [facility] CATH LAB;  Service: Cardiovascular;;  Prox LAD 3.00x12 Promus DES    PERIPHERAL ARTERIAL STENT GRAFT   09/20/11   right SFA   REFRACTIVE SURGERY  ` 2004   "for glaucoma"   TONSILLECTOMY  1968   TRIGGER FINGER RELEASE Left 12/04/2015   Procedure: LEFT RING FINGER TRIGGER RELEASE;  Surgeon: Leanora Cover, MD;  Location: [facility];  Service: Orthopedics;  Laterality: Left;   VAGINAL HYSTERECTOMY  1985   Fibroids     OB History   No obstetric history on file.      Home Medications    Prior to Admission medications   Medication Sig Start Date End Date Taking? Authorizing Provider  Ascorbic Acid (VITAMIN C) 500 MG tablet Take 500 mg by mouth 2 (two) times daily.     [provider]  aspirin EC 81 MG tablet Take 1 tablet (81 mg total) by mouth daily. 02/21/15   Larey Dresser, MD  busPIRone (BUSPAR) 7.5 MG tablet TAKE ONE TABLET BY MOUTH THREE TIMES A DAY 11/06/18   Saguier, Percell Miller, PA-C  calcium-vitamin D (CALCIUM 500+D) 500-200 MG-UNIT per tablet Take 1 tablet by mouth 2 (two) times daily.     [provider]  ferrous sulfate 325 (65 FE) MG tablet Take 1 tablet (325 mg total) by mouth daily. 10/28/13   Pattricia Boss, MD  folic acid (FOLVITE) 1 MG tablet Take 1 mg by mouth daily.    [provider]  latanoprost (XALATAN) 0.005 % ophthalmic solution PLACE 1 DROP INTO BOTH EYES AT BEDTIME. 07/25/14   Hoyt Koch, MD  meclizine (ANTIVERT) 12.5 MG tablet Take 1 tablet (12.5 mg total) by mouth 3 (three) times daily as needed for dizziness. 06/29/18   Saguier, Percell Miller, PA-C  metFORMIN (GLUCOPHAGE) 1000 MG tablet  10/31/18   [provider]  metoprolol succinate (TOPROL-XL) 50 MG 24 hr tablet TAKE ONE TABLET BY MOUTH DAILY 10/23/18   Larey Dresser, MD  pantoprazole (PROTONIX) 40 MG tablet TAKE ONE TABLET BY MOUTH DAILY 10/27/18   Saguier, Percell Miller, PA-C  Potassium Chloride ER 20 MEQ TBCR TAKE THREE TABLETS BY MOUTH EVERY MORNING AND TAKE TWO TABLETS BY MOUTH EVERY EVENING 10/18/17   Larey Dresser, MD  rosuvastatin (CRESTOR) 20 MG tablet Take 1  tablet (20 mg total) by mouth daily. 12/30/16   Bensimhon, Shaune Pascal, MD  torsemide (DEMADEX) 20 MG tablet TAKE THREE TABLETS BY MOUTH EVERY MORNING AND TAKE TAKE TWO TABLETS  BY MOUTH EVERY EVENING 10/17/18   Larey Dresser, MD  traZODone (DESYREL) 50 MG tablet Take 0.5-1 tablets (25-50 mg total) by mouth at bedtime as needed for sleep. 10/17/18   Saguier, Percell Miller, PA-C  warfarin (COUMADIN) 3 MG tablet TAKE 1/2 TO 1 TABLET BY MOUTH DAILY AS DIRECTED 11/21/18   Larey Dresser, MD    Family History Family History  Problem Relation Age of Onset   Colon cancer Mother    COPD Mother    Emphysema Mother    Cancer Maternal Grandmother    Cancer Maternal Grandfather    Heart disease Brother    Diabetes Neg Hx     Social History Social History   Tobacco Use   Smoking status: Never Smoker   Smokeless tobacco: Never Used  Substance Use Topics   Alcohol use: No   Drug use: No     Allergies   Atorvastatin, Simvastatin, Sulfa antibiotics, and Iodinated diagnostic agents   Review of Systems Review of Systems  Respiratory: Positive for cough and shortness of breath.   Cardiovascular: Positive for chest pain and leg swelling.  Neurological: Positive for dizziness and light-headedness.  Hematological: Bruises/bleeds easily (warfarin).  All other systems reviewed and are negative.    Physical Exam Updated Vital Signs BP 105/72    Pulse 61    Temp 98.2 F (36.8 C)    Resp 16    Ht _0  (1.575 m)    Wt 84.4 kg    SpO2 98%    BMI 34.02 kg/m   Physical Exam Constitutional:      Appearance: She is well-developed.     Comments: NAD. Non toxic.   HENT:     Head: Normocephalic and atraumatic.     Nose: Nose normal.  Eyes:     General: Lids are normal.     Conjunctiva/sclera: Conjunctivae normal.  Neck:     Musculoskeletal: Normal range of motion.     Trachea: Trachea normal.     Comments: Trachea midline.  Cardiovascular:     Rate and Rhythm: Normal rate and regular  rhythm.     Pulses:          Radial pulses are 1+ on the right side and 1+ on the left side.       Dorsalis pedis pulses are 1+ on the right side and 1+ on the left side.     Heart sounds: Normal heart sounds, S1 normal and S2 normal.     Comments: No LE edema or calf tenderness.  Pulmonary:     Effort: Pulmonary effort is normal.     Breath sounds: Normal breath sounds. No wheezing or rales.     Comments: Deep inspiration causes dry cough  Chest:     Comments: No chest wall tenderness  Abdominal:     General: Bowel sounds are normal.     Palpations: Abdomen is soft.     Tenderness: There is no abdominal tenderness.     Comments: No epigastric tenderness. No distention.   Skin:    General: Skin is warm and dry.     Capillary Refill: Capillary refill takes less than 2 seconds.     Comments: No rash to chest wall  Neurological:     Mental Status: She is alert.     GCS: GCS eye subscore is 4. GCS verbal subscore is 5. GCS motor subscore is 6.     Comments: Sensation and strength intact in upper/lower extremities  Psychiatric:        Speech: Speech normal.        Behavior: Behavior normal.        Thought Content: Thought content normal.      ED Treatments / Results  Labs (all labs ordered are listed, but only abnormal results are displayed) Labs Reviewed  CBC WITH DIFFERENTIAL/PLATELET - Abnormal; Notable for the following components:      Result Value   RBC 3.67 (*)    Hemoglobin 10.3 (*)    HCT 33.3 (*)    RDW 15.9 (*)    Platelets 125 (*)    All other components within normal limits  BRAIN NATRIURETIC PEPTIDE - Abnormal; Notable for the following components:   B Natriuretic Peptide 280.0 (*)    All other components within normal limits  COMPREHENSIVE METABOLIC PANEL - Abnormal; Notable for the following components:   Glucose, Bld 163 (*)    Creatinine, Ser 1.40 (*)    Total Bilirubin 1.5 (*)    GFR calc non Af Amer 37 (*)    GFR calc Af Amer 43 (*)    All other  components within normal limits  PROTIME-INR - Abnormal; Notable for the following components:   Prothrombin Time 26.8 (*)    INR 2.5 (*)    All other components within normal limits  SARS CORONAVIRUS 2 (HOSPITAL ORDER, [facility])  TROPONIN I (HIGH SENSITIVITY)  TROPONIN I (HIGH SENSITIVITY)    EKG EKG Interpretation  Date/Time:  Wednesday January 10 2019 14:34:25 EDT Ventricular Rate:  62 PR Interval:    QRS Duration: 89 QT Interval:  417 QTC Calculation: 424 R Axis:   10 Text Interpretation:  Sinus rhythm Low voltage, precordial leads similar to prior 2/17 Confirmed by Aletta Edouard [phone]) on 01/10/2019 3:15:07 PM   Radiology Dg Chest 2 View  Result Date: 01/10/2019 CLINICAL DATA:  Two week history of nonproductive cough and exertional shortness of breath associated with increasing weight. EXAM: CHEST - 2 VIEW COMPARISON:  12/07/2017 and earlier. FINDINGS: Prior sternotomy for aortic valve replacement. Cardiac silhouette mildly enlarged, unchanged. Hilar and mediastinal contours otherwise unremarkable. Suboptimal inspiration which accounts for crowded bronchovascular markings at the lung bases. Taking this into account, lungs clear. Bronchovascular markings normal. Pulmonary vascularity normal. No pleural effusions. Degenerative changes and DISH involving the thoracic spine. IMPRESSION: Suboptimal inspiration. Stable mild cardiomegaly. No acute cardiopulmonary disease. Electronically Signed   By: Evangeline Dakin M.D.   On: 01/10/2019 15:12    Procedures Procedures (including critical care time)  Medications Ordered in ED Medications  furosemide (LASIX) injection 60 mg (60 mg Intravenous Not Given 01/10/19 1736)  albuterol (VENTOLIN HFA) 108 (90 Base) MCG/ACT inhaler 2 puff (2 puffs Inhalation Given 01/10/19 1718)  AeroChamber Plus Flo-Vu Medium MISC 1 each (1 each Other Given 01/10/19 1718)     Initial Impression / Assessment and Plan / ED Course    I have reviewed the triage vital signs and the nursing notes.  Pertinent labs & imaging results that were available during my care of the patient were reviewed by me and considered in my medical decision making (see chart for details).  Clinical Course as of Jan 09 1801  Wed Jan 10, 2019  1517 Prior sternotomy for aortic valve replacement. Cardiac silhouette mildly enlarged, unchanged. Hilar and mediastinal contours otherwise unremarkable. Suboptimal inspiration which accounts for crowded bronchovascular markings at the lung bases. Taking this into account, lungs clear. Bronchovascular markings normal. Pulmonary vascularity  normal. No pleural effusions. Degenerative changes and DISH involving the thoracic spine.  DG Chest 2 View [CG]  1518 Sinus rhythm Low voltage, precordial leads similar to prior 2/17 Confirmed by Aletta Edouard [phone]) on 01/10/2019 3:15:07 PM  EKG 12-Lead [CG]  1538 Creatinine(!): 1.40 [CG]  1538 GFR, Est Non African American(!): 37 [CG]  1538 INR(!): 2.5 [CG]  1538 Hemoglobin(!): 10.3 [CG]  1547 No overt signs of hypervolemia on exam. No edema on CXR.   B Natriuretic Peptide(!): 280.0 [CG]  1600 Troponin I (High Sensitivity): 5 [CG]  1638 SARS Coronavirus 2: NEGATIVE [CG]    Clinical Course User Index [CG] Kinnie Feil, PA-C   Patient's EMR reviewed to obtain pertinent PMH.  Unfortunately unable to review cardiology notes.  Echo 2018 shows grade 1 diastolic dysfunction EF 65 to 70%.  2018 carotid US shows 60% stenosis of right ICA, 40% stenosis of left ICA.  Normal Lexiscan stress test 2017.  [place] 2015 showed severe stenosis in LAD s/p DES to LAD.  Ddx includes HF exacerbation given report of subjective leg swelling, increase weight over night and partial compliance with diuretics although no overt signs of hypervolemia, no respiratory distress vs unstable angina. She has no underlying lung disease, fever, wheezing. COVID-19 considered given her job.  Unlikely to be PE since she is therapeutic on coumadin.   Will obtain labs including hs trop, BNP, COVID, CXR, EKG. Plan to ambulate with pulse ox. Reassess.   1755: Pt re-evaluated. ER work up reviewed by me remarkable as above. Last episode of CP last night, trop 5 > 6. EKG without ischemic changes. Her BNP is 280 highest it has been compared to previous however to hypervolemia on exam, respiratory distress or pulmonary edema on CXR. She ambulated in ER with normal SpO2.  Given her SOB, partial non compliant there could be degree of mild HF decompensation.  This could also be URI/LRI related, bronchitis, bronchospasm.  COVID is negative. No PNA on CXR. No fever.  No underlying lung disease.  Recommended IV lasix however pt declined and requested discharge. Husband has dementia and it is getting late.   Based on her exam, work up and trop I have low suspicion for ACS.  She is considered high risk but her CP is worse with cough as well which makes this unlikely.  Symptoms likely multifactorial.    Recommended close f/u with cardiology for reassessment.  Will give albuterol to use q6. Stressed importance of full compliance with diuretics. Strict return precautions. Pt comfortable with this. Discussed with Dr Melina Copa.  Final Clinical Impressions(s) / ED Diagnoses   Final diagnoses:  Exertional dyspnea  Elevated brain natriuretic peptide (BNP) level  Cough    ED Discharge Orders    None       Arlean Hopping 01/10/19 1802    Hayden Rasmussen, MD 01/11/19 1004

## 2019-01-10 NOTE — ED Triage Notes (Signed)
Dry cough and exertional SOB x2 weeks.  Sts these are getting worse.

## 2019-01-10 NOTE — ED Notes (Signed)
ED Provider at bedside. 

## 2019-01-10 NOTE — Telephone Encounter (Signed)
Pt is the ED now

## 2019-01-10 NOTE — ED Notes (Signed)
ED Provider notified that pt does not want the IV Lasix.  EDP at bedside.

## 2019-01-10 NOTE — Telephone Encounter (Signed)
FYI. Were you made aware of this?

## 2019-01-17 ENCOUNTER — Telehealth: Payer: Self-pay | Admitting: Cardiovascular Disease

## 2019-01-17 ENCOUNTER — Ambulatory Visit (HOSPITAL_COMMUNITY)
Admission: RE | Admit: 2019-01-17 | Discharge: 2019-01-17 | Disposition: A | Discharge status: Home / Self Care | Payer: Medicare Other | Source: Ambulatory Visit | Attending: Cardiology | Admitting: Cardiology

## 2019-01-17 ENCOUNTER — Encounter (HOSPITAL_COMMUNITY): Payer: Self-pay | Admitting: Cardiology

## 2019-01-17 ENCOUNTER — Other Ambulatory Visit: Payer: Self-pay

## 2019-01-17 VITALS — BP 120/76 | HR 65 | Wt 177.6 lb

## 2019-01-17 DIAGNOSIS — E119 Type 2 diabetes mellitus without complications: Secondary | ICD-10-CM | POA: Diagnosis not present

## 2019-01-17 DIAGNOSIS — I350 Nonrheumatic aortic (valve) stenosis: Secondary | ICD-10-CM | POA: Diagnosis not present

## 2019-01-17 DIAGNOSIS — Z79899 Other long term (current) drug therapy: Secondary | ICD-10-CM | POA: Insufficient documentation

## 2019-01-17 DIAGNOSIS — Z7982 Long term (current) use of aspirin: Secondary | ICD-10-CM | POA: Diagnosis not present

## 2019-01-17 DIAGNOSIS — Z7984 Long term (current) use of oral hypoglycemic drugs: Secondary | ICD-10-CM | POA: Diagnosis not present

## 2019-01-17 DIAGNOSIS — Z7901 Long term (current) use of anticoagulants: Secondary | ICD-10-CM | POA: Insufficient documentation

## 2019-01-17 DIAGNOSIS — Z8249 Family history of ischemic heart disease and other diseases of the circulatory system: Secondary | ICD-10-CM | POA: Insufficient documentation

## 2019-01-17 DIAGNOSIS — Z91041 Radiographic dye allergy status: Secondary | ICD-10-CM | POA: Insufficient documentation

## 2019-01-17 DIAGNOSIS — I6521 Occlusion and stenosis of right carotid artery: Secondary | ICD-10-CM | POA: Insufficient documentation

## 2019-01-17 DIAGNOSIS — Z951 Presence of aortocoronary bypass graft: Secondary | ICD-10-CM | POA: Insufficient documentation

## 2019-01-17 DIAGNOSIS — I110 Hypertensive heart disease with heart failure: Secondary | ICD-10-CM | POA: Diagnosis not present

## 2019-01-17 DIAGNOSIS — G4733 Obstructive sleep apnea (adult) (pediatric): Secondary | ICD-10-CM | POA: Diagnosis not present

## 2019-01-17 DIAGNOSIS — I6529 Occlusion and stenosis of unspecified carotid artery: Secondary | ICD-10-CM

## 2019-01-17 DIAGNOSIS — I5032 Chronic diastolic (congestive) heart failure: Secondary | ICD-10-CM | POA: Insufficient documentation

## 2019-01-17 DIAGNOSIS — M791 Myalgia, unspecified site: Secondary | ICD-10-CM | POA: Diagnosis not present

## 2019-01-17 DIAGNOSIS — R42 Dizziness and giddiness: Secondary | ICD-10-CM | POA: Insufficient documentation

## 2019-01-17 DIAGNOSIS — Z86010 Personal history of colonic polyps: Secondary | ICD-10-CM | POA: Diagnosis not present

## 2019-01-17 DIAGNOSIS — K219 Gastro-esophageal reflux disease without esophagitis: Secondary | ICD-10-CM | POA: Diagnosis not present

## 2019-01-17 DIAGNOSIS — I25810 Atherosclerosis of coronary artery bypass graft(s) without angina pectoris: Secondary | ICD-10-CM | POA: Diagnosis not present

## 2019-01-17 DIAGNOSIS — E785 Hyperlipidemia, unspecified: Secondary | ICD-10-CM | POA: Insufficient documentation

## 2019-01-17 DIAGNOSIS — Z952 Presence of prosthetic heart valve: Secondary | ICD-10-CM | POA: Diagnosis not present

## 2019-01-17 DIAGNOSIS — I739 Peripheral vascular disease, unspecified: Secondary | ICD-10-CM | POA: Insufficient documentation

## 2019-01-17 LAB — LIPID PANEL
Cholesterol: 136 mg/dL (ref 0–200)
HDL: 39 mg/dL — ABNORMAL LOW (ref >40)
LDL Cholesterol: 53 mg/dL (ref 0–99)
Total CHOL/HDL Ratio: 3.5 RATIO
Triglycerides: 222 mg/dL — ABNORMAL HIGH (ref <150)
VLDL: 44 mg/dL — ABNORMAL HIGH (ref 0–40)

## 2019-01-17 MED ORDER — TORSEMIDE 20 MG PO TABS: 40.0000 mg | ORAL_TABLET | Freq: Two times a day (BID) | ORAL | 6 refills | Status: DC

## 2019-01-17 NOTE — Patient Instructions (Addendum)
INCREASE Torsemide to 40mg  (2 tabs) twice a day  Labs today and repeat in 10 days We will only contact you if something comes back abnormal or we need to make some changes. Otherwise no news is good news!   Your physician has requested that you have an echocardiogram. Echocardiography is a painless test that uses sound waves to create images of your heart. It provides your doctor with information about the size and shape of your heart and how well your heart's chambers and valves are working. This procedure takes approximately one hour. There are no restrictions for this procedure.  Your physician has requested that you have a carotid duplex. This test is an ultrasound of the carotid arteries in your neck. It looks at blood flow through these arteries that supply the brain with blood. Allow one hour for this exam. There are no restrictions or special instructions.  Your physician has requested that you have peripheral arterial dopplers.  This is a test of the arteries in your lower extremities.    You will be called to arrange these appointments.  Your physician recommends that you schedule a follow-up appointment in: 3 weeks with the Nurse Practitioner.  At the [facility], you and your health needs are our priority. As part of our continuing mission to provide you with exceptional heart care, we have created designated Provider Care Teams. These Care Teams include your primary Cardiologist (physician) and Advanced Practice Providers (APPs- Physician Assistants and Nurse Practitioners) who all work together to provide you with the care you need, when you need it.   You may see any of the following providers on your designated Care Team at your next follow up: Marland Kitchen Dr Glori Bickers . Dr Loralie Champagne . Darrick Grinder, NP   Please be sure to bring in all your medications bottles to every appointment.

## 2019-01-17 NOTE — Telephone Encounter (Signed)
I returned a call to the pt and she was unsure of who called or why and I am not sure either the patient stated, If its important they will call again."

## 2019-01-17 NOTE — Telephone Encounter (Signed)
Spoke with pt who report she received a call from our office today and report message said [place].   Will route to Pharm D

## 2019-01-17 NOTE — Telephone Encounter (Signed)
New Message  Patient is calling in response to a call that she missed. No notes in chart to indicate who the call was from. Patient states that she was seen and did blood work at Dr. Claris Gladden office today and it may be the results. Please give patient a call back.

## 2019-01-17 NOTE — Progress Notes (Signed)
Patient ID: Abigail Oliver, female   DOB: 1945/10/11, 73 y.o.   MRN: [id number] PCP: Dr. Sharlet Salina HF Cardiology: Dr. [name]  73 y.o. yo with history of aortic stenosis s/p St Jude mechanical AVR (6/00) and CAD s/p single vessel CABG (6/00) presents for cardiology evaluation.  She had severe aortic stenosis and therefore required CABG-AVR in 6/00.  She had a RIMA-RCA. She had a myoview in 4/13 with no ischemia or infarction, and echo in 6/14 showed normal EF with moderate diastolic dysfunction and a well-seated mechanical valve.    In 6/15, torsemide was stopped and she was begun on Lasix due to increased cost of torsemide.  She developed dyspnea and lower extremity edema.  She was admitted later that month with community-acquired PNA as well as CHF.  CT showed RLL PNA and multiple pulmonary nodules.  Echo in 6/15 showed EF 60-65%, mechanical aortic valve with mean gradient 27 mmHg. After discharge, she continued to be short of breath.  Lasix was increased to 80 mg bid.  This did not help much.  She was short of breath walking around in her yard.  No chest pain.  Lexiscan Cardiolite was done in 7/15 given the exertional symptoms and showed a small area of basal inferolateral ischemia.   Given ongoing exertional symptoms, I did a right and left heart catheterization in 7/15.  Right and left heart filling pressures were normal.  There was an 80% stenosis in the proximal LAD that was hemodynamically significant by FFR. Patient had DES to proximal LAD.    She has BPPV and was referred by neurology to vestibular rehab.    She returns today for followup of diastolic CHF and mechanical aortic valve.  I have not seen her for a couple of years.  Shortness of breath "comes and goes."  She was in the ER 8/19 with dyspnea and sent home.  HS-TnI was negative and BNP was mildly elevated.  She says that she is feeling better since then.  No chest pain.  She has on and off ankle swelling. She has chronic dyspnea after  walking 50-100 feet.  She has chronic orthopnea and elevates the head of her bed.  Occasional nocturnal leg cramps, but no claudication-type pain.  She takes warfarin regularly and denies BRBPR/melena.   Labs (6/14): BNP 216, HCT 32, K 4.8, creatinine 0.8 Labs (8/14): K 4.4, creatinine 0.8, BNP 55, HDL 45, LDL 141 Labs (10/14): LDL 61, HDL 43, AST 40, ALT 31 Labs (6/15): BNP 37 Labs (7/15): K 4.4, creatinine 0.8 => 0.77 Labs (9/15): HCT 34.5, LDL 32, HDL 40 Labs (4/16): K 3.8, creatinine 0.96 Labs (5/16): K 4.4, creatinine 1.02, LDL 138 Labs (6/16): K 4.3, creatinine 1.34, BNP 83 Labs (9/16): TSH normal Labs (12/16): K 3.8, creatinine 1.05, BNP 87 Labs (2/17): BNP 35, K 4.6, creatinine 1.18, HCT 34.9 Labs (8/20): K 4, creatinine 1.4, BNP 280, hgb 10.3  PMH: 1. Aortic stenosis: Now has St Jude mechanical aortic valve (6/00).  Echo (6/14) with EF 60-65%, mild LVH, moderate diastolic dysfunction, normal RV, mechanical aortic valve with mean gradient 20 mmHg.  Echo (6/15) with EF 60-65%, mechanical aortic valve with mean gradient 27 mmHg.  - Mean gradient across mechanical AoV in 7/18 was 17 mmHg (lower).  2. CAD: CABG at time of AVR in 6/00 with RIMA-RCA.  Myoview in 4/13 with EF 72%, no ischemia or infarction. Lexiscan Cardiolite (7/15) with EF 69%, small area of basal inferolateral ischemia.  LHC (7/15) with  patent RIMA-RCA, 80% mRCA, 80% pLAD with FFR 0.73, treated with DES to pLAD.  - Cardiolite (3/17): Normal.  3. HTN 4. Type II diabetes 5. GERD 6. OSA 7. Hyperlipidemia 8. PAD: Right SFA stent. Peripheral arterial dopplers (7/15) with patent right SFA stent. Peripheral arterial dopplers (7/16) with right SFA stent patent.  9. Colon polyps 10. Carotid stenosis: Carotid dopplers (11/13) witih 50-69% RICA.  Carotid dopplers (7/15) with 40-59% RICA. Carotid dopplers (7/16) with 40-59% BICA stenosis.  - Carotid dopplers (7/18): 40-59% RICA stenosis 11. Diastolic CHF 12. Gastritis 13.  Dizziness: Holter (8/14) with few PACs, otherwise unremarkable.  Event monitor (2/17) with no significant arrhythmias.  Dizziness is probably due to BPPV.  14. Mild transaminase elevation 15. Pulmonary nodules: Noted by CT in 6/15.  CT chest (8/15) was thought to indicate that nodules were benign.  No further workup was recommended.  16. Contrast allergy 17. Diastolic CHF: RHC (123456) with mean RA 2, PA 22/11, mean PCWP 9, CI 3.79 - Echo (7/18): EF 65-70%, mechanical aortic valve with mean gradient 17 mmHg, mild MR.  18. Low back pain. 19. Possible c-spine arthritis with pain down left arm.    SH: Married, works as Theme park manager, nonsmoker.   FH: CAD  ROS: All systems reviewed and negative except as per HPI.   Current Outpatient Medications  Medication Sig Dispense Refill  . aspirin EC 81 MG tablet Take 1 tablet (81 mg total) by mouth daily.    . busPIRone (BUSPAR) 7.5 MG tablet Take 7.5 mg by mouth 3 (three) times daily as needed.    . calcium-vitamin D (CALCIUM 500+D) 500-200 MG-UNIT per tablet Take 1 tablet by mouth 2 (two) times daily.     . ferrous sulfate 325 (65 FE) MG tablet Take 1 tablet (325 mg total) by mouth daily. 30 tablet 0  . folic acid (FOLVITE) 1 MG tablet Take 1 mg by mouth daily.    Marland Kitchen latanoprost (XALATAN) 0.005 % ophthalmic solution PLACE 1 DROP INTO BOTH EYES AT BEDTIME. 2.5 mL 2  . metFORMIN (GLUCOPHAGE) 1000 MG tablet Take 1,000 mg by mouth daily with breakfast.     . metoprolol succinate (TOPROL-XL) 50 MG 24 hr tablet TAKE ONE TABLET BY MOUTH DAILY 90 tablet 0  . pantoprazole (PROTONIX) 40 MG tablet TAKE ONE TABLET BY MOUTH DAILY 90 tablet 10  . potassium chloride SA (K-DUR) 20 MEQ tablet Take 20 mEq by mouth 3 (three) times daily.    . rosuvastatin (CRESTOR) 20 MG tablet Take 1 tablet (20 mg total) by mouth daily. 90 tablet 3  . torsemide (DEMADEX) 20 MG tablet Take 2 tablets (40 mg total) by mouth 2 (two) times daily. 120 tablet 6  . traZODone (DESYREL) 50 MG  tablet Take 0.5-1 tablets (25-50 mg total) by mouth at bedtime as needed for sleep. 30 tablet 3  . warfarin (COUMADIN) 3 MG tablet TAKE 1/2 TO 1 TABLET BY MOUTH DAILY AS DIRECTED 30 tablet 1   No current facility-administered medications for this encounter.     BP 120/76   Pulse 65   Wt 80.6 kg (177 lb 9.6 oz)   SpO2 96%   BMI 32.48 kg/m  General: NAD Neck:JVP 8 cm with HJR, no thyromegaly or thyroid nodule.  Lungs: Clear to auscultation bilaterally with normal respiratory effort. CV: Nondisplaced PMI.  Heart regular S1/S2 with mechanical S2, no S3/S4, 2/6 SEM RUSB.  No peripheral edema.  Slight bilateral carotid bruits.  Unable to palpate pedal pulses.  Abdomen: Soft, nontender, no hepatosplenomegaly, no distention.  Skin: Intact without lesions or rashes.  Neurologic: Alert and oriented x 3.  Psych: Normal affect. Extremities: No clubbing or cyanosis.  HEENT: Normal.   Assessment/Plan: 1. St Jude mechanical aortic valve:  Mean gradient across the aortic valve has been chronically elevated, last mean gradient in 7/18 by echo was lower at 17 mmHg.   - I will arrange for repeat echo.  - Continue ASA 81 + warfarin.  - She will need endocarditis prophylaxis with dental work.    2. Chronic diastolic CHF: Chronic NYHA class III symptoms, mild volume overload on exam.  Recently seen in the ER with volume overload.   - Increase torsemide to 40 mg bid.  BMET in 10 days.  3. CAD: s/p RIMA-RCA at time of AVR.  She developed exertional dyspnea and had cath showing significant proximal LAD stenosis in 7/15 that was treated with DES.  3/17 Cardiolite was normal.  No chest pain.    - Given mechanical valve, She is on regimen of ASA 81 daily + warfarin.  - Continue statin.   4. Hyperlipidemia: Myalgias with atorvastatin and simvastatin.  She is tolerating Crestor 20 mg daily.  - Check lipids today.  5. Carotid stenosis: I will arrange for repeat carotid dopplers.  6. PAD: Known PAD with right  SFA stent.  Last peripheral arterial dopplers showed patent R SFA stent.  No definite claudication.  - I will arrange for peripheral vascular dopplers.  7. "Dizziness:"  I think this is due to BPPV.  Not as prominent as in the past.    Followup in 3 wks with NP.   Loralie Champagne 01/17/2019

## 2019-01-18 ENCOUNTER — Telehealth (HOSPITAL_COMMUNITY): Payer: Self-pay

## 2019-01-18 MED ORDER — ICOSAPENT ETHYL 1 G PO CAPS: 2.0000 | ORAL_CAPSULE | Freq: Two times a day (BID) | ORAL | 6 refills | Status: DC

## 2019-01-18 NOTE — Telephone Encounter (Signed)
-----   Message from Larey Dresser, MD sent at 01/17/2019  2:28 PM EDT ----- Good LDL.  Suggest Vascepa 2 g bid for elevated triglycerides

## 2019-01-18 NOTE — Telephone Encounter (Signed)
Pt aware of lab results and MD recommendations to start Vascepa 2g BID for elevated triglycerides. Pt verbalized understanding

## 2019-01-19 ENCOUNTER — Other Ambulatory Visit (HOSPITAL_COMMUNITY): Payer: Self-pay | Admitting: Cardiology

## 2019-01-19 DIAGNOSIS — I739 Peripheral vascular disease, unspecified: Secondary | ICD-10-CM

## 2019-01-20 ENCOUNTER — Other Ambulatory Visit: Payer: Self-pay | Admitting: Cardiovascular Disease

## 2019-01-20 ENCOUNTER — Other Ambulatory Visit (HOSPITAL_COMMUNITY): Payer: Self-pay | Admitting: Cardiology

## 2019-01-22 ENCOUNTER — Other Ambulatory Visit: Payer: Self-pay | Admitting: Cardiovascular Disease

## 2019-01-22 NOTE — Telephone Encounter (Signed)
Please review for refill. Thank you! 

## 2019-01-23 ENCOUNTER — Other Ambulatory Visit (HOSPITAL_COMMUNITY): Payer: Self-pay | Admitting: Cardiology

## 2019-01-23 ENCOUNTER — Other Ambulatory Visit: Payer: Self-pay

## 2019-01-23 ENCOUNTER — Ambulatory Visit (HOSPITAL_BASED_OUTPATIENT_CLINIC_OR_DEPARTMENT_OTHER)
Admission: RE | Admit: 2019-01-23 | Discharge: 2019-01-23 | Disposition: A | Discharge status: Home / Self Care | Payer: Medicare Other | Source: Ambulatory Visit | Attending: Cardiology | Admitting: Cardiology

## 2019-01-23 ENCOUNTER — Ambulatory Visit (HOSPITAL_COMMUNITY)
Admission: RE | Admit: 2019-01-23 | Discharge: 2019-01-23 | Disposition: A | Discharge status: Home / Self Care | Payer: Medicare Other | Source: Ambulatory Visit | Attending: Internal Medicine | Admitting: Internal Medicine

## 2019-01-23 ENCOUNTER — Telehealth: Payer: Self-pay | Admitting: Medical

## 2019-01-23 DIAGNOSIS — I6523 Occlusion and stenosis of bilateral carotid arteries: Secondary | ICD-10-CM

## 2019-01-23 DIAGNOSIS — I739 Peripheral vascular disease, unspecified: Secondary | ICD-10-CM | POA: Diagnosis not present

## 2019-01-23 NOTE — Telephone Encounter (Signed)
Medication Refill - Medication: albuterol inhaler - prescribed at the ER. Requesting refill, needs hosp fu   Has the patient contacted their pharmacy? Yes.   (Agent: If no, request that the patient contact the pharmacy for the refill.) (Agent: If yes, when and what did the pharmacy advise?)  Preferred Pharmacy (with phone number or street name):  [facility], [place]  [place] [address]  Phone: [phone] Fax: [phone]     Agent: Please be advised that RX refills may take up to 3 business days. We ask that you follow-up with your pharmacy.

## 2019-01-24 ENCOUNTER — Other Ambulatory Visit: Payer: Self-pay | Admitting: Cardiovascular Disease

## 2019-01-24 ENCOUNTER — Other Ambulatory Visit (HOSPITAL_COMMUNITY): Payer: Self-pay | Admitting: Cardiology

## 2019-01-24 ENCOUNTER — Other Ambulatory Visit: Payer: Self-pay | Admitting: Medical

## 2019-01-24 NOTE — Telephone Encounter (Signed)
Patient calling back regarding this request.

## 2019-01-24 NOTE — Telephone Encounter (Signed)
Refill request

## 2019-01-25 ENCOUNTER — Emergency Department (HOSPITAL_BASED_OUTPATIENT_CLINIC_OR_DEPARTMENT_OTHER): Payer: Medicare Other

## 2019-01-25 ENCOUNTER — Encounter (HOSPITAL_BASED_OUTPATIENT_CLINIC_OR_DEPARTMENT_OTHER): Payer: Self-pay | Admitting: Adult Health

## 2019-01-25 ENCOUNTER — Emergency Department (HOSPITAL_BASED_OUTPATIENT_CLINIC_OR_DEPARTMENT_OTHER)
Admission: EM | Admit: 2019-01-25 | Discharge: 2019-01-25 | Disposition: A | Discharge status: Home / Self Care | Payer: Medicare Other | Attending: Emergency Medicine | Admitting: Emergency Medicine

## 2019-01-25 ENCOUNTER — Other Ambulatory Visit: Payer: Self-pay

## 2019-01-25 ENCOUNTER — Telehealth: Payer: Self-pay | Admitting: Medical

## 2019-01-25 ENCOUNTER — Ambulatory Visit: Payer: Medicare Other | Admitting: Medical

## 2019-01-25 DIAGNOSIS — I10 Essential (primary) hypertension: Secondary | ICD-10-CM | POA: Insufficient documentation

## 2019-01-25 DIAGNOSIS — E119 Type 2 diabetes mellitus without complications: Secondary | ICD-10-CM | POA: Insufficient documentation

## 2019-01-25 DIAGNOSIS — I2510 Atherosclerotic heart disease of native coronary artery without angina pectoris: Secondary | ICD-10-CM | POA: Diagnosis not present

## 2019-01-25 DIAGNOSIS — R0602 Shortness of breath: Secondary | ICD-10-CM | POA: Insufficient documentation

## 2019-01-25 DIAGNOSIS — Z79899 Other long term (current) drug therapy: Secondary | ICD-10-CM | POA: Diagnosis not present

## 2019-01-25 DIAGNOSIS — N289 Disorder of kidney and ureter, unspecified: Secondary | ICD-10-CM | POA: Insufficient documentation

## 2019-01-25 DIAGNOSIS — Z951 Presence of aortocoronary bypass graft: Secondary | ICD-10-CM | POA: Diagnosis not present

## 2019-01-25 DIAGNOSIS — Z7982 Long term (current) use of aspirin: Secondary | ICD-10-CM | POA: Diagnosis not present

## 2019-01-25 LAB — BASIC METABOLIC PANEL
Anion gap: 14 (ref 5–15)
BUN: 24 mg/dL — ABNORMAL HIGH (ref 8–23)
CO2: 26 mmol/L (ref 22–32)
Calcium: 9.6 mg/dL (ref 8.9–10.3)
Chloride: 98 mmol/L (ref 98–111)
Creatinine, Ser: 1.71 mg/dL — ABNORMAL HIGH (ref 0.44–1.00)
GFR calc Af Amer: 34 mL/min — ABNORMAL LOW (ref >60)
GFR calc non Af Amer: 29 mL/min — ABNORMAL LOW (ref >60)
Glucose, Bld: 267 mg/dL — ABNORMAL HIGH (ref 70–99)
Potassium: 3.6 mmol/L (ref 3.5–5.1)
Sodium: 138 mmol/L (ref 135–145)

## 2019-01-25 LAB — CBC
HCT: 35.2 % — ABNORMAL LOW (ref 36.0–46.0)
Hemoglobin: 11 g/dL — ABNORMAL LOW (ref 12.0–15.0)
MCH: 28 pg (ref 26.0–34.0)
MCHC: 31.3 g/dL (ref 30.0–36.0)
MCV: 89.6 fL (ref 80.0–100.0)
Platelets: 152 10*3/uL (ref 150–400)
RBC: 3.93 MIL/uL (ref 3.87–5.11)
RDW: 15.4 % (ref 11.5–15.5)
WBC: 6.8 10*3/uL (ref 4.0–10.5)
nRBC: 0 % (ref 0.0–0.2)

## 2019-01-25 LAB — PROTIME-INR
INR: 2.3 — ABNORMAL HIGH (ref 0.8–1.2)
Prothrombin Time: 25.2 seconds — ABNORMAL HIGH (ref 11.4–15.2)

## 2019-01-25 LAB — BRAIN NATRIURETIC PEPTIDE: B Natriuretic Peptide: 25.9 pg/mL (ref 0.0–100.0)

## 2019-01-25 MED ORDER — ALBUTEROL SULFATE HFA 108 (90 BASE) MCG/ACT IN AERS: 2.0000 | INHALATION_SPRAY | Freq: Four times a day (QID) | RESPIRATORY_TRACT | 0 refills | Status: DC | PRN

## 2019-01-25 MED ORDER — ALBUTEROL SULFATE HFA 108 (90 BASE) MCG/ACT IN AERS
2.0000 | INHALATION_SPRAY | Freq: Four times a day (QID) | RESPIRATORY_TRACT | Status: DC
  Administered 2019-01-25: 2 via RESPIRATORY_TRACT
  Filled 2019-01-25: qty 6.7

## 2019-01-25 NOTE — Telephone Encounter (Signed)
Advised to go to ED per Percell Miller. Pt states she doesn't want to be admitted but she will try to get there.

## 2019-01-25 NOTE — Discharge Instructions (Addendum)
Use the albuterol inhaler 2 puffs every 6 hours.  Call cardiology for follow-up.  Would drop back on your Demadex dose back to your normal amount before they increased it.  Renal function is showing that you are getting a little too dry.  Due to the diuretic.

## 2019-01-25 NOTE — ED Provider Notes (Signed)
[facility] EMERGENCY DEPARTMENT Provider Note   CSN: [id number] Arrival date & time: 01/25/19  1905     History   Chief Complaint Chief Complaint  Patient presents with  . Shortness of Breath    HPI Abigail Oliver is a 73 y.o. female.     Patient with persistent complaint of shortness of breath and fatigue for the past month.  Patient was evaluated here for this complaint August 19.  Had a slightly elevated BNP at that time.  Patient has since been seen by her cardiologist and had a work-up.  They did increase her Demadex.  But it has not made her feel any better.  When patient was here on August 18 she was given albuterol inhaler that only worked for a few days before it broke but that did make her feel better.  Patient a non-smoker.  But she is had years of secondhand smoke due to family members.  Patient is on Coumadin she is on Demadex she is on metformin and she is on Toprol XL.  Her cardiologist is Dr. [name].  Patient was sent in by her primary care doctor for concerns for her being over dehydrated.  Patient symptoms are no worse than they have been in the past.  Patient just wanted a renewal on her albuterol inhaler because it made her feel better.  Past medical history is significant for aortic stenosis has a mechanical aortic valve this reason why she is on the Coumadin.  She is known to have chronic diastolic congestive heart failure.  Known to have peripheral vascular disease.  And type 2 diabetes.  As stated at her cardiology visit they increased her Demadex from 3 tablets to 4 tablets.  No chest pain.     Past Medical History:  Diagnosis Date  . Anemia   . Anxiety   . Aortic stenosis    a. Now has St Jude mechanical aortic valve (6/00).b. Echo (6/15) with EF 60-65%, mechanical aortic valve with mean gradient 27 mmHg.  . Arthritis    a. Possible c-spine arthritis with pain down left arm.   . Carotid artery disease ([place])    a. Carotid dopplers (7/16) with  40-59% BICA stenosis.   . Chronic angle-closure glaucoma(365.23)   . Chronic diastolic CHF (congestive heart failure) ([place])    a.  RHC (7/15) with mean RA 2, PA 22/11, mean PCWP 9, CI 3.79.  Marland Kitchen Colon polyps   . Contrast media allergy   . Coronary atherosclerosis of native coronary artery    a. CABG at time of AVR in 6/00 with RIMA-RCA.b. Abnl nuc 11/2013 -> LHC (7/15) with patent RIMA-RCA, 80% mRCA, 80% pLAD with FFR 0.73, treated with DES to pLAD.  Marland Kitchen Depression   . Dizziness 02/09/2012   a. Holter (8/14) with few PACs, otherwise unremarkable.   . Elevated transaminase level   . Essential hypertension   . Gastritis   . GERD (gastroesophageal reflux disease)   . Hyperlipidemia   . Low back pain   . Mechanical heart valve present   . Neuromuscular disorder (H[facility])    neuropathy  . Peripheral vascular disease ([place])    a, H/o Right SFA stent. b. Peripheral arterial dopplers (7/16) with right SFA stent patent.   Marland Kitchen PONV (postoperative nausea and vomiting)    N&V  . Pulmonary nodules    a. Noted by CT in 6/15. CT chest (8/15) was thought to indicate that nodules were benign. No further workup was recommended.   Marland Kitchen  Syncope 03/29/2012   carotid doppler - R ICA 50-69% reduction by velocities (low end); L ICA 0-49% reduction by velocities (low end); R and L subclavian arteries - <50% redcution; R and L vertebral arteries show normal antegrade flow  . Type II diabetes mellitus [facility])     Patient Active Problem List   Diagnosis Date Noted  . Benign paroxysmal positional vertigo 07/14/2015  . Dizziness and giddiness 07/14/2015  . Diabetic polyneuropathy associated with type 2 diabetes mellitus ([place]) 07/14/2015  . Spinal stenosis, lumbar region, without neurogenic claudication 07/14/2015  . Cervical radiculopathy 07/14/2015  . Chest pain 07/07/2015  . Angina pectoris ([place]) 07/07/2015  . Dizziness 05/14/2015  . Left-sided weakness 02/23/2015  . Acute sinusitis 01/29/2015  . Cough 08/29/2014   . Cerumen impaction 03/28/2014  . S/P arterial stent-to LAD, Promus DES 12/14/13 12/15/2013  . Pulmonary nodules/lesions, multiple 11/19/2013  . Rotator cuff syndrome of left shoulder 05/28/2013  . Chronic diastolic heart failure ([place]) 01/02/2013  . Anemia, iron deficiency 11/05/2012  . Dyslipidemia 09/21/2011  . CAD, CABG 2000, low risk Myoview April 2011; 2015 + myoview 09/21/2011  . Diastolic dysfunction with NL LVF 2D August 2012 09/21/2011  . Claudication ([place]) 09/21/2011  . PVD, Rt SFA PTA/Stent 09/20/11 09/21/2011  . Nodule of neck 07/01/2011  . MIGRAINE, OPHTHALMIC 12/16/2009  . DEPRESSION, MAJOR, RECURRENT, MODERATE 12/15/2009  . OSTEOARTHRITIS, GENERALIZED, MULTIPLE JOINTS 12/30/2008  . Type II diabetes mellitus with peripheral autonomic neuropathy ([place]) 07/01/2008  . Chronic angle-closure glaucoma(365.23) 07/01/2008  . DISPLCMT LUMBAR INTERVERT [place] W/O MYELOPATHY 07/01/2008  . Calcaneal spur 07/01/2008  . OSTEOPOROSIS 07/01/2008    Past Surgical History:  Procedure Laterality Date  . BILATERAL OOPHORECTOMY  1987  . BREAST BIOPSY  2012   left  . CARDIAC CATHETERIZATION  01/14/1999   normal LV function, severe aortic stenosis; 80% and 70% stenosis in RCA; mild 20% distal norrowing in [place] with 20% proximal LAD stenosis, 40% diagonal stenosis and 20% proximal circumflex stenosis  . CARDIAC VALVE REPLACEMENT  2000   aortic valve   . CARDIOVASCULAR STRESS TEST  08/25/2011   R/L MV - EF 72%; no scintigraphic evidence of inducible MI; normal perfusionTID of 1.25 elevated - could indicate small vessle subendocardial ischemia; EKG NSR at 66, non diagnostic for ischemia  . [place]   bilaterally  . CATARACT EXTRACTION W/ INTRAOCULAR LENS  IMPLANT, BILATERAL  ~ 2007  . [place]; 1971  . CHOLECYSTECTOMY  1990  . COLONOSCOPY N/A 10/27/2012   Procedure: COLONOSCOPY;  Surgeon: Beryle Beams, MD;  Location: [facility] ENDOSCOPY;  Service: Endoscopy;   Laterality: N/A;  . CORONARY ARTERY BYPASS GRAFT  2000   RIMA to RCA  . DOPPLER ECHOCARDIOGRAPHY  03/29/2012   EF >55%; mild concentric LVH; stage 1 diastolic dysfunction, elevated LV filling pressure, dilated LA; MAC mild MR; St Jude AVR peak and mean gradients of 48mHg and 253mg; transvalvular gradients have increased (prev 23 and 14 respectively)  . ESOPHAGOGASTRODUODENOSCOPY N/A 10/27/2012   Procedure: ESOPHAGOGASTRODUODENOSCOPY (EGD);  Surgeon: PaBeryle BeamsMD;  [facility];  Service: Endoscopy;  Laterality: N/A;  . FEMORAL ARTERY STENT  09/20/2011   6 x 40 Smart Nitinol self-expanding stent placed;  10/15/2031 -R SFA stent open and patent w/o evidence of restenosis  . FRACTIONAL FLOW RESERVE WIRE  12/14/2013   Procedure: FRACTIONAL FLOW RESERVE WIRE;  Surgeon: DaLarey DresserMD;  [facility];  Service: Cardiovascular;;  .  LACERATION REPAIR     right hand  . LEFT AND RIGHT HEART CATHETERIZATION WITH CORONARY ANGIOGRAM N/A 12/14/2013   Procedure: LEFT AND RIGHT HEART CATHETERIZATION WITH CORONARY ANGIOGRAM;  Surgeon: Larey Dresser, MD;  Location: [facility] CATH LAB;  Service: Cardiovascular;  Laterality: N/A;  . LOWER EXTREMITY ANGIOGRAM N/A 09/20/2011   Procedure: LOWER EXTREMITY ANGIOGRAM;  Surgeon: Lorretta Harp, MD;  Location: [facility] C[facility] LAB;  Service: Cardiovascular;  Laterality: N/A;  . LYMPH NODE BIOPSY  06/2011   "core needle on 5"  . needle biopsy  2009   "on ankles for nerve damage"  . NEUROPLASTY / TRANSPOSITION ULNAR NERVE AT ELBOW     right  . PERCUTANEOUS CORONARY STENT INTERVENTION (PCI-S)  12/14/2013   Procedure: PERCUTANEOUS CORONARY STENT INTERVENTION (PCI-S);  Surgeon: Larey Dresser, MD;  Location: [facility] CATH LAB;  Service: Cardiovascular;;  Prox LAD 3.00x12 Promus DES   . PERIPHERAL ARTERIAL STENT GRAFT  09/20/11   right SFA  . REFRACTIVE SURGERY  ` 2004   "for glaucoma"  . TONSILLECTOMY  1968  . TRIGGER FINGER RELEASE Left 12/04/2015   Procedure:  LEFT RING FINGER TRIGGER RELEASE;  Surgeon: Leanora Cover, MD;  Location: [facility];  Service: Orthopedics;  Laterality: Left;  Marland Kitchen VAGINAL HYSTERECTOMY  1985   Fibroids     OB History   No obstetric history on file.      Home Medications    Prior to Admission medications   Medication Sig Start Date End Date Taking? Authorizing Provider  albuterol (VENTOLIN HFA) 108 (90 Base) MCG/ACT inhaler Inhale 2 puffs into the lungs every 6 (six) hours as needed for wheezing or shortness of breath. 01/25/19   Saguier, Percell Miller, PA-C  aspirin EC 81 MG tablet Take 1 tablet (81 mg total) by mouth daily. 02/21/15   Larey Dresser, MD  busPIRone (BUSPAR) 7.5 MG tablet Take 7.5 mg by mouth 3 (three) times daily as needed.    [provider]  calcium-vitamin D (CALCIUM 500+D) 500-200 MG-UNIT per tablet Take 1 tablet by mouth 2 (two) times daily.     [provider]  ferrous sulfate 325 (65 FE) MG tablet Take 1 tablet (325 mg total) by mouth daily. 10/28/13   Pattricia Boss, MD  folic acid (FOLVITE) 1 MG tablet Take 1 mg by mouth daily.    [provider]  Icosapent Ethyl 1 g CAPS Take 2 capsules (2 g total) by mouth 2 (two) times daily. 01/18/19   Larey Dresser, MD  latanoprost (XALATAN) 0.005 % ophthalmic solution PLACE 1 DROP INTO BOTH EYES AT BEDTIME. 07/25/14   Hoyt Koch, MD  metFORMIN (GLUCOPHAGE) 1000 MG tablet Take 1,000 mg by mouth daily with breakfast.  10/31/18   [provider]  metoprolol succinate (TOPROL-XL) 50 MG 24 hr tablet TAKE ONE TABLET BY MOUTH DAILY 01/22/19   Larey Dresser, MD  pantoprazole (PROTONIX) 40 MG tablet TAKE ONE TABLET BY MOUTH DAILY 10/27/18   Saguier, Percell Miller, PA-C  potassium chloride SA (K-DUR) 20 MEQ tablet Take 20 mEq by mouth 3 (three) times daily.    [provider]  rosuvastatin (CRESTOR) 20 MG tablet Take 1 tablet (20 mg total) by mouth daily. 12/30/16   Bensimhon, Shaune Pascal, MD  torsemide (DEMADEX) 20 MG  tablet TAKE THREE TABLETS BY MOUTH EVERY MORNING AND TAKE TWO TABLETS BY MOUTH EVERY EVENING 01/22/19   Larey Dresser, MD  traZODone (DESYREL) 50 MG tablet TAKE 1/2 TO  1 TABLET BY MOUTH EVERY NIGHT AT BEDTIME AS NEEDED FOR SLEEP 01/24/19   Saguier, Percell Miller, PA-C  warfarin (COUMADIN) 3 MG tablet TAKE 1/2 TO 1 TABLET BY MOUTH DAILY AS DIRECTED 01/24/19   Larey Dresser, MD    Family History Family History  Problem Relation Age of Onset  . Colon cancer Mother   . COPD Mother   . Emphysema Mother   . Cancer Maternal Grandmother   . Cancer Maternal Grandfather   . Heart disease Brother   . Diabetes Neg Hx     Social History Social History   Tobacco Use  . Smoking status: Never Smoker  . Smokeless tobacco: Never Used  Substance Use Topics  . Alcohol use: No  . Drug use: No     Allergies   Atorvastatin, Simvastatin, Sulfa antibiotics, and Iodinated diagnostic agents   Review of Systems Review of Systems  Constitutional: Negative for chills and fever.  HENT: Negative for congestion, rhinorrhea and sore throat.   Eyes: Negative for visual disturbance.  Respiratory: Positive for shortness of breath. Negative for cough.   Cardiovascular: Negative for chest pain and leg swelling.  Gastrointestinal: Negative for abdominal pain, diarrhea, nausea and vomiting.  Genitourinary: Negative for dysuria.  Musculoskeletal: Negative for back pain and neck pain.  Skin: Negative for rash.  Neurological: Negative for dizziness, light-headedness and headaches.  Hematological: Does not bruise/bleed easily.  Psychiatric/Behavioral: Negative for confusion.     Physical Exam Updated Vital Signs BP (!) 115/59   Pulse 89   Temp 98.8 F (37.1 C) (Oral)   Resp 17   Ht 1.575 m (_0 )   Wt 80.3 kg   SpO2 96%   BMI 32.37 kg/m   Physical Exam Vitals signs and nursing note reviewed.  Constitutional:      General: She is not in acute distress.    Appearance: Normal appearance. She is  well-developed.  HENT:     Head: Normocephalic and atraumatic.  Eyes:     Extraocular Movements: Extraocular movements intact.     Conjunctiva/sclera: Conjunctivae normal.     Pupils: Pupils are equal, round, and reactive to light.  Neck:     Musculoskeletal: Normal range of motion and neck supple.  Cardiovascular:     Rate and Rhythm: Normal rate and regular rhythm.     Heart sounds: No murmur.  Pulmonary:     Effort: Pulmonary effort is normal. No respiratory distress.     Breath sounds: Normal breath sounds.  Abdominal:     Palpations: Abdomen is soft.     Tenderness: There is no abdominal tenderness.  Musculoskeletal:        General: Swelling present.  Skin:    General: Skin is warm and dry.  Neurological:     General: No focal deficit present.     Mental Status: She is alert and oriented to person, place, and time.      ED Treatments / Results  Labs (all labs ordered are listed, but only abnormal results are displayed) Labs Reviewed  CBC - Abnormal; Notable for the following components:      Result Value   Hemoglobin 11.0 (*)    HCT 35.2 (*)    All other components within normal limits  BASIC METABOLIC PANEL - Abnormal; Notable for the following components:   Glucose, Bld 267 (*)    BUN 24 (*)    Creatinine, Ser 1.71 (*)    GFR calc non Af Amer 29 (*)  GFR calc Af Amer 34 (*)    All other components within normal limits  PROTIME-INR - Abnormal; Notable for the following components:   Prothrombin Time 25.2 (*)    INR 2.3 (*)    All other components within normal limits  BRAIN NATRIURETIC PEPTIDE    EKG EKG Interpretation  Date/Time:  Thursday January 25 2019 19:20:23 EDT Ventricular Rate:  89 PR Interval:  156 QRS Duration: 74 QT Interval:  386 QTC Calculation: 469 R Axis:   67 Text Interpretation:  Normal sinus rhythm Cannot rule out Anterior infarct , age undetermined Abnormal ECG Confirmed by Fredia Sorrow [phone]) on 01/25/2019 8:29:24 PM    Radiology Dg Chest 2 View  Result Date: 01/25/2019 CLINICAL DATA:  Shortness of breath. EXAM: CHEST - 2 VIEW COMPARISON:  01/10/2019 FINDINGS: The heart size and mediastinal contours are within normal limits. Both lungs are clear. The visualized skeletal structures are unremarkable. IMPRESSION: No active cardiopulmonary disease. Electronically Signed   By: Constance Holster M.D.   On: 01/25/2019 20:22    Procedures Procedures (including critical care time)  Medications Ordered in ED Medications  albuterol (VENTOLIN HFA) 108 (90 Base) MCG/ACT inhaler 2 puff (2 puffs Inhalation Given 01/25/19 2053)     Initial Impression / Assessment and Plan / ED Course  I have reviewed the triage vital signs and the nursing notes.  Pertinent labs & imaging results that were available during my care of the patient were reviewed by me and considered in my medical decision making (see chart for details).        Patient's work-up here shows that BNP is markedly reduced.  Renal function is slightly low worse.  Feel the patient is getting too much of the diuretic.  We will have her cut back to her normal dose and have her follow back up with cardiology.  Patient does not seem to need IV fluids here.  Patient breathing felt much better with albuterol inhaler and that will be given to her to go home with and have her continue to use that as needed.  Chest x-ray without any evidence of pulmonary edema.  Final Clinical Impressions(s) / ED Diagnoses   Final diagnoses:  SOB (shortness of breath)  Renal insufficiency    ED Discharge Orders    None       Fredia Sorrow, MD 01/25/19 2231

## 2019-01-25 NOTE — ED Triage Notes (Signed)
Presents with SOB, fatigue, and mild peripheral swelling that began over a month ago. Worse with exertion. She reprots that her MD told her to come here tonight because of SOB.

## 2019-01-25 NOTE — Telephone Encounter (Signed)
I talked with Dr. Etter Sjogren about signs and symptoms presently. Unfortunately we can't see her due to her symptoms. No way to know if has chf, reactive airways, or covid.  Sorry may just have chf but we have to follow cone guidelines. Recommend ED evaluation.  If determined she has chf flare again and they treat and discharge then recommend calling tomorrow and get scheduled for virtual follow up with me on Tuesday.

## 2019-01-25 NOTE — Telephone Encounter (Signed)
Rx albuterol sent to pt pharmacy. But ask pt how often she is using and does it help. She has hx of heart failure and need to know what is going on. Offer virtual visit before weekend if significantly sob.

## 2019-01-25 NOTE — Telephone Encounter (Signed)
Pt is requesting refill on albuterol inhaler that was giving by ED. Please advise.

## 2019-01-26 ENCOUNTER — Telehealth: Payer: Self-pay | Admitting: Medical

## 2019-01-26 NOTE — Telephone Encounter (Signed)
Reviewed ED note. Can you get her scheduled for virt visit. Next Tuesday or wed for follow up.

## 2019-01-30 ENCOUNTER — Ambulatory Visit (HOSPITAL_COMMUNITY)
Admission: RE | Admit: 2019-01-30 | Discharge: 2019-01-30 | Disposition: A | Discharge status: Home / Self Care | Payer: Medicare Other | Source: Ambulatory Visit | Attending: Cardiology | Admitting: Cardiology

## 2019-01-30 ENCOUNTER — Other Ambulatory Visit: Payer: Self-pay

## 2019-01-30 ENCOUNTER — Ambulatory Visit (HOSPITAL_COMMUNITY)
Admission: RE | Admit: 2019-01-30 | Discharge: 2019-01-30 | Disposition: A | Discharge status: Home / Self Care | Payer: Medicare Other | Source: Ambulatory Visit | Attending: Internal Medicine | Admitting: Internal Medicine

## 2019-01-30 DIAGNOSIS — I5032 Chronic diastolic (congestive) heart failure: Secondary | ICD-10-CM | POA: Diagnosis not present

## 2019-01-30 DIAGNOSIS — I351 Nonrheumatic aortic (valve) insufficiency: Secondary | ICD-10-CM | POA: Insufficient documentation

## 2019-01-30 DIAGNOSIS — Z952 Presence of prosthetic heart valve: Secondary | ICD-10-CM | POA: Insufficient documentation

## 2019-01-30 DIAGNOSIS — I110 Hypertensive heart disease with heart failure: Secondary | ICD-10-CM | POA: Insufficient documentation

## 2019-01-30 DIAGNOSIS — I2510 Atherosclerotic heart disease of native coronary artery without angina pectoris: Secondary | ICD-10-CM | POA: Diagnosis not present

## 2019-01-30 DIAGNOSIS — E785 Hyperlipidemia, unspecified: Secondary | ICD-10-CM | POA: Insufficient documentation

## 2019-01-30 DIAGNOSIS — E119 Type 2 diabetes mellitus without complications: Secondary | ICD-10-CM | POA: Insufficient documentation

## 2019-01-30 DIAGNOSIS — I509 Heart failure, unspecified: Secondary | ICD-10-CM | POA: Insufficient documentation

## 2019-01-30 DIAGNOSIS — I313 Pericardial effusion (noninflammatory): Secondary | ICD-10-CM | POA: Diagnosis not present

## 2019-01-30 LAB — BASIC METABOLIC PANEL
Anion gap: 12 (ref 5–15)
BUN: 22 mg/dL (ref 8–23)
CO2: 27 mmol/L (ref 22–32)
Calcium: 9.5 mg/dL (ref 8.9–10.3)
Chloride: 99 mmol/L (ref 98–111)
Creatinine, Ser: 1.46 mg/dL — ABNORMAL HIGH (ref 0.44–1.00)
GFR calc Af Amer: 41 mL/min — ABNORMAL LOW (ref >60)
GFR calc non Af Amer: 35 mL/min — ABNORMAL LOW (ref >60)
Glucose, Bld: 248 mg/dL — ABNORMAL HIGH (ref 70–99)
Potassium: 4 mmol/L (ref 3.5–5.1)
Sodium: 138 mmol/L (ref 135–145)

## 2019-01-30 NOTE — Progress Notes (Signed)
  Echocardiogram 2D Echocardiogram has been performed.  Abigail Oliver 01/30/2019, 10:25 AM

## 2019-02-02 ENCOUNTER — Telehealth (HOSPITAL_COMMUNITY): Payer: Self-pay

## 2019-02-02 DIAGNOSIS — I739 Peripheral vascular disease, unspecified: Secondary | ICD-10-CM

## 2019-02-02 NOTE — Telephone Encounter (Signed)
-----   Message from Larey Dresser, MD sent at 01/23/2019  4:20 PM EDT ----- Moderate right popliteal stenosis, some progression. Please refer to PV (Dr. Gwenlyn Found) for routine PV followup.

## 2019-02-02 NOTE — Telephone Encounter (Signed)
-----   Message from Larey Dresser, MD sent at 01/23/2019  4:21 PM EDT ----- Mild bilateral ICA stenosis.

## 2019-02-02 NOTE — Telephone Encounter (Signed)
Pt aware of results of LE duplex. Referred to Dr Gwenlyn Found. Advised his office will call her for an appointment. Verbalized understanding.   Pt aware of carotid study results and appreciative.

## 2019-02-05 ENCOUNTER — Ambulatory Visit (INDEPENDENT_AMBULATORY_CARE_PROVIDER_SITE_OTHER): Payer: Medicare Other | Admitting: Pharmacist Clinician (PhC)/ Clinical Pharmacy Specialist

## 2019-02-05 ENCOUNTER — Other Ambulatory Visit: Payer: Self-pay

## 2019-02-05 DIAGNOSIS — Z7901 Long term (current) use of anticoagulants: Secondary | ICD-10-CM | POA: Diagnosis not present

## 2019-02-05 DIAGNOSIS — Z952 Presence of prosthetic heart valve: Secondary | ICD-10-CM | POA: Diagnosis not present

## 2019-02-05 LAB — POCT INR: INR: 3.9 — AB (ref 2.0–3.0)

## 2019-02-07 ENCOUNTER — Other Ambulatory Visit: Payer: Self-pay

## 2019-02-07 ENCOUNTER — Encounter (HOSPITAL_COMMUNITY): Payer: Self-pay

## 2019-02-07 ENCOUNTER — Ambulatory Visit (HOSPITAL_COMMUNITY)
Admission: RE | Admit: 2019-02-07 | Discharge: 2019-02-07 | Disposition: A | Discharge status: Home / Self Care | Payer: Medicare Other | Source: Ambulatory Visit | Attending: Cardiology | Admitting: Cardiology

## 2019-02-07 VITALS — BP 130/62 | HR 70 | Wt 185.0 lb

## 2019-02-07 DIAGNOSIS — G4733 Obstructive sleep apnea (adult) (pediatric): Secondary | ICD-10-CM | POA: Diagnosis not present

## 2019-02-07 DIAGNOSIS — Z8719 Personal history of other diseases of the digestive system: Secondary | ICD-10-CM | POA: Insufficient documentation

## 2019-02-07 DIAGNOSIS — I110 Hypertensive heart disease with heart failure: Secondary | ICD-10-CM | POA: Diagnosis not present

## 2019-02-07 DIAGNOSIS — I5032 Chronic diastolic (congestive) heart failure: Secondary | ICD-10-CM | POA: Diagnosis not present

## 2019-02-07 DIAGNOSIS — Z952 Presence of prosthetic heart valve: Secondary | ICD-10-CM | POA: Insufficient documentation

## 2019-02-07 DIAGNOSIS — I350 Nonrheumatic aortic (valve) stenosis: Secondary | ICD-10-CM | POA: Insufficient documentation

## 2019-02-07 DIAGNOSIS — R0602 Shortness of breath: Secondary | ICD-10-CM | POA: Diagnosis not present

## 2019-02-07 DIAGNOSIS — Z951 Presence of aortocoronary bypass graft: Secondary | ICD-10-CM | POA: Diagnosis not present

## 2019-02-07 DIAGNOSIS — Z7982 Long term (current) use of aspirin: Secondary | ICD-10-CM | POA: Insufficient documentation

## 2019-02-07 DIAGNOSIS — Z8249 Family history of ischemic heart disease and other diseases of the circulatory system: Secondary | ICD-10-CM | POA: Insufficient documentation

## 2019-02-07 DIAGNOSIS — Z79899 Other long term (current) drug therapy: Secondary | ICD-10-CM | POA: Diagnosis not present

## 2019-02-07 DIAGNOSIS — I25810 Atherosclerosis of coronary artery bypass graft(s) without angina pectoris: Secondary | ICD-10-CM | POA: Insufficient documentation

## 2019-02-07 DIAGNOSIS — Z7901 Long term (current) use of anticoagulants: Secondary | ICD-10-CM | POA: Insufficient documentation

## 2019-02-07 DIAGNOSIS — I739 Peripheral vascular disease, unspecified: Secondary | ICD-10-CM | POA: Diagnosis not present

## 2019-02-07 DIAGNOSIS — Z91041 Radiographic dye allergy status: Secondary | ICD-10-CM | POA: Diagnosis not present

## 2019-02-07 DIAGNOSIS — E785 Hyperlipidemia, unspecified: Secondary | ICD-10-CM | POA: Insufficient documentation

## 2019-02-07 DIAGNOSIS — I6521 Occlusion and stenosis of right carotid artery: Secondary | ICD-10-CM | POA: Diagnosis not present

## 2019-02-07 DIAGNOSIS — R42 Dizziness and giddiness: Secondary | ICD-10-CM | POA: Diagnosis not present

## 2019-02-07 DIAGNOSIS — Z7984 Long term (current) use of oral hypoglycemic drugs: Secondary | ICD-10-CM | POA: Diagnosis not present

## 2019-02-07 DIAGNOSIS — E119 Type 2 diabetes mellitus without complications: Secondary | ICD-10-CM | POA: Diagnosis not present

## 2019-02-07 DIAGNOSIS — K219 Gastro-esophageal reflux disease without esophagitis: Secondary | ICD-10-CM | POA: Diagnosis not present

## 2019-02-07 DIAGNOSIS — M791 Myalgia, unspecified site: Secondary | ICD-10-CM | POA: Insufficient documentation

## 2019-02-07 NOTE — Progress Notes (Signed)
ReDS Vest - 02/07/19 1041      ReDS Vest   Estimated volume prior to reading  Med    Fitting Posture  Sitting    Height Marker  Short    Ruler Value  28    Center Strip  Aligned    ReDS Value  32    Anatomical Comments  station A

## 2019-02-07 NOTE — Patient Instructions (Signed)
Follow up with the [facility] in 3 months.  At the [facility], you and your health needs are our priority. As part of our continuing mission to provide you with exceptional heart care, we have created designated Provider Care Teams. These Care Teams include your primary Cardiologist (physician) and Advanced Practice Providers (APPs- Physician Assistants and Nurse Practitioners) who all work together to provide you with the care you need, when you need it.   You may see any of the following providers on your designated Care Team at your next follow up: Marland Kitchen Dr Glori Bickers . Dr Loralie Champagne . Darrick Grinder, NP   Please be sure to bring in all your medications bottles to every appointment.

## 2019-02-07 NOTE — Progress Notes (Signed)
Patient ID: Abigail Oliver, female   DOB: [birth year], 73 y.o.   MRN: [id number] PCP: Dr. Sharlet Salina HF Cardiology: Dr. [name]  73 y.o. yo with history of aortic stenosis s/p St Jude mechanical AVR (6/00) and CAD s/p single vessel CABG (6/00) presents for cardiology evaluation.  She had severe aortic stenosis and therefore required CABG-AVR in 6/00.  She had a RIMA-RCA. She had a myoview in 4/13 with no ischemia or infarction, and echo in 6/14 showed normal EF with moderate diastolic dysfunction and a well-seated mechanical valve.    In 6/15, torsemide was stopped and she was begun on Lasix due to increased cost of torsemide.  She developed dyspnea and lower extremity edema.  She was admitted later that month with community-acquired PNA as well as CHF.  CT showed RLL PNA and multiple pulmonary nodules.  Echo in 6/15 showed EF 60-65%, mechanical aortic valve with mean gradient 27 mmHg. After discharge, she continued to be short of breath.  Lasix was increased to 80 mg bid.  This did not help much.  She was short of breath walking around in her yard.  No chest pain.  Lexiscan Cardiolite was done in 7/15 given the exertional symptoms and showed a small area of basal inferolateral ischemia.   Given ongoing exertional symptoms, I did a right and left heart catheterization in 7/15.  Right and left heart filling pressures were normal.  There was an 80% stenosis in the proximal LAD that was hemodynamically significant by FFR. Patient had DES to proximal LAD.    She has BPPV and was referred by neurology to vestibular rehab.    Today she returns for HF follow up. Last visit torsemide was increased to 40 mg daily. She went to ED on 9/3 with increased shortness of breath. Given breathing treatment and discharged home. She cut her torsemide cut back to 40 mg daily and has felt a little better. .   Today she returns for HF follow up with her husband.. Overall feeling ok. She remains SOB with exertion. Rarely having  chest discomfort.  Denies PND/Orthopnea. Appetite ok. No fever or chills. Weight at home 178-180 pounds. Taking all medications and has cut back her torsemide to 40 mg at bed time.   Labs (6/14): BNP 216, HCT 32, K 4.8, creatinine 0.8 Labs (8/14): K 4.4, creatinine 0.8, BNP 55, HDL 45, LDL 141 Labs (10/14): LDL 61, HDL 43, AST 40, ALT 31 Labs (6/15): BNP 37 Labs (7/15): K 4.4, creatinine 0.8 => 0.77 Labs (9/15): HCT 34.5, LDL 32, HDL 40 Labs (4/16): K 3.8, creatinine 0.96 Labs (5/16): K 4.4, creatinine 1.02, LDL 138 Labs (6/16): K 4.3, creatinine 1.34, BNP 83 Labs (9/16): TSH normal Labs (12/16): K 3.8, creatinine 1.05, BNP 87 Labs (2/17): BNP 35, K 4.6, creatinine 1.18, HCT 34.9 Labs (8/20): K 4, creatinine 1.4, BNP 280, hgb 10.3  PMH: 1. Aortic stenosis: Now has St Jude mechanical aortic valve (6/00).  Echo (6/14) with EF 60-65%, mild LVH, moderate diastolic dysfunction, normal RV, mechanical aortic valve with mean gradient 20 mmHg.  Echo (6/15) with EF 60-65%, mechanical aortic valve with mean gradient 27 mmHg.  - Mean gradient across mechanical AoV in 7/18 was 17 mmHg (lower).  2. CAD: CABG at time of AVR in 6/00 with RIMA-RCA.  Myoview in 4/13 with EF 72%, no ischemia or infarction. Lexiscan Cardiolite (7/15) with EF 69%, small area of basal inferolateral ischemia.  LHC (7/15) with patent RIMA-RCA, 80% mRCA, 80% pLAD with  FFR 0.73, treated with DES to pLAD.  - Cardiolite (3/17): Normal.  3. HTN 4. Type II diabetes 5. GERD 6. OSA 7. Hyperlipidemia 8. PAD: Right SFA stent. Peripheral arterial dopplers (7/15) with patent right SFA stent. Peripheral arterial dopplers (7/16) with right SFA stent patent.  9. Colon polyps 10. Carotid stenosis: Carotid dopplers (11/13) witih 50-69% RICA.  Carotid dopplers (7/15) with 40-59% RICA. Carotid dopplers (7/16) with 40-59% BICA stenosis.  - Carotid dopplers (7/18): 40-59% RICA stenosis -Carotid Doppler 9/202011: RICA 40-59%  12. Gastritis 13.  Dizziness: Holter (8/14) with few PACs, otherwise unremarkable.  Event monitor (2/17) with no significant arrhythmias.  Dizziness is probably due to BPPV.  14. Mild transaminase elevation 15. Pulmonary nodules: Noted by CT in 6/15.  CT chest (8/15) was thought to indicate that nodules were benign.  No further workup was recommended.  16. Contrast allergy 17. Diastolic CHF: RHC (123456) with mean RA 2, PA 22/11, mean PCWP 9, CI 3.79 - Echo (7/18): EF 65-70%, mechanical aortic valve with mean gradient 17 mmHg, mild MR.  -ECHO 01/2019 EF 60-65%.  18. Low back pain. 19. Possible c-spine arthritis with pain down left arm.    SH: Married, works as Theme park manager, nonsmoker.   FH: CAD  ROS: All systems reviewed and negative except as per HPI.   Current Outpatient Medications  Medication Sig Dispense Refill  . albuterol (VENTOLIN HFA) 108 (90 Base) MCG/ACT inhaler Inhale 2 puffs into the lungs every 6 (six) hours as needed for wheezing or shortness of breath. 18 g 0  . aspirin EC 81 MG tablet Take 1 tablet (81 mg total) by mouth daily.    . busPIRone (BUSPAR) 7.5 MG tablet Take 7.5 mg by mouth 3 (three) times daily as needed.    . calcium-vitamin D (CALCIUM 500+D) 500-200 MG-UNIT per tablet Take 1 tablet by mouth 2 (two) times daily.     . ferrous sulfate 325 (65 FE) MG tablet Take 1 tablet (325 mg total) by mouth daily. 30 tablet 0  . folic acid (FOLVITE) 1 MG tablet Take 1 mg by mouth daily.    Vanessa Kick Ethyl 1 g CAPS Take 2 capsules (2 g total) by mouth 2 (two) times daily. 120 capsule 6  . latanoprost (XALATAN) 0.005 % ophthalmic solution PLACE 1 DROP INTO BOTH EYES AT BEDTIME. 2.5 mL 2  . metFORMIN (GLUCOPHAGE) 1000 MG tablet Take 1,000 mg by mouth daily with breakfast.     . metoprolol succinate (TOPROL-XL) 50 MG 24 hr tablet TAKE ONE TABLET BY MOUTH DAILY 90 tablet 0  . pantoprazole (PROTONIX) 40 MG tablet TAKE ONE TABLET BY MOUTH DAILY 90 tablet 10  . potassium chloride SA (K-DUR) 20 MEQ  tablet Take 20 mEq by mouth 3 (three) times daily.    . rosuvastatin (CRESTOR) 20 MG tablet Take 1 tablet (20 mg total) by mouth daily. 90 tablet 3  . torsemide (DEMADEX) 20 MG tablet TAKE THREE TABLETS BY MOUTH EVERY MORNING AND TAKE TWO TABLETS BY MOUTH EVERY EVENING 450 tablet 0  . traZODone (DESYREL) 50 MG tablet TAKE 1/2 TO 1 TABLET BY MOUTH EVERY NIGHT AT BEDTIME AS NEEDED FOR SLEEP 16 tablet 0  . warfarin (COUMADIN) 3 MG tablet TAKE 1/2 TO 1 TABLET BY MOUTH DAILY AS DIRECTED 70 tablet 0   No current facility-administered medications for this encounter.     BP 130/62   Pulse 70   Wt 83.9 kg (185 lb)   SpO2 97%   BMI  33.84 kg/m   Wt Readings from Last 3 Encounters:  02/07/19 83.9 kg (185 lb)  01/25/19 80.3 kg (177 lb)  01/17/19 80.6 kg (177 lb 9.6 oz)   ReDS Vest - 02/07/19 1041      ReDS Vest   Estimated volume prior to reading  Med    Fitting Posture  Sitting    Height Marker  Short    Ruler Value  28    Center Strip  Aligned    ReDS Value  32    Anatomical Comments  station A       General:  No resp difficulty.  HEENT: normal Neck: supple. no JVD. Carotids 2+ bilat; no bruits. No lymphadenopathy or thryomegaly appreciated. Cor: PMI nondisplaced. Regular rate & rhythm. No rubs, gallops. RUSB 2/6 . Lungs: clear Abdomen: soft, nontender, nondistended. No hepatosplenomegaly. No bruits or masses. Good bowel sounds. Extremities: no cyanosis, clubbing, rash, edema Neuro: alert & orientedx3, cranial nerves grossly intact. moves all 4 extremities w/o difficulty. Affect pleasant  Assessment/Plan: 1. St Jude mechanical aortic valve:  Mean gradient across the aortic valve has been chronically elevated, last mean gradient in 7/18 by echo was lower at 17 mmHg.   -01/2019  ECHO with normal mechanical aortic valve.  - Continue ASA 81 + warfarin.  - She will need endocarditis prophylaxis with dental work.    2. Chronic diastolic CHF: ECHO [id number] EF normal.  NYHA III. Volume  status stable. Continue torsemide 40 mg daily.  Reds Clip low. So will continue current dose.  3. CAD: s/p RIMA-RCA at time of AVR.  She developed exertional dyspnea and had cath showing significant proximal LAD stenosis in 7/15 that was treated with DES.  3/17 Cardiolite was normal.   Rarely having chest pain. We discussed possible cardiolite versus cath if she has recurrent chest pain. She does want cardiolite again but if needed would consider LHC>   - Given mechanical valve, She is on regimen of ASA 81 daily + warfarin.  - Continue statin.   4. Hyperlipidemia: Myalgias with atorvastatin and simvastatin.  She is tolerating Crestor 20 mg daily.  5. Carotid stenosis: Carotid dopplers stable 01/23/2019 6. PAD: Known PAD with right SFA stent.  -01/2019  ABI with moderate popliteal stenosis. Referred back to Dr Gwenlyn Found. I will arrange for peripheral vascular dopplers.  7. "Dizziness:"  Likely due to BPPV.  Not as prominent as in the past.    Follow up with Dr [name] in 3 months.   Amy Clegg 02/07/2019

## 2019-02-09 ENCOUNTER — Other Ambulatory Visit: Payer: Self-pay

## 2019-02-09 ENCOUNTER — Ambulatory Visit (INDEPENDENT_AMBULATORY_CARE_PROVIDER_SITE_OTHER): Payer: Medicare Other | Admitting: Medical

## 2019-02-09 VITALS — HR 74 | Wt 182.0 lb

## 2019-02-09 DIAGNOSIS — I509 Heart failure, unspecified: Secondary | ICD-10-CM

## 2019-02-09 DIAGNOSIS — F329 Major depressive disorder, single episode, unspecified: Secondary | ICD-10-CM | POA: Diagnosis not present

## 2019-02-09 DIAGNOSIS — R059 Cough, unspecified: Secondary | ICD-10-CM

## 2019-02-09 DIAGNOSIS — R05 Cough: Secondary | ICD-10-CM | POA: Diagnosis not present

## 2019-02-09 DIAGNOSIS — I6523 Occlusion and stenosis of bilateral carotid arteries: Secondary | ICD-10-CM

## 2019-02-09 DIAGNOSIS — F32A Depression, unspecified: Secondary | ICD-10-CM

## 2019-02-09 MED ORDER — VENLAFAXINE HCL ER 75 MG PO CP24: 75.0000 mg | ORAL_CAPSULE | Freq: Every day | ORAL | 0 refills | Status: DC

## 2019-02-09 MED ORDER — BUSPIRONE HCL 7.5 MG PO TABS: ORAL_TABLET | ORAL | 0 refills | Status: DC

## 2019-02-09 NOTE — Progress Notes (Signed)
Subjective:    Patient ID: Abigail Oliver, female    DOB: [birth year], 73 y.o.   MRN: [id number]  HPI  Virtual Visit via Video Note  I connected with [name] on 02/09/19 at  3:00 PM EDT by a video enabled telemedicine application and verified that I am speaking with the correct person using two identifiers.  Location: Patient: home Provider: office   I discussed the limitations of evaluation and management by telemedicine and the availability of in person appointments. The patient expressed understanding and agreed to proceed.  History of Present Illness: Pt in for follow up.  Pt states she had visit with ED for possible  Chf. 01-25-2019 She was told also was dehydrated.  In ED  " Patient's work-up here shows that BNP is markedly reduced. Renal function is slightly low worse.  Feel the patient is getting too much of the diuretic.  We will have her cut back to her normal dose and have her follow back up with cardiology.  Patient does not seem to need IV fluids here.  Patient breathing felt much better with albuterol inhaler and that will be given to her to go home with and have her continue to use that as needed.  Chest x-ray without any evidence of pulmonary edema."  Pt followed up with cardiology 2 days ago and they told her she was not dehydrated.   Pt has baseline poor kidney functions.  Pt was told by cardiologist PA just 2 days ago told her to take 2 tab of 40 mg daily. Pt states this is what she has been doing for years.  Pt is using albuterol 2 puffs every 6 hours. Pt states checked for copd in past but tests were negative. Pt o2 sat have been 98% recently.  Depressed and sad about medical conditions. Very worn down emotionally. She states formerly on effexor. She thinks may have helped but it ran out and she never asked for refills. She thought it was meant only for short course and not aware I wanted her to be on longer period.   Pt has been anxious and is on  buspar. She is on trazadone for insomnia.  Cough chronic and intermittnet with chf. No fever or other associated infections signs/symptoms recently.     Observations/Objective: General- no acute distress, pleasant, oriented. But she does express frustrated about her quality of life and med conditions.  Assessment and Plan: For depression and anxiety, I am refilling your effexor at 75 mg dose to take daily and refilling your buspar.   Your cough is likely chf and reactive airway associated. But with covid restrictions I want you tested at drive thru center on Monday. Then if test negative can double check and try to make arrangements for in office visit. Stay on current lasix and continue albuterol inhaler. Check o2 sats daily. If o2 sat less than 94% let me know. Any severe signs or symptoms then ED evaluation.  If able to see next week will get cxr, cmp and bnp. Assess if dehydrated or if leaning toward chf flare.  Follow approximate middle of next week or as needed.  [name], PA-C  Follow Up Instructions:    I discussed the assessment and treatment plan with the patient. The patient was provided an opportunity to ask questions and all were answered. The patient agreed with the plan and demonstrated an understanding of the instructions.   The patient was advised to call back or seek an  in-person evaluation if the symptoms worsen or if the condition fails to improve as anticipated.  I provided 25  minutes of non-face-to-face time during this encounter.   [name], PA-C   Review of Systems  Constitutional: Negative for chills, fatigue and fever.  HENT: Negative for congestion.   Respiratory: Positive for cough. Negative for chest tightness, shortness of breath and wheezing.   Cardiovascular: Negative for chest pain and palpitations.  Gastrointestinal: Negative for abdominal pain, blood in stool, diarrhea, nausea and vomiting.  Musculoskeletal: Negative for back  pain.  Skin: Negative for rash.  Neurological: Negative for dizziness and headaches.  Hematological: Negative for adenopathy. Does not bruise/bleed easily.  Psychiatric/Behavioral: Positive for dysphoric mood. Negative for behavioral problems, sleep disturbance and suicidal ideas. The patient is nervous/anxious.        Objective:   Physical Exam        Assessment & Plan:

## 2019-02-09 NOTE — Patient Instructions (Addendum)
For depression and anxiety, I am refilling your effexor at 75 mg dose to take daily and refilling your buspar.   Your cough is likely chf and reactive airway associated. But with covid restrictions I want you tested at drive thru center on Monday. Then if test negative can double check and try to make arrangements for in office visit. Stay on current lasix and continue albuterol inhaler. Check o2 sats daily. If o2 sat less than 94% let me know. Any severe signs or symptoms then ED evaluation.  If able to see next week will get cxr, cmp and bnp. Assess if dehydrated or if leaning toward chf flare.  Follow approximate middle of next week or as needed.

## 2019-02-12 ENCOUNTER — Other Ambulatory Visit: Payer: Self-pay

## 2019-02-12 DIAGNOSIS — R6889 Other general symptoms and signs: Secondary | ICD-10-CM | POA: Diagnosis not present

## 2019-02-12 DIAGNOSIS — Z20822 Contact with and (suspected) exposure to covid-19: Secondary | ICD-10-CM

## 2019-02-14 LAB — NOVEL CORONAVIRUS, NAA: SARS-CoV-2, NAA: NOT DETECTED

## 2019-02-15 ENCOUNTER — Other Ambulatory Visit: Payer: Self-pay

## 2019-02-15 ENCOUNTER — Encounter: Payer: Self-pay | Admitting: Cardiovascular Disease

## 2019-02-15 ENCOUNTER — Ambulatory Visit (INDEPENDENT_AMBULATORY_CARE_PROVIDER_SITE_OTHER): Payer: Medicare Other | Admitting: Cardiovascular Disease

## 2019-02-15 DIAGNOSIS — I739 Peripheral vascular disease, unspecified: Secondary | ICD-10-CM

## 2019-02-15 NOTE — Progress Notes (Signed)
02/15/2019 Abigail Oliver   12/29/1945  [id number]  Primary Physician Saguier, Iris Pert Primary Cardiologist: Lorretta Harp MD Lupe Carney, [place]  HPI:  Abigail Oliver is a 73 y.o.  moderately overweight married Caucasian female mother of 2, grandmother and one grandchild who worked as a Theme park manager. She was referred to me through the courtesy of Dr. Ellouise Newer for peripheral vascular evaluation because of lifestyle limiting claudication in her right leg.  I last saw her at the time of intervention 09/20/2011.  Her other problems include hypertension, hyperlipidemia and diabetes. She has had coronary bypass grafting in 2000 as well as aortic valve replacement with a St. Jude aortic valve. Her Myoview performed within the last several weeks with nonischemic.  Lower extremity arterial Doppler studies performed prior to her intervention revealed a right ABI of 0.81 with a high-frequency signal in the mid right SFA the left ABI 1.1.   I performed PTA and nitinol self-expanding stenting of an 80% mid right SFA stenosis on 09/20/2011.  She did well after that although I did not see her back in the office.  She has been followed by Dr. [name] for diastolic heart failure.  She has complained of some back hip and knee pain as well as symptoms compatible with diabetic peripheral neuropathy although in talking to her today it does not sound as though she has claudication symptoms.  Doppler studies performed 01/23/2019 revealed normal ABIs bilaterally with a moderately elevated signal in the proximal right popliteal artery.  I suggested that we continue to follow that noninvasively and clinically.    Current Meds  Medication Sig  . albuterol (VENTOLIN HFA) 108 (90 Base) MCG/ACT inhaler Inhale 2 puffs into the lungs every 6 (six) hours as needed for wheezing or shortness of breath.  Marland Kitchen aspirin EC 81 MG tablet Take 1 tablet (81 mg total) by mouth daily.  . busPIRone (BUSPAR) 7.5 MG tablet 1 tab  po bid prn anxiety  . calcium-vitamin D (CALCIUM 500+D) 500-200 MG-UNIT per tablet Take 1 tablet by mouth 2 (two) times daily.   . ferrous sulfate 325 (65 FE) MG tablet Take 1 tablet (325 mg total) by mouth daily.  . folic acid (FOLVITE) 1 MG tablet Take 1 mg by mouth daily.  Marland Kitchen latanoprost (XALATAN) 0.005 % ophthalmic solution PLACE 1 DROP INTO BOTH EYES AT BEDTIME.  . metFORMIN (GLUCOPHAGE) 1000 MG tablet Take 1,000 mg by mouth daily with breakfast.   . metoprolol succinate (TOPROL-XL) 50 MG 24 hr tablet TAKE ONE TABLET BY MOUTH DAILY  . pantoprazole (PROTONIX) 40 MG tablet TAKE ONE TABLET BY MOUTH DAILY  . potassium chloride SA (K-DUR) 20 MEQ tablet Take 20 mEq by mouth 3 (three) times daily.  . rosuvastatin (CRESTOR) 20 MG tablet Take 1 tablet (20 mg total) by mouth daily.  Marland Kitchen torsemide (DEMADEX) 20 MG tablet Take 40 mg by mouth daily.  . traZODone (DESYREL) 50 MG tablet TAKE 1/2 TO 1 TABLET BY MOUTH EVERY NIGHT AT BEDTIME AS NEEDED FOR SLEEP  . venlafaxine XR (EFFEXOR XR) 75 MG 24 hr capsule Take 1 capsule (75 mg total) by mouth daily with breakfast.  . warfarin (COUMADIN) 3 MG tablet TAKE 1/2 TO 1 TABLET BY MOUTH DAILY AS DIRECTED  . [DISCONTINUED] Icosapent Ethyl 1 g CAPS Take 2 capsules (2 g total) by mouth 2 (two) times daily.     Allergies  Allergen Reactions  . Atorvastatin     Muscle pain  .  Simvastatin Other (See Comments)    Muscle pain  . Sulfa Antibiotics     unknown  . Iodinated Diagnostic Agents Rash     Red rash after cardiac cath 1 wk ago, ? Contrast allergy, requires 13 hr prep now per [email or web address]    Social History   Socioeconomic History  . Marital status: Married    Spouse name: [name]  . Number of children: 2  . Years of education: 30  . Highest education level: Not on file  Occupational History    Comment: hair dresser  Social Needs  . Financial resource strain: Not on file  . Food insecurity    Worry: Not on file    Inability: Not on file   . Transportation needs    Medical: Not on file    Non-medical: Not on file  Tobacco Use  . Smoking status: Never Smoker  . Smokeless tobacco: Never Used  Substance and Sexual Activity  . Alcohol use: No  . Drug use: No  . Sexual activity: Never  Lifestyle  . Physical activity    Days per week: Not on file    Minutes per session: Not on file  . Stress: Not on file  Relationships  . Social Herbalist on phone: Not on file    Gets together: Not on file    Attends religious service: Not on file    Active member of club or organization: Not on file    Attends meetings of clubs or organizations: Not on file    Relationship status: Not on file  . Intimate partner violence    Fear of current or ex partner: Not on file    Emotionally abused: Not on file    Physically abused: Not on file    Forced sexual activity: Not on file  Other Topics Concern  . Not on file  Social History Narrative   Pt is a high school graduate with 2 years of college. Married in 1967 she has 1 son born 2 and 1 daughter born 18 and 1 grandchild. 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      Caffeine use- 2-3 /day, soda, tea      Review of Systems: General: negative for chills, fever, night sweats or weight changes.  Cardiovascular: negative for chest pain, dyspnea on exertion, edema, orthopnea, palpitations, paroxysmal nocturnal dyspnea or shortness of breath Dermatological: negative for rash Respiratory: negative for cough or wheezing Urologic: negative for hematuria Abdominal: negative for nausea, vomiting, diarrhea, bright red blood per rectum, melena, or hematemesis Neurologic: negative for visual changes, syncope, or dizziness All other systems reviewed and are otherwise negative except as noted above.    Blood pressure 136/72, pulse 70, height 5\' 2"  (1.575 m), weight 178 lb (80.7 kg), SpO2 98 %.  General appearance: alert and no distress Neck: no adenopathy,  no carotid bruit, no JVD, supple, symmetrical, trachea midline and thyroid not enlarged, symmetric, no tenderness/mass/nodules Lungs: clear to auscultation bilaterally Heart: regular rate and rhythm, S1, S2 normal, no murmur, click, rub or gallop Extremities: extremities normal, atraumatic, no cyanosis or edema Pulses: 2+ and symmetric Skin: Skin color, texture, turgor normal. No rashes or lesions Neurologic: Alert and oriented X 3, normal strength and tone. Normal symmetric reflexes. Normal coordination and gait  EKG not performed today  ASSESSMENT AND PLAN:   PVD, Rt SFA PTA/Stent 09/20/11 Ms. Hambric was referred back to me by Dr. [name], her cardiologist,  for reevaluation of PAD.  I performed PTA and stenting of a 80% mid right SFA stenosis with a 6 mm x 40 mm long nitinol self-expanding stent 4/13.  She complains of difficulty ambulating because of pain in her back hip and knee.  She also has symptoms compatible with diabetic peripheral neuropathy.  She does not really describe symptoms of claudication however.  Recent lower extremity arterial Doppler studies performed 01/23/2019 revealed normal ABIs bilaterally with a moderately elevated 6 velocity signal in the right popliteal artery proximally.  This is probably in the 50% range.  Given her by and triphasic arterial signals and lack of typical claudication symptoms I am inclined to continue to follow her noninvasively and clinically.  We will get lower extremity arterial Doppler studies in 12 months after which I will see her back.      Lorretta Harp MD FACP,FACC,FAHA, [facility] 02/15/2019 8:45 AM

## 2019-02-15 NOTE — Assessment & Plan Note (Addendum)
Abigail Oliver was referred back to me by Dr. [name], her cardiologist, for reevaluation of PAD.  I performed PTA and stenting of a 80% mid right SFA stenosis with a 6 mm x 40 mm long nitinol self-expanding stent 4/13.  She complains of difficulty ambulating because of pain in her back hip and knee.  She also has symptoms compatible with diabetic peripheral neuropathy.  She does not really describe symptoms of claudication however.  Recent lower extremity arterial Doppler studies performed 01/23/2019 revealed normal ABIs bilaterally with a moderately elevated 6 velocity signal in the right popliteal artery proximally.  This is probably in the 50% range.  Given her by and triphasic arterial signals and lack of typical claudication symptoms I am inclined to continue to follow her noninvasively and clinically.  We will get lower extremity arterial Doppler studies in 12 months after which I will see her back.

## 2019-02-15 NOTE — Patient Instructions (Signed)
Medication Instructions:  Your physician recommends that you continue on your current medications as directed. Please refer to the Current Medication list given to you today.  If you need a refill on your cardiac medications before your next appointment, please call your pharmacy.   Lab work: NONE If you have labs (blood work) drawn today and your tests are completely normal, you will receive your results only by: Marland Kitchen MyChart Message (if you have MyChart) OR . A paper copy in the mail If you have any lab test that is abnormal or we need to change your treatment, we will call you to review the results.  Testing/Procedures: Your physician has requested that you have a lower or upper extremity arterial duplex. This test is an ultrasound of the arteries in the legs or arms. It looks at arterial blood flow in the legs and arms. Allow one hour for Lower and Upper Arterial scans. There are no restrictions or special instructions DUE IN 12 MONTHS  Your physician has requested that you have an ankle brachial index (ABI). During this test an ultrasound and blood pressure cuff are used to evaluate the arteries that supply the arms and legs with blood. Allow thirty minutes for this exam. There are no restrictions or special instructions. DUE IN 12 MONTHS  Follow-Up: At [facility], you and your health needs are our priority.  As part of our continuing mission to provide you with exceptional heart care, we have created designated Provider Care Teams.  These Care Teams include your primary Cardiologist (physician) and Advanced Practice Providers (APPs -  Physician Assistants and Nurse Practitioners) who all work together to provide you with the care you need, when you need it. You will need a follow up appointment in 12 months with Dr. Quay Burow.  Please call our office 2 months in advance to schedule this appointment. PLEASE HAVE YOUR ULTRASOUNDS COMPLETED BEFORE THIS APPOINTMENT.

## 2019-02-26 ENCOUNTER — Other Ambulatory Visit: Payer: Self-pay

## 2019-02-26 ENCOUNTER — Ambulatory Visit (INDEPENDENT_AMBULATORY_CARE_PROVIDER_SITE_OTHER): Payer: Medicare Other | Admitting: Pharmacist

## 2019-02-26 DIAGNOSIS — Z7901 Long term (current) use of anticoagulants: Secondary | ICD-10-CM | POA: Diagnosis not present

## 2019-02-26 DIAGNOSIS — Z952 Presence of prosthetic heart valve: Secondary | ICD-10-CM | POA: Diagnosis not present

## 2019-02-26 LAB — POCT INR: INR: 3.3 — AB (ref 2.0–3.0)

## 2019-03-08 ENCOUNTER — Other Ambulatory Visit: Payer: Self-pay | Admitting: Medical

## 2019-04-03 ENCOUNTER — Other Ambulatory Visit: Payer: Self-pay | Admitting: Medical

## 2019-04-09 ENCOUNTER — Ambulatory Visit (INDEPENDENT_AMBULATORY_CARE_PROVIDER_SITE_OTHER): Payer: Medicare Other | Admitting: Pharmacist

## 2019-04-09 ENCOUNTER — Other Ambulatory Visit: Payer: Self-pay

## 2019-04-09 DIAGNOSIS — Z7901 Long term (current) use of anticoagulants: Secondary | ICD-10-CM | POA: Diagnosis not present

## 2019-04-09 DIAGNOSIS — Z952 Presence of prosthetic heart valve: Secondary | ICD-10-CM | POA: Diagnosis not present

## 2019-04-09 LAB — POCT INR: INR: 1.9 — AB (ref 2.0–3.0)

## 2019-04-16 DIAGNOSIS — H401131 Primary open-angle glaucoma, bilateral, mild stage: Secondary | ICD-10-CM | POA: Diagnosis not present

## 2019-04-16 DIAGNOSIS — Z961 Presence of intraocular lens: Secondary | ICD-10-CM | POA: Diagnosis not present

## 2019-04-16 DIAGNOSIS — E119 Type 2 diabetes mellitus without complications: Secondary | ICD-10-CM | POA: Diagnosis not present

## 2019-04-20 ENCOUNTER — Other Ambulatory Visit: Payer: Self-pay | Admitting: Medical

## 2019-04-20 ENCOUNTER — Other Ambulatory Visit (HOSPITAL_COMMUNITY): Payer: Self-pay | Admitting: Cardiology

## 2019-04-20 ENCOUNTER — Other Ambulatory Visit: Payer: Self-pay | Admitting: Cardiovascular Disease

## 2019-04-29 ENCOUNTER — Telehealth: Payer: Self-pay | Admitting: Medical

## 2019-04-30 ENCOUNTER — Other Ambulatory Visit: Payer: Self-pay

## 2019-04-30 ENCOUNTER — Ambulatory Visit (INDEPENDENT_AMBULATORY_CARE_PROVIDER_SITE_OTHER): Payer: Medicare Other | Admitting: Pharmacist

## 2019-04-30 DIAGNOSIS — Z952 Presence of prosthetic heart valve: Secondary | ICD-10-CM | POA: Diagnosis not present

## 2019-04-30 DIAGNOSIS — I2510 Atherosclerotic heart disease of native coronary artery without angina pectoris: Secondary | ICD-10-CM

## 2019-04-30 DIAGNOSIS — Z7901 Long term (current) use of anticoagulants: Secondary | ICD-10-CM

## 2019-04-30 DIAGNOSIS — I209 Angina pectoris, unspecified: Secondary | ICD-10-CM | POA: Diagnosis not present

## 2019-04-30 LAB — POCT INR: INR: 2.7 (ref 2.0–3.0)

## 2019-04-30 MED ORDER — VENLAFAXINE HCL ER 75 MG PO CP24: ORAL_CAPSULE | ORAL | 3 refills | Status: DC

## 2019-04-30 NOTE — Telephone Encounter (Signed)
I went ahead and sent effexor refill. How is she doing with mood and anxiety. If doing well then ask her to scheduled follow up in a month(virtual). But if she feels with current regimen mood and anxiety not controlled adequate then follow up this week.

## 2019-04-30 NOTE — Telephone Encounter (Signed)
Attempted to reach pt and left message to check mychart acct. Message sent via mychart.

## 2019-04-30 NOTE — Telephone Encounter (Signed)
Abigail Oliver -- venlafaxine refill sent to pharmacy. Pt last seen 02/09/19 and has not future appts scheduled with you. When is she due for follow up?

## 2019-05-10 ENCOUNTER — Encounter (HOSPITAL_COMMUNITY): Payer: Self-pay | Admitting: Cardiology

## 2019-05-10 ENCOUNTER — Other Ambulatory Visit: Payer: Self-pay

## 2019-05-10 ENCOUNTER — Ambulatory Visit (HOSPITAL_COMMUNITY)
Admission: RE | Admit: 2019-05-10 | Discharge: 2019-05-10 | Disposition: A | Discharge status: Home / Self Care | Payer: Medicare Other | Source: Ambulatory Visit | Attending: Cardiology | Admitting: Cardiology

## 2019-05-10 VITALS — BP 103/76 | HR 81 | Wt 173.6 lb

## 2019-05-10 DIAGNOSIS — Z7901 Long term (current) use of anticoagulants: Secondary | ICD-10-CM | POA: Insufficient documentation

## 2019-05-10 DIAGNOSIS — I739 Peripheral vascular disease, unspecified: Secondary | ICD-10-CM

## 2019-05-10 DIAGNOSIS — Z951 Presence of aortocoronary bypass graft: Secondary | ICD-10-CM | POA: Insufficient documentation

## 2019-05-10 DIAGNOSIS — Z8249 Family history of ischemic heart disease and other diseases of the circulatory system: Secondary | ICD-10-CM | POA: Insufficient documentation

## 2019-05-10 DIAGNOSIS — Z952 Presence of prosthetic heart valve: Secondary | ICD-10-CM | POA: Insufficient documentation

## 2019-05-10 DIAGNOSIS — E785 Hyperlipidemia, unspecified: Secondary | ICD-10-CM | POA: Insufficient documentation

## 2019-05-10 DIAGNOSIS — Z91041 Radiographic dye allergy status: Secondary | ICD-10-CM | POA: Insufficient documentation

## 2019-05-10 DIAGNOSIS — E119 Type 2 diabetes mellitus without complications: Secondary | ICD-10-CM | POA: Diagnosis not present

## 2019-05-10 DIAGNOSIS — Z7984 Long term (current) use of oral hypoglycemic drugs: Secondary | ICD-10-CM | POA: Diagnosis not present

## 2019-05-10 DIAGNOSIS — Z79899 Other long term (current) drug therapy: Secondary | ICD-10-CM | POA: Insufficient documentation

## 2019-05-10 DIAGNOSIS — I110 Hypertensive heart disease with heart failure: Secondary | ICD-10-CM | POA: Insufficient documentation

## 2019-05-10 DIAGNOSIS — Z7982 Long term (current) use of aspirin: Secondary | ICD-10-CM | POA: Diagnosis not present

## 2019-05-10 DIAGNOSIS — G4733 Obstructive sleep apnea (adult) (pediatric): Secondary | ICD-10-CM | POA: Insufficient documentation

## 2019-05-10 DIAGNOSIS — I6521 Occlusion and stenosis of right carotid artery: Secondary | ICD-10-CM | POA: Insufficient documentation

## 2019-05-10 DIAGNOSIS — K219 Gastro-esophageal reflux disease without esophagitis: Secondary | ICD-10-CM | POA: Diagnosis not present

## 2019-05-10 DIAGNOSIS — I5032 Chronic diastolic (congestive) heart failure: Secondary | ICD-10-CM | POA: Insufficient documentation

## 2019-05-10 DIAGNOSIS — I2510 Atherosclerotic heart disease of native coronary artery without angina pectoris: Secondary | ICD-10-CM | POA: Insufficient documentation

## 2019-05-10 LAB — BASIC METABOLIC PANEL
Anion gap: 14 (ref 5–15)
BUN: 18 mg/dL (ref 8–23)
CO2: 23 mmol/L (ref 22–32)
Calcium: 9.5 mg/dL (ref 8.9–10.3)
Chloride: 100 mmol/L (ref 98–111)
Creatinine, Ser: 1.5 mg/dL — ABNORMAL HIGH (ref 0.44–1.00)
GFR calc Af Amer: 40 mL/min — ABNORMAL LOW (ref >60)
GFR calc non Af Amer: 34 mL/min — ABNORMAL LOW (ref >60)
Glucose, Bld: 369 mg/dL — ABNORMAL HIGH (ref 70–99)
Potassium: 3.8 mmol/L (ref 3.5–5.1)
Sodium: 137 mmol/L (ref 135–145)

## 2019-05-10 NOTE — Patient Instructions (Signed)
No medication changes today!  Labs today We will only contact you if something comes back abnormal or we need to make some changes. Otherwise no news is good news!  Your physician recommends that you schedule a follow-up appointment in: 6 months.  Our scheduler will call you to schedule an appointment in March/APril. Please call us if you do not get a call.  At the [facility], you and your health needs are our priority. As part of our continuing mission to provide you with exceptional heart care, we have created designated Provider Care Teams. These Care Teams include your primary Cardiologist (physician) and Advanced Practice Providers (APPs- Physician Assistants and Nurse Practitioners) who all work together to provide you with the care you need, when you need it.   You may see any of the following providers on your designated Care Team at your next follow up: Marland Kitchen Dr Glori Bickers . Dr Loralie Champagne . Darrick Grinder, NP . Lyda Jester, PA . Audry Riles, PharmD   Please be sure to bring in all your medications bottles to every appointment.  '

## 2019-05-10 NOTE — Progress Notes (Signed)
Patient ID: Abigail Oliver, female   DOB: February 11, 1946, 73 y.o.   MRN: [id number] PCP: Dr. Sharlet Salina HF Cardiology: Dr. [name]  73 y.o. yo with history of aortic stenosis s/p St Jude mechanical AVR (6/00) and CAD s/p single vessel CABG (6/00) presents for cardiology evaluation.  She had severe aortic stenosis and therefore required CABG-AVR in 6/00.  She had a RIMA-RCA. She had a myoview in 4/13 with no ischemia or infarction, and echo in 6/14 showed normal EF with moderate diastolic dysfunction and a well-seated mechanical valve.    In 6/15, torsemide was stopped and she was begun on Lasix due to increased cost of torsemide.  She developed dyspnea and lower extremity edema.  She was admitted later that month with community-acquired PNA as well as CHF.  CT showed RLL PNA and multiple pulmonary nodules.  Echo in 6/15 showed EF 60-65%, mechanical aortic valve with mean gradient 27 mmHg. After discharge, she continued to be short of breath.  Lasix was increased to 80 mg bid.  This did not help much.  She was short of breath walking around in her yard.  No chest pain.  Lexiscan Cardiolite was done in 7/15 given the exertional symptoms and showed a small area of basal inferolateral ischemia.   Given ongoing exertional symptoms, I did a right and left heart catheterization in 7/15.  Right and left heart filling pressures were normal.  There was an 80% stenosis in the proximal LAD that was hemodynamically significant by FFR. Patient had DES to proximal LAD.    Echo in 9/20 showed EF 60-65%, normal St Jude mechanical aortic valve with mean gradient 13 mmHg.  Peripheral arterial dopplers in 9/20 showed 50-79% right popliteal stenosis.  She saw Dr. Gwenlyn Found and it was decided to manage this medically.   She returns today for followup of diastolic CHF and mechanical aortic valve.  She is stable symptomatically.  Not working currently due to coronavirus epidemic.  She has generalized fatigue.  Mainly limited by low back  pain. She does not have claudication. She is able to get around the grocery store generally ok.  She is short of breath walking about 100 feet. This has been fairly chronic.    ECG (personally reviewed): NSR, poor RWP  Labs (6/14): BNP 216, HCT 32, K 4.8, creatinine 0.8 Labs (8/14): K 4.4, creatinine 0.8, BNP 55, HDL 45, LDL 141 Labs (10/14): LDL 61, HDL 43, AST 40, ALT 31 Labs (6/15): BNP 37 Labs (7/15): K 4.4, creatinine 0.8 => 0.77 Labs (9/15): HCT 34.5, LDL 32, HDL 40 Labs (4/16): K 3.8, creatinine 0.96 Labs (5/16): K 4.4, creatinine 1.02, LDL 138 Labs (6/16): K 4.3, creatinine 1.34, BNP 83 Labs (9/16): TSH normal Labs (12/16): K 3.8, creatinine 1.05, BNP 87 Labs (2/17): BNP 35, K 4.6, creatinine 1.18, HCT 34.9 Labs (8/20): K 4, creatinine 1.4, BNP 280, hgb 10.3, LDL 53  Labs (9/20): K 4, creatinine 1.46  PMH: 1. Aortic stenosis: Now has St Jude mechanical aortic valve (6/00).  Echo (6/14) with EF 60-65%, mild LVH, moderate diastolic dysfunction, normal RV, mechanical aortic valve with mean gradient 20 mmHg.  Echo (6/15) with EF 60-65%, mechanical aortic valve with mean gradient 27 mmHg.  - Mean gradient across mechanical AoV in 7/18 was 17 mmHg (lower).  - Mean gradient across mechanical AoV in 9/20 was 13 mmHg 2. CAD: CABG at time of AVR in 6/00 with RIMA-RCA.  Myoview in 4/13 with EF 72%, no ischemia or infarction. Lexiscan  Cardiolite (7/15) with EF 69%, small area of basal inferolateral ischemia.  LHC (7/15) with patent RIMA-RCA, 80% mRCA, 80% pLAD with FFR 0.73, treated with DES to pLAD.  - Cardiolite (3/17): Normal.  3. HTN 4. Type II diabetes 5. GERD 6. OSA 7. Hyperlipidemia 8. PAD: Right SFA stent. Peripheral arterial dopplers (7/15) with patent right SFA stent. Peripheral arterial dopplers (7/16) with right SFA stent patent.  - Peripheral arterial dopplers in 9/20 showed 50-79% right popliteal stenosis 9. Colon polyps 10. Carotid stenosis: Carotid dopplers (11/13) witih  50-69% RICA.  Carotid dopplers (7/15) with 40-59% RICA. Carotid dopplers (7/16) with 40-59% BICA stenosis.  - Carotid dopplers (7/18): 40-59% RICA stenosis  - Carotid dopplers (9/20): Mild BICA stenosis.  11. Diastolic CHF 12. Gastritis 13. Dizziness: Holter (8/14) with few PACs, otherwise unremarkable.  Event monitor (2/17) with no significant arrhythmias.  Dizziness is probably due to BPPV.  14. Mild transaminase elevation 15. Pulmonary nodules: Noted by CT in 6/15.  CT chest (8/15) was thought to indicate that nodules were benign.  No further workup was recommended.  16. Contrast allergy 17. Diastolic CHF: RHC (123456) with mean RA 2, PA 22/11, mean PCWP 9, CI 3.79 - Echo (7/18): EF 65-70%, mechanical aortic valve with mean gradient 17 mmHg, mild MR.  - Echo (9/20): EF 60-65%, normal St Jude mechanical aortic valve with mean gradient 13 mmHg. 18. Low back pain. 19. Possible c-spine arthritis with pain down left arm.    SH: Married, works as Theme park manager, nonsmoker.   FH: CAD  ROS: All systems reviewed and negative except as per HPI.   Current Outpatient Medications  Medication Sig Dispense Refill  . ascorbic acid (VITAMIN C) 500 MG tablet Take 500 mg by mouth as needed.     Marland Kitchen aspirin EC 81 MG tablet Take 1 tablet (81 mg total) by mouth daily.    . busPIRone (BUSPAR) 7.5 MG tablet TAKE ONE TABLET BY MOUTH TWICE A DAY AS NEEDED FOR ANXIETY 60 tablet 0  . calcium-vitamin D (CALCIUM 500+D) 500-200 MG-UNIT per tablet Take 1 tablet by mouth 2 (two) times daily.     . ferrous sulfate 325 (65 FE) MG tablet Take 1 tablet (325 mg total) by mouth daily. 30 tablet 0  . folic acid (FOLVITE) 1 MG tablet Take 1 mg by mouth daily.    Marland Kitchen latanoprost (XALATAN) 0.005 % ophthalmic solution PLACE 1 DROP INTO BOTH EYES AT BEDTIME. 2.5 mL 2  . metFORMIN (GLUCOPHAGE) 1000 MG tablet Take 1,000 mg by mouth daily with breakfast.     . metoprolol succinate (TOPROL-XL) 50 MG 24 hr tablet TAKE ONE TABLET BY MOUTH  DAILY 90 tablet 0  . pantoprazole (PROTONIX) 40 MG tablet Take 40 mg by mouth as needed.    . potassium chloride SA (K-DUR) 20 MEQ tablet Take 20 mEq by mouth 3 (three) times daily.    . rosuvastatin (CRESTOR) 20 MG tablet Take 1 tablet (20 mg total) by mouth daily. 90 tablet 3  . torsemide (DEMADEX) 20 MG tablet Take 40 mg by mouth daily.    . traZODone (DESYREL) 50 MG tablet TAKE 1/2 TO 1 TABLET BY MOUTH EVERY NIGHT AT BEDTIME AS NEEDED FOR SLEEP 16 tablet 0  . warfarin (COUMADIN) 3 MG tablet TAKE 1/2 TO 1 TABLET BY MOUTH DAILY AS DIRECTED 30 tablet 0   No current facility-administered medications for this encounter.    BP 103/76   Pulse 81   Wt 78.7 kg (173 lb  9.6 oz)   SpO2 100%   BMI 31.75 kg/m  General: NAD Neck: No JVD, no thyromegaly or thyroid nodule.  Lungs: Clear to auscultation bilaterally with normal respiratory effort. CV: Nondisplaced PMI.  Heart regular S1/S2 with mechanical S2, no S3/S4, 2/6 early SEM RUSB.  No peripheral edema.  No carotid bruit.  Normal pedal pulses.  Abdomen: Soft, nontender, no hepatosplenomegaly, no distention.  Skin: Intact without lesions or rashes.  Neurologic: Alert and oriented x 3.  Psych: Normal affect. Extremities: No clubbing or cyanosis.  HEENT: Normal.   Assessment/Plan: 1. St Jude mechanical aortic valve:  Mean gradient across the aortic valve has been elevated in the past, but 9/20 echo had mean gradient down to 13 mmHg which is within normal range. - Continue ASA 81 + warfarin.  - She will need endocarditis prophylaxis with dental work.    2. Chronic diastolic CHF: Chronic NYHA class II-III symptoms, she is now volume overloaded on exam.   - Continue torsemide 40 mg daily. BMET today.   3. CAD: s/p RIMA-RCA at time of AVR.  She developed exertional dyspnea and had cath showing significant proximal LAD stenosis in 7/15 that was treated with DES.  3/17 Cardiolite was normal.  No chest pain.    - Given mechanical valve, she is on  regimen of ASA 81 daily + warfarin.  - Continue statin.   4. Hyperlipidemia: Myalgias with atorvastatin and simvastatin.  She is tolerating Crestor 20 mg daily.  - Good lipids in 8/20.  5. Carotid stenosis: Mild on dopplers in 9/20.  6. PAD: Known PAD with right SFA stent.  Last peripheral arterial dopplers showed moderate right popliteal stenosis.  No definite claudication.  - Medical management per Dr. Gwenlyn Found.    Followup in 6 months.   Loralie Champagne 05/10/2019

## 2019-05-15 ENCOUNTER — Other Ambulatory Visit (HOSPITAL_COMMUNITY): Payer: Self-pay | Admitting: Cardiology

## 2019-05-17 ENCOUNTER — Other Ambulatory Visit: Payer: Self-pay | Admitting: Medical

## 2019-05-17 ENCOUNTER — Other Ambulatory Visit (HOSPITAL_COMMUNITY): Payer: Self-pay | Admitting: Cardiology

## 2019-05-21 ENCOUNTER — Telehealth (HOSPITAL_COMMUNITY): Payer: Self-pay | Admitting: *Deleted

## 2019-05-21 NOTE — Telephone Encounter (Signed)
Pt called stating she had a bad cough that's making her chest and lungs hurt. Pt denies any other problems and this time. Pt wanted to make sure it wasn't a reaction to any of the medications she is on. Dr.McLean did not think it was her medications and pt was advised to follow up with PCP.

## 2019-05-22 ENCOUNTER — Ambulatory Visit (INDEPENDENT_AMBULATORY_CARE_PROVIDER_SITE_OTHER): Payer: Medicare Other | Admitting: Medical

## 2019-05-22 ENCOUNTER — Encounter: Payer: Self-pay | Admitting: Medical

## 2019-05-22 ENCOUNTER — Other Ambulatory Visit: Payer: Self-pay

## 2019-05-22 VITALS — HR 66 | Ht 62.0 in | Wt 179.0 lb

## 2019-05-22 DIAGNOSIS — R05 Cough: Secondary | ICD-10-CM

## 2019-05-22 DIAGNOSIS — R059 Cough, unspecified: Secondary | ICD-10-CM

## 2019-05-22 DIAGNOSIS — F329 Major depressive disorder, single episode, unspecified: Secondary | ICD-10-CM | POA: Diagnosis not present

## 2019-05-22 DIAGNOSIS — I509 Heart failure, unspecified: Secondary | ICD-10-CM | POA: Diagnosis not present

## 2019-05-22 DIAGNOSIS — M5441 Lumbago with sciatica, right side: Secondary | ICD-10-CM

## 2019-05-22 DIAGNOSIS — I6523 Occlusion and stenosis of bilateral carotid arteries: Secondary | ICD-10-CM

## 2019-05-22 DIAGNOSIS — M25559 Pain in unspecified hip: Secondary | ICD-10-CM

## 2019-05-22 DIAGNOSIS — F32A Depression, unspecified: Secondary | ICD-10-CM

## 2019-05-22 DIAGNOSIS — G4700 Insomnia, unspecified: Secondary | ICD-10-CM

## 2019-05-22 DIAGNOSIS — G8929 Other chronic pain: Secondary | ICD-10-CM

## 2019-05-22 MED ORDER — HYDROCODONE-HOMATROPINE 5-1.5 MG/5ML PO SYRP: 5.0000 mL | ORAL_SOLUTION | Freq: Four times a day (QID) | ORAL | 0 refills | Status: DC | PRN

## 2019-05-22 NOTE — Progress Notes (Signed)
Subjective:    Patient ID: Abigail Oliver, female    DOB: May 12, 1946, 73 y.o.   MRN: [id number]  HPI   Virtual Visit via Telephone Note  I connected with [name] on 05/22/19 at  8:20 AM EST by telephone and verified that I am speaking with the correct person using two identifiers.  Location: Patient: home Provider: office   I discussed the limitations, risks, security and privacy concerns of performing an evaluation and management service by telephone and the availability of in person appointments. I also discussed with the patient that there may be a patient responsible charge related to this service. The patient expressed understanding and agreed to proceed.   History of Present Illness: Pt states she has severe cough that is dry. She states chronic but worse recently. She states cough is dry. No productive cough. Pt does not have any fever. Some pedal edema that is chronic. Pt weight about same as it was in September.  Pt states venlafaxine caused her to get nightmares. She weened her self off and night mare resolved.She states tapered off. Pt is still on trazadone and buspirone. Pt has some depression, anxiety and insomnia.   Pt states recently has back pain lumbar area, rt hip area and some pain toward her rt thigh area.     Observations/Objective:  General- no acute distress, pleasant, alert, oriented and normal speech.   Assessment and Plan: For chronic cough but worse past approximate 2 weeks will get cxr today. Assess status of chf as well as work up differential.  Will rx hycodan for cough. This should/can help with your back pain as well since hydrocodone is component. Will refer you to pain management as well per your request for the back pain and hip pain.  For depression, anxiety and insomnia continue trazadone and buspar.  Will ask MA to call pt and give number to do covid test. Will advise to get in event cxr negative for chf flare. Pt symptoms not  real suspicious but will proceed with caution. Also might need to get her to come in next week for labs so covid test may be beneficial in that regard.  Follow up date to be determined.  Follow Up Instructions:    I discussed the assessment and treatment plan with the patient. The patient was provided an opportunity to ask questions and all were answered. The patient agreed with the plan and demonstrated an understanding of the instructions.   The patient was advised to call back or seek an in-person evaluation if the symptoms worsen or if the condition fails to improve as anticipated.  I provided 25 minutes of non-face-to-face time during this encounter.   [name], PA-C  Pt states she has severe cough that is dry. She states chronic but worse recently. She states cough is dry. No productive cough.   Pt states venlafaxine caused her to get nightmares. She weened her self off and night mare resolved.She states tapered off. Pt is still on trazadone and buspirone. Pt has some depression, anxiety and insomnia.   Pt states recently has back pain lumbar area, rt hip area and some pain toward her rt thigh area.        Review of Systems  Constitutional: Negative for chills, diaphoresis and fatigue.  HENT: Negative for congestion and ear pain.   Respiratory: Positive for cough. Negative for choking, shortness of breath and wheezing.   Cardiovascular: Negative for chest pain and palpitations.  Gastrointestinal: Negative  for abdominal pain, constipation, diarrhea and vomiting.  Genitourinary: Negative for dysuria.  Musculoskeletal: Negative for back pain and neck pain.  Neurological: Negative for dizziness, speech difficulty, weakness and headaches.  Hematological: Negative for adenopathy. Does not bruise/bleed easily.  Psychiatric/Behavioral: Negative for behavioral problems, confusion and dysphoric mood. The patient is not nervous/anxious.     Past Medical History:  Diagnosis  Date  . Anemia   . Anxiety   . Aortic stenosis    a. Now has St Jude mechanical aortic valve (6/00).b. Echo (6/15) with EF 60-65%, mechanical aortic valve with mean gradient 27 mmHg.  . Arthritis    a. Possible c-spine arthritis with pain down left arm.   . Carotid artery disease ([place])    a. Carotid dopplers (7/16) with 40-59% BICA stenosis.   . Chronic angle-closure glaucoma(365.23)   . Chronic diastolic CHF (congestive heart failure) ([place])    a.  RHC (7/15) with mean RA 2, PA 22/11, mean PCWP 9, CI 3.79.  Marland Kitchen Colon polyps   . Contrast media allergy   . Coronary atherosclerosis of native coronary artery    a. CABG at time of AVR in 6/00 with RIMA-RCA.b. Abnl nuc 11/2013 -> LHC (7/15) with patent RIMA-RCA, 80% mRCA, 80% pLAD with FFR 0.73, treated with DES to pLAD.  Marland Kitchen Depression   . Dizziness 02/09/2012   a. Holter (8/14) with few PACs, otherwise unremarkable.   . Elevated transaminase level   . Essential hypertension   . Gastritis   . GERD (gastroesophageal reflux disease)   . Hyperlipidemia   . Low back pain   . Mechanical heart valve present   . Neuromuscular disorder (HCC)    neuropathy  . Peripheral vascular disease ([place])    a, H/o Right SFA stent. b. Peripheral arterial dopplers (7/16) with right SFA stent patent.   Marland Kitchen PONV (postoperative nausea and vomiting)    N&V  . Pulmonary nodules    a. Noted by CT in 6/15. CT chest (8/15) was thought to indicate that nodules were benign. No further workup was recommended.   . Syncope 03/29/2012   carotid doppler - R ICA 50-69% reduction by velocities (low end); L ICA 0-49% reduction by velocities (low end); R and L subclavian arteries - <50% redcution; R and L vertebral arteries show normal antegrade flow  . Type II diabetes mellitus ([place])      Social History   Socioeconomic History  . Marital status: Married    Spouse name: [name]  . Number of children: 2  . Years of education: 6  . Highest education level: Not on file    Occupational History    Comment: hair dresser  Tobacco Use  . Smoking status: Never Smoker  . Smokeless tobacco: Never Used  Substance and Sexual Activity  . Alcohol use: No  . Drug use: No  . Sexual activity: Never  Other Topics Concern  . Not on file  Social History Narrative   Pt is a high school graduate with 2 years of college. Married in 1967 she has 1 son born 100 and 1 daughter born 1 and 1 grandchild. 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      Caffeine use- 2-3 /day, soda, tea    Social Determinants of Health   Financial Resource Strain:   . Difficulty of Paying Living Expenses: Not on file  Food Insecurity:   . Worried About Charity fundraiser in the Last Year: Not  on file  . Ran Out of Food in the Last Year: Not on file  Transportation Needs:   . Lack of Transportation (Medical): Not on file  . Lack of Transportation (Non-Medical): Not on file  Physical Activity:   . Days of Exercise per Week: Not on file  . Minutes of Exercise per Session: Not on file  Stress:   . Feeling of Stress : Not on file  Social Connections:   . Frequency of Communication with Friends and Family: Not on file  . Frequency of Social Gatherings with Friends and Family: Not on file  . Attends Religious Services: Not on file  . Active Member of Clubs or Organizations: Not on file  . Attends Archivist Meetings: Not on file  . Marital Status: Not on file  Intimate Partner Violence:   . Fear of Current or Ex-Partner: Not on file  . Emotionally Abused: Not on file  . Physically Abused: Not on file  . Sexually Abused: Not on file    Past Surgical History:  Procedure Laterality Date  . BILATERAL OOPHORECTOMY  1987  . BREAST BIOPSY  2012   left  . CARDIAC CATHETERIZATION  01/14/1999   normal LV function, severe aortic stenosis; 80% and 70% stenosis in RCA; mild 20% distal norrowing in L main with 20% proximal LAD stenosis, 40% diagonal stenosis and  20% proximal circumflex stenosis  . CARDIAC VALVE REPLACEMENT  2000   aortic valve   . CARDIOVASCULAR STRESS TEST  08/25/2011   R/L MV - EF 72%; no scintigraphic evidence of inducible MI; normal perfusionTID of 1.25 elevated - could indicate small vessle subendocardial ischemia; EKG NSR at 66, non diagnostic for ischemia  . [place]   bilaterally  . CATARACT EXTRACTION W/ INTRAOCULAR LENS  IMPLANT, BILATERAL  ~ 2007  . [place]; 1971  . CHOLECYSTECTOMY  1990  . COLONOSCOPY N/A 10/27/2012   Procedure: COLONOSCOPY;  Surgeon: Beryle Beams, MD;  Location: [facility];  Service: Endoscopy;  Laterality: N/A;  . CORONARY ARTERY BYPASS GRAFT  2000   RIMA to RCA  . DOPPLER ECHOCARDIOGRAPHY  03/29/2012   EF >55%; mild concentric LVH; stage 1 diastolic dysfunction, elevated LV filling pressure, dilated LA; MAC mild MR; St Jude AVR peak and mean gradients of 21mHg and 260mg; transvalvular gradients have increased (prev 23 and 14 respectively)  . ESOPHAGOGASTRODUODENOSCOPY N/A 10/27/2012   Procedure: ESOPHAGOGASTRODUODENOSCOPY (EGD);  Surgeon: PaBeryle BeamsMD;  [facility];  Service: Endoscopy;  Laterality: N/A;  . FEMORAL ARTERY STENT  09/20/2011   6 x 40 Smart Nitinol self-expanding stent placed;  10/15/2031 -R SFA stent open and patent w/o evidence of restenosis  . FRACTIONAL FLOW RESERVE WIRE  12/14/2013   Procedure: FRACTIONAL FLOW RESERVE WIRE;  Surgeon: DaLarey DresserMD;  [facility];  Service: Cardiovascular;;  . LACERATION REPAIR     right hand  . LEFT AND RIGHT HEART CATHETERIZATION WITH CORONARY ANGIOGRAM N/A 12/14/2013   Procedure: LEFT AND RIGHT HEART CATHETERIZATION WITH CORONARY ANGIOGRAM;  Surgeon: DaLarey DresserMD;  [facility];  Service: Cardiovascular;  Laterality: N/A;  . LOWER EXTREMITY ANGIOGRAM N/A 09/20/2011   Procedure: LOWER EXTREMITY ANGIOGRAM;  Surgeon: JoLorretta HarpMD;  [facility];  Service:  Cardiovascular;  Laterality: N/A;  . LYMPH NODE BIOPSY  06/2011   "core needle on 5"  . needle biopsy  2009   "on ankles for  nerve damage"  . NEUROPLASTY / TRANSPOSITION ULNAR NERVE AT ELBOW     right  . PERCUTANEOUS CORONARY STENT INTERVENTION (PCI-S)  12/14/2013   Procedure: PERCUTANEOUS CORONARY STENT INTERVENTION (PCI-S);  Surgeon: Larey Dresser, MD;  Location: [facility] CATH LAB;  Service: Cardiovascular;;  Prox LAD 3.00x12 Promus DES   . PERIPHERAL ARTERIAL STENT GRAFT  09/20/11   right SFA  . REFRACTIVE SURGERY  ` 2004   "for glaucoma"  . TONSILLECTOMY  1968  . TRIGGER FINGER RELEASE Left 12/04/2015   Procedure: LEFT RING FINGER TRIGGER RELEASE;  Surgeon: Leanora Cover, MD;  Location: [place];  Service: Orthopedics;  Laterality: Left;  Marland Kitchen VAGINAL HYSTERECTOMY  1985   Fibroids    Family History  Problem Relation Age of Onset  . Colon cancer Mother   . COPD Mother   . Emphysema Mother   . Cancer Maternal Grandmother   . Cancer Maternal Grandfather   . Heart disease Brother   . Diabetes Neg Hx     Allergies  Allergen Reactions  . Atorvastatin     Muscle pain  . Simvastatin Other (See Comments)    Muscle pain  . Sulfa Antibiotics     unknown  . Iodinated Diagnostic Agents Rash     Red rash after cardiac cath 1 wk ago, ? Contrast allergy, requires 13 hr prep now per [email or web address]    Current Outpatient Medications on File Prior to Visit  Medication Sig Dispense Refill  . ascorbic acid (VITAMIN C) 500 MG tablet Take 500 mg by mouth as needed.     Marland Kitchen aspirin EC 81 MG tablet Take 1 tablet (81 mg total) by mouth daily.    . busPIRone (BUSPAR) 7.5 MG tablet TAKE ONE TABLET BY MOUTH TWICE A DAY AS NEEDED FOR ANXIETY 60 tablet 0  . calcium-vitamin D (CALCIUM 500+D) 500-200 MG-UNIT per tablet Take 1 tablet by mouth 2 (two) times daily.     . ferrous sulfate 325 (65 FE) MG tablet Take 1 tablet (325 mg total) by mouth daily. 30 tablet 0  . folic acid (FOLVITE)  1 MG tablet Take 1 mg by mouth daily.    Marland Kitchen latanoprost (XALATAN) 0.005 % ophthalmic solution PLACE 1 DROP INTO BOTH EYES AT BEDTIME. 2.5 mL 2  . metFORMIN (GLUCOPHAGE) 1000 MG tablet Take 1,000 mg by mouth daily with breakfast.     . metoprolol succinate (TOPROL-XL) 50 MG 24 hr tablet TAKE ONE TABLET BY MOUTH DAILY 90 tablet 0  . pantoprazole (PROTONIX) 40 MG tablet Take 40 mg by mouth as needed.    . potassium chloride SA (K-DUR) 20 MEQ tablet Take 20 mEq by mouth 3 (three) times daily.    . rosuvastatin (CRESTOR) 20 MG tablet Take 1 tablet (20 mg total) by mouth daily. 90 tablet 3  . torsemide (DEMADEX) 20 MG tablet TAKE THREE TABLETS BY MOUTH EVERY MORNING AND TAKE TWO TABLETS BY MOUTH EVERY EVENING 450 tablet 0  . traZODone (DESYREL) 50 MG tablet TAKE 1/2 TO 1 TABLET BY MOUTH EVERY NIGHT AT BEDTIME AS NEEDED FOR SLEEP 16 tablet 0  . warfarin (COUMADIN) 3 MG tablet TAKE 1/2 TO 1 TABLET BY MOUTH DAILY AS DIRECTED 30 tablet 0   No current facility-administered medications on file prior to visit.    Pulse 66   Ht _0  (1.575 m)   Wt 179 lb (81.2 kg)   SpO2 97%   BMI 32.74 kg/m       Objective:  Physical Exam        Assessment & Plan:

## 2019-05-22 NOTE — Patient Instructions (Addendum)
For chronic cough but worse past approximate 2 weeks will get cxr today. Assess status of chf as well as work up differential.  Will rx hycodan for cough. This should/can help with your back pain as well since hydrocodone is component. Will refer you to pain management as well per your request for the back pain and hip pain.  For depression, anxiety and insomnia continue trazadone and buspar.  Will ask MA to call pt and give number to do covid test. Will advise to get in event cxr negative for chf flare. Pt symptoms not real suspicious but will proceed with caution. Also might need to get her to come in next week for labs so covid test may be beneficial in that regard.  Follow up date to be determined.

## 2019-05-24 ENCOUNTER — Other Ambulatory Visit: Payer: Self-pay | Admitting: Medical

## 2019-05-28 ENCOUNTER — Ambulatory Visit (INDEPENDENT_AMBULATORY_CARE_PROVIDER_SITE_OTHER): Payer: Medicare Other | Admitting: Pharmacist Clinician (PhC)/ Clinical Pharmacy Specialist

## 2019-05-28 ENCOUNTER — Other Ambulatory Visit: Payer: Self-pay

## 2019-05-28 DIAGNOSIS — Z952 Presence of prosthetic heart valve: Secondary | ICD-10-CM | POA: Diagnosis not present

## 2019-05-28 DIAGNOSIS — Z7901 Long term (current) use of anticoagulants: Secondary | ICD-10-CM | POA: Diagnosis not present

## 2019-05-28 LAB — POCT INR: INR: 3.3 — AB (ref 2.0–3.0)

## 2019-05-31 ENCOUNTER — Encounter: Payer: Self-pay | Admitting: Medical

## 2019-06-04 ENCOUNTER — Telehealth: Payer: Self-pay | Admitting: Medical

## 2019-06-04 DIAGNOSIS — F329 Major depressive disorder, single episode, unspecified: Secondary | ICD-10-CM

## 2019-06-04 DIAGNOSIS — F32A Depression, unspecified: Secondary | ICD-10-CM

## 2019-06-04 NOTE — Telephone Encounter (Signed)
Referral to behavioral health placed. Will you call pt and give her number so she can call herself.

## 2019-06-04 NOTE — Telephone Encounter (Signed)
I though I saw some paperwork this morning about pain management referral. Glanced at sheet and think it was records request. Have you seen that? Did you handle that? Pt asking about delay of referal to pain management.

## 2019-06-05 NOTE — Telephone Encounter (Signed)
Thanks

## 2019-06-05 NOTE — Telephone Encounter (Signed)
Yes I faxed records to Pain Management yesterday

## 2019-06-11 ENCOUNTER — Other Ambulatory Visit: Payer: Self-pay | Admitting: Medical

## 2019-06-11 ENCOUNTER — Other Ambulatory Visit: Payer: Self-pay | Admitting: Cardiovascular Disease

## 2019-06-13 ENCOUNTER — Other Ambulatory Visit: Payer: Self-pay | Admitting: Medical

## 2019-06-18 ENCOUNTER — Other Ambulatory Visit: Payer: Self-pay

## 2019-06-18 ENCOUNTER — Ambulatory Visit: Payer: Medicare Other | Attending: Internal Medicine

## 2019-06-18 ENCOUNTER — Ambulatory Visit (HOSPITAL_BASED_OUTPATIENT_CLINIC_OR_DEPARTMENT_OTHER)
Admission: RE | Admit: 2019-06-18 | Discharge: 2019-06-18 | Disposition: A | Discharge status: Home / Self Care | Payer: Medicare Other | Source: Ambulatory Visit | Attending: Medical | Admitting: Medical

## 2019-06-18 DIAGNOSIS — R05 Cough: Secondary | ICD-10-CM | POA: Insufficient documentation

## 2019-06-18 DIAGNOSIS — I509 Heart failure, unspecified: Secondary | ICD-10-CM | POA: Insufficient documentation

## 2019-06-18 DIAGNOSIS — Z20822 Contact with and (suspected) exposure to covid-19: Secondary | ICD-10-CM

## 2019-06-18 DIAGNOSIS — R059 Cough, unspecified: Secondary | ICD-10-CM

## 2019-06-19 LAB — NOVEL CORONAVIRUS, NAA: SARS-CoV-2, NAA: NOT DETECTED

## 2019-06-23 ENCOUNTER — Ambulatory Visit: Payer: Medicare Other

## 2019-06-28 ENCOUNTER — Ambulatory Visit: Payer: Medicare Other | Attending: Internal Medicine

## 2019-06-28 DIAGNOSIS — Z23 Encounter for immunization: Secondary | ICD-10-CM

## 2019-06-28 NOTE — Progress Notes (Signed)
   Covid-19 Vaccination Clinic  Name:  Abigail Oliver    MRN: [id number] DOB: 12/07/1945  06/28/2019  Abigail Oliver was observed post Covid-19 immunization for 15 minutes without incidence. She was provided with Vaccine Information Sheet and instruction to access the V-Safe system.   Abigail Oliver was instructed to call 911 with any severe reactions post vaccine: Marland Kitchen Difficulty breathing  . Swelling of your face and throat  . A fast heartbeat  . A bad rash all over your body  . Dizziness and weakness    Immunizations Administered    Name Date Dose VIS Date Route   Pfizer COVID-19 Vaccine 06/28/2019 10:25 AM 0.3 mL 05/04/2019 Intramuscular   Manufacturer: [place]   Lot: [id number]   [place]: [id number]

## 2019-07-02 DIAGNOSIS — Z79899 Other long term (current) drug therapy: Secondary | ICD-10-CM | POA: Diagnosis not present

## 2019-07-02 DIAGNOSIS — G894 Chronic pain syndrome: Secondary | ICD-10-CM | POA: Diagnosis not present

## 2019-07-02 DIAGNOSIS — M25551 Pain in right hip: Secondary | ICD-10-CM | POA: Diagnosis not present

## 2019-07-02 DIAGNOSIS — Z79891 Long term (current) use of opiate analgesic: Secondary | ICD-10-CM | POA: Diagnosis not present

## 2019-07-02 DIAGNOSIS — M545 Low back pain: Secondary | ICD-10-CM | POA: Diagnosis not present

## 2019-07-05 ENCOUNTER — Other Ambulatory Visit: Payer: Self-pay | Admitting: Medical

## 2019-07-05 ENCOUNTER — Other Ambulatory Visit: Payer: Self-pay | Admitting: Cardiovascular Disease

## 2019-07-05 ENCOUNTER — Other Ambulatory Visit: Payer: Self-pay | Admitting: Pain Medicine

## 2019-07-05 DIAGNOSIS — M545 Low back pain, unspecified: Secondary | ICD-10-CM

## 2019-07-05 DIAGNOSIS — G8929 Other chronic pain: Secondary | ICD-10-CM

## 2019-07-05 DIAGNOSIS — M25559 Pain in unspecified hip: Secondary | ICD-10-CM

## 2019-07-09 ENCOUNTER — Ambulatory Visit (INDEPENDENT_AMBULATORY_CARE_PROVIDER_SITE_OTHER): Payer: Medicare Other | Admitting: Pharmacist Clinician (PhC)/ Clinical Pharmacy Specialist

## 2019-07-09 ENCOUNTER — Other Ambulatory Visit: Payer: Self-pay

## 2019-07-09 ENCOUNTER — Other Ambulatory Visit: Payer: Self-pay | Admitting: Pharmacist Clinician (PhC)/ Clinical Pharmacy Specialist

## 2019-07-09 DIAGNOSIS — Z952 Presence of prosthetic heart valve: Secondary | ICD-10-CM | POA: Diagnosis not present

## 2019-07-09 DIAGNOSIS — Z7901 Long term (current) use of anticoagulants: Secondary | ICD-10-CM

## 2019-07-09 LAB — POCT INR: INR: 3.2 — AB (ref 2.0–3.0)

## 2019-07-09 MED ORDER — WARFARIN SODIUM 3 MG PO TABS: ORAL_TABLET | ORAL | 0 refills | Status: DC

## 2019-07-14 ENCOUNTER — Ambulatory Visit: Payer: Medicare Other

## 2019-07-17 ENCOUNTER — Other Ambulatory Visit: Payer: Self-pay

## 2019-07-17 ENCOUNTER — Other Ambulatory Visit (HOSPITAL_BASED_OUTPATIENT_CLINIC_OR_DEPARTMENT_OTHER): Payer: Self-pay | Admitting: Pain Medicine

## 2019-07-17 ENCOUNTER — Ambulatory Visit (HOSPITAL_BASED_OUTPATIENT_CLINIC_OR_DEPARTMENT_OTHER)
Admission: RE | Admit: 2019-07-17 | Discharge: 2019-07-17 | Disposition: A | Discharge status: Home / Self Care | Payer: Medicare Other | Source: Ambulatory Visit | Attending: Pain Medicine | Admitting: Pain Medicine

## 2019-07-17 DIAGNOSIS — G894 Chronic pain syndrome: Secondary | ICD-10-CM

## 2019-07-17 DIAGNOSIS — M25551 Pain in right hip: Secondary | ICD-10-CM | POA: Diagnosis not present

## 2019-07-17 DIAGNOSIS — M25552 Pain in left hip: Secondary | ICD-10-CM | POA: Diagnosis not present

## 2019-07-23 ENCOUNTER — Other Ambulatory Visit (HOSPITAL_COMMUNITY): Payer: Self-pay | Admitting: Cardiology

## 2019-07-23 ENCOUNTER — Ambulatory Visit: Payer: Medicare Other | Attending: Internal Medicine

## 2019-07-23 ENCOUNTER — Telehealth: Payer: Self-pay

## 2019-07-23 DIAGNOSIS — Z23 Encounter for immunization: Secondary | ICD-10-CM | POA: Insufficient documentation

## 2019-07-23 NOTE — Telephone Encounter (Signed)
Patient called in to see if Abigail Oliver can put in a referral for a bone density Doctor. The patient would like a follow up call as soon as possible at [phone]  Thanks,

## 2019-07-23 NOTE — Progress Notes (Signed)
2  Covid-19 Vaccination Clinic  Name:  Abigail Oliver    MRN: [id number] DOB: 1945/12/17  07/23/2019  [name] was observed post Covid-19 immunization for 15 minutes without incidence. She was provided with Vaccine Information Sheet and instruction to access the V-Safe system.   [name] was instructed to call 911 with any severe reactions post vaccine: Marland Kitchen Difficulty breathing  . Swelling of your face and throat  . A fast heartbeat  . A bad rash all over your body  . Dizziness and weakness    Immunizations Administered    Name Date Dose VIS Date Route   Pfizer COVID-19 Vaccine 07/23/2019  2:08 PM 0.3 mL 05/04/2019 Intramuscular   Manufacturer: [place]   Lot: [id number]   [place]: [id number]

## 2019-07-24 ENCOUNTER — Telehealth: Payer: Self-pay | Admitting: Medical

## 2019-07-24 DIAGNOSIS — M810 Age-related osteoporosis without current pathological fracture: Secondary | ICD-10-CM

## 2019-07-24 MED ORDER — ALENDRONATE SODIUM 10 MG PO TABS: 10.0000 mg | ORAL_TABLET | Freq: Every day | ORAL | 11 refills | Status: DC

## 2019-07-24 NOTE — Telephone Encounter (Signed)
I found old dexa scan dated 2016 before she was my patient. States some osteoporosis. Was she ever on fosmax medication to help fight osteoporosis. Placed dexascan to repeat. Should be able to do at [facility]. Also placing order for vitamin D. She should get calcium over the counter. Please schedule her for the vit D lab and advise her to call xray dept to schedule dexascan.

## 2019-07-24 NOTE — Telephone Encounter (Signed)
dexa scan ordered.

## 2019-07-24 NOTE — Telephone Encounter (Signed)
Vit d order placed

## 2019-07-25 ENCOUNTER — Telehealth: Payer: Self-pay

## 2019-07-25 NOTE — Telephone Encounter (Signed)
mychart message

## 2019-07-26 ENCOUNTER — Other Ambulatory Visit: Payer: Medicare Other

## 2019-07-26 DIAGNOSIS — M810 Age-related osteoporosis without current pathological fracture: Secondary | ICD-10-CM | POA: Diagnosis not present

## 2019-07-28 ENCOUNTER — Other Ambulatory Visit: Payer: Self-pay

## 2019-07-28 ENCOUNTER — Ambulatory Visit
Admission: RE | Admit: 2019-07-28 | Discharge: 2019-07-28 | Disposition: A | Discharge status: Home / Self Care | Payer: Medicare Other | Source: Ambulatory Visit | Attending: Pain Medicine | Admitting: Pain Medicine

## 2019-07-28 DIAGNOSIS — M25559 Pain in unspecified hip: Secondary | ICD-10-CM

## 2019-07-28 DIAGNOSIS — M48061 Spinal stenosis, lumbar region without neurogenic claudication: Secondary | ICD-10-CM | POA: Diagnosis not present

## 2019-07-28 DIAGNOSIS — G8929 Other chronic pain: Secondary | ICD-10-CM

## 2019-07-28 DIAGNOSIS — M545 Low back pain, unspecified: Secondary | ICD-10-CM

## 2019-07-30 DIAGNOSIS — G894 Chronic pain syndrome: Secondary | ICD-10-CM | POA: Diagnosis not present

## 2019-07-30 DIAGNOSIS — Z79899 Other long term (current) drug therapy: Secondary | ICD-10-CM | POA: Diagnosis not present

## 2019-07-30 DIAGNOSIS — M545 Low back pain: Secondary | ICD-10-CM | POA: Diagnosis not present

## 2019-07-30 DIAGNOSIS — M25551 Pain in right hip: Secondary | ICD-10-CM | POA: Diagnosis not present

## 2019-07-30 DIAGNOSIS — Z79891 Long term (current) use of opiate analgesic: Secondary | ICD-10-CM | POA: Diagnosis not present

## 2019-08-01 ENCOUNTER — Other Ambulatory Visit: Payer: Self-pay | Admitting: Medical

## 2019-08-02 ENCOUNTER — Telehealth: Payer: Self-pay | Admitting: Medical

## 2019-08-02 NOTE — Telephone Encounter (Signed)
Pt's son Wille Glaser is calling to speak to Percell Miller about his mother. [name] wouldn't provide much more info. Stated Percell Miller should be expecting his call.

## 2019-08-02 NOTE — Telephone Encounter (Signed)
Is son on DPR? I am not in office and can't call pt son today or any day unless have allotted time for call in office? Have not seen her son in a while he is pt. If on dpr/hippa recommend offer him appointment to catch up on his medical problems and then can discuss his mom at that time. A lot 40 minutes.   If son not on dpr/hippa then schedule appointment for mom and can discuss with pt with her on phone call or video visit.

## 2019-08-02 NOTE — Telephone Encounter (Signed)
Called patient - lvm.

## 2019-08-02 NOTE — Telephone Encounter (Signed)
Son just wants a referral to neurologist for mother's cognitive issues or would like for you to assess her.

## 2019-08-02 NOTE — Telephone Encounter (Signed)
Can you call and get her scheduled.for virtual phone  visit on monday. I don't see that I discussed memory issues this with her any time recentkly. Can assess her and then place referral to neurologist.

## 2019-08-02 NOTE — Telephone Encounter (Signed)
Abigail Oliver her son is on the [place]. I did inform him you were gone for today and that you will be seeing patients as soon as you come in tomorrow. His mother and father both are on your schedule for tomorrow. he states he wants to discuss her medical concerns for whatever reason she's coming in for. [name] wasn't given a time of your return call.

## 2019-08-03 ENCOUNTER — Telehealth: Payer: Self-pay | Admitting: Medical

## 2019-08-03 ENCOUNTER — Other Ambulatory Visit: Payer: Self-pay

## 2019-08-03 ENCOUNTER — Ambulatory Visit (INDEPENDENT_AMBULATORY_CARE_PROVIDER_SITE_OTHER): Payer: Medicare Other | Admitting: Medical

## 2019-08-03 VITALS — BP 117/28 | HR 75 | Temp 96.8°F | Resp 18 | Wt 182.0 lb

## 2019-08-03 DIAGNOSIS — H6123 Impacted cerumen, bilateral: Secondary | ICD-10-CM

## 2019-08-03 DIAGNOSIS — G4700 Insomnia, unspecified: Secondary | ICD-10-CM | POA: Diagnosis not present

## 2019-08-03 DIAGNOSIS — F32A Depression, unspecified: Secondary | ICD-10-CM

## 2019-08-03 DIAGNOSIS — R413 Other amnesia: Secondary | ICD-10-CM | POA: Diagnosis not present

## 2019-08-03 DIAGNOSIS — F329 Major depressive disorder, single episode, unspecified: Secondary | ICD-10-CM | POA: Diagnosis not present

## 2019-08-03 DIAGNOSIS — M810 Age-related osteoporosis without current pathological fracture: Secondary | ICD-10-CM | POA: Diagnosis not present

## 2019-08-03 NOTE — Telephone Encounter (Signed)
Pt needs bone density approval. Never need bone density. Can you work on this. Pt already scheduled. Please investigate for me.

## 2019-08-03 NOTE — Patient Instructions (Addendum)
You have hx of osteoporosis. dexascan order placed. Asking staff to check on prior auth request.  Vit D lab done. Result pending. Asking staff to check why not back yet?  Will hold off on fosamax presently as you report hx of bispohsphanate use in the past. Will follow dexascan.  For depression and insomnia increase trazadone to 100 mg at night. Failed other meds various ssri and wellbturin.  For ear wax attempted lavage today.  For memory issues/forgetfullness referring to neurologist as son requested.  Follow up 3 weeks or as needed

## 2019-08-03 NOTE — Progress Notes (Signed)
   Subjective:    Patient ID: Abigail Oliver, female    DOB: [birth year], 74 y.o.   MRN: [id number]  HPI  Pt in for follow up.  Pt has not been scheduled for bone density scan. I put the order in. Needs prior authorization. Pt has been on bisphonates years ago. I rx'd fosamax without knowing she had been on years ago. Advised not to use presently but follow up with repeat dexascan.  Vit D pending. Not back yet. Hx of osteoporosis.  Pt states son thinks she needs evaluation of memory. Pt states she gets forgetful. Example thought she lost something and was in her purse. Getting lost easily. Not able to recognize friends house. Pt not sure about friends name.  Pt in past had depression and anxiety. She is on trazadone for insomnia. Pt thinks buspar does not help for anxiety. Pt has hx of SSRI use with no improvement. Do not want to rx benzo as she is on narcotics with pain management.  Pt is on hydrocodone for back pain.  Pt has cerumen impaction. Could not get hearing test due to wax. So audiology advised get wax cleared first.         Review of Systems     Objective:   Physical Exam  General Mental Status- Alert. General Appearance- Not in acute distress.   Skin General: Color- Normal Color. Moisture- Normal Moisture.  Neck Carotid Arteries- Normal color. Moisture- Normal Moisture. No carotid bruits. No JVD.  Chest and Lung Exam Auscultation: Breath Sounds:-Normal.  Cardiovascular Auscultation:Rythm- Regular. Murmurs & Other Heart Sounds:Auscultation of the heart reveals- No Murmurs.  Abdomen Inspection:-Inspeection Normal. Palpation/Percussion:Note:No mass. Palpation and Percussion of the abdomen reveal- Non Tender, Non Distended + BS, no rebound or guarding.    Neurologic Cranial Nerve exam:- CN III-XII intact(No nystagmus), symmetric smile. Strength:- 5/5 equal and symmetric strength both upper and lower extremities.  Heent- wax blocking view of both tm.  Failed lavage and failed attempt removal with currette.     Assessment & Plan:  You have hx of osteoporosis. dexascan order placed. Asking staff to check on prior auth request.  Vit D lab done. Result pending. Asking staff to check why not back yet?  Will hold off on fosamax presently as you report hx of bispohsphanate use in the past. Will follow dexascan.  For depression and insomnia increase trazadone to 100 mg at night. Failed other meds various ssri and wellbturin.  For ear wax attempted lavage today.  For memory issues/forgetfullness referring to neurologist as son requested.  Follow up 3 weeks or as needed  45 minutes spent with pt. Time spent discussing pt hx of osteoporosis, answering her question, discussing memory concerns, depression and wax removal  [name], PA-C

## 2019-08-06 ENCOUNTER — Telehealth: Payer: Self-pay | Admitting: Medical

## 2019-08-06 ENCOUNTER — Encounter: Payer: Self-pay | Admitting: Neurology

## 2019-08-06 DIAGNOSIS — M810 Age-related osteoporosis without current pathological fracture: Secondary | ICD-10-CM

## 2019-08-06 NOTE — Telephone Encounter (Signed)
Reordered dexascan placed.

## 2019-08-06 NOTE — Telephone Encounter (Signed)
DG dexa order placed

## 2019-08-06 NOTE — Telephone Encounter (Signed)
Imaging dept states this should be for a regular DG Dexa not for HM Dexa Scan that is entered. Please advise.

## 2019-08-08 DIAGNOSIS — M47817 Spondylosis without myelopathy or radiculopathy, lumbosacral region: Secondary | ICD-10-CM | POA: Diagnosis not present

## 2019-08-10 LAB — VITAMIN D 1,25 DIHYDROXY
Vitamin D 1, 25 (OH)2 Total: 70 pg/mL — ABNORMAL HIGH
Vitamin D2 1, 25 (OH)2: <10 pg/mL
Vitamin D3 1, 25 (OH)2: 62 pg/mL

## 2019-08-14 ENCOUNTER — Other Ambulatory Visit: Payer: Self-pay | Admitting: Medical

## 2019-08-14 DIAGNOSIS — H401131 Primary open-angle glaucoma, bilateral, mild stage: Secondary | ICD-10-CM | POA: Diagnosis not present

## 2019-08-20 ENCOUNTER — Other Ambulatory Visit: Payer: Self-pay

## 2019-08-20 ENCOUNTER — Ambulatory Visit (INDEPENDENT_AMBULATORY_CARE_PROVIDER_SITE_OTHER): Payer: Medicare Other | Admitting: Pharmacist Clinician (PhC)/ Clinical Pharmacy Specialist

## 2019-08-20 DIAGNOSIS — Z7901 Long term (current) use of anticoagulants: Secondary | ICD-10-CM | POA: Diagnosis not present

## 2019-08-20 DIAGNOSIS — Z952 Presence of prosthetic heart valve: Secondary | ICD-10-CM | POA: Diagnosis not present

## 2019-08-20 LAB — POCT INR: INR: 1.9 — AB (ref 2.0–3.0)

## 2019-08-20 NOTE — Telephone Encounter (Signed)
This encounter was created in error - please disregard.

## 2019-08-20 NOTE — Patient Instructions (Signed)
Take 2 tablets today Monday March 29, then continue with 1 tablet daily except 1/2 tablet each Tuesday, Thursday and Saturday.  Repeat INR in 3 weeks

## 2019-09-03 ENCOUNTER — Encounter: Payer: Self-pay | Admitting: Medical

## 2019-09-03 DIAGNOSIS — Z1231 Encounter for screening mammogram for malignant neoplasm of breast: Secondary | ICD-10-CM | POA: Diagnosis not present

## 2019-09-03 DIAGNOSIS — M8588 Other specified disorders of bone density and structure, other site: Secondary | ICD-10-CM | POA: Diagnosis not present

## 2019-09-03 DIAGNOSIS — M810 Age-related osteoporosis without current pathological fracture: Secondary | ICD-10-CM | POA: Diagnosis not present

## 2019-09-10 ENCOUNTER — Ambulatory Visit (INDEPENDENT_AMBULATORY_CARE_PROVIDER_SITE_OTHER): Payer: Medicare Other | Admitting: Pharmacist

## 2019-09-10 ENCOUNTER — Other Ambulatory Visit: Payer: Self-pay

## 2019-09-10 DIAGNOSIS — G894 Chronic pain syndrome: Secondary | ICD-10-CM | POA: Diagnosis not present

## 2019-09-10 DIAGNOSIS — Z7901 Long term (current) use of anticoagulants: Secondary | ICD-10-CM | POA: Diagnosis not present

## 2019-09-10 DIAGNOSIS — M47817 Spondylosis without myelopathy or radiculopathy, lumbosacral region: Secondary | ICD-10-CM | POA: Diagnosis not present

## 2019-09-10 DIAGNOSIS — M5136 Other intervertebral disc degeneration, lumbar region: Secondary | ICD-10-CM | POA: Diagnosis not present

## 2019-09-10 DIAGNOSIS — M545 Low back pain: Secondary | ICD-10-CM | POA: Diagnosis not present

## 2019-09-10 DIAGNOSIS — Z952 Presence of prosthetic heart valve: Secondary | ICD-10-CM

## 2019-09-10 LAB — POCT INR: INR: 3.8 — AB (ref 2.0–3.0)

## 2019-09-12 ENCOUNTER — Telehealth: Payer: Self-pay | Admitting: Medical

## 2019-09-12 DIAGNOSIS — N6311 Unspecified lump in the right breast, upper outer quadrant: Secondary | ICD-10-CM | POA: Diagnosis not present

## 2019-09-12 NOTE — Telephone Encounter (Signed)
Mammogram order signed. Will you see that on your desk and fax it over after lunch. Looks like her appointment is at 2:45.

## 2019-09-12 NOTE — Telephone Encounter (Signed)
Order faxed.

## 2019-09-12 NOTE — Telephone Encounter (Signed)
Reviewed pt recent dexa scan. Shows osteoporosis. She is on alondrenate. Has vit and calcium combination tab per epic review.  Scanning the report from [facility] to [facility].

## 2019-09-24 ENCOUNTER — Telehealth: Payer: Self-pay | Admitting: Medical

## 2019-09-24 NOTE — Telephone Encounter (Signed)
Reviewing pt mammogram study which mentioned need for [place] to evaluate oval mass seen on mammogram. Was the US done?  Her dexascan showed osteoporosis. Take alondrenated, vit D and calcium. Repeat dexascan in 2 years.

## 2019-09-25 ENCOUNTER — Encounter: Payer: Self-pay | Admitting: Medical

## 2019-09-25 DIAGNOSIS — Z862 Personal history of diseases of the blood and blood-forming organs and certain disorders involving the immune mechanism: Secondary | ICD-10-CM | POA: Diagnosis not present

## 2019-09-25 DIAGNOSIS — E1140 Type 2 diabetes mellitus with diabetic neuropathy, unspecified: Secondary | ICD-10-CM | POA: Diagnosis not present

## 2019-09-25 DIAGNOSIS — R296 Repeated falls: Secondary | ICD-10-CM | POA: Diagnosis not present

## 2019-09-25 DIAGNOSIS — Z5181 Encounter for therapeutic drug level monitoring: Secondary | ICD-10-CM | POA: Diagnosis not present

## 2019-09-25 DIAGNOSIS — H6123 Impacted cerumen, bilateral: Secondary | ICD-10-CM | POA: Diagnosis not present

## 2019-09-25 NOTE — Telephone Encounter (Signed)
Have pt call over to imaging center and see if should be done. The mammogram I read mentioned to do additonal imaging?

## 2019-09-25 NOTE — Telephone Encounter (Signed)
[place] was not done.

## 2019-09-27 ENCOUNTER — Other Ambulatory Visit: Payer: Self-pay

## 2019-09-27 ENCOUNTER — Ambulatory Visit: Payer: Medicare Other | Attending: Internal Medicine | Admitting: Physical Therapy

## 2019-09-27 ENCOUNTER — Encounter: Payer: Self-pay | Admitting: Physical Therapy

## 2019-09-27 DIAGNOSIS — R296 Repeated falls: Secondary | ICD-10-CM

## 2019-09-27 DIAGNOSIS — G8929 Other chronic pain: Secondary | ICD-10-CM | POA: Diagnosis not present

## 2019-09-27 DIAGNOSIS — M545 Low back pain, unspecified: Secondary | ICD-10-CM

## 2019-09-27 DIAGNOSIS — M6281 Muscle weakness (generalized): Secondary | ICD-10-CM | POA: Insufficient documentation

## 2019-09-27 NOTE — Patient Instructions (Signed)
Access Code: [email or web address] URL: [email or web address] Date: 09/27/2019 Prepared by: Hilda Blades  Exercises Supine Lower Trunk Rotation - 1 x daily - 7 x weekly - 10 reps - 5 seconds hold Supine Piriformis Stretch with Foot on Ground - 1 x daily - 7 x weekly - 2 reps - 20 seconds hold Supine Single Knee to Chest - 1 x daily - 7 x weekly - 2 reps - 20 seconds hold Standing Hip Abduction with Counter Support - 1 x daily - 7 x weekly - 2 sets - 10 reps Standing Hip Extension with Counter Support - 1 x daily - 7 x weekly - 2 sets - 10 reps Standing March with Counter Support - 1 x daily - 7 x weekly - 2 sets - 10 reps Mini Squat with Counter Support - 1 x daily - 7 x weekly - 2 sets - 10 reps Heel rises with counter support - 1 x daily - 7 x weekly - 2 sets - 10 reps

## 2019-09-27 NOTE — Progress Notes (Signed)
NEUROLOGY CONSULTATION NOTE  Abigail Oliver MRN: [id number] DOB: 1946/03/02  Referring provider: [name], PA-C Primary care provider: [name], PA-C  Reason for consult:  Memory changes  HISTORY OF PRESENT ILLNESS: Abigail Oliver is a 74 year old right-handed white female with CHF, CAD, aortic stenosis s/p St Jude mechanical aortic valve, HTN who presents for memory changes.  History supplemented by referring provider's note.  Her son is concerned about her memory.  He tells her that keeps repeating things to her.  He will text her and after reading, she quickly forgets it.  She drives.  She may make a wrong turn in familiar areas but is able to get herself back on route.  She keeps her medication bottles in a basket and write the amount she needs to take.  With this method, she has been able to take her medications appropriately.  She denies not able to recognize faces and names of close friends and family.  She works as a Probation officer and is able to continue working and remembering her clients.  She handles the  Household finances without difficulty.  She is able to run errands and go grocery shopping without use of a list.  She is also hard of hearing.  She has depression and anxiety, related to family issues particularly checking in on her daughter and caring for her husband who has had some cognitive decline.  She takes trazodone for insomnia.  She also has chronic low back and sciatic pain, knee pain and neuropathy which has not been successfully treated.  Labs: 05/10/2019 BMP with Na 137, K 3.8, Cl 100, CO2 23, glucose 369, BUN 18, Cr 1.50, GFR 34 07/26/2019 Vitamin D 1,25 dihydroxy 70. She reports that one of her physicians recently checked B12 and thyroid, which was reportedly okay.  Imaging: MRI of brain from 02/24/2015 showed global atrophy and mild to moderate small vessel disease  PAST MEDICAL HISTORY: Past Medical History:  Diagnosis Date  . Anemia   .  Anxiety   . Aortic stenosis    a. Now has St Jude mechanical aortic valve (6/00).b. Echo (6/15) with EF 60-65%, mechanical aortic valve with mean gradient 27 mmHg.  . Arthritis    a. Possible c-spine arthritis with pain down left arm.   . Carotid artery disease ([place])    a. Carotid dopplers (7/16) with 40-59% BICA stenosis.   . Chronic angle-closure glaucoma(365.23)   . Chronic diastolic CHF (congestive heart failure) ([place])    a.  RHC (7/15) with mean RA 2, PA 22/11, mean PCWP 9, CI 3.79.  Marland Kitchen Colon polyps   . Contrast media allergy   . Coronary atherosclerosis of native coronary artery    a. CABG at time of AVR in 6/00 with RIMA-RCA.b. Abnl nuc 11/2013 -> LHC (7/15) with patent RIMA-RCA, 80% mRCA, 80% pLAD with FFR 0.73, treated with DES to pLAD.  Marland Kitchen Depression   . Dizziness 02/09/2012   a. Holter (8/14) with few PACs, otherwise unremarkable.   . Elevated transaminase level   . Essential hypertension   . Gastritis   . GERD (gastroesophageal reflux disease)   . Hyperlipidemia   . Low back pain   . Mechanical heart valve present   . Neuromuscular disorder ([facility])    neuropathy  . Peripheral vascular disease ([place])    a, H/o Right SFA stent. b. Peripheral arterial dopplers (7/16) with right SFA stent patent.   Marland Kitchen PONV (postoperative nausea and vomiting)  N&V  . Pulmonary nodules    a. Noted by CT in 6/15. CT chest (8/15) was thought to indicate that nodules were benign. No further workup was recommended.   . Syncope 03/29/2012   carotid doppler - R ICA 50-69% reduction by velocities (low end); L ICA 0-49% reduction by velocities (low end); R and L subclavian arteries - <50% redcution; R and L vertebral arteries show normal antegrade flow  . Type II diabetes mellitus ([place])     PAST SURGICAL HISTORY: Past Surgical History:  Procedure Laterality Date  . BILATERAL OOPHORECTOMY  1987  . BREAST BIOPSY  2012   left  . CARDIAC CATHETERIZATION  01/14/1999   normal LV function, severe  aortic stenosis; 80% and 70% stenosis in RCA; mild 20% distal norrowing in L main with 20% proximal LAD stenosis, 40% diagonal stenosis and 20% proximal circumflex stenosis  . CARDIAC VALVE REPLACEMENT  2000   aortic valve   . CARDIOVASCULAR STRESS TEST  08/25/2011   R/L MV - EF 72%; no scintigraphic evidence of inducible MI; normal perfusionTID of 1.25 elevated - could indicate small vessle subendocardial ischemia; EKG NSR at 66, non diagnostic for ischemia  . [place]   bilaterally  . CATARACT EXTRACTION W/ INTRAOCULAR LENS  IMPLANT, BILATERAL  ~ 2007  . [place]; 1971  . CHOLECYSTECTOMY  1990  . COLONOSCOPY N/A 10/27/2012   Procedure: COLONOSCOPY;  Surgeon: Beryle Beams, MD;  Location: [facility];  Service: Endoscopy;  Laterality: N/A;  . CORONARY ARTERY BYPASS GRAFT  2000   RIMA to RCA  . DOPPLER ECHOCARDIOGRAPHY  03/29/2012   EF >55%; mild concentric LVH; stage 1 diastolic dysfunction, elevated LV filling pressure, dilated LA; MAC mild MR; St Jude AVR peak and mean gradients of 52mHg and 225mg; transvalvular gradients have increased (prev 23 and 14 respectively)  . ESOPHAGOGASTRODUODENOSCOPY N/A 10/27/2012   Procedure: ESOPHAGOGASTRODUODENOSCOPY (EGD);  Surgeon: PaBeryle BeamsMD;  [facility];  Service: Endoscopy;  Laterality: N/A;  . FEMORAL ARTERY STENT  09/20/2011   6 x 40 Smart Nitinol self-expanding stent placed;  10/15/2031 -R SFA stent open and patent w/o evidence of restenosis  . FRACTIONAL FLOW RESERVE WIRE  12/14/2013   Procedure: FRACTIONAL FLOW RESERVE WIRE;  Surgeon: DaLarey DresserMD;  [facility];  Service: Cardiovascular;;  . LACERATION REPAIR     right hand  . LEFT AND RIGHT HEART CATHETERIZATION WITH CORONARY ANGIOGRAM N/A 12/14/2013   Procedure: LEFT AND RIGHT HEART CATHETERIZATION WITH CORONARY ANGIOGRAM;  Surgeon: DaLarey DresserMD;  [facility];  Service: Cardiovascular;  Laterality: N/A;  . LOWER  EXTREMITY ANGIOGRAM N/A 09/20/2011   Procedure: LOWER EXTREMITY ANGIOGRAM;  Surgeon: JoLorretta HarpMD;  [facility];  Service: Cardiovascular;  Laterality: N/A;  . LYMPH NODE BIOPSY  06/2011   "core needle on 5"  . needle biopsy  2009   "on ankles for nerve damage"  . NEUROPLASTY / TRANSPOSITION ULNAR NERVE AT ELBOW     right  . PERCUTANEOUS CORONARY STENT INTERVENTION (PCI-S)  12/14/2013   Procedure: PERCUTANEOUS CORONARY STENT INTERVENTION (PCI-S);  Surgeon: DaLarey DresserMD;  [facility];  Service: Cardiovascular;;  Prox LAD 3.00x12 Promus DES   . PERIPHERAL ARTERIAL STENT GRAFT  09/20/11   right SFA  . REFRACTIVE SURGERY  ` 2004   "for glaucoma"  . TONSILLECTOMY  1968  . TRIGGER FINGER RELEASE Left 12/04/2015  Procedure: LEFT RING FINGER TRIGGER RELEASE;  Surgeon: Leanora Cover, MD;  Location: [facility];  Service: Orthopedics;  Laterality: Left;  Marland Kitchen VAGINAL HYSTERECTOMY  1985   Fibroids    MEDICATIONS: Current Outpatient Medications on File Prior to Visit  Medication Sig Dispense Refill  . alendronate (FOSAMAX) 10 MG tablet Take 1 tablet (10 mg total) by mouth daily before breakfast. Take with a full glass of water on an empty stomach. 30 tablet 11  . ascorbic acid (VITAMIN C) 500 MG tablet Take 500 mg by mouth as needed.     Marland Kitchen aspirin EC 81 MG tablet Take 1 tablet (81 mg total) by mouth daily.    . busPIRone (BUSPAR) 7.5 MG tablet TAKE ONE TABLET BY MOUTH TWICE A DAY AS NEEDED FOR ANXIETY 42 tablet 1  . calcium-vitamin D (CALCIUM 500+D) 500-200 MG-UNIT per tablet Take 1 tablet by mouth 2 (two) times daily.     . ferrous sulfate 325 (65 FE) MG tablet Take 1 tablet (325 mg total) by mouth daily. 30 tablet 0  . folic acid (FOLVITE) 1 MG tablet Take 1 mg by mouth daily.    Marland Kitchen HYDROcodone-homatropine (HYCODAN) 5-1.5 MG/5ML syrup Take 5 mLs by mouth every 6 (six) hours as needed. 100 mL 0  . latanoprost (XALATAN) 0.005 % ophthalmic solution PLACE 1  DROP INTO BOTH EYES AT BEDTIME. 2.5 mL 2  . metFORMIN (GLUCOPHAGE) 1000 MG tablet Take 1,000 mg by mouth daily with breakfast.     . metoprolol succinate (TOPROL-XL) 50 MG 24 hr tablet TAKE ONE TABLET BY MOUTH DAILY 90 tablet 0  . pantoprazole (PROTONIX) 40 MG tablet Take 40 mg by mouth as needed.    . potassium chloride SA (K-DUR) 20 MEQ tablet Take 20 mEq by mouth 3 (three) times daily.    . rosuvastatin (CRESTOR) 20 MG tablet Take 1 tablet (20 mg total) by mouth daily. 90 tablet 3  . torsemide (DEMADEX) 20 MG tablet TAKE THREE TABLETS BY MOUTH EVERY MORNING AND TAKE TWO TABLETS BY MOUTH EVERY EVENING 450 tablet 0  . traZODone (DESYREL) 50 MG tablet TAKE 1/2 TO 1 TABLET BY MOUTH EVERY NIGHT AT BEDTIME AS NEEDED FOR SLEEP 16 tablet 0  . warfarin (COUMADIN) 3 MG tablet Take 1/2 to 1 tablet by mouth daily as directed 90 tablet 0   No current facility-administered medications on file prior to visit.    ALLERGIES: Allergies  Allergen Reactions  . Atorvastatin     Muscle pain  . Simvastatin Other (See Comments)    Muscle pain  . Sulfa Antibiotics     unknown  . Iodinated Diagnostic Agents Rash     Red rash after cardiac cath 1 wk ago, ? Contrast allergy, requires 13 hr prep now per [email or web address]    FAMILY HISTORY: Family History  Problem Relation Age of Onset  . Colon cancer Mother   . COPD Mother   . Emphysema Mother   . Cancer Maternal Grandmother   . Cancer Maternal Grandfather   . Heart disease Brother   . Diabetes Neg Hx     SOCIAL HISTORY: Social History   Socioeconomic History  . Marital status: Married    Spouse name: [name]  . Number of children: 2  . Years of education: 69  . Highest education level: Not on file  Occupational History    Comment: hair dresser  Tobacco Use  . Smoking status: Never Smoker  . Smokeless tobacco: Never Used  Substance and  Sexual Activity  . Alcohol use: No  . Drug use: No  . Sexual activity: Never  Other Topics  Concern  . Not on file  Social History Narrative   Pt is a high school graduate with 2 years of college. Married in 1967 she has 1 son born 55 and 1 daughter born 27 and 1 grandchild. 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      Caffeine use- 2-3 /day, soda, tea    Social Determinants of Health   Financial Resource Strain:   . Difficulty of Paying Living Expenses:   Food Insecurity:   . Worried About Charity fundraiser in the Last Year:   . Arboriculturist in the Last Year:   Transportation Needs:   . Film/video editor (Medical):   Marland Kitchen Lack of Transportation (Non-Medical):   Physical Activity:   . Days of Exercise per Week:   . Minutes of Exercise per Session:   Stress:   . Feeling of Stress :   Social Connections:   . Frequency of Communication with Friends and Family:   . Frequency of Social Gatherings with Friends and Family:   . Attends Religious Services:   . Active Member of Clubs or Organizations:   . Attends Archivist Meetings:   Marland Kitchen Marital Status:   Intimate Partner Violence:   . Fear of Current or Ex-Partner:   . Emotionally Abused:   Marland Kitchen Physically Abused:   . Sexually Abused:       PHYSICAL EXAM: Blood pressure 113/65, pulse 63, height _0  (1.575 m), weight 172 lb (78 kg), SpO2 99 %. General: No acute distress.  Patient appears well-groomed.   Head:  Normocephalic/atraumatic Eyes:  fundi examined but not visualized Neck: supple, no paraspinal tenderness, full range of motion Back: No paraspinal tenderness Heart: regular rate and rhythm Lungs: Clear to auscultation bilaterally. Vascular: No carotid bruits. Neurological Exam: Mental status: alert and oriented [employer] Mental Exam 09/28/2019  Weekday Correct 1  Current year 1  What state are we in? 1  Amount spent 1  Amount left 2  # of Animals 3  5 objects recall 3  Number series 2  Hour markers 2  Time correct 2  Placed X in triangle correctly 1   Largest Figure 1  Name of female 0  Date back to work 2  Type of work 2  State she lived in 2  Total score 26   Cranial nerves: CN I: not tested CN II: pupils equal, round and reactive to light, visual fields intact CN III, IV, VI:  full range of motion, no nystagmus, no ptosis CN V: facial sensation intact CN VII: upper and lower face symmetric CN VIII: hearing intact CN IX, X: gag intact, uvula midline CN XI: sternocleidomastoid and trapezius muscles intact CN XII: tongue midline Bulk & Tone: normal, no fasciculations. Motor:  5/5 throughout  Sensation: pinprick sensation reduced in feet; vibration sensation intact.  Deep Tendon Reflexes:  2+ throughout, toes downgoing.   Finger to nose testing:  Without dysmetria.   Heel to shin:  Without dysmetria.   Gait:  Mildly wide-based.  Romberg with sway  IMPRESSION: Memory deficits.  I do not appreciate any organic cognitive impairment.  She scored a 26/30 on the SLUMS which is borderline for mild neurocognitive disorder, however she endorsed problems with hearing the person conducting the test (didn't ear that the woman's name was Sharee Pimple).  I think her ongoing depression and anxiety related to family stressors as well as her chronic pain is a contributing factors.  While she does have some short term memory problems, she has been completely functional and independent.  I would focus management on treating her depression and anxiety.  If she feels that her memory is worsening, she is encouraged to follow up for re-evaluation.   Thank you for allowing me to take part in the care of this patient.  Metta Clines, DO  CC: [name], PA-C

## 2019-09-27 NOTE — Therapy (Addendum)
[place], [place], [address] Phone: [phone]   Fax:  [phone]  Physical Therapy Evaluation / Discharge  Patient Details  Name: Abigail Oliver MRN: [id number] Date of Birth: 1945-08-14 Referring Provider (PT): Delrae Rend, MD   Encounter Date: 09/27/2019  PT End of Session - 09/27/19 9485    Visit Number  1    Number of Visits  6    Date for PT Re-Evaluation  11/08/19    Authorization Type  MCR A/B    Progress Note Due on Visit  10    PT Start Time  0830    PT Stop Time  0912    PT Time Calculation (min)  42 min    Equipment Utilized During Treatment  Gait belt    Activity Tolerance  Patient tolerated treatment well    Behavior During Therapy  [facility] for tasks assessed/performed       Past Medical History:  Diagnosis Date  . Anemia   . Anxiety   . Aortic stenosis    a. Now has St Jude mechanical aortic valve (6/00).b. Echo (6/15) with EF 60-65%, mechanical aortic valve with mean gradient 27 mmHg.  . Arthritis    a. Possible c-spine arthritis with pain down left arm.   . Carotid artery disease ([place])    a. Carotid dopplers (7/16) with 40-59% BICA stenosis.   . Chronic angle-closure glaucoma(365.23)   . Chronic diastolic CHF (congestive heart failure) ([place])    a.  RHC (7/15) with mean RA 2, PA 22/11, mean PCWP 9, CI 3.79.  Marland Kitchen Colon polyps   . Contrast media allergy   . Coronary atherosclerosis of native coronary artery    a. CABG at time of AVR in 6/00 with RIMA-RCA.b. Abnl nuc 11/2013 -> LHC (7/15) with patent RIMA-RCA, 80% mRCA, 80% pLAD with FFR 0.73, treated with DES to pLAD.  Marland Kitchen Depression   . Dizziness 02/09/2012   a. Holter (8/14) with few PACs, otherwise unremarkable.   . Elevated transaminase level   . Essential hypertension   . Gastritis   . GERD (gastroesophageal reflux disease)   . Hyperlipidemia   . Low back pain   . Mechanical heart valve present   . Neuromuscular  disorder (HCC)    neuropathy  . Peripheral vascular disease ([place])    a, H/o Right SFA stent. b. Peripheral arterial dopplers (7/16) with right SFA stent patent.   Marland Kitchen PONV (postoperative nausea and vomiting)    N&V  . Pulmonary nodules    a. Noted by CT in 6/15. CT chest (8/15) was thought to indicate that nodules were benign. No further workup was recommended.   . Syncope 03/29/2012   carotid doppler - R ICA 50-69% reduction by velocities (low end); L ICA 0-49% reduction by velocities (low end); R and L subclavian arteries - <50% redcution; R and L vertebral arteries show normal antegrade flow  . Type II diabetes mellitus ([place])     Past Surgical History:  Procedure Laterality Date  . BILATERAL OOPHORECTOMY  1987  . BREAST BIOPSY  2012   left  . CARDIAC CATHETERIZATION  01/14/1999   normal LV function, severe aortic stenosis; 80% and 70% stenosis in RCA; mild 20% distal norrowing in L main with 20% proximal LAD stenosis, 40% diagonal stenosis and 20% proximal circumflex stenosis  . CARDIAC VALVE REPLACEMENT  2000   aortic valve   . CARDIOVASCULAR STRESS TEST  08/25/2011   R/L MV -  EF 72%; no scintigraphic evidence of inducible MI; normal perfusionTID of 1.25 elevated - could indicate small vessle subendocardial ischemia; EKG NSR at 66, non diagnostic for ischemia  . [place]   bilaterally  . CATARACT EXTRACTION W/ INTRAOCULAR LENS  IMPLANT, BILATERAL  ~ 2007  . [place]; 1971  . CHOLECYSTECTOMY  1990  . COLONOSCOPY N/A 10/27/2012   Procedure: COLONOSCOPY;  Surgeon: Beryle Beams, MD;  Location: [facility];  Service: Endoscopy;  Laterality: N/A;  . CORONARY ARTERY BYPASS GRAFT  2000   RIMA to RCA  . DOPPLER ECHOCARDIOGRAPHY  03/29/2012   EF >55%; mild concentric LVH; stage 1 diastolic dysfunction, elevated LV filling pressure, dilated LA; MAC mild MR; St Jude AVR peak and mean gradients of 88mHg and 275mg; transvalvular gradients have increased (prev  23 and 14 respectively)  . ESOPHAGOGASTRODUODENOSCOPY N/A 10/27/2012   Procedure: ESOPHAGOGASTRODUODENOSCOPY (EGD);  Surgeon: PaBeryle BeamsMD;  [facility];  Service: Endoscopy;  Laterality: N/A;  . FEMORAL ARTERY STENT  09/20/2011   6 x 40 Smart Nitinol self-expanding stent placed;  10/15/2031 -R SFA stent open and patent w/o evidence of restenosis  . FRACTIONAL FLOW RESERVE WIRE  12/14/2013   Procedure: FRACTIONAL FLOW RESERVE WIRE;  Surgeon: DaLarey DresserMD;  [facility];  Service: Cardiovascular;;  . LACERATION REPAIR     right hand  . LEFT AND RIGHT HEART CATHETERIZATION WITH CORONARY ANGIOGRAM N/A 12/14/2013   Procedure: LEFT AND RIGHT HEART CATHETERIZATION WITH CORONARY ANGIOGRAM;  Surgeon: DaLarey DresserMD;  [facility];  Service: Cardiovascular;  Laterality: N/A;  . LOWER EXTREMITY ANGIOGRAM N/A 09/20/2011   Procedure: LOWER EXTREMITY ANGIOGRAM;  Surgeon: JoLorretta HarpMD;  [facility];  Service: Cardiovascular;  Laterality: N/A;  . LYMPH NODE BIOPSY  06/2011   "core needle on 5"  . needle biopsy  2009   "on ankles for nerve damage"  . NEUROPLASTY / TRANSPOSITION ULNAR NERVE AT ELBOW     right  . PERCUTANEOUS CORONARY STENT INTERVENTION (PCI-S)  12/14/2013   Procedure: PERCUTANEOUS CORONARY STENT INTERVENTION (PCI-S);  Surgeon: DaLarey DresserMD;  [facility];  Service: Cardiovascular;;  Prox LAD 3.00x12 Promus DES   . PERIPHERAL ARTERIAL STENT GRAFT  09/20/11   right SFA  . REFRACTIVE SURGERY  ` 2004   "for glaucoma"  . TONSILLECTOMY  1968  . TRIGGER FINGER RELEASE Left 12/04/2015   Procedure: LEFT RING FINGER TRIGGER RELEASE;  Surgeon: KeLeanora CoverMD;  [facility] Service: Orthopedics;  Laterality: Left;  . Marland KitchenAGINAL HYSTERECTOMY  1985   Fibroids    There were no vitals filed for this visit.   Subjective Assessment - 09/27/19 0831    Subjective  Patient reports her walking and gait is off  because of right sided sciatic issues and diabetic neuropathy of both feet. This has been going on for 10+ years. Patient reports she falls frequently and is also dizzy a lot which is sometimes due to her blood surgar.    Pertinent History  DM, neuropathy, chroninc LBP, osteoporosis, depression and anxiety, CABG in 2000    Limitations  Standing;Walking;House hold activities    How long can you sit comfortably?  No limitation based on type of chair    How long can you stand comfortably?  3-4 minutes    How long can you walk comfortably?  3-4 minutes    Diagnostic  tests  MRI, X-ray    Patient Stated Goals  Get balance better so she feels more comfortable walking    Currently in Pain?  Yes    Pain Score  3     Pain Location  Back    Pain Orientation  Lower    Pain Descriptors / Indicators  Aching    Pain Type  Chronic pain    Pain Radiating Towards  right hip    Pain Onset  More than a month ago    Pain Frequency  Constant    Aggravating Factors   Standing for extended periods    Pain Relieving Factors  Sometimes sitting helps    Effect of Pain on Daily Activities  Patient is limited walking for extended periods         [facility] PT Assessment - 09/27/19 0001      Assessment   Medical Diagnosis  Repeated falls    Referring Provider (PT)  Delrae Rend, MD    Onset Date/Surgical Date  --   >10 years ago per patient   Hand Dominance  Right    Next MD Visit  Not scheduled    Prior Therapy  Yes      Precautions   Precautions  Fall      Restrictions   Weight Bearing Restrictions  No      Balance Screen   Has the patient fallen in the past 6 months  No   has fallen 4 times in the past 2 years while walking   Has the patient had a decrease in activity level because of a fear of falling?   Yes    Is the patient reluctant to leave their home because of a fear of falling?   Yes      [place]  Private residence    Living Arrangements  Spouse/significant  other    Type of [place] to enter    Entrance Stairs-Number of Steps  [place]  One level      Prior Function   Level of Independence  Independent    Vocation  Retired    Radio producer, Multimedia programmer, cooking and Engineer, manufacturing systems   Overall Cognitive Status  Within Functional Limits for tasks assessed      Observation/Other Assessments   Observations  Patient appears in no apparent distress    Focus on Therapeutic Outcomes (FOTO)   NA      Sensation   Light Touch  Impaired by gross assessment    Additional Comments  bilateral feet and lower legs secondary to diabetic neuropathy      Posture/Postural Control   Posture Comments  Patient exhibits rounded shoulder and forward head posture      ROM / Strength   AROM / PROM / Strength  AROM;Strength      AROM   Overall AROM Comments  Patient exhibit grossly lumbar AROM limitation with increased pain in all directions      Strength   Strength Assessment Site  Hip;Knee;Ankle    Right/Left Hip  Right;Left    Right Hip Flexion  4-/5    Right Hip Extension  3+/5    Right Hip ABduction  3+/5    Left Hip Flexion  4-/5    Left Hip Extension  3+/5    Left Hip ABduction  3+/5    Right/Left Knee  Right;Left    Right Knee Flexion  4/5    Right Knee Extension  4/5    Left Knee Flexion  4/5    Left Knee Extension  4/5    Right/Left Ankle  Right;Left    Right Ankle Dorsiflexion  4/5    Right Ankle Plantar Flexion  4/5    Left Ankle Dorsiflexion  4/5    Left Ankle Plantar Flexion  4/5      Flexibility   Soft Tissue Assessment /Muscle Length  yes    Hamstrings  Limited    Piriformis  Limited      Palpation   Palpation comment  Not assessed      Special Tests   Other special tests  Not assessed      Ambulation/Gait   Ambulation/Gait  Yes    Ambulation/Gait Assistance  6: Modified independent (Device/Increase time)    Gait Comments  Antalgic gait on right,  slightly unsteady, wider BOS, trendelenburg      Balance   Balance Assessed  Yes      Standardized Balance Assessment   Standardized Balance Assessment  Berg Balance Test      Berg Balance Test   Sit to Stand  Able to stand  independently using hands    Standing Unsupported  Able to stand safely 2 minutes    Sitting with Back Unsupported but Feet Supported on Floor or Stool  Able to sit safely and securely 2 minutes    Stand to Sit  Controls descent by using hands    Transfers  Able to transfer safely, definite need of hands    Standing Unsupported with Eyes Closed  Able to stand 10 seconds with supervision    Standing Unsupported with Feet Together  Able to place feet together independently and stand for 1 minute with supervision    From Standing, Reach Forward with Outstretched Arm  Can reach forward >12 cm safely (5")    From Standing Position, Pick up Object from Floor  Able to pick up shoe safely and easily    From Standing Position, Turn to Look Behind Over each Shoulder  Looks behind from both sides and weight shifts well    Turn 360 Degrees  Able to turn 360 degrees safely in 4 seconds or less    Standing Unsupported, Alternately Place Feet on Step/Stool  Able to stand independently and complete 8 steps >20 seconds    Standing Unsupported, One Foot in [employer] balance while stepping or standing    Standing on One Leg  Tries to lift leg/unable to hold 3 seconds but remains standing independently    Total Score  42                Objective measurements completed on examination: See above findings.      [facility] PT Treatment/Exercise - 09/27/19 0001      Exercises   Exercises  Knee/Hip      Knee/Hip Exercises: Stretches   Piriformis Stretch  20 seconds    Other Knee/Hip Stretches  LTR 5 sex hold x10    Other Knee/Hip Stretches  SKTC stretch 20 sec      Knee/Hip Exercises: Standing   Heel Raises  10 reps    Hip Flexion  10 reps    Hip Flexion  Limitations  marching    Hip Abduction  10 reps    Hip Extension  10 reps  Functional Squat  10 reps    Functional Squat Limitations  partial range with counter support             PT Education - 09/27/19 0921    Education Details  Exam findings, fall risk, POC, HEP    Person(s) Educated  Patient    Methods  Explanation;Demonstration;Tactile cues;Verbal cues;Handout    Comprehension  Verbalized understanding;Returned demonstration;Verbal cues required;Tactile cues required;Need further instruction       PT Short Term Goals - 09/27/19 0931      PT SHORT TERM GOAL #1   Title  Patient will be I with final HEP and progressions to maintain improvement from PT    Time  6    Period  Weeks    Status  New    Target Date  11/08/19      PT SHORT TERM GOAL #2   Title  Patient will exhibit improved balance to >/= 50 on BERG balance scale to indicate reduced fall risk    Time  6    Period  Weeks    Status  New    Target Date  11/08/19      PT SHORT TERM GOAL #3   Title  Patient will be able to ambulate >/= 20 minutes to improve grovery shopping ability    Time  6    Period  Weeks    Status  New    Target Date  11/08/19      PT SHORT TERM GOAL #4   Title  Patient will exhibit improved strength of BLE by performing sit<>standng without UE support    Time  6    Period  Weeks    Status  New    Target Date  11/08/19      PT SHORT TERM GOAL #5   Title  Patient will report no falls since start of therapy to improve safety with mobility    Time  6    Period  Weeks    Status  New    Target Date  11/08/19        PT Long Term Goals - 09/27/19 1353      PT LONG TERM GOAL #1   Title  STG=LTG             Plan - 09/27/19 1339    Clinical Impression Statement  Patient presents to PT with report balance deficits with history of falls and low back pain with right sided sciatica. Patient does exhibit a fall risk based on BERG balance scale, strength deficit of BLEs,  impaired sensation of bilateral lower legs, and lower back and right hip pain that affect gait walking duration. She was provided with exercises to initiate stretching for the lower back and strengthening for BLEs. She would benefit from continued skilled PT to progress strength and balance in order to improve walking ability and reduce risk of future falls.    Personal Factors and Comorbidities  Age;Fitness;Past/Current Experience;Time since onset of injury/illness/exacerbation;Comorbidity 3+    Comorbidities  DM, neuropathy, chroninc LBP, osteoporosis, depression and anxiety, CABG in 2000    Examination-Activity Limitations  Locomotion Level;Stand;Transfers;Lift;Bend    Examination-Participation Restrictions  Meal Prep;Cleaning;Community Activity;Shop    Stability/Clinical Decision Making  Evolving/Moderate complexity    Clinical Decision Making  Moderate    Rehab Potential  Good    PT Frequency  1x / week    PT Duration  6 weeks    PT Treatment/Interventions  ADLs/Self Care Home Management;Cryotherapy;Dealer  Stimulation;Moist Heat;Neuromuscular re-education;Balance training;Therapeutic exercise;Therapeutic activities;Functional mobility training;Stair training;Gait training;Patient/family education;Manual techniques;Dry needling;Passive range of motion;Spinal Manipulations;Joint Manipulations    PT Next Visit Plan  Assess HEP and progress PRN, Nustep, lumbar and hip stretching, progress general LE strengthening, balance training    PT Home Exercise Plan  [id number]: supine LTR, SKTC, piriformis stretch; standiing hip abduction, extension, marching, mini-squat, and heel raises perform at counter    Consulted and Agree with Plan of Care  Patient       Patient will benefit from skilled therapeutic intervention in order to improve the following deficits and impairments:  Abnormal gait, Decreased range of motion, Difficulty walking, Decreased activity tolerance, Cardiopulmonary status limiting  activity, Pain, Decreased balance, Postural dysfunction, Decreased strength, Impaired sensation  Visit Diagnosis: Repeated falls  Muscle weakness (generalized)  Chronic bilateral low back pain, unspecified whether sciatica present     Problem List Patient Active Problem List   Diagnosis Date Noted  . Benign paroxysmal positional vertigo 07/14/2015  . Dizziness and giddiness 07/14/2015  . Diabetic polyneuropathy associated with type 2 diabetes mellitus ([place]) 07/14/2015  . Spinal stenosis, lumbar region, without neurogenic claudication 07/14/2015  . Cervical radiculopathy 07/14/2015  . Chest pain 07/07/2015  . Angina pectoris ([place]) 07/07/2015  . Dizziness 05/14/2015  . Left-sided weakness 02/23/2015  . Acute sinusitis 01/29/2015  . Cough 08/29/2014  . Cerumen impaction 03/28/2014  . S/P arterial stent-to LAD, Promus DES 12/14/13 12/15/2013  . Pulmonary nodules/lesions, multiple 11/19/2013  . Rotator cuff syndrome of left shoulder 05/28/2013  . Chronic diastolic heart failure ([place]) 01/02/2013  . Anemia, iron deficiency 11/05/2012  . Dyslipidemia 09/21/2011  . CAD, CABG 2000, low risk Myoview April 2011; 2015 + myoview 09/21/2011  . Diastolic dysfunction with NL LVF 2D August 2012 09/21/2011  . Claudication ([place]) 09/21/2011  . PVD, Rt SFA PTA/Stent 09/20/11 09/21/2011  . Nodule of neck 07/01/2011  . MIGRAINE, OPHTHALMIC 12/16/2009  . DEPRESSION, MAJOR, RECURRENT, MODERATE 12/15/2009  . OSTEOARTHRITIS, GENERALIZED, MULTIPLE JOINTS 12/30/2008  . Type II diabetes mellitus with peripheral autonomic neuropathy ([place]) 07/01/2008  . Chronic angle-closure glaucoma(365.23) 07/01/2008  . DISPLCMT LUMBAR INTERVERT [place] W/O MYELOPATHY 07/01/2008  . Calcaneal spur 07/01/2008  . OSTEOPOROSIS 07/01/2008    Hilda Blades, PT, DPT, LAT, ATC 09/27/19  3:30 PM Phone: [phone] Fax: [facility] [place], [place], [address] Phone: [phone]   Fax:  [phone]  Name: Abigail Oliver MRN: [id number] Date of Birth: 1946-05-19    PHYSICAL THERAPY DISCHARGE SUMMARY  Visits from Start of Care: 1  Current functional level related to goals / functional outcomes: See above   Remaining deficits: See above   Education / Equipment: HEP Plan: Patient agrees to discharge.  Patient goals were not met. Patient is being discharged due to not returning since the last visit.  ?????    Hilda Blades, PT, DPT, LAT, ATC 10/31/19  9:06 AM Phone: [phone] Fax: [phone]

## 2019-09-28 ENCOUNTER — Encounter: Payer: Self-pay | Admitting: Neurology

## 2019-09-28 ENCOUNTER — Ambulatory Visit (INDEPENDENT_AMBULATORY_CARE_PROVIDER_SITE_OTHER): Payer: Medicare Other | Admitting: Neurology

## 2019-09-28 VITALS — BP 113/65 | HR 63 | Ht 62.0 in | Wt 172.0 lb

## 2019-09-28 DIAGNOSIS — R413 Other amnesia: Secondary | ICD-10-CM

## 2019-09-28 DIAGNOSIS — F4323 Adjustment disorder with mixed anxiety and depressed mood: Secondary | ICD-10-CM

## 2019-09-28 NOTE — Patient Instructions (Signed)
You don't have dementia.  I don't think you have cognitive impairment either.  I think the anxiety, depression, hearing loss and chronic pain is affecting you.  However, if you notice any worsening memory, please return for re-evaluation.

## 2019-10-05 DIAGNOSIS — H903 Sensorineural hearing loss, bilateral: Secondary | ICD-10-CM | POA: Diagnosis not present

## 2019-10-08 ENCOUNTER — Ambulatory Visit (INDEPENDENT_AMBULATORY_CARE_PROVIDER_SITE_OTHER): Payer: Medicare Other | Admitting: Pharmacist

## 2019-10-08 ENCOUNTER — Other Ambulatory Visit: Payer: Self-pay

## 2019-10-08 DIAGNOSIS — Z7901 Long term (current) use of anticoagulants: Secondary | ICD-10-CM

## 2019-10-08 DIAGNOSIS — Z952 Presence of prosthetic heart valve: Secondary | ICD-10-CM

## 2019-10-08 LAB — POCT INR: INR: 2.9 (ref 2.0–3.0)

## 2019-10-08 NOTE — Patient Instructions (Signed)
Description    Continue with 1 tablet daily except 1/2 tablet each Tuesday, Thursday and Saturday.  Repeat INR in 4 weeks (dose correct per patient)

## 2019-10-09 ENCOUNTER — Ambulatory Visit: Payer: Medicare Other | Admitting: Physical Therapy

## 2019-10-12 ENCOUNTER — Ambulatory Visit: Payer: Medicare Other | Admitting: Medical

## 2019-10-16 ENCOUNTER — Ambulatory Visit: Payer: Medicare Other | Admitting: Physical Therapy

## 2019-10-18 ENCOUNTER — Other Ambulatory Visit (HOSPITAL_COMMUNITY): Payer: Self-pay | Admitting: Cardiology

## 2019-10-23 ENCOUNTER — Ambulatory Visit: Payer: Medicare Other | Admitting: Physical Therapy

## 2019-10-30 ENCOUNTER — Encounter: Payer: Medicare Other | Admitting: Physical Therapy

## 2019-10-31 ENCOUNTER — Other Ambulatory Visit: Payer: Self-pay | Admitting: Medical

## 2019-11-04 ENCOUNTER — Other Ambulatory Visit: Payer: Self-pay | Admitting: Medical

## 2019-11-05 ENCOUNTER — Other Ambulatory Visit: Payer: Self-pay

## 2019-11-05 ENCOUNTER — Ambulatory Visit (INDEPENDENT_AMBULATORY_CARE_PROVIDER_SITE_OTHER): Payer: Medicare Other | Admitting: Pharmacist

## 2019-11-05 DIAGNOSIS — Z952 Presence of prosthetic heart valve: Secondary | ICD-10-CM

## 2019-11-05 DIAGNOSIS — Z7901 Long term (current) use of anticoagulants: Secondary | ICD-10-CM

## 2019-11-05 LAB — POCT INR: INR: 2.8 (ref 2.0–3.0)

## 2019-11-06 ENCOUNTER — Encounter: Payer: Medicare Other | Admitting: Physical Therapy

## 2019-11-16 ENCOUNTER — Other Ambulatory Visit (HOSPITAL_COMMUNITY): Payer: Self-pay | Admitting: Internal Medicine

## 2019-11-18 ENCOUNTER — Other Ambulatory Visit (HOSPITAL_COMMUNITY): Payer: Self-pay | Admitting: Cardiology

## 2019-11-29 ENCOUNTER — Telehealth: Payer: Self-pay | Admitting: Medical

## 2019-11-29 NOTE — Telephone Encounter (Signed)
Patient states she wants to see orthopedic doctor for her left knee pain and still swollen and would like to see Dr. in [place] .

## 2019-11-29 NOTE — Telephone Encounter (Signed)
Patient states needs a referral to a orthopedic doctor.  Please Advise

## 2019-11-30 ENCOUNTER — Telehealth: Payer: Self-pay | Admitting: Medical

## 2019-11-30 DIAGNOSIS — M25569 Pain in unspecified knee: Secondary | ICD-10-CM

## 2019-11-30 NOTE — Telephone Encounter (Signed)
Sherri - looks liked you faxed via ROI to [facility]. They are in [place] if you would like to upload documentation too. Thanks.

## 2019-11-30 NOTE — Telephone Encounter (Signed)
Uploaded done

## 2019-11-30 NOTE — Telephone Encounter (Signed)
Referred to ortho. Coordinate with pt on which orthopedist she wants to see.

## 2019-12-10 ENCOUNTER — Other Ambulatory Visit: Payer: Self-pay

## 2019-12-10 ENCOUNTER — Ambulatory Visit (INDEPENDENT_AMBULATORY_CARE_PROVIDER_SITE_OTHER): Payer: Medicare Other | Admitting: Pharmacist

## 2019-12-10 DIAGNOSIS — Z7901 Long term (current) use of anticoagulants: Secondary | ICD-10-CM

## 2019-12-10 DIAGNOSIS — Z952 Presence of prosthetic heart valve: Secondary | ICD-10-CM | POA: Diagnosis not present

## 2019-12-10 DIAGNOSIS — M25562 Pain in left knee: Secondary | ICD-10-CM | POA: Diagnosis not present

## 2019-12-10 LAB — POCT INR: INR: 2.3 (ref 2.0–3.0)

## 2019-12-13 DIAGNOSIS — R928 Other abnormal and inconclusive findings on diagnostic imaging of breast: Secondary | ICD-10-CM | POA: Diagnosis not present

## 2019-12-16 ENCOUNTER — Other Ambulatory Visit: Payer: Self-pay | Admitting: Medical

## 2019-12-16 ENCOUNTER — Other Ambulatory Visit: Payer: Self-pay | Admitting: Cardiology

## 2019-12-17 ENCOUNTER — Other Ambulatory Visit: Payer: Self-pay

## 2019-12-17 DIAGNOSIS — H401131 Primary open-angle glaucoma, bilateral, mild stage: Secondary | ICD-10-CM | POA: Diagnosis not present

## 2019-12-17 MED ORDER — BUSPIRONE HCL 7.5 MG PO TABS: ORAL_TABLET | ORAL | 1 refills | Status: DC

## 2019-12-22 ENCOUNTER — Other Ambulatory Visit (HOSPITAL_COMMUNITY): Payer: Self-pay | Admitting: Internal Medicine

## 2019-12-25 ENCOUNTER — Other Ambulatory Visit (HOSPITAL_COMMUNITY): Payer: Self-pay | Admitting: Internal Medicine

## 2020-01-07 ENCOUNTER — Other Ambulatory Visit: Payer: Self-pay

## 2020-01-07 ENCOUNTER — Ambulatory Visit (INDEPENDENT_AMBULATORY_CARE_PROVIDER_SITE_OTHER): Payer: Medicare Other

## 2020-01-07 DIAGNOSIS — Z952 Presence of prosthetic heart valve: Secondary | ICD-10-CM | POA: Diagnosis not present

## 2020-01-07 DIAGNOSIS — Z7901 Long term (current) use of anticoagulants: Secondary | ICD-10-CM | POA: Diagnosis not present

## 2020-01-07 LAB — POCT INR: INR: 1.8 — AB (ref 2.0–3.0)

## 2020-01-07 NOTE — Patient Instructions (Signed)
Take 2 tablets today and then Continue with 1 tablet daily except 1/2 tablet each Tuesday, Thursday and Saturday.  Repeat INR in 2 weeks

## 2020-01-21 ENCOUNTER — Other Ambulatory Visit: Payer: Self-pay | Admitting: Medical

## 2020-01-21 ENCOUNTER — Ambulatory Visit (INDEPENDENT_AMBULATORY_CARE_PROVIDER_SITE_OTHER): Payer: Medicare Other

## 2020-01-21 ENCOUNTER — Other Ambulatory Visit: Payer: Self-pay

## 2020-01-21 DIAGNOSIS — Z7901 Long term (current) use of anticoagulants: Secondary | ICD-10-CM | POA: Diagnosis not present

## 2020-01-21 DIAGNOSIS — Z952 Presence of prosthetic heart valve: Secondary | ICD-10-CM | POA: Diagnosis not present

## 2020-01-21 LAB — POCT INR: INR: 2.6 (ref 2.0–3.0)

## 2020-01-21 NOTE — Patient Instructions (Signed)
Continue with 1 tablet daily except 1/2 tablet each Tuesday, Thursday and Saturday.  Repeat INR in 4 weeks

## 2020-01-22 ENCOUNTER — Other Ambulatory Visit: Payer: Self-pay | Admitting: Medical

## 2020-01-24 ENCOUNTER — Other Ambulatory Visit: Payer: Self-pay | Admitting: Cardiology

## 2020-02-04 ENCOUNTER — Other Ambulatory Visit (HOSPITAL_COMMUNITY): Payer: Self-pay | Admitting: Cardiovascular Disease

## 2020-02-04 ENCOUNTER — Other Ambulatory Visit: Payer: Self-pay

## 2020-02-04 ENCOUNTER — Ambulatory Visit (HOSPITAL_COMMUNITY)
Admission: RE | Admit: 2020-02-04 | Discharge: 2020-02-04 | Disposition: A | Discharge status: Home / Self Care | Payer: Medicare Other | Source: Ambulatory Visit | Attending: Cardiology | Admitting: Cardiology

## 2020-02-04 DIAGNOSIS — I739 Peripheral vascular disease, unspecified: Secondary | ICD-10-CM | POA: Diagnosis not present

## 2020-02-18 ENCOUNTER — Other Ambulatory Visit: Payer: Self-pay

## 2020-02-18 ENCOUNTER — Ambulatory Visit (INDEPENDENT_AMBULATORY_CARE_PROVIDER_SITE_OTHER): Payer: Medicare Other

## 2020-02-18 DIAGNOSIS — Z7901 Long term (current) use of anticoagulants: Secondary | ICD-10-CM | POA: Diagnosis not present

## 2020-02-18 DIAGNOSIS — Z952 Presence of prosthetic heart valve: Secondary | ICD-10-CM | POA: Diagnosis not present

## 2020-02-18 LAB — POCT INR: INR: 2.6 (ref 2.0–3.0)

## 2020-02-18 NOTE — Patient Instructions (Signed)
Continue with 1 tablet daily except 1/2 tablet each Tuesday, Thursday and Saturday.  Repeat INR in 6 weeks.   

## 2020-02-19 ENCOUNTER — Other Ambulatory Visit: Payer: Self-pay

## 2020-02-19 DIAGNOSIS — I739 Peripheral vascular disease, unspecified: Secondary | ICD-10-CM

## 2020-02-19 NOTE — Progress Notes (Signed)
vas 

## 2020-02-21 ENCOUNTER — Other Ambulatory Visit: Payer: Self-pay | Admitting: Cardiology

## 2020-02-21 ENCOUNTER — Other Ambulatory Visit: Payer: Self-pay | Admitting: Medical

## 2020-03-24 ENCOUNTER — Other Ambulatory Visit: Payer: Self-pay | Admitting: Medical

## 2020-03-25 ENCOUNTER — Other Ambulatory Visit (HOSPITAL_BASED_OUTPATIENT_CLINIC_OR_DEPARTMENT_OTHER): Payer: Self-pay | Admitting: Internal Medicine

## 2020-03-25 ENCOUNTER — Other Ambulatory Visit: Payer: Self-pay

## 2020-03-25 ENCOUNTER — Ambulatory Visit (INDEPENDENT_AMBULATORY_CARE_PROVIDER_SITE_OTHER): Payer: Medicare Other | Admitting: Medical

## 2020-03-25 ENCOUNTER — Ambulatory Visit: Payer: Medicare Other | Attending: Internal Medicine

## 2020-03-25 VITALS — BP 121/44 | HR 60 | Ht 62.0 in | Wt 173.0 lb

## 2020-03-25 DIAGNOSIS — E1142 Type 2 diabetes mellitus with diabetic polyneuropathy: Secondary | ICD-10-CM | POA: Diagnosis not present

## 2020-03-25 DIAGNOSIS — E1169 Type 2 diabetes mellitus with other specified complication: Secondary | ICD-10-CM | POA: Diagnosis not present

## 2020-03-25 DIAGNOSIS — G8929 Other chronic pain: Secondary | ICD-10-CM

## 2020-03-25 DIAGNOSIS — I509 Heart failure, unspecified: Secondary | ICD-10-CM | POA: Diagnosis not present

## 2020-03-25 DIAGNOSIS — F32A Depression, unspecified: Secondary | ICD-10-CM | POA: Diagnosis not present

## 2020-03-25 DIAGNOSIS — M25559 Pain in unspecified hip: Secondary | ICD-10-CM | POA: Diagnosis not present

## 2020-03-25 DIAGNOSIS — R197 Diarrhea, unspecified: Secondary | ICD-10-CM | POA: Diagnosis not present

## 2020-03-25 DIAGNOSIS — M48061 Spinal stenosis, lumbar region without neurogenic claudication: Secondary | ICD-10-CM

## 2020-03-25 DIAGNOSIS — M810 Age-related osteoporosis without current pathological fracture: Secondary | ICD-10-CM | POA: Diagnosis not present

## 2020-03-25 DIAGNOSIS — Z79899 Other long term (current) drug therapy: Secondary | ICD-10-CM | POA: Diagnosis not present

## 2020-03-25 DIAGNOSIS — R944 Abnormal results of kidney function studies: Secondary | ICD-10-CM | POA: Diagnosis not present

## 2020-03-25 DIAGNOSIS — F419 Anxiety disorder, unspecified: Secondary | ICD-10-CM | POA: Diagnosis not present

## 2020-03-25 DIAGNOSIS — M5441 Lumbago with sciatica, right side: Secondary | ICD-10-CM

## 2020-03-25 DIAGNOSIS — E785 Hyperlipidemia, unspecified: Secondary | ICD-10-CM | POA: Diagnosis not present

## 2020-03-25 DIAGNOSIS — Z23 Encounter for immunization: Secondary | ICD-10-CM

## 2020-03-25 MED ORDER — DICYCLOMINE HCL 10 MG PO CAPS: 10.0000 mg | ORAL_CAPSULE | Freq: Three times a day (TID) | ORAL | 0 refills | Status: DC

## 2020-03-25 MED ORDER — HYDROCODONE-ACETAMINOPHEN 5-325 MG PO TABS: ORAL_TABLET | ORAL | 0 refills | Status: DC

## 2020-03-25 MED ORDER — BUSPIRONE HCL 7.5 MG PO TABS: 7.5000 mg | ORAL_TABLET | Freq: Three times a day (TID) | ORAL | 3 refills | Status: DC

## 2020-03-25 NOTE — Progress Notes (Signed)
   Covid-19 Vaccination Clinic  Name:  Abigail Oliver    MRN: [id number] DOB: 1945/07/04  03/25/2020  [name] was observed post Covid-19 immunization for 15 minutes without incident. She was provided with Vaccine Information Sheet and instruction to access the V-Safe system.   [name] was instructed to call 911 with any severe reactions post vaccine: Marland Kitchen Difficulty breathing  . Swelling of face and throat  . A fast heartbeat  . A bad rash all over body  . Dizziness and weakness

## 2020-03-25 NOTE — Progress Notes (Signed)
Subjective:    Patient ID: Abigail Oliver, female    DOB: [birth year], 74 y.o.   MRN: [id number]  HPI Pt has back and hip pain.  MRI of back march 2021. IMPRESSION: 1. L4-5 left-sided herniation has regressed from 2010 but there is progressive facet osteoarthritis with moderate spinal stenosis. There is asymmetric impingement on the left L5 nerve root in the subarticular recess. 2. Mild degenerative changes at the other levels is similar to prior  Hip xrays 06-26-19.  IMPRESSION: 1. Unremarkable pelvis and bilateral hips.  Patient states daily moderate to severe pain.  Pt did see pain specialist. She was not happy with appointment. Had painful diagnostic procedure. Pt states she want stimulator treatment. She states won't return to that office.  Daily constant pain. Intense pain down rt side event.  Pt in past gabapentin did not help.   Pt is diabetic. Pt seeing endocrinologist.  Pt has high cholesterol. Pt is crestor.   Pt has hx of chf. Has mechanical valve. On warfarin.on torsemide. Will see cardiologist before end of year.  Hx of ibs. 4-5 loose stools a day. Hx of ibs c.   Review of Systems  Constitutional: Negative for chills, fatigue and fever.  Respiratory: Negative for cough, chest tightness, shortness of breath and wheezing.   Cardiovascular: Negative for chest pain.  Gastrointestinal: Negative for abdominal pain, blood in stool, diarrhea, rectal pain and vomiting.  Genitourinary: Negative for dysuria, flank pain and frequency.  Musculoskeletal: Positive for back pain.       Also some hip pain.  Skin: Negative for rash.  Neurological: Negative for dizziness, syncope, numbness and headaches.  Hematological: Negative for adenopathy. Does not bruise/bleed easily.  Psychiatric/Behavioral: Negative for agitation and confusion.     Past Medical History:  Diagnosis Date  . Anemia   . Anxiety   . Aortic stenosis    a. Now has St Jude mechanical aortic  valve (6/00).b. Echo (6/15) with EF 60-65%, mechanical aortic valve with mean gradient 27 mmHg.  . Arthritis    a. Possible c-spine arthritis with pain down left arm.   . Carotid artery disease ([place])    a. Carotid dopplers (7/16) with 40-59% BICA stenosis.   . Chronic angle-closure glaucoma(365.23)   . Chronic diastolic CHF (congestive heart failure) ([place])    a.  RHC (7/15) with mean RA 2, PA 22/11, mean PCWP 9, CI 3.79.  Marland Kitchen Colon polyps   . Contrast media allergy   . Coronary atherosclerosis of native coronary artery    a. CABG at time of AVR in 6/00 with RIMA-RCA.b. Abnl nuc 11/2013 -> LHC (7/15) with patent RIMA-RCA, 80% mRCA, 80% pLAD with FFR 0.73, treated with DES to pLAD.  Marland Kitchen Depression   . Dizziness 02/09/2012   a. Holter (8/14) with few PACs, otherwise unremarkable.   . Elevated transaminase level   . Essential hypertension   . Gastritis   . GERD (gastroesophageal reflux disease)   . Hyperlipidemia   . Low back pain   . Mechanical heart valve present   . Neuromuscular disorder (H[facility])    neuropathy  . Peripheral vascular disease ([place])    a, H/o Right SFA stent. b. Peripheral arterial dopplers (7/16) with right SFA stent patent.   Marland Kitchen PONV (postoperative nausea and vomiting)    N&V  . Pulmonary nodules    a. Noted by CT in 6/15. CT chest (8/15) was thought to indicate that nodules were benign. No further workup was recommended.   Marland Kitchen  Syncope 03/29/2012   carotid doppler - R ICA 50-69% reduction by velocities (low end); L ICA 0-49% reduction by velocities (low end); R and L subclavian arteries - <50% redcution; R and L vertebral arteries show normal antegrade flow  . Type II diabetes mellitus ([place])      Social History   Socioeconomic History  . Marital status: Married    Spouse name: [name]  . Number of children: 2  . Years of education: 70  . Highest education level: Not on file  Occupational History    Comment: hair dresser  Tobacco Use  . Smoking status: Never  Smoker  . Smokeless tobacco: Never Used  Substance and Sexual Activity  . Alcohol use: No  . Drug use: No  . Sexual activity: Never  Other Topics Concern  . Not on file  Social History Narrative   Pt is a high school graduate with 2 years of college. Married in 1967 she has 1 son born 49 and 1 daughter born 2 and 1 grandchild. 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      Caffeine use- 2-3 /day, soda, tea       Lives with husband one story home   Social Determinants of Health   Financial Resource Strain:   . Difficulty of Paying Living Expenses: Not on file  Food Insecurity:   . Worried About Charity fundraiser in the Last Year: Not on file  . Ran Out of Food in the Last Year: Not on file  Transportation Needs:   . Lack of Transportation (Medical): Not on file  . Lack of Transportation (Non-Medical): Not on file  Physical Activity:   . Days of Exercise per Week: Not on file  . Minutes of Exercise per Session: Not on file  Stress:   . Feeling of Stress : Not on file  Social Connections:   . Frequency of Communication with Friends and Family: Not on file  . Frequency of Social Gatherings with Friends and Family: Not on file  . Attends Religious Services: Not on file  . Active Member of Clubs or Organizations: Not on file  . Attends Archivist Meetings: Not on file  . Marital Status: Not on file  Intimate Partner Violence:   . Fear of Current or Ex-Partner: Not on file  . Emotionally Abused: Not on file  . Physically Abused: Not on file  . Sexually Abused: Not on file    Past Surgical History:  Procedure Laterality Date  . BILATERAL OOPHORECTOMY  1987  . BREAST BIOPSY  2012   left  . CARDIAC CATHETERIZATION  01/14/1999   normal LV function, severe aortic stenosis; 80% and 70% stenosis in RCA; mild 20% distal norrowing in L main with 20% proximal LAD stenosis, 40% diagonal stenosis and 20% proximal circumflex stenosis  . CARDIAC  VALVE REPLACEMENT  2000   aortic valve   . CARDIOVASCULAR STRESS TEST  08/25/2011   R/L MV - EF 72%; no scintigraphic evidence of inducible MI; normal perfusionTID of 1.25 elevated - could indicate small vessle subendocardial ischemia; EKG NSR at 66, non diagnostic for ischemia  . [place]   bilaterally  . CATARACT EXTRACTION W/ INTRAOCULAR LENS  IMPLANT, BILATERAL  ~ 2007  . [place]; 1971  . CHOLECYSTECTOMY  1990  . COLONOSCOPY N/A 10/27/2012   Procedure: COLONOSCOPY;  Surgeon: Beryle Beams, MD;  Location: [facility];  Service:  Endoscopy;  Laterality: N/A;  . CORONARY ARTERY BYPASS GRAFT  2000   RIMA to RCA  . DOPPLER ECHOCARDIOGRAPHY  03/29/2012   EF >55%; mild concentric LVH; stage 1 diastolic dysfunction, elevated LV filling pressure, dilated LA; MAC mild MR; St Jude AVR peak and mean gradients of 22mHg and 297mg; transvalvular gradients have increased (prev 23 and 14 respectively)  . ESOPHAGOGASTRODUODENOSCOPY N/A 10/27/2012   Procedure: ESOPHAGOGASTRODUODENOSCOPY (EGD);  Surgeon: PaBeryle BeamsMD;  [facility];  Service: Endoscopy;  Laterality: N/A;  . FEMORAL ARTERY STENT  09/20/2011   6 x 40 Smart Nitinol self-expanding stent placed;  10/15/2031 -R SFA stent open and patent w/o evidence of restenosis  . FRACTIONAL FLOW RESERVE WIRE  12/14/2013   Procedure: FRACTIONAL FLOW RESERVE WIRE;  Surgeon: DaLarey DresserMD;  [facility];  Service: Cardiovascular;;  . LACERATION REPAIR     right hand  . LEFT AND RIGHT HEART CATHETERIZATION WITH CORONARY ANGIOGRAM N/A 12/14/2013   Procedure: LEFT AND RIGHT HEART CATHETERIZATION WITH CORONARY ANGIOGRAM;  Surgeon: DaLarey DresserMD;  [facility];  Service: Cardiovascular;  Laterality: N/A;  . LOWER EXTREMITY ANGIOGRAM N/A 09/20/2011   Procedure: LOWER EXTREMITY ANGIOGRAM;  Surgeon: JoLorretta HarpMD;  [facility];  Service: Cardiovascular;  Laterality: N/A;  . LYMPH  NODE BIOPSY  06/2011   "core needle on 5"  . needle biopsy  2009   "on ankles for nerve damage"  . NEUROPLASTY / TRANSPOSITION ULNAR NERVE AT ELBOW     right  . PERCUTANEOUS CORONARY STENT INTERVENTION (PCI-S)  12/14/2013   Procedure: PERCUTANEOUS CORONARY STENT INTERVENTION (PCI-S);  Surgeon: DaLarey DresserMD;  [facility];  Service: Cardiovascular;;  Prox LAD 3.00x12 Promus DES   . PERIPHERAL ARTERIAL STENT GRAFT  09/20/11   right SFA  . REFRACTIVE SURGERY  ` 2004   "for glaucoma"  . TONSILLECTOMY  1968  . TRIGGER FINGER RELEASE Left 12/04/2015   Procedure: LEFT RING FINGER TRIGGER RELEASE;  Surgeon: KeLeanora CoverMD;  [facility];  Laterality: Left;  . Marland KitchenAGINAL HYSTERECTOMY  1985   Fibroids    Family History  Problem Relation Age of Onset  . Colon cancer Mother   . COPD Mother   . Emphysema Mother   . Cancer Maternal Grandmother   . Cancer Maternal Grandfather   . Heart disease Brother   . Diabetes Neg Hx     Allergies  Allergen Reactions  . Atorvastatin     Muscle pain  . Simvastatin Other (See Comments)    Muscle pain  . Sulfa Antibiotics     unknown  . Iodinated Diagnostic Agents Rash     Red rash after cardiac cath 1 wk ago, ? Contrast allergy, requires 13 hr prep now per [email or web address]  Red rash after cardiac cath 1 wk ago, ? Contrast allergy, requires 13 hr prep now per [email or web address]    Current Outpatient Medications on File Prior to Visit  Medication Sig Dispense Refill  . ascorbic acid (VITAMIN C) 500 MG tablet Take 500 mg by mouth as needed.     . Marland Kitchenspirin EC 81 MG tablet Take 1 tablet (81 mg total) by mouth daily.    . busPIRone (BUSPAR) 7.5 MG tablet Take 1 tablet (7.5 mg total) by mouth 2 (two) times daily as needed. 60 tablet 0  . calcium-vitamin D (CALCIUM 500+D) 500-200 MG-UNIT per tablet Take 1 tablet by  mouth 2 (two) times daily.     . ferrous sulfate 325 (65 FE) MG tablet  Take 1 tablet (325 mg total) by mouth daily. 30 tablet 0  . folic acid (FOLVITE) 1 MG tablet Take 1 mg by mouth daily.    Marland Kitchen latanoprost (XALATAN) 0.005 % ophthalmic solution PLACE 1 DROP INTO BOTH EYES AT BEDTIME. 2.5 mL 2  . metFORMIN (GLUCOPHAGE) 1000 MG tablet Take 1,000 mg by mouth daily with breakfast.     . metoprolol succinate (TOPROL-XL) 50 MG 24 hr tablet Take 1 tablet (50 mg total) by mouth daily. NEEDS APPT 90 tablet 3  . pantoprazole (PROTONIX) 40 MG tablet TAKE ONE TABLET BY MOUTH DAILY 72 tablet 9  . potassium chloride SA (K-DUR) 20 MEQ tablet Take 20 mEq by mouth 3 (three) times daily.    . rosuvastatin (CRESTOR) 20 MG tablet Take 1 tablet (20 mg total) by mouth daily. 90 tablet 3  . torsemide (DEMADEX) 20 MG tablet Take 2 tablets (40 mg total) by mouth daily. 60 tablet 6  . traZODone (DESYREL) 50 MG tablet Take 0.5-1 tablets (25-50 mg total) by mouth at bedtime as needed for sleep. 30 tablet 0  . warfarin (COUMADIN) 3 MG tablet TAKE 1/2 TO 1 TABLET BY MOUTH DAILY AS DIRECTED 40 tablet 1  . alendronate (FOSAMAX) 10 MG tablet Take 1 tablet (10 mg total) by mouth daily before breakfast. Take with a full glass of water on an empty stomach. (Patient not taking: Reported on 09/27/2019) 30 tablet 11   No current facility-administered medications on file prior to visit.    BP (!) 121/44   Pulse 60   Ht _0  (1.575 m)   Wt 173 lb (78.5 kg)   SpO2 98%   BMI 31.64 kg/m       Objective:   Physical Exam  General Appearance- Not in acute distress.    Chest and Lung Exam Auscultation: Breath sounds:-Normal. Clear even and unlabored. Adventitious sounds:- No Adventitious sounds.  Cardiovascular Auscultation:Rythm - Regular, rate and rythm. Heart Sounds -Normal heart sounds.  Abdomen Inspection:-Inspection Normal.  Palpation/Perucssion: Palpation and Percussion of the abdomen reveal- Non Tender, No Rebound tenderness, No rigidity(Guarding) and No Palpable abdominal masses.    Liver:-Normal.  Spleen:- Normal.   Back Mid lumbar spine tenderness to palpation. Pain on straight leg lift. Pain on lateral movements and flexion/extension of the spine.  Lower ext neurologic  L5-S1 sensation intact bilaterally. Normal patellar reflexes bilaterally. No foot drop bilaterally.       Assessment & Plan:  For history of chronic pain with unsuccessful visit/treatment with pain specialist, I decided to go ahead and prescribe you low-dose Norco a limited number per month.  Try to get by with just 1 tablet a day as needed for severe pain.  UDS given today and contract signed.  History of CHF.  Will get BMP today.  Also make sure you keep your follow-up appointment with cardiologist before the end of the year.  History of diabetes.  Continue follow-up with endocrinologist.  History of insomnia.  Continue trazodone.  You have saw some history of depression and this might help your mood as well.  Hip pain might be associated with lower back pain based on MRI findings.  Prior x-ray of hips were normal.  History of osteoporosis, continue Fosamax.  Hyperlipidemia history.  We will get metabolic panel and lipid panel today.  History of decreased GFR.  Follow metabolic panel results.  Recent diarrhea 4-5  loose stools a day for the last month.  Also noted history of IBS-diarrhea.  Will get stool panel studies and rule out infectious cause.  Go ahead and prescribe Bentyl.  Follow-up date to be determined after lab review.  Counseled patient that for controlled medications follow-up visit would be every 4 months.  Time spent with patient today was 40+  minutes which consisted of chart revdiew, discussing diagnoses, work up, treatment, educating on controlled med rules/regulations/side effects of narcotics and documentation.

## 2020-03-25 NOTE — Patient Instructions (Signed)
For history of chronic pain with unsuccessful visit/treatment with pain specialist, I decided to go ahead and prescribe you low-dose Norco a limited number per month.  Try to get by with just 1 tablet a day as needed for severe pain.  UDS given today and contract signed.  History of CHF.  Will get BMP today.  Also make sure you keep your follow-up appointment with cardiologist before the end of the year.  History of diabetes.  Continue follow-up with endocrinologist.  History of insomnia.  Continue trazodone.  You have saw some history of depression and this might help your mood as well.  Hip pain might be associated with lower back pain based on MRI findings.  Prior x-ray of hips were normal.  History of osteoporosis, continue Fosamax.  Hyperlipidemia history.  We will get metabolic panel and lipid panel today.  History of decreased GFR.  Follow metabolic panel results.  Recent diarrhea 4-5 loose stools a day for the last month.  Also noted history of IBS-diarrhea.  Will get stool panel studies and rule out infectious cause.  Go ahead and prescribe Bentyl.  Follow-up date to be determined after lab review.  Counseled patient that for controlled medications follow-up visit would be every 4 months.

## 2020-03-26 ENCOUNTER — Other Ambulatory Visit: Payer: Self-pay | Admitting: Medical

## 2020-03-26 LAB — COMPLETE METABOLIC PANEL WITH GFR
AG Ratio: 1.9 (calc) (ref 1.0–2.5)
ALT: 12 U/L (ref 6–29)
AST: 20 U/L (ref 10–35)
Albumin: 4.7 g/dL (ref 3.6–5.1)
Alkaline phosphatase (APISO): 66 U/L (ref 37–153)
BUN/Creatinine Ratio: 19 (calc) (ref 6–22)
BUN: 29 mg/dL — ABNORMAL HIGH (ref 7–25)
CO2: 28 mmol/L (ref 20–32)
Calcium: 9.9 mg/dL (ref 8.6–10.4)
Chloride: 97 mmol/L — ABNORMAL LOW (ref 98–110)
Creat: 1.56 mg/dL — ABNORMAL HIGH (ref 0.60–0.93)
GFR, Est African American: 38 mL/min/{1.73_m2} — ABNORMAL LOW (ref >=60)
GFR, Est Non African American: 32 mL/min/{1.73_m2} — ABNORMAL LOW (ref >=60)
Globulin: 2.5 g/dL (calc) (ref 1.9–3.7)
Glucose, Bld: 178 mg/dL — ABNORMAL HIGH (ref 65–99)
Potassium: 3.4 mmol/L — ABNORMAL LOW (ref 3.5–5.3)
Sodium: 140 mmol/L (ref 135–146)
Total Bilirubin: 1.1 mg/dL (ref 0.2–1.2)
Total Protein: 7.2 g/dL (ref 6.1–8.1)

## 2020-03-26 LAB — HEMOGLOBIN A1C
Hgb A1c MFr Bld: 5.9 % of total Hgb — ABNORMAL HIGH (ref <5.7)
Mean Plasma Glucose: 123 (calc)
eAG (mmol/L): 6.8 (calc)

## 2020-03-26 LAB — DRUG MONITORING, PANEL 8 WITH CONFIRMATION, URINE
6 Acetylmorphine: NEGATIVE ng/mL (ref <10)
Alcohol Metabolites: NEGATIVE ng/mL
Amphetamines: NEGATIVE ng/mL (ref <500)
Benzodiazepines: NEGATIVE ng/mL (ref <100)
Buprenorphine, Urine: NEGATIVE ng/mL (ref <5)
Cocaine Metabolite: NEGATIVE ng/mL (ref <150)
Creatinine: 107.6 mg/dL
MDMA: NEGATIVE ng/mL (ref <500)
Marijuana Metabolite: NEGATIVE ng/mL (ref <20)
Opiates: NEGATIVE ng/mL (ref <100)
Oxidant: NEGATIVE ug/mL
Oxycodone: NEGATIVE ng/mL (ref <100)
pH: 5.4 (ref 4.5–9.0)

## 2020-03-26 LAB — LIPID PANEL
Cholesterol: 139 mg/dL (ref <200)
HDL: 48 mg/dL — ABNORMAL LOW (ref >=50)
LDL Cholesterol (Calc): 62 mg/dL (calc)
Non-HDL Cholesterol (Calc): 91 mg/dL (calc) (ref <130)
Total CHOL/HDL Ratio: 2.9 (calc) (ref <5.0)
Triglycerides: 229 mg/dL — ABNORMAL HIGH (ref <150)

## 2020-03-26 LAB — DM TEMPLATE

## 2020-03-26 LAB — BRAIN NATRIURETIC PEPTIDE: Brain Natriuretic Peptide: 42 pg/mL (ref <100)

## 2020-03-27 ENCOUNTER — Other Ambulatory Visit: Payer: Medicare Other

## 2020-03-27 ENCOUNTER — Other Ambulatory Visit: Payer: Self-pay

## 2020-03-27 DIAGNOSIS — N1832 Chronic kidney disease, stage 3b: Secondary | ICD-10-CM | POA: Diagnosis not present

## 2020-03-27 DIAGNOSIS — R197 Diarrhea, unspecified: Secondary | ICD-10-CM | POA: Diagnosis not present

## 2020-03-27 DIAGNOSIS — E1140 Type 2 diabetes mellitus with diabetic neuropathy, unspecified: Secondary | ICD-10-CM | POA: Diagnosis not present

## 2020-03-27 DIAGNOSIS — Z7984 Long term (current) use of oral hypoglycemic drugs: Secondary | ICD-10-CM | POA: Diagnosis not present

## 2020-03-27 DIAGNOSIS — Z23 Encounter for immunization: Secondary | ICD-10-CM | POA: Diagnosis not present

## 2020-03-28 LAB — CLOSTRIDIUM DIFFICILE TOXIN B, QUALITATIVE, REAL-TIME PCR: Toxigenic C. Difficile by PCR: NOT DETECTED

## 2020-04-03 LAB — SALMONELLA/SHIGELLA CULT, CAMPY EIA AND SHIGA TOXIN RFL ECOLI
MICRO NUMBER: 11160701
MICRO NUMBER:: 11160702
MICRO NUMBER:: 11160704
Result:: NOT DETECTED
SHIGA RESULT:: NOT DETECTED
SPECIMEN QUALITY: ADEQUATE
SPECIMEN QUALITY:: ADEQUATE
SPECIMEN QUALITY:: ADEQUATE

## 2020-04-03 LAB — OVA AND PARASITE EXAMINATION
CONCENTRATE RESULT:: NONE SEEN
MICRO NUMBER:: 11160703
SPECIMEN QUALITY:: ADEQUATE
TRICHROME RESULT:: NONE SEEN

## 2020-04-03 MED FILL — PFIZER-BIONTECH COVID-19 VA: 30 | 1 days supply | Qty: 0 | Fill #0

## 2020-04-07 ENCOUNTER — Ambulatory Visit (INDEPENDENT_AMBULATORY_CARE_PROVIDER_SITE_OTHER): Payer: Medicare Other

## 2020-04-07 DIAGNOSIS — Z7901 Long term (current) use of anticoagulants: Secondary | ICD-10-CM

## 2020-04-07 DIAGNOSIS — Z952 Presence of prosthetic heart valve: Secondary | ICD-10-CM | POA: Diagnosis not present

## 2020-04-07 LAB — POCT INR: INR: 3.7 — AB (ref 2.0–3.0)

## 2020-04-07 NOTE — Patient Instructions (Signed)
Continue with 1 tablet daily except 1/2 tablet each Tuesday, Thursday and Saturday.  Repeat INR in 6 weeks.  Eat Spinach over the next 2 days

## 2020-04-16 ENCOUNTER — Other Ambulatory Visit: Payer: Self-pay

## 2020-04-16 ENCOUNTER — Emergency Department (HOSPITAL_COMMUNITY)
Admission: EM | Admit: 2020-04-16 | Discharge: 2020-04-17 | Disposition: A | Discharge status: Home / Self Care | Payer: Medicare Other | Attending: Emergency Medicine | Admitting: Emergency Medicine

## 2020-04-16 ENCOUNTER — Emergency Department (HOSPITAL_COMMUNITY): Payer: Medicare Other

## 2020-04-16 DIAGNOSIS — S42202A Unspecified fracture of upper end of left humerus, initial encounter for closed fracture: Secondary | ICD-10-CM | POA: Insufficient documentation

## 2020-04-16 DIAGNOSIS — S0990XA Unspecified injury of head, initial encounter: Secondary | ICD-10-CM | POA: Insufficient documentation

## 2020-04-16 DIAGNOSIS — S80212A Abrasion, left knee, initial encounter: Secondary | ICD-10-CM | POA: Diagnosis not present

## 2020-04-16 DIAGNOSIS — Z7982 Long term (current) use of aspirin: Secondary | ICD-10-CM | POA: Diagnosis not present

## 2020-04-16 DIAGNOSIS — Z79899 Other long term (current) drug therapy: Secondary | ICD-10-CM | POA: Insufficient documentation

## 2020-04-16 DIAGNOSIS — Z951 Presence of aortocoronary bypass graft: Secondary | ICD-10-CM | POA: Insufficient documentation

## 2020-04-16 DIAGNOSIS — I110 Hypertensive heart disease with heart failure: Secondary | ICD-10-CM | POA: Insufficient documentation

## 2020-04-16 DIAGNOSIS — Z7901 Long term (current) use of anticoagulants: Secondary | ICD-10-CM | POA: Insufficient documentation

## 2020-04-16 DIAGNOSIS — I5032 Chronic diastolic (congestive) heart failure: Secondary | ICD-10-CM | POA: Insufficient documentation

## 2020-04-16 DIAGNOSIS — Z7984 Long term (current) use of oral hypoglycemic drugs: Secondary | ICD-10-CM | POA: Insufficient documentation

## 2020-04-16 DIAGNOSIS — S4992XA Unspecified injury of left shoulder and upper arm, initial encounter: Secondary | ICD-10-CM | POA: Diagnosis present

## 2020-04-16 DIAGNOSIS — E1143 Type 2 diabetes mellitus with diabetic autonomic (poly)neuropathy: Secondary | ICD-10-CM | POA: Diagnosis not present

## 2020-04-16 DIAGNOSIS — M25512 Pain in left shoulder: Secondary | ICD-10-CM | POA: Diagnosis not present

## 2020-04-16 DIAGNOSIS — W01198A Fall on same level from slipping, tripping and stumbling with subsequent striking against other object, initial encounter: Secondary | ICD-10-CM | POA: Insufficient documentation

## 2020-04-16 DIAGNOSIS — R9082 White matter disease, unspecified: Secondary | ICD-10-CM | POA: Diagnosis not present

## 2020-04-16 DIAGNOSIS — I672 Cerebral atherosclerosis: Secondary | ICD-10-CM | POA: Diagnosis not present

## 2020-04-16 DIAGNOSIS — W19XXXA Unspecified fall, initial encounter: Secondary | ICD-10-CM

## 2020-04-16 DIAGNOSIS — I2510 Atherosclerotic heart disease of native coronary artery without angina pectoris: Secondary | ICD-10-CM | POA: Diagnosis not present

## 2020-04-16 DIAGNOSIS — S42292A Other displaced fracture of upper end of left humerus, initial encounter for closed fracture: Secondary | ICD-10-CM | POA: Diagnosis not present

## 2020-04-16 DIAGNOSIS — I6782 Cerebral ischemia: Secondary | ICD-10-CM | POA: Diagnosis not present

## 2020-04-16 LAB — CBC
HCT: 37.3 % (ref 36.0–46.0)
Hemoglobin: 11.7 g/dL — ABNORMAL LOW (ref 12.0–15.0)
MCH: 27.9 pg (ref 26.0–34.0)
MCHC: 31.4 g/dL (ref 30.0–36.0)
MCV: 89 fL (ref 80.0–100.0)
Platelets: 167 10*3/uL (ref 150–400)
RBC: 4.19 MIL/uL (ref 3.87–5.11)
RDW: 15.6 % — ABNORMAL HIGH (ref 11.5–15.5)
WBC: 9.4 10*3/uL (ref 4.0–10.5)
nRBC: 0 % (ref 0.0–0.2)

## 2020-04-16 LAB — BASIC METABOLIC PANEL
Anion gap: 14 (ref 5–15)
BUN: 23 mg/dL (ref 8–23)
CO2: 24 mmol/L (ref 22–32)
Calcium: 9.9 mg/dL (ref 8.9–10.3)
Chloride: 99 mmol/L (ref 98–111)
Creatinine, Ser: 1.49 mg/dL — ABNORMAL HIGH (ref 0.44–1.00)
GFR, Estimated: 37 mL/min — ABNORMAL LOW (ref >60)
Glucose, Bld: 188 mg/dL — ABNORMAL HIGH (ref 70–99)
Potassium: 3.8 mmol/L (ref 3.5–5.1)
Sodium: 137 mmol/L (ref 135–145)

## 2020-04-16 LAB — PROTIME-INR
INR: 2.3 — ABNORMAL HIGH (ref 0.8–1.2)
Prothrombin Time: 24.8 seconds — ABNORMAL HIGH (ref 11.4–15.2)

## 2020-04-16 MED ORDER — FENTANYL CITRATE (PF) 100 MCG/2ML IJ SOLN
50.0000 ug | Freq: Once | INTRAMUSCULAR | Status: AC
  Administered 2020-04-16: 50 ug via INTRAVENOUS
  Filled 2020-04-16: qty 2

## 2020-04-16 MED ORDER — HYDROMORPHONE HCL 2 MG PO TABS
2.0000 mg | ORAL_TABLET | Freq: Four times a day (QID) | ORAL | 0 refills | Status: DC | PRN
Start: 2020-04-16 — End: 2020-04-21

## 2020-04-16 MED ORDER — HYDROMORPHONE HCL 1 MG/ML IJ SOLN
1.0000 mg | Freq: Once | INTRAMUSCULAR | Status: AC
  Administered 2020-04-16: 1 mg via INTRAVENOUS
  Filled 2020-04-16: qty 1

## 2020-04-16 NOTE — ED Triage Notes (Signed)
Pt reports falling in her driveway at home pta.  Pt states that she lost her balance when exiting her car.  Pt reports 10/10 pain to her left shoulder and knee.  Pt denies LOC.  NADN.

## 2020-04-16 NOTE — Discharge Instructions (Addendum)
Follow-up with Dr. Stann Mainland or Dr. Onnie Graham.

## 2020-04-17 NOTE — ED Provider Notes (Signed)
[facility] DEPT Provider Note   CSN: [id number] Arrival date & time: 04/16/20  2013     History Chief Complaint  Patient presents with  . Fall    Abigail Oliver is a 74 y.o. female.  HPI Patient presents after mechanical fall.  States she was getting out of her car.  States she taps her feet because of her neuropathy.  States she still fell forward.  Hitting her head left knee and left shoulder.  Complaining mostly of pain in left shoulder.  However patient is on Coumadin for mechanical aortic valve.  No numbness weakness.  No loss conscious.  Just severe pain in left shoulder.  No chest or abdominal pain.  Has been ambulatory since the event.    Past Medical History:  Diagnosis Date  . Anemia   . Anxiety   . Aortic stenosis    a. Now has St Jude mechanical aortic valve (6/00).b. Echo (6/15) with EF 60-65%, mechanical aortic valve with mean gradient 27 mmHg.  . Arthritis    a. Possible c-spine arthritis with pain down left arm.   . Carotid artery disease ([place])    a. Carotid dopplers (7/16) with 40-59% BICA stenosis.   . Chronic angle-closure glaucoma(365.23)   . Chronic diastolic CHF (congestive heart failure) ([place])    a.  RHC (7/15) with mean RA 2, PA 22/11, mean PCWP 9, CI 3.79.  Marland Kitchen Colon polyps   . Contrast media allergy   . Coronary atherosclerosis of native coronary artery    a. CABG at time of AVR in 6/00 with RIMA-RCA.b. Abnl nuc 11/2013 -> LHC (7/15) with patent RIMA-RCA, 80% mRCA, 80% pLAD with FFR 0.73, treated with DES to pLAD.  Marland Kitchen Depression   . Dizziness 02/09/2012   a. Holter (8/14) with few PACs, otherwise unremarkable.   . Elevated transaminase level   . Essential hypertension   . Gastritis   . GERD (gastroesophageal reflux disease)   . Hyperlipidemia   . Low back pain   . Mechanical heart valve present   . Neuromuscular disorder (HCC)    neuropathy  . Peripheral vascular disease ([place])    a, H/o Right SFA stent. b.  Peripheral arterial dopplers (7/16) with right SFA stent patent.   Marland Kitchen PONV (postoperative nausea and vomiting)    N&V  . Pulmonary nodules    a. Noted by CT in 6/15. CT chest (8/15) was thought to indicate that nodules were benign. No further workup was recommended.   . Syncope 03/29/2012   carotid doppler - R ICA 50-69% reduction by velocities (low end); L ICA 0-49% reduction by velocities (low end); R and L subclavian arteries - <50% redcution; R and L vertebral arteries show normal antegrade flow  . Type II diabetes mellitus [facility])     Patient Active Problem List   Diagnosis Date Noted  . Benign paroxysmal positional vertigo 07/14/2015  . Dizziness and giddiness 07/14/2015  . Diabetic polyneuropathy associated with type 2 diabetes mellitus ([place]) 07/14/2015  . Spinal stenosis, lumbar region, without neurogenic claudication 07/14/2015  . Cervical radiculopathy 07/14/2015  . Chest pain 07/07/2015  . Angina pectoris ([place]) 07/07/2015  . Dizziness 05/14/2015  . Left-sided weakness 02/23/2015  . Acute sinusitis 01/29/2015  . Cough 08/29/2014  . Cerumen impaction 03/28/2014  . S/P arterial stent-to LAD, Promus DES 12/14/13 12/15/2013  . Pulmonary nodules/lesions, multiple 11/19/2013  . Rotator cuff syndrome of left shoulder 05/28/2013  . Chronic diastolic heart failure ([place]) 01/02/2013  .  Anemia, iron deficiency 11/05/2012  . Dyslipidemia 09/21/2011  . CAD, CABG 2000, low risk Myoview April 2011; 2015 + myoview 09/21/2011  . Diastolic dysfunction with NL LVF 2D August 2012 09/21/2011  . Claudication ([place]) 09/21/2011  . PVD, Rt SFA PTA/Stent 09/20/11 09/21/2011  . Nodule of neck 07/01/2011  . MIGRAINE, OPHTHALMIC 12/16/2009  . DEPRESSION, MAJOR, RECURRENT, MODERATE 12/15/2009  . OSTEOARTHRITIS, GENERALIZED, MULTIPLE JOINTS 12/30/2008  . Type II diabetes mellitus with peripheral autonomic neuropathy ([place]) 07/01/2008  . Chronic angle-closure glaucoma(365.23) 07/01/2008  . DISPLCMT  LUMBAR INTERVERT [place] W/O MYELOPATHY 07/01/2008  . Calcaneal spur 07/01/2008  . OSTEOPOROSIS 07/01/2008    Past Surgical History:  Procedure Laterality Date  . BILATERAL OOPHORECTOMY  1987  . BREAST BIOPSY  2012   left  . CARDIAC CATHETERIZATION  01/14/1999   normal LV function, severe aortic stenosis; 80% and 70% stenosis in RCA; mild 20% distal norrowing in L main with 20% proximal LAD stenosis, 40% diagonal stenosis and 20% proximal circumflex stenosis  . CARDIAC VALVE REPLACEMENT  2000   aortic valve   . CARDIOVASCULAR STRESS TEST  08/25/2011   R/L MV - EF 72%; no scintigraphic evidence of inducible MI; normal perfusionTID of 1.25 elevated - could indicate small vessle subendocardial ischemia; EKG NSR at 66, non diagnostic for ischemia  . [place]   bilaterally  . CATARACT EXTRACTION W/ INTRAOCULAR LENS  IMPLANT, BILATERAL  ~ 2007  . [place]; 1971  . CHOLECYSTECTOMY  1990  . COLONOSCOPY N/A 10/27/2012   Procedure: COLONOSCOPY;  Surgeon: Beryle Beams, MD;  Location: [facility]COPY;  Service: Endoscopy;  Laterality: N/A;  . CORONARY ARTERY BYPASS GRAFT  2000   RIMA to RCA  . DOPPLER ECHOCARDIOGRAPHY  03/29/2012   EF >55%; mild concentric LVH; stage 1 diastolic dysfunction, elevated LV filling pressure, dilated LA; MAC mild MR; St Jude AVR peak and mean gradients of 76mHg and 271mg; transvalvular gradients have increased (prev 23 and 14 respectively)  . ESOPHAGOGASTRODUODENOSCOPY N/A 10/27/2012   Procedure: ESOPHAGOGASTRODUODENOSCOPY (EGD);  Surgeon: PaBeryle BeamsMD;  [facility];  Service: Endoscopy;  Laterality: N/A;  . FEMORAL ARTERY STENT  09/20/2011   6 x 40 Smart Nitinol self-expanding stent placed;  10/15/2031 -R SFA stent open and patent w/o evidence of restenosis  . FRACTIONAL FLOW RESERVE WIRE  12/14/2013   Procedure: FRACTIONAL FLOW RESERVE WIRE;  Surgeon: DaLarey DresserMD;  [facility];  Service: Cardiovascular;;  .  LACERATION REPAIR     right hand  . LEFT AND RIGHT HEART CATHETERIZATION WITH CORONARY ANGIOGRAM N/A 12/14/2013   Procedure: LEFT AND RIGHT HEART CATHETERIZATION WITH CORONARY ANGIOGRAM;  Surgeon: DaLarey DresserMD;  [facility];  Service: Cardiovascular;  Laterality: N/A;  . LOWER EXTREMITY ANGIOGRAM N/A 09/20/2011   Procedure: LOWER EXTREMITY ANGIOGRAM;  Surgeon: JoLorretta HarpMD;  [facility];  Service: Cardiovascular;  Laterality: N/A;  . LYMPH NODE BIOPSY  06/2011   "core needle on 5"  . needle biopsy  2009   "on ankles for nerve damage"  . NEUROPLASTY / TRANSPOSITION ULNAR NERVE AT ELBOW     right  . PERCUTANEOUS CORONARY STENT INTERVENTION (PCI-S)  12/14/2013   Procedure: PERCUTANEOUS CORONARY STENT INTERVENTION (PCI-S);  Surgeon: DaLarey DresserMD;  [facility];  Service: Cardiovascular;;  Prox LAD 3.00x12 Promus DES   . PERIPHERAL ARTERIAL STENT GRAFT  09/20/11   right SFA  .  REFRACTIVE SURGERY  ` 2004   "for glaucoma"  . TONSILLECTOMY  1968  . TRIGGER FINGER RELEASE Left 12/04/2015   Procedure: LEFT RING FINGER TRIGGER RELEASE;  Surgeon: Leanora Cover, MD;  Location: [facility];  Service: Orthopedics;  Laterality: Left;  Marland Kitchen VAGINAL HYSTERECTOMY  1985   Fibroids     OB History   No obstetric history on file.     Family History  Problem Relation Age of Onset  . Colon cancer Mother   . COPD Mother   . Emphysema Mother   . Cancer Maternal Grandmother   . Cancer Maternal Grandfather   . Heart disease Brother   . Diabetes Neg Hx     Social History   Tobacco Use  . Smoking status: Never Smoker  . Smokeless tobacco: Never Used  Substance Use Topics  . Alcohol use: No  . Drug use: No    Home Medications Prior to Admission medications   Medication Sig Start Date End Date Taking? Authorizing Provider  alendronate (FOSAMAX) 10 MG tablet Take 1 tablet (10 mg total) by mouth daily before breakfast. Take with a full glass  of water on an empty stomach. Patient not taking: Reported on 09/27/2019 07/24/19   Saguier, Percell Miller, PA-C  ascorbic acid (VITAMIN C) 500 MG tablet Take 500 mg by mouth as needed.     [provider]  aspirin EC 81 MG tablet Take 1 tablet (81 mg total) by mouth daily. 02/21/15   Larey Dresser, MD  busPIRone (BUSPAR) 7.5 MG tablet Take 1 tablet (7.5 mg total) by mouth 3 (three) times daily. 03/25/20   Saguier, Percell Miller, PA-C  calcium-vitamin D (CALCIUM 500+D) 500-200 MG-UNIT per tablet Take 1 tablet by mouth 2 (two) times daily.     [provider]  dicyclomine (BENTYL) 10 MG capsule Take 1 capsule (10 mg total) by mouth 3 (three) times daily before meals. 03/25/20   Saguier, Percell Miller, PA-C  ferrous sulfate 325 (65 FE) MG tablet Take 1 tablet (325 mg total) by mouth daily. 10/28/13   Pattricia Boss, MD  folic acid (FOLVITE) 1 MG tablet Take 1 mg by mouth daily.    [provider]  HYDROcodone-acetaminophen [facility]) 5-325 MG tablet 1 tab daily per severe pain 03/25/20   Saguier, Percell Miller, PA-C  HYDROmorphone (DILAUDID) 2 MG tablet Take 1 tablet (2 mg total) by mouth every 6 (six) hours as needed for severe pain. 04/16/20   Davonna Belling, MD  latanoprost (XALATAN) 0.005 % ophthalmic solution PLACE 1 DROP INTO BOTH EYES AT BEDTIME. 07/25/14   Hoyt Koch, MD  metFORMIN (GLUCOPHAGE) 1000 MG tablet Take 1,000 mg by mouth daily with breakfast.  10/31/18   [provider]  metoprolol succinate (TOPROL-XL) 50 MG 24 hr tablet Take 1 tablet (50 mg total) by mouth daily. NEEDS APPT 11/19/19   Larey Dresser, MD  pantoprazole (PROTONIX) 40 MG tablet TAKE ONE TABLET BY MOUTH DAILY 11/05/19   Saguier, Percell Miller, PA-C  potassium chloride SA (K-DUR) 20 MEQ tablet Take 20 mEq by mouth 3 (three) times daily.    [provider]  rosuvastatin (CRESTOR) 20 MG tablet Take 1 tablet (20 mg total) by mouth daily. 12/30/16   Bensimhon, Shaune Pascal, MD  torsemide (DEMADEX) 20 MG tablet Take 2  tablets (40 mg total) by mouth daily. 12/25/19   Bensimhon, Shaune Pascal, MD  traZODone (DESYREL) 50 MG tablet Take 0.5-1 tablets (25-50 mg total) by mouth at bedtime as needed for sleep.  02/21/20   Saguier, Percell Miller, PA-C  warfarin (COUMADIN) 3 MG tablet TAKE 1/2 TO 1 TABLET BY MOUTH DAILY AS DIRECTED 02/21/20   Larey Dresser, MD    Allergies    Atorvastatin, Simvastatin, Sulfa antibiotics, and Iodinated diagnostic agents  Review of Systems   Review of Systems  Constitutional: Negative for appetite change.  HENT: Negative for congestion.   Respiratory: Negative for shortness of breath.   Gastrointestinal: Negative for abdominal pain.  Genitourinary: Negative for flank pain.  Musculoskeletal: Negative for back pain and neck pain.       Left shoulder pain.  Skin: Positive for wound.       Abrasion to left knee.  Neurological: Negative for weakness and numbness.  Psychiatric/Behavioral: Negative for confusion.    Physical Exam Updated Vital Signs BP (!) 152/63 (BP Location: Right Arm)   Pulse 94   Temp 97.9 F (36.6 C) (Oral)   Resp 17   Ht _0  (1.575 m)   Wt 78 kg   SpO2 94%   BMI 31.46 kg/m   Physical Exam Vitals and nursing note reviewed.  HENT:     Head: Normocephalic and atraumatic.     Mouth/Throat:     Mouth: Mucous membranes are moist.  Eyes:     Pupils: Pupils are equal, round, and reactive to light.  Neck:     Comments: No midline tenderness.  Painless range of motion. Cardiovascular:     Rate and Rhythm: Normal rate.  Abdominal:     Tenderness: There is no abdominal tenderness.  Musculoskeletal:        General: Tenderness present.     Cervical back: Neck supple.     Comments: Abrasion to his left knee.  Good range of motion.  However pain over proximal humerus on left.  Decreased range of motion.  Neurovascular intact in left hand.  No elbow tenderness.  No tenderness on right upper or lower extremity.  Skin:    Capillary Refill: Capillary refill takes less  than 2 seconds.  Neurological:     Mental Status: She is alert and oriented to person, place, and time.     ED Results / Procedures / Treatments   Labs (all labs ordered are listed, but only abnormal results are displayed) Labs Reviewed  PROTIME-INR - Abnormal; Notable for the following components:      Result Value   Prothrombin Time 24.8 (*)    INR 2.3 (*)    All other components within normal limits  CBC - Abnormal; Notable for the following components:   Hemoglobin 11.7 (*)    RDW 15.6 (*)    All other components within normal limits  BASIC METABOLIC PANEL - Abnormal; Notable for the following components:   Glucose, Bld 188 (*)    Creatinine, Ser 1.49 (*)    GFR, Estimated 37 (*)    All other components within normal limits    EKG None  Radiology CT Head Wo Contrast  Result Date: 04/16/2020 CLINICAL DATA:  [place] in the driveway.  Head trauma. EXAM: CT HEAD WITHOUT CONTRAST TECHNIQUE: Contiguous axial images were obtained from the base of the skull through the vertex without intravenous contrast. COMPARISON:  MRI 02/24/2015 FINDINGS: Brain: The brain shows mild age related volume loss. There are mild to moderate chronic small-vessel changes of the cerebral hemispheric white matter. No cortical or large vessel territory infarction. No mass lesion, hemorrhage, hydrocephalus or extra-axial collection. Vascular: There is atherosclerotic calcification of the major vessels at  the base of the brain. Skull: Negative Sinuses/Orbits: Clear/normal Other: None IMPRESSION: No acute or traumatic finding. Age related volume loss and chronic small-vessel ischemic changes of the white matter. Electronically Signed   By: Nelson Chimes M.D.   On: 04/16/2020 21:48   DG Shoulder Left  Result Date: 04/16/2020 CLINICAL DATA:  74 year old female with fall and left shoulder pain. EXAM: LEFT SHOULDER - 2+ VIEW COMPARISON:  Left shoulder radiograph dated 04/16/2014. FINDINGS: Mildly displaced fracture of  the left humeral neck. There is probable extension of the fracture to the humeral head. Evaluation of the fracture is however limited due to advanced osteopenia. No dislocation. Slight inferior positioning of the humeral head in relation to the glenoid, may be related to joint effusion. The soft tissues are unremarkable. Median sternotomy wires noted. IMPRESSION: Mildly displaced fracture of the left humeral neck. No dislocation. Electronically Signed   By: Anner Crete M.D.   On: 04/16/2020 21:13    Procedures Procedures (including critical care time)  Medications Ordered in ED Medications  fentaNYL (SUBLIMAZE) injection 50 mcg (50 mcg Intravenous Given 04/16/20 2123)  HYDROmorphone (DILAUDID) injection 1 mg (1 mg Intravenous Given 04/16/20 2240)    ED Course  I have reviewed the triage vital signs and the nursing notes.  Pertinent labs & imaging results that were available during my care of the patient were reviewed by me and considered in my medical decision making (see chart for details).    MDM Rules/Calculators/A&P                          Patient with fall.  On anticoagulation.  Head CT reassuring.  Cervical spine is clinically cleared.  No intracranial hemorrhage.  However does have proximal left humerus fracture.  Discussed with orthopedic surgery, Dr. Alvan Dame.  Recommends follow-up with Dr. Onnie Graham or Dr. Stann Mainland. Final Clinical Impression(s) / ED Diagnoses Final diagnoses:  Fall, initial encounter  Closed fracture of proximal end of left humerus, unspecified fracture morphology, initial encounter    Rx / DC Orders ED Discharge Orders         Ordered    HYDROmorphone (DILAUDID) 2 MG tablet  Every 6 hours PRN        04/16/20 2320           Davonna Belling, MD 04/17/20 [phone]

## 2020-04-18 ENCOUNTER — Other Ambulatory Visit: Payer: Self-pay | Admitting: Medical

## 2020-04-20 ENCOUNTER — Other Ambulatory Visit: Payer: Self-pay | Admitting: Medical

## 2020-04-21 ENCOUNTER — Telehealth: Payer: Self-pay | Admitting: Medical

## 2020-04-21 ENCOUNTER — Other Ambulatory Visit: Payer: Self-pay

## 2020-04-21 ENCOUNTER — Other Ambulatory Visit: Payer: Self-pay | Admitting: Internal Medicine

## 2020-04-21 ENCOUNTER — Ambulatory Visit (INDEPENDENT_AMBULATORY_CARE_PROVIDER_SITE_OTHER): Payer: Medicare Other | Admitting: Internal Medicine

## 2020-04-21 ENCOUNTER — Telehealth: Payer: Self-pay

## 2020-04-21 ENCOUNTER — Encounter: Payer: Self-pay | Admitting: Internal Medicine

## 2020-04-21 VITALS — BP 106/67 | HR 76 | Temp 97.7°F | Ht 62.0 in | Wt 169.4 lb

## 2020-04-21 DIAGNOSIS — F40240 Claustrophobia: Secondary | ICD-10-CM | POA: Diagnosis not present

## 2020-04-21 DIAGNOSIS — F419 Anxiety disorder, unspecified: Secondary | ICD-10-CM | POA: Diagnosis not present

## 2020-04-21 MED ORDER — HYDROCODONE-ACETAMINOPHEN 7.5-325 MG PO TABS
1.0000 | ORAL_TABLET | Freq: Four times a day (QID) | ORAL | 0 refills | Status: DC | PRN
Start: 2020-04-21 — End: 2020-04-21

## 2020-04-21 MED ORDER — CLONAZEPAM 0.5 MG PO TABS
0.2500 mg | ORAL_TABLET | Freq: Three times a day (TID) | ORAL | 0 refills | Status: DC | PRN
Start: 2020-04-21 — End: 2020-04-21

## 2020-04-21 MED ORDER — HYDROCODONE-ACETAMINOPHEN 7.5-325 MG PO TABS: 1.0000 | ORAL_TABLET | Freq: Four times a day (QID) | ORAL | 0 refills | Status: DC | PRN

## 2020-04-21 MED FILL — clonazePAM 0.5 MG TABS: 0.5 | 10 days supply | Qty: 30 | Fill #0

## 2020-04-21 MED FILL — HYDROCODON-APAP 7.5-325: 7.5-325 | 7 days supply | Qty: 28 | Fill #0

## 2020-04-21 NOTE — Telephone Encounter (Signed)
[facility] Night - Client TELEPHONE ADVICE RECORD AccessNurse Patient Name: CLAUDINE STALLINGS Gender: Female DOB: 12-Mar-1946 Age: 74 Y 24 M 14 D Return Phone Number: [phone] (Primary) Address: City/State/Zip: [place] [address] Client [facility] Night - Client Client Site [facility] - Night Physician Saguier, Percell Miller - [place] Contact Type Call Who Is Calling Patient / Member / Family / Caregiver Call Type Triage / Clinical Relationship To Patient Self Return Phone Number [phone] (Primary) Chief Complaint Anxiety and Panic Attack Reason for Call Symptomatic / Request for [place] reports that she broke her arm this week. She states she was prescribed Hydromorphone, but it doesn't seem to be working. She also has increased anxiety with the arm pain and tearful. She has other medications she takes and also has some questions about mixing them. Translation No Nurse Assessment Nurse: Wynetta Emery, RN, Santiago Glad Date/Time Eilene Ghazi Time): 04/19/2020 1:41:54 PM Confirm and document reason for call. If symptomatic, describe symptoms. ---Caller reports that she broke her left arm this week. She states she was prescribed Hydro-morphone, but it doesn't seem to be working. She also has increased anxiety with the arm pain and tearful. She has other medications she takes and also has some questions about mixing them. She has been taking her other pain medication hydro-codone- every 6 hours instead of daily. Because it works better than the hydro-morphone. Does the patient have any new or worsening symptoms? ---Yes Will a triage be completed? ---Yes Related visit to physician within the last 2 weeks? ---Yes Does the PT have any chronic conditions? (i.e. diabetes, asthma, this includes High risk factors for pregnancy, etc.) ---Yes List chronic conditions. ---nerve pain, anxiety, depression, Is this a  behavioral health or substance abuse call? ---No Nurse: Wynetta Emery, RN, Santiago Glad Date/Time Eilene Ghazi Time): 04/19/2020 1:55:05 PM Please select the assessment type ---Request for controlled medication refill Additional Documentation ---Caller states that due to breaking her arm, she has not been taking hydro codone as prescribed and will be out of medication. Caller states that she wants provider to be aware of why she was not taking medication as prescribed and why she needs a refill ahead of schedule. PLEASE NOTE: All timestamps contained within this report are represented as [place] Standard Time. CONFIDENTIALTY NOTICE: This fax transmission is intended only for the addressee. It contains information that is legally privileged, confidential or otherwise protected from use or disclosure. If you are not the intended recipient, you are strictly prohibited from reviewing, disclosing, copying using or disseminating any of this information or taking any action in reliance on or regarding this information. If you have received this fax in error, please notify us immediately by telephone so that we can arrange for its return to [place]. Phone: [phone], Toll-Free: [phone], Fax: [phone] Page: 2 of 2 Call Id: [id number] Nurse Assessment Is there an on-call physician for the client? ---Yes Do the client directives specifically allow for paging the on-call regarding scheduled drugs? ---No Guidelines Guideline Title Affirmed Question Affirmed Notes Nurse Date/Time Eilene Ghazi Time) Recent Medical Visit for Injury Follow-up Call [1] SEVERE pain (e.g., excruciating, pain scale 8-10) AND [2] not improved after pain medications Wynetta Emery, RN, Santiago Glad 04/19/2020 1:47:22 PM Disp. Time Eilene Ghazi Time) Disposition Final User 04/19/2020 1:50:57 PM Go to ED Now (or PCP triage) Yes Wynetta Emery, RN, Santiago Glad Disposition Overriden: Call PCP Now Override Reason: Specify reason. (Please document in 'advice recommended'  section) Caller Disagree/Comply Disagree Caller Understands Yes PreDisposition Did  not know what to do Care Advice Given Per Guideline GO TO ED NOW (OR PCP TRIAGE): * IF NO PCP (PRIMARY CARE PROVIDER) SECOND-LEVEL TRIAGE: You need to be seen within the next hour. Go to the [facility] at _____________ [place] as soon as you can. Patient referred to ED/UC for pain control as no medictions called in after hours. Comments User: Nicholaus Corolla, RN Date/Time Eilene Ghazi Time): 04/19/2020 1:54:44 PM Caller states that she has not been taking her other regular medications as ordered because she did not want them to interact. Medication they asked about was buspirone- RN informed they can be taken together but that the sedative effects could be intensified. She also mentioned taking Trazodone. Caller refused to go to the ED at this time. Referrals GO TO FACILITY REFUSED

## 2020-04-21 NOTE — Telephone Encounter (Signed)
Can you get her scheduled for tomorrow. She can take 2 tab of norco every 6 hours as needed for pain. Not to take the hydromorphone.

## 2020-04-21 NOTE — Telephone Encounter (Signed)
Patient evaluated and treated by Dr. Larose Kells today (04/21/2020).

## 2020-04-21 NOTE — Telephone Encounter (Signed)
Opened to review 

## 2020-04-21 NOTE — Patient Instructions (Addendum)
Stop taking hydromorphone  Okay to take hydrocodone as prescribed by Percell Miller, but I am going to increase the dose to 7.5 mg temporarily.  Take 1 tablet  every 6 hours as needed.  I will send a refill  Increase BuSpar (buspirone) to 1 tablet 3 times a day  You can take clonazepam 0.5 mg: Either half or 1 tablet every 8 hours as needed.  Please be very careful, it may cause drowsiness and increase your risk of falls.  See the orthopedic doctor as recommended

## 2020-04-21 NOTE — Progress Notes (Signed)
Subjective:    Patient ID: Abigail Oliver, female    DOB: 03-14-1946, 74 y.o.   MRN: [id number]  DOS:  04/21/2020 Type of visit - description: Acute, here with her son.  Went to the ER 04/16/2020: She was diagnosed with left shoulder fracture. X-rays: Mildly displaced fracture left humeral head. CT head no acute. Creatinine 1.4, at baseline  Her main concern today is claustrophobia and anxiety. She is here with her son. Reports that she is a very active person and she simply cannot tolerate the sling. She has increased her pain medications and BuSpar on her own.  She was noted to be extremely anxious and tearful, denies any suicidal ideas.   Review of Systems See above   Past Medical History:  Diagnosis Date  . Anemia   . Anxiety   . Aortic stenosis    a. Now has St Jude mechanical aortic valve (6/00).b. Echo (6/15) with EF 60-65%, mechanical aortic valve with mean gradient 27 mmHg.  . Arthritis    a. Possible c-spine arthritis with pain down left arm.   . Carotid artery disease ([place])    a. Carotid dopplers (7/16) with 40-59% BICA stenosis.   . Chronic angle-closure glaucoma(365.23)   . Chronic diastolic CHF (congestive heart failure) ([place])    a.  RHC (7/15) with mean RA 2, PA 22/11, mean PCWP 9, CI 3.79.  Marland Kitchen Colon polyps   . Contrast media allergy   . Coronary atherosclerosis of native coronary artery    a. CABG at time of AVR in 6/00 with RIMA-RCA.b. Abnl nuc 11/2013 -> LHC (7/15) with patent RIMA-RCA, 80% mRCA, 80% pLAD with FFR 0.73, treated with DES to pLAD.  Marland Kitchen Depression   . Dizziness 02/09/2012   a. Holter (8/14) with few PACs, otherwise unremarkable.   . Elevated transaminase level   . Essential hypertension   . Gastritis   . GERD (gastroesophageal reflux disease)   . Hyperlipidemia   . Low back pain   . Mechanical heart valve present   . Neuromuscular disorder (H[facility])    neuropathy  . Peripheral vascular disease ([place])    a, H/o Right SFA stent. b.  Peripheral arterial dopplers (7/16) with right SFA stent patent.   Marland Kitchen PONV (postoperative nausea and vomiting)    N&V  . Pulmonary nodules    a. Noted by CT in 6/15. CT chest (8/15) was thought to indicate that nodules were benign. No further workup was recommended.   . Syncope 03/29/2012   carotid doppler - R ICA 50-69% reduction by velocities (low end); L ICA 0-49% reduction by velocities (low end); R and L subclavian arteries - <50% redcution; R and L vertebral arteries show normal antegrade flow  . Type II diabetes mellitus ([place])     Past Surgical History:  Procedure Laterality Date  . BILATERAL OOPHORECTOMY  1987  . BREAST BIOPSY  2012   left  . CARDIAC CATHETERIZATION  01/14/1999   normal LV function, severe aortic stenosis; 80% and 70% stenosis in RCA; mild 20% distal norrowing in L main with 20% proximal LAD stenosis, 40% diagonal stenosis and 20% proximal circumflex stenosis  . CARDIAC VALVE REPLACEMENT  2000   aortic valve   . CARDIOVASCULAR STRESS TEST  08/25/2011   R/L MV - EF 72%; no scintigraphic evidence of inducible MI; normal perfusionTID of 1.25 elevated - could indicate small vessle subendocardial ischemia; EKG NSR at 66, non diagnostic for ischemia  . [place]  bilaterally  . CATARACT EXTRACTION W/ INTRAOCULAR LENS  IMPLANT, BILATERAL  ~ 2007  . [place]; 1971  . CHOLECYSTECTOMY  1990  . COLONOSCOPY N/A 10/27/2012   Procedure: COLONOSCOPY;  Surgeon: Beryle Beams, MD;  Location: [facility];  Service: Endoscopy;  Laterality: N/A;  . CORONARY ARTERY BYPASS GRAFT  2000   RIMA to RCA  . DOPPLER ECHOCARDIOGRAPHY  03/29/2012   EF >55%; mild concentric LVH; stage 1 diastolic dysfunction, elevated LV filling pressure, dilated LA; MAC mild MR; St Jude AVR peak and mean gradients of 59mHg and 228mg; transvalvular gradients have increased (prev 23 and 14 respectively)  . ESOPHAGOGASTRODUODENOSCOPY N/A 10/27/2012   Procedure:  ESOPHAGOGASTRODUODENOSCOPY (EGD);  Surgeon: PaBeryle BeamsMD;  [facility];  Service: Endoscopy;  Laterality: N/A;  . FEMORAL ARTERY STENT  09/20/2011   6 x 40 Smart Nitinol self-expanding stent placed;  10/15/2031 -R SFA stent open and patent w/o evidence of restenosis  . FRACTIONAL FLOW RESERVE WIRE  12/14/2013   Procedure: FRACTIONAL FLOW RESERVE WIRE;  Surgeon: DaLarey DresserMD;  [facility];  Service: Cardiovascular;;  . LACERATION REPAIR     right hand  . LEFT AND RIGHT HEART CATHETERIZATION WITH CORONARY ANGIOGRAM N/A 12/14/2013   Procedure: LEFT AND RIGHT HEART CATHETERIZATION WITH CORONARY ANGIOGRAM;  Surgeon: DaLarey DresserMD;  [facility];  Service: Cardiovascular;  Laterality: N/A;  . LOWER EXTREMITY ANGIOGRAM N/A 09/20/2011   Procedure: LOWER EXTREMITY ANGIOGRAM;  Surgeon: JoLorretta HarpMD;  [facility];  Service: Cardiovascular;  Laterality: N/A;  . LYMPH NODE BIOPSY  06/2011   "core needle on 5"  . needle biopsy  2009   "on ankles for nerve damage"  . NEUROPLASTY / TRANSPOSITION ULNAR NERVE AT ELBOW     right  . PERCUTANEOUS CORONARY STENT INTERVENTION (PCI-S)  12/14/2013   Procedure: PERCUTANEOUS CORONARY STENT INTERVENTION (PCI-S);  Surgeon: DaLarey DresserMD;  [facility];  Service: Cardiovascular;;  Prox LAD 3.00x12 Promus DES   . PERIPHERAL ARTERIAL STENT GRAFT  09/20/11   right SFA  . REFRACTIVE SURGERY  ` 2004   "for glaucoma"  . TONSILLECTOMY  1968  . TRIGGER FINGER RELEASE Left 12/04/2015   Procedure: LEFT RING FINGER TRIGGER RELEASE;  Surgeon: KeLeanora CoverMD;  [facility];  Laterality: Left;  . Marland KitchenAGINAL HYSTERECTOMY  1985   Fibroids    Allergies as of 04/21/2020      Reactions   Atorvastatin    Muscle pain   Simvastatin Other (See Comments)   Muscle pain   Sulfa Antibiotics    unknown   Iodinated Diagnostic Agents Rash    Red rash after cardiac cath 1  wk ago, ? Contrast allergy, requires 13 hr prep now per [email or web address]  Red rash after cardiac cath 1 wk ago, ? Contrast allergy, requires 13 hr prep now per [email or web address]      Medication List       Accurate as of April 21, 2020 11:59 PM. If you have any questions, ask your nurse or doctor.        STOP taking these medications   HYDROcodone-acetaminophen 5-325 MG tablet Commonly known as: Norco Replaced by: HYDROcodone-acetaminophen 7.5-325 MG tablet Stopped by: JoKathlene NovemberMD   HYDROmorphone 2 MG tablet Commonly known as: Dilaudid Stopped by: JoKathlene NovemberMD     TAKE these medications   alendronate 10 MG tablet Commonly  known as: FOSAMAX Take 1 tablet (10 mg total) by mouth daily before breakfast. Take with a full glass of water on an empty stomach.   ascorbic acid 500 MG tablet Commonly known as: VITAMIN C Take 500 mg by mouth as needed.   aspirin EC 81 MG tablet Take 1 tablet (81 mg total) by mouth daily.   busPIRone 7.5 MG tablet Commonly known as: BUSPAR Take 1 tablet (7.5 mg total) by mouth 3 (three) times daily. What changed:   when to take this  reasons to take this Changed by: Kathlene November, MD   Calcium 500+D 500-200 MG-UNIT tablet Generic drug: calcium-vitamin D Take 1 tablet by mouth 2 (two) times daily.   clonazePAM 0.5 MG tablet Commonly known as: KlonoPIN Take 0.5-1 tablets (0.25-0.5 mg total) by mouth 3 (three) times daily as needed for anxiety. Started by: Kathlene November, MD   dicyclomine 10 MG capsule Commonly known as: BENTYL TAKE ONE CAPSULE BY MOUTH THREE TIMES A DAY BEFORE MEALS   Farxiga 10 MG Tabs tablet Generic drug: dapagliflozin propanediol Take 10 mg by mouth daily.   ferrous sulfate 325 (65 FE) MG tablet Take 1 tablet (325 mg total) by mouth daily.   folic acid 1 MG tablet Commonly known as: FOLVITE Take 1 mg by mouth daily.   HYDROcodone-acetaminophen 7.5-325 MG tablet Commonly known as: NORCO Take 1 tablet by  mouth every 6 (six) hours as needed for moderate pain. Replaces: HYDROcodone-acetaminophen 5-325 MG tablet Started by: Kathlene November, MD   latanoprost 0.005 % ophthalmic solution Commonly known as: XALATAN PLACE 1 DROP INTO BOTH EYES AT BEDTIME.   metFORMIN 1000 MG tablet Commonly known as: GLUCOPHAGE Take 1,000 mg by mouth daily with breakfast.   metoprolol succinate 50 MG 24 hr tablet Commonly known as: TOPROL-XL Take 1 tablet (50 mg total) by mouth daily. NEEDS APPT   pantoprazole 40 MG tablet Commonly known as: PROTONIX TAKE ONE TABLET BY MOUTH DAILY   potassium chloride SA 20 MEQ tablet Commonly known as: KLOR-CON Take 20 mEq by mouth 3 (three) times daily.   rosuvastatin 20 MG tablet Commonly known as: CRESTOR Take 1 tablet (20 mg total) by mouth daily.   torsemide 20 MG tablet Commonly known as: DEMADEX Take 2 tablets (40 mg total) by mouth daily.   traZODone 50 MG tablet Commonly known as: DESYREL TAKE 1/2 TO 1 TABLET BY MOUTH EVERY NIGHT AT BEDTIME AS NEEDED FOR SLEEP   warfarin 3 MG tablet Commonly known as: COUMADIN Take as directed by the anticoagulation clinic. If you are unsure how to take this medication, talk to your nurse or doctor. Original instructions: TAKE 1/2 TO 1 TABLET BY MOUTH DAILY AS DIRECTED          Objective:   Physical Exam BP 106/67 (BP Location: Right Arm, Patient Position: Sitting, Cuff Size: Large)   Pulse 76   Temp 97.7 F (36.5 C) (Oral)   Ht _0  (1.575 m)   Wt 169 lb 6.4 oz (76.8 kg)   SpO2 98%   BMI 30.98 kg/m  General:   Well developed, sitting in a wheelchair, she is tearful, she asked me to leave the door open because she has claustrophobia. HEENT:  Normocephalic . Face symmetric, atraumatic MSK: Left arm on a sling Neurologic:  alert & oriented X3.  Speech normal, gait appropriate for age and unassisted Psych--  Cognition and judgment appear intact.  Cooperative with normal attention span and concentration.    Behavior appropriate.  Extremely anxious appearing     Assessment     74 year old female, PMH includes DM, HTN, CAD, CHF, neuropathy, depression, aortic stenosis, s/p Saint Jude mechanical valve, on Fosamax, anticoagulated presents for ER follow-up.  Anxiety, claustrophobia: The patient reports extreme claustrophobia, this has been exacerbated by the recent shoulder fracture requiring a sling. She is not able to be active or move freely, claustrophobic feelings increased significantly along with her anxiety levels. She already finished hydromorphone Rx at the ER for shoulder fracture but  states the hydrocodone helps better although has been taking more than usual ~ 4 tablets daily.. She self increase BuSpar from 1 tablet twice daily to 2 tablets "every 5 hours" due to increased anxiety. Plan: Stop hydromorphone, continue hydrocodone, her usual dose is hydrocodone 5 mg, increase to 7.5 mg 4 times a day, Rx sent.  Once acute pain is better, recommend to go back to 5 mg only Buspirone: Rec to   avoid excessive doses, 1 tablet 3 times a day is okay.  We will stick with that dose. Start clonazepam to help anxiety and claustrophobia, see prescription, I talk with the patient and her son who is present about the risk of falls.  Her son assures me that she is under supervision constantly.  The patient and the son accept the risk of increased falls. Shoulder fracture: Explained the patient quite important to keep the sling, she has a follow-up with Ortho in a couple of days  Time spent with the patient 35 minutes.,  Extensive listening therapy provided, ER records reviewed, discussed risk of further falls with new prescriptions. This visit occurred during the SARS-CoV-2 public health emergency.  Safety protocols were in place, including screening questions prior to the visit, additional usage of staff PPE, and extensive cleaning of exam room while observing appropriate contact time as indicated for  disinfecting solutions.

## 2020-04-23 DIAGNOSIS — S42202A Unspecified fracture of upper end of left humerus, initial encounter for closed fracture: Secondary | ICD-10-CM | POA: Diagnosis not present

## 2020-05-01 ENCOUNTER — Telehealth: Payer: Self-pay | Admitting: Medical

## 2020-05-01 NOTE — Telephone Encounter (Signed)
Pt needs virtual visit or office visit tomorrow or Monday. Her last rx of pain med was norco 7.5 mg. Would not recommend 2 tab at that dose. So need to discuss.

## 2020-05-01 NOTE — Telephone Encounter (Signed)
VV made

## 2020-05-01 NOTE — Telephone Encounter (Signed)
Patient states she had a fall and broke her shoulder. Patient states she has been taking 2 pills every 6 hours for pain.   Need a refill on  Hydrocodone-acetaminophen 5-325 MG   She would like her dosage to be change to take 2 tablet every 4- 6 hours for pain  [facility] [place], [address]  [address] [place] [address]  Phone:  [phone] Fax:  [phone]

## 2020-05-02 ENCOUNTER — Telehealth: Payer: Self-pay | Admitting: Medical

## 2020-05-02 ENCOUNTER — Other Ambulatory Visit: Payer: Self-pay

## 2020-05-02 ENCOUNTER — Telehealth (INDEPENDENT_AMBULATORY_CARE_PROVIDER_SITE_OTHER): Payer: Medicare Other | Admitting: Medical

## 2020-05-02 DIAGNOSIS — F419 Anxiety disorder, unspecified: Secondary | ICD-10-CM | POA: Diagnosis not present

## 2020-05-02 DIAGNOSIS — S42445D Nondisplaced fracture (avulsion) of medial epicondyle of left humerus, subsequent encounter for fracture with routine healing: Secondary | ICD-10-CM

## 2020-05-02 DIAGNOSIS — F32A Depression, unspecified: Secondary | ICD-10-CM

## 2020-05-02 DIAGNOSIS — M79602 Pain in left arm: Secondary | ICD-10-CM

## 2020-05-02 MED ORDER — BUSPIRONE HCL 15 MG PO TABS: 15.0000 mg | ORAL_TABLET | Freq: Three times a day (TID) | ORAL | 0 refills | Status: DC

## 2020-05-02 MED ORDER — HYDROCODONE-ACETAMINOPHEN 5-325 MG PO TABS
1.0000 | ORAL_TABLET | Freq: Four times a day (QID) | ORAL | 0 refills | Status: DC | PRN
Start: 2020-05-02 — End: 2020-07-14

## 2020-05-02 NOTE — Telephone Encounter (Signed)
Appt made

## 2020-05-02 NOTE — Patient Instructions (Addendum)
Patient has had recent left humerus fracture with severe pain over the past 2 weeks. Reports when using Norco 5/325 2 tablets every 8 hours that was adequate for pain control. But recently ran out and pain is extreme. Patient has follow-up with orthopedist. No surgery was needed. Per patient report she was told that the bone is healing appropriately.  Patient has recently had increased depression and anxiety with severe pain. She is having some transient thoughts of harm to herself when pain is extreme. Because of this I did talk with husband and patient directly and explained that if she has persisting thoughts and pain not being controlled over the weekend then be evaluated at the [facility] emergency department. Patient and husband expressed understanding.  Sent BuSpar 15 mg prescription to pharmacy. She can use 1 tablet every 8 hours as needed for anxiety. Patient was given clonazepam briefly after initial fracture. However in the past I have not wanted her to be on that long-term due to benzo/narcotic potential interaction/side effects. So advised patient stay with BuSpar.  Did have to call patient's pharmacy since he was reporting that there was a prescription ready for her for Norco at [facility]. I called and found out that it was a 7.5 mg prescription from the orthopedist. After discussion with pharmacist we decided to fill the 5/325 prescription that I sent. Pharmacist is going to put on hold the 7.5 mg Rx from orthopedist..  Follow-up Tuesday with myself or ED sooner if needed.

## 2020-05-02 NOTE — Telephone Encounter (Signed)
Please get pt scheduled for follow up on Tuesday. 40 minute appointmet.

## 2020-05-02 NOTE — Progress Notes (Signed)
Subjective:    Patient ID: Abigail Oliver, female    DOB: [birth year], 74 y.o.   MRN: [id number]  HPI Virtual Visit via Video Note  I connected with Abigail Oliver on 05/02/20 at 11:20 AM EST by a video enabled telemedicine application and verified that I am speaking with the correct person using two identifiers.  Location:  Patient: home Provider: office   I discussed the limitations of evaluation and management by telemedicine and the availability of in person appointments. The patient expressed understanding and agreed to proceed.  Did not take vital signs.  History of Present Illness:  Pt states has severe pain  in left upper extremity/humerus area. Pt state not able to sleep due to severe pain. Pt has been on hydrocodone in th past.   Pt did try 2 of norco 5/325 mg and states her pain was controlled. She does not have any further 7.18m tab per pt report.   Pt saw ED after fall and then followed up with Dr. SOnnie Grahamlast Wednesday. Xray done post fall.   HPI from the ED.  "Patient presents after mechanical fall.  States she was getting out of her car.  States she taps her feet because of her neuropathy.  States she still fell forward.  Hitting her head left knee and left shoulder.  Complaining mostly of pain in left shoulder.  However patient is on Coumadin for mechanical aortic valve.  No numbness weakness.  No loss conscious.  Just severe pain in left shoulder.  No chest or abdominal pain.  Has been ambulatory since the event.  Patient with fall.  On anticoagulation.  Head CT reassuring.  Cervical spine is clinically cleared.  No intracranial hemorrhage.  However does have proximal left humerus fracture.  Discussed with orthopedic surgery, Abigail Oliver  Recommends follow-up with Dr. SOnnie Grahamor Dr. RStann Oliver"   Pt states depression is worse recently with severe pain in humerus.   Pt does not have a walker.    Review of Systems  Constitutional: Positive for fatigue.  Negative for chills and fever.  Respiratory: Negative for choking, chest tightness, shortness of breath and wheezing.   Cardiovascular: Negative for chest pain and palpitations.  Gastrointestinal: Negative for abdominal pain.  Musculoskeletal: Negative for back pain.       Left humerus area pain.  Skin: Negative for rash.  Neurological: Negative for dizziness and numbness.  Hematological: Negative for adenopathy. Does not bruise/bleed easily.  Psychiatric/Behavioral: Positive for suicidal ideas. Negative for behavioral problems, decreased concentration and dysphoric mood. The patient is nervous/anxious.        Transient thought of harm to self when pain is not controlled.    Past Medical History:  Diagnosis Date  . Anemia   . Anxiety   . Aortic stenosis    a. Now has St Jude mechanical aortic valve (6/00).b. Echo (6/15) with EF 60-65%, mechanical aortic valve with mean gradient 27 mmHg.  . Arthritis    a. Possible c-spine arthritis with pain down left arm.   . Carotid artery disease ([place]    a. Carotid dopplers (7/16) with 40-59% BICA stenosis.   . Chronic angle-closure glaucoma(365.23)   . Chronic diastolic CHF (congestive heart failure) ([place]    a.  RHC (7/15) with mean RA 2, PA 22/11, mean PCWP 9, CI 3.79.  .Marland KitchenColon polyps   . Contrast media allergy   . Coronary atherosclerosis of native coronary artery    a. CABG at time of AVR  in 6/00 with RIMA-RCA.b. Abnl nuc 11/2013 -> LHC (7/15) with patent RIMA-RCA, 80% mRCA, 80% pLAD with FFR 0.73, treated with DES to pLAD.  Marland Kitchen Depression   . Dizziness 02/09/2012   a. Holter (8/14) with few PACs, otherwise unremarkable.   . Elevated transaminase level   . Essential hypertension   . Gastritis   . GERD (gastroesophageal reflux disease)   . Hyperlipidemia   . Low back pain   . Mechanical heart valve present   . Neuromuscular disorder (HCC)    neuropathy  . Peripheral vascular disease ([place])    a, H/o Right SFA stent. b. Peripheral  arterial dopplers (7/16) with right SFA stent patent.   Marland Kitchen PONV (postoperative nausea and vomiting)    N&V  . Pulmonary nodules    a. Noted by CT in 6/15. CT chest (8/15) was thought to indicate that nodules were benign. No further workup was recommended.   . Syncope 03/29/2012   carotid doppler - R ICA 50-69% reduction by velocities (low end); L ICA 0-49% reduction by velocities (low end); R and L subclavian arteries - <50% redcution; R and L vertebral arteries show normal antegrade flow  . Type II diabetes mellitus ([place])      Social History   Socioeconomic History  . Marital status: Married    Spouse name: Abigail Oliver  . Number of children: 2  . Years of education: 33  . Highest education level: Not on file  Occupational History    Comment: hair dresser  Tobacco Use  . Smoking status: Never Smoker  . Smokeless tobacco: Never Used  Substance and Sexual Activity  . Alcohol use: No  . Drug use: No  . Sexual activity: Never  Other Topics Concern  . Not on file  Social History Narrative   Pt is a high school graduate with 2 years of college. Married in 1967 she has 1 son born 20 and 1 daughter born 61 and 1 grandchild. 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      Caffeine use- 2-3 /day, soda, tea       Lives with husband one story home   Social Determinants of Health   Financial Resource Strain: Not on file  Food Insecurity: Not on file  Transportation Needs: Not on file  Physical Activity: Not on file  Stress: Not on file  Social Connections: Not on file  Intimate Partner Violence: Not on file    Past Surgical History:  Procedure Laterality Date  . BILATERAL OOPHORECTOMY  1987  . BREAST BIOPSY  2012   left  . CARDIAC CATHETERIZATION  01/14/1999   normal LV function, severe aortic stenosis; 80% and 70% stenosis in RCA; mild 20% distal norrowing in L main with 20% proximal LAD stenosis, 40% diagonal stenosis and 20% proximal circumflex stenosis   . CARDIAC VALVE REPLACEMENT  2000   aortic valve   . CARDIOVASCULAR STRESS TEST  08/25/2011   R/L MV - EF 72%; no scintigraphic evidence of inducible MI; normal perfusionTID of 1.25 elevated - could indicate small vessle subendocardial ischemia; EKG NSR at 66, non diagnostic for ischemia  . [place]   bilaterally  . CATARACT EXTRACTION W/ INTRAOCULAR LENS  IMPLANT, BILATERAL  ~ 2007  . [place]; 1971  . CHOLECYSTECTOMY  1990  . COLONOSCOPY N/A 10/27/2012   Procedure: COLONOSCOPY;  Surgeon: Beryle Beams, MD;  Location: [facility];  Service: Endoscopy;  Laterality:  N/A;  . CORONARY ARTERY BYPASS GRAFT  2000   RIMA to RCA  . DOPPLER ECHOCARDIOGRAPHY  03/29/2012   EF >55%; mild concentric LVH; stage 1 diastolic dysfunction, elevated LV filling pressure, dilated LA; MAC mild MR; St Jude AVR peak and mean gradients of 83mHg and 245mg; transvalvular gradients have increased (prev 23 and 14 respectively)  . ESOPHAGOGASTRODUODENOSCOPY N/A 10/27/2012   Procedure: ESOPHAGOGASTRODUODENOSCOPY (EGD);  Surgeon: PaBeryle BeamsMD;  [facility];  Service: Endoscopy;  Laterality: N/A;  . FEMORAL ARTERY STENT  09/20/2011   6 x 40 Smart Nitinol self-expanding stent placed;  10/15/2031 -R SFA stent open and patent w/o evidence of restenosis  . FRACTIONAL FLOW RESERVE WIRE  12/14/2013   Procedure: FRACTIONAL FLOW RESERVE WIRE;  Surgeon: DaLarey DresserMD;  [facility];  Service: Cardiovascular;;  . LACERATION REPAIR     right hand  . LEFT AND RIGHT HEART CATHETERIZATION WITH CORONARY ANGIOGRAM N/A 12/14/2013   Procedure: LEFT AND RIGHT HEART CATHETERIZATION WITH CORONARY ANGIOGRAM;  Surgeon: DaLarey DresserMD;  [facility];  Service: Cardiovascular;  Laterality: N/A;  . LOWER EXTREMITY ANGIOGRAM N/A 09/20/2011   Procedure: LOWER EXTREMITY ANGIOGRAM;  Surgeon: JoLorretta HarpMD;  [facility];  Service: Cardiovascular;  Laterality: N/A;   . LYMPH NODE BIOPSY  06/2011   "core needle on 5"  . needle biopsy  2009   "on ankles for nerve damage"  . NEUROPLASTY / TRANSPOSITION ULNAR NERVE AT ELBOW     right  . PERCUTANEOUS CORONARY STENT INTERVENTION (PCI-S)  12/14/2013   Procedure: PERCUTANEOUS CORONARY STENT INTERVENTION (PCI-S);  Surgeon: DaLarey DresserMD;  [facility];  Service: Cardiovascular;;  Prox LAD 3.00x12 Promus DES   . PERIPHERAL ARTERIAL STENT GRAFT  09/20/11   right SFA  . REFRACTIVE SURGERY  ` 2004   "for glaucoma"  . TONSILLECTOMY  1968  . TRIGGER FINGER RELEASE Left 12/04/2015   Procedure: LEFT RING FINGER TRIGGER RELEASE;  Surgeon: KeLeanora CoverMD;  [facility]: [facility];  Laterality: Left;  . Marland KitchenAGINAL HYSTERECTOMY  1985   Fibroids    Family History  Problem Relation Age of Onset  . Colon cancer Mother   . COPD Mother   . Emphysema Mother   . Cancer Maternal Grandmother   . Cancer Maternal Grandfather   . Heart disease Brother   . Diabetes Neg Hx     Allergies  Allergen Reactions  . Atorvastatin     Muscle pain  . Simvastatin Other (See Comments)    Muscle pain  . Sulfa Antibiotics     unknown  . Iodinated Diagnostic Agents Rash     Red rash after cardiac cath 1 wk ago, ? Contrast allergy, requires 13 hr prep now per [email or web address]  Red rash after cardiac cath 1 wk ago, ? Contrast allergy, requires 13 hr prep now per [email or web address]    Current Outpatient Medications on File Prior to Visit  Medication Sig Dispense Refill  . alendronate (FOSAMAX) 10 MG tablet Take 1 tablet (10 mg total) by mouth daily before breakfast. Take with a full glass of water on an empty stomach. 30 tablet 11  . ascorbic acid (VITAMIN C) 500 MG tablet Take 500 mg by mouth as needed.     . Marland Kitchenspirin EC 81 MG tablet Take 1 tablet (81 mg total) by mouth daily.    . busPIRone (BUSPAR) 7.5 MG tablet Take 1  tablet (7.5 mg total) by mouth 3 (three) times daily.  60 tablet   . calcium-vitamin D (CALCIUM 500+D) 500-200 MG-UNIT per tablet Take 1 tablet by mouth 2 (two) times daily.     . clonazePAM (KLONOPIN) 0.5 MG tablet Take 0.5-1 tablets (0.25-0.5 mg total) by mouth 3 (three) times daily as needed for anxiety. 30 tablet 0  . dicyclomine (BENTYL) 10 MG capsule TAKE ONE CAPSULE BY MOUTH THREE TIMES A DAY BEFORE MEALS 90 capsule 0  . FARXIGA 10 MG TABS tablet Take 10 mg by mouth daily.    . ferrous sulfate 325 (65 FE) MG tablet Take 1 tablet (325 mg total) by mouth daily. 30 tablet 0  . folic acid (FOLVITE) 1 MG tablet Take 1 mg by mouth daily.    Marland Kitchen latanoprost (XALATAN) 0.005 % ophthalmic solution PLACE 1 DROP INTO BOTH EYES AT BEDTIME. 2.5 mL 2  . metFORMIN (GLUCOPHAGE) 1000 MG tablet Take 1,000 mg by mouth daily with breakfast.     . metoprolol succinate (TOPROL-XL) 50 MG 24 hr tablet Take 1 tablet (50 mg total) by mouth daily. NEEDS APPT 90 tablet 3  . pantoprazole (PROTONIX) 40 MG tablet TAKE ONE TABLET BY MOUTH DAILY 72 tablet 9  . potassium chloride SA (K-DUR) 20 MEQ tablet Take 20 mEq by mouth 3 (three) times daily.    . rosuvastatin (CRESTOR) 20 MG tablet Take 1 tablet (20 mg total) by mouth daily. 90 tablet 3  . torsemide (DEMADEX) 20 MG tablet Take 2 tablets (40 mg total) by mouth daily. 60 tablet 6  . traZODone (DESYREL) 50 MG tablet TAKE 1/2 TO 1 TABLET BY MOUTH EVERY NIGHT AT BEDTIME AS NEEDED FOR SLEEP 30 tablet 0  . warfarin (COUMADIN) 3 MG tablet TAKE 1/2 TO 1 TABLET BY MOUTH DAILY AS DIRECTED 40 tablet 1   No current facility-administered medications on file prior to visit.    There were no vitals taken for this visit.     Objective:   Physical Exam  General-appears very tired and sad. Lungs- on inspection lungs appear unlabored. Neck- no tracheal deviation or jvd on inspection. Neuro- gross motor function appears intact.      Assessment & Plan:  Patient has had recent left humerus fracture with severe pain over the past 2  weeks. Reports when using Norco 5/325 2 tablets every 8 hours that was adequate for pain control. But recently ran out and pain is extreme. Patient has follow-up with orthopedist. No surgery was needed. Per patient report she was told that the bone is healing appropriately.  Patient has recently had increased depression and anxiety with severe pain. She is having some transient thoughts of harm to herself when pain is extreme. Because of this I did talk with husband and patient directly and explained that if she has persisting thoughts and pain not being controlled over the weekend then be evaluated at the [facility] emergency department. Patient and husband expressed understanding.  Sent BuSpar 15 mg prescription to pharmacy. She can use 1 tablet every 8 hours as needed for anxiety. Patient was given clonazepam briefly after initial fracture. However in the past I have not wanted her to be on that long-term due to benzo/narcotic potential interaction/side effects. So advised patient stay with BuSpar.  Did have to call patient's pharmacy since he was reporting that there was a prescription ready for her for Norco at [facility]. I called and found out that it was a 7.5 mg prescription from  the orthopedist. After discussion with pharmacist we decided to fill the 5/325 prescription that I sent. Pharmacist is going to put on hold the 7.5 mg Rx from orthopedist..  Follow-up Tuesday with myself or ED sooner if needed.  Time spent with patient today was 45  minutes which consisted of chart revdiew, discussing diagnosis, work up treatment and documentation.

## 2020-05-06 ENCOUNTER — Ambulatory Visit: Payer: Medicare Other | Admitting: Medical

## 2020-05-06 DIAGNOSIS — S42202A Unspecified fracture of upper end of left humerus, initial encounter for closed fracture: Secondary | ICD-10-CM | POA: Diagnosis not present

## 2020-05-07 ENCOUNTER — Ambulatory Visit (INDEPENDENT_AMBULATORY_CARE_PROVIDER_SITE_OTHER): Payer: Medicare Other

## 2020-05-07 ENCOUNTER — Other Ambulatory Visit: Payer: Self-pay

## 2020-05-07 DIAGNOSIS — Z7901 Long term (current) use of anticoagulants: Secondary | ICD-10-CM

## 2020-05-07 DIAGNOSIS — Z952 Presence of prosthetic heart valve: Secondary | ICD-10-CM

## 2020-05-07 LAB — POCT INR: INR: 3.3 — AB (ref 2.0–3.0)

## 2020-05-07 NOTE — Patient Instructions (Signed)
Continue with 1 tablet daily except 1/2 tablet each Tuesday, Thursday and Saturday.  Repeat INR in 6 weeks.

## 2020-05-09 ENCOUNTER — Telehealth: Payer: Self-pay | Admitting: Medical

## 2020-05-09 ENCOUNTER — Telehealth: Payer: Self-pay

## 2020-05-09 ENCOUNTER — Other Ambulatory Visit: Payer: Self-pay | Admitting: Medical

## 2020-05-09 ENCOUNTER — Telehealth (INDEPENDENT_AMBULATORY_CARE_PROVIDER_SITE_OTHER): Payer: Medicare Other | Admitting: Medical

## 2020-05-09 ENCOUNTER — Ambulatory Visit (HOSPITAL_BASED_OUTPATIENT_CLINIC_OR_DEPARTMENT_OTHER)
Admission: RE | Admit: 2020-05-09 | Discharge: 2020-05-09 | Disposition: A | Discharge status: Home / Self Care | Payer: Medicare Other | Source: Ambulatory Visit | Attending: Medical | Admitting: Medical

## 2020-05-09 ENCOUNTER — Encounter (HOSPITAL_BASED_OUTPATIENT_CLINIC_OR_DEPARTMENT_OTHER): Payer: Self-pay

## 2020-05-09 ENCOUNTER — Other Ambulatory Visit: Payer: Self-pay

## 2020-05-09 VITALS — Wt 176.0 lb

## 2020-05-09 DIAGNOSIS — I509 Heart failure, unspecified: Secondary | ICD-10-CM

## 2020-05-09 DIAGNOSIS — F32A Depression, unspecified: Secondary | ICD-10-CM | POA: Diagnosis not present

## 2020-05-09 DIAGNOSIS — F419 Anxiety disorder, unspecified: Secondary | ICD-10-CM | POA: Diagnosis not present

## 2020-05-09 DIAGNOSIS — R5383 Other fatigue: Secondary | ICD-10-CM | POA: Diagnosis not present

## 2020-05-09 LAB — COMPREHENSIVE METABOLIC PANEL
AG Ratio: 1.7 (calc) (ref 1.0–2.5)
ALT: 8 U/L (ref 6–29)
AST: 15 U/L (ref 10–35)
Albumin: 4 g/dL (ref 3.6–5.1)
Alkaline phosphatase (APISO): 98 U/L (ref 37–153)
BUN/Creatinine Ratio: 11 (calc) (ref 6–22)
BUN: 15 mg/dL (ref 7–25)
CO2: 28 mmol/L (ref 20–32)
Calcium: 8.9 mg/dL (ref 8.6–10.4)
Chloride: 105 mmol/L (ref 98–110)
Creat: 1.31 mg/dL — ABNORMAL HIGH (ref 0.60–0.93)
Globulin: 2.3 g/dL (calc) (ref 1.9–3.7)
Glucose, Bld: 107 mg/dL — ABNORMAL HIGH (ref 65–99)
Potassium: 4.2 mmol/L (ref 3.5–5.3)
Sodium: 140 mmol/L (ref 135–146)
Total Bilirubin: 1.2 mg/dL (ref 0.2–1.2)
Total Protein: 6.3 g/dL (ref 6.1–8.1)

## 2020-05-09 LAB — BRAIN NATRIURETIC PEPTIDE: Brain Natriuretic Peptide: 606 pg/mL — ABNORMAL HIGH (ref <100)

## 2020-05-09 MED ORDER — SERTRALINE HCL 25 MG PO TABS: 25.0000 mg | ORAL_TABLET | Freq: Every day | ORAL | 1 refills | Status: DC

## 2020-05-09 MED ORDER — CLONAZEPAM 0.5 MG PO TABS: ORAL_TABLET | ORAL | 0 refills | Status: DC

## 2020-05-09 NOTE — Progress Notes (Signed)
Subjective:    Patient ID: Abigail Oliver, female    DOB: 08-13-1945, 74 y.o.   MRN: [id number]  HPI  Virtual Visit via Video Note  I connected with Abigail Oliver on 05/09/20 at  1:20 PM EST by a video enabled telemedicine application and verified that I am speaking with the correct person using two identifiers.  Location: Patient: home Provider: office   I discussed the limitations of evaluation and management by telemedicine and the availability of in person appointments. The patient expressed understanding and agreed to proceed.  History of Present Illness:   Pt states she is distraught about her mood and anxiety.    Pt very frustrated and anxiety with having to stay home. She is anxious and having insomnia. Pt feels frustrated that she is isolated since not working. Pt does have trazadone. She has been taking nightly. Only helps her sleep for about one hour.Pt has been on clonazepam in past for anxiety. Pt states buspar is not helping.    Pt is in process of fracture healing. Pt saw orthopedist on Tuesday. Pt indicates that her pain is less. She states pain is know" healing pain". Pt stopped pain meds over last 4-5 days. Pt got 3 day rx of norco. She has not used.  Patient also has a history of CHF.  She notes that recently extensively had pain related to the fracture that she was purposefully not drinking much fluids due to the fact that she had to use the bathroom it was difficult for her to do sod ifficulty getting dressed.  She also stopped using her diuretics up until recently when she restarted them recently.        Observations/Objective: General-no acute distress, pleasant, oriented. Lungs- on inspection lungs appear unlabored. Neck- no tracheal deviation or jvd on inspection. Neuro- gross motor function appears intact.  Assessment and Plan: History of anxiety and depression.  Recently made worse with fracture and her inability to work.  No longer  requiring pain medication.  I do think it safe for her to presently use clonazepam.  She is going to stop BuSpar since is not working for the anxiety.  Prescribe sertraline low dose as well.  She will continue trazodone at night.  History of CHF with some weight gain.  Patient has been off of her diuretics recently.  Placed stat CMP, BNP and chest x-ray.   Follow-up in 2 weeks or as needed  Follow Up Instructions:    I discussed the assessment and treatment plan with the patient. The patient was provided an opportunity to ask questions and all were answered. The patient agreed with the plan and demonstrated an understanding of the instructions.   The patient was advised to call back or seek an in-person evaluation if the symptoms worsen or if the condition fails to improve as anticipated.  Time spent with patient today was 45  minutes which consisted of chart rediew, discussing diagnosis, work up, treatment, counseling on mood and then reviewing her CHF history and documentation.    [name], PA-C    Review of Systems  Constitutional: Positive for unexpected weight change. Negative for chills, fatigue and fever.  HENT: Negative for congestion.   Respiratory: Negative for cough, chest tightness, shortness of breath and wheezing.   Cardiovascular: Negative for chest pain and palpitations.  Gastrointestinal: Negative for abdominal pain.  Genitourinary: Negative for dysuria.  Musculoskeletal: Negative for back pain.  Hematological: Negative for adenopathy.  Psychiatric/Behavioral: Positive for dysphoric  mood and sleep disturbance. Negative for behavioral problems and suicidal ideas. The patient is nervous/anxious.        Objective:   Physical Exam        Assessment & Plan:

## 2020-05-09 NOTE — Telephone Encounter (Signed)
Opened to review 

## 2020-05-09 NOTE — Telephone Encounter (Signed)
Patient states topical cream for rash has not been sent to pharmacy

## 2020-05-09 NOTE — Telephone Encounter (Signed)
Please see my chart result note response.

## 2020-05-09 NOTE — Telephone Encounter (Signed)
Requesting: Clonazepam Contract:03/30/2018 UDS:03/25/2020 Last Visit:04/21/2020 Next Visit:05/09/2020 video visit Last Refill:today  Responded to by other means.   Please Advise

## 2020-05-09 NOTE — Patient Instructions (Signed)
History of anxiety and depression.  Recently made worse with fracture and her inability to work.  No longer requiring pain medication.  I do think it safe for her to presently use clonazepam.  She is going to stop BuSpar since is not working for the anxiety.  Prescribe sertraline low dose as well.  She will continue trazodone at night.  History of CHF with some weight gain.  Patient has been off of her diuretics recently.  Placed stat CMP, BNP and chest x-ray.   Follow-up in 2 weeks or as needed

## 2020-05-19 ENCOUNTER — Other Ambulatory Visit: Payer: Self-pay | Admitting: Medical

## 2020-05-28 ENCOUNTER — Other Ambulatory Visit: Payer: Self-pay | Admitting: Medical

## 2020-06-03 DIAGNOSIS — S42202D Unspecified fracture of upper end of left humerus, subsequent encounter for fracture with routine healing: Secondary | ICD-10-CM | POA: Diagnosis not present

## 2020-06-03 DIAGNOSIS — M25612 Stiffness of left shoulder, not elsewhere classified: Secondary | ICD-10-CM | POA: Diagnosis not present

## 2020-06-03 DIAGNOSIS — M25512 Pain in left shoulder: Secondary | ICD-10-CM | POA: Diagnosis not present

## 2020-06-03 DIAGNOSIS — M25412 Effusion, left shoulder: Secondary | ICD-10-CM | POA: Diagnosis not present

## 2020-06-05 DIAGNOSIS — M25612 Stiffness of left shoulder, not elsewhere classified: Secondary | ICD-10-CM | POA: Diagnosis not present

## 2020-06-05 DIAGNOSIS — D509 Iron deficiency anemia, unspecified: Secondary | ICD-10-CM | POA: Diagnosis not present

## 2020-06-05 DIAGNOSIS — E785 Hyperlipidemia, unspecified: Secondary | ICD-10-CM | POA: Diagnosis not present

## 2020-06-05 DIAGNOSIS — N183 Chronic kidney disease, stage 3 unspecified: Secondary | ICD-10-CM | POA: Diagnosis not present

## 2020-06-05 DIAGNOSIS — D631 Anemia in chronic kidney disease: Secondary | ICD-10-CM | POA: Diagnosis not present

## 2020-06-05 DIAGNOSIS — I5030 Unspecified diastolic (congestive) heart failure: Secondary | ICD-10-CM | POA: Diagnosis not present

## 2020-06-05 DIAGNOSIS — E559 Vitamin D deficiency, unspecified: Secondary | ICD-10-CM | POA: Diagnosis not present

## 2020-06-05 DIAGNOSIS — E1122 Type 2 diabetes mellitus with diabetic chronic kidney disease: Secondary | ICD-10-CM | POA: Diagnosis not present

## 2020-06-05 DIAGNOSIS — S42202D Unspecified fracture of upper end of left humerus, subsequent encounter for fracture with routine healing: Secondary | ICD-10-CM | POA: Diagnosis not present

## 2020-06-05 DIAGNOSIS — M25512 Pain in left shoulder: Secondary | ICD-10-CM | POA: Diagnosis not present

## 2020-06-05 DIAGNOSIS — M25412 Effusion, left shoulder: Secondary | ICD-10-CM | POA: Diagnosis not present

## 2020-06-05 DIAGNOSIS — E1140 Type 2 diabetes mellitus with diabetic neuropathy, unspecified: Secondary | ICD-10-CM | POA: Diagnosis not present

## 2020-06-08 ENCOUNTER — Other Ambulatory Visit: Payer: Self-pay | Admitting: Cardiology

## 2020-06-10 ENCOUNTER — Other Ambulatory Visit: Payer: Self-pay | Admitting: Internal Medicine

## 2020-06-10 DIAGNOSIS — N183 Chronic kidney disease, stage 3 unspecified: Secondary | ICD-10-CM

## 2020-06-11 DIAGNOSIS — S42202D Unspecified fracture of upper end of left humerus, subsequent encounter for fracture with routine healing: Secondary | ICD-10-CM | POA: Diagnosis not present

## 2020-06-11 DIAGNOSIS — M25412 Effusion, left shoulder: Secondary | ICD-10-CM | POA: Diagnosis not present

## 2020-06-11 DIAGNOSIS — M25612 Stiffness of left shoulder, not elsewhere classified: Secondary | ICD-10-CM | POA: Diagnosis not present

## 2020-06-11 DIAGNOSIS — M25512 Pain in left shoulder: Secondary | ICD-10-CM | POA: Diagnosis not present

## 2020-06-12 DIAGNOSIS — M25412 Effusion, left shoulder: Secondary | ICD-10-CM | POA: Diagnosis not present

## 2020-06-12 DIAGNOSIS — S42202D Unspecified fracture of upper end of left humerus, subsequent encounter for fracture with routine healing: Secondary | ICD-10-CM | POA: Diagnosis not present

## 2020-06-12 DIAGNOSIS — M25612 Stiffness of left shoulder, not elsewhere classified: Secondary | ICD-10-CM | POA: Diagnosis not present

## 2020-06-12 DIAGNOSIS — M25512 Pain in left shoulder: Secondary | ICD-10-CM | POA: Diagnosis not present

## 2020-06-13 DIAGNOSIS — M25562 Pain in left knee: Secondary | ICD-10-CM | POA: Diagnosis not present

## 2020-06-17 ENCOUNTER — Other Ambulatory Visit: Payer: Self-pay

## 2020-06-17 ENCOUNTER — Ambulatory Visit (INDEPENDENT_AMBULATORY_CARE_PROVIDER_SITE_OTHER): Payer: Medicare Other | Admitting: Pharmacist Clinician (PhC)/ Clinical Pharmacy Specialist

## 2020-06-17 DIAGNOSIS — Z952 Presence of prosthetic heart valve: Secondary | ICD-10-CM

## 2020-06-17 DIAGNOSIS — M25512 Pain in left shoulder: Secondary | ICD-10-CM | POA: Diagnosis not present

## 2020-06-17 DIAGNOSIS — M25412 Effusion, left shoulder: Secondary | ICD-10-CM | POA: Diagnosis not present

## 2020-06-17 DIAGNOSIS — S42202D Unspecified fracture of upper end of left humerus, subsequent encounter for fracture with routine healing: Secondary | ICD-10-CM | POA: Diagnosis not present

## 2020-06-17 DIAGNOSIS — M25612 Stiffness of left shoulder, not elsewhere classified: Secondary | ICD-10-CM | POA: Diagnosis not present

## 2020-06-17 DIAGNOSIS — Z7901 Long term (current) use of anticoagulants: Secondary | ICD-10-CM

## 2020-06-17 LAB — POCT INR: INR: 2.8 (ref 2.0–3.0)

## 2020-06-24 DIAGNOSIS — M25612 Stiffness of left shoulder, not elsewhere classified: Secondary | ICD-10-CM | POA: Diagnosis not present

## 2020-06-24 DIAGNOSIS — M25412 Effusion, left shoulder: Secondary | ICD-10-CM | POA: Diagnosis not present

## 2020-06-24 DIAGNOSIS — S42202D Unspecified fracture of upper end of left humerus, subsequent encounter for fracture with routine healing: Secondary | ICD-10-CM | POA: Diagnosis not present

## 2020-06-24 DIAGNOSIS — M25512 Pain in left shoulder: Secondary | ICD-10-CM | POA: Diagnosis not present

## 2020-06-25 DIAGNOSIS — M25512 Pain in left shoulder: Secondary | ICD-10-CM | POA: Diagnosis not present

## 2020-06-25 DIAGNOSIS — S42202D Unspecified fracture of upper end of left humerus, subsequent encounter for fracture with routine healing: Secondary | ICD-10-CM | POA: Diagnosis not present

## 2020-06-25 DIAGNOSIS — M25612 Stiffness of left shoulder, not elsewhere classified: Secondary | ICD-10-CM | POA: Diagnosis not present

## 2020-06-25 DIAGNOSIS — M25412 Effusion, left shoulder: Secondary | ICD-10-CM | POA: Diagnosis not present

## 2020-06-30 ENCOUNTER — Ambulatory Visit
Admission: RE | Admit: 2020-06-30 | Discharge: 2020-06-30 | Disposition: A | Discharge status: Home / Self Care | Payer: Medicare Other | Source: Ambulatory Visit | Attending: Internal Medicine | Admitting: Internal Medicine

## 2020-06-30 DIAGNOSIS — N189 Chronic kidney disease, unspecified: Secondary | ICD-10-CM | POA: Diagnosis not present

## 2020-06-30 DIAGNOSIS — N183 Chronic kidney disease, stage 3 unspecified: Secondary | ICD-10-CM

## 2020-07-01 DIAGNOSIS — M25612 Stiffness of left shoulder, not elsewhere classified: Secondary | ICD-10-CM | POA: Diagnosis not present

## 2020-07-01 DIAGNOSIS — M25412 Effusion, left shoulder: Secondary | ICD-10-CM | POA: Diagnosis not present

## 2020-07-01 DIAGNOSIS — M25512 Pain in left shoulder: Secondary | ICD-10-CM | POA: Diagnosis not present

## 2020-07-01 DIAGNOSIS — S42202D Unspecified fracture of upper end of left humerus, subsequent encounter for fracture with routine healing: Secondary | ICD-10-CM | POA: Diagnosis not present

## 2020-07-04 ENCOUNTER — Other Ambulatory Visit: Payer: Self-pay | Admitting: Medical

## 2020-07-14 ENCOUNTER — Telehealth: Payer: Self-pay | Admitting: Medical

## 2020-07-14 MED ORDER — TRAZODONE HCL 50 MG PO TABS
25.0000 mg | ORAL_TABLET | Freq: Every evening | ORAL | 3 refills | Status: DC | PRN
Start: 2020-07-14 — End: 2021-01-06

## 2020-07-14 NOTE — Telephone Encounter (Signed)
Medication:  traZODone (DESYREL) 50 MG tablet   Has the patient contacted their pharmacy? No. (If no, request that the patient contact the pharmacy for the refill.) (If yes, when and what did the pharmacy advise?)  Preferred Pharmacy (with phone number or street name): [place], [place]  [address], [place] [address]  Phone:  [phone] Fax:  [phone]  DEA #:  --  Agent: Please be advised that RX refills may take up to 3 business days. We ask that you follow-up with your pharmacy.

## 2020-07-14 NOTE — Telephone Encounter (Signed)
Last med on her controlled med review was norco. I don't see recent tramadol rx. Does her contract list tramadol?

## 2020-07-14 NOTE — Telephone Encounter (Signed)
Rx sent 

## 2020-07-14 NOTE — Telephone Encounter (Signed)
Medication:  traMADol (ULTRAM) 50 MG tablet [[id number]  Has the patient contacted their pharmacy? No. (If no, request that the patient contact the pharmacy for the refill.) (If yes, when and what did the pharmacy advise?)  Preferred Pharmacy (with phone number or street name):  [place], [place] Phone:  [phone]  Fax:  [phone]       Agent: Please be advised that RX refills may take up to 3 business days. We ask that you follow-up with your pharmacy.

## 2020-07-14 NOTE — Telephone Encounter (Signed)
Requesting: tramadol Contract:03/2020 UDS:03/25/2020 Last Visit:04/2020 Next Visit:n/a Last Refill:  Please Advise

## 2020-07-15 NOTE — Telephone Encounter (Signed)
VV made

## 2020-07-15 NOTE — Telephone Encounter (Signed)
No just tNORCO

## 2020-07-15 NOTE — Telephone Encounter (Signed)
Ask her to make a visit. Can be virtual if we only discuss her pain. If she wants to switch to tramadol then need to have new contract. Need update as to what is going on?  Explain she was non norco. Can't prescribe both without knowing current status.

## 2020-07-16 ENCOUNTER — Telehealth (INDEPENDENT_AMBULATORY_CARE_PROVIDER_SITE_OTHER): Payer: Medicare Other | Admitting: Medical

## 2020-07-16 ENCOUNTER — Other Ambulatory Visit: Payer: Self-pay

## 2020-07-16 DIAGNOSIS — G8929 Other chronic pain: Secondary | ICD-10-CM

## 2020-07-16 DIAGNOSIS — M5441 Lumbago with sciatica, right side: Secondary | ICD-10-CM | POA: Diagnosis not present

## 2020-07-16 DIAGNOSIS — M48061 Spinal stenosis, lumbar region without neurogenic claudication: Secondary | ICD-10-CM

## 2020-07-16 DIAGNOSIS — M25559 Pain in unspecified hip: Secondary | ICD-10-CM

## 2020-07-16 MED ORDER — HYDROCODONE-ACETAMINOPHEN 5-325 MG PO TABS: 1.0000 | ORAL_TABLET | Freq: Four times a day (QID) | ORAL | 0 refills | Status: DC | PRN

## 2020-07-16 NOTE — Progress Notes (Signed)
   Subjective:    Patient ID: Abigail Oliver, female    DOB: [birth year], 75 y.o.   MRN: [id number]  HPI  Virtual Visit via Video Note  I connected with [name] on 07/16/20 at 11:00 AM EST by a video enabled telemedicine application and verified that I am speaking with the correct person using two identifiers.  Location: Patient: home Provider: office   I discussed the limitations of evaluation and management by telemedicine and the availability of in person appointments. The patient expressed understanding and agreed to proceed.  History of Present Illness: Pt has been having lower back pain due to hx of back stenosis and rt hip area pain.  Pt had been on tramadol for pain in the past but states did not help. Pt had been on norco for back pain and for shoulder fracture.  Pt has stopped xanax and no longer taking sertraline.  Pt thinks would need norco twice a day. She had asked for tramadol on my chart message.   In the past I had avoided high number of norco when she was on xanax. So she got impression I would not prescribe norco. I explained to her that had caution/concern based on her xanax use.  Pt states high level severe pain and patient got impression from surgeon that surgery not good option for her.    Observations/Objective:  General-no acute distress, pleasant, oriented. Lungs- on inspection lungs appear unlabored. Neck- no tracheal deviation or jvd on inspection. Neuro- gross motor function appears intact.  Assessment and Plan: For chronic back pain with spinal stenosis and right hip pain, I prescribed Norco No. 60 tablets 1 tablet every 6 hours as needed severe pain.  I did explain to patient that the goal would be to use twice daily ice daily max as she thinks that would be adequate.  She does have contract presently and that is a up to date but 30 tab per month.  When she comes in for follow-up will give her 42 tablets/month.  Also patient has  history of high cholesterol diabetes.  She is seeing endocrinologist with a goal physician.  I did ask her to make appointment in a month and to come in fasting so we can check a lipid panel.  Also will need to get you go to send over office notes when I see her.  Also find out from patient appointment will be with Dr. Kelton Pillar as getting records would not be an issue since she is in agreement.  Follow-up in 1 month or as needed.  [name], PA-C  Follow Up Instructions:    I discussed the assessment and treatment plan with the patient. The patient was provided an opportunity to ask questions and all were answered. The patient agreed with the plan and demonstrated an understanding of the instructions.   The patient was advised to call back or seek an in-person evaluation if the symptoms worsen or if the condition fails to improve as anticipated.   Time spent with patient today was 30  minutes which consisted of chart review, discussing diagnosis, contract, controlled med rules treatment and documentation.  [name], PA-C   Review of Systems     Objective:   Physical Exam        Assessment & Plan:

## 2020-07-16 NOTE — Patient Instructions (Addendum)
For chronic back pain with spinal stenosis and right hip pain, I prescribed Norco No. 60 tablets 1 tablet every 6 hours as needed severe pain.  I did explain to patient that the goal would be to use twice daily ice daily max as she thinks that would be adequate.  She does have contract presently and that is a up to date but 30 tab per month.  When she comes in for follow-up will give her 7 tablets/month.  Also patient has history of high cholesterol diabetes.  She is seeing endocrinologist with a goal physician.  I did ask her to make appointment in a month and to come in fasting so we can check a lipid panel.  Also will need to get you go to send over office notes when I see her.  Also find out from patient appointment will be with Dr. Kelton Pillar as getting records would not be an issue since she is in agreement.  Follow-up in 1 month or as needed.

## 2020-07-28 ENCOUNTER — Ambulatory Visit (INDEPENDENT_AMBULATORY_CARE_PROVIDER_SITE_OTHER): Payer: Medicare Other

## 2020-07-28 ENCOUNTER — Other Ambulatory Visit: Payer: Self-pay

## 2020-07-28 DIAGNOSIS — Z7901 Long term (current) use of anticoagulants: Secondary | ICD-10-CM

## 2020-07-28 DIAGNOSIS — Z952 Presence of prosthetic heart valve: Secondary | ICD-10-CM

## 2020-07-28 DIAGNOSIS — Z5181 Encounter for therapeutic drug level monitoring: Secondary | ICD-10-CM

## 2020-07-28 LAB — POCT INR: INR: 1.8 — AB (ref 2.0–3.0)

## 2020-07-28 NOTE — Patient Instructions (Signed)
Take 2 tablets today only and then Continue with 1 tablet daily except 1/2 tablet each Tuesday, Thursday and Saturday.  Repeat INR in 4 weeks. Eat less greens.

## 2020-08-11 ENCOUNTER — Other Ambulatory Visit: Payer: Self-pay | Admitting: Cardiology

## 2020-08-25 ENCOUNTER — Other Ambulatory Visit: Payer: Self-pay

## 2020-08-25 ENCOUNTER — Ambulatory Visit (INDEPENDENT_AMBULATORY_CARE_PROVIDER_SITE_OTHER): Payer: Medicare Other

## 2020-08-25 DIAGNOSIS — Z952 Presence of prosthetic heart valve: Secondary | ICD-10-CM | POA: Diagnosis not present

## 2020-08-25 DIAGNOSIS — Z7901 Long term (current) use of anticoagulants: Secondary | ICD-10-CM

## 2020-08-25 LAB — POCT INR: INR: 2.6 (ref 2.0–3.0)

## 2020-08-25 NOTE — Patient Instructions (Signed)
Continue with 1 tablet daily except 1/2 tablet each Tuesday, Thursday and Saturday.  Repeat INR in 6 weeks. Eat less greens.

## 2020-09-08 ENCOUNTER — Other Ambulatory Visit: Payer: Self-pay | Admitting: Medical

## 2020-09-08 DIAGNOSIS — Z1231 Encounter for screening mammogram for malignant neoplasm of breast: Secondary | ICD-10-CM | POA: Diagnosis not present

## 2020-09-11 ENCOUNTER — Encounter: Payer: Self-pay | Admitting: Medical

## 2020-09-11 DIAGNOSIS — R928 Other abnormal and inconclusive findings on diagnostic imaging of breast: Secondary | ICD-10-CM | POA: Diagnosis not present

## 2020-09-11 DIAGNOSIS — R921 Mammographic calcification found on diagnostic imaging of breast: Secondary | ICD-10-CM | POA: Diagnosis not present

## 2020-09-11 DIAGNOSIS — R922 Inconclusive mammogram: Secondary | ICD-10-CM | POA: Diagnosis not present

## 2020-10-06 ENCOUNTER — Ambulatory Visit (INDEPENDENT_AMBULATORY_CARE_PROVIDER_SITE_OTHER): Payer: Medicare Other

## 2020-10-06 ENCOUNTER — Other Ambulatory Visit: Payer: Self-pay

## 2020-10-06 DIAGNOSIS — Z952 Presence of prosthetic heart valve: Secondary | ICD-10-CM | POA: Diagnosis not present

## 2020-10-06 DIAGNOSIS — Z7901 Long term (current) use of anticoagulants: Secondary | ICD-10-CM

## 2020-10-06 LAB — POCT INR: INR: 2 (ref 2.0–3.0)

## 2020-10-06 NOTE — Patient Instructions (Signed)
Take 2 tablets tonight only and then Continue with 1 tablet daily except 1/2 tablet each Tuesday, Thursday and Saturday.  Repeat INR in 6 weeks.

## 2020-10-07 DIAGNOSIS — H401131 Primary open-angle glaucoma, bilateral, mild stage: Secondary | ICD-10-CM | POA: Diagnosis not present

## 2020-10-07 DIAGNOSIS — E119 Type 2 diabetes mellitus without complications: Secondary | ICD-10-CM | POA: Diagnosis not present

## 2020-10-07 DIAGNOSIS — H524 Presbyopia: Secondary | ICD-10-CM | POA: Diagnosis not present

## 2020-10-07 DIAGNOSIS — Z961 Presence of intraocular lens: Secondary | ICD-10-CM | POA: Diagnosis not present

## 2020-10-07 DIAGNOSIS — H52203 Unspecified astigmatism, bilateral: Secondary | ICD-10-CM | POA: Diagnosis not present

## 2020-10-10 ENCOUNTER — Other Ambulatory Visit: Payer: Self-pay | Admitting: Cardiology

## 2020-10-17 DIAGNOSIS — Z20822 Contact with and (suspected) exposure to covid-19: Secondary | ICD-10-CM | POA: Diagnosis not present

## 2020-10-29 ENCOUNTER — Telehealth: Payer: Self-pay | Admitting: Medical

## 2020-10-29 ENCOUNTER — Telehealth (INDEPENDENT_AMBULATORY_CARE_PROVIDER_SITE_OTHER): Payer: Medicare Other | Admitting: Medical

## 2020-10-29 ENCOUNTER — Other Ambulatory Visit: Payer: Self-pay

## 2020-10-29 VITALS — HR 106

## 2020-10-29 DIAGNOSIS — U071 COVID-19: Secondary | ICD-10-CM | POA: Diagnosis not present

## 2020-10-29 DIAGNOSIS — M25559 Pain in unspecified hip: Secondary | ICD-10-CM | POA: Diagnosis not present

## 2020-10-29 DIAGNOSIS — R059 Cough, unspecified: Secondary | ICD-10-CM | POA: Diagnosis not present

## 2020-10-29 MED ORDER — NIRMATRELVIR/RITONAVIR (PAXLOVID) TABLET (RENAL DOSING): 2.0000 | ORAL_TABLET | Freq: Two times a day (BID) | ORAL | 0 refills | Status: AC

## 2020-10-29 MED ORDER — BENZONATATE 100 MG PO CAPS: 100.0000 mg | ORAL_CAPSULE | Freq: Three times a day (TID) | ORAL | 0 refills | Status: DC | PRN

## 2020-10-29 MED ORDER — TRAMADOL HCL 50 MG PO TABS: 50.0000 mg | ORAL_TABLET | Freq: Four times a day (QID) | ORAL | 0 refills | Status: AC | PRN

## 2020-10-29 MED ORDER — CLONAZEPAM 0.5 MG PO TABS: ORAL_TABLET | ORAL | 0 refills | Status: DC

## 2020-10-29 NOTE — Patient Instructions (Addendum)
Recent COVID infection with and 3 days of symptom onset.  Tested positive on Sunday.  History of 3 vaccines in the past.  Describes moderate illness and has COVID risk score of 6.  Can use vitamin D over-the-counter and zinc.  Prescribed benzonatate for cough and decided to go ahead and send in a azithromycin antibiotic.  Continue check O2 sat percentage daily and if O2 sats less than 96% let us know.  Did send prescription for paxlovid with instructions to renal dose.  Did also go ahead and put in monoclonal antibody infusions.  RN at the infusion center sent me a message stating that presently we only giving packed fluid or and may be but not both.  So far with just paxlovid alone seeing 0% admission rate.  For hip pain refilled patient's tramadol.  She might get some secondary decrease in cough with narcotic use.  Patient is not going to take her Norco.  Follow-up Monday or Tuesday by video visit or sooner if needed.  Explained to patient that if she has worsening signs symptoms despite the above measures then be seen in the emergency department.   Explained to patient to continue her current medications.  She had question on her diuretic use.  She has history of CHF.  Advised her to continue diuretics but to make sure she is staying well-hydrated.  Presently she indicates that she is lost appetite and not wanting to eat.    Talked with pharmacist at 5 pm. Kristopher Oppenheim pharmacist explained possible interaction with trazadone, buspar and warfarin with paxlovid. Explained stop trazadone and buspar. Will rx clonazepam for next 5 nights to help with insomnia and anxiety.  Saw in epic 10-05-2020 inr was 2.0 and dose adjustment med. So advised start paxlovid and advised pharmacist to let pt know will try to get INR soon maybe  have to waiit 5-7 days.   Our pharmacist Phil paxloivd mentioned may or may not effect. Onset is slow. Retronivir may decrease INR.   So will see if can find out  patient lab such as [facility] location that can draw INR. Or maybe consider home health nurse inr check.  Also see see coumadin clinic will draw inr on pt.

## 2020-10-29 NOTE — Telephone Encounter (Signed)
Will you follow up on the monoclonal antibody orders.

## 2020-10-29 NOTE — Addendum Note (Signed)
Addended by: Anabel Halon on: 10/29/2020 05:52 PM   Modules accepted: Orders

## 2020-10-29 NOTE — Telephone Encounter (Signed)
Will you see if we have any location where patient can have INR.  She has recent COVID infection and I prescribed paxlovid.  [place] indicated that INR might be increased.  Phil pharmacist downstairs thinks that there is more data indicating that antiviral will decrease INR.  Onset of action in the event probably slow.  Patient last INR was 2.0 on May 15 and dose adjustments were made at the Coumadin clinic.  Would Coumadin clinic be able to check patient's INR in light of her testing positive for COVID?  Can the lab at [facility] check her INR?  I am assuming patient cannot get it done in our lab.  Would you let me know what you find out.  Could INR clinic go to patient's car.  Could we take our portable INR machine and check her tomorrow if she drove to the med center?

## 2020-10-29 NOTE — Progress Notes (Signed)
Subjective:    Patient ID: Abigail Oliver, female    DOB: 1946/05/04, 75 y.o.   MRN: [id number]  HPI  Virtual Visit via Video Note  I connected with [name] on 10/29/20 at 10:40 AM EDT by a video enabled telemedicine application and verified that I am speaking with the correct person using two identifiers.  Location:  Patient: home Provider: office   I discussed the limitations of evaluation and management by telemedicine and the availability of in person appointments. The patient expressed understanding and agreed to proceed.  History of Present Illness:   Pt states she recently got covid after cruise. Saturday night had chills, fever, runny nose, bodyaches and fatigue. Sunday tested + for covid.  Pt got covid 3 vaccines. Pt is not sob or wheezing. She has dry cough.   Pt has not been eating much. State she is staying hydrated.  Pt has covid risk score of 6.     Observations/Objective: General-no acute distress, pleasant, oriented. Lungs- on inspection lungs appear unlabored. Neck- no tracheal deviation or jvd on inspection. Neuro- gross motor function appears intact.     Assessment and Plan: Recent COVID infection with and 3 days of symptom onset.  Tested positive on Sunday.  History of 3 vaccines in the past.  Describes moderate illness and has COVID risk score of 6.  Can use vitamin D over-the-counter and zinc.  Prescribed benzonatate for cough and decided to go ahead and send in a azithromycin antibiotic.  Continue check O2 sat percentage daily and if O2 sats less than 96% let us know.  Did send prescription for paxlovid with instructions to renal dose.  Did also go ahead and put in monoclonal antibody infusions.  RN at the infusion center sent me a message stating that presently we only giving packed fluid or and may be but not both.  So far with just paxlovid alone seeing 0% admission rate.  For hip pain refilled patient's tramadol.  She might  get some secondary decrease in cough with narcotic use.  Patient is not going to take her Norco.  Follow-up Monday or Tuesday by video visit or sooner if needed.  Explained to patient that if she has worsening signs symptoms despite the above measures then be seen in the emergency department.   Explained to patient to continue her current medications.  She had question on her diuretic use.  She has history of CHF.  Advised her to continue diuretics but to make sure she is staying well-hydrated.  Presently she indicates that she is lost appetite and not wanting to eat.  Time spent with patient today was 45  minutes which consisted of chart review, discussing diagnosis, work up, treatment and documentation.  Follow Up Instructions:    I discussed the assessment and treatment plan with the patient. The patient was provided an opportunity to ask questions and all were answered. The patient agreed with the plan and demonstrated an understanding of the instructions.   The patient was advised to call back or seek an in-person evaluation if the symptoms worsen or if the condition fails to improve as anticipated.  Time spent with patient today was 45 minutes which consisted of chart revdiew, discussing diagnosis, work up treatment and documentation.   [name], PA-C   Review of Systems  Constitutional: Positive for fatigue.  HENT: Positive for congestion.   Respiratory: Positive for cough. Negative for shortness of breath and wheezing.   Cardiovascular: Negative for chest  pain and palpitations.  Gastrointestinal: Negative for abdominal pain.  Musculoskeletal: Positive for myalgias.       Hip pains.  Skin: Negative for rash.  Neurological: Negative for dizziness and headaches.  Hematological: Negative for adenopathy. Does not bruise/bleed easily.  Psychiatric/Behavioral: Positive for sleep disturbance. Negative for behavioral problems and confusion.       Objective:   Physical  Exam        Assessment & Plan:

## 2020-10-30 ENCOUNTER — Telehealth: Payer: Self-pay | Admitting: Cardiovascular Disease

## 2020-10-30 NOTE — Telephone Encounter (Signed)
Called and lmom pt to schedule inr

## 2020-10-30 NOTE — Telephone Encounter (Signed)
Spoke with Enis Gash at [facility] and she will pass on message to see what they can do.  They will call us back.  Usually when patient has Covid of course they do not come in.

## 2020-10-30 NOTE — Telephone Encounter (Signed)
Patient spoke with coumadin clinic and they said that she could not come in until Monday to get INR checked.  Patient seemed a little upset because she does not know what to do.     Messaged Edward and he stated for patient to come in tomorrow and we will check INR via drive up and can continue Paxlovid as long as she will be checked tomorrow.  Patient will be here at 830am for INR check.

## 2020-10-30 NOTE — Telephone Encounter (Signed)
Sheketia with [facility] states the patient tester positive for COVID on 10/26/20. The patient is beginning Paxlovid for treatment and the provider with [facility] is recommending an INR check at the start and end of Paxlovid. They would like to know how soon the patient can be seen for in-clinic INR check. Please advise.  Phone #: [phone]

## 2020-10-30 NOTE — Telephone Encounter (Signed)
D/w pharmacist she states that pt has an appt on Monday and they will discuss it then. No need to call pt back per PharMD

## 2020-10-30 NOTE — Telephone Encounter (Signed)
    Pt is returning call, per notes offered to schedule coumadin on Monday, pt said per her pcp since she have covid, it made her blood thicker and may get a stroke she need to get her INR today or tomorrow. Called coumadin clinic spoke with [facility], she explained we cannot schedule until after 5 days after pt tested + for covid. Explained that to the pt and advised per Erasmo Downer the soonest we can get her in is on Monday. Pt said "this is ridiculous I will die of stroke" and hang up.

## 2020-10-30 NOTE — Telephone Encounter (Signed)
Will route to pharmd pool

## 2020-10-31 ENCOUNTER — Other Ambulatory Visit: Payer: Self-pay

## 2020-10-31 ENCOUNTER — Ambulatory Visit (INDEPENDENT_AMBULATORY_CARE_PROVIDER_SITE_OTHER): Payer: Medicare Other | Admitting: *Deleted

## 2020-10-31 DIAGNOSIS — Z7901 Long term (current) use of anticoagulants: Secondary | ICD-10-CM | POA: Diagnosis not present

## 2020-10-31 LAB — POCT INR: INR: 2.3 (ref 2.0–3.0)

## 2020-10-31 NOTE — Progress Notes (Addendum)
Pt here for INR check per Percell Miller Saguier   Goal INR = 2.5-3.5  Last INR = 2.0  Pt currently takes Coumadin 1.5 mg every Tue, Thu, Sat; 3 mg all other days  Pt denies recent antibiotics, no dietary changes and no unusual bruising / bleeding.  Patient is on Paxlovid.  Has taken dose for today.  INR today = 2.3  Pt advised per Percell Miller to continue same regimen and to call coumadin clinic to recheck on Wednesday.  [name], PA-C

## 2020-11-05 ENCOUNTER — Ambulatory Visit (INDEPENDENT_AMBULATORY_CARE_PROVIDER_SITE_OTHER): Payer: Medicare Other

## 2020-11-05 ENCOUNTER — Other Ambulatory Visit: Payer: Self-pay

## 2020-11-05 DIAGNOSIS — Z952 Presence of prosthetic heart valve: Secondary | ICD-10-CM | POA: Diagnosis not present

## 2020-11-05 DIAGNOSIS — Z7901 Long term (current) use of anticoagulants: Secondary | ICD-10-CM

## 2020-11-05 LAB — POCT INR: INR: 2.6 (ref 2.0–3.0)

## 2020-11-05 NOTE — Patient Instructions (Signed)
Continue with 1 tablet daily except 1/2 tablet each Tuesday, Thursday and Saturday.  Repeat INR in 6 weeks.

## 2020-11-06 ENCOUNTER — Telehealth: Payer: Self-pay | Admitting: Medical

## 2020-11-06 ENCOUNTER — Other Ambulatory Visit: Payer: Self-pay | Admitting: Cardiology

## 2020-11-06 NOTE — Telephone Encounter (Signed)
Pt, called she would like a letter to return back to work, pt is going to take covid test today, pt,was told she has to provide a negative Covid testing before Percell Miller can write a letter pt also want Percell Miller to know that her Coumadin test was ok   Please advice

## 2020-11-07 NOTE — Telephone Encounter (Signed)
Pt states the home test for covid test was negative .

## 2020-11-07 NOTE — Telephone Encounter (Signed)
Letter sent via my chart

## 2020-11-09 ENCOUNTER — Other Ambulatory Visit (HOSPITAL_COMMUNITY): Payer: Self-pay | Admitting: Internal Medicine

## 2020-11-12 ENCOUNTER — Other Ambulatory Visit (HOSPITAL_COMMUNITY): Payer: Self-pay | Admitting: Cardiology

## 2020-11-12 ENCOUNTER — Telehealth (HOSPITAL_COMMUNITY): Payer: Self-pay | Admitting: Cardiology

## 2020-11-13 ENCOUNTER — Other Ambulatory Visit: Payer: Self-pay | Admitting: Medical

## 2020-12-03 ENCOUNTER — Other Ambulatory Visit: Payer: Self-pay | Admitting: Medical

## 2020-12-17 ENCOUNTER — Ambulatory Visit (INDEPENDENT_AMBULATORY_CARE_PROVIDER_SITE_OTHER): Payer: Medicare Other

## 2020-12-17 ENCOUNTER — Other Ambulatory Visit: Payer: Self-pay

## 2020-12-17 DIAGNOSIS — Z952 Presence of prosthetic heart valve: Secondary | ICD-10-CM

## 2020-12-17 DIAGNOSIS — Z7901 Long term (current) use of anticoagulants: Secondary | ICD-10-CM

## 2020-12-17 LAB — POCT INR: INR: 2.4 (ref 2.0–3.0)

## 2020-12-17 NOTE — Patient Instructions (Signed)
Take 2 tablets today only and then Continue with 1 tablet daily except 1/2 tablet each Tuesday, Thursday and Saturday.  Repeat INR in 3 weeks.

## 2020-12-30 ENCOUNTER — Other Ambulatory Visit: Payer: Self-pay | Admitting: Medical

## 2021-01-06 ENCOUNTER — Other Ambulatory Visit: Payer: Self-pay | Admitting: Medical

## 2021-01-07 ENCOUNTER — Ambulatory Visit (INDEPENDENT_AMBULATORY_CARE_PROVIDER_SITE_OTHER): Payer: Medicare Other

## 2021-01-07 ENCOUNTER — Other Ambulatory Visit: Payer: Self-pay

## 2021-01-07 DIAGNOSIS — Z7901 Long term (current) use of anticoagulants: Secondary | ICD-10-CM

## 2021-01-07 DIAGNOSIS — Z952 Presence of prosthetic heart valve: Secondary | ICD-10-CM

## 2021-01-07 LAB — POCT INR: INR: 3 (ref 2.0–3.0)

## 2021-01-07 NOTE — Patient Instructions (Signed)
Continue with 1 tablet daily except 1/2 tablet each Tuesday, Thursday and Saturday.  Repeat INR in 6 weeks.

## 2021-01-08 ENCOUNTER — Other Ambulatory Visit: Payer: Self-pay | Admitting: Medical

## 2021-01-16 ENCOUNTER — Encounter (HOSPITAL_COMMUNITY): Payer: Medicare Other | Admitting: Cardiology

## 2021-01-30 ENCOUNTER — Ambulatory Visit (INDEPENDENT_AMBULATORY_CARE_PROVIDER_SITE_OTHER): Payer: Medicare Other | Admitting: Cardiovascular Disease

## 2021-01-30 ENCOUNTER — Encounter: Payer: Self-pay | Admitting: Cardiovascular Disease

## 2021-01-30 ENCOUNTER — Other Ambulatory Visit: Payer: Self-pay

## 2021-01-30 VITALS — BP 138/52 | HR 52 | Ht 62.0 in | Wt 169.4 lb

## 2021-01-30 DIAGNOSIS — Z79899 Other long term (current) drug therapy: Secondary | ICD-10-CM | POA: Diagnosis not present

## 2021-01-30 DIAGNOSIS — Z952 Presence of prosthetic heart valve: Secondary | ICD-10-CM | POA: Diagnosis not present

## 2021-01-30 DIAGNOSIS — I5189 Other ill-defined heart diseases: Secondary | ICD-10-CM | POA: Diagnosis not present

## 2021-01-30 DIAGNOSIS — I739 Peripheral vascular disease, unspecified: Secondary | ICD-10-CM

## 2021-01-30 DIAGNOSIS — E785 Hyperlipidemia, unspecified: Secondary | ICD-10-CM | POA: Diagnosis not present

## 2021-01-30 LAB — BASIC METABOLIC PANEL
BUN/Creatinine Ratio: 17 (ref 12–28)
BUN: 24 mg/dL (ref 8–27)
CO2: 25 mmol/L (ref 20–29)
Calcium: 10 mg/dL (ref 8.7–10.3)
Chloride: 99 mmol/L (ref 96–106)
Creatinine, Ser: 1.45 mg/dL — ABNORMAL HIGH (ref 0.57–1.00)
Glucose: 160 mg/dL — ABNORMAL HIGH (ref 65–99)
Potassium: 4.6 mmol/L (ref 3.5–5.2)
Sodium: 139 mmol/L (ref 134–144)
eGFR: 38 mL/min/{1.73_m2} — ABNORMAL LOW (ref >59)

## 2021-01-30 LAB — CBC
Hematocrit: 33.7 % — ABNORMAL LOW (ref 34.0–46.6)
Hemoglobin: 11 g/dL — ABNORMAL LOW (ref 11.1–15.9)
MCH: 27.2 pg (ref 26.6–33.0)
MCHC: 32.6 g/dL (ref 31.5–35.7)
MCV: 83 fL (ref 79–97)
Platelets: 146 10*3/uL — ABNORMAL LOW (ref 150–450)
RBC: 4.05 x10E6/uL (ref 3.77–5.28)
RDW: 14.9 % (ref 11.7–15.4)
WBC: 5.8 10*3/uL (ref 3.4–10.8)

## 2021-01-30 LAB — HEPATIC FUNCTION PANEL
ALT: 10 IU/L (ref 0–32)
AST: 20 IU/L (ref 0–40)
Albumin: 4.8 g/dL — ABNORMAL HIGH (ref 3.7–4.7)
Alkaline Phosphatase: 88 IU/L (ref 44–121)
Bilirubin Total: 0.9 mg/dL (ref 0.0–1.2)
Bilirubin, Direct: 0.27 mg/dL (ref 0.00–0.40)
Total Protein: 7 g/dL (ref 6.0–8.5)

## 2021-01-30 LAB — LIPID PANEL
Chol/HDL Ratio: 3.3 ratio (ref 0.0–4.4)
Cholesterol, Total: 144 mg/dL (ref 100–199)
HDL: 43 mg/dL (ref >39)
LDL Chol Calc (NIH): 66 mg/dL (ref 0–99)
Triglycerides: 217 mg/dL — ABNORMAL HIGH (ref 0–149)
VLDL Cholesterol Cal: 35 mg/dL (ref 5–40)

## 2021-01-30 LAB — TSH: TSH: 3.67 u[IU]/mL (ref 0.450–4.500)

## 2021-01-30 NOTE — Assessment & Plan Note (Signed)
History of diastolic dysfunction with normal LV systolic function on torsemide.  She appears euvolemic and his note for peripheral edema.

## 2021-01-30 NOTE — Assessment & Plan Note (Signed)
History of CAD status post coronary artery bypass grafting in 2000 with a RIMA to her RCA.  She did have a right left heart cath by Dr. [name] 7/15.  He demonstrated an 80% stenosis in the proximal LAD that was hemodynamically significant by FFR and she underwent PCI drug-eluting stenting.  She denies chest pain or shortness of breath.

## 2021-01-30 NOTE — Assessment & Plan Note (Signed)
History of Saint Jude aortic valve replacement in June 2000.  Her last 2D echo performed 12/20/2016 revealed normal LV systolic function with grade 1 diastolic function Julli and a normally functioning aortic mechanical prosthesis.  We will recheck a 2D echocardiogram.

## 2021-01-30 NOTE — Assessment & Plan Note (Signed)
History of PAD status post right SFA stenting by myself 09/20/2011.  Her last Doppler studies were performed a year ago revealing the stent to be widely patent.  Her biggest complaint is right hip and left knee discomfort.  She is scheduled for lower extremity arterial Doppler studies next week.

## 2021-01-30 NOTE — Progress Notes (Signed)
01/30/2021 Abigail Oliver   1945-06-01  [id number]  Primary Physician Saguier, Iris Pert Primary Cardiologist: Lorretta Harp MD Lupe Carney, [place]  HPI:  Abigail Oliver is a 75 y.o.  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  She has also seen Dr. [name] for diastolic heart failure.  She was referred to me through the courtesy of Dr. Ellouise Newer for peripheral vascular evaluation because of lifestyle limiting claudication in her right leg.  I last saw her at the time of intervention 09/20/2011.  Her other problems include hypertension, hyperlipidemia and diabetes. She has had coronary bypass grafting in 2000 as well as aortic valve replacement with a St. Jude aortic valve. Her Myoview performed within the last several weeks with nonischemic.  Lower extremity arterial Doppler studies performed prior to her intervention revealed a right ABI of 0.81 with a high-frequency signal in the mid right SFA the left ABI 1.1.    I performed PTA and nitinol self-expanding stenting of an 80% mid right SFA stenosis on 09/20/2011.  She did well after that although I did not see her back in the office.  She has been followed by Dr. [name] for diastolic heart failure.  She has complained of some back hip and knee pain as well as symptoms compatible with diabetic peripheral neuropathy although in talking to her today it does not sound as though she has claudication symptoms.  Doppler studies performed 01/23/2019 revealed normal ABIs bilaterally with a moderately elevated signal in the proximal right popliteal artery.  I suggested that we continue to follow that noninvasively and clinically.  Since I saw her 2 years ago she continues to do well.  She denies chest pain or shortness of breath.  She does get some right hip and left knee discomfort but these do not sound like claudication.  Her last lower  extremity arterial Doppler studies performed a year ago does showed mild in-stent restenosis within the right SFA stent.  She is scheduled to have Dopplers next week.     Current Meds  Medication Sig   alendronate (FOSAMAX) 10 MG tablet Take 1 tablet (10 mg total) by mouth daily before breakfast. Take with a full glass of water on an empty stomach.   ascorbic acid (VITAMIN C) 500 MG tablet Take 500 mg by mouth as needed.    aspirin EC 81 MG tablet Take 1 tablet (81 mg total) by mouth daily.   benzonatate (TESSALON) 100 MG capsule Take 1 capsule (100 mg total) by mouth 3 (three) times daily as needed for cough.   busPIRone (BUSPAR) 15 MG tablet TAKE ONE TABLET BY MOUTH THREE TIMES A DAY   calcium-vitamin D (OSCAL WITH D) 500-200 MG-UNIT tablet Take 1 tablet by mouth 2 (two) times daily.    clonazePAM (KLONOPIN) 0.5 MG tablet 1 tab po q hs prn anxiety or insomnia   COVID-19 mRNA vaccine, Pfizer, 30 MCG/0.3ML injection INJECT AS DIRECTED   dicyclomine (BENTYL) 10 MG capsule Take 1 capsule (10 mg total) by mouth 3 (three) times daily before meals.   FARXIGA 10 MG TABS tablet Take 10 mg by mouth daily.   ferrous sulfate 325 (65 FE) MG tablet Take 1 tablet (325 mg total) by mouth daily.   folic acid (FOLVITE) 1 MG tablet Take 1 mg by mouth daily.   latanoprost (XALATAN) 0.005 % ophthalmic solution PLACE 1 DROP INTO  BOTH EYES AT BEDTIME.   metFORMIN (GLUCOPHAGE) 1000 MG tablet Take 1,000 mg by mouth daily with breakfast.    metoprolol succinate (TOPROL-XL) 50 MG 24 hr tablet TAKE ONE TABLET BY MOUTH DAILY   pantoprazole (PROTONIX) 40 MG tablet TAKE ONE TABLET BY MOUTH DAILY   potassium chloride SA (K-DUR) 20 MEQ tablet Take 20 mEq by mouth 3 (three) times daily.   rosuvastatin (CRESTOR) 20 MG tablet Take 1 tablet (20 mg total) by mouth daily.   sertraline (ZOLOFT) 25 MG tablet TAKE ONE TABLET BY MOUTH DAILY   torsemide (DEMADEX) 20 MG tablet Take 2 tablets (40 mg total) by mouth daily.   traZODone  (DESYREL) 50 MG tablet TAKE 1/2 TO 1 TABLET (25-50 MG TOTAL) BY MOUTH AT BEDTIME AS NEEDED FOR SLEEP.   warfarin (COUMADIN) 3 MG tablet TAKE 1/2 TO 1 TABLET BY MOUTH DAILY AS DIRECTED     Allergies  Allergen Reactions   Atorvastatin     Muscle pain   Simvastatin Other (See Comments)    Muscle pain   Sulfa Antibiotics     unknown   Iodinated Diagnostic Agents Rash     Red rash after cardiac cath 1 wk ago, ? Contrast allergy, requires 13 hr prep now per [email or web address]  Red rash after cardiac cath 1 wk ago, ? Contrast allergy, requires 13 hr prep now per [email or web address]    Social History   Socioeconomic History   Marital status: Married    Spouse name: [name]   Number of children: 2   Years of education: 14   Highest education level: Not on file  Occupational History    Comment: hair dresser  Tobacco Use   Smoking status: Never   Smokeless tobacco: Never  Substance and Sexual Activity   Alcohol use: No   Drug use: No   Sexual activity: Never  Other Topics Concern   Not on file  Social History Narrative   Pt is a high school graduate with 2 years of college. Married in 1967 she has 1 son born 49 and 1 daughter born 63 and 1 grandchild. 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      Caffeine use- 2-3 /day, soda, tea       Lives with husband one story home   Social Determinants of Health   Financial Resource Strain: Not on file  Food Insecurity: Not on file  Transportation Needs: Not on file  Physical Activity: Not on file  Stress: Not on file  Social Connections: Not on file  Intimate Partner Violence: Not on file     Review of Systems: General: negative for chills, fever, night sweats or weight changes.  Cardiovascular: negative for chest pain, dyspnea on exertion, edema, orthopnea, palpitations, paroxysmal nocturnal dyspnea or shortness of breath Dermatological: negative for rash Respiratory: negative for cough or  wheezing Urologic: negative for hematuria Abdominal: negative for nausea, vomiting, diarrhea, bright red blood per rectum, melena, or hematemesis Neurologic: negative for visual changes, syncope, or dizziness All other systems reviewed and are otherwise negative except as noted above.    Blood pressure (!) 138/52, pulse (!) 52, height '5\' 2"'$  (1.575 m), weight 169 lb 6.4 oz (76.8 kg), SpO2 98 %.  General appearance: alert and no distress Neck: no adenopathy, no carotid bruit, no JVD, supple, symmetrical, trachea midline, and thyroid not enlarged, symmetric, no tenderness/mass/nodules Lungs: clear to auscultation bilaterally Heart: Crisp prosthetic mechanical aortic valve sounds. Extremities: extremities normal, atraumatic,  no cyanosis or edema Pulses: 2+ and symmetric Skin: Skin color, texture, turgor normal. No rashes or lesions Neurologic: Grossly normal  EKG sinus bradycardia 52 without ST or T wave changes.  I personally reviewed this EKG.  ASSESSMENT AND PLAN:   Dyslipidemia History of dyslipidemia on statin therapy with lipid profile performed 03/25/2020 revealing total cholesterol 139, LDL 62 and HDL of 48.  We will recheck a fasting lipid liver profile this morning.  CAD, CABG 2000, low risk Myoview April 2011; 2015 + myoview History of CAD status post coronary artery bypass grafting in 2000 with a RIMA to her RCA.  She did have a right left heart cath by Dr. [name] 7/15.  He demonstrated an 80% stenosis in the proximal LAD that was hemodynamically significant by FFR and she underwent PCI drug-eluting stenting.  She denies chest pain or shortness of breath.  Diastolic dysfunction with NL LVF 2D August 2012 History of diastolic dysfunction with normal LV systolic function on torsemide.  She appears euvolemic and his note for peripheral edema.  Claudication [facility]) History of PAD status post right SFA stenting by myself 09/20/2011.  Her last Doppler studies were performed a year ago  revealing the stent to be widely patent.  Her biggest complaint is right hip and left knee discomfort.  She is scheduled for lower extremity arterial Doppler studies next week.  H/O aortic valve replacement History of Saint Jude aortic valve replacement in June 2000.  Her last 2D echo performed 12/20/2016 revealed normal LV systolic function with grade 1 diastolic function Julli and a normally functioning aortic mechanical prosthesis.  We will recheck a 2D echocardiogram.     Lorretta Harp MD [facility], [facility] 01/30/2021 8:40 AM

## 2021-01-30 NOTE — Assessment & Plan Note (Signed)
History of dyslipidemia on statin therapy with lipid profile performed 03/25/2020 revealing total cholesterol 139, LDL 62 and HDL of 48.  We will recheck a fasting lipid liver profile this morning.

## 2021-01-30 NOTE — Patient Instructions (Signed)
Medication Instructions:  Your Physician recommend you continue on your current medication as directed.    *If you need a refill on your cardiac medications before your next appointment, please call your pharmacy*   Lab Work: Your physician recommends lab work today (Lipid, LFT, BMP, TSH, CBC)  If you have labs (blood work) drawn today and your tests are completely normal, you will receive your results only by: [place] (if you have MyChart) OR A paper copy in the mail If you have any lab test that is abnormal or we need to change your treatment, we will call you to review the results.   Testing/Procedures: Your physician has requested that you have an echocardiogram. Echocardiography is a painless test that uses sound waves to create images of your heart. It provides your doctor with information about the size and shape of your heart and how well your heart's chambers and valves are working. This procedure takes approximately one hour. There are no restrictions for this procedure. [place] [address]    Follow-Up: At [facility], you and your health needs are our priority.  As part of our continuing mission to provide you with exceptional heart care, we have created designated Provider Care Teams.  These Care Teams include your primary Cardiologist (physician) and Advanced Practice Providers (APPs -  Physician Assistants and Nurse Practitioners) who all work together to provide you with the care you need, when you need it.  We recommend signing up for the patient portal called "MyChart".  Sign up information is provided on this After Visit Summary.  MyChart is used to connect with patients for Virtual Visits (Telemedicine).  Patients are able to view lab/test results, encounter notes, upcoming appointments, etc.  Non-urgent messages can be sent to your provider as well.   To learn more about what you can do with MyChart, go to [email or web address].    Your next  appointment:   1 year(s)  The format for your next appointment:   In Person  Provider:   Quay Burow, MD

## 2021-02-03 ENCOUNTER — Ambulatory Visit (HOSPITAL_COMMUNITY)
Admission: RE | Admit: 2021-02-03 | Discharge: 2021-02-03 | Disposition: A | Discharge status: Home / Self Care | Payer: Medicare Other | Source: Ambulatory Visit | Attending: Internal Medicine | Admitting: Internal Medicine

## 2021-02-03 ENCOUNTER — Other Ambulatory Visit (HOSPITAL_COMMUNITY): Payer: Self-pay | Admitting: Cardiovascular Disease

## 2021-02-03 ENCOUNTER — Other Ambulatory Visit: Payer: Self-pay

## 2021-02-03 DIAGNOSIS — I739 Peripheral vascular disease, unspecified: Secondary | ICD-10-CM

## 2021-02-03 DIAGNOSIS — Z95820 Peripheral vascular angioplasty status with implants and grafts: Secondary | ICD-10-CM

## 2021-02-11 DIAGNOSIS — H401131 Primary open-angle glaucoma, bilateral, mild stage: Secondary | ICD-10-CM | POA: Diagnosis not present

## 2021-02-17 ENCOUNTER — Ambulatory Visit (HOSPITAL_COMMUNITY): Payer: Medicare Other | Attending: Internal Medicine

## 2021-02-17 ENCOUNTER — Other Ambulatory Visit: Payer: Self-pay

## 2021-02-17 DIAGNOSIS — Z952 Presence of prosthetic heart valve: Secondary | ICD-10-CM

## 2021-02-17 LAB — ECHOCARDIOGRAM COMPLETE
AR max vel: 1.11 cm2
AV Area VTI: 1.3 cm2
AV Area mean vel: 1.22 cm2
AV Mean grad: 14 mmHg
AV Peak grad: 25.4 mmHg
Ao pk vel: 2.52 m/s
Area-P 1/2: 2.94 cm2
MV M vel: 5.19 m/s
MV Peak grad: 107.7 mmHg
Radius: 0.3 cm
S' Lateral: 2.4 cm

## 2021-02-18 ENCOUNTER — Ambulatory Visit (INDEPENDENT_AMBULATORY_CARE_PROVIDER_SITE_OTHER): Payer: Medicare Other

## 2021-02-18 DIAGNOSIS — Z952 Presence of prosthetic heart valve: Secondary | ICD-10-CM

## 2021-02-18 DIAGNOSIS — Z7901 Long term (current) use of anticoagulants: Secondary | ICD-10-CM

## 2021-02-18 DIAGNOSIS — Z5181 Encounter for therapeutic drug level monitoring: Secondary | ICD-10-CM

## 2021-02-18 LAB — POCT INR: INR: 2.4 (ref 2.0–3.0)

## 2021-02-18 NOTE — Patient Instructions (Signed)
Take 1.5 tablets tonight only and then Continue with 1 tablet daily except 1/2 tablet each Tuesday, Thursday and Saturday.  Repeat INR in 4 weeks.

## 2021-02-22 ENCOUNTER — Other Ambulatory Visit: Payer: Self-pay | Admitting: Medical

## 2021-03-09 ENCOUNTER — Other Ambulatory Visit: Payer: Self-pay | Admitting: Medical

## 2021-03-19 ENCOUNTER — Ambulatory Visit (INDEPENDENT_AMBULATORY_CARE_PROVIDER_SITE_OTHER): Payer: Medicare Other

## 2021-03-19 ENCOUNTER — Other Ambulatory Visit: Payer: Self-pay

## 2021-03-19 DIAGNOSIS — Z7901 Long term (current) use of anticoagulants: Secondary | ICD-10-CM

## 2021-03-19 DIAGNOSIS — Z5181 Encounter for therapeutic drug level monitoring: Secondary | ICD-10-CM | POA: Diagnosis not present

## 2021-03-19 DIAGNOSIS — Z952 Presence of prosthetic heart valve: Secondary | ICD-10-CM | POA: Diagnosis not present

## 2021-03-19 LAB — POCT INR: INR: 2.6 (ref 2.0–3.0)

## 2021-03-19 NOTE — Patient Instructions (Signed)
Continue with 1 tablet daily except 1/2 tablet each Tuesday, Thursday and Saturday.  Repeat INR in 6 weeks.

## 2021-03-23 ENCOUNTER — Telehealth: Payer: Self-pay | Admitting: Medical

## 2021-03-23 NOTE — Telephone Encounter (Signed)
Left message for patient to call back and schedule Medicare Annual Wellness Visit (AWV) in office.  ° °If not able to come in office, please offer to do virtually or by telephone.  Left office number and my jabber #[phone]. ° °Due for AWVI ° °Please schedule at anytime with Nurse Health Advisor. °  °

## 2021-03-27 DIAGNOSIS — M1712 Unilateral primary osteoarthritis, left knee: Secondary | ICD-10-CM | POA: Diagnosis not present

## 2021-03-27 DIAGNOSIS — M25561 Pain in right knee: Secondary | ICD-10-CM | POA: Diagnosis not present

## 2021-03-27 DIAGNOSIS — M25551 Pain in right hip: Secondary | ICD-10-CM | POA: Diagnosis not present

## 2021-03-30 ENCOUNTER — Other Ambulatory Visit (HOSPITAL_COMMUNITY): Payer: Self-pay | Admitting: Cardiology

## 2021-04-08 ENCOUNTER — Other Ambulatory Visit: Payer: Self-pay | Admitting: Medical

## 2021-04-26 ENCOUNTER — Other Ambulatory Visit (HOSPITAL_COMMUNITY): Payer: Self-pay | Admitting: Cardiology

## 2021-04-29 DIAGNOSIS — M1711 Unilateral primary osteoarthritis, right knee: Secondary | ICD-10-CM | POA: Diagnosis not present

## 2021-04-29 DIAGNOSIS — Z20822 Contact with and (suspected) exposure to covid-19: Secondary | ICD-10-CM | POA: Diagnosis not present

## 2021-04-29 DIAGNOSIS — M25562 Pain in left knee: Secondary | ICD-10-CM | POA: Diagnosis not present

## 2021-04-30 ENCOUNTER — Other Ambulatory Visit: Payer: Self-pay

## 2021-04-30 ENCOUNTER — Ambulatory Visit (INDEPENDENT_AMBULATORY_CARE_PROVIDER_SITE_OTHER): Payer: Medicare Other

## 2021-04-30 DIAGNOSIS — Z5181 Encounter for therapeutic drug level monitoring: Secondary | ICD-10-CM

## 2021-04-30 DIAGNOSIS — Z7901 Long term (current) use of anticoagulants: Secondary | ICD-10-CM | POA: Diagnosis not present

## 2021-04-30 DIAGNOSIS — Z952 Presence of prosthetic heart valve: Secondary | ICD-10-CM

## 2021-04-30 LAB — POCT INR: INR: 3.2 — AB (ref 2.0–3.0)

## 2021-04-30 NOTE — Patient Instructions (Signed)
Continue with 1 tablet daily except 1/2 tablet each Tuesday, Thursday and Saturday.  Repeat INR in 6 weeks. [phone]

## 2021-05-04 ENCOUNTER — Other Ambulatory Visit: Payer: Self-pay

## 2021-05-04 MED ORDER — POTASSIUM CHLORIDE CRYS ER 20 MEQ PO TBCR: 20.0000 meq | EXTENDED_RELEASE_TABLET | Freq: Three times a day (TID) | ORAL | 3 refills | Status: DC

## 2021-05-05 ENCOUNTER — Other Ambulatory Visit: Payer: Self-pay | Admitting: Medical

## 2021-05-07 DIAGNOSIS — S83282D Other tear of lateral meniscus, current injury, left knee, subsequent encounter: Secondary | ICD-10-CM | POA: Diagnosis not present

## 2021-05-07 DIAGNOSIS — M1711 Unilateral primary osteoarthritis, right knee: Secondary | ICD-10-CM | POA: Diagnosis not present

## 2021-05-08 ENCOUNTER — Telehealth: Payer: Self-pay | Admitting: Pharmacist

## 2021-05-08 NOTE — Telephone Encounter (Signed)
Patient called and reported she is having "knee surgery" on 12/20 and surgeon sent over clearance.  Do not see we have received anything.  Patient has needed Lovenox bridges in the past.  Can you look into this?

## 2021-05-08 NOTE — Telephone Encounter (Signed)
Who is the surgeon's office. I have not seen anything with the pt's name on it.

## 2021-05-08 NOTE — Telephone Encounter (Signed)
Patient with diagnosis of AVR on warfarin for anticoagulation.    Procedure:  LEFT KNEE ARTHROSCOPY Date of procedure: 05/12/21  CrCl 41 ml/min Platelet count 146K  Per office protocol, patient can hold warfarin for 5 days prior to procedure.    Patient WILL NOT need bridging with Lovenox (enoxaparin) around procedure.

## 2021-05-08 NOTE — Telephone Encounter (Signed)
I have reviewed the pt's chart and it seems like the pt's upcoming surgery is with Dr. Maureen Ralphs. I called Dr. Anne Fu office. I was able to confirm procedure. See notes below.    Pre-operative Risk Assessment    Patient Name: Abigail Oliver  DOB: March 30, 1946 MRN: [id number]      Request for Surgical Clearance    Procedure:   LEFT KNEE ARTHROSCOPY  Date of Surgery:  Clearance 05/12/21                                 Surgeon:  DR. Gaynelle Arabian Surgeon's Group or Practice Name:  [facility] Phone number:  [phone] Fax number:  [phone]   Type of Clearance Requested:   - Medical  - Pharmacy:  Hold Warfarin (Coumadin) [facility].    Type of Anesthesia:   CHOICE   Additional requests/questions:    Abigail Oliver   05/08/2021, 12:35 PM

## 2021-05-08 NOTE — Telephone Encounter (Signed)
° °  Name: Abigail Oliver  DOB: November 20, 1945  MRN: [id number]   Primary Cardiologist: Quay Burow, MD  Chart reviewed as part of pre-operative protocol coverage. Patient was contacted 05/08/2021 in reference to pre-operative risk assessment for pending surgery as outlined below.  Abigail Oliver was last seen on 01/30/2021 by Dr. Gwenlyn Found.  Since that day, Abigail Oliver has done well without exertional chest pain or worsening dyspnea.  Therefore, based on ACC/AHA guidelines, the patient would be at acceptable risk for the planned procedure without further cardiovascular testing.   See recommendation by our clinical pharmacist  The patient was advised that if she develops new symptoms prior to surgery to contact our office to arrange for a follow-up visit, and she verbalized understanding.  I will route this recommendation to the requesting party via Epic fax function and remove from pre-op pool. Please call with questions.  [place], [place] 05/08/2021, 5:43 PM

## 2021-05-11 ENCOUNTER — Other Ambulatory Visit: Payer: Self-pay | Admitting: Medical

## 2021-05-11 DIAGNOSIS — Z7984 Long term (current) use of oral hypoglycemic drugs: Secondary | ICD-10-CM | POA: Diagnosis not present

## 2021-05-11 DIAGNOSIS — N1832 Chronic kidney disease, stage 3b: Secondary | ICD-10-CM | POA: Diagnosis not present

## 2021-05-11 DIAGNOSIS — R110 Nausea: Secondary | ICD-10-CM | POA: Diagnosis not present

## 2021-05-11 DIAGNOSIS — E1140 Type 2 diabetes mellitus with diabetic neuropathy, unspecified: Secondary | ICD-10-CM | POA: Diagnosis not present

## 2021-05-12 DIAGNOSIS — M94262 Chondromalacia, left knee: Secondary | ICD-10-CM | POA: Diagnosis not present

## 2021-05-12 DIAGNOSIS — M23262 Derangement of other lateral meniscus due to old tear or injury, left knee: Secondary | ICD-10-CM | POA: Diagnosis not present

## 2021-05-12 DIAGNOSIS — G8918 Other acute postprocedural pain: Secondary | ICD-10-CM | POA: Diagnosis not present

## 2021-05-12 DIAGNOSIS — M23362 Other meniscus derangements, other lateral meniscus, left knee: Secondary | ICD-10-CM | POA: Diagnosis not present

## 2021-05-14 DIAGNOSIS — H401131 Primary open-angle glaucoma, bilateral, mild stage: Secondary | ICD-10-CM | POA: Diagnosis not present

## 2021-05-17 ENCOUNTER — Other Ambulatory Visit: Payer: Self-pay | Admitting: Medical

## 2021-05-25 ENCOUNTER — Other Ambulatory Visit (HOSPITAL_COMMUNITY): Payer: Self-pay | Admitting: Cardiology

## 2021-06-04 DIAGNOSIS — Z5189 Encounter for other specified aftercare: Secondary | ICD-10-CM | POA: Diagnosis not present

## 2021-06-11 ENCOUNTER — Ambulatory Visit (INDEPENDENT_AMBULATORY_CARE_PROVIDER_SITE_OTHER): Payer: Medicare Other

## 2021-06-11 ENCOUNTER — Other Ambulatory Visit: Payer: Self-pay

## 2021-06-11 DIAGNOSIS — Z7901 Long term (current) use of anticoagulants: Secondary | ICD-10-CM | POA: Diagnosis not present

## 2021-06-11 DIAGNOSIS — Z5181 Encounter for therapeutic drug level monitoring: Secondary | ICD-10-CM | POA: Diagnosis not present

## 2021-06-11 DIAGNOSIS — Z952 Presence of prosthetic heart valve: Secondary | ICD-10-CM

## 2021-06-11 LAB — POCT INR: INR: 2.2 (ref 2.0–3.0)

## 2021-06-11 NOTE — Patient Instructions (Signed)
TAKE 1 TABLET TODAY ONLY and then Continue with 1 tablet daily except 1/2 tablet each Tuesday, Thursday and Saturday.  Repeat INR in 6 weeks. [phone]

## 2021-06-25 ENCOUNTER — Other Ambulatory Visit (HOSPITAL_COMMUNITY): Payer: Self-pay | Admitting: Cardiology

## 2021-06-30 ENCOUNTER — Other Ambulatory Visit: Payer: Self-pay | Admitting: Medical

## 2021-07-03 ENCOUNTER — Other Ambulatory Visit: Payer: Self-pay | Admitting: Medical

## 2021-07-06 DIAGNOSIS — Z20822 Contact with and (suspected) exposure to covid-19: Secondary | ICD-10-CM | POA: Diagnosis not present

## 2021-07-21 ENCOUNTER — Other Ambulatory Visit (HOSPITAL_COMMUNITY): Payer: Self-pay | Admitting: Cardiology

## 2021-07-23 ENCOUNTER — Ambulatory Visit (INDEPENDENT_AMBULATORY_CARE_PROVIDER_SITE_OTHER): Payer: Medicare Other

## 2021-07-23 ENCOUNTER — Other Ambulatory Visit: Payer: Self-pay

## 2021-07-23 DIAGNOSIS — Z952 Presence of prosthetic heart valve: Secondary | ICD-10-CM | POA: Diagnosis not present

## 2021-07-23 DIAGNOSIS — Z5181 Encounter for therapeutic drug level monitoring: Secondary | ICD-10-CM | POA: Diagnosis not present

## 2021-07-23 DIAGNOSIS — Z7901 Long term (current) use of anticoagulants: Secondary | ICD-10-CM | POA: Diagnosis not present

## 2021-07-23 LAB — POCT INR: INR: 2.2 (ref 2.0–3.0)

## 2021-07-23 NOTE — Patient Instructions (Signed)
TAKE 1 TABLET TODAY ONLY and then Continue with 1 tablet daily except 1/2 tablet each Tuesday, Thursday and Saturday.  Repeat INR in 4 weeks. [phone] ?

## 2021-07-25 ENCOUNTER — Other Ambulatory Visit: Payer: Self-pay | Admitting: Medical

## 2021-07-25 ENCOUNTER — Other Ambulatory Visit (HOSPITAL_COMMUNITY): Payer: Self-pay | Admitting: Cardiology

## 2021-08-07 DIAGNOSIS — M25562 Pain in left knee: Secondary | ICD-10-CM | POA: Diagnosis not present

## 2021-08-13 DIAGNOSIS — H401131 Primary open-angle glaucoma, bilateral, mild stage: Secondary | ICD-10-CM | POA: Diagnosis not present

## 2021-08-20 ENCOUNTER — Ambulatory Visit (INDEPENDENT_AMBULATORY_CARE_PROVIDER_SITE_OTHER): Payer: Medicare Other

## 2021-08-20 ENCOUNTER — Other Ambulatory Visit (HOSPITAL_COMMUNITY): Payer: Self-pay | Admitting: Cardiology

## 2021-08-20 DIAGNOSIS — Z5181 Encounter for therapeutic drug level monitoring: Secondary | ICD-10-CM

## 2021-08-20 DIAGNOSIS — Z7901 Long term (current) use of anticoagulants: Secondary | ICD-10-CM | POA: Diagnosis not present

## 2021-08-20 DIAGNOSIS — Z952 Presence of prosthetic heart valve: Secondary | ICD-10-CM | POA: Diagnosis not present

## 2021-08-20 LAB — POCT INR: INR: 2.4 (ref 2.0–3.0)

## 2021-08-20 NOTE — Patient Instructions (Signed)
Increase to 1 tablet daily except 1/2 tablet each Tuesday and Thursday.  Repeat INR in 6 weeks. [phone] ?

## 2021-08-31 DIAGNOSIS — Z79891 Long term (current) use of opiate analgesic: Secondary | ICD-10-CM | POA: Diagnosis not present

## 2021-09-02 ENCOUNTER — Other Ambulatory Visit: Payer: Self-pay

## 2021-09-02 MED ORDER — POTASSIUM CHLORIDE CRYS ER 20 MEQ PO TBCR: 20.0000 meq | EXTENDED_RELEASE_TABLET | Freq: Three times a day (TID) | ORAL | 3 refills | Status: DC

## 2021-09-04 ENCOUNTER — Inpatient Hospital Stay (HOSPITAL_COMMUNITY): Payer: Medicare Other

## 2021-09-04 ENCOUNTER — Emergency Department (HOSPITAL_COMMUNITY): Payer: Medicare Other

## 2021-09-04 ENCOUNTER — Other Ambulatory Visit: Payer: Self-pay

## 2021-09-04 ENCOUNTER — Encounter (HOSPITAL_COMMUNITY): Payer: Self-pay | Admitting: Emergency Medicine

## 2021-09-04 ENCOUNTER — Inpatient Hospital Stay (HOSPITAL_COMMUNITY)
Admission: EM | Admit: 2021-09-04 | Discharge: 2021-09-12 | DRG: 480 | Disposition: A | Discharge status: Home Health | Payer: Medicare Other | Attending: Family Medicine | Admitting: Family Medicine

## 2021-09-04 DIAGNOSIS — I10 Essential (primary) hypertension: Secondary | ICD-10-CM | POA: Diagnosis not present

## 2021-09-04 DIAGNOSIS — I25110 Atherosclerotic heart disease of native coronary artery with unstable angina pectoris: Secondary | ICD-10-CM | POA: Diagnosis present

## 2021-09-04 DIAGNOSIS — E669 Obesity, unspecified: Secondary | ICD-10-CM | POA: Diagnosis not present

## 2021-09-04 DIAGNOSIS — E785 Hyperlipidemia, unspecified: Secondary | ICD-10-CM | POA: Diagnosis not present

## 2021-09-04 DIAGNOSIS — I4891 Unspecified atrial fibrillation: Secondary | ICD-10-CM | POA: Diagnosis present

## 2021-09-04 DIAGNOSIS — E1143 Type 2 diabetes mellitus with diabetic autonomic (poly)neuropathy: Secondary | ICD-10-CM | POA: Diagnosis present

## 2021-09-04 DIAGNOSIS — Z951 Presence of aortocoronary bypass graft: Secondary | ICD-10-CM

## 2021-09-04 DIAGNOSIS — S6992XA Unspecified injury of left wrist, hand and finger(s), initial encounter: Secondary | ICD-10-CM | POA: Diagnosis not present

## 2021-09-04 DIAGNOSIS — S72462A Displaced supracondylar fracture with intracondylar extension of lower end of left femur, initial encounter for closed fracture: Secondary | ICD-10-CM | POA: Diagnosis not present

## 2021-09-04 DIAGNOSIS — S7292XA Unspecified fracture of left femur, initial encounter for closed fracture: Secondary | ICD-10-CM

## 2021-09-04 DIAGNOSIS — S72422A Displaced fracture of lateral condyle of left femur, initial encounter for closed fracture: Secondary | ICD-10-CM

## 2021-09-04 DIAGNOSIS — N179 Acute kidney failure, unspecified: Secondary | ICD-10-CM

## 2021-09-04 DIAGNOSIS — S72432A Displaced fracture of medial condyle of left femur, initial encounter for closed fracture: Secondary | ICD-10-CM | POA: Diagnosis not present

## 2021-09-04 DIAGNOSIS — I130 Hypertensive heart and chronic kidney disease with heart failure and stage 1 through stage 4 chronic kidney disease, or unspecified chronic kidney disease: Secondary | ICD-10-CM | POA: Diagnosis present

## 2021-09-04 DIAGNOSIS — N1832 Chronic kidney disease, stage 3b: Secondary | ICD-10-CM | POA: Diagnosis present

## 2021-09-04 DIAGNOSIS — Z5181 Encounter for therapeutic drug level monitoring: Secondary | ICD-10-CM | POA: Diagnosis not present

## 2021-09-04 DIAGNOSIS — Z90710 Acquired absence of both cervix and uterus: Secondary | ICD-10-CM | POA: Diagnosis not present

## 2021-09-04 DIAGNOSIS — R410 Disorientation, unspecified: Secondary | ICD-10-CM

## 2021-09-04 DIAGNOSIS — E1142 Type 2 diabetes mellitus with diabetic polyneuropathy: Secondary | ICD-10-CM | POA: Diagnosis present

## 2021-09-04 DIAGNOSIS — I214 Non-ST elevation (NSTEMI) myocardial infarction: Secondary | ICD-10-CM | POA: Diagnosis not present

## 2021-09-04 DIAGNOSIS — D649 Anemia, unspecified: Secondary | ICD-10-CM

## 2021-09-04 DIAGNOSIS — W010XXA Fall on same level from slipping, tripping and stumbling without subsequent striking against object, initial encounter: Secondary | ICD-10-CM | POA: Diagnosis present

## 2021-09-04 DIAGNOSIS — D509 Iron deficiency anemia, unspecified: Secondary | ICD-10-CM | POA: Insufficient documentation

## 2021-09-04 DIAGNOSIS — E1122 Type 2 diabetes mellitus with diabetic chronic kidney disease: Secondary | ICD-10-CM | POA: Diagnosis not present

## 2021-09-04 DIAGNOSIS — S80919A Unspecified superficial injury of unspecified knee, initial encounter: Secondary | ICD-10-CM | POA: Diagnosis not present

## 2021-09-04 DIAGNOSIS — Y9301 Activity, walking, marching and hiking: Secondary | ICD-10-CM | POA: Diagnosis present

## 2021-09-04 DIAGNOSIS — E1151 Type 2 diabetes mellitus with diabetic peripheral angiopathy without gangrene: Secondary | ICD-10-CM | POA: Diagnosis present

## 2021-09-04 DIAGNOSIS — M25552 Pain in left hip: Secondary | ICD-10-CM | POA: Diagnosis not present

## 2021-09-04 DIAGNOSIS — Z7901 Long term (current) use of anticoagulants: Secondary | ICD-10-CM | POA: Diagnosis not present

## 2021-09-04 DIAGNOSIS — Z7982 Long term (current) use of aspirin: Secondary | ICD-10-CM | POA: Diagnosis not present

## 2021-09-04 DIAGNOSIS — S72452A Displaced supracondylar fracture without intracondylar extension of lower end of left femur, initial encounter for closed fracture: Principal | ICD-10-CM | POA: Diagnosis present

## 2021-09-04 DIAGNOSIS — Z952 Presence of prosthetic heart valve: Secondary | ICD-10-CM

## 2021-09-04 DIAGNOSIS — S72423A Displaced fracture of lateral condyle of unspecified femur, initial encounter for closed fracture: Secondary | ICD-10-CM | POA: Diagnosis present

## 2021-09-04 DIAGNOSIS — S72402A Unspecified fracture of lower end of left femur, initial encounter for closed fracture: Secondary | ICD-10-CM | POA: Diagnosis not present

## 2021-09-04 DIAGNOSIS — D62 Acute posthemorrhagic anemia: Secondary | ICD-10-CM | POA: Diagnosis not present

## 2021-09-04 DIAGNOSIS — H402234 Chronic angle-closure glaucoma, bilateral, indeterminate stage: Secondary | ICD-10-CM | POA: Diagnosis not present

## 2021-09-04 DIAGNOSIS — Y92000 Kitchen of unspecified non-institutional (private) residence as  the place of occurrence of the external cause: Secondary | ICD-10-CM | POA: Diagnosis not present

## 2021-09-04 DIAGNOSIS — F419 Anxiety disorder, unspecified: Secondary | ICD-10-CM

## 2021-09-04 DIAGNOSIS — Z79899 Other long term (current) drug therapy: Secondary | ICD-10-CM

## 2021-09-04 DIAGNOSIS — I21A1 Myocardial infarction type 2: Secondary | ICD-10-CM | POA: Diagnosis not present

## 2021-09-04 DIAGNOSIS — Z800 Family history of malignant neoplasm of digestive organs: Secondary | ICD-10-CM | POA: Diagnosis not present

## 2021-09-04 DIAGNOSIS — F32A Depression, unspecified: Secondary | ICD-10-CM | POA: Diagnosis present

## 2021-09-04 DIAGNOSIS — M47816 Spondylosis without myelopathy or radiculopathy, lumbar region: Secondary | ICD-10-CM | POA: Diagnosis not present

## 2021-09-04 DIAGNOSIS — I5032 Chronic diastolic (congestive) heart failure: Secondary | ICD-10-CM | POA: Diagnosis present

## 2021-09-04 DIAGNOSIS — I2510 Atherosclerotic heart disease of native coronary artery without angina pectoris: Secondary | ICD-10-CM | POA: Diagnosis not present

## 2021-09-04 DIAGNOSIS — S72352A Displaced comminuted fracture of shaft of left femur, initial encounter for closed fracture: Secondary | ICD-10-CM | POA: Diagnosis not present

## 2021-09-04 DIAGNOSIS — R9431 Abnormal electrocardiogram [ECG] [EKG]: Secondary | ICD-10-CM | POA: Diagnosis not present

## 2021-09-04 DIAGNOSIS — S0993XA Unspecified injury of face, initial encounter: Secondary | ICD-10-CM | POA: Diagnosis not present

## 2021-09-04 DIAGNOSIS — W19XXXA Unspecified fall, initial encounter: Principal | ICD-10-CM

## 2021-09-04 DIAGNOSIS — S199XXA Unspecified injury of neck, initial encounter: Secondary | ICD-10-CM | POA: Diagnosis not present

## 2021-09-04 DIAGNOSIS — R0902 Hypoxemia: Secondary | ICD-10-CM | POA: Diagnosis not present

## 2021-09-04 DIAGNOSIS — S79912A Unspecified injury of left hip, initial encounter: Secondary | ICD-10-CM | POA: Diagnosis not present

## 2021-09-04 DIAGNOSIS — Z8249 Family history of ischemic heart disease and other diseases of the circulatory system: Secondary | ICD-10-CM

## 2021-09-04 DIAGNOSIS — I249 Acute ischemic heart disease, unspecified: Secondary | ICD-10-CM | POA: Diagnosis not present

## 2021-09-04 DIAGNOSIS — I959 Hypotension, unspecified: Secondary | ICD-10-CM | POA: Diagnosis not present

## 2021-09-04 DIAGNOSIS — K219 Gastro-esophageal reflux disease without esophagitis: Secondary | ICD-10-CM | POA: Diagnosis present

## 2021-09-04 DIAGNOSIS — I517 Cardiomegaly: Secondary | ICD-10-CM | POA: Diagnosis not present

## 2021-09-04 DIAGNOSIS — I110 Hypertensive heart disease with heart failure: Secondary | ICD-10-CM | POA: Diagnosis not present

## 2021-09-04 DIAGNOSIS — Z91041 Radiographic dye allergy status: Secondary | ICD-10-CM | POA: Diagnosis not present

## 2021-09-04 DIAGNOSIS — M25462 Effusion, left knee: Secondary | ICD-10-CM | POA: Diagnosis not present

## 2021-09-04 DIAGNOSIS — I509 Heart failure, unspecified: Secondary | ICD-10-CM | POA: Diagnosis not present

## 2021-09-04 DIAGNOSIS — R791 Abnormal coagulation profile: Secondary | ICD-10-CM | POA: Diagnosis present

## 2021-09-04 DIAGNOSIS — Z955 Presence of coronary angioplasty implant and graft: Secondary | ICD-10-CM | POA: Diagnosis not present

## 2021-09-04 DIAGNOSIS — Z01810 Encounter for preprocedural cardiovascular examination: Secondary | ICD-10-CM | POA: Diagnosis not present

## 2021-09-04 DIAGNOSIS — M189 Osteoarthritis of first carpometacarpal joint, unspecified: Secondary | ICD-10-CM | POA: Diagnosis not present

## 2021-09-04 DIAGNOSIS — Z01818 Encounter for other preprocedural examination: Secondary | ICD-10-CM | POA: Diagnosis not present

## 2021-09-04 DIAGNOSIS — M25562 Pain in left knee: Secondary | ICD-10-CM | POA: Diagnosis not present

## 2021-09-04 DIAGNOSIS — H402290 Chronic angle-closure glaucoma, unspecified eye, stage unspecified: Secondary | ICD-10-CM | POA: Diagnosis present

## 2021-09-04 DIAGNOSIS — T402X5A Adverse effect of other opioids, initial encounter: Secondary | ICD-10-CM | POA: Diagnosis not present

## 2021-09-04 DIAGNOSIS — Z7984 Long term (current) use of oral hypoglycemic drugs: Secondary | ICD-10-CM

## 2021-09-04 DIAGNOSIS — R609 Edema, unspecified: Secondary | ICD-10-CM | POA: Diagnosis not present

## 2021-09-04 DIAGNOSIS — I25119 Atherosclerotic heart disease of native coronary artery with unspecified angina pectoris: Secondary | ICD-10-CM | POA: Diagnosis not present

## 2021-09-04 DIAGNOSIS — L299 Pruritus, unspecified: Secondary | ICD-10-CM | POA: Diagnosis not present

## 2021-09-04 LAB — CBC
HCT: 31.6 % — ABNORMAL LOW (ref 36.0–46.0)
Hemoglobin: 9.9 g/dL — ABNORMAL LOW (ref 12.0–15.0)
MCH: 28 pg (ref 26.0–34.0)
MCHC: 31.3 g/dL (ref 30.0–36.0)
MCV: 89.3 fL (ref 80.0–100.0)
Platelets: 139 10*3/uL — ABNORMAL LOW (ref 150–400)
RBC: 3.54 MIL/uL — ABNORMAL LOW (ref 3.87–5.11)
RDW: 16.6 % — ABNORMAL HIGH (ref 11.5–15.5)
WBC: 5.5 10*3/uL (ref 4.0–10.5)
nRBC: 0 % (ref 0.0–0.2)

## 2021-09-04 LAB — BASIC METABOLIC PANEL
Anion gap: 7 (ref 5–15)
BUN: 33 mg/dL — ABNORMAL HIGH (ref 8–23)
CO2: 26 mmol/L (ref 22–32)
Calcium: 9.3 mg/dL (ref 8.9–10.3)
Chloride: 107 mmol/L (ref 98–111)
Creatinine, Ser: 1.69 mg/dL — ABNORMAL HIGH (ref 0.44–1.00)
GFR, Estimated: 31 mL/min — ABNORMAL LOW (ref >60)
Glucose, Bld: 134 mg/dL — ABNORMAL HIGH (ref 70–99)
Potassium: 3.8 mmol/L (ref 3.5–5.1)
Sodium: 140 mmol/L (ref 135–145)

## 2021-09-04 LAB — PROTIME-INR
INR: 2.3 — ABNORMAL HIGH (ref 0.8–1.2)
Prothrombin Time: 25 seconds — ABNORMAL HIGH (ref 11.4–15.2)

## 2021-09-04 LAB — HEMOGLOBIN A1C
Hgb A1c MFr Bld: 5.5 % (ref 4.8–5.6)
Mean Plasma Glucose: 111.15 mg/dL

## 2021-09-04 LAB — GLUCOSE, CAPILLARY
Glucose-Capillary: 103 mg/dL — ABNORMAL HIGH (ref 70–99)
Glucose-Capillary: 132 mg/dL — ABNORMAL HIGH (ref 70–99)

## 2021-09-04 MED ORDER — DOCUSATE SODIUM 100 MG PO CAPS
100.0000 mg | ORAL_CAPSULE | Freq: Two times a day (BID) | ORAL | Status: DC
  Administered 2021-09-04 – 2021-09-05 (×2): 100 mg via ORAL
  Filled 2021-09-04 (×7): qty 1

## 2021-09-04 MED ORDER — ROSUVASTATIN CALCIUM 5 MG PO TABS
10.0000 mg | ORAL_TABLET | Freq: Every evening | ORAL | Status: DC
  Administered 2021-09-04 – 2021-09-11 (×8): 10 mg via ORAL
  Filled 2021-09-04 (×8): qty 2

## 2021-09-04 MED ORDER — ADULT MULTIVITAMIN W/MINERALS CH
1.0000 | ORAL_TABLET | Freq: Every day | ORAL | Status: DC
  Administered 2021-09-04 – 2021-09-12 (×9): 1 via ORAL
  Filled 2021-09-04 (×9): qty 1

## 2021-09-04 MED ORDER — DICYCLOMINE HCL 10 MG PO CAPS
10.0000 mg | ORAL_CAPSULE | Freq: Three times a day (TID) | ORAL | Status: DC
  Administered 2021-09-05 – 2021-09-11 (×17): 10 mg via ORAL
  Filled 2021-09-04 (×21): qty 1

## 2021-09-04 MED ORDER — LATANOPROST 0.005 % OP SOLN
1.0000 [drp] | Freq: Every day | OPHTHALMIC | Status: DC
  Administered 2021-09-04 – 2021-09-11 (×7): 1 [drp] via OPHTHALMIC
  Filled 2021-09-04 (×2): qty 2.5

## 2021-09-04 MED ORDER — INSULIN ASPART 100 UNIT/ML IJ SOLN
0.0000 [IU] | Freq: Three times a day (TID) | INTRAMUSCULAR | Status: DC
  Administered 2021-09-05 (×3): 2 [IU] via SUBCUTANEOUS
  Administered 2021-09-06: 3 [IU] via SUBCUTANEOUS
  Administered 2021-09-06: 2 [IU] via SUBCUTANEOUS
  Administered 2021-09-07 (×3): 3 [IU] via SUBCUTANEOUS
  Administered 2021-09-08 (×2): 2 [IU] via SUBCUTANEOUS
  Administered 2021-09-09 (×2): 3 [IU] via SUBCUTANEOUS
  Administered 2021-09-10 (×2): 2 [IU] via SUBCUTANEOUS
  Administered 2021-09-11 – 2021-09-12 (×2): 3 [IU] via SUBCUTANEOUS

## 2021-09-04 MED ORDER — METHOCARBAMOL 500 MG PO TABS
500.0000 mg | ORAL_TABLET | Freq: Four times a day (QID) | ORAL | Status: DC | PRN
  Administered 2021-09-04 – 2021-09-10 (×6): 500 mg via ORAL
  Filled 2021-09-04 (×6): qty 1

## 2021-09-04 MED ORDER — BUSPIRONE HCL 5 MG PO TABS
15.0000 mg | ORAL_TABLET | Freq: Three times a day (TID) | ORAL | Status: DC
  Filled 2021-09-04 (×2): qty 1
  Filled 2021-09-04 (×4): qty 3
  Filled 2021-09-04: qty 1

## 2021-09-04 MED ORDER — ONDANSETRON HCL 4 MG/2ML IJ SOLN
4.0000 mg | Freq: Four times a day (QID) | INTRAMUSCULAR | Status: DC | PRN
  Administered 2021-09-05: 4 mg via INTRAVENOUS
  Filled 2021-09-04: qty 2

## 2021-09-04 MED ORDER — INSULIN ASPART 100 UNIT/ML IJ SOLN
0.0000 [IU] | Freq: Every day | INTRAMUSCULAR | Status: DC
  Administered 2021-09-06 – 2021-09-09 (×2): 2 [IU] via SUBCUTANEOUS

## 2021-09-04 MED ORDER — OXYCODONE HCL 5 MG PO TABS
5.0000 mg | ORAL_TABLET | ORAL | Status: DC | PRN
  Administered 2021-09-04 – 2021-09-06 (×7): 10 mg via ORAL
  Administered 2021-09-08: 5 mg via ORAL
  Administered 2021-09-08 (×2): 10 mg via ORAL
  Filled 2021-09-04: qty 1
  Filled 2021-09-04 (×9): qty 2

## 2021-09-04 MED ORDER — ROSUVASTATIN CALCIUM 20 MG PO TABS: 20.0000 mg | ORAL_TABLET | Freq: Every day | ORAL | Status: DC

## 2021-09-04 MED ORDER — CLONAZEPAM 0.5 MG PO TABS: 0.5000 mg | ORAL_TABLET | Freq: Every evening | ORAL | Status: DC | PRN

## 2021-09-04 MED ORDER — POLYETHYLENE GLYCOL 3350 17 G PO PACK: 17.0000 g | PACK | Freq: Every day | ORAL | Status: DC | PRN

## 2021-09-04 MED ORDER — ASPIRIN EC 81 MG PO TBEC
81.0000 mg | DELAYED_RELEASE_TABLET | Freq: Every day | ORAL | Status: DC
  Administered 2021-09-05 – 2021-09-12 (×7): 81 mg via ORAL
  Filled 2021-09-04 (×8): qty 1

## 2021-09-04 MED ORDER — FENTANYL CITRATE PF 50 MCG/ML IJ SOSY
25.0000 ug | PREFILLED_SYRINGE | Freq: Once | INTRAMUSCULAR | Status: AC
  Administered 2021-09-04: 25 ug via INTRAVENOUS
  Filled 2021-09-04: qty 1

## 2021-09-04 MED ORDER — METHOCARBAMOL 1000 MG/10ML IJ SOLN
500.0000 mg | Freq: Four times a day (QID) | INTRAVENOUS | Status: DC | PRN
  Filled 2021-09-04: qty 5

## 2021-09-04 MED ORDER — TRAZODONE HCL 50 MG PO TABS
50.0000 mg | ORAL_TABLET | Freq: Every evening | ORAL | Status: DC | PRN
  Administered 2021-09-04 – 2021-09-06 (×2): 50 mg via ORAL
  Filled 2021-09-04 (×2): qty 1

## 2021-09-04 MED ORDER — MIDAZOLAM HCL 2 MG/2ML IJ SOLN
0.5000 mg | Freq: Once | INTRAMUSCULAR | Status: AC
  Administered 2021-09-04: 0.5 mg via INTRAVENOUS
  Filled 2021-09-04 (×2): qty 2

## 2021-09-04 MED ORDER — GLUCERNA SHAKE PO LIQD
237.0000 mL | Freq: Three times a day (TID) | ORAL | Status: DC
  Administered 2021-09-07 – 2021-09-09 (×3): 237 mL via ORAL

## 2021-09-04 MED ORDER — HYDROMORPHONE HCL 1 MG/ML IJ SOLN
0.5000 mg | INTRAMUSCULAR | Status: DC | PRN
  Administered 2021-09-04 – 2021-09-05 (×3): 0.5 mg via INTRAVENOUS
  Filled 2021-09-04: qty 1
  Filled 2021-09-04 (×2): qty 0.5

## 2021-09-04 MED ORDER — METOPROLOL SUCCINATE ER 50 MG PO TB24
50.0000 mg | ORAL_TABLET | Freq: Every day | ORAL | Status: DC
  Administered 2021-09-05 – 2021-09-11 (×7): 50 mg via ORAL
  Filled 2021-09-04 (×7): qty 1

## 2021-09-04 MED ORDER — HEPARIN (PORCINE) 25000 UT/250ML-% IV SOLN
1150.0000 [IU]/h | INTRAVENOUS | Status: DC
Start: 2021-09-04 — End: 2021-09-07
  Administered 2021-09-04: 950 [IU]/h via INTRAVENOUS
  Administered 2021-09-05 – 2021-09-07 (×3): 1150 [IU]/h via INTRAVENOUS
  Filled 2021-09-04 (×4): qty 250

## 2021-09-04 MED ORDER — BISACODYL 5 MG PO TBEC: 5.0000 mg | DELAYED_RELEASE_TABLET | Freq: Every day | ORAL | Status: DC | PRN

## 2021-09-04 MED ORDER — PANTOPRAZOLE SODIUM 40 MG PO TBEC
40.0000 mg | DELAYED_RELEASE_TABLET | Freq: Every day | ORAL | Status: DC
Start: 2021-09-05 — End: 2021-09-12
  Administered 2021-09-05 – 2021-09-12 (×8): 40 mg via ORAL
  Filled 2021-09-04 (×8): qty 1

## 2021-09-04 MED ORDER — SERTRALINE HCL 50 MG PO TABS
25.0000 mg | ORAL_TABLET | Freq: Every day | ORAL | Status: DC
  Administered 2021-09-05 – 2021-09-12 (×8): 25 mg via ORAL
  Filled 2021-09-04 (×8): qty 1

## 2021-09-04 NOTE — Progress Notes (Signed)
ANTICOAGULATION CONSULT NOTE - Initial Consult ? ?Pharmacy Consult for heparin ?Indication: mAVR ? ?Allergies  ?Allergen Reactions  ? Atorvastatin   ?  Muscle pain  ? Simvastatin Other (See Comments)  ?  Muscle pain  ? Sulfa Antibiotics   ?  unknown  ? Iodinated Contrast Media Rash  ?   Red rash after cardiac cath 1 wk ago, ? Contrast allergy, requires 13 hr prep now per dr.gallerani//a.calhoun ? Red rash after cardiac cath 1 wk ago, ? Contrast allergy, requires 13 hr prep now per dr.gallerani//a.calhoun  ? ? ?Patient Measurements: ?Height: '5\' 2"'$  (157.5 cm) ?Weight: 76.7 kg (169 lb) ?IBW/kg (Calculated) : 50.1 ?Heparin Dosing Weight: 66.8kg ? ?Vital Signs: ?Temp: 97.2 ?F (36.2 ?C) (04/14 1112) ?Temp Source: Temporal (04/14 1112) ?BP: 119/104 (04/14 1300) ?Pulse Rate: 56 (04/14 1300) ? ?Labs: ?Recent Labs  ?  09/04/21 ?1122 09/04/21 ?1236  ?HGB 9.9*  --   ?HCT 31.6*  --   ?PLT 139*  --   ?LABPROT  --  25.0*  ?INR  --  2.3*  ?CREATININE 1.69*  --   ? ? ?Estimated Creatinine Clearance: 27.1 mL/min (A) (by C-G formula based on SCr of 1.69 mg/dL (H)). ? ? ?Medical History: ?Past Medical History:  ?Diagnosis Date  ? Anemia   ? Anxiety   ? Aortic stenosis   ? a. Now has St Jude mechanical aortic valve (6/00). b. Echo (6/15) with EF 60-65%, mechanical aortic valve with mean gradient 27 mmHg.  ? Arthritis   ? a. Possible c-spine arthritis with pain down left arm.    ? Carotid artery disease ([place])   ? a. Carotid dopplers (7/16) with 40-59% BICA stenosis.    ? Chronic angle-closure glaucoma(365.23)   ? Chronic diastolic CHF (congestive heart failure) ([place])   ? a.  RHC (7/15) with mean RA 2, PA 22/11, mean PCWP 9, CI 3.79.  ? Contrast media allergy   ? Coronary atherosclerosis of native coronary artery   ? a. CABG at time of AVR in 6/00 with RIMA-RCA. b. Abnl nuc 11/2013 -> LHC (7/15) with patent RIMA-RCA, 80% mRCA, 80% pLAD with FFR 0.73, treated with DES to pLAD.  ? Depression   ? Essential hypertension   ? GERD  (gastroesophageal reflux disease)   ? Hyperlipidemia   ? Low back pain   ? Neuromuscular disorder ([place])   ? neuropathy  ? Peripheral vascular disease ([place])   ? a, H/o Right SFA stent. b. Peripheral arterial dopplers (7/16) with right SFA stent patent.    ? PONV (postoperative nausea and vomiting)   ? N&V  ? Type II diabetes mellitus ([place])   ? ? ?Assessment: ?52 YOF presenting with mechanical fall and femur fx, hx of mechanical AVR on warfarin PTA.  INR on admission slightly below mAVR goal of 2.5-3.5 and warfarin on hold for surgery ? ?Goal of Therapy:  ?Heparin level 0.3-0.7 units/ml ?Monitor platelets by anticoagulation protocol: Yes ?  ?Plan:  ?Heparin gtt at 950 units/hr, no bolus ?F/u 8 hour heparin level ?F/u intervention plans and ability to transition back to warfarin ? ?Bertis Ruddy, PharmD ?Clinical Pharmacist ?ED Pharmacist Phone # [phone] ?09/04/2021 2:29 PM ? ? ? ?

## 2021-09-04 NOTE — ED Notes (Signed)
Ortho tech at bedside. Versed IVP given prior to knee immobilizer.  ?

## 2021-09-04 NOTE — ED Provider Notes (Signed)
?[facility] ?Provider Note ? ? ?CSN: [id number] ?Arrival date & time: 09/04/21  1106 ? ?  ? ?History ? ?Chief Complaint  ?Patient presents with  ? Fall  ? ? ?Abigail Oliver is a 76 y.o. female. ? ?HPI ?76 yo female with history of st jude valve on coumadin, presents today with mechanical fall.  Patient dates she has a ongoing neuropathy.  She was trying to pass from 1 room to the other.  She had a mechanical fall landing on her left knee then proceeded to fall and hit the left side of her face.  She denies any loss of consciousness.  She has pain and swelling in the left knee.  She took two 5 mg hydrocodone's.  Her INR has been subtherapeutic and she has increased her Coumadin dose to 3 mg 4 times a week and half of that dose (1.5 mg) the other 3 days a week.  She denies any chest pain or lightheadedness prior to the fall.  She does not normally use any ambulatory assist.  Patient has been on anticoagulation since valve replacement over 20 years ago. ? ?  ? ?Home Medications ?Prior to Admission medications   ?Medication Sig Start Date End Date Taking? Authorizing Provider  ?alendronate (FOSAMAX) 10 MG tablet Take 1 tablet (10 mg total) by mouth daily before breakfast. Take with a full glass of water on an empty stomach. 07/24/19   Saguier, Percell Miller, PA-C  ?ascorbic acid (VITAMIN C) 500 MG tablet Take 500 mg by mouth as needed.     [provider]  ?aspirin EC 81 MG tablet Take 1 tablet (81 mg total) by mouth daily. 02/21/15   Larey Dresser, MD  ?benzonatate (TESSALON) 100 MG capsule Take 1 capsule (100 mg total) by mouth 3 (three) times daily as needed for cough. 10/29/20   Saguier, Percell Miller, PA-C  ?busPIRone (BUSPAR) 15 MG tablet TAKE ONE TABLET BY MOUTH THREE TIMES A DAY 07/27/21   Saguier, Percell Miller, PA-C  ?calcium-vitamin D (OSCAL WITH D) 500-200 MG-UNIT tablet Take 1 tablet by mouth 2 (two) times daily.     [provider]  ?clonazePAM (KLONOPIN) 0.5 MG tablet 1  tab po q hs prn anxiety or insomnia 10/29/20   Saguier, Percell Miller, PA-C  ?dicyclomine (BENTYL) 10 MG capsule Take 1 capsule (10 mg total) by mouth 3 (three) times daily before meals. 05/19/20   Saguier, Percell Miller, PA-C  ?FARXIGA 10 MG TABS tablet Take 10 mg by mouth daily. 03/27/20   [provider]  ?ferrous sulfate 325 (65 FE) MG tablet Take 1 tablet (325 mg total) by mouth daily. 10/28/13   Pattricia Boss, MD  ?folic acid (FOLVITE) 1 MG tablet Take 1 mg by mouth daily.    [provider]  ?latanoprost (XALATAN) 0.005 % ophthalmic solution PLACE 1 DROP INTO BOTH EYES AT BEDTIME. 07/25/14   Hoyt Koch, MD  ?metFORMIN (GLUCOPHAGE) 1000 MG tablet Take 1,000 mg by mouth daily with breakfast.  10/31/18   [provider]  ?metoprolol succinate (TOPROL-XL) 50 MG 24 hr tablet Take 1 tablet (50 mg total) by mouth daily. NEEDS FOLLOW UP APPOINTMENT FOR ANYMORE REFILLS 08/20/21   Larey Dresser, MD  ?pantoprazole (PROTONIX) 40 MG tablet TAKE ONE TABLET BY MOUTH DAILY 07/03/21   Saguier, Percell Miller, PA-C  ?potassium chloride SA (KLOR-CON M) 20 MEQ tablet Take 1 tablet (20 mEq total) by mouth 3 (three) times daily. 09/02/21   Lorretta Harp, MD  ?  rosuvastatin (CRESTOR) 20 MG tablet Take 1 tablet (20 mg total) by mouth daily. 12/30/16   Bensimhon, Shaune Pascal, MD  ?sertraline (ZOLOFT) 25 MG tablet TAKE ONE TABLET BY MOUTH DAILY 06/30/21   Saguier, Percell Miller, PA-C  ?torsemide (DEMADEX) 20 MG tablet Take 2 tablets (40 mg total) by mouth daily. 12/25/19   Bensimhon, Shaune Pascal, MD  ?traZODone (DESYREL) 50 MG tablet TAKE 1/2 TO 1 TABLET BY MOUTH EVERY NIGHT AT BEDTIME AS NEEDED FOR SLEEP 05/19/21   Saguier, Percell Miller, PA-C  ?warfarin (COUMADIN) 3 MG tablet TAKE 1/2 TO 1 TABLET DAILY AS DIRECTED 07/27/21   Larey Dresser, MD  ?   ? ?Allergies    ?Atorvastatin, Simvastatin, Sulfa antibiotics, and Iodinated contrast media   ? ?Review of Systems   ?Review of Systems  ?All other systems reviewed and are negative. ? ?Physical  Exam ?Updated Vital Signs ?BP (!) 119/104   Pulse (!) 56   Temp (!) 97.2 ?F (36.2 ?C) (Temporal)   Resp 20   Ht 1.575 m ('5\' 2"'$ )   Wt 76.7 kg   SpO2 94%   BMI 30.91 kg/m?  ?Physical Exam ?Vitals and nursing note reviewed.  ?Constitutional:   ?   Appearance: Normal appearance.  ?HENT:  ?   Head: Normocephalic and atraumatic.  ?   Right Ear: External ear normal.  ?   Left Ear: External ear normal.  ?   Nose: Nose normal.  ?Eyes:  ?   Extraocular Movements: Extraocular movements intact.  ?   Pupils: Pupils are equal, round, and reactive to light.  ?Cardiovascular:  ?   Rate and Rhythm: Normal rate and regular rhythm.  ?   Pulses: Normal pulses.  ?Pulmonary:  ?   Effort: Pulmonary effort is normal.  ?Abdominal:  ?   General: Abdomen is flat.  ?   Palpations: Abdomen is soft.  ?Musculoskeletal:  ?   Cervical back: Normal range of motion and neck supple.  ?   Comments: Swelling and tenderness to left knee with decreased range of motion ?Left hip is nontender ?Pulses are intact throughout the left lower extremity ?Patient has contusion of the left hand ?No point tenderness is noted ?No injuries noted to the right upper extremity the right lower extremity  ?Neurological:  ?   Mental Status: She is alert.  ? ? ?ED Results / Procedures / Treatments   ?Labs ?(all labs ordered are listed, but only abnormal results are displayed) ?Labs Reviewed  ?CBC - Abnormal; Notable for the following components:  ?    Result Value  ? RBC 3.54 (*)   ? Hemoglobin 9.9 (*)   ? HCT 31.6 (*)   ? RDW 16.6 (*)   ? Platelets 139 (*)   ? All other components within normal limits  ?BASIC METABOLIC PANEL - Abnormal; Notable for the following components:  ? Glucose, Bld 134 (*)   ? BUN 33 (*)   ? Creatinine, Ser 1.69 (*)   ? GFR, Estimated 31 (*)   ? All other components within normal limits  ?PROTIME-INR - Abnormal; Notable for the following components:  ? Prothrombin Time 25.0 (*)   ? INR 2.3 (*)   ? All other components within normal limits   ? ? ?EKG ?None ? ?Radiology ?CT Head Wo Contrast ? ?Result Date: 09/04/2021 ?CLINICAL DATA:  Head trauma, moderate-severe; Facial trauma, blunt; Neck trauma (Age >= 65y) EXAM: CT HEAD WITHOUT CONTRAST CT MAXILLOFACIAL WITHOUT CONTRAST CT CERVICAL SPINE WITHOUT CONTRAST TECHNIQUE: Multidetector  CT imaging of the head, cervical spine, and maxillofacial structures were performed using the standard protocol without intravenous contrast. Multiplanar CT image reconstructions of the cervical spine and maxillofacial structures were also generated. RADIATION DOSE REDUCTION: This exam was performed according to the departmental dose-optimization program which includes automated exposure control, adjustment of the mA and/or kV according to patient size and/or use of iterative reconstruction technique. COMPARISON:  CT cervical spine July 02, 2011. MRI head February 24, 2015 FINDINGS: CT HEAD FINDINGS Brain: No evidence of acute infarction, hemorrhage, hydrocephalus, extra-axial collection or mass lesion/mass effect. Patchy white matter hypodensities, nonspecific but compatible with chronic microvascular ischemic disease. Cerebral atrophy. Vascular: Calcific intracranial atherosclerosis. No hyperdense vessel identified. Skull: No acute fracture. Other: No mastoid effusions. CT MAXILLOFACIAL FINDINGS Osseous: No fracture or mandibular dislocation. No destructive process. Orbits: Negative. No traumatic or inflammatory finding. Sinuses: Clear sinuses. Soft tissues: Negative. CT CERVICAL SPINE FINDINGS Alignment: Trace retrolisthesis of C4 on C5, probably degenerative given degenerative change at this level. Otherwise, no substantial sagittal subluxation. Skull base and vertebrae: No evidence of acute fracture. Vertebral body heights are maintained. Partially imaged sclerotic lesion involving the T2 vertebral body, probably benign bone island given similar appearance on remote CT of the chest from 12/24/2013. Soft tissues and  spinal canal: No prevertebral fluid or swelling. No visible canal hematoma. Disc levels:  Mild for age multilevel degenerative change. Upper chest: Visualized lung apices are clear. IMPRESSION: 1. No evidence of acute intracr

## 2021-09-04 NOTE — Plan of Care (Signed)
?  Problem: Education: ?Goal: Knowledge of General Education information will improve ?Description: Including pain rating scale, medication(s)/side effects and non-pharmacologic comfort measures ?Outcome: Progressing ?  ?Problem: Coping: ?Goal: Level of anxiety will decrease ?Outcome: Progressing ?  ?Problem: Pain Managment: ?Goal: General experience of comfort will improve ?Outcome: Progressing ?  ?Problem: Safety: ?Goal: Ability to remain free from injury will improve ?Outcome: Progressing ?  ?Problem: Skin Integrity: ?Goal: Risk for impaired skin integrity will decrease ?Outcome: Progressing ?  ?Problem: Activity: ?Goal: Risk for activity intolerance will decrease ?Outcome: Not Progressing ?  ?

## 2021-09-04 NOTE — ED Notes (Signed)
Family updated as to patient's status, husband and son at bedside ? ?

## 2021-09-04 NOTE — H&P (Signed)
?History and Physical  ? ? ?Patient: Abigail Oliver:[id number] DOB: 1946-05-15 ?DOA: 09/04/2021 ?DOS: the patient was seen and examined on 09/04/2021 ?PCP: [name], PA-C  ?Patient coming from: Home - lives with husband; NOK: [place], [place], [phone] ? ? ?Chief Complaint: Fall ? ?HPI: [name] is a 76 y.o. female with medical history significant of CAD s/p CABG; AVR on Coumadin; glaucoma; chronic diastolic CHF; HTN; HLD; and DM presenting with a fall.   She reports that she has a h/o neuropathy (not painful, just bipedal numbness).  She was walking in the kitchen and turned.  She thought her foot was planted but it was not and she fell, landing directly on her left knee and then on her chin.  Her left 5th carpal-metacarpal region is sore and her chin also but no significant injuries other than her knee. ? ? ?ER Course:  Fall on Coumadin.  Neuropathy -> mechanical fall, femur fracture, in knee immobilizer.  INR 2.3.   ? ? ? ?Review of Systems: As mentioned in the history of present illness. All other systems reviewed and are negative. ?Past Medical History:  ?Diagnosis Date  ? Anemia   ? Anxiety   ? Aortic stenosis   ? a. Now has St Jude mechanical aortic valve (6/00). b. Echo (6/15) with EF 60-65%, mechanical aortic valve with mean gradient 27 mmHg.  ? Arthritis   ? a. Possible c-spine arthritis with pain down left arm.    ? Carotid artery disease ([place])   ? a. Carotid dopplers (7/16) with 40-59% BICA stenosis.    ? Chronic angle-closure glaucoma(365.23)   ? Chronic diastolic CHF (congestive heart failure) ([place])   ? a.  RHC (7/15) with mean RA 2, PA 22/11, mean PCWP 9, CI 3.79.  ? Contrast media allergy   ? Coronary atherosclerosis of native coronary artery   ? a. CABG at time of AVR in 6/00 with RIMA-RCA. b. Abnl nuc 11/2013 -> LHC (7/15) with patent RIMA-RCA, 80% mRCA, 80% pLAD with FFR 0.73, treated with DES to pLAD.  ? Depression   ? Essential hypertension   ? GERD (gastroesophageal  reflux disease)   ? Hyperlipidemia   ? Low back pain   ? Neuromuscular disorder ([place])   ? neuropathy  ? Peripheral vascular disease ([place])   ? a, H/o Right SFA stent. b. Peripheral arterial dopplers (7/16) with right SFA stent patent.    ? PONV (postoperative nausea and vomiting)   ? N&V  ? Type II diabetes mellitus ([place])   ? ?Past Surgical History:  ?Procedure Laterality Date  ? BILATERAL OOPHORECTOMY  05/24/1985  ? BREAST BIOPSY  05/24/2010  ? left  ? CARDIAC CATHETERIZATION  01/14/1999  ? normal LV function, severe aortic stenosis; 80% and 70% stenosis in RCA; mild 20% distal norrowing in L main with 20% proximal LAD stenosis, 40% diagonal stenosis and 20% proximal circumflex stenosis  ? CARDIAC VALVE REPLACEMENT  05/24/1998  ? aortic valve   ? CARDIOVASCULAR STRESS TEST  08/25/2011  ? R/L MV - EF 72%; no scintigraphic evidence of inducible MI; normal perfusionTID of 1.25 elevated - could indicate small vessle subendocardial ischemia; EKG NSR at 66, non diagnostic for ischemia  ? CARPAL TUNNEL RELEASE  05/25/1987  ? bilaterally  ? CATARACT EXTRACTION W/ INTRAOCULAR LENS  IMPLANT, BILATERAL  ~ 2007  ? [place]; 1971  ? CHOLECYSTECTOMY  05/24/1988  ? COLONOSCOPY N/A 10/27/2012  ? Procedure: COLONOSCOPY;  Surgeon: Tory Emerald  Benson Norway, MD;  Location: [facility];  Service: Endoscopy;  Laterality: N/A;  ? CORONARY ARTERY BYPASS GRAFT  05/24/1998  ? RIMA to RCA  ? DOPPLER ECHOCARDIOGRAPHY  03/29/2012  ? EF >55%; mild concentric LVH; stage 1 diastolic dysfunction, elevated LV filling pressure, dilated LA; MAC mild MR; St Jude AVR peak and mean gradients of 28mHg and 222mg; transvalvular gradients have increased (prev 23 and 14 respectively)  ? ESOPHAGOGASTRODUODENOSCOPY N/A 10/27/2012  ? Procedure: ESOPHAGOGASTRODUODENOSCOPY (EGD);  Surgeon: PaBeryle BeamsMD;  [facility];  Service: Endoscopy;  Laterality: N/A;  ? FEMORAL ARTERY STENT  09/20/2011  ? 6 x 40 Smart Nitinol self-expanding stent  placed;  10/15/2031 -R SFA stent open and patent w/o evidence of restenosis  ? FRACTIONAL FLOW RESERVE WIRE  12/14/2013  ? Procedure: FRACTIONAL FLOW RESERVE WIRE;  Surgeon: DaLarey DresserMD;  [facility];  Service: Cardiovascular;;  ? KNEE SURGERY Left   ? LACERATION REPAIR    ? right hand  ? LEFT AND RIGHT HEART CATHETERIZATION WITH CORONARY ANGIOGRAM N/A 12/14/2013  ? Procedure: LEFT AND RIGHT HEART CATHETERIZATION WITH CORONARY ANGIOGRAM;  Surgeon: DaLarey DresserMD;  [facility];  Service: Cardiovascular;  Laterality: N/A;  ? LOWER EXTREMITY ANGIOGRAM N/A 09/20/2011  ? Procedure: LOWER EXTREMITY ANGIOGRAM;  Surgeon: JoLorretta HarpMD;  [facility];  Service: Cardiovascular;  Laterality: N/A;  ? LYMPH NODE BIOPSY  06/25/2011  ? "core needle on 5"  ? needle biopsy  05/25/2007  ? "on ankles for nerve damage"  ? NEUROPLASTY / TRANSPOSITION ULNAR NERVE AT ELBOW    ? right  ? PERCUTANEOUS CORONARY STENT INTERVENTION (PCI-S)  12/14/2013  ? Procedure: PERCUTANEOUS CORONARY STENT INTERVENTION (PCI-S);  Surgeon: DaLarey DresserMD;  [facility];  Service: Cardiovascular;;  Prox LAD 3.00x12 Promus DES ?  ? PERIPHERAL ARTERIAL STENT GRAFT  09/20/2011  ? right SFA  ? REFRACTIVE SURGERY  ` 2004  ? "for glaucoma"  ? TONSILLECTOMY  05/24/1966  ? TRIGGER FINGER RELEASE Left 12/04/2015  ? Procedure: LEFT RING FINGER TRIGGER RELEASE;  Surgeon: KeLeanora CoverMD;  [facility];  Laterality: Left;  ? VAGINAL HYSTERECTOMY  05/25/1983  ? Fibroids  ? ?Social History:  reports that she has never smoked. She has never used smokeless tobacco. She reports that she does not drink alcohol and does not use drugs. ? ?Allergies  ?Allergen Reactions  ? Atorvastatin   ?  Muscle pain  ? Simvastatin Other (See Comments)  ?  Muscle pain  ? Sulfa Antibiotics   ?  unknown  ? Iodinated Contrast Media Rash  ?   Red rash after cardiac cath 1 wk ago, ? Contrast  allergy, requires 13 hr prep now per dr.gallerani//a.calhoun ? Red rash after cardiac cath 1 wk ago, ? Contrast allergy, requires 13 hr prep now per dr.gallerani//a.calhoun  ? ? ?Family History  ?Problem Relation Age of Onset  ? Colon cancer Mother   ? COPD Mother   ? Emphysema Mother   ? Cancer Maternal Grandmother   ? Cancer Maternal Grandfather   ? Heart disease Brother   ? Diabetes Neg Hx   ? ? ?Prior to Admission medications   ?Medication Sig Start Date End Date Taking? Authorizing Provider  ?alendronate (FOSAMAX) 10 MG tablet Take 1 tablet (10 mg total) by mouth daily before breakfast. Take with a full glass of water on an empty stomach. 07/24/19  Saguier, Percell Miller, PA-C  ?ascorbic acid (VITAMIN C) 500 MG tablet Take 500 mg by mouth as needed.     [provider]  ?aspirin EC 81 MG tablet Take 1 tablet (81 mg total) by mouth daily. 02/21/15   Larey Dresser, MD  ?benzonatate (TESSALON) 100 MG capsule Take 1 capsule (100 mg total) by mouth 3 (three) times daily as needed for cough. 10/29/20   Saguier, Percell Miller, PA-C  ?busPIRone (BUSPAR) 15 MG tablet TAKE ONE TABLET BY MOUTH THREE TIMES A DAY 07/27/21   Saguier, Percell Miller, PA-C  ?calcium-vitamin D (OSCAL WITH D) 500-200 MG-UNIT tablet Take 1 tablet by mouth 2 (two) times daily.     [provider]  ?clonazePAM (KLONOPIN) 0.5 MG tablet 1 tab po q hs prn anxiety or insomnia 10/29/20   Saguier, Percell Miller, PA-C  ?dicyclomine (BENTYL) 10 MG capsule Take 1 capsule (10 mg total) by mouth 3 (three) times daily before meals. 05/19/20   Saguier, Percell Miller, PA-C  ?FARXIGA 10 MG TABS tablet Take 10 mg by mouth daily. 03/27/20   [provider]  ?ferrous sulfate 325 (65 FE) MG tablet Take 1 tablet (325 mg total) by mouth daily. 10/28/13   Pattricia Boss, MD  ?folic acid (FOLVITE) 1 MG tablet Take 1 mg by mouth daily.    [provider]  ?latanoprost (XALATAN) 0.005 % ophthalmic solution PLACE 1 DROP INTO BOTH EYES AT BEDTIME. 07/25/14   Hoyt Koch, MD   ?metFORMIN (GLUCOPHAGE) 1000 MG tablet Take 1,000 mg by mouth daily with breakfast.  10/31/18   [provider]  ?metoprolol succinate (TOPROL-XL) 50 MG 24 hr tablet Take 1 tablet (50 mg total) by mouth

## 2021-09-04 NOTE — ED Notes (Signed)
Pt transported to CT via stretcher.  

## 2021-09-04 NOTE — ED Triage Notes (Addendum)
Per EMS pt transported from home after tripping due to neuropathy per pt, fell onto L knee (surgery end of March Alusio) then falling forward onto L hand and L cheek, no injury or pain noted to face, tenderness to L little finger, L knee.  GCS 15. Pt does take Coumadin.  ?No LOC, Pt took Oxy '10mg'$  prior to leaving home, 0/10 pain on arrival, only discomfort during movement ?

## 2021-09-04 NOTE — ED Notes (Signed)
M.Jeffries, ortho PA at bedside speaking with pt and family.  ?

## 2021-09-04 NOTE — Progress Notes (Signed)
Orthopedic Tech Progress Note ?Patient Details:  ?Abigail Oliver ?18-Jan-1946 ?[id number] ? ?Ortho Devices ?Type of Ortho Device: Knee Immobilizer ?Ortho Device/Splint Location: LLE ?Ortho Device/Splint Interventions: Ordered, Application, Adjustment ?  ?Post Interventions ?Patient Tolerated: Well ?Instructions Provided: Care of device ? ?Janit Pagan ?09/04/2021, 4:09 PM ? ?

## 2021-09-04 NOTE — Progress Notes (Signed)
Initial Nutrition Assessment ? ?DOCUMENTATION CODES:  ?Not applicable ? ?INTERVENTION:  ?Once diet advances to at least full liquids: ?-Glucerna Shake po TID, each supplement provides 220 kcal and 10 grams of protein ?-MVI with minerals daily ? ?NUTRITION DIAGNOSIS:  ?Increased nutrient needs related to other (see comment) (femur fracture) as evidenced by estimated needs. ? ?GOAL:  ?Patient will meet greater than or equal to 90% of their needs ? ?MONITOR:  ?PO intake, Supplement acceptance, Diet advancement, Labs, Weight trends ? ?REASON FOR ASSESSMENT:  ?Consult ?Hip fracture protocol ? ?ASSESSMENT:  ?Pt with PMH significant for CAD s/p CABG, AVR, glaucoma, CHF, HTN, HLD, and DM admitted with closed bicondylar fracture of distal femur. ? ?Pt in ED at time of RD assessment. Unable to obtained diet/weight history at this time. Will attempt to obtain at follow-up. Note pt is awaiting orthopedic consult.  ? ?Weight history reviewed. No significant weight changes noted.  ? ?PO Intake: none documented  ? ?Medications: ? docusate sodium  100 mg Oral BID  ? insulin aspart  0-15 Units Subcutaneous TID WC  ? insulin aspart  0-5 Units Subcutaneous QHS  ? pantoprazole  40 mg Oral Daily  ? ?Labs: ?Recent Labs  ?Lab 09/04/21 ?1122  ?NA 140  ?K 3.8  ?CL 107  ?CO2 26  ?BUN 33*  ?CREATININE 1.69*  ?CALCIUM 9.3  ?GLUCOSE 134*  ? ?NUTRITION - FOCUSED PHYSICAL EXAM: ?Unable to perform at this time. Will attempt at follow-up.  ? ?Diet Order:   ?Diet Order   ? ? None  ? ?  ? ?EDUCATION NEEDS:  ?No education needs have been identified at this time ? ?Skin:  Skin Assessment: Reviewed RN Assessment ? ?Last BM:  PTA ? ?Height:  ?Ht Readings from Last 1 Encounters:  ?09/04/21 '5\' 2"'$  (1.575 m)  ? ?Weight:  ?Wt Readings from Last 1 Encounters:  ?09/04/21 76.7 kg  ? ?BMI:  Body mass index is 30.91 kg/m?. ? ?Estimated Nutritional Needs:  ?Kcal:  1900-2100 ?Protein:  95-105 grams ?Fluid:  >1.9L/d ? ? ?Theone Stanley., MS, RD, LDN (she/her/hers) ?RD  pager number and weekend/on-call pager number located in [place]. ?

## 2021-09-04 NOTE — Consult Note (Addendum)
Reason for Consult:Left distal femur fx Referring Physician: Margarita Grizzle Time called: 1306 Time at bedside: 1312   [name] is an 76 y.o. female.  HPI: Abigail Oliver was turning in her home when her neuropathy caused her to lose her balance and fall onto the hardwood floor. She had immediate left knee pain and could not get up. She was brought to the ED where x-rays showed a left distal femur fx and orthopedic surgery was consulted. 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  Past Medical History:  Diagnosis Date   Anemia    Anxiety    Aortic stenosis    a. Now has St Jude mechanical aortic valve (6/00). b. Echo (6/15) with EF 60-65%, mechanical aortic valve with mean gradient 27 mmHg.   Arthritis    a. Possible c-spine arthritis with pain down left arm.     Carotid artery disease (HCC)    a. Carotid dopplers (7/16) with 40-59% BICA stenosis.     Chronic angle-closure glaucoma(365.23)    Chronic diastolic CHF (congestive heart failure) (HCC)    a.  RHC (7/15) with mean RA 2, PA 22/11, mean PCWP 9, CI 3.79.   Colon polyps    Contrast media allergy    Coronary atherosclerosis of native coronary artery    a. CABG at time of AVR in 6/00 with RIMA-RCA. b. Abnl nuc 11/2013 -> LHC (7/15) with patent RIMA-RCA, 80% mRCA, 80% pLAD with FFR 0.73, treated with DES to pLAD.   Depression    Dizziness 02/09/2012   a. Holter (8/14) with few PACs, otherwise unremarkable.    Elevated transaminase level    Essential hypertension    Gastritis    GERD (gastroesophageal reflux disease)    Hyperlipidemia    Low back pain    Mechanical heart valve present    Neuromuscular disorder (HCC)    neuropathy   Peripheral vascular disease (HCC)    a, H/o Right SFA stent. b. Peripheral arterial dopplers (7/16) with right SFA stent patent.     PONV (postoperative nausea and vomiting)    N&V   Pulmonary nodules    a.  Noted by CT in 6/15.  CT chest (8/15) was thought to indicate that nodules were benign.  No further workup was recommended.    Syncope 03/29/2012   carotid doppler - R ICA 50-69% reduction by velocities (low end); L ICA 0-49% reduction by velocities (low end); R and L subclavian arteries - <50% redcution; R and L vertebral arteries show normal antegrade flow   Type II diabetes mellitus (HCC)     Past Surgical History:  Procedure Laterality Date   BILATERAL OOPHORECTOMY  05/24/1985   BREAST BIOPSY  05/24/2010   left   CARDIAC CATHETERIZATION  01/14/1999   normal LV function, severe aortic stenosis; 80% and 70% stenosis in RCA; mild 20% distal norrowing in L main with 20% proximal LAD stenosis, 40% diagonal stenosis and 20% proximal circumflex stenosis   CARDIAC VALVE REPLACEMENT  05/24/1998   aortic valve    CARDIOVASCULAR STRESS TEST  08/25/2011   R/L MV - EF 72%; no scintigraphic evidence of inducible MI; normal perfusionTID of 1.25 elevated - could indicate small vessle subendocardial ischemia; EKG NSR at 66, non diagnostic for ischemia   CARPAL TUNNEL RELEASE  05/25/1987   bilaterally   CATARACT EXTRACTION W/ INTRAOCULAR LENS  IMPLANT, BILATERAL  ~ 2007  CESAREAN SECTION  1969; 1971   CHOLECYSTECTOMY  05/24/1988   COLONOSCOPY N/A 10/27/2012   Procedure: COLONOSCOPY;  Surgeon: Theda Belfast, MD;  Location: [facility];  Service: Endoscopy;  Laterality: N/A;   CORONARY ARTERY BYPASS GRAFT  05/24/1998   RIMA to RCA   DOPPLER ECHOCARDIOGRAPHY  03/29/2012   EF >55%; mild concentric LVH; stage 1 diastolic dysfunction, elevated LV filling pressure, dilated LA; MAC mild MR; St Jude AVR peak and mean gradients of and ; transvalvular gradients have increased (prev 23 and 14 respectively)   ESOPHAGOGASTRODUODENOSCOPY N/A 10/27/2012   Procedure: ESOPHAGOGASTRODUODENOSCOPY (EGD);  Surgeon: Theda Belfast, MD;  Location: [facility];  Service: Endoscopy;  Laterality: N/A;   FEMORAL  ARTERY STENT  09/20/2011   6 x 40 Smart Nitinol self-expanding stent placed;  10/15/2031 -R SFA stent open and patent w/o evidence of restenosis   FRACTIONAL FLOW RESERVE WIRE  12/14/2013   Procedure: FRACTIONAL FLOW RESERVE WIRE;  Surgeon: Laurey Morale, MD;  Location: [facility] CATH LAB;  Service: Cardiovascular;;   KNEE SURGERY Left    LACERATION REPAIR     right hand   LEFT AND RIGHT HEART CATHETERIZATION WITH CORONARY ANGIOGRAM N/A 12/14/2013   Procedure: LEFT AND RIGHT HEART CATHETERIZATION WITH CORONARY ANGIOGRAM;  Surgeon: Laurey Morale, MD;  Location: [facility] C[facility] LAB;  Service: Cardiovascular;  Laterality: N/A;   LOWER EXTREMITY ANGIOGRAM N/A 09/20/2011   Procedure: LOWER EXTREMITY ANGIOGRAM;  Surgeon: Runell Gess, MD;  Location: [facility] CATH LAB;  Service: Cardiovascular;  Laterality: N/A;   LYMPH NODE BIOPSY  06/25/2011   "core needle on 5"   needle biopsy  05/25/2007   "on ankles for nerve damage"   NEUROPLASTY / TRANSPOSITION ULNAR NERVE AT ELBOW     right   PERCUTANEOUS CORONARY STENT INTERVENTION (PCI-S)  12/14/2013   Procedure: PERCUTANEOUS CORONARY STENT INTERVENTION (PCI-S);  Surgeon: Laurey Morale, MD;  Location: [facility] CATH LAB;  Service: Cardiovascular;;  Prox LAD 3.00x12 Promus DES    PERIPHERAL ARTERIAL STENT GRAFT  09/20/2011   right SFA   REFRACTIVE SURGERY  ` 2004   "for glaucoma"   TONSILLECTOMY  05/24/1966   TRIGGER FINGER RELEASE Left 12/04/2015   Procedure: LEFT RING FINGER TRIGGER RELEASE;  Surgeon: Betha Loa, MD;  Location: [facility];  Service: Orthopedics;  Laterality: Left;   VAGINAL HYSTERECTOMY  05/25/1983   Fibroids    Family History  Problem Relation Age of Onset   Colon cancer Mother    COPD Mother    Emphysema Mother    Cancer Maternal Grandmother    Cancer Maternal Grandfather    Heart disease Brother    Diabetes Neg Hx     Social History:  reports that she has never smoked. She has never used smokeless tobacco. She reports  that she does not drink alcohol and does not use drugs.  Allergies:  Allergies  Allergen Reactions   Atorvastatin     Muscle pain   Simvastatin Other (See Comments)    Muscle pain   Sulfa Antibiotics     unknown   Iodinated Contrast Media Rash     Red rash after cardiac cath 1 wk ago, ? Contrast allergy, requires 13 hr prep now per [email or web address]  Red rash after cardiac cath 1 wk ago, ? Contrast allergy, requires 13 hr prep now per [email or web address]    Medications: I have reviewed the patient's current medications.  Results for orders placed or performed during the hospital encounter  of 09/04/21 (from the past 48 hour(s))  CBC     Status: Abnormal   Collection Time: 09/04/21 11:22 AM  Result Value Ref Range   WBC 5.5 4.0 - 10.5 K/uL   RBC 3.54 (L) 3.87 - 5.11 MIL/uL   Hemoglobin 9.9 (L) 12.0 - 15.0 g/dL   HCT 53.6 (L) 64.4 - 03.4 %   MCV 89.3 80.0 - 100.0 fL   MCH 28.0 26.0 - 34.0 pg   MCHC 31.3 30.0 - 36.0 g/dL   RDW 74.2 (H) 59.5 - 63.8 %   Platelets 139 (L) 150 - 400 K/uL   nRBC 0.0 0.0 - 0.2 %    Comment: Performed at [facility], [address] [place], [place] [address]  Basic metabolic panel     Status: Abnormal   Collection Time: 09/04/21 11:22 AM  Result Value Ref Range   Sodium 140 135 - 145 mmol/L   Potassium 3.8 3.5 - 5.1 mmol/L   Chloride 107 98 - 111 mmol/L   CO2 26 22 - 32 mmol/L   Glucose, Bld 134 (H) 70 - 99 mg/dL    Comment: Glucose reference range applies only to samples taken after fasting for at least 8 hours.   BUN 33 (H) 8 - 23 mg/dL   Creatinine, Ser 3.29 (H) 0.44 - 1.00 mg/dL   Calcium 9.3 8.9 - 51.8 mg/dL   GFR, Estimated 31 (L) >60 mL/min    Comment: (NOTE) Calculated using the CKD-EPI Creatinine Equation (2021)    Anion gap 7 5 - 15    Comment: Performed at [facility], [address], [place], [place] [address]  Protime-INR     Status: Abnormal   Collection Time: 09/04/21 12:36 PM  Result Value Ref Range    Prothrombin Time 25.0 (H) 11.4 - 15.2 seconds   INR 2.3 (H) 0.8 - 1.2    Comment: (NOTE) INR goal varies based on device and disease states. Performed at [facility], [address] [place], [place] [address]    *Note: Due to a large number of results and/or encounters for the requested time period, some results have not been displayed. A complete set of results can be found in Results Review.    CT Head Wo Contrast  Result Date: 09/04/2021 CLINICAL DATA:  Head trauma, moderate-severe; Facial trauma, blunt; Neck trauma (Age >= 65y) EXAM: CT HEAD WITHOUT CONTRAST CT MAXILLOFACIAL WITHOUT CONTRAST CT CERVICAL SPINE WITHOUT CONTRAST TECHNIQUE: Multidetector CT imaging of the head, cervical spine, and maxillofacial structures were performed using the standard protocol without intravenous contrast. Multiplanar CT image reconstructions of the cervical spine and maxillofacial structures were also generated. RADIATION DOSE REDUCTION: This exam was performed according to the departmental dose-optimization program which includes automated exposure control, adjustment of the mA and/or kV according to patient size and/or use of iterative reconstruction technique. COMPARISON:  CT cervical spine July 02, 2011. MRI head February 24, 2015 FINDINGS: CT HEAD FINDINGS Brain: No evidence of acute infarction, hemorrhage, hydrocephalus, extra-axial collection or mass lesion/mass effect. Patchy white matter hypodensities, nonspecific but compatible with chronic microvascular ischemic disease. Cerebral atrophy. Vascular: Calcific intracranial atherosclerosis. No hyperdense vessel identified. Skull: No acute fracture. Other: No mastoid effusions. CT MAXILLOFACIAL FINDINGS Osseous: No fracture or mandibular dislocation. No destructive process. Orbits: Negative. No traumatic or inflammatory finding. Sinuses: Clear sinuses. Soft tissues: Negative. CT CERVICAL SPINE FINDINGS Alignment: Trace retrolisthesis of C4 on C5,  probably degenerative given degenerative change at this level.  Otherwise, no substantial sagittal subluxation. Skull base and vertebrae: No evidence of acute fracture. Vertebral body heights are maintained. Partially imaged sclerotic lesion involving the T2 vertebral body, probably benign bone island given similar appearance on remote CT of the chest from 12/24/2013. Soft tissues and spinal canal: No prevertebral fluid or swelling. No visible canal hematoma. Disc levels:  Mild for age multilevel degenerative change. Upper chest: Visualized lung apices are clear. IMPRESSION: 1. No evidence of acute intracranial abnormality. 2. No evidence of acute facial fracture. 3. No evidence of acute fracture or traumatic malalignment the cervical spine. Electronically Signed   By: Feliberto Harts M.D.   On: 09/04/2021 12:17   CT Cervical Spine Wo Contrast  Result Date: 09/04/2021 CLINICAL DATA:  Head trauma, moderate-severe; Facial trauma, blunt; Neck trauma (Age >= 65y) EXAM: CT HEAD WITHOUT CONTRAST CT MAXILLOFACIAL WITHOUT CONTRAST CT CERVICAL SPINE WITHOUT CONTRAST TECHNIQUE: Multidetector CT imaging of the head, cervical spine, and maxillofacial structures were performed using the standard protocol without intravenous contrast. Multiplanar CT image reconstructions of the cervical spine and maxillofacial structures were also generated. RADIATION DOSE REDUCTION: This exam was performed according to the departmental dose-optimization program which includes automated exposure control, adjustment of the mA and/or kV according to patient size and/or use of iterative reconstruction technique. COMPARISON:  CT cervical spine July 02, 2011. MRI head February 24, 2015 FINDINGS: CT HEAD FINDINGS Brain: No evidence of acute infarction, hemorrhage, hydrocephalus, extra-axial collection or mass lesion/mass effect. Patchy white matter hypodensities, nonspecific but compatible with chronic microvascular ischemic disease. Cerebral  atrophy. Vascular: Calcific intracranial atherosclerosis. No hyperdense vessel identified. Skull: No acute fracture. Other: No mastoid effusions. CT MAXILLOFACIAL FINDINGS Osseous: No fracture or mandibular dislocation. No destructive process. Orbits: Negative. No traumatic or inflammatory finding. Sinuses: Clear sinuses. Soft tissues: Negative. CT CERVICAL SPINE FINDINGS Alignment: Trace retrolisthesis of C4 on C5, probably degenerative given degenerative change at this level. Otherwise, no substantial sagittal subluxation. Skull base and vertebrae: No evidence of acute fracture. Vertebral body heights are maintained. Partially imaged sclerotic lesion involving the T2 vertebral body, probably benign bone island given similar appearance on remote CT of the chest from 12/24/2013. Soft tissues and spinal canal: No prevertebral fluid or swelling. No visible canal hematoma. Disc levels:  Mild for age multilevel degenerative change. Upper chest: Visualized lung apices are clear. IMPRESSION: 1. No evidence of acute intracranial abnormality. 2. No evidence of acute facial fracture. 3. No evidence of acute fracture or traumatic malalignment the cervical spine. Electronically Signed   By: Feliberto Harts M.D.   On: 09/04/2021 12:17   DG Chest Port 1 View  Result Date: 09/04/2021 CLINICAL DATA:  Preoperative evaluation for knee surgery EXAM: PORTABLE CHEST 1 VIEW COMPARISON:  Portable exam 1310 hours compared to 06/18/2019 FINDINGS: Enlargement of cardiac silhouette post AVR. Mediastinal contours and pulmonary vascularity normal. Atherosclerotic calcification aorta. Chronic bronchitic changes without pulmonary infiltrate, pleural effusion or pneumothorax. Bones demineralized. IMPRESSION: Minimal enlargement of cardiac silhouette post AVR. Chronic bronchitic changes without infiltrate. Electronically Signed   By: Ulyses Southward M.D.   On: 09/04/2021 13:19   DG Knee Complete 4 Views Left  Result Date: 09/04/2021 CLINICAL  DATA:  76 year old female with a history of fall EXAM: LEFT KNEE - COMPLETE 4+ VIEW COMPARISON:  None. FINDINGS: Fracture line involving the medial supracondylar region extending inferiorly to the intracondylar notch. Fracture line of the lateral condyle. Joint effusion. IMPRESSION: Bicondylar femoral fracture with joint effusion Electronically Signed   By: Gilmer Mor  D.O.   On: 09/04/2021 12:27   DG Hand Complete Left  Result Date: 09/04/2021 CLINICAL DATA:  Trauma, fall EXAM: LEFT HAND - COMPLETE 3+ VIEW COMPARISON:  None. FINDINGS: No displaced fracture or dislocation is seen. Degenerative changes are noted in the first carpometacarpal joint. Deformity in the base fifth metatarsal may be residual from previous injury. Small bony spurs seen in the interphalangeal joints. IMPRESSION: No recent fracture or dislocation is seen in the left hand. Electronically Signed   By: Ernie Avena M.D.   On: 09/04/2021 12:27   DG Hip Unilat W or Wo Pelvis 2-3 Views Left  Result Date: 09/04/2021 CLINICAL DATA:  Trauma, fall EXAM: DG HIP (WITH OR WITHOUT PELVIS) 2-3V LEFT COMPARISON:  None. FINDINGS: No recent fracture or dislocation is seen. Degenerative changes are noted in the visualized lower lumbar spine. IMPRESSION: No fracture or dislocation is seen in the left hip. Electronically Signed   By: Ernie Avena M.D.   On: 09/04/2021 12:25   CT Maxillofacial Wo Contrast  Result Date: 09/04/2021 CLINICAL DATA:  Head trauma, moderate-severe; Facial trauma, blunt; Neck trauma (Age >= 65y) EXAM: CT HEAD WITHOUT CONTRAST CT MAXILLOFACIAL WITHOUT CONTRAST CT CERVICAL SPINE WITHOUT CONTRAST TECHNIQUE: Multidetector CT imaging of the head, cervical spine, and maxillofacial structures were performed using the standard protocol without intravenous contrast. Multiplanar CT image reconstructions of the cervical spine and maxillofacial structures were also generated. RADIATION DOSE REDUCTION: This exam was  performed according to the departmental dose-optimization program which includes automated exposure control, adjustment of the mA and/or kV according to patient size and/or use of iterative reconstruction technique. COMPARISON:  CT cervical spine July 02, 2011. MRI head February 24, 2015 FINDINGS: CT HEAD FINDINGS Brain: No evidence of acute infarction, hemorrhage, hydrocephalus, extra-axial collection or mass lesion/mass effect. Patchy white matter hypodensities, nonspecific but compatible with chronic microvascular ischemic disease. Cerebral atrophy. Vascular: Calcific intracranial atherosclerosis. No hyperdense vessel identified. Skull: No acute fracture. Other: No mastoid effusions. CT MAXILLOFACIAL FINDINGS Osseous: No fracture or mandibular dislocation. No destructive process. Orbits: Negative. No traumatic or inflammatory finding. Sinuses: Clear sinuses. Soft tissues: Negative. CT CERVICAL SPINE FINDINGS Alignment: Trace retrolisthesis of C4 on C5, probably degenerative given degenerative change at this level. Otherwise, no substantial sagittal subluxation. Skull base and vertebrae: No evidence of acute fracture. Vertebral body heights are maintained. Partially imaged sclerotic lesion involving the T2 vertebral body, probably benign bone island given similar appearance on remote CT of the chest from 12/24/2013. Soft tissues and spinal canal: No prevertebral fluid or swelling. No visible canal hematoma. Disc levels:  Mild for age multilevel degenerative change. Upper chest: Visualized lung apices are clear. IMPRESSION: 1. No evidence of acute intracranial abnormality. 2. No evidence of acute facial fracture. 3. No evidence of acute fracture or traumatic malalignment the cervical spine. Electronically Signed   By: Feliberto Harts M.D.   On: 09/04/2021 12:17    Review of Systems  HENT:  Negative for ear discharge, ear pain, hearing loss and tinnitus.   Eyes:  Negative for photophobia and pain.   Respiratory:  Negative for cough and shortness of breath.   Cardiovascular:  Negative for chest pain.  Gastrointestinal:  Negative for abdominal pain, nausea and vomiting.  Genitourinary:  Negative for dysuria, flank pain, frequency and urgency.  Musculoskeletal:  Positive for arthralgias (Left knee). Negative for back pain, myalgias and neck pain.  Neurological:  Negative for dizziness and headaches.  Hematological:  Does not bruise/bleed easily.  Psychiatric/Behavioral:  The  patient is not nervous/anxious.   Blood pressure (!) 119/104, pulse (!) 56, temperature (!) 97.2 F (36.2 C), temperature source Temporal, resp. rate 20, height 5\' 2"  (1.575 m), weight 76.7 kg, SpO2 94 %. Physical Exam Constitutional:      General: She is not in acute distress.    Appearance: She is well-developed. She is not diaphoretic.  HENT:     Head: Normocephalic and atraumatic.  Eyes:     General: No scleral icterus.       Right eye: No discharge.        Left eye: No discharge.     Conjunctiva/sclera: Conjunctivae normal.  Cardiovascular:     Rate and Rhythm: Normal rate and regular rhythm.  Pulmonary:     Effort: Pulmonary effort is normal. No respiratory distress.  Musculoskeletal:     Cervical back: Normal range of motion.     Comments: LLE No traumatic wounds, ecchymosis, or rash  Severe TTP knee  No ankle effusion  Sens DPN, SPN, TN paresthetic (chronic)  Motor EHL, ext, flex, evers 5/5  DP 2+, PT 0, No significant edema  Skin:    General: Skin is warm and dry.  Neurological:     Mental Status: She is alert.  Psychiatric:        Mood and Affect: Mood normal.        Behavior: Behavior normal.    Assessment/Plan: Left distal femur fx -- Will get CT to better characterize fx. Could foresee both operative and non-operative pathways. KI and NWB for now.    Freeman Caldron, PA-C Orthopedic Surgery [phone] 09/04/2021, 1:29 PM   Attending note- I have seen and examined Abigail Oliver and  agree with the above findings and assessment. I know her from a recent knee arthroscopy. She fell and has a bicondylar distal femur fracture with intercondylar extension. The fracture is surprisingly non-displaced, despite this being a fairly aggressive fracture pattern. Fortunately her compartments are soft and she has a small effusion but not much other swelling in the leg/thigh. Her motor function and pulses are intact distally. Her sensation is diminished chronically from her neuropathy. She is currently in a knee immobilizer. She has a fairly complex fracture pattern, which despite being non-displaced, will most likely require surgical intervention for stabilization. I feel that her interests would be best served by our Orthopaedic trauma colleagues Dr Jena Gauss or Dr Carola Frost . Will refer her to them for further treatment

## 2021-09-05 DIAGNOSIS — I2510 Atherosclerotic heart disease of native coronary artery without angina pectoris: Secondary | ICD-10-CM

## 2021-09-05 DIAGNOSIS — Z952 Presence of prosthetic heart valve: Secondary | ICD-10-CM | POA: Diagnosis not present

## 2021-09-05 DIAGNOSIS — S72432A Displaced fracture of medial condyle of left femur, initial encounter for closed fracture: Secondary | ICD-10-CM | POA: Diagnosis not present

## 2021-09-05 DIAGNOSIS — S72422A Displaced fracture of lateral condyle of left femur, initial encounter for closed fracture: Secondary | ICD-10-CM | POA: Diagnosis not present

## 2021-09-05 LAB — TROPONIN I (HIGH SENSITIVITY)
Troponin I (High Sensitivity): 44 ng/L — ABNORMAL HIGH (ref <18)
Troponin I (High Sensitivity): 292 ng/L (ref <18)

## 2021-09-05 LAB — CBC
HCT: 30.5 % — ABNORMAL LOW (ref 36.0–46.0)
Hemoglobin: 9.4 g/dL — ABNORMAL LOW (ref 12.0–15.0)
MCH: 27.2 pg (ref 26.0–34.0)
MCHC: 30.8 g/dL (ref 30.0–36.0)
MCV: 88.2 fL (ref 80.0–100.0)
Platelets: 113 10*3/uL — ABNORMAL LOW (ref 150–400)
RBC: 3.46 MIL/uL — ABNORMAL LOW (ref 3.87–5.11)
RDW: 17.2 % — ABNORMAL HIGH (ref 11.5–15.5)
WBC: 6.6 10*3/uL (ref 4.0–10.5)
nRBC: 0 % (ref 0.0–0.2)

## 2021-09-05 LAB — GLUCOSE, CAPILLARY
Glucose-Capillary: 112 mg/dL — ABNORMAL HIGH (ref 70–99)
Glucose-Capillary: 124 mg/dL — ABNORMAL HIGH (ref 70–99)
Glucose-Capillary: 128 mg/dL — ABNORMAL HIGH (ref 70–99)
Glucose-Capillary: 130 mg/dL — ABNORMAL HIGH (ref 70–99)
Glucose-Capillary: 132 mg/dL — ABNORMAL HIGH (ref 70–99)

## 2021-09-05 LAB — HEPARIN LEVEL (UNFRACTIONATED)
Heparin Unfractionated: 0.19 IU/mL — ABNORMAL LOW (ref 0.30–0.70)
Heparin Unfractionated: 0.33 IU/mL (ref 0.30–0.70)
Heparin Unfractionated: 0.34 IU/mL (ref 0.30–0.70)

## 2021-09-05 MED ORDER — FENTANYL CITRATE PF 50 MCG/ML IJ SOSY
12.5000 ug | PREFILLED_SYRINGE | INTRAMUSCULAR | Status: DC | PRN
  Administered 2021-09-05 – 2021-09-09 (×7): 12.5 ug via INTRAVENOUS
  Filled 2021-09-05 (×7): qty 1

## 2021-09-05 MED ORDER — ALUM & MAG HYDROXIDE-SIMETH 200-200-20 MG/5ML PO SUSP
30.0000 mL | ORAL | Status: DC | PRN
  Administered 2021-09-05: 30 mL via ORAL
  Filled 2021-09-05: qty 30

## 2021-09-05 MED ORDER — DIPHENHYDRAMINE HCL 50 MG/ML IJ SOLN
12.5000 mg | Freq: Once | INTRAMUSCULAR | Status: AC
  Administered 2021-09-05: 12.5 mg via INTRAVENOUS
  Filled 2021-09-05: qty 1

## 2021-09-05 NOTE — Progress Notes (Signed)
ANTICOAGULATION CONSULT NOTE - Initial Consult ? ?Pharmacy Consult for heparin ?Indication: mAVR ? ?Allergies  ?Allergen Reactions  ? Atorvastatin   ?  Muscle pain  ? Dapagliflozin   ?  Other reaction(s): nausea  ? Gabapentin   ?  Other reaction(s): stomach issues  ? Simvastatin Other (See Comments)  ?  Muscle pain  ? Sulfa Antibiotics   ?  unknown  ? Iodinated Contrast Media Rash  ?   Red rash after cardiac cath 1 wk ago, ? Contrast allergy, requires 13 hr prep now per dr.gallerani//a.calhoun ?   ? ? ?Patient Measurements: ?Height: '5\' 2"'$  (157.5 cm) ?Weight: 76.7 kg (169 lb) ?IBW/kg (Calculated) : 50.1 ?Heparin Dosing Weight: 66.8kg ? ?Vital Signs: ?Temp: 99.6 ?F (37.6 ?C) (04/15 0749) ?Temp Source: Oral (04/15 0749) ?BP: 108/49 (04/15 0749) ?Pulse Rate: 40 (04/15 0749) ? ?Labs: ?Recent Labs  ?  09/04/21 ?1122 09/04/21 ?1236 09/04/21 ?2347 09/05/21 ?0917  ?HGB 9.9*  --   --  9.4*  ?HCT 31.6*  --   --  30.5*  ?PLT 139*  --   --  PENDING  ?LABPROT  --  25.0*  --   --   ?INR  --  2.3*  --   --   ?HEPARINUNFRC  --   --  0.19* 0.33  ?CREATININE 1.69*  --   --   --   ? ? ? ?Estimated Creatinine Clearance: 27.1 mL/min (A) (by C-G formula based on SCr of 1.69 mg/dL (H)). ? ? ?Medical History: ?Past Medical History:  ?Diagnosis Date  ? Anemia   ? Anxiety   ? Aortic stenosis   ? a. Now has St Jude mechanical aortic valve (6/00). b. Echo (6/15) with EF 60-65%, mechanical aortic valve with mean gradient 27 mmHg.  ? Arthritis   ? a. Possible c-spine arthritis with pain down left arm.    ? Carotid artery disease ([place])   ? a. Carotid dopplers (7/16) with 40-59% BICA stenosis.    ? Chronic angle-closure glaucoma(365.23)   ? Chronic diastolic CHF (congestive heart failure) ([place])   ? a.  RHC (7/15) with mean RA 2, PA 22/11, mean PCWP 9, CI 3.79.  ? Contrast media allergy   ? Coronary atherosclerosis of native coronary artery   ? a. CABG at time of AVR in 6/00 with RIMA-RCA. b. Abnl nuc 11/2013 -> LHC (7/15) with patent RIMA-RCA, 80%  mRCA, 80% pLAD with FFR 0.73, treated with DES to pLAD.  ? Depression   ? Essential hypertension   ? GERD (gastroesophageal reflux disease)   ? Hyperlipidemia   ? Low back pain   ? Neuromuscular disorder ([place])   ? neuropathy  ? Peripheral vascular disease ([place])   ? a, H/o Right SFA stent. b. Peripheral arterial dopplers (7/16) with right SFA stent patent.    ? PONV (postoperative nausea and vomiting)   ? N&V  ? Type II diabetes mellitus ([place])   ? ? ?Assessment: ?12 YOF presenting with mechanical fall and femur fx, hx of mechanical AVR on warfarin PTA.  INR on admission slightly below mAVR goal of 2.5-3.5 and warfarin on hold for surgery ? ?HL came back therapeutic this AM after rate increase. Plan for surgery next week. We will check a confirm level in 6 hr.  ?Goal of Therapy:  ?Heparin level 0.3-0.7 units/ml ?Monitor platelets by anticoagulation protocol: Yes ?  ?Plan:  ?Heparin 1150 units/hr ?F/u 6 hour confirm heparin level ?F/u intervention plans and ability to transition back to  warfarin ? ?Onnie Boer, PharmD, BCIDP, AAHIVP, CPP ?Infectious Disease Pharmacist ?09/05/2021 10:21 AM ? ? ? ? ?

## 2021-09-05 NOTE — Progress Notes (Signed)
ANTICOAGULATION CONSULT NOTE - Follow Up Consult ? ?Pharmacy Consult for heparin ?Indication: mAVR ? ?Allergies  ?Allergen Reactions  ? Atorvastatin   ?  Muscle pain  ? Dapagliflozin   ?  Other reaction(s): nausea  ? Gabapentin   ?  Other reaction(s): stomach issues  ? Simvastatin Other (See Comments)  ?  Muscle pain  ? Sulfa Antibiotics   ?  unknown  ? Iodinated Contrast Media Rash  ?   Red rash after cardiac cath 1 wk ago, ? Contrast allergy, requires 13 hr prep now per dr.gallerani//a.calhoun ?   ? ? ?Patient Measurements: ?Height: '5\' 2"'$  (157.5 cm) ?Weight: 76.7 kg (169 lb) ?IBW/kg (Calculated) : 50.1 ?Heparin Dosing Weight: 66.8kg ? ?Vital Signs: ?Temp: 99.6 ?F (37.6 ?C) (04/15 0749) ?Temp Source: Oral (04/15 0749) ?BP: 108/49 (04/15 0749) ?Pulse Rate: 40 (04/15 0749) ? ?Labs: ?Recent Labs  ?  09/04/21 ?1122 09/04/21 ?1236 09/04/21 ?2347 09/05/21 ?7341 09/05/21 ?1532  ?HGB 9.9*  --   --  9.4*  --   ?HCT 31.6*  --   --  30.5*  --   ?PLT 139*  --   --  113*  --   ?LABPROT  --  25.0*  --   --   --   ?INR  --  2.3*  --   --   --   ?HEPARINUNFRC  --   --  0.19* 0.33 0.34  ?CREATININE 1.69*  --   --   --   --   ? ? ? ?Estimated Creatinine Clearance: 27.1 mL/min (A) (by C-G formula based on SCr of 1.69 mg/dL (H)). ? ? ?Medical History: ?Past Medical History:  ?Diagnosis Date  ? Anemia   ? Anxiety   ? Aortic stenosis   ? a. Now has St Jude mechanical aortic valve (6/00). b. Echo (6/15) with EF 60-65%, mechanical aortic valve with mean gradient 27 mmHg.  ? Arthritis   ? a. Possible c-spine arthritis with pain down left arm.    ? Carotid artery disease ([place])   ? a. Carotid dopplers (7/16) with 40-59% BICA stenosis.    ? Chronic angle-closure glaucoma(365.23)   ? Chronic diastolic CHF (congestive heart failure) ([place])   ? a.  RHC (7/15) with mean RA 2, PA 22/11, mean PCWP 9, CI 3.79.  ? Contrast media allergy   ? Coronary atherosclerosis of native coronary artery   ? a. CABG at time of AVR in 6/00 with RIMA-RCA. b. Abnl nuc  11/2013 -> LHC (7/15) with patent RIMA-RCA, 80% mRCA, 80% pLAD with FFR 0.73, treated with DES to pLAD.  ? Depression   ? Essential hypertension   ? GERD (gastroesophageal reflux disease)   ? Hyperlipidemia   ? Low back pain   ? Neuromuscular disorder ([place])   ? neuropathy  ? Peripheral vascular disease ([place])   ? a, H/o Right SFA stent. b. Peripheral arterial dopplers (7/16) with right SFA stent patent.    ? PONV (postoperative nausea and vomiting)   ? N&V  ? Type II diabetes mellitus ([place])   ? ? ?Assessment: ?50 YOF presenting with mechanical fall and femur fx, hx of mechanical AVR on warfarin PTA.  INR on admission slightly below mAVR goal of 2.5-3.5 and warfarin on hold for surgery ? ?Repeat HL this afternoon remains therapeutic at 0.34. RN reports no s/s of bleeding  ? ?Goal of Therapy:  ?Heparin level 0.3-0.7 units/ml ?Monitor platelets by anticoagulation protocol: Yes ?  ?Plan:  ?Cotinue heparin 1150 units/hr ?  Monitor daily HL and CBC  ?F/u intervention plans and ability to transition back to warfarin ? ?Albertina Parr, PharmD., BCCCP ?Clinical Pharmacist ?Please refer to AMION for unit-specific pharmacist  ? ? ? ? ? ?

## 2021-09-05 NOTE — Progress Notes (Signed)
?PROGRESS NOTE ? ? ? ?Abigail Oliver  NFA:[id number] DOB: [birth year] DOA: 09/04/2021 ?PCP: [name], PA-C  ? ? ?Brief Narrative:  ?76 year old with history of coronary artery disease status post CABG, AVR on Coumadin, chronic diastolic dysfunction, hypertension hyperlipidemia, type 2 diabetes on metformin presented with ground-level fall and found to have left intercondylar fracture of femur with intra-articular extension.  Admitted for surgical intervention. ? ? ?Assessment & Plan: ?  ?Close traumatic intercondylar and intra-articular left distal femur fracture: ?Adequate pain medications.  Itching with Dilaudid, changed to fentanyl.  Use oral opiates as possible. ?Nonweightbearing until surgery. ?Surgery delayed because of Coumadin, anticipate 4/17. ? ?History of AVR on Coumadin, holding Coumadin per surgery.  Currently bridged with heparin. ? ?Type 2 diabetes, well controlled on metformin and Jardiance: Hold.  Bridged with sliding scale insulin. ? ?Essential hypertension: On Toprol-XL.  Continue. ? ?Coronary artery disease status post CABG: Also on aspirin.  Continue. ? ?Anxiety/depression: On BuSpar, Klonopin, sertraline and trazodone that is continued. ? ? ?DVT prophylaxis:   Heparin infusion ? ? ?Code Status: Full code ?Family Communication: Niece at the bedside ?Disposition Plan: Status is: Inpatient ?Remains inpatient appropriate because: Inpatient surgery needed ?  ? ? ?Consultants:  ?Orthopedics ? ?Procedures:  ?None ? ?Antimicrobials:  ?None ? ? ?Subjective: ?Patient seen and examined.  Episodic excruciating pain on the left knee.  Had itching with Dilaudid.  Niece at the bedside.  She had some nausea that was probably secondary to pain but improved now.  Denies any other complaints.  Aware about delayed surgery. ? ?Objective: ?Vitals:  ? 09/04/21 1633 09/04/21 1703 09/05/21 0442 09/05/21 0749  ?BP:  (!) 148/110 (!) 130/46 (!) 108/49  ?Pulse:  96 60 (!) 40  ?Resp:  '17 20 18  '$ ?Temp: 98 ?F (36.7 ?C)  98.8 ?F (37.1 ?C) 99.1 ?F (37.3 ?C) 99.6 ?F (37.6 ?C)  ?TempSrc: Oral Oral Oral Oral  ?SpO2:  98% 98% 96%  ?Weight:      ?Height:      ? ? ?Intake/Output Summary (Last 24 hours) at 09/05/2021 1122 ?Last data filed at 09/05/2021 0300 ?Gross per 24 hour  ?Intake 116.24 ml  ?Output --  ?Net 116.24 ml  ? ?Filed Weights  ? 09/04/21 1115  ?Weight: 76.7 kg  ? ? ?Examination: ? ?General exam: Appears calm and comfortable on room air. ?Respiratory system: Clear to auscultation.  ?Cardiovascular system: S1 & S2 heard, RRR.  Mechanical aortic sound. ?Gastrointestinal system: Abdomen is nondistended, soft and nontender. No organomegaly or masses felt. Normal bowel sounds heard. ?Central nervous system: Alert and oriented. No focal neurological deficits. ?Extremities:  ?Left knee with obvious swelling on knee immobilizer. ? ? ? ?Data Reviewed: I have personally reviewed following labs and imaging studies ? ?CBC: ?Recent Labs  ?Lab 09/04/21 ?1122 09/05/21 ?0917  ?WBC 5.5 6.6  ?HGB 9.9* 9.4*  ?HCT 31.6* 30.5*  ?MCV 89.3 88.2  ?PLT 139* 113*  ? ?Basic Metabolic Panel: ?Recent Labs  ?Lab 09/04/21 ?1122  ?NA 140  ?K 3.8  ?CL 107  ?CO2 26  ?GLUCOSE 134*  ?BUN 33*  ?CREATININE 1.69*  ?CALCIUM 9.3  ? ?GFR: ?Estimated Creatinine Clearance: 27.1 mL/min (A) (by C-G formula based on SCr of 1.69 mg/dL (H)). ?Liver Function Tests: ?No results for input(s): AST, ALT, ALKPHOS, BILITOT, PROT, ALBUMIN in the last 168 hours. ?No results for input(s): LIPASE, AMYLASE in the last 168 hours. ?No results for input(s): AMMONIA in the last 168 hours. ?Coagulation Profile: ?Recent Labs  ?  Lab 09/04/21 ?1236  ?INR 2.3*  ? ?Cardiac Enzymes: ?No results for input(s): CKTOTAL, CKMB, CKMBINDEX, TROPONINI in the last 168 hours. ?BNP (last 3 results) ?No results for input(s): PROBNP in the last 8760 hours. ?HbA1C: ?Recent Labs  ?  09/04/21 ?1146  ?HGBA1C 5.5  ? ?CBG: ?Recent Labs  ?Lab 09/04/21 ?1700 09/04/21 ?2220 09/05/21 ?0439 09/05/21 ?0752  ?GLUCAP 103* 132*  112* 124*  ? ?Lipid Profile: ?No results for input(s): CHOL, HDL, LDLCALC, TRIG, CHOLHDL, LDLDIRECT in the last 72 hours. ?Thyroid Function Tests: ?No results for input(s): TSH, T4TOTAL, FREET4, T3FREE, THYROIDAB in the last 72 hours. ?Anemia Panel: ?No results for input(s): VITAMINB12, FOLATE, FERRITIN, TIBC, IRON, RETICCTPCT in the last 72 hours. ?Sepsis Labs: ?No results for input(s): PROCALCITON, LATICACIDVEN in the last 168 hours. ? ?No results found for this or any previous visit (from the past 240 hour(s)).  ? ? ? ? ? ?Radiology Studies: ?CT Head Wo Contrast ? ?Result Date: 09/04/2021 ?CLINICAL DATA:  Head trauma, moderate-severe; Facial trauma, blunt; Neck trauma (Age >= 65y) EXAM: CT HEAD WITHOUT CONTRAST CT MAXILLOFACIAL WITHOUT CONTRAST CT CERVICAL SPINE WITHOUT CONTRAST TECHNIQUE: Multidetector CT imaging of the head, cervical spine, and maxillofacial structures were performed using the standard protocol without intravenous contrast. Multiplanar CT image reconstructions of the cervical spine and maxillofacial structures were also generated. RADIATION DOSE REDUCTION: This exam was performed according to the departmental dose-optimization program which includes automated exposure control, adjustment of the mA and/or kV according to patient size and/or use of iterative reconstruction technique. COMPARISON:  CT cervical spine July 02, 2011. MRI head February 24, 2015 FINDINGS: CT HEAD FINDINGS Brain: No evidence of acute infarction, hemorrhage, hydrocephalus, extra-axial collection or mass lesion/mass effect. Patchy white matter hypodensities, nonspecific but compatible with chronic microvascular ischemic disease. Cerebral atrophy. Vascular: Calcific intracranial atherosclerosis. No hyperdense vessel identified. Skull: No acute fracture. Other: No mastoid effusions. CT MAXILLOFACIAL FINDINGS Osseous: No fracture or mandibular dislocation. No destructive process. Orbits: Negative. No traumatic or inflammatory  finding. Sinuses: Clear sinuses. Soft tissues: Negative. CT CERVICAL SPINE FINDINGS Alignment: Trace retrolisthesis of C4 on C5, probably degenerative given degenerative change at this level. Otherwise, no substantial sagittal subluxation. Skull base and vertebrae: No evidence of acute fracture. Vertebral body heights are maintained. Partially imaged sclerotic lesion involving the T2 vertebral body, probably benign bone island given similar appearance on remote CT of the chest from 12/24/2013. Soft tissues and spinal canal: No prevertebral fluid or swelling. No visible canal hematoma. Disc levels:  Mild for age multilevel degenerative change. Upper chest: Visualized lung apices are clear. IMPRESSION: 1. No evidence of acute intracranial abnormality. 2. No evidence of acute facial fracture. 3. No evidence of acute fracture or traumatic malalignment the cervical spine. Electronically Signed   By: Margaretha Sheffield M.D.   On: 09/04/2021 12:17  ? ?CT Cervical Spine Wo Contrast ? ?Result Date: 09/04/2021 ?CLINICAL DATA:  Head trauma, moderate-severe; Facial trauma, blunt; Neck trauma (Age >= 65y) EXAM: CT HEAD WITHOUT CONTRAST CT MAXILLOFACIAL WITHOUT CONTRAST CT CERVICAL SPINE WITHOUT CONTRAST TECHNIQUE: Multidetector CT imaging of the head, cervical spine, and maxillofacial structures were performed using the standard protocol without intravenous contrast. Multiplanar CT image reconstructions of the cervical spine and maxillofacial structures were also generated. RADIATION DOSE REDUCTION: This exam was performed according to the departmental dose-optimization program which includes automated exposure control, adjustment of the mA and/or kV according to patient size and/or use of iterative reconstruction technique. COMPARISON:  CT cervical spine July 02, 2011.  MRI head February 24, 2015 FINDINGS: CT HEAD FINDINGS Brain: No evidence of acute infarction, hemorrhage, hydrocephalus, extra-axial collection or mass  lesion/mass effect. Patchy white matter hypodensities, nonspecific but compatible with chronic microvascular ischemic disease. Cerebral atrophy. Vascular: Calcific intracranial atherosclerosis. No hyperdense

## 2021-09-05 NOTE — Progress Notes (Signed)
During shift change patient was complaining of a burning sensation in her chest and throat. Moments later she complained about a heavy feeling to her chest. Vital signs were taken, blood glucose was checked, an EKG was performed, and NP Hollace Hayward) was notified. New orders were made. ?Patient is currently stable, alert and oriented. Will continue to monitor patient. ?

## 2021-09-05 NOTE — Progress Notes (Signed)
ANTICOAGULATION CONSULT NOTE - Follow Up Consult ? ?Pharmacy Consult for heparin ?Indication:  AVR ? ?Labs: ?Recent Labs  ?  09/04/21 ?1122 09/04/21 ?1236 09/04/21 ?2347  ?HGB 9.9*  --   --   ?HCT 31.6*  --   --   ?PLT 139*  --   --   ?LABPROT  --  25.0*  --   ?INR  --  2.3*  --   ?HEPARINUNFRC  --   --  0.19*  ?CREATININE 1.69*  --   --   ? ? ?Assessment: ?76yo female subtherapeutic on heparin with initial dosing while Coumadin on hold; no infusion issues or signs of bleeding per RN. ? ?Goal of Therapy:  ?Heparin level 0.3-0.7 units/ml ?  ?Plan:  ?Will increase heparin infusion by 3 units/kg/hr to 1150 units/hr and check level in 8 hours.   ? ?Wynona Neat, PharmD, BCPS  ?09/05/2021,12:57 AM ? ? ?

## 2021-09-05 NOTE — Progress Notes (Signed)
Subjective: ?Pleasant 76 year old s/p mechanical fall at home due to neuropathy and landed on hardwood floor. She suffered a bicondylar distal femur fracture with intercondylar extension. Due to the severity of the fracture pattern it has been decided to proceed with surgical management. She is currently on Warfarin at home so that will need to be held prior to surgical management. Dr. Verlon Setting or Dr. Marcelino Scot will be doing the surgery for this patient sometime next week.  ? ?Objective: ?Vital signs in last 24 hours: ?Temp:  [97.2 ?F (36.2 ?C)-99.6 ?F (37.6 ?C)] 99.6 ?F (37.6 ?C) (04/15 0749) ?Pulse Rate:  [40-96] 40 (04/15 0749) ?Resp:  [12-20] 18 (04/15 0749) ?BP: (102-148)/(46-110) 108/49 (04/15 0749) ?SpO2:  [94 %-100 %] 96 % (04/15 0749) ?Weight:  [76.7 kg] 76.7 kg (04/14 1115) ? ?Intake/Output from previous day: ?04/14 0701 - 04/15 0700 ?In: 116.2 [I.V.:116.2] ?Out: -  ?Intake/Output this shift: ?No intake/output data recorded. ? ?Recent Labs  ?  09/04/21 ?1122  ?HGB 9.9*  ? ?Recent Labs  ?  09/04/21 ?1122  ?WBC 5.5  ?RBC 3.54*  ?HCT 31.6*  ?PLT 139*  ? ?Recent Labs  ?  09/04/21 ?1122  ?NA 140  ?K 3.8  ?CL 107  ?CO2 26  ?BUN 33*  ?CREATININE 1.69*  ?GLUCOSE 134*  ?CALCIUM 9.3  ? ?Recent Labs  ?  09/04/21 ?1236  ?INR 2.3*  ? ? ?Neurologically intact ?Neurovascular intact ?Sensation intact distally ?Intact pulses distally ?Dorsiflexion/Plantar flexion intact ?Compartment soft ? ? ? ?Assessment/Plan: ?Patient is currently in a knee immobilizer and needs to remain in this especially if she gets up and moves around.  Nonweightbearing on left lower extremity.  It looks like her warfarin was stopped yesterday.  She will meet with either Dr. Doreatha Martin or Dr. Marcelino Scot for surgical intervention this upcoming week.  All questions encouraged and answered for the patient. ?Continue current pain medication regiment, but dilaudid made patient itchy so was given Benadryl. Recommend trying oxycodone 5-10 mg instead ? ? ?Drue Novel,  PA-C ?[facility] ?[phone] ?09/05/2021, 8:09 AM  ? ? ? ? ?

## 2021-09-05 NOTE — Plan of Care (Signed)

## 2021-09-06 ENCOUNTER — Inpatient Hospital Stay (HOSPITAL_COMMUNITY): Payer: Medicare Other

## 2021-09-06 DIAGNOSIS — S72432A Displaced fracture of medial condyle of left femur, initial encounter for closed fracture: Secondary | ICD-10-CM | POA: Diagnosis not present

## 2021-09-06 DIAGNOSIS — E785 Hyperlipidemia, unspecified: Secondary | ICD-10-CM | POA: Diagnosis not present

## 2021-09-06 DIAGNOSIS — S72422A Displaced fracture of lateral condyle of left femur, initial encounter for closed fracture: Secondary | ICD-10-CM | POA: Diagnosis not present

## 2021-09-06 DIAGNOSIS — I214 Non-ST elevation (NSTEMI) myocardial infarction: Secondary | ICD-10-CM | POA: Diagnosis not present

## 2021-09-06 DIAGNOSIS — I10 Essential (primary) hypertension: Secondary | ICD-10-CM | POA: Diagnosis not present

## 2021-09-06 DIAGNOSIS — I5032 Chronic diastolic (congestive) heart failure: Secondary | ICD-10-CM | POA: Diagnosis not present

## 2021-09-06 DIAGNOSIS — I2510 Atherosclerotic heart disease of native coronary artery without angina pectoris: Secondary | ICD-10-CM | POA: Diagnosis not present

## 2021-09-06 DIAGNOSIS — Z952 Presence of prosthetic heart valve: Secondary | ICD-10-CM | POA: Diagnosis not present

## 2021-09-06 LAB — GLUCOSE, CAPILLARY
Glucose-Capillary: 134 mg/dL — ABNORMAL HIGH (ref 70–99)
Glucose-Capillary: 173 mg/dL — ABNORMAL HIGH (ref 70–99)
Glucose-Capillary: 93 mg/dL (ref 70–99)
Glucose-Capillary: 225 mg/dL — ABNORMAL HIGH (ref 70–99)

## 2021-09-06 LAB — PROTIME-INR
INR: 1.9 — ABNORMAL HIGH (ref 0.8–1.2)
Prothrombin Time: 21.4 seconds — ABNORMAL HIGH (ref 11.4–15.2)

## 2021-09-06 LAB — BASIC METABOLIC PANEL
Anion gap: 7 (ref 5–15)
BUN: 26 mg/dL — ABNORMAL HIGH (ref 8–23)
CO2: 24 mmol/L (ref 22–32)
Calcium: 8.8 mg/dL — ABNORMAL LOW (ref 8.9–10.3)
Chloride: 105 mmol/L (ref 98–111)
Creatinine, Ser: 1.71 mg/dL — ABNORMAL HIGH (ref 0.44–1.00)
GFR, Estimated: 31 mL/min — ABNORMAL LOW (ref >60)
Glucose, Bld: 126 mg/dL — ABNORMAL HIGH (ref 70–99)
Potassium: 4 mmol/L (ref 3.5–5.1)
Sodium: 136 mmol/L (ref 135–145)

## 2021-09-06 LAB — ECHOCARDIOGRAM COMPLETE
AR max vel: 0.91 cm2
AV Area VTI: 1.03 cm2
AV Area mean vel: 0.87 cm2
AV Mean grad: 14.3 mmHg
AV Peak grad: 26.7 mmHg
Ao pk vel: 2.58 m/s
Area-P 1/2: 4.15 cm2
Height: 62 in
S' Lateral: 2.8 cm
Weight: 2744.29 oz

## 2021-09-06 LAB — CBC
HCT: 31.1 % — ABNORMAL LOW (ref 36.0–46.0)
Hemoglobin: 9.6 g/dL — ABNORMAL LOW (ref 12.0–15.0)
MCH: 27.2 pg (ref 26.0–34.0)
MCHC: 30.9 g/dL (ref 30.0–36.0)
MCV: 88.1 fL (ref 80.0–100.0)
Platelets: 124 10*3/uL — ABNORMAL LOW (ref 150–400)
RBC: 3.53 MIL/uL — ABNORMAL LOW (ref 3.87–5.11)
RDW: 17.3 % — ABNORMAL HIGH (ref 11.5–15.5)
WBC: 8.3 10*3/uL (ref 4.0–10.5)
nRBC: 0 % (ref 0.0–0.2)

## 2021-09-06 LAB — TROPONIN I (HIGH SENSITIVITY)
Troponin I (High Sensitivity): 1271 ng/L (ref <18)
Troponin I (High Sensitivity): 1473 ng/L (ref <18)
Troponin I (High Sensitivity): 1220 ng/L (ref <18)
Troponin I (High Sensitivity): 874 ng/L (ref <18)

## 2021-09-06 LAB — BRAIN NATRIURETIC PEPTIDE: B Natriuretic Peptide: 1282.7 pg/mL — ABNORMAL HIGH (ref 0.0–100.0)

## 2021-09-06 LAB — HEPARIN LEVEL (UNFRACTIONATED): Heparin Unfractionated: 0.38 IU/mL (ref 0.30–0.70)

## 2021-09-06 LAB — PHOSPHORUS: Phosphorus: 3.4 mg/dL (ref 2.5–4.6)

## 2021-09-06 LAB — MAGNESIUM: Magnesium: 1.9 mg/dL (ref 1.7–2.4)

## 2021-09-06 MED ORDER — DIPHENHYDRAMINE HCL 50 MG/ML IJ SOLN
50.0000 mg | Freq: Once | INTRAMUSCULAR | Status: AC
  Administered 2021-09-07: 50 mg via INTRAVENOUS
  Filled 2021-09-06: qty 1

## 2021-09-06 MED ORDER — PREDNISONE 20 MG PO TABS
50.0000 mg | ORAL_TABLET | Freq: Four times a day (QID) | ORAL | Status: AC
  Administered 2021-09-06 – 2021-09-07 (×3): 50 mg via ORAL
  Filled 2021-09-06 (×3): qty 1

## 2021-09-06 MED ORDER — SODIUM CHLORIDE 0.9 % WEIGHT BASED INFUSION
3.0000 mL/kg/h | INTRAVENOUS | Status: DC
  Administered 2021-09-07: 3 mL/kg/h via INTRAVENOUS

## 2021-09-06 MED ORDER — SODIUM CHLORIDE 0.9% FLUSH: 3.0000 mL | INTRAVENOUS | Status: DC | PRN

## 2021-09-06 MED ORDER — ASPIRIN 81 MG PO CHEW
81.0000 mg | CHEWABLE_TABLET | ORAL | Status: AC
  Administered 2021-09-07: 81 mg via ORAL
  Filled 2021-09-06: qty 1

## 2021-09-06 MED ORDER — SODIUM CHLORIDE 0.9 % WEIGHT BASED INFUSION
1.0000 mL/kg/h | INTRAVENOUS | Status: DC
  Administered 2021-09-07: 1 mL/kg/h via INTRAVENOUS

## 2021-09-06 MED ORDER — FUROSEMIDE 10 MG/ML IJ SOLN
20.0000 mg | Freq: Once | INTRAMUSCULAR | Status: AC
  Administered 2021-09-06: 20 mg via INTRAVENOUS
  Filled 2021-09-06: qty 2

## 2021-09-06 MED ORDER — SODIUM CHLORIDE 0.9 % IV SOLN: 250.0000 mL | INTRAVENOUS | Status: DC | PRN

## 2021-09-06 MED ORDER — SODIUM CHLORIDE 0.9% FLUSH
3.0000 mL | Freq: Two times a day (BID) | INTRAVENOUS | Status: DC
  Administered 2021-09-06 – 2021-09-12 (×7): 3 mL via INTRAVENOUS

## 2021-09-06 MED ORDER — DIPHENHYDRAMINE HCL 25 MG PO CAPS: 50.0000 mg | ORAL_CAPSULE | Freq: Once | ORAL | Status: AC

## 2021-09-06 NOTE — Progress Notes (Signed)
?PROGRESS NOTE ? ? ? ?DEEANNE DEININGER  GEX:[id number] DOB: Aug 06, 1945 DOA: 09/04/2021 ?PCP: [name], PA-C  ? ? ?Brief Narrative:  ?76 year old with history of coronary artery disease status post CABG, LAD stent 2015 , AVR on Coumadin, chronic diastolic dysfunction, hypertension, hyperlipidemia, type 2 diabetes on metformin presented with ground-level fall and found to have left intercondylar fracture of femur with intra-articular extension.  Admitted for surgical intervention. ?4/16, suffered from non-STEMI with nonspecific symptoms of chest discomfort, troponin elevated , no ongoing chest pain.  ? ? ?Assessment & Plan: ?  ?Close traumatic intercondylar and intra-articular left distal femur fracture: ?Adequate pain medications.  Itching with Dilaudid, changed to fentanyl.  Use oral opiates as possible. ?Nonweightbearing until surgery. Surgery delayed because of Coumadin and now with NSTEMI. ? ?Non-ST elevation MI: Patient started having nonspecific symptoms nausea, chest discomfort overnight.  Twelve-lead EKG with nonspecific lateral lead changes.  Troponins elevated.  Chest pain resolved. ?Already on aspirin and on heparin infusion. ?Seen by cardiology.  History of CABG and LAD stent. ?Possible cardiac cath tomorrow. ?Already on metoprolol, Crestor.  Mild allergy to contrast dye.  Prednisone prep. ? ?History of AVR on Coumadin, holding Coumadin for surgery.  Currently bridged with heparin. ? ?Type 2 diabetes, well controlled on metformin and Jardiance: Hold.  Bridged with sliding scale insulin. ? ?Essential hypertension: On Toprol-XL.  Continue. ? ?Anxiety/depression: On BuSpar, Klonopin, sertraline and trazodone that is continued. ? ? ?DVT prophylaxis:   Heparin infusion ? ? ?Code Status: Full code ?Family Communication: None today. ?Disposition Plan: Status is: Inpatient ?Remains inpatient appropriate because: Inpatient surgery needed ?  ? ? ?Consultants:  ?Orthopedics ?Cardiology ? ?Procedures:   ?None ? ?Antimicrobials:  ?None ? ? ?Subjective: ? ?Patient seen and examined.  Overnight events noted.  Patient tells me that the staff and doctors panicked but she did not have any issues.  Patient did have chest discomfort with significant elevated troponin.  She was moved to cardiac floor. ?On my evaluation, denies any complaints. ?Left knee pain is controlled at rest.  Hurts to move. ? ?Objective: ?Vitals:  ? 09/06/21 0348 09/06/21 0450 09/06/21 0842 09/06/21 1157  ?BP: (!) 118/39 (!) 130/51 (!) 108/49 (!) 126/42  ?Pulse: 82 72 71 64  ?Resp: '20 20  16  '$ ?Temp: 98.9 ?F (37.2 ?C) 99.1 ?F (37.3 ?C)  98.4 ?F (36.9 ?C)  ?TempSrc:  Oral  Oral  ?SpO2: 100% 100%  96%  ?Weight:  77.8 kg    ?Height:  '5\' 2"'$  (1.575 m)    ? ?No intake or output data in the 24 hours ending 09/06/21 1410 ? ?Filed Weights  ? 09/04/21 1115 09/06/21 0450  ?Weight: 76.7 kg 77.8 kg  ? ? ?Examination: ? ?General exam: Appears calm and comfortable on room air.  Slightly anxious appropriately. ?Respiratory system: Clear to auscultation.  ?Cardiovascular system: S1 & S2 heard, RRR.  Aortic ejection murmur present. ?Gastrointestinal system: Abdomen is nondistended, soft and nontender. No organomegaly or masses felt. Normal bowel sounds heard. ?Central nervous system: Alert and oriented. No focal neurological deficits. ?Extremities:  ?Left knee with obvious swelling on knee immobilizer. ? ? ? ?Data Reviewed: I have personally reviewed following labs and imaging studies ? ?CBC: ?Recent Labs  ?Lab 09/04/21 ?1122 09/05/21 ?0102 09/06/21 ?0156  ?WBC 5.5 6.6 8.3  ?HGB 9.9* 9.4* 9.6*  ?HCT 31.6* 30.5* 31.1*  ?MCV 89.3 88.2 88.1  ?PLT 139* 113* 124*  ? ?Basic Metabolic Panel: ?Recent Labs  ?Lab 09/04/21 ?1122 09/06/21 ?7253  ?  NA 140 136  ?K 3.8 4.0  ?CL 107 105  ?CO2 26 24  ?GLUCOSE 134* 126*  ?BUN 33* 26*  ?CREATININE 1.69* 1.71*  ?CALCIUM 9.3 8.8*  ?MG  --  1.9  ?PHOS  --  3.4  ? ?GFR: ?Estimated Creatinine Clearance: 27 mL/min (A) (by C-G formula based on  SCr of 1.71 mg/dL (H)). ?Liver Function Tests: ?No results for input(s): AST, ALT, ALKPHOS, BILITOT, PROT, ALBUMIN in the last 168 hours. ?No results for input(s): LIPASE, AMYLASE in the last 168 hours. ?No results for input(s): AMMONIA in the last 168 hours. ?Coagulation Profile: ?Recent Labs  ?Lab 09/04/21 ?1236 09/06/21 ?1020  ?INR 2.3* 1.9*  ? ?Cardiac Enzymes: ?No results for input(s): CKTOTAL, CKMB, CKMBINDEX, TROPONINI in the last 168 hours. ?BNP (last 3 results) ?No results for input(s): PROBNP in the last 8760 hours. ?HbA1C: ?Recent Labs  ?  09/04/21 ?1146  ?HGBA1C 5.5  ? ?CBG: ?Recent Labs  ?Lab 09/05/21 ?1150 09/05/21 ?1710 09/05/21 ?1942 09/06/21 ?0840 09/06/21 ?1118  ?GLUCAP 128* 130* 132* 134* 173*  ? ?Lipid Profile: ?No results for input(s): CHOL, HDL, LDLCALC, TRIG, CHOLHDL, LDLDIRECT in the last 72 hours. ?Thyroid Function Tests: ?No results for input(s): TSH, T4TOTAL, FREET4, T3FREE, THYROIDAB in the last 72 hours. ?Anemia Panel: ?No results for input(s): VITAMINB12, FOLATE, FERRITIN, TIBC, IRON, RETICCTPCT in the last 72 hours. ?Sepsis Labs: ?No results for input(s): PROCALCITON, LATICACIDVEN in the last 168 hours. ? ?No results found for this or any previous visit (from the past 240 hour(s)).  ? ? ? ? ? ?Radiology Studies: ?CT KNEE LEFT WO CONTRAST ? ?Result Date: 09/04/2021 ?CLINICAL DATA:  Knee fracture. Status post fall. Fell onto left knee EXAM: CT OF THE LEFT KNEE WITHOUT CONTRAST TECHNIQUE: Multidetector CT imaging of the left knee was performed according to the standard protocol. Multiplanar CT image reconstructions were also generated. RADIATION DOSE REDUCTION: This exam was performed according to the departmental dose-optimization program which includes automated exposure control, adjustment of the mA and/or kV according to patient size and/or use of iterative reconstruction technique. COMPARISON:  Left knee radiographs 09/04/2021 FINDINGS: Bones/Joint/Cartilage There is diffuse decreased  bone mineralization. There are curvilinear lucencies indicating nondisplaced but mildly comminuted fractures involving the lateral greater than medial femoral condyles. This includes curvilinear fracture line lucency extending from the medial distal femoral metaphysis distally towards the superior aspect of the intercondylar notch. Additional fracture line lucency extending from the lateral cortex at the distal femoral metaphysis-epiphysis junction (coronal series 7 images 58 through 69), extending to the superolateral aspect of the intercondylar notch. The fracture lines diffusely extend through the posterior distal femoral metaphyseal junction with the physis (sagittal series 8, images 91 through 110) with mild 2-3 mm cortical step-off. Ligaments Suboptimally assessed by CT. Muscles and Tendons Grossly normal density in size of the regional musculature. Soft tissues Moderate dense joint effusion with layering nondependent fat level (lipohemarthrosis). Mild knee soft tissue swelling. Moderate vascular calcifications are seen. IMPRESSION: Multiple nondisplaced acute fracture lines are seen within the lateral greater than medial femoral condyles extending through the distal lateral femoral cortex at the junction of the distal metaphysis and physis and extending through the distal medial femoral cortex at the level of the more superior metaphysis. Fracture lines also diffusely extend through the posterior metaphysis-physis junction of the distal femur. Electronically Signed   By: Yvonne Kendall M.D.   On: 09/04/2021 14:27   ? ? ? ? ? ?Scheduled Meds: ? [START ON 09/07/2021] aspirin  81 mg Oral Pre-Cath  ? aspirin EC  81 mg Oral Daily  ? busPIRone  15 mg Oral TID  ? dicyclomine  10 mg Oral TID AC  ? [START ON 09/07/2021] diphenhydrAMINE  50 mg Oral Once  ? Or  ? [START ON 09/07/2021] diphenhydrAMINE  50 mg Intravenous Once  ? docusate sodium  100 mg Oral BID  ? feeding supplement (GLUCERNA SHAKE)  237 mL Oral TID BM  ?  insulin aspart  0-15 Units Subcutaneous TID WC  ? insulin aspart  0-5 Units Subcutaneous QHS  ? latanoprost  1 drop Both Eyes QHS  ? metoprolol succinate  50 mg Oral Daily  ? multivitamin with minerals  1 tablet

## 2021-09-06 NOTE — H&P (View-Only) (Signed)
?Cardiology Consultation:  ? ?Patient ID: Abigail Oliver ?MRN: [id number]; DOB: 06/23/1945 ? ?Admit date: 09/04/2021 ?Date of Consult: 09/06/2021 ? ?PCP:  Saguier, Edward, PA-C ?  ?[facility] Providers ?Cardiologist:  Jonathan Berry, MD   { ? ?Patient Profile:  ? ?Abigail Oliver is a 76 y.o. female with a hx of AS s/p mechanical AVR, CAD s/p 1v CABG, DES x1 to LAD, HFpEF, HLD, Carotid stenosis, PAD s/p R SFA stent who is being seen 09/06/2021 for the evaluation of chest pain at the request of Dr. Ghimire. ? ?History of Present Illness:  ? ?Abigail Oliver is a 76 yo female with PMH noted above. She has been followed in the AHF with Dr. McLean as well as with Dr. Berry for PAD. She underwent single vessel CABG with RIMA-RCA, along with AVR with St Jude mechanical valve in 10/1998.  ? ?In 2015 her torsemide was stopped and she was started on Lasix due to the increased cost of torsemide.  She developed worsening dyspnea and lower extremity edema.  Was later admitted for community-acquired pneumonia as well as CHF.  Echo 10/2013 showed LVEF of 60 to 65% mechanical aortic valve with mean gradient of 27 mmHg.  Her Lasix was increased to 80 mg twice daily.  Underwent Lexiscan Myoview in 11/2013 that showed small area of inferior lateral ischemia.  Given exertional symptoms she underwent right and left heart catheterization shortly thereafter.  Right and left heart filling pressures were normal and there was an 80% stenosis of the proximal LAD that was hemodynamically significant by FFR and treated with PCI/DES x1.  ? ?Echo 01/2019 showed LVEF of 60 to 65%, normal functioning Saint Jude mechanical aortic valve with mean gradient of 13 mmHg.  Peripheral artery Dopplers 01/2019 showed 50 to 79% right popliteal stenosis and she was seen by Dr. Berry with decision made for medical management. ? ?Seen in the advanced heart failure clinic 04/2019 for routine follow-up.  Did report ongoing dyspnea with exertion.  She was continued on  aspirin, warfarin, torsemide 40 mg daily, statin. ? ?Last seen by Dr. Berry 01/2021 where she reported right hip and left knee pain which did not seem consistent with claudication.  Did note her last lower extremity arterial Doppler studies performed a year prior did show mild in-stent restenosis in the right SFA stent. Repeat dopplers showed 30-49% stenosis in the proximal CFA, stable 50-74% stenosis in the proximal SFA distal to ostium, 50-74% stenosis in the mid SFA and patent right SFA stent.  ? ?She presented to the ED on 4/14 after a mechanical fall onto her left knee.  She was found to have a bicondylar distal femur fracture of the left knee with effusion.  Labs on admission showed sodium 140, potassium 3.8, creatinine 1.6, WBC 5.5, hemoglobin 9.9, INR 2.3.  EKG showed sinus bradycardia, 53 bpm.  She was admitted to internal medicine and orthopedics consulted.  She was seen by Dr. Aluisio and given her fairly complex fracture pattern it was felt she would be best served by evaluation from the orthopedic trauma team by Dr. Haddix or Dr. Handy.  Her Coumadin has been held and placed on IV heparin in anticipation of surgical intervention. ? ?The morning of 4/16 she developed chest pressure, shortness of breath while they were moving her in the bed. Had significant knee pain and then developed chest tightness. Also noted to become briefly hypoxic requiring O2.  High-sensitivity troponins were cycled 44>> 292>> 1271>> 1473.  BNP 1282.    EKG showed sinus rhythm, 70 bpm, frequent PVCs, bigeminy, PAC, slight ST depression in lateral leads. Chest pain resolved, and no recurrence since. She was moved to cardiac progressive floor.  ? ?In talking with her, she denies any chest pain or shortness of breath PTA. Has been doing well from a cardiac standpoint. She thought this episode this morning was related to anxiety but reports it felt similar to what she experienced with prior LAD stent.  ? ?Past Medical History:   ?Diagnosis Date  ? Anemia   ? Anxiety   ? Aortic stenosis   ? a. Now has St Jude mechanical aortic valve (6/00). b. Echo (6/15) with EF 60-65%, mechanical aortic valve with mean gradient 27 mmHg.  ? Arthritis   ? a. Possible c-spine arthritis with pain down left arm.    ? Carotid artery disease (HCC)   ? a. Carotid dopplers (7/16) with 40-59% BICA stenosis.    ? Chronic angle-closure glaucoma(365.23)   ? Chronic diastolic CHF (congestive heart failure) (HCC)   ? a.  RHC (7/15) with mean RA 2, PA 22/11, mean PCWP 9, CI 3.79.  ? Contrast media allergy   ? Coronary atherosclerosis of native coronary artery   ? a. CABG at time of AVR in 6/00 with RIMA-RCA. b. Abnl nuc 11/2013 -> LHC (7/15) with patent RIMA-RCA, 80% mRCA, 80% pLAD with FFR 0.73, treated with DES to pLAD.  ? Depression   ? Essential hypertension   ? GERD (gastroesophageal reflux disease)   ? Hyperlipidemia   ? Low back pain   ? Neuromuscular disorder (HCC)   ? neuropathy  ? Peripheral vascular disease (HCC)   ? a, H/o Right SFA stent. b. Peripheral arterial dopplers (7/16) with right SFA stent patent.    ? PONV (postoperative nausea and vomiting)   ? N&V  ? Type II diabetes mellitus (HCC)   ? ? ?Past Surgical History:  ?Procedure Laterality Date  ? BILATERAL OOPHORECTOMY  05/24/1985  ? BREAST BIOPSY  05/24/2010  ? left  ? CARDIAC CATHETERIZATION  01/14/1999  ? normal LV function, severe aortic stenosis; 80% and 70% stenosis in RCA; mild 20% distal norrowing in L main with 20% proximal LAD stenosis, 40% diagonal stenosis and 20% proximal circumflex stenosis  ? CARDIAC VALVE REPLACEMENT  05/24/1998  ? aortic valve   ? CARDIOVASCULAR STRESS TEST  08/25/2011  ? R/L MV - EF 72%; no scintigraphic evidence of inducible MI; normal perfusionTID of 1.25 elevated - could indicate small vessle subendocardial ischemia; EKG NSR at 66, non diagnostic for ischemia  ? CARPAL TUNNEL RELEASE  05/25/1987  ? bilaterally  ? CATARACT EXTRACTION W/ INTRAOCULAR LENS  IMPLANT,  BILATERAL  ~ 2007  ? CESAREAN SECTION  1969; 1971  ? CHOLECYSTECTOMY  05/24/1988  ? COLONOSCOPY N/A 10/27/2012  ? Procedure: COLONOSCOPY;  Surgeon: Patrick D Hung, MD;  Location: [facility];  Service: Endoscopy;  Laterality: N/A;  ? CORONARY ARTERY BYPASS GRAFT  05/24/1998  ? RIMA to RCA  ? DOPPLER ECHOCARDIOGRAPHY  03/29/2012  ? EF >55%; mild concentric LVH; stage 1 diastolic dysfunction, elevated LV filling pressure, dilated LA; MAC mild MR; St Jude AVR peak and mean gradients of 33mmHg and 22mmHg; transvalvular gradients have increased (prev 23 and 14 respectively)  ? ESOPHAGOGASTRODUODENOSCOPY N/A 10/27/2012  ? Procedure: ESOPHAGOGASTRODUODENOSCOPY (EGD);  Surgeon: Patrick D Hung, MD;  Location: [facility];  Service: Endoscopy;  Laterality: N/A;  ? FEMORAL ARTERY STENT  09/20/2011  ? 6 x 40 Smart Nitinol self-expanding   stent placed;  10/15/2031 -R SFA stent open and patent w/o evidence of restenosis  ? FRACTIONAL FLOW RESERVE WIRE  12/14/2013  ? Procedure: FRACTIONAL FLOW RESERVE WIRE;  Surgeon: Larey Dresser, MD;  Location: [facility] CATH LAB;  Service: Cardiovascular;;  ? KNEE SURGERY Left   ? LACERATION REPAIR    ? right hand  ? LEFT AND RIGHT HEART CATHETERIZATION WITH CORONARY ANGIOGRAM N/A 12/14/2013  ? Procedure: LEFT AND RIGHT HEART CATHETERIZATION WITH CORONARY ANGIOGRAM;  Surgeon: Larey Dresser, MD;  Location: [facility] CATH LAB;  Service: Cardiovascular;  Laterality: N/A;  ? LOWER EXTREMITY ANGIOGRAM N/A 09/20/2011  ? Procedure: LOWER EXTREMITY ANGIOGRAM;  Surgeon: Lorretta Harp, MD;  Location: [facility] CATH LAB;  Service: Cardiovascular;  Laterality: N/A;  ? LYMPH NODE BIOPSY  06/25/2011  ? "core needle on 5"  ? needle biopsy  05/25/2007  ? "on ankles for nerve damage"  ? NEUROPLASTY / TRANSPOSITION ULNAR NERVE AT ELBOW    ? right  ? PERCUTANEOUS CORONARY STENT INTERVENTION (PCI-S)  12/14/2013  ? Procedure: PERCUTANEOUS CORONARY STENT INTERVENTION (PCI-S);  Surgeon: Larey Dresser, MD;  Location: [facility] CATH LAB;   Service: Cardiovascular;;  Prox LAD 3.00x12 Promus DES ?  ? PERIPHERAL ARTERIAL STENT GRAFT  09/20/2011  ? right SFA  ? REFRACTIVE SURGERY  ` 2004  ? "for glaucoma"  ? TONSILLECTOMY  05/24/1966  ? TRIGGER FINGE

## 2021-09-06 NOTE — Progress Notes (Signed)
*  PRELIMINARY RESULTS* ?Echocardiogram ?2D Echocardiogram has been performed. ? ?Abigail Oliver ?09/06/2021, 1:53 PM ?

## 2021-09-06 NOTE — Progress Notes (Signed)
ANTICOAGULATION CONSULT NOTE - Follow Up Consult ? ?Pharmacy Consult for heparin ?Indication: mAVR/ACS/Chest pain ? ?Allergies  ?Allergen Reactions  ? Atorvastatin   ?  Muscle pain  ? Dapagliflozin   ?  Other reaction(s): nausea  ? Gabapentin   ?  Other reaction(s): stomach issues  ? Simvastatin Other (See Comments)  ?  Muscle pain  ? Sulfa Antibiotics   ?  unknown  ? Iodinated Contrast Media Rash  ?   Red rash after cardiac cath 1 wk ago, ? Contrast allergy, requires 13 hr prep now per [email or web address] ?   ? ? ?Patient Measurements: ?Height: '5\' 2"'$  (157.5 cm) ?Weight: 77.8 kg (171 lb 8.3 oz) ?IBW/kg (Calculated) : 50.1 ?Heparin Dosing Weight: 66.8kg ? ?Vital Signs: ?Temp: 99.1 ?F (37.3 ?C) (04/16 0450) ?Temp Source: Oral (04/16 0450) ?BP: 130/51 (04/16 0450) ?Pulse Rate: 72 (04/16 0450) ? ?Labs: ?Recent Labs  ?  09/04/21 ?1122 09/04/21 ?1236 09/04/21 ?2347 09/05/21 ?1696 09/05/21 ?1532 09/05/21 ?1951 09/05/21 ?2125 09/06/21 ?0156 09/06/21 ?7893  ?HGB 9.9*  --   --  9.4*  --   --   --  9.6*  --   ?HCT 31.6*  --   --  30.5*  --   --   --  31.1*  --   ?PLT 139*  --   --  113*  --   --   --  124*  --   ?LABPROT  --  25.0*  --   --   --   --   --   --   --   ?INR  --  2.3*  --   --   --   --   --   --   --   ?HEPARINUNFRC  --   --    < > 0.33 0.34  --   --  0.38  --   ?CREATININE 1.69*  --   --   --   --   --   --   --  1.71*  ?TROPONINIHS  --   --   --   --   --    < > 292* 1,271* 1,473*  ? < > = values in this interval not displayed.  ? ? ? ?Estimated Creatinine Clearance: 27 mL/min (A) (by C-G formula based on SCr of 1.71 mg/dL (H)). ? ? ?Medical History: ?Past Medical History:  ?Diagnosis Date  ? Anemia   ? Anxiety   ? Aortic stenosis   ? a. Now has St Jude mechanical aortic valve (6/00). b. Echo (6/15) with EF 60-65%, mechanical aortic valve with mean gradient 27 mmHg.  ? Arthritis   ? a. Possible c-spine arthritis with pain down left arm.    ? Carotid artery disease ([place])   ? a. Carotid dopplers (7/16) with  40-59% BICA stenosis.    ? Chronic angle-closure glaucoma(365.23)   ? Chronic diastolic CHF (congestive heart failure) ([place])   ? a.  RHC (7/15) with mean RA 2, PA 22/11, mean PCWP 9, CI 3.79.  ? Contrast media allergy   ? Coronary atherosclerosis of native coronary artery   ? a. CABG at time of AVR in 6/00 with RIMA-RCA. b. Abnl nuc 11/2013 -> LHC (7/15) with patent RIMA-RCA, 80% mRCA, 80% pLAD with FFR 0.73, treated with DES to pLAD.  ? Depression   ? Essential hypertension   ? GERD (gastroesophageal reflux disease)   ? Hyperlipidemia   ? Low back pain   ? Neuromuscular disorder ([place])   ?  neuropathy  ? Peripheral vascular disease ([place])   ? a, H/o Right SFA stent. b. Peripheral arterial dopplers (7/16) with right SFA stent patent.    ? PONV (postoperative nausea and vomiting)   ? N&V  ? Type II diabetes mellitus ([place])   ? ? ?Assessment: ?14 YOF presenting with mechanical fall and femur fx, hx of mechanical AVR on warfarin PTA.  INR on admission slightly below mAVR goal of 2.5-3.5 and warfarin on hold for surgery. Patient developed chest pain overnight, troponins cycled [phone]). Patient was continued on heparin gtt for ACS r/o.  ? ?Warfarin PTA regimen: 1.5 mg Tues/Thurs, 3 mg all other days ? ?Heparin level 0.38 and therapeutic this AM on 1150 units/h. Hgb and platelets remain stable this AM. No signs of bleeding noted on chart review.  ? ?Goal of Therapy:  ?Heparin level 0.3-0.7 units/ml ?Monitor platelets by anticoagulation protocol: Yes ?  ?Plan:  ?Cotinue heparin 1150 units/hr ?Monitor daily HL and CBC  ?F/u intervention plans and ability to transition back to warfarin ? ?Thank you for involving pharmacy in this patient's care. ? ?Elita Quick, PharmD ?PGY1 Ambulatory Care Pharmacy Resident ?09/06/2021 8:22 AM ? ?**Pharmacist phone directory can be found on [email or web address] listed under [place]** ? ?

## 2021-09-06 NOTE — Progress Notes (Signed)
CSW acknowledges consult for SNF/HH. The patient will require PT/OT evaluations. TOC will assist with disposition planning once the evaluations have been completed.  °  °TOC will continue to follow.    °

## 2021-09-06 NOTE — Consult Note (Addendum)
?Cardiology Consultation:  ? ?Patient ID: KWANZA CANCELLIERE ?MRN: [id number]; DOB: [birth year] ? ?Admit date: 09/04/2021 ?Date of Consult: 09/06/2021 ? ?PCP:  [name], PA-C ?  ?[facility] Providers ?Cardiologist:  Quay Burow, MD   { ? ?Patient Profile:  ? ?MI BALLA is a 76 y.o. female with a hx of AS s/p mechanical AVR, CAD s/p 1v CABG, DES x1 to LAD, HFpEF, HLD, Carotid stenosis, PAD s/p R SFA stent who is being seen 09/06/2021 for the evaluation of chest pain at the request of Dr. Sloan Leiter. ? ?History of Present Illness:  ? ?Ms. Fern is a 76 yo female with PMH noted above. She has been followed in the AHF with Dr. [name] as well as with Dr. Gwenlyn Found for PAD. She underwent single vessel CABG with RIMA-RCA, along with AVR with St Jude mechanical valve in 10/1998.  ? ?In 2015 her torsemide was stopped and she was started on Lasix due to the increased cost of torsemide.  She developed worsening dyspnea and lower extremity edema.  Was later admitted for community-acquired pneumonia as well as CHF.  Echo 10/2013 showed LVEF of 60 to 65% mechanical aortic valve with mean gradient of 27 mmHg.  Her Lasix was increased to 80 mg twice daily.  Underwent Lexiscan Myoview in 11/2013 that showed small area of inferior lateral ischemia.  Given exertional symptoms she underwent right and left heart catheterization shortly thereafter.  Right and left heart filling pressures were normal and there was an 80% stenosis of the proximal LAD that was hemodynamically significant by FFR and treated with PCI/DES x1.  ? ?Echo 01/2019 showed LVEF of 60 to 65%, normal functioning Saint Jude mechanical aortic valve with mean gradient of 13 mmHg.  Peripheral artery Dopplers 01/2019 showed 50 to 79% right popliteal stenosis and she was seen by Dr. Gwenlyn Found with decision made for medical management. ? ?Seen in the advanced heart failure clinic 04/2019 for routine follow-up.  Did report ongoing dyspnea with exertion.  She was continued on  aspirin, warfarin, torsemide 40 mg daily, statin. ? ?Last seen by Dr. Gwenlyn Found 01/2021 where she reported right hip and left knee pain which did not seem consistent with claudication.  Did note her last lower extremity arterial Doppler studies performed a year prior did show mild in-stent restenosis in the right SFA stent. Repeat dopplers showed 30-49% stenosis in the proximal CFA, stable 50-74% stenosis in the proximal SFA distal to ostium, 50-74% stenosis in the mid SFA and patent right SFA stent.  ? ?She presented to the ED on 4/14 after a mechanical fall onto her left knee.  She was found to have a bicondylar distal femur fracture of the left knee with effusion.  Labs on admission showed sodium 140, potassium 3.8, creatinine 1.6, WBC 5.5, hemoglobin 9.9, INR 2.3.  EKG showed sinus bradycardia, 53 bpm.  She was admitted to internal medicine and orthopedics consulted.  She was seen by Dr. Wynelle Link and given her fairly complex fracture pattern it was felt she would be best served by evaluation from the orthopedic trauma team by Dr. Doreatha Martin or Dr. Marcelino Scot.  Her Coumadin has been held and placed on IV heparin in anticipation of surgical intervention. ? ?The morning of 4/16 she developed chest pressure, shortness of breath while they were moving her in the bed. Had significant knee pain and then developed chest tightness. Also noted to become briefly hypoxic requiring O2.  High-sensitivity troponins were cycled 44>> 292>> 1271>> 1473.  BNP 1282.  EKG showed sinus rhythm, 70 bpm, frequent PVCs, bigeminy, PAC, slight ST depression in lateral leads. Chest pain resolved, and no recurrence since. She was moved to cardiac progressive floor.  ? ?In talking with her, she denies any chest pain or shortness of breath PTA. Has been doing well from a cardiac standpoint. She thought this episode this morning was related to anxiety but reports it felt similar to what she experienced with prior LAD stent.  ? ?Past Medical History:   ?Diagnosis Date  ? Anemia   ? Anxiety   ? Aortic stenosis   ? a. Now has St Jude mechanical aortic valve (6/00). b. Echo (6/15) with EF 60-65%, mechanical aortic valve with mean gradient 27 mmHg.  ? Arthritis   ? a. Possible c-spine arthritis with pain down left arm.    ? Carotid artery disease ([place])   ? a. Carotid dopplers (7/16) with 40-59% BICA stenosis.    ? Chronic angle-closure glaucoma(365.23)   ? Chronic diastolic CHF (congestive heart failure) ([place])   ? a.  RHC (7/15) with mean RA 2, PA 22/11, mean PCWP 9, CI 3.79.  ? Contrast media allergy   ? Coronary atherosclerosis of native coronary artery   ? a. CABG at time of AVR in 6/00 with RIMA-RCA. b. Abnl nuc 11/2013 -> LHC (7/15) with patent RIMA-RCA, 80% mRCA, 80% pLAD with FFR 0.73, treated with DES to pLAD.  ? Depression   ? Essential hypertension   ? GERD (gastroesophageal reflux disease)   ? Hyperlipidemia   ? Low back pain   ? Neuromuscular disorder ([place])   ? neuropathy  ? Peripheral vascular disease ([place])   ? a, H/o Right SFA stent. b. Peripheral arterial dopplers (7/16) with right SFA stent patent.    ? PONV (postoperative nausea and vomiting)   ? N&V  ? Type II diabetes mellitus ([place])   ? ? ?Past Surgical History:  ?Procedure Laterality Date  ? BILATERAL OOPHORECTOMY  05/24/1985  ? BREAST BIOPSY  05/24/2010  ? left  ? CARDIAC CATHETERIZATION  01/14/1999  ? normal LV function, severe aortic stenosis; 80% and 70% stenosis in RCA; mild 20% distal norrowing in L main with 20% proximal LAD stenosis, 40% diagonal stenosis and 20% proximal circumflex stenosis  ? CARDIAC VALVE REPLACEMENT  05/24/1998  ? aortic valve   ? CARDIOVASCULAR STRESS TEST  08/25/2011  ? R/L MV - EF 72%; no scintigraphic evidence of inducible MI; normal perfusionTID of 1.25 elevated - could indicate small vessle subendocardial ischemia; EKG NSR at 66, non diagnostic for ischemia  ? CARPAL TUNNEL RELEASE  05/25/1987  ? bilaterally  ? CATARACT EXTRACTION W/ INTRAOCULAR LENS  IMPLANT,  BILATERAL  ~ 2007  ? [place]; 1971  ? CHOLECYSTECTOMY  05/24/1988  ? COLONOSCOPY N/A 10/27/2012  ? Procedure: COLONOSCOPY;  Surgeon: Beryle Beams, MD;  Location: [facility] END[facility]COPY;  Service: Endoscopy;  Laterality: N/A;  ? CORONARY ARTERY BYPASS GRAFT  05/24/1998  ? RIMA to RCA  ? DOPPLER ECHOCARDIOGRAPHY  03/29/2012  ? EF >55%; mild concentric LVH; stage 1 diastolic dysfunction, elevated LV filling pressure, dilated LA; MAC mild MR; St Jude AVR peak and mean gradients of 3mHg and 283mg; transvalvular gradients have increased (prev 23 and 14 respectively)  ? ESOPHAGOGASTRODUODENOSCOPY N/A 10/27/2012  ? Procedure: ESOPHAGOGASTRODUODENOSCOPY (EGD);  Surgeon: PaBeryle BeamsMD;  [facility];  Service: Endoscopy;  Laterality: N/A;  ? FEMORAL ARTERY STENT  09/20/2011  ? 6 x 40 Smart Nitinol self-expanding  stent placed;  10/15/2031 -R SFA stent open and patent w/o evidence of restenosis  ? FRACTIONAL FLOW RESERVE WIRE  12/14/2013  ? Procedure: FRACTIONAL FLOW RESERVE WIRE;  Surgeon: Larey Dresser, MD;  Location[facility] CATH LAB;  Service: Cardiovascular;;  ? KNEE SURGERY Left   ? LACERATION REPAIR    ? right hand  ? LEFT AND RIGHT HEART CATHETERIZATION WITH CORONARY ANGIOGRAM N/A 12/14/2013  ? Procedure: LEFT AND RIGHT HEART CATHETERIZATION WITH CORONARY ANGIOGRAM;  Surgeon: Larey Dresser, MD;  Location: [facility] CATH LAB;  Service: Cardiovascular;  Laterality: N/A;  ? LOWER EXTREMITY ANGIOGRAM N/A 09/20/2011  ? Procedure: LOWER EXTREMITY ANGIOGRAM;  Surgeon: Lorretta Harp, MD;  Location: [facility] CATH LAB;  Service: Cardiovascular;  Laterality: N/A;  ? LYMPH NODE BIOPSY  06/25/2011  ? "core needle on 5"  ? needle biopsy  05/25/2007  ? "on ankles for nerve damage"  ? NEUROPLASTY / TRANSPOSITION ULNAR NERVE AT ELBOW    ? right  ? PERCUTANEOUS CORONARY STENT INTERVENTION (PCI-S)  12/14/2013  ? Procedure: PERCUTANEOUS CORONARY STENT INTERVENTION (PCI-S);  Surgeon: Larey Dresser, MD;  Location: [facility] CATH LAB;   Service: Cardiovascular;;  Prox LAD 3.00x12 Promus DES ?  ? PERIPHERAL ARTERIAL STENT GRAFT  09/20/2011  ? right SFA  ? REFRACTIVE SURGERY  ` 2004  ? "for glaucoma"  ? TONSILLECTOMY  05/24/1966  ? TRIGGER FINGE

## 2021-09-06 NOTE — Significant Event (Addendum)
Rapid Response Event Note  ? ?Reason for Call :  ?Pt with chest pressure/indigestion t/o night. Myaalox given with improvement. Pt also with episode of SOB/SpO2 drop to 70s on RA. This improved to 97% on 3L [place] with improvement in SOB as well. Trop cycled>44>292>1271. RRT called to help expedite tx of pt to cardiac PCU. ? ?Initial Focused Assessment:  ?Pt lying in bed with eyes open, in no distress. Pt alert and oriented, denies chest pain/pressure/SOB. Lungs diminished t/o. Skin warm and dry.  ? ?T-98.9, HR-82, BP-118/39, RR-20, SpO2-100% on 3L [place]. ? ?Pt already on heparin gtt for AVR(pt on coumadin at home) ? ?Interventions:  ?EKG-SR with PVCs/PACs ?Tx pt to cardiac PCU bed ? ?Plan of Care:  ?Tx pt to cardiac PCU bed(6E11). Continue to monitor pt closely. Call RRT if further assistance needed.  ? ?Event Summary:  ? ?MD Notified: Lennox Grumbles, NP notified PTA RRT ?Call [phone] ?Arrival [id number] ?End Time: 1561 ?Dillard Essex, RN ?

## 2021-09-07 ENCOUNTER — Encounter (HOSPITAL_COMMUNITY)
Admission: EM | Disposition: A | Discharge status: Home Health | Payer: Self-pay | Source: Home / Self Care | Attending: Internal Medicine

## 2021-09-07 ENCOUNTER — Encounter (HOSPITAL_COMMUNITY): Payer: Self-pay | Admitting: Internal Medicine

## 2021-09-07 DIAGNOSIS — E1143 Type 2 diabetes mellitus with diabetic autonomic (poly)neuropathy: Secondary | ICD-10-CM | POA: Diagnosis not present

## 2021-09-07 DIAGNOSIS — Z952 Presence of prosthetic heart valve: Secondary | ICD-10-CM | POA: Diagnosis not present

## 2021-09-07 DIAGNOSIS — S72432A Displaced fracture of medial condyle of left femur, initial encounter for closed fracture: Secondary | ICD-10-CM | POA: Diagnosis not present

## 2021-09-07 DIAGNOSIS — I2510 Atherosclerotic heart disease of native coronary artery without angina pectoris: Secondary | ICD-10-CM | POA: Diagnosis not present

## 2021-09-07 DIAGNOSIS — S72422A Displaced fracture of lateral condyle of left femur, initial encounter for closed fracture: Secondary | ICD-10-CM | POA: Diagnosis not present

## 2021-09-07 DIAGNOSIS — Z01810 Encounter for preprocedural cardiovascular examination: Secondary | ICD-10-CM

## 2021-09-07 DIAGNOSIS — I249 Acute ischemic heart disease, unspecified: Secondary | ICD-10-CM

## 2021-09-07 DIAGNOSIS — I10 Essential (primary) hypertension: Secondary | ICD-10-CM | POA: Diagnosis not present

## 2021-09-07 HISTORY — PX: CORONARY/GRAFT ANGIOGRAPHY: CATH118237

## 2021-09-07 LAB — CBC
HCT: 26.7 % — ABNORMAL LOW (ref 36.0–46.0)
Hemoglobin: 8.6 g/dL — ABNORMAL LOW (ref 12.0–15.0)
MCH: 27.6 pg (ref 26.0–34.0)
MCHC: 32.2 g/dL (ref 30.0–36.0)
MCV: 85.6 fL (ref 80.0–100.0)
Platelets: 98 10*3/uL — ABNORMAL LOW (ref 150–400)
RBC: 3.12 MIL/uL — ABNORMAL LOW (ref 3.87–5.11)
RDW: 16.8 % — ABNORMAL HIGH (ref 11.5–15.5)
WBC: 4 10*3/uL (ref 4.0–10.5)
nRBC: 0 % (ref 0.0–0.2)

## 2021-09-07 LAB — GLUCOSE, CAPILLARY
Glucose-Capillary: 179 mg/dL — ABNORMAL HIGH (ref 70–99)
Glucose-Capillary: 157 mg/dL — ABNORMAL HIGH (ref 70–99)
Glucose-Capillary: 158 mg/dL — ABNORMAL HIGH (ref 70–99)
Glucose-Capillary: 166 mg/dL — ABNORMAL HIGH (ref 70–99)
Glucose-Capillary: 145 mg/dL — ABNORMAL HIGH (ref 70–99)

## 2021-09-07 LAB — BASIC METABOLIC PANEL
Anion gap: 9 (ref 5–15)
BUN: 37 mg/dL — ABNORMAL HIGH (ref 8–23)
CO2: 23 mmol/L (ref 22–32)
Calcium: 9.1 mg/dL (ref 8.9–10.3)
Chloride: 105 mmol/L (ref 98–111)
Creatinine, Ser: 1.61 mg/dL — ABNORMAL HIGH (ref 0.44–1.00)
GFR, Estimated: 33 mL/min — ABNORMAL LOW (ref >60)
Glucose, Bld: 179 mg/dL — ABNORMAL HIGH (ref 70–99)
Potassium: 4.1 mmol/L (ref 3.5–5.1)
Sodium: 137 mmol/L (ref 135–145)

## 2021-09-07 LAB — PROTIME-INR
INR: 1.7 — ABNORMAL HIGH (ref 0.8–1.2)
Prothrombin Time: 19.7 seconds — ABNORMAL HIGH (ref 11.4–15.2)

## 2021-09-07 LAB — HEPARIN LEVEL (UNFRACTIONATED): Heparin Unfractionated: 0.41 IU/mL (ref 0.30–0.70)

## 2021-09-07 LAB — SURGICAL PCR SCREEN
MRSA, PCR: NEGATIVE
Staphylococcus aureus: NEGATIVE

## 2021-09-07 SURGERY — CORONARY/GRAFT ANGIOGRAPHY
Anesthesia: LOCAL

## 2021-09-07 MED ORDER — MIDAZOLAM HCL 2 MG/2ML IJ SOLN
INTRAMUSCULAR | Status: DC | PRN
  Administered 2021-09-07 (×2): 1 mg via INTRAVENOUS

## 2021-09-07 MED ORDER — ONDANSETRON HCL 4 MG/2ML IJ SOLN: 4.0000 mg | Freq: Four times a day (QID) | INTRAMUSCULAR | Status: DC | PRN

## 2021-09-07 MED ORDER — ACETAMINOPHEN 325 MG PO TABS: 650.0000 mg | ORAL_TABLET | ORAL | Status: DC | PRN

## 2021-09-07 MED ORDER — HEPARIN (PORCINE) 25000 UT/250ML-% IV SOLN
1200.0000 [IU]/h | INTRAVENOUS | Status: DC
  Administered 2021-09-07 – 2021-09-08 (×2): 1150 [IU]/h via INTRAVENOUS
  Administered 2021-09-08: 1200 [IU]/h via INTRAVENOUS
  Filled 2021-09-07 (×2): qty 250

## 2021-09-07 MED ORDER — HEPARIN (PORCINE) IN NACL 1000-0.9 UT/500ML-% IV SOLN
INTRAVENOUS | Status: DC | PRN
  Administered 2021-09-07 (×2): 500 mL

## 2021-09-07 MED ORDER — FENTANYL CITRATE (PF) 100 MCG/2ML IJ SOLN
INTRAMUSCULAR | Status: DC | PRN
  Administered 2021-09-07 (×2): 25 ug via INTRAVENOUS

## 2021-09-07 MED ORDER — MUPIROCIN 2 % EX OINT
1.0000 "application " | TOPICAL_OINTMENT | Freq: Two times a day (BID) | CUTANEOUS | Status: AC
  Administered 2021-09-07 – 2021-09-11 (×9): 1 via NASAL
  Filled 2021-09-07 (×3): qty 22

## 2021-09-07 MED ORDER — SODIUM CHLORIDE 0.9 % IV SOLN: 250.0000 mL | INTRAVENOUS | Status: DC | PRN

## 2021-09-07 MED ORDER — MIDAZOLAM HCL 2 MG/2ML IJ SOLN
INTRAMUSCULAR | Status: AC
  Filled 2021-09-07: qty 2

## 2021-09-07 MED ORDER — SODIUM CHLORIDE 0.9% FLUSH
3.0000 mL | Freq: Two times a day (BID) | INTRAVENOUS | Status: DC
  Administered 2021-09-08 – 2021-09-12 (×5): 3 mL via INTRAVENOUS

## 2021-09-07 MED ORDER — VERAPAMIL HCL 2.5 MG/ML IV SOLN
INTRAVENOUS | Status: AC
  Filled 2021-09-07: qty 2

## 2021-09-07 MED ORDER — FENTANYL CITRATE (PF) 100 MCG/2ML IJ SOLN
INTRAMUSCULAR | Status: AC
Start: 2021-09-07 — End: ?
  Filled 2021-09-07: qty 2

## 2021-09-07 MED ORDER — LIDOCAINE HCL (PF) 1 % IJ SOLN
INTRAMUSCULAR | Status: AC
  Filled 2021-09-07: qty 30

## 2021-09-07 MED ORDER — SODIUM CHLORIDE 0.9% FLUSH: 3.0000 mL | INTRAVENOUS | Status: DC | PRN

## 2021-09-07 MED ORDER — HEPARIN SODIUM (PORCINE) 1000 UNIT/ML IJ SOLN
INTRAMUSCULAR | Status: DC | PRN
  Administered 2021-09-07: 5000 [IU] via INTRAVENOUS

## 2021-09-07 MED ORDER — LABETALOL HCL 5 MG/ML IV SOLN: 10.0000 mg | INTRAVENOUS | Status: AC | PRN

## 2021-09-07 MED ORDER — HEPARIN (PORCINE) IN NACL 1000-0.9 UT/500ML-% IV SOLN
INTRAVENOUS | Status: AC
  Filled 2021-09-07: qty 500

## 2021-09-07 MED ORDER — ZOLPIDEM TARTRATE 5 MG PO TABS
5.0000 mg | ORAL_TABLET | Freq: Every evening | ORAL | Status: DC | PRN
  Administered 2021-09-07 – 2021-09-10 (×4): 5 mg via ORAL
  Filled 2021-09-07 (×4): qty 1

## 2021-09-07 MED ORDER — VERAPAMIL HCL 2.5 MG/ML IV SOLN
INTRAVENOUS | Status: DC | PRN
  Administered 2021-09-07: 5 mg via INTRA_ARTERIAL

## 2021-09-07 MED ORDER — IOHEXOL 350 MG/ML SOLN
INTRAVENOUS | Status: DC | PRN
  Administered 2021-09-07: 45 mL

## 2021-09-07 MED ORDER — HYDRALAZINE HCL 20 MG/ML IJ SOLN: 10.0000 mg | INTRAMUSCULAR | Status: AC | PRN

## 2021-09-07 MED ORDER — LIDOCAINE HCL (PF) 1 % IJ SOLN
INTRAMUSCULAR | Status: DC | PRN
  Administered 2021-09-07: 2 mL

## 2021-09-07 MED ORDER — SODIUM CHLORIDE 0.9 % IV SOLN: INTRAVENOUS | Status: AC

## 2021-09-07 MED ORDER — HEPARIN SODIUM (PORCINE) 1000 UNIT/ML IJ SOLN
INTRAMUSCULAR | Status: AC
  Filled 2021-09-07: qty 10

## 2021-09-07 SURGICAL SUPPLY — 10 items
CATH DIAG 6FR JR4 (CATHETERS) ×1 IMPLANT
CATH INFINITI 6F 1M (CATHETERS) ×1 IMPLANT
CATH JL3.5 FR DIAG (CATHETERS) ×1 IMPLANT
DEVICE RAD COMP TR BAND LRG (VASCULAR PRODUCTS) ×1 IMPLANT
GLIDESHEATH SLEND SS 6F .021 (SHEATH) ×2 IMPLANT
GUIDEWIRE INQWIRE 1.5J.035X260 (WIRE) IMPLANT
INQWIRE 1.5J .035X260CM (WIRE) ×2
KIT HEART LEFT (KITS) ×3 IMPLANT
PACK CARDIAC CATHETERIZATION (CUSTOM PROCEDURE TRAY) ×3 IMPLANT
TRANSDUCER W/STOPCOCK (MISCELLANEOUS) ×4 IMPLANT

## 2021-09-07 NOTE — Progress Notes (Signed)
ANTICOAGULATION CONSULT NOTE - Follow Up Consult ? ?Pharmacy Consult for heparin ?Indication: mAVR/ACS/Chest pain ? ?Allergies  ?Allergen Reactions  ? Atorvastatin   ?  Muscle pain  ? Dapagliflozin   ?  Other reaction(s): nausea  ? Gabapentin   ?  Other reaction(s): stomach issues  ? Simvastatin Other (See Comments)  ?  Muscle pain  ? Sulfa Antibiotics   ?  unknown  ? Iodinated Contrast Media Rash  ?   Red rash after cardiac cath 1 wk ago, ? Contrast allergy, requires 13 hr prep now per [email or web address] ?   ? ? ?Patient Measurements: ?Height: '5\' 2"'$  (157.5 cm) ?Weight: 77.8 kg (171 lb 8.3 oz) ?IBW/kg (Calculated) : 50.1 ?Heparin Dosing Weight: 66.8kg ? ?Vital Signs: ?Temp: 98.6 ?F (37 ?C) (04/17 0700) ?Temp Source: Oral (04/17 0700) ?BP: 121/50 (04/17 1351) ?Pulse Rate: 49 (04/17 1351) ? ?Labs: ?Recent Labs  ?  09/05/21 ?5625 09/05/21 ?1532 09/05/21 ?1951 09/06/21 ?0156 09/06/21 ?6389 09/06/21 ?0730 09/06/21 ?1020 09/07/21 ?0428 09/07/21 ?1136  ?HGB 9.4*  --   --  9.6*  --   --   --  8.6*  --   ?HCT 30.5*  --   --  31.1*  --   --   --  26.7*  --   ?PLT 113*  --   --  124*  --   --   --  98*  --   ?LABPROT  --   --   --   --   --   --  21.4* 19.7*  --   ?INR  --   --   --   --   --   --  1.9* 1.7*  --   ?HEPARINUNFRC 0.33 0.34  --  0.38  --   --   --  0.41  --   ?CREATININE  --   --   --   --  1.71*  --   --   --  1.61*  ?TROPONINIHS  --   --    < > 1,271* 1,473* 1,220* 874*  --   --   ? < > = values in this interval not displayed.  ? ? ? ?Estimated Creatinine Clearance: 28.7 mL/min (A) (by C-G formula based on SCr of 1.61 mg/dL (H)). ? ? ?Medical History: ?Past Medical History:  ?Diagnosis Date  ? Anemia   ? Anxiety   ? Aortic stenosis   ? a. Now has St Jude mechanical aortic valve (6/00). b. Echo (6/15) with EF 60-65%, mechanical aortic valve with mean gradient 27 mmHg.  ? Arthritis   ? a. Possible c-spine arthritis with pain down left arm.    ? Carotid artery disease ([place])   ? a. Carotid dopplers (7/16) with  40-59% BICA stenosis.    ? Chronic angle-closure glaucoma(365.23)   ? Chronic diastolic CHF (congestive heart failure) ([place])   ? a.  RHC (7/15) with mean RA 2, PA 22/11, mean PCWP 9, CI 3.79.  ? Contrast media allergy   ? Coronary atherosclerosis of native coronary artery   ? a. CABG at time of AVR in 6/00 with RIMA-RCA. b. Abnl nuc 11/2013 -> LHC (7/15) with patent RIMA-RCA, 80% mRCA, 80% pLAD with FFR 0.73, treated with DES to pLAD.  ? Depression   ? Essential hypertension   ? GERD (gastroesophageal reflux disease)   ? Hyperlipidemia   ? Low back pain   ? Neuromuscular disorder ([place])   ? neuropathy  ? Peripheral vascular disease ([place])   ?  a, H/o Right SFA stent. b. Peripheral arterial dopplers (7/16) with right SFA stent patent.    ? PONV (postoperative nausea and vomiting)   ? N&V  ? Type II diabetes mellitus ([place])   ? ? ?Assessment: ?33 YOF presenting with mechanical fall and femur fx, hx of mechanical AVR on warfarin PTA.  INR on admission slightly below mAVR goal of 2.5-3.5 and warfarin on hold for surgery. Patient developed chest pain overnight, troponins cycled [phone] Patient was continued on heparin gtt for ACS r/o.  ? ?Warfarin PTA regimen: 1.5 mg Tues/Thurs, 3 mg all other days ?TR band removed around 1630. ? ?Goal of Therapy:  ?Heparin level 0.3-0.7 units/ml ?Monitor platelets by anticoagulation protocol: Yes ?  ?Plan:  ?Will resume Heparin drip at 1150 units/hr at 1830 ?Heparin level with am labs ?Daily heparin level and CBC ordered ?Monitor for signs/symptoms of bleed ? ?Thank you for allowing pharmacy to be a part of this patient?s care. ? ?Donnald Garre, PharmD ?Clinical Pharmacist ? ?Please check AMION for all [place] numbers ?After 10:00 PM, call [place] [phone] ? ? ? ? ?

## 2021-09-07 NOTE — Progress Notes (Addendum)
? ?Progress Note ? ?Patient Name: Abigail Oliver ?Date of Encounter: 09/07/2021 ? ?[facility] Cardiologist: Quay Burow, MD  ? ?Subjective  ? ?No chest pain overnight, breathing is stable. Knee pain seems well controlled.  ? ?Inpatient Medications  ?  ?Scheduled Meds: ? aspirin EC  81 mg Oral Daily  ? busPIRone  15 mg Oral TID  ? dicyclomine  10 mg Oral TID AC  ? diphenhydrAMINE  50 mg Oral Once  ? Or  ? diphenhydrAMINE  50 mg Intravenous Once  ? docusate sodium  100 mg Oral BID  ? feeding supplement (GLUCERNA SHAKE)  237 mL Oral TID BM  ? insulin aspart  0-15 Units Subcutaneous TID WC  ? insulin aspart  0-5 Units Subcutaneous QHS  ? latanoprost  1 drop Both Eyes QHS  ? metoprolol succinate  50 mg Oral Daily  ? multivitamin with minerals  1 tablet Oral Daily  ? pantoprazole  40 mg Oral Daily  ? rosuvastatin  10 mg Oral QPM  ? sertraline  25 mg Oral Daily  ? sodium chloride flush  3 mL Intravenous Q12H  ? ?Continuous Infusions: ? sodium chloride    ? sodium chloride 1 mL/kg/hr (09/07/21 0509)  ? heparin 1,150 Units/hr (09/07/21 2841)  ? methocarbamol (ROBAXIN) IV    ? ?PRN Meds: ?sodium chloride, alum & mag hydroxide-simeth, bisacodyl, clonazePAM, fentaNYL (SUBLIMAZE) injection, methocarbamol **OR** methocarbamol (ROBAXIN) IV, ondansetron (ZOFRAN) IV, oxyCODONE, polyethylene glycol, sodium chloride flush, traZODone  ? ?Vital Signs  ?  ?Vitals:  ? 09/06/21 1157 09/06/21 1955 09/07/21 0512 09/07/21 0700  ?BP: (!) 126/42 109/90 (!) 114/49 (!) 135/45  ?Pulse: 64 60 (!) 53 (!) 53  ?Resp: '16 20 14 15  '$ ?Temp: 98.4 ?F (36.9 ?C) 98.5 ?F (36.9 ?C) 97.9 ?F (36.6 ?C) 98.6 ?F (37 ?C)  ?TempSrc: Oral Oral Oral Oral  ?SpO2: 96% 95% 97% 100%  ?Weight:      ?Height:      ? ? ?Intake/Output Summary (Last 24 hours) at 09/07/2021 0813 ?Last data filed at 09/07/2021 0743 ?Gross per 24 hour  ?Intake 582.29 ml  ?Output 300 ml  ?Net 282.29 ml  ? ? ?  09/06/2021  ?  4:50 AM 09/04/2021  ? 11:15 AM 01/30/2021  ?  8:17 AM  ?Last 3 Weights   ?Weight (lbs) 171 lb 8.3 oz 169 lb 169 lb 6.4 oz  ?Weight (kg) 77.8 kg 76.658 kg 76.839 kg  ?   ? ?Telemetry  ?  ?SB, PVCs - Personally Reviewed ? ?ECG  ? ?No new tracing this morning ? ?Physical Exam  ? ?GEN: No acute distress.   ?Neck: No JVD ?Cardiac: RRR, + mechanical valve click, no rubs, or gallops.  ?Respiratory: Diminished in bases ?GI: Soft, nontender, non-distended  ?MS: No edema; Left knee immobilizer in place ?Neuro:  Nonfocal  ?Psych: Normal affect  ? ?Labs  ?  ?High Sensitivity Troponin:   ?Recent Labs  ?Lab 09/05/21 ?2125 09/06/21 ?0156 09/06/21 ?3244 09/06/21 ?0730 09/06/21 ?1020  ?TROPONINIHS 292* 1,271* 1,473* 1,220* 874*  ?   ?Chemistry ?Recent Labs  ?Lab 09/04/21 ?1122 09/06/21 ?0102  ?NA 140 136  ?K 3.8 4.0  ?CL 107 105  ?CO2 26 24  ?GLUCOSE 134* 126*  ?BUN 33* 26*  ?CREATININE 1.69* 1.71*  ?CALCIUM 9.3 8.8*  ?MG  --  1.9  ?GFRNONAA 31* 31*  ?ANIONGAP 7 7  ?  ?Lipids No results for input(s): CHOL, TRIG, HDL, LABVLDL, LDLCALC, CHOLHDL in the last 168 hours.  ?  Hematology ?Recent Labs  ?Lab 09/05/21 ?1884 09/06/21 ?1660 09/07/21 ?0428  ?WBC 6.6 8.3 4.0  ?RBC 3.46* 3.53* 3.12*  ?HGB 9.4* 9.6* 8.6*  ?HCT 30.5* 31.1* 26.7*  ?MCV 88.2 88.1 85.6  ?MCH 27.2 27.2 27.6  ?MCHC 30.8 30.9 32.2  ?RDW 17.2* 17.3* 16.8*  ?PLT 113* 124* 98*  ? ?Thyroid No results for input(s): TSH, FREET4 in the last 168 hours.  ?BNP ?Recent Labs  ?Lab 09/06/21 ?6301  ?BNP 1,282.7*  ?  ?DDimer No results for input(s): DDIMER in the last 168 hours.  ? ?Radiology  ?  ?ECHOCARDIOGRAM COMPLETE ? ?Result Date: 09/06/2021 ?   ECHOCARDIOGRAM REPORT   Patient Name:   NETTA FODGE Date of Exam: 09/06/2021 Medical Rec #:  [id number]         Height:       62.0 in Accession #:    [id number]        Weight:       171.5 lb Date of Birth:  07-25-1945         BSA:          1.791 m? Patient Age:    76 years          BP:           108/49 mmHg Patient Gender: F                 HR:           71 bpm. Exam Location:  Inpatient Procedure: 2D Echo,  Cardiac Doppler and Color Doppler Indications:    I21.4 NSTEMI  History:        Patient has prior history of Echocardiogram examinations, most                 recent 02/17/2021. CHF, CAD, Prior CABG, Aortic Valve Disease;                 Risk Factors:Hypertension, Diabetes, Dyslipidemia and Family                 History of Coronary Artery Disease. Aortic Valve Replacement                 (2000, St. Jude Mechanical Valve).  Sonographer:    Alvino Chapel RCS Referring Phys: [id number] [place]  1. Left ventricular ejection fraction, by estimation, is 55 to 60%. The left ventricle has normal function. The left ventricle has no regional wall motion abnormalities. There is mild left ventricular hypertrophy. Left ventricular diastolic parameters are consistent with Grade II diastolic dysfunction (pseudonormalization). Elevated left atrial pressure.  2. Right ventricular systolic function is normal. The right ventricular size is normal. There is moderately elevated pulmonary artery systolic pressure.  3. Left atrial size was mild to moderately dilated.  4. The mitral valve is abnormal. Trivial mitral valve regurgitation. No evidence of mitral stenosis.  5. The tricuspid valve is abnormal.  6. St. Jude Mechanical Valve unknown size is in the AV position. Mild mean gradient 14 mmHg stable from 2019 study. . The aortic valve has been repaired/replaced. Aortic valve regurgitation is not visualized.  7. The inferior vena cava is dilated in size with >50% respiratory variability, suggesting right atrial pressure of 8 mmHg. FINDINGS  Left Ventricle: Left ventricular ejection fraction, by estimation, is 55 to 60%. The left ventricle has normal function. The left ventricle has no regional wall motion abnormalities. The left ventricular internal cavity size was normal in size. There is  mild  left ventricular hypertrophy. Left ventricular diastolic parameters are consistent with Grade II diastolic dysfunction  (pseudonormalization). Elevated left atrial pressure. Right Ventricle: The right ventricular size is normal. Right vetricular wall thickness was not well visualized. Right ventricular systolic function is normal. There is moderately elevated pulmonary artery systolic pressure. The tricuspid regurgitant velocity is 3.11 m/s, and with an assumed right atrial pressure of 8 mmHg, the estimated right ventricular systolic pressure is 67.5 mmHg. Left Atrium: Left atrial size was mild to moderately dilated. Right Atrium: Right atrial size was normal in size. Pericardium: There is no evidence of pericardial effusion. Mitral Valve: The mitral valve is abnormal. There is mild thickening of the mitral valve leaflet(s). There is mild calcification of the mitral valve leaflet(s). Mild mitral annular calcification. Trivial mitral valve regurgitation. No evidence of mitral valve stenosis. Tricuspid Valve: The tricuspid valve is abnormal. Tricuspid valve regurgitation is mild . No evidence of tricuspid stenosis. Aortic Valve: St. Jude Mechanical Valve unknown size is in the AV position. Mild mean gradient 14 mmHg stable from 2019 study. The aortic valve has been repaired/replaced. Aortic valve regurgitation is not visualized. Aortic valve mean gradient measures 14.3 mmHg. Aortic valve peak gradient measures 26.7 mmHg. Aortic valve area, by VTI measures 1.03 cm?. Pulmonic Valve: The pulmonic valve was not well visualized. Pulmonic valve regurgitation is mild. No evidence of pulmonic stenosis. Aorta: The aortic root is normal in size and structure. Venous: The inferior vena cava is dilated in size with greater than 50% respiratory variability, suggesting right atrial pressure of 8 mmHg. IAS/Shunts: No atrial level shunt detected by color flow Doppler.  LEFT VENTRICLE PLAX 2D LVIDd:         4.00 cm   Diastology LVIDs:         2.80 cm   LV e' medial:    5.33 cm/s LV PW:         1.10 cm   LV E/e' medial:  25.3 LV IVS:        1.00 cm   LV  e' lateral:   8.59 cm/s LVOT diam:     1.80 cm   LV E/e' lateral: 15.7 LV SV:         59 LV SV Index:   33 LVOT Area:     2.54 cm?  RIGHT VENTRICLE RV S prime:     8.92 cm/s TAPSE (M-mode): 1.4 cm LEFT ATRIUM

## 2021-09-07 NOTE — Progress Notes (Addendum)
ANTICOAGULATION CONSULT NOTE - Follow Up Consult ? ?Pharmacy Consult for heparin ?Indication: mAVR/ACS/Chest pain ? ?Allergies  ?Allergen Reactions  ? Atorvastatin   ?  Muscle pain  ? Dapagliflozin   ?  Other reaction(s): nausea  ? Gabapentin   ?  Other reaction(s): stomach issues  ? Simvastatin Other (See Comments)  ?  Muscle pain  ? Sulfa Antibiotics   ?  unknown  ? Iodinated Contrast Media Rash  ?   Red rash after cardiac cath 1 wk ago, ? Contrast allergy, requires 13 hr prep now per dr.[email or web address]//[email or web address] ?   ? ? ?Patient Measurements: ?Height: '5\' 2"'$  (157.5 cm) ?Weight: 77.8 kg (171 lb 8.3 oz) ?IBW/kg (Calculated) : 50.1 ?Heparin Dosing Weight: 66.8kg ? ?Vital Signs: ?Temp: 98.6 ?F (37 ?C) (04/17 0700) ?Temp Source: Oral (04/17 0700) ?BP: 135/45 (04/17 0700) ?Pulse Rate: 53 (04/17 0700) ? ?Labs: ?Recent Labs  ?  09/04/21 ?1122 09/04/21 ?1236 09/04/21 ?2347 09/05/21 ?6283 09/05/21 ?1532 09/05/21 ?1951 09/06/21 ?0156 09/06/21 ?1517 09/06/21 ?0730 09/06/21 ?1020 09/07/21 ?0428  ?HGB 9.9*  --   --  9.4*  --   --  9.6*  --   --   --  8.6*  ?HCT 31.6*  --   --  30.5*  --   --  31.1*  --   --   --  26.7*  ?PLT 139*  --   --  113*  --   --  124*  --   --   --  98*  ?LABPROT  --  25.0*  --   --   --   --   --   --   --  21.4*  --   ?INR  --  2.3*  --   --   --   --   --   --   --  1.9*  --   ?HEPARINUNFRC  --   --    < > 0.33 0.34  --  0.38  --   --   --  0.41  ?CREATININE 1.69*  --   --   --   --   --   --  1.71*  --   --   --   ?TROPONINIHS  --   --   --   --   --    < > 1,271* 1,473* 1,220* 874*  --   ? < > = values in this interval not displayed.  ? ? ? ?Estimated Creatinine Clearance: 27 mL/min (A) (by C-G formula based on SCr of 1.71 mg/dL (H)). ? ? ?Medical History: ?Past Medical History:  ?Diagnosis Date  ? Anemia   ? Anxiety   ? Aortic stenosis   ? a. Now has St Jude mechanical aortic valve (6/00). b. Echo (6/15) with EF 60-65%, mechanical aortic valve with mean gradient 27 mmHg.  ? Arthritis   ? a. Possible  c-spine arthritis with pain down left arm.    ? Carotid artery disease ([place])   ? a. Carotid dopplers (7/16) with 40-59% BICA stenosis.    ? Chronic angle-closure glaucoma(365.23)   ? Chronic diastolic CHF (congestive heart failure) ([place])   ? a.  RHC (7/15) with mean RA 2, PA 22/11, mean PCWP 9, CI 3.79.  ? Contrast media allergy   ? Coronary atherosclerosis of native coronary artery   ? a. CABG at time of AVR in 6/00 with RIMA-RCA. b. Abnl nuc 11/2013 -> LHC (7/15) with patent RIMA-RCA, 80% mRCA, 80% pLAD with FFR  0.73, treated with DES to pLAD.  ? Depression   ? Essential hypertension   ? GERD (gastroesophageal reflux disease)   ? Hyperlipidemia   ? Low back pain   ? Neuromuscular disorder ([place])   ? neuropathy  ? Peripheral vascular disease ([place])   ? a, H/o Right SFA stent. b. Peripheral arterial dopplers (7/16) with right SFA stent patent.    ? PONV (postoperative nausea and vomiting)   ? N&V  ? Type II diabetes mellitus ([place])   ? ? ?Assessment: ?67 YOF presenting with mechanical fall and femur fx, hx of mechanical AVR on warfarin PTA.  INR on admission slightly below mAVR goal of 2.5-3.5 and warfarin on hold for surgery. Patient developed chest pain overnight, troponins cycled [phone]). Patient was continued on heparin gtt for ACS r/o.  ? ?Warfarin PTA regimen: 1.5 mg Tues/Thurs, 3 mg all other days ? ?Heparin level is therapeutic at 0.41, CBC down slightly. INR 1.7. ? ?Goal of Therapy:  ?Heparin level 0.3-0.7 units/ml ?Monitor platelets by anticoagulation protocol: Yes ?  ?Plan:  ?Cotinue heparin 1150 units/hr ?Daily heparin level, CBC ? ?ADDENDUM 1330: ?Pt s/p LHC with no intervention. Pharmacy consulted to resume IV heparin 2h after TR band removed ? ?Plan: ?Resume heparin 1150 units/h no bolus pending TR band removal ? ? ?Arrie Senate, PharmD, BCPS, BCCP ?Clinical Pharmacist ?[phone] ?Please check AMION for all [facility] numbers ?09/07/2021 ? ? ?

## 2021-09-07 NOTE — Interval H&P Note (Signed)
History and Physical Interval Note: ? ?09/07/2021 ?9:22 AM ? ?Abigail Oliver  has presented today for surgery, with the diagnosis of NSTEMI.  The various methods of treatment have been discussed with the patient and family. After consideration of risks, benefits and other options for treatment, the patient has consented to  Procedure(s): ?LEFT HEART CATH AND CORONARY ANGIOGRAPHY (N/A) as a surgical intervention.  The patient's history has been reviewed, patient examined, no change in status, stable for surgery.  I have reviewed the patient's chart and labs.  Questions were answered to the patient's satisfaction.   ? ?Cath Lab Visit (complete for each Cath Lab visit) ? ?Clinical Evaluation Leading to the Procedure:  ? ?ACS: Yes.   ? ?Non-ACS:   ? ?Anginal Classification: CCS IV ? ?Anti-ischemic medical therapy: Minimal Therapy (1 class of medications) ? ?Non-Invasive Test Results: No non-invasive testing performed ? ?Prior CABG: Previous CABG ? ? ? ? ? ? ? ?[place] ? ? ?

## 2021-09-07 NOTE — TOC CAGE-AID Note (Signed)
Transition of Care (TOC) - CAGE-AID Screening ? ? ?Patient Details  ?Name: Abigail Oliver ?MRN: [id number] ?Date of Birth: 05-02-1946 ? ?Transition of Care (TOC) CM/SW Contact:    ?Caterina Racine C Tarpley-Carter, LCSWA ?Phone Number: ?09/07/2021, 3:23 PM ? ? ?Clinical Narrative: ?Pt participated in [place].  Pt stated she does not use substance or ETOH.  Pt was not offered resources, due to no usage of substance or ETOH.   ? ?Passenger transport manager, MSW, LCSW-A ?Pronouns:  She/Her/Hers ?[facility] ?Clinical Social Worker ?Direct Number:  [phone] ?[email or web address] ? ?CAGE-AID Screening: ?  ? ?Have You Ever Felt You Ought to Cut Down on Your Drinking or Drug Use?: No ?Have People Annoyed You By Critizing Your Drinking Or Drug Use?: No ?Have You Felt Bad Or Guilty About Your Drinking Or Drug Use?: No ?Have You Ever Had a Drink or Used Drugs First Thing In The Morning to Steady Your Nerves or to Get Rid of a Hangover?: No ?CAGE-AID Score: 0 ? ?Substance Abuse Education Offered: No ? ?  ? ? ? ? ? ? ?

## 2021-09-07 NOTE — Progress Notes (Signed)
?PROGRESS NOTE ? ? ? ?Abigail Oliver  SAY:[id number] DOB: 02-07-1946 DOA: 09/04/2021 ?PCP: [name], PA-C  ? ? ?Brief Narrative:  ?76 year old with history of coronary artery disease status post CABG, LAD stent 2015 , AVR on Coumadin, chronic diastolic dysfunction, hypertension, hyperlipidemia, type 2 diabetes on metformin presented with ground-level fall and found to have left intercondylar fracture of femur with intra-articular extension.  Admitted for surgical intervention. ?4/16, suffered from non-STEMI with nonspecific symptoms of chest discomfort, troponin elevated , no ongoing chest pain.  ?4/17, cardiac cath with minimal obstructions of LAD stent, no intervention needed . Patient is chest pain free now. ? ? ?Assessment & Plan: ?  ?Close traumatic intercondylar and intra-articular left distal femur fracture: ?Adequate pain medications. Use oral opiates as possible. Nonweightbearing until surgery. Surgery delayed because of Coumadin and now with NSTEMI. ?4/17, cardiology cleared for surgery.  Orthopedics schedule pending. ? ?Non-ST elevation MI: Patient started having nonspecific symptoms nausea, chest discomfort overnight.  Twelve-lead EKG with nonspecific lateral lead changes.  Troponins elevated.  Chest pain resolved. ?Already on aspirin and on heparin infusion. ?Seen by cardiology.  History of CABG and LAD stent. ?Already on aspirin, metoprolol and Crestor.   ?Cardiac cath today with no significant obstructive disease, cleared for orthopedic surgery. ? ?History of AVR on Coumadin, holding Coumadin for surgery.  Currently bridged with heparin. ? ?Type 2 diabetes, well controlled on metformin and Jardiance: Hold.  Bridged with sliding scale insulin. ? ?Essential hypertension: On Toprol-XL.  Continue. ? ?Anxiety/depression: On BuSpar, Klonopin, sertraline and trazodone that is continued. ? ? ?DVT prophylaxis:   Heparin infusion ? ? ?Code Status: Full code ?Family Communication: None  today. ?Disposition Plan: Status is: Inpatient ?Remains inpatient appropriate because: Inpatient surgery needed ?  ? ? ?Consultants:  ?Orthopedics ?Cardiology ? ?Procedures:  ?Cardiac cath ? ?Antimicrobials:  ?None ? ? ?Subjective: ? ?Seen and examined.  No overnight events.  Denied any chest pain or shortness of breath.  No more recurrent episodes.  Happy to get out of the bed and sit in chair. ? ?Objective: ?Vitals:  ? 09/07/21 1229 09/07/21 1234 09/07/21 1239 09/07/21 1244  ?BP: (!) 100/43 (!) 97/47 (!) 105/47 (!) 100/53  ?Pulse: (!) 56 (!) 54 (!) 54 (!) 55  ?Resp:      ?Temp:      ?TempSrc:      ?SpO2: (!) 85% 94% 95% 94%  ?Weight:      ?Height:      ? ? ?Intake/Output Summary (Last 24 hours) at 09/07/2021 1338 ?Last data filed at 09/07/2021 0743 ?Gross per 24 hour  ?Intake 582.29 ml  ?Output 300 ml  ?Net 282.29 ml  ? ? ?Filed Weights  ? 09/04/21 1115 09/06/21 0450  ?Weight: 76.7 kg 77.8 kg  ? ? ?Examination: ? ?General exam: Appears calm and comfortable on room air in the morning rounds. ?Respiratory system: Clear to auscultation.  No added sounds. ?Cardiovascular system: S1 & S2 heard, RRR.  Aortic ejection murmur present. ?Gastrointestinal system: Abdomen is nondistended, soft and nontender. No organomegaly or masses felt. Normal bowel sounds heard. ?Central nervous system: Alert and oriented. No focal neurological deficits. ?Extremities:  ?Left knee with moderate knee swelling, on immobilizer.  Distal neurovascular status intact. ? ? ? ?Data Reviewed: I have personally reviewed following labs and imaging studies ? ?CBC: ?Recent Labs  ?Lab 09/04/21 ?1122 09/05/21 ?2355 09/06/21 ?7322 09/07/21 ?0428  ?WBC 5.5 6.6 8.3 4.0  ?HGB 9.9* 9.4* 9.6* 8.6*  ?HCT 31.6* 30.5* 31.1*  26.7*  ?MCV 89.3 88.2 88.1 85.6  ?PLT 139* 113* 124* 98*  ? ?Basic Metabolic Panel: ?Recent Labs  ?Lab 09/04/21 ?1122 09/06/21 ?8502 09/07/21 ?1136  ?NA 140 136 137  ?K 3.8 4.0 4.1  ?CL 107 105 105  ?CO2 '26 24 23  '$ ?GLUCOSE 134* 126* 179*  ?BUN  33* 26* 37*  ?CREATININE 1.69* 1.71* 1.61*  ?CALCIUM 9.3 8.8* 9.1  ?MG  --  1.9  --   ?PHOS  --  3.4  --   ? ?GFR: ?Estimated Creatinine Clearance: 28.7 mL/min (A) (by C-G formula based on SCr of 1.61 mg/dL (H)). ?Liver Function Tests: ?No results for input(s): AST, ALT, ALKPHOS, BILITOT, PROT, ALBUMIN in the last 168 hours. ?No results for input(s): LIPASE, AMYLASE in the last 168 hours. ?No results for input(s): AMMONIA in the last 168 hours. ?Coagulation Profile: ?Recent Labs  ?Lab 09/04/21 ?1236 09/06/21 ?1020 09/07/21 ?0428  ?INR 2.3* 1.9* 1.7*  ? ?Cardiac Enzymes: ?No results for input(s): CKTOTAL, CKMB, CKMBINDEX, TROPONINI in the last 168 hours. ?BNP (last 3 results) ?No results for input(s): PROBNP in the last 8760 hours. ?HbA1C: ?No results for input(s): HGBA1C in the last 72 hours. ? ?CBG: ?Recent Labs  ?Lab 09/06/21 ?1118 09/06/21 ?1608 09/06/21 ?7741 09/07/21 ?2878 09/07/21 ?1147  ?GLUCAP 173* 93 225* 179* 157*  ? ?Lipid Profile: ?No results for input(s): CHOL, HDL, LDLCALC, TRIG, CHOLHDL, LDLDIRECT in the last 72 hours. ?Thyroid Function Tests: ?No results for input(s): TSH, T4TOTAL, FREET4, T3FREE, THYROIDAB in the last 72 hours. ?Anemia Panel: ?No results for input(s): VITAMINB12, FOLATE, FERRITIN, TIBC, IRON, RETICCTPCT in the last 72 hours. ?Sepsis Labs: ?No results for input(s): PROCALCITON, LATICACIDVEN in the last 168 hours. ? ?Recent Results (from the past 240 hour(s))  ?Surgical PCR screen     Status: None  ? Collection Time: 09/07/21  8:14 AM  ? Specimen: Nasal Mucosa; Nasal Swab  ?Result Value Ref Range Status  ? MRSA, PCR NEGATIVE NEGATIVE Final  ? Staphylococcus aureus NEGATIVE NEGATIVE Final  ?  Comment: (NOTE) ?The Xpert SA Assay (FDA approved for NASAL specimens in patients 50 ?years of age and older), is one component of a comprehensive ?surveillance program. It is not intended to diagnose infection nor to ?guide or monitor treatment. ?Performed at [facility], [place] [address], [place], [place] ?[address] ?  ?  ? ? ? ? ? ?Radiology Studies: ?CARDIAC CATHETERIZATION ? ?Result Date: 09/07/2021 ?  Prox LAD lesion is 30% stenosed.   Mid LAD lesion is 50% stenosed.   Dist LM to Ost LAD lesion is 40% stenosed.   Dist RCA lesion is 100% stenosed. 1.  Patent proximal LAD stent. 2.  Patent RIMA to PDA. 3.  Moderate distal left main and mid LAD disease. 4.  Left heart catheterization was deferred due to presence of mechanical aortic valve replacement. Recommendations: Results reviewed with Dr. Irish Lack.  No intervention was pursued and the patient will be referred for urgent orthopedic surgery.  ? ?ECHOCARDIOGRAM COMPLETE ? ?Result Date: 09/06/2021 ?   ECHOCARDIOGRAM REPORT   Patient Name:   MACKINLEY CASSADAY Date of Exam: 09/06/2021 Medical Rec #:  [id number]         Height:       62.0 in Accession #:    [id number]        Weight:       171.5 lb Date of Birth:  Feb 01, 1946         BSA:  1.791 m? Patient Age:    23 years          BP:           108/49 mmHg Patient Gender: F                 HR:           71 bpm. Exam Location:  Inpatient Procedure: 2D Echo, Cardiac Doppler and Color Doppler Indications:    I21.4 NSTEMI  History:        Patient has prior history of Echocardiogram examinations, most                 recent 02/17/2021. CHF, CAD, Prior CABG, Aortic Valve Disease;                 Risk Factors:Hypertension, Diabetes, Dyslipidemia and Family                 History of Coronary Artery Disease. Aortic Valve Replacement                 (2000, St. Jude Mechanical Valve).  Sonographer:    Alvino Chapel RCS Referring Phys: [id number] [place]  1. Left ventricular ejection fraction, by estimation, is 55 to 60%. The left ventricle has normal function. The left ventricle has no regional wall motion abnormalities. There is mild left ventricular hypertrophy. Left ventricular diastolic parameters are consistent with Grade II diastolic dysfunction (pseudonormalization). Elevated left atrial  pressure.  2. Right ventricular systolic function is normal. The right ventricular size is normal. There is moderately elevated pulmonary artery systolic pressure.  3. Left atrial size was mild to moderately dilated.  4.

## 2021-09-07 NOTE — Plan of Care (Signed)

## 2021-09-08 DIAGNOSIS — S72422A Displaced fracture of lateral condyle of left femur, initial encounter for closed fracture: Secondary | ICD-10-CM | POA: Diagnosis not present

## 2021-09-08 DIAGNOSIS — S72432A Displaced fracture of medial condyle of left femur, initial encounter for closed fracture: Secondary | ICD-10-CM | POA: Diagnosis not present

## 2021-09-08 DIAGNOSIS — Z952 Presence of prosthetic heart valve: Secondary | ICD-10-CM | POA: Diagnosis not present

## 2021-09-08 DIAGNOSIS — I2510 Atherosclerotic heart disease of native coronary artery without angina pectoris: Secondary | ICD-10-CM | POA: Diagnosis not present

## 2021-09-08 LAB — CBC
HCT: 26.4 % — ABNORMAL LOW (ref 36.0–46.0)
Hemoglobin: 8.3 g/dL — ABNORMAL LOW (ref 12.0–15.0)
MCH: 27.5 pg (ref 26.0–34.0)
MCHC: 31.4 g/dL (ref 30.0–36.0)
MCV: 87.4 fL (ref 80.0–100.0)
Platelets: 130 10*3/uL — ABNORMAL LOW (ref 150–400)
RBC: 3.02 MIL/uL — ABNORMAL LOW (ref 3.87–5.11)
RDW: 16.9 % — ABNORMAL HIGH (ref 11.5–15.5)
WBC: 8.7 10*3/uL (ref 4.0–10.5)
nRBC: 0 % (ref 0.0–0.2)

## 2021-09-08 LAB — GLUCOSE, CAPILLARY
Glucose-Capillary: 123 mg/dL — ABNORMAL HIGH (ref 70–99)
Glucose-Capillary: 123 mg/dL — ABNORMAL HIGH (ref 70–99)
Glucose-Capillary: 99 mg/dL (ref 70–99)
Glucose-Capillary: 126 mg/dL — ABNORMAL HIGH (ref 70–99)

## 2021-09-08 LAB — BASIC METABOLIC PANEL
Anion gap: 13 (ref 5–15)
BUN: 44 mg/dL — ABNORMAL HIGH (ref 8–23)
CO2: 19 mmol/L — ABNORMAL LOW (ref 22–32)
Calcium: 9 mg/dL (ref 8.9–10.3)
Chloride: 102 mmol/L (ref 98–111)
Creatinine, Ser: 1.69 mg/dL — ABNORMAL HIGH (ref 0.44–1.00)
GFR, Estimated: 31 mL/min — ABNORMAL LOW (ref >60)
Glucose, Bld: 149 mg/dL — ABNORMAL HIGH (ref 70–99)
Potassium: 4 mmol/L (ref 3.5–5.1)
Sodium: 134 mmol/L — ABNORMAL LOW (ref 135–145)

## 2021-09-08 LAB — MAGNESIUM: Magnesium: 2.3 mg/dL (ref 1.7–2.4)

## 2021-09-08 LAB — HEPARIN LEVEL (UNFRACTIONATED): Heparin Unfractionated: 0.31 IU/mL (ref 0.30–0.70)

## 2021-09-08 MED ORDER — POVIDONE-IODINE 10 % EX SWAB: 2.0000 "application " | Freq: Once | CUTANEOUS | Status: DC

## 2021-09-08 MED ORDER — CHLORHEXIDINE GLUCONATE 4 % EX LIQD
60.0000 mL | Freq: Once | CUTANEOUS | Status: AC
Start: 2021-09-08 — End: 2021-09-09
  Administered 2021-09-09: 4 via TOPICAL
  Filled 2021-09-08: qty 60

## 2021-09-08 MED ORDER — CEFAZOLIN SODIUM-DEXTROSE 2-4 GM/100ML-% IV SOLN
2.0000 g | INTRAVENOUS | Status: AC
  Administered 2021-09-09: 2 g via INTRAVENOUS
  Filled 2021-09-08 (×2): qty 100

## 2021-09-08 NOTE — Plan of Care (Signed)

## 2021-09-08 NOTE — Care Management Important Message (Signed)
Important Message ? ?Patient Details  ?Name: Abigail Oliver ?MRN: [id number] ?Date of Birth: [birth year] ? ? ?Medicare Important Message Given:  Yes ? ? ? ? ?Shelda Altes ?09/08/2021, 7:58 AM ?

## 2021-09-08 NOTE — Progress Notes (Addendum)
? ?Progress Note ? ?Patient Name: Abigail Oliver ?Date of Encounter: 09/08/2021 ? ?[facility] Cardiologist: Quay Burow, MD  ? ?Subjective  ? ?Hoping to see orthopedics today to discuss surgery. No cardiac complaints.  ? ?Inpatient Medications  ?  ?Scheduled Meds: ? aspirin EC  81 mg Oral Daily  ? dicyclomine  10 mg Oral TID AC  ? docusate sodium  100 mg Oral BID  ? feeding supplement (GLUCERNA SHAKE)  237 mL Oral TID BM  ? insulin aspart  0-15 Units Subcutaneous TID WC  ? insulin aspart  0-5 Units Subcutaneous QHS  ? latanoprost  1 drop Both Eyes QHS  ? metoprolol succinate  50 mg Oral Daily  ? multivitamin with minerals  1 tablet Oral Daily  ? mupirocin ointment  1 application. Nasal BID  ? pantoprazole  40 mg Oral Daily  ? rosuvastatin  10 mg Oral QPM  ? sertraline  25 mg Oral Daily  ? sodium chloride flush  3 mL Intravenous Q12H  ? sodium chloride flush  3 mL Intravenous Q12H  ? ?Continuous Infusions: ? sodium chloride    ? heparin 1,150 Units/hr (09/08/21 0657)  ? methocarbamol (ROBAXIN) IV    ? ?PRN Meds: ?sodium chloride, acetaminophen, alum & mag hydroxide-simeth, bisacodyl, clonazePAM, fentaNYL (SUBLIMAZE) injection, methocarbamol **OR** methocarbamol (ROBAXIN) IV, ondansetron (ZOFRAN) IV, ondansetron (ZOFRAN) IV, oxyCODONE, polyethylene glycol, sodium chloride flush, zolpidem  ? ?Vital Signs  ?  ?Vitals:  ? 09/07/21 1600 09/07/21 1957 09/08/21 0628 09/08/21 0730  ?BP: (!) 116/44 112/78 (!) 142/53 (!) 132/50  ?Pulse: (!) 48 66 (!) 56 65  ?Resp: '16 17 18 20  '$ ?Temp:  98.3 ?F (36.8 ?C) 97.8 ?F (36.6 ?C) (!) 97.5 ?F (36.4 ?C)  ?TempSrc:  Oral Oral Oral  ?SpO2: 100% 100% 100% 95%  ?Weight:      ?Height:      ? ? ?Intake/Output Summary (Last 24 hours) at 09/08/2021 0846 ?Last data filed at 09/08/2021 5697 ?Gross per 24 hour  ?Intake 1284.08 ml  ?Output 950 ml  ?Net 334.08 ml  ? ? ?  09/06/2021  ?  4:50 AM 09/04/2021  ? 11:15 AM 01/30/2021  ?  8:17 AM  ?Last 3 Weights  ?Weight (lbs) 171 lb 8.3 oz 169 lb 169 lb  6.4 oz  ?Weight (kg) 77.8 kg 76.658 kg 76.839 kg  ?   ? ?Telemetry  ?  ?SR, freq PVCs - Personally Reviewed ? ?ECG  ?  ?No new tracing  ? ?Physical Exam  ? ?GEN: No acute distress.   ?Neck: No JVD ?Cardiac: RRR, + mechanical valve click, no rubs, or gallops.  ?Respiratory: Clear to auscultation bilaterally. ?GI: Soft, nontender, non-distended  ?MS: No edema; Left knee immobilizer in place. Right radial cath site stable.  ?Neuro:  Nonfocal  ?Psych: Normal affect  ? ?Labs  ?  ?High Sensitivity Troponin:   ?Recent Labs  ?Lab 09/05/21 ?2125 09/06/21 ?0156 09/06/21 ?9480 09/06/21 ?0730 09/06/21 ?1020  ?TROPONINIHS 292* 1,271* 1,473* 1,220* 874*  ?   ?Chemistry ?Recent Labs  ?Lab 09/06/21 ?1655 09/07/21 ?1136 09/08/21 ?0448  ?NA 136 137 134*  ?K 4.0 4.1 4.0  ?CL 105 105 102  ?CO2 24 23 19*  ?GLUCOSE 126* 179* 149*  ?BUN 26* 37* 44*  ?CREATININE 1.71* 1.61* 1.69*  ?CALCIUM 8.8* 9.1 9.0  ?MG 1.9  --   --   ?GFRNONAA 31* 33* 31*  ?ANIONGAP '7 9 13  '$ ?  ?Lipids No results for input(s): CHOL, TRIG, HDL,  LABVLDL, LDLCALC, CHOLHDL in the last 168 hours.  ?Hematology ?Recent Labs  ?Lab 09/06/21 ?0156 09/07/21 ?0428 09/08/21 ?0448  ?WBC 8.3 4.0 8.7  ?RBC 3.53* 3.12* 3.02*  ?HGB 9.6* 8.6* 8.3*  ?HCT 31.1* 26.7* 26.4*  ?MCV 88.1 85.6 87.4  ?MCH 27.2 27.6 27.5  ?MCHC 30.9 32.2 31.4  ?RDW 17.3* 16.8* 16.9*  ?PLT 124* 98* 130*  ? ?Thyroid No results for input(s): TSH, FREET4 in the last 168 hours.  ?BNP ?Recent Labs  ?Lab 09/06/21 ?8280  ?BNP 1,282.7*  ?  ?DDimer No results for input(s): DDIMER in the last 168 hours.  ? ?Radiology  ?  ?CARDIAC CATHETERIZATION ? ?Result Date: 09/07/2021 ?  Prox LAD lesion is 30% stenosed.   Mid LAD lesion is 50% stenosed.   Dist LM to Ost LAD lesion is 40% stenosed.   Dist RCA lesion is 100% stenosed. 1.  Patent proximal LAD stent. 2.  Patent RIMA to PDA. 3.  Moderate distal left main and mid LAD disease. 4.  Left heart catheterization was deferred due to presence of mechanical aortic valve replacement.  Recommendations: Results reviewed with Dr. Irish Lack.  No intervention was pursued and the patient will be referred for urgent orthopedic surgery.  ? ?ECHOCARDIOGRAM COMPLETE ? ?Result Date: 09/06/2021 ?   ECHOCARDIOGRAM REPORT   Patient Name:   Abigail Oliver Date of Exam: 09/06/2021 Medical Rec #:  [id number]         Height:       62.0 in Accession #:    [id number]        Weight:       171.5 lb Date of Birth:  [birth year]         BSA:          1.791 m? Patient Age:    76 years          BP:           108/49 mmHg Patient Gender: F                 HR:           71 bpm. Exam Location:  Inpatient Procedure: 2D Echo, Cardiac Doppler and Color Doppler Indications:    I21.4 NSTEMI  History:        Patient has prior history of Echocardiogram examinations, most                 recent 02/17/2021. CHF, CAD, Prior CABG, Aortic Valve Disease;                 Risk Factors:Hypertension, Diabetes, Dyslipidemia and Family                 History of Coronary Artery Disease. Aortic Valve Replacement                 (2000, St. Jude Mechanical Valve).  Sonographer:    Alvino Chapel RCS Referring Phys: [id number] [place]  1. Left ventricular ejection fraction, by estimation, is 55 to 60%. The left ventricle has normal function. The left ventricle has no regional wall motion abnormalities. There is mild left ventricular hypertrophy. Left ventricular diastolic parameters are consistent with Grade II diastolic dysfunction (pseudonormalization). Elevated left atrial pressure.  2. Right ventricular systolic function is normal. The right ventricular size is normal. There is moderately elevated pulmonary artery systolic pressure.  3. Left atrial size was mild to moderately dilated.  4. The mitral valve is abnormal. Trivial mitral valve regurgitation. No  evidence of mitral stenosis.  5. The tricuspid valve is abnormal.  6. St. Jude Mechanical Valve unknown size is in the AV position. Mild mean gradient 14 mmHg stable from 2019 study.  . The aortic valve has been repaired/replaced. Aortic valve regurgitation is not visualized.  7. The inferior vena cava is dilated in size with >50% respiratory variability, suggesting right atrial pressure of 8 mmHg. FINDINGS  Left Ventricle: Left ventricular ejection fraction, by estimation, is 55 to 60%. The left ventricle has normal function. The left ventricle has no regional wall motion abnormalities. The left ventricular internal cavity size was normal in size. There is  mild left ventricular hypertrophy. Left ventricular diastolic parameters are consistent with Grade II diastolic dysfunction (pseudonormalization). Elevated left atrial pressure. Right Ventricle: The right ventricular size is normal. Right vetricular wall thickness was not well visualized. Right ventricular systolic function is normal. There is moderately elevated pulmonary artery systolic pressure. The tricuspid regurgitant velocity is 3.11 m/s, and with an assumed right atrial pressure of 8 mmHg, the estimated right ventricular systolic pressure is 93.7 mmHg. Left Atrium: Left atrial size was mild to moderately dilated. Right Atrium: Right atrial size was normal in size. Pericardium: There is no evidence of pericardial effusion. Mitral Valve: The mitral valve is abnormal. There is mild thickening of the mitral valve leaflet(s). There is mild calcification of the mitral valve leaflet(s). Mild mitral annular calcification. Trivial mitral valve regurgitation. No evidence of mitral valve stenosis. Tricuspid Valve: The tricuspid valve is abnormal. Tricuspid valve regurgitation is mild . No evidence of tricuspid stenosis. Aortic Valve: St. Jude Mechanical Valve unknown size is in the AV position. Mild mean gradient 14 mmHg stable from 2019 study. The aortic valve has been repaired/replaced. Aortic valve regurgitation is not visualized. Aortic valve mean gradient measures 14.3 mmHg. Aortic valve peak gradient measures 26.7 mmHg. Aortic valve area,  by VTI measures 1.03 cm?. Pulmonic Valve: The pulmonic valve was not well visualized. Pulmonic valve regurgitation is mild. No evidence of pulmonic stenosis. Aorta: The aortic root is normal in size a

## 2021-09-08 NOTE — Progress Notes (Signed)
?PROGRESS NOTE ? ? ? ?Abigail Oliver  EXN:[id number] DOB: Mar 10, 1946 DOA: 09/04/2021 ?PCP: [name], PA-C  ? ? ?Brief Narrative:  ?76 year old with history of coronary artery disease status post CABG, LAD stent 2015 , AVR on Coumadin, chronic diastolic dysfunction, hypertension, hyperlipidemia, type 2 diabetes on metformin presented with ground-level fall and found to have left intercondylar fracture of femur with intra-articular extension.  Admitted for surgical intervention. ?4/16, suffered from non-STEMI with nonspecific symptoms of chest discomfort, troponin elevated , no ongoing chest pain.  ?4/17, cardiac cath with minimal obstructions of LAD stent, no intervention needed . Patient is chest pain free now. ? ? ?Assessment & Plan: ?  ?Close traumatic intercondylar and intra-articular left distal femur fracture: ?Adequate pain medications. Nonweightbearing until surgery. Surgery delayed because of Coumadin and now with NSTEMI. ?4/17, cardiology cleared for surgery.  Orthopedics schedule pending. ? ?Demand ischemia: Patient started having nonspecific symptoms nausea, chest discomfort overnight.  Twelve-lead EKG with nonspecific lateral lead changes.  Troponins elevated.  Chest pain resolved. ?Already on aspirin and on heparin infusion. ?Seen by cardiology.  History of CABG and LAD stent. ?Already on aspirin, metoprolol and Crestor.   ?Cardiac cath 4/17 with no significant obstructive disease, cleared for orthopedic surgery.  ? ?History of AVR on Coumadin, holding Coumadin for surgery.  Currently bridged with heparin. ? ?Type 2 diabetes, well controlled on metformin and Jardiance: Hold.  Bridged with sliding scale insulin. ? ?Essential hypertension: On Toprol-XL.  Continue. ? ?Anxiety/depression: On BuSpar, Klonopin, sertraline and trazodone that is continued. ? ? ?DVT prophylaxis:   Heparin infusion ? ? ?Code Status: Full code ?Family Communication: None today. ?Disposition Plan: Status is:  Inpatient ?Remains inpatient appropriate because: Inpatient surgery needed ?  ? ? ?Consultants:  ?Orthopedics ?Cardiology ? ?Procedures:  ?Cardiac cath ? ?Antimicrobials:  ?None ? ? ?Subjective: ? ?Seen and examined.  No overnight events.  Quilting blanket.  Without any chest pain. ?Waiting to talk to orthopedics. ? ?Objective: ?Vitals:  ? 09/07/21 1600 09/07/21 1957 09/08/21 0628 09/08/21 0730  ?BP: (!) 116/44 112/78 (!) 142/53 (!) 132/50  ?Pulse: (!) 48 66 (!) 56 65  ?Resp: '16 17 18 20  '$ ?Temp:  98.3 ?F (36.8 ?C) 97.8 ?F (36.6 ?C) (!) 97.5 ?F (36.4 ?C)  ?TempSrc:  Oral Oral Oral  ?SpO2: 100% 100% 100% 95%  ?Weight:      ?Height:      ? ? ?Intake/Output Summary (Last 24 hours) at 09/08/2021 1241 ?Last data filed at 09/08/2021 0900 ?Gross per 24 hour  ?Intake 1020.95 ml  ?Output 950 ml  ?Net 70.95 ml  ? ? ?Filed Weights  ? 09/04/21 1115 09/06/21 0450  ?Weight: 76.7 kg 77.8 kg  ? ? ?Examination: ? ?General: Looks comfortable.  On room air. ?Cardiovascular: S1-S2 normal.  Regular rate rhythm. ?Respiratory: Bilateral clear.  No added sounds. ?Gastrointestinal: Soft.  Nontender.  Bowel sound present. ?Ext: No swelling or edema.  Left knee on immobilizer, minimal knee swelling. ?Neuro: Intact. ? ? ? ? ?Data Reviewed: I have personally reviewed following labs and imaging studies ? ?CBC: ?Recent Labs  ?Lab 09/04/21 ?1122 09/05/21 ?4967 09/06/21 ?0156 09/07/21 ?5916 09/08/21 ?0448  ?WBC 5.5 6.6 8.3 4.0 8.7  ?HGB 9.9* 9.4* 9.6* 8.6* 8.3*  ?HCT 31.6* 30.5* 31.1* 26.7* 26.4*  ?MCV 89.3 88.2 88.1 85.6 87.4  ?PLT 139* 113* 124* 98* 130*  ? ?Basic Metabolic Panel: ?Recent Labs  ?Lab 09/04/21 ?1122 09/06/21 ?3846 09/07/21 ?1136 09/08/21 ?0448  ?NA 140 136 137 134*  ?  K 3.8 4.0 4.1 4.0  ?CL 107 105 105 102  ?CO2 '26 24 23 '$ 19*  ?GLUCOSE 134* 126* 179* 149*  ?BUN 33* 26* 37* 44*  ?CREATININE 1.69* 1.71* 1.61* 1.69*  ?CALCIUM 9.3 8.8* 9.1 9.0  ?MG  --  1.9  --  2.3  ?PHOS  --  3.4  --   --   ? ?GFR: ?Estimated Creatinine Clearance: 27.4  mL/min (A) (by C-G formula based on SCr of 1.69 mg/dL (H)). ?Liver Function Tests: ?No results for input(s): AST, ALT, ALKPHOS, BILITOT, PROT, ALBUMIN in the last 168 hours. ?No results for input(s): LIPASE, AMYLASE in the last 168 hours. ?No results for input(s): AMMONIA in the last 168 hours. ?Coagulation Profile: ?Recent Labs  ?Lab 09/04/21 ?1236 09/06/21 ?1020 09/07/21 ?0428  ?INR 2.3* 1.9* 1.7*  ? ?Cardiac Enzymes: ?No results for input(s): CKTOTAL, CKMB, CKMBINDEX, TROPONINI in the last 168 hours. ?BNP (last 3 results) ?No results for input(s): PROBNP in the last 8760 hours. ?HbA1C: ?No results for input(s): HGBA1C in the last 72 hours. ? ?CBG: ?Recent Labs  ?Lab 09/07/21 ?1338 09/07/21 ?1615 09/07/21 ?2114 09/08/21 ?0802 09/08/21 ?1145  ?GLUCAP 158* 166* 145* 123* 123*  ? ?Lipid Profile: ?No results for input(s): CHOL, HDL, LDLCALC, TRIG, CHOLHDL, LDLDIRECT in the last 72 hours. ?Thyroid Function Tests: ?No results for input(s): TSH, T4TOTAL, FREET4, T3FREE, THYROIDAB in the last 72 hours. ?Anemia Panel: ?No results for input(s): VITAMINB12, FOLATE, FERRITIN, TIBC, IRON, RETICCTPCT in the last 72 hours. ?Sepsis Labs: ?No results for input(s): PROCALCITON, LATICACIDVEN in the last 168 hours. ? ?Recent Results (from the past 240 hour(s))  ?Surgical PCR screen     Status: None  ? Collection Time: 09/07/21  8:14 AM  ? Specimen: Nasal Mucosa; Nasal Swab  ?Result Value Ref Range Status  ? MRSA, PCR NEGATIVE NEGATIVE Final  ? Staphylococcus aureus NEGATIVE NEGATIVE Final  ?  Comment: (NOTE) ?The Xpert SA Assay (FDA approved for NASAL specimens in patients 52 ?years of age and older), is one component of a comprehensive ?surveillance program. It is not intended to diagnose infection nor to ?guide or monitor treatment. ?Performed at [facility], [place] [address], [place], [place] ?[address] ?  ?  ? ? ? ? ? ?Radiology Studies: ?CARDIAC CATHETERIZATION ? ?Result Date: 09/07/2021 ?  Prox LAD lesion is 30% stenosed.    Mid LAD lesion is 50% stenosed.   Dist LM to Ost LAD lesion is 40% stenosed.   Dist RCA lesion is 100% stenosed. 1.  Patent proximal LAD stent. 2.  Patent RIMA to PDA. 3.  Moderate distal left main and mid LAD disease. 4.  Left heart catheterization was deferred due to presence of mechanical aortic valve replacement. Recommendations: Results reviewed with Dr. Irish Lack.  No intervention was pursued and the patient will be referred for urgent orthopedic surgery.  ? ?ECHOCARDIOGRAM COMPLETE ? ?Result Date: 09/06/2021 ?   ECHOCARDIOGRAM REPORT   Patient Name:   KAELIE HENIGAN Date of Exam: 09/06/2021 Medical Rec #:  [id number]         Height:       62.0 in Accession #:    [id number]        Weight:       171.5 lb Date of Birth:  November 02, 1945         BSA:          1.791 m? Patient Age:    74 years  BP:           108/49 mmHg Patient Gender: F                 HR:           71 bpm. Exam Location:  Inpatient Procedure: 2D Echo, Cardiac Doppler and Color Doppler Indications:    I21.4 NSTEMI  History:        Patient has prior history of Echocardiogram examinations, most                 recent 02/17/2021. CHF, CAD, Prior CABG, Aortic Valve Disease;                 Risk Factors:Hypertension, Diabetes, Dyslipidemia and Family                 History of Coronary Artery Disease. Aortic Valve Replacement                 (2000, St. Jude Mechanical Valve).  Sonographer:    Alvino Chapel RCS Referring Phys: [id number] [place]  1. Left ventricular ejection fraction, by estimation, is 55 to 60%. The left ventricle has normal function. The left ventricle has no regional wall motion abnormalities. There is mild left ventricular hypertrophy. Left ventricular diastolic parameters are consistent with Grade II diastolic dysfunction (pseudonormalization). Elevated left atrial pressure.  2. Right ventricular systolic function is normal. The right ventricular size is normal. There is moderately elevated pulmonary artery systolic  pressure.  3. Left atrial size was mild to moderately dilated.  4. The mitral valve is abnormal. Trivial mitral valve regurgitation. No evidence of mitral stenosis.  5. The tricuspid valve is abnormal.  6. St. Jude

## 2021-09-08 NOTE — Progress Notes (Signed)
ANTICOAGULATION CONSULT NOTE - Follow Up Consult ? ?Pharmacy Consult for heparin ?Indication: mAVR/ACS/Chest pain ? ?Allergies  ?Allergen Reactions  ? Atorvastatin   ?  Muscle pain  ? Dapagliflozin   ?  Other reaction(s): nausea  ? Gabapentin   ?  Other reaction(s): stomach issues  ? Simvastatin Other (See Comments)  ?  Muscle pain  ? Sulfa Antibiotics   ?  unknown  ? Iodinated Contrast Media Rash  ?   Red rash after cardiac cath 1 wk ago, ? Contrast allergy, requires 13 hr prep now per dr.gallerani//a.calhoun ?   ? ? ?Patient Measurements: ?Height: '5\' 2"'$  (355.7 cm) ?Weight: 77.8 kg (171 lb 8.3 oz) ?IBW/kg (Calculated) : 50.1 ?Heparin Dosing Weight: 66.8kg ? ?Vital Signs: ?Temp: 97.5 ?F (36.4 ?C) (04/18 0730) ?Temp Source: Oral (04/18 0730) ?BP: 132/50 (04/18 0730) ?Pulse Rate: 65 (04/18 0730) ? ?Labs: ?Recent Labs  ?  09/06/21 ?0156 09/06/21 ?3220 09/06/21 ?0730 09/06/21 ?1020 09/07/21 ?0428 09/07/21 ?1136 09/08/21 ?0448  ?HGB 9.6*  --   --   --  8.6*  --  8.3*  ?HCT 31.1*  --   --   --  26.7*  --  26.4*  ?PLT 124*  --   --   --  98*  --  130*  ?LABPROT  --   --   --  21.4* 19.7*  --   --   ?INR  --   --   --  1.9* 1.7*  --   --   ?HEPARINUNFRC 0.38  --   --   --  0.41  --  0.31  ?CREATININE  --  1.71*  --   --   --  1.61* 1.69*  ?TROPONINIHS 1,271* 1,473* 1,220* 874*  --   --   --   ? ? ? ?Estimated Creatinine Clearance: 27.4 mL/min (A) (by C-G formula based on SCr of 1.69 mg/dL (H)). ? ? ?Medical History: ?Past Medical History:  ?Diagnosis Date  ? Anemia   ? Anxiety   ? Aortic stenosis   ? a. Now has St Jude mechanical aortic valve (6/00). b. Echo (6/15) with EF 60-65%, mechanical aortic valve with mean gradient 27 mmHg.  ? Arthritis   ? a. Possible c-spine arthritis with pain down left arm.    ? Carotid artery disease ([place])   ? a. Carotid dopplers (7/16) with 40-59% BICA stenosis.    ? Chronic angle-closure glaucoma(365.23)   ? Chronic diastolic CHF (congestive heart failure) ([place])   ? a.  RHC (7/15) with mean  RA 2, PA 22/11, mean PCWP 9, CI 3.79.  ? Contrast media allergy   ? Coronary atherosclerosis of native coronary artery   ? a. CABG at time of AVR in 6/00 with RIMA-RCA. b. Abnl nuc 11/2013 -> LHC (7/15) with patent RIMA-RCA, 80% mRCA, 80% pLAD with FFR 0.73, treated with DES to pLAD.  ? Depression   ? Essential hypertension   ? GERD (gastroesophageal reflux disease)   ? Hyperlipidemia   ? Low back pain   ? Neuromuscular disorder ([place])   ? neuropathy  ? Peripheral vascular disease ([place])   ? a, H/o Right SFA stent. b. Peripheral arterial dopplers (7/16) with right SFA stent patent.    ? PONV (postoperative nausea and vomiting)   ? N&V  ? Type II diabetes mellitus ([place])   ? ? ?Assessment: ?26 YOF presenting with mechanical fall and femur fx, hx of mechanical AVR on warfarin PTA. Warfarin held for heparin drip  with possible need for surgery. Pt s/p LHC with stable CAD - no intervention completed, heparin resumed.  ? ?Heparin level therapeutic at 0.31, CBC stable. ? ?Warfarin PTA regimen: 1.5 mg Tues/Thurs, 3 mg all other days ? ?Goal of Therapy:  ?Heparin level 0.3-0.7 units/ml ?Monitor platelets by anticoagulation protocol: Yes ?  ?Plan:  ?Increase heparin to 1200 units/hr - since on lower end of therapeutic range ?Daily heparin level, CBC ? ? ? ?Arrie Senate, PharmD, BCPS, BCCP ?Clinical Pharmacist ?[phone] ?Please check AMION for all [place] numbers ?09/08/2021 ? ? ?

## 2021-09-08 NOTE — Anesthesia Preprocedure Evaluation (Addendum)
Anesthesia Evaluation  ?Patient identified by MRN, date of birth, ID band ?Patient awake ? ? ? ?Reviewed: ?Allergy & Precautions, NPO status , Patient's Chart, lab work & pertinent test results ? ?History of Anesthesia Complications ?(+) PONV and history of anesthetic complications ? ?Airway ?Mallampati: II ? ?TM Distance: >3 FB ?Neck ROM: Full ? ? ? Dental ? ?(+) Edentulous Upper, Missing, Dental Advisory Given, Chipped, Poor Dentition ?  ?Pulmonary ? ?  ?Pulmonary exam normal ?breath sounds clear to auscultation ? ? ? ? ? ? Cardiovascular ?hypertension, Pt. on home beta blockers ?+ angina + CAD, + Cardiac Stents, + CABG, + Peripheral Vascular Disease and +CHF  ?+ Valvular Problems/Murmurs AS  ?Rhythm:Irregular Rate:Normal ?+ Systolic murmurs ?Echo ??1. Left ventricular ejection fraction, by estimation, is 55 to 60%. The left ventricle has normal function. The left ventricle has no regional wall motion abnormalities. There is mild left ventricular hypertrophy. Left ventricular diastolic parameters are consistent with Grade II diastolic dysfunction (pseudonormalization). Elevated left atrial pressure.  ??2. Right ventricular systolic function is normal. The right ventricular size is normal. There is moderately elevated pulmonary artery systolic pressure.  ??3. Left atrial size was mild to moderately dilated.  ??4. The mitral valve is abnormal. Trivial mitral valve regurgitation. No evidence of mitral stenosis.  ??5. The tricuspid valve is abnormal.  ??6. St. Jude Mechanical Valve unknown size is in the AV position. Mild mean gradient 14 mmHg stable from 2019 study. The aortic valve has been repaired/replaced. Aortic valve regurgitation is not visualized.  ??7. The inferior vena cava is dilated in size with >50% respiratory variability, suggesting right atrial pressure of 8 mmHg.  ?  ?Neuro/Psych ? Headaches, PSYCHIATRIC DISORDERS Anxiety Depression   ? GI/Hepatic ?GERD  ,   ?Endo/Other  ?diabetes, Type 2obese ? Renal/GU ?  ? ?  ?Musculoskeletal ? ?(+) Arthritis ,  ? Abdominal ?  ?Peds ? Hematology ?  ?Anesthesia Other Findings ? ? Reproductive/Obstetrics ? ?  ? ? ? ? ? ? ? ? ? ? ? ? ? ?  ?  ? ? ? ? ? ? ? ?Anesthesia Physical ? ?Anesthesia Plan ? ?ASA: 3 ? ?Anesthesia Plan: General  ? ?Post-op Pain Management: Tylenol PO (pre-op)* and Regional block*  ? ?Induction: Intravenous ? ?PONV Risk Score and Plan: 4 or greater and Ondansetron, Treatment may vary due to age or medical condition, Dexamethasone and Propofol infusion ? ?Airway Management Planned: LMA ? ?Additional Equipment:  ? ?Intra-op Plan:  ? ?Post-operative Plan: Extubation in OR ? ?Informed Consent: I have reviewed the patients History and Physical, chart, labs and discussed the procedure including the risks, benefits and alternatives for the proposed anesthesia with the patient or authorized representative who has indicated his/her understanding and acceptance.  ? ? ? ?Dental advisory given ? ?Plan Discussed with: CRNA ? ?Anesthesia Plan Comments:   ? ? ? ? ?Anesthesia Quick Evaluation ? ?

## 2021-09-09 ENCOUNTER — Inpatient Hospital Stay (HOSPITAL_COMMUNITY): Payer: Medicare Other

## 2021-09-09 ENCOUNTER — Other Ambulatory Visit: Payer: Self-pay

## 2021-09-09 ENCOUNTER — Inpatient Hospital Stay (HOSPITAL_COMMUNITY): Payer: Medicare Other | Admitting: Anesthesiology

## 2021-09-09 ENCOUNTER — Encounter (HOSPITAL_COMMUNITY)
Admission: EM | Disposition: A | Discharge status: Home Health | Payer: Self-pay | Source: Home / Self Care | Attending: Internal Medicine

## 2021-09-09 ENCOUNTER — Other Ambulatory Visit: Payer: Self-pay | Admitting: Medical

## 2021-09-09 DIAGNOSIS — S72462A Displaced supracondylar fracture with intracondylar extension of lower end of left femur, initial encounter for closed fracture: Secondary | ICD-10-CM

## 2021-09-09 DIAGNOSIS — F419 Anxiety disorder, unspecified: Secondary | ICD-10-CM

## 2021-09-09 DIAGNOSIS — I25119 Atherosclerotic heart disease of native coronary artery with unspecified angina pectoris: Secondary | ICD-10-CM

## 2021-09-09 DIAGNOSIS — S72422A Displaced fracture of lateral condyle of left femur, initial encounter for closed fracture: Secondary | ICD-10-CM | POA: Diagnosis not present

## 2021-09-09 DIAGNOSIS — S72432A Displaced fracture of medial condyle of left femur, initial encounter for closed fracture: Secondary | ICD-10-CM | POA: Diagnosis not present

## 2021-09-09 DIAGNOSIS — N179 Acute kidney failure, unspecified: Secondary | ICD-10-CM

## 2021-09-09 DIAGNOSIS — I110 Hypertensive heart disease with heart failure: Secondary | ICD-10-CM

## 2021-09-09 DIAGNOSIS — I509 Heart failure, unspecified: Secondary | ICD-10-CM

## 2021-09-09 HISTORY — PX: ORIF FEMUR FRACTURE: SHX2119

## 2021-09-09 LAB — BASIC METABOLIC PANEL WITH GFR
Anion gap: 8 (ref 5–15)
BUN: 46 mg/dL — ABNORMAL HIGH (ref 8–23)
CO2: 24 mmol/L (ref 22–32)
Calcium: 8.9 mg/dL (ref 8.9–10.3)
Chloride: 105 mmol/L (ref 98–111)
Creatinine, Ser: 2.05 mg/dL — ABNORMAL HIGH (ref 0.44–1.00)
GFR, Estimated: 25 mL/min — ABNORMAL LOW
Glucose, Bld: 133 mg/dL — ABNORMAL HIGH (ref 70–99)
Potassium: 3.9 mmol/L (ref 3.5–5.1)
Sodium: 137 mmol/L (ref 135–145)

## 2021-09-09 LAB — GLUCOSE, CAPILLARY
Glucose-Capillary: 122 mg/dL — ABNORMAL HIGH (ref 70–99)
Glucose-Capillary: 154 mg/dL — ABNORMAL HIGH (ref 70–99)
Glucose-Capillary: 158 mg/dL — ABNORMAL HIGH (ref 70–99)
Glucose-Capillary: 163 mg/dL — ABNORMAL HIGH (ref 70–99)
Glucose-Capillary: 241 mg/dL — ABNORMAL HIGH (ref 70–99)

## 2021-09-09 LAB — CBC
HCT: 26.3 % — ABNORMAL LOW (ref 36.0–46.0)
Hemoglobin: 8.3 g/dL — ABNORMAL LOW (ref 12.0–15.0)
MCH: 27.6 pg (ref 26.0–34.0)
MCHC: 31.6 g/dL (ref 30.0–36.0)
MCV: 87.4 fL (ref 80.0–100.0)
Platelets: 147 10*3/uL — ABNORMAL LOW (ref 150–400)
RBC: 3.01 MIL/uL — ABNORMAL LOW (ref 3.87–5.11)
RDW: 17.5 % — ABNORMAL HIGH (ref 11.5–15.5)
WBC: 9.1 10*3/uL (ref 4.0–10.5)
nRBC: 0.2 % (ref 0.0–0.2)

## 2021-09-09 LAB — HEPARIN LEVEL (UNFRACTIONATED): Heparin Unfractionated: 0.19 IU/mL — ABNORMAL LOW (ref 0.30–0.70)

## 2021-09-09 LAB — PROTIME-INR
INR: 1.6 — ABNORMAL HIGH (ref 0.8–1.2)
Prothrombin Time: 19 seconds — ABNORMAL HIGH (ref 11.4–15.2)

## 2021-09-09 LAB — TYPE AND SCREEN
ABO/RH(D): O POS
Antibody Screen: NEGATIVE

## 2021-09-09 LAB — ABO/RH: ABO/RH(D): O POS

## 2021-09-09 LAB — VITAMIN D 25 HYDROXY (VIT D DEFICIENCY, FRACTURES): Vit D, 25-Hydroxy: 35.61 ng/mL (ref 30–100)

## 2021-09-09 SURGERY — OPEN REDUCTION INTERNAL FIXATION (ORIF) DISTAL FEMUR FRACTURE
Anesthesia: General | Site: Leg Upper | Laterality: Left

## 2021-09-09 MED ORDER — MIDAZOLAM HCL 5 MG/5ML IJ SOLN
INTRAMUSCULAR | Status: DC | PRN
  Administered 2021-09-09: 1 mg via INTRAVENOUS

## 2021-09-09 MED ORDER — FENTANYL CITRATE (PF) 250 MCG/5ML IJ SOLN
INTRAMUSCULAR | Status: AC
  Filled 2021-09-09: qty 5

## 2021-09-09 MED ORDER — FENTANYL CITRATE PF 50 MCG/ML IJ SOSY
12.5000 ug | PREFILLED_SYRINGE | INTRAMUSCULAR | Status: DC | PRN
  Administered 2021-09-09 – 2021-09-10 (×3): 12.5 ug via INTRAVENOUS
  Filled 2021-09-09 (×3): qty 1

## 2021-09-09 MED ORDER — CEFAZOLIN SODIUM-DEXTROSE 2-4 GM/100ML-% IV SOLN
2.0000 g | Freq: Three times a day (TID) | INTRAVENOUS | Status: AC
  Administered 2021-09-09 (×2): 2 g via INTRAVENOUS
  Filled 2021-09-09 (×3): qty 100

## 2021-09-09 MED ORDER — PHENYLEPHRINE 80 MCG/ML (10ML) SYRINGE FOR IV PUSH (FOR BLOOD PRESSURE SUPPORT)
PREFILLED_SYRINGE | INTRAVENOUS | Status: DC | PRN
  Administered 2021-09-09 (×3): 40 ug via INTRAVENOUS

## 2021-09-09 MED ORDER — ROCURONIUM BROMIDE 10 MG/ML (PF) SYRINGE
PREFILLED_SYRINGE | INTRAVENOUS | Status: DC | PRN
  Administered 2021-09-09: 40 mg via INTRAVENOUS

## 2021-09-09 MED ORDER — ONDANSETRON HCL 4 MG/2ML IJ SOLN: 4.0000 mg | Freq: Four times a day (QID) | INTRAMUSCULAR | Status: DC | PRN

## 2021-09-09 MED ORDER — SUGAMMADEX SODIUM 200 MG/2ML IV SOLN
INTRAVENOUS | Status: DC | PRN
  Administered 2021-09-09 (×2): 200 mg via INTRAVENOUS

## 2021-09-09 MED ORDER — ACETAMINOPHEN 500 MG PO TABS
1000.0000 mg | ORAL_TABLET | Freq: Once | ORAL | Status: AC
  Administered 2021-09-09: 1000 mg via ORAL
  Filled 2021-09-09: qty 2

## 2021-09-09 MED ORDER — DOCUSATE SODIUM 100 MG PO CAPS
100.0000 mg | ORAL_CAPSULE | Freq: Two times a day (BID) | ORAL | Status: DC
  Administered 2021-09-09: 100 mg via ORAL
  Filled 2021-09-09 (×5): qty 1

## 2021-09-09 MED ORDER — MIDAZOLAM HCL 2 MG/2ML IJ SOLN
INTRAMUSCULAR | Status: AC
  Filled 2021-09-09: qty 2

## 2021-09-09 MED ORDER — EPHEDRINE SULFATE-NACL 50-0.9 MG/10ML-% IV SOSY
PREFILLED_SYRINGE | INTRAVENOUS | Status: DC | PRN
  Administered 2021-09-09: 5 mg via INTRAVENOUS

## 2021-09-09 MED ORDER — FENTANYL CITRATE (PF) 100 MCG/2ML IJ SOLN
INTRAMUSCULAR | Status: AC
  Filled 2021-09-09: qty 2

## 2021-09-09 MED ORDER — OXYCODONE HCL 5 MG PO TABS
5.0000 mg | ORAL_TABLET | ORAL | Status: DC | PRN
  Administered 2021-09-09 – 2021-09-10 (×3): 10 mg via ORAL
  Administered 2021-09-10: 5 mg via ORAL
  Administered 2021-09-10: 10 mg via ORAL
  Administered 2021-09-10 – 2021-09-11 (×2): 5 mg via ORAL
  Administered 2021-09-11 – 2021-09-12 (×2): 10 mg via ORAL
  Filled 2021-09-09: qty 1
  Filled 2021-09-09 (×2): qty 2
  Filled 2021-09-09: qty 1
  Filled 2021-09-09 (×4): qty 2
  Filled 2021-09-09: qty 1

## 2021-09-09 MED ORDER — PROPOFOL 10 MG/ML IV BOLUS
INTRAVENOUS | Status: AC
  Filled 2021-09-09: qty 20

## 2021-09-09 MED ORDER — LACTATED RINGERS IV SOLN: INTRAVENOUS | Status: DC | PRN

## 2021-09-09 MED ORDER — METOCLOPRAMIDE HCL 5 MG PO TABS: 5.0000 mg | ORAL_TABLET | Freq: Three times a day (TID) | ORAL | Status: DC | PRN

## 2021-09-09 MED ORDER — VANCOMYCIN HCL 1000 MG IV SOLR
INTRAVENOUS | Status: DC | PRN
  Administered 2021-09-09: 1000 mg

## 2021-09-09 MED ORDER — CHLORHEXIDINE GLUCONATE 0.12 % MT SOLN
OROMUCOSAL | Status: AC
  Filled 2021-09-09: qty 15

## 2021-09-09 MED ORDER — DROPERIDOL 2.5 MG/ML IJ SOLN
0.6250 mg | Freq: Once | INTRAMUSCULAR | Status: AC | PRN
  Administered 2021-09-09: 0.625 mg via INTRAVENOUS

## 2021-09-09 MED ORDER — DROPERIDOL 2.5 MG/ML IJ SOLN
INTRAMUSCULAR | Status: AC
  Filled 2021-09-09: qty 2

## 2021-09-09 MED ORDER — FENTANYL CITRATE (PF) 100 MCG/2ML IJ SOLN
25.0000 ug | INTRAMUSCULAR | Status: DC | PRN
  Administered 2021-09-09 (×3): 50 ug via INTRAVENOUS

## 2021-09-09 MED ORDER — ONDANSETRON HCL 4 MG/2ML IJ SOLN
INTRAMUSCULAR | Status: DC | PRN
  Administered 2021-09-09: 4 mg via INTRAVENOUS

## 2021-09-09 MED ORDER — PROPOFOL 10 MG/ML IV BOLUS
INTRAVENOUS | Status: DC | PRN
  Administered 2021-09-09: 70 mg via INTRAVENOUS
  Administered 2021-09-09: 30 mg via INTRAVENOUS

## 2021-09-09 MED ORDER — LIDOCAINE 2% (20 MG/ML) 5 ML SYRINGE
INTRAMUSCULAR | Status: DC | PRN
  Administered 2021-09-09: 40 mg via INTRAVENOUS

## 2021-09-09 MED ORDER — SODIUM CHLORIDE 0.9 % IV SOLN: INTRAVENOUS | Status: DC

## 2021-09-09 MED ORDER — METOCLOPRAMIDE HCL 5 MG/ML IJ SOLN: 5.0000 mg | Freq: Three times a day (TID) | INTRAMUSCULAR | Status: DC | PRN

## 2021-09-09 MED ORDER — FENTANYL CITRATE (PF) 100 MCG/2ML IJ SOLN
INTRAMUSCULAR | Status: DC | PRN
  Administered 2021-09-09 (×2): 50 ug via INTRAVENOUS

## 2021-09-09 MED ORDER — 0.9 % SODIUM CHLORIDE (POUR BTL) OPTIME
TOPICAL | Status: DC | PRN
  Administered 2021-09-09: 1000 mL

## 2021-09-09 MED ORDER — ACETAMINOPHEN 325 MG PO TABS
650.0000 mg | ORAL_TABLET | Freq: Four times a day (QID) | ORAL | Status: DC
  Administered 2021-09-09 – 2021-09-12 (×10): 650 mg via ORAL
  Filled 2021-09-09 (×10): qty 2

## 2021-09-09 MED ORDER — VANCOMYCIN HCL 1000 MG IV SOLR
INTRAVENOUS | Status: AC
  Filled 2021-09-09: qty 20

## 2021-09-09 MED ORDER — ONDANSETRON HCL 4 MG PO TABS: 4.0000 mg | ORAL_TABLET | Freq: Four times a day (QID) | ORAL | Status: DC | PRN

## 2021-09-09 SURGICAL SUPPLY — 66 items
ADH SKN CLS APL DERMABOND .7 (GAUZE/BANDAGES/DRESSINGS) ×2
APL PRP STRL LF DISP 70% ISPRP (MISCELLANEOUS) ×1
BAG COUNTER SPONGE SURGICOUNT (BAG) ×3 IMPLANT
BAG SPNG CNTER NS LX DISP (BAG) ×1
BIT DRILL 4.3 (BIT) ×2
BIT DRILL 4.3X300MM (BIT) IMPLANT
BIT DRILL LONG 3.3 (BIT) ×1 IMPLANT
BIT DRILL QC 3.3X195 (BIT) ×1 IMPLANT
BLADE CLIPPER SURG (BLADE) IMPLANT
BNDG ELASTIC 4X5.8 VLCR STR LF (GAUZE/BANDAGES/DRESSINGS) ×1 IMPLANT
BNDG ELASTIC 6X5.8 VLCR STR LF (GAUZE/BANDAGES/DRESSINGS) ×1 IMPLANT
BRUSH SCRUB EZ PLAIN DRY (MISCELLANEOUS) ×6 IMPLANT
CANISTER SUCT 3000ML PPV (MISCELLANEOUS) ×3 IMPLANT
CAP LOCK NCB (Cap) ×8 IMPLANT
CHLORAPREP W/TINT 26 (MISCELLANEOUS) ×3 IMPLANT
COVER SURGICAL LIGHT HANDLE (MISCELLANEOUS) ×3 IMPLANT
DERMABOND ADVANCED (GAUZE/BANDAGES/DRESSINGS) ×2
DERMABOND ADVANCED .7 DNX12 (GAUZE/BANDAGES/DRESSINGS) IMPLANT
DRAPE C-ARM 42X72 X-RAY (DRAPES) ×3 IMPLANT
DRAPE C-ARMOR (DRAPES) ×3 IMPLANT
DRAPE HALF SHEET 40X57 (DRAPES) ×6 IMPLANT
DRAPE ORTHO SPLIT 77X108 STRL (DRAPES) ×4
DRAPE SURG 17X23 STRL (DRAPES) ×3 IMPLANT
DRAPE SURG ORHT 6 SPLT 77X108 (DRAPES) ×4 IMPLANT
DRAPE U-SHAPE 47X51 STRL (DRAPES) ×3 IMPLANT
ELECT REM PT RETURN 9FT ADLT (ELECTROSURGICAL) ×2
ELECTRODE REM PT RTRN 9FT ADLT (ELECTROSURGICAL) ×2 IMPLANT
GLOVE BIO SURGEON STRL SZ 6.5 (GLOVE) ×9 IMPLANT
GLOVE BIO SURGEON STRL SZ7.5 (GLOVE) ×12 IMPLANT
GLOVE BIOGEL PI IND STRL 6.5 (GLOVE) ×2 IMPLANT
GLOVE BIOGEL PI IND STRL 7.5 (GLOVE) ×2 IMPLANT
GLOVE BIOGEL PI INDICATOR 6.5 (GLOVE) ×1
GLOVE BIOGEL PI INDICATOR 7.5 (GLOVE) ×1
GOWN STRL REUS W/ TWL LRG LVL3 (GOWN DISPOSABLE) ×6 IMPLANT
GOWN STRL REUS W/TWL LRG LVL3 (GOWN DISPOSABLE) ×6
K-WIRE 2.0 (WIRE) ×6
K-WIRE FXSTD 280X2XNS SS (WIRE) ×3
KIT BASIN OR (CUSTOM PROCEDURE TRAY) ×3 IMPLANT
KIT TURNOVER KIT B (KITS) ×3 IMPLANT
KWIRE FXSTD 280X2XNS SS (WIRE) IMPLANT
NS IRRIG 1000ML POUR BTL (IV SOLUTION) ×3 IMPLANT
PACK TOTAL JOINT (CUSTOM PROCEDURE TRAY) ×3 IMPLANT
PAD ARMBOARD 7.5X6 YLW CONV (MISCELLANEOUS) ×6 IMPLANT
PAD CAST 4YDX4 CTTN HI CHSV (CAST SUPPLIES) IMPLANT
PADDING CAST COTTON 4X4 STRL (CAST SUPPLIES) ×2
PADDING CAST COTTON 6X4 STRL (CAST SUPPLIES) ×1 IMPLANT
PLATE DIST FEM 12H (Plate) ×1 IMPLANT
SCREW 5.0 80MM (Screw) ×4 IMPLANT
SCREW NCB 4.0MX34M (Screw) ×1 IMPLANT
SCREW NCB 5.0X34MM (Screw) ×1 IMPLANT
SCREW NCB 5.0X36MM (Screw) ×1 IMPLANT
SCREW NCB 5.0X85MM (Screw) ×1 IMPLANT
SPONGE T-LAP 18X18 ~~LOC~~+RFID (SPONGE) IMPLANT
STAPLER VISISTAT 35W (STAPLE) ×3 IMPLANT
SUCTION FRAZIER HANDLE 10FR (MISCELLANEOUS) ×2
SUCTION TUBE FRAZIER 10FR DISP (MISCELLANEOUS) ×2 IMPLANT
SUT ETHILON 3 0 PS 1 (SUTURE) ×6 IMPLANT
SUT MNCRL AB 3-0 PS2 27 (SUTURE) ×2 IMPLANT
SUT VIC AB 0 CT1 27 (SUTURE)
SUT VIC AB 0 CT1 27XBRD ANBCTR (SUTURE) IMPLANT
SUT VIC AB 1 CT1 27 (SUTURE)
SUT VIC AB 1 CT1 27XBRD ANBCTR (SUTURE) IMPLANT
SUT VIC AB 2-0 CT1 27 (SUTURE) ×4
SUT VIC AB 2-0 CT1 TAPERPNT 27 (SUTURE) ×4 IMPLANT
TOWEL GREEN STERILE (TOWEL DISPOSABLE) ×6 IMPLANT
TRAY FOLEY MTR SLVR 16FR STAT (SET/KITS/TRAYS/PACK) IMPLANT

## 2021-09-09 NOTE — Assessment & Plan Note (Addendum)
Appears euvolemic ?-Resume torsemide ?

## 2021-09-09 NOTE — Progress Notes (Signed)
Pt came to short stay with a nail file, upper dentures and bilateral hearing aids in [place] case. RN caring for 6E-11 aware and will come get pt belongings to bring back to pt room. All belongings labeled with pt label.  ?

## 2021-09-09 NOTE — H&P (View-Only) (Signed)
Orthopaedic Trauma Service (OTS) Consult  ? ?Patient ID: ?Abigail Oliver ?MRN: [id number] ?DOB/AGE: 08/18/1945 76 y.o. ? ?Reason for Consult:Left distal femur fracture  ?Referring Physician: Dr. Frank Alusio, MD [facility] ? ?HPI: Abigail Oliver is an 76 y.o. female who is being seen in consultation at the request of Dr. Luzier for evaluation of left distal femur fracture.  The patient had tripped and fallen in her home landing on her knee.  She had immediate pain and inability to bear weight.  She was brought to the emergency room x-ray showed a distal femur fracture.  CT scan showed intra-articular extension.  Due to the complexity of her injury it was felt to need an orthopedic traumatologist to take over.  She has been in the hospital awaiting her INR to return to a appropriate level and for cardiac clearance. ? ?Patient was seen and evaluated in the preoperative holding area.  Currently comfortable.  She has received some medication but is able to answer questions appropriately.  She lives at home with her husband.  She ambulates without a walker or cane at baseline.  The knee has not been giving her much problems previously.  She does have a history of neuropathy and is on metformin for type 2 diabetes.  She is on Coumadin for atrial fibrillation for which she has a current INR of 1.6 this morning.  She does state that she gets occasional sores on her feet from her neuropathy. ? ?Past Medical History:  ?Diagnosis Date  ? Anemia   ? Anxiety   ? Aortic stenosis   ? a. Now has St Jude mechanical aortic valve (6/00). b. Echo (6/15) with EF 60-65%, mechanical aortic valve with mean gradient 27 mmHg.  ? Arthritis   ? a. Possible c-spine arthritis with pain down left arm.    ? Carotid artery disease ([facility])   ? a. Carotid dopplers (7/16) with 40-59% BICA stenosis.    ? Chronic angle-closure glaucoma(365.23)   ? Chronic diastolic CHF (congestive heart failure) ([facility])   ? a.  RHC (7/15) with mean RA 2, PA 22/11, mean  PCWP 9, CI 3.79.  ? Contrast media allergy   ? Coronary atherosclerosis of native coronary artery   ? a. CABG at time of AVR in 6/00 with RIMA-RCA. b. Abnl nuc 11/2013 -> LHC (7/15) with patent RIMA-RCA, 80% mRCA, 80% pLAD with FFR 0.73, treated with DES to pLAD.  ? Depression   ? Essential hypertension   ? GERD (gastroesophageal reflux disease)   ? Hyperlipidemia   ? Low back pain   ? Neuromuscular disorder ([facility])   ? neuropathy  ? Peripheral vascular disease ([facility])   ? a, H/o Right SFA stent. b. Peripheral arterial dopplers (7/16) with right SFA stent patent.    ? PONV (postoperative nausea and vomiting)   ? N&V  ? Type II diabetes mellitus ([facility])   ? ? ?Past Surgical History:  ?Procedure Laterality Date  ? BILATERAL OOPHORECTOMY  05/24/1985  ? BREAST BIOPSY  05/24/2010  ? left  ? CARDIAC CATHETERIZATION  01/14/1999  ? normal LV function, severe aortic stenosis; 80% and 70% stenosis in RCA; mild 20% distal norrowing in L main with 20% proximal LAD stenosis, 40% diagonal stenosis and 20% proximal circumflex stenosis  ? CARDIAC VALVE REPLACEMENT  05/24/1998  ? aortic valve   ? CARDIOVASCULAR STRESS TEST  08/25/2011  ? R/L MV - EF 72%; no scintigraphic evidence of inducible MI; normal perfusionTID of 1.25 elevated - could indicate   small vessle subendocardial ischemia; EKG NSR at 66, non diagnostic for ischemia  ? CARPAL TUNNEL RELEASE  05/25/1987  ? bilaterally  ? CATARACT EXTRACTION W/ INTRAOCULAR LENS  IMPLANT, BILATERAL  ~ 2007  ? CESAREAN SECTION  1969; 1971  ? CHOLECYSTECTOMY  05/24/1988  ? COLONOSCOPY N/A 10/27/2012  ? Procedure: COLONOSCOPY;  Surgeon: Patrick D Hung, MD;  Location: [facility];  Service: Endoscopy;  Laterality: N/A;  ? CORONARY ARTERY BYPASS GRAFT  05/24/1998  ? RIMA to RCA  ? CORONARY/GRAFT ANGIOGRAPHY N/A 09/07/2021  ? Procedure: CORONARY/GRAFT ANGIOGRAPHY;  Surgeon: Thukkani, Arun K, MD;  Location: [facility];  Service: Cardiovascular;  Laterality: N/A;  ? DOPPLER ECHOCARDIOGRAPHY   03/29/2012  ? EF >55%; mild concentric LVH; stage 1 diastolic dysfunction, elevated LV filling pressure, dilated LA; MAC mild MR; St Jude AVR peak and mean gradients of 33mmHg and 22mmHg; transvalvular gradients have increased (prev 23 and 14 respectively)  ? ESOPHAGOGASTRODUODENOSCOPY N/A 10/27/2012  ? Procedure: ESOPHAGOGASTRODUODENOSCOPY (EGD);  Surgeon: Patrick D Hung, MD;  Location: [facility];  Service: Endoscopy;  Laterality: N/A;  ? FEMORAL ARTERY STENT  09/20/2011  ? 6 x 40 Smart Nitinol self-expanding stent placed;  10/15/2031 -R SFA stent open and patent w/o evidence of restenosis  ? FRACTIONAL FLOW RESERVE WIRE  12/14/2013  ? Procedure: FRACTIONAL FLOW RESERVE WIRE;  Surgeon: Dalton S McLean, MD;  Location: [facility];  Service: Cardiovascular;;  ? KNEE SURGERY Left   ? LACERATION REPAIR    ? right hand  ? LEFT AND RIGHT HEART CATHETERIZATION WITH CORONARY ANGIOGRAM N/A 12/14/2013  ? Procedure: LEFT AND RIGHT HEART CATHETERIZATION WITH CORONARY ANGIOGRAM;  Surgeon: Dalton S McLean, MD;  Location: [facility];  Service: Cardiovascular;  Laterality: N/A;  ? LOWER EXTREMITY ANGIOGRAM N/A 09/20/2011  ? Procedure: LOWER EXTREMITY ANGIOGRAM;  Surgeon: Jonathan J Berry, MD;  Location: [facility];  Service: Cardiovascular;  Laterality: N/A;  ? LYMPH NODE BIOPSY  06/25/2011  ? "core needle on 5"  ? needle biopsy  05/25/2007  ? "on ankles for nerve damage"  ? NEUROPLASTY / TRANSPOSITION ULNAR NERVE AT ELBOW    ? right  ? PERCUTANEOUS CORONARY STENT INTERVENTION (PCI-S)  12/14/2013  ? Procedure: PERCUTANEOUS CORONARY STENT INTERVENTION (PCI-S);  Surgeon: Dalton S McLean, MD;  Location: [facility];  Service: Cardiovascular;;  Prox LAD 3.00x12 Promus DES ?  ? PERIPHERAL ARTERIAL STENT GRAFT  09/20/2011  ? right SFA  ? REFRACTIVE SURGERY  ` 2004  ? "for glaucoma"  ? TONSILLECTOMY  05/24/1966  ? TRIGGER FINGER RELEASE Left 12/04/2015  ? Procedure: LEFT RING FINGER TRIGGER RELEASE;  Surgeon: Yomaris Palecek Kuzma, MD;   Location: [facility];  Service: Orthopedics;  Laterality: Left;  ? VAGINAL HYSTERECTOMY  05/25/1983  ? Fibroids  ? ? ?Family History  ?Problem Relation Age of Onset  ? Colon cancer Mother   ? COPD Mother   ? Emphysema Mother   ? Cancer Maternal Grandmother   ? Cancer Maternal Grandfather   ? Heart disease Brother   ? Diabetes Neg Hx   ? ? ?Social History:  reports that she has never smoked. She has never used smokeless tobacco. She reports that she does not drink alcohol and does not use drugs. ? ?Allergies:  ?Allergies  ?Allergen Reactions  ? Atorvastatin   ?  Muscle pain  ? Dapagliflozin   ?  Other reaction(s): nausea  ? Gabapentin   ?  Other reaction(s): stomach issues  ?   Simvastatin Other (See Comments)  ?  Muscle pain  ? Sulfa Antibiotics   ?  unknown  ? Iodinated Contrast Media Rash  ?   Red rash after cardiac cath 1 wk ago, ? Contrast allergy, requires 13 hr prep now per dr.[email or web address]//a.calhoun ?   ? ? ?Medications:  ?No current facility-administered medications on file prior to encounter.  ? ?Current Outpatient Medications on File Prior to Encounter  ?Medication Sig Dispense Refill  ? aspirin EC 81 MG tablet Take 81 mg by mouth every evening.    ? Capsaicin-Menthol (SALONPAS GEL-PATCH HOT EX) Apply 1 patch topically daily as needed (pain).    ? Cholecalciferol (VITAMIN D) 125 MCG (5000 UT) CAPS Take 5,000 Units by mouth daily.    ? ferrous sulfate 325 (65 FE) MG tablet Take 1 tablet (325 mg total) by mouth daily. 30 tablet 0  ? folic acid (FOLVITE) 1 MG tablet Take 1 mg by mouth daily.    ? HYDROcodone-acetaminophen (NORCO/VICODIN) 5-325 MG tablet Take 1 tablet by mouth every 6 (six) hours as needed for moderate pain.    ? latanoprost (XALATAN) 0.005 % ophthalmic solution PLACE 1 DROP INTO BOTH EYES AT BEDTIME. (Patient taking differently: Place 1 drop into both eyes at bedtime.) 2.5 mL 2  ? Menthol, Topical Analgesic, (BIOFREEZE) 4 % GEL Apply 1 application. topically daily as needed (pain).     ? metFORMIN (GLUCOPHAGE) 1000 MG tablet Take 1,000 mg by mouth daily with breakfast.     ? metoprolol succinate (TOPROL-XL) 50 MG 24 hr tablet Take 1 tablet (50 mg total) by mouth daily. NEEDS FOLLOW UP APP

## 2021-09-09 NOTE — Progress Notes (Signed)
?  Progress Note ? ? ?Patient: Abigail Oliver WYO:[id number] DOB: [birth year] DOA: 09/04/2021     5 ?DOS: the patient was seen and examined on 09/09/2021 at 12:33PM ?  ? ? ? ?Brief hospital course: ?Mrs. Sponsel is a 76 yo. F with CAD s/p CABG, PCI last 2015, hx AVR on warfarin, DCHF, HTN, HLD, and DM who prsented with fall, found to have  left intercondylar fracture of femur with intra-articular extension.  Admitted for surgical intervention. ? ?While in the hospital, developed chest discomfort, elevated troponins.  Subsequent cardiac cath showed minimal obstruction, Cardiology optimized medications and cleared for surgery. ? ? ? ? ?Assessment and Plan: ?* Closed bicondylar fracture of distal femur ([place]) ?- Consult Ortho, appreciate recommendations ?- Consult PT ? ?NSTEMI (non-ST elevated myocardial infarction) ([place]) ?-Continue aspirin, metoprolol, Crestor ? ?AKI (acute kidney injury) ([place]) ?Cr up to >2 today. ?- Hold diuretics and nephrotoxins ?- IV fluids ?- Trend Cr ? ?Anxiety ?-Continue sertraline ? ?Essential hypertension ?BP controlled ?-Continue metoprolol ? ?Chronic diastolic heart failure ([place]) ?Appears euvolemic ?-Hold diuretics given AKI ? ?CAD, CABG 2000, low risk Myoview April 2011; 2015 + myoview ?-Continue aspirin, Crestor, metoprolol ? ?Dyslipidemia ?-Continue Crestory ? ?H/O aortic valve replacement ?-Continue bridging heparin as soon as safe to resume post-op ? ?Type II diabetes mellitus with peripheral autonomic neuropathy (HCC) ?Glucose good ?-Continue SS corrections ?-Hold metformin ? ? ? ? ? ? ? ? ? ?Subjective: Patient has some left knee pain postop, she feels a little bit weak and slightly disoriented coming up from anesthesia.  She has no headache, chest pain, dyspnea, vomiting, nausea. ? ? ? ? ?Physical Exam: ?Vitals:  ? 09/09/21 1055 09/09/21 1111 09/09/21 1130 09/09/21 1611  ?BP: 137/62 (!) 141/56 (!) 141/54   ?Pulse: 81 77 90 68  ?Resp: '17 12 17 15  '$ ?Temp: (!) 97.2 ?F (36.2 ?C)  98.6 ?F (37  ?C)   ?TempSrc:      ?SpO2: 98% 93% 93% 94%  ?Weight:      ?Height:      ? ?Elderly adult female, lying in bed, appears tired, but makes eye contact and is interactive ?RRR, no murmurs, no lower extremity edema ?Respiratory rate is normal, lungs clear without rales or wheezes ?Abdomen soft no tenderness palpation or guarding, no masses ?Left leg has a dressing in place, no obvious deformity ?Attention slightly diminished, affect blunted postanesthesia, face symmetric, speech fluent, moves upper extremities with normal strength and coordination ? ?Data Reviewed: ?Cardiology notes reviewed, perioperative notes reviewed, vital signs reviewed ?Labs are notable for creatinine up to 2, hemoglobin 8.3, no change from previous ? ?Family Communication:   ? ? ? ?Disposition: ?Status is: Inpatient ?Remains inpatient appropriate because:  ?She has had operative repair of a left femoral fracture. ? ?She will need PT evaluation, and likely placement ? ? ? ? ? ? ? ?Author: ?Edwin Dada, MD ?09/09/2021 6:02 PM ? ?For on call review [email or web address].  ? ? ?

## 2021-09-09 NOTE — Consult Note (Signed)
Orthopaedic Trauma Service (OTS) Consult  ? ?Patient ID: ?Abigail Oliver ?MRN: [id number] ?DOB/AGE: December 02, 1945 76 y.o. ? ?Reason for Consult:Left distal femur fracture  ?Referring Physician: Dr. Hector Shade, MD [facility] ? ?HPI: Abigail Oliver is an 76 y.o. female who is being seen in consultation at the request of Dr. Hazle Quant for evaluation of left distal femur fracture.  The patient had tripped and fallen in her home landing on her knee.  She had immediate pain and inability to bear weight.  She was brought to the emergency room x-ray showed a distal femur fracture.  CT scan showed intra-articular extension.  Due to the complexity of her injury it was felt to need an orthopedic traumatologist to take over.  She has been in the hospital awaiting her INR to return to a appropriate level and for cardiac clearance. ? ?Patient was seen and evaluated in the preoperative holding area.  Currently comfortable.  She has received some medication but is able to answer questions appropriately.  She lives at home with her husband.  She ambulates without a walker or cane at baseline.  The knee has not been giving her much problems previously.  She does have a history of neuropathy and is on metformin for type 2 diabetes.  She is on Coumadin for atrial fibrillation for which she has a current INR of 1.6 this morning.  She does state that she gets occasional sores on her feet from her neuropathy. ? ?Past Medical History:  ?Diagnosis Date  ? Anemia   ? Anxiety   ? Aortic stenosis   ? a. Now has St Jude mechanical aortic valve (6/00). b. Echo (6/15) with EF 60-65%, mechanical aortic valve with mean gradient 27 mmHg.  ? Arthritis   ? a. Possible c-spine arthritis with pain down left arm.    ? Carotid artery disease ([place])   ? a. Carotid dopplers (7/16) with 40-59% BICA stenosis.    ? Chronic angle-closure glaucoma(365.23)   ? Chronic diastolic CHF (congestive heart failure) ([place])   ? a.  RHC (7/15) with mean RA 2, PA 22/11, mean  PCWP 9, CI 3.79.  ? Contrast media allergy   ? Coronary atherosclerosis of native coronary artery   ? a. CABG at time of AVR in 6/00 with RIMA-RCA. b. Abnl nuc 11/2013 -> LHC (7/15) with patent RIMA-RCA, 80% mRCA, 80% pLAD with FFR 0.73, treated with DES to pLAD.  ? Depression   ? Essential hypertension   ? GERD (gastroesophageal reflux disease)   ? Hyperlipidemia   ? Low back pain   ? Neuromuscular disorder ([place])   ? neuropathy  ? Peripheral vascular disease ([place])   ? a, H/o Right SFA stent. b. Peripheral arterial dopplers (7/16) with right SFA stent patent.    ? PONV (postoperative nausea and vomiting)   ? N&V  ? Type II diabetes mellitus ([place])   ? ? ?Past Surgical History:  ?Procedure Laterality Date  ? BILATERAL OOPHORECTOMY  05/24/1985  ? BREAST BIOPSY  05/24/2010  ? left  ? CARDIAC CATHETERIZATION  01/14/1999  ? normal LV function, severe aortic stenosis; 80% and 70% stenosis in RCA; mild 20% distal norrowing in L main with 20% proximal LAD stenosis, 40% diagonal stenosis and 20% proximal circumflex stenosis  ? CARDIAC VALVE REPLACEMENT  05/24/1998  ? aortic valve   ? CARDIOVASCULAR STRESS TEST  08/25/2011  ? R/L MV - EF 72%; no scintigraphic evidence of inducible MI; normal perfusionTID of 1.25 elevated - could indicate  small vessle subendocardial ischemia; EKG NSR at 66, non diagnostic for ischemia  ? CARPAL TUNNEL RELEASE  05/25/1987  ? bilaterally  ? CATARACT EXTRACTION W/ INTRAOCULAR LENS  IMPLANT, BILATERAL  ~ 2007  ? [place]; 1971  ? CHOLECYSTECTOMY  05/24/1988  ? COLONOSCOPY N/A 10/27/2012  ? Procedure: COLONOSCOPY;  Surgeon: Beryle Beams, MD;  Location: [facility];  Service: Endoscopy;  Laterality: N/A;  ? CORONARY ARTERY BYPASS GRAFT  05/24/1998  ? RIMA to RCA  ? CORONARY/GRAFT ANGIOGRAPHY N/A 09/07/2021  ? Procedure: CORONARY/GRAFT ANGIOGRAPHY;  Surgeon: Early Osmond, MD;  Location: [facility];  Service: Cardiovascular;  Laterality: N/A;  ? DOPPLER ECHOCARDIOGRAPHY   03/29/2012  ? EF >55%; mild concentric LVH; stage 1 diastolic dysfunction, elevated LV filling pressure, dilated LA; MAC mild MR; St Jude AVR peak and mean gradients of 23mHg and 235mg; transvalvular gradients have increased (prev 23 and 14 respectively)  ? ESOPHAGOGASTRODUODENOSCOPY N/A 10/27/2012  ? Procedure: ESOPHAGOGASTRODUODENOSCOPY (EGD);  Surgeon: PaBeryle BeamsMD;  [facility];  Service: Endoscopy;  Laterality: N/A;  ? FEMORAL ARTERY STENT  09/20/2011  ? 6 x 40 Smart Nitinol self-expanding stent placed;  10/15/2031 -R SFA stent open and patent w/o evidence of restenosis  ? FRACTIONAL FLOW RESERVE WIRE  12/14/2013  ? Procedure: FRACTIONAL FLOW RESERVE WIRE;  Surgeon: DaLarey DresserMD;  [facility];  Service: Cardiovascular;;  ? KNEE SURGERY Left   ? LACERATION REPAIR    ? right hand  ? LEFT AND RIGHT HEART CATHETERIZATION WITH CORONARY ANGIOGRAM N/A 12/14/2013  ? Procedure: LEFT AND RIGHT HEART CATHETERIZATION WITH CORONARY ANGIOGRAM;  Surgeon: DaLarey DresserMD;  [facility];  Service: Cardiovascular;  Laterality: N/A;  ? LOWER EXTREMITY ANGIOGRAM N/A 09/20/2011  ? Procedure: LOWER EXTREMITY ANGIOGRAM;  Surgeon: JoLorretta HarpMD;  [facility];  Service: Cardiovascular;  Laterality: N/A;  ? LYMPH NODE BIOPSY  06/25/2011  ? "core needle on 5"  ? needle biopsy  05/25/2007  ? "on ankles for nerve damage"  ? NEUROPLASTY / TRANSPOSITION ULNAR NERVE AT ELBOW    ? right  ? PERCUTANEOUS CORONARY STENT INTERVENTION (PCI-S)  12/14/2013  ? Procedure: PERCUTANEOUS CORONARY STENT INTERVENTION (PCI-S);  Surgeon: DaLarey DresserMD;  [facility];  Service: Cardiovascular;;  Prox LAD 3.00x12 Promus DES ?  ? PERIPHERAL ARTERIAL STENT GRAFT  09/20/2011  ? right SFA  ? REFRACTIVE SURGERY  ` 2004  ? "for glaucoma"  ? TONSILLECTOMY  05/24/1966  ? TRIGGER FINGER RELEASE Left 12/04/2015  ? Procedure: LEFT RING FINGER TRIGGER RELEASE;  Surgeon: KeLeanora CoverMD;   [facility];  Laterality: Left;  ? VAGINAL HYSTERECTOMY  05/25/1983  ? Fibroids  ? ? ?Family History  ?Problem Relation Age of Onset  ? Colon cancer Mother   ? COPD Mother   ? Emphysema Mother   ? Cancer Maternal Grandmother   ? Cancer Maternal Grandfather   ? Heart disease Brother   ? Diabetes Neg Hx   ? ? ?Social History:  reports that she has never smoked. She has never used smokeless tobacco. She reports that she does not drink alcohol and does not use drugs. ? ?Allergies:  ?Allergies  ?Allergen Reactions  ? Atorvastatin   ?  Muscle pain  ? Dapagliflozin   ?  Other reaction(s): nausea  ? Gabapentin   ?  Other reaction(s): stomach issues  ?  Simvastatin Other (See Comments)  ?  Muscle pain  ? Sulfa Antibiotics   ?  unknown  ? Iodinated Contrast Media Rash  ?   Red rash after cardiac cath 1 wk ago, ? Contrast allergy, requires 13 hr prep now per [email or web address] ?   ? ? ?Medications:  ?No current facility-administered medications on file prior to encounter.  ? ?Current Outpatient Medications on File Prior to Encounter  ?Medication Sig Dispense Refill  ? aspirin EC 81 MG tablet Take 81 mg by mouth every evening.    ? Capsaicin-Menthol (SALONPAS GEL-PATCH HOT EX) Apply 1 patch topically daily as needed (pain).    ? Cholecalciferol (VITAMIN D) 125 MCG (5000 UT) CAPS Take 5,000 Units by mouth daily.    ? ferrous sulfate 325 (65 FE) MG tablet Take 1 tablet (325 mg total) by mouth daily. 30 tablet 0  ? folic acid (FOLVITE) 1 MG tablet Take 1 mg by mouth daily.    ? HYDROcodone-acetaminophen (NORCO/VICODIN) 5-325 MG tablet Take 1 tablet by mouth every 6 (six) hours as needed for moderate pain.    ? latanoprost (XALATAN) 0.005 % ophthalmic solution PLACE 1 DROP INTO BOTH EYES AT BEDTIME. (Patient taking differently: Place 1 drop into both eyes at bedtime.) 2.5 mL 2  ? Menthol, Topical Analgesic, (BIOFREEZE) 4 % GEL Apply 1 application. topically daily as needed (pain).     ? metFORMIN (GLUCOPHAGE) 1000 MG tablet Take 1,000 mg by mouth daily with breakfast.     ? metoprolol succinate (TOPROL-XL) 50 MG 24 hr tablet Take 1 tablet (50 mg total) by mouth daily. NEEDS FOLLOW UP APP

## 2021-09-09 NOTE — Assessment & Plan Note (Signed)
Continue sertraline 

## 2021-09-09 NOTE — Anesthesia Procedure Notes (Signed)
Procedure Name: Intubation ?Date/Time: 09/09/2021 8:50 AM ?Performed by: Georgia Duff, CRNA ?Pre-anesthesia Checklist: Patient identified, Emergency Drugs available, Suction available and Patient being monitored ?Patient Re-evaluated:Patient Re-evaluated prior to induction ?Oxygen Delivery Method: Circle System Utilized ?Preoxygenation: Pre-oxygenation with 100% oxygen ?Induction Type: IV induction ?Ventilation: Mask ventilation without difficulty ?Laryngoscope Size: Sabra Heck and 3 ?Grade View: Grade I ?Tube type: Oral ?Number of attempts: 1 ?Airway Equipment and Method: Stylet and Oral airway ?Placement Confirmation: ETT inserted through vocal cords under direct vision, positive ETCO2 and breath sounds checked- equal and bilateral ?Secured at: 21 cm ?Tube secured with: Tape ?Dental Injury: Teeth and Oropharynx as per pre-operative assessment  ? ? ? ? ?

## 2021-09-09 NOTE — Assessment & Plan Note (Addendum)
Continue Crestor 

## 2021-09-09 NOTE — Assessment & Plan Note (Signed)
BP controlled.  Continue metoprolol 

## 2021-09-09 NOTE — Hospital Course (Addendum)
Abigail Oliver is a 76 yo. F with CAD s/p CABG, PCI last 2015, hx AVR on warfarin, DCHF, HTN, HLD, and DM who presented with fall, found to have  left intercondylar fracture of femur with intra-articular extension.  Admitted for surgical intervention. ? ?While in the hospital, developed chest discomfort, elevated troponins.  Subsequent cardiac cath showed 50% LAD, 100% distal RCA, RIMA-PDA patent. Cardiology optimized medications and cleared for surgery. ?

## 2021-09-09 NOTE — Assessment & Plan Note (Addendum)
Discused with pharmacy ?- Continue warfarin ?- Start Lovenox ?- Patient right at the cusp of once daily or BID dosing. ?- Trend Cr  ?

## 2021-09-09 NOTE — Assessment & Plan Note (Signed)
-  Continue aspirin, Crestor, metoprolol ?

## 2021-09-09 NOTE — Assessment & Plan Note (Signed)
Glucose good ?-Continue SS corrections ?-Hold metformin ?

## 2021-09-09 NOTE — Assessment & Plan Note (Addendum)
S/p ORIF LEFT intra-articular distal femur fracture by Dr. Doreatha Martin on 4/19 ? ?PT and OT recommend [place] therapy ?- TDWB to left ? ?- Unrestricted ROM to left ?- Follow up with Ortho in 2 weeks ? ?

## 2021-09-09 NOTE — Assessment & Plan Note (Addendum)
Patient devleoped chest pressure int he hospital, found to have troponin elevation and St depressions.   ? ?Subsequent LHC showed no thrombus, no stenting needed ?-Continue aspirin, metoprolol, Crestor ?

## 2021-09-09 NOTE — Assessment & Plan Note (Addendum)
Cr up to 2 post op.  Today imrpoved to 1.8.  Patient refused fluids. ?Baseline Appears to be around 1.3-1.6 ? ?

## 2021-09-09 NOTE — Op Note (Signed)
Orthopaedic Surgery Operative Note (CSN: [id number] ) ?Date of Surgery: 09/09/2021  ?Admit Date: 09/04/2021  ? ?Diagnoses: ?Pre-Op Diagnoses: ?Left supracondylar distal femur with intracondylar extension ? ?Post-Op Diagnosis: ?Same ? ?Procedures: ?CPT 84665-LDJT reduction internal fixation of left intra-articular distal femur fracture ? ?Surgeons : ?Primary: Shona Needles, MD ? ?Assistant: Patrecia Pace, PA-C ? ?Location: OR 7  ? ?Anesthesia:General  ? ?Antibiotics: Ancef 2g preop with 1 gm vancomycin powder placed topically  ? ?Tourniquet time: None   ? ?Estimated Blood Loss: 50 mL ? ?Complications:None  ? ?Specimens:None  ? ?Implants: ?Implant Name Type Inv. Item Serial No. Manufacturer Lot No. LRB No. Used Action  ?CAP LOCK NCB - [id number] Cap CAP LOCK NCB  [employer](ORTH,TRAU,BIO,SG)  Left 8 Implanted  ?SCREW 5.0 80MM - [id number] Screw SCREW 5.0 80MM  [employer](ORTH,TRAU,BIO,SG)  Left 4 Implanted  ?SCREW NCB 5.0X34MM - [id number] Screw SCREW NCB 5.0X34MM  [employer](ORTH,TRAU,BIO,SG)  Left 1 Implanted  ?SCREW NCB 5.0X36MM - [id number] Screw SCREW NCB 5.0X36MM  [employer](ORTH,TRAU,BIO,SG)  Left 1 Implanted  ?SCREW NCB 5.0X85MM - [id number] Screw SCREW NCB 5.0X85MM  Z[employer](ORTH,TRAU,BIO,SG)  Left 1 Implanted  ?SCREW NCB 4.0MX34M - [id number] Screw SCREW NCB 4.0MX34M  Z[employer](ORTH,TRAU,BIO,SG)  Left 1 Implanted  ?PLATE DIST FEM 55H - [id number] Plate PLATE DIST FEM 64W  [employer](ORTH,TRAU,BIO,SG)  Left 1 Implanted  ?  ? ?Indications for Surgery: ?76 year old female who sustained an intra-articular left distal femur fracture due to the unstable nature of her injury I recommend proceeding with open reduction internal fixation.  Risks and benefits were discussed with the patient.  Risks included but not limited to bleeding, infection, malunion, nonunion, hardware failure, hardware irritation, nerve or blood vessel injury, knee stiffness, knee arthritis, even the possibility of anesthetic  complications.  She agreed to proceed with surgery consent was obtained. ? ?Operative Findings: ?Open reduction internal fixation of left supracondylar distal femur fracture with intercondylar extension using [employer] NCB 12 hole distal femoral locking plate. ? ?Procedure: ?The patient was identified in the preoperative holding area. Consent was confirmed with the patient and their family and all questions were answered. The operative extremity was marked after confirmation with the patient. she was then brought back to the operating room by our anesthesia colleagues.  She was carefully transferred over to radiolucent flat top table.  She was placed under general anesthetic.  A bump was placed under her operative hip.  The left lower extremity was then prepped and draped in usual sterile fashion.  A timeout was performed to verify the patient, the procedure, and the extremity.  Preoperative antibiotics were dosed. ? ?Fluoroscopic imaging was obtained to show the unstable nature of her injury.  The hip and knee were flexed over a triangle.  Alignment was obtained.  Lateral parapatellar incision was then made carried down through skin and subcutaneous tissue.  I exposed the lateral condyle of the femur and I was able to visualize the articular split.  I used a reduction tenaculum to anatomically reduce this.  Her bone quality was poor but I was able to provisionally hold this reduction with a 2.0 mm K wires from lateral to medial.  I brought these out the medial side to hold the reduction while I placed the plate. ? ?Once I had provisional reduction of the articular split I then proceeded to attach a [employer] NCB distal femoral locking plate onto a targeting arm and slid this submuscularly along the lateral cortex of the femur.  I held provisionally distally with a 2.0 mm K wire.  Proximally I placed a 3.3 mm drill bit bicortically to align the proximal portion of the plate.  I then used a 5.0 millimeter  screws to bring the plate flush to bone distally.  2 screws were placed and then I percutaneously placed 5.0 millimeter screws into the femoral shaft.  I replaced the 3.3 mm drill bit proximally with a 4.0 millimeter screw.  The 5.0 millimeter screws in the femoral shaft placed locking caps. ? ?Return to the distal segment and proceeded to place 3 more four 5.0 millimeter screws.  Excellent fixation distally was obtained.  I then placed locking caps on all the 5 distal screws.  The targeting arm was removed.  Final fluoroscopic imaging was obtained.  The incision was copiously irrigated.  A gram of vancomycin powder was placed into the incision.  Layered closure of 0 Vicryl, 2-0 Vicryl and 3-0 Monocryl with Dermabond was used to close the skin.  Sterile dressings were applied.  The patient was then awoken from anesthesia and taken to the PACU in stable condition. ? ?Post Op Plan/Instructions: ?Patient will be touchdown weightbearing to the left lower extremity.  She will be able to receive Ancef postoperatively.  She may be restarted on her anticoagulation tomorrow morning.  She will have unrestricted range of motion of the knee.  We will have her mobilize with physical and Occupational Therapy. ? ?I was present and performed the entire surgery. ? ?Patrecia Pace, PA-C did assist me throughout the case. An assistant was necessary given the difficulty in approach, maintenance of reduction and ability to instrument the fracture. ? ? ?Katha Hamming, MD ?[facility]  ?

## 2021-09-09 NOTE — Progress Notes (Signed)
Nutrition Follow-up ? ?DOCUMENTATION CODES:  ?Not applicable ? ?INTERVENTION:  ?Continue current diet as ordered, encourage PO intake. ?Discontinue glucerna, pt does not like them.  ?Continue MVI with minerals daily ?Add Magic cup TID with meals, each supplement provides 290 kcal and 9 grams of protein ? ?NUTRITION DIAGNOSIS:  ?Increased nutrient needs related to other (see comment) (femur fracture) as evidenced by estimated needs. ?- remains applicable ? ?GOAL:  ?Patient will meet greater than or equal to 90% of their needs ?- progressing; diet advanced, supplements in place ? ?MONITOR:  ?PO intake, Supplement acceptance, Diet advancement, Labs, Weight trends ? ?REASON FOR ASSESSMENT:  ?Consult ?Hip fracture protocol ? ?ASSESSMENT:  ?Pt with PMH significant for CAD s/p CABG, AVR, glaucoma, CHF, HTN, HLD, and DM admitted with closed bicondylar fracture of distal femur. ? ?4/19 - ORIF of left distal femur fracture ? ?Pt resting in bed at the time of assessment. Had surgery this AM. Inquired about intake. Pt reports that she has not been eating much due to feeling nauseated from the pain medicine she was receiving. Reviewed intake and does appear to be inadequate. Pt reports that she does not like nutrition supplements. Refers to them as "liquid chalk." ? ?Talked to pt about the importance of adequate nutrition in recovery from surgery and surgical site healing.  ? ?Pt states that at baseline, she does not like to eat large meals. Prefers to eat items like fruit, yogurt, and nuts. Also reports that she has trouble chewing some meats like steak.  ? ?Will add magic cup to meal trays as pt is not taking in adequate nutrition but refuses ensure and does like sweets. ? ?Average Meal Intake: ?4/14-4/19: 33% intake x 3 recorded meals ? ?Nutritionally Relevant Medications: ?Scheduled Meds: ? dicyclomine  10 mg Oral TID AC  ? docusate sodium  100 mg Oral BID  ? GLUCERNA SHAKE  237 mL Oral TID BM  ? insulin aspart  0-15 Units  Subcutaneous TID WC  ? insulin aspart  0-5 Units Subcutaneous QHS  ? multivitamin with minerals  1 tablet Oral Daily  ? pantoprazole  40 mg Oral Daily  ? rosuvastatin  10 mg Oral QPM  ? ?PRN Meds: alum & mag hydroxide-simeth, bisacodyl, ondansetron, polyethylene glycol,  ? ?Labs Reviewed: ?BUN 46, creatinine 2.05 ?CBG ranges from 99-158 mg/dL over the last 24 hours ?HgbA1c 5.5% ? ?NUTRITION - FOCUSED PHYSICAL EXAM: ?Flowsheet Row Most Recent Value  ?Orbital Region Mild depletion  ?Upper Arm Region No depletion  ?Thoracic and Lumbar Region No depletion  ?Buccal Region Mild depletion  ?Temple Region No depletion  ?Clavicle Bone Region No depletion  ?Clavicle and Acromion Bone Region No depletion  ?Scapular Bone Region No depletion  ?Dorsal Hand No depletion  ?Patellar Region No depletion  ?Anterior Thigh Region No depletion  ?Posterior Calf Region No depletion  ?Edema (RD Assessment) Mild  ?Hair Reviewed  ?Eyes Reviewed  ?Mouth Reviewed  ?Skin Reviewed  ?Nails Reviewed  ? ? ?Diet Order:   ?Diet Order   ? ?       ?  Diet Carb Modified Fluid consistency: Thin; Room service appropriate? Yes  Diet effective now       ?  ? ?  ?  ? ?  ? ? ?EDUCATION NEEDS:  ?No education needs have been identified at this time ? ?Skin:  Skin Assessment: Reviewed RN Assessment ? ?Last BM:  4/13, prior to admission (noted pt refusing bowel regimen) ? ?Height:  ?Ht Readings from  Last 1 Encounters:  ?09/06/21 '5\' 2"'$  (1.575 m)  ? ? ?Weight:  ?Wt Readings from Last 1 Encounters:  ?09/06/21 77.8 kg  ? ? ?Ideal Body Weight:  50 kg ? ?BMI:  Body mass index is 31.37 kg/m?. ? ?Estimated Nutritional Needs:  ?Kcal:  1500-1700 kcal/d ?Protein:  75-85 g/d ?Fluid:  >/= 1.7 L/d ? ? ?Ranell Patrick, RD, LDN ?Clinical Dietitian ?RD pager # available in [place]  ?After hours/weekend pager # available in [place] ?

## 2021-09-09 NOTE — Transfer of Care (Signed)
Immediate Anesthesia Transfer of Care Note ? ?Patient: Abigail Oliver ? ?Procedure(s) Performed: OPEN REDUCTION INTERNAL FIXATION (ORIF) DISTAL FEMUR FRACTURE (Left: Leg Upper) ? ?Patient Location: PACU ? ?Anesthesia Type:General ? ?Level of Consciousness: drowsy ? ?Airway & Oxygen Therapy: Patient Spontanous Breathing and Patient connected to nasal cannula oxygen ? ?Post-op Assessment: Report given to RN and Post -op Vital signs reviewed and stable ? ?Post vital signs: Reviewed and stable ? ?Last Vitals:  ?Vitals Value Taken Time  ?BP    ?Temp    ?Pulse    ?Resp    ?SpO2    ? ? ?Last Pain:  ?Vitals:  ? 09/09/21 0645  ?TempSrc: Oral  ?PainSc:   ?   ? ?Patients Stated Pain Goal: 0 (09/08/21 1408) ? ?Complications: No notable events documented. ?

## 2021-09-09 NOTE — Interval H&P Note (Signed)
History and Physical Interval Note: ? ?09/09/2021 ?8:30 AM ? ?Abigail Oliver  has presented today for surgery, with the diagnosis of left distal femur fracture.  The various methods of treatment have been discussed with the patient and family. After consideration of risks, benefits and other options for treatment, the patient has consented to  Procedure(s): ?OPEN REDUCTION INTERNAL FIXATION (ORIF) DISTAL FEMUR FRACTURE (Left) as a surgical intervention.  The patient's history has been reviewed, patient examined, no change in status, stable for surgery.  I have reviewed the patient's chart and labs.  Questions were answered to the patient's satisfaction.   ? ? ?Lennette Bihari P Daris Harkins ? ? ?

## 2021-09-10 ENCOUNTER — Other Ambulatory Visit (HOSPITAL_COMMUNITY): Payer: Self-pay

## 2021-09-10 DIAGNOSIS — Z952 Presence of prosthetic heart valve: Secondary | ICD-10-CM | POA: Diagnosis not present

## 2021-09-10 DIAGNOSIS — S72422A Displaced fracture of lateral condyle of left femur, initial encounter for closed fracture: Secondary | ICD-10-CM | POA: Diagnosis not present

## 2021-09-10 DIAGNOSIS — F419 Anxiety disorder, unspecified: Secondary | ICD-10-CM

## 2021-09-10 DIAGNOSIS — I249 Acute ischemic heart disease, unspecified: Secondary | ICD-10-CM

## 2021-09-10 DIAGNOSIS — I2510 Atherosclerotic heart disease of native coronary artery without angina pectoris: Secondary | ICD-10-CM | POA: Diagnosis not present

## 2021-09-10 DIAGNOSIS — D509 Iron deficiency anemia, unspecified: Secondary | ICD-10-CM | POA: Insufficient documentation

## 2021-09-10 DIAGNOSIS — S72432A Displaced fracture of medial condyle of left femur, initial encounter for closed fracture: Secondary | ICD-10-CM | POA: Diagnosis not present

## 2021-09-10 DIAGNOSIS — N179 Acute kidney failure, unspecified: Secondary | ICD-10-CM

## 2021-09-10 DIAGNOSIS — D649 Anemia, unspecified: Secondary | ICD-10-CM

## 2021-09-10 LAB — PROTIME-INR
INR: 1.7 — ABNORMAL HIGH (ref 0.8–1.2)
Prothrombin Time: 19.4 seconds — ABNORMAL HIGH (ref 11.4–15.2)

## 2021-09-10 LAB — CBC
HCT: 26.8 % — ABNORMAL LOW (ref 36.0–46.0)
Hemoglobin: 8.1 g/dL — ABNORMAL LOW (ref 12.0–15.0)
MCH: 27.2 pg (ref 26.0–34.0)
MCHC: 30.2 g/dL (ref 30.0–36.0)
MCV: 89.9 fL (ref 80.0–100.0)
Platelets: 176 10*3/uL (ref 150–400)
RBC: 2.98 MIL/uL — ABNORMAL LOW (ref 3.87–5.11)
RDW: 17.6 % — ABNORMAL HIGH (ref 11.5–15.5)
WBC: 9 10*3/uL (ref 4.0–10.5)
nRBC: 0.2 % (ref 0.0–0.2)

## 2021-09-10 LAB — GLUCOSE, CAPILLARY
Glucose-Capillary: 146 mg/dL — ABNORMAL HIGH (ref 70–99)
Glucose-Capillary: 140 mg/dL — ABNORMAL HIGH (ref 70–99)
Glucose-Capillary: 103 mg/dL — ABNORMAL HIGH (ref 70–99)
Glucose-Capillary: 123 mg/dL — ABNORMAL HIGH (ref 70–99)

## 2021-09-10 LAB — BASIC METABOLIC PANEL
Anion gap: 11 (ref 5–15)
BUN: 40 mg/dL — ABNORMAL HIGH (ref 8–23)
CO2: 19 mmol/L — ABNORMAL LOW (ref 22–32)
Calcium: 8.8 mg/dL — ABNORMAL LOW (ref 8.9–10.3)
Chloride: 105 mmol/L (ref 98–111)
Creatinine, Ser: 1.8 mg/dL — ABNORMAL HIGH (ref 0.44–1.00)
GFR, Estimated: 29 mL/min — ABNORMAL LOW (ref >60)
Glucose, Bld: 160 mg/dL — ABNORMAL HIGH (ref 70–99)
Potassium: 4.2 mmol/L (ref 3.5–5.1)
Sodium: 135 mmol/L (ref 135–145)

## 2021-09-10 LAB — HEPARIN LEVEL (UNFRACTIONATED)
Heparin Unfractionated: <0.1 IU/mL — ABNORMAL LOW (ref 0.30–0.70)
Heparin Unfractionated: 0.35 IU/mL (ref 0.30–0.70)

## 2021-09-10 MED ORDER — HEPARIN (PORCINE) 25000 UT/250ML-% IV SOLN
1150.0000 [IU]/h | INTRAVENOUS | Status: DC
  Administered 2021-09-10: 1150 [IU]/h via INTRAVENOUS
  Filled 2021-09-10: qty 250

## 2021-09-10 MED ORDER — WARFARIN - PHARMACIST DOSING INPATIENT: Freq: Every day | Status: DC

## 2021-09-10 MED ORDER — WARFARIN SODIUM 2 MG PO TABS
4.0000 mg | ORAL_TABLET | Freq: Once | ORAL | Status: AC
  Administered 2021-09-10: 4 mg via ORAL
  Filled 2021-09-10: qty 2

## 2021-09-10 NOTE — Progress Notes (Signed)
Orthopaedic Trauma Progress Note ? ?SUBJECTIVE: Doing okay today.  Having a moderate amount of pain in the left leg, notes medications take the edge off but wear off quickly. Was able to get up to bedside chair with therapies this morning.  No chest pain. No SOB. No nausea/vomiting. No other complaints.  ? ?OBJECTIVE:  ?Vitals:  ? 09/10/21 0500 09/10/21 0727  ?BP: (!) 118/46 124/62  ?Pulse: 79 80  ?Resp: 20 20  ?Temp: 98.3 ?F (36.8 ?C) 98.6 ?F (37 ?C)  ?SpO2: 98% 98%  ? ? ?General: Laying in bed, no acute distress ?Respiratory: No increased work of breathing.  ?Left lower extremity: Dressing clean, dry, intact.  Tenderness throughout the distal thigh as expected.  No significant calf tenderness.  Tolerates gentle ankle range of motion.  Able to wiggle toes.  Endorses sensation to light touch throughout the extremity.  Compartments remain soft and compressible. + DP pulse ? ?IMAGING: Stable post op imaging.  ? ?LABS:  ?Results for orders placed or performed during the hospital encounter of 09/04/21 (from the past 24 hour(s))  ?Glucose, capillary     Status: Abnormal  ? Collection Time: 09/09/21  4:09 PM  ?Result Value Ref Range  ? Glucose-Capillary 163 (H) 70 - 99 mg/dL  ?Glucose, capillary     Status: Abnormal  ? Collection Time: 09/09/21  8:46 PM  ?Result Value Ref Range  ? Glucose-Capillary 241 (H) 70 - 99 mg/dL  ?Heparin level (unfractionated)     Status: Abnormal  ? Collection Time: 09/10/21  4:12 AM  ?Result Value Ref Range  ? Heparin Unfractionated <0.10 (L) 0.30 - 0.70 IU/mL  ?CBC     Status: Abnormal  ? Collection Time: 09/10/21  4:12 AM  ?Result Value Ref Range  ? WBC 9.0 4.0 - 10.5 K/uL  ? RBC 2.98 (L) 3.87 - 5.11 MIL/uL  ? Hemoglobin 8.1 (L) 12.0 - 15.0 g/dL  ? HCT 26.8 (L) 36.0 - 46.0 %  ? MCV 89.9 80.0 - 100.0 fL  ? MCH 27.2 26.0 - 34.0 pg  ? MCHC 30.2 30.0 - 36.0 g/dL  ? RDW 17.6 (H) 11.5 - 15.5 %  ? Platelets 176 150 - 400 K/uL  ? nRBC 0.2 0.0 - 0.2 %  ?Protime-INR     Status: Abnormal  ? Collection  Time: 09/10/21  4:12 AM  ?Result Value Ref Range  ? Prothrombin Time 19.4 (H) 11.4 - 15.2 seconds  ? INR 1.7 (H) 0.8 - 1.2  ?Basic metabolic panel     Status: Abnormal  ? Collection Time: 09/10/21  4:12 AM  ?Result Value Ref Range  ? Sodium 135 135 - 145 mmol/L  ? Potassium 4.2 3.5 - 5.1 mmol/L  ? Chloride 105 98 - 111 mmol/L  ? CO2 19 (L) 22 - 32 mmol/L  ? Glucose, Bld 160 (H) 70 - 99 mg/dL  ? BUN 40 (H) 8 - 23 mg/dL  ? Creatinine, Ser 1.80 (H) 0.44 - 1.00 mg/dL  ? Calcium 8.8 (L) 8.9 - 10.3 mg/dL  ? GFR, Estimated 29 (L) >60 mL/min  ? Anion gap 11 5 - 15  ?Glucose, capillary     Status: Abnormal  ? Collection Time: 09/10/21  7:21 AM  ?Result Value Ref Range  ? Glucose-Capillary 146 (H) 70 - 99 mg/dL  ?Glucose, capillary     Status: Abnormal  ? Collection Time: 09/10/21 11:25 AM  ?Result Value Ref Range  ? Glucose-Capillary 140 (H) 70 - 99 mg/dL  ? ?*Note: Due  to a large number of results and/or encounters for the requested time period, some results have not been displayed. A complete set of results can be found in Results Review.  ? ? ?ASSESSMENT: Abigail Oliver is a 76 y.o. female, 1 Day Post-Op s/p ?ORIF LEFT DISTAL FEMUR FRACTURE ? ?CV/Blood loss: Acute blood loss anemia, Hgb 8.1 this morning.  This is relatively stable from pre-op labs. ? ?PLAN: ?Weightbearing: TDWB LLE ?ROM: Okay for unrestricted knee ROM as tolerated ?Incisional and dressing care: OK to remove dressings 09/11/2021 and leave open to air with dry gauze PRN  ?Showering: Hold off on showering for now until dressing is removed, okay for bed bath. ?Orthopedic device(s): None  ?Pain management:  ?1. Tylenol 650 mg q 6 hours scheduled ?2. Robaxin 500 mg q 6 hours PRN ?3. Oxycodone 5-10 mg q 4 hours PRN ?4. Fentanyl 12.5 mg q 2 hours PRN ?VTE prophylaxis: Coumadin and heparin , SCDs ?ID:  Ancef 2gm post op ?Foley/Lines: No foley, KVO IVFs ?Impediments to Fracture Healing: Vitamin D level 35.  Currently on 5000 units vitamin D3 daily at baseline, no  additional supplementation indicated. ?Dispo: PT/OT evaluation today, dispo pending.  We will plan to remove dressing from LLE tomorrow and likely leave incisions open to air if no drainage.  ? ?D/C recommendations: ?- Oxycodone 5 mg and Robaxin 500 mg for pain control ?- Continue home dose anticoagulation ?- No additional Vit D supplementation needed ? ?Follow - up plan: 2 weeks ? ? ?Contact information:  Katha Hamming MD, Rushie Nyhan PA-C. After hours and holidays please check [email or web address] for group call information for [facility] ? ? ?Gwinda Passe, PA-C ?([phone] (office) ?[email or web address] ? ? ? ?

## 2021-09-10 NOTE — Assessment & Plan Note (Addendum)
Baseline Hgb 11, here down to 8s, stable, then 7.4 this morning post op ?Iron studies show very low iron sat.  B12 low normal ?- Give IV iron ?- Repeat iron studies in 2 months ?- Start B12 ?- Check B12 in 2 months  ?

## 2021-09-10 NOTE — Evaluation (Signed)
Occupational Therapy Evaluation ?Patient Details ?Name: Abigail Oliver ?MRN: [id number] ?DOB: Nov 10, 1945 ?Today's Date: 09/10/2021 ? ? ?History of Present Illness The pt is a 76 yo female presenting 4/14 after a fall at home resulting in L femoral condylar fx. RR on 4/16 due to pt developing chest pain, found to have NSTEMI. She is now s/p ORIF of L distal femur fx on 4/19. PMH includes: CAD s/p CABG, AVR on Coumadin, glaucoma, CHF, HTN, HLD, and DM II.  ? ?Clinical Impression ?  ?PTA pt lived independently at home with her husband, who can assist 24/7 at [place]. Pt able to complete a stand pivot transfer with min A @ RW level and requires mod A for ADL tasks. Husband had assisted pt to the [facility] before intry to room - educated pt to call staff for assistance. Feel pt can DC home @ w/c level with [place] and [place] (if available) with husband assisting. Pt states her husband and son can bump her w/c up the STE. Acute OT to follow.   ?Pt complaining of "chest heaviness" with increased activity however VSS.  ?   ? ?Recommendations for follow up therapy are one component of a multi-disciplinary discharge planning process, led by the attending physician.  Recommendations may be updated based on patient status, additional functional criteria and insurance authorization.  ? ?Follow Up Recommendations ? Home health OT  ?  ?Assistance Recommended at Discharge Frequent or constant Supervision/Assistance  ?Patient can return home with the following A little help with walking and/or transfers;A little help with bathing/dressing/bathroom;Assistance with cooking/housework;Assist for transportation;Help with stairs or ramp for entrance ? ?  ?Functional Status Assessment ? Patient has had a recent decline in their functional status and demonstrates the ability to make significant improvements in function in a reasonable and predictable amount of time.  ?Equipment Recommendations ? None recommended by OT  ?  ?Recommendations for Other  Services PT consult ? ? ?  ?Precautions / Restrictions Precautions ?Precautions: Fall ?Precaution Comments: recent NSTEMI ?Restrictions ?Weight Bearing Restrictions: No ?LLE Weight Bearing: Touchdown weight bearing ?Other Position/Activity Restrictions: unrestricted knee ROM  ? ?  ? ?Mobility Bed Mobility ?Overal bed mobility:  (OOB on BSC) ?  ?  ?  ?  ?  ?  ?  ?  ? ?Transfers ?Overall transfer level: Needs assistance ?Equipment used: Rolling walker (2 wheels) ?Transfers: Sit to/from Stand, Bed to chair/wheelchair/BSC ?Sit to Stand: Min assist ?Stand pivot transfers: Min assist ?  ?  ?  ?  ?General transfer comment: scooting leg ?  ? ?  ?Balance Overall balance assessment: Needs assistance ?  ?Sitting balance-Leahy Scale: Good ?  ?  ?  ?Standing balance-Leahy Scale: Poor ?  ?  ?  ?  ?  ?  ?  ?  ?  ?  ?  ?  ?   ? ?ADL either performed or assessed with clinical judgement  ? ?ADL Overall ADL's : Needs assistance/impaired ?  ?  ?Grooming: Set up ?  ?Upper Body Bathing: Set up ?  ?Lower Body Bathing: Moderate assistance;Sit to/from stand ?  ?Upper Body Dressing : Set up ?  ?Lower Body Dressing: Maximal assistance ?  ?Toilet Transfer: Minimal assistance;BSC/3in1;Rolling walker (2 wheels);Stand-pivot ?  ?Toileting- Clothing Manipulation and Hygiene: Moderate assistance ?  ?  ?  ?Functional mobility during ADLs: Minimal assistance;Rolling walker (2 wheels) ?General ADL Comments: needs cues for safety for ADL; may benefit fomr AE; husband had assisted her to the [facility] - educated  pt on need to call for staff  ? ? ? ?Vision   ?   ?   ?Perception   ?  ?Praxis   ?  ? ?Pertinent Vitals/Pain Pain Assessment ?Pain Assessment: 0-10 ?Pain Score: 7  ?Pain Location: L leg ?Pain Descriptors / Indicators: Aching, Grimacing, Guarding ?Pain Intervention(s): Limited activity within patient's tolerance, Premedicated before session, Repositioned, Ice applied  ? ? ? ?Hand Dominance Right ?  ?Extremity/Trunk Assessment   ?  ?Lower Extremity  Assessment ?Lower Extremity Assessment: Overall WFL for tasks assessed (past hx of L shoulder injury but functional) ?  ?Cervical / Trunk Assessment ?Cervical / Trunk Assessment: Normal ?  ?Communication Communication ?Communication: No difficulties ?  ?Cognition Arousal/Alertness: Awake/alert ?Behavior During Therapy: [facility] for tasks assessed/performed ?Overall Cognitive Status: Within Functional Limits for tasks assessed ?  ?  ?  ?  ?  ?  ?  ?  ?  ?  ?  ?  ?  ?  ?  ?  ?  ?  ?  ?General Comments  VSS ? ?  ?Exercises   ?  ?Shoulder Instructions    ? ? ?Home Living Family/patient expects to be discharged to:: Private residence ?Living Arrangements: Spouse/significant other ?Available Help at Discharge: Family;Available 24 hours/day ?Type of Home: House ?Home Access: Stairs to enter ?Entrance Stairs-Number of Steps: 4 ?Entrance Stairs-Rails: Left ?Home Layout: One level ?  ?  ?Bathroom Shower/Tub: Gaffer;Tub/shower unit ?  ?Bathroom Toilet: Handicapped height ?Bathroom Accessibility: Yes ?How Accessible: Accessible via wheelchair;Accessible via walker ?Home Equipment: Conservation officer, nature (2 wheels);Cane - single point;BSC/3in1;Shower seat;Grab bars - tub/shower;Hand held shower head ?  ?  ?  ? ?  ?Prior Functioning/Environment Prior Level of Function : Independent/Modified Independent ?  ?  ?  ?  ?  ?  ?Mobility Comments: neuropathy ?  ?  ? ?  ?  ?OT Problem List: Decreased strength;Decreased activity tolerance;Impaired balance (sitting and/or standing);Decreased safety awareness;Decreased knowledge of use of DME or AE;Decreased knowledge of precautions;Cardiopulmonary status limiting activity;Obesity;Pain ?  ?   ?OT Treatment/Interventions: Self-care/ADL training;Therapeutic exercise;Energy conservation;DME and/or AE instruction;Therapeutic activities;Patient/family education;Balance training  ?  ?OT Goals(Current goals can be found in the care plan section) Acute Rehab OT Goals ?Patient Stated Goal: to go home ?OT  Goal Formulation: With patient ?Time For Goal Achievement: 09/24/21 ?Potential to Achieve Goals: Good  ?OT Frequency: Min 2X/week ?  ? ?Co-evaluation   ?  ?  ?  ?  ? ?  ?AM-PAC OT "6 Clicks" Daily Activity     ?Outcome Measure Help from another person eating meals?: None ?Help from another person taking care of personal grooming?: A Little ?Help from another person toileting, which includes using toliet, bedpan, or urinal?: A Lot ?Help from another person bathing (including washing, rinsing, drying)?: A Lot ?Help from another person to put on and taking off regular upper body clothing?: A Little ?Help from another person to put on and taking off regular lower body clothing?: A Lot ?6 Click Score: 16 ?  ?End of Session Equipment Utilized During Treatment: Gait belt;Rolling walker (2 wheels) ?Nurse Communication: Mobility status;Precautions;Weight bearing status ? ?Activity Tolerance: Patient tolerated treatment well ?Patient left: in chair;with call bell/phone within reach;with chair alarm set ? ?OT Visit Diagnosis: Unsteadiness on feet (R26.81);Other abnormalities of gait and mobility (R26.89);Muscle weakness (generalized) (M62.81);History of falling (Z91.81);Pain ?Pain - Right/Left: Left ?Pain - part of body: Leg  ?              ?  Time: 1275-1700 ?OT Time Calculation (min): 18 min ?Charges:  OT General Charges ?$OT Visit: 1 Visit ?OT Evaluation ?$OT Eval Moderate Complexity: 1 Mod ? ?[facility], OT/L  ? ?Acute OT Clinical Specialist ?Acute Rehabilitation Services ?Pager [phone] ?Office [phone]  ? ?[name] Tallman,HILLARY ?09/10/2021, 10:35 AM ?

## 2021-09-10 NOTE — Progress Notes (Addendum)
ANTICOAGULATION CONSULT NOTE - Follow Up Consult ? ?Pharmacy Consult for heparin + warfarin ?Indication: mAVR ? ?Allergies  ?Allergen Reactions  ? Atorvastatin   ?  Muscle pain  ? Dapagliflozin   ?  Other reaction(s): nausea  ? Gabapentin   ?  Other reaction(s): stomach issues  ? Simvastatin Other (See Comments)  ?  Muscle pain  ? Sulfa Antibiotics   ?  unknown  ? Iodinated Contrast Media Rash  ?   Red rash after cardiac cath 1 wk ago, ? Contrast allergy, requires 13 hr prep now per [email or web address] ?   ? ? ?Patient Measurements: ?Height: '5\' 2"'$  (157.5 cm) ?Weight: 77.8 kg (171 lb 8.3 oz) ?IBW/kg (Calculated) : 50.1 ?Heparin Dosing Weight: 66.8kg ? ?Vital Signs: ?Temp: 98.6 ?F (37 ?C) (04/20 3976) ?Temp Source: Oral (04/20 7341) ?BP: 124/62 (04/20 0727) ?Pulse Rate: 80 (04/20 0727) ? ?Labs: ?Recent Labs  ?  09/08/21 ?0448 09/09/21 ?0329 09/09/21 ?0712 09/10/21 ?0412  ?HGB 8.3* 8.3*  --  8.1*  ?HCT 26.4* 26.3*  --  26.8*  ?PLT 130* 147*  --  176  ?LABPROT  --  19.0*  --  19.4*  ?INR  --  1.6*  --  1.7*  ?HEPARINUNFRC 0.31 0.19*  --  <0.10*  ?CREATININE 1.69*  --  2.05* 1.80*  ? ? ? ?Estimated Creatinine Clearance: 25.7 mL/min (A) (by C-G formula based on SCr of 1.8 mg/dL (H)). ? ? ?Medical History: ?Past Medical History:  ?Diagnosis Date  ? Anemia   ? Anxiety   ? Aortic stenosis   ? a. Now has St Jude mechanical aortic valve (6/00). b. Echo (6/15) with EF 60-65%, mechanical aortic valve with mean gradient 27 mmHg.  ? Arthritis   ? a. Possible c-spine arthritis with pain down left arm.    ? Carotid artery disease ([place])   ? a. Carotid dopplers (7/16) with 40-59% BICA stenosis.    ? Chronic angle-closure glaucoma(365.23)   ? Chronic diastolic CHF (congestive heart failure) ([place])   ? a.  RHC (7/15) with mean RA 2, PA 22/11, mean PCWP 9, CI 3.79.  ? Contrast media allergy   ? Coronary atherosclerosis of native coronary artery   ? a. CABG at time of AVR in 6/00 with RIMA-RCA. b. Abnl nuc 11/2013 -> LHC (7/15) with  patent RIMA-RCA, 80% mRCA, 80% pLAD with FFR 0.73, treated with DES to pLAD.  ? Depression   ? Essential hypertension   ? GERD (gastroesophageal reflux disease)   ? Hyperlipidemia   ? Low back pain   ? Neuromuscular disorder ([place])   ? neuropathy  ? Peripheral vascular disease ([place])   ? a, H/o Right SFA stent. b. Peripheral arterial dopplers (7/16) with right SFA stent patent.    ? PONV (postoperative nausea and vomiting)   ? N&V  ? Type II diabetes mellitus ([place])   ? ? ?Assessment: ?73 YOF presenting with mechanical fall and femur fx, hx of mechanical AVR on warfarin PTA. Warfarin held for heparin drip with possible need for surgery. Pt s/p LHC with stable CAD - no intervention completed, heparin resumed postcath and then held for femur surgery 4/19. ? ?Heparin resumed this am, warfarin to resume tonight. INR 1.7, CBC stable, no postop S/Sx bleeding noted. ? ? ?Warfarin PTA regimen: 1.5 mg Tues/Thurs, 3 mg all other days ? ?Goal of Therapy:  ?Heparin level 0.3-0.7 units/ml ?Monitor platelets by anticoagulation protocol: Yes ?  ?Plan:  ?Warfarin '4mg'$  PO x1 tonight ?Heparin 1150 units/h ?  Daily heparin level, INR, CBC ? ?ADDENDUM 1422: Heparin level is therapeutic, repeat with am labs. ? ? ? ?Arrie Senate, PharmD, BCPS, BCCP ?Clinical Pharmacist ?[phone] ?Please check AMION for all [place] numbers ?09/10/2021 ? ? ?

## 2021-09-10 NOTE — Progress Notes (Signed)
?Progress Note ? ? ?Patient: Abigail Oliver:[id number] DOB: 03-Feb-1946 DOA: 09/04/2021     6 ?DOS: the patient was seen and examined on 09/10/2021 at 9:16AM ?  ? ? ? ?Brief hospital course: ?Abigail Oliver is a 76 yo. F with CAD s/p CABG, PCI last 2015, hx AVR on warfarin, DCHF, HTN, HLD, and DM who presented with fall, found to have  left intercondylar fracture of femur with intra-articular extension.  Admitted for surgical intervention. ? ?While in the hospital, developed chest discomfort, elevated troponins.  Subsequent cardiac cath showed 50% LAD, 100% distal RCA, RIMA-PDA patent. Cardiology optimized medications and cleared for surgery. ? ? ? ? ?Assessment and Plan: ?* Closed bicondylar fracture of distal femur ([employer]) ?S/p ORIF LEFT intra-articular distal femur fracture by Dr. Doreatha Martin on 4/19 ?- TDWB to left ?- Resume AC today ?- Unrestricted ROM to left ?- Follow up with Ortho in 2 weeks ?- Consult Ortho, appreciate recommendations ?- Consult PT ? ?NSTEMI (non-ST elevated myocardial infarction) ([place]) ?Patient devleoped chest pressure int he hospital, found to have troponin elevation and St depressions.   ? ?Subsequent LHC showed no thrombus, no stenting needed ?-Continue aspirin, metoprolol, Crestor ? ?AKI (acute kidney injury) ([place]) ?Cr up to 2 post op.  Today imrpoved to 1.8.  Patient refused fluids. ?- Hold diuretics and nephrotoxins ?- Trend Cr ? ?Normocytic anemia ?Baseline Hgb 11, here down to 8 ?- Check iron stores, folate, B12 ?-Continue iron, folate ? ?Anxiety ?-Continue sertraline ? ?Essential hypertension ?BP controlled ?-Continue metoprolol ? ?Chronic diastolic heart failure ([place]) ?Appears euvolemic ?-Hold home torsemide given AKI ? ?CAD, CABG 2000, low risk Myoview April 2011; 2015 + myoview ?-Continue aspirin, Crestor, metoprolol ? ?Dyslipidemia ?-Continue Crestor ? ?H/O aortic valve replacement ?-Resume heparin and warfarin ?- Plan for Lovenox at d/c ? ?Type II diabetes mellitus with peripheral  autonomic neuropathy (H[facility]) ?Glucose good ?-Continue SS corrections ?-Hold metformin ? ? ? ? ? ? ? ? ? ?Subjective: No chest pain, dyspnea, swelling, confusion, fever, orthopnea. ? ? ? ? ?Physical Exam: ?Vitals:  ? 09/10/21 0727 09/10/21 1417 09/10/21 1420 09/10/21 1422  ?BP: 124/62  (!) 119/51 (!) 119/51  ?Pulse: 80 84    ?Resp: 20 20    ?Temp: 98.6 ?F (37 ?C) 98 ?F (36.7 ?C)    ?TempSrc: Oral Oral    ?SpO2: 98%     ?Weight:      ?Height:      ? ?Elderly adult female, lying in bed, irritable ?RRR, systolic murmur noted, no peripheral edema, normal rate and rhythm ?Lung sounds clear, no rales or wheezes ?Abdomen soft no tenderness palpation ?Left leg with Ace wrap in place, no significant swelling, distal pulses good ?Attention normal, affect irritable, judgment insight appear normal, moves upper extremities with generalized weakness but symmetric strength, face symmetric, speech fluent ? ?Data Reviewed: ?Orthopedics notes reviewed, cardiology notes reviewed, vital signs reviewed ?Patient metabolic panel notable for creatinine down to 1.8 ?Glucose is normal ?Hemogram notable for hemoglobin stable at 8 ? ?Family Communication: Husband at the bedside ? ? ? ?Disposition: ?Status is: Inpatient ?The patient has undergone repair of her intra-articular left femur fracture. ? ?From the standpoint of her fracture line, she needs to be mobilized with physical therapy and a disposition determined. ? ?From the standpoint of her heart, she really just needs a transition plan for back to warfarin, which would likely be Lovenox to warfarin which can be done as an outpatient ? ? ? ? ? ? ? ? ? ? ? ?  Author: ?Edwin Dada, MD ?09/10/2021 3:10 PM ? ?For on call review [email or web address].  ? ? ?

## 2021-09-10 NOTE — TOC Benefit Eligibility Note (Signed)
Patient Advocate Encounter ? ?Insurance verification completed.   ? ?The patient is currently admitted and upon discharge could be taking enoxaprain (Lovenox) 80 mg/0.8 ml. ? ?The current 30 day co-pay is, $24.44.  ? ?The patient is insured through [employer] Medicare Part D  ? ? ? ?Lyndel Safe, CPhT ?Pharmacy Patient Advocate Specialist ?[place] Patient Advocate Team ?Direct Number: [phone]  Fax: [phone] ? ? ? ? ? ?  ?

## 2021-09-10 NOTE — Progress Notes (Signed)
ANTICOAGULATION CONSULT NOTE - Follow Up Consult ? ?Pharmacy Consult for heparin ?Indication: mAVR/ACS/Chest pain ? ?Allergies  ?Allergen Reactions  ? Atorvastatin   ?  Muscle pain  ? Dapagliflozin   ?  Other reaction(s): nausea  ? Gabapentin   ?  Other reaction(s): stomach issues  ? Simvastatin Other (See Comments)  ?  Muscle pain  ? Sulfa Antibiotics   ?  unknown  ? Iodinated Contrast Media Rash  ?   Red rash after cardiac cath 1 wk ago, ? Contrast allergy, requires 13 hr prep now per dr.[email or web address]//[email or web address] ?   ? ? ?Patient Measurements: ?Height: '5\' 2"'$  (157.5 cm) ?Weight: 77.8 kg (171 lb 8.3 oz) ?IBW/kg (Calculated) : 50.1 ?Heparin Dosing Weight: 66.8kg ? ?Vital Signs: ?Temp: 98.3 ?F (36.8 ?C) (04/20 0500) ?Temp Source: Oral (04/20 0500) ?BP: 118/46 (04/20 0500) ?Pulse Rate: 79 (04/20 0500) ? ?Labs: ?Recent Labs  ?  09/08/21 ?0448 09/09/21 ?0329 09/09/21 ?0712 09/10/21 ?0412  ?HGB 8.3* 8.3*  --  8.1*  ?HCT 26.4* 26.3*  --  26.8*  ?PLT 130* 147*  --  176  ?LABPROT  --  19.0*  --  19.4*  ?INR  --  1.6*  --  1.7*  ?HEPARINUNFRC 0.31 0.19*  --  <0.10*  ?CREATININE 1.69*  --  2.05* 1.80*  ? ? ? ?Estimated Creatinine Clearance: 25.7 mL/min (A) (by C-G formula based on SCr of 1.8 mg/dL (H)). ? ? ?Medical History: ?Past Medical History:  ?Diagnosis Date  ? Anemia   ? Anxiety   ? Aortic stenosis   ? a. Now has [employer] Jude mechanical aortic valve (6/00). b. Echo (6/15) with EF 60-65%, mechanical aortic valve with mean gradient 27 mmHg.  ? Arthritis   ? a. Possible c-spine arthritis with pain down left arm.    ? Carotid artery disease ([place])   ? a. Carotid dopplers (7/16) with 40-59% BICA stenosis.    ? Chronic angle-closure glaucoma(365.23)   ? Chronic diastolic CHF (congestive heart failure) ([place])   ? a.  RHC (7/15) with mean RA 2, PA 22/11, mean PCWP 9, CI 3.79.  ? Contrast media allergy   ? Coronary atherosclerosis of native coronary artery   ? a. CABG at time of AVR in 6/00 with RIMA-RCA. b. Abnl nuc 11/2013 -> LHC (7/15)  with patent RIMA-RCA, 80% mRCA, 80% pLAD with FFR 0.73, treated with DES to pLAD.  ? Depression   ? Essential hypertension   ? GERD (gastroesophageal reflux disease)   ? Hyperlipidemia   ? Low back pain   ? Neuromuscular disorder ([place])   ? neuropathy  ? Peripheral vascular disease ([place])   ? a, H/o Right SFA stent. b. Peripheral arterial dopplers (7/16) with right SFA stent patent.    ? PONV (postoperative nausea and vomiting)   ? N&V  ? Type II diabetes mellitus ([place])   ? ? ?Assessment: ?57 YOF presenting with mechanical fall and femur fx, hx of mechanical AVR on warfarin PTA. Warfarin held for heparin drip with possible need for surgery. Pt s/p LHC with stable CAD - no intervention completed, heparin resumed.  ? ?Heparin level therapeutic at 0.31, CBC stable. ? ?Warfarin PTA regimen: 1.5 mg Tues/Thurs, 3 mg all other days ? ?4/20 AM update:  ?Resuming heparin drip while INR is sub-therapeutic  ?INR is 1.7 this AM ? ?Goal of Therapy:  ?Heparin level 0.3-0.7 units/ml ?INR 2.5-3.5  ?Monitor platelets by anticoagulation protocol: Yes ?  ?Plan:  ?Start heparin drip at 1150 units/hr ?  1400 heparin level ?Monitor for bleeding ?F/U plan to resume warfarin  ? ?Narda Bonds, PharmD, BCPS ?Clinical Pharmacist ?Phone: [phone] ? ? ? ?

## 2021-09-10 NOTE — Progress Notes (Addendum)
? ?Progress Note ? ?Patient Name: Abigail Oliver ?Date of Encounter: 09/10/2021 ? ?[facility] Cardiologist: Quay Burow, MD  ? ?Subjective  ? ?Continues to complain of constant chest pressure that is unchanged since admission. She is upset and doesn't know what happened in surgery. Will defer discussion to surgeon. ? ?Inpatient Medications  ?  ?Scheduled Meds: ? acetaminophen  650 mg Oral Q6H  ? aspirin EC  81 mg Oral Daily  ? dicyclomine  10 mg Oral TID AC  ? docusate sodium  100 mg Oral BID  ? feeding supplement (GLUCERNA SHAKE)  237 mL Oral TID BM  ? insulin aspart  0-15 Units Subcutaneous TID WC  ? insulin aspart  0-5 Units Subcutaneous QHS  ? latanoprost  1 drop Both Eyes QHS  ? metoprolol succinate  50 mg Oral Daily  ? multivitamin with minerals  1 tablet Oral Daily  ? mupirocin ointment  1 application. Nasal BID  ? pantoprazole  40 mg Oral Daily  ? rosuvastatin  10 mg Oral QPM  ? sertraline  25 mg Oral Daily  ? sodium chloride flush  3 mL Intravenous Q12H  ? sodium chloride flush  3 mL Intravenous Q12H  ? ?Continuous Infusions: ? sodium chloride    ? sodium chloride Stopped (09/09/21 2336)  ? methocarbamol (ROBAXIN) IV    ? ?PRN Meds: ?sodium chloride, alum & mag hydroxide-simeth, bisacodyl, fentaNYL (SUBLIMAZE) injection, methocarbamol **OR** methocarbamol (ROBAXIN) IV, metoCLOPramide **OR** metoCLOPramide (REGLAN) injection, ondansetron **OR** ondansetron (ZOFRAN) IV, oxyCODONE, polyethylene glycol, sodium chloride flush, zolpidem  ? ?Vital Signs  ?  ?Vitals:  ? 09/09/21 1130 09/09/21 1611 09/09/21 1923 09/10/21 0500  ?BP: (!) 141/54  (!) 112/42 (!) 118/46  ?Pulse: 90 68 76 79  ?Resp: 17 15 (!) 25 20  ?Temp: 98.6 ?F (37 ?C)  98.3 ?F (36.8 ?C) 98.3 ?F (36.8 ?C)  ?TempSrc:   Oral Oral  ?SpO2: 93% 94% 96% 98%  ?Weight:      ?Height:      ? ? ?Intake/Output Summary (Last 24 hours) at 09/10/2021 0608 ?Last data filed at 09/10/2021 0000 ?Gross per 24 hour  ?Intake 1635.64 ml  ?Output 1100 ml  ?Net 535.64  ml  ? ? ?  09/06/2021  ?  4:50 AM 09/04/2021  ? 11:15 AM 01/30/2021  ?  8:17 AM  ?Last 3 Weights  ?Weight (lbs) 171 lb 8.3 oz 169 lb 169 lb 6.4 oz  ?Weight (kg) 77.8 kg 76.658 kg 76.839 kg  ?   ? ?Telemetry  ?  ?Sinus to sinus bradycardia 40s-60s - Personally Reviewed ? ?ECG  ?  ?No new tracings - Personally Reviewed ? ?Physical Exam  ? ?GEN: No acute distress.   ?Neck: No JVD ?Cardiac: regular rhythm, bradycardic rate, valve click ?Respiratory: Clear to auscultation bilaterally. ?GI: Soft, nontender, non-distended  ?MS: distal pulse on right, left foot wrapped, left leg wrapped ?Neuro:  Nonfocal  ?Psych: Normal affect  ? ?Labs  ?  ?High Sensitivity Troponin:   ?Recent Labs  ?Lab 09/05/21 ?2125 09/06/21 ?0156 09/06/21 ?9371 09/06/21 ?0730 09/06/21 ?1020  ?TROPONINIHS 292* 1,271* 1,473* 1,220* 874*  ?   ?Chemistry ?Recent Labs  ?Lab 09/06/21 ?6967 09/07/21 ?1136 09/08/21 ?0448 09/09/21 ?8938 09/10/21 ?1017  ?NA 136   < > 134* 137 135  ?K 4.0   < > 4.0 3.9 4.2  ?CL 105   < > 102 105 105  ?CO2 24   < > 19* 24 19*  ?GLUCOSE 126*   < >  149* 133* 160*  ?BUN 26*   < > 44* 46* 40*  ?CREATININE 1.71*   < > 1.69* 2.05* 1.80*  ?CALCIUM 8.8*   < > 9.0 8.9 8.8*  ?MG 1.9  --  2.3  --   --   ?GFRNONAA 31*   < > 31* 25* 29*  ?ANIONGAP 7   < > '13 8 11  '$ ? < > = values in this interval not displayed.  ?  ?Lipids No results for input(s): CHOL, TRIG, HDL, LABVLDL, LDLCALC, CHOLHDL in the last 168 hours.  ?Hematology ?Recent Labs  ?Lab 09/08/21 ?0448 09/09/21 ?0329 09/10/21 ?2633  ?WBC 8.7 9.1 9.0  ?RBC 3.02* 3.01* 2.98*  ?HGB 8.3* 8.3* 8.1*  ?HCT 26.4* 26.3* 26.8*  ?MCV 87.4 87.4 89.9  ?MCH 27.5 27.6 27.2  ?MCHC 31.4 31.6 30.2  ?RDW 16.9* 17.5* 17.6*  ?PLT 130* 147* 176  ? ?Thyroid No results for input(s): TSH, FREET4 in the last 168 hours.  ?BNP ?Recent Labs  ?Lab 09/06/21 ?3545  ?BNP 1,282.7*  ?  ?DDimer No results for input(s): DDIMER in the last 168 hours.  ? ?Radiology  ?  ?DG Knee Left Port ? ?Result Date: 09/09/2021 ?CLINICAL DATA:   Status post femur fracture fixation. EXAM: PORTABLE LEFT KNEE - 1-2 VIEW COMPARISON:  CT scan 09/04/2021 and intraoperative films earlier today. FINDINGS: The long lateral femoral sideplate and compression screws are in good position without complicating features. Reduction of the intra-articular femur fracture. IMPRESSION: Internal fixation of a comminuted intra-articular femur fracture. Fixation hardware and good position without complicating features. Electronically Signed   By: Marijo Sanes M.D.   On: 09/09/2021 11:38  ? ?DG C-Arm 1-60 Min-No Report ? ?Result Date: 09/09/2021 ?Fluoroscopy was utilized by the requesting physician.  No radiographic interpretation.  ? ?DG FEMUR MIN 2 VIEWS LEFT ? ?Result Date: 09/09/2021 ?CLINICAL DATA:  Operative fluoroscopy for left distal femur ORIF. EXAM: LEFT FEMUR 2 VIEWS COMPARISON:  Pelvis and left hip radiographs 09/04/2021, left knee radiographs 09/04/2021 FINDINGS: Images were performed intraoperatively without the presence of a radiologist. The patient is undergoing lateral plate and screw fixation of the previously seen multiple distal femoral metaphyseal fracture is extending distally. Total fluoroscopy images: 7 Total fluoroscopy time: 61 seconds Total dose: Radiation Exposure Index (as provided by the fluoroscopic device): 4.86 mGy air Kerma Please see intraoperative findings for further detail. IMPRESSION: Status post ORIF of distal left femoral fractures. Electronically Signed   By: Yvonne Kendall M.D.   On: 09/09/2021 09:53   ? ?Cardiac Studies  ? ?Heart cath 09/07/21: ?  Prox LAD lesion is 30% stenosed. ?  Mid LAD lesion is 50% stenosed. ?  Dist LM to Ost LAD lesion is 40% stenosed. ?  Dist RCA lesion is 100% stenosed. ?  ?1.  Patent proximal LAD stent. ?2.  Patent RIMA to PDA. ?3.  Moderate distal left main and mid LAD disease. ?4.  Left heart catheterization was deferred due to presence of mechanical aortic valve replacement. ?  ?Recommendations: Results reviewed  with Dr. Irish Lack.  No intervention was pursued and the patient will be referred for urgent orthopedic surgery. ? ?Patient Profile  ?   ?76 y.o. female with a hx of AS s/p mechanical AVR, CAD s/p 1v CABG, DES x1 to LAD, HFpEF, HLD, Carotid stenosis, PAD s/p R SFA stent who was seen 09/06/2021 for the evaluation of chest pain at the request of Dr. Sloan Leiter. ? ?Assessment & Plan  ?  ?CAD s/p  CABG x 1 ?NSTEMI ?- heart cath with 50% lesion in the mid LAD, 100% occlusion in the distal RCA ?- RIMA-PDA patent ?- echo reassuring ?- no intervention, she proceeded to surgery yesterday ? ? ?Mechanical AVR ?- per ortho " She may be restarted on her anticoagulation tomorrow morning." - 09/09/21 ?- I have ordered heparin per pharmacy, will restart coumadin tonight ?- will need to be INR 2.5-3.5 prior to discharge ?- I have asked pharmD to price lovenox injections if she is stable for discharge prior to therapeutic INR ? ? ?Bicondylar distal femur fracture of left knee with effusion ?- underwent surgical repair yesterday  ?- as a result of mechanical fall ? ? ?Hypertension ?- continue BB ?- generally well-controlled ? ? ?CKD stage III ?- sCr 1.80 - received IVF overnight ?- continue to monitor ?- baseline near 1.6-1.7 ? ? ?Anemia ?- Hb 8.1 - stable but nearly 2 g below admission Hb of 9.9 ?- could be dilutional given IVF last night ?- no signs of active bleeding ?- consider repeating H/H at lunch ? ? ?   ? ?For questions or updates, please contact [place] ?Please consult [email or web address] for contact info under  ? ?  ?   ?Signed, ?Ledora Bottcher, PA  ?09/10/2021, 6:08 AM   ? ? ?I have examined the patient and reviewed assessment and plan and discussed with patient.  Agree with above as stated.   ? ?Doing well from cardiac standpoint.  On IV heparin.  We will restart oral Coumadin.  We will see if there is an option for Lovenox to bridge her.  For now, IV heparin.  Chronic renal insufficiency will affect her dose of  Lovenox. ? ?In general, she is unhappy as she feels she does not know what is going on from an orthopedic standpoint.  I explained to her that given the anesthesia she received, she may not remember the conversat

## 2021-09-10 NOTE — Evaluation (Signed)
Physical Therapy Evaluation ?Patient Details ?Name: Abigail Oliver ?MRN: [id number] ?DOB: 09-27-1945 ?Today's Date: 09/10/2021 ? ?History of Present Illness ? The pt is a 76 yo female presenting 4/14 after a fall at home resulting in L femoral condylar fx. RR on 4/16 due to pt developing chest pain, found to have NSTEMI. She is now s/p ORIF of L distal femur fx on 4/19. PMH includes: CAD s/p CABG, AVR on Coumadin, glaucoma, CHF, HTN, HLD, and DM II. ?  ?Clinical Impression ? Pt in bed upon arrival of PT, agreeable to evaluation at this time. Prior to admission the pt was independent with all mobility, states this is only her second fall since last November. 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 The pt now presents with limitations in functional mobility, strength, activity tolerance, memory of wt bearing precautions, and dynamic stability due to above dx, and will continue to benefit from skilled PT to address these deficits. The pt was able to complete bed mobility with minA, but needed up to modA to complete pivot transfer due to pt losing balance and picking up RW requiring increased assist. She also frequently broke TDWB order with transfer, and therefore was unable to progress ambulation at this time. Will need WC to return home and maintain TDWB orders, will benefit from HHPT to progress LE strength and capacity for OOB transfers.  ?   ?   ? ?Recommendations for follow up therapy are one component of a multi-disciplinary discharge planning process, led by the attending physician.  Recommendations may be updated based on patient status, additional functional criteria and insurance authorization. ? ?Follow Up Recommendations Home health PT ? ?  ?Assistance Recommended at Discharge Frequent or constant Supervision/Assistance  ?Patient can return home with the following ? A lot of help with walking and/or transfers;A little help with  bathing/dressing/bathroom;Assistance with cooking/housework;Assist for transportation;Help with stairs or ramp for entrance ? ?  ?Equipment Recommendations Wheelchair (measurements PT);Wheelchair cushion (measurements PT)  ?Recommendations for Other Services ?    ?  ?Functional Status Assessment Patient has had a recent decline in their functional status and demonstrates the ability to make significant improvements in function in a reasonable and predictable amount of time.  ? ?  ?Precautions / Restrictions Precautions ?Precautions: Fall ?Precaution Comments: recent NSTEMI ?Restrictions ?LLE Weight Bearing: Touchdown weight bearing ?Other Position/Activity Restrictions: unrestricted knee ROM  ? ?  ? ?Mobility ? Bed Mobility ?Overal bed mobility: Needs Assistance ?Bed Mobility: Supine to Sit ?  ?  ?Supine to sit: Min assist ?  ?  ?General bed mobility comments: minA to complete with assist to LLE ?  ? ?Transfers ?Overall transfer level: Needs assistance ?Equipment used: Rolling walker (2 wheels) ?Transfers: Sit to/from Stand, Bed to chair/wheelchair/BSC ?Sit to Stand: Min assist ?Stand pivot transfers: Mod assist ?  ?  ?  ?  ?General transfer comment: minA to stand with pt reporting dizziness and returned to sitting, then modA to pivot as pt repeatedly picking up RW and then needing increased assist to steady ?  ? ?Ambulation/Gait ?  ?  ?  ?  ?  ?  ?  ?General Gait Details: unable at this time, difficulty with pivot and pt unable to maintain TDWB with pivot ? ? ?  ? ?Balance Overall balance assessment: Needs assistance ?Sitting-balance support: No upper extremity supported ?Sitting balance-Leahy Scale: Good ?  ?  ?Standing balance support: Bilateral upper extremity supported, During functional  activity, Reliant on assistive device for balance ?Standing balance-Leahy Scale: Poor ?Standing balance comment: BUE support plus modA as pt picking up RW ?  ?  ?  ?  ?  ?  ?  ?  ?  ?  ?  ?   ? ? ? ?Pertinent Vitals/Pain Pain  Assessment ?Pain Assessment: 0-10 ?Pain Score: 3  ?Pain Location: L leg ?Pain Descriptors / Indicators: Aching, Grimacing, Guarding ?Pain Intervention(s): Monitored during session, Repositioned, Ice applied  ? ? ?Home Living Family/patient expects to be discharged to:: Private residence ?Living Arrangements: Spouse/significant other ?Available Help at Discharge: Family;Available 24 hours/day ?Type of Home: House ?Home Access: Stairs to enter ?Entrance Stairs-Rails: Left ?Entrance Stairs-Number of Steps: 4 ?  ?Home Layout: One level ?Home Equipment: Conservation officer, nature (2 wheels);Cane - single point;BSC/3in1;Shower seat;Grab bars - tub/shower;Hand held shower head ?   ?  ?Prior Function Prior Level of Function : Independent/Modified Independent;Working/employed;Driving ?  ?  ?  ?  ?  ?  ?Mobility Comments: pt not using DME, works at a salon doing hair. stays active walking and shopping ?  ?  ? ? ?Hand Dominance  ? Dominant Hand: Right ? ?  ?Extremity/Trunk Assessment  ? Upper Extremity Assessment ?Upper Extremity Assessment: Defer to OT evaluation ?  ? ?Lower Extremity Assessment ?Lower Extremity Assessment: LLE deficits/detail ?LLE Deficits / Details: limited due to pain, unable to achieve knee extension, can flex to 90 deg. reports sensation intact other than baseline neuropathy ?LLE Sensation: WNL;history of peripheral neuropathy ?  ? ?Cervical / Trunk Assessment ?Cervical / Trunk Assessment: Normal  ?Communication  ? Communication: No difficulties  ?Cognition Arousal/Alertness: Awake/alert ?Behavior During Therapy: [facility] for tasks assessed/performed ?Overall Cognitive Status: Impaired/Different from baseline ?Area of Impairment: Memory, Following commands, Safety/judgement, Problem solving ?  ?  ?  ?  ?  ?  ?  ?  ?  ?  ?Memory: Decreased recall of precautions ?Following Commands: Follows one step commands with increased time ?Safety/Judgement: Decreased awareness of safety, Decreased awareness of deficits ?  ?Problem  Solving: Slow processing, Difficulty sequencing, Requires verbal cues ?General Comments: pt needing max cues for wt bearing, picking up RW when feeling off balance, alerting PT that when she picked up RW she put her L foot down and asking if that is okay, reminded of TDWB and pt then saying "oh right" ?  ?  ? ?  ?General Comments General comments (skin integrity, edema, etc.): VSS on RA ? ?  ?Exercises General Exercises - Lower Extremity ?Ankle Circles/Pumps: AROM, Left, 10 reps ?Long Arc Quad: AROM, Left, 10 reps, Seated ?Heel Slides: AROM, Left, 10 reps, Seated  ? ?Assessment/Plan  ?  ?PT Assessment Patient needs continued PT services  ?PT Problem List Decreased strength;Decreased range of motion;Decreased activity tolerance;Decreased balance;Decreased mobility;Decreased coordination;Decreased cognition;Decreased safety awareness ? ?   ?  ?PT Treatment Interventions DME instruction;Functional mobility training;Stair training;Gait training;Therapeutic activities;Balance training;Therapeutic exercise;Patient/family education   ? ?PT Goals (Current goals can be found in the Care Plan section)  ?Acute Rehab PT Goals ?Patient Stated Goal: return home ?PT Goal Formulation: With patient ?Time For Goal Achievement: 09/24/21 ?Potential to Achieve Goals: Good ? ?  ?Frequency Min 5X/week ?  ? ? ?   ?AM-PAC PT "6 Clicks" Mobility  ?Outcome Measure Help needed turning from your back to your side while in a flat bed without using bedrails?: A Little ?Help needed moving from lying on your back to sitting on the side of a flat bed without  using bedrails?: A Little ?Help needed moving to and from a bed to a chair (including a wheelchair)?: A Little ?Help needed standing up from a chair using your arms (e.g., wheelchair or bedside chair)?: A Lot ?Help needed to walk in hospital room?: Total ?Help needed climbing 3-5 steps with a railing? : Total ?6 Click Score: 13 ? ?  ?End of Session Equipment Utilized During Treatment: Gait  belt ?Activity Tolerance: Patient tolerated treatment well;Patient limited by fatigue ?Patient left: in chair;with call bell/phone within reach ?Nurse Communication: Mobility status ?PT Visit Diagnosis: Unsteadiness on feet (R26.81);Other ab

## 2021-09-10 NOTE — Progress Notes (Signed)
TRH night cross cover note: ? ?I was notified by RN that the patient is refusing additional IV fluids. ? ?Per my chart review, including review of most recent rounding hospitalist progress note, the patient was started on normal saline at 100 cc/h around noon on 09/09/2021 and had been on this rate of IV fluids continuously until around midnight, at which time the patient is refusing additional IV fluids, prompting IVF's to be held at that time.  She remains normotensive at this time.  ? ? ? ?Babs Bertin, DO ?Hospitalist ? ?

## 2021-09-11 ENCOUNTER — Encounter (HOSPITAL_COMMUNITY): Payer: Self-pay | Admitting: Student

## 2021-09-11 DIAGNOSIS — R410 Disorientation, unspecified: Secondary | ICD-10-CM

## 2021-09-11 DIAGNOSIS — I2510 Atherosclerotic heart disease of native coronary artery without angina pectoris: Secondary | ICD-10-CM | POA: Diagnosis not present

## 2021-09-11 DIAGNOSIS — I249 Acute ischemic heart disease, unspecified: Secondary | ICD-10-CM | POA: Diagnosis not present

## 2021-09-11 DIAGNOSIS — N179 Acute kidney failure, unspecified: Secondary | ICD-10-CM | POA: Diagnosis not present

## 2021-09-11 DIAGNOSIS — Z952 Presence of prosthetic heart valve: Secondary | ICD-10-CM | POA: Diagnosis not present

## 2021-09-11 DIAGNOSIS — F419 Anxiety disorder, unspecified: Secondary | ICD-10-CM | POA: Diagnosis not present

## 2021-09-11 DIAGNOSIS — S72432A Displaced fracture of medial condyle of left femur, initial encounter for closed fracture: Secondary | ICD-10-CM | POA: Diagnosis not present

## 2021-09-11 DIAGNOSIS — S72422A Displaced fracture of lateral condyle of left femur, initial encounter for closed fracture: Secondary | ICD-10-CM | POA: Diagnosis not present

## 2021-09-11 LAB — BASIC METABOLIC PANEL
Anion gap: 8 (ref 5–15)
BUN: 38 mg/dL — ABNORMAL HIGH (ref 8–23)
CO2: 22 mmol/L (ref 22–32)
Calcium: 8.6 mg/dL — ABNORMAL LOW (ref 8.9–10.3)
Chloride: 107 mmol/L (ref 98–111)
Creatinine, Ser: 1.76 mg/dL — ABNORMAL HIGH (ref 0.44–1.00)
GFR, Estimated: 30 mL/min — ABNORMAL LOW (ref >60)
Glucose, Bld: 104 mg/dL — ABNORMAL HIGH (ref 70–99)
Potassium: 3.9 mmol/L (ref 3.5–5.1)
Sodium: 137 mmol/L (ref 135–145)

## 2021-09-11 LAB — GLUCOSE, CAPILLARY
Glucose-Capillary: 103 mg/dL — ABNORMAL HIGH (ref 70–99)
Glucose-Capillary: 98 mg/dL (ref 70–99)
Glucose-Capillary: 156 mg/dL — ABNORMAL HIGH (ref 70–99)
Glucose-Capillary: 106 mg/dL — ABNORMAL HIGH (ref 70–99)

## 2021-09-11 LAB — CBC
HCT: 23.4 % — ABNORMAL LOW (ref 36.0–46.0)
Hemoglobin: 7.4 g/dL — ABNORMAL LOW (ref 12.0–15.0)
MCH: 27.8 pg (ref 26.0–34.0)
MCHC: 31.6 g/dL (ref 30.0–36.0)
MCV: 88 fL (ref 80.0–100.0)
Platelets: 134 10*3/uL — ABNORMAL LOW (ref 150–400)
RBC: 2.66 MIL/uL — ABNORMAL LOW (ref 3.87–5.11)
RDW: 17.7 % — ABNORMAL HIGH (ref 11.5–15.5)
WBC: 6.2 10*3/uL (ref 4.0–10.5)
nRBC: 0.6 % — ABNORMAL HIGH (ref 0.0–0.2)

## 2021-09-11 LAB — PROTIME-INR
INR: 1.8 — ABNORMAL HIGH (ref 0.8–1.2)
Prothrombin Time: 20.8 seconds — ABNORMAL HIGH (ref 11.4–15.2)

## 2021-09-11 LAB — HEPARIN LEVEL (UNFRACTIONATED): Heparin Unfractionated: <0.1 IU/mL — ABNORMAL LOW (ref 0.30–0.70)

## 2021-09-11 LAB — IRON AND TIBC
Iron: 23 ug/dL — ABNORMAL LOW (ref 28–170)
Saturation Ratios: 8 % — ABNORMAL LOW (ref 10.4–31.8)
TIBC: 298 ug/dL (ref 250–450)
UIBC: 275 ug/dL

## 2021-09-11 LAB — VITAMIN B12: Vitamin B-12: 260 pg/mL (ref 180–914)

## 2021-09-11 LAB — FERRITIN: Ferritin: 111 ng/mL (ref 11–307)

## 2021-09-11 LAB — FOLATE: Folate: 57.9 ng/mL (ref >5.9)

## 2021-09-11 MED ORDER — METOPROLOL SUCCINATE ER 50 MG PO TB24
75.0000 mg | ORAL_TABLET | Freq: Every day | ORAL | Status: DC
  Administered 2021-09-12: 75 mg via ORAL
  Filled 2021-09-11: qty 1

## 2021-09-11 MED ORDER — SODIUM CHLORIDE 0.9 % IV SOLN
250.0000 mg | Freq: Once | INTRAVENOUS | Status: AC
  Administered 2021-09-11: 250 mg via INTRAVENOUS
  Filled 2021-09-11: qty 20

## 2021-09-11 MED ORDER — ENOXAPARIN SODIUM 80 MG/0.8ML IJ SOSY
80.0000 mg | PREFILLED_SYRINGE | INTRAMUSCULAR | Status: DC
Start: 2021-09-11 — End: 2021-09-12
  Administered 2021-09-11 – 2021-09-12 (×2): 80 mg via SUBCUTANEOUS
  Filled 2021-09-11 (×2): qty 0.8

## 2021-09-11 MED ORDER — TORSEMIDE 20 MG PO TABS
40.0000 mg | ORAL_TABLET | Freq: Every day | ORAL | Status: DC
  Administered 2021-09-11 – 2021-09-12 (×2): 40 mg via ORAL
  Filled 2021-09-11 (×2): qty 2

## 2021-09-11 MED ORDER — QUETIAPINE FUMARATE 50 MG PO TABS
50.0000 mg | ORAL_TABLET | Freq: Two times a day (BID) | ORAL | Status: DC | PRN
  Administered 2021-09-11: 50 mg via ORAL
  Filled 2021-09-11: qty 1

## 2021-09-11 MED ORDER — WARFARIN SODIUM 2.5 MG PO TABS
4.5000 mg | ORAL_TABLET | Freq: Once | ORAL | Status: AC
  Administered 2021-09-11: 4.5 mg via ORAL
  Filled 2021-09-11: qty 1

## 2021-09-11 NOTE — Care Management Important Message (Signed)
Important Message ? ?Patient Details  ?Name: Abigail Oliver ?MRN: [id number] ?Date of Birth: 1946-04-04 ? ? ?Medicare Important Message Given:  Yes ? ? ? ? ?Shelda Altes ?09/11/2021, 9:57 AM ?

## 2021-09-11 NOTE — Assessment & Plan Note (Signed)
Patient somewhat disoriented overnight, attributable to Ambien. ?- Stop Ambien ?

## 2021-09-11 NOTE — Anesthesia Postprocedure Evaluation (Signed)
Anesthesia Post Note ? ?Patient: Abigail Oliver ? ?Procedure(s) Performed: OPEN REDUCTION INTERNAL FIXATION (ORIF) DISTAL FEMUR FRACTURE (Left: Leg Upper) ? ?  ? ?Patient location during evaluation: PACU ?Anesthesia Type: General ?Level of consciousness: sedated and patient cooperative ?Pain management: pain level controlled ?Vital Signs Assessment: post-procedure vital signs reviewed and stable ?Respiratory status: spontaneous breathing ?Cardiovascular status: stable ?Anesthetic complications: no ? ? ?No notable events documented. ? ?Last Vitals:  ?Vitals:  ? 09/11/21 0610 09/11/21 1015  ?BP: 121/66 133/67  ?Pulse: 67 70  ?Resp:  17  ?Temp: 36.5 ?C 36.7 ?C  ?SpO2: 100% 100%  ?  ?Last Pain:  ?Vitals:  ? 09/11/21 1015  ?TempSrc: Oral  ?PainSc: 5   ? ? ?  ?  ?  ?  ?  ?  ? ?Nolon Nations ? ? ? ? ?

## 2021-09-11 NOTE — Progress Notes (Signed)
2115-Pt's behavior was normal at the start of the shift also able to educate pt about pain medications and controlling surgical pain.  ? ?0015- Pt confused wanting to leave hospital. Pt ripped IV in half and took off all arm bands. Pt is refusing to stay in bed but agreed to get in recliner. Alarmed placed back on pt after moving pt to recliner. Pt is refusing care from nurse at this time. Attempted to reeducate pt about safety measures.  ? ? ?Notified Dr. Lavona Mound about change with pt. New orders received to obtain a UA, give pt Seroquel 50 mg, and Ambien was discontinued. Will attempt after intervention to obtain another IV access on pt. Will continue to monitor pt closely.  ?

## 2021-09-11 NOTE — Progress Notes (Signed)
TRH night cross cover note: ? ?I was notified by RN that the patient is confused and agitated this evening, representing a change from her baseline mental status.  In the setting of this agitation/confusion, the patient has pulled out her peripheral IV through which she was receiving heparin drip as a bridging mechanism in the setting of being chronically anticoagulated on warfarin for a history of mechanical aortic valve replacement, noting that her warfarin was transiently held in the setting of her presenting left intercondylar femur fracture status post ORIF during this hospitalization, with resumption of warfarin occurring on 09/10/2021, with most recent INR noted to be 1.7 on 09/10/21 ? ?In the setting of current confusion/agitation, the patient has also been getting out of bed and refusing additional care, including reestablishment of peripheral IV access.  ? ?Most recent CBG at 2113 noted to be 123.  Vital signs appear stable, with the patient afebrile, normotensive blood pressures, and oxygen saturations in the low to mid 90s on room air.  Considering any potential pharmacologic contributions, her current central acting medications include Zoloft, which she is on as an outpatient, Ambien 5 mg nightly as needed, with most recent dose occurring at 2117 09/10/2021, as well as as needed oxycodone IR, with most recent dose noted to be 5 mg at [place] on 09/10/21.  ? ?Verbal redirection and de-escalation efforts underway, with goal of reestablishing peripheral IV access for resumption of heparin drip, as above.  To assist with this process, I have also placed order for as needed Seroquel for agitation.  Refraining from restraints at this time, giving preference to verbal redirection and as needed Seroquel.  ? ?I have also discontinued prn order for Ambien and ordered urinalysis. ? ? ? ?Babs Bertin, DO ?Hospitalist ? ?

## 2021-09-11 NOTE — Progress Notes (Addendum)
? ?Progress Note ? ?Patient Name: Abigail Oliver ?Date of Encounter: 09/11/2021 ? ?[facility] Cardiologist: Quay Burow, MD  ? ?Subjective  ? ?Patient denies chest pain, palpitations, sob, dizziness. Only complains of pain in her left knee  ? ?Inpatient Medications  ?  ?Scheduled Meds: ? acetaminophen  650 mg Oral Q6H  ? aspirin EC  81 mg Oral Daily  ? dicyclomine  10 mg Oral TID AC  ? docusate sodium  100 mg Oral BID  ? enoxaparin (LOVENOX) injection  80 mg Subcutaneous Q24H  ? feeding supplement (GLUCERNA SHAKE)  237 mL Oral TID BM  ? insulin aspart  0-15 Units Subcutaneous TID WC  ? insulin aspart  0-5 Units Subcutaneous QHS  ? latanoprost  1 drop Both Eyes QHS  ? metoprolol succinate  50 mg Oral Daily  ? multivitamin with minerals  1 tablet Oral Daily  ? mupirocin ointment  1 application. Nasal BID  ? pantoprazole  40 mg Oral Daily  ? rosuvastatin  10 mg Oral QPM  ? sertraline  25 mg Oral Daily  ? sodium chloride flush  3 mL Intravenous Q12H  ? sodium chloride flush  3 mL Intravenous Q12H  ? warfarin  4.5 mg Oral ONCE-1600  ? Warfarin - Pharmacist Dosing Inpatient   Does not apply O7078  ? ?Continuous Infusions: ? sodium chloride    ? ferric gluconate (FERRLECIT) IVPB 250 mg (09/11/21 1035)  ? methocarbamol (ROBAXIN) IV    ? ?PRN Meds: ?sodium chloride, alum & mag hydroxide-simeth, bisacodyl, fentaNYL (SUBLIMAZE) injection, methocarbamol **OR** methocarbamol (ROBAXIN) IV, metoCLOPramide **OR** metoCLOPramide (REGLAN) injection, ondansetron **OR** ondansetron (ZOFRAN) IV, oxyCODONE, polyethylene glycol, QUEtiapine, sodium chloride flush  ? ?Vital Signs  ?  ?Vitals:  ? 09/10/21 1935 09/10/21 2302 09/11/21 0610 09/11/21 1015  ?BP: (!) 124/42 (!) 126/55 121/66 133/67  ?Pulse: (!) 45 (!) 58 67 70  ?Resp: '20 18  17  '$ ?Temp: 98.9 ?F (37.2 ?C) 98.1 ?F (36.7 ?C) 97.7 ?F (36.5 ?C) 98 ?F (36.7 ?C)  ?TempSrc: Oral Oral Axillary Oral  ?SpO2: 91% 92% 100% 100%  ?Weight:      ?Height:      ? ? ?Intake/Output Summary  (Last 24 hours) at 09/11/2021 1051 ?Last data filed at 09/11/2021 0600 ?Gross per 24 hour  ?Intake 1146.8 ml  ?Output --  ?Net 1146.8 ml  ? ? ?  09/06/2021  ?  4:50 AM 09/04/2021  ? 11:15 AM 01/30/2021  ?  8:17 AM  ?Last 3 Weights  ?Weight (lbs) 171 lb 8.3 oz 169 lb 169 lb 6.4 oz  ?Weight (kg) 77.8 kg 76.658 kg 76.839 kg  ?   ? ?Telemetry  ?  ?Patient not on telemetry - Personally Reviewed ? ?ECG  ?  ?Sinus rhythm with PVCs, nonspecific ST and T wave abnormalities, prolonged QT  - Personally Reviewed ? ?Physical Exam  ? ?GEN: No acute distress. Sitting comfortably in the chair  ?Neck: No JVD ?Cardiac: Regularly irregular rate (consistent with ventricular bigeminy). Mechanical S2 click, no murmurs, rubs, or gallops. Radial pulses 2+ bilaterally  ?Respiratory: Clear to auscultation bilaterally. ?GI: Soft, nontender, non-distended  ?MS: No edema; No deformity. S/p surgery on her right knee, surgical site stable.  ?Neuro:  Nonfocal  ?Psych: Normal affect  ? ?Labs  ?  ?High Sensitivity Troponin:   ?Recent Labs  ?Lab 09/05/21 ?2125 09/06/21 ?0156 09/06/21 ?6754 09/06/21 ?0730 09/06/21 ?1020  ?TROPONINIHS 292* 1,271* 1,473* 1,220* 874*  ?   ?Chemistry ?Recent Labs  ?Lab  09/06/21 ?1610 09/07/21 ?1136 09/08/21 ?0448 09/09/21 ?9604 09/10/21 ?5409 09/11/21 ?8119  ?NA 136   < > 134* 137 135 137  ?K 4.0   < > 4.0 3.9 4.2 3.9  ?CL 105   < > 102 105 105 107  ?CO2 24   < > 19* 24 19* 22  ?GLUCOSE 126*   < > 149* 133* 160* 104*  ?BUN 26*   < > 44* 46* 40* 38*  ?CREATININE 1.71*   < > 1.69* 2.05* 1.80* 1.76*  ?CALCIUM 8.8*   < > 9.0 8.9 8.8* 8.6*  ?MG 1.9  --  2.3  --   --   --   ?GFRNONAA 31*   < > 31* 25* 29* 30*  ?ANIONGAP 7   < > '13 8 11 8  '$ ? < > = values in this interval not displayed.  ?  ?Lipids No results for input(s): CHOL, TRIG, HDL, LABVLDL, LDLCALC, CHOLHDL in the last 168 hours.  ?Hematology ?Recent Labs  ?Lab 09/09/21 ?0329 09/10/21 ?1478 09/11/21 ?2956  ?WBC 9.1 9.0 6.2  ?RBC 3.01* 2.98* 2.66*  ?HGB 8.3* 8.1* 7.4*  ?HCT  26.3* 26.8* 23.4*  ?MCV 87.4 89.9 88.0  ?MCH 27.6 27.2 27.8  ?MCHC 31.6 30.2 31.6  ?RDW 17.5* 17.6* 17.7*  ?PLT 147* 176 134*  ? ?Thyroid No results for input(s): TSH, FREET4 in the last 168 hours.  ?BNP ?Recent Labs  ?Lab 09/06/21 ?2130  ?BNP 1,282.7*  ?  ?DDimer No results for input(s): DDIMER in the last 168 hours.  ? ?Radiology  ?  ?DG Knee Left Port ? ?Result Date: 09/09/2021 ?CLINICAL DATA:  Status post femur fracture fixation. EXAM: PORTABLE LEFT KNEE - 1-2 VIEW COMPARISON:  CT scan 09/04/2021 and intraoperative films earlier today. FINDINGS: The long lateral femoral sideplate and compression screws are in good position without complicating features. Reduction of the intra-articular femur fracture. IMPRESSION: Internal fixation of a comminuted intra-articular femur fracture. Fixation hardware and good position without complicating features. Electronically Signed   By: Marijo Sanes M.D.   On: 09/09/2021 11:38   ? ?Cardiac Studies  ? ?Heart cath 09/07/21: ?  Prox LAD lesion is 30% stenosed. ?  Mid LAD lesion is 50% stenosed. ?  Dist LM to Ost LAD lesion is 40% stenosed. ?  Dist RCA lesion is 100% stenosed. ?  ?1.  Patent proximal LAD stent. ?2.  Patent RIMA to PDA. ?3.  Moderate distal left main and mid LAD disease. ?4.  Left heart catheterization was deferred due to presence of mechanical aortic valve replacement. ?  ?Recommendations: Results reviewed with Dr. Irish Lack.  No intervention was pursued and the patient will be referred for urgent orthopedic surgery. ? ?Patient Profile  ?   ?76 y.o. female  with a hx of AS s/p mechanical AVR, CAD s/p 1v CABG, DES x1 to LAD, HFpEF, HLD, Carotid stenosis, PAD s/p R SFA stent who was seen 09/06/2021 for the evaluation of chest pain at the request of Dr. Sloan Leiter ? ?Assessment & Plan  ?  ?CAD s/p CABG x 1 ?NSTEMI ?- heart cath this admission with 50% lesion in the mid LAD, 100% occlusion in the distal RCA ?- RIMA-PDA patent ?- echo reassuring ?- no intervention, she  proceeded to have knee surgery on 4/19  ?- Continue aspirin, metoprolol, crestor  ? ?Ventricular Bigeminy  ?PVCs  ?- Patient not on telemetry, but EKG from 4/20 and 4/16 showed frequent PVCs/Ventricular bigeminy. Patient is asymptomatic  ?- Continue increase  metoprolol to 75 mg daily  ?- Maintain K>4, mag >2 ? ?Mechanical AVR ?- per ortho " She may be restarted on her anticoagulation tomorrow morning." - 09/09/21 ?- Patient is now back on warfarin, dosed per pharmacy.  ?- Patient was being bridged with IV heparin, however IV was pulled by the patient. Transitioned to SQ enoxaparin for bridging  ?- will need to be INR 2.5-3.5 prior to discharge. INR was 1.8 this AM  ? ?Bicondylar distal femur fracture of left knee with effusion ?- underwent surgical repair on 4/19 ?- as a result of mechanical fall ?  ?Hypertension ?- continue BB ?- generally well-controlled ?  ?CKD stage III ?- sCr 1.76 this AM, improving from a peat of 2.05 on 4/19  ?- continue to monitor, avoid nephrotoxic medications  ?- baseline near 1.6-1.7 ?  ?Anemia ?- Hb 7.4 this AM, somewhat decreased from 9.9 on admission  ?- no signs of active bleeding ?- consider repeating H/H at lunch ?- Managed per primary  ? ?   ? ?For questions or updates, please contact [place] ?Please consult [email or web address] for contact info under  ? ?  ?   ?Signed, ?Margie Billet, PA-C  ?09/11/2021, 10:51 AM   ? ?I have examined the patient and reviewed assessment and plan and discussed with patient.  Agree with above as stated.  DOing well from cardiac standpoint.  Plan for heparin drip until INR is therapeutic.  WIll verify what outpatient target INR for her AVR is; may be 2.0 -3.0.    ?Getting PT post knee surgery. ? ?Given CAD and anemia, may need to consider transfusion if Hbg drop.  Perhaps IV iron would be a therapy as well to expedite resolution of anemia.  ? ?Larae Grooms  ? ?

## 2021-09-11 NOTE — Progress Notes (Signed)
Orthopaedic Trauma Progress Note ? ?SUBJECTIVE: Doing okay today.  Continues to have pain in the leg, medications helping some.  No chest pain. No SOB. No nausea/vomiting. No other complaints. Wants to go home.  No family at bedside currently. ? ?OBJECTIVE:  ?Vitals:  ? 09/11/21 0610 09/11/21 1015  ?BP: 121/66 133/67  ?Pulse: 67 70  ?Resp:  17  ?Temp: 97.7 ?F (36.5 ?C) 98 ?F (36.7 ?C)  ?SpO2: 100% 100%  ? ? ?General: Sitting up in bedside chair, no acute distress ?Respiratory: No increased work of breathing.  ?Left lower extremity: Dressing removed, incisions are clean, dry, intact.  Tenderness throughout the distal thigh as expected.  No significant calf tenderness.  Tolerates gentle ankle and knee range of motion.  Able to get full knee extension.  Able to wiggle toes.  Endorses sensation to light touch throughout the extremity.  Compartments remain soft and compressible. + DP pulse ? ?IMAGING: Stable post op imaging.  ? ?LABS:  ?Results for orders placed or performed during the hospital encounter of 09/04/21 (from the past 24 hour(s))  ?Heparin level (unfractionated)     Status: None  ? Collection Time: 09/10/21  1:51 PM  ?Result Value Ref Range  ? Heparin Unfractionated 0.35 0.30 - 0.70 IU/mL  ?Glucose, capillary     Status: Abnormal  ? Collection Time: 09/10/21  5:32 PM  ?Result Value Ref Range  ? Glucose-Capillary 103 (H) 70 - 99 mg/dL  ?Glucose, capillary     Status: Abnormal  ? Collection Time: 09/10/21  9:12 PM  ?Result Value Ref Range  ? Glucose-Capillary 123 (H) 70 - 99 mg/dL  ?Heparin level (unfractionated)     Status: Abnormal  ? Collection Time: 09/11/21  6:56 AM  ?Result Value Ref Range  ? Heparin Unfractionated <0.10 (L) 0.30 - 0.70 IU/mL  ?CBC     Status: Abnormal  ? Collection Time: 09/11/21  6:56 AM  ?Result Value Ref Range  ? WBC 6.2 4.0 - 10.5 K/uL  ? RBC 2.66 (L) 3.87 - 5.11 MIL/uL  ? Hemoglobin 7.4 (L) 12.0 - 15.0 g/dL  ? HCT 23.4 (L) 36.0 - 46.0 %  ? MCV 88.0 80.0 - 100.0 fL  ? MCH 27.8 26.0  - 34.0 pg  ? MCHC 31.6 30.0 - 36.0 g/dL  ? RDW 17.7 (H) 11.5 - 15.5 %  ? Platelets 134 (L) 150 - 400 K/uL  ? nRBC 0.6 (H) 0.0 - 0.2 %  ?Protime-INR     Status: Abnormal  ? Collection Time: 09/11/21  6:56 AM  ?Result Value Ref Range  ? Prothrombin Time 20.8 (H) 11.4 - 15.2 seconds  ? INR 1.8 (H) 0.8 - 1.2  ?Ferritin     Status: None  ? Collection Time: 09/11/21  6:56 AM  ?Result Value Ref Range  ? Ferritin 111 11 - 307 ng/mL  ?Iron and TIBC     Status: Abnormal  ? Collection Time: 09/11/21  6:56 AM  ?Result Value Ref Range  ? Iron 23 (L) 28 - 170 ug/dL  ? TIBC 298 250 - 450 ug/dL  ? Saturation Ratios 8 (L) 10.4 - 31.8 %  ? UIBC 275 ug/dL  ?Folate, serum, performed at [facility] lab     Status: None  ? Collection Time: 09/11/21  6:56 AM  ?Result Value Ref Range  ? Folate 57.9 >5.9 ng/mL  ?Vitamin B12     Status: None  ? Collection Time: 09/11/21  6:56 AM  ?Result Value Ref Range  ?  Vitamin B-12 260 180 - 914 pg/mL  ?Basic metabolic panel     Status: Abnormal  ? Collection Time: 09/11/21  6:56 AM  ?Result Value Ref Range  ? Sodium 137 135 - 145 mmol/L  ? Potassium 3.9 3.5 - 5.1 mmol/L  ? Chloride 107 98 - 111 mmol/L  ? CO2 22 22 - 32 mmol/L  ? Glucose, Bld 104 (H) 70 - 99 mg/dL  ? BUN 38 (H) 8 - 23 mg/dL  ? Creatinine, Ser 1.76 (H) 0.44 - 1.00 mg/dL  ? Calcium 8.6 (L) 8.9 - 10.3 mg/dL  ? GFR, Estimated 30 (L) >60 mL/min  ? Anion gap 8 5 - 15  ?Glucose, capillary     Status: Abnormal  ? Collection Time: 09/11/21  8:32 AM  ?Result Value Ref Range  ? Glucose-Capillary 103 (H) 70 - 99 mg/dL  ? ?*Note: Due to a large number of results and/or encounters for the requested time period, some results have not been displayed. A complete set of results can be found in Results Review.  ? ? ?ASSESSMENT: Abigail Oliver is a 76 y.o. female, 2 Days Post-Op s/p ?ORIF LEFT DISTAL FEMUR FRACTURE ? ?CV/Blood loss: Acute blood loss anemia, Hgb 7.4 this morning.   ? ?PLAN: ?Weightbearing: TDWB LLE ?ROM: Okay for unrestricted knee ROM as  tolerated ?Incisional and dressing care: Dressings removed today, okay to leave incisions open to air ?Showering: Okay to begin showering getting incisions wet 09/12/2021 ?Orthopedic device(s): None  ?Pain management:  ?1. Tylenol 650 mg q 6 hours scheduled ?2. Robaxin 500 mg q 6 hours PRN ?3. Oxycodone 5-10 mg q 4 hours PRN ?4. Fentanyl 12.5 mg q 2 hours PRN ?VTE prophylaxis: Coumadin and heparin , SCDs ?ID:  Ancef 2gm post op completed ?Foley/Lines: No foley, KVO IVFs ?Impediments to Fracture Healing: Vitamin D level 35.  Currently on 5000 units vitamin D3 daily at baseline, no additional supplementation indicated. ?Dispo: Therapies as tolerated, PT/OT recommending home health.  Patient stable for discharge from ortho standpoint once cleared by therapies and medicine team.    ? ?D/C recommendations: ?- Oxycodone 5 mg and Robaxin 500 mg for pain control ?- Continue home dose anticoagulation ?- No additional Vit D supplementation needed ? ?Follow - up plan: 2 weeks ? ? ?Contact information:  Katha Hamming MD, Rushie Nyhan PA-C. After hours and holidays please check [email or web address] for group call information for [facility] ? ? ?Gwinda Passe, PA-C ?[phone] (office) ?[email or web address] ? ? ? ?

## 2021-09-11 NOTE — Progress Notes (Signed)
Physical Therapy Treatment ?Patient Details ?Name: Abigail Oliver ?MRN: [id number] ?DOB: May 09, 1946 ?Today's Date: 09/11/2021 ? ? ?History of Present Illness 76 yo female adm 4/14 after a fall at home with L fem condylar fx. 4/16 developed chest pain with NSTEMI. s/p ORIF of L femur 4/19. PMHx: CAD s/p CABG, AVR on Coumadin, glaucoma, CHF, HTN, HLD, anxiety, neuropathy and DM II. ? ?  ?PT Comments  ? ? Pt pleasant and stating interraction with ambien for reason in cognitive changes previously. Pt able to progress mobility to limited gait and pivots with cues for safety and sequence. Continue to recommend WC mobility for in home for greater ease and safety with pt able to verbalize sequence for transfers and educated for HEP. Will continue to follow.  ?   ?Recommendations for follow up therapy are one component of a multi-disciplinary discharge planning process, led by the attending physician.  Recommendations may be updated based on patient status, additional functional criteria and insurance authorization. ? ?Follow Up Recommendations ? Home health PT ?  ?  ?Assistance Recommended at Discharge Frequent or constant Supervision/Assistance  ?Patient can return home with the following A lot of help with walking and/or transfers;A little help with bathing/dressing/bathroom;Assistance with cooking/housework;Assist for transportation;Help with stairs or ramp for entrance ?  ?Equipment Recommendations ? Wheelchair (measurements PT);Wheelchair cushion (measurements PT);BSC/3in1  ?  ?Recommendations for Other Services   ? ? ?  ?Precautions / Restrictions Precautions ?Precautions: Fall ?Restrictions ?LLE Weight Bearing: Touchdown weight bearing ?Other Position/Activity Restrictions: unrestricted knee ROM  ?  ? ?Mobility ? Bed Mobility ?  ?  ?  ?  ?  ?  ?  ?General bed mobility comments: in recliner on arrival and end of session ?  ? ?Transfers ?Overall transfer level: Needs assistance ?  ?Transfers: Sit to/from Stand ?Sit to  Stand: Min guard ?  ?  ?  ?  ?  ?General transfer comment: cues for hand placement and to maintain TDWB statut with transfers. performed x 2 ?  ? ?Ambulation/Gait ?Ambulation/Gait assistance: Min guard ?Gait Distance (Feet): 5 Feet ?Assistive device: Rolling walker (2 wheels) ?Gait Pattern/deviations: Step-to pattern ?  ?Gait velocity interpretation: <1.31 ft/sec, indicative of household ambulator ?  ?General Gait Details: pt able to perform short hops 5' x 2 with mod cues for sequence and safety to back to surface before sitting, pt prematurely sitting x 2 with assist to descend safely, able to maintain TDWB ? ? ?Stairs ?  ?  ?  ?  ?  ? ? ?Wheelchair Mobility ?  ? ?Modified Rankin (Stroke Patients Only) ?  ? ? ?  ?Balance Overall balance assessment: Needs assistance ?Sitting-balance support: No upper extremity supported ?Sitting balance-Leahy Scale: Good ?  ?  ?Standing balance support: Bilateral upper extremity supported, During functional activity, Reliant on assistive device for balance ?Standing balance-Leahy Scale: Poor ?Standing balance comment: bil UE and min assist ?  ?  ?  ?  ?  ?  ?  ?  ?  ?  ?  ?  ? ?  ?Cognition Arousal/Alertness: Awake/alert ?Behavior During Therapy: [facility] for tasks assessed/performed ?Overall Cognitive Status: Impaired/Different from baseline ?Area of Impairment: Memory ?  ?  ?  ?  ?  ?  ?  ?  ?  ?  ?Memory: Decreased short-term memory ?Following Commands: Follows one step commands with increased time ?Safety/Judgement: Decreased awareness of safety ?  ?Problem Solving: Slow processing ?General Comments: pt with improved mentation than earlier today and able  to state precuations and medication interaction. Pt sitting prematurely x 2 ?  ?  ? ?  ?Exercises General Exercises - Lower Extremity ?Long Arc Quad: AAROM, Left, Seated, 10 reps ?Hip Flexion/Marching: AROM, Left, Seated, 10 reps ? ?  ?General Comments   ?  ?  ? ?Pertinent Vitals/Pain Pain Assessment ?Pain Score: 3  ?Pain Location:  LLE ?Pain Descriptors / Indicators: Sore ?Pain Intervention(s): Limited activity within patient's tolerance, Monitored during session, Repositioned  ? ? ?Home Living   ?  ?  ?  ?  ?  ?  ?  ?  ?  ?   ?  ?Prior Function    ?  ?  ?   ? ?PT Goals (current goals can now be found in the care plan section) Progress towards PT goals: Progressing toward goals ? ?  ?Frequency ? ? ? Min 5X/week ? ? ? ?  ?PT Plan Current plan remains appropriate  ? ? ?Co-evaluation   ?  ?  ?  ?  ? ?  ?AM-PAC PT "6 Clicks" Mobility   ?Outcome Measure ? Help needed turning from your back to your side while in a flat bed without using bedrails?: A Little ?Help needed moving from lying on your back to sitting on the side of a flat bed without using bedrails?: A Little ?Help needed moving to and from a bed to a chair (including a wheelchair)?: A Little ?Help needed standing up from a chair using your arms (e.g., wheelchair or bedside chair)?: A Little ?Help needed to walk in hospital room?: A Lot ?Help needed climbing 3-5 steps with a railing? : Total ?6 Click Score: 15 ? ?  ?End of Session   ?Activity Tolerance: Patient tolerated treatment well;Patient limited by fatigue ?Patient left: in chair;with call bell/phone within reach;with chair alarm set ?Nurse Communication: Mobility status ?PT Visit Diagnosis: Unsteadiness on feet (R26.81);Other abnormalities of gait and mobility (R26.89);History of falling (Z91.81) ?  ? ? ?Time: 1914-7829 ?PT Time Calculation (min) (ACUTE ONLY): 18 min ? ?Charges:  $Therapeutic Activity: 8-22 mins          ?          ? ?Yavier Snider P, PT ?Acute Rehabilitation Services ?Pager: [phone] ?Office: [phone] ? ? ? ?Cortlandt Capuano B Victorious Kundinger ?09/11/2021, 11:50 AM ? ?

## 2021-09-11 NOTE — Progress Notes (Signed)
PT Cancellation Note ? ?Patient Details ?Name: Abigail Oliver ?MRN: [id number] ?DOB: [birth year] ? ? ?Cancelled Treatment:    Reason Eval/Treat Not Completed: Patient declined, no reason specified (pt slightly agitated stating no sleep and confused. Currently refusing participation but agreeable to potentially attempt later today) ? ? ?[name] [name] ?09/11/2021, 10:08 AM ?Bazil Dhanani P, PT ?Acute Rehabilitation Services ?Pager: [phone] ?Office: [phone] ? ?

## 2021-09-11 NOTE — Progress Notes (Signed)
?Progress Note ? ? ?Patient: ODYSSEY VASBINDER BJS:[id number] DOB: 04-16-1946 DOA: 09/04/2021     7 ?DOS: the patient was seen and examined on 09/11/2021 at 10:30AM ?  ? ? ? ?Brief hospital course: ?Mrs. Val is a 76 yo. F with CAD s/p CABG, PCI last 2015, hx AVR on warfarin, DCHF, HTN, HLD, and DM who presented with fall, found to have  left intercondylar fracture of femur with intra-articular extension.  Admitted for surgical intervention. ? ?While in the hospital, developed chest discomfort, elevated troponins.  Subsequent cardiac cath showed 50% LAD, 100% distal RCA, RIMA-PDA patent. Cardiology optimized medications and cleared for surgery. ? ? ? ? ?Assessment and Plan: ?* Closed bicondylar fracture of distal femur ([place]) ?S/p ORIF LEFT intra-articular distal femur fracture by Dr. Doreatha Martin on 4/19 ? ?PT and OT recommend [place] therapy ?- TDWB to left ? ?- Unrestricted ROM to left ?- Follow up with Ortho in 2 weeks ? ? ?NSTEMI (non-ST elevated myocardial infarction) ([place]) ? -Continue aspirin, metoprolol, Crestor ? ?AKI (acute kidney injury) ([place]) ?Baseline Appears to be around 1.3-1.6 ? ?H/O aortic valve replacement ?Discused with pharmacy ?- Continue warfarin ?- Start Lovenox ?- Patient right at the cusp of once daily or BID dosing. ?- Trend Cr  ? ? ? ?Delirium ?Patient somewhat disoriented overnight, attributable to Ambien. ?- Stop Ambien ? ?Iron deficiency anemia ?Baseline Hgb 11, here down to 8s, stable, then 7.4 this morning post op ?Iron studies show very low iron sat.  B12 low normal ?- Give IV iron ?- Repeat iron studies in 2 months ?- Start B12 ?- Check B12 in 2 months  ? ?Anxiety ?-Continue sertraline ?  ? ?Chronic diastolic heart failure ([place]) ?Appears euvolemic ?-Resume torsemide ?  ?   ? ?Type II diabetes mellitus with peripheral autonomic neuropathy (H[facility]) ?Glucose good ?-Continue SS corrections ?-Hold metformin ? ? ? ? ? ? ? ? ? ?Subjective: Patient's left leg is somewhat sore, but otherwise she is doing  well, no dizziness, dyspnea, swelling, palpitations, confusion.  She had some irritation, slight disorientation overnight as a result of taking Ambien which she is not used to. ? ? ? ? ?Physical Exam: ?Vitals:  ? 09/10/21 2302 09/11/21 0610 09/11/21 1015 09/11/21 1356  ?BP: (!) 126/55 121/66 133/67   ?Pulse: (!) 58 67 70   ?Resp: 18  17   ?Temp: 98.1 ?F (36.7 ?C) 97.7 ?F (36.5 ?C) 98 ?F (36.7 ?C) 99.1 ?F (37.3 ?C)  ?TempSrc: Oral Axillary Oral Oral  ?SpO2: 92% 100% 100%   ?Weight:      ?Height:      ? ?Elderly adult female, sitting up in recliner, interactive and appropriate ?RRR, no murmurs, no peripheral edema ?Lungs clear without rales or wheezes ?Left leg still wrapped, her range of motion is slightly limited by pain ?Her attention normal, affect appropriate, judgment insight appear normal, she is oriented to person, place, time, and situation, face symmetric, speech fluent, moves upper extremities with normal strength, left leg movement limited by pain ? ?Data Reviewed: ?Discussed with pharmacy, cardiology and orthopedics notes reviewed. ?Nursing notes reviewed, vital signs reviewed. ?Labs are notable for creatinine down to 1.76 ?Iron studies notable for low iron saturation ? ?Family Communication:  ? ? ? ?Disposition: ?Status is: Inpatient ?The patient is cleared from the standpoint of her surgery to be discharged.  She will go home with home health. ? ?However she will need bridging with Lovenox, and at present her creatinine is right at the threshold  of whether we will give once daily or twice daily Lovenox. ? ?We will plan to recheck hemoglobin and creatinine tomorrow.  If her hemoglobin is stable and her creatinine has trended below 1.6, we will switch to twice daily Lovenox at discharge, otherwise reliever once daily and discharged with close INR follow-up ? ? ? ? ? ? ? ? ? ? ? ? ? ? ?Author: ?Edwin Dada, MD ?09/11/2021 2:31 PM ? ?For on call review [email or web address].  ? ? ?

## 2021-09-11 NOTE — Progress Notes (Addendum)
ANTICOAGULATION CONSULT NOTE - Follow Up Consult ? ?Pharmacy Consult for warfarin ?Indication: mAVR ? ?Allergies  ?Allergen Reactions  ? Atorvastatin   ?  Muscle pain  ? Dapagliflozin   ?  Other reaction(s): nausea  ? Gabapentin   ?  Other reaction(s): stomach issues  ? Simvastatin Other (See Comments)  ?  Muscle pain  ? Sulfa Antibiotics   ?  unknown  ? Iodinated Contrast Media Rash  ?   Red rash after cardiac cath 1 wk ago, ? Contrast allergy, requires 13 hr prep now per dr.gallerani//a.calhoun ?   ? ? ?Patient Measurements: ?Height: '5\' 2"'$  (157.5 cm) ?Weight: 77.8 kg (171 lb 8.3 oz) ?IBW/kg (Calculated) : 50.1 ?Heparin Dosing Weight: 66.8kg ? ?Vital Signs: ?Temp: 97.7 ?F (36.5 ?C) (04/21 1761) ?Temp Source: Axillary (04/21 0610) ?BP: 121/66 (04/21 0610) ?Pulse Rate: 67 (04/21 0610) ? ?Labs: ?Recent Labs  ?  09/09/21 ?0329 09/09/21 ?0712 09/10/21 ?0412 09/10/21 ?1351 09/11/21 ?0656  ?HGB 8.3*  --  8.1*  --  7.4*  ?HCT 26.3*  --  26.8*  --  23.4*  ?PLT 147*  --  176  --  134*  ?LABPROT 19.0*  --  19.4*  --  20.8*  ?INR 1.6*  --  1.7*  --  1.8*  ?HEPARINUNFRC 0.19*  --  <0.10* 0.35 <0.10*  ?CREATININE  --  2.05* 1.80*  --  1.76*  ? ? ? ?Estimated Creatinine Clearance: 26.3 mL/min (A) (by C-G formula based on SCr of 1.76 mg/dL (H)). ? ? ?Medical History: ?Past Medical History:  ?Diagnosis Date  ? Anemia   ? Anxiety   ? Aortic stenosis   ? a. Now has St Jude mechanical aortic valve (6/00). b. Echo (6/15) with EF 60-65%, mechanical aortic valve with mean gradient 27 mmHg.  ? Arthritis   ? a. Possible c-spine arthritis with pain down left arm.    ? Carotid artery disease ([place])   ? a. Carotid dopplers (7/16) with 40-59% BICA stenosis.    ? Chronic angle-closure glaucoma(365.23)   ? Chronic diastolic CHF (congestive heart failure) ([place])   ? a.  RHC (7/15) with mean RA 2, PA 22/11, mean PCWP 9, CI 3.79.  ? Contrast media allergy   ? Coronary atherosclerosis of native coronary artery   ? a. CABG at time of AVR in 6/00 with  RIMA-RCA. b. Abnl nuc 11/2013 -> LHC (7/15) with patent RIMA-RCA, 80% mRCA, 80% pLAD with FFR 0.73, treated with DES to pLAD.  ? Depression   ? Essential hypertension   ? GERD (gastroesophageal reflux disease)   ? Hyperlipidemia   ? Low back pain   ? Neuromuscular disorder ([place])   ? neuropathy  ? Peripheral vascular disease ([place])   ? a, H/o Right SFA stent. b. Peripheral arterial dopplers (7/16) with right SFA stent patent.    ? PONV (postoperative nausea and vomiting)   ? N&V  ? Type II diabetes mellitus ([place])   ? ? ?Assessment: ?71 YOF presenting with mechanical fall and femur fx, hx of mechanical AVR on warfarin PTA. Warfarin held for heparin drip with possible need for femur surgery, resumed 4/20 am. ? ?INR today is 1.8, CBC stable, heparin level <0.1 due to IV being pulled. Discussed with Dr. Loleta Books - will transition to SQ enoxaparin with need for bridging prior to D/C. CrCl ~26 with AdBW. ? ?Warfarin PTA regimen: 1.5 mg Tues/Thurs, 3 mg all other days ? ?Goal of Therapy:  ?Heparin level 0.3-0.7 units/ml ?Monitor platelets  by anticoagulation protocol: Yes ?  ?Plan:  ?-Enoxaparin '80mg'$  SQ q24h (~'1mg'$ /kg) ?-Warfarin 4.'5mg'$  PO x1 tonight to boost ?-If discharged - recommended boosted dose as above, then resume home regimen with INR check ~Tuesday ? ? ? ?Arrie Senate, PharmD, BCPS, BCCP ?Clinical Pharmacist ?[phone] ?Please check AMION for all [place] numbers ?09/11/2021 ? ? ?

## 2021-09-11 NOTE — TOC Initial Note (Signed)
Transition of Care (TOC) - Initial/Assessment Note  ? ? ?Patient Details  ?Name: Abigail Oliver ?MRN: [id number] ?Date of Birth: 12-25-1945 ? ?Transition of Care (TOC) CM/SW Contact:    ?Graves-Bigelow, Ocie Cornfield, RN ?Phone Number: ?09/11/2021, 11:32 AM ? ?Clinical Narrative: Risk for readmission assessment completed. PTA patient was from home with the support of spouse. Patient wants to return home with home health services. Medicare.gov list provided to the patient and the patient really had no preference. Case Manager discussed [facility] and the patient is agreeable to the agency. Referral submitted to [facility] and they can service the patient. Start of care to begin within 24-48 hours post transition home for PT/OT/RN if needs INR lab draws. Case Manager reached out to the provider to check if the patient needs RN/lab draws. Awaiting confirmation from the provider. Case Manager called [employer] for DME wheelchair- secure chat submitted to the MD for orders. Wheelchair to be delivered to the room prior to transition home. Patient states she will have transportation home. Case Manager will continue to follow.         ? ? ?Expected Discharge Plan: [place] ?Barriers to Discharge: Continued Medical Work up ? ? ?Patient Goals and CMS Choice ?Patient states their goals for this hospitalization and ongoing recovery are:: to return home ?CMS Medicare.gov Compare Post Acute Care list provided to:: Patient ?Choice offered to / list presented to : Patient ? ?Expected Discharge Plan and Services ?Expected Discharge Plan: [place] ?  ?Discharge Planning Services: CM Consult ?Post Acute Care Choice: Home Health, Durable Medical Equipment ?Living arrangements for the past 2 months: [place] ?                ?DME Arranged: Lightweight manual wheelchair with seat cushion ?DME Agency: [employer] ?Date DME Agency Contacted: 09/11/21 ?Time DME Agency Contacted: [phone] ?Representative  spoke with at DME Agency: Annie Sable ?HH Arranged: RN, PT, OT ?[place] Agency: [facility] [place]) ?Date HH Agency Contacted: 09/11/21 ?Time [place]: 1100 ?Representative spoke with at [place]: Levada Dy ? ?Prior Living Arrangements/Services ?Living arrangements for the past 2 months: [place] ?Lives with:: Spouse ?Patient language and need for interpreter reviewed:: Yes ?Do you feel safe going back to the place where you live?: Yes      ?Need for Family Participation in Patient Care: Yes (Comment) ?Care giver support system in place?: Yes (comment) ?Current home services: DME (has a rolling walker) ?Criminal Activity/Legal Involvement Pertinent to Current Situation/Hospitalization: No - Comment as needed ? ?Activities of Daily Living ?Home Assistive Devices/Equipment: None ?ADL Screening (condition at time of admission) ?Patient's cognitive ability adequate to safely complete daily activities?: No ?Is the patient deaf or have difficulty hearing?: No ?Does the patient have difficulty seeing, even when wearing glasses/contacts?: No ?Does the patient have difficulty concentrating, remembering, or making decisions?: No ?Patient able to express need for assistance with ADLs?: Yes ?Does the patient have difficulty dressing or bathing?: No ?Independently performs ADLs?: Yes (appropriate for developmental age) ?Does the patient have difficulty walking or climbing stairs?: No ?Weakness of Legs: None ?Weakness of Arms/Hands: None ? ?Permission Sought/Granted ?Permission sought to share information with : Facility Sport and exercise psychologist, Tourist information centre manager, Family Supports ?Permission granted to share information with : Yes, Verbal Permission Granted ?   ? Permission granted to share info w AGENCY: [facility], [employer] ?   ?   ? ?Emotional Assessment ?Appearance:: Appears stated age ?Attitude/Demeanor/Rapport: Engaged ?Affect (typically observed):  Appropriate ?Orientation: : Oriented to Place, Oriented to  Situation, Oriented to Self, Oriented to  Time ?Alcohol / Substance Use: Not Applicable ?Psych Involvement: No (comment) ? ?Admission diagnosis:  Chronic anticoagulation [Z79.01] ?Closed bicondylar fracture of distal femur ([place]) [place], [place] ?Fall, initial encounter [W19.XXXA] ?Subtherapeutic anticoagulation [Z51.81, Z79.01] ?Closed fracture of left femur, unspecified fracture morphology, unspecified portion of femur, initial encounter ([place]) [S72.92XA] ?Patient Active Problem List  ? Diagnosis Date Noted  ? Iron deficiency anemia 09/10/2021  ? Anxiety 09/09/2021  ? AKI (acute kidney injury) ([place]) 09/09/2021  ? NSTEMI (non-ST elevated myocardial infarction) ([place]) 09/06/2021  ? Closed bicondylar fracture of distal femur ([place]) 09/04/2021  ? Essential hypertension 09/04/2021  ? Benign paroxysmal positional vertigo 07/14/2015  ? Dizziness and giddiness 07/14/2015  ? Diabetic polyneuropathy associated with type 2 diabetes mellitus ([place]) 07/14/2015  ? Spinal stenosis, lumbar region, without neurogenic claudication 07/14/2015  ? Cervical radiculopathy 07/14/2015  ? Chest pain 07/07/2015  ? Angina pectoris ([place]) 07/07/2015  ? Dizziness 05/14/2015  ? Left-sided weakness 02/23/2015  ? Acute sinusitis 01/29/2015  ? Cough 08/29/2014  ? Cerumen impaction 03/28/2014  ? S/P arterial stent-to LAD, Promus DES 12/14/13 12/15/2013  ? Pulmonary nodules/lesions, multiple 11/19/2013  ? Rotator cuff syndrome of left shoulder 05/28/2013  ? Chronic diastolic heart failure ([place]) 01/02/2013  ? Anemia, iron deficiency 11/05/2012  ? H/O aortic valve replacement 09/21/2011  ? Dyslipidemia 09/21/2011  ? CAD, CABG 2000, low risk Myoview April 2011; 2015 + myoview 09/21/2011  ? Diastolic dysfunction with NL LVF 2D August 2012 09/21/2011  ? Claudication ([place]) 09/21/2011  ? PVD, Rt SFA PTA/Stent 09/20/11 09/21/2011  ? Nodule of neck 07/01/2011  ? MIGRAINE, OPHTHALMIC 12/16/2009  ? Depression with anxiety 12/15/2009  ? OSTEOARTHRITIS,  GENERALIZED, MULTIPLE JOINTS 12/30/2008  ? Type II diabetes mellitus with peripheral autonomic neuropathy ([place]) 07/01/2008  ? Chronic angle-closure glaucoma 07/01/2008  ? DISPLCMT LUMBAR INTERVERT DISC W/O MYELOPATHY 07/01/2008  ? Calcaneal spur 07/01/2008  ? OSTEOPOROSIS 07/01/2008  ? ?PCP:  [name], PA-C ?Pharmacy:   ?[facility] [id number] - [place], [address] ?[address] ?[place] [address] ?Phone: [phone] Fax: [phone] ? ? ?Readmission Risk Interventions ? ?  09/11/2021  ? 11:29 AM  ?Readmission Risk Prevention Plan  ?Transportation Screening Complete  ?PCP or Specialist Appt within 3-5 Days Complete  ?[place] or Home Care Consult Complete  ?Social Work Consult for [place] Planning/Counseling Complete  ?Palliative Care Screening Not Applicable  ?Medication Review Press photographer) Complete  ? ? ? ?

## 2021-09-12 DIAGNOSIS — N179 Acute kidney failure, unspecified: Secondary | ICD-10-CM | POA: Diagnosis not present

## 2021-09-12 DIAGNOSIS — F419 Anxiety disorder, unspecified: Secondary | ICD-10-CM | POA: Diagnosis not present

## 2021-09-12 DIAGNOSIS — S72422A Displaced fracture of lateral condyle of left femur, initial encounter for closed fracture: Secondary | ICD-10-CM | POA: Diagnosis not present

## 2021-09-12 DIAGNOSIS — Z7901 Long term (current) use of anticoagulants: Secondary | ICD-10-CM

## 2021-09-12 DIAGNOSIS — D509 Iron deficiency anemia, unspecified: Secondary | ICD-10-CM

## 2021-09-12 DIAGNOSIS — I2510 Atherosclerotic heart disease of native coronary artery without angina pectoris: Secondary | ICD-10-CM | POA: Diagnosis not present

## 2021-09-12 DIAGNOSIS — Z5181 Encounter for therapeutic drug level monitoring: Secondary | ICD-10-CM | POA: Diagnosis not present

## 2021-09-12 DIAGNOSIS — R410 Disorientation, unspecified: Secondary | ICD-10-CM

## 2021-09-12 LAB — BASIC METABOLIC PANEL
Anion gap: 9 (ref 5–15)
BUN: 33 mg/dL — ABNORMAL HIGH (ref 8–23)
CO2: 24 mmol/L (ref 22–32)
Calcium: 8.7 mg/dL — ABNORMAL LOW (ref 8.9–10.3)
Chloride: 108 mmol/L (ref 98–111)
Creatinine, Ser: 1.65 mg/dL — ABNORMAL HIGH (ref 0.44–1.00)
GFR, Estimated: 32 mL/min — ABNORMAL LOW (ref >60)
Glucose, Bld: 128 mg/dL — ABNORMAL HIGH (ref 70–99)
Potassium: 3.8 mmol/L (ref 3.5–5.1)
Sodium: 141 mmol/L (ref 135–145)

## 2021-09-12 LAB — CBC
HCT: 26.7 % — ABNORMAL LOW (ref 36.0–46.0)
Hemoglobin: 8.1 g/dL — ABNORMAL LOW (ref 12.0–15.0)
MCH: 27.1 pg (ref 26.0–34.0)
MCHC: 30.3 g/dL (ref 30.0–36.0)
MCV: 89.3 fL (ref 80.0–100.0)
Platelets: 164 10*3/uL (ref 150–400)
RBC: 2.99 MIL/uL — ABNORMAL LOW (ref 3.87–5.11)
RDW: 17.7 % — ABNORMAL HIGH (ref 11.5–15.5)
WBC: 6.7 10*3/uL (ref 4.0–10.5)
nRBC: 0.5 % — ABNORMAL HIGH (ref 0.0–0.2)

## 2021-09-12 LAB — PROTIME-INR
INR: 2 — ABNORMAL HIGH (ref 0.8–1.2)
Prothrombin Time: 22.1 seconds — ABNORMAL HIGH (ref 11.4–15.2)

## 2021-09-12 LAB — GLUCOSE, CAPILLARY: Glucose-Capillary: 132 mg/dL — ABNORMAL HIGH (ref 70–99)

## 2021-09-12 MED ORDER — WARFARIN SODIUM 2 MG PO TABS: 3.0000 mg | ORAL_TABLET | Freq: Once | ORAL | Status: DC

## 2021-09-12 MED ORDER — METOPROLOL SUCCINATE ER 25 MG PO TB24: 75.0000 mg | ORAL_TABLET | Freq: Every day | ORAL | 3 refills | Status: DC

## 2021-09-12 MED ORDER — WARFARIN SODIUM 2.5 MG PO TABS: 4.5000 mg | ORAL_TABLET | Freq: Once | ORAL | Status: DC

## 2021-09-12 MED ORDER — WARFARIN SODIUM 3 MG PO TABS: 3.0000 mg | ORAL_TABLET | Freq: Every day | ORAL | 0 refills | Status: DC

## 2021-09-12 MED ORDER — OXYCODONE HCL 5 MG PO TABS: 5.0000 mg | ORAL_TABLET | ORAL | 0 refills | Status: AC | PRN

## 2021-09-12 MED ORDER — ENOXAPARIN SODIUM 80 MG/0.8ML IJ SOSY: 80.0000 mg | PREFILLED_SYRINGE | Freq: Two times a day (BID) | INTRAMUSCULAR | 0 refills | Status: DC

## 2021-09-12 NOTE — Discharge Summary (Signed)
?Physician Discharge Summary ?  ?Patient: Abigail Oliver MRN: [id number] DOB: 07/01/1945  ?Admit date:     09/04/2021  ?Discharge date: 09/12/21  ?Discharge Physician: Edwin Dada  ? ?PCP: [name], PA-C  ? ? ? ?Recommendations at discharge:  ?[facility] RN to obtain INR early next week and fax to [facility] anticoagulation clinic --> adjust warfarin dosing and stop Lovenox when appropriate ?Follow up with Dr. Harvie Heck in 1-2 weeks ?Dr. Harvie Heck: Please check Cr and Hgb in 1 week ?Dr. Harvie Heck: Please check iron studies in 8 weeks ?Follow up with Orthopedics for post-op care in 2 weeks ? ? ? ? ? ? ?Discharge Diagnoses: ?Principal Problem: ?  Closed bicondylar fracture of distal femur ([place]) ?Active Problems: ?  NSTEMI (non-ST elevated myocardial infarction) ([place]) ?  AKI (acute kidney injury) ([place]) ?  Type II diabetes mellitus with peripheral autonomic neuropathy (HCC) ?  Chronic angle-closure glaucoma ?  Subtherapeutic anticoagulation ?  H/O aortic valve replacement ?  Dyslipidemia ?  CAD, CABG 2000, low risk Myoview April 2011; 2015 + myoview ?  Chronic diastolic heart failure ([place]) ?  Essential hypertension ?  Anxiety ?  Iron deficiency anemia ?  Delirium ? ?  ? ? ?Hospital Course: ?Abigail Oliver is a 76 yo. F with CAD s/p CABG, PCI last 2015, hx AVR on warfarin, DCHF, HTN, HLD, and DM who presented with fall, found to have  left intercondylar fracture of femur with intra-articular extension.  Admitted for surgical intervention. ? ? ?While in the hospital, developed chest discomfort, elevated troponins.  Subsequent cardiac cath showed no new thrombus, Cardiology recommended medical management and to proceed to surgery.   ? ?Post-op course complicated by mild delirium worsened with Ambien. ? ?On day of discharge, mentating well, pain well controlled, appetite normal and vital signs normal.  Had close follow up for INR and Cr check. ? ? ? ? ?Assessment and Plan: ?* Closed bicondylar fracture of distal femur  ([place]) ?S/p ORIF LEFT intra-articular distal femur fracture by Dr. Doreatha Martin on 4/19 ?- May be touch down weight bearing to left ?- Unrestricted ROM to left ?- Follow up with Dr. Doreatha Martin in 2 weeks ?- On warfarin chronically ? ? ? ?Type 2 NSTEMI/[place] ischemia ?Patient developed chest pressure while in the hospital, found to have troponin elevation and new ST depressions.   ? ?Subsequent LHC showed no thrombus, no stenting needed ? ? ?Acute kidney injury on CKD stage IIIb ?Baseline Cr appears to be around 1.3-1.6; here, creatinine up to 2 post-op.   ? ?With oral fluids and holding diuretics, patient's Cr improved to 1.6.    ? ?Iron deficiency anemia ?Baseline Hgb 11, here down to 8s on admission.  This remained stable post-op, but iron studies showed markedly low iron saturation.   ? ?Given IV iron.  Recommend PCP follow up ? ? ?H/O aortic valve replacement ?Resumed warfarin post-op.  Discharged with Lovenox bridge. ? ?INR recheck with [facility] on Monday and fax to her home anticoagulation clinic.  Given 10 days supply of Lovenox. ? ?Defer duration of overlap after reaching INR goal 2.5 to Cardiology. ? ? ? ? ? ? ? ? ? ? ?  ? ? ? ? ? ?[facility] Controlled Substances Registry was reviewed for this patient prior to discharge. ? ?Consultants: Cardiology, orthopedics ?Procedures performed: ORIF left knee, left heart cath  ?Disposition: Home with Home health ?Diet recommendation:  ?Discharge Diet Orders (From admission, onward)  ? ?  Start  Ordered  ? 09/12/21 0000  Diet - low sodium heart healthy       ? 09/12/21 1111  ? ?  ?  ? ?  ? ? ? ?DISCHARGE MEDICATION: ?Allergies as of 09/12/2021   ? ?   Reactions  ? Atorvastatin   ? Muscle pain  ? Dapagliflozin   ? Other reaction(s): nausea  ? Gabapentin   ? Other reaction(s): stomach issues  ? Simvastatin Other (See Comments)  ? Muscle pain  ? Sulfa Antibiotics   ? unknown  ? Iodinated Contrast Media Rash  ?  Red rash after cardiac cath 1 wk ago, ? Contrast allergy, requires  13 hr prep now per dr.gallerani//a.calhoun ?   ? ?  ? ?  ?Medication List  ?  ? ?STOP taking these medications   ? ?clonazePAM 0.5 MG tablet ?Commonly known as: KLONOPIN ?  ?dicyclomine 10 MG capsule ?Commonly known as: BENTYL ?  ? ?  ? ?TAKE these medications   ? ?aspirin EC 81 MG tablet ?Take 81 mg by mouth every evening. ?  ?Biofreeze 4 % Gel ?Generic drug: Menthol (Topical Analgesic) ?Apply 1 application. topically daily as needed (pain). ?  ?busPIRone 15 MG tablet ?Commonly known as: BUSPAR ?TAKE ONE TABLET BY MOUTH THREE TIMES A DAY ?  ?enoxaparin 80 MG/0.8ML injection ?Commonly known as: LOVENOX ?Inject 0.8 mLs (80 mg total) into the skin 2 (two) times daily. ?  ?ferrous sulfate 325 (65 FE) MG tablet ?Take 1 tablet (325 mg total) by mouth daily. ?  ?folic acid 1 MG tablet ?Commonly known as: FOLVITE ?Take 1 mg by mouth daily. ?  ?HYDROcodone-acetaminophen 5-325 MG tablet ?Commonly known as: NORCO/VICODIN ?Take 1 tablet by mouth every 6 (six) hours as needed for moderate pain. ?  ?latanoprost 0.005 % ophthalmic solution ?Commonly known as: XALATAN ?PLACE 1 DROP INTO BOTH EYES AT BEDTIME. ?  ?metFORMIN 1000 MG tablet ?Commonly known as: GLUCOPHAGE ?Take 1,000 mg by mouth daily with breakfast. ?  ?metoprolol succinate 25 MG 24 hr tablet ?Commonly known as: TOPROL-XL ?Take 3 tablets (75 mg total) by mouth daily. Take with or immediately following a meal. ?Start taking on: September 13, 2021 ?What changed:  ?medication strength ?how much to take ?additional instructions ?  ?oxyCODONE 5 MG immediate release tablet ?Commonly known as: Oxy IR/ROXICODONE ?Take 1 tablet (5 mg total) by mouth every 4 (four) hours as needed for up to 5 days for severe pain or moderate pain. ?  ?pantoprazole 40 MG tablet ?Commonly known as: PROTONIX ?TAKE ONE TABLET BY MOUTH DAILY ?What changed:  ?how to take this ?when to take this ?reasons to take this ?  ?potassium chloride SA 20 MEQ tablet ?Commonly known as: KLOR-CON M ?Take 1 tablet (20  mEq total) by mouth 3 (three) times daily. ?What changed:  ?how much to take ?when to take this ?  ?rosuvastatin 10 MG tablet ?Commonly known as: CRESTOR ?Take 10 mg by mouth every evening. ?What changed: Another medication with the same name was removed. Continue taking this medication, and follow the directions you see here. ?  ?SALONPAS GEL-PATCH HOT EX ?Apply 1 patch topically daily as needed (pain). ?  ?sertraline 25 MG tablet ?Commonly known as: ZOLOFT ?TAKE ONE TABLET BY MOUTH DAILY ?What changed: when to take this ?  ?torsemide 20 MG tablet ?Commonly known as: DEMADEX ?Take 2 tablets (40 mg total) by mouth daily. ?What changed: when to take this ?  ?traZODone 50 MG tablet ?Commonly known as:  DESYREL ?TAKE 1/2 TO 1 TABLET BY MOUTH EVERY NIGHT AT BEDTIME AS NEEDED FOR SLEEP ?What changed: See the new instructions. ?  ?Vitamin D 125 MCG (5000 UT) Caps ?Take 5,000 Units by mouth daily. ?  ?warfarin 3 MG tablet ?Commonly known as: COUMADIN ?Take as directed. If you are unsure how to take this medication, talk to your nurse or doctor. ?Original instructions: Take 1 tablet (3 mg total) by mouth daily at 4 PM. ?What changed:  ?how much to take ?how to take this ?when to take this ?additional instructions ?  ? ?  ? ?  ?  ? ? ?  ?Discharge Care Instructions  ?(From admission, onward)  ?  ? ? ?  ? ?  Start     Ordered  ? 09/12/21 0000  Discharge wound care:       ?Comments: As instructed by orthopedics Dr. Doreatha Martin  ? 09/12/21 1111  ? ?  ?  ? ?  ? ? Follow-up Information   ? ? [place] Follow up.   ?Specialty: Home Health Services ?Why: Known as [facility], Occupational Therapy-office to call with visit times. ?Contact information: ?[address] ?[address] ?[place] [address] ?[phone] ? ? ?  ?  ? ? [facility] Follow up.   ?Why: [facility]ir to be delivered to the room prior to transition home. ?Contact information: ?[address] ?[place]  [address] ?[phone] ? ? ?  ?  ? ? Haddix, Thomasene Lot, MD. Schedule an appointment as soon as possible for a visit in 2 week(s).   ?Specialty: Orthopedic Surgery ?Contact information: ?[address] [place] [address]

## 2021-09-12 NOTE — Progress Notes (Addendum)
ANTICOAGULATION CONSULT NOTE - Follow Up Consult ? ?Pharmacy Consult for Warfarin ?Indication:  mAVR ? ?Allergies  ?Allergen Reactions  ? Atorvastatin   ?  Muscle pain  ? Dapagliflozin   ?  Other reaction(s): nausea  ? Gabapentin   ?  Other reaction(s): stomach issues  ? Simvastatin Other (See Comments)  ?  Muscle pain  ? Sulfa Antibiotics   ?  unknown  ? Iodinated Contrast Media Rash  ?   Red rash after cardiac cath 1 wk ago, ? Contrast allergy, requires 13 hr prep now per dr.gallerani//a.calhoun ?   ? ? ?Patient Measurements: ?Height: '5\' 2"'$  (157.5 cm) ?Weight: 77.8 kg (171 lb 8.3 oz) ?IBW/kg (Calculated) : 50.1 ? ?Vital Signs: ?Temp: 97.9 ?F (36.6 ?C) (04/22 0809) ?Temp Source: Oral (04/22 0809) ?BP: 150/43 (04/22 0809) ?Pulse Rate: 75 (04/22 0809) ? ?Labs: ?Recent Labs  ?  09/10/21 ?0412 09/10/21 ?1351 09/11/21 ?0656 09/12/21 ?0318  ?HGB 8.1*  --  7.4* 8.1*  ?HCT 26.8*  --  23.4* 26.7*  ?PLT 176  --  134* 164  ?LABPROT 19.4*  --  20.8* 22.1*  ?INR 1.7*  --  1.8* 2.0*  ?HEPARINUNFRC <0.10* 0.35 <0.10*  --   ?CREATININE 1.80*  --  1.76* 1.65*  ? ? ?Estimated Creatinine Clearance: 28 mL/min (A) (by C-G formula based on SCr of 1.65 mg/dL (H)). ? ? ?Medications:  ?Scheduled:  ? acetaminophen  650 mg Oral Q6H  ? aspirin EC  81 mg Oral Daily  ? docusate sodium  100 mg Oral BID  ? enoxaparin (LOVENOX) injection  80 mg Subcutaneous Q24H  ? feeding supplement (GLUCERNA SHAKE)  237 mL Oral TID BM  ? insulin aspart  0-15 Units Subcutaneous TID WC  ? insulin aspart  0-5 Units Subcutaneous QHS  ? latanoprost  1 drop Both Eyes QHS  ? metoprolol succinate  75 mg Oral Daily  ? multivitamin with minerals  1 tablet Oral Daily  ? mupirocin ointment  1 application. Nasal BID  ? pantoprazole  40 mg Oral Daily  ? rosuvastatin  10 mg Oral QPM  ? sertraline  25 mg Oral Daily  ? sodium chloride flush  3 mL Intravenous Q12H  ? sodium chloride flush  3 mL Intravenous Q12H  ? torsemide  40 mg Oral Daily  ? Warfarin - Pharmacist Dosing  Inpatient   Does not apply [id number]  ? ? ?Assessment: ?76 yo F presenting with mechanical fall and femur fx, hx of mechanical AVR on warfarin PTA. Warfarin held for heparin drip with possible need for femur surgery, resumed 4/20 am. Discussed with Dr. Loleta Books on 4/21 - transitioned from IV heparin to SQ enoxaparin with need for bridging prior to discharge.  ?  ?INR today is subtherapeutic at 2.0 (INR goal 2.5-3.5), CBC stable. CrCl ~28 mL/min.  ?  ?Warfarin PTA regimen: 1.5 mg Tues/Thurs, 3 mg all other days. ? ?Goal of Therapy:  ?INR 2.5-3.5 ?Monitor platelets by anticoagulation protocol: Yes ?  ?Plan:  ?Enoxaparin 80 mg SQ q24h (~1 mg/kg) ?Warfarin 3 mg PO x 1 tonight  ?If discharged - recommend to resume home regimen with INR check ~Tuesday ? ? ?Vance Peper, PharmD ?PGY1 Pharmacy Resident ?Phone [phone] ?09/12/2021 8:28 AM  ? ?Please check AMION for all [place] phone numbers ?After 10:00 PM, call [place] [phone] ?

## 2021-09-12 NOTE — Progress Notes (Signed)
? ?Progress Note ? ?Patient Name: Abigail Oliver ?Date of Encounter: 09/12/2021 ? ?Primary Cardiologist: Quay Burow, MD  ? ?Subjective  ? ?Stable after knee surgery. No chest pain or sob.  ? ?Inpatient Medications  ?  ?Scheduled Meds: ? acetaminophen  650 mg Oral Q6H  ? aspirin EC  81 mg Oral Daily  ? docusate sodium  100 mg Oral BID  ? enoxaparin (LOVENOX) injection  80 mg Subcutaneous Q24H  ? feeding supplement (GLUCERNA SHAKE)  237 mL Oral TID BM  ? insulin aspart  0-15 Units Subcutaneous TID WC  ? insulin aspart  0-5 Units Subcutaneous QHS  ? latanoprost  1 drop Both Eyes QHS  ? metoprolol succinate  75 mg Oral Daily  ? multivitamin with minerals  1 tablet Oral Daily  ? mupirocin ointment  1 application. Nasal BID  ? pantoprazole  40 mg Oral Daily  ? rosuvastatin  10 mg Oral QPM  ? sertraline  25 mg Oral Daily  ? sodium chloride flush  3 mL Intravenous Q12H  ? sodium chloride flush  3 mL Intravenous Q12H  ? torsemide  40 mg Oral Daily  ? Warfarin - Pharmacist Dosing Inpatient   Does not apply [id number]  ? ?Continuous Infusions: ? sodium chloride    ? ?PRN Meds: ?sodium chloride, alum & mag hydroxide-simeth, bisacodyl, metoCLOPramide **OR** metoCLOPramide (REGLAN) injection, ondansetron **OR** ondansetron (ZOFRAN) IV, oxyCODONE, polyethylene glycol, sodium chloride flush  ? ?Vital Signs  ?  ?Vitals:  ? 09/11/21 1356 09/11/21 2043 09/12/21 0400 09/12/21 0809  ?BP:  (!) 115/44 113/61 (!) 150/43  ?Pulse:  73  75  ?Resp:  '17 16 16  '$ ?Temp: 99.1 ?F (37.3 ?C) 98.1 ?F (36.7 ?C) 98 ?F (36.7 ?C) 97.9 ?F (36.6 ?C)  ?TempSrc: Oral Oral Oral Oral  ?SpO2:  94% 94% 97%  ?Weight:      ?Height:      ? ? ?Intake/Output Summary (Last 24 hours) at 09/12/2021 0949 ?Last data filed at 09/12/2021 0500 ?Gross per 24 hour  ?Intake 360 ml  ?Output 2300 ml  ?Net -1940 ml  ? ?Filed Weights  ? 09/04/21 1115 09/06/21 0450  ?Weight: 76.7 kg 77.8 kg  ? ? ?Telemetry  ?  ?nsr - Personally Reviewed ? ?ECG  ?  ?none - Personally  Reviewed ? ?Physical Exam  ? ?GEN: No acute distress.   ?Neck: No JVD ?Cardiac: RRR, no murmurs, rubs, or gallops. Mech S2. ?Respiratory: Clear to auscultation bilaterally. ?GI: Soft, nontender, non-distended  ?MS: No edema; No deformity. ?Neuro:  Nonfocal  ?Psych: Normal affect  ? ?Labs  ?  ?Chemistry ?Recent Labs  ?Lab 09/10/21 ?0412 09/11/21 ?5397 09/12/21 ?0318  ?NA 135 137 141  ?K 4.2 3.9 3.8  ?CL 105 107 108  ?CO2 19* 22 24  ?GLUCOSE 160* 104* 128*  ?BUN 40* 38* 33*  ?CREATININE 1.80* 1.76* 1.65*  ?CALCIUM 8.8* 8.6* 8.7*  ?GFRNONAA 29* 30* 32*  ?ANIONGAP '11 8 9  '$ ?  ? ?Hematology ?Recent Labs  ?Lab 09/10/21 ?0412 09/11/21 ?6734 09/12/21 ?0318  ?WBC 9.0 6.2 6.7  ?RBC 2.98* 2.66* 2.99*  ?HGB 8.1* 7.4* 8.1*  ?HCT 26.8* 23.4* 26.7*  ?MCV 89.9 88.0 89.3  ?MCH 27.2 27.8 27.1  ?MCHC 30.2 31.6 30.3  ?RDW 17.6* 17.7* 17.7*  ?PLT 176 134* 164  ? ? ?Cardiac EnzymesNo results for input(s): TROPONINI in the last 168 hours. No results for input(s): TROPIPOC in the last 168 hours.  ? ?BNP ?Recent Labs  ?Lab 09/06/21 ?  7510  ?BNP 1,282.7*  ?  ? ?DDimer No results for input(s): DDIMER in the last 168 hours.  ? ?Radiology  ?  ?No results found. ? ?Cardiac Studies  ? ?See above ? ?Patient Profile  ?   ?76 y.o. female admitted for knee replacement in the setting of mechanical AVR ? ?Assessment & Plan  ?  ?Mechanical AVR - INR is 2 today. She can be discharged with 2 days of lovenox and followup INR in 3-4 days. Continue warfarin as well. ?PVC's - much improved. No change in meds. ?[facility] will sign off.   ?Medication Recommendations:  see above ?Other recommendations (labs, testing, etc):  INR in 3-4 days ?Follow up as an outpatient:  Dr. Judithann Sauger in 3-4 weeks. ? ?For questions or updates, please contact [place] ?Please consult [email or web address] for contact info under Cardiology/STEMI. ?  ?   ?Signed, ?Cristopher Peru, MD  ?09/12/2021, 9:49 AM    ?

## 2021-09-14 ENCOUNTER — Telehealth: Payer: Self-pay | Admitting: Medical

## 2021-09-14 ENCOUNTER — Telehealth: Payer: Self-pay

## 2021-09-14 NOTE — Telephone Encounter (Signed)
[facility] is requesting new orders for an inr check tomorrow as pt refused service today.  ?

## 2021-09-14 NOTE — Telephone Encounter (Signed)
Caller/Agency: [facility] ?Callback Number: [phone] ?Requesting OT/PT/Skilled Nursing/Social Work/Speech Therapy: PT ?Frequency: Delay in start of care until tmrw ?

## 2021-09-14 NOTE — Telephone Encounter (Signed)
Transition Care Management Follow-up Telephone Call ?Date of discharge and from where: [place] 09-12-21 Dx: closed bicondylar Fx of distal femur ?How have you been since you were released from the hospital? Doing better  ?Any questions or concerns? No ? ?Items Reviewed: ?Did the pt receive and understand the discharge instructions provided? Yes  ?Medications obtained and verified? Yes  ?Other? No  ?Any new allergies since your discharge? No  ?Dietary orders reviewed? Yes ?Do you have support at home? Yes  ? ?Home Care and Equipment/Supplies: ?Were home health services ordered? Yes PT/OT ?If so, what is the name of the agency? [facility]   ?Has the agency set up a time to come to the patient's home? no ?Were any new equipment or medical supplies ordered?  Yes: wheelchair ?What is the name of the medical supply agency?hospital  ?Were you able to get the supplies/equipment? yes ?Do you have any questions related to the use of the equipment or supplies? No ? ?Functional Questionnaire: (I = Independent and D = Dependent) ?ADLs: D ? ?Bathing/Dressing- D ? ?Meal Prep- D ? ?Eating- I ? ?Maintaining continence- I ? ?Transferring/Ambulation- D-wheelchair ? ?Managing Meds- D ? ?Follow up appointments reviewed: ? ?PCP Hospital f/u appt confirmed? Yes  Scheduled to see Dr Osborn Coho on 09-17-21 @ 11am. ?[facility] f/u appt confirmed? No- pt is waiting on call from surgeon to schedule fu with Dr Verlon Setting ?Are transportation arrangements needed? No  ?If their condition worsens, is the pt aware to call PCP or go to the Emergency Dept.? Yes ?Was the patient provided with contact information for the PCP's office or ED? Yes ?Was to pt encouraged to call back with questions or concerns? Yes  ?

## 2021-09-15 ENCOUNTER — Other Ambulatory Visit: Payer: Self-pay | Admitting: Medical

## 2021-09-15 ENCOUNTER — Ambulatory Visit (INDEPENDENT_AMBULATORY_CARE_PROVIDER_SITE_OTHER): Payer: Medicare Other | Admitting: Pharmacist

## 2021-09-15 DIAGNOSIS — E1151 Type 2 diabetes mellitus with diabetic peripheral angiopathy without gangrene: Secondary | ICD-10-CM | POA: Diagnosis not present

## 2021-09-15 DIAGNOSIS — Z9181 History of falling: Secondary | ICD-10-CM | POA: Diagnosis not present

## 2021-09-15 DIAGNOSIS — Z952 Presence of prosthetic heart valve: Secondary | ICD-10-CM

## 2021-09-15 DIAGNOSIS — M5412 Radiculopathy, cervical region: Secondary | ICD-10-CM | POA: Diagnosis not present

## 2021-09-15 DIAGNOSIS — I5032 Chronic diastolic (congestive) heart failure: Secondary | ICD-10-CM | POA: Diagnosis not present

## 2021-09-15 DIAGNOSIS — N179 Acute kidney failure, unspecified: Secondary | ICD-10-CM | POA: Diagnosis not present

## 2021-09-15 DIAGNOSIS — I25119 Atherosclerotic heart disease of native coronary artery with unspecified angina pectoris: Secondary | ICD-10-CM | POA: Diagnosis not present

## 2021-09-15 DIAGNOSIS — F419 Anxiety disorder, unspecified: Secondary | ICD-10-CM | POA: Diagnosis not present

## 2021-09-15 DIAGNOSIS — Z951 Presence of aortocoronary bypass graft: Secondary | ICD-10-CM | POA: Diagnosis not present

## 2021-09-15 DIAGNOSIS — Z7982 Long term (current) use of aspirin: Secondary | ICD-10-CM | POA: Diagnosis not present

## 2021-09-15 DIAGNOSIS — Z7984 Long term (current) use of oral hypoglycemic drugs: Secondary | ICD-10-CM | POA: Diagnosis not present

## 2021-09-15 DIAGNOSIS — I080 Rheumatic disorders of both mitral and aortic valves: Secondary | ICD-10-CM | POA: Diagnosis not present

## 2021-09-15 DIAGNOSIS — E1142 Type 2 diabetes mellitus with diabetic polyneuropathy: Secondary | ICD-10-CM | POA: Diagnosis not present

## 2021-09-15 DIAGNOSIS — M48061 Spinal stenosis, lumbar region without neurogenic claudication: Secondary | ICD-10-CM | POA: Diagnosis not present

## 2021-09-15 DIAGNOSIS — Z5181 Encounter for therapeutic drug level monitoring: Secondary | ICD-10-CM | POA: Diagnosis not present

## 2021-09-15 DIAGNOSIS — I110 Hypertensive heart disease with heart failure: Secondary | ICD-10-CM | POA: Diagnosis not present

## 2021-09-15 DIAGNOSIS — I214 Non-ST elevation (NSTEMI) myocardial infarction: Secondary | ICD-10-CM | POA: Diagnosis not present

## 2021-09-15 DIAGNOSIS — E785 Hyperlipidemia, unspecified: Secondary | ICD-10-CM | POA: Diagnosis not present

## 2021-09-15 DIAGNOSIS — M80052D Age-related osteoporosis with current pathological fracture, left femur, subsequent encounter for fracture with routine healing: Secondary | ICD-10-CM | POA: Diagnosis not present

## 2021-09-15 DIAGNOSIS — F32A Depression, unspecified: Secondary | ICD-10-CM | POA: Diagnosis not present

## 2021-09-15 DIAGNOSIS — E1143 Type 2 diabetes mellitus with diabetic autonomic (poly)neuropathy: Secondary | ICD-10-CM | POA: Diagnosis not present

## 2021-09-15 DIAGNOSIS — Z7901 Long term (current) use of anticoagulants: Secondary | ICD-10-CM | POA: Diagnosis not present

## 2021-09-15 DIAGNOSIS — D509 Iron deficiency anemia, unspecified: Secondary | ICD-10-CM | POA: Diagnosis not present

## 2021-09-15 LAB — POCT INR
INR: 3.6 — AB (ref 2.0–3.0)
INR: 3.6 — AB (ref 2.0–3.0)

## 2021-09-15 NOTE — Telephone Encounter (Signed)
Spoke with agent ?

## 2021-09-15 NOTE — Patient Instructions (Signed)
Description   ?Change regimen to '3mg'$  daily except for 1.'5mg'$  on Tuesdays. Repeat INR in 1 week. [phone] ?  ? ? ?

## 2021-09-15 NOTE — Telephone Encounter (Signed)
VO given that it was okay to change days of inr check ?

## 2021-09-15 NOTE — Telephone Encounter (Signed)
HH called for additional VO  ? ?Pt 2x4 // 1x4  ?[place] please advise  ?

## 2021-09-16 NOTE — Telephone Encounter (Signed)
Okay to give VO ? °

## 2021-09-17 ENCOUNTER — Ambulatory Visit: Payer: Medicare Other | Admitting: Medical

## 2021-09-17 NOTE — Telephone Encounter (Signed)
Agent from [place] would like an update on VO to delay PT care. Please advise.  ? ?[phone] ?

## 2021-09-18 NOTE — Telephone Encounter (Signed)
VO given.

## 2021-09-21 ENCOUNTER — Telehealth: Payer: Self-pay | Admitting: Medical

## 2021-09-21 DIAGNOSIS — E1142 Type 2 diabetes mellitus with diabetic polyneuropathy: Secondary | ICD-10-CM | POA: Diagnosis not present

## 2021-09-21 DIAGNOSIS — I5032 Chronic diastolic (congestive) heart failure: Secondary | ICD-10-CM | POA: Diagnosis not present

## 2021-09-21 DIAGNOSIS — E1151 Type 2 diabetes mellitus with diabetic peripheral angiopathy without gangrene: Secondary | ICD-10-CM | POA: Diagnosis not present

## 2021-09-21 DIAGNOSIS — E1143 Type 2 diabetes mellitus with diabetic autonomic (poly)neuropathy: Secondary | ICD-10-CM | POA: Diagnosis not present

## 2021-09-21 DIAGNOSIS — M80052D Age-related osteoporosis with current pathological fracture, left femur, subsequent encounter for fracture with routine healing: Secondary | ICD-10-CM | POA: Diagnosis not present

## 2021-09-21 DIAGNOSIS — I110 Hypertensive heart disease with heart failure: Secondary | ICD-10-CM | POA: Diagnosis not present

## 2021-09-21 NOTE — Telephone Encounter (Signed)
FYI ? ?[name]) called to inform Abigail Oliver that the pt had a fall 4.26.23 onto her left hip and shoulder. No issues other than normal brusing. ?

## 2021-09-22 ENCOUNTER — Telehealth: Payer: Self-pay | Admitting: Medical

## 2021-09-22 ENCOUNTER — Ambulatory Visit (INDEPENDENT_AMBULATORY_CARE_PROVIDER_SITE_OTHER): Payer: Medicare Other | Admitting: Pharmacist

## 2021-09-22 DIAGNOSIS — I5032 Chronic diastolic (congestive) heart failure: Secondary | ICD-10-CM | POA: Diagnosis not present

## 2021-09-22 DIAGNOSIS — Z952 Presence of prosthetic heart valve: Secondary | ICD-10-CM

## 2021-09-22 DIAGNOSIS — M80052D Age-related osteoporosis with current pathological fracture, left femur, subsequent encounter for fracture with routine healing: Secondary | ICD-10-CM | POA: Diagnosis not present

## 2021-09-22 DIAGNOSIS — I110 Hypertensive heart disease with heart failure: Secondary | ICD-10-CM | POA: Diagnosis not present

## 2021-09-22 DIAGNOSIS — E1143 Type 2 diabetes mellitus with diabetic autonomic (poly)neuropathy: Secondary | ICD-10-CM | POA: Diagnosis not present

## 2021-09-22 DIAGNOSIS — E1151 Type 2 diabetes mellitus with diabetic peripheral angiopathy without gangrene: Secondary | ICD-10-CM | POA: Diagnosis not present

## 2021-09-22 DIAGNOSIS — E1142 Type 2 diabetes mellitus with diabetic polyneuropathy: Secondary | ICD-10-CM | POA: Diagnosis not present

## 2021-09-22 DIAGNOSIS — Z7901 Long term (current) use of anticoagulants: Secondary | ICD-10-CM

## 2021-09-22 LAB — POCT INR: INR: 3.7 — AB (ref 2.0–3.0)

## 2021-09-22 NOTE — Patient Instructions (Signed)
Description   ?Spoke with [employer].  Reduce regimen to '3mg'$  daily except for 1.'5mg'$  on Tuesday and Friday. Repeat INR in 1 week. [phone] ?  ? ? ?

## 2021-09-22 NOTE — Telephone Encounter (Signed)
HH would like to know if Abigail Oliver is wanting to refill lovenox given at the hospital. Also if there were any updates on a different type of pain medication, HH stated this has already been discusses with PCP. Please advise.  ?

## 2021-09-23 MED ORDER — HYDROCODONE-ACETAMINOPHEN 5-325 MG PO TABS: 1.0000 | ORAL_TABLET | Freq: Four times a day (QID) | ORAL | 0 refills | Status: DC | PRN

## 2021-09-23 MED ORDER — ZOLPIDEM TARTRATE 5 MG PO TABS: 5.0000 mg | ORAL_TABLET | Freq: Every evening | ORAL | 0 refills | Status: DC | PRN

## 2021-09-23 NOTE — Telephone Encounter (Signed)
Pt notified of medication and she stated she would call us back tomorrow to schedule after PT comes out to her house ?

## 2021-09-23 NOTE — Telephone Encounter (Signed)
Pt called back and stated she is unable to come due to nonweight bearing , PT comes out tomorrow to let her know when she is able to put weight on legs  ? ?Pt wants sleeping pill and pain medicine stated she hasnt been sleeping even with the melatonin  ? ?Stated her coumadin was out of wack as well ?

## 2021-09-23 NOTE — Telephone Encounter (Signed)
Left message on machine to call back  

## 2021-09-23 NOTE — Telephone Encounter (Signed)
Spoke with Ki[place] at with home health and she stated that the patient has fallen and is paranoid that she may fall again and it is hard for her to get out of the house.  Is there any way patient can maybe do virtual.  I have not spoken with patient yet. ?

## 2021-09-24 DIAGNOSIS — I5032 Chronic diastolic (congestive) heart failure: Secondary | ICD-10-CM | POA: Diagnosis not present

## 2021-09-24 DIAGNOSIS — I110 Hypertensive heart disease with heart failure: Secondary | ICD-10-CM | POA: Diagnosis not present

## 2021-09-24 DIAGNOSIS — E1143 Type 2 diabetes mellitus with diabetic autonomic (poly)neuropathy: Secondary | ICD-10-CM | POA: Diagnosis not present

## 2021-09-24 DIAGNOSIS — M80052D Age-related osteoporosis with current pathological fracture, left femur, subsequent encounter for fracture with routine healing: Secondary | ICD-10-CM | POA: Diagnosis not present

## 2021-09-24 DIAGNOSIS — E1151 Type 2 diabetes mellitus with diabetic peripheral angiopathy without gangrene: Secondary | ICD-10-CM | POA: Diagnosis not present

## 2021-09-24 DIAGNOSIS — E1142 Type 2 diabetes mellitus with diabetic polyneuropathy: Secondary | ICD-10-CM | POA: Diagnosis not present

## 2021-09-24 NOTE — Telephone Encounter (Signed)
Patient stated Ambien gives her nightmares and would like an alternative ?

## 2021-09-24 NOTE — Telephone Encounter (Signed)
Patient wants an alternative due to [place] giving her nightmares ?

## 2021-09-24 NOTE — Telephone Encounter (Signed)
Pt called stated PT is on the way over today to access her and her walking , also stated her sons will be putting a ramp in over the weekend so she can use her wheelchair to get in and out of the house , she will call back Monday to set up an appointment ?

## 2021-09-25 ENCOUNTER — Telehealth: Payer: Medicare Other | Admitting: Medical

## 2021-09-25 NOTE — Telephone Encounter (Signed)
[facility] visit scheduled for today 09/25/21 at 240 ?

## 2021-09-27 ENCOUNTER — Other Ambulatory Visit: Payer: Self-pay | Admitting: Medical

## 2021-09-29 ENCOUNTER — Ambulatory Visit (INDEPENDENT_AMBULATORY_CARE_PROVIDER_SITE_OTHER): Payer: Medicare Other | Admitting: Pharmacist

## 2021-09-29 DIAGNOSIS — M80052D Age-related osteoporosis with current pathological fracture, left femur, subsequent encounter for fracture with routine healing: Secondary | ICD-10-CM | POA: Diagnosis not present

## 2021-09-29 DIAGNOSIS — I110 Hypertensive heart disease with heart failure: Secondary | ICD-10-CM | POA: Diagnosis not present

## 2021-09-29 DIAGNOSIS — Z7901 Long term (current) use of anticoagulants: Secondary | ICD-10-CM

## 2021-09-29 DIAGNOSIS — E1151 Type 2 diabetes mellitus with diabetic peripheral angiopathy without gangrene: Secondary | ICD-10-CM | POA: Diagnosis not present

## 2021-09-29 DIAGNOSIS — I5032 Chronic diastolic (congestive) heart failure: Secondary | ICD-10-CM | POA: Diagnosis not present

## 2021-09-29 DIAGNOSIS — Z952 Presence of prosthetic heart valve: Secondary | ICD-10-CM | POA: Diagnosis not present

## 2021-09-29 DIAGNOSIS — E1142 Type 2 diabetes mellitus with diabetic polyneuropathy: Secondary | ICD-10-CM | POA: Diagnosis not present

## 2021-09-29 DIAGNOSIS — E1143 Type 2 diabetes mellitus with diabetic autonomic (poly)neuropathy: Secondary | ICD-10-CM | POA: Diagnosis not present

## 2021-09-29 LAB — POCT INR: INR: 3.2 — AB (ref 2.0–3.0)

## 2021-09-29 NOTE — Patient Instructions (Addendum)
Description   ?Spoke with [employer].  Reduce regimen to '3mg'$  daily except for 1.'5mg'$  on Tuesday and Friday. Repeat INR in 2 weeks. [phone] ?  ? ? ?

## 2021-09-30 DIAGNOSIS — E1151 Type 2 diabetes mellitus with diabetic peripheral angiopathy without gangrene: Secondary | ICD-10-CM | POA: Diagnosis not present

## 2021-09-30 DIAGNOSIS — I110 Hypertensive heart disease with heart failure: Secondary | ICD-10-CM | POA: Diagnosis not present

## 2021-09-30 DIAGNOSIS — I5032 Chronic diastolic (congestive) heart failure: Secondary | ICD-10-CM | POA: Diagnosis not present

## 2021-09-30 DIAGNOSIS — M80052D Age-related osteoporosis with current pathological fracture, left femur, subsequent encounter for fracture with routine healing: Secondary | ICD-10-CM | POA: Diagnosis not present

## 2021-09-30 DIAGNOSIS — E1143 Type 2 diabetes mellitus with diabetic autonomic (poly)neuropathy: Secondary | ICD-10-CM | POA: Diagnosis not present

## 2021-09-30 DIAGNOSIS — E1142 Type 2 diabetes mellitus with diabetic polyneuropathy: Secondary | ICD-10-CM | POA: Diagnosis not present

## 2021-10-06 DIAGNOSIS — S72462D Displaced supracondylar fracture with intracondylar extension of lower end of left femur, subsequent encounter for closed fracture with routine healing: Secondary | ICD-10-CM | POA: Diagnosis not present

## 2021-10-07 DIAGNOSIS — I110 Hypertensive heart disease with heart failure: Secondary | ICD-10-CM | POA: Diagnosis not present

## 2021-10-07 DIAGNOSIS — E1151 Type 2 diabetes mellitus with diabetic peripheral angiopathy without gangrene: Secondary | ICD-10-CM | POA: Diagnosis not present

## 2021-10-07 DIAGNOSIS — M80052D Age-related osteoporosis with current pathological fracture, left femur, subsequent encounter for fracture with routine healing: Secondary | ICD-10-CM | POA: Diagnosis not present

## 2021-10-07 DIAGNOSIS — I5032 Chronic diastolic (congestive) heart failure: Secondary | ICD-10-CM | POA: Diagnosis not present

## 2021-10-07 DIAGNOSIS — E1142 Type 2 diabetes mellitus with diabetic polyneuropathy: Secondary | ICD-10-CM | POA: Diagnosis not present

## 2021-10-07 DIAGNOSIS — E1143 Type 2 diabetes mellitus with diabetic autonomic (poly)neuropathy: Secondary | ICD-10-CM | POA: Diagnosis not present

## 2021-10-08 ENCOUNTER — Other Ambulatory Visit: Payer: Self-pay | Admitting: Medical

## 2021-10-08 DIAGNOSIS — M80052D Age-related osteoporosis with current pathological fracture, left femur, subsequent encounter for fracture with routine healing: Secondary | ICD-10-CM | POA: Diagnosis not present

## 2021-10-08 DIAGNOSIS — I5032 Chronic diastolic (congestive) heart failure: Secondary | ICD-10-CM | POA: Diagnosis not present

## 2021-10-08 DIAGNOSIS — E1151 Type 2 diabetes mellitus with diabetic peripheral angiopathy without gangrene: Secondary | ICD-10-CM | POA: Diagnosis not present

## 2021-10-08 DIAGNOSIS — E1142 Type 2 diabetes mellitus with diabetic polyneuropathy: Secondary | ICD-10-CM | POA: Diagnosis not present

## 2021-10-08 DIAGNOSIS — I110 Hypertensive heart disease with heart failure: Secondary | ICD-10-CM | POA: Diagnosis not present

## 2021-10-08 DIAGNOSIS — E1143 Type 2 diabetes mellitus with diabetic autonomic (poly)neuropathy: Secondary | ICD-10-CM | POA: Diagnosis not present

## 2021-10-12 DIAGNOSIS — E1151 Type 2 diabetes mellitus with diabetic peripheral angiopathy without gangrene: Secondary | ICD-10-CM | POA: Diagnosis not present

## 2021-10-12 DIAGNOSIS — E1142 Type 2 diabetes mellitus with diabetic polyneuropathy: Secondary | ICD-10-CM | POA: Diagnosis not present

## 2021-10-12 DIAGNOSIS — I5032 Chronic diastolic (congestive) heart failure: Secondary | ICD-10-CM | POA: Diagnosis not present

## 2021-10-12 DIAGNOSIS — M80052D Age-related osteoporosis with current pathological fracture, left femur, subsequent encounter for fracture with routine healing: Secondary | ICD-10-CM | POA: Diagnosis not present

## 2021-10-12 DIAGNOSIS — E1143 Type 2 diabetes mellitus with diabetic autonomic (poly)neuropathy: Secondary | ICD-10-CM | POA: Diagnosis not present

## 2021-10-12 DIAGNOSIS — I110 Hypertensive heart disease with heart failure: Secondary | ICD-10-CM | POA: Diagnosis not present

## 2021-10-15 ENCOUNTER — Telehealth: Payer: Self-pay | Admitting: Medical

## 2021-10-15 DIAGNOSIS — E785 Hyperlipidemia, unspecified: Secondary | ICD-10-CM | POA: Diagnosis not present

## 2021-10-15 DIAGNOSIS — M80052D Age-related osteoporosis with current pathological fracture, left femur, subsequent encounter for fracture with routine healing: Secondary | ICD-10-CM | POA: Diagnosis not present

## 2021-10-15 DIAGNOSIS — E1151 Type 2 diabetes mellitus with diabetic peripheral angiopathy without gangrene: Secondary | ICD-10-CM | POA: Diagnosis not present

## 2021-10-15 DIAGNOSIS — E1142 Type 2 diabetes mellitus with diabetic polyneuropathy: Secondary | ICD-10-CM | POA: Diagnosis not present

## 2021-10-15 DIAGNOSIS — Z7984 Long term (current) use of oral hypoglycemic drugs: Secondary | ICD-10-CM | POA: Diagnosis not present

## 2021-10-15 DIAGNOSIS — I110 Hypertensive heart disease with heart failure: Secondary | ICD-10-CM | POA: Diagnosis not present

## 2021-10-15 DIAGNOSIS — N179 Acute kidney failure, unspecified: Secondary | ICD-10-CM | POA: Diagnosis not present

## 2021-10-15 DIAGNOSIS — I25119 Atherosclerotic heart disease of native coronary artery with unspecified angina pectoris: Secondary | ICD-10-CM | POA: Diagnosis not present

## 2021-10-15 DIAGNOSIS — E1143 Type 2 diabetes mellitus with diabetic autonomic (poly)neuropathy: Secondary | ICD-10-CM | POA: Diagnosis not present

## 2021-10-15 DIAGNOSIS — Z951 Presence of aortocoronary bypass graft: Secondary | ICD-10-CM | POA: Diagnosis not present

## 2021-10-15 DIAGNOSIS — M48061 Spinal stenosis, lumbar region without neurogenic claudication: Secondary | ICD-10-CM | POA: Diagnosis not present

## 2021-10-15 DIAGNOSIS — D509 Iron deficiency anemia, unspecified: Secondary | ICD-10-CM | POA: Diagnosis not present

## 2021-10-15 DIAGNOSIS — Z5181 Encounter for therapeutic drug level monitoring: Secondary | ICD-10-CM | POA: Diagnosis not present

## 2021-10-15 DIAGNOSIS — I080 Rheumatic disorders of both mitral and aortic valves: Secondary | ICD-10-CM | POA: Diagnosis not present

## 2021-10-15 DIAGNOSIS — I214 Non-ST elevation (NSTEMI) myocardial infarction: Secondary | ICD-10-CM | POA: Diagnosis not present

## 2021-10-15 DIAGNOSIS — Z952 Presence of prosthetic heart valve: Secondary | ICD-10-CM | POA: Diagnosis not present

## 2021-10-15 DIAGNOSIS — Z7901 Long term (current) use of anticoagulants: Secondary | ICD-10-CM | POA: Diagnosis not present

## 2021-10-15 DIAGNOSIS — F32A Depression, unspecified: Secondary | ICD-10-CM | POA: Diagnosis not present

## 2021-10-15 DIAGNOSIS — Z7982 Long term (current) use of aspirin: Secondary | ICD-10-CM | POA: Diagnosis not present

## 2021-10-15 DIAGNOSIS — Z9181 History of falling: Secondary | ICD-10-CM | POA: Diagnosis not present

## 2021-10-15 DIAGNOSIS — I5032 Chronic diastolic (congestive) heart failure: Secondary | ICD-10-CM | POA: Diagnosis not present

## 2021-10-15 DIAGNOSIS — F419 Anxiety disorder, unspecified: Secondary | ICD-10-CM | POA: Diagnosis not present

## 2021-10-15 DIAGNOSIS — M5412 Radiculopathy, cervical region: Secondary | ICD-10-CM | POA: Diagnosis not present

## 2021-10-15 NOTE — Telephone Encounter (Signed)
Per Jarrett Soho CMA keep patient with PCP  I did not remove

## 2021-10-15 NOTE — Telephone Encounter (Signed)
Spoke with Abigail Oliver and she stated Abigail Oliver told her that was someone called her from [place] stating she wasn't house bound anymore , and she could be released and Abigail Oliver made Abigail Oliver and me aware that no one from her office has released her from care   Abigail Oliver , asked her why she wanted to be discharged and she stated someone from [place] told her she could be released and she couldn't think of their name and I offered her an appointment , she stated she had an virtual appointment scheduled for this week but  you never came onto the visit , I told her she didn't have a virtual appointment with our office this week. She stated she doesn't want a office visit , virtual visit or home health services.

## 2021-10-15 NOTE — Telephone Encounter (Signed)
Patient told Georga Kaufmann that she did not want to continue care with [facility] after this  She does not see the point in doing [facility] if she doesn't want it   I have removed PCP from list

## 2021-10-15 NOTE — Telephone Encounter (Signed)
[facility] calling again for verbal orders... patient DOES NOT want to continue  They need discharge orders for that to happen

## 2021-10-15 NOTE — Telephone Encounter (Signed)
[facility]) called stating pt wishes to be discharged from [facility] services.

## 2021-10-16 ENCOUNTER — Ambulatory Visit (INDEPENDENT_AMBULATORY_CARE_PROVIDER_SITE_OTHER): Payer: Medicare Other

## 2021-10-16 DIAGNOSIS — I5032 Chronic diastolic (congestive) heart failure: Secondary | ICD-10-CM | POA: Diagnosis not present

## 2021-10-16 DIAGNOSIS — M80052D Age-related osteoporosis with current pathological fracture, left femur, subsequent encounter for fracture with routine healing: Secondary | ICD-10-CM | POA: Diagnosis not present

## 2021-10-16 DIAGNOSIS — Z7901 Long term (current) use of anticoagulants: Secondary | ICD-10-CM | POA: Diagnosis not present

## 2021-10-16 DIAGNOSIS — E1142 Type 2 diabetes mellitus with diabetic polyneuropathy: Secondary | ICD-10-CM | POA: Diagnosis not present

## 2021-10-16 DIAGNOSIS — E1143 Type 2 diabetes mellitus with diabetic autonomic (poly)neuropathy: Secondary | ICD-10-CM | POA: Diagnosis not present

## 2021-10-16 DIAGNOSIS — Z952 Presence of prosthetic heart valve: Secondary | ICD-10-CM | POA: Diagnosis not present

## 2021-10-16 DIAGNOSIS — I110 Hypertensive heart disease with heart failure: Secondary | ICD-10-CM | POA: Diagnosis not present

## 2021-10-16 DIAGNOSIS — E1151 Type 2 diabetes mellitus with diabetic peripheral angiopathy without gangrene: Secondary | ICD-10-CM | POA: Diagnosis not present

## 2021-10-16 LAB — POCT INR: INR: 2.4 (ref 2.0–3.0)

## 2021-10-16 NOTE — Telephone Encounter (Signed)
Spoke with patient to offer appointment and determine need for [facility]. Declined appointment.  Patient states she has "lost trust" in relationship with provider after several failed virtual visit attempts. Stating that she never received calls. She is choosing to go to an Engineer, structural that specializes in Geriatric care. She states the rest of her family that are [place] patients will continue to see their current PCP.

## 2021-10-16 NOTE — Telephone Encounter (Signed)
Can you assist Abigail Oliver and Abigail Oliver with this?

## 2021-10-16 NOTE — Patient Instructions (Signed)
TAKE 1 TABLET TODAY ONLY and then continue 1 tablet daily except for 0.5 tablet on Tuesday and Friday. Repeat INR in 4 weeks. [phone]

## 2021-10-21 ENCOUNTER — Other Ambulatory Visit (HOSPITAL_COMMUNITY): Payer: Self-pay | Admitting: Cardiology

## 2021-10-21 DIAGNOSIS — Z952 Presence of prosthetic heart valve: Secondary | ICD-10-CM

## 2021-10-21 DIAGNOSIS — F331 Major depressive disorder, recurrent, moderate: Secondary | ICD-10-CM | POA: Diagnosis not present

## 2021-10-21 NOTE — Telephone Encounter (Signed)
Prescription refill request received for warfarin Lov: 01/30/21 Gwenlyn Found)  Next INR check: 11/13/21 Warfarin tablet strength: '3mg'$   Appropriate dose and refill sent to requested pharmacy.

## 2021-11-03 DIAGNOSIS — S72462D Displaced supracondylar fracture with intracondylar extension of lower end of left femur, subsequent encounter for closed fracture with routine healing: Secondary | ICD-10-CM | POA: Diagnosis not present

## 2021-11-05 ENCOUNTER — Other Ambulatory Visit: Payer: Self-pay | Admitting: Medical

## 2021-11-05 DIAGNOSIS — F410 Panic disorder [episodic paroxysmal anxiety] without agoraphobia: Secondary | ICD-10-CM | POA: Diagnosis not present

## 2021-11-05 DIAGNOSIS — F331 Major depressive disorder, recurrent, moderate: Secondary | ICD-10-CM | POA: Diagnosis not present

## 2021-11-13 ENCOUNTER — Ambulatory Visit (INDEPENDENT_AMBULATORY_CARE_PROVIDER_SITE_OTHER): Payer: Medicare Other

## 2021-11-13 DIAGNOSIS — Z7901 Long term (current) use of anticoagulants: Secondary | ICD-10-CM | POA: Diagnosis not present

## 2021-11-13 DIAGNOSIS — Z952 Presence of prosthetic heart valve: Secondary | ICD-10-CM | POA: Diagnosis not present

## 2021-11-13 LAB — POCT INR: INR: 2.1 (ref 2.0–3.0)

## 2021-11-16 DIAGNOSIS — S7290XA Unspecified fracture of unspecified femur, initial encounter for closed fracture: Secondary | ICD-10-CM | POA: Diagnosis not present

## 2021-11-16 DIAGNOSIS — N1832 Chronic kidney disease, stage 3b: Secondary | ICD-10-CM | POA: Diagnosis not present

## 2021-11-16 DIAGNOSIS — E1140 Type 2 diabetes mellitus with diabetic neuropathy, unspecified: Secondary | ICD-10-CM | POA: Diagnosis not present

## 2021-11-16 DIAGNOSIS — M810 Age-related osteoporosis without current pathological fracture: Secondary | ICD-10-CM | POA: Diagnosis not present

## 2021-11-16 DIAGNOSIS — R197 Diarrhea, unspecified: Secondary | ICD-10-CM | POA: Diagnosis not present

## 2021-11-17 DIAGNOSIS — Z961 Presence of intraocular lens: Secondary | ICD-10-CM | POA: Diagnosis not present

## 2021-11-17 DIAGNOSIS — E119 Type 2 diabetes mellitus without complications: Secondary | ICD-10-CM | POA: Diagnosis not present

## 2021-11-17 DIAGNOSIS — H04123 Dry eye syndrome of bilateral lacrimal glands: Secondary | ICD-10-CM | POA: Diagnosis not present

## 2021-11-17 DIAGNOSIS — H401131 Primary open-angle glaucoma, bilateral, mild stage: Secondary | ICD-10-CM | POA: Diagnosis not present

## 2021-11-17 DIAGNOSIS — Z7984 Long term (current) use of oral hypoglycemic drugs: Secondary | ICD-10-CM | POA: Diagnosis not present

## 2021-11-23 ENCOUNTER — Telehealth: Payer: Self-pay | Admitting: Medical

## 2021-11-23 NOTE — Telephone Encounter (Signed)
I am no longer pt pcp. Placing [facility] mammogram order to be faxt to [place]. Also can call [facility] at [phone] so they can talk directly to patient get name of new provider with [facility] so they can coordinate mammogram.  [name], PA-C

## 2021-11-23 NOTE — Telephone Encounter (Signed)
Form faxed to [facility]  , will remove you as PCP

## 2021-11-24 ENCOUNTER — Other Ambulatory Visit: Payer: Self-pay | Admitting: Medical

## 2021-11-26 ENCOUNTER — Telehealth: Payer: Self-pay | Admitting: Pharmacy Technician

## 2021-11-26 ENCOUNTER — Other Ambulatory Visit: Payer: Self-pay

## 2021-11-26 DIAGNOSIS — M810 Age-related osteoporosis without current pathological fracture: Secondary | ICD-10-CM | POA: Insufficient documentation

## 2021-11-26 NOTE — Telephone Encounter (Signed)
Auth Submission: NO AUTH NEEDED Payer: MEDICARE A/B Medication & CPT/J Code(s) submitted: Reclast (Zolendronic acid) J3489 Route of submission (phone, fax, portal): PHONE Auth type: Buy/Bill Units/visits requested: X1 DOSE Reference number:  Approval from: 11/26/21 to 05/23/22  Patient will be scheduled as soon as possible

## 2021-12-01 ENCOUNTER — Other Ambulatory Visit: Payer: Self-pay | Admitting: Medical

## 2021-12-01 DIAGNOSIS — S72462D Displaced supracondylar fracture with intracondylar extension of lower end of left femur, subsequent encounter for closed fracture with routine healing: Secondary | ICD-10-CM | POA: Diagnosis not present

## 2021-12-04 DIAGNOSIS — Z780 Asymptomatic menopausal state: Secondary | ICD-10-CM | POA: Diagnosis not present

## 2021-12-04 DIAGNOSIS — Z1231 Encounter for screening mammogram for malignant neoplasm of breast: Secondary | ICD-10-CM | POA: Diagnosis not present

## 2021-12-04 DIAGNOSIS — M810 Age-related osteoporosis without current pathological fracture: Secondary | ICD-10-CM | POA: Diagnosis not present

## 2021-12-11 ENCOUNTER — Ambulatory Visit (INDEPENDENT_AMBULATORY_CARE_PROVIDER_SITE_OTHER): Payer: Medicare Other | Admitting: *Deleted

## 2021-12-11 DIAGNOSIS — Z952 Presence of prosthetic heart valve: Secondary | ICD-10-CM

## 2021-12-11 DIAGNOSIS — Z7901 Long term (current) use of anticoagulants: Secondary | ICD-10-CM | POA: Diagnosis not present

## 2021-12-11 DIAGNOSIS — S83282D Other tear of lateral meniscus, current injury, left knee, subsequent encounter: Secondary | ICD-10-CM | POA: Diagnosis not present

## 2021-12-11 DIAGNOSIS — M25562 Pain in left knee: Secondary | ICD-10-CM | POA: Diagnosis not present

## 2021-12-11 LAB — POCT INR: INR: 2.6 (ref 2.0–3.0)

## 2021-12-11 NOTE — Patient Instructions (Signed)
Description   Continue 1 tablet daily except for 1/2 tablet on Tuesday and Friday. Repeat INR in 5 weeks. [phone]

## 2022-01-08 ENCOUNTER — Other Ambulatory Visit: Payer: Self-pay

## 2022-01-08 MED ORDER — POTASSIUM CHLORIDE CRYS ER 20 MEQ PO TBCR: 20.0000 meq | EXTENDED_RELEASE_TABLET | Freq: Three times a day (TID) | ORAL | 5 refills | Status: DC

## 2022-01-09 ENCOUNTER — Other Ambulatory Visit: Payer: Self-pay | Admitting: Medical

## 2022-01-11 ENCOUNTER — Ambulatory Visit (INDEPENDENT_AMBULATORY_CARE_PROVIDER_SITE_OTHER): Payer: Medicare Other | Admitting: *Deleted

## 2022-01-11 DIAGNOSIS — Z952 Presence of prosthetic heart valve: Secondary | ICD-10-CM

## 2022-01-11 DIAGNOSIS — Z7901 Long term (current) use of anticoagulants: Secondary | ICD-10-CM | POA: Diagnosis not present

## 2022-01-11 LAB — POCT INR: INR: 4.3 — AB (ref 2.0–3.0)

## 2022-01-11 NOTE — Patient Instructions (Signed)
Description   Do not take any warfarin today then continue taking 1 tablet daily except for 1/2 tablet on Tuesday and Friday. Repeat INR in 3 weeks. [phone]

## 2022-01-19 DIAGNOSIS — S72462D Displaced supracondylar fracture with intracondylar extension of lower end of left femur, subsequent encounter for closed fracture with routine healing: Secondary | ICD-10-CM | POA: Diagnosis not present

## 2022-02-03 ENCOUNTER — Ambulatory Visit (HOSPITAL_COMMUNITY)
Admission: RE | Admit: 2022-02-03 | Discharge: 2022-02-03 | Disposition: A | Discharge status: Home / Self Care | Payer: Medicare Other | Source: Ambulatory Visit | Attending: Cardiovascular Disease | Admitting: Cardiovascular Disease

## 2022-02-03 ENCOUNTER — Ambulatory Visit (INDEPENDENT_AMBULATORY_CARE_PROVIDER_SITE_OTHER): Payer: Medicare Other | Admitting: *Deleted

## 2022-02-03 DIAGNOSIS — I739 Peripheral vascular disease, unspecified: Secondary | ICD-10-CM | POA: Insufficient documentation

## 2022-02-03 DIAGNOSIS — Z95820 Peripheral vascular angioplasty status with implants and grafts: Secondary | ICD-10-CM | POA: Diagnosis not present

## 2022-02-03 DIAGNOSIS — Z952 Presence of prosthetic heart valve: Secondary | ICD-10-CM | POA: Diagnosis not present

## 2022-02-03 DIAGNOSIS — Z7901 Long term (current) use of anticoagulants: Secondary | ICD-10-CM | POA: Insufficient documentation

## 2022-02-03 LAB — POCT INR: INR: 5.3 — AB (ref 2.0–3.0)

## 2022-02-03 NOTE — Patient Instructions (Addendum)
Description   Do not take any warfarin today and no warfarin tomorrow then start taking 1 tablet daily except for 1/2 tablet on Sunday, Tuesday, and Friday. Repeat INR in 12 days. [phone]

## 2022-02-15 ENCOUNTER — Ambulatory Visit: Payer: Medicare Other | Attending: Cardiology | Admitting: *Deleted

## 2022-02-15 ENCOUNTER — Other Ambulatory Visit: Payer: Self-pay

## 2022-02-15 DIAGNOSIS — Z952 Presence of prosthetic heart valve: Secondary | ICD-10-CM

## 2022-02-15 DIAGNOSIS — Z7901 Long term (current) use of anticoagulants: Secondary | ICD-10-CM | POA: Diagnosis not present

## 2022-02-15 LAB — POCT INR: INR: 2.8 (ref 2.0–3.0)

## 2022-02-15 MED ORDER — WARFARIN SODIUM 3 MG PO TABS: ORAL_TABLET | ORAL | 0 refills | Status: DC

## 2022-02-15 NOTE — Telephone Encounter (Signed)
Prescription refill request received for warfarin Lov: 01/30/21 Abigail Oliver)  Next INR check: 03/08/22 Warfarin tablet strength: '3mg'$   Pt overdue to see cardiologist. Note placed on next anticoagulation clinic appt to schedule cardiology appt. Appropriate dose and refill sent to requested pharmacy.

## 2022-02-15 NOTE — Patient Instructions (Signed)
Description   Continue taking 1 tablet daily except for 1/2 tablet on Sunday, Tuesday, and Friday. Repeat INR in 3 weeks. [phone]

## 2022-02-17 DIAGNOSIS — H401131 Primary open-angle glaucoma, bilateral, mild stage: Secondary | ICD-10-CM | POA: Diagnosis not present

## 2022-03-08 ENCOUNTER — Other Ambulatory Visit: Payer: Self-pay | Admitting: Medical

## 2022-03-08 ENCOUNTER — Ambulatory Visit: Payer: Medicare Other | Attending: Cardiology

## 2022-03-08 DIAGNOSIS — Z7901 Long term (current) use of anticoagulants: Secondary | ICD-10-CM | POA: Diagnosis not present

## 2022-03-08 DIAGNOSIS — Z952 Presence of prosthetic heart valve: Secondary | ICD-10-CM | POA: Diagnosis not present

## 2022-03-08 LAB — POCT INR: INR: 3.9 — AB (ref 2.0–3.0)

## 2022-03-08 NOTE — Patient Instructions (Signed)
HOLD TODAY ONLY then Continue taking 1 tablet daily except for 1/2 tablet on Tuesday, Thursday and Saturday. Repeat INR in 3 weeks. [phone]

## 2022-03-18 DIAGNOSIS — N1832 Chronic kidney disease, stage 3b: Secondary | ICD-10-CM | POA: Diagnosis not present

## 2022-03-18 DIAGNOSIS — E1140 Type 2 diabetes mellitus with diabetic neuropathy, unspecified: Secondary | ICD-10-CM | POA: Diagnosis not present

## 2022-03-18 DIAGNOSIS — R12 Heartburn: Secondary | ICD-10-CM | POA: Diagnosis not present

## 2022-03-18 DIAGNOSIS — M810 Age-related osteoporosis without current pathological fracture: Secondary | ICD-10-CM | POA: Diagnosis not present

## 2022-03-22 ENCOUNTER — Telehealth: Payer: Self-pay | Admitting: Cardiovascular Disease

## 2022-03-22 NOTE — Telephone Encounter (Signed)
*  STAT* If patient is at the pharmacy, call can be transferred to refill team.   1. Which medications need to be refilled? (please list name of each medication and dose if known)  metoprolol succinate (TOPROL-XL) 25 MG 24 hr tablet [[id number]]   2. Which pharmacy/location (including street and city if local pharmacy) is medication to be sent to?  [id number] - [place], [address] Phone:  [phone]  Fax:  [phone]      3. Do they need a 30 day or 90 day supply? 90  (pt called and spoke to after hours service)

## 2022-03-23 ENCOUNTER — Ambulatory Visit: Payer: Medicare Other | Attending: Cardiovascular Disease | Admitting: Cardiovascular Disease

## 2022-03-23 ENCOUNTER — Encounter: Payer: Self-pay | Admitting: Cardiovascular Disease

## 2022-03-23 DIAGNOSIS — E785 Hyperlipidemia, unspecified: Secondary | ICD-10-CM | POA: Diagnosis not present

## 2022-03-23 DIAGNOSIS — Z952 Presence of prosthetic heart valve: Secondary | ICD-10-CM | POA: Insufficient documentation

## 2022-03-23 DIAGNOSIS — I10 Essential (primary) hypertension: Secondary | ICD-10-CM | POA: Diagnosis not present

## 2022-03-23 DIAGNOSIS — I2510 Atherosclerotic heart disease of native coronary artery without angina pectoris: Secondary | ICD-10-CM | POA: Diagnosis not present

## 2022-03-23 DIAGNOSIS — I5189 Other ill-defined heart diseases: Secondary | ICD-10-CM | POA: Insufficient documentation

## 2022-03-23 DIAGNOSIS — I739 Peripheral vascular disease, unspecified: Secondary | ICD-10-CM | POA: Diagnosis not present

## 2022-03-23 MED ORDER — METOPROLOL SUCCINATE ER 25 MG PO TB24: 75.0000 mg | ORAL_TABLET | Freq: Every day | ORAL | 3 refills | Status: DC

## 2022-03-23 NOTE — Progress Notes (Signed)
03/23/2022 Abigail Oliver   1946/05/19  [id number]  Primary Physician Pcp, No Primary Cardiologist: Lorretta Harp MD Abigail Oliver, [place]  HPI:  Abigail Oliver is a 76 y.o.   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  She has also seen Dr. [name] for diastolic heart failure.  She was referred to me through the courtesy of Dr. Ellouise Newer for peripheral vascular evaluation because of lifestyle limiting claudication in her right leg.  I last saw her in the office 01/30/2021.  She is accompanied by her husband [name] today.  Her other problems include hypertension, hyperlipidemia and diabetes. She has had coronary bypass grafting in 2000 as well as aortic valve replacement with a St. Jude aortic valve. Her Myoview performed within the last several weeks with nonischemic.  Lower extremity arterial Doppler studies performed prior to her intervention revealed a right ABI of 0.81 with a high-frequency signal in the mid right SFA the left ABI 1.1.    I performed PTA and nitinol self-expanding stenting of an 80% mid right SFA stenosis on 09/20/2011.  She did well after that although I did not see her back in the office.  She has been followed by Dr. [name] for diastolic heart failure.  She has complained of some back hip and knee pain as well as symptoms compatible with diabetic peripheral neuropathy although in talking to her today it does not sound as though she has claudication symptoms.  Doppler studies performed 01/23/2019 revealed normal ABIs bilaterally with a moderately elevated signal in the proximal right popliteal artery.  I suggested that we continue to follow that noninvasively and clinically.  Since I saw her in the office a year ago she did break her hip and had ACS during her hospitalization.  She underwent cardiac catheterization by Dr.Thukkani revealing an occluded RCA with a patent  RIMA to the distal RCA, patent proximal LAD stent with no other significant CAD.  She complains of pain in her arms and legs which sounds arthritic.  She denies chest pain or shortness of breath.  Her last Doppler study performed 02/03/2022 revealed a right ABI 1.09, left of 0.92 with a moderately elevated velocity in the mid right SFA which had not changed since her prior study a year ago.  Current Meds  Medication Sig   aspirin EC 81 MG tablet Take 81 mg by mouth every evening.   BIOTIN PO Take 1 tablet by mouth daily.   Capsaicin-Menthol (SALONPAS GEL-PATCH HOT EX) Apply 1 patch topically daily as needed (pain).   Cholecalciferol (VITAMIN D) 125 MCG (5000 UT) CAPS Take 5,000 Units by mouth daily.   ferrous sulfate 325 (65 FE) MG tablet Take 1 tablet (325 mg total) by mouth daily.   folic acid (FOLVITE) 1 MG tablet Take 1 mg by mouth daily.   latanoprost (XALATAN) 0.005 % ophthalmic solution PLACE 1 DROP INTO BOTH EYES AT BEDTIME. (Patient taking differently: Place 1 drop into both eyes at bedtime.)   metFORMIN (GLUCOPHAGE) 1000 MG tablet Take 500 mg by mouth daily with breakfast.   metoprolol succinate (TOPROL-XL) 25 MG 24 hr tablet Take 3 tablets (75 mg total) by mouth daily. Take with or immediately following a meal.   pantoprazole (PROTONIX) 40 MG tablet TAKE ONE TABLET BY MOUTH DAILY   potassium chloride SA (KLOR-CON M) 20 MEQ tablet Take 1 tablet (20 mEq total) by mouth  3 (three) times daily. Please keep appointment (Patient taking differently: Take 20 mEq by mouth 2 (two) times daily. Please keep appointment)   rosuvastatin (CRESTOR) 10 MG tablet Take 10 mg by mouth every evening.   sertraline (ZOLOFT) 25 MG tablet TAKE ONE TABLET BY MOUTH DAILY   torsemide (DEMADEX) 20 MG tablet Take 2 tablets (40 mg total) by mouth daily. (Patient taking differently: Take 40 mg by mouth every evening.)   traZODone (DESYREL) 50 MG tablet TAKE 1/2 TO 1 TABLET BY MOUTH EVERY NIGHT AT BEDTIME AS NEEDED FOR  SLEEP (Patient taking differently: Take 100 mg by mouth at bedtime.)   warfarin (COUMADIN) 3 MG tablet TAKE 1/2 TO 1 TABLET BY MOUTH DAILY AS DIRECTED   [DISCONTINUED] busPIRone (BUSPAR) 15 MG tablet TAKE ONE TABLET BY MOUTH THREE TIMES A DAY   [DISCONTINUED] enoxaparin (LOVENOX) 80 MG/0.8ML injection Inject 0.8 mLs (80 mg total) into the skin 2 (two) times daily.   [DISCONTINUED] HYDROcodone-acetaminophen (NORCO) 5-325 MG tablet Take 1 tablet by mouth every 6 (six) hours as needed for moderate pain.   [DISCONTINUED] HYDROcodone-acetaminophen (NORCO/VICODIN) 5-325 MG tablet Take 1 tablet by mouth every 6 (six) hours as needed for moderate pain.   [DISCONTINUED] Menthol, Topical Analgesic, (BIOFREEZE) 4 % GEL Apply 1 application. topically daily as needed (pain).   [DISCONTINUED] zolpidem (AMBIEN) 5 MG tablet Take 1 tablet (5 mg total) by mouth at bedtime as needed for sleep.     Allergies  Allergen Reactions   Atorvastatin     Muscle pain   Dapagliflozin     Other reaction(s): nausea   Gabapentin     Other reaction(s): stomach issues   Simvastatin Other (See Comments)    Muscle pain   Sulfa Antibiotics     unknown   Iodinated Contrast Media Rash     Red rash after cardiac cath 1 wk ago, ? Contrast allergy, requires 13 hr prep now per [email or web address]      Social History   Socioeconomic History   Marital status: Married    Spouse name: Abigail Oliver   Number of children: 2   Years of education: 14   Highest education level: Not on file  Occupational History    Comment: hair dresser  Tobacco Use   Smoking status: Never   Smokeless tobacco: Never  Substance and Sexual Activity   Alcohol use: No   Drug use: No   Sexual activity: Never  Other Topics Concern   Not on file  Social History Narrative   Pt is a high school graduate with 2 years of college. Married in 1967 she has 1 son born 72 and 1 daughter born 5 and 1 grandchild. Pt works as a Armed forces technical officer  and her marriage is OK.      Caffeine use- 2-3 /day, soda, tea       Lives with husband one story home   Social Determinants of Health   Financial Resource Strain: Not on file  Food Insecurity: Not on file  Transportation Needs: Not on file  Physical Activity: Not on file  Stress: Not on file  Social Connections: Not on file  Intimate Partner Violence: Not on file     Review of Systems: General: negative for chills, fever, night sweats or weight changes.  Cardiovascular: negative for chest pain, dyspnea on exertion, edema, orthopnea, palpitations, paroxysmal nocturnal dyspnea or shortness of breath Dermatological: negative for rash Respiratory: negative for cough or wheezing Urologic: negative for hematuria Abdominal: negative for  nausea, vomiting, diarrhea, bright red blood per rectum, melena, or hematemesis Neurologic: negative for visual changes, syncope, or dizziness All other systems reviewed and are otherwise negative except as noted above.    Blood pressure (!) 132/54, pulse 71, height '5\' 2"'$  (1.575 m), weight 163 lb (73.9 kg).  General appearance: alert and no distress Neck: no adenopathy, no carotid bruit, no JVD, supple, symmetrical, trachea midline, and thyroid not enlarged, symmetric, no tenderness/mass/nodules Lungs: clear to auscultation bilaterally Heart: Crisp prosthetic aortic valve sounds Extremities: extremities normal, atraumatic, no cyanosis or edema Pulses: 2+ and symmetric Skin: Skin color, texture, turgor normal. No rashes or lesions Neurologic: Grossly normal  EKG not performed today  ASSESSMENT AND PLAN:   Dyslipidemia History of dyslipidemia on statin therapy with lipid profile performed 01/30/2021 revealing total cholesterol of 144, LDL 66 and HDL 43.  CAD, CABG 2000, low risk Myoview April 2011; 2015 + myoview History of CAD status post coronary artery bypass grafting x1 with a RIMA to the RCA and [place] AVR in 2000.  She had a heart  catheterization performed by Dr.Thukkani/17/23 revealing a patent RIMA to the RCA, patent LAD stent.  This was done in the setting of ACS during admission for hip fracture.  She currently denies chest pain or shortness of breath.  Diastolic dysfunction with NL LVF 2D August 2012 History of diastolic heart failure followed by Dr. [name] in the past.  Last echo performed/16/23 revealed normal LV systolic function with grade 2 diastolic dysfunction.  She is on torsemide.  Essential hypertension History of essential hypertension a blood pressure measured today at 132/54.  She is on metoprolol.  H/O aortic valve replacement History of Saint Jude AVR back in 2000.  Echo performed 09/06/2021 revealed a well-functioning aortic mechanical prosthesis.  PVD, Rt SFA PTA/Stent 09/20/11 History of peripheral arterial disease status post right SFA stenting by myself/29/13.  She is done well since.  She has chronic pain in both her legs and arms which do not sound vascular.  Her most recent Doppler studies performed 02/03/2022 revealed a right ABI 1.09, left of 0.92.  She did have a moderately elevated frequency in her mid right SFA which had not changed from the year before.  We will continue to monitor this on an annual basis.     Lorretta Harp MD FACP,FACC,FAHA, [facility] 03/23/2022 8:50 AM

## 2022-03-23 NOTE — Assessment & Plan Note (Signed)
History of Saint Jude AVR back in 2000.  Echo performed 09/06/2021 revealed a well-functioning aortic mechanical prosthesis.

## 2022-03-23 NOTE — Assessment & Plan Note (Signed)
History of dyslipidemia on statin therapy with lipid profile performed 01/30/2021 revealing total cholesterol of 144, LDL 66 and HDL 43.

## 2022-03-23 NOTE — Assessment & Plan Note (Signed)
History of diastolic heart failure followed by Dr. [name] in the past.  Last echo performed/16/23 revealed normal LV systolic function with grade 2 diastolic dysfunction.  She is on torsemide.

## 2022-03-23 NOTE — Assessment & Plan Note (Signed)
History of peripheral arterial disease status post right SFA stenting by myself/29/13.  She is done well since.  She has chronic pain in both her legs and arms which do not sound vascular.  Her most recent Doppler studies performed 02/03/2022 revealed a right ABI 1.09, left of 0.92.  She did have a moderately elevated frequency in her mid right SFA which had not changed from the year before.  We will continue to monitor this on an annual basis.

## 2022-03-23 NOTE — Assessment & Plan Note (Signed)
History of essential hypertension a blood pressure measured today at 132/54.  She is on metoprolol.

## 2022-03-23 NOTE — Assessment & Plan Note (Signed)
History of CAD status post coronary artery bypass grafting x1 with a RIMA to the RCA and [place] AVR in 2000.  She had a heart catheterization performed by Dr.Thukkani/17/23 revealing a patent RIMA to the RCA, patent LAD stent.  This was done in the setting of ACS during admission for hip fracture.  She currently denies chest pain or shortness of breath.

## 2022-03-23 NOTE — Patient Instructions (Signed)
Medication Instructions:  Your physician recommends that you continue on your current medications as directed. Please refer to the Current Medication list given to you today.  *If you need a refill on your cardiac medications before your next appointment, please call your pharmacy*   Testing/Procedures: Your physician has requested that you have an echocardiogram. Echocardiography is a painless test that uses sound waves to create images of your heart. It provides your doctor with information about the size and shape of your heart and how well your heart's chambers and valves are working. This procedure takes approximately one hour. There are no restrictions for this procedure. Please do NOT wear cologne, perfume, aftershave, or lotions (deodorant is allowed). Please arrive 15 minutes prior to your appointment time. To be done in April 2024. This procedure will be done at [address] [place] has requested that you have a lower extremity arterial duplex. This test is an ultrasound of the arteries in the legs. It looks at arterial blood flow in the legs. Allow one hour for Lower Arterial scans. There are no restrictions or special instructions To be done in September 2024. This procedure will be done at [place]  Your physician has requested that you have an ankle brachial index (ABI). During this test an ultrasound and blood pressure cuff are used to evaluate the arteries that supply the arms and legs with blood. Allow thirty minutes for this exam. There are no restrictions or special instructions. To be done in September 2024. This procedure will be done at [place]     Follow-Up: At [facility], you and your health needs are our priority.  As part of our continuing mission to provide you with exceptional heart care, we have created designated Provider Care Teams.  These Care Teams include your primary Cardiologist (physician)  and Advanced Practice Providers (APPs -  Physician Assistants and Nurse Practitioners) who all work together to provide you with the care you need, when you need it.  We recommend signing up for the patient portal called "MyChart".  Sign up information is provided on this After Visit Summary.  MyChart is used to connect with patients for Virtual Visits (Telemedicine).  Patients are able to view lab/test results, encounter notes, upcoming appointments, etc.  Non-urgent messages can be sent to your provider as well.   To learn more about what you can do with MyChart, go to [email or web address].    Your next appointment:   12 month(s)  The format for your next appointment:   In Person  Provider:   Quay Burow, MD

## 2022-03-29 ENCOUNTER — Ambulatory Visit: Payer: Medicare Other | Attending: Cardiology

## 2022-03-29 DIAGNOSIS — Z7901 Long term (current) use of anticoagulants: Secondary | ICD-10-CM | POA: Diagnosis not present

## 2022-03-29 DIAGNOSIS — Z952 Presence of prosthetic heart valve: Secondary | ICD-10-CM | POA: Diagnosis not present

## 2022-03-29 LAB — POCT INR: INR: 3.5 — AB (ref 2.0–3.0)

## 2022-03-29 NOTE — Patient Instructions (Signed)
Continue taking 1 tablet daily except for 1/2 tablet on Tuesday, Thursday and Saturday. Repeat INR in 6 weeks. [phone]

## 2022-04-23 ENCOUNTER — Encounter: Payer: Self-pay | Admitting: Pulmonary Disease

## 2022-04-26 ENCOUNTER — Encounter: Payer: Self-pay | Admitting: Adult Health

## 2022-04-26 ENCOUNTER — Ambulatory Visit (INDEPENDENT_AMBULATORY_CARE_PROVIDER_SITE_OTHER): Payer: Medicare Other | Admitting: Adult Health

## 2022-04-26 VITALS — BP 128/66 | HR 66 | Temp 96.3°F | Resp 20 | Ht 62.0 in | Wt 166.6 lb

## 2022-04-26 DIAGNOSIS — Z952 Presence of prosthetic heart valve: Secondary | ICD-10-CM | POA: Diagnosis not present

## 2022-04-26 DIAGNOSIS — I10 Essential (primary) hypertension: Secondary | ICD-10-CM

## 2022-04-26 DIAGNOSIS — G8929 Other chronic pain: Secondary | ICD-10-CM

## 2022-04-26 DIAGNOSIS — F5101 Primary insomnia: Secondary | ICD-10-CM

## 2022-04-26 DIAGNOSIS — I2510 Atherosclerotic heart disease of native coronary artery without angina pectoris: Secondary | ICD-10-CM | POA: Diagnosis not present

## 2022-04-26 DIAGNOSIS — K589 Irritable bowel syndrome without diarrhea: Secondary | ICD-10-CM

## 2022-04-26 DIAGNOSIS — I5032 Chronic diastolic (congestive) heart failure: Secondary | ICD-10-CM

## 2022-04-26 DIAGNOSIS — Z7689 Persons encountering health services in other specified circumstances: Secondary | ICD-10-CM | POA: Diagnosis not present

## 2022-04-26 DIAGNOSIS — F339 Major depressive disorder, recurrent, unspecified: Secondary | ICD-10-CM | POA: Diagnosis not present

## 2022-04-26 DIAGNOSIS — D509 Iron deficiency anemia, unspecified: Secondary | ICD-10-CM

## 2022-04-26 DIAGNOSIS — E1143 Type 2 diabetes mellitus with diabetic autonomic (poly)neuropathy: Secondary | ICD-10-CM | POA: Diagnosis not present

## 2022-04-26 DIAGNOSIS — E785 Hyperlipidemia, unspecified: Secondary | ICD-10-CM | POA: Diagnosis not present

## 2022-04-26 NOTE — Progress Notes (Addendum)
[facility]  Provider: Durenda Age DNP  Code Status:  Full Code  Goals of Care:     04/26/2022   12:21 PM  Advanced Directives  Does Patient Have a Medical Advance Directive? Yes  Type of Paramedic of [place];Living will;Out of facility DNR (pink MOST or yellow form)  Does patient want to make changes to medical advance directive? No - Patient declined     Chief Complaint  Patient presents with   New Patient (Initial Visit)    Patient presents today for new patient appointment.    HPI: Patient is a 76 y.o. female seen today to establish care with [facility]. She is diabetic and currently not taking her Metformin for a month. She stated that her Metformin is "messing up with her IBS". CBG this morning was 100 and 108 3 days ago. She does not check her CBG daily. She follows up with an endocrinologist. She takes Coumadin for history of aortic valve replacement. INRs  are checked at the [facility] with INR goal of 2.5 - 3.5. Lats INR was 3. She has a 51 year old daughter who is battling addiction to cocaine. She attends Narcotics Anonymous for support. She complained of having constant stiff neck. She has constant pain for 5 years. She uses bilateral hearing aids.  Past Medical History:  Diagnosis Date   Anemia    Anxiety    Aortic stenosis    a. Now has St Jude mechanical aortic valve (6/00). b. Echo (6/15) with EF 60-65%, mechanical aortic valve with mean gradient 27 mmHg.   Arthritis    a. Possible c-spine arthritis with pain down left arm.     Carotid artery disease (HCC)    a. Carotid dopplers (7/16) with 40-59% BICA stenosis.     Chronic angle-closure glaucoma(365.23)    Chronic diastolic CHF (congestive heart failure) ([place])    a.  RHC (7/15) with mean RA 2, PA 22/11, mean PCWP 9, CI 3.79.   Contrast media allergy    Coronary atherosclerosis of native coronary artery    a. CABG at time of AVR in 6/00 with RIMA-RCA. b. Abnl nuc 11/2013 ->  LHC (7/15) with patent RIMA-RCA, 80% mRCA, 80% pLAD with FFR 0.73, treated with DES to pLAD.   Depression    Essential hypertension    GERD (gastroesophageal reflux disease)    Hyperlipidemia    Low back pain    Neuromuscular disorder (HCC)    neuropathy   Peripheral vascular disease (HCC)    a, H/o Right SFA stent. b. Peripheral arterial dopplers (7/16) with right SFA stent patent.     PONV (postoperative nausea and vomiting)    N&V   Type II diabetes mellitus ([place])     Past Surgical History:  Procedure Laterality Date   BILATERAL OOPHORECTOMY  05/24/1985   BREAST BIOPSY  05/24/2010   left   CARDIAC CATHETERIZATION  01/14/1999   normal LV function, severe aortic stenosis; 80% and 70% stenosis in RCA; mild 20% distal norrowing in L main with 20% proximal LAD stenosis, 40% diagonal stenosis and 20% proximal circumflex stenosis   CARDIAC VALVE REPLACEMENT  05/24/1998   aortic valve    CARDIOVASCULAR STRESS TEST  08/25/2011   R/L MV - EF 72%; no scintigraphic evidence of inducible MI; normal perfusionTID of 1.25 elevated - could indicate small vessle subendocardial ischemia; EKG NSR at 66, non diagnostic for ischemia   CARPAL TUNNEL RELEASE  05/25/1987   bilaterally   CATARACT  EXTRACTION W/ INTRAOCULAR LENS  IMPLANT, BILATERAL  ~ 2007   [place]; [place]  05/24/1988   COLONOSCOPY N/A 10/27/2012   Procedure: COLONOSCOPY;  Surgeon: Beryle Beams, MD;  Location: [facility];  Service: Endoscopy;  Laterality: N/A;   CORONARY ARTERY BYPASS GRAFT  05/24/1998   RIMA to RCA   CORONARY/GRAFT ANGIOGRAPHY N/A 09/07/2021   Procedure: CORONARY/GRAFT ANGIOGRAPHY;  Surgeon: Early Osmond, MD;  Location: [facility];  Service: Cardiovascular;  Laterality: N/A;   DOPPLER ECHOCARDIOGRAPHY  03/29/2012   EF >55%; mild concentric LVH; stage 1 diastolic dysfunction, elevated LV filling pressure, dilated LA; MAC mild MR; St Jude AVR peak and mean gradients of  80mHg and 279mg; transvalvular gradients have increased (prev 23 and 14 respectively)   ESOPHAGOGASTRODUODENOSCOPY N/A 10/27/2012   Procedure: ESOPHAGOGASTRODUODENOSCOPY (EGD);  Surgeon: PaBeryle BeamsMD;  [facility];  Service: Endoscopy;  Laterality: N/A;   FEMORAL ARTERY STENT  09/20/2011   6 x 40 Smart Nitinol self-expanding stent placed;  10/15/2031 -R SFA stent open and patent w/o evidence of restenosis   FRACTIONAL FLOW RESERVE WIRE  12/14/2013   Procedure: FRACTIONAL FLOW RESERVE WIRE;  Surgeon: DaLarey DresserMD;  [facility];  Service: Cardiovascular;;   KNEE SURGERY Left    LACERATION REPAIR     right hand   LEFT AND RIGHT HEART CATHETERIZATION WITH CORONARY ANGIOGRAM N/A 12/14/2013   Procedure: LEFT AND RIGHT HEART CATHETERIZATION WITH CORONARY ANGIOGRAM;  Surgeon: DaLarey DresserMD;  [facility];  Service: Cardiovascular;  Laterality: N/A;   LOWER EXTREMITY ANGIOGRAM N/A 09/20/2011   Procedure: LOWER EXTREMITY ANGIOGRAM;  Surgeon: JoLorretta HarpMD;  [facility];  Service: Cardiovascular;  Laterality: N/A;   LYMPH NODE BIOPSY  06/25/2011   "core needle on 5"   needle biopsy  05/25/2007   "on ankles for nerve damage"   NEUROPLASTY / TRANSPOSITION ULNAR NERVE AT ELBOW     right   ORIF FEMUR FRACTURE Left 09/09/2021   Procedure: OPEN REDUCTION INTERNAL FIXATION (ORIF) DISTAL FEMUR FRACTURE;  Surgeon: HaShona NeedlesMD;  [facility];  Laterality: Left;   PERCUTANEOUS CORONARY STENT INTERVENTION (PCI-S)  12/14/2013   Procedure: PERCUTANEOUS CORONARY STENT INTERVENTION (PCI-S);  Surgeon: DaLarey DresserMD;  [facility];  Service: Cardiovascular;;  Prox LAD 3.00x12 Promus DES    PERIPHERAL ARTERIAL STENT GRAFT  09/20/2011   right SFA   REFRACTIVE SURGERY  ` 2004   "for glaucoma"   TONSILLECTOMY  05/24/1966   TRIGGER FINGER RELEASE Left 12/04/2015   Procedure: LEFT RING FINGER TRIGGER  RELEASE;  Surgeon: KeLeanora CoverMD;  [facility];  Laterality: Left;   VAGINAL HYSTERECTOMY  05/25/1983   Fibroids    Allergies  Allergen Reactions   Atorvastatin     Muscle pain   Dapagliflozin     Other reaction(s): nausea   Gabapentin     Other reaction(s): stomach issues   Simvastatin Other (See Comments)    Muscle pain   Sulfa Antibiotics     unknown   Iodinated Contrast Media Rash     Red rash after cardiac cath 1 wk ago, ? Contrast allergy, requires 13 hr prep now per [email or web address]      Outpatient Encounter Medications as of 04/26/2022  Medication Sig   Ascorbic Acid (VITAMIN C) 1000 MG tablet Take 1,000 mg by  mouth daily.   aspirin EC 81 MG tablet Take 81 mg by mouth every evening.   BIOTIN PO Take 5,000 mg by mouth daily.   Capsaicin-Menthol (SALONPAS GEL-PATCH HOT EX) Apply 1 patch topically daily as needed (pain).   Cholecalciferol (VITAMIN D3 PO) Take 1,000 Units by mouth daily.   cyanocobalamin (VITAMIN B12) 1000 MCG tablet Take 1,000 mcg by mouth daily.   ferrous sulfate 325 (65 FE) MG tablet Take 1 tablet (325 mg total) by mouth daily.   folic acid (FOLVITE) 665 MCG tablet Take 800 mcg by mouth daily.   latanoprost (XALATAN) 0.005 % ophthalmic solution PLACE 1 DROP INTO BOTH EYES AT BEDTIME. (Patient taking differently: Place 1 drop into both eyes at bedtime.)   metoprolol succinate (TOPROL-XL) 25 MG 24 hr tablet Take 3 tablets (75 mg total) by mouth daily. Take with or immediately following a meal. (Patient taking differently: Take 50 mg by mouth daily. Take with or immediately following a meal.)   pantoprazole (PROTONIX) 40 MG tablet TAKE ONE TABLET BY MOUTH DAILY   potassium chloride SA (KLOR-CON M) 20 MEQ tablet Take 1 tablet (20 mEq total) by mouth 3 (three) times daily. Please keep appointment (Patient taking differently: Take 20 mEq by mouth 2 (two) times daily. Please keep appointment)   rosuvastatin  (CRESTOR) 10 MG tablet Take 10 mg by mouth every evening.   sertraline (ZOLOFT) 25 MG tablet TAKE ONE TABLET BY MOUTH DAILY   torsemide (DEMADEX) 20 MG tablet Take 2 tablets (40 mg total) by mouth daily.   traZODone (DESYREL) 50 MG tablet TAKE 1/2 TO 1 TABLET BY MOUTH EVERY NIGHT AT BEDTIME AS NEEDED FOR SLEEP (Patient taking differently: Take 100 mg by mouth at bedtime.)   warfarin (COUMADIN) 3 MG tablet TAKE 1/2 TO 1 TABLET BY MOUTH DAILY AS DIRECTED   metFORMIN (GLUCOPHAGE) 1000 MG tablet Take 500 mg by mouth daily with breakfast. (Patient not taking: Reported on 04/26/2022)   [DISCONTINUED] Cholecalciferol (VITAMIN D) 125 MCG (5000 UT) CAPS Take 5,000 Units by mouth daily.   [DISCONTINUED] folic acid (FOLVITE) 1 MG tablet Take 800 mcg by mouth daily.   No facility-administered encounter medications on file as of 04/26/2022.    Review of Systems:  Review of Systems  Constitutional:  Negative for appetite change, chills, fatigue and fever.  HENT:  Negative for congestion, hearing loss, rhinorrhea and sore throat.   Eyes: Negative.   Respiratory:  Negative for cough, shortness of breath and wheezing.   Cardiovascular:  Negative for chest pain, palpitations and leg swelling.  Gastrointestinal:  Negative for abdominal pain, constipation, diarrhea, nausea and vomiting.  Genitourinary:  Negative for dysuria.  Musculoskeletal:  Negative for arthralgias, back pain and myalgias.       All are  joints especially in the morning  Skin:  Negative for color change, rash and wound.  Neurological:  Negative for dizziness, weakness and headaches.  Psychiatric/Behavioral:  Positive for sleep disturbance. Negative for behavioral problems. The patient is not nervous/anxious.     Health Maintenance  Topic Date Due   Medicare Annual Wellness (AWV)  Never done   Zoster Vaccines- Shingrix (1 of 2) Never done   Pneumonia Vaccine 62+ Years old (1 - PCV) Never done   FOOT EXAM  02/19/2015   OPHTHALMOLOGY  EXAM  04/04/2019   INFLUENZA VACCINE  12/22/2021   COVID-19 Vaccine (4 - 2023-24 season) 01/22/2022   HEMOGLOBIN A1C  03/06/2022   Diabetic kidney evaluation - Urine ACR  05/11/2022   Diabetic kidney evaluation - GFR measurement  09/13/2022   DTaP/Tdap/Td (3 - Td or Tdap) 03/16/2026   DEXA SCAN  Completed   Hepatitis C Screening  Completed   HPV VACCINES  Aged Out   COLONOSCOPY (Pts 45-21yr Insurance coverage will need to be confirmed)  Discontinued    Physical Exam: Vitals:   04/26/22 1232  BP: 128/66  Pulse: 66  Resp: 20  Temp: (!) 96.3 F (35.7 C)  SpO2: 97%  Weight: 166 lb 9.6 oz (75.6 kg)  Height: _0  (1.575 m)   Body mass index is 30.47 kg/m. Physical Exam Constitutional:      General: She is not in acute distress.    Appearance: She is obese.  HENT:     Head: Normocephalic and atraumatic.     Nose: Nose normal.     Mouth/Throat:     Mouth: Mucous membranes are moist.  Eyes:     General: No scleral icterus (amb).    Conjunctiva/sclera: Conjunctivae normal.  Cardiovascular:     Rate and Rhythm: Normal rate and regular rhythm.  Pulmonary:     Effort: Pulmonary effort is normal.     Breath sounds: Normal breath sounds.  Abdominal:     General: Bowel sounds are normal.     Palpations: Abdomen is soft.  Musculoskeletal:        General: Swelling present. Normal range of motion.     Cervical back: Normal range of motion.     Comments: Left knee 1+edema  Skin:    General: Skin is warm and dry.  Neurological:     General: No focal deficit present.     Mental Status: She is alert and oriented to person, place, and time.  Psychiatric:        Mood and Affect: Mood normal.        Behavior: Behavior normal.        Thought Content: Thought content normal.        Judgment: Judgment normal.     Labs reviewed: Basic Metabolic Panel: Recent Labs    09/06/21 0514 09/07/21 1136 09/08/21 0448 09/09/21 0712 09/10/21 0412 09/11/21 0656 09/12/21 0318  NA 136    < > 134*   < > 135 137 141  K 4.0   < > 4.0   < > 4.2 3.9 3.8  CL 105   < > 102   < > 105 107 108  CO2 24   < > 19*   < > 19* 22 24  GLUCOSE 126*   < > 149*   < > 160* 104* 128*  BUN 26*   < > 44*   < > 40* 38* 33*  CREATININE 1.71*   < > 1.69*   < > 1.80* 1.76* 1.65*  CALCIUM 8.8*   < > 9.0   < > 8.8* 8.6* 8.7*  MG 1.9  --  2.3  --   --   --   --   PHOS 3.4  --   --   --   --   --   --    < > = values in this interval not displayed.   Liver Function Tests: No results for input(s): "AST", "ALT", "ALKPHOS", "BILITOT", "PROT", "ALBUMIN" in the last 8760 hours. No results for input(s): "LIPASE", "AMYLASE" in the last 8760 hours. No results for input(s): "AMMONIA" in the last 8760 hours. CBC: Recent Labs    09/10/21 0412 09/11/21 0656 09/12/21 00354  WBC 9.0 6.2 6.7  HGB 8.1* 7.4* 8.1*  HCT 26.8* 23.4* 26.7*  MCV 89.9 88.0 89.3  PLT 176 134* 164   Lipid Panel: No results for input(s): "CHOL", "HDL", "LDLCALC", "TRIG", "CHOLHDL", "LDLDIRECT" in the last 8760 hours. Lab Results  Component Value Date   HGBA1C 5.5 09/04/2021    Procedures since last visit: No results found.  Assessment/Plan  1. Encounter to establish care - Hemoglobin A1C; Future - CBC With Differential/Platelet; Future - CMP with eGFR([facility]); Future - TSH; Future  2. Irritable bowel syndrome, unspecified type -  stable, not on medication  3. Iron deficiency anemia, unspecified iron deficiency anemia type -  hgb 7.4, continue iron supplementation -  will re-check CBC  4. Type II diabetes mellitus with peripheral autonomic neuropathy (HCC) -   Metformin on hold -  follows up with endocrinologist - Hemoglobin A1C; Future - TSH; Future  5. Major depression, recurrent, chronic (HCC) -  denies feeling depressed -  continue Sertraline  6. Coronary artery disease involving native heart without angina pectoris, unspecified vessel or lesion type -  has history of cardiac catheterization -  denies  chest pain -  continue ASA and Rosuvastatin  7. History of aortic valve replacement -  continue Coumadin -  INR checks [facility]  8. Dyslipidemia -  continue Rosuvastatin  9. Essential hypertension -  BP 128/66, continue Metoprolol succinate - CBC With Differential/Platelet; Future - CMP with eGFR([facility]); Future  10. Chronic diastolic heart failure (HCC) -  no SOB, -  continue Torsemide -  follows up with cardiology  11. Primary insomnia -  continue Trazodone  12. Other chronic pain -  has constant pain X 5 years -  has constant stiff neck and has Left knee edema -  requesting referral to rheumatology - Ambulatory referral to Rheumatology-  discontinued 05/11/22 Addendum:  05/11/22 -  ambulatory referral to orthopedics   Labs/tests ordered:  - Hemoglobin A1C; Future - CBC With Differential/Platelet; Future - CMP with eGFR([facility]); Future - TSH; Future  Next appt:  in 1 month

## 2022-04-26 NOTE — Patient Instructions (Signed)
Preventive Care 65 Years and Older, Female Preventive care refers to lifestyle choices and visits with your health care provider that can promote health and wellness. Preventive care visits are also called wellness exams. What can I expect for my preventive care visit? Counseling Your health care provider may ask you questions about your: Medical history, including: Past medical problems. Family medical history. Pregnancy and menstrual history. History of falls. Current health, including: Memory and ability to understand (cognition). Emotional well-being. Home life and relationship well-being. Sexual activity and sexual health. Lifestyle, including: Alcohol, nicotine or tobacco, and drug use. Access to firearms. Diet, exercise, and sleep habits. Work and work environment. Sunscreen use. Safety issues such as seatbelt and bike helmet use. Physical exam Your health care provider will check your: Height and weight. These may be used to calculate your BMI (body mass index). BMI is a measurement that tells if you are at a healthy weight. Waist circumference. This measures the distance around your waistline. This measurement also tells if you are at a healthy weight and may help predict your risk of certain diseases, such as type 2 diabetes and high blood pressure. Heart rate and blood pressure. Body temperature. Skin for abnormal spots. What immunizations do I need?  Vaccines are usually given at various ages, according to a schedule. Your health care provider will recommend vaccines for you based on your age, medical history, and lifestyle or other factors, such as travel or where you work. What tests do I need? Screening Your health care provider may recommend screening tests for certain conditions. This may include: Lipid and cholesterol levels. Hepatitis C test. Hepatitis B test. HIV (human immunodeficiency virus) test. STI (sexually transmitted infection) testing, if you are at  risk. Lung cancer screening. Colorectal cancer screening. Diabetes screening. This is done by checking your blood sugar (glucose) after you have not eaten for a while (fasting). Mammogram. Talk with your health care provider about how often you should have regular mammograms. BRCA-related cancer screening. This may be done if you have a family history of breast, ovarian, tubal, or peritoneal cancers. Bone density scan. This is done to screen for osteoporosis. Talk with your health care provider about your test results, treatment options, and if necessary, the need for more tests. Follow these instructions at home: Eating and drinking  Eat a diet that includes fresh fruits and vegetables, whole grains, lean protein, and low-fat dairy products. Limit your intake of foods with high amounts of sugar, saturated fats, and salt. Take vitamin and mineral supplements as recommended by your health care provider. Do not drink alcohol if your health care provider tells you not to drink. If you drink alcohol: Limit how much you have to 0-1 drink a day. Know how much alcohol is in your drink. In the [place] one drink equals one 12 oz bottle of beer (355 mL), one 5 oz glass of wine (148 mL), or one 1 oz glass of hard liquor (44 mL). Lifestyle Brush your teeth every morning and night with fluoride toothpaste. Floss one time each day. Exercise for at least 30 minutes 5 or more days each week. Do not use any products that contain nicotine or tobacco. These products include cigarettes, chewing tobacco, and vaping devices, such as e-cigarettes. If you need help quitting, ask your health care provider. Do not use drugs. If you are sexually active, practice safe sex. Use a condom or other form of protection in order to prevent STIs. Take aspirin only as told by   your health care provider. Make sure that you understand how much to take and what form to take. Work with your health care provider to find out whether it  is safe and beneficial for you to take aspirin daily. Ask your health care provider if you need to take a cholesterol-lowering medicine (statin). Find healthy ways to manage stress, such as: Meditation, yoga, or listening to music. Journaling. Talking to a trusted person. Spending time with friends and family. Minimize exposure to UV radiation to reduce your risk of skin cancer. Safety Always wear your seat belt while driving or riding in a vehicle. Do not drive: If you have been drinking alcohol. Do not ride with someone who has been drinking. When you are tired or distracted. While texting. If you have been using any mind-altering substances or drugs. Wear a helmet and other protective equipment during sports activities. If you have firearms in your house, make sure you follow all gun safety procedures. What's next? Visit your health care provider once a year for an annual wellness visit. Ask your health care provider how often you should have your eyes and teeth checked. Stay up to date on all vaccines. This information is not intended to replace advice given to you by your health care provider. Make sure you discuss any questions you have with your health care provider. Document Revised: 11/05/2020 Document Reviewed: 11/05/2020 Elsevier Patient Education  2023 [employer]  

## 2022-04-27 ENCOUNTER — Other Ambulatory Visit: Payer: Medicare Other

## 2022-04-27 DIAGNOSIS — I10 Essential (primary) hypertension: Secondary | ICD-10-CM | POA: Diagnosis not present

## 2022-04-27 DIAGNOSIS — E1143 Type 2 diabetes mellitus with diabetic autonomic (poly)neuropathy: Secondary | ICD-10-CM

## 2022-04-28 LAB — COMPLETE METABOLIC PANEL WITH GFR
AG Ratio: 1.5 (calc) (ref 1.0–2.5)
ALT: 9 U/L (ref 6–29)
AST: 18 U/L (ref 10–35)
Albumin: 4.4 g/dL (ref 3.6–5.1)
Alkaline phosphatase (APISO): 97 U/L (ref 37–153)
BUN/Creatinine Ratio: 21 (calc) (ref 6–22)
BUN: 32 mg/dL — ABNORMAL HIGH (ref 7–25)
CO2: 30 mmol/L (ref 20–32)
Calcium: 9.5 mg/dL (ref 8.6–10.4)
Chloride: 102 mmol/L (ref 98–110)
Creat: 1.5 mg/dL — ABNORMAL HIGH (ref 0.60–1.00)
Globulin: 2.9 g/dL (calc) (ref 1.9–3.7)
Glucose, Bld: 162 mg/dL — ABNORMAL HIGH (ref 65–99)
Potassium: 4.4 mmol/L (ref 3.5–5.3)
Sodium: 141 mmol/L (ref 135–146)
Total Bilirubin: 1 mg/dL (ref 0.2–1.2)
Total Protein: 7.3 g/dL (ref 6.1–8.1)
eGFR: 36 mL/min/{1.73_m2} — ABNORMAL LOW (ref >=60)

## 2022-04-28 LAB — CBC WITH DIFFERENTIAL/PLATELET
Absolute Monocytes: 485 cells/uL (ref 200–950)
Basophils Absolute: 40 cells/uL (ref 0–200)
Basophils Relative: 0.7 %
Eosinophils Absolute: 57 cells/uL (ref 15–500)
Eosinophils Relative: 1 %
HCT: 31.4 % — ABNORMAL LOW (ref 35.0–45.0)
Hemoglobin: 10.1 g/dL — ABNORMAL LOW (ref 11.7–15.5)
Lymphs Abs: 1402 cells/uL (ref 850–3900)
MCH: 27 pg (ref 27.0–33.0)
MCHC: 32.2 g/dL (ref 32.0–36.0)
MCV: 84 fL (ref 80.0–100.0)
MPV: 12 fL (ref 7.5–12.5)
Monocytes Relative: 8.5 %
Neutro Abs: 3716 cells/uL (ref 1500–7800)
Neutrophils Relative %: 65.2 %
Platelets: 160 10*3/uL (ref 140–400)
RBC: 3.74 10*6/uL — ABNORMAL LOW (ref 3.80–5.10)
RDW: 15.8 % — ABNORMAL HIGH (ref 11.0–15.0)
Total Lymphocyte: 24.6 %
WBC: 5.7 10*3/uL (ref 3.8–10.8)

## 2022-04-28 LAB — HEMOGLOBIN A1C
Hgb A1c MFr Bld: 6.2 % of total Hgb — ABNORMAL HIGH (ref <5.7)
Mean Plasma Glucose: 131 mg/dL
eAG (mmol/L): 7.3 mmol/L

## 2022-04-28 LAB — TSH: TSH: 2.99 mIU/L (ref 0.40–4.50)

## 2022-04-30 NOTE — Progress Notes (Signed)
Hgb 10.1, improved from 8.1 (taken 09/12/21) -  hgbA1C 6.2, up from 5.5, continue Metformin and check CBGs daily and log, bring log to office on next appointment. -  tsh normal -  CCMP is same as previous

## 2022-05-10 ENCOUNTER — Ambulatory Visit: Payer: Medicare Other | Attending: Cardiology

## 2022-05-10 DIAGNOSIS — Z7901 Long term (current) use of anticoagulants: Secondary | ICD-10-CM | POA: Diagnosis not present

## 2022-05-10 DIAGNOSIS — H401131 Primary open-angle glaucoma, bilateral, mild stage: Secondary | ICD-10-CM | POA: Diagnosis not present

## 2022-05-10 DIAGNOSIS — Z952 Presence of prosthetic heart valve: Secondary | ICD-10-CM | POA: Diagnosis not present

## 2022-05-10 DIAGNOSIS — Z5181 Encounter for therapeutic drug level monitoring: Secondary | ICD-10-CM | POA: Diagnosis not present

## 2022-05-10 LAB — POCT INR: INR: 2.6 (ref 2.0–3.0)

## 2022-05-10 NOTE — Patient Instructions (Signed)
Continue taking 1 tablet daily except for 1/2 tablet on Tuesday, Thursday and Saturday. Repeat INR in 6 weeks. [phone]

## 2022-05-11 NOTE — Addendum Note (Signed)
Addended by: Durenda Age C on: 05/11/2022 11:55 AM   Modules accepted: Orders

## 2022-05-14 ENCOUNTER — Ambulatory Visit (INDEPENDENT_AMBULATORY_CARE_PROVIDER_SITE_OTHER): Payer: Medicare Other

## 2022-05-14 ENCOUNTER — Other Ambulatory Visit: Payer: Self-pay | Admitting: *Deleted

## 2022-05-14 ENCOUNTER — Ambulatory Visit (INDEPENDENT_AMBULATORY_CARE_PROVIDER_SITE_OTHER): Payer: Medicare Other | Admitting: Orthopaedic Surgery

## 2022-05-14 DIAGNOSIS — M25562 Pain in left knee: Secondary | ICD-10-CM

## 2022-05-14 DIAGNOSIS — G8929 Other chronic pain: Secondary | ICD-10-CM

## 2022-05-14 DIAGNOSIS — S72402A Unspecified fracture of lower end of left femur, initial encounter for closed fracture: Secondary | ICD-10-CM | POA: Diagnosis not present

## 2022-05-14 DIAGNOSIS — Z952 Presence of prosthetic heart valve: Secondary | ICD-10-CM

## 2022-05-14 MED ORDER — WARFARIN SODIUM 3 MG PO TABS: ORAL_TABLET | ORAL | 0 refills | Status: DC

## 2022-05-14 NOTE — Telephone Encounter (Signed)
Last INR 05/10/2022  Last OV 03/23/2022

## 2022-05-14 NOTE — Progress Notes (Signed)
Chief Complaint: Left leg pain and weakness     History of Present Illness:    Abigail Oliver is a 76 y.o. female presents today status post left distal femoral open reduction internal fixation in April 2022 by Dr. Doreatha Martin.  She is here today as she has had persistent inability to advance beyond the walker.  She endorses some pain in the left knee.  Although she is also endorsing shoulder and elbow pain as well.    Surgical History:   None  PMH/PSH/Family History/Social History/Meds/Allergies:    Past Medical History:  Diagnosis Date   Anemia    Anxiety    Aortic stenosis    a. Now has St Jude mechanical aortic valve (6/00). b. Echo (6/15) with EF 60-65%, mechanical aortic valve with mean gradient 27 mmHg.   Arthritis    a. Possible c-spine arthritis with pain down left arm.     Carotid artery disease (HCC)    a. Carotid dopplers (7/16) with 40-59% BICA stenosis.     Chronic angle-closure glaucoma(365.23)    Chronic diastolic CHF (congestive heart failure) ([place])    a.  RHC (7/15) with mean RA 2, PA 22/11, mean PCWP 9, CI 3.79.   Contrast media allergy    Coronary atherosclerosis of native coronary artery    a. CABG at time of AVR in 6/00 with RIMA-RCA. b. Abnl nuc 11/2013 -> LHC (7/15) with patent RIMA-RCA, 80% mRCA, 80% pLAD with FFR 0.73, treated with DES to pLAD.   Depression    Essential hypertension    GERD (gastroesophageal reflux disease)    Hyperlipidemia    Low back pain    Neuromuscular disorder (HCC)    neuropathy   Peripheral vascular disease (HCC)    a, H/o Right SFA stent. b. Peripheral arterial dopplers (7/16) with right SFA stent patent.     PONV (postoperative nausea and vomiting)    N&V   Type II diabetes mellitus ([place])    Past Surgical History:  Procedure Laterality Date   BILATERAL OOPHORECTOMY  05/24/1985   BREAST BIOPSY  05/24/2010   left   CARDIAC CATHETERIZATION  01/14/1999   normal LV function, severe  aortic stenosis; 80% and 70% stenosis in RCA; mild 20% distal norrowing in L main with 20% proximal LAD stenosis, 40% diagonal stenosis and 20% proximal circumflex stenosis   CARDIAC VALVE REPLACEMENT  05/24/1998   aortic valve    CARDIOVASCULAR STRESS TEST  08/25/2011   R/L MV - EF 72%; no scintigraphic evidence of inducible MI; normal perfusionTID of 1.25 elevated - could indicate small vessle subendocardial ischemia; EKG NSR at 66, non diagnostic for ischemia   CARPAL TUNNEL RELEASE  05/25/1987   bilaterally   CATARACT EXTRACTION W/ INTRAOCULAR LENS  IMPLANT, BILATERAL  ~ 2007   [place]; 1971   CHOLECYSTECTOMY  05/24/1988   COLONOSCOPY N/A 10/27/2012   Procedure: COLONOSCOPY;  Surgeon: Beryle Beams, MD;  Location: [facility];  Service: Endoscopy;  Laterality: N/A;   CORONARY ARTERY BYPASS GRAFT  05/24/1998   RIMA to RCA   CORONARY/GRAFT ANGIOGRAPHY N/A 09/07/2021   Procedure: CORONARY/GRAFT ANGIOGRAPHY;  Surgeon: Early Osmond, MD;  Location: [place] [facility];  Service: Cardiovascular;  Laterality: N/A;   DOPPLER ECHOCARDIOGRAPHY  03/29/2012   EF >55%; mild concentric LVH;  stage 1 diastolic dysfunction, elevated LV filling pressure, dilated LA; MAC mild MR; St Jude AVR peak and mean gradients of 70mHg and 227mg; transvalvular gradients have increased (prev 23 and 14 respectively)   ESOPHAGOGASTRODUODENOSCOPY N/A 10/27/2012   Procedure: ESOPHAGOGASTRODUODENOSCOPY (EGD);  Surgeon: PaBeryle BeamsMD;  [facility];  Service: Endoscopy;  Laterality: N/A;   FEMORAL ARTERY STENT  09/20/2011   6 x 40 Smart Nitinol self-expanding stent placed;  10/15/2031 -R SFA stent open and patent w/o evidence of restenosis   FRACTIONAL FLOW RESERVE WIRE  12/14/2013   Procedure: FRACTIONAL FLOW RESERVE WIRE;  Surgeon: DaLarey DresserMD;  [facility];  Service: Cardiovascular;;   KNEE SURGERY Left    LACERATION REPAIR     right hand   LEFT AND RIGHT HEART  CATHETERIZATION WITH CORONARY ANGIOGRAM N/A 12/14/2013   Procedure: LEFT AND RIGHT HEART CATHETERIZATION WITH CORONARY ANGIOGRAM;  Surgeon: DaLarey DresserMD;  [facility];  Service: Cardiovascular;  Laterality: N/A;   LOWER EXTREMITY ANGIOGRAM N/A 09/20/2011   Procedure: LOWER EXTREMITY ANGIOGRAM;  Surgeon: JoLorretta HarpMD;  [facility];  Service: Cardiovascular;  Laterality: N/A;   LYMPH NODE BIOPSY  06/25/2011   "core needle on 5"   needle biopsy  05/25/2007   "on ankles for nerve damage"   NEUROPLASTY / TRANSPOSITION ULNAR NERVE AT ELBOW     right   ORIF FEMUR FRACTURE Left 09/09/2021   Procedure: OPEN REDUCTION INTERNAL FIXATION (ORIF) DISTAL FEMUR FRACTURE;  Surgeon: HaShona NeedlesMD;  [facility];  Laterality: Left;   PERCUTANEOUS CORONARY STENT INTERVENTION (PCI-S)  12/14/2013   Procedure: PERCUTANEOUS CORONARY STENT INTERVENTION (PCI-S);  Surgeon: DaLarey DresserMD;  [facility];  Service: Cardiovascular;;  Prox LAD 3.00x12 Promus DES    PERIPHERAL ARTERIAL STENT GRAFT  09/20/2011   right SFA   REFRACTIVE SURGERY  ` 2004   "for glaucoma"   TONSILLECTOMY  05/24/1966   TRIGGER FINGER RELEASE Left 12/04/2015   Procedure: LEFT RING FINGER TRIGGER RELEASE;  Surgeon: KeLeanora CoverMD;  [facility]: [facility];  Laterality: Left;   VAGINAL HYSTERECTOMY  05/25/1983   Fibroids   Social History   Socioeconomic History   Marital status: Married    Spouse name: JoBroadus John Number of children: 2   Years of education: 14   Highest education level: Not on file  Occupational History    Comment: hair dresser  Tobacco Use   Smoking status: Never   Smokeless tobacco: Never  Vaping Use   Vaping Use: Never used  Substance and Sexual Activity   Alcohol use: Not Currently   Drug use: Not Currently   Sexual activity: Never  Other Topics Concern   Not on file  Social History Narrative    Pt is a high school graduate with 2 years of college. Married in 1967 she has 1 son born [id number] 1 daughter born 1959nd 1 grandchild. 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      Caffeine use- 2-3 /day, soda, tea       Lives with husband one story home   Social Determinants of Health   Financial Resource Strain: Not on file  Food Insecurity: Not on file  Transportation Needs: Not on file  Physical Activity: Not on file  Stress: Not on file  Social Connections: Not on file  Family History  Problem Relation Age of Onset   Colon cancer Mother    COPD Mother    Emphysema Mother    Cancer Maternal Grandmother    Cancer Maternal Grandfather    Heart disease Brother    Diabetes Neg Hx    Allergies  Allergen Reactions   Atorvastatin     Muscle pain   Dapagliflozin     Other reaction(s): nausea   Gabapentin     Other reaction(s): stomach issues   Simvastatin Other (See Comments)    Muscle pain   Sulfa Antibiotics     unknown   Iodinated Contrast Media Rash     Red rash after cardiac cath 1 wk ago, ? Contrast allergy, requires 13 hr prep now per [email or web address]     Current Outpatient Medications  Medication Sig Dispense Refill   Ascorbic Acid (VITAMIN C) 1000 MG tablet Take 1,000 mg by mouth daily.     aspirin EC 81 MG tablet Take 81 mg by mouth every evening.     BIOTIN PO Take 5,000 mg by mouth daily.     Capsaicin-Menthol (SALONPAS GEL-PATCH HOT EX) Apply 1 patch topically daily as needed (pain).     Cholecalciferol (VITAMIN D3 PO) Take 1,000 Units by mouth daily.     cyanocobalamin (VITAMIN B12) 1000 MCG tablet Take 1,000 mcg by mouth daily.     ferrous sulfate 325 (65 FE) MG tablet Take 1 tablet (325 mg total) by mouth daily. 30 tablet 0   folic acid (FOLVITE) 147 MCG tablet Take 800 mcg by mouth daily.     latanoprost (XALATAN) 0.005 % ophthalmic solution PLACE 1 DROP INTO BOTH EYES AT BEDTIME. (Patient taking differently: Place 1  drop into both eyes at bedtime.) 2.5 mL 2   metFORMIN (GLUCOPHAGE) 1000 MG tablet Take 500 mg by mouth daily with breakfast. (Patient not taking: Reported on 04/26/2022)     metoprolol succinate (TOPROL-XL) 25 MG 24 hr tablet Take 3 tablets (75 mg total) by mouth daily. Take with or immediately following a meal. (Patient taking differently: Take 50 mg by mouth daily. Take with or immediately following a meal.) 270 tablet 3   pantoprazole (PROTONIX) 40 MG tablet TAKE ONE TABLET BY MOUTH DAILY 90 tablet 0   potassium chloride SA (KLOR-CON M) 20 MEQ tablet Take 1 tablet (20 mEq total) by mouth 3 (three) times daily. Please keep appointment (Patient taking differently: Take 20 mEq by mouth 2 (two) times daily. Please keep appointment) 30 tablet 5   rosuvastatin (CRESTOR) 10 MG tablet Take 10 mg by mouth every evening.     sertraline (ZOLOFT) 25 MG tablet TAKE ONE TABLET BY MOUTH DAILY 60 tablet 2   torsemide (DEMADEX) 20 MG tablet Take 2 tablets (40 mg total) by mouth daily. 60 tablet 6   traZODone (DESYREL) 50 MG tablet TAKE 1/2 TO 1 TABLET BY MOUTH EVERY NIGHT AT BEDTIME AS NEEDED FOR SLEEP (Patient taking differently: Take 100 mg by mouth at bedtime.) 30 tablet 3   warfarin (COUMADIN) 3 MG tablet TAKE 1/2 TO 1 TABLET BY MOUTH DAILY AS DIRECTED 100 tablet 0   No current facility-administered medications for this visit.   No results found.  Review of Systems:   A ROS was performed including pertinent positives and negatives as documented in the HPI.  Physical Exam :   Constitutional: NAD and appears stated age Neurological: Alert and oriented Psych: Appropriate affect and cooperative There were no vitals taken for this visit.  Comprehensive Musculoskeletal Exam:    Mild tenderness palpation about the distal femur.  Range of motion of the left knee is from 0 to 120 degrees.  Previous incision is well-healed.  Imaging:   Xray (2 views left femur, 4 views left knee): Status post open  reduction internal fixation left distal femur which shows subsequent healing    I personally reviewed and interpreted the radiographs.   Assessment:   76 y.o. female with left distal femoral open reduction internal fixation.  I did describe that overall her rehab process is consistent with significant deconditioning.  At this point I do believe that she would significantly benefit from hip and core as well as quad strengthening in order to improve her gait status and reduce her dependence on her walker.  I will plan to provide her an external therapy referral.  Plan :    -Return to clinic as needed     I personally saw and evaluated the patient, and participated in the management and treatment plan.  Vanetta Mulders, MD Attending Physician, Orthopedic Surgery  This document was dictated using Dragon voice recognition software. A reasonable attempt at proof reading has been made to minimize errors.

## 2022-05-19 DIAGNOSIS — M25562 Pain in left knee: Secondary | ICD-10-CM | POA: Diagnosis not present

## 2022-05-22 ENCOUNTER — Other Ambulatory Visit: Payer: Self-pay | Admitting: Medical

## 2022-05-27 ENCOUNTER — Ambulatory Visit (INDEPENDENT_AMBULATORY_CARE_PROVIDER_SITE_OTHER): Payer: Medicare Other | Admitting: Adult Health

## 2022-05-27 ENCOUNTER — Encounter: Payer: Self-pay | Admitting: Adult Health

## 2022-05-27 VITALS — BP 116/70 | HR 58 | Temp 97.7°F | Ht 62.0 in | Wt 166.8 lb

## 2022-05-27 DIAGNOSIS — I5032 Chronic diastolic (congestive) heart failure: Secondary | ICD-10-CM

## 2022-05-27 DIAGNOSIS — L309 Dermatitis, unspecified: Secondary | ICD-10-CM | POA: Diagnosis not present

## 2022-05-27 DIAGNOSIS — E1143 Type 2 diabetes mellitus with diabetic autonomic (poly)neuropathy: Secondary | ICD-10-CM

## 2022-05-27 DIAGNOSIS — Z952 Presence of prosthetic heart valve: Secondary | ICD-10-CM

## 2022-05-27 DIAGNOSIS — I10 Essential (primary) hypertension: Secondary | ICD-10-CM

## 2022-05-27 DIAGNOSIS — D509 Iron deficiency anemia, unspecified: Secondary | ICD-10-CM

## 2022-05-27 DIAGNOSIS — N1832 Chronic kidney disease, stage 3b: Secondary | ICD-10-CM | POA: Diagnosis not present

## 2022-05-27 DIAGNOSIS — F339 Major depressive disorder, recurrent, unspecified: Secondary | ICD-10-CM

## 2022-05-27 MED ORDER — TRIAMCINOLONE ACETONIDE 0.1 % EX CREA: 1.0000 | TOPICAL_CREAM | Freq: Two times a day (BID) | CUTANEOUS | 0 refills | Status: AC

## 2022-05-27 NOTE — Progress Notes (Signed)
[facility] clinic  Provider: Durenda Age DNP  Code Status: Full Code  Goals of Care:     04/26/2022   12:21 PM  Advanced Directives  Does Patient Have a Medical Advance Directive? Yes  Type of Paramedic of [place];Living will;Out of facility DNR (pink MOST or yellow form)  Does patient want to make changes to medical advance directive? No - Patient declined     Chief Complaint  Patient presents with   Medical Management of Chronic Issues    Patient presents today for 1 month follow-up    HPI: Patient is a 77 y.o. female seen today for medical management of chronic issues. She was accompanied today by her husband  Iron deficiency anemia, unspecified iron deficiency anemia type -  hgb 10.1, takes FeSO4  Essential hypertension -  BP 116/70, takes metoprolol succinate  Chronic diastolic heart failure (HCC) -  no SOB, takes Demadex  Major depression, recurrent, chronic (HCC) -takes sertraline  Type II diabetes mellitus with peripheral autonomic neuropathy (HCC) -takes metformin  History of aortic valve replacement -  on Coumadin, latest INR 3.2, therapeutic, takes Coumadin   Past Medical History:  Diagnosis Date   Anemia    Anxiety    Aortic stenosis    a. Now has St Jude mechanical aortic valve (6/00). b. Echo (6/15) with EF 60-65%, mechanical aortic valve with mean gradient 27 mmHg.   Arthritis    a. Possible c-spine arthritis with pain down left arm.     Carotid artery disease (HCC)    a. Carotid dopplers (7/16) with 40-59% BICA stenosis.     Chronic angle-closure glaucoma(365.23)    Chronic diastolic CHF (congestive heart failure) ([place])    a.  RHC (7/15) with mean RA 2, PA 22/11, mean PCWP 9, CI 3.79.   Contrast media allergy    Coronary atherosclerosis of native coronary artery    a. CABG at time of AVR in 6/00 with RIMA-RCA. b. Abnl nuc 11/2013 -> LHC (7/15) with patent RIMA-RCA, 80% mRCA, 80% pLAD with FFR 0.73, treated with DES to  pLAD.   Depression    Essential hypertension    GERD (gastroesophageal reflux disease)    Hyperlipidemia    Low back pain    Neuromuscular disorder (HCC)    neuropathy   Peripheral vascular disease (HCC)    a, H/o Right SFA stent. b. Peripheral arterial dopplers (7/16) with right SFA stent patent.     PONV (postoperative nausea and vomiting)    N&V   Type II diabetes mellitus ([place])     Past Surgical History:  Procedure Laterality Date   BILATERAL OOPHORECTOMY  05/24/1985   BREAST BIOPSY  05/24/2010   left   CARDIAC CATHETERIZATION  01/14/1999   normal LV function, severe aortic stenosis; 80% and 70% stenosis in RCA; mild 20% distal norrowing in L main with 20% proximal LAD stenosis, 40% diagonal stenosis and 20% proximal circumflex stenosis   CARDIAC VALVE REPLACEMENT  05/24/1998   aortic valve    CARDIOVASCULAR STRESS TEST  08/25/2011   R/L MV - EF 72%; no scintigraphic evidence of inducible MI; normal perfusionTID of 1.25 elevated - could indicate small vessle subendocardial ischemia; EKG NSR at 66, non diagnostic for ischemia   CARPAL TUNNEL RELEASE  05/25/1987   bilaterally   CATARACT EXTRACTION W/ INTRAOCULAR LENS  IMPLANT, BILATERAL  ~ 2007   [place]; 1971   CHOLECYSTECTOMY  05/24/1988   COLONOSCOPY N/A 10/27/2012  Procedure: COLONOSCOPY;  Surgeon: Beryle Beams, MD;  Location: [facility];  Service: Endoscopy;  Laterality: N/A;   CORONARY ARTERY BYPASS GRAFT  05/24/1998   RIMA to RCA   CORONARY/GRAFT ANGIOGRAPHY N/A 09/07/2021   Procedure: CORONARY/GRAFT ANGIOGRAPHY;  Surgeon: Early Osmond, MD;  Location: [place] [facility];  Service: Cardiovascular;  Laterality: N/A;   DOPPLER ECHOCARDIOGRAPHY  03/29/2012   EF >55%; mild concentric LVH; stage 1 diastolic dysfunction, elevated LV filling pressure, dilated LA; MAC mild MR; St Jude AVR peak and mean gradients of 81mHg and 296mg; transvalvular gradients have increased (prev 23 and 14 respectively)    ESOPHAGOGASTRODUODENOSCOPY N/A 10/27/2012   Procedure: ESOPHAGOGASTRODUODENOSCOPY (EGD);  Surgeon: PaBeryle BeamsMD;  [facility];  Service: Endoscopy;  Laterality: N/A;   FEMORAL ARTERY STENT  09/20/2011   6 x 40 Smart Nitinol self-expanding stent placed;  10/15/2031 -R SFA stent open and patent w/o evidence of restenosis   FRACTIONAL FLOW RESERVE WIRE  12/14/2013   Procedure: FRACTIONAL FLOW RESERVE WIRE;  Surgeon: DaLarey DresserMD;  [facility];  Service: Cardiovascular;;   KNEE SURGERY Left    LACERATION REPAIR     right hand   LEFT AND RIGHT HEART CATHETERIZATION WITH CORONARY ANGIOGRAM N/A 12/14/2013   Procedure: LEFT AND RIGHT HEART CATHETERIZATION WITH CORONARY ANGIOGRAM;  Surgeon: DaLarey DresserMD;  [facility];  Service: Cardiovascular;  Laterality: N/A;   LOWER EXTREMITY ANGIOGRAM N/A 09/20/2011   Procedure: LOWER EXTREMITY ANGIOGRAM;  Surgeon: JoLorretta HarpMD;  [facility];  Service: Cardiovascular;  Laterality: N/A;   LYMPH NODE BIOPSY  06/25/2011   "core needle on 5"   needle biopsy  05/25/2007   "on ankles for nerve damage"   NEUROPLASTY / TRANSPOSITION ULNAR NERVE AT ELBOW     right   ORIF FEMUR FRACTURE Left 09/09/2021   Procedure: OPEN REDUCTION INTERNAL FIXATION (ORIF) DISTAL FEMUR FRACTURE;  Surgeon: HaShona NeedlesMD;  [facility];  Laterality: Left;   PERCUTANEOUS CORONARY STENT INTERVENTION (PCI-S)  12/14/2013   Procedure: PERCUTANEOUS CORONARY STENT INTERVENTION (PCI-S);  Surgeon: DaLarey DresserMD;  [facility];  Service: Cardiovascular;;  Prox LAD 3.00x12 Promus DES    PERIPHERAL ARTERIAL STENT GRAFT  09/20/2011   right SFA   REFRACTIVE SURGERY  ` 2004   "for glaucoma"   TONSILLECTOMY  05/24/1966   TRIGGER FINGER RELEASE Left 12/04/2015   Procedure: LEFT RING FINGER TRIGGER RELEASE;  Surgeon: KeLeanora CoverMD;  [facility];   Laterality: Left;   VAGINAL HYSTERECTOMY  05/25/1983   Fibroids    Allergies  Allergen Reactions   Atorvastatin     Muscle pain   Dapagliflozin     Other reaction(s): nausea   Gabapentin     Other reaction(s): stomach issues   Simvastatin Other (See Comments)    Muscle pain   Sulfa Antibiotics     unknown   Iodinated Contrast Media Rash     Red rash after cardiac cath 1 wk ago, ? Contrast allergy, requires 13 hr prep now per [email or web address]      Outpatient Encounter Medications as of 05/27/2022  Medication Sig   Ascorbic Acid (VITAMIN C) 1000 MG tablet Take 1,000 mg by mouth daily.   aspirin EC 81 MG tablet Take 81 mg by mouth every evening.   BIOTIN PO Take 5,000 mg by mouth daily.   Capsaicin-Menthol (  SALONPAS GEL-PATCH HOT EX) Apply 1 patch topically daily as needed (pain).   Cholecalciferol (VITAMIN D3 PO) Take 1,000 Units by mouth daily.   cyanocobalamin (VITAMIN B12) 1000 MCG tablet Take 1,000 mcg by mouth daily.   ferrous sulfate 325 (65 FE) MG tablet Take 1 tablet (325 mg total) by mouth daily.   folic acid (FOLVITE) 846 MCG tablet Take 800 mcg by mouth daily.   latanoprost (XALATAN) 0.005 % ophthalmic solution PLACE 1 DROP INTO BOTH EYES AT BEDTIME. (Patient taking differently: Place 1 drop into both eyes at bedtime.)   metFORMIN (GLUCOPHAGE) 500 MG tablet Take 500 mg by mouth daily.   metoprolol succinate (TOPROL-XL) 25 MG 24 hr tablet Take 50 mg by mouth daily.   pantoprazole (PROTONIX) 40 MG tablet TAKE ONE TABLET BY MOUTH DAILY   potassium chloride SA (KLOR-CON M) 20 MEQ tablet Take 1 tablet (20 mEq total) by mouth 3 (three) times daily. Please keep appointment (Patient taking differently: Take 20 mEq by mouth 2 (two) times daily. Please keep appointment)   rosuvastatin (CRESTOR) 10 MG tablet Take 10 mg by mouth every evening.   sertraline (ZOLOFT) 25 MG tablet TAKE 1 TABLET BY MOUTH DAILY   torsemide (DEMADEX) 20 MG tablet Take 2 tablets (40 mg total) by  mouth daily.   traZODone (DESYREL) 50 MG tablet TAKE 1/2 TO 1 TABLET BY MOUTH EVERY NIGHT AT BEDTIME AS NEEDED FOR SLEEP   warfarin (COUMADIN) 3 MG tablet TAKE 1/2 TO 1 TABLET BY MOUTH DAILY AS DIRECTED   [DISCONTINUED] metFORMIN (GLUCOPHAGE) 1000 MG tablet Take 500 mg by mouth daily with breakfast.   [DISCONTINUED] metoprolol succinate (TOPROL-XL) 25 MG 24 hr tablet Take 3 tablets (75 mg total) by mouth daily. Take with or immediately following a meal. (Patient taking differently: Take 50 mg by mouth daily. Take with or immediately following a meal.)   No facility-administered encounter medications on file as of 05/27/2022.    Review of Systems:  Review of Systems  Constitutional:  Negative for appetite change, chills, fatigue and fever.  HENT:  Negative for congestion, hearing loss, rhinorrhea and sore throat.   Eyes: Negative.   Respiratory:  Negative for cough, shortness of breath and wheezing.   Cardiovascular:  Negative for chest pain, palpitations and leg swelling.  Gastrointestinal:  Negative for abdominal pain, constipation, diarrhea, nausea and vomiting.  Genitourinary:  Negative for dysuria.  Musculoskeletal:  Negative for arthralgias, back pain and myalgias.  Skin:  Negative for color change, rash and wound.  Neurological:  Negative for dizziness, weakness and headaches.  Psychiatric/Behavioral:  Negative for behavioral problems. The patient is not nervous/anxious.     Health Maintenance  Topic Date Due   Medicare Annual Wellness (AWV)  Never done   Zoster Vaccines- Shingrix (1 of 2) Never done   Pneumonia Vaccine 3+ Years old (1 - PCV) Never done   FOOT EXAM  02/19/2015   Diabetic kidney evaluation - Urine ACR  03/10/2018   OPHTHALMOLOGY EXAM  04/04/2019   INFLUENZA VACCINE  12/22/2021   COVID-19 Vaccine (4 - 2023-24 season) 01/22/2022   HEMOGLOBIN A1C  10/27/2022   Diabetic kidney evaluation - eGFR measurement  04/28/2023   DTaP/Tdap/Td (3 - Td or Tdap) 03/16/2026    DEXA SCAN  Completed   Hepatitis C Screening  Completed   HPV VACCINES  Aged Out   COLONOSCOPY (Pts 45-30yr Insurance coverage will need to be confirmed)  Discontinued    Physical Exam: Vitals:   05/27/22 06599  BP: 116/70  Pulse: (!) 58  Temp: 97.7 F (36.5 C)  SpO2: 98%  Weight: 166 lb 12.8 oz (75.7 kg)  Height: _0  (1.575 m)   Body mass index is 30.51 kg/m. Physical Exam Constitutional:      Appearance: She is obese.  HENT:     Head: Normocephalic and atraumatic.     Nose: Nose normal.     Mouth/Throat:     Mouth: Mucous membranes are moist.  Eyes:     Conjunctiva/sclera: Conjunctivae normal.  Cardiovascular:     Rate and Rhythm: Normal rate and regular rhythm.  Pulmonary:     Effort: Pulmonary effort is normal.     Breath sounds: Normal breath sounds.  Abdominal:     General: Bowel sounds are normal.     Palpations: Abdomen is soft.  Musculoskeletal:        General: Normal range of motion.     Cervical back: Normal range of motion.  Skin:    General: Skin is warm and dry.     Findings: Rash present.     Comments: Rashes on bilateral shins  Neurological:     General: No focal deficit present.     Mental Status: She is alert and oriented to person, place, and time.  Psychiatric:        Mood and Affect: Mood normal.        Behavior: Behavior normal.        Thought Content: Thought content normal.        Judgment: Judgment normal.     Labs reviewed: Basic Metabolic Panel: Recent Labs    09/06/21 0514 09/07/21 1136 09/08/21 0448 09/09/21 0712 09/11/21 0656 09/12/21 0318 04/27/22 0959  NA 136   < > 134*   < > 137 141 141  K 4.0   < > 4.0   < > 3.9 3.8 4.4  CL 105   < > 102   < > 107 108 102  CO2 24   < > 19*   < > _1 GLUCOSE 126*   < > 149*   < > 104* 128* 162*  BUN 26*   < > 44*   < > 38* 33* 32*  CREATININE 1.71*   < > 1.69*   < > 1.76* 1.65* 1.50*  CALCIUM 8.8*   < > 9.0   < > 8.6* 8.7* 9.5  MG 1.9  --  2.3  --   --   --   --   PHOS  3.4  --   --   --   --   --   --   TSH  --   --   --   --   --   --  2.99   < > = values in this interval not displayed.   Liver Function Tests: Recent Labs    04/27/22 0959  AST 18  ALT 9  BILITOT 1.0  PROT 7.3   No results for input(s): "LIPASE", "AMYLASE" in the last 8760 hours. No results for input(s): "AMMONIA" in the last 8760 hours. CBC: Recent Labs    09/11/21 0656 09/12/21 0318 04/27/22 0959  WBC 6.2 6.7 5.7  NEUTROABS  --   --  3,716  HGB 7.4* 8.1* 10.1*  HCT 23.4* 26.7* 31.4*  MCV 88.0 89.3 84.0  PLT 134* 164 160   Lipid Panel: No results for input(s): "CHOL", "HDL", "LDLCALC", "TRIG", "CHOLHDL", "LDLDIRECT" in the last 8760 hours. Lab  Results  Component Value Date   HGBA1C 6.2 (H) 04/27/2022    Procedures since last visit: DG FEMUR MIN 2 VIEWS LEFT  Result Date: 05/17/2022 CLINICAL DATA:  Previous internal fixation of fracture of distal left femur EXAM: LEFT FEMUR 2 VIEWS COMPARISON:  09/09/2021 FINDINGS: There is previous internal fixation of comminuted fracture of distal left femur with metallic plate and multiple screws. Distal portions of left femur are evaluated in the images of the left knee. No recent fracture is seen. There is no dislocation in the left hip. Scattered arterial calcifications are seen. IMPRESSION: Previous internal fixation of fracture of distal left femur. Fracture lines are not visualized in the current study. No recent fracture or dislocation is seen. Electronically Signed   By: Elmer Picker M.D.   On: 05/17/2022 15:00   DG Knee Complete 4 Views Left  Result Date: 05/17/2022 CLINICAL DATA:  Pain, previous internal fixation of fracture of left femur EXAM: LEFT KNEE - COMPLETE 4+ VIEW COMPARISON:  09/09/2021 FINDINGS: There is previous internal fixation of fracture of distal left femur with metallic sideplate and multiple screws. Fracture line is not distinctly visualized in the current study. No recent fracture or dislocation is  seen. There is no significant effusion. IMPRESSION: Previous internal fixation in left femur. No recent fracture or dislocation is seen. Electronically Signed   By: Elmer Picker M.D.   On: 05/17/2022 14:58    Assessment/Plan  1. Iron deficiency anemia, unspecified iron deficiency anemia type Lab Results  Component Value Date   WBC 5.7 04/27/2022   HGB 10.1 (L) 04/27/2022   HCT 31.4 (L) 04/27/2022   MCV 84.0 04/27/2022   PLT 160 04/27/2022   -  stable -  continue FeSO4  2. Stage 3b chronic kidney disease (HCC) Lab Results  Component Value Date   NA 141 04/27/2022   K 4.4 04/27/2022   CO2 30 04/27/2022   GLUCOSE 162 (H) 04/27/2022   BUN 32 (H) 04/27/2022   CREATININE 1.50 (H) 04/27/2022   CALCIUM 9.5 04/27/2022   EGFR 36 (L) 04/27/2022   GFRNONAA 32 (L) 09/12/2021   -  stable -  follows up with nephrology  3. Essential hypertension -  BP 116/70, stable - metoprolol succinate (TOPROL-XL) 25 MG 24 hr tablet; Take 50 mg by mouth daily.  4. Chronic diastolic heart failure (HCC) -  no SOB, stable  5. Eczema, unspecified type - triamcinolone cream (KENALOG) 0.1 %; Apply 1 Application topically 2 (two) times daily for 14 days.  Dispense: 28 g; Refill: 0  6. Major depression, recurrent, chronic (HCC) -  mood is stable, continue  7. Type II diabetes mellitus with peripheral autonomic neuropathy (HCC) -  check CBGs and log and bring to next appointment - metFORMIN (GLUCOPHAGE) 500 MG tablet; Take 500 mg by mouth daily.  8. History of aortic valve replacement -  on Coumadin, INR 3.2 -  INR goal  2.5 to 3.5 -  checks [facility]R at [facility]   Labs/tests ordered:  None  Next appt:  3 months

## 2022-05-27 NOTE — Patient Instructions (Signed)

## 2022-05-28 DIAGNOSIS — M6281 Muscle weakness (generalized): Secondary | ICD-10-CM | POA: Diagnosis not present

## 2022-05-28 DIAGNOSIS — R2681 Unsteadiness on feet: Secondary | ICD-10-CM | POA: Diagnosis not present

## 2022-05-28 DIAGNOSIS — R293 Abnormal posture: Secondary | ICD-10-CM | POA: Diagnosis not present

## 2022-05-31 DIAGNOSIS — M6281 Muscle weakness (generalized): Secondary | ICD-10-CM | POA: Diagnosis not present

## 2022-05-31 DIAGNOSIS — R2681 Unsteadiness on feet: Secondary | ICD-10-CM | POA: Diagnosis not present

## 2022-05-31 DIAGNOSIS — R293 Abnormal posture: Secondary | ICD-10-CM | POA: Diagnosis not present

## 2022-06-09 DIAGNOSIS — M6281 Muscle weakness (generalized): Secondary | ICD-10-CM | POA: Diagnosis not present

## 2022-06-09 DIAGNOSIS — R293 Abnormal posture: Secondary | ICD-10-CM | POA: Diagnosis not present

## 2022-06-09 DIAGNOSIS — R2681 Unsteadiness on feet: Secondary | ICD-10-CM | POA: Diagnosis not present

## 2022-06-11 DIAGNOSIS — R2681 Unsteadiness on feet: Secondary | ICD-10-CM | POA: Diagnosis not present

## 2022-06-11 DIAGNOSIS — M6281 Muscle weakness (generalized): Secondary | ICD-10-CM | POA: Diagnosis not present

## 2022-06-11 DIAGNOSIS — R293 Abnormal posture: Secondary | ICD-10-CM | POA: Diagnosis not present

## 2022-06-16 DIAGNOSIS — R2681 Unsteadiness on feet: Secondary | ICD-10-CM | POA: Diagnosis not present

## 2022-06-16 DIAGNOSIS — M6281 Muscle weakness (generalized): Secondary | ICD-10-CM | POA: Diagnosis not present

## 2022-06-16 DIAGNOSIS — R293 Abnormal posture: Secondary | ICD-10-CM | POA: Diagnosis not present

## 2022-06-17 DIAGNOSIS — E1140 Type 2 diabetes mellitus with diabetic neuropathy, unspecified: Secondary | ICD-10-CM | POA: Diagnosis not present

## 2022-06-17 DIAGNOSIS — M810 Age-related osteoporosis without current pathological fracture: Secondary | ICD-10-CM | POA: Diagnosis not present

## 2022-06-17 DIAGNOSIS — N1832 Chronic kidney disease, stage 3b: Secondary | ICD-10-CM | POA: Diagnosis not present

## 2022-06-21 ENCOUNTER — Ambulatory Visit: Payer: Medicare Other | Attending: Cardiovascular Disease

## 2022-06-21 DIAGNOSIS — Z7901 Long term (current) use of anticoagulants: Secondary | ICD-10-CM | POA: Diagnosis not present

## 2022-06-21 DIAGNOSIS — Z952 Presence of prosthetic heart valve: Secondary | ICD-10-CM

## 2022-06-21 LAB — POCT INR: INR: 2.4 (ref 2.0–3.0)

## 2022-06-21 NOTE — Patient Instructions (Signed)
Description   Take 1.5 tablets today and then continue taking 1 tablet daily except for 1/2 tablet on Tuesday, Thursday and Saturday.  Repeat INR in 5 weeks.  Coumadin Clinic [phone]

## 2022-06-24 DIAGNOSIS — R2681 Unsteadiness on feet: Secondary | ICD-10-CM | POA: Diagnosis not present

## 2022-06-24 DIAGNOSIS — R293 Abnormal posture: Secondary | ICD-10-CM | POA: Diagnosis not present

## 2022-06-24 DIAGNOSIS — M6281 Muscle weakness (generalized): Secondary | ICD-10-CM | POA: Diagnosis not present

## 2022-06-25 DIAGNOSIS — R293 Abnormal posture: Secondary | ICD-10-CM | POA: Diagnosis not present

## 2022-06-25 DIAGNOSIS — R2681 Unsteadiness on feet: Secondary | ICD-10-CM | POA: Diagnosis not present

## 2022-06-25 DIAGNOSIS — M25562 Pain in left knee: Secondary | ICD-10-CM | POA: Diagnosis not present

## 2022-06-25 DIAGNOSIS — M6281 Muscle weakness (generalized): Secondary | ICD-10-CM | POA: Diagnosis not present

## 2022-06-29 DIAGNOSIS — M25562 Pain in left knee: Secondary | ICD-10-CM | POA: Diagnosis not present

## 2022-06-29 DIAGNOSIS — M6281 Muscle weakness (generalized): Secondary | ICD-10-CM | POA: Diagnosis not present

## 2022-06-29 DIAGNOSIS — R293 Abnormal posture: Secondary | ICD-10-CM | POA: Diagnosis not present

## 2022-06-29 DIAGNOSIS — R2681 Unsteadiness on feet: Secondary | ICD-10-CM | POA: Diagnosis not present

## 2022-06-30 ENCOUNTER — Other Ambulatory Visit: Payer: Self-pay

## 2022-06-30 MED ORDER — POTASSIUM CHLORIDE CRYS ER 20 MEQ PO TBCR: 20.0000 meq | EXTENDED_RELEASE_TABLET | Freq: Two times a day (BID) | ORAL | 3 refills | Status: DC

## 2022-06-30 NOTE — Telephone Encounter (Signed)
Pharmacy requested a refill on Potassium 20 meq 3 times a day. Per pts chart , pt is taking Potassium 20 meq twice a day. Called pt to verify how she is taking and she stated that she is taking it twice a day. Will send in prescription for Potassium 20 meq twice a day

## 2022-07-01 DIAGNOSIS — M25562 Pain in left knee: Secondary | ICD-10-CM | POA: Diagnosis not present

## 2022-07-01 DIAGNOSIS — M6281 Muscle weakness (generalized): Secondary | ICD-10-CM | POA: Diagnosis not present

## 2022-07-01 DIAGNOSIS — R2681 Unsteadiness on feet: Secondary | ICD-10-CM | POA: Diagnosis not present

## 2022-07-01 DIAGNOSIS — R293 Abnormal posture: Secondary | ICD-10-CM | POA: Diagnosis not present

## 2022-07-03 ENCOUNTER — Other Ambulatory Visit: Payer: Self-pay | Admitting: Medical

## 2022-07-07 DIAGNOSIS — M6281 Muscle weakness (generalized): Secondary | ICD-10-CM | POA: Diagnosis not present

## 2022-07-07 DIAGNOSIS — R2681 Unsteadiness on feet: Secondary | ICD-10-CM | POA: Diagnosis not present

## 2022-07-07 DIAGNOSIS — M25562 Pain in left knee: Secondary | ICD-10-CM | POA: Diagnosis not present

## 2022-07-07 DIAGNOSIS — R293 Abnormal posture: Secondary | ICD-10-CM | POA: Diagnosis not present

## 2022-07-08 DIAGNOSIS — M1712 Unilateral primary osteoarthritis, left knee: Secondary | ICD-10-CM | POA: Diagnosis not present

## 2022-07-09 DIAGNOSIS — R293 Abnormal posture: Secondary | ICD-10-CM | POA: Diagnosis not present

## 2022-07-09 DIAGNOSIS — M6281 Muscle weakness (generalized): Secondary | ICD-10-CM | POA: Diagnosis not present

## 2022-07-09 DIAGNOSIS — R2681 Unsteadiness on feet: Secondary | ICD-10-CM | POA: Diagnosis not present

## 2022-07-09 DIAGNOSIS — M25562 Pain in left knee: Secondary | ICD-10-CM | POA: Diagnosis not present

## 2022-07-14 DIAGNOSIS — M6281 Muscle weakness (generalized): Secondary | ICD-10-CM | POA: Diagnosis not present

## 2022-07-14 DIAGNOSIS — R2681 Unsteadiness on feet: Secondary | ICD-10-CM | POA: Diagnosis not present

## 2022-07-14 DIAGNOSIS — R293 Abnormal posture: Secondary | ICD-10-CM | POA: Diagnosis not present

## 2022-07-14 DIAGNOSIS — M25562 Pain in left knee: Secondary | ICD-10-CM | POA: Diagnosis not present

## 2022-07-21 DIAGNOSIS — R293 Abnormal posture: Secondary | ICD-10-CM | POA: Diagnosis not present

## 2022-07-21 DIAGNOSIS — M25562 Pain in left knee: Secondary | ICD-10-CM | POA: Diagnosis not present

## 2022-07-21 DIAGNOSIS — M6281 Muscle weakness (generalized): Secondary | ICD-10-CM | POA: Diagnosis not present

## 2022-07-21 DIAGNOSIS — R2681 Unsteadiness on feet: Secondary | ICD-10-CM | POA: Diagnosis not present

## 2022-07-23 DIAGNOSIS — M25562 Pain in left knee: Secondary | ICD-10-CM | POA: Diagnosis not present

## 2022-07-23 DIAGNOSIS — R2681 Unsteadiness on feet: Secondary | ICD-10-CM | POA: Diagnosis not present

## 2022-07-23 DIAGNOSIS — R293 Abnormal posture: Secondary | ICD-10-CM | POA: Diagnosis not present

## 2022-07-23 DIAGNOSIS — M6281 Muscle weakness (generalized): Secondary | ICD-10-CM | POA: Diagnosis not present

## 2022-07-26 ENCOUNTER — Ambulatory Visit: Payer: Medicare Other | Attending: Cardiovascular Disease | Admitting: *Deleted

## 2022-07-26 DIAGNOSIS — Z952 Presence of prosthetic heart valve: Secondary | ICD-10-CM | POA: Diagnosis not present

## 2022-07-26 DIAGNOSIS — Z7901 Long term (current) use of anticoagulants: Secondary | ICD-10-CM | POA: Diagnosis not present

## 2022-07-26 LAB — POCT INR: INR: 2 (ref 2.0–3.0)

## 2022-07-26 NOTE — Patient Instructions (Signed)
Description   Take 1.5 tablets today and then START taking 1 tablet daily except for 1/2 tablet on Tuesday and Saturday. Repeat INR in 4 weeks.  Coumadin Clinic [phone]

## 2022-07-28 DIAGNOSIS — F331 Major depressive disorder, recurrent, moderate: Secondary | ICD-10-CM | POA: Diagnosis not present

## 2022-07-28 DIAGNOSIS — R2681 Unsteadiness on feet: Secondary | ICD-10-CM | POA: Diagnosis not present

## 2022-07-28 DIAGNOSIS — F410 Panic disorder [episodic paroxysmal anxiety] without agoraphobia: Secondary | ICD-10-CM | POA: Diagnosis not present

## 2022-07-28 DIAGNOSIS — R293 Abnormal posture: Secondary | ICD-10-CM | POA: Diagnosis not present

## 2022-07-28 DIAGNOSIS — M6281 Muscle weakness (generalized): Secondary | ICD-10-CM | POA: Diagnosis not present

## 2022-07-28 DIAGNOSIS — M25562 Pain in left knee: Secondary | ICD-10-CM | POA: Diagnosis not present

## 2022-07-30 ENCOUNTER — Encounter: Payer: Medicare Other | Admitting: Family

## 2022-07-30 ENCOUNTER — Telehealth: Payer: Medicare Other | Admitting: Adult Health

## 2022-07-30 DIAGNOSIS — M6281 Muscle weakness (generalized): Secondary | ICD-10-CM | POA: Diagnosis not present

## 2022-07-30 DIAGNOSIS — R2681 Unsteadiness on feet: Secondary | ICD-10-CM | POA: Diagnosis not present

## 2022-07-30 DIAGNOSIS — R293 Abnormal posture: Secondary | ICD-10-CM | POA: Diagnosis not present

## 2022-07-30 DIAGNOSIS — M25562 Pain in left knee: Secondary | ICD-10-CM | POA: Diagnosis not present

## 2022-07-31 ENCOUNTER — Encounter: Payer: Self-pay | Admitting: Adult Health

## 2022-08-02 ENCOUNTER — Encounter: Payer: Medicare Other | Admitting: Family

## 2022-08-05 ENCOUNTER — Encounter: Payer: Self-pay | Admitting: Pulmonary Disease

## 2022-08-05 ENCOUNTER — Telehealth: Payer: Self-pay

## 2022-08-05 ENCOUNTER — Encounter: Payer: Self-pay | Admitting: Adult Health

## 2022-08-05 ENCOUNTER — Ambulatory Visit (INDEPENDENT_AMBULATORY_CARE_PROVIDER_SITE_OTHER): Payer: Medicare Other | Admitting: Adult Health

## 2022-08-05 VITALS — BP 132/88 | HR 63 | Temp 97.6°F | Resp 18 | Ht 62.0 in | Wt 165.5 lb

## 2022-08-05 DIAGNOSIS — I10 Essential (primary) hypertension: Secondary | ICD-10-CM | POA: Diagnosis not present

## 2022-08-05 DIAGNOSIS — F418 Other specified anxiety disorders: Secondary | ICD-10-CM

## 2022-08-05 DIAGNOSIS — J302 Other seasonal allergic rhinitis: Secondary | ICD-10-CM | POA: Diagnosis not present

## 2022-08-05 DIAGNOSIS — F5101 Primary insomnia: Secondary | ICD-10-CM | POA: Diagnosis not present

## 2022-08-05 DIAGNOSIS — Z952 Presence of prosthetic heart valve: Secondary | ICD-10-CM | POA: Diagnosis not present

## 2022-08-05 MED ORDER — FLUTICASONE PROPIONATE 50 MCG/ACT NA SUSP: 2.0000 | Freq: Every day | NASAL | 6 refills | Status: DC

## 2022-08-05 MED ORDER — BENZONATATE 100 MG PO CAPS: 100.0000 mg | ORAL_CAPSULE | Freq: Three times a day (TID) | ORAL | 0 refills | Status: DC | PRN

## 2022-08-05 MED ORDER — CETIRIZINE HCL 10 MG PO TABS: 10.0000 mg | ORAL_TABLET | Freq: Every day | ORAL | 11 refills | Status: DC

## 2022-08-05 NOTE — Patient Instructions (Signed)

## 2022-08-05 NOTE — Progress Notes (Signed)
[facility] clinic  Provider:  Durenda Age DNP  Code Status:  Full Code  Goals of Care:     04/26/2022   12:21 PM  Advanced Directives  Does Patient Have a Medical Advance Directive? Yes  Type of Paramedic of [place];Living will;Out of facility DNR (pink MOST or yellow form)  Does patient want to make changes to medical advance directive? No - Patient declined     Chief Complaint  Patient presents with   Acute Visit    Sinus Pressure    HPI: Patient is a 77 y.o. female seen today for an acute visit for sinus pressure. She was accompanied today by her husband. She was recently prescribed Tamiflu. She took 3 days of Tamiflu and stopped after reading in the internet that she is not supposed to take it while on Coumadin. She takes Coumadin for history of aortic valve replacement.  She complained of coughing with "clear mucus", sneezing and mucus dripping down her throat. She denies fever nor chills.   Past Medical History:  Diagnosis Date   Anemia    Anxiety    Aortic stenosis    a. Now has St Jude mechanical aortic valve (6/00). b. Echo (6/15) with EF 60-65%, mechanical aortic valve with mean gradient 27 mmHg.   Arthritis    a. Possible c-spine arthritis with pain down left arm.     Carotid artery disease (HCC)    a. Carotid dopplers (7/16) with 40-59% BICA stenosis.     Chronic angle-closure glaucoma(365.23)    Chronic diastolic CHF (congestive heart failure) ([place])    a.  RHC (7/15) with mean RA 2, PA 22/11, mean PCWP 9, CI 3.79.   Contrast media allergy    Coronary atherosclerosis of native coronary artery    a. CABG at time of AVR in 6/00 with RIMA-RCA. b. Abnl nuc 11/2013 -> LHC (7/15) with patent RIMA-RCA, 80% mRCA, 80% pLAD with FFR 0.73, treated with DES to pLAD.   Depression    Essential hypertension    GERD (gastroesophageal reflux disease)    Hyperlipidemia    Low back pain    Neuromuscular disorder (HCC)    neuropathy   Peripheral  vascular disease (HCC)    a, H/o Right SFA stent. b. Peripheral arterial dopplers (7/16) with right SFA stent patent.     PONV (postoperative nausea and vomiting)    N&V   Type II diabetes mellitus ([place])     Past Surgical History:  Procedure Laterality Date   BILATERAL OOPHORECTOMY  05/24/1985   BREAST BIOPSY  05/24/2010   left   CARDIAC CATHETERIZATION  01/14/1999   normal LV function, severe aortic stenosis; 80% and 70% stenosis in RCA; mild 20% distal norrowing in L main with 20% proximal LAD stenosis, 40% diagonal stenosis and 20% proximal circumflex stenosis   CARDIAC VALVE REPLACEMENT  05/24/1998   aortic valve    CARDIOVASCULAR STRESS TEST  08/25/2011   R/L MV - EF 72%; no scintigraphic evidence of inducible MI; normal perfusionTID of 1.25 elevated - could indicate small vessle subendocardial ischemia; EKG NSR at 66, non diagnostic for ischemia   CARPAL TUNNEL RELEASE  05/25/1987   bilaterally   CATARACT EXTRACTION W/ INTRAOCULAR LENS  IMPLANT, BILATERAL  ~ 2007   [place]; 1971   CHOLECYSTECTOMY  05/24/1988   COLONOSCOPY N/A 10/27/2012   Procedure: COLONOSCOPY;  Surgeon: Beryle Beams, MD;  Location: [facility];  Service: Endoscopy;  Laterality: N/A;  CORONARY ARTERY BYPASS GRAFT  05/24/1998   RIMA to RCA   CORONARY/GRAFT ANGIOGRAPHY N/A 09/07/2021   Procedure: CORONARY/GRAFT ANGIOGRAPHY;  Surgeon: Early Osmond, MD;  Location: [place] [facility];  Service: Cardiovascular;  Laterality: N/A;   DOPPLER ECHOCARDIOGRAPHY  03/29/2012   EF >55%; mild concentric LVH; stage 1 diastolic dysfunction, elevated LV filling pressure, dilated LA; MAC mild MR; St Jude AVR peak and mean gradients of 3mmHg and 30mmHg; transvalvular gradients have increased (prev 23 and 14 respectively)   ESOPHAGOGASTRODUODENOSCOPY N/A 10/27/2012   Procedure: ESOPHAGOGASTRODUODENOSCOPY (EGD);  Surgeon: Beryle Beams, MD;  Location: [facility];  Service: Endoscopy;  Laterality: N/A;    FEMORAL ARTERY STENT  09/20/2011   6 x 40 Smart Nitinol self-expanding stent placed;  10/15/2031 -R SFA stent open and patent w/o evidence of restenosis   FRACTIONAL FLOW RESERVE WIRE  12/14/2013   Procedure: FRACTIONAL FLOW RESERVE WIRE;  Surgeon: Larey Dresser, MD;  Location: [facility] CATH LAB;  Service: Cardiovascular;;   KNEE SURGERY Left    LACERATION REPAIR     right hand   LEFT AND RIGHT HEART CATHETERIZATION WITH CORONARY ANGIOGRAM N/A 12/14/2013   Procedure: LEFT AND RIGHT HEART CATHETERIZATION WITH CORONARY ANGIOGRAM;  Surgeon: Larey Dresser, MD;  Location: [facility] CATH LAB;  Service: Cardiovascular;  Laterality: N/A;   LOWER EXTREMITY ANGIOGRAM N/A 09/20/2011   Procedure: LOWER EXTREMITY ANGIOGRAM;  Surgeon: Lorretta Harp, MD;  Location: [facility] CATH LAB;  Service: Cardiovascular;  Laterality: N/A;   LYMPH NODE BIOPSY  06/25/2011   "core needle on 5"   needle biopsy  05/25/2007   "on ankles for nerve damage"   NEUROPLASTY / TRANSPOSITION ULNAR NERVE AT ELBOW     right   ORIF FEMUR FRACTURE Left 09/09/2021   Procedure: OPEN REDUCTION INTERNAL FIXATION (ORIF) DISTAL FEMUR FRACTURE;  Surgeon: Shona Needles, MD;  Location: [place];  Service: Orthopedics;  Laterality: Left;   PERCUTANEOUS CORONARY STENT INTERVENTION (PCI-S)  12/14/2013   Procedure: PERCUTANEOUS CORONARY STENT INTERVENTION (PCI-S);  Surgeon: Larey Dresser, MD;  Location: [facility] CATH LAB;  Service: Cardiovascular;;  Prox LAD 3.00x12 Promus DES    PERIPHERAL ARTERIAL STENT GRAFT  09/20/2011   right SFA   REFRACTIVE SURGERY  ` 2004   "for glaucoma"   TONSILLECTOMY  05/24/1966   TRIGGER FINGER RELEASE Left 12/04/2015   Procedure: LEFT RING FINGER TRIGGER RELEASE;  Surgeon: Leanora Cover, MD;  Location: [place];  Service: Orthopedics;  Laterality: Left;   VAGINAL HYSTERECTOMY  05/25/1983   Fibroids    Allergies  Allergen Reactions   Atorvastatin     Muscle pain   Dapagliflozin     Other reaction(s): nausea    Gabapentin     Other reaction(s): stomach issues   Simvastatin Other (See Comments)    Muscle pain   Sulfa Antibiotics     unknown   Iodinated Contrast Media Rash     Red rash after cardiac cath 1 wk ago, ? Contrast allergy, requires 13 hr prep now per [email or web address]      Outpatient Encounter Medications as of 08/05/2022  Medication Sig   Ascorbic Acid (VITAMIN C) 1000 MG tablet Take 1,000 mg by mouth daily.   aspirin EC 81 MG tablet Take 81 mg by mouth every evening.   BIOTIN PO Take 5,000 mg by mouth daily.   Capsaicin-Menthol (SALONPAS GEL-PATCH HOT EX) Apply 1 patch topically daily as needed (pain).   Cholecalciferol (VITAMIN D3 PO) Take 1,000  Units by mouth daily.   cyanocobalamin (VITAMIN B12) 1000 MCG tablet Take 1,000 mcg by mouth daily.   ferrous sulfate 325 (65 FE) MG tablet Take 1 tablet (325 mg total) by mouth daily.   folic acid (FOLVITE) Q000111Q MCG tablet Take 800 mcg by mouth daily.   latanoprost (XALATAN) 0.005 % ophthalmic solution PLACE 1 DROP INTO BOTH EYES AT BEDTIME.   metFORMIN (GLUCOPHAGE) 500 MG tablet Take 500 mg by mouth daily.   metoprolol succinate (TOPROL-XL) 25 MG 24 hr tablet Take 50 mg by mouth daily.   pantoprazole (PROTONIX) 40 MG tablet TAKE ONE TABLET BY MOUTH DAILY   potassium chloride SA (KLOR-CON M) 20 MEQ tablet Take 1 tablet (20 mEq total) by mouth 2 (two) times daily.   rosuvastatin (CRESTOR) 10 MG tablet Take 10 mg by mouth every evening.   sertraline (ZOLOFT) 25 MG tablet TAKE 1 TABLET BY MOUTH DAILY   torsemide (DEMADEX) 20 MG tablet Take 2 tablets (40 mg total) by mouth daily.   traZODone (DESYREL) 50 MG tablet TAKE 1/2 TO 1 TABLET BY MOUTH EVERY NIGHT AT BEDTIME AS NEEDED FOR SLEEP   warfarin (COUMADIN) 3 MG tablet TAKE 1/2 TO 1 TABLET BY MOUTH DAILY AS DIRECTED   No facility-administered encounter medications on file as of 08/05/2022.    Review of Systems:  Review of Systems  Constitutional:  Negative for appetite change,  chills, fatigue and fever.  HENT:  Positive for postnasal drip, sinus pressure, sneezing and sore throat. Negative for hearing loss and rhinorrhea.   Eyes: Negative.   Respiratory:  Positive for cough. Negative for shortness of breath and wheezing.   Cardiovascular:  Negative for chest pain, palpitations and leg swelling.  Gastrointestinal:  Negative for abdominal pain, constipation, diarrhea, nausea and vomiting.  Genitourinary:  Negative for dysuria.  Musculoskeletal:  Negative for arthralgias, back pain and myalgias.  Skin:  Negative for color change, rash and wound.  Neurological:  Negative for dizziness, weakness and headaches.  Psychiatric/Behavioral:  Negative for behavioral problems. The patient is not nervous/anxious.     Health Maintenance  Topic Date Due   Medicare Annual Wellness (AWV)  Never done   Zoster Vaccines- Shingrix (1 of 2) Never done   Pneumonia Vaccine 70+ Years old (1 of 1 - PCV) Never done   FOOT EXAM  02/19/2015   Diabetic kidney evaluation - Urine ACR  03/10/2018   OPHTHALMOLOGY EXAM  04/04/2019   INFLUENZA VACCINE  12/22/2021   COVID-19 Vaccine (4 - 2023-24 season) 01/22/2022   HEMOGLOBIN A1C  10/27/2022   Diabetic kidney evaluation - eGFR measurement  04/28/2023   DTaP/Tdap/Td (4 - Td or Tdap) 03/16/2026   DEXA SCAN  Completed   Hepatitis C Screening  Completed   HPV VACCINES  Aged Out   COLONOSCOPY (Pts 45-1yrs Insurance coverage will need to be confirmed)  Discontinued    Physical Exam: Vitals:   08/05/22 1000  BP: 132/88  Pulse: 63  Resp: 18  Temp: 97.6 F (36.4 C)  SpO2: 98%  Weight: 165 lb 8 oz (75.1 kg)  Height: 5\' 2"  (1.575 m)   Body mass index is 30.27 kg/m. Physical Exam Constitutional:      General: She is not in acute distress.    Appearance: She is obese.  HENT:     Head: Normocephalic and atraumatic.     Nose: Nose normal.     Mouth/Throat:     Mouth: Mucous membranes are moist.  Eyes:  Conjunctiva/sclera:  Conjunctivae normal.  Cardiovascular:     Rate and Rhythm: Normal rate and regular rhythm.  Pulmonary:     Effort: Pulmonary effort is normal.     Breath sounds: Normal breath sounds.  Abdominal:     General: Bowel sounds are normal.     Palpations: Abdomen is soft.  Musculoskeletal:        General: Normal range of motion.     Cervical back: Normal range of motion.  Skin:    General: Skin is warm and dry.  Neurological:     General: No focal deficit present.     Mental Status: She is alert and oriented to person, place, and time.  Psychiatric:        Mood and Affect: Mood normal.        Behavior: Behavior normal.        Thought Content: Thought content normal.        Judgment: Judgment normal.     Labs reviewed: Basic Metabolic Panel: Recent Labs    09/06/21 0514 09/07/21 1136 09/08/21 0448 09/09/21 0712 09/11/21 0656 09/12/21 0318 04/27/22 0959  NA 136   < > 134*   < > 137 141 141  K 4.0   < > 4.0   < > 3.9 3.8 4.4  CL 105   < > 102   < > 107 108 102  CO2 24   < > 19*   < > 22 24 30   GLUCOSE 126*   < > 149*   < > 104* 128* 162*  BUN 26*   < > 44*   < > 38* 33* 32*  CREATININE 1.71*   < > 1.69*   < > 1.76* 1.65* 1.50*  CALCIUM 8.8*   < > 9.0   < > 8.6* 8.7* 9.5  MG 1.9  --  2.3  --   --   --   --   PHOS 3.4  --   --   --   --   --   --   TSH  --   --   --   --   --   --  2.99   < > = values in this interval not displayed.   Liver Function Tests: Recent Labs    04/27/22 0959  AST 18  ALT 9  BILITOT 1.0  PROT 7.3   No results for input(s): "LIPASE", "AMYLASE" in the last 8760 hours. No results for input(s): "AMMONIA" in the last 8760 hours. CBC: Recent Labs    09/11/21 0656 09/12/21 0318 04/27/22 0959  WBC 6.2 6.7 5.7  NEUTROABS  --   --  3,716  HGB 7.4* 8.1* 10.1*  HCT 23.4* 26.7* 31.4*  MCV 88.0 89.3 84.0  PLT 134* 164 160   Lipid Panel: No results for input(s): "CHOL", "HDL", "LDLCALC", "TRIG", "CHOLHDL", "LDLDIRECT" in the last 8760  hours. Lab Results  Component Value Date   HGBA1C 6.2 (H) 04/27/2022    Procedures since last visit: No results found.  Assessment/Plan  1. Seasonal allergies - cetirizine (ZYRTEC) 10 MG tablet; Take 1 tablet (10 mg total) by mouth at bedtime.  Dispense: 30 tablet; Refill: 11 - fluticasone (FLONASE) 50 MCG/ACT nasal spray; Place 2 sprays into both nostrils daily.  Dispense: 16 g; Refill: 6 - benzonatate (TESSALON PERLES) 100 MG capsule; Take 1 capsule (100 mg total) by mouth 3 (three) times daily as needed for cough.  Dispense: 30 capsule; Refill: 0 - POC COVID-19  was negative  2. Primary insomnia -  continue Trazodone  3. Essential hypertension -  BP 132/88, stable -  continue Metoprolol succinate  4. Depression with anxiety -  denies feeling depressed -  continue Zoloft  5. H/O aortic valve replacement -  continue Coumadin     Labs/tests ordered:  POC COVID-19  Next appt:  08/26/2022

## 2022-08-05 NOTE — Telephone Encounter (Signed)
Patient was asked to call back with the name of the provider that had given her Tamiflu. Patient stated the provider's name was Ok Edwards, NP.  Message routed to [facility]

## 2022-08-09 DIAGNOSIS — H401131 Primary open-angle glaucoma, bilateral, mild stage: Secondary | ICD-10-CM | POA: Diagnosis not present

## 2022-08-09 NOTE — Progress Notes (Signed)
This encounter was created in error - please disregard.

## 2022-08-11 LAB — POC COVID19 BINAXNOW: SARS Coronavirus 2 Ag: NEGATIVE

## 2022-08-13 ENCOUNTER — Other Ambulatory Visit: Payer: Self-pay | Admitting: *Deleted

## 2022-08-13 DIAGNOSIS — R2681 Unsteadiness on feet: Secondary | ICD-10-CM | POA: Diagnosis not present

## 2022-08-13 DIAGNOSIS — M25562 Pain in left knee: Secondary | ICD-10-CM | POA: Diagnosis not present

## 2022-08-13 DIAGNOSIS — R293 Abnormal posture: Secondary | ICD-10-CM | POA: Diagnosis not present

## 2022-08-13 DIAGNOSIS — M6281 Muscle weakness (generalized): Secondary | ICD-10-CM | POA: Diagnosis not present

## 2022-08-13 DIAGNOSIS — Z952 Presence of prosthetic heart valve: Secondary | ICD-10-CM

## 2022-08-13 MED ORDER — WARFARIN SODIUM 3 MG PO TABS: ORAL_TABLET | ORAL | 0 refills | Status: DC

## 2022-08-13 NOTE — Telephone Encounter (Signed)
Prescription refill request received for warfarin Lov:  Abigail Oliver 03/23/2022 Next INR check: 4/1 Warfarin tablet strength: 3mg 

## 2022-08-20 DIAGNOSIS — M25562 Pain in left knee: Secondary | ICD-10-CM | POA: Diagnosis not present

## 2022-08-20 DIAGNOSIS — R293 Abnormal posture: Secondary | ICD-10-CM | POA: Diagnosis not present

## 2022-08-20 DIAGNOSIS — M6281 Muscle weakness (generalized): Secondary | ICD-10-CM | POA: Diagnosis not present

## 2022-08-20 DIAGNOSIS — R2681 Unsteadiness on feet: Secondary | ICD-10-CM | POA: Diagnosis not present

## 2022-08-23 ENCOUNTER — Ambulatory Visit: Payer: Medicare Other

## 2022-08-23 DIAGNOSIS — N1832 Chronic kidney disease, stage 3b: Secondary | ICD-10-CM | POA: Diagnosis not present

## 2022-08-24 ENCOUNTER — Ambulatory Visit (HOSPITAL_COMMUNITY): Payer: Medicare Other | Attending: Cardiology | Admitting: *Deleted

## 2022-08-24 DIAGNOSIS — I2510 Atherosclerotic heart disease of native coronary artery without angina pectoris: Secondary | ICD-10-CM | POA: Diagnosis not present

## 2022-08-24 DIAGNOSIS — Z952 Presence of prosthetic heart valve: Secondary | ICD-10-CM | POA: Diagnosis not present

## 2022-08-24 DIAGNOSIS — I5189 Other ill-defined heart diseases: Secondary | ICD-10-CM | POA: Diagnosis not present

## 2022-08-24 DIAGNOSIS — E785 Hyperlipidemia, unspecified: Secondary | ICD-10-CM | POA: Diagnosis not present

## 2022-08-24 DIAGNOSIS — Z7901 Long term (current) use of anticoagulants: Secondary | ICD-10-CM | POA: Insufficient documentation

## 2022-08-24 DIAGNOSIS — I10 Essential (primary) hypertension: Secondary | ICD-10-CM | POA: Diagnosis not present

## 2022-08-24 DIAGNOSIS — I739 Peripheral vascular disease, unspecified: Secondary | ICD-10-CM | POA: Diagnosis not present

## 2022-08-24 LAB — POCT INR: INR: 2 (ref 2.0–3.0)

## 2022-08-24 NOTE — Patient Instructions (Signed)
Description   Take 1 tablet today and then START taking 1 tablet daily except for 1/2 tablet on Saturday. Repeat INR in 4 weeks. [facility]in Clinic [phone]

## 2022-08-25 ENCOUNTER — Encounter: Payer: Self-pay | Admitting: Pulmonary Disease

## 2022-08-25 ENCOUNTER — Ambulatory Visit (HOSPITAL_BASED_OUTPATIENT_CLINIC_OR_DEPARTMENT_OTHER): Payer: Medicare Other

## 2022-08-25 DIAGNOSIS — I10 Essential (primary) hypertension: Secondary | ICD-10-CM

## 2022-08-25 DIAGNOSIS — I5189 Other ill-defined heart diseases: Secondary | ICD-10-CM | POA: Diagnosis not present

## 2022-08-25 DIAGNOSIS — E785 Hyperlipidemia, unspecified: Secondary | ICD-10-CM

## 2022-08-25 DIAGNOSIS — Z952 Presence of prosthetic heart valve: Secondary | ICD-10-CM

## 2022-08-25 DIAGNOSIS — I2510 Atherosclerotic heart disease of native coronary artery without angina pectoris: Secondary | ICD-10-CM | POA: Diagnosis not present

## 2022-08-25 DIAGNOSIS — Z7901 Long term (current) use of anticoagulants: Secondary | ICD-10-CM | POA: Diagnosis not present

## 2022-08-25 DIAGNOSIS — I739 Peripheral vascular disease, unspecified: Secondary | ICD-10-CM

## 2022-08-25 LAB — ECHOCARDIOGRAM COMPLETE
AR max vel: 1.74 cm2
AV Area VTI: 1.69 cm2
AV Area mean vel: 1.69 cm2
AV Mean grad: 15.7 mmHg
AV Peak grad: 29.1 mmHg
Ao pk vel: 2.7 m/s
Area-P 1/2: 3.12 cm2
S' Lateral: 3.1 cm

## 2022-08-26 ENCOUNTER — Ambulatory Visit: Payer: Medicare Other | Admitting: Adult Health

## 2022-09-15 DIAGNOSIS — Z5181 Encounter for therapeutic drug level monitoring: Secondary | ICD-10-CM | POA: Diagnosis not present

## 2022-09-22 ENCOUNTER — Ambulatory Visit: Payer: Medicare Other | Attending: Cardiovascular Disease

## 2022-09-22 DIAGNOSIS — Z952 Presence of prosthetic heart valve: Secondary | ICD-10-CM | POA: Insufficient documentation

## 2022-09-22 DIAGNOSIS — Z7901 Long term (current) use of anticoagulants: Secondary | ICD-10-CM | POA: Insufficient documentation

## 2022-09-22 LAB — POCT INR: INR: 3.4 — AB (ref 2.0–3.0)

## 2022-09-22 NOTE — Patient Instructions (Signed)
Description   Continue taking 1 tablet daily except for 1/2 tablet on Saturday.  Repeat INR in 5 weeks.  [facility] Clinic [phone]

## 2022-10-01 DIAGNOSIS — N1832 Chronic kidney disease, stage 3b: Secondary | ICD-10-CM | POA: Diagnosis not present

## 2022-10-27 ENCOUNTER — Ambulatory Visit: Payer: Medicare Other | Attending: Cardiovascular Disease

## 2022-10-27 DIAGNOSIS — Z952 Presence of prosthetic heart valve: Secondary | ICD-10-CM | POA: Diagnosis not present

## 2022-10-27 DIAGNOSIS — Z7901 Long term (current) use of anticoagulants: Secondary | ICD-10-CM

## 2022-10-27 LAB — POCT INR: INR: 3.7 — AB (ref 2.0–3.0)

## 2022-10-27 NOTE — Patient Instructions (Signed)
Description   Take 1/2 tablet today and then continue taking 1 tablet daily except for 1/2 tablet on Saturday.  Repeat INR in 4 weeks.  [facility] [phone]

## 2022-11-06 ENCOUNTER — Other Ambulatory Visit: Payer: Self-pay | Admitting: Medical

## 2022-11-08 ENCOUNTER — Other Ambulatory Visit: Payer: Self-pay

## 2022-11-08 DIAGNOSIS — Z952 Presence of prosthetic heart valve: Secondary | ICD-10-CM

## 2022-11-08 MED ORDER — WARFARIN SODIUM 3 MG PO TABS: ORAL_TABLET | ORAL | 0 refills | Status: DC

## 2022-11-08 NOTE — Telephone Encounter (Signed)
Prescription refill request received for warfarin Lov: 03/23/22 Allyson Sabal)  Next INR check: 7/3 Warfarin tablet strength: 3mg   Appropriate dose. Refill sent.

## 2022-11-24 ENCOUNTER — Other Ambulatory Visit: Payer: Self-pay | Admitting: Medical

## 2022-11-24 ENCOUNTER — Ambulatory Visit: Payer: Medicare Other | Attending: Cardiovascular Disease | Admitting: *Deleted

## 2022-11-24 DIAGNOSIS — M792 Neuralgia and neuritis, unspecified: Secondary | ICD-10-CM | POA: Diagnosis not present

## 2022-11-24 DIAGNOSIS — Z952 Presence of prosthetic heart valve: Secondary | ICD-10-CM

## 2022-11-24 DIAGNOSIS — Z7901 Long term (current) use of anticoagulants: Secondary | ICD-10-CM | POA: Diagnosis not present

## 2022-11-24 LAB — POCT INR: INR: 2.9 (ref 2.0–3.0)

## 2022-11-24 NOTE — Patient Instructions (Addendum)
Description   Continue taking warfarin 1 tablet daily except for 1/2 tablet on Saturday.  Repeat INR in 5 weeks.  [facility] [phone]

## 2022-11-26 ENCOUNTER — Telehealth: Payer: Self-pay | Admitting: *Deleted

## 2022-11-26 NOTE — Telephone Encounter (Signed)
Dr. Allyson Sabal approved provider change. Routed to scheduling pool.

## 2022-11-26 NOTE — Telephone Encounter (Signed)
Received message from Chip Boer at front desk that patient walked in on 11/24/22 requesting to schedule an appointment. Pt has previously seen Dr. Allyson Sabal, but would like to change providers.  Spoke with patient via phone and confirmed that she does not have a preference who else she sees.  Advised that I will reach out to Dr. Allyson Sabal for approval and then ask a scheduler to contact her. Pt is agreeable to this plan.

## 2022-12-06 DIAGNOSIS — Z1231 Encounter for screening mammogram for malignant neoplasm of breast: Secondary | ICD-10-CM | POA: Diagnosis not present

## 2022-12-07 ENCOUNTER — Encounter: Payer: Self-pay | Admitting: Professional Counselor

## 2022-12-07 ENCOUNTER — Ambulatory Visit (INDEPENDENT_AMBULATORY_CARE_PROVIDER_SITE_OTHER): Payer: Medicare Other | Admitting: Professional Counselor

## 2022-12-07 DIAGNOSIS — F331 Major depressive disorder, recurrent, moderate: Secondary | ICD-10-CM

## 2022-12-07 DIAGNOSIS — F411 Generalized anxiety disorder: Secondary | ICD-10-CM | POA: Diagnosis not present

## 2022-12-07 NOTE — Patient Instructions (Signed)
Utilize community resources as provided as indicated

## 2022-12-07 NOTE — Progress Notes (Signed)
[facility] Counsel[facility] Initial Adult Exam  Name: Abigail Oliver Date: 12/07/2022 MRN: [id number] DOB: June 22, 1945 PCP: [name], NP  Time spent: 12;07p -1:09p  Guardian/Payee:  pt    Paperwork requested:  No   Reason for Visit /Presenting Problem: anxiety, depression  Mental Status Exam:    Appearance:   Neat     Behavior:  Sharing and Motivated  Motor:  Normal  Speech/Language:   Clear and Coherent and Normal Rate  Affect:  Appropriate and Congruent  Mood:  anxious  Thought process:  normal  Thought content:    WNL  Sensory/Perceptual disturbances:    WNL  Orientation:  oriented to person, place, time/date, and situation  Attention:  Good  Concentration:  Good  Memory:  WNL  Fund of knowledge:   Good  Insight:    Good  Judgment:   Good  Impulse Control:  Good   Reported Symptoms:  insomnia, worries, on edge, trouble relaxing, chronic state of overwhelm, anhedonia, feeling powerless, fatigue, somatic concerns  Risk Assessment: Danger to Self:  No Self-injurious Behavior: No Danger to Others: No Duty to Warn:no Physical Aggression / Violence:No  Access to Firearms a concern: No  Gang Involvement:No  Patient / guardian was educated about steps to take if suicide or homicide risk level increases between visits: n/a While future psychiatric events cannot be accurately predicted, the patient does not currently require acute inpatient psychiatric care and does not currently meet [facility] involuntary commitment criteria.  Substance Abuse History: Current substance abuse: No     Past Psychiatric History:   Previous psychological history is significant for anxiety and depression Outpatient Providers: yes History of Psych Hospitalization: No  Psychological Testing:  n/a    Abuse History: Victim of No.,  n/a    Report needed: No. Victim of Neglect:No. Perpetrator of  n/a   Witness / Exposure to Domestic Violence: No   Protective Services  Involvement: No  Witness to [employer] Violence:  No   Family History:  Family History  Problem Relation Age of Onset   Colon cancer Mother    COPD Mother    Emphysema Mother    Cancer Maternal Grandmother    Cancer Maternal Grandfather    Heart disease Brother    Diabetes Neg Hx   Sister and aunt: Bipolar  Living situation: the patient lives with their spouse  Sexual Orientation:  Straight  Relationship Status: married               If a parent, number of children / ages: 42yo dtr, 54yo son  Support Systems; spouse Son, friends  Surveyor, quantity Stress:  No   Income/Employment/Disability: Employment  Financial planner: No   Educational History: Education: some college  Religion/Sprituality/World View:   Protestant  Any cultural differences that may affect / interfere with treatment:  not applicable   Recreation/Hobbies: hairstyling, needle arts, crafts  Stressors:Health problems   Other: family issue    Strengths:  Supportive Relationships, Family, Friends, Warehouse manager, Spirituality, Journalist, newspaper, and Able to [employer], intelligence, resiliency  Barriers:  n/a   Legal History: Pending legal issue / charges: The patient has no significant history of legal issues. History of legal issue / charges:  n/a  Medical History/Surgical History:reviewed Past Medical History:  Diagnosis Date   Anemia    Anxiety    Aortic stenosis    a. Now has St Jude mechanical aortic valve (6/00). b. Echo (6/15) with EF 60-65%, mechanical aortic valve with mean gradient 27  mmHg.   Arthritis    a. Possible c-spine arthritis with pain down left arm.     Carotid artery disease (HCC)    a. Carotid dopplers (7/16) with 40-59% BICA stenosis.     Chronic angle-closure glaucoma(365.23)    Chronic diastolic CHF (congestive heart failure) (HCC)    a.  RHC (7/15) with mean RA 2, PA 22/11, mean PCWP 9, CI 3.79.   Contrast media allergy    Coronary atherosclerosis of native coronary  artery    a. CABG at time of AVR in 6/00 with RIMA-RCA. b. Abnl nuc 11/2013 -> LHC (7/15) with patent RIMA-RCA, 80% mRCA, 80% pLAD with FFR 0.73, treated with DES to pLAD.   Depression    Essential hypertension    GERD (gastroesophageal reflux disease)    Hyperlipidemia    Low back pain    Neuromuscular disorder (HCC)    neuropathy   Peripheral vascular disease (HCC)    a, H/o Right SFA stent. b. Peripheral arterial dopplers (7/16) with right SFA stent patent.     PONV (postoperative nausea and vomiting)    N&V   Type II diabetes mellitus (HCC)     Past Surgical History:  Procedure Laterality Date   BILATERAL OOPHORECTOMY  05/24/1985   BREAST BIOPSY  05/24/2010   left   CARDIAC CATHETERIZATION  01/14/1999   normal LV function, severe aortic stenosis; 80% and 70% stenosis in RCA; mild 20% distal norrowing in L main with 20% proximal LAD stenosis, 40% diagonal stenosis and 20% proximal circumflex stenosis   CARDIAC VALVE REPLACEMENT  05/24/1998   aortic valve    CARDIOVASCULAR STRESS TEST  08/25/2011   R/L MV - EF 72%; no scintigraphic evidence of inducible MI; normal perfusionTID of 1.25 elevated - could indicate small vessle subendocardial ischemia; EKG NSR at 66, non diagnostic for ischemia   CARPAL TUNNEL RELEASE  05/25/1987   bilaterally   CATARACT EXTRACTION W/ INTRAOCULAR LENS  IMPLANT, BILATERAL  ~ 2007   CESAREAN SECTION  1969; 1971   CHOLECYSTECTOMY  05/24/1988   COLONOSCOPY N/A 10/27/2012   Procedure: COLONOSCOPY;  Surgeon: Theda Belfast, MD;  Location: [facility];  Service: Endoscopy;  Laterality: N/A;   CORONARY ARTERY BYPASS GRAFT  05/24/1998   RIMA to RCA   CORONARY/GRAFT ANGIOGRAPHY N/A 09/07/2021   Procedure: CORONARY/GRAFT ANGIOGRAPHY;  Surgeon: Orbie Pyo, MD;  Location: [facility];  Service: Cardiovascular;  Laterality: N/A;   DOPPLER ECHOCARDIOGRAPHY  03/29/2012   EF >55%; mild concentric LVH; stage 1 diastolic dysfunction, elevated LV filling  pressure, dilated LA; MAC mild MR; St Jude AVR peak and mean gradients of and ; transvalvular gradients have increased (prev 23 and 14 respectively)   ESOPHAGOGASTRODUODENOSCOPY N/A 10/27/2012   Procedure: ESOPHAGOGASTRODUODENOSCOPY (EGD);  Surgeon: Theda Belfast, MD;  Location: [facility];  Service: Endoscopy;  Laterality: N/A;   FEMORAL ARTERY STENT  09/20/2011   6 x 40 Smart Nitinol self-expanding stent placed;  10/15/2031 -R SFA stent open and patent w/o evidence of restenosis   FRACTIONAL FLOW RESERVE WIRE  12/14/2013   Procedure: FRACTIONAL FLOW RESERVE WIRE;  Surgeon: Laurey Morale, MD;  Location[facility] CATH LAB;  Service: Cardiovascular;;   KNEE SURGERY Left    LACERATION REPAIR     right hand   LEFT AND RIGHT HEART CATHETERIZATION WITH CORONARY ANGIOGRAM N/A 12/14/2013   Procedure: LEFT AND RIGHT HEART CATHETERIZATION WITH CORONARY ANGIOGRAM;  Surgeon: Laurey Morale, MD;  Location: [facility] CATH LAB;  Service: Cardiovascular;  Laterality: N/A;   LOWER EXTREMITY ANGIOGRAM N/A 09/20/2011   Procedure: LOWER EXTREMITY ANGIOGRAM;  Surgeon: Runell Gess, MD;  Location: [facility] CATH LAB;  Service: Cardiovascular;  Laterality: N/A;   LYMPH NODE BIOPSY  06/25/2011   "core needle on 5"   needle biopsy  05/25/2007   "on ankles for nerve damage"   NEUROPLASTY / TRANSPOSITION ULNAR NERVE AT ELBOW     right   ORIF FEMUR FRACTURE Left 09/09/2021   Procedure: OPEN REDUCTION INTERNAL FIXATION (ORIF) DISTAL FEMUR FRACTURE;  Surgeon: Roby Lofts, MD;  Location: [facility];  Service: Orthopedics;  Laterality: Left;   PERCUTANEOUS CORONARY STENT INTERVENTION (PCI-S)  12/14/2013   Procedure: PERCUTANEOUS CORONARY STENT INTERVENTION (PCI-S);  Surgeon: Laurey Morale, MD;  Location: [facility] CATH LAB;  Service: Cardiovascular;;  Prox LAD 3.00x12 Promus DES    PERIPHERAL ARTERIAL STENT GRAFT  09/20/2011   right SFA   REFRACTIVE SURGERY  ` 2004   "for glaucoma"   TONSILLECTOMY  05/24/1966   TRIGGER  FINGER RELEASE Left 12/04/2015   Procedure: LEFT RING FINGER TRIGGER RELEASE;  Surgeon: Betha Loa, MD;  Location: [facility];  Service: Orthopedics;  Laterality: Left;   VAGINAL HYSTERECTOMY  05/25/1983   Fibroids    Medications: Current Outpatient Medications  Medication Sig Dispense Refill   Ascorbic Acid (VITAMIN C) 1000 MG tablet Take 1,000 mg by mouth daily.     aspirin EC 81 MG tablet Take 81 mg by mouth every evening.     benzonatate (TESSALON PERLES) 100 MG capsule Take 1 capsule (100 mg total) by mouth 3 (three) times daily as needed for cough. 30 capsule 0   BIOTIN PO Take 5,000 mg by mouth daily.     Capsaicin-Menthol (SALONPAS GEL-PATCH HOT EX) Apply 1 patch topically daily as needed (pain).     cetirizine (ZYRTEC) 10 MG tablet Take 1 tablet (10 mg total) by mouth at bedtime. 30 tablet 11   Cholecalciferol (VITAMIN D3 PO) Take 1,000 Units by mouth daily.     cyanocobalamin (VITAMIN B12) 1000 MCG tablet Take 1,000 mcg by mouth daily.     ferrous sulfate 325 (65 FE) MG tablet Take 1 tablet (325 mg total) by mouth daily. 30 tablet 0   fluticasone (FLONASE) 50 MCG/ACT nasal spray Place 2 sprays into both nostrils daily. 16 g 6   folic acid (FOLVITE) 800 MCG tablet Take 800 mcg by mouth daily.     latanoprost (XALATAN) 0.005 % ophthalmic solution PLACE 1 DROP INTO BOTH EYES AT BEDTIME. 2.5 mL 2   metFORMIN (GLUCOPHAGE) 500 MG tablet Take 500 mg by mouth daily.     metoprolol succinate (TOPROL-XL) 25 MG 24 hr tablet Take 50 mg by mouth daily.     pantoprazole (PROTONIX) 40 MG tablet TAKE ONE TABLET BY MOUTH DAILY 90 tablet 0   potassium chloride SA (KLOR-CON M) 20 MEQ tablet Take 1 tablet (20 mEq total) by mouth 2 (two) times daily. 180 tablet 3   rosuvastatin (CRESTOR) 10 MG tablet Take 10 mg by mouth every evening.     sertraline (ZOLOFT) 25 MG tablet TAKE 1 TABLET BY MOUTH DAILY 60 tablet 2   torsemide (DEMADEX) 20 MG tablet Take 2 tablets (40 mg total) by mouth  daily. 60 tablet 6   traZODone (DESYREL) 50 MG tablet TAKE 1/2 TO 1 TABLET BY MOUTH EVERY NIGHT AT BEDTIME AS NEEDED FOR SLEEP 30 tablet 3   warfarin (COUMADIN) 3 MG tablet  TAKE 1/2 TO 1 TABLET BY MOUTH DAILY AS DIRECTED 90 tablet 0   No current facility-administered medications for this visit.    Allergies  Allergen Reactions   Atorvastatin     Muscle pain   Dapagliflozin     Other reaction(s): nausea   Gabapentin     Other reaction(s): stomach issues   Simvastatin Other (See Comments)    Muscle pain   Sulfa Antibiotics     unknown   Iodinated Contrast Media Rash     Red rash after cardiac cath 1 wk ago, ? Contrast allergy, requires 13 hr prep now per [email or web address]      Diagnoses:    ICD-10-CM   1. Generalized anxiety disorder  F41.1     2. Major depressive disorder, recurrent episode, moderate (HCC)  F33.1      Treatment provided: Counselor provided person-centered counseling including active listening, affirmation, resourcing, building of rapport; clinical assessment; facilitation of PHQ9 (14) and GAD7 (20). Pt presented to session with desire to receive support around challenging life circumstance, nurture her sense of empowerment, and to develop coping skills for symptoms of anxiety and depression. Pt processed experience of considerable family stress per adult child for whom she and other family members had an intervention. Pt reported that in spite of efforts she experiences significant stress and overwhelm around meeting the needs and demands of family member. Pt and counselor discussed strategies for self protection in the way of boundary setting. Counselor affirmed pt feelings and experience. Pt reported that she attends Nar-Anon meetings and finds them helpful. Pt identified support persons and her strengths; she reported husband to have dementia and son to be primary support around her concerns.   Plan of Care: Pt to return in one week. Continue to build  rapport, assess symptoms and hx; review and obtain consent for treatment plan.  Gaspar Bidding, [facility]

## 2022-12-08 ENCOUNTER — Telehealth: Payer: Self-pay | Admitting: Cardiovascular Disease

## 2022-12-08 NOTE — Telephone Encounter (Signed)
Pt c/o swelling/edema: STAT if pt has developed SOB within 24 hours  If swelling, where is the swelling located?   Legs and feet  How much weight have you gained and in what time span?  Yes  Have you gained 2 pounds in a day or 5 pounds in a week?   Daughter stated patient weighed 158 about 9 days ago and now weighs 174  Do you have a log of your daily weights (if so, list)?  No  Are you currently taking a fluid pill?   Yes  Are you currently SOB?   A little SOB which started around Monday  Have you traveled recently in a car or plane for an extended period of time?   No  Daughter stated patient is having diarrhea, exhaustion and weakness.  Daughter stated can call patient directly at [phone].

## 2022-12-08 NOTE — Telephone Encounter (Signed)
Called daughter.  LVM to call office

## 2022-12-09 NOTE — Telephone Encounter (Signed)
Called daughter and goes straight to VM. LM to call office. Call to patient.  She states taking gabapentin prescribed by her "foot doctor". She has SOB with increased swelling and weight gain.  She is advised to call podiatrist to see how to taper off med and replacement.   Please advise if any recommendations from cardiology for the edema at this time.

## 2022-12-10 ENCOUNTER — Ambulatory Visit (INDEPENDENT_AMBULATORY_CARE_PROVIDER_SITE_OTHER): Payer: Medicare Other | Admitting: Adult Health

## 2022-12-10 ENCOUNTER — Encounter: Payer: Self-pay | Admitting: Adult Health

## 2022-12-10 VITALS — BP 128/78 | HR 60 | Temp 97.4°F | Resp 18 | Ht 62.0 in | Wt 176.0 lb

## 2022-12-10 DIAGNOSIS — E1143 Type 2 diabetes mellitus with diabetic autonomic (poly)neuropathy: Secondary | ICD-10-CM

## 2022-12-10 DIAGNOSIS — F5101 Primary insomnia: Secondary | ICD-10-CM

## 2022-12-10 DIAGNOSIS — I5032 Chronic diastolic (congestive) heart failure: Secondary | ICD-10-CM | POA: Diagnosis not present

## 2022-12-10 DIAGNOSIS — Z952 Presence of prosthetic heart valve: Secondary | ICD-10-CM

## 2022-12-10 DIAGNOSIS — F339 Major depressive disorder, recurrent, unspecified: Secondary | ICD-10-CM | POA: Diagnosis not present

## 2022-12-10 DIAGNOSIS — N1832 Chronic kidney disease, stage 3b: Secondary | ICD-10-CM

## 2022-12-10 DIAGNOSIS — I10 Essential (primary) hypertension: Secondary | ICD-10-CM

## 2022-12-10 MED ORDER — METOLAZONE 2.5 MG PO TABS
2.5000 mg | ORAL_TABLET | Freq: Every day | ORAL | 0 refills | Status: AC
Start: 2022-12-10 — End: 2022-12-13

## 2022-12-10 MED ORDER — HYDROXYZINE PAMOATE 25 MG PO CAPS: 25.0000 mg | ORAL_CAPSULE | Freq: Every evening | ORAL | 3 refills | Status: DC | PRN

## 2022-12-10 MED ORDER — SERTRALINE HCL 50 MG PO TABS: 50.0000 mg | ORAL_TABLET | Freq: Every day | ORAL | 3 refills | Status: DC

## 2022-12-10 NOTE — Progress Notes (Unsigned)
[facility] clinic  Provider:   Code Status: *** Goals of Care:     12/10/2022    1:29 PM  Advanced Directives  Does Patient Have a Medical Advance Directive? No  Would patient like information on creating a medical advance directive? No - Patient declined     Chief Complaint  Patient presents with   Acute Visit    Retaining fluid has gained 14 lbs in 9 days     HPI: Patient is a 77 y.o. Oliver seen today for an acute visit for  Past Medical History:  Diagnosis Date   Anemia    Anxiety    Aortic stenosis    a. Now has St Jude mechanical aortic valve (6/00). b. Echo (6/15) with EF 60-65%, mechanical aortic valve with mean gradient 27 mmHg.   Arthritis    a. Possible c-spine arthritis with pain down left arm.     Carotid artery disease (HCC)    a. Carotid dopplers (7/16) with 40-59% BICA stenosis.     Chronic angle-closure glaucoma(365.23)    Chronic diastolic CHF (congestive heart failure) (HCC)    a.  RHC (7/15) with mean RA 2, PA 22/11, mean PCWP 9, CI 3.79.   Contrast media allergy    Coronary atherosclerosis of native coronary artery    a. CABG at time of AVR in 6/00 with RIMA-RCA. b. Abnl nuc 11/2013 -> LHC (7/15) with patent RIMA-RCA, 80% mRCA, 80% pLAD with FFR 0.73, treated with DES to pLAD.   Depression    Essential hypertension    GERD (gastroesophageal reflux disease)    Hyperlipidemia    Low back pain    Neuromuscular disorder (HCC)    neuropathy   Peripheral vascular disease (HCC)    a, H/o Right SFA stent. b. Peripheral arterial dopplers (7/16) with right SFA stent patent.     PONV (postoperative nausea and vomiting)    N&V   Type II diabetes mellitus (HCC)     Past Surgical History:  Procedure Laterality Date   BILATERAL OOPHORECTOMY  05/24/1985   BREAST BIOPSY  05/24/2010   left   CARDIAC CATHETERIZATION  01/14/1999   normal LV function, severe aortic stenosis; 80% and 70% stenosis in RCA; mild 20% distal norrowing in L main with 20% proximal LAD  stenosis, 40% diagonal stenosis and 20% proximal circumflex stenosis   CARDIAC VALVE REPLACEMENT  05/24/1998   aortic valve    CARDIOVASCULAR STRESS TEST  08/25/2011   R/L MV - EF 72%; no scintigraphic evidence of inducible MI; normal perfusionTID of 1.25 elevated - could indicate small vessle subendocardial ischemia; EKG NSR at 66, non diagnostic for ischemia   CARPAL TUNNEL RELEASE  05/25/1987   bilaterally   CATARACT EXTRACTION W/ INTRAOCULAR LENS  IMPLANT, BILATERAL  ~ 2007   CESAREAN SECTION  1969; 1971   CHOLECYSTECTOMY  05/24/1988   COLONOSCOPY N/A 10/27/2012   Procedure: COLONOSCOPY;  Surgeon: Theda Belfast, MD;  Location: [facility];  Service: Endoscopy;  Laterality: N/A;   CORONARY ARTERY BYPASS GRAFT  05/24/1998   RIMA to RCA   CORONARY/GRAFT ANGIOGRAPHY N/A 09/07/2021   Procedure: CORONARY/GRAFT ANGIOGRAPHY;  Surgeon: Orbie Pyo, MD;  Location: [facility];  Service: Cardiovascular;  Laterality: N/A;   DOPPLER ECHOCARDIOGRAPHY  03/29/2012   EF >55%; mild concentric LVH; stage 1 diastolic dysfunction, elevated LV filling pressure, dilated LA; MAC mild MR; St Jude AVR peak and mean gradients of and ; transvalvular gradients have increased (prev 23  and 14 respectively)   ESOPHAGOGASTRODUODENOSCOPY N/A 10/27/2012   Procedure: ESOPHAGOGASTRODUODENOSCOPY (EGD);  Surgeon: Theda Belfast, MD;  Location: [facility];  Service: Endoscopy;  Laterality: N/A;   FEMORAL ARTERY STENT  09/20/2011   6 x 40 Smart Nitinol self-expanding stent placed;  10/15/2031 -R SFA stent open and patent w/o evidence of restenosis   FRACTIONAL FLOW RESERVE WIRE  12/14/2013   Procedure: FRACTIONAL FLOW RESERVE WIRE;  Surgeon: Laurey Morale, MD;  Location: [facility] CATH LAB;  Service: Cardiovascular;;   KNEE SURGERY Left    LACERATION REPAIR     right hand   LEFT AND RIGHT HEART CATHETERIZATION WITH CORONARY ANGIOGRAM N/A 12/14/2013   Procedure: LEFT AND RIGHT HEART CATHETERIZATION WITH  CORONARY ANGIOGRAM;  Surgeon: Laurey Morale, MD;  Location: [facility] CATH LAB;  Service: Cardiovascular;  Laterality: N/A;   LOWER EXTREMITY ANGIOGRAM N/A 09/20/2011   Procedure: LOWER EXTREMITY ANGIOGRAM;  Surgeon: Runell Gess, MD;  Location: [facility] CATH LAB;  Service: Cardiovascular;  Laterality: N/A;   LYMPH NODE BIOPSY  06/25/2011   "core needle on 5"   needle biopsy  05/25/2007   "on ankles for nerve damage"   NEUROPLASTY / TRANSPOSITION ULNAR NERVE AT ELBOW     right   ORIF FEMUR FRACTURE Left 09/09/2021   Procedure: OPEN REDUCTION INTERNAL FIXATION (ORIF) DISTAL FEMUR FRACTURE;  Surgeon: Roby Lofts, MD;  Location: [facility];  Service: Orthopedics;  Laterality: Left;   PERCUTANEOUS CORONARY STENT INTERVENTION (PCI-S)  12/14/2013   Procedure: PERCUTANEOUS CORONARY STENT INTERVENTION (PCI-S);  Surgeon: Laurey Morale, MD;  Location: [facility] CATH LAB;  Service: Cardiovascular;;  Prox LAD 3.00x12 Promus DES    PERIPHERAL ARTERIAL STENT GRAFT  09/20/2011   right SFA   REFRACTIVE SURGERY  ` 2004   "for glaucoma"   TONSILLECTOMY  05/24/1966   TRIGGER FINGER RELEASE Left 12/04/2015   Procedure: LEFT RING FINGER TRIGGER RELEASE;  Surgeon: Betha Loa, MD;  Location: [facility];  Service: Orthopedics;  Laterality: Left;   VAGINAL HYSTERECTOMY  05/25/1983   Fibroids    Allergies  Allergen Reactions   Atorvastatin     Muscle pain   Dapagliflozin     Other reaction(s): nausea   Gabapentin     Other reaction(s): stomach issues   Simvastatin Other (See Comments)    Muscle pain   Sulfa Antibiotics     unknown   Iodinated Contrast Media Rash     Red rash after cardiac cath 1 wk ago, ? Contrast allergy, requires 13 hr prep now per [email or web address]      Outpatient Encounter Medications as of 12/10/2022  Medication Sig   Ascorbic Acid (VITAMIN C) 1000 MG tablet Take 1,000 mg by mouth daily.   aspirin EC 81 MG tablet Take 81 mg by mouth every evening.   BIOTIN PO Take  5,000 mg by mouth daily.   cetirizine (ZYRTEC) 10 MG tablet Take 1 tablet (10 mg total) by mouth at bedtime.   Cholecalciferol (VITAMIN D3 PO) Take 1,000 Units by mouth daily.   cyanocobalamin (VITAMIN B12) 1000 MCG tablet Take 1,000 mcg by mouth daily.   fluticasone (FLONASE) 50 MCG/ACT nasal spray Place 2 sprays into both nostrils daily.   folic acid (FOLVITE) 800 MCG tablet Take 800 mcg by mouth daily.   latanoprost (XALATAN) 0.005 % ophthalmic solution PLACE 1 DROP INTO BOTH EYES AT BEDTIME.   metFORMIN (GLUCOPHAGE) 500 MG tablet Take 500 mg by mouth daily.   metoprolol succinate (TOPROL-XL)  25 MG 24 hr tablet Take 50 mg by mouth daily.   pantoprazole (PROTONIX) 40 MG tablet TAKE ONE TABLET BY MOUTH DAILY   potassium chloride SA (KLOR-CON M) 20 MEQ tablet Take 1 tablet (20 mEq total) by mouth 2 (two) times daily.   rosuvastatin (CRESTOR) 10 MG tablet Take 10 mg by mouth every evening.   sertraline (ZOLOFT) 25 MG tablet TAKE 1 TABLET BY MOUTH DAILY   torsemide (DEMADEX) 20 MG tablet Take 2 tablets (40 mg total) by mouth daily.   traZODone (DESYREL) 50 MG tablet TAKE 1/2 TO 1 TABLET BY MOUTH EVERY NIGHT AT BEDTIME AS NEEDED FOR SLEEP   warfarin (COUMADIN) 3 MG tablet TAKE 1/2 TO 1 TABLET BY MOUTH DAILY AS DIRECTED   [DISCONTINUED] benzonatate (TESSALON PERLES) 100 MG capsule Take 1 capsule (100 mg total) by mouth 3 (three) times daily as needed for cough.   [DISCONTINUED] Capsaicin-Menthol (SALONPAS GEL-PATCH HOT EX) Apply 1 patch topically daily as needed (pain).   [DISCONTINUED] ferrous sulfate 325 (65 FE) MG tablet Take 1 tablet (325 mg total) by mouth daily.   No facility-administered encounter medications on file as of 12/10/2022.    Review of Systems:  Review of Systems  Constitutional:  Negative for appetite change, chills, fatigue and fever.  HENT:  Negative for congestion, hearing loss, rhinorrhea and sore throat.   Eyes: Negative.   Respiratory:  Negative for cough, shortness  of breath and wheezing.   Cardiovascular:  Negative for chest pain, palpitations and leg swelling.  Gastrointestinal:  Negative for abdominal pain, constipation, diarrhea, nausea and vomiting.  Genitourinary:  Negative for dysuria.  Musculoskeletal:  Negative for arthralgias, back pain and myalgias.  Skin:  Negative for color change, rash and wound.  Neurological:  Negative for dizziness, weakness and headaches.  Psychiatric/Behavioral:  Negative for behavioral problems. The patient is not nervous/anxious.     Health Maintenance  Topic Date Due   Medicare Annual Wellness (AWV)  Never done   Zoster Vaccines- Shingrix (1 of 2) Never done   Pneumonia Vaccine 68+ Years old (1 of 1 - PCV) Never done   FOOT EXAM  02/19/2015   Diabetic kidney evaluation - Urine ACR  03/10/2018   OPHTHALMOLOGY EXAM  04/04/2019   COVID-19 Vaccine (4 - 2023-24 season) 01/22/2022   HEMOGLOBIN A1C  10/27/2022   INFLUENZA VACCINE  12/23/2022   Diabetic kidney evaluation - eGFR measurement  04/28/2023   DTaP/Tdap/Td (4 - Td or Tdap) 03/16/2026   DEXA SCAN  Completed   Hepatitis C Screening  Completed   HPV VACCINES  Aged Out   Colonoscopy  Discontinued    Physical Exam: Vitals:   12/10/22 1329  BP: 128/78  Pulse: 60  Resp: 18  Temp: (!) 97.4 F (36.3 C)  SpO2: 98%  Weight: 176 lb (79.8 kg)  Height: 5\' 2"  (1.575 m)   Body mass index is 32.19 kg/m. Physical Exam Constitutional:      Appearance: She is obese.  HENT:     Head: Normocephalic and atraumatic.     Nose: Nose normal.     Mouth/Throat:     Mouth: Mucous membranes are moist.  Eyes:     Conjunctiva/sclera: Conjunctivae normal.  Cardiovascular:     Rate and Rhythm: Normal rate and regular rhythm.  Pulmonary:     Effort: Pulmonary effort is normal.     Breath sounds: Normal breath sounds.  Abdominal:     General: Bowel sounds are normal.  Palpations: Abdomen is soft.  Musculoskeletal:        General: No swelling. Normal range of  motion.     Cervical back: Normal range of motion.     Right lower leg: Edema present.     Left lower leg: Edema present.     Comments: BLE 1+edema  Skin:    General: Skin is warm and dry.  Neurological:     General: No focal deficit present.     Mental Status: She is alert and oriented to person, place, and time.  Psychiatric:        Mood and Affect: Mood normal.        Behavior: Behavior normal.        Thought Content: Thought content normal.        Judgment: Judgment normal.     Labs reviewed: Basic Metabolic Panel: Recent Labs    04/27/22 0959  NA 141  K 4.4  CL 102  CO2 30  GLUCOSE 162*  BUN 32*  CREATININE 1.50*  CALCIUM 9.5  TSH 2.99   Liver Function Tests: Recent Labs    04/27/22 0959  AST 18  ALT 9  BILITOT 1.0  PROT 7.3   No results for input(s): "LIPASE", "AMYLASE" in the last 8760 hours. No results for input(s): "AMMONIA" in the last 8760 hours. CBC: Recent Labs    04/27/22 0959  WBC 5.7  NEUTROABS 3,716  HGB 10.1*  HCT 31.4*  MCV 84.0  PLT 160   Lipid Panel: No results for input(s): "CHOL", "HDL", "LDLCALC", "TRIG", "CHOLHDL", "LDLDIRECT" in the last 8760 hours. Lab Results  Component Value Date   HGBA1C 6.2 (H) 04/27/2022    Procedures since last visit: No results found.  Assessment/Plan     Labs/tests ordered:    Next appt:  Visit date not found

## 2022-12-11 LAB — BASIC METABOLIC PANEL WITH GFR
BUN/Creatinine Ratio: 20 (calc) (ref 6–22)
BUN: 38 mg/dL — ABNORMAL HIGH (ref 7–25)
CO2: 27 mmol/L (ref 20–32)
Calcium: 8.9 mg/dL (ref 8.6–10.4)
Chloride: 106 mmol/L (ref 98–110)
Creat: 1.87 mg/dL — ABNORMAL HIGH (ref 0.60–1.00)
Glucose, Bld: 145 mg/dL — ABNORMAL HIGH (ref 65–99)
Potassium: 4.3 mmol/L (ref 3.5–5.3)
Sodium: 140 mmol/L (ref 135–146)
eGFR: 27 mL/min/{1.73_m2} — ABNORMAL LOW (ref >=60)

## 2022-12-11 LAB — CBC WITH DIFFERENTIAL/PLATELET
Absolute Monocytes: 490 cells/uL (ref 200–950)
Basophils Absolute: 20 cells/uL (ref 0–200)
Basophils Relative: 0.4 %
Eosinophils Absolute: 122 cells/uL (ref 15–500)
Eosinophils Relative: 2.4 %
HCT: 27.6 % — ABNORMAL LOW (ref 35.0–45.0)
Hemoglobin: 8.8 g/dL — ABNORMAL LOW (ref 11.7–15.5)
Lymphs Abs: 1469 cells/uL (ref 850–3900)
MCH: 28.1 pg (ref 27.0–33.0)
MCHC: 31.9 g/dL — ABNORMAL LOW (ref 32.0–36.0)
MCV: 88.2 fL (ref 80.0–100.0)
MPV: 11.3 fL (ref 7.5–12.5)
Monocytes Relative: 9.6 %
Neutro Abs: 2999 cells/uL (ref 1500–7800)
Neutrophils Relative %: 58.8 %
Platelets: 121 10*3/uL — ABNORMAL LOW (ref 140–400)
RBC: 3.13 10*6/uL — ABNORMAL LOW (ref 3.80–5.10)
RDW: 15.7 % — ABNORMAL HIGH (ref 11.0–15.0)
Total Lymphocyte: 28.8 %
WBC: 5.1 10*3/uL (ref 3.8–10.8)

## 2022-12-11 LAB — BRAIN NATRIURETIC PEPTIDE: Brain Natriuretic Peptide: 141 pg/mL — ABNORMAL HIGH (ref <100)

## 2022-12-12 NOTE — Progress Notes (Incomplete)
[facility] Counsel[facility] Initial Adult Exam  Name: CIARAH PEACE Date: 12/07/2022 MRN: [id number] DOB: [birth year] PCP: [name], NP  Time spent: 12;07p -1:09p  Guardian/Payee:  pt    Paperwork requested:  No   Reason for Visit /Presenting Problem: anxiety, depression  Mental Status Exam:    Appearance:   Neat     Behavior:  Sharing and Motivated  Motor:  Normal  Speech/Language:   Clear and Coherent and Normal Rate  Affect:  Appropriate and Congruent  Mood:  anxious  Thought process:  normal  Thought content:    WNL  Sensory/Perceptual disturbances:    WNL  Orientation:  oriented to person, place, time/date, and situation  Attention:  Good  Concentration:  Good  Memory:  WNL  Fund of knowledge:   Good  Insight:    Good  Judgment:   Good  Impulse Control:  Good   Reported Symptoms:  insomnia, worries, on edge, trouble relaxing, chronic state of overwhelm, anhedonia, feeling powerless, fatigue, somatic concerns  Risk Assessment: Danger to Self:  No Self-injurious Behavior: No Danger to Others: No Duty to Warn:no Physical Aggression / Violence:No  Access to Firearms a concern: No  Gang Involvement:No  Patient / guardian was educated about steps to take if suicide or homicide risk level increases between visits: n/a While future psychiatric events cannot be accurately predicted, the patient does not currently require acute inpatient psychiatric care and does not currently meet [facility] involuntary commitment criteria.  Substance Abuse History: Current substance abuse: No     Past Psychiatric History:   Previous psychological history is significant for anxiety and depression Outpatient Providers: yes History of Psych Hospitalization: No  Psychological Testing:  n/a    Abuse History: Victim of No.,  n/a    Report needed: No. Victim of Neglect:No. Perpetrator of  n/a   Witness / Exposure to Domestic Violence: No   Protective Services  Involvement: No  Witness to [employer] Violence:  No   Family History:  Family History  Problem Relation Age of Onset  . Colon cancer Mother   . COPD Mother   . Emphysema Mother   . Cancer Maternal Grandmother   . Cancer Maternal Grandfather   . Heart disease Brother   . Diabetes Neg Hx   Sister and aunt: Bipolar  Living situation: the patient lives with their spouse  Sexual Orientation:  Straight  Relationship Status: married               If a parent, number of children / ages: 42yo dtr, 28yo son  Support Systems; spouse Son, friends  Surveyor, quantity Stress:  No   Income/Employment/Disability: Employment  Financial planner: No   Educational History: Education: some college  Religion/Sprituality/World View:   Protestant  Any cultural differences that may affect / interfere with treatment:  not applicable   Recreation/Hobbies: hairstyling, needle arts, crafts  Stressors:Health problems   Other: family issue    Strengths:  Supportive Relationships, Family, Friends, Warehouse manager, Spirituality, Journalist, newspaper, and Able to [employer], intelligence, resiliency  Barriers:  n/a   Legal History: Pending legal issue / charges: The patient has no significant history of legal issues. History of legal issue / charges:  n/a  Medical History/Surgical History:reviewed Past Medical History:  Diagnosis Date  . Anemia   . Anxiety   . Aortic stenosis    a. Now has St Jude mechanical aortic valve (6/00). b. Echo (6/15) with EF 60-65%, mechanical aortic valve with mean gradient 27  mmHg.  . Arthritis    a. Possible c-spine arthritis with pain down left arm.    . Carotid artery disease (HCC)    a. Carotid dopplers (7/16) with 40-59% BICA stenosis.    . Chronic angle-closure glaucoma(365.23)   . Chronic diastolic CHF (congestive heart failure) (HCC)    a.  RHC (7/15) with mean RA 2, PA 22/11, mean PCWP 9, CI 3.79.  Marland Kitchen Contrast media allergy   . Coronary atherosclerosis of  native coronary artery    a. CABG at time of AVR in 6/00 with RIMA-RCA. b. Abnl nuc 11/2013 -> LHC (7/15) with patent RIMA-RCA, 80% mRCA, 80% pLAD with FFR 0.73, treated with DES to pLAD.  Marland Kitchen Depression   . Essential hypertension   . GERD (gastroesophageal reflux disease)   . Hyperlipidemia   . Low back pain   . Neuromuscular disorder (HCC)    neuropathy  . Peripheral vascular disease (HCC)    a, H/o Right SFA stent. b. Peripheral arterial dopplers (7/16) with right SFA stent patent.    Marland Kitchen PONV (postoperative nausea and vomiting)    N&V  . Type II diabetes mellitus (HCC)     Past Surgical History:  Procedure Laterality Date  . BILATERAL OOPHORECTOMY  05/24/1985  . BREAST BIOPSY  05/24/2010   left  . CARDIAC CATHETERIZATION  01/14/1999   normal LV function, severe aortic stenosis; 80% and 70% stenosis in RCA; mild 20% distal norrowing in L main with 20% proximal LAD stenosis, 40% diagonal stenosis and 20% proximal circumflex stenosis  . CARDIAC VALVE REPLACEMENT  05/24/1998   aortic valve   . CARDIOVASCULAR STRESS TEST  08/25/2011   R/L MV - EF 72%; no scintigraphic evidence of inducible MI; normal perfusionTID of 1.25 elevated - could indicate small vessle subendocardial ischemia; EKG NSR at 66, non diagnostic for ischemia  . CARPAL TUNNEL RELEASE  05/25/1987   bilaterally  . CATARACT EXTRACTION W/ INTRAOCULAR LENS  IMPLANT, BILATERAL  ~ 2007  . CESAREAN SECTION  1969; 1971  . CHOLECYSTECTOMY  05/24/1988  . COLONOSCOPY N/A 10/27/2012   Procedure: COLONOSCOPY;  Surgeon: Theda Belfast, MD;  Location: [facility];  Service: Endoscopy;  Laterality: N/A;  . CORONARY ARTERY BYPASS GRAFT  05/24/1998   RIMA to RCA  . CORONARY/GRAFT ANGIOGRAPHY N/A 09/07/2021   Procedure: CORONARY/GRAFT ANGIOGRAPHY;  Surgeon: Orbie Pyo, MD;  Location[facility] INVASIVE CV LAB;  Service: Cardiovascular;  Laterality: N/A;  . DOPPLER ECHOCARDIOGRAPHY  03/29/2012   EF >55%; mild concentric LVH; stage 1  diastolic dysfunction, elevated LV filling pressure, dilated LA; MAC mild MR; St Jude AVR peak and mean gradients of and ; transvalvular gradients have increased (prev 23 and 14 respectively)  . ESOPHAGOGASTRODUODENOSCOPY N/A 10/27/2012   Procedure: ESOPHAGOGASTRODUODENOSCOPY (EGD);  Surgeon: Theda Belfast, MD;  Location: [facility];  Service: Endoscopy;  Laterality: N/A;  . FEMORAL ARTERY STENT  09/20/2011   6 x 40 Smart Nitinol self-expanding stent placed;  10/15/2031 -R SFA stent open and patent w/o evidence of restenosis  . FRACTIONAL FLOW RESERVE WIRE  12/14/2013   Procedure: FRACTIONAL FLOW RESERVE WIRE;  Surgeon: Laurey Morale, MD;  Location: [facility] CATH LAB;  Service: Cardiovascular;;  . KNEE SURGERY Left   . LACERATION REPAIR     right hand  . LEFT AND RIGHT HEART CATHETERIZATION WITH CORONARY ANGIOGRAM N/A 12/14/2013   Procedure: LEFT AND RIGHT HEART CATHETERIZATION WITH CORONARY ANGIOGRAM;  Surgeon: Laurey Morale, MD;  Location: [facility] CATH LAB;  Service: Cardiovascular;  Laterality: N/A;  . LOWER EXTREMITY ANGIOGRAM N/A 09/20/2011   Procedure: LOWER EXTREMITY ANGIOGRAM;  Surgeon: Runell Gess, MD;  Location[facility] CATH LAB;  Service: Cardiovascular;  Laterality: N/A;  . LYMPH NODE BIOPSY  06/25/2011   "core needle on 5"  . needle biopsy  05/25/2007   "on ankles for nerve damage"  . NEUROPLASTY / TRANSPOSITION ULNAR NERVE AT ELBOW     right  . ORIF FEMUR FRACTURE Left 09/09/2021   Procedure: OPEN REDUCTION INTERNAL FIXATION (ORIF) DISTAL FEMUR FRACTURE;  Surgeon: Roby Lofts, MD;  Location: [facility] OR;  Service: Orthopedics;  Laterality: Left;  . PERCUTANEOUS CORONARY STENT INTERVENTION (PCI-S)  12/14/2013   Procedure: PERCUTANEOUS CORONARY STENT INTERVENTION (PCI-S);  Surgeon: Laurey Morale, MD;  Location: [facility] CATH LAB;  Service: Cardiovascular;;  Prox LAD 3.00x12 Promus DES   . PERIPHERAL ARTERIAL STENT GRAFT  09/20/2011   right SFA  . REFRACTIVE SURGERY  ` 2004    "for glaucoma"  . TONSILLECTOMY  05/24/1966  . TRIGGER FINGER RELEASE Left 12/04/2015   Procedure: LEFT RING FINGER TRIGGER RELEASE;  Surgeon: Betha Loa, MD;  Location: [facility];  Service: Orthopedics;  Laterality: Left;  Marland Kitchen VAGINAL HYSTERECTOMY  05/25/1983   Fibroids    Medications: Current Outpatient Medications  Medication Sig Dispense Refill  . Ascorbic Acid (VITAMIN C) 1000 MG tablet Take 1,000 mg by mouth daily.    Marland Kitchen aspirin EC 81 MG tablet Take 81 mg by mouth every evening.    . benzonatate (TESSALON PERLES) 100 MG capsule Take 1 capsule (100 mg total) by mouth 3 (three) times daily as needed for cough. 30 capsule 0  . BIOTIN PO Take 5,000 mg by mouth daily.    . Capsaicin-Menthol (SALONPAS GEL-PATCH HOT EX) Apply 1 patch topically daily as needed (pain).    . cetirizine (ZYRTEC) 10 MG tablet Take 1 tablet (10 mg total) by mouth at bedtime. 30 tablet 11  . Cholecalciferol (VITAMIN D3 PO) Take 1,000 Units by mouth daily.    . cyanocobalamin (VITAMIN B12) 1000 MCG tablet Take 1,000 mcg by mouth daily.    . ferrous sulfate 325 (65 FE) MG tablet Take 1 tablet (325 mg total) by mouth daily. 30 tablet 0  . fluticasone (FLONASE) 50 MCG/ACT nasal spray Place 2 sprays into both nostrils daily. 16 g 6  . folic acid (FOLVITE) 800 MCG tablet Take 800 mcg by mouth daily.    Marland Kitchen latanoprost (XALATAN) 0.005 % ophthalmic solution PLACE 1 DROP INTO BOTH EYES AT BEDTIME. 2.5 mL 2  . metFORMIN (GLUCOPHAGE) 500 MG tablet Take 500 mg by mouth daily.    . metoprolol succinate (TOPROL-XL) 25 MG 24 hr tablet Take 50 mg by mouth daily.    . pantoprazole (PROTONIX) 40 MG tablet TAKE ONE TABLET BY MOUTH DAILY 90 tablet 0  . potassium chloride SA (KLOR-CON M) 20 MEQ tablet Take 1 tablet (20 mEq total) by mouth 2 (two) times daily. 180 tablet 3  . rosuvastatin (CRESTOR) 10 MG tablet Take 10 mg by mouth every evening.    . sertraline (ZOLOFT) 25 MG tablet TAKE 1 TABLET BY MOUTH DAILY 60 tablet 2   . torsemide (DEMADEX) 20 MG tablet Take 2 tablets (40 mg total) by mouth daily. 60 tablet 6  . traZODone (DESYREL) 50 MG tablet TAKE 1/2 TO 1 TABLET BY MOUTH EVERY NIGHT AT BEDTIME AS NEEDED FOR SLEEP 30 tablet 3  . warfarin (COUMADIN) 3 MG tablet  TAKE 1/2 TO 1 TABLET BY MOUTH DAILY AS DIRECTED 90 tablet 0   No current facility-administered medications for this visit.    Allergies  Allergen Reactions  . Atorvastatin     Muscle pain  . Dapagliflozin     Other reaction(s): nausea  . Gabapentin     Other reaction(s): stomach issues  . Simvastatin Other (See Comments)    Muscle pain  . Sulfa Antibiotics     unknown  . Iodinated Contrast Media Rash     Red rash after cardiac cath 1 wk ago, ? Contrast allergy, requires 13 hr prep now per [email or web address]      Diagnoses:    ICD-10-CM   1. Generalized anxiety disorder  F41.1     2. Major depressive disorder, recurrent episode, moderate (HCC)  F33.1      Counselor provided person-centered counseling including active listening, affirmation, resourcing, building of rapport; clinical assessment; facilitation of PHQ9 (14) and GAD7 (20). Pt presented to session with desire to receive support around challenging life circumstance and to develop coping skills for symptoms of anxiety and depression. Pt processed experience of considerable family stress per adult child for whom she and other family members had an intervention. Pt reported that in spite of efforts she experiences significant stress and overwhelm around meeting the needs and demands of family member. Pt and counselor discussed strategies for self protection in the way of boundary setting.   Plan of Care: ***   Gaspar Bidding, [facility]

## 2022-12-12 NOTE — Progress Notes (Signed)
-    GFR 27, down from 36, stop taking Metformin -  BNP 141, slightly elevated but stable -  hgb 8.8, down from 10.1, will discuss during the visit on 12/13/22

## 2022-12-13 ENCOUNTER — Encounter: Payer: Self-pay | Admitting: Adult Health

## 2022-12-13 ENCOUNTER — Ambulatory Visit: Payer: Medicare Other | Admitting: Adult Health

## 2022-12-13 VITALS — BP 122/88 | HR 77 | Temp 97.4°F | Resp 18 | Ht 62.0 in | Wt 171.4 lb

## 2022-12-13 DIAGNOSIS — N179 Acute kidney failure, unspecified: Secondary | ICD-10-CM | POA: Diagnosis not present

## 2022-12-13 DIAGNOSIS — E1143 Type 2 diabetes mellitus with diabetic autonomic (poly)neuropathy: Secondary | ICD-10-CM | POA: Diagnosis not present

## 2022-12-13 DIAGNOSIS — N189 Chronic kidney disease, unspecified: Secondary | ICD-10-CM | POA: Diagnosis not present

## 2022-12-13 DIAGNOSIS — N1832 Chronic kidney disease, stage 3b: Secondary | ICD-10-CM | POA: Diagnosis not present

## 2022-12-13 DIAGNOSIS — E1140 Type 2 diabetes mellitus with diabetic neuropathy, unspecified: Secondary | ICD-10-CM | POA: Diagnosis not present

## 2022-12-13 DIAGNOSIS — M810 Age-related osteoporosis without current pathological fracture: Secondary | ICD-10-CM | POA: Diagnosis not present

## 2022-12-13 DIAGNOSIS — I5032 Chronic diastolic (congestive) heart failure: Secondary | ICD-10-CM

## 2022-12-13 MED ORDER — TORSEMIDE 20 MG PO TABS
20.0000 mg | ORAL_TABLET | Freq: Every day | ORAL | Status: DC
Start: 2022-12-13 — End: 2023-07-04

## 2022-12-13 MED ORDER — POTASSIUM CHLORIDE CRYS ER 20 MEQ PO TBCR
20.0000 meq | EXTENDED_RELEASE_TABLET | Freq: Every day | ORAL | Status: DC
Start: 2022-12-13 — End: 2023-06-22

## 2022-12-13 NOTE — Progress Notes (Unsigned)
[facility] clinic  Provider:   Code Status: *** Goals of Care:     12/13/2022    2:35 PM  Advanced Directives  Does Patient Have a Medical Advance Directive? No  Would patient like information on creating a medical advance directive? No - Patient declined     Chief Complaint  Patient presents with   Follow-up    Monday Follow-up   Quality Metric Gaps    Needs to discuss Medicare Annual Wellness, Eye and Foot exam, Pneumonia, Covid, and Shingrix vaccine, Diabetic kidney evaluation-Urine ACR, and Hemoglobin A1C.    HPI: Patient is a 77 y.o. female seen today for an acute visit for  Past Medical History:  Diagnosis Date   Anemia    Anxiety    Aortic stenosis    a. Now has St Jude mechanical aortic valve (6/00). b. Echo (6/15) with EF 60-65%, mechanical aortic valve with mean gradient 27 mmHg.   Arthritis    a. Possible c-spine arthritis with pain down left arm.     Carotid artery disease (HCC)    a. Carotid dopplers (7/16) with 40-59% BICA stenosis.     Chronic angle-closure glaucoma(365.23)    Chronic diastolic CHF (congestive heart failure) (HCC)    a.  RHC (7/15) with mean RA 2, PA 22/11, mean PCWP 9, CI 3.79.   Contrast media allergy    Coronary atherosclerosis of native coronary artery    a. CABG at time of AVR in 6/00 with RIMA-RCA. b. Abnl nuc 11/2013 -> LHC (7/15) with patent RIMA-RCA, 80% mRCA, 80% pLAD with FFR 0.73, treated with DES to pLAD.   Depression    Essential hypertension    GERD (gastroesophageal reflux disease)    Hyperlipidemia    Low back pain    Neuromuscular disorder (HCC)    neuropathy   Peripheral vascular disease (HCC)    a, H/o Right SFA stent. b. Peripheral arterial dopplers (7/16) with right SFA stent patent.     PONV (postoperative nausea and vomiting)    N&V   Type II diabetes mellitus (HCC)     Past Surgical History:  Procedure Laterality Date   BILATERAL OOPHORECTOMY  05/24/1985   BREAST BIOPSY  05/24/2010   left   CARDIAC  CATHETERIZATION  01/14/1999   normal LV function, severe aortic stenosis; 80% and 70% stenosis in RCA; mild 20% distal norrowing in L main with 20% proximal LAD stenosis, 40% diagonal stenosis and 20% proximal circumflex stenosis   CARDIAC VALVE REPLACEMENT  05/24/1998   aortic valve    CARDIOVASCULAR STRESS TEST  08/25/2011   R/L MV - EF 72%; no scintigraphic evidence of inducible MI; normal perfusionTID of 1.25 elevated - could indicate small vessle subendocardial ischemia; EKG NSR at 66, non diagnostic for ischemia   CARPAL TUNNEL RELEASE  05/25/1987   bilaterally   CATARACT EXTRACTION W/ INTRAOCULAR LENS  IMPLANT, BILATERAL  ~ 2007   CESAREAN SECTION  1969; 1971   CHOLECYSTECTOMY  05/24/1988   COLONOSCOPY N/A 10/27/2012   Procedure: COLONOSCOPY;  Surgeon: Theda Belfast, MD;  Location: [facility];  Service: Endoscopy;  Laterality: N/A;   CORONARY ARTERY BYPASS GRAFT  05/24/1998   RIMA to RCA   CORONARY/GRAFT ANGIOGRAPHY N/A 09/07/2021   Procedure: CORONARY/GRAFT ANGIOGRAPHY;  Surgeon: Orbie Pyo, MD;  Location: [facility] INVASIVE CV LAB;  Service: Cardiovascular;  Laterality: N/A;   DOPPLER ECHOCARDIOGRAPHY  03/29/2012   EF >55%; mild concentric LVH; stage 1 diastolic dysfunction, elevated LV filling pressure, dilated  LA; MAC mild MR; St Jude AVR peak and mean gradients of and ; transvalvular gradients have increased (prev 23 and 14 respectively)   ESOPHAGOGASTRODUODENOSCOPY N/A 10/27/2012   Procedure: ESOPHAGOGASTRODUODENOSCOPY (EGD);  Surgeon: Theda Belfast, MD;  Location: [facility];  Service: Endoscopy;  Laterality: N/A;   FEMORAL ARTERY STENT  09/20/2011   6 x 40 Smart Nitinol self-expanding stent placed;  10/15/2031 -R SFA stent open and patent w/o evidence of restenosis   FRACTIONAL FLOW RESERVE WIRE  12/14/2013   Procedure: FRACTIONAL FLOW RESERVE WIRE;  Surgeon: Laurey Morale, MD;  Location: [facility] CATH LAB;  Service: Cardiovascular;;   KNEE SURGERY Left     LACERATION REPAIR     right hand   LEFT AND RIGHT HEART CATHETERIZATION WITH CORONARY ANGIOGRAM N/A 12/14/2013   Procedure: LEFT AND RIGHT HEART CATHETERIZATION WITH CORONARY ANGIOGRAM;  Surgeon: Laurey Morale, MD;  Location: [facility] CATH LAB;  Service: Cardiovascular;  Laterality: N/A;   LOWER EXTREMITY ANGIOGRAM N/A 09/20/2011   Procedure: LOWER EXTREMITY ANGIOGRAM;  Surgeon: Runell Gess, MD;  Location[facility] CATH LAB;  Service: Cardiovascular;  Laterality: N/A;   LYMPH NODE BIOPSY  06/25/2011   "core needle on 5"   needle biopsy  05/25/2007   "on ankles for nerve damage"   NEUROPLASTY / TRANSPOSITION ULNAR NERVE AT ELBOW     right   ORIF FEMUR FRACTURE Left 09/09/2021   Procedure: OPEN REDUCTION INTERNAL FIXATION (ORIF) DISTAL FEMUR FRACTURE;  Surgeon: Roby Lofts, MD;  Location: [facility] OR;  Service: Orthopedics;  Laterality: Left;   PERCUTANEOUS CORONARY STENT INTERVENTION (PCI-S)  12/14/2013   Procedure: PERCUTANEOUS CORONARY STENT INTERVENTION (PCI-S);  Surgeon: Laurey Morale, MD;  Location: [facility] CATH LAB;  Service: Cardiovascular;;  Prox LAD 3.00x12 Promus DES    PERIPHERAL ARTERIAL STENT GRAFT  09/20/2011   right SFA   REFRACTIVE SURGERY  ` 2004   "for glaucoma"   TONSILLECTOMY  05/24/1966   TRIGGER FINGER RELEASE Left 12/04/2015   Procedure: LEFT RING FINGER TRIGGER RELEASE;  Surgeon: Betha Loa, MD;  Location: [facility];  Service: Orthopedics;  Laterality: Left;   VAGINAL HYSTERECTOMY  05/25/1983   Fibroids    Allergies  Allergen Reactions   Atorvastatin     Muscle pain   Dapagliflozin     Other reaction(s): nausea   Gabapentin     Other reaction(s): stomach issues   Simvastatin Other (See Comments)    Muscle pain   Sulfa Antibiotics     unknown   Iodinated Contrast Media Rash     Red rash after cardiac cath 1 wk ago, ? Contrast allergy, requires 13 hr prep now per [email or web address]      Outpatient Encounter Medications as of 12/13/2022   Medication Sig   Ascorbic Acid (VITAMIN C) 1000 MG tablet Take 1,000 mg by mouth daily.   aspirin EC 81 MG tablet Take 81 mg by mouth every evening.   cetirizine (ZYRTEC) 10 MG tablet Take 1 tablet (10 mg total) by mouth at bedtime.   Cholecalciferol (VITAMIN D3 PO) Take 1,000 Units by mouth daily.   cyanocobalamin (VITAMIN B12) 1000 MCG tablet Take 1,000 mcg by mouth daily.   fluticasone (FLONASE) 50 MCG/ACT nasal spray Place 2 sprays into both nostrils daily.   folic acid (FOLVITE) 800 MCG tablet Take 800 mcg by mouth daily.   hydrOXYzine (VISTARIL) 25 MG capsule Take 1 capsule (25 mg total) by mouth at bedtime as needed.   latanoprost (XALATAN)  0.005 % ophthalmic solution PLACE 1 DROP INTO BOTH EYES AT BEDTIME.   metolazone (ZAROXOLYN) 2.5 MG tablet Take 1 tablet (2.5 mg total) by mouth daily for 3 days.   metoprolol succinate (TOPROL-XL) 25 MG 24 hr tablet Take 50 mg by mouth daily.   pantoprazole (PROTONIX) 40 MG tablet TAKE ONE TABLET BY MOUTH DAILY   potassium chloride SA (KLOR-CON M) 20 MEQ tablet Take 1 tablet (20 mEq total) by mouth 2 (two) times daily.   rosuvastatin (CRESTOR) 10 MG tablet Take 10 mg by mouth every evening.   sertraline (ZOLOFT) 50 MG tablet Take 1 tablet (50 mg total) by mouth daily.   torsemide (DEMADEX) 20 MG tablet Take 2 tablets (40 mg total) by mouth daily.   warfarin (COUMADIN) 3 MG tablet TAKE 1/2 TO 1 TABLET BY MOUTH DAILY AS DIRECTED   [DISCONTINUED] BIOTIN PO Take 5,000 mg by mouth daily.   [DISCONTINUED] metFORMIN (GLUCOPHAGE) 500 MG tablet Take 500 mg by mouth daily.   No facility-administered encounter medications on file as of 12/13/2022.    Review of Systems:  Review of Systems  Health Maintenance  Topic Date Due   Medicare Annual Wellness (AWV)  Never done   Zoster Vaccines- Shingrix (1 of 2) Never done   Pneumonia Vaccine 88+ Years old (1 of 1 - PCV) Never done   FOOT EXAM  02/19/2015   Diabetic kidney evaluation - Urine ACR  03/10/2018    OPHTHALMOLOGY EXAM  04/04/2019   COVID-19 Vaccine (4 - 2023-24 season) 01/22/2022   HEMOGLOBIN A1C  10/27/2022   INFLUENZA VACCINE  12/23/2022   Diabetic kidney evaluation - eGFR measurement  12/10/2023   DTaP/Tdap/Td (4 - Td or Tdap) 03/16/2026   DEXA SCAN  Completed   Hepatitis C Screening  Completed   HPV VACCINES  Aged Out   Colonoscopy  Discontinued    Physical Exam: Vitals:   12/13/22 1432  BP: 122/88  Pulse: 77  Resp: 18  Temp: (!) 97.4 F (36.3 C)  SpO2: 97%  Weight: 171 lb 6.4 oz (77.7 kg)  Height: 5\' 2"  (1.575 m)   Body mass index is 31.35 kg/m. Physical Exam  Labs reviewed: Basic Metabolic Panel: Recent Labs    04/27/22 0959 12/10/22 1421  NA 141 140  K 4.4 4.3  CL 102 106  CO2 30 27  GLUCOSE 162* 145*  BUN 32* 38*  CREATININE 1.50* 1.87*  CALCIUM 9.5 8.9  TSH 2.99  --    Liver Function Tests: Recent Labs    04/27/22 0959  AST 18  ALT 9  BILITOT 1.0  PROT 7.3   No results for input(s): "LIPASE", "AMYLASE" in the last 8760 hours. No results for input(s): "AMMONIA" in the last 8760 hours. CBC: Recent Labs    04/27/22 0959 12/10/22 1421  WBC 5.7 5.1  NEUTROABS 3,716 2,999  HGB 10.1* 8.8*  HCT 31.4* 27.6*  MCV 84.0 88.2  PLT 160 121*   Lipid Panel: No results for input(s): "CHOL", "HDL", "LDLCALC", "TRIG", "CHOLHDL", "LDLDIRECT" in the last 8760 hours. Lab Results  Component Value Date   HGBA1C 6.2 (H) 04/27/2022    Procedures since last visit: No results found.  Assessment/Plan There are no diagnoses linked to this encounter.   Labs/tests ordered:    Next appt:  12/20/2022

## 2022-12-14 DIAGNOSIS — M65341 Trigger finger, right ring finger: Secondary | ICD-10-CM | POA: Diagnosis not present

## 2022-12-16 ENCOUNTER — Other Ambulatory Visit: Payer: Medicare Other

## 2022-12-16 DIAGNOSIS — N189 Chronic kidney disease, unspecified: Secondary | ICD-10-CM | POA: Diagnosis not present

## 2022-12-16 DIAGNOSIS — N179 Acute kidney failure, unspecified: Secondary | ICD-10-CM | POA: Diagnosis not present

## 2022-12-17 NOTE — Progress Notes (Deleted)
-    Discussed result with son, result shows CKD 4 which is a decline. Son will call nephrologist for an earlier appointment.

## 2022-12-20 ENCOUNTER — Telehealth: Payer: Medicare Other | Admitting: Adult Health

## 2022-12-21 NOTE — Progress Notes (Unsigned)
Cardiology Office Note:   Date:  12/22/2022  NAME:  Abigail Oliver    MRN: [id number] DOB:  October 24, 1945   PCP:  Sande Rives, MD  Cardiologist:  None  Electrophysiologist:  None   Referring MD: [name]*   Chief Complaint  Patient presents with   Follow-up         History of Present Illness:   Abigail Oliver is a 77 y.o. female with a hx of CAD s/p CABG, AVR, HFpEF, CKD IV, HLD who presents for follow-up.   She is with her husband.  She tells me she is doing well.  Her kidney function has declined.  She is awaiting to see nephrology.  No signs of volume overload.  She is also anemic.  She had a colonoscopy in 2014.  She was due in 5 years.  Has not had this repeated.  She describes no blood in her stool.  Her anemia could also be due to kidney disease.  She is working with her primary care physician on this.  Lipids seem to be at goal.  Denies any chest pains or trouble breathing.  She has hip pain.  No leg pain.  She does have PAD and is status post right SFA angioplasty.  She has a 50 to 70% stenosis left and that vessel.  This is being followed by Dr. Gery Pray.  She is on Coumadin for mechanical aortic valve.  Most recent echo shows stable prosthetic valve function.  INR goal 2-3.  CV exam unremarkable.  BP well-controlled.  On aspirin as well.  Denies any cardiac complaints today.  Problem List CAD s/p CABG -RIMA to PAD patent -pLAD PCI AoV replacement (2000) -INR 2-3 PAD -R SFA angioplasty 2013 HFpEF CKD IV HLD -T chol 144, HDL 43, LDL 66, TG 217  Past Medical History: Past Medical History:  Diagnosis Date   Anemia    Anxiety    Aortic stenosis    a. Now has St Jude mechanical aortic valve (6/00). b. Echo (6/15) with EF 60-65%, mechanical aortic valve with mean gradient 27 mmHg.   Arthritis    a. Possible c-spine arthritis with pain down left arm.     Carotid artery disease (HCC)    a. Carotid dopplers (7/16) with 40-59% BICA stenosis.      Chronic angle-closure glaucoma(365.23)    Chronic diastolic CHF (congestive heart failure) (HCC)    a.  RHC (7/15) with mean RA 2, PA 22/11, mean PCWP 9, CI 3.79.   Contrast media allergy    Coronary atherosclerosis of native coronary artery    a. CABG at time of AVR in 6/00 with RIMA-RCA. b. Abnl nuc 11/2013 -> LHC (7/15) with patent RIMA-RCA, 80% mRCA, 80% pLAD with FFR 0.73, treated with DES to pLAD.   Depression    Essential hypertension    GERD (gastroesophageal reflux disease)    Hyperlipidemia    Low back pain    Neuromuscular disorder (HCC)    neuropathy   Peripheral vascular disease (HCC)    a, H/o Right SFA stent. b. Peripheral arterial dopplers (7/16) with right SFA stent patent.     PONV (postoperative nausea and vomiting)    N&V   Type II diabetes mellitus [facility])     Past Surgical History: Past Surgical History:  Procedure Laterality Date   BILATERAL OOPHORECTOMY  05/24/1985   BREAST BIOPSY  05/24/2010   left   CARDIAC CATHETERIZATION  01/14/1999   normal LV function, severe  aortic stenosis; 80% and 70% stenosis in RCA; mild 20% distal norrowing in L main with 20% proximal LAD stenosis, 40% diagonal stenosis and 20% proximal circumflex stenosis   CARDIAC VALVE REPLACEMENT  05/24/1998   aortic valve    CARDIOVASCULAR STRESS TEST  08/25/2011   R/L MV - EF 72%; no scintigraphic evidence of inducible MI; normal perfusionTID of 1.25 elevated - could indicate small vessle subendocardial ischemia; EKG NSR at 66, non diagnostic for ischemia   CARPAL TUNNEL RELEASE  05/25/1987   bilaterally   CATARACT EXTRACTION W/ INTRAOCULAR LENS  IMPLANT, BILATERAL  ~ 2007   CESAREAN SECTION  1969; 1971   CHOLECYSTECTOMY  05/24/1988   COLONOSCOPY N/A 10/27/2012   Procedure: COLONOSCOPY;  Surgeon: Theda Belfast, MD;  Location: [facility];  Service: Endoscopy;  Laterality: N/A;   CORONARY ARTERY BYPASS GRAFT  05/24/1998   RIMA to RCA   CORONARY/GRAFT ANGIOGRAPHY N/A 09/07/2021    Procedure: CORONARY/GRAFT ANGIOGRAPHY;  Surgeon: Orbie Pyo, MD;  Location: [facility] INVASIVE CV LAB;  Service: Cardiovascular;  Laterality: N/A;   DOPPLER ECHOCARDIOGRAPHY  03/29/2012   EF >55%; mild concentric LVH; stage 1 diastolic dysfunction, elevated LV filling pressure, dilated LA; MAC mild MR; St Jude AVR peak and mean gradients of and ; transvalvular gradients have increased (prev 23 and 14 respectively)   ESOPHAGOGASTRODUODENOSCOPY N/A 10/27/2012   Procedure: ESOPHAGOGASTRODUODENOSCOPY (EGD);  Surgeon: Theda Belfast, MD;  Location: [facility];  Service: Endoscopy;  Laterality: N/A;   FEMORAL ARTERY STENT  09/20/2011   6 x 40 Smart Nitinol self-expanding stent placed;  10/15/2031 -R SFA stent open and patent w/o evidence of restenosis   FRACTIONAL FLOW RESERVE WIRE  12/14/2013   Procedure: FRACTIONAL FLOW RESERVE WIRE;  Surgeon: Laurey Morale, MD;  Location: [facility] CATH LAB;  Service: Cardiovascular;;   KNEE SURGERY Left    LACERATION REPAIR     right hand   LEFT AND RIGHT HEART CATHETERIZATION WITH CORONARY ANGIOGRAM N/A 12/14/2013   Procedure: LEFT AND RIGHT HEART CATHETERIZATION WITH CORONARY ANGIOGRAM;  Surgeon: Laurey Morale, MD;  Location: [facility] CATH LAB;  Service: Cardiovascular;  Laterality: N/A;   LOWER EXTREMITY ANGIOGRAM N/A 09/20/2011   Procedure: LOWER EXTREMITY ANGIOGRAM;  Surgeon: Runell Gess, MD;  Location: [facility] C[facility] LAB;  Service: Cardiovascular;  Laterality: N/A;   LYMPH NODE BIOPSY  06/25/2011   "core needle on 5"   needle biopsy  05/25/2007   "on ankles for nerve damage"   NEUROPLASTY / TRANSPOSITION ULNAR NERVE AT ELBOW     right   ORIF FEMUR FRACTURE Left 09/09/2021   Procedure: OPEN REDUCTION INTERNAL FIXATION (ORIF) DISTAL FEMUR FRACTURE;  Surgeon: Roby Lofts, MD;  Location: [facility] OR;  Service: Orthopedics;  Laterality: Left;   PERCUTANEOUS CORONARY STENT INTERVENTION (PCI-S)  12/14/2013   Procedure: PERCUTANEOUS CORONARY STENT INTERVENTION  (PCI-S);  Surgeon: Laurey Morale, MD;  Location: [facility] CATH LAB;  Service: Cardiovascular;;  Prox LAD 3.00x12 Promus DES    PERIPHERAL ARTERIAL STENT GRAFT  09/20/2011   right SFA   REFRACTIVE SURGERY  ` 2004   "for glaucoma"   TONSILLECTOMY  05/24/1966   TRIGGER FINGER RELEASE Left 12/04/2015   Procedure: LEFT RING FINGER TRIGGER RELEASE;  Surgeon: Betha Loa, MD;  Location: [facility];  Service: Orthopedics;  Laterality: Left;   VAGINAL HYSTERECTOMY  05/25/1983   Fibroids    Current Medications: Current Meds  Medication Sig   Ascorbic Acid (VITAMIN C) 1000 MG tablet  Take 1,000 mg by mouth daily.   aspirin EC 81 MG tablet Take 81 mg by mouth every evening.   cetirizine (ZYRTEC) 10 MG tablet Take 1 tablet (10 mg total) by mouth at bedtime.   Cholecalciferol (VITAMIN D3 PO) Take 1,000 Units by mouth daily.   cyanocobalamin (VITAMIN B12) 1000 MCG tablet Take 1,000 mcg by mouth daily.   fluticasone (FLONASE) 50 MCG/ACT nasal spray Place 2 sprays into both nostrils daily.   folic acid (FOLVITE) 800 MCG tablet Take 800 mcg by mouth daily.   hydrOXYzine (VISTARIL) 25 MG capsule Take 1 capsule (25 mg total) by mouth at bedtime as needed.   latanoprost (XALATAN) 0.005 % ophthalmic solution PLACE 1 DROP INTO BOTH EYES AT BEDTIME.   metoprolol succinate (TOPROL-XL) 25 MG 24 hr tablet Take 50 mg by mouth daily.   pantoprazole (PROTONIX) 40 MG tablet TAKE ONE TABLET BY MOUTH DAILY   potassium chloride SA (KLOR-CON M) 20 MEQ tablet Take 1 tablet (20 mEq total) by mouth daily.   rosuvastatin (CRESTOR) 10 MG tablet Take 10 mg by mouth every evening.   sertraline (ZOLOFT) 50 MG tablet Take 1 tablet (50 mg total) by mouth daily.   torsemide (DEMADEX) 20 MG tablet Take 1 tablet (20 mg total) by mouth daily.   warfarin (COUMADIN) 3 MG tablet TAKE 1/2 TO 1 TABLET BY MOUTH DAILY AS DIRECTED     Allergies:    Atorvastatin, Dapagliflozin, Gabapentin, Simvastatin, Sulfa antibiotics, and  Iodinated contrast media   Social History: Social History   Socioeconomic History   Marital status: Married    Spouse name: Jomarie Longs   Number of children: 2   Years of education: 14   Highest education level: Not on file  Occupational History    Comment: Producer, television/film/video  Tobacco Use   Smoking status: Never   Smokeless tobacco: Never  Vaping Use   Vaping status: Never Used  Substance and Sexual Activity   Alcohol use: Not Currently   Drug use: Not Currently   Sexual activity: Never  Other Topics Concern   Not on file  Social History Narrative   Pt is a high school graduate with 2 years of college. Married in 1967 she has 1 son born 77 and 1 daughter born 84 and 1 grandchild. 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      Caffeine use- 2-3 /day, soda, tea       Lives with husband one story home   Social Determinants of Health   Financial Resource Strain: Not on file  Food Insecurity: Not on file  Transportation Needs: Not on file  Physical Activity: Not on file  Stress: Not on file  Social Connections: Not on file     Family History: The patient's family history includes COPD in her mother; Cancer in her maternal grandfather and maternal grandmother; Colon cancer in her mother; Emphysema in her mother; Heart disease in her brother. There is no history of Diabetes.  ROS:   All other ROS reviewed and negative. Pertinent positives noted in the HPI.     EKGs/Labs/Other Studies Reviewed:   The following studies were personally reviewed by me today:  EKG:  EKG is ordered today.    EKG Interpretation Date/Time:  Wednesday December 22 2022 08:58:53 EDT Ventricular Rate:  65 PR Interval:  176 QRS Duration:  76 QT Interval:  456 QTC Calculation: 474 R Axis:   63  Text Interpretation: Sinus rhythm with  occasional Premature ventricular complexes Nonspecific ST abnormality Confirmed by Lennie Odor [id number]) on 12/22/2022 9:02:41 AM   TTE 08/25/2022  1.  Left ventricular ejection fraction, by estimation, is 60 to 65%. Left  ventricular ejection fraction by 3D volume is 62 %. The left ventricle has  normal function. The left ventricle has no regional wall motion  abnormalities. There is mild left  ventricular hypertrophy. Left ventricular diastolic parameters are  indeterminate.   2. Right ventricular systolic function is mildly reduced. The right  ventricular size is normal. There is normal pulmonary artery systolic  pressure. The estimated right ventricular systolic pressure is 34.0 mmHg.   3. Left atrial size was severely dilated.   4. The mitral valve is degenerative. Mild mitral valve regurgitation. No  evidence of mitral stenosis. Moderate mitral annular calcification.   5. The aortic valve has been repaired/replaced. Aortic valve  regurgitation is trivial. There is a unknown St. Jude mechanical valve  present in the aortic position. Procedure Date: 10/1998. Echo findings are  consistent with normal structure and function  of the aortic valve prosthesis. Aortic valve area, by VTI measures 1.69  cm, iEOA 0.96 cm2/m2. Aortic valve mean gradient measures 15.7 mmHg.  Aortic valve Vmax measures 2.70 m/s. Aortic valve acceleration time  measures 102 msec. AT/ET 0.30, DVI 0.41.   6. Aortic dilatation noted. There is borderline dilatation of the  ascending aorta, measuring 38 mm.   7. The inferior vena cava is normal in size with <50% respiratory  variability, suggesting right atrial pressure of 8 mmHg.   Recent Labs: 04/27/2022: ALT 9; TSH 2.99 12/10/2022: Brain Natriuretic Peptide 141; Hemoglobin 8.8; Platelets 121 12/16/2022: BUN 74; Creat 2.39; Potassium 3.7; Sodium 140   Recent Lipid Panel    Component Value Date/Time   CHOL 144 01/30/2021 0857   TRIG 217 (H) 01/30/2021 0857   HDL 43 01/30/2021 0857   CHOLHDL 3.3 01/30/2021 0857   CHOLHDL 2.9 03/25/2020 1015   VLDL 44 (H) 01/17/2019 1038   LDLCALC 66 01/30/2021 0857   LDLCALC  62 03/25/2020 1015   LDLDIRECT 138.0 10/02/2014 0958    Physical Exam:   VS:  BP 124/84   Pulse 65   Ht 5\' 2"  (1.575 m)   Wt 169 lb (76.7 kg)   SpO2 93%   BMI 30.91 kg/m    Wt Readings from Last 3 Encounters:  12/22/22 169 lb (76.7 kg)  12/13/22 171 lb 6.4 oz (77.7 kg)  12/10/22 176 lb (79.8 kg)    General: Well nourished, well developed, in no acute distress Head: Atraumatic, normal size  Eyes: PEERLA, EOMI  Neck: Supple, no JVD Endocrine: No thryomegaly Cardiac: Normal mechanical S2, no murmurs Lungs: Clear to auscultation bilaterally, no wheezing, rhonchi or rales  Abd: Soft, nontender, no hepatomegaly  Ext: No edema, pulses 2+ Musculoskeletal: No deformities, BUE and BLE strength normal and equal Skin: Warm and dry, no rashes   Neuro: Alert and oriented to person, place, time, and situation, CNII-XII grossly intact, no focal deficits  Psych: Normal mood and affect   ASSESSMENT:   KENNIDI REYNOLD is a 77 y.o. female who presents for the following: 1. Coronary artery disease involving coronary bypass graft of native heart without angina pectoris   2. H/O aortic valve replacement   3. Chronic anticoagulation   4. Chronic diastolic heart failure (HCC)   5. PAD (peripheral artery disease) (HCC)   6. Iron deficiency anemia, unspecified iron deficiency anemia type  PLAN:   1. H/O aortic valve replacement -Status post aortic valve replacement with [facility] Jude bileaflet valve in 2000.  INR goal 2-3.  Echo from this year shows stable valve function.  No issues.  2. Coronary artery disease involving native heart without angina pectoris, unspecified vessel or lesion type 3. HLD 4. PAD -History of CABG in 2000.  Cath last year shows patent RIMA to PDA.  She has a proximal LAD stent that is patent as well.  She has no symptoms of angina today.  She is on aspirin for the valve as well as CAD and PAD.  She is on Coumadin. -Lipid lipids at goal.  No indications to treat her  triglycerides at this point. -Her right SFA artery was intervened upon in 2013.  She is a moderate 50 to 74% stenosis left.  No symptoms of PAD.  This is being followed with medical management.  We will set her up for yearly ultrasound.  She is due. -No symptoms of angina.  No symptoms of claudication.  Will continue with medical therapy.  5. HFpEF -Euvolemic on exam.  On torsemide 20 mg daily.  On potassium.  Watching her kidney function closely.  7. Anemia  -She is anemic.  Most recent echo shows normal valve function.  She does have worsening kidney function.  This could explain her anemia.  She is also due for colonoscopy.  I have reached out to her primary care physician to see if we can facilitate this.  Disposition: Return in about 1 year (around 12/22/2023).  Medication Adjustments/Labs and Tests Ordered: Current medicines are reviewed at length with the patient today.  Concerns regarding medicines are outlined above.  Orders Placed This Encounter  Procedures   EKG 12-Lead   VAS [place] LOWER EXTREMITY ARTERIAL DUPLEX   No orders of the defined types were placed in this encounter.  Patient Instructions  Medication Instructions:  NO CHANGES *If you need a refill on your cardiac medications before your next appointment, please call your pharmacy*   Lab Work: NONE If you have labs (blood work) drawn today and your tests are completely normal, you will receive your results only by: MyChart Message (if you have MyChart) OR A paper copy in the mail If you have any lab test that is abnormal or we need to change your treatment, we will call you to review the results.   Testing/Procedures: Your physician has requested that you have a lower or upper extremity arterial duplex. This test is an ultrasound of the arteries in the legs or arms. It looks at arterial blood flow in the legs and arms. Allow one hour for Lower and Upper Arterial scans. There are no restrictions or special  instructions    Follow-Up: At [facility], you and your health needs are our priority.  As part of our continuing mission to provide you with exceptional heart care, we have created designated Provider Care Teams.  These Care Teams include your primary Cardiologist (physician) and Advanced Practice Providers (APPs -  Physician Assistants and Nurse Practitioners) who all work together to provide you with the care you need, when you need it.   Your next appointment:   1 year(s)  Provider:   DR. Flora Lipps      Time Spent with Patient: I have spent a total of 35 minutes with patient reviewing hospital notes, telemetry, EKGs, labs and examining the patient as well as establishing an assessment and plan that was discussed with the patient.  >  50% of time was spent in direct patient care.  Signed, Lenna Gilford. Flora Lipps, MD, [facility]  [facility]  [address] [place], [place] [address] [phone]  12/22/2022 9:29 AM

## 2022-12-21 NOTE — Telephone Encounter (Signed)
I have not recently seen patient and last contact was a telephone call in 2020

## 2022-12-21 NOTE — Telephone Encounter (Signed)
Patient aware to speak with podiatry for recommendations

## 2022-12-22 ENCOUNTER — Encounter: Payer: Self-pay | Admitting: Cardiovascular Disease

## 2022-12-22 ENCOUNTER — Ambulatory Visit: Payer: Medicare Other | Attending: Cardiovascular Disease | Admitting: Cardiovascular Disease

## 2022-12-22 VITALS — BP 124/84 | HR 65 | Ht 62.0 in | Wt 169.0 lb

## 2022-12-22 DIAGNOSIS — I5032 Chronic diastolic (congestive) heart failure: Secondary | ICD-10-CM | POA: Insufficient documentation

## 2022-12-22 DIAGNOSIS — I739 Peripheral vascular disease, unspecified: Secondary | ICD-10-CM | POA: Insufficient documentation

## 2022-12-22 DIAGNOSIS — I25810 Atherosclerosis of coronary artery bypass graft(s) without angina pectoris: Secondary | ICD-10-CM | POA: Diagnosis not present

## 2022-12-22 DIAGNOSIS — Z952 Presence of prosthetic heart valve: Secondary | ICD-10-CM | POA: Insufficient documentation

## 2022-12-22 DIAGNOSIS — Z7901 Long term (current) use of anticoagulants: Secondary | ICD-10-CM | POA: Diagnosis not present

## 2022-12-22 DIAGNOSIS — D509 Iron deficiency anemia, unspecified: Secondary | ICD-10-CM | POA: Diagnosis not present

## 2022-12-22 NOTE — Patient Instructions (Signed)
Medication Instructions:  NO CHANGES *If you need a refill on your cardiac medications before your next appointment, please call your pharmacy*   Lab Work: NONE If you have labs (blood work) drawn today and your tests are completely normal, you will receive your results only by: MyChart Message (if you have MyChart) OR A paper copy in the mail If you have any lab test that is abnormal or we need to change your treatment, we will call you to review the results.   Testing/Procedures: Your physician has requested that you have a lower or upper extremity arterial duplex. This test is an ultrasound of the arteries in the legs or arms. It looks at arterial blood flow in the legs and arms. Allow one hour for Lower and Upper Arterial scans. There are no restrictions or special instructions    Follow-Up: At [facility], you and your health needs are our priority.  As part of our continuing mission to provide you with exceptional heart care, we have created designated Provider Care Teams.  These Care Teams include your primary Cardiologist (physician) and Advanced Practice Providers (APPs -  Physician Assistants and Nurse Practitioners) who all work together to provide you with the care you need, when you need it.   Your next appointment:   1 year(s)  Provider:   DR. Flora Lipps

## 2022-12-24 ENCOUNTER — Encounter (HOSPITAL_COMMUNITY): Payer: Medicare Other | Admitting: Cardiology

## 2022-12-24 ENCOUNTER — Other Ambulatory Visit (HOSPITAL_COMMUNITY): Payer: Self-pay | Admitting: Cardiovascular Disease

## 2022-12-24 DIAGNOSIS — I739 Peripheral vascular disease, unspecified: Secondary | ICD-10-CM

## 2022-12-27 ENCOUNTER — Ambulatory Visit: Payer: Medicare Other | Attending: Cardiovascular Disease

## 2022-12-27 DIAGNOSIS — Z952 Presence of prosthetic heart valve: Secondary | ICD-10-CM

## 2022-12-27 DIAGNOSIS — Z7901 Long term (current) use of anticoagulants: Secondary | ICD-10-CM | POA: Diagnosis not present

## 2022-12-27 LAB — POCT INR: INR: 4.6 — AB (ref 2.0–3.0)

## 2022-12-27 NOTE — Patient Instructions (Signed)
HOLD TODAY and TUESDAY THEN Continue taking warfarin 1 tablet daily except for 1/2 tablet on Saturday.  Repeat INR in 2 weeks.  Coumadin Clinic [phone]

## 2022-12-30 ENCOUNTER — Ambulatory Visit: Payer: Medicare Other | Admitting: Professional Counselor

## 2022-12-31 ENCOUNTER — Ambulatory Visit (HOSPITAL_COMMUNITY)
Admission: RE | Admit: 2022-12-31 | Discharge: 2022-12-31 | Disposition: A | Discharge status: Home / Self Care | Payer: Medicare Other | Source: Ambulatory Visit | Attending: Cardiovascular Disease | Admitting: Cardiovascular Disease

## 2022-12-31 DIAGNOSIS — M65341 Trigger finger, right ring finger: Secondary | ICD-10-CM | POA: Diagnosis not present

## 2022-12-31 DIAGNOSIS — I739 Peripheral vascular disease, unspecified: Secondary | ICD-10-CM | POA: Diagnosis not present

## 2022-12-31 LAB — VAS US ABI WITH/WO TBI
Left ABI: 1.05
Right ABI: 1.01

## 2023-01-07 ENCOUNTER — Encounter: Payer: Self-pay | Admitting: Professional Counselor

## 2023-01-07 ENCOUNTER — Ambulatory Visit (INDEPENDENT_AMBULATORY_CARE_PROVIDER_SITE_OTHER): Payer: Medicare Other | Admitting: Professional Counselor

## 2023-01-07 DIAGNOSIS — F411 Generalized anxiety disorder: Secondary | ICD-10-CM | POA: Diagnosis not present

## 2023-01-07 NOTE — Progress Notes (Unsigned)
      [facility] Counselor/Therapist Progress Note  Patient ID: ASTI FAVREAU, MRN: [id number],    Date: 01/07/2023  Time Spent: 9:07a - 10:05a   Treatment Type: Individual Therapy  Reported Symptoms: worries, stress, sadness, grief/loss, interpersonal concerns  Mental Status Exam:  Appearance:   Neat     Behavior:  Appropriate, Sharing, and Motivated  Motor:  Normal  Speech/Language:   Clear and Coherent and Normal Rate  Affect:  Appropriate and Congruent  Mood:  normal  Thought process:  normal  Thought content:    WNL  Sensory/Perceptual disturbances:    WNL  Orientation:  oriented to person, place, time/date, and situation  Attention:  Good  Concentration:  Good  Memory:  WNL  Fund of knowledge:   Good  Insight:    Good  Judgment:   Good  Impulse Control:  Good   Risk Assessment: Danger to Self:  No Self-injurious Behavior: No Danger to Others: No Duty to Warn:no Physical Aggression / Violence:No  Access to Firearms a concern: No  Gang Involvement:No   Subjective: Pt presented to session voicing progress with her boundaries implementation since last session. She voiced having been through a difficult time with family member's behaviors of concern, that caused distress and implicated pt resources, but stated that she had made declarations of her needs and non-negotiables, including breaking communication, as a result. Counselor affirmed pt proactive and self protective measures, an helped pt resource strength and hope. Counselor encouraged pt to consider attending Al-Anon support group in addition to individual therapy. Counselor and pt worked on treatment plan; to be continued due to technical difficulties.   Interventions: Solution-Oriented/Positive Psychology, Humanistic/Existential, and Insight-Oriented  Diagnosis:   ICD-10-CM   1. Generalized anxiety disorder  F41.1       Plan: Pt is scheduled for a follow-up; continue process work and developing coping  skills.   Gaspar Bidding, [facility]

## 2023-01-10 ENCOUNTER — Ambulatory Visit: Payer: Medicare Other

## 2023-01-10 DIAGNOSIS — Z952 Presence of prosthetic heart valve: Secondary | ICD-10-CM

## 2023-01-10 DIAGNOSIS — I5030 Unspecified diastolic (congestive) heart failure: Secondary | ICD-10-CM | POA: Diagnosis not present

## 2023-01-10 DIAGNOSIS — E1122 Type 2 diabetes mellitus with diabetic chronic kidney disease: Secondary | ICD-10-CM | POA: Diagnosis not present

## 2023-01-10 DIAGNOSIS — N1832 Chronic kidney disease, stage 3b: Secondary | ICD-10-CM | POA: Diagnosis not present

## 2023-01-10 DIAGNOSIS — N189 Chronic kidney disease, unspecified: Secondary | ICD-10-CM | POA: Diagnosis not present

## 2023-01-10 DIAGNOSIS — E785 Hyperlipidemia, unspecified: Secondary | ICD-10-CM | POA: Diagnosis not present

## 2023-01-10 DIAGNOSIS — Z7901 Long term (current) use of anticoagulants: Secondary | ICD-10-CM

## 2023-01-10 DIAGNOSIS — N179 Acute kidney failure, unspecified: Secondary | ICD-10-CM | POA: Diagnosis not present

## 2023-01-10 LAB — CBC AND DIFFERENTIAL
HCT: 32 — AB (ref 36–46)
Hemoglobin: 10.5 — AB (ref 12.0–16.0)
Neutrophils Absolute: 3.5
Platelets: 128 10*3/uL — AB (ref 150–400)
WBC: 6.2

## 2023-01-10 LAB — IRON,TIBC AND FERRITIN PANEL
Ferritin: 69
Iron: 65
TIBC: 331
UIBC: 266

## 2023-01-10 LAB — BASIC METABOLIC PANEL
BUN: 36 — AB (ref 4–21)
CO2: 28 — AB (ref 13–22)
Chloride: 100 (ref 99–108)
Creatinine: 1.7 — AB (ref 0.5–1.1)
Glucose: 208
Potassium: 4 meq/L (ref 3.5–5.1)
Sodium: 141 (ref 137–147)

## 2023-01-10 LAB — PROTEIN / CREATININE RATIO, URINE
Albumin, U: 82.1
Creatinine, Urine: 95.4

## 2023-01-10 LAB — COMPREHENSIVE METABOLIC PANEL
Albumin: 4.6 (ref 3.5–5.0)
Calcium: 9.9 (ref 8.7–10.7)
Globulin: 2.3

## 2023-01-10 LAB — HEPATIC FUNCTION PANEL
ALT: 11 U/L (ref 7–35)
AST: 20 (ref 13–35)
Alkaline Phosphatase: 80 (ref 25–125)
Bilirubin, Total: 0.7

## 2023-01-10 LAB — CBC: RBC: 3.72 — AB (ref 3.87–5.11)

## 2023-01-10 LAB — POCT INR: INR: 5.4 — AB (ref 2.0–3.0)

## 2023-01-10 NOTE — Patient Instructions (Signed)
HOLD TODAY, TUESDAY and WEDNESDAY THEN DECREASE TO 1 tablet daily except for 0.5 TABLET MONDAY, WEDNESDAY and FRIDAY.  Repeat INR in 2 weeks.  Coumadin Clinic [phone]

## 2023-01-14 ENCOUNTER — Ambulatory Visit: Payer: Medicare Other | Admitting: Professional Counselor

## 2023-01-21 ENCOUNTER — Encounter: Payer: Self-pay | Admitting: Professional Counselor

## 2023-01-21 ENCOUNTER — Ambulatory Visit (INDEPENDENT_AMBULATORY_CARE_PROVIDER_SITE_OTHER): Payer: Medicare Other | Admitting: Professional Counselor

## 2023-01-21 DIAGNOSIS — F411 Generalized anxiety disorder: Secondary | ICD-10-CM | POA: Diagnosis not present

## 2023-01-21 DIAGNOSIS — F331 Major depressive disorder, recurrent, moderate: Secondary | ICD-10-CM

## 2023-01-21 NOTE — Progress Notes (Signed)
      [facility] Counselor/Therapist Progress Note  Patient ID: JEANINA FAILING, MRN: [id number],    Date: 01/21/2023  Time Spent: 10:11a - 11:14a   Treatment Type: Individual Therapy  Reported Symptoms: tearfulness, worries, sadness, grief/loss, stress, caregiver strain, interpersonal concerns   Mental Status Exam:  Appearance:   Neat     Behavior:  Appropriate and Sharing  Motor:  Normal  Speech/Language:   Clear and Coherent and Normal Rate  Affect:  Tearful  Mood:  Sad  Thought process:  normal  Thought content:    WNL  Sensory/Perceptual disturbances:    WNL  Orientation:  oriented to person, place, time/date, and situation  Attention:  Good  Concentration:  Good  Memory:  WNL  Fund of knowledge:   Good  Insight:    Good  Judgment:   Good  Impulse Control:  Good   Risk Assessment: Danger to Self:  No Self-injurious Behavior: No Danger to Others: No Duty to Warn:no Physical Aggression / Violence:No  Access to Firearms a concern: No  Gang Involvement:No   Subjective: Pt presented to session with updates regarding family members and her heightened concern around their well-being, and impact on her own wellness as a result. She grieved circumstances and her wish for betterment. Counselor actively listened, affirmed pt feelings and experience, and helped pt to strategize around approaches to her concerns, including possible framework for intervention on behalf of one family member, voicing feelings and needs with another, and resourcing greater support for her husband and his medical needs. Counselor assisted pt with assorted resources including senior resources in her county, given delicate needs around husband's care and her stress in context. Pt and counselor discussed pt treatment plan; pt gave consent.   Interventions: Solution-Oriented/Positive Psychology, Humanistic/Existential, and Insight-Oriented  Diagnosis:   ICD-10-CM   1. Generalized anxiety disorder   F41.1     2. Major depressive disorder, recurrent episode, moderate (HCC)  F33.1       Plan: Pt is scheduled for a follow-up; continue process work and developing coping skills.   Gaspar Bidding, [facility]

## 2023-01-28 ENCOUNTER — Encounter: Payer: Self-pay | Admitting: Professional Counselor

## 2023-01-28 ENCOUNTER — Ambulatory Visit: Payer: Medicare Other | Attending: Cardiovascular Disease | Admitting: *Deleted

## 2023-01-28 DIAGNOSIS — Z952 Presence of prosthetic heart valve: Secondary | ICD-10-CM | POA: Insufficient documentation

## 2023-01-28 DIAGNOSIS — Z7901 Long term (current) use of anticoagulants: Secondary | ICD-10-CM | POA: Insufficient documentation

## 2023-01-28 LAB — POCT INR: INR: 4.7 — AB (ref 2.0–3.0)

## 2023-01-28 NOTE — Patient Instructions (Signed)
Description   DO NOT TAKE ANY WARFARIN TOMORROW AND NO WARFARIN SUNDAY THEN DECREASE TO 1/2 TABLET daily except for 1 TABLET SUNDAY, TUESDAY, AND THURSDAY. Repeat INR in 2 weeks.  Coumadin Clinic [phone]

## 2023-02-08 ENCOUNTER — Ambulatory Visit (INDEPENDENT_AMBULATORY_CARE_PROVIDER_SITE_OTHER): Payer: Medicare Other | Admitting: Professional Counselor

## 2023-02-08 ENCOUNTER — Encounter: Payer: Self-pay | Admitting: Professional Counselor

## 2023-02-08 DIAGNOSIS — F331 Major depressive disorder, recurrent, moderate: Secondary | ICD-10-CM | POA: Diagnosis not present

## 2023-02-08 DIAGNOSIS — F411 Generalized anxiety disorder: Secondary | ICD-10-CM

## 2023-02-08 NOTE — Progress Notes (Signed)
      [facility] Counselor/Therapist Progress Note  Patient ID: Abigail Oliver, MRN: [id number],    Date: 02/08/2023  Time Spent: 9:12a - 10:11a   Treatment Type: Individual Therapy  Reported Symptoms: sadness, worries, stress, interpersonal concerns  Mental Status Exam:  Appearance:   Neat     Behavior:  Appropriate and Sharing  Motor:  Normal  Speech/Language:   Clear and Coherent and Normal Rate  Affect:  Appropriate and Congruent  Mood:  normal  Thought process:  normal  Thought content:    WNL  Sensory/Perceptual disturbances:    WNL  Orientation:  oriented to person, place, time/date, and situation  Attention:  Good  Concentration:  Good  Memory:  WNL  Fund of knowledge:   Good  Insight:    Good  Judgment:   Good  Impulse Control:  Good   Risk Assessment: Danger to Self:  No Self-injurious Behavior: No Danger to Others: No Duty to Warn:no Physical Aggression / Violence:No  Access to Firearms a concern: No  Gang Involvement:No   Subjective: Pt presented to session voicing ongoing limited contact with adult daughter and considerable stress regarding unfolding events in her life. Pt voiced intention to not engage with situation, stating that while part of her wishes she could be of support, another part of her knows it would make her feel worse to be present, and she voiced the latter to be felt predominately. Counselor affirmed pt self wisdom in knowing her needs. Pt and counselor discussed pt other ways of self advocating and implementing boundaries in family relationships, including as relates son and ways aspects of his life implicate stress for her also. She identified son to also be proactive in helping with husband's care for which she is grtafeul. Pt and counselor discussed pt possibly going to support groups again.  Interventions: Solution-Oriented/Positive Psychology, Humanistic/Existential, and Insight-Oriented  Diagnosis:   ICD-10-CM   1. Generalized  anxiety disorder  F41.1     2. Major depressive disorder, recurrent episode, moderate (HCC)  F33.1       Plan: Pt is scheduled for a follow-up; continue process work and developing coping skills.   Gaspar Bidding, [facility]

## 2023-02-09 ENCOUNTER — Other Ambulatory Visit (HOSPITAL_COMMUNITY): Payer: Self-pay | Admitting: Cardiovascular Disease

## 2023-02-09 DIAGNOSIS — I739 Peripheral vascular disease, unspecified: Secondary | ICD-10-CM

## 2023-02-11 ENCOUNTER — Ambulatory Visit: Payer: Medicare Other

## 2023-02-14 ENCOUNTER — Ambulatory Visit: Payer: Medicare Other | Admitting: Professional Counselor

## 2023-02-16 ENCOUNTER — Encounter: Payer: Self-pay | Admitting: Adult Health

## 2023-02-16 ENCOUNTER — Ambulatory Visit (INDEPENDENT_AMBULATORY_CARE_PROVIDER_SITE_OTHER): Payer: Medicare Other | Admitting: Adult Health

## 2023-02-16 VITALS — BP 122/87 | HR 60 | Temp 97.6°F | Resp 18 | Ht 62.0 in | Wt 176.0 lb

## 2023-02-16 DIAGNOSIS — E669 Obesity, unspecified: Secondary | ICD-10-CM

## 2023-02-16 DIAGNOSIS — Z6830 Body mass index (BMI) 30.0-30.9, adult: Secondary | ICD-10-CM

## 2023-02-16 DIAGNOSIS — I5032 Chronic diastolic (congestive) heart failure: Secondary | ICD-10-CM

## 2023-02-16 DIAGNOSIS — D638 Anemia in other chronic diseases classified elsewhere: Secondary | ICD-10-CM

## 2023-02-16 DIAGNOSIS — Z952 Presence of prosthetic heart valve: Secondary | ICD-10-CM

## 2023-02-16 DIAGNOSIS — Z5320 Procedure and treatment not carried out because of patient's decision for unspecified reasons: Secondary | ICD-10-CM

## 2023-02-16 DIAGNOSIS — E1143 Type 2 diabetes mellitus with diabetic autonomic (poly)neuropathy: Secondary | ICD-10-CM

## 2023-02-16 DIAGNOSIS — I10 Essential (primary) hypertension: Secondary | ICD-10-CM

## 2023-02-16 DIAGNOSIS — F32A Depression, unspecified: Secondary | ICD-10-CM

## 2023-02-16 DIAGNOSIS — R21 Rash and other nonspecific skin eruption: Secondary | ICD-10-CM | POA: Diagnosis not present

## 2023-02-16 DIAGNOSIS — E781 Pure hyperglyceridemia: Secondary | ICD-10-CM

## 2023-02-16 DIAGNOSIS — R5383 Other fatigue: Secondary | ICD-10-CM

## 2023-02-16 DIAGNOSIS — F5101 Primary insomnia: Secondary | ICD-10-CM

## 2023-02-16 DIAGNOSIS — Z2821 Immunization not carried out because of patient refusal: Secondary | ICD-10-CM

## 2023-02-16 DIAGNOSIS — F339 Major depressive disorder, recurrent, unspecified: Secondary | ICD-10-CM | POA: Diagnosis not present

## 2023-02-16 DIAGNOSIS — N184 Chronic kidney disease, stage 4 (severe): Secondary | ICD-10-CM

## 2023-02-16 MED ORDER — MELATONIN 5 MG PO TABS
5.0000 mg | ORAL_TABLET | Freq: Every day | ORAL | Status: DC
Start: 2023-02-16 — End: 2023-09-21

## 2023-02-16 MED ORDER — HYDROCORTISONE 0.5 % EX CREA
1.0000 | TOPICAL_CREAM | Freq: Two times a day (BID) | CUTANEOUS | 0 refills | Status: AC
Start: 2023-02-16 — End: 2023-03-02

## 2023-02-16 NOTE — Progress Notes (Signed)
[facility] clinic  Provider: Kenard Gower DNP  Code Status:  Full Code  Goals of Care:     02/16/2023    9:02 AM  Advanced Directives  Does Patient Have a Medical Advance Directive? No  Would patient like information on creating a medical advance directive? No - Patient declined     Chief Complaint  Patient presents with   Acute Visit    Exhausted...can't sleep...not steady when walking...weight loss options    HPI: Patient is a 77 y.o. female seen today for an acute visit for feeling exhausted, not steady when walking and weight loss. She was accompanied by her husband.   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 Valentino Nose  Anemia of chronic disease  -  hgb 8.8, 12/10/22   Past Medical History:  Diagnosis Date   Anemia    Anxiety    Aortic stenosis    a. Now has St Jude mechanical aortic valve (6/00). b. Echo (6/15) with EF 60-65%, mechanical aortic valve with mean gradient 27 mmHg.   Arthritis    a. Possible c-spine arthritis with pain down left arm.     Carotid artery disease (HCC)    a. Carotid dopplers (7/16) with 40-59% BICA stenosis.     Chronic angle-closure glaucoma(365.23)    Chronic diastolic CHF (congestive heart failure) (HCC)    a.  RHC (7/15) with mean RA 2, PA 22/11, mean PCWP 9, CI 3.79.   Contrast media allergy    Coronary atherosclerosis of native coronary artery    a. CABG at time of AVR in 6/00 with RIMA-RCA. b. Abnl nuc 11/2013 -> LHC (7/15) with patent  RIMA-RCA, 80% mRCA, 80% pLAD with FFR 0.73, treated with DES to pLAD.   Depression    Essential hypertension    GERD (gastroesophageal reflux disease)    Hyperlipidemia    Low back pain    Neuromuscular disorder (HCC)    neuropathy   Peripheral vascular disease (HCC)    a, H/o Right SFA stent. b. Peripheral arterial dopplers (7/16) with right SFA stent patent.     PONV (postoperative nausea and vomiting)    N&V   Type II diabetes mellitus (HCC)     Past Surgical History:  Procedure Laterality Date   BILATERAL OOPHORECTOMY  05/24/1985   BREAST BIOPSY  05/24/2010   left   CARDIAC CATHETERIZATION  01/14/1999   normal LV function, severe aortic stenosis; 80% and 70% stenosis in RCA; mild 20% distal norrowing in L main with 20% proximal LAD stenosis, 40% diagonal stenosis and 20% proximal circumflex stenosis   CARDIAC VALVE REPLACEMENT  05/24/1998   aortic valve    CARDIOVASCULAR STRESS TEST  08/25/2011   R/L MV - EF 72%; no scintigraphic evidence of inducible MI; normal perfusionTID of 1.25 elevated - could indicate small vessle subendocardial ischemia; EKG NSR at 66, non diagnostic for ischemia   CARPAL TUNNEL RELEASE  05/25/1987   bilaterally   CATARACT EXTRACTION W/ INTRAOCULAR LENS  IMPLANT, BILATERAL  ~ 2007  CESAREAN SECTION  1969; 1971   CHOLECYSTECTOMY  05/24/1988   COLONOSCOPY N/A 10/27/2012   Procedure: COLONOSCOPY;  Surgeon: Theda Belfast, MD;  Location: [facility] END[facility]COPY;  Service: Endoscopy;  Laterality: N/A;   CORONARY ARTERY BYPASS GRAFT  05/24/1998   RIMA to RCA   CORONARY/GRAFT ANGIOGRAPHY N/A 09/07/2021   Procedure: CORONARY/GRAFT ANGIOGRAPHY;  Surgeon: Orbie Pyo, MD;  Location: [facility] INVASIVE CV LAB;  Service: Cardiovascular;  Laterality: N/A;   DOPPLER ECHOCARDIOGRAPHY  03/29/2012   EF >55%; mild concentric LVH; stage 1 diastolic dysfunction, elevated LV filling pressure, dilated LA; MAC mild MR; St Jude AVR peak and mean gradients of and ;  transvalvular gradients have increased (prev 23 and 14 respectively)   ESOPHAGOGASTRODUODENOSCOPY N/A 10/27/2012   Procedure: ESOPHAGOGASTRODUODENOSCOPY (EGD);  Surgeon: Theda Belfast, MD;  Location: [facility];  Service: Endoscopy;  Laterality: N/A;   FEMORAL ARTERY STENT  09/20/2011   6 x 40 Smart Nitinol self-expanding stent placed;  10/15/2031 -R SFA stent open and patent w/o evidence of restenosis   FRACTIONAL FLOW RESERVE WIRE  12/14/2013   Procedure: FRACTIONAL FLOW RESERVE WIRE;  Surgeon: Laurey Morale, MD;  Location: [facility] CATH LAB;  Service: Cardiovascular;;   KNEE SURGERY Left    LACERATION REPAIR     right hand   LEFT AND RIGHT HEART CATHETERIZATION WITH CORONARY ANGIOGRAM N/A 12/14/2013   Procedure: LEFT AND RIGHT HEART CATHETERIZATION WITH CORONARY ANGIOGRAM;  Surgeon: Laurey Morale, MD;  Location: [facility] CATH LAB;  Service: Cardiovascular;  Laterality: N/A;   LOWER EXTREMITY ANGIOGRAM N/A 09/20/2011   Procedure: LOWER EXTREMITY ANGIOGRAM;  Surgeon: Runell Gess, MD;  Location: [facility] CATH LAB;  Service: Cardiovascular;  Laterality: N/A;   LYMPH NODE BIOPSY  06/25/2011   "core needle on 5"   needle biopsy  05/25/2007   "on ankles for nerve damage"   NEUROPLASTY / TRANSPOSITION ULNAR NERVE AT ELBOW     right   ORIF FEMUR FRACTURE Left 09/09/2021   Procedure: OPEN REDUCTION INTERNAL FIXATION (ORIF) DISTAL FEMUR FRACTURE;  Surgeon: Roby Lofts, MD;  Location: [facility] OR;  Service: Orthopedics;  Laterality: Left;   PERCUTANEOUS CORONARY STENT INTERVENTION (PCI-S)  12/14/2013   Procedure: PERCUTANEOUS CORONARY STENT INTERVENTION (PCI-S);  Surgeon: Laurey Morale, MD;  Location: [facility] CATH LAB;  Service: Cardiovascular;;  Prox LAD 3.00x12 Promus DES    PERIPHERAL ARTERIAL STENT GRAFT  09/20/2011   right SFA   REFRACTIVE SURGERY  ` 2004   "for glaucoma"   TONSILLECTOMY  05/24/1966   TRIGGER FINGER RELEASE Left 12/04/2015   Procedure: LEFT RING FINGER TRIGGER RELEASE;  Surgeon: Betha Loa, MD;  Location: [facility];  Service: Orthopedics;  Laterality: Left;   VAGINAL HYSTERECTOMY  05/25/1983   Fibroids    Allergies  Allergen Reactions   Atorvastatin     Muscle pain   Dapagliflozin     Other reaction(s): nausea   Gabapentin     Other reaction(s): stomach issues   Simvastatin Other (See Comments)    Muscle pain   Sulfa Antibiotics     unknown   Iodinated Contrast Media Rash     Red rash after cardiac cath 1 wk ago, ? Contrast allergy, requires 13 hr prep now per [email or web address]      Outpatient Encounter Medications as of 02/16/2023  Medication Sig   Ascorbic Acid (VITAMIN C) 1000 MG tablet Take 1,000 mg by mouth daily.   aspirin EC 81 MG tablet Take 81 mg  by mouth every evening.   cetirizine (ZYRTEC) 10 MG tablet Take 1 tablet (10 mg total) by mouth at bedtime.   Cholecalciferol (VITAMIN D3 PO) Take 1,000 Units by mouth daily.   cyanocobalamin (VITAMIN B12) 1000 MCG tablet Take 1,000 mcg by mouth daily.   fluticasone (FLONASE) 50 MCG/ACT nasal spray Place 2 sprays into both nostrils daily.   folic acid (FOLVITE) 800 MCG tablet Take 800 mcg by mouth daily.   hydrOXYzine (VISTARIL) 25 MG capsule Take 1 capsule (25 mg total) by mouth at bedtime as needed.   latanoprost (XALATAN) 0.005 % ophthalmic solution PLACE 1 DROP INTO BOTH EYES AT BEDTIME.   metoprolol succinate (TOPROL-XL) 25 MG 24 hr tablet Take 50 mg by mouth daily.   pantoprazole (PROTONIX) 40 MG tablet TAKE ONE TABLET BY MOUTH DAILY   potassium chloride SA (KLOR-CON M) 20 MEQ tablet Take 1 tablet (20 mEq total) by mouth daily.   rosuvastatin (CRESTOR) 10 MG tablet Take 10 mg by mouth every evening.   sertraline (ZOLOFT) 50 MG tablet Take 1 tablet (50 mg total) by mouth daily.   torsemide (DEMADEX) 20 MG tablet Take 1 tablet (20 mg total) by mouth daily.   warfarin (COUMADIN) 3 MG tablet TAKE 1/2 TO 1 TABLET BY MOUTH DAILY AS DIRECTED   No facility-administered encounter  medications on file as of 02/16/2023.    Review of Systems:  Review of Systems  Constitutional:  Negative for appetite change, chills, fatigue and fever.  HENT:  Negative for congestion, hearing loss, rhinorrhea and sore throat.   Eyes: Negative.   Respiratory:  Negative for cough, shortness of breath and wheezing.   Cardiovascular:  Negative for chest pain, palpitations and leg swelling.  Gastrointestinal:  Negative for abdominal pain, constipation, diarrhea, nausea and vomiting.  Genitourinary:  Negative for dysuria.  Musculoskeletal:  Negative for arthralgias, back pain and myalgias.  Skin:  Positive for rash. Negative for color change and wound.  Neurological:  Negative for dizziness, weakness and headaches.  Psychiatric/Behavioral:  Positive for sleep disturbance. Negative for behavioral problems. The patient is not nervous/anxious.     Health Maintenance  Topic Date Due   Medicare Annual Wellness (AWV)  Never done   Zoster Vaccines- Shingrix (1 of 2) Never done   Pneumonia Vaccine 13+ Years old (1 of 1 - PCV) Never done   FOOT EXAM  02/19/2015   Diabetic kidney evaluation - Urine ACR  03/10/2018   OPHTHALMOLOGY EXAM  04/04/2019   HEMOGLOBIN A1C  10/27/2022   INFLUENZA VACCINE  12/23/2022   COVID-19 Vaccine (4 - 2023-24 season) 01/23/2023   Diabetic kidney evaluation - eGFR measurement  12/16/2023   DTaP/Tdap/Td (4 - Td or Tdap) 03/16/2026   DEXA SCAN  Completed   Hepatitis C Screening  Completed   HPV VACCINES  Aged Out   Colonoscopy  Discontinued    Physical Exam: Vitals:   02/16/23 0858  Height: 5\' 2"  (1.575 m)   Body mass index is 30.91 kg/m. Physical Exam Constitutional:      General: She is not in acute distress.    Appearance: She is obese.  HENT:     Head: Normocephalic and atraumatic.     Nose: Nose normal.     Mouth/Throat:     Mouth: Mucous membranes are moist.  Eyes:     Conjunctiva/sclera: Conjunctivae normal.  Cardiovascular:     Rate and  Rhythm: Normal rate and regular rhythm.  Pulmonary:     Effort: Pulmonary effort  is normal.     Breath sounds: Normal breath sounds.  Abdominal:     General: Bowel sounds are normal.     Palpations: Abdomen is soft.  Musculoskeletal:        General: Normal range of motion.     Cervical back: Normal range of motion.  Skin:    General: Skin is warm and dry.     Comments: Dry rashes on bilateral lower legs and right upper arm  Neurological:     General: No focal deficit present.     Mental Status: She is alert and oriented to person, place, and time.  Psychiatric:        Mood and Affect: Mood normal.        Behavior: Behavior normal.        Thought Content: Thought content normal.        Judgment: Judgment normal.     Labs reviewed: Basic Metabolic Panel: Recent Labs    04/27/22 0959 12/10/22 1421 12/16/22 0916  NA 141 140 140  K 4.4 4.3 3.7  CL 102 106 98  CO2 30 27 27   GLUCOSE 162* 145* 156*  BUN 32* 38* 74*  CREATININE 1.50* 1.87* 2.39*  CALCIUM 9.5 8.9 9.5  TSH 2.99  --   --    Liver Function Tests: Recent Labs    04/27/22 0959  AST 18  ALT 9  BILITOT 1.0  PROT 7.3   No results for input(s): "LIPASE", "AMYLASE" in the last 8760 hours. No results for input(s): "AMMONIA" in the last 8760 hours. CBC: Recent Labs    04/27/22 0959 12/10/22 1421  WBC 5.7 5.1  NEUTROABS 3,716 2,999  HGB 10.1* 8.8*  HCT 31.4* 27.6*  MCV 84.0 88.2  PLT 160 121*   Lipid Panel: No results for input(s): "CHOL", "HDL", "LDLCALC", "TRIG", "CHOLHDL", "LDLDIRECT" in the last 8760 hours. Lab Results  Component Value Date   HGBA1C 6.2 (H) 04/27/2022    Procedures since last visit: No results found.  Assessment/Plan  1. Fatigue due to depression -  stable -  continue Zoloft  2. Primary insomnia -  continue Vistaril Q HS -  add  Melatonin - melatonin 5 MG TABS; Take 1 tablet (5 mg total) by mouth at bedtime.  3. Type II diabetes mellitus with peripheral autonomic  neuropathy (HCC) -  not on medication - Microalbumin/Creatinine Ratio, Urine - Hemoglobin A1C - Lipid panel - Ambulatory referral to Ophthalmology  4. Chronic diastolic heart failure (HCC) -  no edema, no SOB -  continue Metoprolol succinate and Torsemide  5. Rash -  apply lotion to extremities  - hydrocortisone cream 0.5 %; Apply 1 Application topically 2 (two) times daily for 14 days. Apply to rashes on right shoulder and both lower legs  Dispense: 28 g; Refill: 0  6. Essential hypertension -  BP stable -  continue Metoprolol succinate - Lipid panel  7. Stage 4 chronic kidney disease (HCC) -  follows up with nephrology - Basic Metabolic Panel with eGFR  8. Anemia of chronic disease -  hgb 8.8 - CBC with Differential/Platelets  9. Class 1 obesity with body mass index (BMI) of 30.0 to 30.9 in adult, unspecified obesity type, unspecified whether serious comorbidity present Body mass index is 32.19 kg/m. -  counseled regarding diet and exercise  10. H/O aortic valve replacement -  continue Warfarin  11. Hypertriglyceridemia -  continue Rosuvastatin  12. Colonoscopy refused -  declines colonoscopy  13. Flu vaccine refused -  declined flu vaccine    Labs/tests ordered:  BMP, urine microalbumin, CBC, lipid panel, A1C  Next appt:  Visit date not found

## 2023-02-17 LAB — MICROALBUMIN / CREATININE URINE RATIO
Creatinine, Urine: 18 mg/dL — ABNORMAL LOW (ref 20–275)
Microalb Creat Ratio: 67 mg/g creat — ABNORMAL HIGH (ref <30)
Microalb, Ur: 1.2 mg/dL

## 2023-02-17 LAB — CBC WITH DIFFERENTIAL/PLATELET
Absolute Monocytes: 374 cells/uL (ref 200–950)
Basophils Absolute: 38 cells/uL (ref 0–200)
Basophils Relative: 0.8 %
Eosinophils Absolute: 101 cells/uL (ref 15–500)
Eosinophils Relative: 2.1 %
HCT: 31.6 % — ABNORMAL LOW (ref 35.0–45.0)
Hemoglobin: 9.8 g/dL — ABNORMAL LOW (ref 11.7–15.5)
Lymphs Abs: 1469 cells/uL (ref 850–3900)
MCH: 27.5 pg (ref 27.0–33.0)
MCHC: 31 g/dL — ABNORMAL LOW (ref 32.0–36.0)
MCV: 88.8 fL (ref 80.0–100.0)
MPV: 11.8 fL (ref 7.5–12.5)
Monocytes Relative: 7.8 %
Neutro Abs: 2818 cells/uL (ref 1500–7800)
Neutrophils Relative %: 58.7 %
Platelets: 113 10*3/uL — ABNORMAL LOW (ref 140–400)
RBC: 3.56 10*6/uL — ABNORMAL LOW (ref 3.80–5.10)
RDW: 14.5 % (ref 11.0–15.0)
Total Lymphocyte: 30.6 %
WBC: 4.8 10*3/uL (ref 3.8–10.8)

## 2023-02-17 LAB — LIPID PANEL
Cholesterol: 167 mg/dL (ref <200)
HDL: 46 mg/dL — ABNORMAL LOW (ref >=50)
LDL Cholesterol (Calc): 83 mg/dL (calc)
Non-HDL Cholesterol (Calc): 121 mg/dL (calc) (ref <130)
Total CHOL/HDL Ratio: 3.6 (calc) (ref <5.0)
Triglycerides: 286 mg/dL — ABNORMAL HIGH (ref <150)

## 2023-02-17 LAB — BASIC METABOLIC PANEL WITH GFR
BUN/Creatinine Ratio: 22 (calc) (ref 6–22)
BUN: 35 mg/dL — ABNORMAL HIGH (ref 7–25)
CO2: 25 mmol/L (ref 20–32)
Calcium: 9.8 mg/dL (ref 8.6–10.4)
Chloride: 105 mmol/L (ref 98–110)
Creat: 1.59 mg/dL — ABNORMAL HIGH (ref 0.60–1.00)
Glucose, Bld: 242 mg/dL — ABNORMAL HIGH (ref 65–139)
Potassium: 4.5 mmol/L (ref 3.5–5.3)
Sodium: 140 mmol/L (ref 135–146)
eGFR: 33 mL/min/{1.73_m2} — ABNORMAL LOW (ref >=60)

## 2023-02-17 LAB — HEMOGLOBIN A1C
Hgb A1c MFr Bld: 6.5 % of total Hgb — ABNORMAL HIGH (ref <5.7)
Mean Plasma Glucose: 140 mg/dL
eAG (mmol/L): 7.7 mmol/L

## 2023-02-18 ENCOUNTER — Ambulatory Visit: Payer: Medicare Other | Attending: Internal Medicine | Admitting: *Deleted

## 2023-02-18 DIAGNOSIS — Z7901 Long term (current) use of anticoagulants: Secondary | ICD-10-CM | POA: Diagnosis not present

## 2023-02-18 DIAGNOSIS — Z952 Presence of prosthetic heart valve: Secondary | ICD-10-CM

## 2023-02-18 LAB — POCT INR: INR: 2.4 (ref 2.0–3.0)

## 2023-02-18 NOTE — Patient Instructions (Signed)
Description   Today take 1 tablet of warfarin then continue taking 1/2 tablet daily except for 1 tablet on SUNDAY, TUESDAY, AND THURSDAY. Repeat INR in 3 weeks.  [facility] [phone]

## 2023-02-20 NOTE — Progress Notes (Signed)
-    urine microalbumin/creatinine ratio elevated, kidney function stable, follow up with nephrology -   electrolytes normal -  triglycerides 286, up from 217, continue Rosuvastatin, -  A1C 6.5, up from 6.2 , will need to exercise at least 150 minutes/week and low carb diet, increase vegetable intake -  hgb 9.8, improved from 8.8 -  platelet 113, down from 121, will need to re-check, currently on Coumadin, monitor INR

## 2023-02-21 DIAGNOSIS — N1832 Chronic kidney disease, stage 3b: Secondary | ICD-10-CM | POA: Diagnosis not present

## 2023-02-21 DIAGNOSIS — E1122 Type 2 diabetes mellitus with diabetic chronic kidney disease: Secondary | ICD-10-CM | POA: Diagnosis not present

## 2023-02-21 DIAGNOSIS — I5030 Unspecified diastolic (congestive) heart failure: Secondary | ICD-10-CM | POA: Diagnosis not present

## 2023-02-21 DIAGNOSIS — E785 Hyperlipidemia, unspecified: Secondary | ICD-10-CM | POA: Diagnosis not present

## 2023-03-04 ENCOUNTER — Ambulatory Visit: Payer: Medicare Other | Admitting: Professional Counselor

## 2023-03-04 NOTE — Progress Notes (Signed)
Pt did not show for scheduled appt, no message.  Bartolo Darter, [facility]

## 2023-03-11 ENCOUNTER — Ambulatory Visit: Payer: Medicare Other | Attending: Cardiovascular Disease | Admitting: *Deleted

## 2023-03-11 DIAGNOSIS — Z7901 Long term (current) use of anticoagulants: Secondary | ICD-10-CM | POA: Insufficient documentation

## 2023-03-11 DIAGNOSIS — Z952 Presence of prosthetic heart valve: Secondary | ICD-10-CM | POA: Insufficient documentation

## 2023-03-11 LAB — POCT INR: POC INR: 2.1

## 2023-03-11 NOTE — Patient Instructions (Signed)
Description   Today take 1 tablet of warfarin then START taking warfarin 1 tablet daily except for 1/2 a tablet on Monday, Wednesday and Friday. Repeat INR in 3 weeks.  [facility] Clinic [phone]

## 2023-03-14 ENCOUNTER — Ambulatory Visit: Payer: Medicare Other | Admitting: Professional Counselor

## 2023-03-14 ENCOUNTER — Encounter: Payer: Self-pay | Admitting: Professional Counselor

## 2023-03-14 DIAGNOSIS — F331 Major depressive disorder, recurrent, moderate: Secondary | ICD-10-CM | POA: Diagnosis not present

## 2023-03-14 DIAGNOSIS — F411 Generalized anxiety disorder: Secondary | ICD-10-CM

## 2023-03-14 NOTE — Progress Notes (Unsigned)
      [facility] Counselor/Therapist Progress Note  Patient ID: SOLASH GUIST, MRN: [id number],    Date: 03/14/2023  Time Spent: fears, worries, stress, interpersonal concerns,   Treatment Type: Individual Therapy  Reported Symptoms:   Mental Status Exam:  Appearance:   Neat     Behavior:  Appropriate, Sharing, and Motivated  Motor:  Normal  Speech/Language:   Clear and Coherent and Normal Rate  Affect:  Appropriate and Congruent  Mood:  anxious  Thought process:  normal  Thought content:    WNL  Sensory/Perceptual disturbances:    WNL  Orientation:  Sound  Attention:  Good  Concentration:  Good  Memory:  WNL  Fund of knowledge:   Good  Insight:    Good  Judgment:   Good  Impulse Control:  Good   Risk Assessment: Danger to Self:  No Self-injurious Behavior: No Danger to Others: No Duty to Warn:no Physical Aggression / Violence:No  Access to Firearms a concern: No  Gang Involvement:No   Subjective: ***   Interventions: Assertiveness/Communication, Solution-Oriented/Positive Psychology, Humanistic/Existential, and Insight-Oriented  Diagnosis:   ICD-10-CM   1. Generalized anxiety disorder  F41.1     2. Major depressive disorder, recurrent episode, moderate (HCC)  F33.1       Plan: ***  Gaspar Bidding, [facility]

## 2023-03-18 DIAGNOSIS — H401134 Primary open-angle glaucoma, bilateral, indeterminate stage: Secondary | ICD-10-CM | POA: Diagnosis not present

## 2023-03-18 DIAGNOSIS — H04123 Dry eye syndrome of bilateral lacrimal glands: Secondary | ICD-10-CM | POA: Diagnosis not present

## 2023-03-18 DIAGNOSIS — E1140 Type 2 diabetes mellitus with diabetic neuropathy, unspecified: Secondary | ICD-10-CM | POA: Diagnosis not present

## 2023-03-18 DIAGNOSIS — H43393 Other vitreous opacities, bilateral: Secondary | ICD-10-CM | POA: Diagnosis not present

## 2023-03-18 DIAGNOSIS — E119 Type 2 diabetes mellitus without complications: Secondary | ICD-10-CM | POA: Diagnosis not present

## 2023-03-18 DIAGNOSIS — B354 Tinea corporis: Secondary | ICD-10-CM | POA: Diagnosis not present

## 2023-03-18 DIAGNOSIS — M810 Age-related osteoporosis without current pathological fracture: Secondary | ICD-10-CM | POA: Diagnosis not present

## 2023-03-18 DIAGNOSIS — N1832 Chronic kidney disease, stage 3b: Secondary | ICD-10-CM | POA: Diagnosis not present

## 2023-03-18 LAB — HM DIABETES EYE EXAM

## 2023-03-23 ENCOUNTER — Other Ambulatory Visit: Payer: Self-pay

## 2023-03-23 ENCOUNTER — Ambulatory Visit: Payer: Medicare Other | Admitting: Professional Counselor

## 2023-03-23 DIAGNOSIS — I10 Essential (primary) hypertension: Secondary | ICD-10-CM

## 2023-03-23 NOTE — Telephone Encounter (Signed)
Pt's pharmacy is requesting a refill on medication metoprolol. Would Dr. Flora Lipps like to refill this medication? Please address

## 2023-03-28 ENCOUNTER — Ambulatory Visit: Payer: Medicare Other | Admitting: Professional Counselor

## 2023-03-28 ENCOUNTER — Other Ambulatory Visit: Payer: Self-pay

## 2023-03-28 ENCOUNTER — Encounter: Payer: Self-pay | Admitting: Professional Counselor

## 2023-03-28 DIAGNOSIS — I10 Essential (primary) hypertension: Secondary | ICD-10-CM

## 2023-03-28 DIAGNOSIS — F411 Generalized anxiety disorder: Secondary | ICD-10-CM

## 2023-03-28 DIAGNOSIS — F331 Major depressive disorder, recurrent, moderate: Secondary | ICD-10-CM | POA: Diagnosis not present

## 2023-03-28 MED ORDER — METOPROLOL SUCCINATE ER 25 MG PO TB24: 50.0000 mg | ORAL_TABLET | Freq: Every day | ORAL | 2 refills | Status: DC

## 2023-03-28 NOTE — Progress Notes (Signed)
      [facility] Counselor/Therapist Progress Note  Patient ID: Abigail Oliver, MRN: [id number],    Date: 03/28/2023  Time Spent: 11:20a - 12:22p   Treatment Type: Individual Therapy  Reported Symptoms: tearfulness, worries, sadness, grief/loss, stress, interpersonal concerns   Mental Status Exam:  Appearance:   Neat     Behavior:  Appropriate, Sharing, and Motivated  Motor:  Normal  Speech/Language:   Clear and Coherent and Normal Rate  Affect:  Depressed and Tearful  Mood:  depressed and sad  Thought process:  normal  Thought content:    WNL  Sensory/Perceptual disturbances:    WNL  Orientation:  Sound  Attention:  Good  Concentration:  Good  Memory:  WNL  Fund of knowledge:   Good  Insight:    Good  Judgment:   Good  Impulse Control:  Good   Risk Assessment: Danger to Self:  No Self-injurious Behavior: No Danger to Others: No Duty to Warn:no Physical Aggression / Violence:No  Access to Firearms a concern: No  Gang Involvement:No   Subjective: Pt presented to session voicing limited progress with symptomology in terms of setting boundaries that might alleviate her sense of despair and stressful experience of enabling. She processed the experience of heartbreak in her family circumstance of strife and as pertains to longtime patterns and dynamics of complex concern. Pt explored the dissonance she feels in terms of her spirituality that values kindness, and her heart as a mother, and yet her knowing that her efforts toward "peace at any price" do not seem to help what is beyond her control. She also voiced desire for overall enhanced sense of respect and kind treatment in family system. Counselor assisted pt in resourcing her inner wisdom around self protection and advocacy in some regards, and letting go and acceptance in others, and held space for pt grieving and loss. Counselor helped pt develop alternative ideas to giving money pt fears will be put to ill use, such as  online orders for groceries.  Interventions: Assertiveness/Communication, Motivational Interviewing, Solution-Oriented/Positive Psychology, Humanistic/Existential, and Insight-Oriented  Diagnosis:   ICD-10-CM   1. Generalized anxiety disorder  F41.1     2. Major depressive disorder, recurrent episode, moderate (HCC)  F33.1       Plan: Pt is scheduled for a follow-up; continue process work and developing coping skills. Pt to continue to practice short term goals of implementing boundaries per strategies discussed above.  Abigail Oliver, [facility]

## 2023-03-30 ENCOUNTER — Encounter (HOSPITAL_COMMUNITY): Payer: Self-pay

## 2023-03-30 ENCOUNTER — Other Ambulatory Visit: Payer: Self-pay

## 2023-03-30 ENCOUNTER — Emergency Department (HOSPITAL_COMMUNITY): Payer: Medicare Other

## 2023-03-30 ENCOUNTER — Observation Stay (HOSPITAL_COMMUNITY)
Admission: EM | Admit: 2023-03-30 | Discharge: 2023-04-01 | Disposition: A | Discharge status: Home / Self Care | Payer: Medicare Other | Attending: Internal Medicine | Admitting: Internal Medicine

## 2023-03-30 DIAGNOSIS — D631 Anemia in chronic kidney disease: Secondary | ICD-10-CM | POA: Insufficient documentation

## 2023-03-30 DIAGNOSIS — F419 Anxiety disorder, unspecified: Secondary | ICD-10-CM | POA: Diagnosis present

## 2023-03-30 DIAGNOSIS — I130 Hypertensive heart and chronic kidney disease with heart failure and stage 1 through stage 4 chronic kidney disease, or unspecified chronic kidney disease: Secondary | ICD-10-CM | POA: Diagnosis not present

## 2023-03-30 DIAGNOSIS — M542 Cervicalgia: Secondary | ICD-10-CM | POA: Diagnosis not present

## 2023-03-30 DIAGNOSIS — I10 Essential (primary) hypertension: Secondary | ICD-10-CM | POA: Diagnosis present

## 2023-03-30 DIAGNOSIS — Z79899 Other long term (current) drug therapy: Secondary | ICD-10-CM | POA: Insufficient documentation

## 2023-03-30 DIAGNOSIS — I2510 Atherosclerotic heart disease of native coronary artery without angina pectoris: Secondary | ICD-10-CM | POA: Diagnosis not present

## 2023-03-30 DIAGNOSIS — Z7982 Long term (current) use of aspirin: Secondary | ICD-10-CM | POA: Diagnosis not present

## 2023-03-30 DIAGNOSIS — R079 Chest pain, unspecified: Principal | ICD-10-CM | POA: Diagnosis present

## 2023-03-30 DIAGNOSIS — I509 Heart failure, unspecified: Secondary | ICD-10-CM | POA: Diagnosis not present

## 2023-03-30 DIAGNOSIS — E785 Hyperlipidemia, unspecified: Secondary | ICD-10-CM | POA: Diagnosis present

## 2023-03-30 DIAGNOSIS — Z7901 Long term (current) use of anticoagulants: Secondary | ICD-10-CM | POA: Insufficient documentation

## 2023-03-30 DIAGNOSIS — Z951 Presence of aortocoronary bypass graft: Secondary | ICD-10-CM | POA: Diagnosis not present

## 2023-03-30 DIAGNOSIS — I5032 Chronic diastolic (congestive) heart failure: Secondary | ICD-10-CM | POA: Diagnosis not present

## 2023-03-30 DIAGNOSIS — R0789 Other chest pain: Secondary | ICD-10-CM | POA: Diagnosis not present

## 2023-03-30 DIAGNOSIS — I517 Cardiomegaly: Secondary | ICD-10-CM | POA: Diagnosis not present

## 2023-03-30 DIAGNOSIS — D696 Thrombocytopenia, unspecified: Secondary | ICD-10-CM | POA: Insufficient documentation

## 2023-03-30 DIAGNOSIS — E1122 Type 2 diabetes mellitus with diabetic chronic kidney disease: Secondary | ICD-10-CM | POA: Diagnosis not present

## 2023-03-30 DIAGNOSIS — R0989 Other specified symptoms and signs involving the circulatory and respiratory systems: Secondary | ICD-10-CM | POA: Diagnosis not present

## 2023-03-30 DIAGNOSIS — N1832 Chronic kidney disease, stage 3b: Secondary | ICD-10-CM | POA: Insufficient documentation

## 2023-03-30 DIAGNOSIS — Z952 Presence of prosthetic heart valve: Secondary | ICD-10-CM | POA: Diagnosis not present

## 2023-03-30 LAB — CBC
HCT: 33.2 % — ABNORMAL LOW (ref 36.0–46.0)
Hemoglobin: 10.6 g/dL — ABNORMAL LOW (ref 12.0–15.0)
MCH: 28.6 pg (ref 26.0–34.0)
MCHC: 31.9 g/dL (ref 30.0–36.0)
MCV: 89.7 fL (ref 80.0–100.0)
Platelets: 131 10*3/uL — ABNORMAL LOW (ref 150–400)
RBC: 3.7 MIL/uL — ABNORMAL LOW (ref 3.87–5.11)
RDW: 16 % — ABNORMAL HIGH (ref 11.5–15.5)
WBC: 8.5 10*3/uL (ref 4.0–10.5)
nRBC: 0 % (ref 0.0–0.2)

## 2023-03-30 LAB — PROTIME-INR
INR: 1.4 — ABNORMAL HIGH (ref 0.8–1.2)
Prothrombin Time: 17.7 s — ABNORMAL HIGH (ref 11.4–15.2)

## 2023-03-30 LAB — BASIC METABOLIC PANEL
Anion gap: 9 (ref 5–15)
BUN: 32 mg/dL — ABNORMAL HIGH (ref 8–23)
CO2: 25 mmol/L (ref 22–32)
Calcium: 9 mg/dL (ref 8.9–10.3)
Chloride: 105 mmol/L (ref 98–111)
Creatinine, Ser: 1.36 mg/dL — ABNORMAL HIGH (ref 0.44–1.00)
GFR, Estimated: 40 mL/min — ABNORMAL LOW (ref >60)
Glucose, Bld: 213 mg/dL — ABNORMAL HIGH (ref 70–99)
Potassium: 3.8 mmol/L (ref 3.5–5.1)
Sodium: 139 mmol/L (ref 135–145)

## 2023-03-30 LAB — TROPONIN I (HIGH SENSITIVITY)
Troponin I (High Sensitivity): 7 ng/L (ref <18)
Troponin I (High Sensitivity): 7 ng/L (ref <18)

## 2023-03-30 LAB — HEPATIC FUNCTION PANEL
ALT: 27 U/L (ref 0–44)
AST: 38 U/L (ref 15–41)
Albumin: 4 g/dL (ref 3.5–5.0)
Alkaline Phosphatase: 64 U/L (ref 38–126)
Bilirubin, Direct: 0.2 mg/dL (ref 0.0–0.2)
Indirect Bilirubin: 0.7 mg/dL (ref 0.3–0.9)
Total Bilirubin: 0.9 mg/dL (ref <1.2)
Total Protein: 7.2 g/dL (ref 6.5–8.1)

## 2023-03-30 LAB — LIPASE, BLOOD: Lipase: 54 U/L — ABNORMAL HIGH (ref 11–51)

## 2023-03-30 MED ORDER — FENTANYL CITRATE PF 50 MCG/ML IJ SOSY
50.0000 ug | PREFILLED_SYRINGE | Freq: Once | INTRAMUSCULAR | Status: AC
  Administered 2023-03-30: 50 ug via INTRAVENOUS
  Filled 2023-03-30: qty 1

## 2023-03-30 MED ORDER — ONDANSETRON HCL 4 MG/2ML IJ SOLN
INTRAMUSCULAR | Status: AC
  Filled 2023-03-30: qty 2

## 2023-03-30 MED ORDER — ONDANSETRON HCL 4 MG/2ML IJ SOLN: 4.0000 mg | Freq: Once | INTRAMUSCULAR | Status: DC

## 2023-03-30 MED ORDER — NITROGLYCERIN 0.4 MG SL SUBL
0.4000 mg | SUBLINGUAL_TABLET | SUBLINGUAL | Status: DC | PRN
  Administered 2023-03-30: 0.4 mg via SUBLINGUAL
  Filled 2023-03-30: qty 1

## 2023-03-30 MED ORDER — METOPROLOL SUCCINATE ER 50 MG PO TB24: 50.0000 mg | ORAL_TABLET | Freq: Every day | ORAL | 3 refills | Status: DC

## 2023-03-30 NOTE — ED Provider Notes (Signed)
Abigail Oliver EMERGENCY DEPARTMENT AT [facility] Provider Note   CSN: [id number] Arrival date & time: 03/30/23  1747     History  Chief Complaint  Patient presents with   Chest Pain   Shortness of Breath    Abigail Oliver is a 77 y.o. female.  The history is provided by the patient and medical records. No language interpreter was used.  Chest Pain Pain location:  L chest Pain quality: aching, crushing, dull, pressure and radiating   Pain radiates to:  L jaw and neck Pain severity:  Severe Onset quality:  Gradual Duration:  2 hours Timing:  Constant Progression:  Waxing and waning Chronicity:  New Context: not trauma   Relieved by:  Nothing Worsened by:  Nothing Ineffective treatments:  None tried Associated symptoms: fatigue, lower extremity edema (chronic and unchanged per pt), nausea and shortness of breath   Associated symptoms: no abdominal pain, no altered mental status, no back pain, no claudication, no cough, no diaphoresis, no dizziness, no fever, no headache, no numbness, no palpitations and no vomiting   Risk factors: coronary artery disease   Risk factors: no prior DVT/PE   Shortness of Breath Associated symptoms: chest pain   Associated symptoms: no abdominal pain, no claudication, no cough, no diaphoresis, no fever, no headaches, no neck pain, no rash, no vomiting and no wheezing        Home Medications Prior to Admission medications   Medication Sig Start Date End Date Taking? Authorizing Provider  Ascorbic Acid (VITAMIN C) 1000 MG tablet Take 1,000 mg by mouth daily.    [provider]  aspirin EC 81 MG tablet Take 81 mg by mouth every evening. 02/21/15   Laurey Morale, MD  cetirizine (ZYRTEC) 10 MG tablet Take 1 tablet (10 mg total) by mouth at bedtime. 08/05/22   Medina-Vargas, Monina C, NP  Cholecalciferol (VITAMIN D3 PO) Take 1,000 Units by mouth daily.    [provider]  cyanocobalamin (VITAMIN B12) 1000 MCG tablet  Take 1,000 mcg by mouth daily.    [provider]  fluticasone (FLONASE) 50 MCG/ACT nasal spray Place 2 sprays into both nostrils daily. 08/05/22   Medina-Vargas, Monina C, NP  folic acid (FOLVITE) 800 MCG tablet Take 800 mcg by mouth daily.    [provider]  hydrOXYzine (VISTARIL) 25 MG capsule Take 1 capsule (25 mg total) by mouth at bedtime as needed. 12/10/22   Medina-Vargas, Monina C, NP  latanoprost (XALATAN) 0.005 % ophthalmic solution PLACE 1 DROP INTO BOTH EYES AT BEDTIME. 07/25/14   Myrlene Broker, MD  melatonin 5 MG TABS Take 1 tablet (5 mg total) by mouth at bedtime. 02/16/23   Medina-Vargas, Monina C, NP  metoprolol succinate (TOPROL-XL) 50 MG 24 hr tablet Take 1 tablet (50 mg total) by mouth daily. Take with or immediately following a meal. 03/30/23 06/28/23  O'Neal, Ronnald Ramp, MD  pantoprazole (PROTONIX) 40 MG tablet TAKE ONE TABLET BY MOUTH DAILY 09/28/21   Saguier, Ramon Dredge, PA-C  potassium chloride SA (KLOR-CON M) 20 MEQ tablet Take 1 tablet (20 mEq total) by mouth daily. 12/13/22   Medina-Vargas, Monina C, NP  rosuvastatin (CRESTOR) 10 MG tablet Take 10 mg by mouth every evening.    [provider]  sertraline (ZOLOFT) 50 MG tablet Take 1 tablet (50 mg total) by mouth daily. 12/10/22   Medina-Vargas, Monina C, NP  torsemide (DEMADEX) 20 MG tablet Take 1 tablet (20 mg total) by mouth daily. 12/13/22  Medina-Vargas, Monina C, NP  warfarin (COUMADIN) 3 MG tablet TAKE 1/2 TO 1 TABLET BY MOUTH DAILY AS DIRECTED 11/08/22   Runell Gess, MD      Allergies    Atorvastatin, Dapagliflozin, Gabapentin, Simvastatin, Sulfa antibiotics, and Iodinated contrast media    Review of Systems   Review of Systems  Constitutional:  Positive for fatigue. Negative for chills, diaphoresis and fever.  HENT:  Negative for congestion.   Eyes:  Negative for visual disturbance.  Respiratory:  Positive for chest tightness and shortness of breath. Negative for cough and  wheezing.   Cardiovascular:  Positive for chest pain. Negative for palpitations and claudication. Leg swelling: unchanged. Gastrointestinal:  Positive for nausea. Negative for abdominal pain, constipation, diarrhea and vomiting.  Genitourinary:  Negative for dysuria.  Musculoskeletal:  Negative for back pain, neck pain and neck stiffness.  Skin:  Negative for rash and wound.  Neurological:  Positive for light-headedness. Negative for dizziness, numbness and headaches.  Psychiatric/Behavioral:  Negative for agitation.   All other systems reviewed and are negative.   Physical Exam Updated Vital Signs BP (!) 132/48   Pulse 73   Temp 98.5 F (36.9 C) (Oral)   Resp 18   Ht 5\' 2"  (1.575 m)   Wt 79.8 kg   SpO2 97%   BMI 32.19 kg/m  Physical Exam Vitals and nursing note reviewed.  Constitutional:      General: She is not in acute distress.    Appearance: She is well-developed. She is not ill-appearing, toxic-appearing or diaphoretic.  HENT:     Head: Normocephalic and atraumatic.  Eyes:     Conjunctiva/sclera: Conjunctivae normal.     Pupils: Pupils are equal, round, and reactive to light.  Cardiovascular:     Rate and Rhythm: Normal rate and regular rhythm.     Heart sounds: Murmur heard.  Pulmonary:     Effort: Pulmonary effort is normal. No respiratory distress.     Breath sounds: Normal breath sounds. No decreased breath sounds, wheezing, rhonchi or rales.  Chest:     Chest wall: Tenderness present.  Abdominal:     Palpations: Abdomen is soft.     Tenderness: There is no abdominal tenderness.  Musculoskeletal:        General: No swelling.     Cervical back: Neck supple.     Right lower leg: No tenderness. Edema present.     Left lower leg: No tenderness. Edema present.  Skin:    General: Skin is warm and dry.     Capillary Refill: Capillary refill takes less than 2 seconds.     Findings: No ecchymosis.  Neurological:     General: No focal deficit present.     Mental  Status: She is alert.  Psychiatric:        Mood and Affect: Mood normal.     ED Results / Procedures / Treatments   Labs (all labs ordered are listed, but only abnormal results are displayed) Labs Reviewed  BASIC METABOLIC PANEL - Abnormal; Notable for the following components:      Result Value   Glucose, Bld 213 (*)    BUN 32 (*)    Creatinine, Ser 1.36 (*)    GFR, Estimated 40 (*)    All other components within normal limits  CBC - Abnormal; Notable for the following components:   RBC 3.70 (*)    Hemoglobin 10.6 (*)    HCT 33.2 (*)    RDW 16.0 (*)  Platelets 131 (*)    All other components within normal limits  PROTIME-INR - Abnormal; Notable for the following components:   Prothrombin Time 17.7 (*)    INR 1.4 (*)    All other components within normal limits  LIPASE, BLOOD - Abnormal; Notable for the following components:   Lipase 54 (*)    All other components within normal limits  HEPATIC FUNCTION PANEL  TROPONIN I (HIGH SENSITIVITY)  TROPONIN I (HIGH SENSITIVITY)    EKG EKG Interpretation Date/Time:  Wednesday March 30 2023 17:49:00 EST Ventricular Rate:  73 PR Interval:  182 QRS Duration:  87 QT Interval:  399 QTC Calculation: 440 R Axis:   33  Text Interpretation: Sinus rhythm Low voltage, precordial leads Borderline repolarization abnormality when compared top rior, more wandering baseline No STEMI Confirmed by Theda Belfast [phone]) on 03/30/2023 5:58:59 PM  Radiology DG Chest 2 View  Result Date: 03/30/2023 CLINICAL DATA:  Left chest pain radiating to left neck. Shortness of breath. History of CHF. EXAM: CHEST - 2 VIEW COMPARISON:  09/04/2021. FINDINGS: The heart is enlarged and the mediastinal contour is within normal limits. There is atherosclerotic calcification of the aorta. The pulmonary vasculature is mildly distended. Lung volumes are low. No consolidation, effusion, or pneumothorax. Sternotomy wires are noted over the midline. A prosthetic  cardiac valve is seen. Surgical clips are present in the right upper quadrant. Degenerative changes are noted in the thoracic spine. No acute osseous abnormality. IMPRESSION: Cardiomegaly with mildly distended pulmonary vasculature. Electronically Signed   By: Thornell Sartorius M.D.   On: 03/30/2023 20:12    Procedures Procedures    Medications Ordered in ED Medications  nitroGLYCERIN (NITROSTAT) SL tablet 0.4 mg (0.4 mg Sublingual Given 03/30/23 1902)  ondansetron (ZOFRAN) injection 4 mg (has no administration in time range)  fentaNYL (SUBLIMAZE) injection 50 mcg (50 mcg Intravenous Given 03/30/23 2038)    ED Course/ Medical Decision Making/ A&P                                 Medical Decision Making Amount and/or Complexity of Data Reviewed Labs: ordered. Radiology: ordered.  Risk Prescription drug management. Decision regarding hospitalization.    TYNIA WIERS is a 77 y.o. female with a past medical history significant for CAD status post PCI and CABG, hyperlipidemia, mechanical aortic valve replacement on Coumadin therapy, osteoporosis, migraines, hypertension, and diabetes who presents with chest pain and shortness of breath.  According to patient, she has been doing well recently and today but after going to the doctor about 4 PM, she had onset of pressure and chest discomfort in her left central chest that radiated toward her left jaw and left neck.  Despite her previous cardiac problems she has never had chest pain with them before.  She reports she got lightheaded and short of breath.  She had some nausea but no syncope.  She denies diaphoresis.  Reports her left chest was slightly tender when she pushed on it and it was worse with some deep breathing.  She reports she does not exert herself due to the pain since then.  Reports the pain is an 8 out of 10 in severity and that is the worst it has been.  She has not taken medicine.  She reports no trauma.  No fevers, chills,  congestion, or cough.  Denies any constipation, diarrhea, or urinary changes.  Reports chronic edema that is not  different than her baseline.  On exam, lungs were clear.  She has a loud mechanical murmur.  Abdomen nontender.  Good pulse in extremities.  Legs have mild edema but she reports it is unchanged from baseline.  Left chest is tender to palpation slightly.  Neck nontender.  Back nontender.  Patient otherwise well-appearing.  EKG does not show STEMI.  Given the patient's history of cardiac disease with this chest pressure going towards her left jaw with nausea shortness of breath and lightheadedness I am somewhat concerned about a cardiac cause.  Will get cardiac enzymes, labs, chest x-ray, check an INR, and see what results show.  Anticipate discussion with cardiology as anticipate she will need admission for high risk chest pain workup was completed.  As she is on blood thinners is not tachycardic have low suspicion for thromboembolic disease at this time.  She also has a contrast allergy.  Will hold on PE workup initially.  8:39 PM First 2 troponins are normal.  Her nitro did help slightly but she did not want further doses.  Will give some fentanyl.  Spoke to cardiology given her story and history and discomfort and they feel she needs admission.  They recommend admission to [facility] where cardiology is more available but admit to medicine service.  Will call for medicine admission for concerning chest pain.         Final Clinical Impression(s) / ED Diagnoses Final diagnoses:  Chest pain, unspecified type    Clinical Impression: 1. Chest pain, unspecified type     Disposition: Admit  This note was prepared with assistance of Dragon voice recognition software. Occasional wrong-word or sound-a-like substitutions may have occurred due to the inherent limitations of voice recognition software.     Algernon Mundie, Canary Brim, MD 03/30/23 2340

## 2023-03-30 NOTE — ED Triage Notes (Signed)
Pt c/o L chest pain radiating into L neck and SOB starting around 1600.  Pain score 7/10.  Pt has not taken anything for pain.    Pt reports having a St Jude valve in 2000.

## 2023-03-30 NOTE — ED Notes (Signed)
Consult to Cardiology x 3 for EDP, [place].

## 2023-03-31 ENCOUNTER — Observation Stay (HOSPITAL_COMMUNITY): Payer: Medicare Other

## 2023-03-31 DIAGNOSIS — N1832 Chronic kidney disease, stage 3b: Secondary | ICD-10-CM | POA: Diagnosis not present

## 2023-03-31 DIAGNOSIS — Z79899 Other long term (current) drug therapy: Secondary | ICD-10-CM | POA: Diagnosis not present

## 2023-03-31 DIAGNOSIS — D631 Anemia in chronic kidney disease: Secondary | ICD-10-CM | POA: Diagnosis not present

## 2023-03-31 DIAGNOSIS — R0789 Other chest pain: Secondary | ICD-10-CM

## 2023-03-31 DIAGNOSIS — I130 Hypertensive heart and chronic kidney disease with heart failure and stage 1 through stage 4 chronic kidney disease, or unspecified chronic kidney disease: Secondary | ICD-10-CM | POA: Diagnosis not present

## 2023-03-31 DIAGNOSIS — I5032 Chronic diastolic (congestive) heart failure: Secondary | ICD-10-CM | POA: Diagnosis not present

## 2023-03-31 DIAGNOSIS — Z7982 Long term (current) use of aspirin: Secondary | ICD-10-CM | POA: Diagnosis not present

## 2023-03-31 DIAGNOSIS — Z7901 Long term (current) use of anticoagulants: Secondary | ICD-10-CM | POA: Diagnosis not present

## 2023-03-31 DIAGNOSIS — Z951 Presence of aortocoronary bypass graft: Secondary | ICD-10-CM | POA: Diagnosis not present

## 2023-03-31 DIAGNOSIS — D696 Thrombocytopenia, unspecified: Secondary | ICD-10-CM | POA: Diagnosis not present

## 2023-03-31 DIAGNOSIS — R072 Precordial pain: Secondary | ICD-10-CM | POA: Diagnosis not present

## 2023-03-31 DIAGNOSIS — I2510 Atherosclerotic heart disease of native coronary artery without angina pectoris: Secondary | ICD-10-CM | POA: Diagnosis not present

## 2023-03-31 DIAGNOSIS — R079 Chest pain, unspecified: Secondary | ICD-10-CM | POA: Diagnosis not present

## 2023-03-31 DIAGNOSIS — Z952 Presence of prosthetic heart valve: Secondary | ICD-10-CM | POA: Diagnosis not present

## 2023-03-31 DIAGNOSIS — E1122 Type 2 diabetes mellitus with diabetic chronic kidney disease: Secondary | ICD-10-CM | POA: Diagnosis not present

## 2023-03-31 LAB — CBG MONITORING, ED
Glucose-Capillary: 159 mg/dL — ABNORMAL HIGH (ref 70–99)
Glucose-Capillary: 135 mg/dL — ABNORMAL HIGH (ref 70–99)
Glucose-Capillary: 133 mg/dL — ABNORMAL HIGH (ref 70–99)

## 2023-03-31 LAB — BASIC METABOLIC PANEL WITH GFR
Anion gap: 11 (ref 5–15)
BUN: 29 mg/dL — ABNORMAL HIGH (ref 8–23)
CO2: 22 mmol/L (ref 22–32)
Calcium: 8.7 mg/dL — ABNORMAL LOW (ref 8.9–10.3)
Chloride: 105 mmol/L (ref 98–111)
Creatinine, Ser: 1.3 mg/dL — ABNORMAL HIGH (ref 0.44–1.00)
GFR, Estimated: 42 mL/min — ABNORMAL LOW
Glucose, Bld: 165 mg/dL — ABNORMAL HIGH (ref 70–99)
Potassium: 3.9 mmol/L (ref 3.5–5.1)
Sodium: 138 mmol/L (ref 135–145)

## 2023-03-31 LAB — CBC
HCT: 30.4 % — ABNORMAL LOW (ref 36.0–46.0)
Hemoglobin: 9.6 g/dL — ABNORMAL LOW (ref 12.0–15.0)
MCH: 28.6 pg (ref 26.0–34.0)
MCHC: 31.6 g/dL (ref 30.0–36.0)
MCV: 90.5 fL (ref 80.0–100.0)
Platelets: 103 10*3/uL — ABNORMAL LOW (ref 150–400)
RBC: 3.36 MIL/uL — ABNORMAL LOW (ref 3.87–5.11)
RDW: 16 % — ABNORMAL HIGH (ref 11.5–15.5)
WBC: 6.9 10*3/uL (ref 4.0–10.5)
nRBC: 0 % (ref 0.0–0.2)

## 2023-03-31 LAB — PROTIME-INR
INR: 1.4 — ABNORMAL HIGH (ref 0.8–1.2)
Prothrombin Time: 17.7 s — ABNORMAL HIGH (ref 11.4–15.2)

## 2023-03-31 LAB — GLUCOSE, CAPILLARY
Glucose-Capillary: 109 mg/dL — ABNORMAL HIGH (ref 70–99)
Glucose-Capillary: 118 mg/dL — ABNORMAL HIGH (ref 70–99)
Glucose-Capillary: 152 mg/dL — ABNORMAL HIGH (ref 70–99)

## 2023-03-31 LAB — HEPARIN LEVEL (UNFRACTIONATED)
Heparin Unfractionated: 0.23 [IU]/mL — ABNORMAL LOW (ref 0.30–0.70)
Heparin Unfractionated: 0.32 [IU]/mL (ref 0.30–0.70)

## 2023-03-31 LAB — TROPONIN I (HIGH SENSITIVITY)
Troponin I (High Sensitivity): 7 ng/L (ref <18)
Troponin I (High Sensitivity): 7 ng/L (ref <18)

## 2023-03-31 LAB — BRAIN NATRIURETIC PEPTIDE: B Natriuretic Peptide: 134.3 pg/mL — ABNORMAL HIGH (ref 0.0–100.0)

## 2023-03-31 LAB — LIPASE, BLOOD: Lipase: 42 U/L (ref 11–51)

## 2023-03-31 LAB — D-DIMER, QUANTITATIVE: D-Dimer, Quant: 0.33 ug{FEU}/mL (ref 0.00–0.50)

## 2023-03-31 MED ORDER — HYDROMORPHONE HCL 1 MG/ML IJ SOLN
0.5000 mg | INTRAMUSCULAR | Status: DC | PRN
  Administered 2023-03-31 (×2): 0.5 mg via INTRAVENOUS
  Filled 2023-03-31 (×2): qty 1

## 2023-03-31 MED ORDER — METOPROLOL SUCCINATE ER 50 MG PO TB24
50.0000 mg | ORAL_TABLET | Freq: Every day | ORAL | Status: DC
  Administered 2023-03-31 – 2023-04-01 (×2): 50 mg via ORAL
  Filled 2023-03-31 (×2): qty 1

## 2023-03-31 MED ORDER — KETOROLAC TROMETHAMINE 15 MG/ML IJ SOLN
15.0000 mg | Freq: Once | INTRAMUSCULAR | Status: AC
  Administered 2023-03-31: 15 mg via INTRAVENOUS
  Filled 2023-03-31: qty 1

## 2023-03-31 MED ORDER — LATANOPROST 0.005 % OP SOLN
1.0000 [drp] | Freq: Every day | OPHTHALMIC | Status: DC
  Administered 2023-03-31: 1 [drp] via OPHTHALMIC
  Filled 2023-03-31: qty 2.5

## 2023-03-31 MED ORDER — PANTOPRAZOLE SODIUM 40 MG IV SOLR
40.0000 mg | Freq: Two times a day (BID) | INTRAVENOUS | Status: DC
  Administered 2023-03-31 – 2023-04-01 (×3): 40 mg via INTRAVENOUS
  Filled 2023-03-31 (×3): qty 10

## 2023-03-31 MED ORDER — ACETAMINOPHEN 325 MG PO TABS: 650.0000 mg | ORAL_TABLET | Freq: Four times a day (QID) | ORAL | Status: DC | PRN

## 2023-03-31 MED ORDER — HEPARIN (PORCINE) 25000 UT/250ML-% IV SOLN
1050.0000 [IU]/h | INTRAVENOUS | Status: DC
  Administered 2023-03-31: 1050 [IU]/h via INTRAVENOUS
  Filled 2023-03-31: qty 250

## 2023-03-31 MED ORDER — WARFARIN - PHARMACIST DOSING INPATIENT: Freq: Every day | Status: DC

## 2023-03-31 MED ORDER — ROSUVASTATIN CALCIUM 5 MG PO TABS
10.0000 mg | ORAL_TABLET | Freq: Every evening | ORAL | Status: DC
  Administered 2023-03-31: 10 mg via ORAL
  Filled 2023-03-31: qty 2

## 2023-03-31 MED ORDER — NALOXONE HCL 0.4 MG/ML IJ SOLN: 0.4000 mg | INTRAMUSCULAR | Status: DC | PRN

## 2023-03-31 MED ORDER — HEPARIN BOLUS VIA INFUSION
1000.0000 [IU] | Freq: Once | INTRAVENOUS | Status: AC
  Administered 2023-03-31: 1000 [IU] via INTRAVENOUS
  Filled 2023-03-31: qty 1000

## 2023-03-31 MED ORDER — SERTRALINE HCL 50 MG PO TABS
50.0000 mg | ORAL_TABLET | Freq: Every day | ORAL | Status: DC
  Administered 2023-03-31 – 2023-04-01 (×2): 50 mg via ORAL
  Filled 2023-03-31 (×2): qty 1

## 2023-03-31 MED ORDER — ASPIRIN 81 MG PO TBEC
81.0000 mg | DELAYED_RELEASE_TABLET | Freq: Every evening | ORAL | Status: DC
  Administered 2023-03-31: 81 mg via ORAL
  Filled 2023-03-31: qty 1

## 2023-03-31 MED ORDER — HEPARIN (PORCINE) 25000 UT/250ML-% IV SOLN
1300.0000 [IU]/h | INTRAVENOUS | Status: DC
  Administered 2023-03-31: 1300 [IU]/h via INTRAVENOUS
  Filled 2023-03-31: qty 250

## 2023-03-31 MED ORDER — INSULIN ASPART 100 UNIT/ML IJ SOLN
0.0000 [IU] | INTRAMUSCULAR | Status: DC
  Administered 2023-03-31: 1 [IU] via SUBCUTANEOUS
  Administered 2023-03-31: 2 [IU] via SUBCUTANEOUS
  Administered 2023-03-31: 1 [IU] via SUBCUTANEOUS
  Administered 2023-03-31: 2 [IU] via SUBCUTANEOUS
  Administered 2023-04-01: 1 [IU] via SUBCUTANEOUS
  Filled 2023-03-31: qty 0.09

## 2023-03-31 MED ORDER — WARFARIN SODIUM 4 MG PO TABS
4.0000 mg | ORAL_TABLET | Freq: Once | ORAL | Status: AC
  Administered 2023-03-31: 4 mg via ORAL
  Filled 2023-03-31: qty 1

## 2023-03-31 MED ORDER — HEPARIN BOLUS VIA INFUSION
2000.0000 [IU] | Freq: Once | INTRAVENOUS | Status: AC
  Administered 2023-03-31: 2000 [IU] via INTRAVENOUS
  Filled 2023-03-31: qty 2000

## 2023-03-31 MED ORDER — ACETAMINOPHEN 650 MG RE SUPP: 650.0000 mg | Freq: Four times a day (QID) | RECTAL | Status: DC | PRN

## 2023-03-31 NOTE — ED Notes (Signed)
[facility] called. 

## 2023-03-31 NOTE — Progress Notes (Signed)
PHARMACY - ANTICOAGULATION CONSULT NOTE  Pharmacy Consult for Heparin & Warfarin Indication: Mechanical Aortic Valve  Allergies  Allergen Reactions   Atorvastatin     Muscle pain   Dapagliflozin     Other reaction(s): nausea   Gabapentin     Other reaction(s): stomach issues   Simvastatin Other (See Comments)    Muscle pain   Sulfa Antibiotics     unknown   Iodinated Contrast Media Rash     Red rash after cardiac cath 1 wk ago, ? Contrast allergy, requires 13 hr prep now per dr.[email or web address]//a.calhoun      Patient Measurements: Height: 5\' 2"  (157.5 cm) Weight: 79.8 kg (176 lb) IBW/kg (Calculated) : 50.1 Heparin Dosing Weight: 67 kg  Vital Signs: Temp: 99.4 F (37.4 C) (11/06 2154) Temp Source: Oral (11/06 2154) BP: 129/51 (11/07 0030) Pulse Rate: 83 (11/07 0030)  Labs: Recent Labs    03/30/23 1805 03/30/23 1947  HGB 10.6*  --   HCT 33.2*  --   PLT 131*  --   LABPROT 17.7*  --   INR 1.4*  --   CREATININE 1.36*  --   TROPONINIHS 7 7    Estimated Creatinine Clearance: 33.9 mL/min (A) (by C-G formula based on SCr of 1.36 mg/dL (H)).   Medical History: Past Medical History:  Diagnosis Date   Anemia    Anxiety    Aortic stenosis    a. Now has St Jude mechanical aortic valve (6/00). b. Echo (6/15) with EF 60-65%, mechanical aortic valve with mean gradient 27 mmHg.   Arthritis    a. Possible c-spine arthritis with pain down left arm.     Carotid artery disease (HCC)    a. Carotid dopplers (7/16) with 40-59% BICA stenosis.     Chronic angle-closure glaucoma(365.23)    Chronic diastolic CHF (congestive heart failure) (HCC)    a.  RHC (7/15) with mean RA 2, PA 22/11, mean PCWP 9, CI 3.79.   Contrast media allergy    Coronary atherosclerosis of native coronary artery    a. CABG at time of AVR in 6/00 with RIMA-RCA. b. Abnl nuc 11/2013 -> LHC (7/15) with patent RIMA-RCA, 80% mRCA, 80% pLAD with FFR 0.73, treated with DES to pLAD.   Depression    Essential  hypertension    GERD (gastroesophageal reflux disease)    Hyperlipidemia    Low back pain    Neuromuscular disorder (HCC)    neuropathy   Peripheral vascular disease (HCC)    a, H/o Right SFA stent. b. Peripheral arterial dopplers (7/16) with right SFA stent patent.     PONV (postoperative nausea and vomiting)    N&V   Type II diabetes mellitus (HCC)     Medications:  PTA Warfarin 1.5mg  daily except 3mg  Sun/Tue/Thur Last warfarin dose taken 03/29/23 @ 2200  Assessment: 77 yr female with c/o chest pain PMH significant for CAD s/p CABG, St Jude mechanical aoritic valve, HF, HTN, HLD, PVD, DM, CKD IV heparin to be started due to subtherapeutic INR  Goal of Therapy:  Heparin level 0.3-0.7 INR 2-3 (INR goal noted in cardiology office visit 12/22/22) Monitor platelets by anticoagulation protocol: Yes   Plan:  Due to subtherapeutic INR with no warfarn taken on 11/6, will start IV heparin with small bolus Heparin 2000 unit IV bolus x 1 Heparin gtt @ 1050 units/hr Warfarin 4mg  po x 1 dose (will give at 8am today since no warfarin taken on 11/6) Check heparn level 8 hr after  infusion started Daily PT/INR, HL, CBC Monitor for signs & symptoms of bleeding  Nevaya Nagele, Joselyn Glassman, PharmD 03/31/2023,2:39 AM

## 2023-03-31 NOTE — ED Notes (Signed)
[facility] given report.  

## 2023-03-31 NOTE — Progress Notes (Signed)
Pt said she is having pains of 7/10 on the right side of her chest. She also said that she does not want any pain meds or nitro because they have been giving it to her where she is coming from but it doesn't help with the pain. Physician made aware.

## 2023-03-31 NOTE — Progress Notes (Signed)
This is a pleasant 77 year old lady with many comorbidities as mentioned in H&P was admitted to hospital service after midnight with chest pain.  All labs, data has been reviewed by myself.  ACS has been ruled out with negative cardiac enzymes but patient continues to have chest pain, worse with deep inspiration, sharp and at times pressure-like, 7 out of 10.  She has some shortness of breath but no other complaints.  Patient is admitted to [facility] and is pending transfer while waiting in the ED at [facility] per recommendations from cardiology.  She remains on heparin drip.  Medically stable.  On examination, lungs clear to auscultation and she appears very comfortable.  Please review H&P for details.

## 2023-03-31 NOTE — Progress Notes (Signed)
PHARMACY - ANTICOAGULATION CONSULT NOTE  Pharmacy Consult for Heparin, Warfarin Indication: chest pain/ACS, mechanical AVR  Allergies  Allergen Reactions   Atorvastatin     Muscle pain   Dapagliflozin     Other reaction(s): nausea   Gabapentin     Other reaction(s): stomach issues   Simvastatin Other (See Comments)    Muscle pain   Sulfa Antibiotics     unknown   Iodinated Contrast Media Rash     Red rash after cardiac cath 1 wk ago, ? Contrast allergy, requires 13 hr prep now per dr.gallerani//a.calhoun      Patient Measurements: Height: 5\' 2"  (157.5 cm) Weight: 79.8 kg (176 lb) IBW/kg (Calculated) : 50.1 Heparin Dosing Weight: 68 kg  Vital Signs: Temp: 97.9 F (36.6 C) (11/07 0949) Temp Source: Oral (11/07 0949) BP: 137/118 (11/07 0939) Pulse Rate: 72 (11/07 0939)  Labs: Recent Labs    03/30/23 1805 03/30/23 1947 03/31/23 0253 03/31/23 0441 03/31/23 0606  HGB 10.6*  --   --  9.6*  --   HCT 33.2*  --   --  30.4*  --   PLT 131*  --   --  103*  --   LABPROT 17.7*  --   --  17.7*  --   INR 1.4*  --   --  1.4*  --   CREATININE 1.36*  --   --  1.30*  --   TROPONINIHS 7 7 7   --  7    Estimated Creatinine Clearance: 35.5 mL/min (A) (by C-G formula based on SCr of 1.3 mg/dL (H)).   Medical History: Past Medical History:  Diagnosis Date   Anemia    Anxiety    Aortic stenosis    a. Now has St Jude mechanical aortic valve (6/00). b. Echo (6/15) with EF 60-65%, mechanical aortic valve with mean gradient 27 mmHg.   Arthritis    a. Possible c-spine arthritis with pain down left arm.     Carotid artery disease (HCC)    a. Carotid dopplers (7/16) with 40-59% BICA stenosis.     Chronic angle-closure glaucoma(365.23)    Chronic diastolic CHF (congestive heart failure) (HCC)    a.  RHC (7/15) with mean RA 2, PA 22/11, mean PCWP 9, CI 3.79.   Contrast media allergy    Coronary atherosclerosis of native coronary artery    a. CABG at time of AVR in 6/00 with  RIMA-RCA. b. Abnl nuc 11/2013 -> LHC (7/15) with patent RIMA-RCA, 80% mRCA, 80% pLAD with FFR 0.73, treated with DES to pLAD.   Depression    Essential hypertension    GERD (gastroesophageal reflux disease)    Hyperlipidemia    Low back pain    Neuromuscular disorder (HCC)    neuropathy   Peripheral vascular disease (HCC)    a, H/o Right SFA stent. b. Peripheral arterial dopplers (7/16) with right SFA stent patent.     PONV (postoperative nausea and vomiting)    N&V   Type II diabetes mellitus (HCC)     Medications:  Scheduled Meds:  aspirin EC  81 mg Oral QPM   insulin aspart  0-9 Units Subcutaneous Q4H   latanoprost  1 drop Both Eyes QHS   metoprolol succinate  50 mg Oral Daily   ondansetron (ZOFRAN) IV  4 mg Intravenous Once   pantoprazole (PROTONIX) IV  40 mg Intravenous Q12H   rosuvastatin  10 mg Oral QPM   sertraline  50 mg Oral Daily   [START  ON 04/01/2023] Warfarin - Pharmacist Dosing Inpatient   Does not apply q1600   Continuous Infusions:  heparin 1,050 Units/hr (03/31/23 0252)     Assessment: 45 yoF presents to ED on 11/6 due to chest pain.  PMH is significant for chronic warfarin anticoagulation for history of St Jude mechanical aortic valve replacement, as well as CAD s/p CABG, HF, HTN, HLD, PVD, DM, CKD.  Pharmacy is consulted to dose warfarin and bridge with Heparin IV while INR is subtherapeutic.    PTA warfarin dose 3mg  on Tues, Thurs, Sat, Sundays and 1.5 mg on Mon, Wed, Fridays.  Last dose taken on 11/5 at 22:00.  Admit INR 1.4, subtherapeutic.  Patient reported that she hasn't missed any doses, started any new medications, or changed her dietary vitamin K intake.     Today, 03/31/2023:  INR 1.4, subtherapeutic Heparin level 0.23, subtherapeutic on heparin 1050 units/hr CBC:  Hgb decreased to 9.6, Plt down to 103k No bleeding or complications reported.  SCr 1.3, LFTs WNL  Goal of Therapy:  Heparin level 0.3-0.7 units/ml INR 2.5-3.5  (INR goal per  anticoag clinic notes) Monitor platelets by anticoagulation protocol: Yes   Plan:  Warfarin dose already given 11/7 AM  Give heparin 1000 units bolus IV x 1 Increase to heparin IV infusion at 1200 units/hr Heparin level in 8 hours after rate change Daily heparin level and CBC  Lynann Beaver PharmD, BCPS [facility] main pharmacy [phone] 03/31/2023 12:48 PM

## 2023-03-31 NOTE — Progress Notes (Signed)
PHARMACY - ANTICOAGULATION CONSULT NOTE  Pharmacy Consult for Heparin, Warfarin Indication: chest pain/ACS, mechanical AVR  Allergies  Allergen Reactions   Atorvastatin     Muscle pain   Dapagliflozin     Other reaction(s): nausea   Gabapentin     Other reaction(s): stomach issues   Simvastatin Other (See Comments)    Muscle pain   Sulfa Antibiotics     unknown   Iodinated Contrast Media Rash     Red rash after cardiac cath 1 wk ago, ? Contrast allergy, requires 13 hr prep now per dr.[email or web address]//a.calhoun      Patient Measurements: Height: 5\' 2"  (157.5 cm) Weight: 79.8 kg (176 lb) IBW/kg (Calculated) : 50.1 Heparin Dosing Weight: 68 kg  Vital Signs: Temp: 99 F (37.2 C) (11/07 2028) Temp Source: Oral (11/07 2028) BP: 118/59 (11/07 2028) Pulse Rate: 71 (11/07 2028)  Labs: Recent Labs    03/30/23 1805 03/30/23 1947 03/31/23 0253 03/31/23 0441 03/31/23 0606 03/31/23 1100 03/31/23 2041  HGB 10.6*  --   --  9.6*  --   --   --   HCT 33.2*  --   --  30.4*  --   --   --   PLT 131*  --   --  103*  --   --   --   LABPROT 17.7*  --   --  17.7*  --   --   --   INR 1.4*  --   --  1.4*  --   --   --   HEPARINUNFRC  --   --   --   --   --  0.23* 0.32  CREATININE 1.36*  --   --  1.30*  --   --   --   TROPONINIHS 7 7 7   --  7  --   --     Estimated Creatinine Clearance: 35.5 mL/min (A) (by C-G formula based on SCr of 1.3 mg/dL (H)).   Medical History: Past Medical History:  Diagnosis Date   Anemia    Anxiety    Aortic stenosis    a. Now has St Jude mechanical aortic valve (6/00). b. Echo (6/15) with EF 60-65%, mechanical aortic valve with mean gradient 27 mmHg.   Arthritis    a. Possible c-spine arthritis with pain down left arm.     Carotid artery disease (HCC)    a. Carotid dopplers (7/16) with 40-59% BICA stenosis.     Chronic angle-closure glaucoma(365.23)    Chronic diastolic CHF (congestive heart failure) (HCC)    a.  RHC (7/15) with mean RA 2, PA 22/11,  mean PCWP 9, CI 3.79.   Contrast media allergy    Coronary atherosclerosis of native coronary artery    a. CABG at time of AVR in 6/00 with RIMA-RCA. b. Abnl nuc 11/2013 -> LHC (7/15) with patent RIMA-RCA, 80% mRCA, 80% pLAD with FFR 0.73, treated with DES to pLAD.   Depression    Essential hypertension    GERD (gastroesophageal reflux disease)    Hyperlipidemia    Low back pain    Neuromuscular disorder (HCC)    neuropathy   Peripheral vascular disease (HCC)    a, H/o Right SFA stent. b. Peripheral arterial dopplers (7/16) with right SFA stent patent.     PONV (postoperative nausea and vomiting)    N&V   Type II diabetes mellitus (HCC)       Assessment: 25 yoF presents to ED on 11/6 due to chest  pain.  PMH is significant for chronic warfarin anticoagulation for history of St Jude mechanical aortic valve replacement, as well as CAD s/p CABG, HF, HTN, HLD, PVD, DM, CKD.  Pharmacy is consulted to dose warfarin and bridge with Heparin IV while INR is subtherapeutic.    PTA warfarin dose 3mg  on Tues, Thurs, Sat, Sundays and 1.5 mg on Mon, Wed, Fridays.  Last dose taken on 11/5 at 22:00.  Admit INR 1.4, subtherapeutic.  Patient reported that she hasn't missed any doses, started any new medications, or changed her dietary vitamin K intake.    Heparin level 0.32 is at low end of therapeutic on 1200 units/hr.  No issues with infusion or bleeding per RN. Will increase to keep at higher end of goal range given INR goal.   Goal of Therapy:  Heparin level 0.3-0.7 units/ml INR 2.5-3.5  (INR goal per anticoag clinic notes) Monitor platelets by anticoagulation protocol: Yes   Plan:  Warfarin dose already given 11/7 AM   Increase heparin to 1300 units/hr Monitor daily INR, heparin level, CBC, signs/symptoms of bleeding   Alphia Moh, PharmD, BCPS, [facility] Clinical Pharmacist  Please check AMION for all [facility] Pharmacy phone numbers After 10:00 PM, call Main Pharmacy [phone]

## 2023-03-31 NOTE — Consult Note (Signed)
Cardiology Consultation   Patient ID: Abigail BARTOLOTTA MRN: [id number]; DOB: Jan 20, 1946  Admit date: 03/30/2023 Date of Consult: 03/31/2023  PCP:  [name], NP   [facility] Providers Cardiologist:  Dr Scharlene Gloss   Patient Profile:   Abigail Oliver is a 77 y.o. female with a hx of coronary artery disease status post coronary bypass and graft, prior aortic valve replacement, chronic stage III kidney disease, heart failure preserved ejection fraction, hyperlipidemia who is being seen 03/31/2023 for the evaluation of chest pain at the request of Stephania Fragmin MD.  History of Present Illness:   Patient has had previous coronary artery bypass and graft with RIMA to the PAD and aortic valve replacement (St Jude aortic valve) at that time.  She has also had PCI of her LAD.  Cardiac catheterization April 2023 showed 30% proximal LAD, 50% mid LAD, 40% distal left main and occluded RCA; patent proximal LAD stent and patent RIMA to the PDA.  Medical therapy recommended.  Most recent echocardiogram April 2024 showed normal LV function, mild left ventricular hypertrophy, severe left atrial enlargement, mild mitral regurgitation, status post aortic valve replacement with trace aortic insufficiency and mean gradient 15.7 mmHg.  Patient also with peripheral vascular disease with prior right SFA angioplasty.  Patient developed left-sided chest pain Tuesday at 4 PM.  The pain was in the left chest area radiating to her shoulder.  It was described as a burning pain that increased with certain movements, deep inspiration and palpation.  It has been persistent without completely resolving.  She describes some dyspnea associated with the symptoms as it "hurts to take a deep breath".  Also with mild nausea but no diaphoresis.  No orthopnea, PND or pedal edema.  Prior to this she was not having significant dyspnea on exertion and no exertional chest pain.  She has not had syncope.  No fevers,  chills, productive cough or hemoptysis.   Past Medical History:  Diagnosis Date   Anemia    Anxiety    Aortic stenosis    a. Now has St Jude mechanical aortic valve (6/00). b. Echo (6/15) with EF 60-65%, mechanical aortic valve with mean gradient 27 mmHg.   Arthritis    a. Possible c-spine arthritis with pain down left arm.     Carotid artery disease (HCC)    a. Carotid dopplers (7/16) with 40-59% BICA stenosis.     Chronic angle-closure glaucoma(365.23)    Chronic diastolic CHF (congestive heart failure) (HCC)    a.  RHC (7/15) with mean RA 2, PA 22/11, mean PCWP 9, CI 3.79.   Contrast media allergy    Coronary atherosclerosis of native coronary artery    a. CABG at time of AVR in 6/00 with RIMA-RCA. b. Abnl nuc 11/2013 -> LHC (7/15) with patent RIMA-RCA, 80% mRCA, 80% pLAD with FFR 0.73, treated with DES to pLAD.   Depression    Essential hypertension    GERD (gastroesophageal reflux disease)    Hyperlipidemia    Low back pain    Neuromuscular disorder (HCC)    neuropathy   Peripheral vascular disease (HCC)    a, H/o Right SFA stent. b. Peripheral arterial dopplers (7/16) with right SFA stent patent.     PONV (postoperative nausea and vomiting)    N&V   Type II diabetes mellitus [facility])     Past Surgical History:  Procedure Laterality Date   BILATERAL OOPHORECTOMY  05/24/1985   BREAST BIOPSY  05/24/2010  left   CARDIAC CATHETERIZATION  01/14/1999   normal LV function, severe aortic stenosis; 80% and 70% stenosis in RCA; mild 20% distal norrowing in L main with 20% proximal LAD stenosis, 40% diagonal stenosis and 20% proximal circumflex stenosis   CARDIAC VALVE REPLACEMENT  05/24/1998   aortic valve    CARDIOVASCULAR STRESS TEST  08/25/2011   R/L MV - EF 72%; no scintigraphic evidence of inducible MI; normal perfusionTID of 1.25 elevated - could indicate small vessle subendocardial ischemia; EKG NSR at 66, non diagnostic for ischemia   CARPAL TUNNEL RELEASE  05/25/1987    bilaterally   CATARACT EXTRACTION W/ INTRAOCULAR LENS  IMPLANT, BILATERAL  ~ 2007   CESAREAN SECTION  1969; 1971   CHOLECYSTECTOMY  05/24/1988   COLONOSCOPY N/A 10/27/2012   Procedure: COLONOSCOPY;  Surgeon: Theda Belfast, MD;  Location: [facility] END[facility]COPY;  Service: Endoscopy;  Laterality: N/A;   CORONARY ARTERY BYPASS GRAFT  05/24/1998   RIMA to RCA   CORONARY/GRAFT ANGIOGRAPHY N/A 09/07/2021   Procedure: CORONARY/GRAFT ANGIOGRAPHY;  Surgeon: Orbie Pyo, MD;  Location: [facility] INVASIVE CV LAB;  Service: Cardiovascular;  Laterality: N/A;   DOPPLER ECHOCARDIOGRAPHY  03/29/2012   EF >55%; mild concentric LVH; stage 1 diastolic dysfunction, elevated LV filling pressure, dilated LA; MAC mild MR; St Jude AVR peak and mean gradients of and ; transvalvular gradients have increased (prev 23 and 14 respectively)   ESOPHAGOGASTRODUODENOSCOPY N/A 10/27/2012   Procedure: ESOPHAGOGASTRODUODENOSCOPY (EGD);  Surgeon: Theda Belfast, MD;  Location: [facility];  Service: Endoscopy;  Laterality: N/A;   FEMORAL ARTERY STENT  09/20/2011   6 x 40 Smart Nitinol self-expanding stent placed;  10/15/2031 -R SFA stent open and patent w/o evidence of restenosis   FRACTIONAL FLOW RESERVE WIRE  12/14/2013   Procedure: FRACTIONAL FLOW RESERVE WIRE;  Surgeon: Laurey Morale, MD;  [facility] CATH LAB;  Service: Cardiovascular;;   KNEE SURGERY Left    LACERATION REPAIR     right hand   LEFT AND RIGHT HEART CATHETERIZATION WITH CORONARY ANGIOGRAM N/A 12/14/2013   Procedure: LEFT AND RIGHT HEART CATHETERIZATION WITH CORONARY ANGIOGRAM;  Surgeon: Laurey Morale, MD;  Location: [facility] CATH LAB;  Service: Cardiovascular;  Laterality: N/A;   LOWER EXTREMITY ANGIOGRAM N/A 09/20/2011   Procedure: LOWER EXTREMITY ANGIOGRAM;  Surgeon: Runell Gess, MD;  Location: [facility] CATH LAB;  Service: Cardiovascular;  Laterality: N/A;   LYMPH NODE BIOPSY  06/25/2011   "core needle on 5"   needle biopsy  05/25/2007   "on ankles for  nerve damage"   NEUROPLASTY / TRANSPOSITION ULNAR NERVE AT ELBOW     right   ORIF FEMUR FRACTURE Left 09/09/2021   Procedure: OPEN REDUCTION INTERNAL FIXATION (ORIF) DISTAL FEMUR FRACTURE;  Surgeon: Roby Lofts, MD;  Location: [facility] OR;  Service: Orthopedics;  Laterality: Left;   PERCUTANEOUS CORONARY STENT INTERVENTION (PCI-S)  12/14/2013   Procedure: PERCUTANEOUS CORONARY STENT INTERVENTION (PCI-S);  Surgeon: Laurey Morale, MD;  Location: [facility] CATH LAB;  Service: Cardiovascular;;  Prox LAD 3.00x12 Promus DES    PERIPHERAL ARTERIAL STENT GRAFT  09/20/2011   right SFA   REFRACTIVE SURGERY  ` 2004   "for glaucoma"   TONSILLECTOMY  05/24/1966   TRIGGER FINGER RELEASE Left 12/04/2015   Procedure: LEFT RING FINGER TRIGGER RELEASE;  Surgeon: Betha Loa, MD;  Location: [facility];  Service: Orthopedics;  Laterality: Left;   VAGINAL HYSTERECTOMY  05/25/1983   Fibroids      Inpatient  Medications: Scheduled Meds:  aspirin EC  81 mg Oral QPM   insulin aspart  0-9 Units Subcutaneous Q4H   latanoprost  1 drop Both Eyes QHS   metoprolol succinate  50 mg Oral Daily   ondansetron (ZOFRAN) IV  4 mg Intravenous Once   pantoprazole (PROTONIX) IV  40 mg Intravenous Q12H   rosuvastatin  10 mg Oral QPM   sertraline  50 mg Oral Daily   [START ON 04/01/2023] Warfarin - Pharmacist Dosing Inpatient   Does not apply q1600   Continuous Infusions:  heparin 1,200 Units/hr (03/31/23 1254)   PRN Meds: acetaminophen **OR** acetaminophen, HYDROmorphone (DILAUDID) injection, naLOXone (NARCAN)  injection, nitroGLYCERIN  Allergies:    Allergies  Allergen Reactions   Atorvastatin     Muscle pain   Dapagliflozin     Other reaction(s): nausea   Gabapentin     Other reaction(s): stomach issues   Simvastatin Other (See Comments)    Muscle pain   Sulfa Antibiotics     unknown   Iodinated Contrast Media Rash     Red rash after cardiac cath 1 wk ago, ? Contrast allergy, requires 13 hr prep now  per dr.[email or web address]//[email or web address]      Social History:   Social History   Socioeconomic History   Marital status: Married    Spouse name: Jomarie Longs   Number of children: 2   Years of education: 14   Highest education level: Not on file  Occupational History    Comment: hair dresser  Tobacco Use   Smoking status: Never   Smokeless tobacco: Never  Vaping Use   Vaping status: Never Used  Substance and Sexual Activity   Alcohol use: Not Currently   Drug use: Not Currently   Sexual activity: Never  Other Topics Concern   Not on file  Social History Narrative   Pt is a high school graduate with 2 years of college. Married in 1967 she has 1 son born 36 and 1 daughter born 59 and 1 grandchild. 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      Caffeine use- 2-3 /day, soda, tea       Lives with husband one story home   Social Determinants of Health   Financial Resource Strain: Not on file  Food Insecurity: No Food Insecurity (03/31/2023)   Hunger Vital Sign    Worried About Running Out of Food in the Last Year: Never true    Ran Out of Food in the Last Year: Never true  Transportation Needs: No Transportation Needs (03/31/2023)   PRAPARE - Administrator, Civil Service (Medical): No    Lack of Transportation (Non-Medical): No  Physical Activity: Not on file  Stress: Not on file  Social Connections: Not on file  Intimate Partner Violence: Not At Risk (03/31/2023)   Humiliation, Afraid, Rape, and Kick questionnaire    Fear of Current or Ex-Partner: No    Emotionally Abused: No    Physically Abused: No    Sexually Abused: No    Family History:    Family History  Problem Relation Age of Onset   Colon cancer Mother    COPD Mother    Emphysema Mother    Cancer Maternal Grandmother    Cancer Maternal Grandfather    Heart disease Brother    Diabetes Neg Hx      ROS:  Please see the history of present illness.  No fevers, chills, productive  cough, hemoptysis.  All other ROS reviewed and negative.     Physical Exam/Data:   Vitals:   03/31/23 0949 03/31/23 1100 03/31/23 1203 03/31/23 1401  BP:  (!) 124/56 (!) 141/99 (!) 113/51  Pulse:  75 69 69  Resp:  16 19 20   Temp: 97.9 F (36.6 C)  97.9 F (36.6 C) 99.2 F (37.3 C)  TempSrc: Oral  Oral Oral  SpO2:  98% 98% 98%  Weight:      Height:        Intake/Output Summary (Last 24 hours) at 03/31/2023 1751 Last data filed at 03/31/2023 1256 Gross per 24 hour  Intake 125.44 ml  Output --  Net 125.44 ml      03/30/2023    5:52 PM 02/16/2023    8:58 AM 12/22/2022    9:00 AM  Last 3 Weights  Weight (lbs) 176 lb 176 lb 169 lb  Weight (kg) 79.833 kg 79.833 kg 76.658 kg     Body mass index is 32.19 kg/m.  General:  Well nourished, well developed, in no acute distress HEENT: normal Neck: no JVD Vascular: No carotid bruits; Distal pulses 2+ bilaterally Cardiac:  normal S1, S2; RRR; crisp mechanical valve sound.  2/6 systolic murmur.  No diastolic murmur. Lungs:  clear to auscultation bilaterally, no wheezing, rhonchi or rales.  Chest pain is reproduced with palpation. Abd: soft, nontender, no hepatomegaly  Ext: no edema Musculoskeletal:  No deformities, BUE and BLE strength normal and equal Skin: warm and dry  Neuro:  CNs 2-12 intact, no focal abnormalities noted Psych:  Normal affect   EKG:  The EKG was personally reviewed and demonstrates: Normal sinus rhythm, nonspecific ST changes. Telemetry:  Telemetry was personally reviewed and demonstrates: Normal sinus rhythm  Laboratory Data:  High Sensitivity Troponin:   Recent Labs  Lab 03/30/23 1805 03/30/23 1947 03/31/23 0253 03/31/23 0606  TROPONINIHS 7 7 7 7      Chemistry Recent Labs  Lab 03/30/23 1805 03/31/23 0441  NA 139 138  K 3.8 3.9  CL 105 105  CO2 25 22  GLUCOSE 213* 165*  BUN 32* 29*  CREATININE 1.36* 1.30*  CALCIUM 9.0 8.7*  GFRNONAA 40* 42*  ANIONGAP 9 11    Recent Labs  Lab  03/30/23 1805  PROT 7.2  ALBUMIN 4.0  AST 38  ALT 27  ALKPHOS 64  BILITOT 0.9   Hematology Recent Labs  Lab 03/30/23 1805 03/31/23 0441  WBC 8.5 6.9  RBC 3.70* 3.36*  HGB 10.6* 9.6*  HCT 33.2* 30.4*  MCV 89.7 90.5  MCH 28.6 28.6  MCHC 31.9 31.6  RDW 16.0* 16.0*  PLT 131* 103*    BNP Recent Labs  Lab 03/31/23 0441  BNP 134.3*    DDimer  Recent Labs  Lab 03/31/23 0253  DDIMER 0.33     Radiology/Studies:  DG Chest 2 View  Result Date: 03/30/2023 CLINICAL DATA:  Left chest pain radiating to left neck. Shortness of breath. History of CHF. EXAM: CHEST - 2 VIEW COMPARISON:  09/04/2021. FINDINGS: The heart is enlarged and the mediastinal contour is within normal limits. There is atherosclerotic calcification of the aorta. The pulmonary vasculature is mildly distended. Lung volumes are low. No consolidation, effusion, or pneumothorax. Sternotomy wires are noted over the midline. A prosthetic cardiac valve is seen. Surgical clips are present in the right upper quadrant. Degenerative changes are noted in the thoracic spine. No acute osseous abnormality. IMPRESSION: Cardiomegaly with mildly distended pulmonary vasculature. Electronically Signed   By: Vernona Rieger  Ladona Ridgel M.D.   On: 03/30/2023 20:12     Assessment and Plan:   Chest pain-patient symptoms are extremely atypical and I do not think consistent with cardiac pain.  Her pain has been persistent for greater than 48 hours.  Her electrocardiogram shows no diagnostic ST changes and her troponins are normal.  She is therefore ruled out.  Her pain also increases with certain movements, inspiration and is reproduced with palpation.  I think it is likely musculoskeletal.  I do not think she requires further cardiac evaluation.  Further management of musculoskeletal pain per primary care. Coronary artery disease status post coronary bypass and graft-continue aspirin 81 mg daily and Crestor. Status post aortic valve replacement-continue  SBE prophylaxis.  Continue Coumadin with goal INR 2-3. Hyperlipidemia-continue statin. Peripheral vascular disease-continue statin. Heart failure preserved EF-patient is euvolemic.  Continue Demadex at present dose. Anemia-chronic.  Follow-up primary care. 8 chronic stage III kidney disease  [facility] will sign off.   Medication Recommendations: Continue preadmission medications. Other recommendations (labs, testing, etc): No additional cardiac workup recommended. Follow up as an outpatient: Will arrange follow-up with Dr. Bufford Buttner 3 months.  For questions or updates, please contact [facility] Please consult [email or web address] for contact info under    Signed, Olga Millers, MD  03/31/2023 5:51 PM

## 2023-03-31 NOTE — H&P (Signed)
History and Physical    NATOYA VISCOMI ZOX:[id number] DOB: May 11, 1946 DOA: 03/30/2023  PCP: [name], NP  Patient coming from: Home  Chief Complaint: Chest pain  HPI: CIA GARRETSON is a 77 y.o. female with medical history significant of CAD status post CABG and PCI, aortic stenosis status post mechanical AVR on warfarin, chronic HFpEF, hypertension, hyperlipidemia, GERD, PVD, carotid artery disease, type 2 diabetes, CKD stage 3b, anemia of chronic disease, anxiety, depression, glaucoma presented to ED with a chief complaint of left-sided chest pain.  Not hypoxic.  Labs showing hemoglobin 10.6 (stable), platelet count 131k (stable), glucose 213, bicarb 25, creatinine 1.3 (stable), troponin negative x 2, INR 1.4, lipase 54, LFTs normal.  Chest x-ray showing cardiomegaly with mildly distended pulmonary vasculature. EDP consulted cardiology (Dr. Lajean Manes) who recommended echo and admission at [facility].  Patient was given fentanyl and sublingual nitroglycerin.  TRH called to admit.  Patient states she was feeling well in the morning, went shopping.  She then had lab work done around 4 PM at [facility] to check her kidney function.  Soon after she had lab work done, she started feeling weak and dizzy.  As she was walking to her car, she experienced left-sided burning chest pain which radiated to her neck and associated with shortness of breath and nausea.  Patient states her chest pain is worse when taking deep breaths and has not improved much after receiving fentanyl and sublingual nitroglycerin in the ED.  Denies abdominal pain.  She is on warfarin due to history of mechanical aortic valve and denies missing any doses.  She is currently on warfarin 3 mg MWF and 1.5 mg the remaining 4 days of the week.  Denies any dietary changes.  Denies history of blood clots.  Review of Systems:  Review of Systems  All other systems reviewed and are negative.   Past Medical History:   Diagnosis Date   Anemia    Anxiety    Aortic stenosis    a. Now has St Jude mechanical aortic valve (6/00). b. Echo (6/15) with EF 60-65%, mechanical aortic valve with mean gradient 27 mmHg.   Arthritis    a. Possible c-spine arthritis with pain down left arm.     Carotid artery disease (HCC)    a. Carotid dopplers (7/16) with 40-59% BICA stenosis.     Chronic angle-closure glaucoma(365.23)    Chronic diastolic CHF (congestive heart failure) (HCC)    a.  RHC (7/15) with mean RA 2, PA 22/11, mean PCWP 9, CI 3.79.   Contrast media allergy    Coronary atherosclerosis of native coronary artery    a. CABG at time of AVR in 6/00 with RIMA-RCA. b. Abnl nuc 11/2013 -> LHC (7/15) with patent RIMA-RCA, 80% mRCA, 80% pLAD with FFR 0.73, treated with DES to pLAD.   Depression    Essential hypertension    GERD (gastroesophageal reflux disease)    Hyperlipidemia    Low back pain    Neuromuscular disorder (HCC)    neuropathy   Peripheral vascular disease (HCC)    a, H/o Right SFA stent. b. Peripheral arterial dopplers (7/16) with right SFA stent patent.     PONV (postoperative nausea and vomiting)    N&V   Type II diabetes mellitus [facility])     Past Surgical History:  Procedure Laterality Date   BILATERAL OOPHORECTOMY  05/24/1985   BREAST BIOPSY  05/24/2010   left   CARDIAC CATHETERIZATION  01/14/1999  normal LV function, severe aortic stenosis; 80% and 70% stenosis in RCA; mild 20% distal norrowing in L main with 20% proximal LAD stenosis, 40% diagonal stenosis and 20% proximal circumflex stenosis   CARDIAC VALVE REPLACEMENT  05/24/1998   aortic valve    CARDIOVASCULAR STRESS TEST  08/25/2011   R/L MV - EF 72%; no scintigraphic evidence of inducible MI; normal perfusionTID of 1.25 elevated - could indicate small vessle subendocardial ischemia; EKG NSR at 66, non diagnostic for ischemia   CARPAL TUNNEL RELEASE  05/25/1987   bilaterally   CATARACT EXTRACTION W/ INTRAOCULAR LENS  IMPLANT,  BILATERAL  ~ 2007   CESAREAN SECTION  1969; 1971   CHOLECYSTECTOMY  05/24/1988   COLONOSCOPY N/A 10/27/2012   Procedure: COLONOSCOPY;  Surgeon: Theda Belfast, MD;  Location: [facility] ENDOSCOPY;  Service: Endoscopy;  Laterality: N/A;   CORONARY ARTERY BYPASS GRAFT  05/24/1998   RIMA to RCA   CORONARY/GRAFT ANGIOGRAPHY N/A 09/07/2021   Procedure: CORONARY/GRAFT ANGIOGRAPHY;  Surgeon: Orbie Pyo, MD;  Location: [facility] INVASIVE CV LAB;  Service: Cardiovascular;  Laterality: N/A;   DOPPLER ECHOCARDIOGRAPHY  03/29/2012   EF >55%; mild concentric LVH; stage 1 diastolic dysfunction, elevated LV filling pressure, dilated LA; MAC mild MR; St Jude AVR peak and mean gradients of and ; transvalvular gradients have increased (prev 23 and 14 respectively)   ESOPHAGOGASTRODUODENOSCOPY N/A 10/27/2012   Procedure: ESOPHAGOGASTRODUODENOSCOPY (EGD);  Surgeon: Theda Belfast, MD;  Location: [facility];  Service: Endoscopy;  Laterality: N/A;   FEMORAL ARTERY STENT  09/20/2011   6 x 40 Smart Nitinol self-expanding stent placed;  10/15/2031 -R SFA stent open and patent w/o evidence of restenosis   FRACTIONAL FLOW RESERVE WIRE  12/14/2013   Procedure: FRACTIONAL FLOW RESERVE WIRE;  Surgeon: Laurey Morale, MD;  Location: [facility] CATH LAB;  Service: Cardiovascular;;   KNEE SURGERY Left    LACERATION REPAIR     right hand   LEFT AND RIGHT HEART CATHETERIZATION WITH CORONARY ANGIOGRAM N/A 12/14/2013   Procedure: LEFT AND RIGHT HEART CATHETERIZATION WITH CORONARY ANGIOGRAM;  Surgeon: Laurey Morale, MD;  Location: [facility] CATH LAB;  Service: Cardiovascular;  Laterality: N/A;   LOWER EXTREMITY ANGIOGRAM N/A 09/20/2011   Procedure: LOWER EXTREMITY ANGIOGRAM;  Surgeon: Runell Gess, MD;  Location: [facility] CATH LAB;  Service: Cardiovascular;  Laterality: N/A;   LYMPH NODE BIOPSY  06/25/2011   "core needle on 5"   needle biopsy  05/25/2007   "on ankles for nerve damage"   NEUROPLASTY / TRANSPOSITION ULNAR NERVE AT ELBOW      right   ORIF FEMUR FRACTURE Left 09/09/2021   Procedure: OPEN REDUCTION INTERNAL FIXATION (ORIF) DISTAL FEMUR FRACTURE;  Surgeon: Roby Lofts, MD;  Location: [facility] OR;  Service: Orthopedics;  Laterality: Left;   PERCUTANEOUS CORONARY STENT INTERVENTION (PCI-S)  12/14/2013   Procedure: PERCUTANEOUS CORONARY STENT INTERVENTION (PCI-S);  Surgeon: Laurey Morale, MD;  Location: [facility] CATH LAB;  Service: Cardiovascular;;  Prox LAD 3.00x12 Promus DES    PERIPHERAL ARTERIAL STENT GRAFT  09/20/2011   right SFA   REFRACTIVE SURGERY  ` 2004   "for glaucoma"   TONSILLECTOMY  05/24/1966   TRIGGER FINGER RELEASE Left 12/04/2015   Procedure: LEFT RING FINGER TRIGGER RELEASE;  Surgeon: Betha Loa, MD;  Location: [facility];  Service: Orthopedics;  Laterality: Left;   VAGINAL HYSTERECTOMY  05/25/1983   Fibroids     reports that she has never smoked. She has never used smokeless  tobacco. She reports that she does not currently use alcohol. She reports that she does not currently use drugs.  Allergies  Allergen Reactions   Atorvastatin     Muscle pain   Dapagliflozin     Other reaction(s): nausea   Gabapentin     Other reaction(s): stomach issues   Simvastatin Other (See Comments)    Muscle pain   Sulfa Antibiotics     unknown   Iodinated Contrast Media Rash     Red rash after cardiac cath 1 wk ago, ? Contrast allergy, requires 13 hr prep now per [email or web address]      Family History  Problem Relation Age of Onset   Colon cancer Mother    COPD Mother    Emphysema Mother    Cancer Maternal Grandmother    Cancer Maternal Grandfather    Heart disease Brother    Diabetes Neg Hx     Prior to Admission medications   Medication Sig Start Date End Date Taking? Authorizing Provider  Ascorbic Acid (VITAMIN C) 1000 MG tablet Take 1,000 mg by mouth every evening.   Yes [provider]  aspirin EC 81 MG tablet Take 81 mg by mouth every evening. 02/21/15  Yes Laurey Morale, MD  Cholecalciferol (VITAMIN D3 PO) Take 1,000 Units by mouth daily.   Yes [provider]  cyanocobalamin (VITAMIN B12) 1000 MCG tablet Take 1,000 mcg by mouth daily.   Yes [provider]  fluticasone (FLONASE) 50 MCG/ACT nasal spray Place 2 sprays into both nostrils daily. Patient taking differently: Place 2 sprays into both nostrils daily as needed for allergies. 08/05/22  Yes Medina-Vargas, Monina C, NP  folic acid (FOLVITE) 800 MCG tablet Take 800 mcg by mouth daily.   Yes [provider]  hydrOXYzine (VISTARIL) 25 MG capsule Take 1 capsule (25 mg total) by mouth at bedtime as needed. 12/10/22  Yes Medina-Vargas, Monina C, NP  latanoprost (XALATAN) 0.005 % ophthalmic solution PLACE 1 DROP INTO BOTH EYES AT BEDTIME. 07/25/14  Yes Myrlene Broker, MD  melatonin 5 MG TABS Take 1 tablet (5 mg total) by mouth at bedtime. 02/16/23  Yes Medina-Vargas, Monina C, NP  metoprolol succinate (TOPROL-XL) 25 MG 24 hr tablet Take 50 mg by mouth daily.   Yes [provider]  Multiple Vitamin (MULTIVITAMIN WITH MINERALS) TABS tablet Take 2 tablets by mouth daily.   Yes [provider]  potassium chloride SA (KLOR-CON M) 20 MEQ tablet Take 1 tablet (20 mEq total) by mouth daily. 12/13/22  Yes Medina-Vargas, Monina C, NP  rosuvastatin (CRESTOR) 10 MG tablet Take 10 mg by mouth every evening.   Yes [provider]  sertraline (ZOLOFT) 50 MG tablet Take 1 tablet (50 mg total) by mouth daily. 12/10/22  Yes Medina-Vargas, Monina C, NP  torsemide (DEMADEX) 20 MG tablet Take 1 tablet (20 mg total) by mouth daily. 12/13/22  Yes Medina-Vargas, Monina C, NP  valsartan (DIOVAN) 80 MG tablet 1 tablet Orally Once a day 09/07/22  Yes [provider]  warfarin (COUMADIN) 3 MG tablet TAKE 1/2 TO 1 TABLET BY MOUTH DAILY AS DIRECTED Patient taking differently: Take 1.5-3 mg by mouth as directed. TAKE 1 TABLET (3 MG) ON SUN TUES THURS & TAKE 1/2 TABLET (1.5 MG)  ALL OTHER DAYS 11/08/22  Yes Runell Gess, MD  cetirizine (ZYRTEC) 10 MG tablet Take 1 tablet (10 mg total) by mouth at bedtime. Patient not taking: Reported on 03/30/2023 08/05/22   Medina-Vargas, [employer]  C, NP  metoprolol succinate (TOPROL-XL) 50 MG 24 hr tablet Take 1 tablet (50 mg total) by mouth daily. Take with or immediately following a meal. Patient not taking: Reported on 03/30/2023 03/30/23 06/28/23  Sande Rives, MD  pantoprazole (PROTONIX) 40 MG tablet TAKE ONE TABLET BY MOUTH DAILY Patient not taking: Reported on 03/30/2023 09/28/21   Esperanza Richters, PA-C    Physical Exam: Vitals:   03/30/23 2300 03/30/23 2330 03/31/23 0000 03/31/23 0030  BP: (!) 120/58 (!) 129/54 (!) 133/59 (!) 129/51  Pulse: 88 88 84 83  Resp: (!) 26 16 (!) 28 18  Temp:      TempSrc:      SpO2: 96% 97% 96% 97%  Weight:      Height:        Physical Exam Vitals reviewed.  Constitutional:      General: She is not in acute distress.    Comments: Resting comfortably  HENT:     Head: Normocephalic and atraumatic.  Eyes:     Extraocular Movements: Extraocular movements intact.  Cardiovascular:     Rate and Rhythm: Normal rate and regular rhythm.     Pulses: Normal pulses.     Heart sounds: Murmur heard.  Pulmonary:     Effort: Pulmonary effort is normal. No respiratory distress.     Breath sounds: Normal breath sounds. No wheezing or rales.  Abdominal:     General: Bowel sounds are normal. There is no distension.     Palpations: Abdomen is soft.     Tenderness: There is no abdominal tenderness.  Musculoskeletal:     Cervical back: Normal range of motion.     Right lower leg: No edema.     Left lower leg: No edema.  Skin:    General: Skin is warm and dry.  Neurological:     General: No focal deficit present.     Mental Status: She is alert and oriented to person, place, and time.     Labs on Admission: I have personally reviewed following labs and imaging studies  CBC: Recent Labs   Lab 03/30/23 1805  WBC 8.5  HGB 10.6*  HCT 33.2*  MCV 89.7  PLT 131*   Basic Metabolic Panel: Recent Labs  Lab 03/30/23 1805  NA 139  K 3.8  CL 105  CO2 25  GLUCOSE 213*  BUN 32*  CREATININE 1.36*  CALCIUM 9.0   GFR: Estimated Creatinine Clearance: 33.9 mL/min (A) (by C-G formula based on SCr of 1.36 mg/dL (H)). Liver Function Tests: Recent Labs  Lab 03/30/23 1805  AST 38  ALT 27  ALKPHOS 64  BILITOT 0.9  PROT 7.2  ALBUMIN 4.0   Recent Labs  Lab 03/30/23 1805  LIPASE 54*   No results for input(s): "AMMONIA" in the last 168 hours. Coagulation Profile: Recent Labs  Lab 03/30/23 1805  INR 1.4*   Cardiac Enzymes: No results for input(s): "CKTOTAL", "CKMB", "CKMBINDEX", "TROPONINI" in the last 168 hours. BNP (last 3 results) No results for input(s): "PROBNP" in the last 8760 hours. HbA1C: No results for input(s): "HGBA1C" in the last 72 hours. CBG: No results for input(s): "GLUCAP" in the last 168 hours. Lipid Profile: No results for input(s): "CHOL", "HDL", "LDLCALC", "TRIG", "CHOLHDL", "LDLDIRECT" in the last 72 hours. Thyroid Function Tests: No results for input(s): "TSH", "T4TOTAL", "FREET4", "T3FREE", "THYROIDAB" in the last 72 hours. Anemia Panel: No results for input(s): "VITAMINB12", "FOLATE", "FERRITIN", "TIBC", "IRON", "RETICCTPCT" in the last 72 hours. Urine analysis:  Component Value Date/Time   COLORURINE YELLOW 10/21/2012 1058   APPEARANCEUR CLOUDY (A) 10/21/2012 1058   LABSPEC 1.008 10/21/2012 1058   PHURINE 7.0 10/21/2012 1058   GLUCOSEU NEGATIVE 10/21/2012 1058   GLUCOSEU NEGATIVE 04/26/2011 1634   HGBUR NEGATIVE 10/21/2012 1058   BILIRUBINUR NEGATIVE 10/21/2012 1058   KETONESUR NEGATIVE 10/21/2012 1058   PROTEINUR NEGATIVE 10/21/2012 1058   UROBILINOGEN 0.2 10/21/2012 1058   NITRITE NEGATIVE 10/21/2012 1058   LEUKOCYTESUR TRACE (A) 10/21/2012 1058    Radiological Exams on Admission: DG Chest 2 View  Result Date:  03/30/2023 CLINICAL DATA:  Left chest pain radiating to left neck. Shortness of breath. History of CHF. EXAM: CHEST - 2 VIEW COMPARISON:  09/04/2021. FINDINGS: The heart is enlarged and the mediastinal contour is within normal limits. There is atherosclerotic calcification of the aorta. The pulmonary vasculature is mildly distended. Lung volumes are low. No consolidation, effusion, or pneumothorax. Sternotomy wires are noted over the midline. A prosthetic cardiac valve is seen. Surgical clips are present in the right upper quadrant. Degenerative changes are noted in the thoracic spine. No acute osseous abnormality. IMPRESSION: Cardiomegaly with mildly distended pulmonary vasculature. Electronically Signed   By: Thornell Sartorius M.D.   On: 03/30/2023 20:12    EKG: Independently reviewed.  Sinus rhythm, baseline wander, and no obvious STEMI.  Assessment and Plan  Chest pain History of CAD status post CABG and PCI Patient is presenting with left-sided exertional burning chest pain radiating to the neck and associated with dyspnea and nausea.  Chest pain is worse when taking deep breaths.  Troponin negative x 2 and not consistent with ACS. Chest x-ray showing cardiomegaly with mildly distended pulmonary vasculature.  She is on warfarin due to history of mechanical aortic valve but INR subtherapeutic.?Acute PE but not tachycardic or hypoxic.  Best to avoid CTA chest due to contrast allergy and CKD.  Patient reports no improvement of her chest pain after receiving fentanyl and sublingual nitroglycerin in the ED.  Continue pain management with IV Dilaudid as needed.  Continue cardiac monitoring.  Start IV heparin given subtherapeutic INR and pharmacy consulted for warfarin dosing.  Continue to trend troponin.  Check D-dimer, if elevated, VQ scan in the morning to rule out PE.  Also echocardiogram has been ordered.  Cardiology was consulted by EDP.  Continue aspirin, metoprolol, and Crestor.  Aortic stenosis status  post mechanical AVR INR subtherapeutic at 1.4.  Patient denies missing any doses of warfarin and denies any dietary changes.  Started on IV heparin and pharmacy consulted for warfarin dosing.  Chronic HFpEF Last echo done in April 2024 showing EF 60 to 65%.  Does not appear volume overloaded on exam.  Check BNP.  Hypertension SBP currently in the 120s.  Continue metoprolol.  Hyperlipidemia Continue Crestor.  History of GERD Question whether this is contributing to her chest pain as she describes it as burning in nature.  Start IV Protonix 40 mg every 12 hours.  PVD Carotid artery disease Continue aspirin and statin.  Type 2 diabetes Glucose 213.  Last A1c 6.5 on 02/16/2023.  Sensitive sliding scale insulin ordered.  CKD stage IIIb Creatinine 1.3, stable.  Continue to monitor renal function.  Anemia of chronic disease Hemoglobin stable, continue to monitor labs.  Mild chronic thrombocytopenia Platelet count stable, continue to monitor labs.  Anxiety and depression Continue Zoloft.  Code Status: Full Code (discussed with the patient) Level of care: Progressive Care Unit Admission status: It is my clinical opinion that  referral for OBSERVATION is reasonable and necessary in this patient based on the above information provided. The aforementioned taken together are felt to place the patient at high risk for further clinical deterioration. However, it is anticipated that the patient may be medically stable for discharge from the hospital within 24 to 48 hours.  John Giovanni MD [facility]  If 7PM-7AM, please contact night-coverage [email or web address]  03/31/2023, 12:48 AM

## 2023-03-31 NOTE — ED Notes (Signed)
ED TO INPATIENT HANDOFF REPORT  Name/Age/Gender Abigail Oliver 77 y.o. female  Code Status    Code Status Orders  (From admission, onward)           Start     Ordered   03/31/23 0210  Full code  Continuous       Question:  By:  Answer:  Consent: discussion documented in EHR   03/31/23 0215           Code Status History     Date Active Date Inactive Code Status Order ID Comments User Context   09/04/2021 1402 09/12/2021 1759 Full Code [id number]  Jonah Blue, MD ED   12/14/2013 1201 12/15/2013 1415 Full Code [id number]  Kathleene Hazel, MD Inpatient   10/29/2013 2048 10/31/2013 1809 Full Code [id number]  Doree Albee, MD ED   10/21/2012 1503 10/24/2012 2203 Full Code [id number]  Kela Millin, MD Inpatient      Advance Directive Documentation    Flowsheet Row Most Recent Value  Type of Advance Directive Living will, Healthcare Power of Attorney  Pre-existing out of facility DNR order (yellow form or pink MOST form) --  "MOST" Form in Place? --       Home/SNF/Other Home  Chief Complaint Chest pain [R07.9]  Level of Care/Admitting Diagnosis ED Disposition     ED Disposition  Admit   Condition  --   Comment  Hospital Area: [facility] [[id number]]  Level of Care: Progressive [102]  Admit to Progressive based on following criteria: CARDIOVASCULAR & THORACIC of moderate stability with acute coronary syndrome symptoms/low risk myocardial infarction/hypertensive urgency/arrhythmias/heart failure potentially compromising stability and stable post cardiovascular intervention patients.  May place patient in observation at [facility] or [facility] if equivalent level of care is available:: Yes  Covid Evaluation: Asymptomatic - no recent exposure (last 10 days) testing not required  Diagnosis: Chest pain [324401]  Admitting Physician: John Giovanni [[id number]]  Attending Physician: John Giovanni [[id number]]          Medical  History Past Medical History:  Diagnosis Date   Anemia    Anxiety    Aortic stenosis    a. Now has [employer] Jude mechanical aortic valve (6/00). b. Echo (6/15) with EF 60-65%, mechanical aortic valve with mean gradient 27 mmHg.   Arthritis    a. Possible c-spine arthritis with pain down left arm.     Carotid artery disease (HCC)    a. Carotid dopplers (7/16) with 40-59% BICA stenosis.     Chronic angle-closure glaucoma(365.23)    Chronic diastolic CHF (congestive heart failure) (HCC)    a.  RHC (7/15) with mean RA 2, PA 22/11, mean PCWP 9, CI 3.79.   Contrast media allergy    Coronary atherosclerosis of native coronary artery    a. CABG at time of AVR in 6/00 with RIMA-RCA. b. Abnl nuc 11/2013 -> LHC (7/15) with patent RIMA-RCA, 80% mRCA, 80% pLAD with FFR 0.73, treated with DES to pLAD.   Depression    Essential hypertension    GERD (gastroesophageal reflux disease)    Hyperlipidemia    Low back pain    Neuromuscular disorder (HCC)    neuropathy   Peripheral vascular disease (HCC)    a, H/o Right SFA stent. b. Peripheral arterial dopplers (7/16) with right SFA stent patent.     PONV (postoperative nausea and vomiting)    N&V   Type II diabetes mellitus (HCC)     Allergies Allergies  Allergen Reactions   Atorvastatin     Muscle pain   Dapagliflozin     Other reaction(s): nausea   Gabapentin     Other reaction(s): stomach issues   Simvastatin Other (See Comments)    Muscle pain   Sulfa Antibiotics     unknown   Iodinated Contrast Media Rash     Red rash after cardiac cath 1 wk ago, ? Contrast allergy, requires 13 hr prep now per [email or web address]      IV Location/Drains/Wounds Patient Lines/Drains/Airways Status     Active Line/Drains/Airways     Name Placement date Placement time Site Days   Peripheral IV 03/30/23 20 G Anterior;Left Forearm 03/30/23  1801  Forearm  1            Labs/Imaging Results for orders placed or performed during the hospital  encounter of 03/30/23 (from the past 48 hour(s))  Basic metabolic panel     Status: Abnormal   Collection Time: 03/30/23  6:05 PM  Result Value Ref Range   Sodium 139 135 - 145 mmol/L   Potassium 3.8 3.5 - 5.1 mmol/L   Chloride 105 98 - 111 mmol/L   CO2 25 22 - 32 mmol/L   Glucose, Bld 213 (H) 70 - 99 mg/dL    Comment: Glucose reference range applies only to samples taken after fasting for at least 8 hours.   BUN 32 (H) 8 - 23 mg/dL   Creatinine, Ser 0.27 (H) 0.44 - 1.00 mg/dL   Calcium 9.0 8.9 - 25.3 mg/dL   GFR, Estimated 40 (L) >60 mL/min    Comment: (NOTE) Calculated using the CKD-EPI Creatinine Equation (2021)    Anion gap 9 5 - 15    Comment: Performed at [facility], [address], [place], [place] [address]  CBC     Status: Abnormal   Collection Time: 03/30/23  6:05 PM  Result Value Ref Range   WBC 8.5 4.0 - 10.5 K/uL   RBC 3.70 (L) 3.87 - 5.11 MIL/uL   Hemoglobin 10.6 (L) 12.0 - 15.0 g/dL   HCT 34.7 (L) 42.5 - 95.6 %   MCV 89.7 80.0 - 100.0 fL   MCH 28.6 26.0 - 34.0 pg   MCHC 31.9 30.0 - 36.0 g/dL   RDW 38.7 (H) 56.4 - 33.2 %   Platelets 131 (L) 150 - 400 K/uL   nRBC 0.0 0.0 - 0.2 %    Comment: Performed at [facility], [address] [place], [place] [address]  Troponin I (High Sensitivity)     Status: None   Collection Time: 03/30/23  6:05 PM  Result Value Ref Range   Troponin I (High Sensitivity) 7 <18 ng/L    Comment: (NOTE) Elevated high sensitivity troponin I (hsTnI) values and significant  changes across serial measurements may suggest ACS but many other  chronic and acute conditions are known to elevate hsTnI results.  Refer to the "Links" section for chest pain algorithms and additional  guidance. Performed at [facility], [address], [place], [place] [address]   Protime-INR (order if Patient is taking Coumadin / Warfarin)     Status: Abnormal   Collection Time: 03/30/23  6:05 PM   Result Value Ref Range   Prothrombin Time 17.7 (H) 11.4 - 15.2 seconds   INR 1.4 (H) 0.8 - 1.2    Comment: (NOTE) INR goal varies based on device and disease states. Performed at [facility], [address] [place], [place] [address]   Lipase, blood     Status: Abnormal   Collection Time: 03/30/23  6:05 PM  Result Value Ref Range   Lipase 54 (H) 11 - 51 U/L    Comment: Performed at [facility], [address] [place], [place] [address]  Hepatic function panel     Status: None   Collection Time: 03/30/23  6:05 PM  Result Value Ref Range   Total Protein 7.2 6.5 - 8.1 g/dL   Albumin 4.0 3.5 - 5.0 g/dL   AST 38 15 - 41 U/L   ALT 27 0 - 44 U/L   Alkaline Phosphatase 64 38 - 126 U/L   Total Bilirubin 0.9 <1.2 mg/dL   Bilirubin, Direct 0.2 0.0 - 0.2 mg/dL   Indirect Bilirubin 0.7 0.3 - 0.9 mg/dL    Comment: Performed at [facility], [address] [place], [place] [address]  Troponin I (High Sensitivity)     Status: None   Collection Time: 03/30/23  7:47 PM  Result Value Ref Range   Troponin I (High Sensitivity) 7 <18 ng/L    Comment: (NOTE) Elevated high sensitivity troponin I (hsTnI) values and significant  changes across serial measurements may suggest ACS but many other  chronic and acute conditions are known to elevate hsTnI results.  Refer to the "Links" section for chest pain algorithms and additional  guidance. Performed at [facility], [address], [place], [place] [address]   Troponin I (High Sensitivity)     Status: None   Collection Time: 03/31/23  2:53 AM  Result Value Ref Range   Troponin I (High Sensitivity) 7 <18 ng/L    Comment: (NOTE) Elevated high sensitivity troponin I (hsTnI) values and significant  changes across serial measurements may suggest ACS but many other  chronic and acute conditions are known to elevate hsTnI results.  Refer to the "Links" section for chest  pain algorithms and additional  guidance. Performed at [facility], [address], [place], [place] [address]   D-dimer, quantitative     Status: None   Collection Time: 03/31/23  2:53 AM  Result Value Ref Range   D-Dimer, Quant 0.33 0.00 - 0.50 ug/mL-FEU    Comment: (NOTE) At the manufacturer cut-off value of 0.5 g/mL FEU, this assay has a negative predictive value of 95-100%.This assay is intended for use in conjunction with a clinical pretest probability (PTP) assessment model to exclude pulmonary embolism (PE) and deep venous thrombosis (DVT) in outpatients suspected of PE or DVT. Results should be correlated with clinical presentation. Performed at [facility], [address] [place], [place] [address]   CBG monitoring, ED     Status: Abnormal   Collection Time: 03/31/23  4:39 AM  Result Value Ref Range   Glucose-Capillary 159 (H) 70 - 99 mg/dL    Comment: Glucose reference range applies only to samples taken after fasting for at least 8 hours.  CBC     Status: Abnormal   Collection Time: 03/31/23  4:41 AM  Result Value Ref Range   WBC 6.9 4.0 - 10.5 K/uL   RBC 3.36 (L) 3.87 - 5.11 MIL/uL   Hemoglobin 9.6 (L) 12.0 - 15.0 g/dL   HCT 36.1 (L) 44.3 - 15.4 %   MCV 90.5 80.0 - 100.0 fL   MCH 28.6 26.0 - 34.0 pg   MCHC 31.6 30.0 - 36.0 g/dL   RDW 00.8 (H) 67.6 -  15.5 %   Platelets 103 (L) 150 - 400 K/uL   nRBC 0.0 0.0 - 0.2 %    Comment: Performed at [facility], [address] [place], [place] [address]  Basic metabolic panel     Status: Abnormal   Collection Time: 03/31/23  4:41 AM  Result Value Ref Range   Sodium 138 135 - 145 mmol/L   Potassium 3.9 3.5 - 5.1 mmol/L   Chloride 105 98 - 111 mmol/L   CO2 22 22 - 32 mmol/L   Glucose, Bld 165 (H) 70 - 99 mg/dL    Comment: Glucose reference range applies only to samples taken after fasting for at least 8 hours.   BUN 29 (H) 8 - 23 mg/dL   Creatinine, Ser 6.21  (H) 0.44 - 1.00 mg/dL   Calcium 8.7 (L) 8.9 - 10.3 mg/dL   GFR, Estimated 42 (L) >60 mL/min    Comment: (NOTE) Calculated using the CKD-EPI Creatinine Equation (2021)    Anion gap 11 5 - 15    Comment: Performed at [facility], [address] [place], [place] [address]  Lipase, blood     Status: None   Collection Time: 03/31/23  4:41 AM  Result Value Ref Range   Lipase 42 11 - 51 U/L    Comment: Performed at [facility], [address] [place], [place] [address]  Protime-INR     Status: Abnormal   Collection Time: 03/31/23  4:41 AM  Result Value Ref Range   Prothrombin Time 17.7 (H) 11.4 - 15.2 seconds   INR 1.4 (H) 0.8 - 1.2    Comment: (NOTE) INR goal varies based on device and disease states. Performed at [facility], [address] [place], [place] [address]   Brain natriuretic peptide     Status: Abnormal   Collection Time: 03/31/23  4:41 AM  Result Value Ref Range   B Natriuretic Peptide 134.3 (H) 0.0 - 100.0 pg/mL    Comment: Performed at [facility], [address], [place], [place] [address]  Troponin I (High Sensitivity)     Status: None   Collection Time: 03/31/23  6:06 AM  Result Value Ref Range   Troponin I (High Sensitivity) 7 <18 ng/L    Comment: (NOTE) Elevated high sensitivity troponin I (hsTnI) values and significant  changes across serial measurements may suggest ACS but many other  chronic and acute conditions are known to elevate hsTnI results.  Refer to the "Links" section for chest pain algorithms and additional  guidance. Performed at [facility], [address], [place], [place] [address]   CBG monitoring, ED     Status: Abnormal   Collection Time: 03/31/23  7:44 AM  Result Value Ref Range   Glucose-Capillary 135 (H) 70 - 99 mg/dL    Comment: Glucose reference range applies only to samples taken after fasting for at least 8 hours.  CBG  monitoring, ED     Status: Abnormal   Collection Time: 03/31/23 11:41 AM  Result Value Ref Range   Glucose-Capillary 133 (H) 70 - 99 mg/dL    Comment: Glucose reference range applies only to samples taken after fasting for at least 8 hours.   *Note: Due to a large number of results and/or encounters for the requested time period, some results have not been displayed. A complete set of results can be found in Results Review.   DG Chest 2 View  Result Date: 03/30/2023 CLINICAL DATA:  Left chest pain radiating to left neck. Shortness of breath. History of CHF. EXAM: CHEST - 2 VIEW COMPARISON:  09/04/2021. FINDINGS: The heart is enlarged and the mediastinal contour is within normal limits. There is atherosclerotic calcification of the aorta. The pulmonary vasculature is mildly distended. Lung volumes are low. No consolidation, effusion, or pneumothorax. Sternotomy wires are noted over the midline. A prosthetic cardiac valve is seen. Surgical clips are present in the right upper quadrant. Degenerative changes are noted in the thoracic spine. No acute osseous abnormality. IMPRESSION: Cardiomegaly with mildly distended pulmonary vasculature. Electronically Signed   By: Abigail Sartorius M.D.   On: 03/30/2023 20:12    Pending Labs Unresulted Labs (From admission, onward)     Start     Ordered   04/01/23 0500  Protime-INR  Daily,   R      03/31/23 0220   04/01/23 0500  CBC  Daily,   R      03/31/23 0220   03/31/23 1100  Heparin level (unfractionated)  Once-Timed,   TIMED        03/31/23 0245            Vitals/Pain Today's Vitals   03/31/23 0949 03/31/23 0950 03/31/23 1100 03/31/23 1203  BP:   (!) 124/56 (!) 141/99  Pulse:   75 69  Resp:   16 19  Temp: 97.9 F (36.6 C)   97.9 F (36.6 C)  TempSrc: Oral   Oral  SpO2:   98% 98%  Weight:      Height:      PainSc:  8       Isolation Precautions No active isolations  Medications Medications  nitroGLYCERIN (NITROSTAT) SL tablet 0.4 mg  (0.4 mg Sublingual Given 03/30/23 1902)  ondansetron (ZOFRAN) injection 4 mg (0 mg Intravenous Hold 03/30/23 2343)  aspirin EC tablet 81 mg (has no administration in time range)  latanoprost (XALATAN) 0.005 % ophthalmic solution 1 drop (has no administration in time range)  metoprolol succinate (TOPROL-XL) 24 hr tablet 50 mg (50 mg Oral Given 03/31/23 0939)  rosuvastatin (CRESTOR) tablet 10 mg (has no administration in time range)  sertraline (ZOLOFT) tablet 50 mg (50 mg Oral Given 03/31/23 0939)  acetaminophen (TYLENOL) tablet 650 mg (has no administration in time range)    Or  acetaminophen (TYLENOL) suppository 650 mg (has no administration in time range)  insulin aspart (novoLOG) injection 0-9 Units (1 Units Subcutaneous Given 03/31/23 1151)  pantoprazole (PROTONIX) injection 40 mg (40 mg Intravenous Given 03/31/23 0237)  HYDROmorphone (DILAUDID) injection 0.5 mg (0.5 mg Intravenous Given 03/31/23 0950)  naloxone (NARCAN) injection 0.4 mg (has no administration in time range)  heparin bolus via infusion 2,000 Units (2,000 Units Intravenous Bolus from Bag 03/31/23 0252)    Followed by  heparin ADULT infusion 100 units/mL (25000 units/248mL) (1,050 Units/hr Intravenous Rate/Dose Verify 03/31/23 1146)  Warfarin - Pharmacist Dosing Inpatient (has no administration in time range)  fentaNYL (SUBLIMAZE) injection 50 mcg (50 mcg Intravenous Given 03/30/23 2038)  warfarin (COUMADIN) tablet 4 mg (4 mg Oral Given 03/31/23 0940)    Mobility walks with device

## 2023-04-01 ENCOUNTER — Other Ambulatory Visit (HOSPITAL_COMMUNITY): Payer: Self-pay

## 2023-04-01 ENCOUNTER — Observation Stay (HOSPITAL_COMMUNITY): Payer: Medicare Other

## 2023-04-01 ENCOUNTER — Telehealth: Payer: Self-pay | Admitting: Cardiovascular Disease

## 2023-04-01 ENCOUNTER — Ambulatory Visit: Payer: Medicare Other

## 2023-04-01 DIAGNOSIS — I10 Essential (primary) hypertension: Secondary | ICD-10-CM

## 2023-04-01 DIAGNOSIS — E785 Hyperlipidemia, unspecified: Secondary | ICD-10-CM | POA: Diagnosis not present

## 2023-04-01 DIAGNOSIS — R0789 Other chest pain: Secondary | ICD-10-CM | POA: Diagnosis not present

## 2023-04-01 DIAGNOSIS — I5032 Chronic diastolic (congestive) heart failure: Secondary | ICD-10-CM | POA: Diagnosis not present

## 2023-04-01 DIAGNOSIS — R079 Chest pain, unspecified: Secondary | ICD-10-CM | POA: Diagnosis not present

## 2023-04-01 LAB — GLUCOSE, CAPILLARY
Glucose-Capillary: 131 mg/dL — ABNORMAL HIGH (ref 70–99)
Glucose-Capillary: 144 mg/dL — ABNORMAL HIGH (ref 70–99)
Glucose-Capillary: 148 mg/dL — ABNORMAL HIGH (ref 70–99)

## 2023-04-01 LAB — CBC
HCT: 28.9 % — ABNORMAL LOW (ref 36.0–46.0)
Hemoglobin: 8.9 g/dL — ABNORMAL LOW (ref 12.0–15.0)
MCH: 26.9 pg (ref 26.0–34.0)
MCHC: 30.8 g/dL (ref 30.0–36.0)
MCV: 87.3 fL (ref 80.0–100.0)
Platelets: 84 10*3/uL — ABNORMAL LOW (ref 150–400)
RBC: 3.31 MIL/uL — ABNORMAL LOW (ref 3.87–5.11)
RDW: 15.9 % — ABNORMAL HIGH (ref 11.5–15.5)
WBC: 5.1 10*3/uL (ref 4.0–10.5)
nRBC: 0 % (ref 0.0–0.2)

## 2023-04-01 LAB — PROTIME-INR
INR: 1.4 — ABNORMAL HIGH (ref 0.8–1.2)
Prothrombin Time: 17.8 s — ABNORMAL HIGH (ref 11.4–15.2)

## 2023-04-01 LAB — HEPARIN LEVEL (UNFRACTIONATED): Heparin Unfractionated: 0.46 [IU]/mL (ref 0.30–0.70)

## 2023-04-01 MED ORDER — WARFARIN SODIUM 2 MG PO TABS: 4.0000 mg | ORAL_TABLET | Freq: Once | ORAL | Status: DC

## 2023-04-01 MED ORDER — ENOXAPARIN SODIUM 80 MG/0.8ML IJ SOSY
80.0000 mg | PREFILLED_SYRINGE | Freq: Two times a day (BID) | INTRAMUSCULAR | 0 refills | Status: DC
  Filled 2023-04-01: qty 11.2, 7d supply, fill #0

## 2023-04-01 MED ORDER — ENOXAPARIN SODIUM 80 MG/0.8ML IJ SOSY
1.0000 mg/kg | PREFILLED_SYRINGE | Freq: Two times a day (BID) | INTRAMUSCULAR | 0 refills | Status: DC
  Filled 2023-04-01: qty 11.2, 7d supply, fill #0

## 2023-04-01 MED ORDER — NITROGLYCERIN 0.4 MG SL SUBL
0.4000 mg | SUBLINGUAL_TABLET | SUBLINGUAL | 12 refills | Status: AC | PRN
  Filled 2023-04-01: qty 25, 7d supply, fill #0

## 2023-04-01 NOTE — TOC Benefit Eligibility Note (Signed)
Patient Product/process development scientist completed.    The patient is insured through [employer]. Patient has Medicare and is not eligible for a copay card, but may be able to apply for patient assistance, if available.    Ran test claim for enoxaparin (Lovenox) 80 mg/0.8 ml and the current 30 day co-pay is $246.39.   This test claim was processed through [facility]- copay amounts may vary at other pharmacies due to pharmacy/plan contracts, or as the patient moves through the different stages of their insurance plan.     Roland Earl, CPHT Pharmacy Technician III Certified Patient Advocate [facility] Patient Advocate Team Direct Number: [phone]  Fax: [phone]

## 2023-04-01 NOTE — Telephone Encounter (Addendum)
Returned call to the pt and she states she was discharged on Lovenox 80mg  BID and advised to call Anticoagulation Clinic regarding warfarin dose.  INR was 1.4 today per chart and she states she is on the way home from hospital. Advised that she should follow the instructions per the hospital with her lovenox injections and to take warfarin 1 tablet of warfarin today and 1.5 tablets of warfarin tomorrow (extra 1/2 tablet for 2 days). She states she has not missed a dose and confirmed her tablet was brown in color, made her aware that the lovenox is there to protect her from clot or stroke while warfarin is building up and will need to remain on it until INR is at least a 2.5 (low end of her goal) or otherwise instructed. She verbalized understanding and confirmed dose today and tomorrow and lovenox twice a day. She will also limit leafy green intake. Advised her to call back if needed.

## 2023-04-01 NOTE — Plan of Care (Signed)

## 2023-04-01 NOTE — Discharge Summary (Signed)
Physician Discharge Summary   Patient: Abigail Oliver MRN: [id number] DOB: [birth year]  Admit date:     03/30/2023  Discharge date: 04/01/23  Discharge Physician: Enedina Finner   PCP: [name], NP   Recommendations at discharge:    Pt advised to take lovenox shots as per instrucitons and ,make f/u appt with Anticoagulation clinic  F/u Cardiology on your appt F/u PCP in 1-2 weeks  Discharge Diagnoses: Principal Problem:   Chest pain Active Problems:   H/O aortic valve replacement   Dyslipidemia   Chronic diastolic heart failure (HCC)   Essential hypertension   Anxiety  Abigail Oliver is a 77 y.o. female with medical history significant of CAD status post CABG and PCI, aortic stenosis status post mechanical AVR on warfarin, chronic HFpEF, hypertension, hyperlipidemia, GERD, PVD, carotid artery disease, type 2 diabetes, CKD stage 3b, anemia of chronic disease, anxiety, depression, glaucoma presented to ED with a chief complaint of left-sided chest pain.   She is on warfarin due to history of mechanical aortic valve and denies missing any doses.  She is currently on warfarin 3 mg MWF and 1.5 mg the remaining 4 days of the week   Chest pain History of CAD status post CABG and PCI --Patient is presenting with left-sided exertional burning chest pain radiating to the neck and associated with dyspnea and nausea.   --Ruled out NSTEMI/MI -- Cardiology was consulted by EDP.  Pt was seen by Dr Jens Som. Appears atypical pain --trop neg --pt CP free, no EKG changes --Continue aspirin, metoprolol, and Crestor.   Aortic stenosis status post mechanical AVR INR subtherapeutic at 1.4.  Patient denies missing any doses of warfarin and denies any dietary changes.  Started on IV heparin and pharmacy consulted for warfarin dosing. --will change to lovenox 1 mg/kg bid till INR is therapeutic--d/w RPH. Pt is agreeable and will get f/u with Anticoagulation clinic since she missed her  appt today   Chronic HFpEF Last echo done in April 2024 showing EF 60 to 65%.  Does not appear volume overloaded on exam.   --sats >92% on RA   Hypertension SBP currently in the 120s.  Continue metoprolol.   Hyperlipidemia Continue Crestor.   History of GERD Question whether this is contributing to her chest pain as she describes it as burning in nature.  Start IV Protonix 40 mg every 12 hours.   PVD Carotid artery disease Continue aspirin and statin.   Type 2 diabetes Glucose 213.  Last A1c 6.5 on 02/16/2023.  Sensitive sliding scale insulin ordered.   CKD stage IIIb Creatinine 1.3, stable.  Continue to monitor renal function.   Anemia of chronic disease Hemoglobin stable, continue to monitor labs.   Mild chronic thrombocytopenia Platelet count stable, continue to monitor labs.   Anxiety and depression Continue Zoloft.     Consultants: [facility] cardiology Procedures performed: none  Disposition: Home Diet recommendation:  Discharge Diet Orders (From admission, onward)     Start     Ordered   04/01/23 0000  Diet - low sodium heart healthy        04/01/23 1222           Cardiac diet DISCHARGE MEDICATION: Allergies as of 04/01/2023       Reactions   Atorvastatin    Muscle pain   Dapagliflozin    Other reaction(s): nausea   Gabapentin    Other reaction(s): stomach issues   Simvastatin Other (See Comments)   Muscle pain  Sulfa Antibiotics    unknown   Iodinated Contrast Media Rash    Red rash after cardiac cath 1 wk ago, ? Contrast allergy, requires 13 hr prep now per [email or web address]          Medication List     STOP taking these medications    cetirizine 10 MG tablet Commonly known as: ZYRTEC   pantoprazole 40 MG tablet Commonly known as: PROTONIX       TAKE these medications    aspirin EC 81 MG tablet Take 81 mg by mouth every evening.   cyanocobalamin 1000 MCG tablet Commonly known as: VITAMIN B12 Take 1,000 mcg by mouth  daily.   enoxaparin 80 MG/0.8ML injection Commonly known as: LOVENOX Inject 0.8 mLs (80 mg total) into the skin 2 (two) times daily.   fluticasone 50 MCG/ACT nasal spray Commonly known as: FLONASE Place 2 sprays into both nostrils daily. What changed:  when to take this reasons to take this   folic acid 800 MCG tablet Commonly known as: FOLVITE Take 800 mcg by mouth daily.   hydrOXYzine 25 MG capsule Commonly known as: VISTARIL Take 1 capsule (25 mg total) by mouth at bedtime as needed.   latanoprost 0.005 % ophthalmic solution Commonly known as: XALATAN PLACE 1 DROP INTO BOTH EYES AT BEDTIME.   melatonin 5 MG Tabs Take 1 tablet (5 mg total) by mouth at bedtime.   metoprolol succinate 25 MG 24 hr tablet Commonly known as: TOPROL-XL Take 50 mg by mouth daily.   multivitamin with minerals Tabs tablet Take 2 tablets by mouth daily.   nitroGLYCERIN 0.4 MG SL tablet Commonly known as: NITROSTAT Place 1 tablet (0.4 mg total) under the tongue every 5 (five) minutes as needed for chest pain.   potassium chloride SA 20 MEQ tablet Commonly known as: KLOR-CON M Take 1 tablet (20 mEq total) by mouth daily.   rosuvastatin 10 MG tablet Commonly known as: CRESTOR Take 10 mg by mouth every evening.   sertraline 50 MG tablet Commonly known as: ZOLOFT Take 1 tablet (50 mg total) by mouth daily.   torsemide 20 MG tablet Commonly known as: DEMADEX Take 1 tablet (20 mg total) by mouth daily.   valsartan 80 MG tablet Commonly known as: DIOVAN 1 tablet Orally Once a day   vitamin C 1000 MG tablet Take 1,000 mg by mouth every evening.   VITAMIN D3 PO Take 1,000 Units by mouth daily.   warfarin 3 MG tablet Commonly known as: COUMADIN Take as directed. If you are unsure how to take this medication, talk to your nurse or doctor. Original instructions: TAKE 1/2 TO 1 TABLET BY MOUTH DAILY AS DIRECTED What changed:  how much to take how to take this when to take  this additional instructions        Follow-up Information     Corrin Parker, PA-C Follow up.   Specialty: Cardiology Why: Hospital follow-up with Cardiology scheduled for 06/28/2023 at 10:30am. Please arrive 15 minutes early for check-in. If this date/ time does not work for you, please call our office to reschedulel Contact information: [address] [place] [address] [phone]         Medina-Vargas, Margit Banda, NP. Schedule an appointment as soon as possible for a visit in 1 week(s).   Specialty: Internal Medicine Why: hospital f/u Contact information: [address] [place] [address] [phone]  Discharge Exam: Filed Weights   03/30/23 1752  Weight: 79.8 kg   CV s1s2 normal  Resp CTA Abd soft Non tender, abd obesity  Condition at discharge: fair  The results of significant diagnostics from this hospitalization (including imaging, microbiology, ancillary and laboratory) are listed below for reference.   Imaging Studies: DG Chest 2 View  Result Date: 03/30/2023 CLINICAL DATA:  Left chest pain radiating to left neck. Shortness of breath. History of CHF. EXAM: CHEST - 2 VIEW COMPARISON:  09/04/2021. FINDINGS: The heart is enlarged and the mediastinal contour is within normal limits. There is atherosclerotic calcification of the aorta. The pulmonary vasculature is mildly distended. Lung volumes are low. No consolidation, effusion, or pneumothorax. Sternotomy wires are noted over the midline. A prosthetic cardiac valve is seen. Surgical clips are present in the right upper quadrant. Degenerative changes are noted in the thoracic spine. No acute osseous abnormality. IMPRESSION: Cardiomegaly with mildly distended pulmonary vasculature. Electronically Signed   By: Thornell Sartorius M.D.   On: 03/30/2023 20:12    Microbiology: Results for orders placed or performed during the hospital encounter of 09/04/21  Surgical PCR screen      Status: None   Collection Time: 09/07/21  8:14 AM   Specimen: Nasal Mucosa; Nasal Swab  Result Value Ref Range Status   MRSA, PCR NEGATIVE NEGATIVE Final   Staphylococcus aureus NEGATIVE NEGATIVE Final    Comment: (NOTE) The Xpert SA Assay (FDA approved for NASAL specimens in patients 27 years of age and older), is one component of a comprehensive surveillance program. It is not intended to diagnose infection nor to guide or monitor treatment. Performed at [facility], [address], [place], [place] [address]    *Note: Due to a large number of results and/or encounters for the requested time period, some results have not been displayed. A complete set of results can be found in Results Review.    Labs: CBC: Recent Labs  Lab 03/30/23 1805 03/31/23 0441 04/01/23 0346  WBC 8.5 6.9 5.1  HGB 10.6* 9.6* 8.9*  HCT 33.2* 30.4* 28.9*  MCV 89.7 90.5 87.3  PLT 131* 103* 84*   Basic Metabolic Panel: Recent Labs  Lab 03/30/23 1805 03/31/23 0441  NA 139 138  K 3.8 3.9  CL 105 105  CO2 25 22  GLUCOSE 213* 165*  BUN 32* 29*  CREATININE 1.36* 1.30*  CALCIUM 9.0 8.7*   Liver Function Tests: Recent Labs  Lab 03/30/23 1805  AST 38  ALT 27  ALKPHOS 64  BILITOT 0.9  PROT 7.2  ALBUMIN 4.0   CBG: Recent Labs  Lab 03/31/23 2031 03/31/23 2339 04/01/23 0353 04/01/23 0726 04/01/23 1123  GLUCAP 152* 109* 131* 144* 148*    Discharge time spent: greater than 30 minutes.  Signed: Enedina Finner, MD [facility] 04/01/2023

## 2023-04-01 NOTE — Plan of Care (Signed)

## 2023-04-01 NOTE — Care Management (Signed)
Called patient several times, cell and room phone to give abs letter. Messaged with nursing to coordinate phone call.

## 2023-04-01 NOTE — Discharge Instructions (Signed)
Pt advised to take lovenox shots as per instrucitons and ,make f/u appt with Anticoagulation clinic

## 2023-04-01 NOTE — Progress Notes (Signed)
Patient does not want echo at this time since she was told she would not have anymore testing.

## 2023-04-01 NOTE — Telephone Encounter (Signed)
Pt states that the hospital will not discharge until she has a prescription for Lovinox due to INR being 1.4. She would like a c/b regarding this matter as soon as possible. Please advise

## 2023-04-01 NOTE — Progress Notes (Signed)
PHARMACY - ANTICOAGULATION CONSULT NOTE  Pharmacy Consult for Heparin, Warfarin Indication: chest pain/ACS, mechanical AVR  Allergies  Allergen Reactions   Atorvastatin     Muscle pain   Dapagliflozin     Other reaction(s): nausea   Gabapentin     Other reaction(s): stomach issues   Simvastatin Other (See Comments)    Muscle pain   Sulfa Antibiotics     unknown   Iodinated Contrast Media Rash     Red rash after cardiac cath 1 wk ago, ? Contrast allergy, requires 13 hr prep now per dr.[email or web address]//[email or web address]      Patient Measurements: Height: 5\' 2"  (157.5 cm) Weight: 79.8 kg (176 lb) IBW/kg (Calculated) : 50.1 Heparin Dosing Weight: 68 kg  Vital Signs: Temp: 98 F (36.7 C) (11/08 0715) Temp Source: Oral (11/08 0715) BP: 129/72 (11/08 0715) Pulse Rate: 70 (11/08 0715)  Labs: Recent Labs    03/30/23 1805 03/30/23 1947 03/31/23 0253 03/31/23 0441 03/31/23 0606 03/31/23 1100 03/31/23 2041 04/01/23 0346  HGB 10.6*  --   --  9.6*  --   --   --  8.9*  HCT 33.2*  --   --  30.4*  --   --   --  28.9*  PLT 131*  --   --  103*  --   --   --  84*  LABPROT 17.7*  --   --  17.7*  --   --   --  17.8*  INR 1.4*  --   --  1.4*  --   --   --  1.4*  HEPARINUNFRC  --   --   --   --   --  0.23* 0.32 0.46  CREATININE 1.36*  --   --  1.30*  --   --   --   --   TROPONINIHS 7 7 7   --  7  --   --   --     Estimated Creatinine Clearance: 35.5 mL/min (A) (by C-G formula based on SCr of 1.3 mg/dL (H)).   Medical History: Past Medical History:  Diagnosis Date   Anemia    Anxiety    Aortic stenosis    a. Now has St Jude mechanical aortic valve (6/00). b. Echo (6/15) with EF 60-65%, mechanical aortic valve with mean gradient 27 mmHg.   Arthritis    a. Possible c-spine arthritis with pain down left arm.     Carotid artery disease (HCC)    a. Carotid dopplers (7/16) with 40-59% BICA stenosis.     Chronic angle-closure glaucoma(365.23)    Chronic diastolic CHF (congestive heart  failure) (HCC)    a.  RHC (7/15) with mean RA 2, PA 22/11, mean PCWP 9, CI 3.79.   Contrast media allergy    Coronary atherosclerosis of native coronary artery    a. CABG at time of AVR in 6/00 with RIMA-RCA. b. Abnl nuc 11/2013 -> LHC (7/15) with patent RIMA-RCA, 80% mRCA, 80% pLAD with FFR 0.73, treated with DES to pLAD.   Depression    Essential hypertension    GERD (gastroesophageal reflux disease)    Hyperlipidemia    Low back pain    Neuromuscular disorder (HCC)    neuropathy   Peripheral vascular disease (HCC)    a, H/o Right SFA stent. b. Peripheral arterial dopplers (7/16) with right SFA stent patent.     PONV (postoperative nausea and vomiting)    N&V   Type II diabetes mellitus (HCC)  Assessment: 16 yoF presents to ED on 11/6 due to chest pain.  PMH is significant for chronic warfarin anticoagulation for history of St Jude mechanical aortic valve replacement, as well as CAD s/p CABG, HF, HTN, HLD, PVD, DM, CKD.  Pharmacy is consulted to dose warfarin and bridge with Heparin IV while INR is subtherapeutic.    PTA warfarin dose 3mg  on Tues, Thurs, Sat, Sundays and 1.5 mg on Mon, Wed, Fridays.  Last dose taken on 11/5 at 22:00.  -Admit INR 1.4, subtherapeutic.  Patient reported that she hasn't missed any doses, started any new medications, or changed her dietary vitamin K intake.    -Heparin level 0.46 on 1200 units/hr, plt= 84 (history of thrombocytopenia) -INR= 1.4  Goal of Therapy:  Heparin level 0.3-0.7 units/ml INR 2.5-3.5  (INR goal per anticoag clinic notes) Monitor platelets by anticoagulation protocol: Yes   Plan:  -Warfarin 4mg  po today -Continue heparin 1300 units/hr -Monitor daily INR, heparin level, CBC   Harland German, PharmD Clinical Pharmacist **Pharmacist phone directory can now be found on [email or web address] ([email or web address]).  Listed under [facility].

## 2023-04-05 ENCOUNTER — Ambulatory Visit: Payer: Medicare Other | Attending: Cardiology

## 2023-04-05 ENCOUNTER — Other Ambulatory Visit: Payer: Self-pay | Admitting: Adult Health

## 2023-04-05 DIAGNOSIS — Z952 Presence of prosthetic heart valve: Secondary | ICD-10-CM | POA: Insufficient documentation

## 2023-04-05 DIAGNOSIS — Z7901 Long term (current) use of anticoagulants: Secondary | ICD-10-CM | POA: Diagnosis not present

## 2023-04-05 DIAGNOSIS — F339 Major depressive disorder, recurrent, unspecified: Secondary | ICD-10-CM

## 2023-04-05 LAB — POCT INR: INR: 2.2 (ref 2.0–3.0)

## 2023-04-05 NOTE — Patient Instructions (Signed)
Today take 2 tablets of warfarin then Continue taking warfarin 1 tablet daily except for 1/2 a tablet on Monday, Wednesday and Friday. Repeat INR in 4 weeks.  Coumadin Clinic [phone]

## 2023-04-08 ENCOUNTER — Other Ambulatory Visit: Payer: Self-pay

## 2023-04-08 MED ORDER — METOPROLOL SUCCINATE ER 50 MG PO TB24: 50.0000 mg | ORAL_TABLET | Freq: Every day | ORAL | 3 refills | Status: DC

## 2023-04-08 NOTE — Telephone Encounter (Signed)
Updated script sent for 50mg  daily to [facility] on [address]. Called pt to inform her but no answer. lvm

## 2023-04-12 DIAGNOSIS — I739 Peripheral vascular disease, unspecified: Secondary | ICD-10-CM | POA: Diagnosis not present

## 2023-04-12 DIAGNOSIS — N1832 Chronic kidney disease, stage 3b: Secondary | ICD-10-CM | POA: Diagnosis not present

## 2023-04-12 DIAGNOSIS — I5030 Unspecified diastolic (congestive) heart failure: Secondary | ICD-10-CM | POA: Diagnosis not present

## 2023-04-12 DIAGNOSIS — E1122 Type 2 diabetes mellitus with diabetic chronic kidney disease: Secondary | ICD-10-CM | POA: Diagnosis not present

## 2023-04-12 DIAGNOSIS — E785 Hyperlipidemia, unspecified: Secondary | ICD-10-CM | POA: Diagnosis not present

## 2023-04-18 ENCOUNTER — Ambulatory Visit: Payer: Medicare Other | Admitting: Professional Counselor

## 2023-04-18 ENCOUNTER — Encounter: Payer: Self-pay | Admitting: Professional Counselor

## 2023-04-18 DIAGNOSIS — F411 Generalized anxiety disorder: Secondary | ICD-10-CM

## 2023-04-18 DIAGNOSIS — F331 Major depressive disorder, recurrent, moderate: Secondary | ICD-10-CM

## 2023-04-18 NOTE — Progress Notes (Signed)
      [facility] Counselor/Therapist Progress Note  Patient ID: Abigail Oliver, MRN: [id number],    Date: 04/18/2023  Time Spent: 9:15 AM to 10:14 AM  Treatment Type: Individual Therapy  Reported Symptoms: Sleeplessness, tearfulness, grief/loss, stress, sadness, worries, interpersonal concerns, caregiver strain  Mental Status Exam:  Appearance:   Neat     Behavior:  Appropriate, Sharing, and Motivated  Motor:  Normal  Speech/Language:   Clear and Coherent and Normal Rate  Affect:  Appropriate and Congruent  Mood:  depressed  Thought process:  normal  Thought content:    WNL  Sensory/Perceptual disturbances:    WNL  Orientation:  Sound  Attention:  Good  Concentration:  Good  Memory:  WNL  Fund of knowledge:   Good  Insight:    Good  Judgment:   Good  Impulse Control:  Good   Risk Assessment: Danger to Self:  No Self-injurious Behavior: No Danger to Others: No Duty to Warn:no Physical Aggression / Violence:No  Access to Firearms a concern: No  Gang Involvement:No   Subjective: Patient presented to session to address concerns of anxiety and depression.  She reported limited progress at this time.  Patient processed experience of ebb and flow of relationship with adult children, and 1 relationship where she experiences considerable stress and worry.  Counselor helped to facilitate insight and develop strategies with patient to increase self protection and empower others.  Patient voiced worry for her husband and his health and mental state, and extra responsibility this creates for her and loneliness for patient given his mental state.  Patient identified her job as challenging per uncertain arrangement with pay, and frustration with operations; counselor and patient discussed situation and ways to improve, and patient identified intention to self advocate as a short-term goal between sessions.  Patient voiced enjoyment of crochet and crafts and she and counselor discussed  how she might be able to form a club for social connection outlet, which she feels she needs, a place to belong.  Patient reflected on how over-doing for others is a message per her family of origin dynamics, "guilt...a powerful weapon".  Counselor and patient discussed specific boundaries psychoeducation, and counselor provided worksheets for patient to review and work on, and bring back to session next time.  Interventions: Assertiveness/Communication, Solution-Oriented/Positive Psychology, Humanistic/Existential, and Insight-Oriented, Psychoeducation  Diagnosis:   ICD-10-CM   1. Generalized anxiety disorder  F41.1     2. Major depressive disorder, recurrent episode, moderate (HCC)  F33.1       Plan: Patient is scheduled for a follow-up; continue process work and developing coping skills.  Patient to work on short-term goals as identified above.  Progress note was dictated via Dragon and checked for accuracy.  Gaspar Bidding, [facility]

## 2023-04-25 ENCOUNTER — Ambulatory Visit: Payer: Medicare Other | Admitting: Professional Counselor

## 2023-04-25 ENCOUNTER — Encounter: Payer: Self-pay | Admitting: Professional Counselor

## 2023-04-25 DIAGNOSIS — F411 Generalized anxiety disorder: Secondary | ICD-10-CM | POA: Diagnosis not present

## 2023-04-25 DIAGNOSIS — F331 Major depressive disorder, recurrent, moderate: Secondary | ICD-10-CM

## 2023-04-25 NOTE — Progress Notes (Signed)
      [facility] Counselor/Therapist Progress Note  Patient ID: Abigail Oliver, MRN: [id number],    Date: 04/25/2023  Time Spent: 9:10 AM to 10:07 AM  Treatment Type: Individual Therapy  Reported Symptoms: Worries, sadness, stress, restlessness, caregiver strain, health concerns, phase of life concerns, interpersonal concerns  Mental Status Exam:  Appearance:   Neat     Behavior:  Appropriate, Sharing, and Motivated  Motor:  Shuffling Gait  Speech/Language:   Clear and Coherent and Normal Rate  Affect:  Appropriate and Congruent  Mood:  normal  Thought process:  normal  Thought content:    WNL  Sensory/Perceptual disturbances:    WNL  Orientation:  Sound  Attention:  Good  Concentration:  Good  Memory:  WNL  Fund of knowledge:   Good  Insight:    Good  Judgment:   Good  Impulse Control:  Good   Risk Assessment: Danger to Self:  No Self-injurious Behavior: No Danger to Others: No Duty to Warn:no Physical Aggression / Violence:No  Access to Firearms a concern: No  Gang Involvement:No   Subjective: Patient presented to session to address concerns of anxiety and depression.  Patient voiced limited progress at this time and herself to be "not well".  She reported her adult daughter to be experiencing a great deal of stress and for patient to be mitigating stress as well as a result.  Patient voiced continuing to practice STG of boundary setting but for it to be hard. She voiced her husband to not be doing well with his physical health, and for her to be greatly concerned, and continuing her caregiving role.  Patient presented to session having difficulty walking, using her cane but seeming unstable.  Counselor addressed, and patient reported her knee and balance to not be doing well.  She voiced having cleaned up the house recently and to have overdone it.  Counselor assisted patient with resources per [employer] [facility], encouraging patient to seek volunteer  assistance with home needs, and home health options for husband for support.  They discussed the [facility] which has a crochet class which patient has been interested in, and the [employer] program that offers help around the house, and the Caregiver Assistance and Options Counseling program.  Counselor gave patient numbers, website and other information to follow-up.   Interventions: Assertiveness/Communication, Solution-Oriented/Positive Psychology, Humanistic/Existential, Insight-Oriented, and Resourcing   Diagnosis:   ICD-10-CM   1. Generalized anxiety disorder  F41.1     2. Major depressive disorder, recurrent episode, moderate (HCC)  F33.1       Plan: Patient is scheduled for a follow-up; continue process work and developing coping skills.  Patient identified short-term goal of looking into resources provided, including crochet class as healthy outlets, handy helpers program for help needs, and caregiver assistance and options counseling to address needs of patient and husband at this time.  Progress note was dictated via Dragon and reviewed for accuracy.  Gaspar Bidding, [facility]

## 2023-05-02 ENCOUNTER — Encounter: Payer: Self-pay | Admitting: Professional Counselor

## 2023-05-02 ENCOUNTER — Ambulatory Visit: Payer: Medicare Other | Admitting: Professional Counselor

## 2023-05-02 DIAGNOSIS — F411 Generalized anxiety disorder: Secondary | ICD-10-CM

## 2023-05-02 DIAGNOSIS — F331 Major depressive disorder, recurrent, moderate: Secondary | ICD-10-CM

## 2023-05-02 NOTE — Progress Notes (Signed)
      [facility] Counselor/Therapist Progress Note  Patient ID: Abigail Oliver, MRN: [id number],    Date: 05/02/2023  Time Spent: 9:15 AM to 10:11 AM  Treatment Type: Family Session with Patient  Patient present with spouse for session.  Reported Symptoms: sense of overwhelm, worries, stress, sadness, caregiver strain, interpersonal concerns  Mental Status Exam:  Appearance:   Neat     Behavior:  Appropriate, Sharing, and Motivated  Motor:  Normal  Speech/Language:   Clear and Coherent and Normal Rate  Affect:  Appropriate and Congruent  Mood:  anxious and sad  Thought process:  normal  Thought content:    WNL  Sensory/Perceptual disturbances:    WNL  Orientation:  Sound  Attention:  Good  Concentration:  Good  Memory:  WNL  Fund of knowledge:   Good  Insight:    Good  Judgment:   Good  Impulse Control:  Good   Risk Assessment: Danger to Self:  No Self-injurious Behavior: No Danger to Others: No Duty to Warn:no Physical Aggression / Violence:No  Access to Firearms a concern: No  Gang Involvement:No   Subjective: Patient presented to session to address concerns of anxiety and depression.  She reported limited progress at this time.  Patient presented to session with her husband to discuss concerns.  Patient and spouse shared regarding considerable familial strain related to one of their adult children and challenges she is facing.  Patient reported having followed up on resources counselor provided last session, and patient and counselor continued to discuss option of Al-Anon meetings for group support.  Patient counselor and patient spouse discussed implementation of boundaries, how both patient and spouse are utilizing boundaries skill set how they could increase utilization.  Patient and spouse sense of their future being on hold due to current circumstances, and their sense of futility in that what they are doing to help only serves to enable.  Counselor actively  listened, helped to facilitate insight, affirmed feelings and experience, and assisted with strategies for improvement.  Patient identified short-term goal of continuing to implement firm boundaries.  Interventions: Assertiveness/Communication, Solution-Oriented/Positive Psychology, Humanistic/Existential, Insight-Oriented, and Family Systems  Diagnosis:   ICD-10-CM   1. Generalized anxiety disorder  F41.1     2. Major depressive disorder, recurrent episode, moderate (HCC)  F33.1       Plan: Patient is scheduled for a follow-up; continue process work and developing coping skills.  Patient to implement STG between sessions as identified above.  Gaspar Bidding, [facility]

## 2023-05-03 ENCOUNTER — Ambulatory Visit: Payer: Medicare Other | Attending: Cardiovascular Disease | Admitting: *Deleted

## 2023-05-03 DIAGNOSIS — Z7901 Long term (current) use of anticoagulants: Secondary | ICD-10-CM | POA: Insufficient documentation

## 2023-05-03 DIAGNOSIS — Z952 Presence of prosthetic heart valve: Secondary | ICD-10-CM | POA: Diagnosis not present

## 2023-05-03 LAB — POCT INR: INR: 5.8 — AB (ref 2.0–3.0)

## 2023-05-03 NOTE — Patient Instructions (Signed)
Description   Do not take any warfarin today and no warfarin tomorrow warfarin and have a green leafy today then continue taking warfarin 1 tablet daily except for 1/2 tablet on Monday, Wednesday and Friday. Repeat INR in 2 weeks. Coumadin Clinic [phone]

## 2023-05-05 ENCOUNTER — Other Ambulatory Visit: Payer: Self-pay | Admitting: Physician Assistant

## 2023-05-05 DIAGNOSIS — L920 Granuloma annulare: Secondary | ICD-10-CM | POA: Diagnosis not present

## 2023-05-05 DIAGNOSIS — L309 Dermatitis, unspecified: Secondary | ICD-10-CM | POA: Diagnosis not present

## 2023-05-06 LAB — DERMATOLOGY PATHOLOGY

## 2023-05-09 ENCOUNTER — Encounter: Payer: Self-pay | Admitting: Professional Counselor

## 2023-05-09 ENCOUNTER — Ambulatory Visit: Payer: Medicare Other | Admitting: Professional Counselor

## 2023-05-09 DIAGNOSIS — F331 Major depressive disorder, recurrent, moderate: Secondary | ICD-10-CM

## 2023-05-09 DIAGNOSIS — F411 Generalized anxiety disorder: Secondary | ICD-10-CM

## 2023-05-09 NOTE — Progress Notes (Signed)
°      [facility] Counselor/Therapist Progress Note  Patient ID: Abigail Oliver, MRN: [id number],    Date: 05/09/2023  Time Spent: 9:15 AM to 10:10 AM  Treatment Type: Individual Therapy  Reported Symptoms: worries, sadness, stress, interpersonal concerns, caregiver strain, advocacy concern  Mental Status Exam:  Appearance:   Neat     Behavior:  Appropriate, Sharing, and Motivated  Motor:  Normal  Speech/Language:   Clear and Coherent and Normal Rate  Affect:  Appropriate and Congruent  Mood:  anxious and sad  Thought process:  normal  Thought content:    WNL  Sensory/Perceptual disturbances:    WNL  Orientation:  Sound  Attention:  Good  Concentration:  Good  Memory:  WNL  Fund of knowledge:   Good  Insight:    Good  Judgment:   Good  Impulse Control:  Good   Risk Assessment: Danger to Self:  No Self-injurious Behavior: No Danger to Others: No Duty to Warn:no Physical Aggression / Violence:No  Access to Firearms a concern: No  Gang Involvement:No   Subjective: Patient presented to session to address concerns of anxiety and depression.  She reported mixed progress at this time.  She reported to be working on implementing boundaries, in telling people no, and regarding her workplace stress.  Counselor affirmed patient proactive self advocacy.  Counselor assisted patient with resources pertaining to her report of not having been paid at workplace since July in spite of having asked.  Patient voiced concern for a rash that started 2 months ago, and reported having been to the dermatologist for biopsy.  She reported taking a cream that is not helping, and voiced concern about the rash in relation to her experience of diabetes.  She also processed grief and fears regarding daughter in court today and outcome.  Counselor actively listened, helped facilitate insight and reinforce patient self protective and self-care strategies, and facilitated assessments PHQ-9 with score of 10  and GAD-7 with score of 18 to track patient progress.  Counselor and patient discussed patient short-term goal of following up with senior resources regarding crochet group for a creative and social outlet, to continue to implement boundaries where indicated, and continue to address workplace concern.  Interventions: Assertiveness/Communication, Solution-Oriented/Positive Psychology, Humanistic/Existential, Insight-Oriented, and Resourcing  Diagnosis:   ICD-10-CM   1. Generalized anxiety disorder  F41.1     2. Major depressive disorder, recurrent episode, moderate (HCC)  F33.1       Plan: Patient is scheduled for follow-up; continue process work and developing coping skills.  Patient to address short-term goal between sessions as identified above.  Progress note dictated with Dragon and reviewed for accuracy.  Gaspar Bidding, [facility]

## 2023-05-13 ENCOUNTER — Encounter (INDEPENDENT_AMBULATORY_CARE_PROVIDER_SITE_OTHER): Payer: Medicare Other | Admitting: Adult Health

## 2023-05-13 NOTE — Progress Notes (Signed)
No show  This encounter was created in error - please disregard.

## 2023-05-20 ENCOUNTER — Ambulatory Visit: Payer: Medicare Other | Attending: Cardiovascular Disease

## 2023-05-20 DIAGNOSIS — Z7901 Long term (current) use of anticoagulants: Secondary | ICD-10-CM | POA: Diagnosis not present

## 2023-05-20 DIAGNOSIS — Z952 Presence of prosthetic heart valve: Secondary | ICD-10-CM | POA: Diagnosis not present

## 2023-05-20 LAB — POCT INR: INR: 2.2 (ref 2.0–3.0)

## 2023-05-20 NOTE — Patient Instructions (Signed)
TAKE 1 TABLET TODAY ONLY THEN  continue taking warfarin 1 tablet daily except for 1/2 tablet on Monday, Wednesday and Friday. Repeat INR in 4 weeks. [facility] [phone]

## 2023-05-26 ENCOUNTER — Ambulatory Visit: Payer: Medicare Other | Admitting: Adult Health

## 2023-05-26 ENCOUNTER — Encounter: Payer: Self-pay | Admitting: Adult Health

## 2023-05-26 VITALS — BP 126/78 | HR 60 | Temp 97.7°F | Resp 18 | Ht 62.0 in | Wt 180.0 lb

## 2023-05-26 DIAGNOSIS — F5101 Primary insomnia: Secondary | ICD-10-CM

## 2023-05-26 DIAGNOSIS — I10 Essential (primary) hypertension: Secondary | ICD-10-CM

## 2023-05-26 DIAGNOSIS — F418 Other specified anxiety disorders: Secondary | ICD-10-CM | POA: Diagnosis not present

## 2023-05-26 DIAGNOSIS — L920 Granuloma annulare: Secondary | ICD-10-CM

## 2023-05-26 DIAGNOSIS — I5032 Chronic diastolic (congestive) heart failure: Secondary | ICD-10-CM | POA: Diagnosis not present

## 2023-05-26 DIAGNOSIS — N1832 Chronic kidney disease, stage 3b: Secondary | ICD-10-CM | POA: Diagnosis not present

## 2023-05-26 DIAGNOSIS — E1143 Type 2 diabetes mellitus with diabetic autonomic (poly)neuropathy: Secondary | ICD-10-CM | POA: Diagnosis not present

## 2023-05-26 DIAGNOSIS — Z952 Presence of prosthetic heart valve: Secondary | ICD-10-CM | POA: Diagnosis not present

## 2023-05-26 MED ORDER — DULOXETINE HCL 30 MG PO CPEP: 30.0000 mg | ORAL_CAPSULE | Freq: Every day | ORAL | 3 refills | Status: DC

## 2023-05-26 MED ORDER — MIRTAZAPINE 15 MG PO TABS: 15.0000 mg | ORAL_TABLET | Freq: Every day | ORAL | 3 refills | Status: DC

## 2023-05-26 NOTE — Progress Notes (Signed)
 [facility] clinic  Provider:  Jereld Serum DNP  Code Status:  Full Code  Goals of Care:     05/26/2023    9:49 AM  Advanced Directives  Does Patient Have a Medical Advance Directive? No  Would patient like information on creating a medical advance directive? No - Patient declined     Chief Complaint  Patient presents with   Medical Management of Chronic Issues    3 mth follow up   Immunizations    Influenza, Covid, Shingrix and Pneumonia. Patient refuse vaccine .   Health Maintenance    Medicare Annual Wellness Visit     HPI: Patient is a 78 y.o. female seen today for a 48-month follow up of chronic medical issues. 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 Skin biopsy was done. She has multiple circular erythematous patches on her 4 extremities. She goes to [facility]. She stated that she had rashes 3 months ago and her rashes burns.  Chronic kidney disease stage 3a (HCC) -  follows up with kidney doctor, avoids over the counter medications  Primary insomnia  -  sleeps 3-4 hours/night, takes Vistaril , Trazodone  and Melatonin  H/O aortic valve replacement - takes Coumadin , last INR 2.2  Chronic diastolic heart failure (HCC) -  gets winded when walking short distances, follows up with cardiologist on 06/28/23, takes Demadex   Essential hypertension -  BP 126/78, takes Valsartan   Type II diabetes mellitus with peripheral autonomic neuropathy (HCC) - diet-controlled  Depression and anxiety - PHQ-9 score 11, My family doesn't get along, daughter facing jail time, takes Sertraline    Past Medical History:  Diagnosis Date   Anemia    Anxiety    Aortic stenosis    a. Now has St Jude mechanical aortic valve (6/00). b. Echo (6/15) with EF 60-65%, mechanical aortic valve with mean gradient 27 mmHg.   Arthritis    a. Possible c-spine arthritis with pain down left arm.     Carotid artery disease (HCC)    a. Carotid dopplers (7/16) with 40-59%  BICA stenosis.     Chronic angle-closure glaucoma(365.23)    Chronic diastolic CHF (congestive heart failure) (HCC)    a.  RHC (7/15) with mean RA 2, PA 22/11, mean PCWP 9, CI 3.79.   Contrast media allergy    Coronary atherosclerosis of native coronary artery    a. CABG at time of AVR in 6/00 with RIMA-RCA. b. Abnl nuc 11/2013 -> LHC (7/15) with patent RIMA-RCA, 80% mRCA, 80% pLAD with FFR 0.73, treated with DES to pLAD.   Depression    Essential hypertension    GERD (gastroesophageal reflux disease)    Hyperlipidemia    Low back pain    Neuromuscular disorder (HCC)    neuropathy   Peripheral vascular disease (HCC)    a, H/o Right SFA stent. b. Peripheral arterial dopplers (7/16) with right SFA stent patent.     PONV (postoperative nausea and vomiting)    N&V   Type II diabetes mellitus [facility])     Past Surgical History:  Procedure Laterality Date   BILATERAL OOPHORECTOMY  05/24/1985   BREAST BIOPSY  05/24/2010   left   CARDIAC CATHETERIZATION  01/14/1999   normal LV function, severe aortic stenosis; 80% and 70% stenosis in RCA; mild 20% distal norrowing in L main with 20% proximal LAD stenosis, 40% diagonal stenosis and 20% proximal circumflex stenosis   CARDIAC VALVE REPLACEMENT  05/24/1998   aortic valve    CARDIOVASCULAR  STRESS TEST  08/25/2011   R/L MV - EF 72%; no scintigraphic evidence of inducible MI; normal perfusionTID of 1.25 elevated - could indicate small vessle subendocardial ischemia; EKG NSR at 66, non diagnostic for ischemia   CARPAL TUNNEL RELEASE  05/25/1987   bilaterally   CATARACT EXTRACTION W/ INTRAOCULAR LENS  IMPLANT, BILATERAL  ~ 2007   CESAREAN SECTION  1969; 1971   CHOLECYSTECTOMY  05/24/1988   COLONOSCOPY N/A 10/27/2012   Procedure: COLONOSCOPY;  Surgeon: Belvie JONETTA Just, MD;  Location: [facility];  Service: Endoscopy;  Laterality: N/A;   CORONARY ARTERY BYPASS GRAFT  05/24/1998   RIMA to RCA   CORONARY/GRAFT ANGIOGRAPHY N/A 09/07/2021   Procedure:  CORONARY/GRAFT ANGIOGRAPHY;  Surgeon: Wendel Lurena POUR, MD;  Location: [facility] INVASIVE CV LAB;  Service: Cardiovascular;  Laterality: N/A;   DOPPLER ECHOCARDIOGRAPHY  03/29/2012   EF >55%; mild concentric LVH; stage 1 diastolic dysfunction, elevated LV filling pressure, dilated LA; MAC mild MR; St Jude AVR peak and mean gradients of and ; transvalvular gradients have increased (prev 23 and 14 respectively)   ESOPHAGOGASTRODUODENOSCOPY N/A 10/27/2012   Procedure: ESOPHAGOGASTRODUODENOSCOPY (EGD);  Surgeon: Belvie JONETTA Just, MD;  Location: [facility];  Service: Endoscopy;  Laterality: N/A;   FEMORAL ARTERY STENT  09/20/2011   6 x 40 Smart Nitinol self-expanding stent placed;  10/15/2031 -R SFA stent open and patent w/o evidence of restenosis   FRACTIONAL FLOW RESERVE WIRE  12/14/2013   Procedure: FRACTIONAL FLOW RESERVE WIRE;  Surgeon: Ezra GORMAN Shuck, MD;  Location: [facility] CATH LAB;  Service: Cardiovascular;;   KNEE SURGERY Left    LACERATION REPAIR     right hand   LEFT AND RIGHT HEART CATHETERIZATION WITH CORONARY ANGIOGRAM N/A 12/14/2013   Procedure: LEFT AND RIGHT HEART CATHETERIZATION WITH CORONARY ANGIOGRAM;  Surgeon: Ezra GORMAN Shuck, MD;  Location: [facility] CATH LAB;  Service: Cardiovascular;  Laterality: N/A;   LOWER EXTREMITY ANGIOGRAM N/A 09/20/2011   Procedure: LOWER EXTREMITY ANGIOGRAM;  Surgeon: Dorn JINNY Lesches, MD;  Location: [facility] CATH LAB;  Service: Cardiovascular;  Laterality: N/A;   LYMPH NODE BIOPSY  06/25/2011   core needle on 5   needle biopsy  05/25/2007   on ankles for nerve damage   NEUROPLASTY / TRANSPOSITION ULNAR NERVE AT ELBOW     right   ORIF FEMUR FRACTURE Left 09/09/2021   Procedure: OPEN REDUCTION INTERNAL FIXATION (ORIF) DISTAL FEMUR FRACTURE;  Surgeon: Kendal Franky SQUIBB, MD;  Location: [facility] OR;  Service: Orthopedics;  Laterality: Left;   PERCUTANEOUS CORONARY STENT INTERVENTION (PCI-S)  12/14/2013   Procedure: PERCUTANEOUS CORONARY STENT INTERVENTION (PCI-S);   Surgeon: Ezra GORMAN Shuck, MD;  Location: [facility] CATH LAB;  Service: Cardiovascular;;  Prox LAD 3.00x12 Promus DES    PERIPHERAL ARTERIAL STENT GRAFT  09/20/2011   right SFA   REFRACTIVE SURGERY  ` 2004   for glaucoma   TONSILLECTOMY  05/24/1966   TRIGGER FINGER RELEASE Left 12/04/2015   Procedure: LEFT RING FINGER TRIGGER RELEASE;  Surgeon: Franky Curia, MD;  Location: [facility];  Service: Orthopedics;  Laterality: Left;   VAGINAL HYSTERECTOMY  05/25/1983   Fibroids    Allergies  Allergen Reactions   Atorvastatin      Muscle pain   Dapagliflozin     Other reaction(s): nausea   Gabapentin      Other reaction(s): stomach issues   Simvastatin  Other (See Comments)    Muscle pain   Sulfa  Antibiotics     unknown   Iodinated Contrast Media  Rash     Red rash after cardiac cath 1 wk ago, ? Contrast allergy, requires 13 hr prep now per [email or web address]      Outpatient Encounter Medications as of 05/26/2023  Medication Sig   Ascorbic Acid  (VITAMIN C ) 1000 MG tablet Take 1,000 mg by mouth every evening.   aspirin  EC 81 MG tablet Take 81 mg by mouth every evening.   Cholecalciferol (VITAMIN D3 PO) Take 1,000 Units by mouth daily.   cyanocobalamin  (VITAMIN B12) 1000 MCG tablet Take 1,000 mcg by mouth daily.   fluticasone  (FLONASE ) 50 MCG/ACT nasal spray Place 2 sprays into both nostrils daily. (Patient taking differently: Place 2 sprays into both nostrils daily as needed for allergies.)   folic acid  (FOLVITE ) 800 MCG tablet Take 800 mcg by mouth daily.   hydrOXYzine  (VISTARIL ) 25 MG capsule Take 1 capsule (25 mg total) by mouth at bedtime as needed.   latanoprost  (XALATAN ) 0.005 % ophthalmic solution PLACE 1 DROP INTO BOTH EYES AT BEDTIME.   melatonin 5 MG TABS Take 1 tablet (5 mg total) by mouth at bedtime.   metoprolol  succinate (TOPROL  XL) 50 MG 24 hr tablet Take 1 tablet (50 mg total) by mouth daily. Take with or immediately following a meal. (Patient taking  differently: Take 50 mg by mouth 2 (two) times daily. Take with or immediately following a meal.)   Multiple Vitamin (MULTIVITAMIN WITH MINERALS) TABS tablet Take 2 tablets by mouth daily.   nitroGLYCERIN  (NITROSTAT ) 0.4 MG SL tablet Place 1 tablet (0.4 mg total) under the tongue every 5 (five) minutes as needed for chest pain.   potassium chloride  SA (KLOR-CON  M) 20 MEQ tablet Take 1 tablet (20 mEq total) by mouth daily.   rosuvastatin  (CRESTOR ) 10 MG tablet Take 10 mg by mouth every evening.   sertraline  (ZOLOFT ) 50 MG tablet TAKE 1 TABLET BY MOUTH DAILY   torsemide  (DEMADEX ) 20 MG tablet Take 1 tablet (20 mg total) by mouth daily.   valsartan  (DIOVAN ) 80 MG tablet 1 tablet Orally Once a day   warfarin (COUMADIN ) 3 MG tablet TAKE 1/2 TO 1 TABLET BY MOUTH DAILY AS DIRECTED (Patient taking differently: Take 1.5-3 mg by mouth as directed. TAKE 1 TABLET (3 MG) ON SAT, SUN, TUES, THURS & TAKE 1/2 TABLET (1.5 MG) on Mon, Wed, Friday)   [DISCONTINUED] enoxaparin  (LOVENOX ) 80 MG/0.8ML injection Inject 0.8 mLs (80 mg total) into the skin 2 (two) times daily.   No facility-administered encounter medications on file as of 05/26/2023.    Review of Systems:  Review of Systems  Constitutional:  Negative for appetite change, chills, fatigue and fever.  HENT:  Negative for congestion, hearing loss, rhinorrhea and sore throat.   Eyes: Negative.   Respiratory:  Negative for cough, shortness of breath and wheezing.   Cardiovascular:  Negative for chest pain, palpitations and leg swelling.  Gastrointestinal:  Negative for abdominal pain, constipation, diarrhea, nausea and vomiting.  Genitourinary:  Negative for dysuria.  Musculoskeletal:  Negative for arthralgias, back pain and myalgias.  Skin:  Negative for color change, rash and wound.  Neurological:  Negative for dizziness, weakness and headaches.  Psychiatric/Behavioral:  Negative for behavioral problems. The patient is not nervous/anxious.     Health  Maintenance  Topic Date Due   Medicare Annual Wellness (AWV)  Never done   Pneumonia Vaccine 38+ Years old (1 of 2 - PCV) Never done   Zoster Vaccines- Shingrix (1 of 2) Never done   INFLUENZA VACCINE  12/23/2022  COVID-19 Vaccine (4 - 2024-25 season) 01/23/2023   HEMOGLOBIN A1C  08/16/2023   Diabetic kidney evaluation - Urine ACR  02/16/2024   FOOT EXAM  02/16/2024   OPHTHALMOLOGY EXAM  03/17/2024   Diabetic kidney evaluation - eGFR measurement  03/30/2024   DTaP/Tdap/Td (4 - Td or Tdap) 03/16/2026   DEXA SCAN  Completed   Hepatitis C Screening  Completed   HPV VACCINES  Aged Out   Colonoscopy  Discontinued    Physical Exam: Vitals:   05/26/23 0949  BP: 126/78  Pulse: 60  Resp: 18  Temp: 97.7 F (36.5 C)  SpO2: 95%  Weight: 180 lb (81.6 kg)  Height: 5' 2 (1.575 m)   Body mass index is 32.92 kg/m. Physical Exam Constitutional:      General: She is not in acute distress.    Appearance: Normal appearance.  HENT:     Head: Normocephalic and atraumatic.     Nose: Nose normal.     Mouth/Throat:     Mouth: Mucous membranes are moist.  Eyes:     Conjunctiva/sclera: Conjunctivae normal.  Cardiovascular:     Rate and Rhythm: Normal rate and regular rhythm.  Pulmonary:     Effort: Pulmonary effort is normal.     Breath sounds: Normal breath sounds.  Abdominal:     General: Bowel sounds are normal.     Palpations: Abdomen is soft.  Musculoskeletal:        General: Normal range of motion.     Cervical back: Normal range of motion.  Skin:    General: Skin is warm and dry.     Findings: Rash present.     Comments: Has multiple erythematous circular patches on all 4 extremities  Neurological:     General: No focal deficit present.     Mental Status: She is alert and oriented to person, place, and time.  Psychiatric:        Mood and Affect: Mood normal.        Behavior: Behavior normal.     Labs reviewed: Basic Metabolic Panel: Recent Labs    02/16/23 0936  03/30/23 1805 03/31/23 0441  NA 140 139 138  K 4.5 3.8 3.9  CL 105 105 105  CO2 25 25 22   GLUCOSE 242* 213* 165*  BUN 35* 32* 29*  CREATININE 1.59* 1.36* 1.30*  CALCIUM  9.8 9.0 8.7*   Liver Function Tests: Recent Labs    01/10/23 0000 03/30/23 1805  AST 20 38  ALT 11 27  ALKPHOS 80 64  BILITOT  --  0.9  PROT  --  7.2  ALBUMIN 4.6 4.0   Recent Labs    03/30/23 1805 03/31/23 0441  LIPASE 54* 42   No results for input(s): AMMONIA in the last 8760 hours. CBC: Recent Labs    12/10/22 1421 01/10/23 0000 02/16/23 0936 03/30/23 1805 03/31/23 0441 04/01/23 0346  WBC 5.1 6.2 4.8 8.5 6.9 5.1  NEUTROABS 2,999 3.50 2,818  --   --   --   HGB 8.8* 10.5* 9.8* 10.6* 9.6* 8.9*  HCT 27.6* 32* 31.6* 33.2* 30.4* 28.9*  MCV 88.2  --  88.8 89.7 90.5 87.3  PLT 121* 128* 113* 131* 103* 84*   Lipid Panel: Recent Labs    02/16/23 0936  CHOL 167  HDL 46*  LDLCALC 83  TRIG 713*  CHOLHDL 3.6   Lab Results  Component Value Date   HGBA1C 6.5 (H) 02/16/2023    Procedures since last visit: No  results found.  Assessment/Plan  1. Granuloma annulare (Primary) -  follows up with dermatology -  keep skin clean and dry  2. Primary insomnia -  ordered Remeron  but declined to take it due to side-effects -  continue Vistaril  PRN - hydrOXYzine  (VISTARIL ) 25 MG capsule; Take 1 capsule (25 mg total) by mouth at bedtime as needed.  Dispense: 30 capsule; Refill: 3  3. H/O aortic valve replacement -  continue Coumadin   4. Chronic diastolic heart failure (HCC) -  no shortness of breath -   continue Demadex   5. Essential hypertension -   BP 126/78, stable -  continue Valsartan   6. Type II diabetes mellitus with peripheral autonomic neuropathy (HCC) -  A1C 6.5, 02/16/23 -  diet-controlled - Hemoglobin A1C - Lipid panel  7. Depression with anxiety -   PHQ-9 score 11, ranging as moderate depression -   declined referral to psychiatry -  does not want to take Cymbalta  and  Remeron  due to side-effects - sertraline  (ZOLOFT ) 50 MG tablet; Take 1 tablet (50 mg total) by mouth daily.  Dispense: 30 tablet; Refill: 3  8. Stage 3b chronic kidney disease (HCC) -  avoid over-the-counter medications -  will monitor -  follow up with nephrologist    Labs/tests ordered:  lipid panel and A1C  Next appt:  Visit date not found

## 2023-05-27 ENCOUNTER — Encounter: Payer: Self-pay | Admitting: Adult Health

## 2023-05-27 ENCOUNTER — Ambulatory Visit (INDEPENDENT_AMBULATORY_CARE_PROVIDER_SITE_OTHER): Payer: Medicare Other | Admitting: Adult Health

## 2023-05-27 DIAGNOSIS — Z0000 Encounter for general adult medical examination without abnormal findings: Secondary | ICD-10-CM

## 2023-05-27 LAB — LIPID PANEL
Cholesterol: 145 mg/dL (ref <200)
HDL: 41 mg/dL — ABNORMAL LOW (ref >=50)
LDL Cholesterol (Calc): 71 mg/dL
Non-HDL Cholesterol (Calc): 104 mg/dL (ref <130)
Total CHOL/HDL Ratio: 3.5 (calc) (ref <5.0)
Triglycerides: 253 mg/dL — ABNORMAL HIGH (ref <150)

## 2023-05-27 LAB — HEMOGLOBIN A1C
Hgb A1c MFr Bld: 7.1 %{Hb} — ABNORMAL HIGH (ref <5.7)
Mean Plasma Glucose: 157 mg/dL
eAG (mmol/L): 8.7 mmol/L

## 2023-05-27 MED ORDER — HYDROXYZINE PAMOATE 25 MG PO CAPS: 25.0000 mg | ORAL_CAPSULE | Freq: Every evening | ORAL | 3 refills | Status: DC | PRN

## 2023-05-27 MED ORDER — SERTRALINE HCL 50 MG PO TABS: 50.0000 mg | ORAL_TABLET | Freq: Every day | ORAL | 3 refills | Status: DC

## 2023-05-27 NOTE — Patient Instructions (Signed)
  Abigail Oliver , Thank you for taking time to come for your Medicare Wellness Visit. I appreciate your ongoing commitment to your health goals. Please review the following plan we discussed and let me know if I can assist you in the future.   These are the goals we discussed:  Goals   None     This is a list of the screening recommended for you and due dates:  Health Maintenance  Topic Date Due   Pneumonia Vaccine (1 of 2 - PCV) Never done   Zoster (Shingles) Vaccine (1 of 2) Never done   Flu Shot  12/23/2022   COVID-19 Vaccine (4 - 2024-25 season) 01/23/2023   Hemoglobin A1C  11/23/2023   Yearly kidney health urinalysis for diabetes  02/16/2024   Complete foot exam   02/16/2024   Eye exam for diabetics  03/17/2024   Yearly kidney function blood test for diabetes  03/30/2024   Medicare Annual Wellness Visit  05/26/2024   DTaP/Tdap/Td vaccine (4 - Td or Tdap) 03/16/2026   DEXA scan (bone density measurement)  Completed   Hepatitis C Screening  Completed   HPV Vaccine  Aged Out   Colon Cancer Screening  Discontinued

## 2023-05-27 NOTE — Progress Notes (Signed)
 I connected with  Abigail Oliver on 05/27/23 by a video enabled telemedicine application and verified that I am speaking with the correct person using two identifiers.   I discussed the limitations of evaluation and management by telemedicine. The patient expressed understanding and agreed to proceed.

## 2023-05-27 NOTE — Progress Notes (Signed)
 Subjective:   Abigail Oliver is a 78 y.o. female who presents for Medicare Annual (Subsequent) preventive examination.  Visit Complete: Virtual I connected with  Abigail Oliver on 05/27/23 by a video and audio enabled telemedicine application and verified that I am speaking with the correct person using two identifiers.  Patient Location: Home  Provider Location: Office/Clinic  I discussed the limitations of evaluation and management by telemedicine. The patient expressed understanding and agreed to proceed.  Vital Signs: Because this visit was a virtual/telehealth visit, some criteria may be missing or patient reported. Any vitals not documented were not able to be obtained and vitals that have been documented are patient reported.  Patient Medicare AWV questionnaire was completed by the patient on 05/27/23; I have confirmed that all information answered by patient is correct and no changes since this date.        Objective:    Today's Vitals   05/27/23 1127  PainSc: 0-No pain   There is no height or weight on file to calculate BMI.     05/27/2023   11:02 AM 05/26/2023    9:49 AM 03/31/2023    1:39 PM 03/30/2023    5:53 PM 02/16/2023    9:02 AM 12/13/2022    2:35 PM 12/10/2022    1:29 PM  Advanced Directives  Does Patient Have a Medical Advance Directive? No No Yes Yes No No No  Type of [employer] [place];Living will Living will;Healthcare Power of Attorney     Does patient want to make changes to medical advance directive?   No - Patient declined No - Patient declined     Copy of Healthcare Power of Attorney in Chart?   No - copy requested No - copy requested     Would patient like information on creating a medical advance directive? No - Patient declined No - Patient declined   No - Patient declined No - Patient declined No - Patient declined    Current Medications (verified) Outpatient Encounter Medications as of 05/27/2023  Medication Sig    Ascorbic Acid  (VITAMIN C ) 1000 MG tablet Take 1,000 mg by mouth every evening.   aspirin  EC 81 MG tablet Take 81 mg by mouth every evening.   Cholecalciferol (VITAMIN D3 PO) Take 1,000 Units by mouth daily.   cyanocobalamin  (VITAMIN B12) 1000 MCG tablet Take 1,000 mcg by mouth daily.   DULoxetine  (CYMBALTA ) 30 MG capsule Take 1 capsule (30 mg total) by mouth daily.   fluticasone  (FLONASE ) 50 MCG/ACT nasal spray Place 2 sprays into both nostrils daily. (Patient taking differently: Place 2 sprays into both nostrils daily as needed for allergies.)   folic acid  (FOLVITE ) 800 MCG tablet Take 800 mcg by mouth daily.   latanoprost  (XALATAN ) 0.005 % ophthalmic solution PLACE 1 DROP INTO BOTH EYES AT BEDTIME.   melatonin 5 MG TABS Take 1 tablet (5 mg total) by mouth at bedtime.   metoprolol  succinate (TOPROL  XL) 50 MG 24 hr tablet Take 1 tablet (50 mg total) by mouth daily. Take with or immediately following a meal. (Patient taking differently: Take 50 mg by mouth 2 (two) times daily. Take with or immediately following a meal.)   mirtazapine  (REMERON ) 15 MG tablet Take 1 tablet (15 mg total) by mouth at bedtime.   Multiple Vitamin (MULTIVITAMIN WITH MINERALS) TABS tablet Take 2 tablets by mouth daily.   nitroGLYCERIN  (NITROSTAT ) 0.4 MG SL tablet Place 1 tablet (0.4 mg total) under the  tongue every 5 (five) minutes as needed for chest pain.   potassium chloride  SA (KLOR-CON  M) 20 MEQ tablet Take 1 tablet (20 mEq total) by mouth daily.   rosuvastatin  (CRESTOR ) 10 MG tablet Take 10 mg by mouth every evening.   torsemide  (DEMADEX ) 20 MG tablet Take 1 tablet (20 mg total) by mouth daily.   valsartan  (DIOVAN ) 80 MG tablet 1 tablet Orally Once a day   warfarin (COUMADIN ) 3 MG tablet TAKE 1/2 TO 1 TABLET BY MOUTH DAILY AS DIRECTED (Patient taking differently: Take 1.5-3 mg by mouth as directed. TAKE 1 TABLET (3 MG) ON SAT, SUN, TUES, THURS & TAKE 1/2 TABLET (1.5 MG) on Mon, Wed, Friday)   No  facility-administered encounter medications on file as of 05/27/2023.    Allergies (verified) Atorvastatin , Dapagliflozin, Gabapentin , Simvastatin , Sulfa  antibiotics, and Iodinated contrast media   History: Past Medical History:  Diagnosis Date   Anemia    Anxiety    Aortic stenosis    a. Now has St Jude mechanical aortic valve (6/00). b. Echo (6/15) with EF 60-65%, mechanical aortic valve with mean gradient 27 mmHg.   Arthritis    a. Possible c-spine arthritis with pain down left arm.     Carotid artery disease (HCC)    a. Carotid dopplers (7/16) with 40-59% BICA stenosis.     Chronic angle-closure glaucoma(365.23)    Chronic diastolic CHF (congestive heart failure) (HCC)    a.  RHC (7/15) with mean RA 2, PA 22/11, mean PCWP 9, CI 3.79.   Contrast media allergy    Coronary atherosclerosis of native coronary artery    a. CABG at time of AVR in 6/00 with RIMA-RCA. b. Abnl nuc 11/2013 -> LHC (7/15) with patent RIMA-RCA, 80% mRCA, 80% pLAD with FFR 0.73, treated with DES to pLAD.   Depression    Essential hypertension    GERD (gastroesophageal reflux disease)    Hyperlipidemia    Low back pain    Neuromuscular disorder (HCC)    neuropathy   Peripheral vascular disease (HCC)    a, H/o Right SFA stent. b. Peripheral arterial dopplers (7/16) with right SFA stent patent.     PONV (postoperative nausea and vomiting)    N&V   Type II diabetes mellitus (HCC)    Past Surgical History:  Procedure Laterality Date   BILATERAL OOPHORECTOMY  05/24/1985   BREAST BIOPSY  05/24/2010   left   CARDIAC CATHETERIZATION  01/14/1999   normal LV function, severe aortic stenosis; 80% and 70% stenosis in RCA; mild 20% distal norrowing in L main with 20% proximal LAD stenosis, 40% diagonal stenosis and 20% proximal circumflex stenosis   CARDIAC VALVE REPLACEMENT  05/24/1998   aortic valve    CARDIOVASCULAR STRESS TEST  08/25/2011   R/L MV - EF 72%; no scintigraphic evidence of inducible MI; normal  perfusionTID of 1.25 elevated - could indicate small vessle subendocardial ischemia; EKG NSR at 66, non diagnostic for ischemia   CARPAL TUNNEL RELEASE  05/25/1987   bilaterally   CATARACT EXTRACTION W/ INTRAOCULAR LENS  IMPLANT, BILATERAL  ~ 2007   CESAREAN SECTION  1969; 1971   CHOLECYSTECTOMY  05/24/1988   COLONOSCOPY N/A 10/27/2012   Procedure: COLONOSCOPY;  Surgeon: Belvie JONETTA Just, MD;  Location: [facility];  Service: Endoscopy;  Laterality: N/A;   CORONARY ARTERY BYPASS GRAFT  05/24/1998   RIMA to RCA   CORONARY/GRAFT ANGIOGRAPHY N/A 09/07/2021   Procedure: CORONARY/GRAFT ANGIOGRAPHY;  Surgeon: Wendel Lurena POUR, MD;  Location:  [facility];  Service: Cardiovascular;  Laterality: N/A;   DOPPLER ECHOCARDIOGRAPHY  03/29/2012   EF >55%; mild concentric LVH; stage 1 diastolic dysfunction, elevated LV filling pressure, dilated LA; MAC mild MR; St Jude AVR peak and mean gradients of and ; transvalvular gradients have increased (prev 23 and 14 respectively)   ESOPHAGOGASTRODUODENOSCOPY N/A 10/27/2012   Procedure: ESOPHAGOGASTRODUODENOSCOPY (EGD);  Surgeon: Belvie JONETTA Just, MD;  Location: [facility];  Service: Endoscopy;  Laterality: N/A;   FEMORAL ARTERY STENT  09/20/2011   6 x 40 Smart Nitinol self-expanding stent placed;  10/15/2031 -R SFA stent open and patent w/o evidence of restenosis   FRACTIONAL FLOW RESERVE WIRE  12/14/2013   Procedure: FRACTIONAL FLOW RESERVE WIRE;  Surgeon: Ezra GORMAN Shuck, MD;  Location: [facility] CATH LAB;  Service: Cardiovascular;;   KNEE SURGERY Left    LACERATION REPAIR     right hand   LEFT AND RIGHT HEART CATHETERIZATION WITH CORONARY ANGIOGRAM N/A 12/14/2013   Procedure: LEFT AND RIGHT HEART CATHETERIZATION WITH CORONARY ANGIOGRAM;  Surgeon: Ezra GORMAN Shuck, MD;  Location: [facility] CATH LAB;  Service: Cardiovascular;  Laterality: N/A;   LOWER EXTREMITY ANGIOGRAM N/A 09/20/2011   Procedure: LOWER EXTREMITY ANGIOGRAM;  Surgeon: Dorn JINNY Lesches, MD;   Location: [facility] CATH LAB;  Service: Cardiovascular;  Laterality: N/A;   LYMPH NODE BIOPSY  06/25/2011   core needle on 5   needle biopsy  05/25/2007   on ankles for nerve damage   NEUROPLASTY / TRANSPOSITION ULNAR NERVE AT ELBOW     right   ORIF FEMUR FRACTURE Left 09/09/2021   Procedure: OPEN REDUCTION INTERNAL FIXATION (ORIF) DISTAL FEMUR FRACTURE;  Surgeon: Kendal Franky SQUIBB, MD;  Location: [facility] OR;  Service: Orthopedics;  Laterality: Left;   PERCUTANEOUS CORONARY STENT INTERVENTION (PCI-S)  12/14/2013   Procedure: PERCUTANEOUS CORONARY STENT INTERVENTION (PCI-S);  Surgeon: Ezra GORMAN Shuck, MD;  Location: [facility] CATH LAB;  Service: Cardiovascular;;  Prox LAD 3.00x12 Promus DES    PERIPHERAL ARTERIAL STENT GRAFT  09/20/2011   right SFA   REFRACTIVE SURGERY  ` 2004   for glaucoma   TONSILLECTOMY  05/24/1966   TRIGGER FINGER RELEASE Left 12/04/2015   Procedure: LEFT RING FINGER TRIGGER RELEASE;  Surgeon: Franky Curia, MD;  Location: [facility];  Service: Orthopedics;  Laterality: Left;   VAGINAL HYSTERECTOMY  05/25/1983   Fibroids   Family History  Problem Relation Age of Onset   Colon cancer Mother    COPD Mother    Emphysema Mother    Cancer Maternal Grandmother    Cancer Maternal Grandfather    Heart disease Brother    Diabetes Neg Hx    Social History   Socioeconomic History   Marital status: Married    Spouse name: Fairy   Number of children: 2   Years of education: 14   Highest education level: Not on file  Occupational History    Comment: producer, television/film/video  Tobacco Use   Smoking status: Never   Smokeless tobacco: Never  Vaping Use   Vaping status: Never Used  Substance and Sexual Activity   Alcohol  use: Not Currently   Drug use: Not Currently   Sexual activity: Never  Other Topics Concern   Not on file  Social History Narrative   Pt is a high school graduate with 2 years of college. Married in 1967 she has 1 son born 58 and 1 daughter born 17 and 1  grandchild. 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      Caffeine use- 2-3 /day, soda, tea       Lives with husband one story home   Social Drivers of Health   Financial Resource Strain: Not on file  Food Insecurity: No Food Insecurity (03/31/2023)   Hunger Vital Sign    Worried About Running Out of Food in the Last Year: Never true    Ran Out of Food in the Last Year: Never true  Transportation Needs: No Transportation Needs (03/31/2023)   PRAPARE - Administrator, Civil Service (Medical): No    Lack of Transportation (Non-Medical): No  Physical Activity: Not on file  Stress: Not on file  Social Connections: Not on file    Tobacco Counseling Counseling given: Not Answered   Clinical Intake:  Pre-visit preparation completed: No  Pain Score: 0-No pain     BMI - recorded: 32.92 Nutritional Status: BMI > 30  Obese Diabetes: Yes (7.1, 05/26/23) CBG done?: No Did pt. bring in CBG monitor from home?: No  How often do you need to have someone help you when you read instructions, pamphlets, or other written materials from your doctor or pharmacy?: 1 - Never What is the last grade level you completed in school?: 2-years of college  Interpreter Needed?: No  Information entered by :: Chirstina Haan Medina-Vargas DNP   Activities of Daily Living    05/27/2023   11:32 AM 03/31/2023    1:42 PM  In your present state of health, do you have any difficulty performing the following activities:  Hearing? 1   Vision? 0   Difficulty concentrating or making decisions? 0   Walking or climbing stairs? 1   Comment uses cane, holds onto husband or son's arm   Dressing or bathing? 0   Doing errands, shopping? 1 0    Patient Care Team: Oliver, Jereld BROCKS, NP as PCP - General (Internal Medicine) Burnard Debby LABOR, MD (Cardiology) Sypher, Lamar, MD (Inactive) (Orthopedic Surgery) Frutoso Duos, MD (Ophthalmology)  Indicate any recent Medical Services  you may have received from other than [facility] providers in the past year (date may be approximate).     Assessment:   This is a routine wellness examination for Abigail Oliver.  Hearing/Vision screen No results found.   Goals Addressed   None   Depression Screen    05/26/2023    9:46 AM 05/09/2023    9:51 AM 02/16/2023    9:03 AM 01/07/2023    9:45 AM 12/13/2022    2:35 PM 12/10/2022    1:48 PM 12/07/2022   12:30 PM  PHQ 2/9 Scores  PHQ - 2 Score 2  6  6 6    PHQ- 9 Score 11  24  19 18       Information is confidential and restricted. Go to Review Flowsheets to unlock data.    Fall Risk    05/26/2023    9:46 AM 02/16/2023    9:03 AM 12/10/2022    1:48 PM 08/05/2022    9:59 AM 04/26/2022   12:20 PM  Fall Risk   Falls in the past year? 0 1 1 0 1  Number falls in past yr: 0 1 1 0 0  Injury with Fall? 0 0 1 0 1  Risk for fall due to : History of fall(s) History of fall(s) History of fall(s) No Fall Risks History of fall(s)  Follow up Falls evaluation completed;Education provided;Falls prevention discussed Falls evaluation completed Falls evaluation  completed  Falls evaluation completed    MEDICARE RISK AT HOME:    TIMED UP AND GO:  Was the test performed?  No    Cognitive Function:        Immunizations Immunization History  Administered Date(s) Administered   Influenza, High Dose Seasonal PF 03/16/2016   Influenza-Unspecified 02/11/2014, 02/25/2015, 04/07/2020   PFIZER(Purple Top)SARS-COV-2 Vaccination 06/28/2019, 07/23/2019, 03/25/2020   Td 07/22/2005   Td (Adult), 2 Lf Tetanus Toxid, Preservative Free 07/22/2005   Tdap 03/16/2016    TDAP status: Up to date  Flu Vaccine status: Declined, Education has been provided regarding the importance of this vaccine but patient still declined. Advised may receive this vaccine at local pharmacy or Health Dept. Aware to provide a copy of the vaccination record if obtained from local pharmacy or Health Dept. Verbalized acceptance and  understanding.  Pneumococcal vaccine status: Declined,  Education has been provided regarding the importance of this vaccine but patient still declined. Advised may receive this vaccine at local pharmacy or Health Dept. Aware to provide a copy of the vaccination record if obtained from local pharmacy or Health Dept. Verbalized acceptance and understanding.   Covid-19 vaccine status: Completed vaccines  Qualifies for Shingles Vaccine? Yes   Zostavax completed No   Shingrix Completed?: No.    Education has been provided regarding the importance of this vaccine. Patient has been advised to call insurance company to determine out of pocket expense if they have not yet received this vaccine. Advised may also receive vaccine at local pharmacy or Health Dept. Verbalized acceptance and understanding.  Screening Tests Health Maintenance  Topic Date Due   Pneumonia Vaccine 59+ Years old (1 of 2 - PCV) Never done   Zoster Vaccines- Shingrix (1 of 2) Never done   INFLUENZA VACCINE  12/23/2022   COVID-19 Vaccine (4 - 2024-25 season) 01/23/2023   HEMOGLOBIN A1C  11/23/2023   Diabetic kidney evaluation - Urine ACR  02/16/2024   FOOT EXAM  02/16/2024   OPHTHALMOLOGY EXAM  03/17/2024   Diabetic kidney evaluation - eGFR measurement  03/30/2024   Medicare Annual Wellness (AWV)  05/26/2024   DTaP/Tdap/Td (4 - Td or Tdap) 03/16/2026   DEXA SCAN  Completed   Hepatitis C Screening  Completed   HPV VACCINES  Aged Out   Colonoscopy  Discontinued    Health Maintenance  Health Maintenance Due  Topic Date Due   Pneumonia Vaccine 72+ Years old (1 of 2 - PCV) Never done   Zoster Vaccines- Shingrix (1 of 2) Never done   INFLUENZA VACCINE  12/23/2022   COVID-19 Vaccine (4 - 2024-25 season) 01/23/2023    Colorectal cancer screening: Type of screening: Colonoscopy. Completed 2014. Repeat every 10 years  Mammogram status: Completed 01/2023. Repeat every year  Bone Density status: Completed 01/2023. Results  reflect: Bone density results: NORMAL. Repeat every 5 years.  Lung Cancer Screening: (Low Dose CT Chest recommended if Age 22-80 years, 20 pack-year currently smoking OR have quit w/in 15years.) does not qualify.   Lung Cancer Screening Referral: No  Additional Screening:  Hepatitis C Screening: does qualify; Completed will get it done next time she comes to office  Vision Screening: Recommended annual ophthalmology exams for early detection of glaucoma and other disorders of the eye. Is the patient up to date with their annual eye exam?  Yes  Who is the provider or what is the name of the office in which the patient attends annual eye exams? Dr. Macie If pt  is not established with a provider, would they like to be referred to a provider to establish care? No .   Dental Screening: Recommended annual dental exams for proper oral hygiene  Diabetic Foot Exam: Diabetic Foot Exam: Completed 03/2023  Community Resource Referral / Chronic Care Management: CRR required this visit?  No   CCM required this visit?  No     Plan:     I have personally reviewed and noted the following in the patient's chart:   Medical and social history Use of alcohol , tobacco or illicit drugs  Current medications and supplements including opioid prescriptions. Patient is not currently taking opioid prescriptions. Functional ability and status Nutritional status Physical activity Advanced directives List of other physicians Hospitalizations, surgeries, and ER visits in previous 12 months Vitals Screenings to include cognitive, depression, and falls Referrals and appointments  In addition, I have reviewed and discussed with patient certain preventive protocols, quality metrics, and best practice recommendations. A written personalized care plan for preventive services as well as general preventive health recommendations were provided to patient.     Abigail Whistler Medina-Vargas, NP   05/27/2023   After  Visit Summary: (Mail) Due to this being a telephonic visit, the after visit summary with patients personalized plan was offered to patient via mail   Nurse Notes: will need hep C, and needs AWV yearly

## 2023-05-27 NOTE — Progress Notes (Signed)
 This service is provided via telemedicine  No vital signs collected/recorded due to the encounter was a telemedicine visit.   Location of patient (ex: home, work):  Home   Patient consents to a telephone visit:  Yes, 05/27/23   Location of the provider (ex: office, home):  [facility]  Name of any referring provider:  Medina-Vargas, Monina C, NP   Names of all persons participating in the telemedicine service and their role in the encounter: Alvaro Aungst B/CMA, Medina-Vargas, Monina C, NP , and patient  Time spent on call:  11 minutes

## 2023-05-27 NOTE — Progress Notes (Signed)
-    A1C 7.1, up from 6.5 3 months ago, stated that she was chewing gum during lab draw -  cholesterol improved, continue current regimen

## 2023-06-06 ENCOUNTER — Encounter: Payer: Self-pay | Admitting: Professional Counselor

## 2023-06-06 ENCOUNTER — Ambulatory Visit: Payer: Medicare Other | Admitting: Professional Counselor

## 2023-06-06 DIAGNOSIS — F330 Major depressive disorder, recurrent, mild: Secondary | ICD-10-CM

## 2023-06-06 DIAGNOSIS — F411 Generalized anxiety disorder: Secondary | ICD-10-CM

## 2023-06-06 NOTE — Progress Notes (Signed)
      [facility] Counselor/Therapist Progress Note  Patient ID: Abigail Oliver, MRN: [id number],    Date: 06/06/2023  Time Spent: 1:08 PM to 2:06 PM  Treatment Type: Individual Therapy  Reported Symptoms: Worries, low mood, restlessness, stress, interpersonal concerns  Mental Status Exam:  Appearance:   Neat     Behavior:  Appropriate, Sharing, and Motivated  Motor:  Normal  Speech/Language:   Clear and Coherent and Normal Rate  Affect:  Appropriate and Congruent  Mood:  normal  Thought process:  normal  Thought content:    WNL  Sensory/Perceptual disturbances:    WNL  Orientation:  oriented to person, place, time/date, and situation  Attention:  Good  Concentration:  Good  Memory:  WNL  Fund of knowledge:   Good  Insight:    Good  Judgment:   Good  Impulse Control:  Good   Risk Assessment: Danger to Self:  No Self-injurious Behavior: No Danger to Others: No Duty to Warn:no Physical Aggression / Violence:No  Access to Firearms a concern: No  Gang Involvement:No   Subjective: Patient presented to session to address concerns of anxiety and depression.  Patient voiced progress since last session.  She reported her work and pay issue to have been resolved, and for complaint to not be necessary.  She also reported a day out with her husband, daughter and son for the holidays, and while there was conflict between son and daughter, she was grateful for family time together.  She processed experience of having set a boundary with her daughter that worked, that she gave a loan but had her daughter sign an IOU, and was paid back.  She voiced a sense of progress for her daughter and that she has been facing reality of her circumstances, and patient voiced herself and her husband to be trying to help in practical not enabling ways such as helping to clean out her house.  Patient reflected that she has felt inspired and that she has gained ground and implementing boundaries.  Patient  reported having seen her primary care provider to have medication reviewed.  Counselor actively listened, affirmed patient proactive efforts for self-care and self advocacy, helped to facilitate insight and reinforce patient positivity.   [name], [facility]   Interventions: Solution-Oriented/Positive Psychology, Humanistic/Existential, and Insight-Oriented  Diagnosis:   ICD-10-CM   1. Generalized anxiety disorder  F41.1     2. Major depressive disorder, recurrent episode, mild (HCC)  F33.0       Plan: Patient is scheduled for follow-up; continue process work and developing coping skills.  Patient short-term goal to continue to implement boundaries and practice assertiveness and having her own needs met.  Progress note was dictated with Dragon and reviewed for accuracy.  [name], [facility]

## 2023-06-07 ENCOUNTER — Telehealth: Payer: Self-pay | Admitting: *Deleted

## 2023-06-07 NOTE — Telephone Encounter (Signed)
 Patient called and stated that a company keeps calling her wanting to send her a Back Brace and a Knee Brace. Stated that the company told her that the Dr. Lawerance ordered it and that Medicare will pay for it.    Patient DOES NOT want this and wanted to inform us .   FYI

## 2023-06-07 NOTE — Telephone Encounter (Signed)
 Medina-Vargas, Monina C, NP  You6 minutes ago (4:05 PM)   Noted. I did not order them. FYI.

## 2023-06-17 ENCOUNTER — Ambulatory Visit: Payer: Medicare Other

## 2023-06-17 ENCOUNTER — Telehealth: Payer: Self-pay

## 2023-06-17 NOTE — Telephone Encounter (Signed)
Lpmtcb to reschedule INR appt

## 2023-06-18 NOTE — Progress Notes (Signed)
 Cardiology Office Note:    Date:  06/28/2023   ID:  Abigail Oliver, DOB 06/18/1945, MRN [id number]  PCP:  [name], NP  Cardiologist:  Darryle ONEIDA Decent, MD     Referring MD: [name]*   Chief Complaint: routine follow-up of CAD and CHF  History of Present Illness:    Abigail Oliver is a 78 y.o. female with a history of CAD s/p remote CABG x1 (RIMA to PDA) in 05/1998 with subsequent PCI to LAD in 2015, chronic HFpEF, aortic stenosis s/p mechanical AVR in 05/1998 at time of CABG on Coumadin , PAD s/p right SFA stenting in 2013, carotid stenosis, hypertension, hyperlipidemia, type 2 diabetes mellitus, CKD stage IIIb, and GERD who is followed by Dr. Decent and Dr. Court and presents today for routine follow-up.   Last ischemic evaluation was a cardiac catheterization in 08/2021 which showed patent RIMA to PDA and patent proximal LAD stent as well as 40% stenosis of distal left main and ostial LAD and 50% mid LAD disease. Continued medical therapy was recommended. Last Echo in 08/2022 showed LVEF of 60-65% with normal wall motion and mild LVH, mildly reduced RV function, severe left atrial enlargement, normal structure and function of mechanical AVR, and mild MR. Last lower extremity arterial dopplers in 12/2022 showed patent right SFA stenting with 50-74% stenosis in the right SFA  and/ or popliteal artery but prior stent patent. ABIs/ TBIs were normal bilaterally.   She was last seen by Dr. Decent in 11/2022 at which time as doing well from a cardiac standpoint.  She was admitted in 03/2023 for left sided chest pain. Work-up was unremarkable. High-sensitivity troponin was negative x4. She was seen by Cardiology and pain was felt to be very atypical (likely musculoskeletal in nature). No additional cardiac work-up was felt to be necessary.   Patient presents today for follow-up. No recurrent chest pain. She does report new dyspnea on exertion for the last couple of  weeks such as when walking from one room to another in her house. She has chronic orthopnea and sleeps on a 30 degree angle. She cannot remember how long she has been doing this for. She denies any PND. She has some chronic lower extremity edema which is stable. She does not weight herself daily. She normally weighs herself about once a week and states he weight typically fluctuates 1-2 lbs. However, weight is up 13 lbs since last visit in 11/2022. She states she is watching her sodium intake but suspect she is getting more than she thinks. She reports eating out twice a week and then states she eats a lot of spaghetti, meatballs, and sandwiches. She has some palpitations when she is anxious but nothing outside of these times. She does describe some lightheadedness/ dizziness and unsteadiness with quick position changes. No syncope.   She does report a couple of falls during the the past few years. Most recent fall was last Friday 06/24/2023. She is not sure what caused her to falls. She states she bent down to pick of her shoe to fix the shoelace. She stood back up at a counter and suddenly fell backwards landing on her back. She states she hit her head on a soft basket of clothes but did not hit her head on anything heard. She denies any LOC. She denies any palpitations or lightheadedness/ dizziness prior to this. Last fall prior to this most recent one was in 08/2021 when she fractured her femur.  EKGs/Labs/Other  Studies Reviewed:    The following studies were reviewed:  Cardiac Catheterization 09/07/2021:   Prox LAD lesion is 30% stenosed.   Mid LAD lesion is 50% stenosed.   Dist LM to Ost LAD lesion is 40% stenosed.   Dist RCA lesion is 100% stenosed.   1.  Patent proximal LAD stent. 2.  Patent RIMA to PDA. 3.  Moderate distal left main and mid LAD disease. 4.  Left heart catheterization was deferred due to presence of mechanical aortic valve replacement.   Recommendations: Results reviewed with  Dr. Dann.  No intervention was pursued and the patient will be referred for urgent orthopedic surgery.  Diagnostic Dominance: Right    _______________  Echocardiogram 08/25/2022: Impressions:  1. Left ventricular ejection fraction, by estimation, is 60 to 65%. Left  ventricular ejection fraction by 3D volume is 62 %. The left ventricle has  normal function. The left ventricle has no regional wall motion  abnormalities. There is mild left  ventricular hypertrophy. Left ventricular diastolic parameters are  indeterminate.   2. Right ventricular systolic function is mildly reduced. The right  ventricular size is normal. There is normal pulmonary artery systolic  pressure. The estimated right ventricular systolic pressure is 34.0 mmHg.   3. Left atrial size was severely dilated.   4. The mitral valve is degenerative. Mild mitral valve regurgitation. No  evidence of mitral stenosis. Moderate mitral annular calcification.   5. The aortic valve has been repaired/replaced. Aortic valve  regurgitation is trivial. There is a unknown [employer] mechanical valve  present in the aortic position. Procedure Date: 10/1998. Echo findings are  consistent with normal structure and function  of the aortic valve prosthesis. Aortic valve area, by VTI measures 1.69  cm, iEOA 0.96 cm2/m2. Aortic valve mean gradient measures 15.7 mmHg.  Aortic valve Vmax measures 2.70 m/s. Aortic valve acceleration time  measures 102 msec. AT/ET 0.30, DVI 0.41.   6. Aortic dilatation noted. There is borderline dilatation of the  ascending aorta, measuring 38 mm.   7. The inferior vena cava is normal in size with <50% respiratory  variability, suggesting right atrial pressure of 8 mmHg.  _______________  Lower Extremity Arterial Ultrasound / ABIs 12/31/2022: Summary: Right: 50-74% stenosis noted in the superficial femoral artery. 50-74%  stenosis noted in the superficial femoral artery and/or popliteal artery.  Patent  right SFA stent.   ABI Summary: Right: Resting right ankle-brachial index is within normal range. The  right toe-brachial index is normal.   Left: Resting left ankle-brachial index is within normal range. The left  toe-brachial index is normal.    EKG:  EKG not ordered today.   Recent Labs: 03/30/2023: ALT 27 03/31/2023: B Natriuretic Peptide 134.3 06/24/2023: BUN 35; Creat 1.49; Hemoglobin 10.2; Platelets 114; Potassium 3.9; Sodium 138  Recent Lipid Panel    Component Value Date/Time   CHOL 145 05/26/2023 1041   CHOL 144 01/30/2021 0857   TRIG 253 (H) 05/26/2023 1041   HDL 41 (L) 05/26/2023 1041   HDL 43 01/30/2021 0857   CHOLHDL 3.5 05/26/2023 1041   VLDL 44 (H) 01/17/2019 1038   LDLCALC 71 05/26/2023 1041   LDLDIRECT 138.0 10/02/2014 0958    Physical Exam:    Vital Signs: BP (!) 144/60   Pulse 70   Ht 5' 2 (1.575 m)   Wt 182 lb (82.6 kg)   SpO2 97%   BMI 33.29 kg/m     Wt Readings from Last 3 Encounters:  06/28/23 182  lb (82.6 kg)  06/24/23 182 lb 6.4 oz (82.7 kg)  05/26/23 180 lb (81.6 kg)     General: 78 y.o. Caucasian female in no acute distress. HEENT: Normocephalic and atraumatic. Sclera clear.  Neck: Supple. Carotid bruits bilaterally (right > left). No JVD. Heart: RRR. Mechanical valve click. II/VI systolic murmur noted. Lungs: No increased work of breathing. Clear to ausculation bilaterally. No wheezes, rhonchi, or rales.  Abdomen: Soft, non-distended, and non-tender to palpation.  Extremities: No lower extremity edema.  Skin: Warm and dry. Erythematous plaques noted on lower and upper extremities (secondary to granuloma annulare) Neuro: No focal deficits. Psych: Normal affect. Responds appropriately.   Assessment:    1. Coronary artery disease involving native coronary artery of native heart without angina pectoris   2. SOB (shortness of breath)   3. Chronic heart failure with preserved ejection fraction (HFpEF) (HCC)   4. Aortic valve stenosis  s/p AVR   5. Mild mitral regurgitation   6. PAD (peripheral artery disease) (HCC)   7. Bilateral carotid artery stenosis   8. Primary hypertension   9. Hyperlipidemia, unspecified hyperlipidemia type   10. Type 2 diabetes mellitus with obesity (HCC)   11. Stage 3b chronic kidney disease (HCC)   12. Falls     Plan:    CAD S/p CABG with RIMA to PDA in 2000 and subsequent PCI to LAD in 2015. Last LHC in 06/2021 showed patent RIMA to PDA and patent proximal LAD stent as well as 40% stenosis of distal left main and ostial LAD and 50% mid LAD disease.  - No chest pain.  - Continue aspirin  and statin.   Dyspnea on Exertion Chronic HFpEF Last Echo in 08/2022 showed LVEF of 60-65% with normal wall motion and mild LVH, mildly reduced RV function, severe left atrial enlargement, normal structure and function of mechanical AVR, and mild MR. - She describes new dyspnea on exertion over the last couple of weeks.  - She does not look significantly volume overloaded on exam. Weight is up 13 lbs from last visit in 11/2022. - Plan was to repeat Echo in 08/2023 for routine monitoring of valvular disease. Will go ahead and repeat now given new dyspnea. - Will check BNP. Recently had BMET checked last week. - Continue Torsemide  20mg  daily.  If BNP is elevated, will likely increase Torsemide  for a few days. - Continue daily weights and sodium/ fluid restrictions.   Aortic Stenosis s/p Mechanical AVR Mild Mitral Regurgitation S/p mechanical AVR in 2000 at time of CABG. Last Echo in 08/2022 showed normal structure and function of mechanical AVR as well as mild MR.  - Will repeat Echo.  PAD S/p stenting to right SFA in 2013. Last lower extremity arterial dopplers in 12/2022 showed patent right SFA stenting with 50-74% stenosis in the right SFA  and/ or popliteal artery but prior stent patent. ABIs/ TBIs were normal bilaterally.  - Continue aspirin  and statin. - Followed by Dr. Court. She has not seen him since  2023 so will arrange follow-up.   Carotid Stenosis Prior dopplers in 2018 showed 40-59% stenosis of right ICA. However, most recent dopplers in 2020 showed only mild disease (1-39% stenosis) of bilateral ICAs. - Bilateral bruits noted on exam (right > left). - Continue aspirin  and statin. - Will repeat carotid dopplers.  Hypertension BP mildly elevated.  - Continue current medications: Toprol -XL 50mg  daily and Valsartan  80mg  daily.  - She does describes some lightheadedness/ dizziness with position changes and had a recent  falls. Orthostatics negative in the office today. However, will continue current medications for now. Can consider making medication changes at next visit depending on BP and symptoms.  Hyperlipidemia Recent lipid panel in 05/2023: Total Cholesterol 145, Triglycerides 253, HDL 41, LDL 71. LDL goal <55. - Currently on Crestor  10mg  daily. Will increase to 20mg  daily.  - Can repeat lipid panel and LFTs at follow-up visit in 2 months.    Type 2 Diabetes Mellitus Hemoglobin A1c 7.1% in 05/2023. - Management per PCP.  CKD Stage IIIb Recent baseline creatinine around 1.3 to 1.6. Stable at 1.49 in 06/24/2023.  Falls Patient reports a couple of falls over the last few years. Most recent fall was last week. Occurred after bending over and then standing up. However, she denies any lightheadedness/ dizziness prior to the fall and is not sure what happened. Not clearly mechanical.  - Orthostatic vital signs negative today.  - Patient is somewhat of a difficult historian. Will get a 30 day event monitor to make sure she is not having any significant arrhythmias.   Disposition: Follow up in 2 months after Echo.   Signed, Aline FORBES Door, PA-C  06/28/2023 1:25 PM    [facility]

## 2023-06-20 ENCOUNTER — Ambulatory Visit: Payer: Medicare Other | Admitting: Professional Counselor

## 2023-06-20 ENCOUNTER — Encounter: Payer: Self-pay | Admitting: Professional Counselor

## 2023-06-20 DIAGNOSIS — F411 Generalized anxiety disorder: Secondary | ICD-10-CM | POA: Diagnosis not present

## 2023-06-20 DIAGNOSIS — F330 Major depressive disorder, recurrent, mild: Secondary | ICD-10-CM

## 2023-06-20 NOTE — Progress Notes (Addendum)
      [facility] Counselor/Therapist Progress Note  Patient ID: Abigail Oliver, MRN: [id number],    Date: 06/20/2023  Time Spent: 9:15 AM to 10:10 AM  Treatment Type: Individual Therapy  Reported Symptoms: stress, trouble relaxing, nervousness, caregiver strain, irritability, worries, interpersonal concerns, stress, restlessness, sleeplessness  Mental Status Exam:  Appearance:   Neat     Behavior:  Appropriate, Sharing, and Motivated  Motor:  Normal  Speech/Language:   Clear and Coherent and Normal Rate  Affect:  Appropriate and Congruent  Mood:  normal  Thought process:  normal  Thought content:    WNL  Sensory/Perceptual disturbances:    WNL  Orientation:  oriented to person, place, time/date, and situation  Attention:  Good  Concentration:  Good  Memory:  WNL  Fund of knowledge:   Good  Insight:    Good  Judgment:   Good  Impulse Control:  Good   Risk Assessment: Danger to Self:  No Self-injurious Behavior: No Danger to Others: No Duty to Warn:no Physical Aggression / Violence:No  Access to Firearms a concern: No  Gang Involvement:No   Subjective: Patient presented to session to address concerns of anxiety and depression.  Patient reported mixed progress at this time.  She continued to process frustration and grief around daughters choices and trajectory, and counselor actively listened and affirmed patient feelings and experience.  Counselor encouraged patient engagement with Al-Anon, and patient voiced a negative experience of the past but potential to try again.  Patient voiced concerns of her husband's comprehension issues, and her own health concerns.  Counselor and patient continued to discuss patient interest in a class at the senior center as a social and creative outlet.  Interventions: Assertiveness/Communication, Solution-Oriented/Positive Psychology, Humanistic/Existential, and Insight-Oriented  Diagnosis:   ICD-10-CM   1. Generalized anxiety disorder   F41.1     2. Major depressive disorder, recurrent episode, mild (HCC)  F33.0       Plan: Patient is scheduled for follow-up; continue process work and developing coping skills.  Patient to continue to be mindful of prioritizing self-care and implementing boundaries where indicated.  Progress note was dictated with Dragon and reviewed for accuracy.  Gaspar Bidding, [facility]

## 2023-06-21 ENCOUNTER — Other Ambulatory Visit: Payer: Self-pay | Admitting: Cardiovascular Disease

## 2023-06-21 DIAGNOSIS — I5032 Chronic diastolic (congestive) heart failure: Secondary | ICD-10-CM

## 2023-06-24 ENCOUNTER — Ambulatory Visit (INDEPENDENT_AMBULATORY_CARE_PROVIDER_SITE_OTHER): Payer: Medicare Other | Admitting: Adult Health

## 2023-06-24 ENCOUNTER — Encounter: Payer: Self-pay | Admitting: Adult Health

## 2023-06-24 ENCOUNTER — Other Ambulatory Visit: Payer: Medicare Other

## 2023-06-24 VITALS — BP 126/60 | HR 67 | Temp 96.4°F | Resp 17 | Ht 62.0 in | Wt 182.4 lb

## 2023-06-24 DIAGNOSIS — I129 Hypertensive chronic kidney disease with stage 1 through stage 4 chronic kidney disease, or unspecified chronic kidney disease: Secondary | ICD-10-CM | POA: Diagnosis not present

## 2023-06-24 DIAGNOSIS — F5101 Primary insomnia: Secondary | ICD-10-CM

## 2023-06-24 DIAGNOSIS — L920 Granuloma annulare: Secondary | ICD-10-CM

## 2023-06-24 DIAGNOSIS — Z952 Presence of prosthetic heart valve: Secondary | ICD-10-CM

## 2023-06-24 DIAGNOSIS — E1143 Type 2 diabetes mellitus with diabetic autonomic (poly)neuropathy: Secondary | ICD-10-CM | POA: Diagnosis not present

## 2023-06-24 DIAGNOSIS — K219 Gastro-esophageal reflux disease without esophagitis: Secondary | ICD-10-CM

## 2023-06-24 DIAGNOSIS — I10 Essential (primary) hypertension: Secondary | ICD-10-CM

## 2023-06-24 DIAGNOSIS — F418 Other specified anxiety disorders: Secondary | ICD-10-CM

## 2023-06-24 DIAGNOSIS — H401134 Primary open-angle glaucoma, bilateral, indeterminate stage: Secondary | ICD-10-CM | POA: Diagnosis not present

## 2023-06-24 DIAGNOSIS — I5032 Chronic diastolic (congestive) heart failure: Secondary | ICD-10-CM | POA: Diagnosis not present

## 2023-06-24 DIAGNOSIS — N1832 Chronic kidney disease, stage 3b: Secondary | ICD-10-CM

## 2023-06-24 LAB — CBC WITH DIFFERENTIAL/PLATELET
Absolute Lymphocytes: 1479 {cells}/uL (ref 850–3900)
Absolute Monocytes: 392 {cells}/uL (ref 200–950)
Basophils Absolute: 53 {cells}/uL (ref 0–200)
Basophils Relative: 1 %
Eosinophils Absolute: 90 {cells}/uL (ref 15–500)
Eosinophils Relative: 1.7 %
HCT: 32.3 % — ABNORMAL LOW (ref 35.0–45.0)
Hemoglobin: 10.2 g/dL — ABNORMAL LOW (ref 11.7–15.5)
MCH: 27.7 pg (ref 27.0–33.0)
MCHC: 31.6 g/dL — ABNORMAL LOW (ref 32.0–36.0)
MCV: 87.8 fL (ref 80.0–100.0)
MPV: 11.1 fL (ref 7.5–12.5)
Monocytes Relative: 7.4 %
Neutro Abs: 3286 {cells}/uL (ref 1500–7800)
Neutrophils Relative %: 62 %
Platelets: 114 10*3/uL — ABNORMAL LOW (ref 140–400)
RBC: 3.68 10*6/uL — ABNORMAL LOW (ref 3.80–5.10)
RDW: 14.7 % (ref 11.0–15.0)
Total Lymphocyte: 27.9 %
WBC: 5.3 10*3/uL (ref 3.8–10.8)

## 2023-06-24 MED ORDER — PANTOPRAZOLE SODIUM 40 MG PO TBEC: 40.0000 mg | DELAYED_RELEASE_TABLET | Freq: Every day | ORAL | Status: AC

## 2023-06-24 MED ORDER — LORAZEPAM 0.5 MG PO TABS: 0.5000 mg | ORAL_TABLET | Freq: Every evening | ORAL | 0 refills | Status: DC | PRN

## 2023-06-24 NOTE — Progress Notes (Unsigned)
[facility] clinic  Provider:  Kenard Gower DNP  Code Status:  Full Code  Goals of Care:     05/27/2023   11:02 AM  Advanced Directives  Does Patient Have a Medical Advance Directive? No  Would patient like information on creating a medical advance directive? No - Patient declined     Chief Complaint  Patient presents with   Follow-up    4 week follow up.    Immunizations    Discuss the need for Influenza vaccine, Covid Booster, Pne vaccine, and Shingrix vaccine.    Discussed the use of AI scribe software for clinical note transcription with the patient, who gave verbal consent to proceed.   HPI: Patient is a 78 y.o. female seen today for a 4 week follow up.   The patient was accompanied today by her husband.  She was referred by a dermatologist for evaluation of granuloma annulare.  Granuloma annulare was initially diagnosed by a dermatologist and is spreading across her body. The rash began on the right lower leg and has since spread to the left thigh, bilateral upper extremities, and other areas. She was prescribed triamcinolone cream to apply twice daily for two weeks, then skip a week, but it has not been effective after over a month of use. A biopsy confirmed it is granuloma annulare.  She experiences significant insomnia, unable to fall asleep until 4 AM and waking around 7:30 or 8 AM. She has tried melatonin 5 mg and hydroxyzine without success. Previously, she took mirtazapine but discontinued it due to side effects. She is scheduled to see a psychiatrist for further evaluation of her sleep issues.  She has a history of chronic kidney disease stage 3B and takes Coumadin for her aortic valve replacement. She experiences shortness of breath and is scheduled to see a cardiologist soon. Her diabetes was previously managed with metformin, which was discontinued due to kidney function concerns, and she does not currently take any diabetes medication.  She experiences  heartburn and bloating with most foods and takes pantoprazole 40 mg, sometimes twice daily, to manage these symptoms. She also reports neuropathy in her legs but does not take gabapentin due to concerns about side effects.  She has a history of anxiety and depression, for which she takes sertraline 50 mg daily. Her anxiety is not well-controlled, and she experiences significant bloating and heartburn with most foods.   Past Medical History:  Diagnosis Date   Anemia    Anxiety    Aortic stenosis    a. Now has St Jude mechanical aortic valve (6/00). b. Echo (6/15) with EF 60-65%, mechanical aortic valve with mean gradient 27 mmHg.   Arthritis    a. Possible c-spine arthritis with pain down left arm.     Carotid artery disease (HCC)    a. Carotid dopplers (7/16) with 40-59% BICA stenosis.     Chronic angle-closure glaucoma(365.23)    Chronic diastolic CHF (congestive heart failure) (HCC)    a.  RHC (7/15) with mean RA 2, PA 22/11, mean PCWP 9, CI 3.79.   Contrast media allergy    Coronary atherosclerosis of native coronary artery    a. CABG at time of AVR in 6/00 with RIMA-RCA. b. Abnl nuc 11/2013 -> LHC (7/15) with patent RIMA-RCA, 80% mRCA, 80% pLAD with FFR 0.73, treated with DES to pLAD.   Depression    Essential hypertension    GERD (gastroesophageal reflux disease)    Hyperlipidemia    Low back pain  Neuromuscular disorder (HCC)    neuropathy   Peripheral vascular disease (HCC)    a, H/o Right SFA stent. b. Peripheral arterial dopplers (7/16) with right SFA stent patent.     PONV (postoperative nausea and vomiting)    N&V   Type II diabetes mellitus (HCC)     Past Surgical History:  Procedure Laterality Date   BILATERAL OOPHORECTOMY  05/24/1985   BREAST BIOPSY  05/24/2010   left   CARDIAC CATHETERIZATION  01/14/1999   normal LV function, severe aortic stenosis; 80% and 70% stenosis in RCA; mild 20% distal norrowing in L main with 20% proximal LAD stenosis, 40% diagonal  stenosis and 20% proximal circumflex stenosis   CARDIAC VALVE REPLACEMENT  05/24/1998   aortic valve    CARDIOVASCULAR STRESS TEST  08/25/2011   R/L MV - EF 72%; no scintigraphic evidence of inducible MI; normal perfusionTID of 1.25 elevated - could indicate small vessle subendocardial ischemia; EKG NSR at 66, non diagnostic for ischemia   CARPAL TUNNEL RELEASE  05/25/1987   bilaterally   CATARACT EXTRACTION W/ INTRAOCULAR LENS  IMPLANT, BILATERAL  ~ 2007   CESAREAN SECTION  1969; 1971   CHOLECYSTECTOMY  05/24/1988   COLONOSCOPY N/A 10/27/2012   Procedure: COLONOSCOPY;  Surgeon: Theda Belfast, MD;  Location: [facility];  Service: Endoscopy;  Laterality: N/A;   CORONARY ARTERY BYPASS GRAFT  05/24/1998   RIMA to RCA   CORONARY/GRAFT ANGIOGRAPHY N/A 09/07/2021   Procedure: CORONARY/GRAFT ANGIOGRAPHY;  Surgeon: Orbie Pyo, MD;  Location: [facility] INVASIVE CV LAB;  Service: Cardiovascular;  Laterality: N/A;   DOPPLER ECHOCARDIOGRAPHY  03/29/2012   EF >55%; mild concentric LVH; stage 1 diastolic dysfunction, elevated LV filling pressure, dilated LA; MAC mild MR; St Jude AVR peak and mean gradients of and ; transvalvular gradients have increased (prev 23 and 14 respectively)   ESOPHAGOGASTRODUODENOSCOPY N/A 10/27/2012   Procedure: ESOPHAGOGASTRODUODENOSCOPY (EGD);  Surgeon: Theda Belfast, MD;  Location: [facility];  Service: Endoscopy;  Laterality: N/A;   FEMORAL ARTERY STENT  09/20/2011   6 x 40 Smart Nitinol self-expanding stent placed;  10/15/2031 -R SFA stent open and patent w/o evidence of restenosis   FRACTIONAL FLOW RESERVE WIRE  12/14/2013   Procedure: FRACTIONAL FLOW RESERVE WIRE;  Surgeon: Laurey Morale, MD;  Location: [facility] C[facility] LAB;  Service: Cardiovascular;;   KNEE SURGERY Left    LACERATION REPAIR     right hand   LEFT AND RIGHT HEART CATHETERIZATION WITH CORONARY ANGIOGRAM N/A 12/14/2013   Procedure: LEFT AND RIGHT HEART CATHETERIZATION WITH CORONARY ANGIOGRAM;   Surgeon: Laurey Morale, MD;  Location: [facility] CATH LAB;  Service: Cardiovascular;  Laterality: N/A;   LOWER EXTREMITY ANGIOGRAM N/A 09/20/2011   Procedure: LOWER EXTREMITY ANGIOGRAM;  Surgeon: Runell Gess, MD;  Location: [facility] CATH LAB;  Service: Cardiovascular;  Laterality: N/A;   LYMPH NODE BIOPSY  06/25/2011   "core needle on 5"   needle biopsy  05/25/2007   "on ankles for nerve damage"   NEUROPLASTY / TRANSPOSITION ULNAR NERVE AT ELBOW     right   ORIF FEMUR FRACTURE Left 09/09/2021   Procedure: OPEN REDUCTION INTERNAL FIXATION (ORIF) DISTAL FEMUR FRACTURE;  Surgeon: Roby Lofts, MD;  Location: [facility] OR;  Service: Orthopedics;  Laterality: Left;   PERCUTANEOUS CORONARY STENT INTERVENTION (PCI-S)  12/14/2013   Procedure: PERCUTANEOUS CORONARY STENT INTERVENTION (PCI-S);  Surgeon: Laurey Morale, MD;  Location: [facility] C[facility] LAB;  Service: Cardiovascular;;  Prox LAD 3.00x12 Promus DES  PERIPHERAL ARTERIAL STENT GRAFT  09/20/2011   right SFA   REFRACTIVE SURGERY  ` 2004   "for glaucoma"   TONSILLECTOMY  05/24/1966   TRIGGER FINGER RELEASE Left 12/04/2015   Procedure: LEFT RING FINGER TRIGGER RELEASE;  Surgeon: Betha Loa, MD;  Location: [facility];  Service: Orthopedics;  Laterality: Left;   VAGINAL HYSTERECTOMY  05/25/1983   Fibroids    Allergies  Allergen Reactions   Atorvastatin     Muscle pain   Dapagliflozin     Other reaction(s): nausea   Gabapentin     Other reaction(s): stomach issues   Simvastatin Other (See Comments)    Muscle pain   Sulfa Antibiotics     unknown   Iodinated Contrast Media Rash     Red rash after cardiac cath 1 wk ago, ? Contrast allergy, requires 13 hr prep now per [email or web address]      Outpatient Encounter Medications as of 06/24/2023  Medication Sig   Ascorbic Acid (VITAMIN C) 1000 MG tablet Take 1,000 mg by mouth every evening.   aspirin EC 81 MG tablet Take 81 mg by mouth every evening.   cyanocobalamin (VITAMIN B12)  1000 MCG tablet Take 1,000 mcg by mouth daily.   fluticasone (FLONASE) 50 MCG/ACT nasal spray Place 2 sprays into both nostrils as needed for allergies or rhinitis.   folic acid (FOLVITE) 800 MCG tablet Take 800 mcg by mouth daily.   hydrOXYzine (VISTARIL) 25 MG capsule Take 1 capsule (25 mg total) by mouth at bedtime as needed.   latanoprost (XALATAN) 0.005 % ophthalmic solution PLACE 1 DROP INTO BOTH EYES AT BEDTIME.   melatonin 5 MG TABS Take 1 tablet (5 mg total) by mouth at bedtime.   metoprolol succinate (TOPROL XL) 50 MG 24 hr tablet Take 1 tablet (50 mg total) by mouth daily. Take with or immediately following a meal.   Multiple Vitamin (MULTIVITAMIN WITH MINERALS) TABS tablet Take 2 tablets by mouth daily.   nitroGLYCERIN (NITROSTAT) 0.4 MG SL tablet Place 1 tablet (0.4 mg total) under the tongue every 5 (five) minutes as needed for chest pain.   potassium chloride SA (KLOR-CON M20) 20 MEQ tablet TAKE 1 TABLET BY MOUTH TWICE A DAY   rosuvastatin (CRESTOR) 10 MG tablet Take 10 mg by mouth every evening.   sertraline (ZOLOFT) 50 MG tablet Take 1 tablet (50 mg total) by mouth daily.   torsemide (DEMADEX) 20 MG tablet Take 1 tablet (20 mg total) by mouth daily.   valsartan (DIOVAN) 80 MG tablet 1 tablet Orally Once a day   warfarin (COUMADIN) 3 MG tablet TAKE 1/2 TO 1 TABLET BY MOUTH DAILY AS DIRECTED (Patient taking differently: Take 1.5-3 mg by mouth as directed. TAKE 1 TABLET (3 MG) ON Mon,Wed,Fri,Sun & TAKE 1/2 TABLET (1.5 MG) on Tues,Thurs,Sat)   [DISCONTINUED] Cholecalciferol (VITAMIN D3 PO) Take 1,000 Units by mouth daily.   [DISCONTINUED] fluticasone (FLONASE) 50 MCG/ACT nasal spray Place 2 sprays into both nostrils daily. (Patient taking differently: Place 2 sprays into both nostrils daily as needed for allergies.)   No facility-administered encounter medications on file as of 06/24/2023.    Review of Systems:  Review of Systems  Constitutional:  Negative for appetite change,  chills, fatigue and fever.  HENT:  Negative for congestion, hearing loss, rhinorrhea and sore throat.   Eyes: Negative.   Respiratory:  Negative for cough, shortness of breath and wheezing.   Cardiovascular:  Negative for chest pain, palpitations and leg swelling.  Gastrointestinal:  Negative for abdominal pain, constipation, diarrhea, nausea and vomiting.  Genitourinary:  Negative for dysuria.  Musculoskeletal:  Negative for arthralgias, back pain and myalgias.  Skin:  Positive for rash. Negative for color change and wound.  Neurological:  Negative for dizziness, weakness and headaches.  Psychiatric/Behavioral:  Positive for sleep disturbance. Negative for behavioral problems. The patient is not nervous/anxious.     Health Maintenance  Topic Date Due   Pneumonia Vaccine 58+ Years old (1 of 2 - PCV) Never done   Zoster Vaccines- Shingrix (1 of 2) Never done   INFLUENZA VACCINE  12/23/2022   COVID-19 Vaccine (4 - 2024-25 season) 01/23/2023   HEMOGLOBIN A1C  11/23/2023   Diabetic kidney evaluation - Urine ACR  02/16/2024   FOOT EXAM  02/16/2024   OPHTHALMOLOGY EXAM  03/17/2024   Diabetic kidney evaluation - eGFR measurement  03/30/2024   Medicare Annual Wellness (AWV)  05/26/2024   DTaP/Tdap/Td (4 - Td or Tdap) 03/16/2026   DEXA SCAN  Completed   Hepatitis C Screening  Completed   HPV VACCINES  Aged Out   Colonoscopy  Discontinued    Physical Exam: Vitals:   06/24/23 1108  BP: 126/60  Pulse: 67  Resp: 17  Temp: (!) 96.4 F (35.8 C)  SpO2: 94%  Weight: 182 lb 6.4 oz (82.7 kg)  Height: 5\' 2"  (1.575 m)   Body mass index is 33.36 kg/m. Physical Exam Constitutional:      General: She is not in acute distress.    Appearance: She is obese.  HENT:     Head: Normocephalic and atraumatic.     Nose: Nose normal.     Mouth/Throat:     Mouth: Mucous membranes are moist.  Eyes:     Conjunctiva/sclera: Conjunctivae normal.  Cardiovascular:     Rate and Rhythm: Normal rate and  regular rhythm.  Pulmonary:     Effort: Pulmonary effort is normal.     Breath sounds: Normal breath sounds.  Abdominal:     General: Bowel sounds are normal.     Palpations: Abdomen is soft.  Musculoskeletal:        General: Normal range of motion.     Cervical back: Normal range of motion.  Skin:    General: Skin is warm and dry.     Findings: Rash present.     Comments: Erythematous rashes on bilateral upper extremities, left thigh, and right   Neurological:     General: No focal deficit present.     Mental Status: She is alert and oriented to person, place, and time.  Psychiatric:        Mood and Affect: Mood normal.        Behavior: Behavior normal.        Thought Content: Thought content normal.        Judgment: Judgment normal.     Labs reviewed: Basic Metabolic Panel: Recent Labs    02/16/23 0936 03/30/23 1805 03/31/23 0441  NA 140 139 138  K 4.5 3.8 3.9  CL 105 105 105  CO2 25 25 22   GLUCOSE 242* 213* 165*  BUN 35* 32* 29*  CREATININE 1.59* 1.36* 1.30*  CALCIUM 9.8 9.0 8.7*   Liver Function Tests: Recent Labs    01/10/23 0000 03/30/23 1805  AST 20 38  ALT 11 27  ALKPHOS 80 64  BILITOT  --  0.9  PROT  --  7.2  ALBUMIN 4.6 4.0   Recent Labs  03/30/23 1805 03/31/23 0441  LIPASE 54* 42   No results for input(s): "AMMONIA" in the last 8760 hours. CBC: Recent Labs    12/10/22 1421 01/10/23 0000 02/16/23 0936 03/30/23 1805 03/31/23 0441 04/01/23 0346  WBC 5.1 6.2 4.8 8.5 6.9 5.1  NEUTROABS 2,999 3.50 2,818  --   --   --   HGB 8.8* 10.5* 9.8* 10.6* 9.6* 8.9*  HCT 27.6* 32* 31.6* 33.2* 30.4* 28.9*  MCV 88.2  --  88.8 89.7 90.5 87.3  PLT 121* 128* 113* 131* 103* 84*   Lipid Panel: Recent Labs    02/16/23 0936 05/26/23 1041  CHOL 167 145  HDL 46* 41*  LDLCALC 83 71  TRIG 286* 253*  CHOLHDL 3.6 3.5   Lab Results  Component Value Date   HGBA1C 7.1 (H) 05/26/2023    Procedures since last visit: No results  found.  Assessment/Plan  1. Primary insomnia (Primary) -  Poor sleep despite hydroxyzine and melatonin. -Prescribe Ativan 0.5mg  at bedtime as needed for sleep. -Continue current regimen of hydroxyzine and melatonin. - Basic Metabolic Panel with eGFR - LORazepam (ATIVAN) 0.5 MG tablet; Take 1 tablet (0.5 mg total) by mouth at bedtime as needed for anxiety.  Dispense: 30 tablet; Refill: 0  2. Type II diabetes mellitus with peripheral autonomic neuropathy (HCC) Lab Results  Component Value Date   HGBA1C 7.1 (H) 05/26/2023    -  A1C increased to 7.1 from 6.5. -Advise diet and weight loss. -Repeat A1C in 3 months. - Microalbumin/Creatinine Ratio, Urine  3. Chronic diastolic heart failure (HCC) -  Stable on current regimen of Demadex. -Continue Demadex as prescribed.  4. Granuloma annulare -  Extensive involvement of all extremities. Poor response to triamcinolone cream. -Continue triamcinolone cream as prescribed. -  follow up with dermatology  5. H/O aortic valve replacement -  continue Coumadin - CBC with Differential/Platelets  6. Hypertension with chronic kidney disease -  Well-controlled with valsartan. -Continue valsartan as prescribed. -  check BMP  7. Depression with anxiety -  On sertraline. Reports feeling better with depression but anxiety is not well-controlled. -Continue sertraline 50mg  daily.  8. Gastroesophageal reflux disease without esophagitis -  Symptoms of heartburn and bloating despite pantoprazole. -Continue pantoprazole 40mg  daily. -Advise to avoid foods that trigger heartburn. - pantoprazole (PROTONIX) 40 MG tablet; Take 1 tablet (40 mg total) by mouth daily.   Labs/tests ordered:   BMP, CBC and urine microalbumin creatinine ratio  Next appt:  Visit date not found

## 2023-06-25 LAB — BASIC METABOLIC PANEL WITH GFR
BUN/Creatinine Ratio: 23 (calc) — ABNORMAL HIGH (ref 6–22)
BUN: 35 mg/dL — ABNORMAL HIGH (ref 7–25)
CO2: 26 mmol/L (ref 20–32)
Calcium: 9.4 mg/dL (ref 8.6–10.4)
Chloride: 101 mmol/L (ref 98–110)
Creat: 1.49 mg/dL — ABNORMAL HIGH (ref 0.60–1.00)
Glucose, Bld: 241 mg/dL — ABNORMAL HIGH (ref 65–99)
Potassium: 3.9 mmol/L (ref 3.5–5.3)
Sodium: 138 mmol/L (ref 135–146)
eGFR: 36 mL/min/{1.73_m2} — ABNORMAL LOW (ref >=60)

## 2023-06-25 LAB — MICROALBUMIN / CREATININE URINE RATIO
Creatinine, Urine: 99 mg/dL (ref 20–275)
Microalb Creat Ratio: 31 mg/g{creat} — ABNORMAL HIGH (ref <30)
Microalb, Ur: 3.1 mg/dL

## 2023-06-27 ENCOUNTER — Encounter: Payer: Self-pay | Admitting: Professional Counselor

## 2023-06-27 ENCOUNTER — Ambulatory Visit: Payer: Medicare Other | Admitting: Professional Counselor

## 2023-06-27 DIAGNOSIS — F411 Generalized anxiety disorder: Secondary | ICD-10-CM

## 2023-06-27 DIAGNOSIS — F330 Major depressive disorder, recurrent, mild: Secondary | ICD-10-CM | POA: Diagnosis not present
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
## 2023-06-28 ENCOUNTER — Encounter: Payer: Self-pay | Admitting: Student

## 2023-06-28 ENCOUNTER — Ambulatory Visit: Payer: Medicare Other | Attending: Student | Admitting: Student

## 2023-06-28 ENCOUNTER — Encounter: Payer: Self-pay | Admitting: *Deleted

## 2023-06-28 ENCOUNTER — Ambulatory Visit (INDEPENDENT_AMBULATORY_CARE_PROVIDER_SITE_OTHER): Payer: Medicare Other

## 2023-06-28 VITALS — BP 144/60 | HR 70 | Ht 62.0 in | Wt 182.0 lb

## 2023-06-28 DIAGNOSIS — Z952 Presence of prosthetic heart valve: Secondary | ICD-10-CM | POA: Insufficient documentation

## 2023-06-28 DIAGNOSIS — I739 Peripheral vascular disease, unspecified: Secondary | ICD-10-CM | POA: Diagnosis not present

## 2023-06-28 DIAGNOSIS — E669 Obesity, unspecified: Secondary | ICD-10-CM | POA: Diagnosis not present

## 2023-06-28 DIAGNOSIS — E785 Hyperlipidemia, unspecified: Secondary | ICD-10-CM

## 2023-06-28 DIAGNOSIS — I5032 Chronic diastolic (congestive) heart failure: Secondary | ICD-10-CM | POA: Diagnosis not present

## 2023-06-28 DIAGNOSIS — E1169 Type 2 diabetes mellitus with other specified complication: Secondary | ICD-10-CM

## 2023-06-28 DIAGNOSIS — I6523 Occlusion and stenosis of bilateral carotid arteries: Secondary | ICD-10-CM

## 2023-06-28 DIAGNOSIS — I2510 Atherosclerotic heart disease of native coronary artery without angina pectoris: Secondary | ICD-10-CM

## 2023-06-28 DIAGNOSIS — I10 Essential (primary) hypertension: Secondary | ICD-10-CM

## 2023-06-28 DIAGNOSIS — I6529 Occlusion and stenosis of unspecified carotid artery: Secondary | ICD-10-CM | POA: Diagnosis not present

## 2023-06-28 DIAGNOSIS — I340 Nonrheumatic mitral (valve) insufficiency: Secondary | ICD-10-CM | POA: Diagnosis not present

## 2023-06-28 DIAGNOSIS — I350 Nonrheumatic aortic (valve) stenosis: Secondary | ICD-10-CM | POA: Diagnosis not present

## 2023-06-28 DIAGNOSIS — N1832 Chronic kidney disease, stage 3b: Secondary | ICD-10-CM

## 2023-06-28 DIAGNOSIS — R296 Repeated falls: Secondary | ICD-10-CM

## 2023-06-28 DIAGNOSIS — Z7901 Long term (current) use of anticoagulants: Secondary | ICD-10-CM | POA: Insufficient documentation

## 2023-06-28 DIAGNOSIS — R0602 Shortness of breath: Secondary | ICD-10-CM

## 2023-06-28 LAB — POCT INR: INR: 5.1 — AB (ref 2.0–3.0)

## 2023-06-28 MED ORDER — ROSUVASTATIN CALCIUM 20 MG PO TABS: 20.0000 mg | ORAL_TABLET | Freq: Every evening | ORAL | 6 refills | Status: DC

## 2023-06-28 NOTE — Progress Notes (Signed)
Patient enrolled for [employer]/ [employer] to ship a 30 day cardiac event monitor to her address on file. 

## 2023-06-28 NOTE — Progress Notes (Signed)
-     Kidney function stable, electrolytes normal -Urine microalbumin creatinine ratio elevated, continue valsartan -   Hemoglobin 10.2, up from 8.9 (04/01/2023) -  No new orders

## 2023-06-28 NOTE — Patient Instructions (Signed)
HOLD TODAY and WEDNESDAY THEN  continue taking warfarin 1 tablet daily except for 1/2 tablet on Tuesday, Thursday and Saturday. Repeat INR in 1 week. [facility] [phone]

## 2023-06-28 NOTE — Patient Instructions (Addendum)
 Medication Instructions:  INCREASE CRESTOR  20MG  DAILY *If you need a refill on your cardiac medications before your next appointment, please call your pharmacy*  Lab Work: BNP TODAY If you have labs (blood work) drawn today and your tests are completely normal, you will receive your results only by:  MyChart Message (if you have MyChart) OR A paper copy in the mail If you have any lab test that is abnormal or we need to change your treatment, we will call you to review the results.  Testing/Procedures: Your physician has requested that you have an echocardiogram. Echocardiography is a painless test that uses sound waves to create images of your heart. It provides your doctor with information about the size and shape of your heart and how well your heart's chambers and valves are working. This procedure takes approximately one hour. There are no restrictions for this procedure. Please do NOT wear cologne, perfume, aftershave, or lotions (deodorant is allowed). Please arrive 15 minutes prior to your appointment time.  Please note: We ask at that you not bring children with you during ultrasound (echo/ vascular) testing. Due to room size and safety concerns, children are not allowed in the ultrasound rooms during exams. Our front office staff cannot provide observation of children in our lobby area while testing is being conducted. An adult accompanying a patient to their appointment will only be allowed in the ultrasound room at the discretion of the ultrasound technician under special circumstances. We apologize for any inconvenience.   Your physician has requested that you have a carotid duplex. This test is an ultrasound of the carotid arteries in your neck. It looks at blood flow through these arteries that supply the brain with blood. Allow one hour for this exam. There are no restrictions or special instructions.   SEE BELOW FOR MONITOR INSTRUCTIONS  Other Instructions PLEASE READ AND FOLLOW  ATTACHED  SALTY 6 PLEASE WEIGH DAILY AND CALL IF YOU GAIN 3 POUNDS IN A DAY OR 5 POUNDS IN A WEEK  Follow-Up: At [facility], you and your health needs are our priority.  As part of our continuing mission to provide you with exceptional heart care, we have created designated Provider Care Teams.  These Care Teams include your primary Cardiologist (physician) and Advanced Practice Providers (APPs -  Physician Assistants and Nurse Practitioners) who all work together to provide you with the care you need, when you need it.  Your next appointment:   AFTER TESTING IN APRIL  Provider:    Callie Goodrich, PA-C         [employer] Cardiac Event Monitor Instructions  Your physician has requested you wear your cardiac event monitor for 30 days, (1-30). [employer] may call or text to confirm a shipping address. The monitor will be sent to a land address via UPS. [employer] will not ship a monitor to a PO BOX. It typically takes 3-5 days to receive your monitor after it has been enrolled. [employer] will assist with USPS tracking if your package is delayed. The telephone number for [employer] is [phone]. Once you have received your monitor, please review the enclosed instructions. Instruction tutorials can also be viewed under help and settings on the enclosed cell phone. Your monitor has already been registered assigning a specific monitor serial # to you.  Billing and Self Pay Discount Information  [employer] has been provided the insurance information we had on file for you.  If your insurance has been updated, please call [employer] at [phone] to  provide them with your updated insurance information.   [employer] offers a discounted Self Pay option for patients who have insurance that does not cover their cardiac event monitor or patients without insurance.  The discounted cost of a Self Pay Cardiac Event Monitor would be $225.00 , if the patient contacts [employer] at  [phone] within 7 days of applying the monitor to make payment arrangements.  If the patient does not contact [employer] within 7 days of applying the monitor, the cost of the cardiac event monitor will be $350.00.  Applying the monitor  Remove cell phone from case and turn it on. The cell phone works as it consultant and needs to be within [employer] of you at all times. The cell phone will need to be charged on a daily basis. We recommend you plug the cell phone into the enclosed charger at your bedside table every night.  Monitor batteries: You will receive two monitor batteries labelled #1 and #2. These are your recorders. Plug battery #2 onto the second connection on the enclosed charger. Keep one battery on the charger at all times. This will keep the monitor battery deactivated. It will also keep it fully charged for when you need to switch your monitor batteries. A small light will be blinking on the battery emblem when it is charging. The light on the battery emblem will remain on when the battery is fully charged.  Open package of a Monitor strip. Insert battery #1 into black hood on strip and gently squeeze monitor battery onto connection as indicated in instruction booklet. Set aside while preparing skin.  Choose location for your strip, vertical or horizontal, as indicated in the instruction booklet. Shave to remove all hair from location. There cannot be any lotions, oils, powders, or colognes on skin where monitor is to be applied. Wipe skin clean with enclosed Saline wipe. Dry skin completely.  Peel paper labeled #1 off the back of the Monitor strip exposing the adhesive. Place the monitor on the chest in the vertical or horizontal position shown in the instruction booklet. One arrow on the monitor strip must be pointing upward. Carefully remove paper labeled #2, attaching remainder of strip to your skin. Try not to create any folds or wrinkles in the strip as you apply  it.  Firmly press and release the circle in the center of the monitor battery. You will hear a small beep. This is turning the monitor battery on. The heart emblem on the monitor battery will light up every 5 seconds if the monitor battery in turned on and connected to the patient securely. Do not push and hold the circle down as this turns the monitor battery off. The cell phone will locate the monitor battery. A screen will appear on the cell phone checking the connection of your monitor strip. This may read poor connection initially but change to good connection within the next minute. Once your monitor accepts the connection you will hear a series of 3 beeps followed by a climbing crescendo of beeps. A screen will appear on the cell phone showing the two monitor strip placement options. Touch the picture that demonstrates where you applied the monitor strip.  Your monitor strip and battery are waterproof. You are able to shower, bathe, or swim with the monitor on. They just ask you do not submerge deeper than 3 feet underwater. We recommend removing the monitor if you are swimming in a lake, river, or ocean.  Your monitor battery will need  to be switched to a fully charged monitor battery approximately once a week. The cell phone will alert you of an action which needs to be made.  On the cell phone, tap for details to reveal connection status, monitor battery status, and cell phone battery status. The green dots indicates your monitor is in good status. A red dot indicates there is something that needs your attention.  To record a symptom, click the circle on the monitor battery. In 30-60 seconds a list of symptoms will appear on the cell phone. Select your symptom and tap save. Your monitor will record a sustained or significant arrhythmia regardless of you clicking the button. Some patients do not feel the heart rhythm irregularities. [employer] will notify us  of any serious or  critical events.  Refer to instruction booklet for instructions on switching batteries, changing strips, the Do not disturb or Pause features, or any additional questions.  Call [employer] at [phone], to confirm your monitor is transmitting and record your baseline. They will answer any questions you may have regarding the monitor instructions at that time.  Returning the monitor to [employer]  Place all equipment back into blue box. Peel off strip of paper to expose adhesive and close box securely. There is a prepaid UPS shipping label on this box. Drop in a UPS drop box, or at a UPS facility like [employer]. You may also contact [employer] to arrange UPS to pick up monitor package at your home.              Heart Failure Education: Weigh yourself EVERY morning after you go to the bathroom but before you eat or drink anything. Write this number down in a weight log/diary. If you gain 3 pounds overnight or 5 pounds in a week, call the office. Take your medicines as prescribed. If you have concerns about your medications, please call us  before you stop taking them.  Eat low salt foods--Limit salt (sodium) to 2000 mg per day. This will help prevent your body from holding onto fluid. Read food labels as many processed foods have a lot of sodium, especially canned goods and prepackaged meats. If you would like some assistance choosing low sodium foods, we would be happy to set you up with a nutritionist. Limit all fluids for the day to less than 2 liters (64 ounces). Fluid includes all drinks, coffee, juice, ice chips, soup, jello, and all other liquids. Stay as active as you can everyday. Staying active will give you more energy and make your muscles stronger. Start with 5 minutes at a time and work your way up to 30 minutes a day. Break up your activities--do some in the morning and some in the afternoon. Start with 3 days per week and work your way up to 5 days as you can.  If you have  chest pain, feel short of breath, dizzy, or lightheaded, STOP. If you don't feel better after a short rest, call 911. If you do feel better, call the office to let us  know you have symptoms with exercise.

## 2023-06-29 LAB — BRAIN NATRIURETIC PEPTIDE: BNP: 500.3 pg/mL — ABNORMAL HIGH (ref 0.0–100.0)

## 2023-07-04 ENCOUNTER — Other Ambulatory Visit: Payer: Self-pay

## 2023-07-04 DIAGNOSIS — I5032 Chronic diastolic (congestive) heart failure: Secondary | ICD-10-CM

## 2023-07-04 MED ORDER — TORSEMIDE 20 MG PO TABS: 20.0000 mg | ORAL_TABLET | Freq: Every day | ORAL | Status: AC

## 2023-07-04 MED ORDER — POTASSIUM CHLORIDE CRYS ER 20 MEQ PO TBCR: 20.0000 meq | EXTENDED_RELEASE_TABLET | Freq: Two times a day (BID) | ORAL | 1 refills | Status: DC

## 2023-07-05 ENCOUNTER — Encounter: Payer: Self-pay | Admitting: Sports Medicine

## 2023-07-05 ENCOUNTER — Telehealth: Payer: Self-pay | Admitting: Student

## 2023-07-05 ENCOUNTER — Telehealth: Payer: Self-pay

## 2023-07-05 ENCOUNTER — Ambulatory Visit (INDEPENDENT_AMBULATORY_CARE_PROVIDER_SITE_OTHER): Payer: Medicare Other | Admitting: Sports Medicine

## 2023-07-05 VITALS — BP 124/78 | HR 74 | Temp 97.1°F | Resp 18 | Ht 62.0 in | Wt 179.2 lb

## 2023-07-05 DIAGNOSIS — Z7901 Long term (current) use of anticoagulants: Secondary | ICD-10-CM | POA: Diagnosis not present

## 2023-07-05 DIAGNOSIS — R42 Dizziness and giddiness: Secondary | ICD-10-CM | POA: Diagnosis not present

## 2023-07-05 DIAGNOSIS — Z9181 History of falling: Secondary | ICD-10-CM

## 2023-07-05 NOTE — Telephone Encounter (Signed)
Patient identification verified by 2 forms. Jeb Levering, RN     Called and spoke to patient  Patient states: PCP gave Ativan to sleep at night, pt wants to know can her appts be moved up because her PCP wants her to know should have the CT of her head tomorrow at 10:00 am.   Pt informed the tests ordered by [facility] and her PCP are looking at different areas She verbalized understanding but still wanted Callie's opinion. Pt made aware it's end of day and we may not get an answer back before her appt tomorrow. She verbalized understanding.   Patient agrees with plan to send message to [facility], no further questions at this time.

## 2023-07-05 NOTE — Telephone Encounter (Signed)
Pt calling in to speak with [facility], PA or nurse about her appt today with her PCP.

## 2023-07-05 NOTE — Telephone Encounter (Signed)
Called pt, relied [employer]. Pt verbalized understanding. No further questions at this time.

## 2023-07-05 NOTE — Telephone Encounter (Signed)
Patient called to say that she walked in for a CT and there was 9 people ahead of her so she left and scheduled an appointment for tomorrow   FYI

## 2023-07-05 NOTE — Progress Notes (Signed)
Careteam: Patient Care Team: Medina-Vargas, Margit Banda, NP as PCP - General (Internal Medicine) O'Neal, Ronnald Ramp, MD as PCP - Cardiology (Cardiology) Lennette Bihari, MD (Cardiology) Sypher, Molly Maduro, MD (Inactive) (Orthopedic Surgery) Melvenia Needles, MD (Ophthalmology)  PLACE OF SERVICE:  [facility]  Advanced Directive information    Allergies  Allergen Reactions   Atorvastatin     Muscle pain   Dapagliflozin     Other reaction(s): nausea   Gabapentin     Other reaction(s): stomach issues   Simvastatin Other (See Comments)    Muscle pain   Sulfa Antibiotics     unknown   Iodinated Contrast Media Rash     Red rash after cardiac cath 1 wk ago, ? Contrast allergy, requires 13 hr prep now per [email or web address]      Chief Complaint  Patient presents with   Acute Visit    Dizziness related to medication     Discussed the use of AI scribe software for clinical note transcription with the patient, who gave verbal consent to proceed.  History of Present Illness   Abigail Oliver "[name]" is a 78 year old female with heart disease and glaucoma who presents with dizziness and blurred vision.  .  She has been experiencing dizziness and blurred vision for approximately five days. The dizziness occurs when she turns her head or stands up, while the blurred vision is primarily on the right side, described as 'double or so blurry I can't tell you what it is.' No dizziness when sitting still, but she feels shaky and 'unsettled' without nausea.  A week ago, she experienced a fall while getting dressed, feeling dizzy and losing balance, but did not hit her head hard or lose consciousness. Dizziness began after taking lorazepam, which was PCP prescribed by her  and has since been discontinued. She also stopped hydroxyzine and cetirizine due to dizziness and potential interactions with her existing conditions, including glaucoma, heart disease, diabetes, and kidney issues.  She  has a history of heart disease and is currently on Coumadin. Recent blood work showed a BNP over 500. She is scheduled for an echocardiogram and a carotid artery test due to known blockage. Shortness of breath has improved after a recent change in her water pill,   No chest pain, headaches, or nausea.   She uses a walker for stability, especially since feeling dizzy. No weakness on one side of her body, difficulty swallowing, or changes in urination or bowel movements.         Review of Systems:  Review of Systems  Constitutional:  Negative for chills and fever.  HENT:  Negative for congestion and sore throat.   Eyes:  Negative for double vision.  Respiratory:  Negative for cough, sputum production and shortness of breath (exertional, chronic, no recent change).   Cardiovascular:  Negative for chest pain, palpitations and leg swelling.  Gastrointestinal:  Negative for abdominal pain, heartburn and nausea.  Genitourinary:  Negative for dysuria, frequency and hematuria.  Musculoskeletal:  Positive for back pain and falls. Negative for myalgias.  Neurological:  Positive for dizziness. Negative for sensory change and focal weakness.   Negative unless indicated in HPI.   Past Medical History:  Diagnosis Date   Anemia    Anxiety    Aortic stenosis    a. Now has St Jude mechanical aortic valve (6/00). b. Echo (6/15) with EF 60-65%, mechanical aortic valve with mean gradient 27 mmHg.   Arthritis    a. Possible  c-spine arthritis with pain down left arm.     Carotid artery disease (HCC)    a. Carotid dopplers (7/16) with 40-59% BICA stenosis.     Chronic angle-closure glaucoma(365.23)    Chronic diastolic CHF (congestive heart failure) (HCC)    a.  RHC (7/15) with mean RA 2, PA 22/11, mean PCWP 9, CI 3.79.   Contrast media allergy    Coronary atherosclerosis of native coronary artery    a. CABG at time of AVR in 6/00 with RIMA-RCA. b. Abnl nuc 11/2013 -> LHC (7/15) with patent RIMA-RCA, 80%  mRCA, 80% pLAD with FFR 0.73, treated with DES to pLAD.   Depression    Essential hypertension    GERD (gastroesophageal reflux disease)    Hyperlipidemia    Low back pain    Neuromuscular disorder (HCC)    neuropathy   Peripheral vascular disease (HCC)    a, H/o Right SFA stent. b. Peripheral arterial dopplers (7/16) with right SFA stent patent.     PONV (postoperative nausea and vomiting)    N&V   Type II diabetes mellitus (HCC)    Past Surgical History:  Procedure Laterality Date   BILATERAL OOPHORECTOMY  05/24/1985   BREAST BIOPSY  05/24/2010   left   CARDIAC CATHETERIZATION  01/14/1999   normal LV function, severe aortic stenosis; 80% and 70% stenosis in RCA; mild 20% distal norrowing in L main with 20% proximal LAD stenosis, 40% diagonal stenosis and 20% proximal circumflex stenosis   CARDIAC VALVE REPLACEMENT  05/24/1998   aortic valve    CARDIOVASCULAR STRESS TEST  08/25/2011   R/L MV - EF 72%; no scintigraphic evidence of inducible MI; normal perfusionTID of 1.25 elevated - could indicate small vessle subendocardial ischemia; EKG NSR at 66, non diagnostic for ischemia   CARPAL TUNNEL RELEASE  05/25/1987   bilaterally   CATARACT EXTRACTION W/ INTRAOCULAR LENS  IMPLANT, BILATERAL  ~ 2007   CESAREAN SECTION  1969; 1971   CHOLECYSTECTOMY  05/24/1988   COLONOSCOPY N/A 10/27/2012   Procedure: COLONOSCOPY;  Surgeon: Theda Belfast, MD;  Location: [facility];  Service: Endoscopy;  Laterality: N/A;   CORONARY ARTERY BYPASS GRAFT  05/24/1998   RIMA to RCA   CORONARY/GRAFT ANGIOGRAPHY N/A 09/07/2021   Procedure: CORONARY/GRAFT ANGIOGRAPHY;  Surgeon: Orbie Pyo, MD;  Location: [facility];  Service: Cardiovascular;  Laterality: N/A;   DOPPLER ECHOCARDIOGRAPHY  03/29/2012   EF >55%; mild concentric LVH; stage 1 diastolic dysfunction, elevated LV filling pressure, dilated LA; MAC mild MR; St Jude AVR peak and mean gradients of and ; transvalvular gradients  have increased (prev 23 and 14 respectively)   ESOPHAGOGASTRODUODENOSCOPY N/A 10/27/2012   Procedure: ESOPHAGOGASTRODUODENOSCOPY (EGD);  Surgeon: Theda Belfast, MD;  Location: [facility];  Service: Endoscopy;  Laterality: N/A;   FEMORAL ARTERY STENT  09/20/2011   6 x 40 Smart Nitinol self-expanding stent placed;  10/15/2031 -R SFA stent open and patent w/o evidence of restenosis   FRACTIONAL FLOW RESERVE WIRE  12/14/2013   Procedure: FRACTIONAL FLOW RESERVE WIRE;  Surgeon: Laurey Morale, MD;  Location: [facility] CATH LAB;  Service: Cardiovascular;;   KNEE SURGERY Left    LACERATION REPAIR     right hand   LEFT AND RIGHT HEART CATHETERIZATION WITH CORONARY ANGIOGRAM N/A 12/14/2013   Procedure: LEFT AND RIGHT HEART CATHETERIZATION WITH CORONARY ANGIOGRAM;  Surgeon: Laurey Morale, MD;  Location: [facility] CATH LAB;  Service: Cardiovascular;  Laterality: N/A;   LOWER EXTREMITY  ANGIOGRAM N/A 09/20/2011   Procedure: LOWER EXTREMITY ANGIOGRAM;  Surgeon: Runell Gess, MD;  Location: [facility] CATH LAB;  Service: Cardiovascular;  Laterality: N/A;   LYMPH NODE BIOPSY  06/25/2011   "core needle on 5"   needle biopsy  05/25/2007   "on ankles for nerve damage"   NEUROPLASTY / TRANSPOSITION ULNAR NERVE AT ELBOW     right   ORIF FEMUR FRACTURE Left 09/09/2021   Procedure: OPEN REDUCTION INTERNAL FIXATION (ORIF) DISTAL FEMUR FRACTURE;  Surgeon: Roby Lofts, MD;  Location: [facility] OR;  Service: Orthopedics;  Laterality: Left;   PERCUTANEOUS CORONARY STENT INTERVENTION (PCI-S)  12/14/2013   Procedure: PERCUTANEOUS CORONARY STENT INTERVENTION (PCI-S);  Surgeon: Laurey Morale, MD;  Location: [facility] CATH LAB;  Service: Cardiovascular;;  Prox LAD 3.00x12 Promus DES    PERIPHERAL ARTERIAL STENT GRAFT  09/20/2011   right SFA   REFRACTIVE SURGERY  ` 2004   "for glaucoma"   TONSILLECTOMY  05/24/1966   TRIGGER FINGER RELEASE Left 12/04/2015   Procedure: LEFT RING FINGER TRIGGER RELEASE;  Surgeon: Betha Loa, MD;  Location:  [facility];  Service: Orthopedics;  Laterality: Left;   VAGINAL HYSTERECTOMY  05/25/1983   Fibroids   Social History:   reports that she has never smoked. She has never used smokeless tobacco. She reports that she does not currently use alcohol. She reports that she does not currently use drugs.  Family History  Problem Relation Age of Onset   Colon cancer Mother    COPD Mother    Emphysema Mother    Cancer Maternal Grandmother    Cancer Maternal Grandfather    Heart disease Brother    Diabetes Neg Hx     Medications: Patient's Medications  New Prescriptions   No medications on file  Previous Medications   ASCORBIC ACID (VITAMIN C) 1000 MG TABLET    Take 1,000 mg by mouth every evening.   ASPIRIN EC 81 MG TABLET    Take 81 mg by mouth every evening.   CYANOCOBALAMIN (VITAMIN B12) 1000 MCG TABLET    Take 1,000 mcg by mouth daily.   FLUTICASONE (FLONASE) 50 MCG/ACT NASAL SPRAY    Place 2 sprays into both nostrils as needed for allergies or rhinitis.   FOLIC ACID (FOLVITE) 800 MCG TABLET    Take 800 mcg by mouth daily.   HYDROXYZINE (VISTARIL) 25 MG CAPSULE    Take 1 capsule (25 mg total) by mouth at bedtime as needed.   LATANOPROST (XALATAN) 0.005 % OPHTHALMIC SOLUTION    PLACE 1 DROP INTO BOTH EYES AT BEDTIME.   MELATONIN 5 MG TABS    Take 1 tablet (5 mg total) by mouth at bedtime.   METOPROLOL SUCCINATE (TOPROL XL) 50 MG 24 HR TABLET    Take 1 tablet (50 mg total) by mouth daily. Take with or immediately following a meal.   MULTIPLE VITAMIN (MULTIVITAMIN WITH MINERALS) TABS TABLET    Take 2 tablets by mouth daily.   NITROGLYCERIN (NITROSTAT) 0.4 MG SL TABLET    Place 1 tablet (0.4 mg total) under the tongue every 5 (five) minutes as needed for chest pain.   PANTOPRAZOLE (PROTONIX) 40 MG TABLET    Take 1 tablet (40 mg total) by mouth daily.   POTASSIUM CHLORIDE SA (KLOR-CON M20) 20 MEQ TABLET    Take 1 tablet (20 mEq total) by mouth 2 (two) times daily. increase her  potassium chloride to 40 mEq twice daily for 3 days (while on  higher dose of Torsemide) then back down to 20 mEq twice daily.   ROSUVASTATIN (CRESTOR) 20 MG TABLET    Take 1 tablet (20 mg total) by mouth every evening.   SERTRALINE (ZOLOFT) 50 MG TABLET    Take 1 tablet (50 mg total) by mouth daily.   TORSEMIDE (DEMADEX) 20 MG TABLET    Take 1 tablet (20 mg total) by mouth daily. increase her Torsemide to 20mg  twice daily for 3 days THEN BACK TO ONCE DAILY   VALSARTAN (DIOVAN) 80 MG TABLET    1 tablet Orally Once a day   WARFARIN (COUMADIN) 3 MG TABLET    TAKE 1/2 TO 1 TABLET BY MOUTH DAILY AS DIRECTED  Modified Medications   No medications on file  Discontinued Medications   LORAZEPAM (ATIVAN) 0.5 MG TABLET    Take 1 tablet (0.5 mg total) by mouth at bedtime as needed for anxiety.    Physical Exam: Vitals:   07/05/23 1441 07/05/23 1512  BP: 124/78   Pulse: 74   Resp: 18   Temp: (!) 97.1 F (36.2 C)   SpO2: 94% 97%  Weight: 179 lb 3.2 oz (81.3 kg)   Height: 5\' 2"  (1.575 m)    Body mass index is 32.78 kg/m. BP Readings from Last 3 Encounters:  07/05/23 124/78  06/28/23 (!) 144/60  06/24/23 126/60   Wt Readings from Last 3 Encounters:  07/05/23 179 lb 3.2 oz (81.3 kg)  06/28/23 182 lb (82.6 kg)  06/24/23 182 lb 6.4 oz (82.7 kg)    Physical Exam Constitutional:      Appearance: Normal appearance.  HENT:     Head: Normocephalic and atraumatic.  Cardiovascular:     Rate and Rhythm: Normal rate and regular rhythm.  Pulmonary:     Effort: Pulmonary effort is normal. No respiratory distress.     Breath sounds: Normal breath sounds. No wheezing.  Abdominal:     General: Bowel sounds are normal. There is no distension.     Tenderness: There is no abdominal tenderness. There is no guarding or rebound.     Comments:    Musculoskeletal:        General: No swelling or tenderness.  Skin:    General: Skin is dry.  Neurological:     Mental Status: She is alert. Mental status is  at baseline.     Sensory: No sensory deficit.     Motor: No weakness.     Comments: Rombergs positive Finger nose negative Heel to shin neg Strength and sensations intact      Labs reviewed: Basic Metabolic Panel: Recent Labs    03/30/23 1805 03/31/23 0441 06/24/23 1331  NA 139 138 138  K 3.8 3.9 3.9  CL 105 105 101  CO2 25 22 26   GLUCOSE 213* 165* 241*  BUN 32* 29* 35*  CREATININE 1.36* 1.30* 1.49*  CALCIUM 9.0 8.7* 9.4   Liver Function Tests: Recent Labs    01/10/23 0000 03/30/23 1805  AST 20 38  ALT 11 27  ALKPHOS 80 64  BILITOT  --  0.9  PROT  --  7.2  ALBUMIN 4.6 4.0   Recent Labs    03/30/23 1805 03/31/23 0441  LIPASE 54* 42   No results for input(s): "AMMONIA" in the last 8760 hours. CBC: Recent Labs    01/10/23 0000 02/16/23 0936 03/30/23 1805 03/31/23 0441 04/01/23 0346 06/24/23 1201  WBC 6.2 4.8   < > 6.9 5.1 5.3  NEUTROABS 3.50 2,818  --   --   --  3,286  HGB 10.5* 9.8*   < > 9.6* 8.9* 10.2*  HCT 32* 31.6*   < > 30.4* 28.9* 32.3*  MCV  --  88.8   < > 90.5 87.3 87.8  PLT 128* 113*   < > 103* 84* 114*   < > = values in this interval not displayed.   Lipid Panel: Recent Labs    02/16/23 0936 05/26/23 1041  CHOL 167 145  HDL 46* 41*  LDLCALC 83 71  TRIG 286* 253*  CHOLHDL 3.6 3.5   TSH: No results for input(s): "TSH" in the last 8760 hours. A1C: Lab Results  Component Value Date   HGBA1C 7.1 (H) 05/26/2023    Assessment and Plan 1. Dizziness (Primary)  - Basic Metabolic Panel with eGFR - CT HEAD WO CONTRAST ( ); Future 2. History of recent fall  - CT HEAD WO CONTRAST ( ); Future   Current use of long term anticoagulation  - CT HEAD WO CONTRAST ( ); Future       Patient experienced a fall one week ago without loss of consciousness or head trauma. Dizziness began after starting Lorazepam, which has since been discontinued. Dizziness is exacerbated by head movement and standing, and is associated with blurry  vision and feeling of shakiness. No associated headache, nausea, or weakness. Rombergs positive Ortho stasis positive -Order CT scan of the head to rule out intracranial bleeding due to patient's use of Coumadin. -Advise patient to use a walker for increased stability.  Elevated BNP BNP over 500, indicating possible heart failure exacerbation. Patient reports shortness of breath which improved with diuretic adjustment and weight loss. -Schedule echocardiogram and carotid artery test as planned by cardiologist.  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 -Continue follow-up with cardiology for heart disease management.          Return follow up with PCP, for give information for [facility] imaging , she needs to go for CT scan .:   Abigail Oliver

## 2023-07-05 NOTE — Telephone Encounter (Signed)
Patient was given lorazepam for sleeping and it made her really dizzy. Patient called here and was told to d/c medication by on-call provider  Patient with ongoing dizziness and blurry vision in right eye. Patient sates when she tries stands up it makes her more dizzy. Patient to be seen today by Dr.Veludandi

## 2023-07-05 NOTE — Telephone Encounter (Signed)
I reviewed PCP's note. Agree with head CT given new vision changes as well as dizziness.   Thank you!

## 2023-07-06 ENCOUNTER — Ambulatory Visit
Admission: RE | Admit: 2023-07-06 | Discharge: 2023-07-06 | Disposition: A | Discharge status: Home / Self Care | Payer: Medicare Other | Source: Ambulatory Visit | Attending: Sports Medicine | Admitting: Sports Medicine

## 2023-07-06 DIAGNOSIS — R42 Dizziness and giddiness: Secondary | ICD-10-CM

## 2023-07-06 DIAGNOSIS — Z7901 Long term (current) use of anticoagulants: Secondary | ICD-10-CM

## 2023-07-06 DIAGNOSIS — R55 Syncope and collapse: Secondary | ICD-10-CM | POA: Diagnosis not present

## 2023-07-06 DIAGNOSIS — H538 Other visual disturbances: Secondary | ICD-10-CM | POA: Diagnosis not present

## 2023-07-06 DIAGNOSIS — Z9181 History of falling: Secondary | ICD-10-CM

## 2023-07-06 LAB — BASIC METABOLIC PANEL WITH GFR
BUN/Creatinine Ratio: 32 (calc) — ABNORMAL HIGH (ref 6–22)
BUN: 66 mg/dL — ABNORMAL HIGH (ref 7–25)
CO2: 28 mmol/L (ref 20–32)
Calcium: 10.1 mg/dL (ref 8.6–10.4)
Chloride: 98 mmol/L (ref 98–110)
Creat: 2.08 mg/dL — ABNORMAL HIGH (ref 0.60–1.00)
Glucose, Bld: 173 mg/dL — ABNORMAL HIGH (ref 65–99)
Potassium: 4.5 mmol/L (ref 3.5–5.3)
Sodium: 137 mmol/L (ref 135–146)
eGFR: 24 mL/min/{1.73_m2} — ABNORMAL LOW (ref >=60)

## 2023-07-07 DIAGNOSIS — R296 Repeated falls: Secondary | ICD-10-CM

## 2023-07-07 DIAGNOSIS — R42 Dizziness and giddiness: Secondary | ICD-10-CM | POA: Diagnosis not present

## 2023-07-08 ENCOUNTER — Encounter: Payer: Self-pay | Admitting: Professional Counselor

## 2023-07-08 ENCOUNTER — Ambulatory Visit: Payer: Medicare Other | Admitting: Professional Counselor

## 2023-07-08 ENCOUNTER — Ambulatory Visit: Payer: Medicare Other | Attending: Internal Medicine

## 2023-07-08 DIAGNOSIS — F411 Generalized anxiety disorder: Secondary | ICD-10-CM | POA: Diagnosis not present

## 2023-07-08 DIAGNOSIS — Z952 Presence of prosthetic heart valve: Secondary | ICD-10-CM | POA: Insufficient documentation

## 2023-07-08 DIAGNOSIS — Z7901 Long term (current) use of anticoagulants: Secondary | ICD-10-CM | POA: Insufficient documentation

## 2023-07-08 DIAGNOSIS — F331 Major depressive disorder, recurrent, moderate: Secondary | ICD-10-CM

## 2023-07-08 LAB — POCT INR: INR: 3.7 — AB (ref 2.0–3.0)

## 2023-07-08 NOTE — Progress Notes (Signed)
      [facility] Counselor/Therapist Progress Note  Patient ID: Abigail Oliver, MRN: [id number],    Date: 07/08/2023  Time Spent: 11:13 AM to 12:10 PM  Treatment Type: Individual Therapy  Reported Symptoms: Worries, stress, sadness, interpersonal concerns, sleeplessness, restlessness, health concerns, phase of life concerns   Mental Status Exam:  Appearance:   Neat     Behavior:  Appropriate and Sharing  Motor:  Normal  Speech/Language:   Clear and Coherent and Normal Rate  Affect:  Appropriate and Congruent  Mood:  anxious and depressed  Thought process:  normal  Thought content:    WNL  Sensory/Perceptual disturbances:    WNL  Orientation:  oriented to person, place, time/date, and situation  Attention:  Good  Concentration:  Good  Memory:  WNL  Fund of knowledge:   Good  Insight:    Good  Judgment:   Good  Impulse Control:  Good   Risk Assessment: Danger to Self:  No Self-injurious Behavior: No Danger to Others: No Duty to Warn:no Physical Aggression / Violence:No  Access to Firearms a concern: No  Gang Involvement:No   Subjective: Patient presented to session to address concerns of anxiety and depression.  She reported minimal progress at this time.  She reported having taken a fall in the bathroom and having received medical care afterward with a CT scan that showed negative for brain bleed.  She reported having a heart monitor, with echocardiogram and ultrasound pending.  She voiced to be no longer taking Ativan due to doctors worry that peripheral vision's double vision may be a side effect.  Patient processed her worry and experience around her and her husband's health concerns, and regarding other family member's unfolding distressing circumstances.  Counselor actively listened, affirmed patient feelings and experience, helped facilitate insight around patient concerns, and helped reinforce patient need to protect herself and her health and energies, including  regarding boundary work and interpersonal sphere of life.  Interventions: Solution-Oriented/Positive Psychology, Humanistic/Existential, and Insight-Oriented  Diagnosis:   ICD-10-CM   1. Generalized anxiety disorder  F41.1     2. Major depressive disorder, recurrent episode, moderate (HCC)  F33.1       Plan: Patient is scheduled for follow-up; continue process work and developing coping skills.  Patient to prioritize her medical and health care and treatment, and continue to implement safety precautions as indicated with health concerns, and to implement boundaries interpersonally to protect herself emotionally and with stress exposure.  Progress note was dictated with Dragon and reviewed for accuracy.  Gaspar Bidding, [facility]

## 2023-07-08 NOTE — Patient Instructions (Signed)
Hold today only then continue taking warfarin 1 tablet daily except for 1/2 tablet on Tuesday, Thursday and Saturday. Repeat INR in 2 weeks. [facility] [phone]

## 2023-07-14 ENCOUNTER — Ambulatory Visit: Payer: Medicare Other | Admitting: Adult Health

## 2023-07-15 ENCOUNTER — Encounter: Payer: Self-pay | Admitting: Adult Health

## 2023-07-15 ENCOUNTER — Ambulatory Visit (INDEPENDENT_AMBULATORY_CARE_PROVIDER_SITE_OTHER): Payer: Medicare Other | Admitting: Adult Health

## 2023-07-15 VITALS — BP 110/42 | HR 70 | Temp 97.1°F | Resp 20 | Ht 62.0 in | Wt 177.8 lb

## 2023-07-15 DIAGNOSIS — I10 Essential (primary) hypertension: Secondary | ICD-10-CM | POA: Diagnosis not present

## 2023-07-15 DIAGNOSIS — R42 Dizziness and giddiness: Secondary | ICD-10-CM

## 2023-07-15 DIAGNOSIS — E1143 Type 2 diabetes mellitus with diabetic autonomic (poly)neuropathy: Secondary | ICD-10-CM

## 2023-07-15 DIAGNOSIS — Z952 Presence of prosthetic heart valve: Secondary | ICD-10-CM | POA: Diagnosis not present

## 2023-07-15 DIAGNOSIS — F5101 Primary insomnia: Secondary | ICD-10-CM | POA: Diagnosis not present

## 2023-07-15 DIAGNOSIS — I509 Heart failure, unspecified: Secondary | ICD-10-CM

## 2023-07-15 DIAGNOSIS — F418 Other specified anxiety disorders: Secondary | ICD-10-CM

## 2023-07-15 DIAGNOSIS — N1832 Chronic kidney disease, stage 3b: Secondary | ICD-10-CM | POA: Diagnosis not present

## 2023-07-15 MED ORDER — HYDROXYZINE PAMOATE 25 MG PO CAPS: 25.0000 mg | ORAL_CAPSULE | Freq: Every evening | ORAL | 3 refills | Status: DC | PRN

## 2023-07-15 MED ORDER — VALSARTAN 40 MG PO TABS
40.0000 mg | ORAL_TABLET | Freq: Every day | ORAL | 3 refills | Status: AC
Start: 2023-07-15 — End: ?

## 2023-07-15 MED ORDER — POTASSIUM CHLORIDE CRYS ER 20 MEQ PO TBCR: 20.0000 meq | EXTENDED_RELEASE_TABLET | Freq: Every day | ORAL | Status: DC

## 2023-07-15 NOTE — Progress Notes (Signed)
[facility] clinic  Provider:  Kenard Gower DNP  Code Status:  Full Code  Goals of Care:     07/15/2023    2:47 PM  Advanced Directives  Does Patient Have a Medical Advance Directive? Yes  Type of Estate agent of [place];Living will  Copy of Healthcare Power of Attorney in Chart? No - copy requested     Chief Complaint  Patient presents with   Medical Management of Chronic Issues    Discuss results. Discuss the need for flu, pneumonia and shingles.   Discussed the use of AI scribe software for clinical note transcription with the patient, who gave verbal consent to proceed.  HPI: Patient is a 78 y.o. female seen today for an acute visit to discuss lab result. She was accompanied by her husband.  She has been experiencing significant dizziness and lightheadedness for the past three weeks, which led to a fall without head injury. A CT scan of the head was negative. She continues to experience dizziness and blurry vision in her right eye, describing it as 'blurry' and seeing 'double' when looking to the side. Her right eye is also watery. She is scheduled for an ultrasound and echocardiogram.  Her blood pressure readings have been low, with a recent measurement of 118/42 mmHg. She takes valsartan 80 mg daily and metoprolol for hypertension, which she takes at night along with other medications. She is currently wearing a heart monitor. She takes torsemide, which was increased for three days along with potassium, but then returned to the normal dose. She stopped taking lorazepam due to potential dizziness. She takes Coumadin due to a St. Jude valve and has experienced fluctuations in her INR, with a recent reading of 5.1. She also takes potassium 20 MEQ daily, which was adjusted from twice daily when her diuretic dose was reduced.  She has chronic kidney disease and follows up with a nephrologist. Her recent GFR was 24, down from 36 three weeks prior. She reports  constant thirst and difficulty adhering to fluid restrictions.  She reports depression, which she describes as 'not real good,' and takes sertraline 50 mg daily. She has not been able to work due to her symptoms, which has been affecting her mood. She is scheduled to see a psychiatrist soon. She reports not sleeping well, getting only two to three hours of sleep per night.  She has a history of hyperlipidemia and takes rosuvastatin 20 mg daily. She also takes folic acid and baby aspirin 81 mg daily. She uses pantoprazole as needed for acid reflux. She is not a big meat eater and has been advised to limit fluid intake and avoid certain foods due to her chronic kidney disease. No chest pain, no fever, no recent infections, no significant ear wax buildup, no recent changes in hearing.    Past Medical History:  Diagnosis Date   Anemia    Anxiety    Aortic stenosis    a. Now has St Jude mechanical aortic valve (6/00). b. Echo (6/15) with EF 60-65%, mechanical aortic valve with mean gradient 27 mmHg.   Arthritis    a. Possible c-spine arthritis with pain down left arm.     Carotid artery disease (HCC)    a. Carotid dopplers (7/16) with 40-59% BICA stenosis.     Chronic angle-closure glaucoma(365.23)    Chronic diastolic CHF (congestive heart failure) (HCC)    a.  RHC (7/15) with mean RA 2, PA 22/11, mean PCWP 9, CI 3.79.  Contrast media allergy    Coronary atherosclerosis of native coronary artery    a. CABG at time of AVR in 6/00 with RIMA-RCA. b. Abnl nuc 11/2013 -> LHC (7/15) with patent RIMA-RCA, 80% mRCA, 80% pLAD with FFR 0.73, treated with DES to pLAD.   Depression    Essential hypertension    GERD (gastroesophageal reflux disease)    Hyperlipidemia    Low back pain    Neuromuscular disorder (HCC)    neuropathy   Peripheral vascular disease (HCC)    a, H/o Right SFA stent. b. Peripheral arterial dopplers (7/16) with right SFA stent patent.     PONV (postoperative nausea and vomiting)     N&V   Type II diabetes mellitus (HCC)     Past Surgical History:  Procedure Laterality Date   BILATERAL OOPHORECTOMY  05/24/1985   BREAST BIOPSY  05/24/2010   left   CARDIAC CATHETERIZATION  01/14/1999   normal LV function, severe aortic stenosis; 80% and 70% stenosis in RCA; mild 20% distal norrowing in L main with 20% proximal LAD stenosis, 40% diagonal stenosis and 20% proximal circumflex stenosis   CARDIAC VALVE REPLACEMENT  05/24/1998   aortic valve    CARDIOVASCULAR STRESS TEST  08/25/2011   R/L MV - EF 72%; no scintigraphic evidence of inducible MI; normal perfusionTID of 1.25 elevated - could indicate small vessle subendocardial ischemia; EKG NSR at 66, non diagnostic for ischemia   CARPAL TUNNEL RELEASE  05/25/1987   bilaterally   CATARACT EXTRACTION W/ INTRAOCULAR LENS  IMPLANT, BILATERAL  ~ 2007   CESAREAN SECTION  1969; 1971   CHOLECYSTECTOMY  05/24/1988   COLONOSCOPY N/A 10/27/2012   Procedure: COLONOSCOPY;  Surgeon: Theda Belfast, MD;  Location: [facility];  Service: Endoscopy;  Laterality: N/A;   CORONARY ARTERY BYPASS GRAFT  05/24/1998   RIMA to RCA   CORONARY/GRAFT ANGIOGRAPHY N/A 09/07/2021   Procedure: CORONARY/GRAFT ANGIOGRAPHY;  Surgeon: Orbie Pyo, MD;  Location: [facility] INVASIVE CV LAB;  Service: Cardiovascular;  Laterality: N/A;   DOPPLER ECHOCARDIOGRAPHY  03/29/2012   EF >55%; mild concentric LVH; stage 1 diastolic dysfunction, elevated LV filling pressure, dilated LA; MAC mild MR; St Jude AVR peak and mean gradients of and ; transvalvular gradients have increased (prev 23 and 14 respectively)   ESOPHAGOGASTRODUODENOSCOPY N/A 10/27/2012   Procedure: ESOPHAGOGASTRODUODENOSCOPY (EGD);  Surgeon: Theda Belfast, MD;  Location: [facility];  Service: Endoscopy;  Laterality: N/A;   FEMORAL ARTERY STENT  09/20/2011   6 x 40 Smart Nitinol self-expanding stent placed;  10/15/2031 -R SFA stent open and patent w/o evidence of restenosis   FRACTIONAL  FLOW RESERVE WIRE  12/14/2013   Procedure: FRACTIONAL FLOW RESERVE WIRE;  Surgeon: Laurey Morale, MD;  Location: [facility] CATH LAB;  Service: Cardiovascular;;   KNEE SURGERY Left    LACERATION REPAIR     right hand   LEFT AND RIGHT HEART CATHETERIZATION WITH CORONARY ANGIOGRAM N/A 12/14/2013   Procedure: LEFT AND RIGHT HEART CATHETERIZATION WITH CORONARY ANGIOGRAM;  Surgeon: Laurey Morale, MD;  Location: [facility] CATH LAB;  Service: Cardiovascular;  Laterality: N/A;   LOWER EXTREMITY ANGIOGRAM N/A 09/20/2011   Procedure: LOWER EXTREMITY ANGIOGRAM;  Surgeon: Runell Gess, MD;  Location: [facility] CATH LAB;  Service: Cardiovascular;  Laterality: N/A;   LYMPH NODE BIOPSY  06/25/2011   "core needle on 5"   needle biopsy  05/25/2007   "on ankles for nerve damage"   NEUROPLASTY / TRANSPOSITION ULNAR NERVE AT ELBOW  right   ORIF FEMUR FRACTURE Left 09/09/2021   Procedure: OPEN REDUCTION INTERNAL FIXATION (ORIF) DISTAL FEMUR FRACTURE;  Surgeon: Roby Lofts, MD;  Location: [facility] OR;  Service: Orthopedics;  Laterality: Left;   PERCUTANEOUS CORONARY STENT INTERVENTION (PCI-S)  12/14/2013   Procedure: PERCUTANEOUS CORONARY STENT INTERVENTION (PCI-S);  Surgeon: Laurey Morale, MD;  Location: [facility] CATH LAB;  Service: Cardiovascular;;  Prox LAD 3.00x12 Promus DES    PERIPHERAL ARTERIAL STENT GRAFT  09/20/2011   right SFA   REFRACTIVE SURGERY  ` 2004   "for glaucoma"   TONSILLECTOMY  05/24/1966   TRIGGER FINGER RELEASE Left 12/04/2015   Procedure: LEFT RING FINGER TRIGGER RELEASE;  Surgeon: Betha Loa, MD;  Location: [facility];  Service: Orthopedics;  Laterality: Left;   VAGINAL HYSTERECTOMY  05/25/1983   Fibroids    Allergies  Allergen Reactions   Atorvastatin     Muscle pain   Dapagliflozin     Other reaction(s): nausea   Gabapentin     Other reaction(s): stomach issues   Simvastatin Other (See Comments)    Muscle pain   Sulfa Antibiotics     unknown   Iodinated Contrast Media  Rash     Red rash after cardiac cath 1 wk ago, ? Contrast allergy, requires 13 hr prep now per [email or web address]      Outpatient Encounter Medications as of 07/15/2023  Medication Sig   Ascorbic Acid (VITAMIN C) 1000 MG tablet Take 1,000 mg by mouth every evening.   aspirin EC 81 MG tablet Take 81 mg by mouth every evening.   cyanocobalamin (VITAMIN B12) 1000 MCG tablet Take 1,000 mcg by mouth daily.   fluticasone (FLONASE) 50 MCG/ACT nasal spray Place 2 sprays into both nostrils as needed for allergies or rhinitis.   folic acid (FOLVITE) 800 MCG tablet Take 800 mcg by mouth daily.   hydrOXYzine (VISTARIL) 25 MG capsule Take 1 capsule (25 mg total) by mouth at bedtime as needed.   latanoprost (XALATAN) 0.005 % ophthalmic solution PLACE 1 DROP INTO BOTH EYES AT BEDTIME.   melatonin 5 MG TABS Take 1 tablet (5 mg total) by mouth at bedtime.   metoprolol succinate (TOPROL XL) 50 MG 24 hr tablet Take 1 tablet (50 mg total) by mouth daily. Take with or immediately following a meal.   Multiple Vitamin (MULTIVITAMIN WITH MINERALS) TABS tablet Take 2 tablets by mouth daily.   nitroGLYCERIN (NITROSTAT) 0.4 MG SL tablet Place 1 tablet (0.4 mg total) under the tongue every 5 (five) minutes as needed for chest pain.   pantoprazole (PROTONIX) 40 MG tablet Take 1 tablet (40 mg total) by mouth daily.   potassium chloride SA (KLOR-CON M20) 20 MEQ tablet Take 1 tablet (20 mEq total) by mouth 2 (two) times daily. increase her potassium chloride to 40 mEq twice daily for 3 days (while on higher dose of Torsemide) then back down to 20 mEq twice daily.   rosuvastatin (CRESTOR) 20 MG tablet Take 1 tablet (20 mg total) by mouth every evening.   sertraline (ZOLOFT) 50 MG tablet Take 1 tablet (50 mg total) by mouth daily.   torsemide (DEMADEX) 20 MG tablet Take 1 tablet (20 mg total) by mouth daily. increase her Torsemide to 20mg  twice daily for 3 days THEN BACK TO ONCE DAILY   valsartan (DIOVAN) 80 MG tablet 1  tablet Orally Once a day   warfarin (COUMADIN) 3 MG tablet TAKE 1/2 TO 1 TABLET BY MOUTH DAILY AS DIRECTED (Patient taking  differently: Take 1.5-3 mg by mouth as directed. TAKE 1 TABLET (3 MG) ON Mon,Wed,Fri,Sun & TAKE 1/2 TABLET (1.5 MG) on Tues,Thurs,Sat)   No facility-administered encounter medications on file as of 07/15/2023.    Review of Systems:  Review of Systems  Constitutional:  Negative for appetite change, chills, fatigue and fever.  HENT:  Negative for congestion, hearing loss, rhinorrhea and sore throat.   Eyes: Negative.   Respiratory:  Negative for cough, shortness of breath and wheezing.   Cardiovascular:  Negative for chest pain, palpitations and leg swelling.  Gastrointestinal:  Negative for abdominal pain, constipation, diarrhea, nausea and vomiting.  Genitourinary:  Negative for dysuria.  Musculoskeletal:  Negative for arthralgias, back pain and myalgias.  Skin:  Negative for color change, rash and wound.  Neurological:  Negative for dizziness, weakness and headaches.  Psychiatric/Behavioral:  Positive for sleep disturbance. Negative for behavioral problems. The patient is not nervous/anxious.     Health Maintenance  Topic Date Due   Pneumonia Vaccine 44+ Years old (1 of 2 - PCV) Never done   Zoster Vaccines- Shingrix (1 of 2) Never done   INFLUENZA VACCINE  12/23/2022   COVID-19 Vaccine (4 - 2024-25 season) 01/23/2023   HEMOGLOBIN A1C  11/23/2023   FOOT EXAM  02/16/2024   OPHTHALMOLOGY EXAM  03/17/2024   Medicare Annual Wellness (AWV)  05/26/2024   Diabetic kidney evaluation - Urine ACR  06/23/2024   Diabetic kidney evaluation - eGFR measurement  07/04/2024   DTaP/Tdap/Td (4 - Td or Tdap) 03/16/2026   DEXA SCAN  Completed   Hepatitis C Screening  Completed   HPV VACCINES  Aged Out   Colonoscopy  Discontinued    Physical Exam: Vitals:   07/15/23 0940  BP: (!) 118/40  Pulse: 70  Resp: 20  Temp: (!) 97.1 F (36.2 C)  SpO2: 99%  Weight: 177 lb 12.8 oz  (80.6 kg)  Height: 5\' 2"  (1.575 m)   Body mass index is 32.52 kg/m. Physical Exam Constitutional:      Appearance: She is obese.  HENT:     Head: Normocephalic and atraumatic.     Nose: Nose normal.     Mouth/Throat:     Mouth: Mucous membranes are moist.  Eyes:     Conjunctiva/sclera: Conjunctivae normal.  Cardiovascular:     Rate and Rhythm: Normal rate and regular rhythm.     Comments: Has a heart monitor Pulmonary:     Effort: Pulmonary effort is normal.     Breath sounds: Normal breath sounds.  Abdominal:     General: Bowel sounds are normal.     Palpations: Abdomen is soft.  Musculoskeletal:        General: Normal range of motion.     Cervical back: Normal range of motion.  Skin:    General: Skin is warm and dry.  Neurological:     General: No focal deficit present.     Mental Status: She is alert and oriented to person, place, and time.  Psychiatric:        Mood and Affect: Mood normal.        Behavior: Behavior normal.        Thought Content: Thought content normal.        Judgment: Judgment normal.     Labs reviewed: Basic Metabolic Panel: Recent Labs    03/31/23 0441 06/24/23 1331 07/05/23 1529  NA 138 138 137  K 3.9 3.9 4.5  CL 105 101 98  CO2 22 26  28  GLUCOSE 165* 241* 173*  BUN 29* 35* 66*  CREATININE 1.30* 1.49* 2.08*  CALCIUM 8.7* 9.4 10.1   Liver Function Tests: Recent Labs    01/10/23 0000 03/30/23 1805  AST 20 38  ALT 11 27  ALKPHOS 80 64  BILITOT  --  0.9  PROT  --  7.2  ALBUMIN 4.6 4.0   Recent Labs    03/30/23 1805 03/31/23 0441  LIPASE 54* 42   No results for input(s): "AMMONIA" in the last 8760 hours. CBC: Recent Labs    01/10/23 0000 02/16/23 0936 03/30/23 1805 03/31/23 0441 04/01/23 0346 06/24/23 1201  WBC 6.2 4.8   < > 6.9 5.1 5.3  NEUTROABS 3.50 2,818  --   --   --  3,286  HGB 10.5* 9.8*   < > 9.6* 8.9* 10.2*  HCT 32* 31.6*   < > 30.4* 28.9* 32.3*  MCV  --  88.8   < > 90.5 87.3 87.8  PLT 128* 113*   <  > 103* 84* 114*   < > = values in this interval not displayed.   Lipid Panel: Recent Labs    02/16/23 0936 05/26/23 1041  CHOL 167 145  HDL 46* 41*  LDLCALC 83 71  TRIG 286* 253*  CHOLHDL 3.6 3.5   Lab Results  Component Value Date   HGBA1C 7.1 (H) 05/26/2023    Procedures since last visit: CT HEAD WO CONTRAST ( ) Result Date: 07/06/2023 CLINICAL DATA:  Syncope/presyncope with cerebrovascular cause suspected. Double vision. Recent fall with history of Coumadin use EXAM: CT HEAD WITHOUT CONTRAST TECHNIQUE: Contiguous axial images were obtained from the base of the skull through the vertex without intravenous contrast. RADIATION DOSE REDUCTION: This exam was performed according to the departmental dose-optimization program which includes automated exposure control, adjustment of the mA and/or kV according to patient size and/or use of iterative reconstruction technique. COMPARISON:  09/04/2021 FINDINGS: Brain: No evidence of acute infarction, hemorrhage, hydrocephalus, extra-axial collection or mass lesion/mass effect. Cerebral volume loss and chronic small vessel disease that is stable. Vascular: No hyperdense vessel or unexpected calcification. Skull: Normal. Negative for fracture or focal lesion. Sinuses/Orbits: No acute finding. IMPRESSION: No acute or reversible finding. Electronically Signed   By: Tiburcio Pea M.D.   On: 07/06/2023 11:16    Assessment/Plan  1. Dizziness and giddiness -  Ongoing for three weeks with a fall but no head trauma. CT head negative for bleed. Right eye blurry. No ear infection or wax build-up. Carotid ultrasound and echocardiogram scheduled. -Continue with scheduled carotid ultrasound and echocardiogram. -Check blood pressure and heart rate daily at home and log results. -  fall precautions  2. Essential hypertension (Primary) -  Low blood pressure readings during visit. Currently on Valsartan 80mg  daily. -Reduce Valsartan to 40mg  daily. -   continue Metoprolol succinate 50 mg daily - valsartan (DIOVAN) 40 MG tablet; Take 1 tablet (40 mg total) by mouth daily.  Dispense: 30 tablet; Refill: 3  3. Stage 3b chronic kidney disease (HCC) -  Decreased kidney function noted on recent labs (GFR 24 from 36 three weeks prior). -Repeat BMP today to assess kidney function. -  follows up with nephrology - Basic Metabolic Panel with eGFR  4. Primary insomnia -  Poor sleep despite taking melatonin. -Prescribe Vistaril (hydroxyzine) for use at bedtime as needed. - hydrOXYzine (VISTARIL) 25 MG capsule; Take 1 capsule (25 mg total) by mouth at bedtime as needed.  Dispense: 30 capsule; Refill: 3  5. Chronic heart failure, unspecified heart failure type (HCC) -  On Torsemide 20mg  daily and Potassium daily. -Continue current regimen. -  follows up with cardiology - potassium chloride SA (KLOR-CON M20) 20 MEQ tablet; Take 1 tablet (20 mEq total) by mouth daily.  6. H/O aortic valve replacement -  On Coumadin 3mg  every other day alternating with 0.5 tablet every other day. -Continue current regimen.  7. Depression with anxiety -  Reports feeling not good. On Sertraline 50mg  daily. -Continue current regimen and monitor closely. -  follow up with psychiatry  8. Type II diabetes mellitus with peripheral autonomic neuropathy (HCC) Lab Results  Component Value Date   HGBA1C 7.1 (H) 05/26/2023    -  not on medication    Follow-up in 1 month.    Labs/tests ordered:   BMP   Zadie Deemer Medina-Vargas, NP

## 2023-07-16 LAB — BASIC METABOLIC PANEL WITH GFR
BUN/Creatinine Ratio: 28 (calc) — ABNORMAL HIGH (ref 6–22)
BUN: 48 mg/dL — ABNORMAL HIGH (ref 7–25)
CO2: 26 mmol/L (ref 20–32)
Calcium: 9.4 mg/dL (ref 8.6–10.4)
Chloride: 102 mmol/L (ref 98–110)
Creat: 1.71 mg/dL — ABNORMAL HIGH (ref 0.60–1.00)
Glucose, Bld: 234 mg/dL — ABNORMAL HIGH (ref 65–139)
Potassium: 4.3 mmol/L (ref 3.5–5.3)
Sodium: 138 mmol/L (ref 135–146)
eGFR: 30 mL/min/{1.73_m2} — ABNORMAL LOW (ref >=60)

## 2023-07-16 NOTE — Progress Notes (Signed)
-    GFR 30, improved from 24, continue current medications

## 2023-07-18 ENCOUNTER — Ambulatory Visit (HOSPITAL_COMMUNITY)
Admission: RE | Admit: 2023-07-18 | Discharge: 2023-07-18 | Disposition: A | Discharge status: Home / Self Care | Payer: Medicare Other | Source: Ambulatory Visit | Attending: Student | Admitting: Student

## 2023-07-18 ENCOUNTER — Ambulatory Visit: Payer: Medicare Other

## 2023-07-18 DIAGNOSIS — Z952 Presence of prosthetic heart valve: Secondary | ICD-10-CM

## 2023-07-18 DIAGNOSIS — Z7901 Long term (current) use of anticoagulants: Secondary | ICD-10-CM

## 2023-07-18 DIAGNOSIS — I6523 Occlusion and stenosis of bilateral carotid arteries: Secondary | ICD-10-CM | POA: Diagnosis not present

## 2023-07-18 LAB — POCT INR: INR: 4.7 — AB (ref 2.0–3.0)

## 2023-07-18 NOTE — Patient Instructions (Signed)
 Hold today only then Decrease to 0.5 tablet daily except for 1 tablet on Monday, Wednesday and Friday.   Repeat INR in 2 weeks. Coumadin Clinic [phone]

## 2023-07-19 ENCOUNTER — Telehealth: Payer: Self-pay

## 2023-07-19 NOTE — Telephone Encounter (Signed)
 Spoke with patient this afternoon because she that she was seen with Medina-Vargas, Monina C, NP on last week and she just saw the cardiology on yesterday and she stated that is the medication that is causing blurring vision and still having it due to the medication of the side of effects of Valsartan. Patient wanted to asked if Medina-Vargas, Monina C, NP can reduce the medication.  Please Advise.   Message sent to Medina-Vargas, Margit Banda, NP

## 2023-07-19 NOTE — Telephone Encounter (Signed)
 Valsartan was cut in half during last visit, from 80 mg to 40 mg. What is your BP now? Would you like to follow up in the clinic to check BP and discuss treatment options?

## 2023-07-20 NOTE — Telephone Encounter (Signed)
 Spoke with patient, patient states she has not received her b/p cuff yet, so she does not know what her b/p is running. Once patient gets cuff she is aware to send B/P readings via mychart. Patient already had appointment for 08/12/23 and did not wish to change to a sooner appointment.

## 2023-07-20 NOTE — Telephone Encounter (Signed)
 Noted.

## 2023-07-21 ENCOUNTER — Ambulatory Visit (HOSPITAL_COMMUNITY): Payer: Medicare Other | Attending: Student

## 2023-07-21 DIAGNOSIS — R0602 Shortness of breath: Secondary | ICD-10-CM | POA: Diagnosis not present

## 2023-07-21 LAB — ECHOCARDIOGRAM COMPLETE
AR max vel: 1.1 cm2
AV Area VTI: 1.15 cm2
AV Area mean vel: 1.12 cm2
AV Mean grad: 14 mm[Hg]
AV Peak grad: 27.4 mm[Hg]
Ao pk vel: 2.62 m/s
Area-P 1/2: 2.31 cm2
S' Lateral: 3 cm

## 2023-07-22 ENCOUNTER — Ambulatory Visit: Payer: Medicare Other | Admitting: Physician Assistant

## 2023-07-22 ENCOUNTER — Encounter: Payer: Self-pay | Admitting: Physician Assistant

## 2023-07-22 VITALS — BP 145/66 | HR 73

## 2023-07-22 DIAGNOSIS — F5105 Insomnia due to other mental disorder: Secondary | ICD-10-CM

## 2023-07-22 DIAGNOSIS — F331 Major depressive disorder, recurrent, moderate: Secondary | ICD-10-CM

## 2023-07-22 DIAGNOSIS — F411 Generalized anxiety disorder: Secondary | ICD-10-CM

## 2023-07-22 DIAGNOSIS — F99 Mental disorder, not otherwise specified: Secondary | ICD-10-CM | POA: Diagnosis not present

## 2023-07-22 MED ORDER — SERTRALINE HCL 100 MG PO TABS: 100.0000 mg | ORAL_TABLET | Freq: Every day | ORAL | 1 refills | Status: DC

## 2023-07-22 MED ORDER — TRAZODONE HCL 100 MG PO TABS: 50.0000 mg | ORAL_TABLET | Freq: Every evening | ORAL | 1 refills | Status: DC | PRN

## 2023-07-22 NOTE — Progress Notes (Signed)
 [facility] MD/PA/NP Initial Note  07/22/2023 5:39 PM Abigail Oliver  MRN:  [id number]  Chief Complaint:  Chief Complaint   Establish Care    HPI:  Trouble sleeping on and off for decades. The insomnia has been worse for several years. Even worse lately.  She has trouble falling asleep, sometimes it is not until 2 or 3 AM.  Then she is up every hour.  She is not sure how long she stays up before she can drift back to sleep.  She may get 5 hours of interrupted sleep per night.  She feels groggy the next day and it sometimes makes her irritable.  She does not drink caffeine and does not usually get on the computer before bed.  She does watch TV sometimes though.   She has tried numerous medications that either did not work or caused side effects.  She is concerned about medication interactions.  She has heart problems and does not want anything that would cause conflict.  See list below.  A lot of stress with her daughter and not great choices in her life, now in [place].  Even before those type stressors in her life she has had trouble sleeping.   States she feels a little depressed, more so being "down" because of things in her life right now.  She is able to enjoy things when she has time.  She likes to go out with friends.  Energy and motivation are fair to good depending on the day.  Work is going well.  She is a Interior and spatial designer, works Armed forces operational officer.  No extreme sadness, tearfulness, or feelings of hopelessness.   ADLs and personal hygiene are normal.   Denies any changes in concentration, making decisions, or remembering things.  Appetite has not changed.  Weight is stable.  Denies suicidal or homicidal thoughts.  Patient denies increased energy with decreased need for sleep, increased talkativeness, racing thoughts, impulsivity or risky behaviors, increased spending, increased libido, grandiosity, increased irritability or anger, paranoia, or hallucinations.  Visit Diagnosis:    ICD-10-CM   1.  Generalized anxiety disorder  F41.1     2. Insomnia due to other mental disorder  F51.05    F99     3. Major depressive disorder, recurrent episode, moderate (HCC)  F33.1       Past Psychiatric History:   Past medications for mental health diagnoses include: Ativan didn't help, Cymbalta, Mirtzapine took 1 time and it didn't help. Trazodone at 50 mg didn't work.   Past Medical History:  Past Medical History:  Diagnosis Date   Anemia    Anxiety    Aortic stenosis    a. Now has St Jude mechanical aortic valve (6/00). b. Echo (6/15) with EF 60-65%, mechanical aortic valve with mean gradient 27 mmHg.   Arthritis    a. Possible c-spine arthritis with pain down left arm.     Carotid artery disease (HCC)    a. Carotid dopplers (7/16) with 40-59% BICA stenosis.     Chronic angle-closure glaucoma(365.23)    Chronic diastolic CHF (congestive heart failure) (HCC)    a.  RHC (7/15) with mean RA 2, PA 22/11, mean PCWP 9, CI 3.79.   Contrast media allergy    Coronary atherosclerosis of native coronary artery    a. CABG at time of AVR in 6/00 with RIMA-RCA. b. Abnl nuc 11/2013 -> LHC (7/15) with patent RIMA-RCA, 80% mRCA, 80% pLAD with FFR 0.73, treated with DES to pLAD.   Depression  Essential hypertension    GERD (gastroesophageal reflux disease)    Hyperlipidemia    Low back pain    Neuromuscular disorder (HCC)    neuropathy   Peripheral vascular disease (HCC)    a, H/o Right SFA stent. b. Peripheral arterial dopplers (7/16) with right SFA stent patent.     PONV (postoperative nausea and vomiting)    N&V   Type II diabetes mellitus (HCC)     Past Surgical History:  Procedure Laterality Date   BILATERAL OOPHORECTOMY  05/24/1985   BREAST BIOPSY  05/24/2010   left   CARDIAC CATHETERIZATION  01/14/1999   normal LV function, severe aortic stenosis; 80% and 70% stenosis in RCA; mild 20% distal norrowing in L main with 20% proximal LAD stenosis, 40% diagonal stenosis and 20% proximal  circumflex stenosis   CARDIAC VALVE REPLACEMENT  05/24/1998   aortic valve    CARDIOVASCULAR STRESS TEST  08/25/2011   R/L MV - EF 72%; no scintigraphic evidence of inducible MI; normal perfusionTID of 1.25 elevated - could indicate small vessle subendocardial ischemia; EKG NSR at 66, non diagnostic for ischemia   CARPAL TUNNEL RELEASE  05/25/1987   bilaterally   CATARACT EXTRACTION W/ INTRAOCULAR LENS  IMPLANT, BILATERAL  ~ 2007   CESAREAN SECTION  1969; 1971   CHOLECYSTECTOMY  05/24/1988   COLONOSCOPY N/A 10/27/2012   Procedure: COLONOSCOPY;  Surgeon: Theda Belfast, MD;  Location: [facility];  Service: Endoscopy;  Laterality: N/A;   CORONARY ARTERY BYPASS GRAFT  05/24/1998   RIMA to RCA   CORONARY/GRAFT ANGIOGRAPHY N/A 09/07/2021   Procedure: CORONARY/GRAFT ANGIOGRAPHY;  Surgeon: Orbie Pyo, MD;  Location: [facility] INVASIVE CV LAB;  Service: Cardiovascular;  Laterality: N/A;   DOPPLER ECHOCARDIOGRAPHY  03/29/2012   EF >55%; mild concentric LVH; stage 1 diastolic dysfunction, elevated LV filling pressure, dilated LA; MAC mild MR; St Jude AVR peak and mean gradients of and ; transvalvular gradients have increased (prev 23 and 14 respectively)   ESOPHAGOGASTRODUODENOSCOPY N/A 10/27/2012   Procedure: ESOPHAGOGASTRODUODENOSCOPY (EGD);  Surgeon: Theda Belfast, MD;  Location: [facility];  Service: Endoscopy;  Laterality: N/A;   FEMORAL ARTERY STENT  09/20/2011   6 x 40 Smart Nitinol self-expanding stent placed;  10/15/2031 -R SFA stent open and patent w/o evidence of restenosis   FRACTIONAL FLOW RESERVE WIRE  12/14/2013   Procedure: FRACTIONAL FLOW RESERVE WIRE;  Surgeon: Laurey Morale, MD;  Location: [facility] CATH LAB;  Service: Cardiovascular;;   KNEE SURGERY Left    LACERATION REPAIR     right hand   LEFT AND RIGHT HEART CATHETERIZATION WITH CORONARY ANGIOGRAM N/A 12/14/2013   Procedure: LEFT AND RIGHT HEART CATHETERIZATION WITH CORONARY ANGIOGRAM;  Surgeon: Laurey Morale,  MD;  Location: [facility] CATH LAB;  Service: Cardiovascular;  Laterality: N/A;   LOWER EXTREMITY ANGIOGRAM N/A 09/20/2011   Procedure: LOWER EXTREMITY ANGIOGRAM;  Surgeon: Runell Gess, MD;  Location: [facility] CATH LAB;  Service: Cardiovascular;  Laterality: N/A;   LYMPH NODE BIOPSY  06/25/2011   "core needle on 5"   needle biopsy  05/25/2007   "on ankles for nerve damage"   NEUROPLASTY / TRANSPOSITION ULNAR NERVE AT ELBOW     right   ORIF FEMUR FRACTURE Left 09/09/2021   Procedure: OPEN REDUCTION INTERNAL FIXATION (ORIF) DISTAL FEMUR FRACTURE;  Surgeon: Roby Lofts, MD;  Location: [facility] OR;  Service: Orthopedics;  Laterality: Left;   PERCUTANEOUS CORONARY STENT INTERVENTION (PCI-S)  12/14/2013   Procedure: PERCUTANEOUS CORONARY STENT  INTERVENTION (PCI-S);  Surgeon: Laurey Morale, MD;  Location: [facility] CATH LAB;  Service: Cardiovascular;;  Prox LAD 3.00x12 Promus DES    PERIPHERAL ARTERIAL STENT GRAFT  09/20/2011   right SFA   REFRACTIVE SURGERY  ` 2004   "for glaucoma"   TONSILLECTOMY  05/24/1966   TRIGGER FINGER RELEASE Left 12/04/2015   Procedure: LEFT RING FINGER TRIGGER RELEASE;  Surgeon: Betha Loa, MD;  Location:  SURGERY CENTER;  Service: Orthopedics;  Laterality: Left;   VAGINAL HYSTERECTOMY  05/25/1983   Fibroids    Family Psychiatric History:  See below  Family History:  Family History  Problem Relation Age of Onset   Colon cancer Mother    COPD Mother    Emphysema Mother    Heart disease Brother    Cancer Maternal Grandfather    Cancer Maternal Grandmother    Drug abuse Daughter    Crohn's disease Son    Diabetes Neg Hx     Social History:  Social History   Socioeconomic History   Marital status: Married    Spouse name: Jomarie Longs   Number of children: 2   Years of education: 14   Highest education level: Not on file  Occupational History    Comment: Producer, television/film/video  Tobacco Use   Smoking status: Never   Smokeless tobacco: Never  Vaping Use   Vaping  status: Never Used  Substance and Sexual Activity   Alcohol use: Not Currently   Drug use: Not Currently   Sexual activity: Never  Other Topics Concern   Not on file  Social History Narrative   Pt is a high school graduate with 2 years of college. Married in 1967 she has 1 son born 55 and 1 daughter born 70 and 1 grandchild. 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      Caffeine use- 2-3 /day, soda, tea       Lives with husband one story home   Social Drivers of Health   Financial Resource Strain: Low Risk  (07/22/2023)   Overall Financial Resource Strain (CARDIA)    Difficulty of Paying Living Expenses: Not hard at all  Food Insecurity: No Food Insecurity (07/22/2023)   Hunger Vital Sign    Worried About Running Out of Food in the Last Year: Never true    Ran Out of Food in the Last Year: Never true  Transportation Needs: No Transportation Needs (07/22/2023)   PRAPARE - Administrator, Civil Service (Medical): No    Lack of Transportation (Non-Medical): No  Physical Activity: Inactive (07/22/2023)   Exercise Vital Sign    Days of Exercise per Week: 0 days    Minutes of Exercise per Session: 0 min  Stress: Stress Concern Present (07/22/2023)   [employer] of Occupational Health - Occupational Stress Questionnaire    Feeling of Stress : Very much  Social Connections: Socially Isolated (07/22/2023)   Social Connection and Isolation Panel [NHANES]    Frequency of Communication with Friends and Family: Once a week    Frequency of Social Gatherings with Friends and Family: Once a week    Attends Religious Services: Never    Database administrator or Organizations: No    Attends Banker Meetings: Never    Marital Status: Married    Allergies:  Allergies  Allergen Reactions   Atorvastatin     Muscle pain   Dapagliflozin     Other reaction(s): nausea  Gabapentin     Other reaction(s): stomach issues   Simvastatin Other  (See Comments)    Muscle pain   Sulfa Antibiotics     unknown   Iodinated Contrast Media Rash     Red rash after cardiac cath 1 wk ago, ? Contrast allergy, requires 13 hr prep now per [email or web address]      Metabolic Disorder Labs: Lab Results  Component Value Date   HGBA1C 7.1 (H) 05/26/2023   MPG 157 05/26/2023   MPG 140 02/16/2023   No results found for: "PROLACTIN" Lab Results  Component Value Date   CHOL 145 05/26/2023   TRIG 253 (H) 05/26/2023   HDL 41 (L) 05/26/2023   CHOLHDL 3.5 05/26/2023   VLDL 44 (H) 01/17/2019   LDLCALC 71 05/26/2023   LDLCALC 83 02/16/2023   Lab Results  Component Value Date   TSH 2.99 04/27/2022   TSH 3.670 01/30/2021    Therapeutic Level Labs: No results found for: "LITHIUM" No results found for: "VALPROATE" No results found for: "CBMZ"  Current Medications: Current Outpatient Medications  Medication Sig Dispense Refill   Ascorbic Acid (VITAMIN C) 1000 MG tablet Take 1,000 mg by mouth every evening.     aspirin EC 81 MG tablet Take 81 mg by mouth every evening.     cyanocobalamin (VITAMIN B12) 1000 MCG tablet Take 1,000 mcg by mouth daily.     fluticasone (FLONASE) 50 MCG/ACT nasal spray Place 2 sprays into both nostrils as needed for allergies or rhinitis.     folic acid (FOLVITE) 800 MCG tablet Take 800 mcg by mouth daily.     hydrOXYzine (VISTARIL) 25 MG capsule Take 1 capsule (25 mg total) by mouth at bedtime as needed. 30 capsule 3   latanoprost (XALATAN) 0.005 % ophthalmic solution PLACE 1 DROP INTO BOTH EYES AT BEDTIME. 2.5 mL 2   melatonin 5 MG TABS Take 1 tablet (5 mg total) by mouth at bedtime.     metoprolol succinate (TOPROL XL) 50 MG 24 hr tablet Take 1 tablet (50 mg total) by mouth daily. Take with or immediately following a meal. 90 tablet 3   Multiple Vitamin (MULTIVITAMIN WITH MINERALS) TABS tablet Take 2 tablets by mouth daily.     nitroGLYCERIN (NITROSTAT) 0.4 MG SL tablet Place 1 tablet (0.4 mg total) under  the tongue every 5 (five) minutes as needed for chest pain. 20 tablet 12   pantoprazole (PROTONIX) 40 MG tablet Take 1 tablet (40 mg total) by mouth daily.     potassium chloride SA (KLOR-CON M20) 20 MEQ tablet Take 1 tablet (20 mEq total) by mouth daily.     rosuvastatin (CRESTOR) 20 MG tablet Take 1 tablet (20 mg total) by mouth every evening. 30 tablet 6   sertraline (ZOLOFT) 100 MG tablet Take 1 tablet (100 mg total) by mouth daily. 30 tablet 1   torsemide (DEMADEX) 20 MG tablet Take 1 tablet (20 mg total) by mouth daily. increase her Torsemide to 20mg  twice daily for 3 days THEN BACK TO ONCE DAILY     traZODone (DESYREL) 100 MG tablet Take 0.5-2 tablets (50-200 mg total) by mouth at bedtime as needed for sleep. 60 tablet 1   valsartan (DIOVAN) 40 MG tablet Take 1 tablet (40 mg total) by mouth daily. 30 tablet 3   warfarin (COUMADIN) 3 MG tablet TAKE 1/2 TO 1 TABLET BY MOUTH DAILY AS DIRECTED (Patient taking differently: Take 1.5-3 mg by mouth as directed. TAKE 1 TABLET (3 MG) ON  Mon,Wed,Fri,Sun & TAKE 1/2 TABLET (1.5 MG) on Tues,Thurs,Sat) 90 tablet 0   No current facility-administered medications for this visit.    Medication Side Effects: none  Orders placed this visit:  No orders of the defined types were placed in this encounter.   Psychiatric Specialty Exam:  Review of Systems  Constitutional: Negative.   HENT: Negative.    Eyes:  Positive for visual disturbance.  Respiratory: Negative.    Cardiovascular:  Positive for leg swelling.  Endocrine: Negative.   Genitourinary: Negative.   Musculoskeletal:  Positive for arthralgias and back pain.  Skin: Negative.   Allergic/Immunologic: Negative.   Neurological: Negative.   Hematological: Negative.   Psychiatric/Behavioral:         See HPI    Blood pressure (!) 145/66, pulse 73.There is no height or weight on file to calculate BMI.  General Appearance: Casual, Well Groomed, and Obese  Eye Contact:  Good  Speech:  Clear and  Coherent and Normal Rate  Volume:  Normal  Mood:  Anxious  Affect:  Congruent  Thought Process:  Goal Directed and Descriptions of Associations: Circumstantial  Orientation:  Full (Time, Place, and Person)  Thought Content: Logical   Suicidal Thoughts:  No  Homicidal Thoughts:  No  Memory:  WNL  Judgement:  Good  Insight:  Good  Psychomotor Activity:  Normal  Concentration:  Concentration: Good  Recall:  Good  Fund of Knowledge: Good  Language: Good  Assets:  Communication Skills Desire for Improvement Financial Resources/Insurance Housing Transportation Vocational/Educational  ADL's:  Intact  Cognition: WNL  Prognosis:  Good   Screenings:  CAGE-AID    Flowsheet Row ED to Hosp-Admission (Discharged) from 09/04/2021 in [facility]  CAGE-AID Score 0      GAD-7    Flowsheet Row Office Visit from 07/22/2023 in [facility] Counselor from 05/09/2023 in [facility] Counselor from 01/07/2023 in [facility] Counselor from 12/07/2022 in [facility]  Total GAD-7 Score 12 18 7 20       PHQ2-9    Flowsheet Row Office Visit from 07/22/2023 in [facility] Office Visit from 07/15/2023 in [facility] Office Visit from 05/26/2023 in [facility] Counselor from 05/09/2023 in [facility] Office Visit from 02/16/2023 in [facility]  PHQ-2 Total Score 2 2 2 1 6   PHQ-9 Total Score 7 5 11 10 24       Flowsheet Row ED to Hosp-Admission (Discharged) from 03/30/2023 in [facility] ED to Hosp-Admission (Discharged) from 09/04/2021 in [facility]  C-SSRS RISK CATEGORY No Risk No Risk      Receiving Psychotherapy: Yes  with Lieutenant Diego, [facility]  Treatment  Plan/Recommendations:   PDMP reviewed.  Ativan filled 06/24/2023. I provided 65 minutes of face to face time during this encounter, including time spent before and after the visit in records review, medical decision making, counseling pertinent to today's visit, and charting.   [name] and I discussed different treatment options.  I prefer not to prescribe a benzodiazepine because of the higher risk of confusion and falls in the elderly.  She does not like the way the Ativan made her feel any way and does not want to take it anymore.  She has taken trazodone in the past but only  up to 50 mg and it was not effective.  I recommend retrying that because we at least know she did not have any adverse effects from it.  She is agreeable.  Another option may be a tricyclic, even low-dose amitriptyline at 10 mg may be effective.  But I would want to discuss with her cardiologist before I prescribe that.  Sleep hygiene was discussed.  The anxiety and depression are not well treated.  I recommend increasing the Zoloft as she is at a pretty low dose right now.  She would like to increase it.  No other medication changes will be made.  Recommend healthy lifestyle, 30 minutes of exercise daily if at all possible.  Healthy diet.  Continue hydroxyzine 25 mg, 1 p.o. nightly as needed.  (It has not been effective however.) Increase Zoloft to 100 mg, 1 p.o. daily. Restart trazodone 100 mg, 1/2-2 p.o. nightly as needed sleep. Continue vitamins and supplements as listed on med list. Continue therapy with Adalberto Ill, [facility]. Return in 6 to 8 weeks.  Melony Overly, PA-C

## 2023-07-25 ENCOUNTER — Ambulatory Visit: Payer: Medicare Other | Admitting: Professional Counselor

## 2023-07-25 ENCOUNTER — Encounter: Payer: Self-pay | Admitting: Professional Counselor

## 2023-07-25 DIAGNOSIS — F331 Major depressive disorder, recurrent, moderate: Secondary | ICD-10-CM

## 2023-07-25 DIAGNOSIS — F411 Generalized anxiety disorder: Secondary | ICD-10-CM

## 2023-07-25 NOTE — Progress Notes (Signed)
      [facility] Counselor/Therapist Progress Note  Patient ID: Abigail Oliver, MRN: [id number],    Date: 07/25/2023  Time Spent: 10:08 AM to 11:09 AM  Treatment Type: Individual Therapy  Reported Symptoms: Somatic concerns including high blood pressure, stress rash, emotional eating; worries, preoccupying thoughts, stress, interpersonal concerns, low mood, sadness  Mental Status Exam:  Appearance:   Neat     Behavior:  Appropriate and Sharing  Motor:  Normal  Speech/Language:   Clear and Coherent and Normal Rate  Affect:  Appropriate and Congruent  Mood:  sad  Thought process:  normal  Thought content:    WNL  Sensory/Perceptual disturbances:    WNL  Orientation:  oriented to person, place, time/date, and situation  Attention:  Good  Concentration:  Good  Memory:  WNL  Fund of knowledge:   Good  Insight:    Good  Judgment:   Good  Impulse Control:  Good   Risk Assessment: Danger to Self:  No Self-injurious Behavior: No Danger to Others: No Duty to Warn:no Physical Aggression / Violence:No  Access to Firearms a concern: No  Gang Involvement:No   Subjective: Patient presented to session to address concerns of anxiety and depression.  She reported minimal progress at this time.  She processed continued legal concern deliberation of her daughters and the exacerbated stress, worry and sadness patient is feeling as a result.  She voiced an increase in sertraline and trazodone as helpful per prescribing providers recent change to medication regimen.  Patient exhibited struggle with mobility which counselor addressed; patient identified her knee "crackling" and for it "to feel like it is giving out".  Counselor encouraged patient to consider returning to use of walker versus cane, and to continue receiving regular medical team engagement to address patient health concerns, and expressed her concern for patient wellbeing.  Counselor help patient identifiy coping skills and outlets  which she identified as crochet, puzzles, word games, crafts, giving haircuts.  Patient identified personal goal between sessions to continue to implement boundaries with her daughter and to protect her energy and prioritize her health at this time.  Interventions: Solution-Oriented/Positive Psychology, Humanistic/Existential, and Insight-Oriented  Diagnosis:   ICD-10-CM   1. Generalized anxiety disorder  F41.1     2. Major depressive disorder, recurrent episode, moderate (HCC)  F33.1       Plan: Patient is scheduled for follow-up; continue process work and developing coping skills.  Patient personal goal between sessions to continue to implement boundaries were needed, and focus on her self-care and her health concerns.  Progress note was dictated with Dragon and reviewed for accuracy.  Gaspar Bidding, [facility]

## 2023-08-01 ENCOUNTER — Ambulatory Visit: Payer: Medicare Other | Attending: Cardiovascular Disease

## 2023-08-01 ENCOUNTER — Ambulatory Visit: Payer: Self-pay | Admitting: Adult Health

## 2023-08-01 DIAGNOSIS — Z7901 Long term (current) use of anticoagulants: Secondary | ICD-10-CM | POA: Insufficient documentation

## 2023-08-01 DIAGNOSIS — Z952 Presence of prosthetic heart valve: Secondary | ICD-10-CM | POA: Diagnosis not present

## 2023-08-01 LAB — POCT INR: INR: 3.1 — AB (ref 2.0–3.0)

## 2023-08-01 NOTE — Telephone Encounter (Addendum)
 Copied from CRM [id number]. Topic: Clinical - Red Word Triage >> Aug 01, 2023  2:13 PM Alona Bene A wrote: Red Word that prompted transfer to Nurse Triage: Diarrhea   Chief Complaint: Cough Symptoms: Cough, sneeze, weakness, diahrrhea Frequency: Ongoing for one week Disposition: [x] ED /[] Urgent Care (no appt availability in office) / [] Appointment(In office/virtual)/ []  [place] [place] [facility]/ [] Home Care/ [] Refused Recommended Disposition /[] [place] [facility]/ []  Follow-up with PCP Additional Notes: Patient stated she having been having cough, sneezing, weakness, SOB with activity, and diarrhea for one week. She was vomiting last week and was unable to keep anything down for 3 days. Patient's last episode of vomiting was Friday. She is still having diarrhea. She stated she passed a blood clot in her stool and she also stated she is having black, coffee-ground appearing stools. Advised patient to be seen in ED. She refused because she can't afford it. Patient also declined to go to urgent care as well.   Clinic Access Line notified of refusal.   Reason for Disposition  Black or tarry bowel movements  (Exception: Chronic-unchanged black-grey BMs AND is taking iron pills or Pepto-Bismol.)  Answer Assessment - Initial Assessment Questions 1. ONSET: "When did the cough begin?"      One week ago  2. SEVERITY: "How bad is the cough today?"      Cough  3. SPUTUM: "Describe the color of your sputum" (none, dry cough; clear, white, yellow, green)     Yellow  4. DIFFICULTY BREATHING: "Are you having difficulty breathing?" If Yes, ask: "How bad is it?" (e.g., mild, moderate, severe)    - MILD: No SOB at rest, mild SOB with walking, speaks normally in sentences, can lie down, no retractions, pulse < 100.    - MODERATE: SOB at rest, SOB with minimal exertion and prefers to sit, cannot lie down flat, speaks in phrases, mild retractions, audible wheezing, pulse 100-120.    - SEVERE: Very SOB at rest,  speaks in single words, struggling to breathe, sitting hunched forward, retractions, pulse > 120      SOB when moving around, with activity  6. FEVER: "Do you have a fever?" If Yes, ask: "What is your temperature, how was it measured, and when did it start?"     Feeling hot  10. OTHER SYMPTOMS: "Do you have any other symptoms?" (e.g., runny nose, wheezing, chest pain) Last vomiting episode was Friday  Answer Assessment - Initial Assessment Questions 1. DIARRHEA SEVERITY: "How bad is the diarrhea?" "How many more stools have you had in the past 24 hours than normal?"    - NO DIARRHEA (SCALE 0)   - MILD (SCALE 1-3): Few loose or mushy BMs; increase of 1-3 stools over normal daily number of stools; mild increase in ostomy output.   -  MODERATE (SCALE 4-7): Increase of 4-6 stools daily over normal; moderate increase in ostomy output.   -  SEVERE (SCALE 8-10; OR "WORST POSSIBLE"): Increase of 7 or more stools daily over normal; moderate increase in ostomy output; incontinence.     4  2. ONSET: "When did the diarrhea begin?"      A week ago  3. BM CONSISTENCY: "How loose or watery is the diarrhea?"      Loose  4. VOMITING: "Are you also vomiting?" If Yes, ask: "How many times in the past 24 hours?"      Patient was vomiting last week and was unable to keep anything down for 3 days. Last episode of  vomiting was Friday.  5. ABDOMEN PAIN: "Are you having any abdomen pain?" If Yes, ask: "What does it feel like?" (e.g., crampy, dull, intermittent, constant)      No, but coughing hurts patient's ribs  6. HYDRATION: "Any signs of dehydration?" (e.g., dry mouth [not just dry lips], too weak to stand, dizziness, new weight loss) "When did you last urinate?"     Patient is weak but she is able to walk with a walker  7. OTHER SYMPTOMS: "Do you have any other symptoms?" (e.g., fever, blood in stool)       Loose stool, passed blood clot in stool, and there is some black which looks life coffee  grounds  Protocols used: Cough - Acute Productive-A-AH, Diarrhea-A-AH

## 2023-08-01 NOTE — Patient Instructions (Signed)
 Continue 0.5 tablet daily except for 1 tablet on Monday, Wednesday and Friday.   Repeat INR in 4 weeks. [facility] [phone]

## 2023-08-01 NOTE — Telephone Encounter (Signed)
 Message forwarded to [facility]

## 2023-08-02 DIAGNOSIS — H532 Diplopia: Secondary | ICD-10-CM | POA: Diagnosis not present

## 2023-08-02 DIAGNOSIS — H04123 Dry eye syndrome of bilateral lacrimal glands: Secondary | ICD-10-CM | POA: Diagnosis not present

## 2023-08-02 LAB — HM DIABETES EYE EXAM

## 2023-08-02 NOTE — Telephone Encounter (Signed)
Tried calling patient, LMOM to return call.

## 2023-08-02 NOTE — Telephone Encounter (Signed)
 Patient needs to make appointment in clinic ASAP if not ED.

## 2023-08-03 ENCOUNTER — Encounter: Payer: Self-pay | Admitting: Sports Medicine

## 2023-08-03 ENCOUNTER — Ambulatory Visit: Admitting: Sports Medicine

## 2023-08-03 VITALS — BP 100/50 | HR 63 | Temp 97.6°F | Resp 17 | Ht 62.0 in | Wt 177.4 lb

## 2023-08-03 DIAGNOSIS — R197 Diarrhea, unspecified: Secondary | ICD-10-CM | POA: Diagnosis not present

## 2023-08-03 DIAGNOSIS — R059 Cough, unspecified: Secondary | ICD-10-CM | POA: Diagnosis not present

## 2023-08-03 DIAGNOSIS — J029 Acute pharyngitis, unspecified: Secondary | ICD-10-CM

## 2023-08-03 DIAGNOSIS — B3789 Other sites of candidiasis: Secondary | ICD-10-CM

## 2023-08-03 DIAGNOSIS — H9201 Otalgia, right ear: Secondary | ICD-10-CM

## 2023-08-03 DIAGNOSIS — H04123 Dry eye syndrome of bilateral lacrimal glands: Secondary | ICD-10-CM | POA: Diagnosis not present

## 2023-08-03 DIAGNOSIS — R509 Fever, unspecified: Secondary | ICD-10-CM | POA: Diagnosis not present

## 2023-08-03 DIAGNOSIS — R112 Nausea with vomiting, unspecified: Secondary | ICD-10-CM

## 2023-08-03 LAB — POCT INFLUENZA A/B
Influenza A, POC: NEGATIVE
Influenza B, POC: NEGATIVE

## 2023-08-03 LAB — POC COVID19 BINAXNOW: SARS Coronavirus 2 Ag: NEGATIVE

## 2023-08-03 MED ORDER — BENZONATATE 200 MG PO CAPS: 200.0000 mg | ORAL_CAPSULE | Freq: Two times a day (BID) | ORAL | 0 refills | Status: DC | PRN

## 2023-08-03 MED ORDER — NYSTATIN 100000 UNIT/GM EX POWD: 1.0000 | Freq: Three times a day (TID) | CUTANEOUS | 0 refills | Status: DC

## 2023-08-03 MED ORDER — ONDANSETRON 4 MG PO TBDP: 4.0000 mg | ORAL_TABLET | Freq: Three times a day (TID) | ORAL | 0 refills | Status: DC | PRN

## 2023-08-03 MED ORDER — METAMUCIL 0.36 G PO CAPS: 1.0000 | ORAL_CAPSULE | Freq: Every day | ORAL | 0 refills | Status: DC

## 2023-08-03 MED ORDER — LORATADINE 10 MG PO TABS: 10.0000 mg | ORAL_TABLET | Freq: Every day | ORAL | 11 refills | Status: DC

## 2023-08-03 NOTE — Patient Instructions (Addendum)
 Take flonase, claritin daily  Take tessalon for cough twice daily  Use imodium as needed for diarrhea Take metamucil daily  Decrease metoprolol to 1 tab daily  Stop hydroxyzine Take zofran as needed for nausea/ vomiting

## 2023-08-03 NOTE — Progress Notes (Signed)
 Careteam: Patient Care Team: Medina-Vargas, Margit Banda, NP as PCP - General (Internal Medicine) O'Neal, Ronnald Ramp, MD as PCP - Cardiology (Cardiology) Lennette Bihari, MD (Cardiology) Sypher, Molly Maduro, MD (Inactive) (Orthopedic Surgery) Melvenia Needles, MD (Ophthalmology)  PLACE OF SERVICE:  [facility]  Advanced Directive information    Allergies  Allergen Reactions   Atorvastatin     Muscle pain   Dapagliflozin     Other reaction(s): nausea   Gabapentin     Other reaction(s): stomach issues   Simvastatin Other (See Comments)    Muscle pain   Sulfa Antibiotics     unknown   Iodinated Contrast Media Rash     Red rash after cardiac cath 1 wk ago, ? Contrast allergy, requires 13 hr prep now per [email or web address]      Chief Complaint  Patient presents with   Acute Visit    Patient complains of cough, weakness, fatigue, fever, chills, nasal congestion, sore throat, and body aches.      Discussed the use of AI scribe software for clinical note transcription with the patient, who gave verbal consent to proceed.  History of Present Illness     78 yr old F with h/o Dementia, HTN, CHF presented to clinic for acute visit for cough  For the past two weeks, she has experienced dizziness and lightheadedness, along with dry mouth, dry eyes. She feels unwell, with night sweats and chills, though she has not measured her temperature. She feels feverish at night.Her energy levels are low, and she feels fatigued, impacting her ability to work.  She describes a dry cough producing yellow phlegm, nasal congestion, right ear pain radiating to the back of her neck, and post-nasal drip that worsens when lying flat. She experiences a sore throat and pain when swallowing, even with water. She feels short of breath and can only walk a short distance before needing to stop.  She has experienced diarrhea approximately eight times over the past two weeks . She alternates between  constipation and diarrhea, with diarrhea occurring shortly after meals. Her appetite is reduced, and she feels bloated after eating. She has been vomiting and experiences stomach pain when eating, feeling as though she might vomit after meals. She reports heart palpitations and a racing heart when moving from bed to the bathroom.  She mentions a rash that started on her arms, legs, and hands and has spread to her torso. She has been diagnosed with granulosa and has seen a dermatologist who performed a biopsy. She suspects she might have Sjogren's syndrome, as she shares symptoms with a friend who has the condition, including dry eyes and mouth, and joint pain.  She has a history of high blood pressure and is currently taking metoprolol, which she was previously instructed to take three times a day. She also takes hydroxyzine for anxiety, which she believes contributes to her dizziness. She is on valsartan and a diuretic, which she feels may be causing dehydration and low blood pressure.          Review of Systems:  Review of Systems  Constitutional:  Negative for chills and fever.  HENT:  Positive for congestion, ear pain and sore throat. Negative for ear discharge and sinus pain.   Eyes:  Negative for double vision.  Respiratory:  Positive for cough and sputum production. Negative for shortness of breath.   Cardiovascular:  Negative for chest pain, palpitations and leg swelling.  Gastrointestinal:  Positive for constipation, diarrhea and nausea. Negative for  abdominal pain and heartburn.  Genitourinary:  Negative for dysuria, frequency and hematuria.  Musculoskeletal:  Negative for falls and myalgias.  Skin:  Positive for rash.  Neurological:  Positive for dizziness. Negative for sensory change and focal weakness.   Negative unless indicated in HPI.   Past Medical History:  Diagnosis Date   Anemia    Anxiety    Aortic stenosis    a. Now has St Jude mechanical aortic valve (6/00). b.  Echo (6/15) with EF 60-65%, mechanical aortic valve with mean gradient 27 mmHg.   Arthritis    a. Possible c-spine arthritis with pain down left arm.     Carotid artery disease (HCC)    a. Carotid dopplers (7/16) with 40-59% BICA stenosis.     Chronic angle-closure glaucoma(365.23)    Chronic diastolic CHF (congestive heart failure) (HCC)    a.  RHC (7/15) with mean RA 2, PA 22/11, mean PCWP 9, CI 3.79.   Contrast media allergy    Coronary atherosclerosis of native coronary artery    a. CABG at time of AVR in 6/00 with RIMA-RCA. b. Abnl nuc 11/2013 -> LHC (7/15) with patent RIMA-RCA, 80% mRCA, 80% pLAD with FFR 0.73, treated with DES to pLAD.   Depression    Essential hypertension    GERD (gastroesophageal reflux disease)    Hyperlipidemia    Low back pain    Neuromuscular disorder (HCC)    neuropathy   Peripheral vascular disease (HCC)    a, H/o Right SFA stent. b. Peripheral arterial dopplers (7/16) with right SFA stent patent.     PONV (postoperative nausea and vomiting)    N&V   Type II diabetes mellitus (HCC)    Past Surgical History:  Procedure Laterality Date   BILATERAL OOPHORECTOMY  05/24/1985   BREAST BIOPSY  05/24/2010   left   CARDIAC CATHETERIZATION  01/14/1999   normal LV function, severe aortic stenosis; 80% and 70% stenosis in RCA; mild 20% distal norrowing in L main with 20% proximal LAD stenosis, 40% diagonal stenosis and 20% proximal circumflex stenosis   CARDIAC VALVE REPLACEMENT  05/24/1998   aortic valve    CARDIOVASCULAR STRESS TEST  08/25/2011   R/L MV - EF 72%; no scintigraphic evidence of inducible MI; normal perfusionTID of 1.25 elevated - could indicate small vessle subendocardial ischemia; EKG NSR at 66, non diagnostic for ischemia   CARPAL TUNNEL RELEASE  05/25/1987   bilaterally   CATARACT EXTRACTION W/ INTRAOCULAR LENS  IMPLANT, BILATERAL  ~ 2007   CESAREAN SECTION  1969; 1971   CHOLECYSTECTOMY  05/24/1988   COLONOSCOPY N/A 10/27/2012    Procedure: COLONOSCOPY;  Surgeon: Theda Belfast, MD;  Location: [facility];  Service: Endoscopy;  Laterality: N/A;   CORONARY ARTERY BYPASS GRAFT  05/24/1998   RIMA to RCA   CORONARY/GRAFT ANGIOGRAPHY N/A 09/07/2021   Procedure: CORONARY/GRAFT ANGIOGRAPHY;  Surgeon: Orbie Pyo, MD;  Location: [facility];  Service: Cardiovascular;  Laterality: N/A;   DOPPLER ECHOCARDIOGRAPHY  03/29/2012   EF >55%; mild concentric LVH; stage 1 diastolic dysfunction, elevated LV filling pressure, dilated LA; MAC mild MR; St Jude AVR peak and mean gradients of and ; transvalvular gradients have increased (prev 23 and 14 respectively)   ESOPHAGOGASTRODUODENOSCOPY N/A 10/27/2012   Procedure: ESOPHAGOGASTRODUODENOSCOPY (EGD);  Surgeon: Theda Belfast, MD;  Location: [facility];  Service: Endoscopy;  Laterality: N/A;   FEMORAL ARTERY STENT  09/20/2011   6 x 40 Smart Nitinol self-expanding stent placed;  10/15/2031 -R SFA stent open and patent w/o evidence of restenosis   FRACTIONAL FLOW RESERVE WIRE  12/14/2013   Procedure: FRACTIONAL FLOW RESERVE WIRE;  Surgeon: Laurey Morale, MD;  Location: [facility] C[facility] LAB;  Service: Cardiovascular;;   KNEE SURGERY Left    LACERATION REPAIR     right hand   LEFT AND RIGHT HEART CATHETERIZATION WITH CORONARY ANGIOGRAM N/A 12/14/2013   Procedure: LEFT AND RIGHT HEART CATHETERIZATION WITH CORONARY ANGIOGRAM;  Surgeon: Laurey Morale, MD;  Location: [facility] CATH LAB;  Service: Cardiovascular;  Laterality: N/A;   LOWER EXTREMITY ANGIOGRAM N/A 09/20/2011   Procedure: LOWER EXTREMITY ANGIOGRAM;  Surgeon: Runell Gess, MD;  Location: [facility] CATH LAB;  Service: Cardiovascular;  Laterality: N/A;   LYMPH NODE BIOPSY  06/25/2011   "core needle on 5"   needle biopsy  05/25/2007   "on ankles for nerve damage"   NEUROPLASTY / TRANSPOSITION ULNAR NERVE AT ELBOW     right   ORIF FEMUR FRACTURE Left 09/09/2021   Procedure: OPEN REDUCTION INTERNAL FIXATION (ORIF) DISTAL  FEMUR FRACTURE;  Surgeon: Roby Lofts, MD;  Location: [facility] OR;  Service: Orthopedics;  Laterality: Left;   PERCUTANEOUS CORONARY STENT INTERVENTION (PCI-S)  12/14/2013   Procedure: PERCUTANEOUS CORONARY STENT INTERVENTION (PCI-S);  Surgeon: Laurey Morale, MD;  Location: [facility] CATH LAB;  Service: Cardiovascular;;  Prox LAD 3.00x12 Promus DES    PERIPHERAL ARTERIAL STENT GRAFT  09/20/2011   right SFA   REFRACTIVE SURGERY  ` 2004   "for glaucoma"   TONSILLECTOMY  05/24/1966   TRIGGER FINGER RELEASE Left 12/04/2015   Procedure: LEFT RING FINGER TRIGGER RELEASE;  Surgeon: Betha Loa, MD;  Location: [facility];  Service: Orthopedics;  Laterality: Left;   VAGINAL HYSTERECTOMY  05/25/1983   Fibroids   Social History:   reports that she has never smoked. She has never used smokeless tobacco. She reports that she does not currently use alcohol. She reports that she does not currently use drugs.  Family History  Problem Relation Age of Onset   Colon cancer Mother    COPD Mother    Emphysema Mother    Heart disease Brother    Cancer Maternal Grandfather    Cancer Maternal Grandmother    Drug abuse Daughter    Crohn's disease Son    Diabetes Neg Hx     Medications: Patient's Medications  New Prescriptions   BENZONATATE (TESSALON) 200 MG CAPSULE    Take 1 capsule (200 mg total) by mouth 2 (two) times daily as needed for cough.   LORATADINE (CLARITIN) 10 MG TABLET    Take 1 tablet (10 mg total) by mouth daily.   NYSTATIN (MYCOSTATIN/NYSTOP) POWDER    Apply 1 Application topically 3 (three) times daily.   ONDANSETRON (ZOFRAN-ODT) 4 MG DISINTEGRATING TABLET    Take 1 tablet (4 mg total) by mouth every 8 (eight) hours as needed for nausea or vomiting.   PSYLLIUM (METAMUCIL) 0.36 G CAPS    Take 1 tablet by mouth daily.  Previous Medications   ASCORBIC ACID (VITAMIN C) 1000 MG TABLET    Take 1,000 mg by mouth every evening.   ASPIRIN EC 81 MG TABLET    Take 81 mg by mouth every  evening.   CYANOCOBALAMIN (VITAMIN B12) 1000 MCG TABLET    Take 1,000 mcg by mouth daily.   FLUTICASONE (FLONASE) 50 MCG/ACT NASAL SPRAY    Place 2 sprays into both nostrils as needed for allergies or rhinitis.  FOLIC ACID (FOLVITE) 800 MCG TABLET    Take 800 mcg by mouth daily.   LATANOPROST (XALATAN) 0.005 % OPHTHALMIC SOLUTION    PLACE 1 DROP INTO BOTH EYES AT BEDTIME.   MELATONIN 5 MG TABS    Take 1 tablet (5 mg total) by mouth at bedtime.   METOPROLOL SUCCINATE (TOPROL XL) 50 MG 24 HR TABLET    Take 1 tablet (50 mg total) by mouth daily. Take with or immediately following a meal.   MULTIPLE VITAMIN (MULTIVITAMIN WITH MINERALS) TABS TABLET    Take 2 tablets by mouth daily.   NITROGLYCERIN (NITROSTAT) 0.4 MG SL TABLET    Place 1 tablet (0.4 mg total) under the tongue every 5 (five) minutes as needed for chest pain.   PANTOPRAZOLE (PROTONIX) 40 MG TABLET    Take 1 tablet (40 mg total) by mouth daily.   POTASSIUM CHLORIDE SA (KLOR-CON M20) 20 MEQ TABLET    Take 1 tablet (20 mEq total) by mouth daily.   ROSUVASTATIN (CRESTOR) 20 MG TABLET    Take 1 tablet (20 mg total) by mouth every evening.   SERTRALINE (ZOLOFT) 100 MG TABLET    Take 1 tablet (100 mg total) by mouth daily.   TORSEMIDE (DEMADEX) 20 MG TABLET    Take 1 tablet (20 mg total) by mouth daily. increase her Torsemide to 20mg  twice daily for 3 days THEN BACK TO ONCE DAILY   TRAZODONE (DESYREL) 100 MG TABLET    Take 0.5-2 tablets (50-200 mg total) by mouth at bedtime as needed for sleep.   VALSARTAN (DIOVAN) 40 MG TABLET    Take 1 tablet (40 mg total) by mouth daily.   WARFARIN (COUMADIN) 3 MG TABLET    TAKE 1/2 TO 1 TABLET BY MOUTH DAILY AS DIRECTED  Modified Medications   No medications on file  Discontinued Medications   HYDROXYZINE (VISTARIL) 25 MG CAPSULE    Take 1 capsule (25 mg total) by mouth at bedtime as needed.    Physical Exam: Vitals:   08/03/23 1452  BP: (!) 100/50  Pulse: 63  Resp: 17  Temp: 97.6 F (36.4 C)   SpO2: 96%  Weight: 177 lb 6.4 oz (80.5 kg)  Height: 5\' 2"  (1.575 m)   Body mass index is 32.45 kg/m. BP Readings from Last 3 Encounters:  08/03/23 (!) 100/50  07/15/23 (!) 110/42  07/05/23 124/78   Wt Readings from Last 3 Encounters:  08/03/23 177 lb 6.4 oz (80.5 kg)  07/15/23 177 lb 12.8 oz (80.6 kg)  07/05/23 179 lb 3.2 oz (81.3 kg)    Physical Exam Constitutional:      Appearance: Normal appearance.  HENT:     Head: Normocephalic and atraumatic.     Right Ear: There is no impacted cerumen.     Left Ear: There is no impacted cerumen.     Mouth/Throat:     Pharynx: Posterior oropharyngeal erythema present. No oropharyngeal exudate.  Cardiovascular:     Rate and Rhythm: Normal rate and regular rhythm.  Pulmonary:     Effort: Pulmonary effort is normal. No respiratory distress.     Breath sounds: Normal breath sounds. No wheezing.  Abdominal:     General: Bowel sounds are normal. There is no distension.     Tenderness: There is no abdominal tenderness. There is no guarding or rebound.     Comments:    Musculoskeletal:        General: No swelling or tenderness.  Neurological:  Mental Status: She is alert. Mental status is at baseline.     Sensory: No sensory deficit.     Motor: No weakness.    Labs reviewed: Basic Metabolic Panel: Recent Labs    06/24/23 1331 07/05/23 1529 07/15/23 1539  NA 138 137 138  K 3.9 4.5 4.3  CL 101 98 102  CO2 26 28 26   GLUCOSE 241* 173* 234*  BUN 35* 66* 48*  CREATININE 1.49* 2.08* 1.71*  CALCIUM 9.4 10.1 9.4   Liver Function Tests: Recent Labs    01/10/23 0000 03/30/23 1805  AST 20 38  ALT 11 27  ALKPHOS 80 64  BILITOT  --  0.9  PROT  --  7.2  ALBUMIN 4.6 4.0   Recent Labs    03/30/23 1805 03/31/23 0441  LIPASE 54* 42   No results for input(s): "AMMONIA" in the last 8760 hours. CBC: Recent Labs    01/10/23 0000 02/16/23 0936 03/30/23 1805 03/31/23 0441 04/01/23 0346 06/24/23 1201  WBC 6.2 4.8   < >  6.9 5.1 5.3  NEUTROABS 3.50 2,818  --   --   --  3,286  HGB 10.5* 9.8*   < > 9.6* 8.9* 10.2*  HCT 32* 31.6*   < > 30.4* 28.9* 32.3*  MCV  --  88.8   < > 90.5 87.3 87.8  PLT 128* 113*   < > 103* 84* 114*   < > = values in this interval not displayed.   Lipid Panel: Recent Labs    02/16/23 0936 05/26/23 1041  CHOL 167 145  HDL 46* 41*  LDLCALC 83 71  TRIG 286* 253*  CHOLHDL 3.6 3.5   TSH: No results for input(s): "TSH" in the last 8760 hours. A1C: Lab Results  Component Value Date   HGBA1C 7.1 (H) 05/26/2023    Assessment and Plan 1. Cough, unspecified type (Primary) 2. Fever and chills . Sore throat  Lungs clear  Afebrile  Flu/ covid neg - benzonatate (TESSALON) 200 MG capsule; Take 1 capsule (200 mg total) by mouth 2 (two) times daily as needed for cough.  Dispense: 20 capsule; Refill: 0 Instructed patient to take claritin    4. Diarrhea, unspecified type Intermittent diarrhea   alternating with constipation, suggesting irritable bowel pattern.   - Advise use of Metamucil for stool regulation. - Encourage increased water intake as tolerated.  Take imodium as needed  5. Nausea and vomiting, unspecified vomiting type  - ondansetron (ZOFRAN-ODT) 4 MG disintegrating tablet; Take 1 tablet (4 mg total) by mouth every 8 (eight) hours as needed for nausea or vomiting.  Dispense: 20 tablet; Refill: 0  6. Right ear pain No redness, bulging or swelling of tympanic membrane  7. Candida rash of groin  nystatin (MYCOSTATIN/NYSTOP) powder; Apply 1 Application topically 3 (three) times daily.  Dispense: 15 g; Refill: 0  8. Dry eye syndrome of both eyes  - Sjogren's syndrome antibods(ssa + ssb)       Hypertension Blood pressure low, possibly due to overmedication with antihypertensives. Reducing metoprolol expected to stabilize blood pressure. - Reduce metoprolol to one tablet daily. - Monitor blood pressure regularly.

## 2023-08-03 NOTE — Telephone Encounter (Signed)
 Pt scheduled with Dr.Veludandi today.

## 2023-08-04 ENCOUNTER — Telehealth: Payer: Self-pay

## 2023-08-04 NOTE — Telephone Encounter (Signed)
 Patient identification verified by 2 forms.  Called and spoke to patient  Patient states: She took off the monitor "at the end of March" informed pt we are still in March. After laughing she states "I guess I put the monitor on before that. But I did wear it for 30 days."              Interventions/Plan: Pt given number for [employer] to call. She states "I'll call them as soon as I get off the phone with you."   No questions at this time.

## 2023-08-05 LAB — SJOGREN'S SYNDROME ANTIBODS(SSA + SSB)
SSA (Ro) (ENA) Antibody, IgG: <1 AI
SSB (La) (ENA) Antibody, IgG: <1 AI

## 2023-08-08 ENCOUNTER — Ambulatory Visit: Payer: Medicare Other | Admitting: Professional Counselor

## 2023-08-12 ENCOUNTER — Ambulatory Visit: Payer: Medicare Other | Admitting: Adult Health

## 2023-08-12 ENCOUNTER — Ambulatory Visit: Attending: Student

## 2023-08-12 DIAGNOSIS — R296 Repeated falls: Secondary | ICD-10-CM

## 2023-08-16 NOTE — Progress Notes (Signed)
 Cardiology Office Note:    Date:  08/29/2023   ID:  Abigail Oliver, DOB 1945/11/06, MRN [id number]  PCP:  [name], NP  Cardiologist:  Reatha Harps, MD     Referring MD: [name]*   Chief Complaint: dyspnea on exertion  History of Present Illness:    Abigail Oliver is a 78 y.o. female with a history of CAD s/p remote CABG x1 (RIMA to PDA) in 05/1998 with subsequent PCI to LAD in 2015, chronic HFpEF, palpitations with no significant arrhythmias on recent monitor in 06/2023, aortic stenosis s/p mechanical AVR in 05/1998 at time of CABG on Coumadin, PAD s/p right SFA stenting in 2013, carotid stenosis, hypertension, hyperlipidemia, type 2 diabetes mellitus, CKD stage IIIb, and GERD who is followed by Dr. Flora Lipps and Dr. Allyson Sabal and presents today for follow-up of dyspnea on exertion.   Last ischemic evaluation was a cardiac catheterization in 08/2021 which showed patent RIMA to PDA and patent proximal LAD stent as well as 40% stenosis of distal left main and ostial LAD and 50% mid LAD disease. Continued medical therapy was recommended. Last Echo in 08/2022 showed LVEF of 60-65% with normal wall motion and mild LVH, mildly reduced RV function, severe left atrial enlargement, normal structure and function of mechanical AVR, and mild MR. Last lower extremity arterial dopplers in 12/2022 showed patent right SFA stenting with 50-74% stenosis in the right SFA  and/ or popliteal artery but prior stent patent. ABIs/ TBIs were normal bilaterally.   She was admitted in 03/2023 for left sided chest pain. Work-up was unremarkable. High-sensitivity troponin was negative x4. She was seen by Cardiology and pain was felt to be very atypical (likely musculoskeletal in nature). No additional cardiac work-up was felt to be necessary.   She was last seen by me in 06/2023 at which time she reported new dyspnea on exertion over the last couple of weeks but no recurrent chest pain. She also  reported a couple of falls over the last few years with the most recent one being one week prior to that visit. It occurred after bending over and then standing up. BNP was elevated in the 500s. Torsemide was increased for 3 days. Echo was ordered and showed LVEF of 60-65% with normal wall motion, moderate LVH, and grade 1 diastolic dysfunction, normal RV function, and stable aortic valve. 30 day Event Monitor was ordered to rule out arrhythmia as a cause of the falls and showed rare ectopy but no significant arrhythmias. Carotid dopplers were also ordered for routine monitoring of carotid artery disease and showed 40-59% stenosis of right ICA and 1-39% stenosis of left ICA as well as disturbed flow in bilateral subclavian arteries.   Patient presents today for follow-up. Here with her husband.  Overall, she is doing much better than last visit.  She does report she had has had a couple of bad days recently with increased dizziness.  However, this correlated with decreased PO intake.  She is now eating/drinking more and dizziness has resolved.  She she states she has more on steadiness on her feet than true dizziness which she attributes to her prior hip/knee issues in her left leg which limit her ability to put weight on this leg.  She denies any falls since last visit.  She denies any chest pain.  She states her shortness of breath resolved after increasing her torsemide for a few days.  She denies any orthopnea or PND.  She works  as a hairdresser and states she will have some edema when she is on her feet all day but this resolves with elevating her legs overnight.  She also reports palpitations a couple times a week.  She is unable to tell me how long these last for but she states when they come on she has to sit down until he passed.  We reviewed recent monitor results which did not show any significant arrhythmias.  She denies any syncope.  She did report some nosebleeds with significant clots over the last  few days however, this seems to have resolved.  She has not had any nosebleeds today.  Her INR this morning was elevated at 5.0 and she has been instructed by her Coumadin clinic to hold her Coumadin for 2 days.   EKGs/Labs/Other Studies Reviewed:    The following studies were reviewed:  Cardiac Catheterization 09/07/2021:   Prox LAD lesion is 30% stenosed.   Mid LAD lesion is 50% stenosed.   Dist LM to Ost LAD lesion is 40% stenosed.   Dist RCA lesion is 100% stenosed.   1.  Patent proximal LAD stent. 2.  Patent RIMA to PDA. 3.  Moderate distal left main and mid LAD disease. 4.  Left heart catheterization was deferred due to presence of mechanical aortic valve replacement.   Recommendations: Results reviewed with Dr. Eldridge Dace.  No intervention was pursued and the patient will be referred for urgent orthopedic surgery.   Diagnostic Dominance: Right    _______________   Lower Extremity Arterial Ultrasound / ABIs 12/31/2022: Summary: Right: 50-74% stenosis noted in the superficial femoral artery. 50-74%  stenosis noted in the superficial femoral artery and/or popliteal artery.  Patent right SFA stent.    ABI Summary: Right: Resting right ankle-brachial index is within normal range. The  right toe-brachial index is normal.   Left: Resting left ankle-brachial index is within normal range. The left  toe-brachial index is normal. _______________  Carotid Dopplers 07/18/2023: Summary:  - Right Carotid: Velocities in the right ICA are consistent with a 40-59% stenosis. The ECA appears >50% stenosed.  - Left Carotid: Velocities in the left ICA are consistent with a 1-39% stenosis.  - Vertebrals: Bilateral vertebral arteries demonstrate antegrade flow.  - Subclavians: Bilateral subclavian artery flow was disturbed.  _______________  Echocardiogram 07/21/2023: Impressions: 1. Left ventricular ejection fraction, by estimation, is 60 to 65%. The  left ventricle has normal function.  The left ventricle has no regional  wall motion abnormalities. There is moderate concentric left ventricular  hypertrophy. Left ventricular  diastolic parameters are consistent with Grade I diastolic dysfunction  (impaired relaxation). Elevated left ventricular end-diastolic pressure.   2. Right ventricular systolic function is normal. The right ventricular  size is normal. There is normal pulmonary artery systolic pressure.   3. The mitral valve is normal in structure. Trivial mitral valve  regurgitation. No evidence of mitral stenosis.   4. The aortic valve has been repaired/replaced. Aortic valve  regurgitation is not visualized. No aortic stenosis is present. Aortic  valve area, by VTI measures 1.15 cm. Aortic valve mean gradient measures  14.0 mmHg. Aortic valve Vmax measures 2.62 m/s.   5. The inferior vena cava is normal in size with greater than 50%  respiratory variability, suggesting right atrial pressure of 3 mmHg.  _______________  30-Day Event Monitor 07/07/2023 to 08/10/2023: Minimum heart rate 50 bpm (sinus bradycardia). Maximum heart rate 114 bpm (sinus tachycardia). Average heart rate 65 bpm (normal sinus rhythm). 1 episode  of non-sustained ventricular tachycardia was detected (4 beats; 2 second duration). Ventricular ectopy was rare (<1%). Supraventricular ectopy was rare (<1%).    Impression: No significant arrhythmias detected.  Rare ectopy.   EKG:  EKG not ordered today.  Recent Labs: 03/30/2023: ALT 27 06/24/2023: Hemoglobin 10.2; Platelets 114 06/28/2023: BNP 500.3 07/15/2023: BUN 48; Creat 1.71; Potassium 4.3; Sodium 138  Recent Lipid Panel    Component Value Date/Time   CHOL 145 05/26/2023 1041   CHOL 144 01/30/2021 0857   TRIG 253 (H) 05/26/2023 1041   HDL 41 (L) 05/26/2023 1041   HDL 43 01/30/2021 0857   CHOLHDL 3.5 05/26/2023 1041   VLDL 44 (H) 01/17/2019 1038   LDLCALC 71 05/26/2023 1041   LDLDIRECT 138.0 10/02/2014 0958    Physical Exam:    Vital  Signs: BP (!) 122/46   Pulse 65   Ht 5\' 2"  (1.575 m)   Wt 177 lb (80.3 kg)   SpO2 97%   BMI 32.37 kg/m     Wt Readings from Last 3 Encounters:  08/29/23 177 lb (80.3 kg)  08/03/23 177 lb 6.4 oz (80.5 kg)  07/15/23 177 lb 12.8 oz (80.6 kg)     General: 78 y.o. Caucasian female in no acute distress. HEENT: Normocephalic and atraumatic. Sclera clear.  Neck: Supple. Soft carotid bruit on the right. No JVD. Heart:  RRR. Mechanical click present. No significant murmur noted.  Lungs: No increased work of breathing. Clear to ausculation bilaterally. No wheezes, rhonchi, or rales.  Extremities: No lower extremity edema.   Skin: Warm and dry. Neuro: No focal deficits. Psych: Normal affect. Responds appropriately.  Assessment:    1. Coronary artery disease involving native coronary artery of native heart without angina pectoris   2. Chronic diastolic heart failure (HCC)   3. Palpitations   4. Aortic valve stenosis s/p mechanical AVR   5. Mild mitral regurgitation   6. PAD (peripheral artery disease) (HCC)   7. Bilateral carotid artery stenosis   8. Primary hypertension   9. Hyperlipidemia, unspecified hyperlipidemia type   10. Type 2 diabetes mellitus with obesity (HCC)   11. Stage 3b chronic kidney disease (HCC)   12. Personal history of fall     Plan:    CAD S/p CABG with RIMA to PDA in 2000 and subsequent PCI to LAD in 2015. Last LHC in 06/2021 showed patent RIMA to PDA and patent proximal LAD stent as well as 40% stenosis of distal left main and ostial LAD and 50% mid LAD disease.  - No chest pain.  - Continue aspirin and statin.    Chronic HFpEF Patient reported new dyspnea on exertion on last visit in 06/2023. BNP was elevated in the 500s. Echo showed LVEF of 60-65% with normal wall motion, moderate LVH, and grade 1 diastolic dysfunction as well as normal RV function. Torsemide was increased for a few days. - Doing well. Dyspnea resolved after increase in dyspnea for a few  days. - Continue Torsemide 20mg  daily.  - Continue daily weights and sodium/ fluid restrictions.   Palpitations Recent monitor in 06/2023 showed one 4 beat run of NSVT and rare PACs/ PVCs but no significant arrhythmias.  - She reports palpitations a couple of times per week. - Continue Toprol-XL 50mg  daily. Hesitant to increase this due to soft diastolic BP, resting heart rate in the 60s, and unsteadiness. - Recommended [employer] device for longer episodes of palpitations.    Aortic Stenosis s/p Mechanical AVR Mild Mitral Regurgitation  S/p mechanical AVR in 2000 at time of CABG. Last Echo in 06/2023 showed stable aortic valve and only trivial MR (previously mild MR on Echo in 2024).   PAD S/p stenting to right SFA in 2013. Last lower extremity arterial dopplers in 12/2022 showed patent right SFA stenting with 50-74% stenosis in the right SFA  and/ or popliteal artery but prior stent patent. ABIs/ TBIs were normal bilaterally.  - Continue aspirin and statin. - Followed by Dr. Allyson Sabal. She has not seen him since 2023 so will arrange follow-up.    Carotid Stenosis Recent carotid dopplers in 06/2023 showed 40-59% stenosis of right ICA and 1-39% stenosis of left ICA as well as disturbed flow in bilateral subclavian arteries.  - Continue aspirin and statin. - Will plan to repeat carotid dopplers in 06/2024. Can order at follow-up visit.   Hypertension BP 122/46 today in office. - Continue current medications: Toprol-XL 50mg  daily and Valsartan 80mg  daily.    Hyperlipidemia Recent lipid panel in 05/2023: Total Cholesterol 145, Triglycerides 253, HDL 41, LDL 71. LDL goal <55. - Crestor was increased to 20mg  daily at last visit. Continue. - She states she recently had lipid panel rechecked at PCP's office. Will request a copy of this.   Type 2 Diabetes Mellitus Hemoglobin A1c 7.1% in 05/2023. - Management per PCP.   CKD Stage IIIb Recent baseline creatinine around 1.3 to 1.6. Stable at 1.49  in 06/24/2023.   History of Falls At last visit in 06/2023, patient patient reported a couple of falls over the last few years. She was somewhat of a difficult so 30 day monitor was ordered and showed no significant arrhythmias.  - She does describe some unsteadiness on her feet which she attributes to hip/ knee issues in her left leg. However, no recurrent falls since last visit.  - Recommended following up with PCP if unsteadiness worsens or does not improve.   Disposition: Follow up in 3 months.   Signed, Corrin Parker, PA-C  08/29/2023 12:23 PM    [facility]

## 2023-08-18 ENCOUNTER — Ambulatory Visit (INDEPENDENT_AMBULATORY_CARE_PROVIDER_SITE_OTHER): Admitting: Professional Counselor

## 2023-08-18 ENCOUNTER — Encounter: Payer: Self-pay | Admitting: Professional Counselor

## 2023-08-18 DIAGNOSIS — F411 Generalized anxiety disorder: Secondary | ICD-10-CM | POA: Diagnosis not present

## 2023-08-18 DIAGNOSIS — F331 Major depressive disorder, recurrent, moderate: Secondary | ICD-10-CM | POA: Diagnosis not present

## 2023-08-18 NOTE — Progress Notes (Signed)
      [facility] Counselor/Therapist Progress Note  Patient ID: Abigail Oliver, MRN: [id number],    Date: 08/18/2023  Time Spent: 2:03 PM to 3:04 PM  Treatment Type: Individual Therapy  Reported Symptoms: Low mood, tearfulness, worries, sadness, anhedonia, grief/loss, somatic pain (assorted concerns), stress, interpersonal concerns  Mental Status Exam:  Appearance:   Neat     Behavior:  Appropriate and Sharing  Motor:  Normal  Speech/Language:   Clear and Coherent and Normal Rate  Affect:  Depressed and Tearful  Mood:  depressed and sad  Thought process:  normal  Thought content:    WNL  Sensory/Perceptual disturbances:    WNL  Orientation:  oriented to person, place, time/date, and situation  Attention:  Good  Concentration:  Good  Memory:  WNL  Fund of knowledge:   Good  Insight:    Good  Judgment:   Good  Impulse Control:  Good   Risk Assessment: Danger to Self:  No Self-injurious Behavior: No Danger to Others: No Duty to Warn:no Physical Aggression / Violence:No  Access to Firearms a concern: No  Gang Involvement:No   Subjective: Patient presented to session to address concerns of anxiety and depression.  She reported minimal progress at this time.  She reported to have been feeling very depressed recently.  She shared that she had been working very hard to implement necessary boundaries with family member of concern who is going through significant crisis at this time.  Patient identified her limitations and support due to her own health and wellbeing.  Counselor actively listened and affirmed patient realistic understanding of herself and her needs and to be proactive in self protection.  Patient processed unfolding events and their impact on herself and family.  She processed related grief and loss.  Patient reported having been to her cardiologist and have had a hopeful EKG result, however to be continuing to struggle with a rash, mobility and pain issues.  She  processed experience of worry for her health and that of her husband's which is declining.  Counselor held space for patient processing, helps to instill's hope and resource patient strengths including resiliency, and helped patient develop strategies around her varying concerns.  Interventions: Assertiveness/Communication, Humanistic/Existential, and Grief Therapy  Diagnosis:   ICD-10-CM   1. Major depressive disorder, recurrent episode, moderate (HCC)  F33.1     2. Generalized anxiety disorder  F41.1       Plan: Patient is scheduled for follow-up; continue process work and developing coping skills.  Patient short-term goal between sessions to continue to adhere to her implementation of interpersonal boundaries in service of self preservation and wellbeing.  Patient to also continue to receive medical care and prioritize her health.  Progress note was dictated with Dragon and reviewed for accuracy.  Anthon Kins, [facility]

## 2023-08-19 DIAGNOSIS — N1832 Chronic kidney disease, stage 3b: Secondary | ICD-10-CM | POA: Diagnosis not present

## 2023-08-19 DIAGNOSIS — E1122 Type 2 diabetes mellitus with diabetic chronic kidney disease: Secondary | ICD-10-CM | POA: Diagnosis not present

## 2023-08-19 DIAGNOSIS — F419 Anxiety disorder, unspecified: Secondary | ICD-10-CM | POA: Diagnosis not present

## 2023-08-19 DIAGNOSIS — I129 Hypertensive chronic kidney disease with stage 1 through stage 4 chronic kidney disease, or unspecified chronic kidney disease: Secondary | ICD-10-CM | POA: Diagnosis not present

## 2023-08-19 DIAGNOSIS — I5030 Unspecified diastolic (congestive) heart failure: Secondary | ICD-10-CM | POA: Diagnosis not present

## 2023-08-19 DIAGNOSIS — E785 Hyperlipidemia, unspecified: Secondary | ICD-10-CM | POA: Diagnosis not present

## 2023-08-19 DIAGNOSIS — F32A Depression, unspecified: Secondary | ICD-10-CM | POA: Diagnosis not present

## 2023-08-19 DIAGNOSIS — I10 Essential (primary) hypertension: Secondary | ICD-10-CM | POA: Diagnosis not present

## 2023-08-24 ENCOUNTER — Encounter: Payer: Self-pay | Admitting: Professional Counselor

## 2023-08-24 ENCOUNTER — Ambulatory Visit (INDEPENDENT_AMBULATORY_CARE_PROVIDER_SITE_OTHER): Admitting: Professional Counselor

## 2023-08-24 DIAGNOSIS — F411 Generalized anxiety disorder: Secondary | ICD-10-CM | POA: Diagnosis not present

## 2023-08-24 DIAGNOSIS — F331 Major depressive disorder, recurrent, moderate: Secondary | ICD-10-CM

## 2023-08-24 NOTE — Progress Notes (Signed)
      [facility] Counselor/Therapist Progress Note  Patient ID: Abigail Oliver, MRN: [id number],    Date: 08/24/2023  Time Spent: 10:14 AM to 11:12 AM  Treatment Type: Individual Therapy  Reported Symptoms: Sadness, worries, stress, restlessness, fatigue, interpersonal concerns, grief/loss  Mental Status Exam:  Appearance:   Neat     Behavior:  Appropriate, Sharing, and Motivated  Motor:  Normal  Speech/Language:   Clear and Coherent and Normal Rate  Affect:  Appropriate and Congruent  Mood:  normal  Thought process:  normal  Thought content:    WNL  Sensory/Perceptual disturbances:    WNL  Orientation:  oriented to person, place, time/date, and situation  Attention:  Good  Concentration:  Good  Memory:  WNL  Fund of knowledge:   Good  Insight:    Good  Judgment:   Good  Impulse Control:  Good   Risk Assessment: Danger to Self:  No Self-injurious Behavior: No Danger to Others: No Duty to Warn:no Physical Aggression / Violence:No  Access to Firearms a concern: No  Gang Involvement:No   Subjective: Patient presented to session to address concerns of anxiety and depression.  She reported minimal progress at this time.  Patient reported continuing to experience stress due to daughters challenging legal circumstances at this time; she processed experience of frustration related to trying to be of help without enabling and having her efforts thwarted.  Patient reflected on her efforts of resourcing hope in believing that the likely outcome will be for the best and ultimately be of help, and spite of significant grief and loss.  Patient voiced increase in being able to face reality of situation and continuing to protect her and her husband's needs in the situation.  Counselor actively listened, affirmed patient feelings and experience, helped to reinforce patient assertiveness skills and boundary implementation, and discussed strategies with patient.  Interventions:  Solution-Oriented/Positive Psychology, Humanistic/Existential, Grief Therapy, and Insight-Oriented  Diagnosis:   ICD-10-CM   1. Generalized anxiety disorder  F41.1     2. Major depressive disorder, recurrent episode, moderate (HCC)  F33.1       Plan: Patient is scheduled for follow-up; continue process work and developing coping skills.  Patient short-term personal goal between sessions to continue to self advocate and assert her needs, implement boundaries were needed, and prioritize self-care.  Progress note was dictated with Dragon and reviewed for accuracy.  Anthon Kins, [facility]

## 2023-08-26 ENCOUNTER — Telehealth: Payer: Self-pay | Admitting: Cardiovascular Disease

## 2023-08-26 NOTE — Telephone Encounter (Signed)
 Patient has has nose bleed for two days. Calling to see if she should wait till the bleeding stop before taking her coumadin. Please advise

## 2023-08-26 NOTE — Telephone Encounter (Signed)
 Returned call and spoke with pt. Pt states the nose bleeds are not constant. It just periodically will drip blood. Pt denies any other signs of symptoms of bleeding. I advised pt if bleeding worsens or she is unable to stop the bleeding to seek immediate medical attention. I also advised her to try saline nasal spray to help with dryness. Pt has scheduled appt with coumadin clinic on Monday to check INR. Pt verbalized understanding.

## 2023-08-29 ENCOUNTER — Ambulatory Visit: Payer: Medicare Other | Attending: Student | Admitting: Student

## 2023-08-29 ENCOUNTER — Encounter: Payer: Self-pay | Admitting: Student

## 2023-08-29 ENCOUNTER — Ambulatory Visit (INDEPENDENT_AMBULATORY_CARE_PROVIDER_SITE_OTHER)

## 2023-08-29 VITALS — BP 122/46 | HR 65 | Ht 62.0 in | Wt 177.0 lb

## 2023-08-29 DIAGNOSIS — E785 Hyperlipidemia, unspecified: Secondary | ICD-10-CM | POA: Diagnosis not present

## 2023-08-29 DIAGNOSIS — N1832 Chronic kidney disease, stage 3b: Secondary | ICD-10-CM | POA: Diagnosis not present

## 2023-08-29 DIAGNOSIS — Z7901 Long term (current) use of anticoagulants: Secondary | ICD-10-CM | POA: Diagnosis not present

## 2023-08-29 DIAGNOSIS — I739 Peripheral vascular disease, unspecified: Secondary | ICD-10-CM | POA: Insufficient documentation

## 2023-08-29 DIAGNOSIS — R002 Palpitations: Secondary | ICD-10-CM | POA: Insufficient documentation

## 2023-08-29 DIAGNOSIS — I2510 Atherosclerotic heart disease of native coronary artery without angina pectoris: Secondary | ICD-10-CM | POA: Insufficient documentation

## 2023-08-29 DIAGNOSIS — I350 Nonrheumatic aortic (valve) stenosis: Secondary | ICD-10-CM | POA: Insufficient documentation

## 2023-08-29 DIAGNOSIS — I10 Essential (primary) hypertension: Secondary | ICD-10-CM | POA: Insufficient documentation

## 2023-08-29 DIAGNOSIS — I340 Nonrheumatic mitral (valve) insufficiency: Secondary | ICD-10-CM | POA: Insufficient documentation

## 2023-08-29 DIAGNOSIS — E669 Obesity, unspecified: Secondary | ICD-10-CM | POA: Insufficient documentation

## 2023-08-29 DIAGNOSIS — Z952 Presence of prosthetic heart valve: Secondary | ICD-10-CM | POA: Diagnosis not present

## 2023-08-29 DIAGNOSIS — Z9181 History of falling: Secondary | ICD-10-CM | POA: Insufficient documentation

## 2023-08-29 DIAGNOSIS — E1169 Type 2 diabetes mellitus with other specified complication: Secondary | ICD-10-CM | POA: Diagnosis not present

## 2023-08-29 DIAGNOSIS — I6523 Occlusion and stenosis of bilateral carotid arteries: Secondary | ICD-10-CM | POA: Insufficient documentation

## 2023-08-29 DIAGNOSIS — I5032 Chronic diastolic (congestive) heart failure: Secondary | ICD-10-CM | POA: Diagnosis not present

## 2023-08-29 LAB — POCT INR: INR: 5 — AB (ref 2.0–3.0)

## 2023-08-29 NOTE — Patient Instructions (Addendum)
 Medication Instructions:  The current medical regimen is effective;  continue present plan and medications as directed. Please refer to the Current Medication list given to you today.  *If you need a refill on your cardiac medications before your next appointment, please call your pharmacy*  Lab Work: WILL GET RECENT CHOLESTEROL LAB FROM PRIMARY CARE If you have labs (blood work) drawn today and your tests are completely normal, you will receive your results only by:  MyChart Message (if you have MyChart) OR A paper copy in the mail If you have any lab test that is abnormal or we need to change your treatment, we will call you to review the results.  Testing/Procedures: NONE  Follow-Up: At [facility], you and your health needs are our priority.  As part of our continuing mission to provide you with exceptional heart care, our providers are all part of one team.  This team includes your primary Cardiologist (physician) and Advanced Practice Providers or APPs (Physician Assistants and Nurse Practitioners) who all work together to provide you with the care you need, when you need it.  Your next appointment:   3 month(s)  SCHEDULE FOLLOW UP WITH DR San Morelle ARE OVERDUE  Provider:   Reatha Harps, MD     Other Instructions  Heart Failure Education: Weigh yourself EVERY morning after you go to the bathroom but before you eat or drink anything. Write this number down in a weight log/diary. If you gain 3 pounds overnight or 5 pounds in a week, call the office. Take your medicines as prescribed. If you have concerns about your medications, please call us before you stop taking them.  Eat low salt foods--Limit salt (sodium) to 2000 mg per day. This will help prevent your body from holding onto fluid. Read food labels as many processed foods have a lot of sodium, especially canned goods and prepackaged meats. If you would like some assistance choosing low sodium foods, we would be  happy to set you up with a nutritionist. Limit all fluids for the day to less than 2 liters (64 ounces). Fluid includes all drinks, coffee, juice, ice chips, soup, jello, and all other liquids. Stay as active as you can everyday. Staying active will give you more energy and make your muscles stronger. Start with 5 minutes at a time and work your way up to 30 minutes a day. Break up your activities--do some in the morning and some in the afternoon. Start with 3 days per week and work your way up to 5 days as you can.  If you have chest pain, feel short of breath, dizzy, or lightheaded, STOP. If you don't feel better after a short rest, call 911. If you do feel better, call the office to let us know you have symptoms with exercise.    Heart-Healthy Eating Plan Eating a healthy diet is important for the health of your heart. A heart-healthy eating plan includes: Eating less unhealthy fats. Eating more healthy fats. Eating less salt in your food. Salt is also called sodium. Making other changes in your diet. Talk with your doctor or a diet specialist (dietitian) to create an eating plan that is right for you. What is my plan? What are tips for following this plan? Cooking Avoid frying your food. Try to bake, boil, grill, or broil it instead. You can also reduce fat by: Removing the skin from poultry. Removing all visible fats from meats. Steaming vegetables in water or broth. Meal planning  At meals, divide your plate into four equal parts: Fill one-half of your plate with vegetables and green salads. Fill one-fourth of your plate with whole grains. Fill one-fourth of your plate with lean protein foods. Eat 2-4 cups of vegetables per day. One cup of vegetables is: 1 cup (91 g) broccoli or cauliflower florets. 2 medium carrots. 1 large bell pepper. 1 large sweet potato. 1 large tomato. 1 medium white potato. 2 cups (150 g) raw leafy greens. Eat 1-2 cups of fruit per day. One cup of  fruit is: 1 small apple 1 large banana 1 cup (237 g) mixed fruit, 1 large orange,  cup (82 g) dried fruit, 1 cup (240 mL) 100% fruit juice. Eat more foods that have soluble fiber. These are apples, broccoli, carrots, beans, peas, and barley. Try to get 20-30 g of fiber per day. Eat 4-5 servings of nuts, legumes, and seeds per week: 1 serving of dried beans or legumes equals  cup (90 g) cooked. 1 serving of nuts is  oz (12 almonds, 24 pistachios, or 7 walnut halves). 1 serving of seeds equals  oz (8 g). General information Eat more home-cooked food. Eat less restaurant, buffet, and fast food. Limit or avoid alcohol. Limit foods that are high in starch and sugar. Avoid fried foods. Lose weight if you are overweight. Keep track of how much salt (sodium) you eat. This is important if you have high blood pressure. Ask your doctor to tell you more about this. Try to add vegetarian meals each week. Fats Choose healthy fats. These include olive oil and canola oil, flaxseeds, walnuts, almonds, and seeds. Eat more omega-3 fats. These include salmon, mackerel, sardines, tuna, flaxseed oil, and ground flaxseeds. Try to eat fish at least 2 times each week. Check food labels. Avoid foods with trans fats or high amounts of saturated fat. Limit saturated fats. These are often found in animal products, such as meats, butter, and cream. These are also found in plant foods, such as palm oil, palm kernel oil, and coconut oil. Avoid foods with partially hydrogenated oils in them. These have trans fats. Examples are stick margarine, some tub margarines, cookies, crackers, and other baked goods. What foods should I eat? Fruits All fresh, canned (in natural juice), or frozen fruits. Vegetables Fresh or frozen vegetables (raw, steamed, roasted, or grilled). Green salads. Grains Most grains. Choose whole wheat and whole grains most of the time. Rice and pasta, including brown rice and pastas made with  whole wheat. Meats and other proteins Lean, well-trimmed beef, veal, pork, and lamb. Chicken and [place] without skin. All fish and shellfish. Wild duck, rabbit, pheasant, and venison. Egg whites or low-cholesterol egg substitutes. Dried beans, peas, lentils, and tofu. Seeds and most nuts. Dairy Low-fat or nonfat cheeses, including ricotta and mozzarella. Skim or 1% milk that is liquid, powdered, or evaporated. Buttermilk that is made with low-fat milk. Nonfat or low-fat yogurt. Fats and oils Non-hydrogenated (trans-free) margarines. Vegetable oils, including soybean, sesame, sunflower, olive, peanut, safflower, corn, canola, and cottonseed. Salad dressings or mayonnaise made with a vegetable oil. Beverages Mineral water. Coffee and tea. Diet carbonated beverages. Sweets and desserts Sherbet, gelatin, and fruit ice. Small amounts of dark chocolate. Limit all sweets and desserts. Seasonings and condiments All seasonings and condiments. The items listed above may not be a complete list of foods and drinks you can eat. Contact a dietitian for more options. What foods should I avoid? Fruits Canned fruit in heavy syrup. Fruit in cream  or butter sauce. Fried fruit. Limit coconut. Vegetables Vegetables cooked in cheese, cream, or butter sauce. Fried vegetables. Grains Breads that are made with saturated or trans fats, oils, or whole milk. Croissants. Sweet rolls. Donuts. High-fat crackers, such as cheese crackers. Meats and other proteins Fatty meats, such as hot dogs, ribs, sausage, bacon, rib-eye roast or steak. High-fat deli meats, such as salami and bologna. Caviar. Domestic duck and goose. Organ meats, such as liver. Dairy Cream, sour cream, cream cheese, and creamed cottage cheese. Whole-milk cheeses. Whole or 2% milk that is liquid, evaporated, or condensed. Whole buttermilk. Cream sauce or high-fat cheese sauce. Yogurt that is made from whole milk. Fats and oils Meat fat, or shortening.  Cocoa butter, hydrogenated oils, palm oil, coconut oil, palm kernel oil. Solid fats and shortenings, including bacon fat, salt pork, lard, and butter. Nondairy cream substitutes. Salad dressings with cheese or sour cream. Beverages Regular sodas and juice drinks with added sugar. Sweets and desserts Frosting. Pudding. Cookies. Cakes. Pies. Milk chocolate or white chocolate. Buttered syrups. Full-fat ice cream or ice cream drinks. The items listed above may not be a complete list of foods and drinks to avoid. Contact a dietitian for more information. Summary Heart-healthy meal planning includes eating less unhealthy fats, eating more healthy fats, and making other changes in your diet. Eat a balanced diet. This includes fruits and vegetables, low-fat or nonfat dairy, lean protein, nuts and legumes, whole grains, and heart-healthy oils and fats. This information is not intended to replace advice given to you by your health care provider. Make sure you discuss any questions you have with your health care provider. Document Revised: 06/15/2021 Document Reviewed: 06/15/2021 Elsevier Patient Education  2024 Elsevier Inc.   1st Floor: - Lobby - Registration  - Pharmacy  - Lab - Cafe  2nd Floor: - PV Lab - Diagnostic Testing (echo, CT, nuclear med)  3rd Floor: - Vacant  4th Floor: - TCTS (cardiothoracic surgery) - AFib Clinic - Structural Heart Clinic - Vascular Surgery  - Vascular Ultrasound  5th Floor: - [facility] (general and EP) - Clinical Pharmacy for coumadin, hypertension, lipid, weight-loss medications, and med management appointments    Valet parking services will be available as well.

## 2023-08-29 NOTE — Patient Instructions (Signed)
 HOLD Today and Tuesday then Continue 0.5 tablet daily except for 1 tablet on Monday, Wednesday and Friday.   Repeat INR in 2 weeks. Coumadin Clinic [phone];  PATIENT WAS TAKING 1 TABLET ON Sundays.

## 2023-09-02 ENCOUNTER — Ambulatory Visit: Payer: Medicare Other | Admitting: Professional Counselor

## 2023-09-02 ENCOUNTER — Telehealth: Payer: Self-pay | Admitting: Physician Assistant

## 2023-09-02 ENCOUNTER — Encounter: Payer: Self-pay | Admitting: Professional Counselor

## 2023-09-02 DIAGNOSIS — F411 Generalized anxiety disorder: Secondary | ICD-10-CM

## 2023-09-02 DIAGNOSIS — F331 Major depressive disorder, recurrent, moderate: Secondary | ICD-10-CM | POA: Diagnosis not present

## 2023-09-02 NOTE — Progress Notes (Signed)
      [facility] Counselor/Therapist Progress Note  Patient ID: Abigail Oliver, MRN: [id number],    Date: 09/02/2023  Time Spent: 10:38 AM to 11:16 AM  Treatment Type: Individual Therapy  Reported Symptoms: Sleeplessness, tearfulness, forgetfulness, interpersonal concerns, stress, health concerns  Mental Status Exam:  Appearance:   Neat     Behavior:  Appropriate and Sharing  Motor:  Restlessness, unsteadiness  Speech/Language:   Clear and Coherent and Normal Rate  Affect:  Tearful  Mood:  anxious and sad  Thought process:  normal  Thought content:    WNL  Sensory/Perceptual disturbances:    WNL  Orientation:  oriented to person, place, time/date, and situation  Attention:  Good  Concentration:  Good  Memory:  WNL  Fund of knowledge:   Good  Insight:    Good  Judgment:   Good  Impulse Control:  Good   Risk Assessment: Danger to Self:  No Self-injurious Behavior: No Danger to Others: No Duty to Warn:no Physical Aggression / Violence:No  Access to Firearms a concern: No  Gang Involvement:No   Subjective: Patient presented to session to address concerns of anxiety and depression.  She reported minimal progress at this time.  She reported "I am going to crash and burn" given her experience of overwhelm.  She identified feeling unsteady on her feet on a regular basis.  Patient processed experience of continued unfolding events with her daughter as relates charges and navigation of such.  Patient processed her experience of sadness and frustration around these circumstances.  She processed the experience of antagonism from her sister as well, and her fullness of this dynamic.  She also processed her experience of her husband's decline both mentally and physically and her concern for him, and her own health concerns.  Counselor actively listened, affirmed patient feelings and experience, provided supportive therapy, and helped patient to develop strategies to implement boundaries  where needed, so as to prioritize her self-care and have energy for her caregiving role with her husband.  Counselor assisted in facilitation of patient meeting with nurse on call to discuss her exacerbated symptomology and possibility of medication adjustment at the close of session.  Interventions: Assertiveness/Communication, Humanistic/Existential, and Insight-Oriented  Diagnosis:   ICD-10-CM   1. Generalized anxiety disorder  F41.1     2. Major depressive disorder, recurrent episode, moderate (HCC)  F33.1       Plan: Patient is scheduled for follow-up; continue process work and developing coping skills.  Patient short-term goal between sessions to prioritize self-care, resourcing of external supports as needed, and continued implementation of boundaries interpersonally.  Progress note was dictated with Dragon and reviewed for accuracy.  Anthon Kins, [facility]

## 2023-09-02 NOTE — Telephone Encounter (Signed)
 Patient is prescribed trazodone 100 mg, can take 2 at bedtime, but has only been taking one. Told her to take 2 thru the weekend and see if that helped with sleep, and to FU on Monday.

## 2023-09-02 NOTE — Telephone Encounter (Signed)
 Pt is checking out for therapy with [facility] and is asking for a call back about meds.  She is not sleeping and is in  a heightened state of anxiety.  Next appt 4/21

## 2023-09-12 ENCOUNTER — Ambulatory Visit (INDEPENDENT_AMBULATORY_CARE_PROVIDER_SITE_OTHER): Payer: Medicare Other | Admitting: Physician Assistant

## 2023-09-12 ENCOUNTER — Ambulatory Visit: Attending: Cardiovascular Disease

## 2023-09-12 DIAGNOSIS — Z7901 Long term (current) use of anticoagulants: Secondary | ICD-10-CM | POA: Diagnosis not present

## 2023-09-12 DIAGNOSIS — Z91199 Patient's noncompliance with other medical treatment and regimen due to unspecified reason: Secondary | ICD-10-CM

## 2023-09-12 DIAGNOSIS — Z952 Presence of prosthetic heart valve: Secondary | ICD-10-CM | POA: Diagnosis not present

## 2023-09-12 LAB — POCT INR: INR: 2.7 (ref 2.0–3.0)

## 2023-09-12 NOTE — Patient Instructions (Signed)
 Continue 0.5 tablet daily except for 1 tablet on Monday, Wednesday and Friday.   Repeat INR in 4 weeks. [facility] [phone]

## 2023-09-12 NOTE — Progress Notes (Signed)
 No show

## 2023-09-14 DIAGNOSIS — N1832 Chronic kidney disease, stage 3b: Secondary | ICD-10-CM | POA: Diagnosis not present

## 2023-09-14 DIAGNOSIS — M810 Age-related osteoporosis without current pathological fracture: Secondary | ICD-10-CM | POA: Diagnosis not present

## 2023-09-14 DIAGNOSIS — E1140 Type 2 diabetes mellitus with diabetic neuropathy, unspecified: Secondary | ICD-10-CM | POA: Diagnosis not present

## 2023-09-16 ENCOUNTER — Encounter: Payer: Self-pay | Admitting: Professional Counselor

## 2023-09-16 ENCOUNTER — Other Ambulatory Visit: Payer: Self-pay | Admitting: Physician Assistant

## 2023-09-16 ENCOUNTER — Ambulatory Visit: Admitting: Professional Counselor

## 2023-09-16 DIAGNOSIS — F411 Generalized anxiety disorder: Secondary | ICD-10-CM

## 2023-09-16 DIAGNOSIS — F331 Major depressive disorder, recurrent, moderate: Secondary | ICD-10-CM

## 2023-09-16 NOTE — Progress Notes (Signed)
      [facility] Counselor/Therapist Progress Note  Patient ID: Abigail Oliver, MRN: [id number],    Date: 09/16/2023  Time Spent: 1:10 PM to 2:08 PM  Treatment Type: Individual Therapy  Reported Symptoms: Stress, worries, sadness, health concerns, caregiver strain, frustration, family conflict, low mood, fatigue  Mental Status Exam:  Appearance:   Neat     Behavior:  Appropriate, Sharing, and Motivated  Motor:  Normal  Speech/Language:   Clear and Coherent and Normal Rate  Affect:  Appropriate and Congruent  Mood:  normal  Thought process:  normal  Thought content:    WNL  Sensory/Perceptual disturbances:    WNL  Orientation:  oriented to person, place, time/date, and situation  Attention:  Good  Concentration:  Good  Memory:  WNL  Fund of knowledge:   Good  Insight:    Good  Judgment:   Good  Impulse Control:  Good   Risk Assessment: Danger to Self:  No Self-injurious Behavior: No Danger to Others: No Duty to Warn:no Physical Aggression / Violence:No  Access to Firearms a concern: No  Gang Involvement:No   Subjective: Patient presented to session to address concerns of anxiety and depression.  She reported mixed progress at this time.  She reported her husband's and her own health concerns, and unfolding events with her daughters life challenges at this time to continue to be focal point of her stress, anxiety and depression, and shared regarding developments.  Patient voiced intention to continue to implement boundaries including around court appearance which she feels she cannot handle physically or emotionally at this time.  Counselor affirmed patient listening to her needs and responding to them, in spite of inner conflict around decisions pertaining to implementation of boundaries that protect her wellbeing.  Counselor reinforced primacy of patient self-care, and voiced concern for patient health.  Patient shared a poem she found fortifying titled "this is closure".   She reflected on themes of respect, forgiveness, caring, accountability and honesty.  Counselor reinforced patient values and strengths.  Counselor and patient continue to discuss ways to continue to implement boundaries with her daughter, including being clear and firm, and reminding herself of her own value and needs, and that of her husband.  Interventions: Assertiveness/Communication, Solution-Oriented/Positive Psychology, Humanistic/Existential, and Insight-Oriented  Diagnosis:   ICD-10-CM   1. Generalized anxiety disorder  F41.1     2. Major depressive disorder, recurrent episode, moderate (HCC)  F33.1       Plan: Patient is scheduled for follow-up; continue process work and developing coping skills.  Patient short-term goal between sessions to continue to practice assertiveness and self advocacy skills and strategies, and be mindful of self compassion and honoring of her own needs and value.  Progress note was dictated with Dragon and reviewed for accuracy.  Anthon Kins, [facility]

## 2023-09-19 ENCOUNTER — Other Ambulatory Visit: Payer: Self-pay | Admitting: Adult Health

## 2023-09-21 ENCOUNTER — Encounter: Payer: Self-pay | Admitting: Cardiovascular Disease

## 2023-09-21 ENCOUNTER — Ambulatory Visit: Attending: Cardiovascular Disease | Admitting: Cardiovascular Disease

## 2023-09-21 VITALS — BP 120/62 | HR 62 | Ht 62.0 in | Wt 176.0 lb

## 2023-09-21 DIAGNOSIS — I739 Peripheral vascular disease, unspecified: Secondary | ICD-10-CM | POA: Insufficient documentation

## 2023-09-21 NOTE — Assessment & Plan Note (Signed)
 History of peripheral arterial disease status post right SFA PTA and stenting by myself/29/13.  Her follow-up Dopplers after that had improved as did her symptoms.  She currently denies claudication.  Her most recent Doppler studies performed 12/31/2022 revealed a right ABI of 1.01, left of 1.06 with a mild to moderately elevated signal in the proximal and mid right SFA.  This will be repeated on an annual basis.  I will see her back as needed.

## 2023-09-21 NOTE — Progress Notes (Signed)
 09/21/2023 Abigail Oliver   19-Jan-1946  [id number]  Primary Physician Medina-Vargas, Sid Dragon, NP Primary Cardiologist: Avanell Leigh MD Bennye Bravo, [place]  HPI:  Abigail Oliver is a 78 y.o.    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  She has also seen Dr. Mitzie Anda for diastolic heart failure.  She was referred to me through the courtesy of Dr. Terrace Ferrara for peripheral vascular evaluation because of lifestyle limiting claudication in her right leg.  I last saw her in the office 03/23/2022.    Her other problems include hypertension, hyperlipidemia and diabetes. She has had coronary bypass grafting in 2000 as well as aortic valve replacement with a [employer]. Jude aortic valve. Her Myoview  performed within the last several weeks with nonischemic.  Lower extremity arterial Doppler studies performed prior to her intervention revealed a right ABI of 0.81 with a high-frequency signal in the mid right SFA the left ABI 1.1.    I performed PTA and nitinol self-expanding stenting of an 80% mid right SFA stenosis on 09/20/2011.  She did well after that although I did not see her back in the office.  She has been followed by Dr. Mitzie Anda for diastolic heart failure.  She has complained of some back hip and knee pain as well as symptoms compatible with diabetic peripheral neuropathy although in talking to her today it does not sound as though she has claudication symptoms.  Doppler studies performed 01/23/2019 revealed normal ABIs bilaterally with a moderately elevated signal in the proximal right popliteal artery.  I suggested that we continue to follow that noninvasively and clinically.  She did break her hip and had ACS during her hospitalization.  She underwent cardiac catheterization by Dr.Thukkani revealing an occluded RCA with a patent RIMA to the distal RCA, patent proximal LAD stent with no other  significant CAD.  She complains of pain in her arms and legs which sounds arthritic.  She denies chest pain or shortness of breath.  Her last Doppler study performed 02/03/2022 revealed a right ABI 1.09, left of 0.92 with a moderately elevated velocity in the mid right SFA which had not changed since her prior study a year ago.  Since I saw her a year and a half ago she has been stable.  She denies claudication.  Her lower extremity arterial Dopplers performed 12/31/2022 revealed normal ABIs bilaterally with mild to moderately elevated velocities in her proximal and mid right SFA.  She does complain of some mild dyspnea but denies chest pain.   Current Meds  Medication Sig   Ascorbic Acid  (VITAMIN C ) 1000 MG tablet Take 1,000 mg by mouth every evening.   aspirin  EC 81 MG tablet Take 81 mg by mouth every evening.   benzonatate  (TESSALON ) 200 MG capsule Take 1 capsule (200 mg total) by mouth 2 (two) times daily as needed for cough.   cyanocobalamin  (VITAMIN B12) 1000 MCG tablet Take 1,000 mcg by mouth daily.   fluticasone  (FLONASE ) 50 MCG/ACT nasal spray Place 2 sprays into both nostrils as needed for allergies or rhinitis.   folic acid  (FOLVITE ) 800 MCG tablet Take 800 mcg by mouth daily.   latanoprost  (XALATAN ) 0.005 % ophthalmic solution PLACE 1 DROP INTO BOTH EYES AT BEDTIME.   metoprolol  succinate (TOPROL  XL) 50 MG 24 hr tablet Take 1 tablet (50 mg total) by mouth daily. Take with or immediately following a  meal.   Multiple Vitamin (MULTIVITAMIN WITH MINERALS) TABS tablet Take 2 tablets by mouth daily.   nitroGLYCERIN  (NITROSTAT ) 0.4 MG SL tablet Place 1 tablet (0.4 mg total) under the tongue every 5 (five) minutes as needed for chest pain.   nystatin  (MYCOSTATIN /NYSTOP ) powder Apply 1 Application topically 3 (three) times daily.   pantoprazole  (PROTONIX ) 40 MG tablet Take 1 tablet (40 mg total) by mouth daily.   potassium chloride  SA (KLOR-CON  M20) 20 MEQ tablet Take 1 tablet (20 mEq total) by  mouth daily.   Psyllium (METAMUCIL) 0.36 g CAPS Take 1 tablet by mouth daily.   rosuvastatin  (CRESTOR ) 20 MG tablet Take 1 tablet (20 mg total) by mouth every evening.   sertraline  (ZOLOFT ) 100 MG tablet TAKE 1 TABLET BY MOUTH DAILY   torsemide  (DEMADEX ) 20 MG tablet Take 1 tablet (20 mg total) by mouth daily. increase her Torsemide  to 20mg  twice daily for 3 days THEN BACK TO ONCE DAILY   traZODone  (DESYREL ) 100 MG tablet TAKE A HALF TO 2 TABLETS BY MOUTH AT BEDTIME AS NEEDED FOR SLEEP   valsartan  (DIOVAN ) 40 MG tablet Take 1 tablet (40 mg total) by mouth daily.   warfarin (COUMADIN ) 3 MG tablet TAKE 1/2 TO 1 TABLET BY MOUTH DAILY AS DIRECTED (Patient taking differently: Take 1.5-3 mg by mouth as directed. TAKE 1 TABLET (3 MG) ON Mon,Wed,Fri,Sun & TAKE 1/2 TABLET (1.5 MG) on Tues,Thurs,Sat)   [DISCONTINUED] loratadine  (CLARITIN ) 10 MG tablet Take 1 tablet (10 mg total) by mouth daily.   [DISCONTINUED] ondansetron  (ZOFRAN -ODT) 4 MG disintegrating tablet Take 1 tablet (4 mg total) by mouth every 8 (eight) hours as needed for nausea or vomiting.     Allergies  Allergen Reactions   Atorvastatin      Muscle pain   Dapagliflozin     Other reaction(s): nausea   Gabapentin      Other reaction(s): stomach issues   Simvastatin  Other (See Comments)    Muscle pain   Sulfa  Antibiotics     unknown   Iodinated Contrast Media Rash     Red rash after cardiac cath 1 wk ago, ? Contrast allergy, requires 13 hr prep now per [email or web address]      Social History   Socioeconomic History   Marital status: Married    Spouse name: Drexel Gentles   Number of children: 2   Years of education: 14   Highest education level: Not on file  Occupational History    Comment: hair dresser  Tobacco Use   Smoking status: Never   Smokeless tobacco: Never  Vaping Use   Vaping status: Never Used  Substance and Sexual Activity   Alcohol  use: Not Currently   Drug use: Not Currently   Sexual activity: Never  Other  Topics Concern   Not on file  Social History Narrative   Pt is a high school graduate with 2 years of college. Married in 1967 she has 1 son born 83 and 1 daughter born 20 and 1 grandchild. 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      Caffeine use- 2-3 /day, soda, tea       Lives with husband one story home   Social Drivers of Health   Financial Resource Strain: Low Risk  (07/22/2023)   Overall Financial Resource Strain (CARDIA)    Difficulty of Paying Living Expenses: Not hard at all  Food Insecurity: No Food Insecurity (07/22/2023)   Hunger Vital Sign    Worried About  Running Out of Food in the Last Year: Never true    Ran Out of Food in the Last Year: Never true  Transportation Needs: No Transportation Needs (07/22/2023)   PRAPARE - Administrator, Civil Service (Medical): No    Lack of Transportation (Non-Medical): No  Physical Activity: Inactive (07/22/2023)   Exercise Vital Sign    Days of Exercise per Week: 0 days    Minutes of Exercise per Session: 0 min  Stress: Stress Concern Present (07/22/2023)   [employer] of Occupational Health - Occupational Stress Questionnaire    Feeling of Stress : Very much  Social Connections: Socially Isolated (07/22/2023)   Social Connection and Isolation Panel [NHANES]    Frequency of Communication with Friends and Family: Once a week    Frequency of Social Gatherings with Friends and Family: Once a week    Attends Religious Services: Never    Database administrator or Organizations: No    Attends Banker Meetings: Never    Marital Status: Married  Catering manager Violence: Not At Risk (07/22/2023)   Humiliation, Afraid, Rape, and Kick questionnaire    Fear of Current or Ex-Partner: No    Emotionally Abused: No    Physically Abused: No    Sexually Abused: No     Review of Systems: General: negative for chills, fever, night sweats or weight changes.  Cardiovascular: negative for  chest pain, dyspnea on exertion, edema, orthopnea, palpitations, paroxysmal nocturnal dyspnea or shortness of breath Dermatological: negative for rash Respiratory: negative for cough or wheezing Urologic: negative for hematuria Abdominal: negative for nausea, vomiting, diarrhea, bright red blood per rectum, melena, or hematemesis Neurologic: negative for visual changes, syncope, or dizziness All other systems reviewed and are otherwise negative except as noted above.    Blood pressure 120/62, pulse 62, height 5\' 2"  (1.575 m), weight 176 lb (79.8 kg), SpO2 97%.  General appearance: alert and no distress Neck: no adenopathy, no carotid bruit, no JVD, supple, symmetrical, trachea midline, and thyroid  not enlarged, symmetric, no tenderness/mass/nodules Lungs: clear to auscultation bilaterally Heart: Crisp prosthetic mechanical aortic valve sounds. Extremities: extremities normal, atraumatic, no cyanosis or edema Pulses: 2+ and symmetric Skin: Skin color, texture, turgor normal. No rashes or lesions Neurologic: Grossly normal  EKG not performed today      ASSESSMENT AND PLAN:   PVD, Rt SFA PTA/Stent 09/20/11 History of peripheral arterial disease status post right SFA PTA and stenting by myself/29/13.  Her follow-up Dopplers after that had improved as did her symptoms.  She currently denies claudication.  Her most recent Doppler studies performed 12/31/2022 revealed a right ABI of 1.01, left of 1.06 with a mild to moderately elevated signal in the proximal and mid right SFA.  This will be repeated on an annual basis.  I will see her back as needed.     Avanell Leigh MD FACP,FACC,FAHA, [facility] 09/21/2023 9:13 AM

## 2023-09-21 NOTE — Patient Instructions (Signed)
 Medication Instructions:  Your physician recommends that you continue on your current medications as directed. Please refer to the Current Medication list given to you today.  *If you need a refill on your cardiac medications before your next appointment, please call your pharmacy*   Testing/Procedures: Your physician has requested that you have a lower extremity arterial duplex. During this test, ultrasound is used to evaluate arterial blood flow in the legs. Allow one hour for this exam. There are no restrictions or special instructions.  **To be done in August**  Please note: We ask at that you not bring children with you during ultrasound (echo/ vascular) testing. Due to room size and safety concerns, children are not allowed in the ultrasound rooms during exams. Our front office staff cannot provide observation of children in our lobby area while testing is being conducted. An adult accompanying a patient to their appointment will only be allowed in the ultrasound room at the discretion of the ultrasound technician under special circumstances. We apologize for any inconvenience.   Your physician has requested that you have an ankle brachial index (ABI). During this test an ultrasound and blood pressure cuff are used to evaluate the arteries that supply the arms and legs with blood. Allow thirty minutes for this exam. There are no restrictions or special instructions.  **To be done in August**  Please note: We ask at that you not bring children with you during ultrasound (echo/ vascular) testing. Due to room size and safety concerns, children are not allowed in the ultrasound rooms during exams. Our front office staff cannot provide observation of children in our lobby area while testing is being conducted. An adult accompanying a patient to their appointment will only be allowed in the ultrasound room at the discretion of the ultrasound technician under special circumstances. We apologize for any  inconvenience.   Follow-Up: At [facility], you and your health needs are our priority.  As part of our continuing mission to provide you with exceptional heart care, our providers are all part of one team.  This team includes your primary Cardiologist (physician) and Advanced Practice Providers or APPs (Physician Assistants and Nurse Practitioners) who all work together to provide you with the care you need, when you need it.  Your next appointment:   We will see you on an as needed basis   Provider:   Lauro Portal, MD    We recommend signing up for the patient portal called "MyChart".  Sign up information is provided on this After Visit Summary.  MyChart is used to connect with patients for Virtual Visits (Telemedicine).  Patients are able to view lab/test results, encounter notes, upcoming appointments, etc.  Non-urgent messages can be sent to your provider as well.   To learn more about what you can do with MyChart, go to [email or web address].

## 2023-09-22 ENCOUNTER — Other Ambulatory Visit: Payer: Self-pay | Admitting: Adult Health

## 2023-09-26 ENCOUNTER — Ambulatory Visit (INDEPENDENT_AMBULATORY_CARE_PROVIDER_SITE_OTHER): Payer: Medicare Other | Admitting: Adult Health

## 2023-09-26 ENCOUNTER — Encounter: Payer: Self-pay | Admitting: Adult Health

## 2023-09-26 VITALS — BP 138/58 | HR 58 | Temp 96.1°F | Ht 66.0 in | Wt 175.6 lb

## 2023-09-26 DIAGNOSIS — L920 Granuloma annulare: Secondary | ICD-10-CM

## 2023-09-26 DIAGNOSIS — E1143 Type 2 diabetes mellitus with diabetic autonomic (poly)neuropathy: Secondary | ICD-10-CM | POA: Diagnosis not present

## 2023-09-26 DIAGNOSIS — N1832 Chronic kidney disease, stage 3b: Secondary | ICD-10-CM

## 2023-09-26 DIAGNOSIS — Z952 Presence of prosthetic heart valve: Secondary | ICD-10-CM | POA: Diagnosis not present

## 2023-09-26 DIAGNOSIS — H402234 Chronic angle-closure glaucoma, bilateral, indeterminate stage: Secondary | ICD-10-CM

## 2023-09-26 DIAGNOSIS — K219 Gastro-esophageal reflux disease without esophagitis: Secondary | ICD-10-CM

## 2023-09-26 DIAGNOSIS — E785 Hyperlipidemia, unspecified: Secondary | ICD-10-CM | POA: Diagnosis not present

## 2023-09-26 DIAGNOSIS — I5032 Chronic diastolic (congestive) heart failure: Secondary | ICD-10-CM | POA: Diagnosis not present

## 2023-09-26 DIAGNOSIS — F418 Other specified anxiety disorders: Secondary | ICD-10-CM

## 2023-09-26 DIAGNOSIS — I2510 Atherosclerotic heart disease of native coronary artery without angina pectoris: Secondary | ICD-10-CM | POA: Diagnosis not present

## 2023-09-26 DIAGNOSIS — I10 Essential (primary) hypertension: Secondary | ICD-10-CM

## 2023-09-26 MED ORDER — ROSUVASTATIN CALCIUM 20 MG PO TABS: 20.0000 mg | ORAL_TABLET | Freq: Every evening | ORAL | 1 refills | Status: DC

## 2023-09-26 MED ORDER — METAMUCIL 0.36 G PO CAPS: 1.0000 | ORAL_CAPSULE | Freq: Every day | ORAL | Status: AC | PRN

## 2023-09-26 NOTE — Progress Notes (Signed)
 [facility] clinic  Provider:  Inge Mangle DNP  Code Status:  Full Code  Goals of Care:     07/15/2023    2:47 PM  Advanced Directives  Does Patient Have a Medical Advance Directive? Yes  Type of Estate agent of [place];Living will  Copy of Healthcare Power of Attorney in Chart? No - copy requested     Chief Complaint  Patient presents with   Follow-up    3 month follow up , Htn , she said every thing has been good , she has no new concerns    Discussed the use of AI scribe software for clinical note transcription with the patient, who gave verbal consent to proceed.   HPI: Patient is a 78 y.o. female seen today for a 60-month follow up of chronic medical issues.  She was accompanied today by her husband of 56 years.  Her recent A1c was checked at [facility] and reported as good, though she does not recall the exact number. She no longer takes metformin  due to her chronic kidney disease stage 3B. Her last A1c in January 2025  was 7.1, improved to A1C 5.8 on 09/14/23. She does not take any medication for diabetes currently.  She has chronic kidney disease stage 3B, with her last kidney function test in February 2025   showing an improvement from an eGFR of 24 to 30.   For her coronary artery disease, she cannot recall the last time she experienced chest pain and carries nitroglycerin  as needed. She takes rosuvastatin  20 mg in the evening, baby aspirin  81 mg daily, and has a history of aortic valve replacement for which she takes Coumadin . Her last INR was 3.4, within her goal range of 2.5 to 3.5.  She experiences palpitations occasionally and takes metoprolol  succinate 50 mg daily. She saw a cardiology on August 29, 2023 and continues on metoprolol  for heart rate control.  She has a rash diagnosed as granuloma annulare, which spreading to her chest and hands. She uses triamcinolone  cream as prescribed by her dermatologist and is awaiting a follow-up  appointment.  She experiences acid reflux after every meal and takes Protonix  40 mg daily. She attributes some of her symptoms to stress related to family issues.  She has a history of depression and anxiety, for which she takes sertraline  100 mg daily and trazodone  100 mg at bedtime. Her sertraline  dose was recently increased from 50 mg. She reports difficulty sleeping, averaging about five hours per night.  She takes torsemide  20 mg daily for congestive heart failure and reports being winded after walking short distances. She also takes potassium supplements to counteract the effects of the diuretic.  She has a history of glaucoma and uses latanoprost  ophthalmic drops. She is scheduled for a follow-up with her eye doctor today.  She reports loose bowel movements after every meal, which she attributes to stress. She takes psyllium daily to manage her bowel movements.  She works at [employer], an assisted living facility, and walks regularly for exercise. She reports being overweight with a BMI of 28.34.     Past Medical History:  Diagnosis Date   Anemia    Anxiety    Aortic stenosis    a. Now has St Jude mechanical aortic valve (6/00). b. Echo (6/15) with EF 60-65%, mechanical aortic valve with mean gradient 27 mmHg.   Arthritis    a. Possible c-spine arthritis with pain down left arm.     Carotid  artery disease (HCC)    a. Carotid dopplers (7/16) with 40-59% BICA stenosis.     Chronic angle-closure glaucoma(365.23)    Chronic diastolic CHF (congestive heart failure) (HCC)    a.  RHC (7/15) with mean RA 2, PA 22/11, mean PCWP 9, CI 3.79.   Contrast media allergy    Coronary atherosclerosis of native coronary artery    a. CABG at time of AVR in 6/00 with RIMA-RCA. b. Abnl nuc 11/2013 -> LHC (7/15) with patent RIMA-RCA, 80% mRCA, 80% pLAD with FFR 0.73, treated with DES to pLAD.   Depression    Essential hypertension    GERD (gastroesophageal reflux disease)     Hyperlipidemia    Low back pain    Neuromuscular disorder (HCC)    neuropathy   Peripheral vascular disease (HCC)    a, H/o Right SFA stent. b. Peripheral arterial dopplers (7/16) with right SFA stent patent.     PONV (postoperative nausea and vomiting)    N&V   Type II diabetes mellitus (HCC)     Past Surgical History:  Procedure Laterality Date   BILATERAL OOPHORECTOMY  05/24/1985   BREAST BIOPSY  05/24/2010   left   CARDIAC CATHETERIZATION  01/14/1999   normal LV function, severe aortic stenosis; 80% and 70% stenosis in RCA; mild 20% distal norrowing in L main with 20% proximal LAD stenosis, 40% diagonal stenosis and 20% proximal circumflex stenosis   CARDIAC VALVE REPLACEMENT  05/24/1998   aortic valve    CARDIOVASCULAR STRESS TEST  08/25/2011   R/L MV - EF 72%; no scintigraphic evidence of inducible MI; normal perfusionTID of 1.25 elevated - could indicate small vessle subendocardial ischemia; EKG NSR at 66, non diagnostic for ischemia   CARPAL TUNNEL RELEASE  05/25/1987   bilaterally   CATARACT EXTRACTION W/ INTRAOCULAR LENS  IMPLANT, BILATERAL  ~ 2007   CESAREAN SECTION  1969; 1971   CHOLECYSTECTOMY  05/24/1988   COLONOSCOPY N/A 10/27/2012   Procedure: COLONOSCOPY;  Surgeon: Almeda Aris, MD;  Location: [facility];  Service: Endoscopy;  Laterality: N/A;   CORONARY ARTERY BYPASS GRAFT  05/24/1998   RIMA to RCA   CORONARY/GRAFT ANGIOGRAPHY N/A 09/07/2021   Procedure: CORONARY/GRAFT ANGIOGRAPHY;  Surgeon: Kyra Phy, MD;  Location: [facility] CV [facility];  Service: Cardiovascular;  Laterality: N/A;   DOPPLER ECHOCARDIOGRAPHY  03/29/2012   EF >55%; mild concentric LVH; stage 1 diastolic dysfunction, elevated LV filling pressure, dilated LA; MAC mild MR; St Jude AVR peak and mean gradients of and ; transvalvular gradients have increased (prev 23 and 14 respectively)   ESOPHAGOGASTRODUODENOSCOPY N/A 10/27/2012   Procedure: ESOPHAGOGASTRODUODENOSCOPY (EGD);   Surgeon: Almeda Aris, MD;  Location: [facility];  Service: Endoscopy;  Laterality: N/A;   FEMORAL ARTERY STENT  09/20/2011   6 x 40 Smart Nitinol self-expanding stent placed;  10/15/2031 -R SFA stent open and patent w/o evidence of restenosis   FRACTIONAL FLOW RESERVE WIRE  12/14/2013   Procedure: FRACTIONAL FLOW RESERVE WIRE;  Surgeon: Darlis Eisenmenger, MD;  Location: [facility] CATH LAB;  Service: Cardiovascular;;   KNEE SURGERY Left    LACERATION REPAIR     right hand   LEFT AND RIGHT HEART CATHETERIZATION WITH CORONARY ANGIOGRAM N/A 12/14/2013   Procedure: LEFT AND RIGHT HEART CATHETERIZATION WITH CORONARY ANGIOGRAM;  Surgeon: Darlis Eisenmenger, MD;  Location: [facility] CATH LAB;  Service: Cardiovascular;  Laterality: N/A;   LOWER EXTREMITY ANGIOGRAM N/A 09/20/2011   Procedure: LOWER EXTREMITY ANGIOGRAM;  Surgeon:  Avanell Leigh, MD;  Location: [facility] CATH LAB;  Service: Cardiovascular;  Laterality: N/A;   LYMPH NODE BIOPSY  06/25/2011   "core needle on 5"   needle biopsy  05/25/2007   "on ankles for nerve damage"   NEUROPLASTY / TRANSPOSITION ULNAR NERVE AT ELBOW     right   ORIF FEMUR FRACTURE Left 09/09/2021   Procedure: OPEN REDUCTION INTERNAL FIXATION (ORIF) DISTAL FEMUR FRACTURE;  Surgeon: Laneta Pintos, MD;  Location: [facility] OR;  Service: Orthopedics;  Laterality: Left;   PERCUTANEOUS CORONARY STENT INTERVENTION (PCI-S)  12/14/2013   Procedure: PERCUTANEOUS CORONARY STENT INTERVENTION (PCI-S);  Surgeon: Darlis Eisenmenger, MD;  Location: [facility] CATH LAB;  Service: Cardiovascular;;  Prox LAD 3.00x12 Promus DES    PERIPHERAL ARTERIAL STENT GRAFT  09/20/2011   right SFA   REFRACTIVE SURGERY  ` 2004   "for glaucoma"   TONSILLECTOMY  05/24/1966   TRIGGER FINGER RELEASE Left 12/04/2015   Procedure: LEFT RING FINGER TRIGGER RELEASE;  Surgeon: Brunilda Capra, MD;  Location: [facility];  Service: Orthopedics;  Laterality: Left;   VAGINAL HYSTERECTOMY  05/25/1983   Fibroids    Allergies   Allergen Reactions   Atorvastatin      Muscle pain   Dapagliflozin     Other reaction(s): nausea   Gabapentin      Other reaction(s): stomach issues   Simvastatin  Other (See Comments)    Muscle pain   Sulfa  Antibiotics     unknown   Iodinated Contrast Media Rash     Red rash after cardiac cath 1 wk ago, ? Contrast allergy, requires 13 hr prep now per [email or web address]      Outpatient Encounter Medications as of 09/26/2023  Medication Sig   Ascorbic Acid  (VITAMIN C ) 1000 MG tablet Take 1,000 mg by mouth every evening.   aspirin  EC 81 MG tablet Take 81 mg by mouth every evening.   cyanocobalamin  (VITAMIN B12) 1000 MCG tablet Take 1,000 mcg by mouth daily.   fluticasone  (FLONASE ) 50 MCG/ACT nasal spray Place 2 sprays into both nostrils as needed for allergies or rhinitis.   folic acid  (FOLVITE ) 800 MCG tablet Take 800 mcg by mouth daily.   latanoprost  (XALATAN ) 0.005 % ophthalmic solution PLACE 1 DROP INTO BOTH EYES AT BEDTIME.   metoprolol  succinate (TOPROL  XL) 50 MG 24 hr tablet Take 1 tablet (50 mg total) by mouth daily. Take with or immediately following a meal.   Multiple Vitamin (MULTIVITAMIN WITH MINERALS) TABS tablet Take 2 tablets by mouth daily.   nitroGLYCERIN  (NITROSTAT ) 0.4 MG SL tablet Place 1 tablet (0.4 mg total) under the tongue every 5 (five) minutes as needed for chest pain.   pantoprazole  (PROTONIX ) 40 MG tablet Take 1 tablet (40 mg total) by mouth daily.   potassium chloride  SA (KLOR-CON  M20) 20 MEQ tablet Take 1 tablet (20 mEq total) by mouth daily.   sertraline  (ZOLOFT ) 100 MG tablet TAKE 1 TABLET BY MOUTH DAILY   torsemide  (DEMADEX ) 20 MG tablet Take 1 tablet (20 mg total) by mouth daily. increase her Torsemide  to 20mg  twice daily for 3 days THEN BACK TO ONCE DAILY   traZODone  (DESYREL ) 100 MG tablet TAKE A HALF TO 2 TABLETS BY MOUTH AT BEDTIME AS NEEDED FOR SLEEP   valsartan  (DIOVAN ) 40 MG tablet Take 1 tablet (40 mg total) by mouth daily.   warfarin  (COUMADIN ) 3 MG tablet TAKE 1/2 TO 1 TABLET BY MOUTH DAILY AS DIRECTED (Patient taking differently: Take 1.5-3 mg by mouth  as directed. TAKE 1 TABLET (3 MG) ON Mon,Wed,Fri,Sun & TAKE 1/2 TABLET (1.5 MG) on Tues,Thurs,Sat)   [DISCONTINUED] benzonatate  (TESSALON ) 200 MG capsule Take 1 capsule (200 mg total) by mouth 2 (two) times daily as needed for cough.   [DISCONTINUED] nystatin  (MYCOSTATIN /NYSTOP ) powder Apply 1 Application topically 3 (three) times daily.   [DISCONTINUED] Psyllium (METAMUCIL) 0.36 g CAPS Take 1 tablet by mouth daily.   [DISCONTINUED] rosuvastatin  (CRESTOR ) 20 MG tablet Take 1 tablet (20 mg total) by mouth every evening.   Psyllium (METAMUCIL) 0.36 g CAPS Take 1 tablet by mouth daily as needed.   rosuvastatin  (CRESTOR ) 20 MG tablet Take 1 tablet (20 mg total) by mouth every evening.   No facility-administered encounter medications on file as of 09/26/2023.    Review of Systems:  Review of Systems  Constitutional:  Negative for appetite change, chills, fatigue and fever.  HENT:  Negative for congestion, hearing loss, rhinorrhea and sore throat.   Eyes: Negative.   Respiratory:  Negative for cough, shortness of breath and wheezing.   Cardiovascular:  Positive for palpitations. Negative for chest pain and leg swelling.  Gastrointestinal:  Negative for abdominal pain, constipation, diarrhea, nausea and vomiting.  Genitourinary:  Negative for dysuria.  Musculoskeletal:  Negative for arthralgias, back pain and myalgias.  Skin:  Negative for color change, rash and wound.  Neurological:  Negative for dizziness, weakness and headaches.  Psychiatric/Behavioral:  Negative for behavioral problems. The patient is not nervous/anxious.     Health Maintenance  Topic Date Due   COVID-19 Vaccine (6 - 2024-25 season) 10/12/2023 (Originally 01/23/2023)   Zoster Vaccines- Shingrix (1 of 2) 12/27/2023 (Originally 09/03/1964)   Pneumonia Vaccine 32+ Years old (1 of 2 - PCV) 09/25/2024  (Originally 09/03/1964)   HEMOGLOBIN A1C  11/23/2023   INFLUENZA VACCINE  12/23/2023   FOOT EXAM  02/16/2024   Medicare Annual Wellness (AWV)  05/26/2024   Diabetic kidney evaluation - Urine ACR  06/23/2024   Diabetic kidney evaluation - eGFR measurement  07/14/2024   OPHTHALMOLOGY EXAM  08/01/2024   DTaP/Tdap/Td (4 - Td or Tdap) 03/16/2026   DEXA SCAN  Completed   Hepatitis C Screening  Completed   HPV VACCINES  Aged Out   Meningococcal B Vaccine  Aged Out   Colonoscopy  Discontinued    Physical Exam: Vitals:   09/26/23 1035  BP: (!) 138/58  Pulse: (!) 58  Temp: (!) 96.1 F (35.6 C)  TempSrc: Temporal  SpO2: 98%  Weight: 175 lb 9.6 oz (79.7 kg)  Height: 5\' 6"  (1.676 m)   Body mass index is 28.34 kg/m. Physical Exam Constitutional:      Appearance: Normal appearance.  HENT:     Head: Normocephalic and atraumatic.     Nose: Nose normal.     Mouth/Throat:     Mouth: Mucous membranes are moist.  Eyes:     Conjunctiva/sclera: Conjunctivae normal.  Cardiovascular:     Rate and Rhythm: Normal rate and regular rhythm.  Pulmonary:     Effort: Pulmonary effort is normal.     Breath sounds: Normal breath sounds.  Abdominal:     General: Bowel sounds are normal.     Palpations: Abdomen is soft.  Musculoskeletal:        General: Normal range of motion.     Cervical back: Normal range of motion.  Skin:    General: Skin is warm and dry.     Comments: Flat pinkish-reddish rashes on bilateral lower legs and chest  Neurological:     General: No focal deficit present.     Mental Status: She is alert and oriented to person, place, and time.  Psychiatric:        Mood and Affect: Mood normal.        Behavior: Behavior normal.        Thought Content: Thought content normal.        Judgment: Judgment normal.     Labs reviewed: Basic Metabolic Panel: Recent Labs    06/24/23 1331 07/05/23 1529 07/15/23 1539  NA 138 137 138  K 3.9 4.5 4.3  CL 101 98 102  CO2 26 28 26    GLUCOSE 241* 173* 234*  BUN 35* 66* 48*  CREATININE 1.49* 2.08* 1.71*  CALCIUM  9.4 10.1 9.4   Liver Function Tests: Recent Labs    01/10/23 0000 03/30/23 1805  AST 20 38  ALT 11 27  ALKPHOS 80 64  BILITOT  --  0.9  PROT  --  7.2  ALBUMIN 4.6 4.0   Recent Labs    03/30/23 1805 03/31/23 0441  LIPASE 54* 42   No results for input(s): "AMMONIA" in the last 8760 hours. CBC: Recent Labs    01/10/23 0000 02/16/23 0936 03/30/23 1805 03/31/23 0441 04/01/23 0346 06/24/23 1201  WBC 6.2 4.8   < > 6.9 5.1 5.3  NEUTROABS 3.50 2,818  --   --   --  3,286  HGB 10.5* 9.8*   < > 9.6* 8.9* 10.2*  HCT 32* 31.6*   < > 30.4* 28.9* 32.3*  MCV  --  88.8   < > 90.5 87.3 87.8  PLT 128* 113*   < > 103* 84* 114*   < > = values in this interval not displayed.   Lipid Panel: Recent Labs    02/16/23 0936 05/26/23 1041  CHOL 167 145  HDL 46* 41*  LDLCALC 83 71  TRIG 286* 253*  CHOLHDL 3.6 3.5   Lab Results  Component Value Date   HGBA1C 7.1 (H) 05/26/2023    Procedures since last visit: No results found.  Assessment/Plan  1. Coronary artery disease involving native heart without angina pectoris, unspecified vessel or lesion type (Primary) -  No recent angina. Managed with nitroglycerin , aspirin , and rosuvastatin . - Continue nitroglycerin  as needed. - Continue aspirin  81 mg daily. - Continue rosuvastatin  20 mg in the evening. - Follows up with cardiology  2. Chronic diastolic heart failure (HCC) -  Exertional dyspnea due to obesity. Managed with torsemide  and potassium. - Continue torsemide  20 mg daily. - Continue potassium supplementation.  3. Essential hypertension -  Blood pressure 138/58 mmHg. Managed with valsartan  and metoprolol . No recent palpitations. - Continue valsartan  40 mg daily. - Continue metoprolol  succinate 50 mg daily.  4. Dyslipidemia -   Triglycerides improved to 253 mg/dL, from 161. Managed with rosuvastatin . - Continue rosuvastatin  20 mg in the  evening. - rosuvastatin  (CRESTOR ) 20 MG tablet; Take 1 tablet (20 mg total) by mouth every evening.  Dispense: 90 tablet; Refill: 1  5. History of aortic valve replacement -  takes Coumadin . Her last INR was 3.4, within her goal range of 2.5 to 3.5.  6. Depression with anxiety -  Managed with sertraline  and trazodone . Insomnia persists. - Continue sertraline  100 mg daily. - Continue trazodone  100 mg at bedtime.  7. Type II diabetes mellitus with peripheral autonomic neuropathy (HCC) -  A1c well-controlled, previously 7.1%, improved to 5.8 -  diet-controlled  8. Stage 3b chronic kidney  disease (HCC) -  Kidney function improved, eGFR increased from 24 to 30.  9. Gastroesophageal reflux disease without esophagitis -  Postprandial acid reflux, possibly stress-related. Managed with Protonix . - Continue Protonix  40 mg daily.  10. Granuloma annulare -  Rash on lower legs, thighs, and chest. Managed with triamcinolone . - Continue triamcinolone  cream. - Follow up with dermatologist.   11.  Chronic angle-closure glaucoma, bilateral -   Managed with latanoprost . Ophthalmologist follow-up scheduled. - Continue latanoprost  ophthalmic solution. - Attend follow-up with ophthalmologist.        Labs/tests ordered:   None   Return in about 3 months (around 12/27/2023).  Elara Cocke Medina-Vargas, NP

## 2023-09-29 ENCOUNTER — Encounter: Payer: Self-pay | Admitting: Professional Counselor

## 2023-09-29 ENCOUNTER — Ambulatory Visit: Admitting: Professional Counselor

## 2023-09-29 DIAGNOSIS — F411 Generalized anxiety disorder: Secondary | ICD-10-CM

## 2023-09-29 DIAGNOSIS — F330 Major depressive disorder, recurrent, mild: Secondary | ICD-10-CM

## 2023-09-29 NOTE — Progress Notes (Signed)
      [facility] Counselor/Therapist Progress Note  Patient ID: Abigail Oliver, MRN: [id number],    Date: 09/29/2023  Time Spent: 3:08 PM to 4:03 PM  Treatment Type: Individual Therapy  Reported Symptoms: Worries, sadness, dizziness when upset, fatigue, sleep deficit, trouble relaxing, frustration, interpersonal concerns, self-esteem concerns, caregiver concerns, phase of life concerns, health concerns  Mental Status Exam:  Appearance:   Casual     Behavior:  Appropriate, Sharing, and Motivated  Motor:  Normal  Speech/Language:   Clear and Coherent and Normal Rate  Affect:  Appropriate and Congruent  Mood:  normal  Thought process:  normal  Thought content:    WNL  Sensory/Perceptual disturbances:    WNL  Orientation:  oriented to person, place, time/date, and situation  Attention:  Good  Concentration:  Good  Memory:  WNL  Fund of knowledge:   Good  Insight:    Good  Judgment:   Good  Impulse Control:  Good   Risk Assessment: Danger to Self:  No Self-injurious Behavior: No Danger to Others: No Duty to Warn:no Physical Aggression / Violence:No  Access to Firearms a concern: No  Gang Involvement:No   Subjective: Patient presented to session to address concerns of anxiety and depression.  She reported mixed progress at this time.  Patient reported her mobility to have become improved by virtue of taking better care of herself recently.  She voiced that she has been paying attention to herself and not others whom she has a tendency to enable.  Patient voiced feeling empowered.  Counselor affirmed patient progress and proactive stance both intra and interpersonally.  Counselor also helped patient to identify ways in which she can resource compassion for others without enabling.  Counselor and patient discussed interpersonal effectiveness skills, and patient continued use of assertiveness.  Patient identified continuing to have symptoms as described above, however mostly  grateful for her implementation of major boundaries and ability to accept and persevere through difficult realities (family matters and conflicts).  Interventions: Assertiveness/Communication, Solution-Oriented/Positive Psychology, and Humanistic/Existential  Diagnosis:   ICD-10-CM   1. Generalized anxiety disorder  F41.1     2. Major depressive disorder, recurrent episode, mild (HCC)  F33.0       Plan: Patient is scheduled for follow-up; continue process work and developing coping skills.  STG between sessions to continue interpersonal effectiveness skills including assertiveness and boundary keeping, continue to prioritize self-care and support of husband.  Progress note was dictated with Dragon and reviewed for accuracy.  Abigail Oliver, [facility]

## 2023-10-07 ENCOUNTER — Encounter: Payer: Self-pay | Admitting: Professional Counselor

## 2023-10-07 ENCOUNTER — Ambulatory Visit: Admitting: Professional Counselor

## 2023-10-07 DIAGNOSIS — F331 Major depressive disorder, recurrent, moderate: Secondary | ICD-10-CM

## 2023-10-07 DIAGNOSIS — F411 Generalized anxiety disorder: Secondary | ICD-10-CM

## 2023-10-07 NOTE — Progress Notes (Signed)
      [facility] Counselor/Therapist Progress Note  Patient ID: Abigail Oliver, MRN: [id number],    Date: 10/07/2023  Time Spent: 10:15 AM to 11:12 AM  Treatment Type: Family with patient  Patient present to session with her daughter.  Reported Symptoms: Sadness, worries, restlessness, irritability, nervousness, frustration, tearfulness  Mental Status Exam:  Appearance:   Neat     Behavior:  Appropriate, Sharing, and Motivated  Motor:  Shuffling Gait  Speech/Language:   Clear and Coherent and Normal Rate  Affect:  Tearful  Mood:  anxious, depressed, and sad  Thought process:  normal  Thought content:    WNL  Sensory/Perceptual disturbances:    WNL  Orientation:  oriented to person, place, time/date, and situation  Attention:  Good  Concentration:  Good  Memory:  WNL  Fund of knowledge:   Good  Insight:    Good  Judgment:   Good  Impulse Control:  Good   Risk Assessment: Danger to Self:  No Self-injurious Behavior: No Danger to Others: No Duty to Warn:no Physical Aggression / Violence:No  Access to Firearms a concern: No  Gang Involvement:No   Subjective: Patient presented to session to address concerns of anxiety and depression.  Patient reported minimal progress at this time.  She presented to session with her daughter.  Patient shared regarding developments in daughter's life that caused patient, daughter, and family distress, concern, worry, fear.  Patient expressed her feelings around circumstances of concern, and her daughter responded to patient's concerns for her.  Counselor assisted patient and her daughter and communicating and understanding wanted others' perspectives, and in negotiating boundaries as a move forward in acutely challenging situation.  Counselor helped patient to voice and reinforce her sense of needing to prioritize her own needs, as daughter resources self-soothing and practices being mindful of her parents phase of life and limitations to  being of help at this time.  Counselor recommended counseling and/or support groups for family members.  Interventions: Assertiveness/Communication, Solution-Oriented/Positive Psychology, Humanistic/Existential, Insight-Oriented, and Family Systems  Diagnosis:   ICD-10-CM   1. Major depressive disorder, recurrent episode, moderate (HCC)  F33.1     2. Generalized anxiety disorder  F41.1       Plan: Patient is scheduled for follow-up; continue process work and developing coping skills.  STG between sessions for patient to continue to implement boundaries as necessary for holistic self protection including as relates health concerns exacerbated by over functioning for others.  Progress note was dictated with Dragon and reviewed for accuracy.  Abigail Oliver, [facility]

## 2023-10-10 ENCOUNTER — Ambulatory Visit

## 2023-10-14 ENCOUNTER — Other Ambulatory Visit: Payer: Self-pay | Admitting: Physician Assistant

## 2023-10-21 NOTE — Addendum Note (Signed)
 Addended by: Dorrine Gaudy T on: 10/21/2023 01:15 PM   Modules accepted: Level of Service

## 2023-10-24 ENCOUNTER — Ambulatory Visit: Attending: Cardiovascular Disease

## 2023-10-24 DIAGNOSIS — Z952 Presence of prosthetic heart valve: Secondary | ICD-10-CM | POA: Insufficient documentation

## 2023-10-24 DIAGNOSIS — Z7901 Long term (current) use of anticoagulants: Secondary | ICD-10-CM | POA: Insufficient documentation

## 2023-10-24 DIAGNOSIS — H04123 Dry eye syndrome of bilateral lacrimal glands: Secondary | ICD-10-CM | POA: Diagnosis not present

## 2023-10-24 DIAGNOSIS — H401134 Primary open-angle glaucoma, bilateral, indeterminate stage: Secondary | ICD-10-CM | POA: Diagnosis not present

## 2023-10-24 LAB — POCT INR: INR: 3.7 — AB (ref 2.0–3.0)

## 2023-10-24 LAB — HM DIABETES EYE EXAM

## 2023-10-24 NOTE — Patient Instructions (Signed)
 Description   Take 0.5 tablet today and then continue 0.5 tablet daily except for 1 tablet on Monday, Wednesday and Friday.    Repeat INR in 3 weeks.  Coumadin  Clinic [phone]

## 2023-10-27 ENCOUNTER — Encounter: Payer: Self-pay | Admitting: Professional Counselor

## 2023-10-27 ENCOUNTER — Ambulatory Visit: Admitting: Professional Counselor

## 2023-10-27 DIAGNOSIS — F331 Major depressive disorder, recurrent, moderate: Secondary | ICD-10-CM

## 2023-10-27 DIAGNOSIS — F411 Generalized anxiety disorder: Secondary | ICD-10-CM

## 2023-10-27 NOTE — Progress Notes (Signed)
      [facility] Counselor/Therapist Progress Note  Patient ID: Abigail Oliver, MRN: [id number],    Date: 10/27/2023  Time Spent: 2:13 PM - 3:14 PM   Treatment Type: Individual Therapy  Reported Symptoms: tearfulness, sadness, worries, stress, interpersonal concerns, family conflict, health concerns  Mental Status Exam:  Appearance:   Casual     Behavior:  Appropriate, Sharing, and Motivated  Motor:  Shuffling Gait unsteady walking per medical concern, however improved from prior sessions  Speech/Language:   Clear and Coherent and Normal Rate  Affect:  Depressed and Tearful  Mood:  anxious and sad  Thought process:  normal  Thought content:    WNL  Sensory/Perceptual disturbances:    WNL  Orientation:  oriented to person, place, time/date, and situation  Attention:  Good  Concentration:  Good  Memory:  WNL  Fund of knowledge:   Good  Insight:    Good  Judgment:   Good  Impulse Control:  Good   Risk Assessment: Danger to Self:  No Self-injurious Behavior: No Danger to Others: No Duty to Warn:no Physical Aggression / Violence:No  Access to Firearms a concern: No  Gang Involvement:No   Subjective: Patient presented to session to address concerns of anxiety and depression.  She reported mixed progress at this time.  Counselor and patient processed family session prior.  Patient reported trying to continue to maintain boundaries, in spite of unpredictable united front with husband.  Patient reflected on her past, including relationship with her mother, and patient having taking care of others across the lifespan.  Patient shared regarding her fluency in 4 languages and intention to go to college, but having to decline due to family finance needs.  Counselor actively listened, affirmed patient feelings and experience, held space for patient grief and loss, helped facilitate insight and meaning making into patient reflections and processing, and helped to reinforce patient's  strengths and gifts of spirit.  Interventions: Assertiveness/Communication, Solution-Oriented/Positive Psychology, Humanistic/Existential, and Insight-Oriented  Diagnosis:   ICD-10-CM   1. Major depressive disorder, recurrent episode, moderate (HCC)  F33.1     2. Generalized anxiety disorder  F41.1       Plan: Pt is scheduled for a follow-up; continue process work and developing coping skills. Pt STG between sessions to have conversation with son and husband around concern for her daughter and regarding boundaries she feels they should observe collectively so as to encourage improved outcome in behavior patterns impacting family well-being.  Abigail Oliver, [facility]

## 2023-11-03 ENCOUNTER — Ambulatory Visit: Admitting: Professional Counselor

## 2023-11-03 ENCOUNTER — Encounter: Payer: Self-pay | Admitting: Professional Counselor

## 2023-11-03 DIAGNOSIS — F331 Major depressive disorder, recurrent, moderate: Secondary | ICD-10-CM

## 2023-11-03 DIAGNOSIS — F411 Generalized anxiety disorder: Secondary | ICD-10-CM | POA: Diagnosis not present

## 2023-11-03 NOTE — Progress Notes (Signed)
      [facility] Counselor/Therapist Progress Note  Patient ID: Abigail Oliver, MRN: [id number],    Date: 11/03/2023  Time Spent: 2:12 PM - 3:07 PM   Treatment Type: Individual Therapy  Reported Symptoms: worries, stress, grief/loss, caregiver strain, interpersonal concerns, family conflict, sadness, low mood, restlessness  Mental Status Exam:  Appearance:   Neat     Behavior:  Appropriate, Sharing, and Motivated  Motor:  Normal  Speech/Language:   Clear and Coherent and Normal Rate  Affect:  Appropriate and Congruent  Mood:  normal  Thought process:  normal  Thought content:    WNL  Sensory/Perceptual disturbances:    WNL  Orientation:  oriented to person, place, time/date, and situation  Attention:  Good  Concentration:  Good  Memory:  WNL  Fund of knowledge:   Good  Insight:    Good  Judgment:   Good  Impulse Control:  Good   Risk Assessment: Danger to Self:  No Self-injurious Behavior: No Danger to Others: No Duty to Warn:no Physical Aggression / Violence:No  Access to Firearms a concern: No  Gang Involvement:No   Subjective: Patient presented to session to address concerns of anxiety and depression.  She reported mixed progress at this time.  Patient processed experience of grief and loss having lost a friend and a family pet.  She reflected on her upbringing, family life and experience of her mothers single parenthood.  She also reflected on her relationship with her husband over time, and navigating his disability and impact.  Counselor actively listened and held space for patient processing, affirmed patient feelings and experience, strengths including resiliency, and helped facilitate insight and meaning making.  Interventions: Humanistic/Existential and Insight-Oriented  Diagnosis:   ICD-10-CM   1. Major depressive disorder, recurrent episode, moderate (HCC)  F33.1     2. Generalized anxiety disorder  F41.1       Plan: Pt is scheduled for a follow-up;  continue process work and developing coping skills. STG between sessions to continue to implement boundaries with challenging family member, and to prioritize her health and care for husband.  [name], [facility]

## 2023-11-07 ENCOUNTER — Ambulatory Visit: Admitting: Professional Counselor

## 2023-11-09 ENCOUNTER — Other Ambulatory Visit: Payer: Self-pay | Admitting: Physician Assistant

## 2023-11-09 ENCOUNTER — Other Ambulatory Visit: Payer: Self-pay | Admitting: Adult Health

## 2023-11-09 DIAGNOSIS — I10 Essential (primary) hypertension: Secondary | ICD-10-CM

## 2023-11-11 NOTE — Telephone Encounter (Signed)
 Sent MyChart message, was a no show for last 2 appts

## 2023-11-14 ENCOUNTER — Ambulatory Visit: Attending: Cardiovascular Disease | Admitting: *Deleted

## 2023-11-14 DIAGNOSIS — Z952 Presence of prosthetic heart valve: Secondary | ICD-10-CM | POA: Insufficient documentation

## 2023-11-14 DIAGNOSIS — Z7901 Long term (current) use of anticoagulants: Secondary | ICD-10-CM | POA: Insufficient documentation

## 2023-11-14 LAB — POCT INR: INR: 3.2 — AB (ref 2.0–3.0)

## 2023-11-14 NOTE — Progress Notes (Signed)
Please see anticoagulation encounter.

## 2023-11-14 NOTE — Patient Instructions (Signed)
 Description   Continue taking warfarin 1/2 tablet daily except for 1 tablet on Monday, Wednesday and Friday.    Repeat INR in 4 weeks.  Coumadin  Clinic [phone]

## 2023-11-17 ENCOUNTER — Encounter: Payer: Self-pay | Admitting: Professional Counselor

## 2023-11-17 ENCOUNTER — Ambulatory Visit: Admitting: Professional Counselor

## 2023-11-17 DIAGNOSIS — F411 Generalized anxiety disorder: Secondary | ICD-10-CM | POA: Diagnosis not present

## 2023-11-17 DIAGNOSIS — F331 Major depressive disorder, recurrent, moderate: Secondary | ICD-10-CM

## 2023-11-17 NOTE — Progress Notes (Signed)
      [facility] Counselor/Therapist Progress Note  Patient ID: Abigail Oliver, MRN: [id number],    Date: 11/17/2023  Time Spent: 2:08 PM - 3:04 PM   Treatment Type: Individual Therapy  Reported Symptoms: sleeplessness, low appetite, worries, sadness, anhedonia, tearfulness, interpersonal concerns, caregiver strain, family high-stress circumstance, self care concerns, health concerns   Mental Status Exam:  Appearance:   Neat     Behavior:  Appropriate, Sharing, and Motivated  Motor:  Normal  Speech/Language:   Clear and Coherent and Normal Rate  Affect:  Tearful  Mood:  sad  Thought process:  normal  Thought content:    WNL  Sensory/Perceptual disturbances:    WNL  Orientation:  oriented to person, place, time/date, and situation  Attention:  Good  Concentration:  Good  Memory:  WNL  Fund of knowledge:   Good  Insight:    Good  Judgment:   Good  Impulse Control:  Good   Risk Assessment: Danger to Self:  No Self-injurious Behavior: No Danger to Others: No Duty to Warn:no Physical Aggression / Violence:No  Access to Firearms a concern: No  Gang Involvement:No   Subjective: Patient presented to session to address concerns of anxiety and depression.  She reported minimal progress at this time.  Patient reported family member court date set for early July and processed experience of family member trying to guilt patient into helping more than patient would like to.  Patient identified challenges with her heart, glaucoma, leg, diabetes and rash, for which she is receiving regular medical care, and inability to help any more for others than she has already.  Counselor reinforced patient decision to prioritize her self care and that of her husband, most importantly physically but emotionally as well.  Patient reported doing her best to maintain boundaries to protect herself.  She reported her husband to be increasingly agitated, and identified her coping skills around this  circumstance as well.  Counselor reinforced patient efforts and continued intention to resource her strengths and self advocacy.  Interventions: Assertiveness/Communication, Solution-Oriented/Positive Psychology, Humanistic/Existential, and Insight-Oriented  Diagnosis:   ICD-10-CM   1. Major depressive disorder, recurrent episode, moderate (HCC)  F33.1     2. Generalized anxiety disorder  F41.1       Plan: Pt is scheduled for a follow-up; continue process work and developing coping skills. STG between sessions for patient to focus on her health and to rest, to continue to maintain protective boundaries around her time, space, energies and other facets of life as relates family high-stress circumstances.  Abigail Oliver, [facility]

## 2023-11-23 ENCOUNTER — Telehealth: Payer: Self-pay | Admitting: Adult Health

## 2023-11-23 NOTE — Telephone Encounter (Signed)
 My apologies. Appt has been corrected.SABRAsch with Verneita 7/24

## 2023-11-23 NOTE — Telephone Encounter (Signed)
 Pt needs refill on Trazodone  and Zoloft . Needed sooner appt but wanted to come in, appt had to made on 8/22.

## 2023-11-23 NOTE — Telephone Encounter (Signed)
 Appt was made with [facility]. Patient sees Abigail Oliver.

## 2023-11-28 ENCOUNTER — Ambulatory Visit: Admitting: Cardiovascular Disease

## 2023-11-29 ENCOUNTER — Ambulatory Visit (INDEPENDENT_AMBULATORY_CARE_PROVIDER_SITE_OTHER): Admitting: Professional Counselor

## 2023-11-29 DIAGNOSIS — F332 Major depressive disorder, recurrent severe without psychotic features: Secondary | ICD-10-CM | POA: Diagnosis not present

## 2023-11-29 DIAGNOSIS — F411 Generalized anxiety disorder: Secondary | ICD-10-CM

## 2023-11-29 NOTE — Progress Notes (Signed)
      [facility] Counselor/Therapist Progress Note  Patient ID: AMAURI MEDELLIN, MRN: [id number],    Date: 11/29/2023  Time Spent: 3:03 PM   Treatment Type: Family with Patient  Patient presented to session with her son.  Reported Symptoms: tearfulness, worries, sadness, sleeplessness, low mood, anhedonia, fatigue, sense of overwhelm, family concern, interpersonal concerns, memory concerns, stress, phase of life concerns, health concerns, frustration, restlessness, trouble relaxing, irritability, catastrophic thinking, caregiver strain, grief/loss  Mental Status Exam:  Appearance:   Neat     Behavior:  Appropriate and Sharing  Motor:  Normal  Speech/Language:   Clear and Coherent and Normal Rate  Affect:  Depressed and Tearful  Mood:  depressed and sad  Thought process:  normal  Thought content:    WNL  Sensory/Perceptual disturbances:    WNL  Orientation:  oriented to person, place, time/date, and situation  Attention:  Good  Concentration:  Good  Memory:  WNL  Fund of knowledge:   Good  Insight:    Good  Judgment:   Good  Impulse Control:  Good   Risk Assessment: Danger to Self:  No Self-injurious Behavior: No Danger to Others: No Duty to Warn:no Physical Aggression / Violence:No  Access to Firearms a concern: No  Gang Involvement:No   Subjective: Patient presented to session with concerns of anxiety and depression.  She presented to session with her son.  She reported minimal progress at this time.  She reported to not be getting any sleep, to have worse depression and anxiety at night.  Counselor facilitated PHQ 9 and patient scored 21, GAD-7 and patient scored a 20.  Counselor encouraged patient to follow-up with prescribing provider.  Patient and her son processed experience of significant family stressor related to daughter/sister at this time, and processed experience of family system dynamics over time.  Patient identified needs for increased support including as  relates to memory concerns and fatigue, and has been feeling help, and patient's son voiced intention to help more.  Counselor affirmed patient feelings and experience, and reinforced patient resourcing of support system both professional and interpersonal.  Interventions: Solution-Oriented/Positive Psychology, Humanistic/Existential, Insight-Oriented, and Interpersonal  Diagnosis:   ICD-10-CM   1. Severe episode of recurrent major depressive disorder, without psychotic features (HCC)  F33.2     2. Generalized anxiety disorder  F41.1       Plan: Pt is scheduled for a follow-up; continue process work and developing coping skills. STG between session to put her self care and health carre needs first; follow-up with prescribing provider to assess sleeplessness and exacerbated depression symptoms; son to assist in support measures, including emotional and domestic/yard/driving support.  [name], [facility]

## 2023-11-30 ENCOUNTER — Other Ambulatory Visit: Payer: Self-pay | Admitting: Physician Assistant

## 2023-12-09 DIAGNOSIS — M8588 Other specified disorders of bone density and structure, other site: Secondary | ICD-10-CM | POA: Diagnosis not present

## 2023-12-09 DIAGNOSIS — N958 Other specified menopausal and perimenopausal disorders: Secondary | ICD-10-CM | POA: Diagnosis not present

## 2023-12-12 ENCOUNTER — Encounter: Payer: Self-pay | Admitting: Professional Counselor

## 2023-12-12 ENCOUNTER — Ambulatory Visit: Attending: Cardiovascular Disease | Admitting: *Deleted

## 2023-12-12 ENCOUNTER — Ambulatory Visit: Admitting: Professional Counselor

## 2023-12-12 DIAGNOSIS — Z952 Presence of prosthetic heart valve: Secondary | ICD-10-CM | POA: Diagnosis not present

## 2023-12-12 DIAGNOSIS — F331 Major depressive disorder, recurrent, moderate: Secondary | ICD-10-CM

## 2023-12-12 DIAGNOSIS — F411 Generalized anxiety disorder: Secondary | ICD-10-CM

## 2023-12-12 DIAGNOSIS — Z7901 Long term (current) use of anticoagulants: Secondary | ICD-10-CM | POA: Insufficient documentation

## 2023-12-12 LAB — POCT INR: INR: 1.3 — AB (ref 2.0–3.0)

## 2023-12-12 NOTE — Patient Instructions (Addendum)
 Description   Today take 1.5 tablets of warfarin and tomorrow take 1 tablet of warfarin then continue taking warfarin 1/2 tablet daily except for 1 tablet on Monday, Wednesday and Friday.    Repeat INR in 1 week.  Coumadin  Clinic [phone]

## 2023-12-12 NOTE — Progress Notes (Signed)
 INR 1.3; Please see anticoagulation encounter.

## 2023-12-12 NOTE — Progress Notes (Signed)
      [facility] Counselor/Therapist Progress Note  Patient ID: Abigail Oliver, MRN: [id number],    Date: 12/12/2023  Time Spent: 9:12 AM to 10:10 AM  Treatment Type: Individual Therapy  Reported Symptoms: sleeplessness, sadness, worries, health concerns, caregiver strain, stress, phase of life concerns, anhedonia, low mood, fatigue, anxiousness, trouble relaxing, family concerns  Mental Status Exam:  Appearance:   Casual     Behavior:  Appropriate and Sharing  Motor:  Shuffling Gait per mobility concerns  Speech/Language:   Clear and Coherent and Normal Rate  Affect:  Tearful  Mood:  sad  Thought process:  normal  Thought content:    WNL  Sensory/Perceptual disturbances:    WNL  Orientation:  oriented to person, place, time/date, and situation  Attention:  Good  Concentration:  Good  Memory:  WNL  Fund of knowledge:   Good  Insight:    Good  Judgment:   Good  Impulse Control:  Good   Risk Assessment: Danger to Self:  No Self-injurious Behavior: No Danger to Others: No Duty to Warn:no Physical Aggression / Violence:No  Access to Firearms a concern: No  Gang Involvement:No   Subjective: Patient presented to session to address concerns of anxiety depression.  She reported minimal progress at this time.  She reported to be having a very difficult time sleeping, and to be very sad about circumstances with her daughter, her and her husband's health, and her dog to be declining as well.  She also reported having lost her job, for her son to have taken a fall, and for her to feel overwhelmed with state of house including as relates belongings.  Counselor actively listened and affirmed patient feelings and experience, and assisted patient in resourcing, encouraging patient to look into senior services, volunteer work or classes, continue with medical care including follow-up with prescribing provider regarding sleep concerns, and assisted patient in identifying coping skills  toward increased relaxation and stress relief.  Interventions: Solution-Oriented/Positive Psychology, Humanistic/Existential, and Insight-Oriented, Supportive Therapy, Resourcing   Diagnosis:   ICD-10-CM   1. Major depressive disorder, recurrent episode, moderate (HCC)  F33.1     2. Generalized anxiety disorder  F41.1       Plan: Patient is scheduled for follow-up; continue process work and developing coping skills.  Short-term goal between sessions to consider receiving volunteer services through the senior center, and/or taking classes for social and creative outlet.  Continue to implement boundaries and prioritize self-care.  Follow-up with prescribing provider to address exacerbated sleeplessness.  Abigail Oliver, [facility]

## 2023-12-13 ENCOUNTER — Other Ambulatory Visit: Payer: Self-pay | Admitting: Adult Health

## 2023-12-13 ENCOUNTER — Other Ambulatory Visit: Payer: Self-pay | Admitting: Physician Assistant

## 2023-12-15 ENCOUNTER — Ambulatory Visit (INDEPENDENT_AMBULATORY_CARE_PROVIDER_SITE_OTHER): Admitting: Physician Assistant

## 2023-12-15 ENCOUNTER — Encounter: Payer: Self-pay | Admitting: Physician Assistant

## 2023-12-15 DIAGNOSIS — F411 Generalized anxiety disorder: Secondary | ICD-10-CM

## 2023-12-15 DIAGNOSIS — F5105 Insomnia due to other mental disorder: Secondary | ICD-10-CM

## 2023-12-15 DIAGNOSIS — F330 Major depressive disorder, recurrent, mild: Secondary | ICD-10-CM

## 2023-12-15 DIAGNOSIS — F99 Mental disorder, not otherwise specified: Secondary | ICD-10-CM | POA: Diagnosis not present

## 2023-12-15 MED ORDER — QUETIAPINE FUMARATE 25 MG PO TABS: 25.0000 mg | ORAL_TABLET | Freq: Every day | ORAL | 1 refills | Status: DC

## 2023-12-15 MED ORDER — SERTRALINE HCL 100 MG PO TABS: 150.0000 mg | ORAL_TABLET | Freq: Every day | ORAL | 1 refills | Status: DC

## 2023-12-15 NOTE — Progress Notes (Signed)
 [facility] Med Check  Patient ID: Abigail Oliver,  MRN: [id number]  PCP: Medina-Vargas, Monina C, NP  Date of Evaluation: 12/15/2023 Time spent:25 minutes  Chief Complaint:  Chief Complaint   Anxiety; Insomnia; Follow-up    HISTORY/CURRENT STATUS: HPI overdue for f/u appt  Trouble falling asleep and staying asleep.  Wakes up several times a night, usually stays awake 30 mins. Can't stop thinking about things.  Her daughter is going to jail next month for embezzling money, pt lost her job, was working part-time at [employer]. So pt is stressed to say the least. Her husband is being passive aggressive toward her b/c 'he doesn't know how to deal with their dtr.' Her sister is also causing stress.  She has taken multiple meds for insomnia over the years and nothing seems to help very much.  Energy and motivation are fair to good.  No extreme sadness, tearfulness, or feelings of hopelessness.  ADLs and personal hygiene are normal.   Denies any changes in concentration, making decisions, or remembering things.  Appetite has not changed.  Weight is stable.  No mania, delirium, AH/VH.  No SI/HI.  Denies dizziness, syncope, seizures, numbness, tingling, tremor, tics, unsteady gait, slurred speech, confusion. Denies muscle or joint pain, stiffness, or dystonia.  Individual Medical History/ Review of Systems: Changes? :No   Past medications for mental health diagnoses include: Zoloft , Ativan  didn't help, Cymbalta , Mirtzapine took 1 time and it didn't help and may have affected her kidneys,  Trazodone  hasn't helped at lower doses but at higher doses caused extreme drowsiness the next day. Ambien  caused nightmares.   Allergies: Atorvastatin , Dapagliflozin, Gabapentin , Simvastatin , Sulfa  antibiotics, and Iodinated contrast media  Current Medications:  Current Outpatient Medications:    QUEtiapine  (SEROQUEL ) 25 MG tablet, Take 1 tablet (25 mg total) by mouth at bedtime., Disp: 30 tablet, Rfl:  1   Ascorbic Acid  (VITAMIN C ) 1000 MG tablet, Take 1,000 mg by mouth every evening., Disp: , Rfl:    aspirin  EC 81 MG tablet, Take 81 mg by mouth every evening., Disp: , Rfl:    cyanocobalamin  (VITAMIN B12) 1000 MCG tablet, Take 1,000 mcg by mouth daily., Disp: , Rfl:    fluticasone  (FLONASE ) 50 MCG/ACT nasal spray, Place 2 sprays into both nostrils as needed for allergies or rhinitis., Disp: , Rfl:    folic acid  (FOLVITE ) 800 MCG tablet, Take 800 mcg by mouth daily., Disp: , Rfl:    latanoprost  (XALATAN ) 0.005 % ophthalmic solution, PLACE 1 DROP INTO BOTH EYES AT BEDTIME., Disp: 2.5 mL, Rfl: 2   metoprolol  succinate (TOPROL  XL) 50 MG 24 hr tablet, Take 1 tablet (50 mg total) by mouth daily. Take with or immediately following a meal., Disp: 90 tablet, Rfl: 3   Multiple Vitamin (MULTIVITAMIN WITH MINERALS) TABS tablet, Take 2 tablets by mouth daily., Disp: , Rfl:    nitroGLYCERIN  (NITROSTAT ) 0.4 MG SL tablet, Place 1 tablet (0.4 mg total) under the tongue every 5 (five) minutes as needed for chest pain., Disp: 20 tablet, Rfl: 12   pantoprazole  (PROTONIX ) 40 MG tablet, Take 1 tablet (40 mg total) by mouth daily., Disp: , Rfl:    potassium chloride  SA (KLOR-CON  M20) 20 MEQ tablet, Take 1 tablet (20 mEq total) by mouth daily., Disp: , Rfl:    Psyllium (METAMUCIL) 0.36 g CAPS, Take 1 tablet by mouth daily as needed., Disp: , Rfl:    rosuvastatin  (CRESTOR ) 20 MG tablet, Take 1 tablet (20 mg total) by mouth every evening., Disp:  90 tablet, Rfl: 1   sertraline  (ZOLOFT ) 100 MG tablet, Take 1.5 tablets (150 mg total) by mouth daily., Disp: 45 tablet, Rfl: 1   torsemide  (DEMADEX ) 20 MG tablet, Take 1 tablet (20 mg total) by mouth daily. increase her Torsemide  to 20mg  twice daily for 3 days THEN BACK TO ONCE DAILY, Disp: , Rfl:    valsartan  (DIOVAN ) 40 MG tablet, TAKE 1 TABLET BY MOUTH DAILY, Disp: 90 tablet, Rfl: 2   warfarin (COUMADIN ) 3 MG tablet, TAKE 1/2 TO 1 TABLET BY MOUTH DAILY AS DIRECTED (Patient  taking differently: Take 1.5-3 mg by mouth as directed. TAKE 1 TABLET (3 MG) ON Mon,Wed,Fri,Sun & TAKE 1/2 TABLET (1.5 MG) on Tues,Thurs,Sat), Disp: 90 tablet, Rfl: 0 Medication Side Effects: none  Family Medical/ Social History: Changes? Yes see HPI  MENTAL HEALTH EXAM:  There were no vitals taken for this visit.There is no height or weight on file to calculate BMI.  General Appearance: Casual and Well Groomed  Eye Contact:  Good  Speech:  Clear and Coherent and Normal Rate  Volume:  Normal  Mood:  Euthymic  Affect:  Congruent  Thought Process:  Goal Directed and Descriptions of Associations: Circumstantial  Orientation:  Full (Time, Place, and Person)  Thought Content: Logical   Suicidal Thoughts:  No  Homicidal Thoughts:  No  Memory:  WNL  Judgement:  Good  Insight:  Good  Psychomotor Activity:  Walks slowly with a four-pronged cane  Concentration:  Concentration: Good  Recall:  Good  Fund of Knowledge: Good  Language: Good  Assets:  Desire for Improvement Financial Resources/Insurance Housing Transportation Vocational/Educational  ADL's:  Intact  Cognition: WNL  Prognosis:  Good   DIAGNOSES:    ICD-10-CM   1. Insomnia due to other mental disorder  F51.05    F99     2. Generalized anxiety disorder  F41.1     3. Major depressive disorder, recurrent episode, mild (HCC)  F33.0      Receiving Psychotherapy: Yes with Cato Sprang, [facility]  RECOMMENDATIONS:   PDMP reviewed.  Ativan  filled 06/24/2023. I provided approximately 25 minutes of face to face time during this encounter, including time spent before and after the visit in records review, medical decision making, counseling pertinent to today's visit, and charting.   We discussed sleep hygiene.  We both prefer not to use a controlled substance although Sonata may be a good choice for her.  If we need to we can try that in the future at the lowest possible dose.  I would first of all recommend low-dose Seroquel .   Explained benefits, risk and side effects and she would like to try it.  Continue trazodone .  Start Seroquel  25 mg, 1 p.o. nightly as needed.  If she is not sleeping well after 1 week it is okay to increase to 1.5 pills.  If she needs to go higher than that she should call.   Increase Zoloft  100 mg to 1.5 pills daily to help with situational anxiety and depression. Continue therapy with Cato Sprang, [facility].  Return in 6 to 8 weeks.  Verneita Cooks, PA-C

## 2023-12-16 ENCOUNTER — Ambulatory Visit: Admitting: Professional Counselor

## 2023-12-19 ENCOUNTER — Ambulatory Visit: Attending: Cardiovascular Disease

## 2023-12-19 DIAGNOSIS — Z952 Presence of prosthetic heart valve: Secondary | ICD-10-CM | POA: Insufficient documentation

## 2023-12-19 DIAGNOSIS — Z7901 Long term (current) use of anticoagulants: Secondary | ICD-10-CM | POA: Diagnosis not present

## 2023-12-19 LAB — POCT INR: INR: 2.1 (ref 2.0–3.0)

## 2023-12-19 NOTE — Patient Instructions (Signed)
 Today take 2 tablets of warfarin then continue taking warfarin 1/2 tablet daily except for 1 tablet on Monday, Wednesday and Friday.    Repeat INR in 2 weeks.  Coumadin  Clinic [phone]

## 2023-12-19 NOTE — Progress Notes (Signed)
 INR 2.1 Please see anticoagulation encounter

## 2023-12-23 ENCOUNTER — Ambulatory Visit: Admitting: Professional Counselor

## 2023-12-23 ENCOUNTER — Encounter: Payer: Self-pay | Admitting: Professional Counselor

## 2023-12-23 DIAGNOSIS — F411 Generalized anxiety disorder: Secondary | ICD-10-CM | POA: Diagnosis not present

## 2023-12-23 DIAGNOSIS — F331 Major depressive disorder, recurrent, moderate: Secondary | ICD-10-CM | POA: Diagnosis not present

## 2023-12-23 NOTE — Progress Notes (Signed)
      [facility] Counselor/Therapist Progress Note  Patient ID: Abigail Oliver, MRN: [id number],    Date: 12/23/2023  Time Spent: 1:06 PM to 2:10 PM  Treatment Type: Individual Therapy  Reported Symptoms: worries, sadness, low mood, fatigue, stress, interpersonal concerns health concerns, phase of life concerns  Mental Status Exam:  Appearance:   Casual     Behavior:  Appropriate, Sharing, and Motivated  Motor:  Normal - pt mobility improved  Speech/Language:   Clear and Coherent and Normal Rate  Affect:  Appropriate and Congruent  Mood:  normal  Thought process:  normal  Thought content:    WNL  Sensory/Perceptual disturbances:    WNL  Orientation:  oriented to person, place, time/date, and situation  Attention:  Good  Concentration:  Good  Memory:  WNL  Fund of knowledge:   Good  Insight:    Good  Judgment:   Good  Impulse Control:  Good   Risk Assessment: Danger to Self:  No Self-injurious Behavior: No Danger to Others: No Duty to Warn:no Physical Aggression / Violence:No  Access to Firearms a concern: No  Gang Involvement:No   Subjective: Patient presented to session to address concerns of depression and anxiety.  She reported mixed progress at this time.  She reported her mobility to have improved.  She reported Seroquel  to be helping her sleep, however reported a difficult transition taking it at first, which she reported as resolved.  Patient voiced worry regarding interactions of medication with other medications she takes, and counselor encouraged patient to have follow-up with prescribing provider about these concerns.  Patient reported her other pet dog is having died, and processed experience of her daughter's updates in challenging circumstances in her life.  She processed experience of her daughter's history of challenging behaviors, and impact on family wellbeing.  Patient identified having an increase in acceptance and inner calm about her daughter's  situation.  Counselor actively listened, helped facilitate insight, reinforced patient strengths and growth and healing, and continue to encourage patient focus on self-care.  Interventions: Assertiveness/Communication, Solution-Oriented/Positive Psychology, Humanistic/Existential, and Insight-Oriented  Diagnosis:   ICD-10-CM   1. Major depressive disorder, recurrent episode, moderate (HCC)  F33.1     2. Generalized anxiety disorder  F41.1       Plan: Patient is scheduled for follow-up; continue process work and developing coping skills.  STG between sessions for patient to continue to resource her sense of acceptance, and nurture sense of inner peace/calm by prioritizing self-care and healthy coping mechanisms.  Discussed medication concerns with prescribing provider.  Abigail Oliver, [facility]

## 2023-12-27 ENCOUNTER — Other Ambulatory Visit: Payer: Self-pay | Admitting: Physician Assistant

## 2023-12-30 ENCOUNTER — Ambulatory Visit: Admitting: Professional Counselor

## 2024-01-01 NOTE — Progress Notes (Signed)
 [facility] clinic  Provider:  Jereld Serum DNP  Code Status:  Full Code  Goals of Care:     07/15/2023    2:47 PM  Advanced Directives  Does Patient Have a Medical Advance Directive? Yes  Type of Estate agent of [place];Living will  Copy of Healthcare Power of Attorney in Chart? No - copy requested     Chief Complaint  Patient presents with   Medical Management of Chronic Issues    3 months    Discussed the use of AI scribe software for clinical note transcription with the patient, who gave verbal consent to proceed.  HPI: Patient is a 78 y.o. female seen today for an a 11-month follow up of chronic medical issues. She was accompanied today by her husband.  She continues to follow up with her nephrologist for chronic kidney disease stage 3B. A urine microalbumin test is needed today.  She experiences ongoing depression, with medication recently changed from trazodone  to Seroquel , 25 mg at bedtime, which is not improving her sleep. She takes Seroquel  at 10 PM but does not fall asleep until 1:45 AM, waking up every hour. She also takes sertraline  (Zoloft ) 150 mg daily in the morning for depression, recently increased from 100 mg. Her depression remains unchanged and is exacerbated by stress related to her daughter's legal issues.  She has a history of aortic valve replacement and is on Coumadin , following up with the Coumadin  clinic. She takes 3 mg on Monday, Wednesday, and Friday, and 1.5 mg on other days. She also takes aspirin  81 mg daily for coronary artery disease and reports no chest pain.  For hypertension, she takes valsartan  40 mg daily and metoprolol  succinate 50 mg daily. She also has chronic diastolic heart failure but reports no shortness of breath or leg swelling. She takes torsemide  20 mg daily and potassium 20 MEQ daily in the morning.  She manages type 2 diabetes mellitus with diet control. Her last A1c was 7.1% in January 2025. She does not  currently monitor her blood sugar at home but is open to receiving a kit for finger sticks.  She takes rosuvastatin  20 mg in the evening for mixed hyperlipidemia.  She has rashes under bilateral breasts.  No chest pain, shortness of breath, or leg swelling. She moves her bowels daily.    Past Medical History:  Diagnosis Date   Anemia    Anxiety    Aortic stenosis    a. Now has St Jude mechanical aortic valve (6/00). b. Echo (6/15) with EF 60-65%, mechanical aortic valve with mean gradient 27 mmHg.   Arthritis    a. Possible c-spine arthritis with pain down left arm.     Carotid artery disease (HCC)    a. Carotid dopplers (7/16) with 40-59% BICA stenosis.     Chronic angle-closure glaucoma(365.23)    Chronic diastolic CHF (congestive heart failure) (HCC)    a.  RHC (7/15) with mean RA 2, PA 22/11, mean PCWP 9, CI 3.79.   Contrast media allergy    Coronary atherosclerosis of native coronary artery    a. CABG at time of AVR in 6/00 with RIMA-RCA. b. Abnl nuc 11/2013 -> LHC (7/15) with patent RIMA-RCA, 80% mRCA, 80% pLAD with FFR 0.73, treated with DES to pLAD.   Depression    Essential hypertension    GERD (gastroesophageal reflux disease)    Hyperlipidemia    Low back pain    Neuromuscular disorder (HCC)    neuropathy  Peripheral vascular disease (HCC)    a, H/o Right SFA stent. b. Peripheral arterial dopplers (7/16) with right SFA stent patent.     PONV (postoperative nausea and vomiting)    N&V   Type II diabetes mellitus (HCC)     Past Surgical History:  Procedure Laterality Date   BILATERAL OOPHORECTOMY  05/24/1985   BREAST BIOPSY  05/24/2010   left   CARDIAC CATHETERIZATION  01/14/1999   normal LV function, severe aortic stenosis; 80% and 70% stenosis in RCA; mild 20% distal norrowing in L main with 20% proximal LAD stenosis, 40% diagonal stenosis and 20% proximal circumflex stenosis   CARDIAC VALVE REPLACEMENT  05/24/1998   aortic valve    CARDIOVASCULAR STRESS  TEST  08/25/2011   R/L MV - EF 72%; no scintigraphic evidence of inducible MI; normal perfusionTID of 1.25 elevated - could indicate small vessle subendocardial ischemia; EKG NSR at 66, non diagnostic for ischemia   CARPAL TUNNEL RELEASE  05/25/1987   bilaterally   CATARACT EXTRACTION W/ INTRAOCULAR LENS  IMPLANT, BILATERAL  ~ 2007   CESAREAN SECTION  1969; 1971   CHOLECYSTECTOMY  05/24/1988   COLONOSCOPY N/A 10/27/2012   Procedure: COLONOSCOPY;  Surgeon: Belvie JONETTA Just, MD;  Location: [facility];  Service: Endoscopy;  Laterality: N/A;   CORONARY ARTERY BYPASS GRAFT  05/24/1998   RIMA to RCA   CORONARY/GRAFT ANGIOGRAPHY N/A 09/07/2021   Procedure: CORONARY/GRAFT ANGIOGRAPHY;  Surgeon: Wendel Lurena POUR, MD;  Location: [facility];  Service: Cardiovascular;  Laterality: N/A;   DOPPLER ECHOCARDIOGRAPHY  03/29/2012   EF >55%; mild concentric LVH; stage 1 diastolic dysfunction, elevated LV filling pressure, dilated LA; MAC mild MR; St Jude AVR peak and mean gradients of and ; transvalvular gradients have increased (prev 23 and 14 respectively)   ESOPHAGOGASTRODUODENOSCOPY N/A 10/27/2012   Procedure: ESOPHAGOGASTRODUODENOSCOPY (EGD);  Surgeon: Belvie JONETTA Just, MD;  Location: [facility];  Service: Endoscopy;  Laterality: N/A;   FEMORAL ARTERY STENT  09/20/2011   6 x 40 Smart Nitinol self-expanding stent placed;  10/15/2031 -R SFA stent open and patent w/o evidence of restenosis   FRACTIONAL FLOW RESERVE WIRE  12/14/2013   Procedure: FRACTIONAL FLOW RESERVE WIRE;  Surgeon: Ezra GORMAN Shuck, MD;  Location: [facility] CATH LAB;  Service: Cardiovascular;;   KNEE SURGERY Left    LACERATION REPAIR     right hand   LEFT AND RIGHT HEART CATHETERIZATION WITH CORONARY ANGIOGRAM N/A 12/14/2013   Procedure: LEFT AND RIGHT HEART CATHETERIZATION WITH CORONARY ANGIOGRAM;  Surgeon: Ezra GORMAN Shuck, MD;  Location: [facility] CATH LAB;  Service: Cardiovascular;  Laterality: N/A;   LOWER EXTREMITY ANGIOGRAM N/A  09/20/2011   Procedure: LOWER EXTREMITY ANGIOGRAM;  Surgeon: Dorn JINNY Lesches, MD;  Location: [facility] CATH LAB;  Service: Cardiovascular;  Laterality: N/A;   LYMPH NODE BIOPSY  06/25/2011   core needle on 5   needle biopsy  05/25/2007   on ankles for nerve damage   NEUROPLASTY / TRANSPOSITION ULNAR NERVE AT ELBOW     right   ORIF FEMUR FRACTURE Left 09/09/2021   Procedure: OPEN REDUCTION INTERNAL FIXATION (ORIF) DISTAL FEMUR FRACTURE;  Surgeon: Kendal Franky SQUIBB, MD;  Location: [facility] OR;  Service: Orthopedics;  Laterality: Left;   PERCUTANEOUS CORONARY STENT INTERVENTION (PCI-S)  12/14/2013   Procedure: PERCUTANEOUS CORONARY STENT INTERVENTION (PCI-S);  Surgeon: Ezra GORMAN Shuck, MD;  Location: [facility] CATH LAB;  Service: Cardiovascular;;  Prox LAD 3.00x12 Promus DES    PERIPHERAL ARTERIAL STENT GRAFT  09/20/2011  right SFA   REFRACTIVE SURGERY  ` 2004   for glaucoma   TONSILLECTOMY  05/24/1966   TRIGGER FINGER RELEASE Left 12/04/2015   Procedure: LEFT RING FINGER TRIGGER RELEASE;  Surgeon: Franky Curia, MD;  Location: [facility];  Service: Orthopedics;  Laterality: Left;   VAGINAL HYSTERECTOMY  05/25/1983   Fibroids    Allergies  Allergen Reactions   Atorvastatin      Muscle pain   Dapagliflozin     Other reaction(s): nausea   Gabapentin      Other reaction(s): stomach issues   Simvastatin  Other (See Comments)    Muscle pain   Sulfa  Antibiotics     unknown   Iodinated Contrast Media Rash     Red rash after cardiac cath 1 wk ago, ? Contrast allergy, requires 13 hr prep now per [email or web address]      Outpatient Encounter Medications as of 01/02/2024  Medication Sig   Ascorbic Acid  (VITAMIN C ) 1000 MG tablet Take 1,000 mg by mouth every evening.   aspirin  EC 81 MG tablet Take 81 mg by mouth every evening.   Blood Glucose Monitoring Suppl DEVI 1 each by Does not apply route daily. May substitute to any manufacturer covered by patient's insurance.   cyanocobalamin   (VITAMIN B12) 1000 MCG tablet Take 1,000 mcg by mouth daily.   fluticasone  (FLONASE ) 50 MCG/ACT nasal spray Place 2 sprays into both nostrils as needed for allergies or rhinitis.   folic acid  (FOLVITE ) 800 MCG tablet Take 800 mcg by mouth daily.   Glucose Blood (BLOOD GLUCOSE TEST STRIPS) STRP 1 each by In Vitro route daily. May substitute to any manufacturer covered by patient's insurance.   Lancet Device MISC 1 each by Does not apply route daily. May substitute to any manufacturer covered by patient's insurance.   Lancets Misc. MISC 1 each by Does not apply route daily. May substitute to any manufacturer covered by patient's insurance.   latanoprost  (XALATAN ) 0.005 % ophthalmic solution PLACE 1 DROP INTO BOTH EYES AT BEDTIME.   metoprolol  succinate (TOPROL  XL) 50 MG 24 hr tablet Take 1 tablet (50 mg total) by mouth daily. Take with or immediately following a meal.   Multiple Vitamin (MULTIVITAMIN WITH MINERALS) TABS tablet Take 2 tablets by mouth daily.   nitroGLYCERIN  (NITROSTAT ) 0.4 MG SL tablet Place 1 tablet (0.4 mg total) under the tongue every 5 (five) minutes as needed for chest pain.   nystatin  cream (MYCOSTATIN ) Apply 1 Application topically 2 (two) times daily for 21 days.   pantoprazole  (PROTONIX ) 40 MG tablet Take 1 tablet (40 mg total) by mouth daily.   potassium chloride  SA (KLOR-CON  M20) 20 MEQ tablet Take 1 tablet (20 mEq total) by mouth daily.   Psyllium (METAMUCIL) 0.36 g CAPS Take 1 tablet by mouth daily as needed.   QUEtiapine  (SEROQUEL ) 25 MG tablet Take 1 tablet (25 mg total) by mouth at bedtime.   rosuvastatin  (CRESTOR ) 20 MG tablet Take 1 tablet (20 mg total) by mouth every evening.   sertraline  (ZOLOFT ) 100 MG tablet Take 1.5 tablets (150 mg total) by mouth daily.   torsemide  (DEMADEX ) 20 MG tablet Take 1 tablet (20 mg total) by mouth daily. increase her Torsemide  to 20mg  twice daily for 3 days THEN BACK TO ONCE DAILY   valsartan  (DIOVAN ) 40 MG tablet TAKE 1 TABLET BY  MOUTH DAILY   warfarin (COUMADIN ) 3 MG tablet TAKE 1/2 TO 1 TABLET BY MOUTH DAILY AS DIRECTED (Patient taking differently: Take 1.5-3 mg by mouth  as directed. TAKE 1 TABLET (3 MG) ON Mon,Wed,Fri,Sun & TAKE 1/2 TABLET (1.5 MG) on Tues,Thurs,Sat)   [DISCONTINUED] sertraline  (ZOLOFT ) 100 MG tablet TAKE 1 TABLET BY MOUTH DAILY   [DISCONTINUED] traZODone  (DESYREL ) 100 MG tablet TAKE A HALF TO 2 TABLETS BY MOUTH AT BEDTIME AS NEEDED FOR SLEEP   No facility-administered encounter medications on file as of 01/02/2024.    Review of Systems:  Review of Systems  Constitutional:  Negative for appetite change, chills, fatigue and fever.  HENT:  Negative for congestion, hearing loss, rhinorrhea and sore throat.   Eyes: Negative.   Respiratory:  Negative for cough, shortness of breath and wheezing.   Cardiovascular:  Negative for chest pain, palpitations and leg swelling.  Gastrointestinal:  Negative for abdominal pain, constipation, diarrhea, nausea and vomiting.  Genitourinary:  Negative for dysuria.  Musculoskeletal:  Negative for arthralgias, back pain and myalgias.  Skin:  Positive for rash. Negative for color change and wound.  Neurological:  Negative for dizziness, weakness and headaches.  Psychiatric/Behavioral:  Negative for behavioral problems. The patient is not nervous/anxious.     Health Maintenance  Topic Date Due   HEMOGLOBIN A1C  11/23/2023   INFLUENZA VACCINE  01/23/2024 (Originally 12/23/2023)   COVID-19 Vaccine (7 - 2024-25 season) 01/23/2024 (Originally 01/23/2023)   Zoster Vaccines- Shingrix (1 of 2) 01/22/2025 (Originally 09/03/1964)   FOOT EXAM  02/16/2024   Medicare Annual Wellness (AWV)  05/26/2024   Diabetic kidney evaluation - Urine ACR  06/23/2024   Diabetic kidney evaluation - eGFR measurement  07/14/2024   OPHTHALMOLOGY EXAM  10/23/2024   DTaP/Tdap/Td (4 - Td or Tdap) 03/16/2026   Pneumococcal Vaccine: 50+ Years  Completed   DEXA SCAN  Completed   Hepatitis C Screening   Completed   Hepatitis B Vaccines  Aged Out   HPV VACCINES  Aged Out   Meningococcal B Vaccine  Aged Out   Colonoscopy  Discontinued    Physical Exam: Vitals:   01/02/24 1019  BP: 124/68  Pulse: 86  Resp: 18  Temp: (!) 96.1 F (35.6 C)  SpO2: 96%  Weight: 171 lb 6.4 oz (77.7 kg)  Height: 5' 6 (1.676 m)   Body mass index is 27.66 kg/m. Physical Exam Constitutional:      Appearance: Normal appearance.  HENT:     Head: Normocephalic and atraumatic.     Nose: Nose normal.     Mouth/Throat:     Mouth: Mucous membranes are moist.  Eyes:     Conjunctiva/sclera: Conjunctivae normal.  Cardiovascular:     Rate and Rhythm: Normal rate and regular rhythm.  Pulmonary:     Effort: Pulmonary effort is normal.     Breath sounds: Normal breath sounds.  Abdominal:     General: Bowel sounds are normal.     Palpations: Abdomen is soft.  Musculoskeletal:        General: Normal range of motion.     Cervical back: Normal range of motion.  Skin:    General: Skin is warm and dry.     Findings: Erythema present.     Comments: Erythematous rashes under bilateral breasts  Neurological:     General: No focal deficit present.     Mental Status: She is alert and oriented to person, place, and time.  Psychiatric:        Mood and Affect: Mood normal.        Behavior: Behavior normal.        Thought Content: Thought content  normal.        Judgment: Judgment normal.     Labs reviewed: Basic Metabolic Panel: Recent Labs    06/24/23 1331 07/05/23 1529 07/15/23 1539  NA 138 137 138  K 3.9 4.5 4.3  CL 101 98 102  CO2 26 28 26   GLUCOSE 241* 173* 234*  BUN 35* 66* 48*  CREATININE 1.49* 2.08* 1.71*  CALCIUM  9.4 10.1 9.4   Liver Function Tests: Recent Labs    01/10/23 0000 03/30/23 1805  AST 20 38  ALT 11 27  ALKPHOS 80 64  BILITOT  --  0.9  PROT  --  7.2  ALBUMIN 4.6 4.0   Recent Labs    03/30/23 1805 03/31/23 0441  LIPASE 54* 42   No results for input(s): AMMONIA  in the last 8760 hours. CBC: Recent Labs    01/10/23 0000 02/16/23 0936 03/30/23 1805 03/31/23 0441 04/01/23 0346 06/24/23 1201  WBC 6.2 4.8   < > 6.9 5.1 5.3  NEUTROABS 3.50 2,818  --   --   --  3,286  HGB 10.5* 9.8*   < > 9.6* 8.9* 10.2*  HCT 32* 31.6*   < > 30.4* 28.9* 32.3*  MCV  --  88.8   < > 90.5 87.3 87.8  PLT 128* 113*   < > 103* 84* 114*   < > = values in this interval not displayed.   Lipid Panel: Recent Labs    02/16/23 0936 05/26/23 1041  CHOL 167 145  HDL 46* 41*  LDLCALC 83 71  TRIG 286* 253*  CHOLHDL 3.6 3.5   Lab Results  Component Value Date   HGBA1C 7.1 (H) 05/26/2023    Procedures since last visit: No results found.  Assessment/Plan  1. Type 2 diabetes mellitus with stage 3b chronic kidney disease, without long-term current use of insulin  (HCC) (Primary) -  managed with diet control. Last HbA1c was 7.1% in January 2025. - Hemoglobin A1C - Microalbumin/Creatinine Ratio, Urine - Blood Glucose Monitoring Suppl DEVI; 1 each by Does not apply route daily. May substitute to any manufacturer covered by patient's insurance.  Dispense: 1 each; Refill: 0 - Glucose Blood (BLOOD GLUCOSE TEST STRIPS) STRP; 1 each by In Vitro route daily. May substitute to any manufacturer covered by patient's insurance.  Dispense: 100 strip; Refill: 3 - Lancet Device MISC; 1 each by Does not apply route daily. May substitute to any manufacturer covered by patient's insurance.  Dispense: 1 each; Refill: 0 - Lancets Misc. MISC; 1 each by Does not apply route daily. May substitute to any manufacturer covered by patient's insurance.  Dispense: 100 each; Refill: 3  2. Benign hypertension with chronic kidney disease -  Blood pressure is 124/68 mmHg. - Continue valsartan  40 mg daily. - Continue metoprolol  succinate 50 mg daily. - Comprehensive metabolic panel  3. Stage 3b chronic kidney disease (HCC) -  stable. Continues nephrology follow-up.  4. Chronic recurrent major  depressive disorder (HCC) -  Current medications include sertraline  150 mg daily and quetiapine  25 mg at bedtime. Reports poor sleep and persistent depressive symptoms. Stress related to daughter's legal issues may be contributing. - Advise to contact psychiatrist to discuss increasing quetiapine  dosage  5. Coronary artery disease involving native heart without angina pectoris, unspecified vessel or lesion type -   stable. No current chest pain reported. -  continue NTG PRN  6. Chronic diastolic heart failure (HCC) -  stable. No shortness of breath or leg swelling reported. - Continue torsemide   20 mg daily. - Continue potassium 20 MEQ daily.  7. Mixed hyperlipidemia -  managed with rosuvastatin  - Lipid panel  8. History of aortic valve replacement -  Continues on warfarin with INR monitoring at Coumadin  clinic.  9. Candidal skin infection -  Intertrigo of bilateral breasts present. - Prescribe nystatin  ointment for intertrigo.  - nystatin  cream (MYCOSTATIN ); Apply 1 Application topically 2 (two) times daily for 21 days.  Dispense: 42 g; Refill: 0      Labs/tests ordered: A1c, lipid panel, urine microalbumin creatinine ratio, CMP   Return in about 3 months (around 04/03/2024).  Gearold Wainer Medina-Vargas, NP

## 2024-01-02 ENCOUNTER — Encounter: Payer: Self-pay | Admitting: Adult Health

## 2024-01-02 ENCOUNTER — Ambulatory Visit: Admitting: Professional Counselor

## 2024-01-02 ENCOUNTER — Ambulatory Visit (INDEPENDENT_AMBULATORY_CARE_PROVIDER_SITE_OTHER): Admitting: Adult Health

## 2024-01-02 VITALS — BP 124/68 | HR 86 | Temp 96.1°F | Resp 18 | Ht 66.0 in | Wt 171.4 lb

## 2024-01-02 DIAGNOSIS — I129 Hypertensive chronic kidney disease with stage 1 through stage 4 chronic kidney disease, or unspecified chronic kidney disease: Secondary | ICD-10-CM

## 2024-01-02 DIAGNOSIS — N1832 Chronic kidney disease, stage 3b: Secondary | ICD-10-CM

## 2024-01-02 DIAGNOSIS — I2510 Atherosclerotic heart disease of native coronary artery without angina pectoris: Secondary | ICD-10-CM | POA: Diagnosis not present

## 2024-01-02 DIAGNOSIS — E1122 Type 2 diabetes mellitus with diabetic chronic kidney disease: Secondary | ICD-10-CM

## 2024-01-02 DIAGNOSIS — I5032 Chronic diastolic (congestive) heart failure: Secondary | ICD-10-CM

## 2024-01-02 DIAGNOSIS — E782 Mixed hyperlipidemia: Secondary | ICD-10-CM | POA: Diagnosis not present

## 2024-01-02 DIAGNOSIS — Z952 Presence of prosthetic heart valve: Secondary | ICD-10-CM

## 2024-01-02 DIAGNOSIS — F339 Major depressive disorder, recurrent, unspecified: Secondary | ICD-10-CM

## 2024-01-02 DIAGNOSIS — F5101 Primary insomnia: Secondary | ICD-10-CM

## 2024-01-02 DIAGNOSIS — B372 Candidiasis of skin and nail: Secondary | ICD-10-CM | POA: Diagnosis not present

## 2024-01-02 MED ORDER — BLOOD GLUCOSE TEST VI STRP: 1.0000 | ORAL_STRIP | Freq: Every day | 3 refills | Status: AC

## 2024-01-02 MED ORDER — LANCETS MISC. MISC: 1.0000 | Freq: Every day | 3 refills | Status: DC

## 2024-01-02 MED ORDER — LANCET DEVICE MISC: 1.0000 | Freq: Every day | 0 refills | Status: AC

## 2024-01-02 MED ORDER — NYSTATIN 100000 UNIT/GM EX CREA
1.0000 | TOPICAL_CREAM | Freq: Two times a day (BID) | CUTANEOUS | 0 refills | Status: AC
Start: 2024-01-02 — End: 2024-01-23

## 2024-01-02 MED ORDER — BLOOD GLUCOSE MONITORING SUPPL DEVI: 1.0000 | Freq: Every day | 0 refills | Status: AC

## 2024-01-03 ENCOUNTER — Encounter (HOSPITAL_COMMUNITY): Payer: Self-pay

## 2024-01-03 LAB — LIPID PANEL
Cholesterol: 132 mg/dL (ref <200)
HDL: 45 mg/dL — ABNORMAL LOW (ref >=50)
LDL Cholesterol (Calc): 60 mg/dL
Non-HDL Cholesterol (Calc): 87 mg/dL (ref <130)
Total CHOL/HDL Ratio: 2.9 (calc) (ref <5.0)
Triglycerides: 200 mg/dL — ABNORMAL HIGH (ref <150)

## 2024-01-03 LAB — HEMOGLOBIN A1C
Hgb A1c MFr Bld: 6.1 % — ABNORMAL HIGH (ref <5.7)
Mean Plasma Glucose: 128 mg/dL
eAG (mmol/L): 7.1 mmol/L

## 2024-01-03 LAB — COMPREHENSIVE METABOLIC PANEL WITH GFR
AG Ratio: 1.7 (calc) (ref 1.0–2.5)
ALT: 17 U/L (ref 6–29)
AST: 30 U/L (ref 10–35)
Albumin: 4.5 g/dL (ref 3.6–5.1)
Alkaline phosphatase (APISO): 73 U/L (ref 37–153)
BUN/Creatinine Ratio: 18 (calc) (ref 6–22)
BUN: 23 mg/dL (ref 7–25)
CO2: 29 mmol/L (ref 20–32)
Calcium: 9.2 mg/dL (ref 8.6–10.4)
Chloride: 102 mmol/L (ref 98–110)
Creat: 1.26 mg/dL — ABNORMAL HIGH (ref 0.60–1.00)
Globulin: 2.6 g/dL (ref 1.9–3.7)
Glucose, Bld: 179 mg/dL — ABNORMAL HIGH (ref 65–99)
Potassium: 4 mmol/L (ref 3.5–5.3)
Sodium: 141 mmol/L (ref 135–146)
Total Bilirubin: 0.8 mg/dL (ref 0.2–1.2)
Total Protein: 7.1 g/dL (ref 6.1–8.1)
eGFR: 44 mL/min/1.73m2 — ABNORMAL LOW (ref >=60)

## 2024-01-03 LAB — MICROALBUMIN / CREATININE URINE RATIO
Creatinine, Urine: 29 mg/dL (ref 20–275)
Microalb Creat Ratio: 248 mg/g{creat} — ABNORMAL HIGH (ref <30)
Microalb, Ur: 7.2 mg/dL

## 2024-01-06 ENCOUNTER — Ambulatory Visit

## 2024-01-06 ENCOUNTER — Ambulatory Visit: Payer: Self-pay | Admitting: Adult Health

## 2024-01-06 ENCOUNTER — Ambulatory Visit (INDEPENDENT_AMBULATORY_CARE_PROVIDER_SITE_OTHER): Admitting: Professional Counselor

## 2024-01-06 ENCOUNTER — Encounter: Payer: Self-pay | Admitting: Professional Counselor

## 2024-01-06 DIAGNOSIS — F331 Major depressive disorder, recurrent, moderate: Secondary | ICD-10-CM | POA: Diagnosis not present

## 2024-01-06 DIAGNOSIS — F411 Generalized anxiety disorder: Secondary | ICD-10-CM | POA: Diagnosis not present

## 2024-01-06 NOTE — Progress Notes (Signed)
      [facility] Counselor/Therapist Progress Note  Patient ID: Abigail Oliver, MRN: [id number],    Date: 01/06/2024  Time Spent: 10:11 AM to 11:09 AM  Treatment Type: Individual Therapy  Reported Symptoms: Sleeplessness, worries, stress, interpersonal concerns, caregiver strain, low mood, health concerns, anxiousness, fatigue, irritability, trouble relaxing, restlessness, grief/loss  Mental Status Exam:  Appearance:   Neat     Behavior:  Appropriate, Sharing, and Motivated  Motor:  Normal  Speech/Language:   Clear and Coherent and Normal Rate  Affect:  Appropriate and Congruent  Mood:  normal  Thought process:  normal  Thought content:    WNL  Sensory/Perceptual disturbances:    WNL  Orientation:  oriented to person, place, time/date, and situation  Attention:  Good  Concentration:  Good  Memory:  WNL  Fund of knowledge:   Good  Insight:    Good  Judgment:   Good  Impulse Control:  Good   Risk Assessment: Danger to Self:  No Self-injurious Behavior: No Danger to Others: No Duty to Warn:no Physical Aggression / Violence:No  Access to Firearms a concern: No  Gang Involvement:No   Subjective: Patient presented to session to address concerns of anxiety and depression.  She reported mixed progress at this time.  She reported having lost her dog recently after just having lost her other dog, and for her husband's back she be injured and for him to be unable to walk.  She reported concerns around her daughters challenging circumstances to continue, and for herself to be continuing to implement boundaries as needed.  She reported her son's health to be concerning at this time, and for herself to still be out of work.  Patient identified continuing to only get 4 hours of sleep a night, and counselor helped patient facilitate contact with nurse on call at practice to assist patient in medication management.  Counselor actively listened, helped facilitate insight, reinforced patient  strengths, and helped patient to identify goals for resourcing increased support.  Interventions: Solution-Oriented/Positive Psychology, Humanistic/Existential, Insight-Oriented, and Resourcing  Diagnosis:   ICD-10-CM   1. Major depressive disorder, recurrent episode, moderate (HCC)  F33.1     2. Generalized anxiety disorder  F41.1       Plan: Patient is scheduled for follow-up; continue process work and developing coping skills.  STG between sessions for patient to follow-up with senior center and consider teaching a class or taking 1 to increase social, supportive and creative facets in life, continue to prioritize self and husband care including medical needs for both.  Abigail Oliver, [facility]

## 2024-01-06 NOTE — Progress Notes (Signed)
--     A1c 6.1, improved from 7.1 -   Triglycerides 200, down from 253 -LDL and cholesterol normal -   Kidney function stable ranging as chronic kidney disease stage IIIb -Urine microalbumin elevated, continue valsartan  -   Electrolytes and liver enzymes normal -   Continue current orders

## 2024-01-11 ENCOUNTER — Ambulatory Visit: Attending: Cardiovascular Disease

## 2024-01-11 DIAGNOSIS — Z7901 Long term (current) use of anticoagulants: Secondary | ICD-10-CM | POA: Diagnosis not present

## 2024-01-11 DIAGNOSIS — Z952 Presence of prosthetic heart valve: Secondary | ICD-10-CM | POA: Diagnosis not present

## 2024-01-11 LAB — POCT INR: INR: 3.7 — AB (ref 2.0–3.0)

## 2024-01-11 NOTE — Patient Instructions (Signed)
 Description   Only take 1/2 tablet today and then continue taking warfarin 1/2 tablet daily except for 1 tablet on Monday, Wednesday and Friday.    Repeat INR in 2 weeks.  Coumadin  Clinic [phone]

## 2024-01-11 NOTE — Progress Notes (Signed)
 INR 3.7 Please see anticoagulation encounter

## 2024-01-13 ENCOUNTER — Ambulatory Visit: Admitting: Adult Health

## 2024-01-13 DIAGNOSIS — Z1231 Encounter for screening mammogram for malignant neoplasm of breast: Secondary | ICD-10-CM | POA: Diagnosis not present

## 2024-01-25 ENCOUNTER — Ambulatory Visit: Attending: Cardiovascular Disease

## 2024-01-25 DIAGNOSIS — Z952 Presence of prosthetic heart valve: Secondary | ICD-10-CM | POA: Insufficient documentation

## 2024-01-25 DIAGNOSIS — Z7901 Long term (current) use of anticoagulants: Secondary | ICD-10-CM | POA: Insufficient documentation

## 2024-01-25 LAB — POCT INR: INR: 1.7 — AB (ref 2.0–3.0)

## 2024-01-25 NOTE — Progress Notes (Deleted)
 Description   Take 1.5 tablets today and take 1 tablet tomorrow, then continue taking warfarin 1/2 tablet daily except for 1 tablet on Monday, Wednesday and Friday.    Repeat INR in 2 weeks.  Coumadin  Clinic [phone]

## 2024-01-25 NOTE — Progress Notes (Signed)
 Description   INR 1.7, Take 1.5 tablets today and take 1 tablet tomorrow, then continue taking warfarin 1/2 tablet daily except for 1 tablet on Monday, Wednesday and Friday.    Repeat INR in 2 weeks.  Coumadin  Clinic [phone]

## 2024-01-25 NOTE — Patient Instructions (Addendum)
 Description   INR 1.7, Take 1.5 tablets today and take 1 tablet tomorrow, then continue taking warfarin 1/2 tablet daily except for 1 tablet on Monday, Wednesday and Friday.    Repeat INR in 2 weeks.  Coumadin  Clinic [phone]

## 2024-01-27 ENCOUNTER — Encounter: Payer: Self-pay | Admitting: Physician Assistant

## 2024-01-27 ENCOUNTER — Ambulatory Visit (INDEPENDENT_AMBULATORY_CARE_PROVIDER_SITE_OTHER): Admitting: Physician Assistant

## 2024-01-27 DIAGNOSIS — F4323 Adjustment disorder with mixed anxiety and depressed mood: Secondary | ICD-10-CM

## 2024-01-27 DIAGNOSIS — F99 Mental disorder, not otherwise specified: Secondary | ICD-10-CM

## 2024-01-27 DIAGNOSIS — F5105 Insomnia due to other mental disorder: Secondary | ICD-10-CM

## 2024-01-27 MED ORDER — SERTRALINE HCL 100 MG PO TABS: 150.0000 mg | ORAL_TABLET | Freq: Every day | ORAL | 1 refills | Status: DC

## 2024-01-27 MED ORDER — LORAZEPAM 0.5 MG PO TABS
ORAL_TABLET | ORAL | 0 refills | Status: AC
Start: 2024-01-27 — End: ?

## 2024-01-27 NOTE — Progress Notes (Signed)
 [facility]  Patient ID: Abigail Oliver,  MRN: [id number]  PCP: Medina-Vargas, Monina C, NP  Date of Evaluation: 01/27/2024 Time spent:25 minutes  Chief Complaint:  Chief Complaint   Depression; Anxiety; Insomnia; Follow-up    HISTORY/CURRENT STATUS: HPI 4 months overdue for f/u appt. No show 09/12/2023.  Continues to have anxiety.  The hydroxyzine  has not been very effective.  Insomnia is also still an issue.  Not as bad, trazodone  has helped some.  She has trouble falling asleep and staying asleep, not every night but frequently.  Zoloft  does help her mood some.  Recently lost her dog, that's been sad. She is able to enjoy things.  Energy and motivation are fair.  Appetite is normal, weight stable.  ADLs and personal hygiene are normal.  No mania, delirium, psychosis, SI/HI.  Individual Medical History/ Review of Systems: Changes? :Yes  Back pain  Past medications for mental health diagnoses include: Zoloft , Ativan -unsure of effect, Cymbalta , Mirtzapine took 1 time and it didn't help and may have affected her kidneys,  Trazodone  hasn't helped at lower doses but at higher doses caused extreme drowsiness the next day. Ambien  caused nightmares.   Allergies: Atorvastatin , Dapagliflozin, Gabapentin , Simvastatin , Sulfa  antibiotics, and Iodinated contrast media  Current Medications:  Current Outpatient Medications:    Ascorbic Acid  (VITAMIN C ) 1000 MG tablet, Take 1,000 mg by mouth every evening., Disp: , Rfl:    aspirin  EC 81 MG tablet, Take 81 mg by mouth every evening., Disp: , Rfl:    Blood Glucose Monitoring Suppl DEVI, 1 each by Does not apply route daily. May substitute to any manufacturer covered by patient's insurance., Disp: 1 each, Rfl: 0   cyanocobalamin  (VITAMIN B12) 1000 MCG tablet, Take 1,000 mcg by mouth daily., Disp: , Rfl:    fluticasone  (FLONASE ) 50 MCG/ACT nasal spray, Place 2 sprays into both nostrils as needed for allergies or rhinitis., Disp: , Rfl:     folic acid  (FOLVITE ) 800 MCG tablet, Take 800 mcg by mouth daily., Disp: , Rfl:    Glucose Blood (BLOOD GLUCOSE TEST STRIPS) STRP, 1 each by In Vitro route daily. May substitute to any manufacturer covered by patient's insurance., Disp: 100 strip, Rfl: 3   Lancet Device MISC, 1 each by Does not apply route daily. May substitute to any manufacturer covered by patient's insurance., Disp: 1 each, Rfl: 0   Lancets Misc. MISC, 1 each by Does not apply route daily. May substitute to any manufacturer covered by patient's insurance., Disp: 100 each, Rfl: 3   latanoprost  (XALATAN ) 0.005 % ophthalmic solution, PLACE 1 DROP INTO BOTH EYES AT BEDTIME., Disp: 2.5 mL, Rfl: 2   LORazepam  (ATIVAN ) 0.5 MG tablet, 1/2-1 po every day prn anxiety.  1-2 po at bedtime routinely for sleep., Disp: 45 tablet, Rfl: 0   metoprolol  succinate (TOPROL  XL) 50 MG 24 hr tablet, Take 1 tablet (50 mg total) by mouth daily. Take with or immediately following a meal., Disp: 90 tablet, Rfl: 3   Multiple Vitamin (MULTIVITAMIN WITH MINERALS) TABS tablet, Take 2 tablets by mouth daily., Disp: , Rfl:    nitroGLYCERIN  (NITROSTAT ) 0.4 MG SL tablet, Place 1 tablet (0.4 mg total) under the tongue every 5 (five) minutes as needed for chest pain., Disp: 20 tablet, Rfl: 12   pantoprazole  (PROTONIX ) 40 MG tablet, Take 1 tablet (40 mg total) by mouth daily., Disp: , Rfl:    potassium chloride  SA (KLOR-CON  M20) 20 MEQ tablet, Take 1 tablet (20 mEq total) by mouth  daily., Disp: , Rfl:    Psyllium (METAMUCIL) 0.36 g CAPS, Take 1 tablet by mouth daily as needed., Disp: , Rfl:    QUEtiapine  (SEROQUEL ) 25 MG tablet, Take 1 tablet (25 mg total) by mouth at bedtime. (Patient taking differently: Take 37.5 mg by mouth at bedtime.), Disp: 30 tablet, Rfl: 1   rosuvastatin  (CRESTOR ) 20 MG tablet, Take 1 tablet (20 mg total) by mouth every evening., Disp: 90 tablet, Rfl: 1   torsemide  (DEMADEX ) 20 MG tablet, Take 1 tablet (20 mg total) by mouth daily. increase her  Torsemide  to 20mg  twice daily for 3 days THEN BACK TO ONCE DAILY, Disp: , Rfl:    valsartan  (DIOVAN ) 40 MG tablet, TAKE 1 TABLET BY MOUTH DAILY, Disp: 90 tablet, Rfl: 2   warfarin (COUMADIN ) 3 MG tablet, TAKE 1/2 TO 1 TABLET BY MOUTH DAILY AS DIRECTED (Patient taking differently: Take 1.5-3 mg by mouth as directed. TAKE 1 TABLET (3 MG) ON Mon,Wed,Fri,Sun & TAKE 1/2 TABLET (1.5 MG) on Tues,Thurs,Sat), Disp: 90 tablet, Rfl: 0   sertraline  (ZOLOFT ) 100 MG tablet, Take 1.5 tablets (150 mg total) by mouth daily., Disp: 45 tablet, Rfl: 1 Medication Side Effects: none  Family Medical/ Social History: Changes? Yes see HPI  MENTAL HEALTH EXAM:  There were no vitals taken for this visit.There is no height or weight on file to calculate BMI.  General Appearance: Casual and Well Groomed  Eye Contact:  Good  Speech:  Clear and Coherent and Normal Rate  Volume:  Normal  Mood:  Euthymic  Affect:  Congruent  Thought Process:  Goal Directed and Descriptions of Associations: Circumstantial  Orientation:  Full (Time, Place, and Person)  Thought Content: Logical   Suicidal Thoughts:  No  Homicidal Thoughts:  No  Memory:  WNL  Judgement:  Good  Insight:  Good  Psychomotor Activity:  Walks slowly with a four-pronged cane  Concentration:  Concentration: Good  Recall:  Good  Fund of Knowledge: Good  Language: Good  Assets:  Desire for Improvement Financial Resources/Insurance Housing Transportation Vocational/Educational  ADL's:  Intact  Cognition: WNL  Prognosis:  Good   DIAGNOSES:    ICD-10-CM   1. Situational mixed anxiety and depressive disorder  F43.23     2. Insomnia due to other mental disorder  F51.05    F99       Receiving Psychotherapy: Yes with Cato Sprang, [facility]  RECOMMENDATIONS:   PDMP reviewed.  Ativan  filled 06/24/2023. I provided approximately  25 minutes of face to face time during this encounter, including time spent before and after the visit in records review, medical  decision making, counseling pertinent to today's visit, and charting.   We discussed the anxiety and the fact that hydroxyzine  has not been very helpful.  I recommend adding a benzodiazepine.  Specifically Ativan  because it does not seem to cause as much sedation as some of the others and the goal is not to cause sedation during the day.  We discussed the short-term and long-term risks of benzodiazepines including sedation, increased risk of falling, dizziness, tolerance, and addictive potential.   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  She knows not to drive with it.  She understands and accepts.  To help prevent the anxiety I recommend increasing the Zoloft .  She agrees.  Start Ativan  0.5 mg, 1/2-1 p.o. daily as needed anxiety and 1-2  p.o. nightly routinely for sleep. Increase Zoloft  100 mg, 1.5 pills daily to help with situational anxiety and depression. Continue therapy with Cato Sprang, [facility].   Return in 6 to 8 weeks.  Compliance with follow-up appointments discussed.  Verneita Cooks, PA-C

## 2024-02-02 DIAGNOSIS — N1832 Chronic kidney disease, stage 3b: Secondary | ICD-10-CM | POA: Diagnosis not present

## 2024-02-06 ENCOUNTER — Ambulatory Visit: Admitting: Professional Counselor

## 2024-02-08 ENCOUNTER — Ambulatory Visit (HOSPITAL_COMMUNITY)
Admission: RE | Admit: 2024-02-08 | Discharge: 2024-02-08 | Disposition: A | Discharge status: Home / Self Care | Source: Ambulatory Visit | Attending: Cardiovascular Disease | Admitting: Cardiovascular Disease

## 2024-02-08 ENCOUNTER — Ambulatory Visit (HOSPITAL_BASED_OUTPATIENT_CLINIC_OR_DEPARTMENT_OTHER)
Admission: RE | Admit: 2024-02-08 | Discharge: 2024-02-08 | Disposition: A | Discharge status: Home / Self Care | Source: Ambulatory Visit | Attending: Cardiovascular Disease | Admitting: Cardiovascular Disease

## 2024-02-08 DIAGNOSIS — I739 Peripheral vascular disease, unspecified: Secondary | ICD-10-CM | POA: Diagnosis not present

## 2024-02-08 DIAGNOSIS — N1832 Chronic kidney disease, stage 3b: Secondary | ICD-10-CM | POA: Diagnosis not present

## 2024-02-09 ENCOUNTER — Ambulatory Visit: Attending: Cardiovascular Disease

## 2024-02-09 ENCOUNTER — Ambulatory Visit: Payer: Self-pay | Admitting: Cardiovascular Disease

## 2024-02-09 ENCOUNTER — Other Ambulatory Visit: Payer: Self-pay | Admitting: Physician Assistant

## 2024-02-09 DIAGNOSIS — Z952 Presence of prosthetic heart valve: Secondary | ICD-10-CM | POA: Insufficient documentation

## 2024-02-09 DIAGNOSIS — Z7901 Long term (current) use of anticoagulants: Secondary | ICD-10-CM | POA: Diagnosis not present

## 2024-02-09 LAB — POCT INR: INR: 2.3 (ref 2.0–3.0)

## 2024-02-09 LAB — VAS US ABI WITH/WO TBI
Left ABI: 0.99
Right ABI: 0.78

## 2024-02-09 NOTE — Patient Instructions (Signed)
 Take another 1 tablet today only  then continue taking warfarin 1/2 tablet daily except for 1 tablet on Monday, Wednesday and Friday.    Repeat INR in 3 weeks.  [facility]  Clinic [phone]

## 2024-02-09 NOTE — Progress Notes (Signed)
 INR  2.3  Please see anticoagulation encounter Take another 1 tablet today only  then continue taking warfarin 1/2 tablet daily except for 1 tablet on Monday, Wednesday and Friday.    Repeat INR in 3 weeks.  Cou[facility]  Clinic [phone]

## 2024-02-10 DIAGNOSIS — N1832 Chronic kidney disease, stage 3b: Secondary | ICD-10-CM | POA: Diagnosis not present

## 2024-02-10 DIAGNOSIS — E785 Hyperlipidemia, unspecified: Secondary | ICD-10-CM | POA: Diagnosis not present

## 2024-02-10 DIAGNOSIS — I5030 Unspecified diastolic (congestive) heart failure: Secondary | ICD-10-CM | POA: Diagnosis not present

## 2024-02-10 DIAGNOSIS — E1122 Type 2 diabetes mellitus with diabetic chronic kidney disease: Secondary | ICD-10-CM | POA: Diagnosis not present

## 2024-02-10 DIAGNOSIS — I129 Hypertensive chronic kidney disease with stage 1 through stage 4 chronic kidney disease, or unspecified chronic kidney disease: Secondary | ICD-10-CM | POA: Diagnosis not present

## 2024-02-11 NOTE — Telephone Encounter (Signed)
 Start Seroquel  25 mg, 1 p.o. nightly as needed. If she is not sleeping well after 1 week it is okay to increase to 1.5 pills. If she needs to go higher than that she should call.

## 2024-02-13 ENCOUNTER — Encounter: Payer: Self-pay | Admitting: Professional Counselor

## 2024-02-13 ENCOUNTER — Ambulatory Visit: Admitting: Professional Counselor

## 2024-02-13 DIAGNOSIS — F411 Generalized anxiety disorder: Secondary | ICD-10-CM

## 2024-02-13 DIAGNOSIS — F330 Major depressive disorder, recurrent, mild: Secondary | ICD-10-CM | POA: Diagnosis not present

## 2024-02-13 NOTE — Progress Notes (Signed)
      [facility] Counselor/Therapist Progress Note  Patient ID: Abigail Oliver, MRN: [id number],    Date: 02/13/2024  Time Spent: 2:12 PM to 3:10 PM  Treatment Type: Individual Therapy  Reported Symptoms: Worries, anxiousness, sadness, low mood, sleeplessness, health concerns, phase of life concerns, interpersonal concerns, stress,  Mental Status Exam:  Appearance:   Casual     Behavior:  Appropriate, Sharing, and Motivated  Motor:  Normal  Speech/Language:   Clear and Coherent and Normal Rate  Affect:  Appropriate and Congruent  Mood:  normal  Thought process:  normal  Thought content:    WNL  Sensory/Perceptual disturbances:    WNL  Orientation:  oriented to person, place, time/date, and situation  Attention:  Good  Concentration:  Good  Memory:  WNL  Fund of knowledge:   Good  Insight:    Good  Judgment:   Good  Impulse Control:  Good   Risk Assessment: Danger to Self:  No Self-injurious Behavior: No Danger to Others: No Duty to Warn:no Physical Aggression / Violence:No  Access to Firearms a concern: No  Gang Involvement:No   Subjective: Patient presented to session to address concerns of depression and anxiety.  She reported mixed progress at this time.  Patient processed experience of resolution to family crisis, and reactions and implications as relate her own wellbeing and family as a whole, and particularly her daughter.  She processed the experience of having been holding her breath for 2 years as relates the circumstance, and processed how she was coping, and counselor helped facilitate insight into growth and healing.  Patient identified strong sense of boundaries at this time, including sense that it is a new beginning for both her daughter and for the family, and she identified her intention to continue to self protect holistically as needed.  Counselor affirmed patient strengths and courage, and resiliency in the face of complexity of concerns.  Patient  processed the experience of her health concerns including high blood pressure, possible stenosis, and 3 medical appointments recently.  She reported continued challenges with psychotropic medication, and how while her sleep is better with medication, she may be experiencing too much lethargy in waking hours as a result.  Counselor actively listened, affirmed patient feelings and experience, and encouraged continued follow-up with medical team and prescribing provider.  Interventions: Assertiveness/Communication, Solution-Oriented/Positive Psychology, Humanistic/Existential, and Insight-Oriented  Diagnosis:   ICD-10-CM   1. Major depressive disorder, recurrent episode, mild  F33.0     2. Generalized anxiety disorder  F41.1       Plan: Patient is scheduled for follow-up; continue process work and developing coping skills.  STG between sessions for patient to practice concise boundary responses to intrusive family member, continue to prioritize her health and caregiving of husband.  Abigail Oliver, [facility]

## 2024-02-14 NOTE — Telephone Encounter (Signed)
 Pt reports it didn't work and she is no longer taking.

## 2024-02-21 ENCOUNTER — Ambulatory Visit: Attending: Cardiovascular Disease | Admitting: Cardiovascular Disease

## 2024-02-21 ENCOUNTER — Encounter: Payer: Self-pay | Admitting: Cardiovascular Disease

## 2024-02-21 VITALS — BP 130/48 | HR 67 | Ht 62.0 in | Wt 177.0 lb

## 2024-02-21 DIAGNOSIS — I6523 Occlusion and stenosis of bilateral carotid arteries: Secondary | ICD-10-CM | POA: Diagnosis not present

## 2024-02-21 DIAGNOSIS — I739 Peripheral vascular disease, unspecified: Secondary | ICD-10-CM | POA: Diagnosis not present

## 2024-02-21 NOTE — Progress Notes (Signed)
 02/21/2024 Abigail Oliver   Feb 19, 1946  [id number]  Primary Physician Medina-Vargas, Abigail BROCKS, NP Primary Cardiologist: Abigail Oliver GENI CODY Oliver, [place]  HPI:  Abigail Oliver is a 78 y.o.  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  She has also seen Abigail Oliver for diastolic heart failure.  She was referred to me through the courtesy of Dr. Charlena Oliver for peripheral vascular evaluation because of lifestyle limiting claudication in her right leg.  She currently sees Dr. CLEMENTEEN Oliver. I last saw her in the office 09/21/2023.    Her other problems include hypertension, hyperlipidemia and diabetes. She has had coronary bypass grafting in 2000 as well as aortic valve replacement with a [employer] Jude aortic valve. Her Myoview  performed within the last several weeks with nonischemic.  Lower extremity arterial Doppler studies performed prior to her intervention revealed a right ABI of 0.81 with a high-frequency signal in the mid right SFA the left ABI 1.1.    I performed PTA and nitinol self-expanding stenting of an 80% mid right SFA stenosis on 09/20/2011.  She did well after that although I did not see her back in the office.  She has been followed by Abigail Oliver for diastolic heart failure.  She has complained of some back hip and knee pain as well as symptoms compatible with diabetic peripheral neuropathy although in talking to her today it does not sound as though she has claudication symptoms.  Doppler studies performed 01/23/2019 revealed normal ABIs bilaterally with a moderately elevated signal in the proximal right popliteal artery.  I suggested that we continue to follow that noninvasively and clinically.  She did break her hip and had ACS during her hospitalization.  She underwent cardiac catheterization by AbigailThukkani 09/07/21 revealing an occluded RCA with a patent RIMA to the distal RCA,  patent proximal LAD stent with no other significant CAD.  She complains of pain in her arms and legs which sounds arthritic.  She denies chest pain or shortness of breath.  Her last Doppler study performed 02/03/2022 revealed a right ABI 1.09, left of 0.92 with a moderately elevated velocity in the mid right SFA which had not changed since her prior study a year ago.   Since I saw her in the office 5 months ago she has remained stable.  She does complain of shortness of breath.  She complains of right hip pain and some numbness in her feet consistent with diabetic peripheral neuropathy.  Her ABI has however declined from 1.01-0.78 on the right with a high-frequency signal in her proximal right SFA performed 02/08/2024 although I cannot elicit a good history for claudication.   Current Meds  Medication Sig   Ascorbic Acid  (VITAMIN C ) 1000 MG tablet Take 1,000 mg by mouth every evening.   aspirin  EC 81 MG tablet Take 81 mg by mouth every evening.   Blood Glucose Monitoring Suppl DEVI 1 each by Does not apply route daily. May substitute to any manufacturer covered by patient's insurance.   cyanocobalamin  (VITAMIN B12) 1000 MCG tablet Take 1,000 mcg by mouth daily.   fluticasone  (FLONASE ) 50 MCG/ACT nasal spray Place 2 sprays into both nostrils as needed for allergies or rhinitis.   folic acid  (FOLVITE ) 800 MCG tablet Take 800 mcg by mouth daily.   Glucose Blood (BLOOD GLUCOSE TEST STRIPS) STRP 1 each by In Vitro route daily. May substitute to  any manufacturer covered by [employer].   Lancet Device MISC 1 each by Does not apply route daily. May substitute to any manufacturer covered by patient's insurance.   Lancets Misc. MISC 1 each by Does not apply route daily. May substitute to any manufacturer covered by patient's insurance.   latanoprost  (XALATAN ) 0.005 % ophthalmic solution PLACE 1 DROP INTO BOTH EYES AT BEDTIME.   LORazepam  (ATIVAN ) 0.5 MG tablet 1/2-1 po every day prn anxiety.  1-2 po at  bedtime routinely for sleep.   metoprolol  succinate (TOPROL  XL) 50 MG 24 hr tablet Take 1 tablet (50 mg total) by mouth daily. Take with or immediately following a meal.   Multiple Vitamin (MULTIVITAMIN WITH MINERALS) TABS tablet Take 2 tablets by mouth daily.   nitroGLYCERIN  (NITROSTAT ) 0.4 MG SL tablet Place 1 tablet (0.4 mg total) under the tongue every 5 (five) minutes as needed for chest pain.   pantoprazole  (PROTONIX ) 40 MG tablet Take 1 tablet (40 mg total) by mouth daily.   potassium chloride  SA (KLOR-CON  M20) 20 MEQ tablet Take 1 tablet (20 mEq total) by mouth daily.   Psyllium (METAMUCIL) 0.36 g CAPS Take 1 tablet by mouth daily as needed.   rosuvastatin  (CRESTOR ) 20 MG tablet Take 1 tablet (20 mg total) by mouth every evening.   sertraline  (ZOLOFT ) 100 MG tablet Take 1.5 tablets (150 mg total) by mouth daily.   torsemide  (DEMADEX ) 20 MG tablet Take 1 tablet (20 mg total) by mouth daily. increase her Torsemide  to 20mg  twice daily for 3 days THEN BACK TO ONCE DAILY   valsartan  (DIOVAN ) 40 MG tablet TAKE 1 TABLET BY MOUTH DAILY   warfarin (COUMADIN ) 3 MG tablet TAKE 1/2 TO 1 TABLET BY MOUTH DAILY AS DIRECTED (Patient taking differently: Take 1.5-3 mg by mouth as directed. TAKE 1 TABLET (3 MG) ON Mon,Wed,Fri,Sun & TAKE 1/2 TABLET (1.5 MG) on Tues,Thurs,Sat)     Allergies  Allergen Reactions   Atorvastatin      Muscle pain   Dapagliflozin     Other reaction(s): nausea   Gabapentin      Other reaction(s): stomach issues   Simvastatin  Other (See Comments)    Muscle pain   Sulfa  Antibiotics     unknown   Iodinated Contrast Media Rash     Red rash after cardiac cath 1 wk ago, ? Contrast allergy, requires 13 hr prep now per [email or web address]      Social History   Socioeconomic History   Marital status: Married    Spouse name: Fairy   Number of children: 2   Years of education: 14   Highest education level: Not on file  Occupational History    Comment: hair dresser   Tobacco Use   Smoking status: Never   Smokeless tobacco: Never  Vaping Use   Vaping status: Never Used  Substance and Sexual Activity   Alcohol  use: Not Currently   Drug use: Not Currently   Sexual activity: Never  Other Topics Concern   Not on file  Social History Narrative   Pt is a high school graduate with 2 years of college. Married in 1967 she has 1 son born 68 and 1 daughter born 8 and 1 grandchild. 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      Caffeine use- 2-3 /day, soda, tea       Lives with husband one story home   Social Drivers of Health   Financial Resource Strain: Low Risk  (  07/22/2023)   Overall Financial Resource Strain (CARDIA)    Difficulty of Paying Living Expenses: Not hard at all  Food Insecurity: No Food Insecurity (07/22/2023)   Hunger Vital Sign    Worried About Running Out of Food in the Last Year: Never true    Ran Out of Food in the Last Year: Never true  Transportation Needs: No Transportation Needs (07/22/2023)   PRAPARE - Administrator, Civil Service (Medical): No    Lack of Transportation (Non-Medical): No  Physical Activity: Inactive (07/22/2023)   Exercise Vital Sign    Days of Exercise per Week: 0 days    Minutes of Exercise per Session: 0 min  Stress: Stress Concern Present (07/22/2023)   [employer] of Occupational Health - Occupational Stress Questionnaire    Feeling of Stress : Very much  Social Connections: Socially Isolated (07/22/2023)   Social Connection and Isolation Panel    Frequency of Communication with Friends and Family: Once a week    Frequency of Social Gatherings with Friends and Family: Once a week    Attends Religious Services: Never    Database administrator or Organizations: No    Attends Banker Meetings: Never    Marital Status: Married  Catering manager Violence: Not At Risk (07/22/2023)   Humiliation, Afraid, Rape, and Kick questionnaire    Fear of Current  or Ex-Partner: No    Emotionally Abused: No    Physically Abused: No    Sexually Abused: No     Review of Systems: General: negative for chills, fever, night sweats or weight changes.  Cardiovascular: negative for chest pain, dyspnea on exertion, edema, orthopnea, palpitations, paroxysmal nocturnal dyspnea or shortness of breath Dermatological: negative for rash Respiratory: negative for cough or wheezing Urologic: negative for hematuria Abdominal: negative for nausea, vomiting, diarrhea, bright red blood per rectum, melena, or hematemesis Neurologic: negative for visual changes, syncope, or dizziness All other systems reviewed and are otherwise negative except as noted above.    Blood pressure (!) 130/48, pulse 67, height 5' 2 (1.575 m), weight 177 lb (80.3 kg), SpO2 96%.  General appearance: alert and no distress Neck: no adenopathy, no carotid bruit, no JVD, supple, symmetrical, trachea midline, and thyroid  not enlarged, symmetric, no tenderness/mass/nodules Lungs: clear to auscultation bilaterally Heart: Soft outflow tract murmur with crisp mechanical prosthetic valve sounds. Extremities: extremities normal, atraumatic, no cyanosis or edema Pulses: 2+ and symmetric Skin: Skin color, texture, turgor normal. No rashes or lesions Neurologic: Grossly normal  EKG EKG Interpretation Date/Time:  Tuesday February 21 2024 13:36:16 EDT Ventricular Rate:  65 PR Interval:  166 QRS Duration:  78 QT Interval:  432 QTC Calculation: 449 R Axis:   10  Text Interpretation: Normal sinus rhythm ST & T wave abnormality, consider lateral ischemia When compared with ECG of 30-Mar-2023 17:49, PREVIOUS ECG IS PRESENT Confirmed by Court Carrier [phone]) on 02/21/2024 1:38:40 PM    ASSESSMENT AND PLAN:   Peripheral arterial disease History of peripheral arterial disease status post right SFA intervention by myself/ 09/20/11 with a nitinol self-expanding stent.  She had no disease in her left  system.  She does complain of right hip pain which sounds arthritic as well as some numbness in her feet which sound like diabetic peripheral neuropathy.  Her Doppler studies however performed 02/08/2024 did show a decline in her right ABI from 1.01-0.78 with a high-frequency signal in her proximal right SFA.  I cannot however elicit a good history  of claudication and therefore do not feel inclined to pursue an invasive approach.  Will continue to follow her noninvasively.     Abigail DOROTHA Lesches Oliver FACP,FACC,FAHA, [facility] 02/21/2024 1:54 PM

## 2024-02-21 NOTE — Assessment & Plan Note (Signed)
 History of peripheral arterial disease status post right SFA intervention by myself/ 09/20/11 with a nitinol self-expanding stent.  She had no disease in her left system.  She does complain of right hip pain which sounds arthritic as well as some numbness in her feet which sound like diabetic peripheral neuropathy.  Her Doppler studies however performed 02/08/2024 did show a decline in her right ABI from 1.01-0.78 with a high-frequency signal in her proximal right SFA.  I cannot however elicit a good history of claudication and therefore do not feel inclined to pursue an invasive approach.  Will continue to follow her noninvasively.

## 2024-02-21 NOTE — Patient Instructions (Signed)
 Medication Instructions:  Your physician recommends that you continue on your current medications as directed. Please refer to the Current Medication list given to you today.  *If you need a refill on your cardiac medications before your next appointment, please call your pharmacy*  Testing/Procedures: Your physician has requested that you have a carotid duplex. This test is an ultrasound of the carotid arteries in your neck. It looks at blood flow through these arteries that supply the brain with blood. Allow one hour for this exam. There are no restrictions or special instructions. This will take place at [address] 4th floor **To do in February**   Please note: We ask at that you not bring children with you during ultrasound (echo/ vascular) testing. Due to room size and safety concerns, children are not allowed in the ultrasound rooms during exams. Our front office staff cannot provide observation of children in our lobby area while testing is being conducted. An adult accompanying a patient to their appointment will only be allowed in the ultrasound room at the discretion of the ultrasound technician under special circumstances. We apologize for any inconvenience.   Your physician has requested that you have a lower extremity arterial duplex. During this test, ultrasound is used to evaluate arterial blood flow in the legs. Allow one hour for this exam. There are no restrictions or special instructions. This will take place at [address] 4th floor  **To do in September 2026**  Please note: We ask at that you not bring children with you during ultrasound (echo/ vascular) testing. Due to room size and safety concerns, children are not allowed in the ultrasound rooms during exams. Our front office staff cannot provide observation of children in our lobby area while testing is being conducted. An adult accompanying a patient to their appointment will only be allowed in the ultrasound room  at the discretion of the ultrasound technician under special circumstances. We apologize for any inconvenience.  Your physician has requested that you have an ankle brachial index (ABI). During this test an ultrasound and blood pressure cuff are used to evaluate the arteries that supply the arms and legs with blood. Allow thirty minutes for this exam. There are no restrictions or special instructions. This will take place at [address], 4th floor   **To do in September 2026**   Please note: We ask at that you not bring children with you during ultrasound (echo/ vascular) testing. Due to room size and safety concerns, children are not allowed in the ultrasound rooms during exams. Our front office staff cannot provide observation of children in our lobby area while testing is being conducted. An adult accompanying a patient to their appointment will only be allowed in the ultrasound room at the discretion of the ultrasound technician under special circumstances. We apologize for any inconvenience.   Follow-Up: At [facility], you and your health needs are our priority.  As part of our continuing mission to provide you with exceptional heart care, our providers are all part of one team.  This team includes your primary Cardiologist (physician) and Advanced Practice Providers or APPs (Physician Assistants and Nurse Practitioners) who all work together to provide you with the care you need, when you need it.  Your next appointment:   12 month(s)  Provider:   Dorn Lesches, MD ([facility] only)

## 2024-02-24 DIAGNOSIS — E119 Type 2 diabetes mellitus without complications: Secondary | ICD-10-CM | POA: Diagnosis not present

## 2024-02-24 DIAGNOSIS — H532 Diplopia: Secondary | ICD-10-CM | POA: Diagnosis not present

## 2024-02-24 DIAGNOSIS — H401134 Primary open-angle glaucoma, bilateral, indeterminate stage: Secondary | ICD-10-CM | POA: Diagnosis not present

## 2024-02-24 DIAGNOSIS — H04123 Dry eye syndrome of bilateral lacrimal glands: Secondary | ICD-10-CM | POA: Diagnosis not present

## 2024-02-27 ENCOUNTER — Ambulatory Visit: Admitting: Professional Counselor

## 2024-02-27 ENCOUNTER — Encounter: Payer: Self-pay | Admitting: Professional Counselor

## 2024-02-27 DIAGNOSIS — F411 Generalized anxiety disorder: Secondary | ICD-10-CM

## 2024-02-27 DIAGNOSIS — F331 Major depressive disorder, recurrent, moderate: Secondary | ICD-10-CM | POA: Diagnosis not present

## 2024-02-27 NOTE — Progress Notes (Signed)
      [facility] Counselor/Therapist Progress Note  Patient ID: Abigail Oliver, MRN: [id number],    Date: 02/27/2024  Time Spent: 2:09 PM to 3:12 PM  Treatment Type: Individual Therapy  Reported Symptoms: Sadness, sense of guilt, worries, restlessness, anxiousness, low mood, health concerns, sleeplessness, anhedonia, fatigue, phase of life concerns, family concerns, interpersonal concerns  Mental Status Exam:  Appearance:   Casual     Behavior:  Appropriate, Sharing, and Motivated  Motor:  Normal  Speech/Language:   Clear and Coherent and Normal Rate  Affect:  Appropriate and Congruent  Mood:  normal  Thought process:  normal  Thought content:    WNL  Sensory/Perceptual disturbances:    WNL  Orientation:  oriented to person, place, time/date, and situation  Attention:  Good  Concentration:  Good  Memory:  WNL  Fund of knowledge:   Good  Insight:    Good  Judgment:   Good  Impulse Control:  Good   Risk Assessment: Danger to Self:  No Self-injurious Behavior: No Danger to Others: No Duty to Warn:no Physical Aggression / Violence:No  Access to Firearms a concern: No  Gang Involvement:No   Subjective: Patient presented to session to address concerns of anxiety and depression.  She reported minimal progress at this time.  She processed experience of heightened stress around her daughters complicated situation, and impact on patient wellbeing.  Patient discerned around possibility of a family intervention to try to help guide her decisions and lifestyle choices.  Patient identified continued over functioning tendencies not helped by daughters continued asks.  Counselor actively listened, affirmed patient feelings and experience, and assisted in facilitating insight into patient over functioning tendencies and safeguards around holistic impact.  Patient processed experience of maternal instincts and difficulty in process of making necessary choices and healing emotionally.   Counselor and patient discussed stages of grief, including ambiguous loss experience, whereby 1 may experience bargaining and denial.  Counselor and patient discussed patient self protective factors and her commitment to helping herself and her husband at this time.  Interventions: Assertiveness/Communication, Solution-Oriented/Positive Psychology, Humanistic/Existential, and Insight-Oriented  Diagnosis:   ICD-10-CM   1. Major depressive disorder, recurrent episode, moderate (HCC)  F33.1     2. Generalized anxiety disorder  F41.1       Plan: Patient is scheduled for follow-up; continue process work and developing coping skills.  Patient short-term goal between sessions to continue to limit over functioning for others, take care of self and husband's basic needs and continue to observe necessary boundaries for holistic protection.  Abigail Oliver, [facility]

## 2024-03-01 ENCOUNTER — Ambulatory Visit: Attending: Cardiovascular Disease | Admitting: *Deleted

## 2024-03-01 DIAGNOSIS — Z952 Presence of prosthetic heart valve: Secondary | ICD-10-CM | POA: Diagnosis not present

## 2024-03-01 DIAGNOSIS — Z7901 Long term (current) use of anticoagulants: Secondary | ICD-10-CM | POA: Diagnosis not present

## 2024-03-01 LAB — POCT INR: POC INR: 1.9

## 2024-03-01 NOTE — Progress Notes (Signed)
 Lab Results  Component Value Date   INR 1.9 03/01/2024   INR 2.3 02/09/2024   INR 1.7 (A) 01/25/2024    Description   INR 1.9, Take 1 tablet of warfarin today and then START taking warfarin 1 tablet daily except for 1/2 a tablet on Sunday, Tuesday and Thursdays.  Repeat INR in 2 weeks.  Coumadin  Clinic [phone]

## 2024-03-01 NOTE — Patient Instructions (Signed)
 Description   Take 1 tablet of warfarin today and then START taking warfarin 1 tablet daily except for 1/2 a tablet on Sunday, Tuesday and Thursdays.  Repeat INR in 2 weeks.  Coumadin  Clinic [phone]

## 2024-03-09 ENCOUNTER — Encounter: Payer: Self-pay | Admitting: Physician Assistant

## 2024-03-09 ENCOUNTER — Ambulatory Visit: Admitting: Physician Assistant

## 2024-03-09 DIAGNOSIS — F5105 Insomnia due to other mental disorder: Secondary | ICD-10-CM

## 2024-03-09 DIAGNOSIS — F4323 Adjustment disorder with mixed anxiety and depressed mood: Secondary | ICD-10-CM

## 2024-03-09 DIAGNOSIS — F99 Mental disorder, not otherwise specified: Secondary | ICD-10-CM

## 2024-03-09 MED ORDER — SERTRALINE HCL 100 MG PO TABS: 150.0000 mg | ORAL_TABLET | Freq: Every day | ORAL | 1 refills | Status: DC

## 2024-03-09 MED ORDER — AMITRIPTYLINE HCL 25 MG PO TABS: 25.0000 mg | ORAL_TABLET | Freq: Every day | ORAL | 1 refills | Status: DC

## 2024-03-09 NOTE — Progress Notes (Unsigned)
 [facility] Med Check  Patient ID: Abigail Oliver,  MRN: [id number]  PCP: Medina-Vargas, Monina C, NP  Date of Evaluation: 03/09/2024 Time spent:30 minutes  Chief Complaint:  Chief Complaint   Anxiety; Follow-up    HISTORY/CURRENT STATUS: HPI  For routine med check.  Ativan  was started last month for sleep only, but it didn't help a bit.  Might get 2.5 hours of sleep, wakes up often for no reason.  Not to go to the bathroom.  No naps during the day. Insomnia has been an issue for several years. Trouble falling asleep and staying asleep.  She has had several sleep studies over the years.  She did not sleep at all at the sleep center so she was told to go home around 3:00 in the morning.  She is not sure if she snores or not.  No witnessed apneas.  The sleep deprivation makes her extremely tired.  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  Energy and motivation are dependent on how much sleep she gets.  No extreme sadness, tearfulness, or feelings of hopelessness.  ADLs and personal hygiene are normal.  No change in memory.  Appetite has not changed.  No panic attacks.  No mania, delirium, AH/VH.  No SI/HI.  Individual Medical History/ Review of Systems: Changes? :No     Past medications for mental health diagnoses include: Zoloft , Ativan -unsure of effect, Cymbalta ,  Mirtzapine took 1 time and it didn't help and may have affected her kidneys,  Trazodone  hasn't helped at lower doses but at higher doses caused extreme drowsiness the next day. Ambien  caused nightmares. Melatonin helps her mind shut down but doesn't help sleep. Hydroxyzine  didn't help  Had 3 sleep studies over the years.   Allergies: Atorvastatin , Dapagliflozin, Gabapentin , Simvastatin , Sulfa  antibiotics, and Iodinated contrast media  Current Medications:  Current Outpatient Medications:    amitriptyline (ELAVIL) 25 MG tablet, Take 1 tablet (25 mg  total) by mouth at bedtime., Disp: 30 tablet, Rfl: 1   Ascorbic Acid  (VITAMIN C ) 1000 MG tablet, Take 1,000 mg by mouth every evening., Disp: , Rfl:    aspirin  EC 81 MG tablet, Take 81 mg by mouth every evening., Disp: , Rfl:    Blood Glucose Monitoring Suppl DEVI, 1 each by Does not apply route daily. May substitute to any manufacturer covered by patient's insurance., Disp: 1 each, Rfl: 0   cyanocobalamin  (VITAMIN B12) 1000 MCG tablet, Take 1,000 mcg by mouth daily., Disp: , Rfl:    fluticasone  (FLONASE ) 50 MCG/ACT nasal spray, Place 2 sprays into both nostrils as needed for allergies or rhinitis., Disp: , Rfl:    folic acid  (FOLVITE ) 800 MCG tablet, Take 800 mcg by mouth daily., Disp: , Rfl:    Glucose Blood (BLOOD GLUCOSE TEST STRIPS) STRP, 1 each by In Vitro route daily. May substitute to any manufacturer covered by patient's insurance., Disp: 100 strip, Rfl: 3   Lancet Device MISC, 1 each by Does not apply route daily. May substitute to any manufacturer covered by patient's insurance., Disp: 1 each, Rfl: 0   Lancets Misc. MISC, 1 each by Does not apply route daily. May substitute to any manufacturer covered by patient's insurance., Disp: 100 each, Rfl: 3   latanoprost  (XALATAN ) 0.005 % ophthalmic solution, PLACE 1 DROP INTO BOTH EYES AT BEDTIME., Disp: 2.5 mL, Rfl: 2   metoprolol  succinate (TOPROL  XL) 50 MG 24 hr  tablet, Take 1 tablet (50 mg total) by mouth daily. Take with or immediately following a meal., Disp: 90 tablet, Rfl: 3   Multiple Vitamin (MULTIVITAMIN WITH MINERALS) TABS tablet, Take 2 tablets by mouth daily., Disp: , Rfl:    nitroGLYCERIN  (NITROSTAT ) 0.4 MG SL tablet, Place 1 tablet (0.4 mg total) under the tongue every 5 (five) minutes as needed for chest pain., Disp: 20 tablet, Rfl: 12   pantoprazole  (PROTONIX ) 40 MG tablet, Take 1 tablet (40 mg total) by mouth daily., Disp: , Rfl:    potassium chloride  SA (KLOR-CON  M20) 20 MEQ tablet, Take 1 tablet (20 mEq total) by mouth daily.,  Disp: , Rfl:    Psyllium (METAMUCIL) 0.36 g CAPS, Take 1 tablet by mouth daily as needed., Disp: , Rfl:    rosuvastatin  (CRESTOR ) 20 MG tablet, Take 1 tablet (20 mg total) by mouth every evening., Disp: 90 tablet, Rfl: 1   torsemide  (DEMADEX ) 20 MG tablet, Take 1 tablet (20 mg total) by mouth daily. increase her Torsemide  to 20mg  twice daily for 3 days THEN BACK TO ONCE DAILY, Disp: , Rfl:    valsartan  (DIOVAN ) 40 MG tablet, TAKE 1 TABLET BY MOUTH DAILY, Disp: 90 tablet, Rfl: 2   warfarin (COUMADIN ) 3 MG tablet, TAKE 1/2 TO 1 TABLET BY MOUTH DAILY AS DIRECTED, Disp: 90 tablet, Rfl: 0   QUEtiapine  (SEROQUEL ) 25 MG tablet, Take 1 tablet (25 mg total) by mouth at bedtime. (Patient not taking: Reported on 02/21/2024), Disp: 30 tablet, Rfl: 1   sertraline  (ZOLOFT ) 100 MG tablet, Take 1.5 tablets (150 mg total) by mouth daily., Disp: 135 tablet, Rfl: 1 Medication Side Effects: none  Family Medical/ Social History: Changes?  There is less stress on her since her daughter went to court.  She has been sentenced to 90 hours of community service and 5 years of probation.  MENTAL HEALTH EXAM:  There were no vitals taken for this visit.There is no height or weight on file to calculate BMI.  General Appearance: Casual and Well Groomed  Eye Contact:  Good  Speech:  Clear and Coherent and Normal Rate  Volume:  Normal  Mood:  Euthymic  Affect:  Congruent  Thought Process:  Goal Directed and Descriptions of Associations: Circumstantial  Orientation:  Full (Time, Place, and Person)  Thought Content: Logical   Suicidal Thoughts:  No  Homicidal Thoughts:  No  Memory:  WNL  Judgement:  Good  Insight:  Good  Psychomotor Activity:  Walks slowly with a four-pronged cane  Concentration:  Concentration: Good  Recall:  Good  Fund of Knowledge: Good  Language: Good  Assets:  Communication Skills Desire for Improvement Financial Resources/Insurance Housing Transportation Vocational/Educational  ADL's:   Intact  Cognition: WNL  Prognosis:  Good   Neck circumference  14.5 inches  Epworth Sleepiness Scale score was only 2 with a slight chance of nodding off while watching TV and a slight chance of nodding off after lying down to rest.  Patient states she does not do any of the other activities like sit and read, be a passenger in a car for over an hour, sit and talk to someone, sit quietly after a meal without alcohol , or even driving any distance.  So these results are bit skewed and do not reflect her sleep time or quality.  DIAGNOSES:    ICD-10-CM   1. Situational mixed anxiety and depressive disorder  F43.23     2. Insomnia due to other mental disorder  F51.05  F99       Receiving Psychotherapy: Yes with Cato Sprang, [facility]  RECOMMENDATIONS:   PDMP reviewed.  Ativan  filled 01/27/2024. I provided approximately 30 minutes of face to face time during this encounter, including time spent before and after the visit in records review, medical decision making, counseling pertinent to today's visit, and charting.   We discussed options for insomnia.  She has never taken amitriptyline that she is aware of.  I recommend starting that.  Benefits, risk and side effects were discussed and she accepts.  I also recommend an in-home sleep study through [facility]. I explained that test and that sometimes it is more accurate than in a formal setting.  Another option would be to send her to Dr. Dedra Gores, a sleep medicine specialist.  We agreed to get the in-home study first and then if it is unclear of diagnosis or treatment options then I will refer her.  Sleep hygiene was again discussed.  Start amitriptyline 25 mg, 1 p.o. nightly. Continue  Zoloft  100 mg, 1.5 pills daily  Continue therapy with Cato Sprang, [facility].   Return in 6 to 8 weeks.    Verneita Cooks, PA-C

## 2024-03-12 ENCOUNTER — Other Ambulatory Visit: Payer: Self-pay

## 2024-03-13 ENCOUNTER — Telehealth: Payer: Self-pay | Admitting: Cardiovascular Disease

## 2024-03-13 ENCOUNTER — Ambulatory Visit: Admitting: Professional Counselor

## 2024-03-13 DIAGNOSIS — E1140 Type 2 diabetes mellitus with diabetic neuropathy, unspecified: Secondary | ICD-10-CM | POA: Diagnosis not present

## 2024-03-13 DIAGNOSIS — N1832 Chronic kidney disease, stage 3b: Secondary | ICD-10-CM | POA: Diagnosis not present

## 2024-03-13 DIAGNOSIS — M810 Age-related osteoporosis without current pathological fracture: Secondary | ICD-10-CM | POA: Diagnosis not present

## 2024-03-13 DIAGNOSIS — Z952 Presence of prosthetic heart valve: Secondary | ICD-10-CM

## 2024-03-13 MED ORDER — WARFARIN SODIUM 3 MG PO TABS
ORAL_TABLET | ORAL | 1 refills | Status: AC
Start: 2024-03-13 — End: ?

## 2024-03-13 NOTE — Telephone Encounter (Signed)
*  STAT* If patient is at the pharmacy, call can be transferred to refill team.   1. Which medications need to be refilled? (please list name of each medication and dose if known) warfarin (COUMADIN ) 3 MG tablet    4. Which pharmacy/location (including street and city if local pharmacy) is medication to be sent to?  [id number] - [place], [place] - [address] Phone: [phone]  Fax: [phone]       5. Do they need a 30 day or 90 day supply? 90

## 2024-03-13 NOTE — Telephone Encounter (Signed)
 Refill request for warfarin:  Last INR was 1.9 on 03/01/24 Next INR due 03/15/24 LOV was 02/21/24  Refill approved.

## 2024-03-15 ENCOUNTER — Ambulatory Visit: Attending: Cardiovascular Disease | Admitting: *Deleted

## 2024-03-15 DIAGNOSIS — Z7901 Long term (current) use of anticoagulants: Secondary | ICD-10-CM | POA: Insufficient documentation

## 2024-03-15 DIAGNOSIS — Z5181 Encounter for therapeutic drug level monitoring: Secondary | ICD-10-CM | POA: Diagnosis not present

## 2024-03-15 DIAGNOSIS — E6689 Other obesity not elsewhere classified: Secondary | ICD-10-CM | POA: Diagnosis not present

## 2024-03-15 DIAGNOSIS — N1832 Chronic kidney disease, stage 3b: Secondary | ICD-10-CM | POA: Diagnosis not present

## 2024-03-15 DIAGNOSIS — Z952 Presence of prosthetic heart valve: Secondary | ICD-10-CM | POA: Diagnosis not present

## 2024-03-15 DIAGNOSIS — Z6832 Body mass index (BMI) 32.0-32.9, adult: Secondary | ICD-10-CM | POA: Diagnosis not present

## 2024-03-15 DIAGNOSIS — E1140 Type 2 diabetes mellitus with diabetic neuropathy, unspecified: Secondary | ICD-10-CM | POA: Diagnosis not present

## 2024-03-15 LAB — POCT INR: POC INR: 1.5

## 2024-03-15 NOTE — Patient Instructions (Signed)
 Description   INR 1.5,  Take 1.5 tablets of warfarin today and tomorrow The continue taking warfarin 1 tablet daily except for 1/2 a tablet on Sunday, Tuesday and Thursdays.  Repeat INR in 2 weeks.  Coumadin  Clinic [phone]

## 2024-03-15 NOTE — Progress Notes (Signed)
 Lab Results  Component Value Date   INR 1.5 03/15/2024   INR 1.9 03/01/2024   INR 2.3 02/09/2024    Description   INR 1.5,  Take 1.5 tablets of warfarin today and tomorrow The continue taking warfarin 1 tablet daily except for 1/2 a tablet on Sunday, Tuesday and Thursdays.  Repeat INR in 2 weeks.  Coumadin  Clinic [phone]

## 2024-03-21 ENCOUNTER — Telehealth: Payer: Self-pay | Admitting: Physician Assistant

## 2024-03-21 ENCOUNTER — Encounter: Payer: Self-pay | Admitting: Professional Counselor

## 2024-03-21 ENCOUNTER — Ambulatory Visit (INDEPENDENT_AMBULATORY_CARE_PROVIDER_SITE_OTHER): Admitting: Professional Counselor

## 2024-03-21 DIAGNOSIS — F411 Generalized anxiety disorder: Secondary | ICD-10-CM | POA: Diagnosis not present

## 2024-03-21 DIAGNOSIS — F330 Major depressive disorder, recurrent, mild: Secondary | ICD-10-CM | POA: Diagnosis not present

## 2024-03-21 NOTE — Progress Notes (Signed)
      [facility] Counselor/Therapist Progress Note  Patient ID: Abigail Oliver, MRN: [id number],    Date: 03/21/2024  Time Spent: 11:15 AM to 12:15 PM  Treatment Type: Individual Therapy  Reported Symptoms: Memory concerns, health concerns, fatigue, motivational concerns, anhedonia, low mood, sleep concerns, appetite concerns, self-esteem concerns, restlessness, anxiousness, worries, catastrophic thinking, phase of life concerns  Mental Status Exam:  Appearance:   Casual     Behavior:  Appropriate, Sharing, and Drowsy  Motor:  Normal  Speech/Language:   Clear and Coherent and Normal Rate  Affect:  Appropriate and Congruent  Mood:  normal  Thought process:  normal  Thought content:    WNL  Sensory/Perceptual disturbances:    WNL  Orientation:  oriented to person, place, time/date, and situation  Attention:  Good  Concentration:  Good  Memory:  WNL  Fund of knowledge:   Good  Insight:    Good  Judgment:   Good  Impulse Control:  Good   Risk Assessment: Danger to Self:  No Self-injurious Behavior: No Danger to Others: No Duty to Warn:no Physical Aggression / Violence:No  Access to Firearms a concern: No  Gang Involvement:No   Subjective: Patient presented to session to address concerns of anxiety and depression.  She reported minimal progress at this time.  Patient identified experience of memory concerns; counselor did not observe issues with this concern during session, however.  Patient identified exacerbated health concerns including feeling locked up in terms of mobility, feeling out of breath, having exacerbated neuropathy, challenges with her hearing, the clot in her knee, and generally not feeling well.  She identified everything is being hard right now, and her husband is also struggling with his health.  She identified decrease in cooking and other self and home care needs.  Counselor encouraged patient to continue with regular medical care, and encouraged  patient to seek emergency services should symptoms exacerbate; counselor expressed her concern for patient wellbeing.  Patient identified continuing to crochet, identifying this is helpful with her concentration and to be pleasurable.  Patient identified ongoing stress within family and tendency to do too much for others; counselor encouraged patient to take rest and prioritize her and husband's care. Counselor facilitated PHQ-9, patient scored a 15, and GAD-7, patient scored a 15 as well.  Counselor and patient discussed results, and treatment plan renewal.  Patient gave consent to renewed treatment plan.  Interventions: Solution-Oriented/Positive Psychology, Humanistic/Existential, Insight-Oriented, and Treatment Planning  Diagnosis:   ICD-10-CM   1. Generalized anxiety disorder  F41.1     2. Major depressive disorder, recurrent episode, mild  F33.0       Plan: Patient is scheduled for follow-up; continue process work and developing coping skills.  Patient short-term goal between sessions to continue with regular medical care, emergency as needed, prioritize self and husband's care, and limit over functioning for others.  Continue with pleasant activities such as crochet.  Abigail Oliver, [facility]

## 2024-03-21 NOTE — Telephone Encounter (Signed)
 She saw Betsey this morning and reported insomnia, said she can't take amitriptyline bc is contraindicated. She mentioned an increase in Zoloft  too. Please give her a call to get more info.  Thanks.

## 2024-03-21 NOTE — Telephone Encounter (Signed)
 LVM to [facility]

## 2024-03-22 NOTE — Telephone Encounter (Signed)
 Pt has multiple medical problems, cardiac, vascular, kidney. Reports the amitriptyline is contraindicated, Seroquel  is contraindicated. She is taking trazodone  150 mg from PCP. She said it makes her sleepy, but doesn't get her to sleep or keep her asleep. She has not had sleep study yet. She reports crying every night. Rates anxiety at 7/10 and depression at 7/10. Stressors of daughter with legal issues and trying to repair a relationship with her. She asks about increasing sertraline .

## 2024-03-23 NOTE — Telephone Encounter (Signed)
 LVM per DPR with medication recommendations.

## 2024-03-23 NOTE — Telephone Encounter (Signed)
 Yes increase the Sertraline  100 mg to 2 pills daily. Also increase the Trazodone  150 mg to 1.5 pills (225 mg)

## 2024-03-28 ENCOUNTER — Encounter: Payer: Self-pay | Admitting: Physician Assistant

## 2024-03-28 ENCOUNTER — Ambulatory Visit: Admitting: Professional Counselor

## 2024-03-28 ENCOUNTER — Ambulatory Visit: Admitting: Physician Assistant

## 2024-03-28 DIAGNOSIS — G4700 Insomnia, unspecified: Secondary | ICD-10-CM | POA: Diagnosis not present

## 2024-03-28 DIAGNOSIS — R5382 Chronic fatigue, unspecified: Secondary | ICD-10-CM

## 2024-03-28 DIAGNOSIS — R5381 Other malaise: Secondary | ICD-10-CM | POA: Diagnosis not present

## 2024-03-28 DIAGNOSIS — F4323 Adjustment disorder with mixed anxiety and depressed mood: Secondary | ICD-10-CM

## 2024-03-28 MED ORDER — MAGNESIUM GLYCINATE 100 MG PO CAPS: 100.0000 mg | ORAL_CAPSULE | Freq: Every evening | ORAL | Status: AC

## 2024-03-28 NOTE — Progress Notes (Unsigned)
 [facility]  Patient ID: Abigail Oliver,  MRN: [id number]  PCP: Medina-Vargas, Monina C, NP  Date of Evaluation: 03/28/2024 Time spent:25 minutes  Chief Complaint:  Chief Complaint   Insomnia; Follow-up    HISTORY/CURRENT STATUS: HPI  For routine med check.  Didn't take the Elavil or Seroquel -pharm and Endo said not to   Needs help falling asleep   Individual Medical History/ Review of Systems: Changes? :No     Past medications for mental health diagnoses include: Zoloft , Ativan -unsure of effect, Cymbalta ,  Mirtzapine took 1 time and it didn't help and may have affected her kidneys,  Trazodone  hasn't helped at lower doses but at higher doses caused extreme drowsiness the next day. Ambien  caused nightmares. Melatonin helps her mind shut down but doesn't help sleep. Hydroxyzine  didn't help. She didn't take Seroquel  or Elavil b/c possible med interactions.   Had 3 sleep studies over the years.   Allergies: Atorvastatin , Dapagliflozin, Gabapentin , Simvastatin , Sulfa  antibiotics, and Iodinated contrast media  Current Medications:  Current Outpatient Medications:    Ascorbic Acid  (VITAMIN C ) 1000 MG tablet, Take 1,000 mg by mouth every evening., Disp: , Rfl:    aspirin  EC 81 MG tablet, Take 81 mg by mouth every evening., Disp: , Rfl:    Blood Glucose Monitoring Suppl DEVI, 1 each by Does not apply route daily. May substitute to any manufacturer covered by patient's insurance., Disp: 1 each, Rfl: 0   cyanocobalamin  (VITAMIN B12) 1000 MCG tablet, Take 1,000 mcg by mouth daily., Disp: , Rfl:    folic acid  (FOLVITE ) 800 MCG tablet, Take 800 mcg by mouth daily., Disp: , Rfl:    Glucose Blood (BLOOD GLUCOSE TEST STRIPS) STRP, 1 each by In Vitro route daily. May substitute to any manufacturer covered by patient's insurance., Disp: 100 strip, Rfl: 3   Lancet Device MISC, 1 each by Does not apply route daily. May substitute to any manufacturer covered by patient's insurance.,  Disp: 1 each, Rfl: 0   Lancets Misc. MISC, 1 each by Does not apply route daily. May substitute to any manufacturer covered by patient's insurance., Disp: 100 each, Rfl: 3   latanoprost  (XALATAN ) 0.005 % ophthalmic solution, PLACE 1 DROP INTO BOTH EYES AT BEDTIME., Disp: 2.5 mL, Rfl: 2   metoprolol  succinate (TOPROL  XL) 50 MG 24 hr tablet, Take 1 tablet (50 mg total) by mouth daily. Take with or immediately following a meal., Disp: 90 tablet, Rfl: 3   Multiple Vitamin (MULTIVITAMIN WITH MINERALS) TABS tablet, Take 2 tablets by mouth daily., Disp: , Rfl:    nitroGLYCERIN  (NITROSTAT ) 0.4 MG SL tablet, Place 1 tablet (0.4 mg total) under the tongue every 5 (five) minutes as needed for chest pain., Disp: 20 tablet, Rfl: 12   pantoprazole  (PROTONIX ) 40 MG tablet, Take 1 tablet (40 mg total) by mouth daily., Disp: , Rfl:    potassium chloride  SA (KLOR-CON  M20) 20 MEQ tablet, Take 1 tablet (20 mEq total) by mouth daily., Disp: , Rfl:    rosuvastatin  (CRESTOR ) 20 MG tablet, Take 1 tablet (20 mg total) by mouth every evening., Disp: 90 tablet, Rfl: 1   sertraline  (ZOLOFT ) 100 MG tablet, Take 1.5 tablets (150 mg total) by mouth daily. (Patient taking differently: Take 200 mg by mouth daily.), Disp: 135 tablet, Rfl: 1   torsemide  (DEMADEX ) 20 MG tablet, Take 1 tablet (20 mg total) by mouth daily. increase her Torsemide  to 20mg  twice daily for 3 days THEN BACK TO ONCE DAILY, Disp: , Rfl:  traZODone  (DESYREL ) 100 MG tablet, Take 200 mg by mouth at bedtime as needed., Disp: , Rfl:    valsartan  (DIOVAN ) 40 MG tablet, TAKE 1 TABLET BY MOUTH DAILY, Disp: 90 tablet, Rfl: 2   warfarin (COUMADIN ) 3 MG tablet, TAKE 1/2 TO 1 TABLET BY MOUTH DAILY AS DIRECTED, Disp: 90 tablet, Rfl: 1   amitriptyline (ELAVIL) 25 MG tablet, Take 1 tablet (25 mg total) by mouth at bedtime., Disp: 30 tablet, Rfl: 1   fluticasone  (FLONASE ) 50 MCG/ACT nasal spray, Place 2 sprays into both nostrils as needed for allergies or rhinitis., Disp: ,  Rfl:    Psyllium (METAMUCIL) 0.36 g CAPS, Take 1 tablet by mouth daily as needed., Disp: , Rfl:    QUEtiapine  (SEROQUEL ) 25 MG tablet, Take 1 tablet (25 mg total) by mouth at bedtime. (Patient not taking: Reported on 02/21/2024), Disp: 30 tablet, Rfl: 1 Medication Side Effects: none  Family Medical/ Social History: Changes?    MENTAL HEALTH EXAM:  There were no vitals taken for this visit.There is no height or weight on file to calculate BMI.  General Appearance: Casual and Well Groomed  Eye Contact:  Good  Speech:  Clear and Coherent and Normal Rate  Volume:  Normal  Mood:  Euthymic  Affect:  Congruent  Thought Process:  Goal Directed and Descriptions of Associations: Circumstantial  Orientation:  Full (Time, Place, and Person)  Thought Content: Logical   Suicidal Thoughts:  No  Homicidal Thoughts:  No  Memory:  WNL  Judgement:  Good  Insight:  Good  Psychomotor Activity:  Walks slowly with a four-pronged cane  Concentration:  Concentration: Good  Recall:  Good  Fund of Knowledge: Good  Language: Good  Assets:  Communication Skills Desire for Improvement Financial Resources/Insurance Housing Transportation Vocational/Educational  ADL's:  Intact  Cognition: WNL  Prognosis:  Good   Neck circumference  14.5 inches  Epworth Sleepiness Scale score was only 2 with a slight chance of nodding off while watching TV and a slight chance of nodding off after lying down to rest.  Patient states she does not do any of the other activities like sit and read, be a passenger in a car for over an hour, sit and talk to someone, sit quietly after a meal without alcohol , or even driving any distance.  So these results are bit skewed and do not reflect her sleep time or quality.  DIAGNOSES:  No diagnosis found.   Receiving Psychotherapy: Yes with Cato Sprang, [facility]  RECOMMENDATIONS:   PDMP reviewed.  Ativan  filled 01/27/2024.    Refer to Dr. Chalice.  Add Magnesium.               Continue  Zoloft  100 mg, 1.5 pills daily  Continue therapy with Cato Sprang, [facility].   Return in 6 to 8 weeks.    Verneita Cooks, PA-C

## 2024-03-29 ENCOUNTER — Ambulatory Visit

## 2024-03-30 ENCOUNTER — Ambulatory Visit: Admitting: Professional Counselor

## 2024-04-02 ENCOUNTER — Encounter: Payer: Self-pay | Admitting: Adult Health

## 2024-04-02 ENCOUNTER — Ambulatory Visit (INDEPENDENT_AMBULATORY_CARE_PROVIDER_SITE_OTHER): Payer: Self-pay | Admitting: Adult Health

## 2024-04-02 VITALS — BP 128/74 | HR 56 | Temp 97.6°F | Ht 62.0 in | Wt 171.6 lb

## 2024-04-02 DIAGNOSIS — I5032 Chronic diastolic (congestive) heart failure: Secondary | ICD-10-CM

## 2024-04-02 DIAGNOSIS — F32A Depression, unspecified: Secondary | ICD-10-CM | POA: Diagnosis not present

## 2024-04-02 DIAGNOSIS — I129 Hypertensive chronic kidney disease with stage 1 through stage 4 chronic kidney disease, or unspecified chronic kidney disease: Secondary | ICD-10-CM | POA: Diagnosis not present

## 2024-04-02 DIAGNOSIS — I25119 Atherosclerotic heart disease of native coronary artery with unspecified angina pectoris: Secondary | ICD-10-CM

## 2024-04-02 DIAGNOSIS — Z952 Presence of prosthetic heart valve: Secondary | ICD-10-CM | POA: Diagnosis not present

## 2024-04-02 DIAGNOSIS — F5101 Primary insomnia: Secondary | ICD-10-CM | POA: Diagnosis not present

## 2024-04-02 DIAGNOSIS — F419 Anxiety disorder, unspecified: Secondary | ICD-10-CM

## 2024-04-02 NOTE — Progress Notes (Signed)
 [facility] clinic  Provider:  Jereld Serum DNP  Code Status:  Full Code  Goals of Care:     04/02/2024   10:09 AM  Advanced Directives  Does Patient Have a Medical Advance Directive? Yes  Type of Estate Agent of [place];Out of facility DNR (pink MOST or yellow form)  Copy of Healthcare Power of Attorney in Chart? No - copy requested     Chief Complaint  Patient presents with   Medical Management of Chronic Issues    3 month follow up    Insomnia    Nightly    Depression    She is seeing someone.    Discussed the use of AI scribe software for clinical note transcription with the patient, who gave verbal consent to proceed.  HPI: Patient is a 78 y.o. female seen today for a 59-month follow up of chronic medical issues. She is accompanied by her husband.  She experiences persistent insomnia, sleeping only three to four hours per night despite taking trazodone  200 mg at bedtime. She feels exhausted and has attempted to improve her sleep environment by dimming lights, but continues to struggle with sleep.  She has a history of depression and anxiety, managed with sertraline , which was recently increased to 200 mg daily.  She has coronary artery disease and is currently asymptomatic with no chest pain. Her regimen includes baby aspirin  81 mg daily and rosuvastatin  20 mg every evening. She reports that she needs to have a stent placed in her right leg and a carotid endarterectomy in February.  She takes valsartan  40 mg daily and torsemide  20 mg daily for chronic diastolic heart failure and hypertension, and reports no swelling or shortness of breath.  Her most recent A1c is 6.1, improved from 7.1 ten months ago, and she is not on diabetic medications.  She experiences gastrointestinal issues, including urgency after eating and frequent bowel movements, which affect her dining out habits.  She has peripheral arterial disease and reports decreased sensation  in her feet. She has a history of elevated triglycerides and is on a high dose of Crestor .  She follows a restricted diet due to Coumadin  use and has consulted a diabetic nutritionist. She tries to incorporate vegetables into her diet despite restrictions.    Past Medical History:  Diagnosis Date   Anemia    Anxiety    Aortic stenosis    a. Now has St Jude mechanical aortic valve (6/00). b. Echo (6/15) with EF 60-65%, mechanical aortic valve with mean gradient 27 mmHg.   Arthritis    a. Possible c-spine arthritis with pain down left arm.     Carotid artery disease    a. Carotid dopplers (7/16) with 40-59% BICA stenosis.     Chronic angle-closure glaucoma(365.23)    Chronic diastolic CHF (congestive heart failure) (HCC)    a.  RHC (7/15) with mean RA 2, PA 22/11, mean PCWP 9, CI 3.79.   Contrast media allergy    Coronary atherosclerosis of native coronary artery    a. CABG at time of AVR in 6/00 with RIMA-RCA. b. Abnl nuc 11/2013 -> LHC (7/15) with patent RIMA-RCA, 80% mRCA, 80% pLAD with FFR 0.73, treated with DES to pLAD.   Depression    Essential hypertension    GERD (gastroesophageal reflux disease)    Hyperlipidemia    Low back pain    Neuromuscular disorder (HCC)    neuropathy   Peripheral vascular disease    a, H/o Right  SFA stent. b. Peripheral arterial dopplers (7/16) with right SFA stent patent.     PONV (postoperative nausea and vomiting)    N&V   Type II diabetes mellitus (HCC)     Past Surgical History:  Procedure Laterality Date   BILATERAL OOPHORECTOMY  05/24/1985   BREAST BIOPSY  05/24/2010   left   CARDIAC CATHETERIZATION  01/14/1999   normal LV function, severe aortic stenosis; 80% and 70% stenosis in RCA; mild 20% distal norrowing in L main with 20% proximal LAD stenosis, 40% diagonal stenosis and 20% proximal circumflex stenosis   CARDIAC VALVE REPLACEMENT  05/24/1998   aortic valve    CARDIOVASCULAR STRESS TEST  08/25/2011   R/L MV - EF 72%; no  scintigraphic evidence of inducible MI; normal perfusionTID of 1.25 elevated - could indicate small vessle subendocardial ischemia; EKG NSR at 66, non diagnostic for ischemia   CARPAL TUNNEL RELEASE  05/25/1987   bilaterally   CATARACT EXTRACTION W/ INTRAOCULAR LENS  IMPLANT, BILATERAL  ~ 2007   CESAREAN SECTION  1969; 1971   CHOLECYSTECTOMY  05/24/1988   COLONOSCOPY N/A 10/27/2012   Procedure: COLONOSCOPY;  Surgeon: Belvie JONETTA Just, MD;  Location: [facility];  Service: Endoscopy;  Laterality: N/A;   CORONARY ARTERY BYPASS GRAFT  05/24/1998   RIMA to RCA   CORONARY/GRAFT ANGIOGRAPHY N/A 09/07/2021   Procedure: CORONARY/GRAFT ANGIOGRAPHY;  Surgeon: Wendel Lurena POUR, MD;  Location: [facility];  Service: Cardiovascular;  Laterality: N/A;   DOPPLER ECHOCARDIOGRAPHY  03/29/2012   EF >55%; mild concentric LVH; stage 1 diastolic dysfunction, elevated LV filling pressure, dilated LA; MAC mild MR; St Jude AVR peak and mean gradients of and ; transvalvular gradients have increased (prev 23 and 14 respectively)   ESOPHAGOGASTRODUODENOSCOPY N/A 10/27/2012   Procedure: ESOPHAGOGASTRODUODENOSCOPY (EGD);  Surgeon: Belvie JONETTA Just, MD;  Location: [facility];  Service: Endoscopy;  Laterality: N/A;   FEMORAL ARTERY STENT  09/20/2011   6 x 40 Smart Nitinol self-expanding stent placed;  10/15/2031 -R SFA stent open and patent w/o evidence of restenosis   FRACTIONAL FLOW RESERVE WIRE  12/14/2013   Procedure: FRACTIONAL FLOW RESERVE WIRE;  Surgeon: Ezra GORMAN Shuck, MD;  Location[facility];  Service: Cardiovascular;;   KNEE SURGERY Left    LACERATION REPAIR     right hand   LEFT AND RIGHT HEART CATHETERIZATION WITH CORONARY ANGIOGRAM N/A 12/14/2013   Procedure: LEFT AND RIGHT HEART CATHETERIZATION WITH CORONARY ANGIOGRAM;  Surgeon: Ezra GORMAN Shuck, MD;  Location: [facility] CATH LAB;  Service: Cardiovascular;  Laterality: N/A;   LOWER EXTREMITY ANGIOGRAM N/A 09/20/2011   Procedure: LOWER EXTREMITY  ANGIOGRAM;  Surgeon: Dorn JINNY Lesches, MD;  Location: [facility] CATH LAB;  Service: Cardiovascular;  Laterality: N/A;   LYMPH NODE BIOPSY  06/25/2011   core needle on 5   needle biopsy  05/25/2007   on ankles for nerve damage   NEUROPLASTY / TRANSPOSITION ULNAR NERVE AT ELBOW     right   ORIF FEMUR FRACTURE Left 09/09/2021   Procedure: OPEN REDUCTION INTERNAL FIXATION (ORIF) DISTAL FEMUR FRACTURE;  Surgeon: Kendal Franky SQUIBB, MD;  Location: [facility] OR;  Service: Orthopedics;  Laterality: Left;   PERCUTANEOUS CORONARY STENT INTERVENTION (PCI-S)  12/14/2013   Procedure: PERCUTANEOUS CORONARY STENT INTERVENTION (PCI-S);  Surgeon: Ezra GORMAN Shuck, MD;  Location: [facility] CATH LAB;  Service: Cardiovascular;;  Prox LAD 3.00x12 Promus DES    PERIPHERAL ARTERIAL STENT GRAFT  09/20/2011   right SFA   REFRACTIVE SURGERY  `  2004   for glaucoma   TONSILLECTOMY  05/24/1966   TRIGGER FINGER RELEASE Left 12/04/2015   Procedure: LEFT RING FINGER TRIGGER RELEASE;  Surgeon: Franky Curia, MD;  Location: [facility];  Service: Orthopedics;  Laterality: Left;   VAGINAL HYSTERECTOMY  05/25/1983   Fibroids    Allergies  Allergen Reactions   Atorvastatin      Muscle pain   Dapagliflozin     Other reaction(s): nausea   Gabapentin      Other reaction(s): stomach issues   Simvastatin  Other (See Comments)    Muscle pain   Sulfa  Antibiotics     unknown   Iodinated Contrast Media Rash     Red rash after cardiac cath 1 wk ago, ? Contrast allergy, requires 13 hr prep now per [email or web address]      Outpatient Encounter Medications as of 04/02/2024  Medication Sig   Ascorbic Acid  (VITAMIN C ) 1000 MG tablet Take 1,000 mg by mouth every evening.   aspirin  EC 81 MG tablet Take 81 mg by mouth every evening.   Blood Glucose Monitoring Suppl DEVI 1 each by Does not apply route daily. May substitute to any manufacturer covered by patient's insurance.   Calcium  250 MG CAPS Take 250 mg by mouth.    Cholecalciferol (VITAMIN D3) 125 MCG (5000 UT) TABS Take by mouth.   cyanocobalamin  (VITAMIN B12) 1000 MCG tablet Take 1,000 mcg by mouth daily.   fluticasone  (FLONASE ) 50 MCG/ACT nasal spray Place 2 sprays into both nostrils as needed for allergies or rhinitis.   folic acid  (FOLVITE ) 800 MCG tablet Take 800 mcg by mouth daily.   Glucose Blood (BLOOD GLUCOSE TEST STRIPS) STRP 1 each by In Vitro route daily. May substitute to any manufacturer covered by patient's insurance.   Lancet Device MISC 1 each by Does not apply route daily. May substitute to any manufacturer covered by patient's insurance.   Lancets Misc. MISC 1 each by Does not apply route daily. May substitute to any manufacturer covered by patient's insurance.   latanoprost  (XALATAN ) 0.005 % ophthalmic solution PLACE 1 DROP INTO BOTH EYES AT BEDTIME.   Magnesium Glycinate 100 MG CAPS Take 100 mg by mouth at bedtime.   metoprolol  succinate (TOPROL  XL) 50 MG 24 hr tablet Take 1 tablet (50 mg total) by mouth daily. Take with or immediately following a meal.   Multiple Vitamin (MULTIVITAMIN WITH MINERALS) TABS tablet Take 2 tablets by mouth daily.   nitroGLYCERIN  (NITROSTAT ) 0.4 MG SL tablet Place 1 tablet (0.4 mg total) under the tongue every 5 (five) minutes as needed for chest pain.   pantoprazole  (PROTONIX ) 40 MG tablet Take 1 tablet (40 mg total) by mouth daily.   potassium chloride  SA (KLOR-CON  M20) 20 MEQ tablet Take 1 tablet (20 mEq total) by mouth daily.   Psyllium (METAMUCIL) 0.36 g CAPS Take 1 tablet by mouth daily as needed.   rosuvastatin  (CRESTOR ) 20 MG tablet Take 1 tablet (20 mg total) by mouth every evening.   sertraline  (ZOLOFT ) 100 MG tablet Take 1.5 tablets (150 mg total) by mouth daily. (Patient taking differently: Take 200 mg by mouth daily.)   torsemide  (DEMADEX ) 20 MG tablet Take 1 tablet (20 mg total) by mouth daily. increase her Torsemide  to 20mg  twice daily for 3 days THEN BACK TO ONCE DAILY   traZODone  (DESYREL )  100 MG tablet Take 200 mg by mouth at bedtime as needed.   valsartan  (DIOVAN ) 40 MG tablet TAKE 1 TABLET BY MOUTH DAILY   warfarin (  COUMADIN ) 3 MG tablet TAKE 1/2 TO 1 TABLET BY MOUTH DAILY AS DIRECTED   No facility-administered encounter medications on file as of 04/02/2024.    Review of Systems:  Review of Systems  Constitutional:  Negative for appetite change, chills, fatigue and fever.  HENT:  Negative for congestion, hearing loss, rhinorrhea and sore throat.   Eyes: Negative.   Respiratory:  Negative for cough, shortness of breath and wheezing.   Cardiovascular:  Negative for chest pain, palpitations and leg swelling.  Gastrointestinal:  Negative for abdominal pain, constipation, diarrhea, nausea and vomiting.  Genitourinary:  Negative for dysuria.  Musculoskeletal:  Negative for arthralgias, back pain and myalgias.  Skin:  Negative for color change, rash and wound.  Neurological:  Negative for dizziness, weakness and headaches.  Psychiatric/Behavioral:  Positive for sleep disturbance. Negative for behavioral problems. The patient is not nervous/anxious.     Health Maintenance  Topic Date Due   Medicare Annual Wellness (AWV)  05/26/2024   COVID-19 Vaccine (7 - 2025-26 season) 04/18/2024 (Originally 01/23/2024)   Influenza Vaccine  08/21/2024 (Originally 12/23/2023)   Zoster Vaccines- Shingrix (1 of 2) 01/22/2025 (Originally 09/04/1995)   HEMOGLOBIN A1C  07/04/2024   OPHTHALMOLOGY EXAM  10/23/2024   Diabetic kidney evaluation - eGFR measurement  01/01/2025   Diabetic kidney evaluation - Urine ACR  01/01/2025   FOOT EXAM  04/02/2025   DTaP/Tdap/Td (4 - Td or Tdap) 03/16/2026   Pneumococcal Vaccine: 50+ Years  Completed   DEXA SCAN  Completed   Hepatitis C Screening  Completed   Meningococcal B Vaccine  Aged Out   Mammogram  Discontinued   Colonoscopy  Discontinued    Physical Exam: Vitals:   04/02/24 1017  BP: 128/74  Pulse: (!) 56  Temp: 97.6 F (36.4 C)  TempSrc:  Temporal  SpO2: 98%  Weight: 171 lb 9.6 oz (77.8 kg)  Height: 5' 2 (1.575 m)   Body mass index is 31.39 kg/m. Physical Exam Constitutional:      General: She is not in acute distress.    Appearance: She is obese.  HENT:     Head: Normocephalic and atraumatic.     Nose: Nose normal.     Mouth/Throat:     Mouth: Mucous membranes are moist.  Eyes:     Conjunctiva/sclera: Conjunctivae normal.  Cardiovascular:     Rate and Rhythm: Normal rate and regular rhythm.  Pulmonary:     Effort: Pulmonary effort is normal.     Breath sounds: Normal breath sounds.  Abdominal:     General: Bowel sounds are normal.     Palpations: Abdomen is soft.  Musculoskeletal:        General: Normal range of motion.     Cervical back: Normal range of motion.  Skin:    General: Skin is warm and dry.  Neurological:     General: No focal deficit present.     Mental Status: She is alert and oriented to person, place, and time.  Psychiatric:        Mood and Affect: Mood normal.        Behavior: Behavior normal.        Thought Content: Thought content normal.        Judgment: Judgment normal.     Labs reviewed: Basic Metabolic Panel: Recent Labs    07/05/23 1529 07/15/23 1539 01/02/24 1037  NA 137 138 141  K 4.5 4.3 4.0  CL 98 102 102  CO2 28 26 29   GLUCOSE  173* 234* 179*  BUN 66* 48* 23  CREATININE 2.08* 1.71* 1.26*  CALCIUM  10.1 9.4 9.2   Liver Function Tests: Recent Labs    01/02/24 1037  AST 30  ALT 17  BILITOT 0.8  PROT 7.1   No results for input(s): LIPASE, AMYLASE in the last 8760 hours. No results for input(s): AMMONIA in the last 8760 hours. CBC: Recent Labs    06/24/23 1201  WBC 5.3  NEUTROABS 3,286  HGB 10.2*  HCT 32.3*  MCV 87.8  PLT 114*   Lipid Panel: Recent Labs    05/26/23 1041 01/02/24 1037  CHOL 145 132  HDL 41* 45*  LDLCALC 71 60  TRIG 253* 200*  CHOLHDL 3.5 2.9   Lab Results  Component Value Date   HGBA1C 6.1 (H) 01/02/2024     Procedures since last visit: No results found.  Assessment/Plan  1. Benign hypertension with chronic kidney disease (Primary) -  BP controlled -  continue Metoprolol  succinate 50 mg daily  2. Chronic diastolic heart failure (HCC) -  Well-managed with no symptoms. Managed with torsemide . - Continue torsemide  20 mg daily.  3. Coronary artery disease involving native heart with angina pectoris, unspecified vessel or lesion type -  No current chest pain. Managed with baby aspirin  and rosuvastatin . - Continue baby aspirin  81 mg daily. - Continue rosuvastatin  20 mg daily.  4. Primary insomnia -  Chronic insomnia with difficulty maintaining sleep despite trazodone .  - Continue trazodone  200 mg at bedtime as needed. - Advised dimming lights at night.  5. Anxiety and depression -  Managed with sertraline  200 mg daily. Reports feeling well-managed. - Continue sertraline  200 mg daily. -  follows up with psychiatry  6. History of aortic valve replacement -  continue Coumadin      Labs/tests ordered:  None   Return in about 3 months (around 07/03/2024).  Evy Lutterman Medina-Vargas, NP

## 2024-04-02 NOTE — Patient Instructions (Signed)
 Please schedule annual wellness visit at checkout.

## 2024-04-04 ENCOUNTER — Ambulatory Visit: Attending: Cardiovascular Disease | Admitting: Pharmacist

## 2024-04-04 DIAGNOSIS — Z952 Presence of prosthetic heart valve: Secondary | ICD-10-CM | POA: Insufficient documentation

## 2024-04-04 DIAGNOSIS — Z7901 Long term (current) use of anticoagulants: Secondary | ICD-10-CM | POA: Insufficient documentation

## 2024-04-04 LAB — POCT INR: INR: 2.5 (ref 2.0–3.0)

## 2024-04-04 NOTE — Progress Notes (Signed)
 Description   INR 2.5 Take 1/2 tablet on Sundays, Tuesdays, and Thursdays, 1 whole tablet all other days of the week    Repeat INR in 3 weeks.  [facility]in  Clinic [phone]

## 2024-04-04 NOTE — Patient Instructions (Signed)
 Description   INR 2.5 Take 1/2 tablet on Sundays, Tuesdays, and Thursdays, 1 whole tablet all other days of the week    Repeat INR in 3 weeks.  [facility]  Clinic [phone]

## 2024-04-13 DIAGNOSIS — E1140 Type 2 diabetes mellitus with diabetic neuropathy, unspecified: Secondary | ICD-10-CM | POA: Diagnosis not present

## 2024-04-13 DIAGNOSIS — E6609 Other obesity due to excess calories: Secondary | ICD-10-CM | POA: Diagnosis not present

## 2024-04-13 DIAGNOSIS — Z6831 Body mass index (BMI) 31.0-31.9, adult: Secondary | ICD-10-CM | POA: Diagnosis not present

## 2024-04-13 DIAGNOSIS — N1832 Chronic kidney disease, stage 3b: Secondary | ICD-10-CM | POA: Diagnosis not present

## 2024-04-17 ENCOUNTER — Ambulatory Visit: Admitting: Physician Assistant

## 2024-04-25 ENCOUNTER — Ambulatory Visit: Attending: Cardiovascular Disease | Admitting: *Deleted

## 2024-04-25 DIAGNOSIS — Z952 Presence of prosthetic heart valve: Secondary | ICD-10-CM | POA: Diagnosis not present

## 2024-04-25 DIAGNOSIS — Z7901 Long term (current) use of anticoagulants: Secondary | ICD-10-CM | POA: Diagnosis not present

## 2024-04-25 LAB — POCT INR: INR: 1.6 — AB (ref 2.0–3.0)

## 2024-04-25 NOTE — Progress Notes (Signed)
 Description   INR-1.6; Today take 1.5 tablets of warfarin and tomorrow take 1 tablet of warfarin then start taking the dose you should be taking, which is, 1 tablet daily except 1/2 tablet on Sundays, Tuesdays, and Thursdays.  Repeat INR in 3 weeks.  [facility]  Clinic [phone]

## 2024-04-25 NOTE — Patient Instructions (Signed)
 Description   INR-1.6; Today take 1.5 tablets of warfarin and tomorrow take 1 tablet of warfarin then start taking the dose you should be taking, which is, 1 tablet daily except 1/2 tablet on Sundays, Tuesdays, and Thursdays.  Repeat INR in 3 weeks.  [facility]  Clinic [phone]

## 2024-04-26 ENCOUNTER — Encounter: Payer: Self-pay | Admitting: Professional Counselor

## 2024-04-26 ENCOUNTER — Ambulatory Visit: Admitting: Professional Counselor

## 2024-04-26 DIAGNOSIS — F330 Major depressive disorder, recurrent, mild: Secondary | ICD-10-CM | POA: Diagnosis not present

## 2024-04-26 DIAGNOSIS — F411 Generalized anxiety disorder: Secondary | ICD-10-CM

## 2024-04-26 NOTE — Progress Notes (Signed)
"   °      [facility] Counselor/Therapist Progress Note  Patient ID: Abigail Oliver, MRN: [id number],    Date: 04/26/2024  Time Spent: 2:06 PM to 3:07 PM  Treatment Type: Individual Therapy  Reported Symptoms: fatigue, sleeplessness, low mood, anhedonia, low motivation, restlessness, anxiousness, phase of life concerns, stress, family/interpersonal concerns, health concerns, worries  Mental Status Exam:  Appearance:   Casual     Behavior:  Appropriate, Sharing, and Motivated  Motor:  Normal  Speech/Language:   Clear and Coherent and Normal Rate  Affect:  Appropriate and Congruent  Mood:  normal  Thought process:  normal  Thought content:    WNL  Sensory/Perceptual disturbances:    WNL  Orientation:  oriented to person, place, time/date, and situation  Attention:  Good  Concentration:  Good  Memory:  WNL  Fund of knowledge:   Good  Insight:    Good  Judgment:   Good  Impulse Control:  Good   Risk Assessment: Danger to Self:  No Self-injurious Behavior: No Danger to Others: No Duty to Warn:no Physical Aggression / Violence:No  Access to Firearms a concern: No  Gang Involvement:No   Subjective: Patient presented to session to address concerns of anxiety and depression.  She reported progress at this time.  Patient identified her prescribing provider to have ordered a sleep study for her.  Patient identified to be feeling better since last session, to be staying busy working, and to be experiencing improvement of behavior via her daughter, which in turn improves her own mood.  She processed experience of varying challenges within family system related to practical matters, and voiced gladness for healthy problem-solving and communication.  Counselor actively listened, affirmed patient feelings and experience, and reinforced patient progress, self-care, and proactive efforts.    Interventions: Solution-Oriented/Positive Psychology, Humanistic/Existential, and  Insight-Oriented  Diagnosis:   ICD-10-CM   1. Generalized anxiety disorder  F41.1     2. Major depressive disorder, recurrent episode, mild  F33.0       Plan: Patient is scheduled for follow-up; continue process work and developing coping skills.  Patient short-term goal between sessions to continue self-care activities such as staying busy as she feels healthy, reading and crochet; continue to be mindful of interpersonal boundaries.  [name], [facility]                   "

## 2024-05-11 ENCOUNTER — Ambulatory Visit: Admitting: Physician Assistant

## 2024-05-11 ENCOUNTER — Ambulatory Visit: Admitting: Professional Counselor

## 2024-05-11 ENCOUNTER — Encounter: Payer: Self-pay | Admitting: Physician Assistant

## 2024-05-11 DIAGNOSIS — F4323 Adjustment disorder with mixed anxiety and depressed mood: Secondary | ICD-10-CM | POA: Diagnosis not present

## 2024-05-11 DIAGNOSIS — G4700 Insomnia, unspecified: Secondary | ICD-10-CM | POA: Diagnosis not present

## 2024-05-11 MED ORDER — TRAZODONE HCL 100 MG PO TABS: 100.0000 mg | ORAL_TABLET | Freq: Every evening | ORAL | 1 refills | Status: AC | PRN

## 2024-05-11 MED ORDER — SERTRALINE HCL 100 MG PO TABS: 250.0000 mg | ORAL_TABLET | Freq: Every day | ORAL | 1 refills | Status: AC

## 2024-05-11 NOTE — Progress Notes (Unsigned)
 "     [facility] Med Check  Patient ID: Abigail Oliver,  MRN: [id number]  PCP: [name], NP  Date of Evaluation: 05/11/2024 Time spent:20 minutes  Chief Complaint:  Chief Complaint   Anxiety; Follow-up    HISTORY/CURRENT STATUS: HPI  For routine med check.  Sleeping a little better. She increased the Trazodone  to 125 mg last night. It helped. She took up to 200 mg but it caused her to be way too tired the next day.  Mood is the same.  Still struggles w/ issues with her daughter and her ex. Her dtr may have to sell her house and the ex will get half. [name] feels down a lot. Drags herself to get out of bed and to do things at home.  She likes having appts so it'll get her out of the house.  No feelings of hopelessness.  ADLs and personal hygiene are normal.  No change in memory.  Appetite has not changed.  Weight is stable.   No mania, delirium, AH/VH.  No SI/HI.  Individual Medical History/ Review of Systems: Changes? :No     Past medications for mental health diagnoses include: Zoloft , Ativan -unsure of effect, Cymbalta ,  Mirtzapine took 1 time and it didn't help and may have affected her kidneys,  Trazodone  hasn't helped at lower doses but at higher doses caused extreme drowsiness the next day. Ambien  caused nightmares. Melatonin helps her mind shut down but doesn't help sleep. Hydroxyzine  didn't help. She didn't take Seroquel  or Elavil  b/c possible med interactions.   Had 3 sleep studies over the years.   Allergies: Atorvastatin , Dapagliflozin, Gabapentin , Simvastatin , Sulfa  antibiotics, and Iodinated contrast media  Current Medications:  Current Outpatient Medications:    Ascorbic Acid  (VITAMIN C ) 1000 MG tablet, Take 1,000 mg by mouth every evening., Disp: , Rfl:    aspirin  EC 81 MG tablet, Take 81 mg by mouth every evening., Disp: , Rfl:    Blood Glucose Monitoring Suppl DEVI, 1 each by Does not apply route daily. May substitute to any manufacturer covered by  patient's insurance., Disp: 1 each, Rfl: 0   Calcium  250 MG CAPS, Take 250 mg by mouth., Disp: , Rfl:    Cholecalciferol (VITAMIN D3) 125 MCG (5000 UT) TABS, Take by mouth., Disp: , Rfl:    cyanocobalamin  (VITAMIN B12) 1000 MCG tablet, Take 1,000 mcg by mouth daily., Disp: , Rfl:    fluticasone  (FLONASE ) 50 MCG/ACT nasal spray, Place 2 sprays into both nostrils as needed for allergies or rhinitis., Disp: , Rfl:    folic acid  (FOLVITE ) 800 MCG tablet, Take 800 mcg by mouth daily., Disp: , Rfl:    Glucose Blood (BLOOD GLUCOSE TEST STRIPS) STRP, 1 each by In Vitro route daily. May substitute to any manufacturer covered by patient's insurance., Disp: 100 strip, Rfl: 3   Lancet Device MISC, 1 each by Does not apply route daily. May substitute to any manufacturer covered by patient's insurance., Disp: 1 each, Rfl: 0   Lancets Misc. MISC, 1 each by Does not apply route daily. May substitute to any manufacturer covered by patient's insurance., Disp: 100 each, Rfl: 3   latanoprost  (XALATAN ) 0.005 % ophthalmic solution, PLACE 1 DROP INTO BOTH EYES AT BEDTIME., Disp: 2.5 mL, Rfl: 2   Magnesium  Glycinate 100 MG CAPS, Take 100 mg by mouth at bedtime., Disp: , Rfl:    metoprolol  succinate (TOPROL  XL) 50 MG 24 hr tablet, Take 1 tablet (50 mg total) by mouth daily. Take with  or immediately following a meal., Disp: 90 tablet, Rfl: 3   Multiple Vitamin (MULTIVITAMIN WITH MINERALS) TABS tablet, Take 2 tablets by mouth daily., Disp: , Rfl:    nitroGLYCERIN  (NITROSTAT ) 0.4 MG SL tablet, Place 1 tablet (0.4 mg total) under the tongue every 5 (five) minutes as needed for chest pain., Disp: 20 tablet, Rfl: 12   pantoprazole  (PROTONIX ) 40 MG tablet, Take 1 tablet (40 mg total) by mouth daily., Disp: , Rfl:    potassium chloride  SA (KLOR-CON  M20) 20 MEQ tablet, Take 1 tablet (20 mEq total) by mouth daily., Disp: , Rfl:    Psyllium (METAMUCIL) 0.36 g CAPS, Take 1 tablet by mouth daily as needed., Disp: , Rfl:    rosuvastatin   (CRESTOR ) 20 MG tablet, Take 1 tablet (20 mg total) by mouth every evening., Disp: 90 tablet, Rfl: 1   torsemide  (DEMADEX ) 20 MG tablet, Take 1 tablet (20 mg total) by mouth daily. increase her Torsemide  to 20mg  twice daily for 3 days THEN BACK TO ONCE DAILY, Disp: , Rfl:    valsartan  (DIOVAN ) 40 MG tablet, TAKE 1 TABLET BY MOUTH DAILY, Disp: 90 tablet, Rfl: 2   warfarin (COUMADIN ) 3 MG tablet, TAKE 1/2 TO 1 TABLET BY MOUTH DAILY AS DIRECTED, Disp: 90 tablet, Rfl: 1   sertraline  (ZOLOFT ) 100 MG tablet, Take 2.5 tablets (250 mg total) by mouth daily., Disp: 225 tablet, Rfl: 1   traZODone  (DESYREL ) 100 MG tablet, Take 1-2 tablets (100-200 mg total) by mouth at bedtime as needed., Disp: 180 tablet, Rfl: 1 Medication Side Effects: none  Family Medical/ Social History: Changes?    MENTAL HEALTH EXAM:  There were no vitals taken for this visit.There is no height or weight on file to calculate BMI.  General Appearance: Casual and Well Groomed  Eye Contact:  Good  Speech:  Clear and Coherent and Normal Rate  Volume:  Normal  Mood:  sad  Affect:  Congruent  Thought Process:  Goal Directed and Descriptions of Associations: Circumstantial  Orientation:  Full (Time, Place, and Person)  Thought Content: Logical   Suicidal Thoughts:  No  Homicidal Thoughts:  No  Memory:  WNL  Judgement:  Good  Insight:  Good  Psychomotor Activity:  Walks slowly with a four-pronged cane  Concentration:  Concentration: Good  Recall:  Good  Fund of Knowledge: Good  Language: Good  Assets:  Communication Skills Desire for Improvement Financial Resources/Insurance Housing Resilience Transportation Vocational/Educational  ADL's:  Intact  Cognition: WNL  Prognosis:  Good   DIAGNOSES:    ICD-10-CM   1. Situational mixed anxiety and depressive disorder  F43.23     2. Insomnia, persistent  G47.00       Receiving Psychotherapy: Yes with Cato Sprang, [facility]  RECOMMENDATIONS:   PDMP reviewed.  Ativan   filled 01/27/2024. I provided approximately  20 minutes of face to face time during this encounter, including time spent before and after the visit in records review, medical decision making, counseling pertinent to today's visit, and charting.   We discussed options. She's sleeping a little better so no changes in that tx. Rec increasing Zoloft  to help w/ anx/dep.  She'd like to try it.        Increase Zoloft  100 mg, to 2.5 pills daily. Continue trazodone  100 mg, 1-2 p.o. nightly as needed sleep.   Continue magnesium  nightly.   Continue therapy with Cato Sprang, [facility].   Return in 6 weeks.    Verneita Cooks, PA-C  "

## 2024-05-15 ENCOUNTER — Ambulatory Visit

## 2024-05-15 DIAGNOSIS — Z7901 Long term (current) use of anticoagulants: Secondary | ICD-10-CM | POA: Insufficient documentation

## 2024-05-15 DIAGNOSIS — Z952 Presence of prosthetic heart valve: Secondary | ICD-10-CM | POA: Insufficient documentation

## 2024-05-15 LAB — POCT INR: INR: 3 (ref 2.0–3.0)

## 2024-05-15 NOTE — Progress Notes (Signed)
 Description   INR-3.0; Continue 1 tablet daily except 1/2 tablet on Sundays, Tuesdays, Thursdays Repeat INR in 4 weeks.  [facility]  Clinic [phone]

## 2024-05-15 NOTE — Patient Instructions (Addendum)
 Description   INR-3.0; Continue 1 tablet daily except 1/2 tablet on Sundays, Tuesdays, Thursdays Repeat INR in 4 weeks.  [facility]  Clinic [phone]

## 2024-05-28 ENCOUNTER — Ambulatory Visit: Admitting: Adult Health

## 2024-06-01 ENCOUNTER — Encounter: Payer: Self-pay | Admitting: Professional Counselor

## 2024-06-01 ENCOUNTER — Ambulatory Visit: Admitting: Professional Counselor

## 2024-06-01 DIAGNOSIS — F331 Major depressive disorder, recurrent, moderate: Secondary | ICD-10-CM

## 2024-06-01 DIAGNOSIS — F411 Generalized anxiety disorder: Secondary | ICD-10-CM

## 2024-06-01 NOTE — Progress Notes (Signed)
"   °      [facility] Counselor/Therapist Progress Note  Patient ID: Abigail Oliver, MRN: [id number],    Date: 06/01/2024  Time Spent: 1:10 PM to 2:16 PM  Treatment Type: Individual Therapy  Reported Symptoms: fatigue, sadness, worries, anxiousness, restlessness, sleeplessness, exacerbated stress eating, health concerns, family conflict, phase of life concerns  Mental Status Exam:  Appearance:   Casual     Behavior:  Appropriate, Sharing, and Motivated  Motor:  Normal  Speech/Language:   Clear and Coherent and Normal Rate  Affect:  Tearful  Mood:  sad  Thought process:  normal  Thought content:    WNL  Sensory/Perceptual disturbances:    Hearing challenges   Orientation:  oriented to person, place, time/date, and situation  Attention:  Good  Concentration:  Good  Memory:  WNL  Fund of knowledge:   Good  Insight:    Good  Judgment:   Good  Impulse Control:  Good   Risk Assessment: Danger to Self:  No Self-injurious Behavior: No Danger to Others: No Duty to Warn:no Physical Aggression / Violence:No  Access to Firearms a concern: No  Gang Involvement:No   Subjective: Patient presented to session to address concerns of anxiety and depression.  She reported mixed progress at this time.  She reported having discontinued contact with daughter after continued behaviors of concern that implicated patient resources both emotional and financial.  Patient also identified having reconfigured aspects to her phase of life to align with her boundaries.  Counselor affirmed patient feelings and experience and proactive self-care.  Patient identified coping resource of crochet and prayer to be helpful, and voiced desire to continue with therapy every 2 weeks.  Patient identified her health to be very challenging at this time, with multiple health conditions and management as very painful, tiring and demanding.  She identified strain with her son, and worries about her husband's health.  Counselor  held space for patient process work, helped to resource patient strengths and hopefulness, and encouraged patient continued resourcing of positive coping skills and support.  Interventions: Assertiveness/Communication, Solution-Oriented/Positive Psychology, Humanistic/Existential, Insight-Oriented, and Resourcing  Diagnosis:   ICD-10-CM   1. Generalized anxiety disorder  F41.1     2. Major depressive disorder, recurrent episode, moderate (HCC)  F33.1       Plan: Patient is scheduled for follow-up; continue process work and developing coping skills.  Patient short-term goal between sessions to continue to protect herself and her wellbeing holistically, prioritize care of health including pleasant activities, rest and medical care.  [name], [facility]                   "

## 2024-06-04 ENCOUNTER — Ambulatory Visit: Admitting: Neurology

## 2024-06-04 ENCOUNTER — Encounter: Payer: Self-pay | Admitting: Neurology

## 2024-06-04 VITALS — BP 117/62 | HR 76 | Ht 62.0 in | Wt 165.0 lb

## 2024-06-04 DIAGNOSIS — I5189 Other ill-defined heart diseases: Secondary | ICD-10-CM | POA: Diagnosis not present

## 2024-06-04 DIAGNOSIS — F418 Other specified anxiety disorders: Secondary | ICD-10-CM

## 2024-06-04 DIAGNOSIS — M48061 Spinal stenosis, lumbar region without neurogenic claudication: Secondary | ICD-10-CM

## 2024-06-04 DIAGNOSIS — Z959 Presence of cardiac and vascular implant and graft, unspecified: Secondary | ICD-10-CM

## 2024-06-04 DIAGNOSIS — F99 Mental disorder, not otherwise specified: Secondary | ICD-10-CM

## 2024-06-04 DIAGNOSIS — F5105 Insomnia due to other mental disorder: Secondary | ICD-10-CM | POA: Diagnosis not present

## 2024-06-04 MED ORDER — ALPRAZOLAM 0.5 MG PO TABS: 0.5000 mg | ORAL_TABLET | Freq: Every evening | ORAL | 0 refills | Status: AC | PRN

## 2024-06-04 NOTE — Patient Instructions (Signed)
 Xanax  0.5mg  tab 2 tab ordered for sleep study, either HST or in lab,    Insomnia Insomnia is a sleep disorder that makes it difficult to fall asleep or stay asleep. Insomnia can cause fatigue, low energy, difficulty concentrating, mood swings, and poor performance at work or school. There are three different ways to classify insomnia: Difficulty falling asleep. Difficulty staying asleep. Waking up too early in the morning. Any type of insomnia can be long-term (chronic) or short-term (acute). Both are common. Short-term insomnia usually lasts for 3 months or less. Chronic insomnia occurs at least three times a week for longer than 3 months. What are the causes? Insomnia may be caused by another condition, situation, or substance, such as: Having certain mental health conditions, such as anxiety and depression. Using caffeine, alcohol , tobacco, or drugs. Having gastrointestinal conditions, such as gastroesophageal reflux disease (GERD). Having certain medical conditions. These include: Asthma. Alzheimer's disease. Stroke. Chronic pain. An overactive thyroid  gland (hyperthyroidism). Other sleep disorders, such as restless legs syndrome and sleep apnea. Menopause. Sometimes, the cause of insomnia may not be known. What increases the risk? Risk factors for insomnia include: Gender. Females are affected more often than males. Age. Insomnia is more common as people get older. Stress and certain medical and mental health conditions. Lack of exercise. Having an irregular work schedule. This may include working night shifts and traveling between different time zones. What are the signs or symptoms? If you have insomnia, the main symptom is having trouble falling asleep or having trouble staying asleep. This may lead to other symptoms, such as: Feeling tired or having low energy. Feeling nervous about going to sleep. Not feeling rested in the morning. Having trouble concentrating. Feeling  irritable, anxious, or depressed. How is this diagnosed? This condition may be diagnosed based on: Your symptoms and medical history. Your health care provider may ask about: Your sleep habits. Any medical conditions you have. Your mental health. A physical exam. How is this treated? Treatment for insomnia depends on the cause. Treatment may focus on treating an underlying condition that is causing the insomnia. Treatment may also include: Medicines to help you sleep. Counseling or therapy. Lifestyle adjustments to help you sleep better. Follow these instructions at home: Eating and drinking  Limit or avoid alcohol , caffeinated beverages, and products that contain nicotine and tobacco, especially close to bedtime. These can disrupt your sleep. Do not eat a large meal or eat spicy foods right before bedtime. This can lead to digestive discomfort that can make it hard for you to sleep. Sleep habits  Keep a sleep diary to help you and your health care provider figure out what could be causing your insomnia. Write down: When you sleep. When you wake up during the night. How well you sleep and how rested you feel the next day. Any side effects of medicines you are taking. What you eat and drink. Make your bedroom a dark, comfortable place where it is easy to fall asleep. Put up shades or blackout curtains to block light from outside. Use a white noise machine to block noise. Keep the temperature cool. Limit screen use before bedtime. This includes: Not watching TV. Not using your smartphone, tablet, or computer. Stick to a routine that includes going to bed and waking up at the same times every day and night. This can help you fall asleep faster. Consider making a quiet activity, such as reading, part of your nighttime routine. Try to avoid taking naps during the day  so that you sleep better at night. Get out of bed if you are still awake after 15 minutes of trying to sleep. Keep the  lights down, but try reading or doing a quiet activity. When you feel sleepy, go back to bed. General instructions Take over-the-counter and prescription medicines only as told by your health care provider. Exercise regularly as told by your health care provider. However, avoid exercising in the hours right before bedtime. Use relaxation techniques to manage stress. Ask your health care provider to suggest some techniques that may work well for you. These may include: Breathing exercises. Routines to release muscle tension. Visualizing peaceful scenes. Make sure that you drive carefully. Do not drive if you feel very sleepy. Keep all follow-up visits. This is important. Contact a health care provider if: You are tired throughout the day. You have trouble in your daily routine due to sleepiness. You continue to have sleep problems, or your sleep problems get worse. Get help right away if: You have thoughts about hurting yourself or someone else. Get help right away if you feel like you may hurt yourself or others, or have thoughts about taking your own life. Go to your nearest emergency room or: Call 911. Call the National Suicide Prevention Lifeline at 818-064-9548 or 988. This is open 24 hours a day. Text the Crisis Text Line at 516-721-2045. Summary Insomnia is a sleep disorder that makes it difficult to fall asleep or stay asleep. Insomnia can be long-term (chronic) or short-term (acute). Treatment for insomnia depends on the cause. Treatment may focus on treating an underlying condition that is causing the insomnia. Keep a sleep diary to help you and your health care provider figure out what could be causing your insomnia. This information is not intended to replace advice given to you by your health care provider. Make sure you discuss any questions you have with your health care provider. Document Revised: 04/20/2021 Document Reviewed: 04/20/2021 Elsevier Patient Education  2024  [employer].

## 2024-06-04 NOTE — Progress Notes (Addendum)
 . @[facility]   Provider:  Dedra Gores, MD   Primary Care Physician:  [name], NP [address] [place] [address]     Referring Provider: Rhys Verneita DASEN, Pa-c [address] [place],  [place] [address]   Primary neurologist Dr Ena, DO          Chief Complaint  Patient presents with   New Sleep Patient (Initial Visit)           HPI: Abigail Oliver is a 79 y.o.  female patient of [place] descent and seen here upon a referral by psychiatry on 06-04-2024   for a  Sleep Medicine Consultation.   Chief concern according to patient :   I am depressed, my daughter is on drugs and committed a felony to finance the habit, she failed drug screens and has been in jail. I had to go to a lawyer to protect our assets , and it cost more money than we have.   My mind is racing all night and I just can't sleep. Sertraline  helped a bit , but  it just helps with anxiety, not really sleeping well.   She reports she gets about 3-4 hrs of broken sleep a night. She gets up about 2-3 times to use the restroom. Has trouble falling asleep. Has trouble breathing when she is laying flat. No known apnea events.     I have the pleasure of seeing on 06-04-2024 who presented with a medical history of :  Memory deficits , evaluated by Dr. Skeet in 2021. SABRA  Past Medical History:  Diagnosis Date   Anemia, iron deficiency on and off    Anxiety disorder, GAD    Aortic stenosis    a. Now has St Jude mechanical aortic valve (6/00). b. Echo (6/15) with EF 60-65%, mechanical aortic valve with mean gradient 27 mmHg. Surgery in 2025    Arthritis    a. Possible c-spine arthritis with pain down left arm.     Carotid artery disease    a. Carotid dopplers (7/16) with 40-59% BICA stenosis.     Chronic angle-closure glaucoma(365.23)    Chronic diastolic CHF (congestive heart failure) (HCC)    a.  RHC (7/15) with mean RA 2, PA 22/11, mean PCWP 9, CI 3.79.   Contrast media allergy     Coronary atherosclerosis of native coronary artery    a. CABG at time of AVR in 6/00 with RIMA-RCA. b. Abnl nuc 11/2013 -> LHC (7/15) with patent RIMA-RCA, 80% mRCA, 80% pLAD with FFR 0.73, treated with DES to pLAD.   Depression    Essential hypertension    GERD (gastroesophageal reflux disease)    Hyperlipidemia    Low back pain Fracture of the left femur, fall.         Neuropathy from DM 2    Peripheral vascular disease,     a, H/o Right SFA stent. b. Peripheral arterial dopplers (7/16) with right SFA stent patent.     PONV (postoperative nausea and vomiting)    N&V   Type II diabetes mellitus (HCC)        The patient had either no previous sleep evaluations  or  over 12 years ago--Dr Dorn Lesches ordered  at the time .  Sleep relevant medical/ surgical and symptom history: The patient reports onset of insomnia symptoms over a period of 3 years, and has tried sleeping pills.  Zoloft ,  trazodone .   ENT:  had tonsillectomy, Urinary frequency,  DM 2  endocrinological disorders.    Family medical history: There are no biological family members with Sleep apnea ( ), with Insomnia () , with daytime sleepiness ( yes, sister sleeps 12- 14 hours a day ).  housebound after MVA,  trauma , paranoia, psychosis.      Social history: Patient  is retired from [employer] circuit and lives in a household with spouse  , 2 adult children, son lives  3 hours away, daughter in [place]. ). 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    The patient currently works/ part time from home  Nicotine use/ .  ETOH use /,  Caffeine intake in form of : Coffee( some days ) Soda( /) Tea ( 2 cups a day) or Energy Drinks. Caffeine is last consumed at AM .  Exercises not regularly.      Sleep habits and routines are as follows: The patient's dinner time is around 6 PM. The patient goes to bed at, or close to,  later than midnight 2-3 AM. The bedroom is not shared , and described as  cool, quiet , and dark.   She  watches TV in the bedroom.  She wakes up and plays on the phone.  Her mind starts racing.   The patient reports that it takes 60 minutes to fall asleep, then continues to sleep for 3-5 hours, wakes up every 90 minutes for  bathroom breaks.   The preferred sleep position is left side , with support of a large  pillow, wedge . She broke  her left shoulder 2 years ago.  The total estimated sleep time is circa 3-5 hours.   Dreams are reportedly frequently  night mares and can be vivid.  These can make her apprehensive.   8 -11 AM is the usual week day rise time.  There is no routine.  The patient wakes up spontaneously.She reports not feeling refreshed and restored in the morning , waking with symptoms such as dry mouth, morning headaches, stiffness or pain,  and residual fatigue.   Naps are taken rarely ( desire and opportunity), lasting from  0 minutes and are more less refreshing than nocturnal sleep.    Abigail Oliver is a 79 y.o.  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  She has also seen Dr. Rolan for diastolic heart failure.  She was referred to me through the courtesy of Dr. Charlena Sor for peripheral vascular evaluation because of lifestyle limiting claudication in her right leg.  She currently sees Dr. CLEMENTEEN Mulch. I last saw her in the office 09/21/2023.    Her other problems include hypertension, hyperlipidemia and diabetes. She has had coronary bypass grafting in 2000 as well as aortic valve replacement with a St. Jude aortic valve. Her Myoview  performed within the last several weeks with nonischemic.  Lower extremity arterial Doppler studies performed prior to her intervention revealed a right ABI of 0.81 with a high-frequency signal in the mid right SFA the left ABI 1.1.    I performed PTA and nitinol self-expanding stenting of an 80% mid right SFA stenosis on 09/20/2011.  She did well  after that although I did not see her back in the office.  She has been followed by Dr. Rolan for diastolic heart failure.  She has complained of some back hip and knee pain as well as symptoms compatible with diabetic peripheral neuropathy although in  talking to her today it does not sound as though she has claudication symptoms.  Doppler studies performed 01/23/2019 revealed normal ABIs bilaterally with a moderately elevated signal in the proximal right popliteal artery.  I suggested that we continue to follow that noninvasively and clinically.  She did break her hip and had ACS during her hospitalization.  She underwent cardiac catheterization by Dr.Thukkani 09/07/21 revealing an occluded RCA with a patent RIMA to the distal RCA, patent proximal LAD stent with no other significant CAD.  She complains of pain in her arms and legs which sounds arthritic.  She denies chest pain or shortness of breath.  Her last Doppler study performed 02/03/2022 revealed a right ABI 1.09, left of 0.92 with a moderately elevated velocity in the mid right SFA which had not changed since her prior study a year ago.   Since I saw her in the office 5 months ago she has remained stable.  She does complain of shortness of breath.  She complains of right hip pain and some numbness in her feet consistent with diabetic peripheral neuropathy.  Her ABI has however declined from 1.01-0.78 on the right with a high-frequency signal in her proximal right SFA performed 02/08/2024 although I cannot elicit a good history for claudication.     Review of Systems: Out of a complete 14 system review, the patient complains of only the following symptoms, and all other reviewed systems are negative.:  Pain: shoulder, back Depression/ anxiety:  Snoring, fragmented sleep, Nocturia/  Insomnia    How likely are you to doze in the following situations: 0 = not likely, 1 = slight chance, 2 = moderate chance, 3 = high chance  Sitting and  Reading? Watching Television? Sitting inactive in a public place (theater or meeting)? As a passenger in a car for an hour without a break? Lying down in the afternoon when circumstances permit? Sitting and talking to someone? Sitting quietly after lunch without alcohol ? In a car, while stopped for a few minutes in traffic?   Total =0  / 24 points.    FSS endorsed at 72/ 63 points.  GDS: 8/ 15   Mood and affect are appropriate.  Social History   Socioeconomic History   Marital status: Married    Spouse name: Fairy   Number of children: 2   Years of education: 14   Highest education level: Not on file  Occupational History    Comment: hair dresser  Tobacco Use   Smoking status: Never   Smokeless tobacco: Never  Vaping Use   Vaping status: Never Used  Substance and Sexual Activity   Alcohol  use: Not Currently   Drug use: Not Currently   Sexual activity: Never  Other Topics Concern   Not on file  Social History Narrative   Pt is a high school graduate with 2 years of college. Married in 1967 she has 1 son born 76 and 1 daughter born 36 and 1 grandchild. 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      Caffeine use- 2-3 /day, soda, tea       Lives with husband one story home   Social Drivers of Health   Tobacco Use: Low Risk (06/04/2024)   Patient History    Smoking Tobacco Use: Never    Smokeless Tobacco Use: Never    Passive Exposure: Not on file  Financial Resource Strain: Low Risk (07/22/2023)   Overall Financial Resource Strain (CARDIA)  Difficulty of Paying Living Expenses: Not hard at all  Food Insecurity: No Food Insecurity (07/22/2023)   Hunger Vital Sign    Worried About Running Out of Food in the Last Year: Never true    Ran Out of Food in the Last Year: Never true  Transportation Needs: No Transportation Needs (07/22/2023)   PRAPARE - Administrator, Civil Service (Medical): No    Lack of Transportation  (Non-Medical): No  Physical Activity: Inactive (07/22/2023)   Exercise Vital Sign    Days of Exercise per Week: 0 days    Minutes of Exercise per Session: 0 min  Stress: Stress Concern Present (07/22/2023)   [employer] of Occupational Health - Occupational Stress Questionnaire    Feeling of Stress : Very much  Social Connections: Socially Isolated (07/22/2023)   Social Connection and Isolation Panel    Frequency of Communication with Friends and Family: Once a week    Frequency of Social Gatherings with Friends and Family: Once a week    Attends Religious Services: Never    Database Administrator or Organizations: No    Attends Banker Meetings: Never    Marital Status: Married  Depression (PHQ2-9): Medium Risk (04/02/2024)   Depression (PHQ2-9)    PHQ-2 Score: 9  Alcohol  Screen: Not on file  Housing: Unknown (07/22/2023)   Housing Stability Vital Sign    Unable to Pay for Housing in the Last Year: No    Number of Times Moved in the Last Year: Not on file    Homeless in the Last Year: No  Utilities: Not At Risk (07/22/2023)   AHC Utilities    Threatened with loss of utilities: No  Health Literacy: Not on file    Family History  Problem Relation Age of Onset   Colon cancer Mother    COPD Mother    Emphysema Mother    Heart disease Brother    Cancer Maternal Grandfather    Cancer Maternal Grandmother    Drug abuse Daughter    Crohn's disease Son    Diabetes Neg Hx     Past Medical History:  Diagnosis Date   Anemia    Anxiety    Aortic stenosis    a. Now has St Jude mechanical aortic valve (6/00). b. Echo (6/15) with EF 60-65%, mechanical aortic valve with mean gradient 27 mmHg.   Arthritis    a. Possible c-spine arthritis with pain down left arm.     Carotid artery disease    a. Carotid dopplers (7/16) with 40-59% BICA stenosis.     Chronic angle-closure glaucoma(365.23)    Chronic diastolic CHF (congestive heart failure) (HCC)    a.  RHC (7/15)  with mean RA 2, PA 22/11, mean PCWP 9, CI 3.79.   Contrast media allergy    Coronary atherosclerosis of native coronary artery    a. CABG at time of AVR in 6/00 with RIMA-RCA. b. Abnl nuc 11/2013 -> LHC (7/15) with patent RIMA-RCA, 80% mRCA, 80% pLAD with FFR 0.73, treated with DES to pLAD.   Depression    Essential hypertension    GERD (gastroesophageal reflux disease)    Hyperlipidemia    Low back pain    Neuromuscular disorder (HCC)    neuropathy   Peripheral vascular disease    a, H/o Right SFA stent. b. Peripheral arterial dopplers (7/16) with right SFA stent patent.     PONV (postoperative nausea and vomiting)    N&V  Type II diabetes mellitus [facility])     Past Surgical History:  Procedure Laterality Date   BILATERAL OOPHORECTOMY  05/24/1985   BREAST BIOPSY  05/24/2010   left   CARDIAC CATHETERIZATION  01/14/1999   normal LV function, severe aortic stenosis; 80% and 70% stenosis in RCA; mild 20% distal norrowing in L main with 20% proximal LAD stenosis, 40% diagonal stenosis and 20% proximal circumflex stenosis   CARDIAC VALVE REPLACEMENT  05/24/1998   aortic valve    CARDIOVASCULAR STRESS TEST  08/25/2011   R/L MV - EF 72%; no scintigraphic evidence of inducible MI; normal perfusionTID of 1.25 elevated - could indicate small vessle subendocardial ischemia; EKG NSR at 66, non diagnostic for ischemia   CARPAL TUNNEL RELEASE  05/25/1987   bilaterally   CATARACT EXTRACTION W/ INTRAOCULAR LENS  IMPLANT, BILATERAL  ~ 2007   CESAREAN SECTION  1969; 1971   CHOLECYSTECTOMY  05/24/1988   COLONOSCOPY N/A 10/27/2012   Procedure: COLONOSCOPY;  Surgeon: Belvie JONETTA Just, MD;  Location: [facility];  Service: Endoscopy;  Laterality: N/A;   CORONARY ARTERY BYPASS GRAFT  05/24/1998   RIMA to RCA   CORONARY/GRAFT ANGIOGRAPHY N/A 09/07/2021   Procedure: CORONARY/GRAFT ANGIOGRAPHY;  Surgeon: Wendel Lurena POUR, MD;  Location: [facility]AS[facility] CV [facility];  Service: Cardiovascular;  Laterality: N/A;    DOPPLER ECHOCARDIOGRAPHY  03/29/2012   EF >55%; mild concentric LVH; stage 1 diastolic dysfunction, elevated LV filling pressure, dilated LA; MAC mild MR; St Jude AVR peak and mean gradients of and ; transvalvular gradients have increased (prev 23 and 14 respectively)   ESOPHAGOGASTRODUODENOSCOPY N/A 10/27/2012   Procedure: ESOPHAGOGASTRODUODENOSCOPY (EGD);  Surgeon: Belvie JONETTA Just, MD;  Location: [facility];  Service: Endoscopy;  Laterality: N/A;   FEMORAL ARTERY STENT  09/20/2011   6 x 40 Smart Nitinol self-expanding stent placed;  10/15/2031 -R SFA stent open and patent w/o evidence of restenosis   FRACTIONAL FLOW RESERVE WIRE  12/14/2013   Procedure: FRACTIONAL FLOW RESERVE WIRE;  Surgeon: Ezra GORMAN Shuck, MD;  Location: [facility] CATH LAB;  Service: Cardiovascular;;   KNEE SURGERY Left    LACERATION REPAIR     right hand   LEFT AND RIGHT HEART CATHETERIZATION WITH CORONARY ANGIOGRAM N/A 12/14/2013   Procedure: LEFT AND RIGHT HEART CATHETERIZATION WITH CORONARY ANGIOGRAM;  Surgeon: Ezra GORMAN Shuck, MD;  Location: [facility] CATH LAB;  Service: Cardiovascular;  Laterality: N/A;   LOWER EXTREMITY ANGIOGRAM N/A 09/20/2011   Procedure: LOWER EXTREMITY ANGIOGRAM;  Surgeon: Dorn JINNY Lesches, MD;  Location: [facility] CATH LAB;  Service: Cardiovascular;  Laterality: N/A;   LYMPH NODE BIOPSY  06/25/2011   core needle on 5   needle biopsy  05/25/2007   on ankles for nerve damage   NEUROPLASTY / TRANSPOSITION ULNAR NERVE AT ELBOW     right   ORIF FEMUR FRACTURE Left 09/09/2021   Procedure: OPEN REDUCTION INTERNAL FIXATION (ORIF) DISTAL FEMUR FRACTURE;  Surgeon: Kendal Franky SQUIBB, MD;  Location: [facility] OR;  Service: Orthopedics;  Laterality: Left;   PERCUTANEOUS CORONARY STENT INTERVENTION (PCI-S)  12/14/2013   Procedure: PERCUTANEOUS CORONARY STENT INTERVENTION (PCI-S);  Surgeon: Ezra GORMAN Shuck, MD;  Location[facility] CATH LAB;  Service: Cardiovascular;;  Prox LAD 3.00x12 Promus DES    PERIPHERAL ARTERIAL STENT  GRAFT  09/20/2011   right SFA   REFRACTIVE SURGERY  ` 2004   for glaucoma   TONSILLECTOMY  05/24/1966   TRIGGER FINGER RELEASE Left 12/04/2015   Procedure: LEFT RING FINGER TRIGGER RELEASE;  Surgeon:  Franky Curia, MD;  Location: [facility];  Service: Orthopedics;  Laterality: Left;   VAGINAL HYSTERECTOMY  05/25/1983   Fibroids     Medications Ordered Prior to Encounter[1]  Allergies[2]  Vitals:   06/04/24 1248  BP: 117/62  Pulse: 76    Wt Readings from Last 3 Encounters:  06/04/24 165 lb (74.8 kg)  04/02/24 171 lb 9.6 oz (77.8 kg)  02/21/24 177 lb (80.3 kg)     Body mass index is 30.18 kg/m.    Physical exam:   General: The patient was alert and appears not in acute distress.  Mood and affect are appropriate .  The patient's interactions are described here : Cooperative,  makes eye contact, follows the instructions and answers questions.   Very hoarse voice. Very hard of hearing- aids.  The patient is groomed and appropriately dressed. Head: Normocephalic, atraumatic.   Neck is supple. Mallampati 2,  neck circumference measured 15 inches. Nasal airflow  patent ,    Dental status:  very poor, upper dentures and gaps in the lower jaw, no implants.  Cardiovascular:  Regular rate and cardiac rhythm by palpable pulse. Respiratory: no audible wheezing, no tachypnoea.   Skin:  Without evidence of ankle edema. No discoloration.  Trunk:  BMI is 30.2   The patient's posture was erect.   Neurologic exam : The patient was awake and alert, oriented to place and time.   Attention span & concentration ability appeared normal.  Speech was fluent, with dysarthria, dysphonia but not aphasia, and of normal volume.  She can whistle.     Cranial nerves:  There was no loss of smell or taste reported  Pupils are round, equal in size and briskly reactive to light.   Funduscopic exam deferred .  Extraocular movements in vertical and horizontal planes were  intact and without nystagmus. (No Diplopia reported) . Visual fields by finger perimetry are intact. Hearing was intact to soft voice.  Facial sensation intact to fine touch.   ill fitting dentures- asymmetric  facial appearance but normal motor strength:  Symmetric movement and tongue and uvula move midline.  Neck ROM : rotation, tilt and flexion extension were intact for age and shoulder shrug was symmetrical.    Motor exam:  Symmetric bulk, strength and ROM.   Normal tone without cog- wheeling, and symmetric grip strength .   Sensory:  Fine touch and vibration were affected ( see Dr Jayme note DM neuropathy)   Coordination: The patient reported no problems with button closure and no changes to penmanship.   The Finger-to-nose maneuver was intact without evidence of ataxia, dysmetria or tremor.   Gait and station: Arm swing was intact.  Uses  a cane , leans to the left. Slow .   Deep tendon reflexes: Upper extremities did show symmetric DTRs. Lower extremity DTRs were attenuated.          I would like to thank  and Verneita Cooks for allowing me to meet with and to take care of this pleasant patient.    The patient stated she had several sleep tests before , and I cannot find documentation, these would be older than 10 years.  Never was told she had apnea, and she has a pulmonary work up.    I have established the following assessment:  1)  Chronic non organic insomnia.  Sleep initiation.  2)   delayed sleep phase syndrome  3)  poor sleep hygiene, cell phone use,  TV in  bedroom, etc.  4) daughter with bipolar disease, sister alcoholic, daughter narcicisstic personality .    My Plan is to proceed with:  1) rule out of organic sleep disorder by HST or by in lab test. Her delayed sleep habits are making a in lab study less likely successful.  Establish sleep hygiene, get up in AM, set a time, .  Insomnia guidelines given.  Continue with counseling, psychiatric care and cardiac  care and general neurological care if needed.   In short, Mrs Poliquin  is presenting with chronic ( years of ) insomnia  , a symptom that can be attributed to anxiety , and stress.   I plan to follow up either personally or through our NP within 6 month.   A total time of 61 minutes consistent of a part of face to face encounter , exam and interview,  and additional time for chart review was spent today..  At today's visit, we discussed treatment options, associated risk and benefits, and engage in counseling as needed.   Additionally the following were reviewed: Past medical records, past medical and surgical history, family and social background, as well as relevant laboratory results, imaging findings, and medical notes, where applicable.   This message was generated using dictation software, and as a result, it may contain unintentional typos or errors.  Nevertheless, extensive effort was made to accurately convey at the pertinent aspects of the patient visit.   Dedra Gores, MD  [facility] Board certified by The [employer] of Sleep Medicine and Diplomate of the [employer] of Sleep Medicine. Board certified In Neurology through the ABPN, Fellow of the [employer] of Neurology.         [1]  Current Outpatient Medications on File Prior to Visit  Medication Sig Dispense Refill   Ascorbic Acid  (VITAMIN C ) 1000 MG tablet Take 1,000 mg by mouth every evening.     aspirin  EC 81 MG tablet Take 81 mg by mouth every evening.     Blood Glucose Monitoring Suppl DEVI 1 each by Does not apply route daily. May substitute to any manufacturer covered by patient's insurance. 1 each 0   Calcium  250 MG CAPS Take 250 mg by mouth.     Cholecalciferol (VITAMIN D3) 125 MCG (5000 UT) TABS Take by mouth.     cyanocobalamin  (VITAMIN B12) 1000 MCG tablet Take 1,000 mcg by mouth daily.     fluticasone  (FLONASE ) 50 MCG/ACT nasal spray Place 2 sprays into  both nostrils as needed for allergies or rhinitis.     folic acid  (FOLVITE ) 800 MCG tablet Take 800 mcg by mouth daily.     Glucose Blood (BLOOD GLUCOSE TEST STRIPS) STRP 1 each by In Vitro route daily. May substitute to any manufacturer covered by patient's insurance. 100 strip 3   Lancet Device MISC 1 each by Does not apply route daily. May substitute to any manufacturer covered by patient's insurance. 1 each 0   Lancets Misc. MISC 1 each by Does not apply route daily. May substitute to any manufacturer covered by patient's insurance. 100 each 3   latanoprost  (XALATAN ) 0.005 % ophthalmic solution PLACE 1 DROP INTO BOTH EYES AT BEDTIME. 2.5 mL 2   Magnesium  Glycinate 100 MG CAPS Take 100 mg by mouth at bedtime.     metoprolol  succinate (TOPROL  XL) 50 MG 24 hr tablet Take 1 tablet (50 mg total) by mouth daily. Take with or immediately following a meal. 90 tablet 3  Multiple Vitamin (MULTIVITAMIN WITH MINERALS) TABS tablet Take 2 tablets by mouth daily.     nitroGLYCERIN  (NITROSTAT ) 0.4 MG SL tablet Place 1 tablet (0.4 mg total) under the tongue every 5 (five) minutes as needed for chest pain. 20 tablet 12   pantoprazole  (PROTONIX ) 40 MG tablet Take 1 tablet (40 mg total) by mouth daily.     potassium chloride  SA (KLOR-CON  M20) 20 MEQ tablet Take 1 tablet (20 mEq total) by mouth daily.     Psyllium (METAMUCIL) 0.36 g CAPS Take 1 tablet by mouth daily as needed.     rosuvastatin  (CRESTOR ) 20 MG tablet Take 1 tablet (20 mg total) by mouth every evening. 90 tablet 1   sertraline  (ZOLOFT ) 100 MG tablet Take 2.5 tablets (250 mg total) by mouth daily. 225 tablet 1   torsemide  (DEMADEX ) 20 MG tablet Take 1 tablet (20 mg total) by mouth daily. increase her Torsemide  to 20mg  twice daily for 3 days THEN BACK TO ONCE DAILY     traZODone  (DESYREL ) 100 MG tablet Take 1-2 tablets (100-200 mg total) by mouth at bedtime as needed. 180 tablet 1   valsartan  (DIOVAN ) 40 MG tablet TAKE 1 TABLET BY MOUTH DAILY 90 tablet  2   warfarin (COUMADIN ) 3 MG tablet TAKE 1/2 TO 1 TABLET BY MOUTH DAILY AS DIRECTED 90 tablet 1   No current facility-administered medications on file prior to visit.  [2]  Allergies Allergen Reactions   Atorvastatin      Muscle pain   Dapagliflozin     Other reaction(s): nausea   Gabapentin      Other reaction(s): stomach issues   Simvastatin  Other (See Comments)    Muscle pain   Sulfa  Antibiotics     unknown   Iodinated Contrast Media Rash     Red rash after cardiac cath 1 wk ago, ? Contrast allergy, requires 13 hr prep now per [email or web address]

## 2024-06-05 ENCOUNTER — Telehealth: Payer: Self-pay | Admitting: Neurology

## 2024-06-05 NOTE — Telephone Encounter (Signed)
 sent mychart Split & HST Medicare/[employer] no auth req EE

## 2024-06-06 ENCOUNTER — Encounter: Payer: Self-pay | Admitting: Professional Counselor

## 2024-06-06 ENCOUNTER — Ambulatory Visit (INDEPENDENT_AMBULATORY_CARE_PROVIDER_SITE_OTHER): Admitting: Professional Counselor

## 2024-06-06 DIAGNOSIS — F331 Major depressive disorder, recurrent, moderate: Secondary | ICD-10-CM

## 2024-06-06 DIAGNOSIS — F411 Generalized anxiety disorder: Secondary | ICD-10-CM | POA: Diagnosis not present

## 2024-06-06 NOTE — Progress Notes (Signed)
"   °      [facility] Counselor/Therapist Progress Note  Patient ID: Abigail Oliver, MRN: [id number],    Date: 06/06/2024  Time Spent: 11:15 AM to 12:14 PM  Treatment Type: Individual Therapy  Reported Symptoms: fatigue, worries, trouble relaxing, sleeplessness, anxiousness, low mood, sadness, interpersonal concerns, stress, family conflict, tearfulness  Mental Status Exam:  Appearance:   Casual     Behavior:  Appropriate and Sharing  Motor:  Mobility challenges/pain, walking with cane  Speech/Language:   Clear and Coherent and Normal Rate  Affect:  Appropriate, Congruent, Depressed, and Tearful  Mood:  depressed and sad  Thought process:  normal  Thought content:    WNL  Sensory/Perceptual disturbances:    WNL  Orientation:  oriented to person, place, time/date, and situation  Attention:  Good  Concentration:  Good  Memory:  WNL  Fund of knowledge:   Good  Insight:    Good  Judgment:   Good  Impulse Control:  Good   Risk Assessment: Danger to Self:  No Self-injurious Behavior: No Danger to Others: No Duty to Warn:no Physical Aggression / Violence:No  Access to Firearms a concern: No  Gang Involvement:No   Subjective: Patient presented to session to address concerns of anxiety and depression.  She reported minimal progress at this time.  She reported her arthritis and mobility concerns to be exacerbated, and to be experiencing physical pain.  She identified challenges with doing laundry or cooking, and for her husband to be helping.  She identified experiencing distance with her son, and for him to be irritable recently, and ongoing challenges in her relationship with her sister.  She identified feeling boxed in, and while feeling that continuing to maintain boundaries with her daughter is helpful, it also brings her great sadness.  Counselor actively listened, affirmed patient feelings and experience, helped to facilitate insight, and assisted patient in her process work and  in discerning ongoing boundary-keeping in self advocacy so as to prioritize self-care.  Counselor assisted patient in developing a strategy for approach to conversation with son regarding tension in relationship, seeing and hearing less from him, his irritability, mixed messages regarding boundaries with her daughter/his sister, and her and husband's need for more support.  Interventions: Assertiveness/Communication, Solution-Oriented/Positive Psychology, Humanistic/Existential, and Insight-Oriented, Resourcing  Diagnosis:   ICD-10-CM   1. Generalized anxiety disorder  F41.1     2. Major depressive disorder, recurrent episode, moderate (HCC)  F33.1       Plan: Patient is scheduled for follow-up; continue process work and developing coping skills.  Patient short-term goal between sessions to have conversation with son regarding state of their relationship and her/husband's needs at this time, continue to maintain boundaries as needed for emotional wellbeing, prioritize medical care, self-care and downtime including rest and relaxation.  Abigail Oliver, [facility]                   "

## 2024-06-07 ENCOUNTER — Other Ambulatory Visit: Payer: Self-pay | Admitting: Cardiovascular Disease

## 2024-06-07 ENCOUNTER — Other Ambulatory Visit: Payer: Self-pay | Admitting: Student

## 2024-06-07 DIAGNOSIS — I509 Heart failure, unspecified: Secondary | ICD-10-CM

## 2024-06-08 NOTE — Telephone Encounter (Signed)
 In accordance with refill protocols, please review and address the following requirements before this medication refill can be authorized:  Labs

## 2024-06-12 ENCOUNTER — Ambulatory Visit: Admitting: Pharmacist

## 2024-06-12 DIAGNOSIS — Z7901 Long term (current) use of anticoagulants: Secondary | ICD-10-CM | POA: Diagnosis present

## 2024-06-12 DIAGNOSIS — Z952 Presence of prosthetic heart valve: Secondary | ICD-10-CM | POA: Diagnosis present

## 2024-06-12 LAB — POCT INR: INR: 3.5 — AB (ref 2.0–3.0)

## 2024-06-12 NOTE — Patient Instructions (Signed)
 Description   INR-3.5; Continue 1 tablet daily except 1/2 tablet on Sundays, Tuesdays, Thursdays Repeat INR in 3 weeks.  Coumadin  Clinic [id number]

## 2024-06-12 NOTE — Progress Notes (Cosign Needed Addendum)
 Description   INR-3.5; Continue 1 tablet daily except 1/2 tablet on Sundays, Tuesdays, Thursdays Repeat INR in 3 weeks.  Coumadin  Clinic [id number]

## 2024-06-13 ENCOUNTER — Ambulatory Visit: Admitting: Professional Counselor

## 2024-06-13 ENCOUNTER — Encounter: Payer: Self-pay | Admitting: Professional Counselor

## 2024-06-13 DIAGNOSIS — F411 Generalized anxiety disorder: Secondary | ICD-10-CM | POA: Diagnosis not present

## 2024-06-13 DIAGNOSIS — F330 Major depressive disorder, recurrent, mild: Secondary | ICD-10-CM

## 2024-06-13 NOTE — Progress Notes (Signed)
"   °      [facility] Counselor/Therapist Progress Note  Patient ID: Abigail Oliver, MRN: [id number],    Date: 06/13/2024  Time Spent: 4:17 PM to 5:20 PM  Treatment Type: Individual Therapy  Reported Symptoms: worries, stress, low mood, irritability, frustration, anxiousness, trouble relaxing, sleeplessness, restlessness, caregiver strain, health concerns (eye concern, trigger finger, pain, abdominal distress), fatigue, phase of life concerns, family conflict, grief/loss, sadness  Mental Status Exam:  Appearance:   Neat     Behavior:  Appropriate, Sharing, and Motivated  Motor:  Pt using cane per hip and leg pain  Speech/Language:   Clear and Coherent and Normal Rate  Affect:  Appropriate and Congruent  Mood:  normal  Thought process:  normal  Thought content:    WNL  Sensory/Perceptual disturbances:    WNL  Orientation:  oriented to person, place, time/date, and situation  Attention:  Good  Concentration:  Good  Memory:  WNL  Fund of knowledge:   Good  Insight:    Good  Judgment:   Good  Impulse Control:  Good   Risk Assessment: Danger to Self:  No Self-injurious Behavior: No Danger to Others: No Duty to Warn:no Physical Aggression / Violence:No  Access to Firearms a concern: No  Gang Involvement:No   Subjective: Patient presented to session to address concerns of anxiety and depression.  She reported minimal progress at this time.  Patient continued to process experience of exacerbated health concerns.  She also processed having had difficult conversation with her son as discussed prior session, and identified him having been unaware of behavior change impacting patient.  She identified them having discussed him being sick and stressed as well, and challenging circumstance with his sister/patient's daughter as overwhelming to entire family.  Patient identified continuing to maintain boundaries with daughter so as to self protect and what feels like an unsustainable dynamic.   Patient also processed experience of husband's declining health including as relates difficulty driving, and diminished self-care.  Patient identified improved boundaries with her sister.  Counselor actively listened, affirmed patient feelings and experience, helped to facilitate insight, reinforced patient positive coping skills including boundary setting and prioritization of self-care, and discussed patient and spouse wellness and home at this time given health concerns.  Patient identified not wishing to make a move in spite of overall concerns.  Counselor and patient discussed patient continued rest, relaxation, and attention to health concerns.  Interventions: Assertiveness/Communication, Solution-Oriented/Positive Psychology, Humanistic/Existential, and Insight-Oriented  Diagnosis:   ICD-10-CM   1. Generalized anxiety disorder  F41.1     2. Major depressive disorder, recurrent episode, mild  F33.0       Plan: Patient is scheduled for follow-up; continue process work and developing coping skills.  Patient short-term goal between sessions to continue to maintain interpersonal boundaries, prioritize self care especially as relates rest, relaxation, and health and medical needs.  [name], [facility]                   "

## 2024-06-17 ENCOUNTER — Other Ambulatory Visit: Payer: Self-pay | Admitting: Adult Health

## 2024-06-17 DIAGNOSIS — E785 Hyperlipidemia, unspecified: Secondary | ICD-10-CM

## 2024-06-18 NOTE — Telephone Encounter (Signed)
 High risk or very high risk warning populated when attempting to refill medication. RX request sent to PCP for review and approval if warranted.

## 2024-06-20 ENCOUNTER — Inpatient Hospital Stay (HOSPITAL_COMMUNITY): Admission: EM | Admit: 2024-06-20 | Source: Home / Self Care | Attending: Family Medicine | Admitting: Family Medicine

## 2024-06-20 ENCOUNTER — Other Ambulatory Visit: Payer: Self-pay

## 2024-06-20 ENCOUNTER — Emergency Department (HOSPITAL_COMMUNITY)

## 2024-06-20 DIAGNOSIS — I359 Nonrheumatic aortic valve disorder, unspecified: Secondary | ICD-10-CM | POA: Diagnosis present

## 2024-06-20 DIAGNOSIS — E785 Hyperlipidemia, unspecified: Secondary | ICD-10-CM | POA: Diagnosis not present

## 2024-06-20 DIAGNOSIS — N1832 Chronic kidney disease, stage 3b: Secondary | ICD-10-CM | POA: Diagnosis not present

## 2024-06-20 DIAGNOSIS — J9601 Acute respiratory failure with hypoxia: Secondary | ICD-10-CM | POA: Diagnosis not present

## 2024-06-20 DIAGNOSIS — R7989 Other specified abnormal findings of blood chemistry: Secondary | ICD-10-CM

## 2024-06-20 DIAGNOSIS — Z7901 Long term (current) use of anticoagulants: Secondary | ICD-10-CM | POA: Diagnosis not present

## 2024-06-20 DIAGNOSIS — I25119 Atherosclerotic heart disease of native coronary artery with unspecified angina pectoris: Secondary | ICD-10-CM

## 2024-06-20 DIAGNOSIS — I5021 Acute systolic (congestive) heart failure: Secondary | ICD-10-CM | POA: Diagnosis not present

## 2024-06-20 DIAGNOSIS — I214 Non-ST elevation (NSTEMI) myocardial infarction: Secondary | ICD-10-CM | POA: Diagnosis not present

## 2024-06-20 DIAGNOSIS — I739 Peripheral vascular disease, unspecified: Secondary | ICD-10-CM

## 2024-06-20 DIAGNOSIS — E781 Pure hyperglyceridemia: Secondary | ICD-10-CM

## 2024-06-20 DIAGNOSIS — I5033 Acute on chronic diastolic (congestive) heart failure: Secondary | ICD-10-CM

## 2024-06-20 DIAGNOSIS — F419 Anxiety disorder, unspecified: Secondary | ICD-10-CM | POA: Diagnosis present

## 2024-06-20 DIAGNOSIS — Z952 Presence of prosthetic heart valve: Secondary | ICD-10-CM

## 2024-06-20 DIAGNOSIS — E1142 Type 2 diabetes mellitus with diabetic polyneuropathy: Secondary | ICD-10-CM | POA: Diagnosis not present

## 2024-06-20 DIAGNOSIS — I10 Essential (primary) hypertension: Secondary | ICD-10-CM | POA: Diagnosis present

## 2024-06-20 DIAGNOSIS — R0602 Shortness of breath: Secondary | ICD-10-CM | POA: Insufficient documentation

## 2024-06-20 DIAGNOSIS — E1169 Type 2 diabetes mellitus with other specified complication: Secondary | ICD-10-CM | POA: Diagnosis present

## 2024-06-20 DIAGNOSIS — I2510 Atherosclerotic heart disease of native coronary artery without angina pectoris: Secondary | ICD-10-CM | POA: Diagnosis present

## 2024-06-20 DIAGNOSIS — D649 Anemia, unspecified: Secondary | ICD-10-CM

## 2024-06-20 LAB — TROPONIN T, HIGH SENSITIVITY
Troponin T High Sensitivity: 351 ng/L (ref 0–19)
Troponin T High Sensitivity: 799 ng/L (ref 0–19)
Troponin T High Sensitivity: 830 ng/L (ref 0–19)
Troponin T High Sensitivity: 809 ng/L (ref 0–19)

## 2024-06-20 LAB — CBC WITH DIFFERENTIAL/PLATELET
Abs Immature Granulocytes: 0.05 10*3/uL (ref 0.00–0.07)
Basophils Absolute: 0.1 10*3/uL (ref 0.0–0.1)
Basophils Relative: 1 %
Eosinophils Absolute: 0.1 10*3/uL (ref 0.0–0.5)
Eosinophils Relative: 1 %
HCT: 33.9 % — ABNORMAL LOW (ref 36.0–46.0)
Hemoglobin: 10.6 g/dL — ABNORMAL LOW (ref 12.0–15.0)
Immature Granulocytes: 1 %
Lymphocytes Relative: 12 %
Lymphs Abs: 0.9 10*3/uL (ref 0.7–4.0)
MCH: 26.4 pg (ref 26.0–34.0)
MCHC: 31.3 g/dL (ref 30.0–36.0)
MCV: 84.5 fL (ref 80.0–100.0)
Monocytes Absolute: 0.7 10*3/uL (ref 0.1–1.0)
Monocytes Relative: 9 %
Neutro Abs: 5.6 10*3/uL (ref 1.7–7.7)
Neutrophils Relative %: 76 %
Platelets: 172 10*3/uL (ref 150–400)
RBC: 4.01 MIL/uL (ref 3.87–5.11)
RDW: 16.7 % — ABNORMAL HIGH (ref 11.5–15.5)
WBC: 7.3 10*3/uL (ref 4.0–10.5)
nRBC: 0 % (ref 0.0–0.2)

## 2024-06-20 LAB — ECHOCARDIOGRAM COMPLETE
AR max vel: 1.39 cm2
AV Area VTI: 1.27 cm2
AV Area mean vel: 1.34 cm2
AV Mean grad: 11.7 mmHg
AV Peak grad: 21.2 mmHg
Ao pk vel: 2.3 m/s
Area-P 1/2: 4.04 cm2
S' Lateral: 2.8 cm
Weight: 2610.25 [oz_av]

## 2024-06-20 LAB — I-STAT CHEM 8, ED
BUN: 25 mg/dL — ABNORMAL HIGH (ref 8–23)
Calcium, Ion: 1.15 mmol/L (ref 1.15–1.40)
Chloride: 106 mmol/L (ref 98–111)
Creatinine, Ser: 1.3 mg/dL — ABNORMAL HIGH (ref 0.44–1.00)
Glucose, Bld: 148 mg/dL — ABNORMAL HIGH (ref 70–99)
HCT: 32 % — ABNORMAL LOW (ref 36.0–46.0)
Hemoglobin: 10.9 g/dL — ABNORMAL LOW (ref 12.0–15.0)
Potassium: 4 mmol/L (ref 3.5–5.1)
Sodium: 141 mmol/L (ref 135–145)
TCO2: 23 mmol/L (ref 22–32)

## 2024-06-20 LAB — RESP PANEL BY RT-PCR (RSV, FLU A&B, COVID)  RVPGX2
Influenza A by PCR: NEGATIVE
Influenza B by PCR: NEGATIVE
Resp Syncytial Virus by PCR: NEGATIVE
SARS Coronavirus 2 by RT PCR: NEGATIVE

## 2024-06-20 LAB — COMPREHENSIVE METABOLIC PANEL WITH GFR
ALT: 15 U/L (ref 0–44)
AST: 53 U/L — ABNORMAL HIGH (ref 15–41)
Albumin: 3.9 g/dL (ref 3.5–5.0)
Alkaline Phosphatase: 219 U/L — ABNORMAL HIGH (ref 38–126)
Anion gap: 11 (ref 5–15)
BUN: 25 mg/dL — ABNORMAL HIGH (ref 8–23)
CO2: 23 mmol/L (ref 22–32)
Calcium: 9.4 mg/dL (ref 8.9–10.3)
Chloride: 104 mmol/L (ref 98–111)
Creatinine, Ser: 1.26 mg/dL — ABNORMAL HIGH (ref 0.44–1.00)
GFR, Estimated: 43 mL/min — ABNORMAL LOW
Glucose, Bld: 150 mg/dL — ABNORMAL HIGH (ref 70–99)
Potassium: 4 mmol/L (ref 3.5–5.1)
Sodium: 138 mmol/L (ref 135–145)
Total Bilirubin: 1.6 mg/dL — ABNORMAL HIGH (ref 0.0–1.2)
Total Protein: 7.1 g/dL (ref 6.5–8.1)

## 2024-06-20 LAB — HEPARIN LEVEL (UNFRACTIONATED): Heparin Unfractionated: 0.13 [IU]/mL — ABNORMAL LOW (ref 0.30–0.70)

## 2024-06-20 LAB — CBG MONITORING, ED: Glucose-Capillary: 115 mg/dL — ABNORMAL HIGH (ref 70–99)

## 2024-06-20 LAB — MAGNESIUM: Magnesium: 1.8 mg/dL (ref 1.7–2.4)

## 2024-06-20 LAB — PROTIME-INR
INR: 2.3 — ABNORMAL HIGH (ref 0.8–1.2)
Prothrombin Time: 26.6 s — ABNORMAL HIGH (ref 11.4–15.2)

## 2024-06-20 LAB — PRO BRAIN NATRIURETIC PEPTIDE: Pro Brain Natriuretic Peptide: 10760 pg/mL — ABNORMAL HIGH

## 2024-06-20 MED ORDER — ROSUVASTATIN CALCIUM 20 MG PO TABS
20.0000 mg | ORAL_TABLET | Freq: Every evening | ORAL | Status: AC
  Administered 2024-06-20 – 2024-06-29 (×10): 20 mg via ORAL
  Filled 2024-06-20 (×10): qty 1

## 2024-06-20 MED ORDER — FUROSEMIDE 10 MG/ML IJ SOLN
80.0000 mg | Freq: Once | INTRAMUSCULAR | Status: AC
  Administered 2024-06-20: 80 mg via INTRAVENOUS
  Filled 2024-06-20: qty 8

## 2024-06-20 MED ORDER — HEPARIN BOLUS VIA INFUSION
4000.0000 [IU] | Freq: Once | INTRAVENOUS | Status: AC
  Administered 2024-06-20: 4000 [IU] via INTRAVENOUS
  Filled 2024-06-20: qty 4000

## 2024-06-20 MED ORDER — HEPARIN (PORCINE) 25000 UT/250ML-% IV SOLN
1300.0000 [IU]/h | INTRAVENOUS | Status: DC
  Administered 2024-06-20: 850 [IU]/h via INTRAVENOUS
  Administered 2024-06-21: 1100 [IU]/h via INTRAVENOUS
  Administered 2024-06-22 – 2024-06-26 (×6): 1300 [IU]/h via INTRAVENOUS
  Filled 2024-06-20 (×10): qty 250

## 2024-06-20 MED ORDER — FUROSEMIDE 10 MG/ML IJ SOLN
20.0000 mg | Freq: Once | INTRAMUSCULAR | Status: AC
  Administered 2024-06-20: 20 mg via INTRAVENOUS
  Filled 2024-06-20: qty 2

## 2024-06-20 MED ORDER — HEPARIN BOLUS VIA INFUSION
2000.0000 [IU] | Freq: Once | INTRAVENOUS | Status: AC
  Administered 2024-06-20: 2000 [IU] via INTRAVENOUS
  Filled 2024-06-20: qty 2000

## 2024-06-20 MED ORDER — ASPIRIN 81 MG PO TBEC
81.0000 mg | DELAYED_RELEASE_TABLET | Freq: Every day | ORAL | Status: AC
  Administered 2024-06-21 – 2024-06-29 (×7): 81 mg via ORAL
  Filled 2024-06-20 (×8): qty 1

## 2024-06-20 MED ORDER — METOPROLOL SUCCINATE ER 50 MG PO TB24
50.0000 mg | ORAL_TABLET | Freq: Every day | ORAL | Status: AC
  Administered 2024-06-21 – 2024-06-29 (×9): 50 mg via ORAL
  Filled 2024-06-20 (×9): qty 1

## 2024-06-20 MED ORDER — ASPIRIN 81 MG PO CHEW
324.0000 mg | CHEWABLE_TABLET | Freq: Once | ORAL | Status: AC
  Administered 2024-06-20: 324 mg via ORAL
  Filled 2024-06-20: qty 4

## 2024-06-20 MED ORDER — PERFLUTREN LIPID MICROSPHERE
1.0000 mL | INTRAVENOUS | Status: AC | PRN
  Administered 2024-06-20: 3 mL via INTRAVENOUS

## 2024-06-20 MED ORDER — INSULIN ASPART 100 UNIT/ML IJ SOLN
0.0000 [IU] | INTRAMUSCULAR | Status: DC
  Administered 2024-06-21: 1 [IU] via SUBCUTANEOUS
  Administered 2024-06-21: 2 [IU] via SUBCUTANEOUS
  Filled 2024-06-20: qty 2
  Filled 2024-06-20: qty 1

## 2024-06-20 NOTE — Subjective & Objective (Addendum)
 Patient presents with cough and congestion for the past 2 days chills, orthopnea Had significant chest pain noted to have elevated troponin [id number] cardiology has been consulted She was started on heparin  repeat troponin 800 CXR showed pulmonary congestion She does have hx of PAD and CAD and diastolic CHF Cardiology felt her symptoms were due to CHF exacerbation and rec diuresis

## 2024-06-20 NOTE — ED Notes (Signed)
 83% on RA, o2 applied at 2l via [place]

## 2024-06-20 NOTE — Assessment & Plan Note (Signed)
 Appreciate cardiology consult continue heparin  continue to cycle troponin

## 2024-06-20 NOTE — Consult Note (Addendum)
 "  Cardiology Consultation   Patient ID: Abigail Oliver MRN: [id number]; DOB: [birth year]  Admit date: 06/20/2024 Date of Consult: 06/20/2024  PCP:  Abigail Jereld BROCKS, NP   [facility] Providers Cardiologist:  Abigail ONEIDA Decent, MD        Patient Profile: Abigail Oliver is a 79 y.o. female with a hx of CAD s/p remote CABG x1 (RIMA to PDA) in 05/1998 with subsequent PCI to LAD in 2015, chronic HFpEF, palpitations with no significant arrhythmias on recent monitor in 06/2023, aortic stenosis s/p mechanical AVR in 05/1998 at time of CABG on Coumadin , PAD s/p right SFA stenting in 2013, carotid stenosis, hypertension, hyperlipidemia, type 2 diabetes mellitus, CKD stage IIIb, and GERD  who is being seen 06/20/2024 for the evaluation of HF at the request of Abigail Quale DO.  History of Present Illness: Ms. Crossland has followed with Dr. Court for her PAD, Dr. Decent as her primary cardiologist, and was previously seen by Dr. Rolan in 2020 for her heart failure though looks like she was lost to follow up with the AHF team.   Last ischemic evaluation was a cardiac catheterization in 08/2021 which showed patent RIMA to PDA and patent proximal LAD stent as well as 40% stenosis of distal left main and ostial LAD and 50% mid LAD disease. Continued medical therapy was recommended. Last Echo 06/2023 showed LVEF 60-65% with moderate concentric LVH, no RWMA. G1 DD. RV nl. AVR with VTI 1.15cm and mean gradient 14 mmHg. Last lower extremity arterial dopplers in 01/2024 showed patent right SFA stenting with 75-99% stenosis in the right proximal SFA, and RT ABI 0.78 and LT ABI 0.99.   Last seen Abigail Oliver 01/2024, at that time reported shortness of breath and worsening RT hip pain with associated numbness suspected to be due to diabetic neuropathy vs claudication.   Presented to the ED for shortness of breath, chest pain, dizziness, poor sleep, chills, and nausea In the ED: BP low normal: 88/63 -> 116/63, and  hypoxic now on [place]  ECG: initial read AF though artifact does limit read, repeat ECG shows sinus rhythm with prominent u wave VR 69 CXR showed volume overload  Pertinent lab work: Cr ~1.3 [around baseline] AST 53 ProBNP 10,760 Troponin 351, no repeat ordered.  In the ED she has received an ASA load, one dose of IV lasix  20 mg, and has now been placed on IV heparin . No UOP charted.  On interview, patient shared she has been noticing shortness of breath and dizziness for several months. Chest heaviness with associated shortness of breath. Denied anginal chest pain. Recently started noticing shortness of breath at rest and orthopnea/PND. Shared her weight has been stable,  she has been working on her diet for diabetes management.  Reported being unsteady, had chronic leg pain that effects her activity level though she pushes through it.  Right ear pain/ringing. Has US  of her carotid scheduled.  Reported compliance with torsemide  at home Denied recent illness  Denied increased urine output, does notice improvement in breathing with oxygen.   Past Medical History:  Diagnosis Date   Anemia    Anxiety    Aortic stenosis    a. Now has St Jude mechanical aortic valve (6/00). b. Echo (6/15) with EF 60-65%, mechanical aortic valve with mean gradient 27 mmHg.   Arthritis    a. Possible c-spine arthritis with pain down left arm.     Carotid artery disease    a. Carotid dopplers (7/16)  with 40-59% BICA stenosis.     Chronic angle-closure glaucoma(365.23)    Chronic diastolic CHF (congestive heart failure) (HCC)    a.  RHC (7/15) with mean RA 2, PA 22/11, mean PCWP 9, CI 3.79.   Contrast media allergy    Coronary atherosclerosis of native coronary artery    a. CABG at time of AVR in 6/00 with RIMA-RCA. b. Abnl nuc 11/2013 -> LHC (7/15) with patent RIMA-RCA, 80% mRCA, 80% pLAD with FFR 0.73, treated with DES to pLAD.   Depression    Essential hypertension    GERD (gastroesophageal reflux disease)     Hyperlipidemia    Low back pain    Neuromuscular disorder (HCC)    neuropathy   Peripheral vascular disease    a, H/o Right SFA stent. b. Peripheral arterial dopplers (7/16) with right SFA stent patent.     PONV (postoperative nausea and vomiting)    N&V   Type II diabetes mellitus (HCC)     Past Surgical History:  Procedure Laterality Date   BILATERAL OOPHORECTOMY  05/24/1985   BREAST BIOPSY  05/24/2010   left   CARDIAC CATHETERIZATION  01/14/1999   normal LV function, severe aortic stenosis; 80% and 70% stenosis in RCA; mild 20% distal norrowing in L main with 20% proximal LAD stenosis, 40% diagonal stenosis and 20% proximal circumflex stenosis   CARDIAC VALVE REPLACEMENT  05/24/1998   aortic valve    CARDIOVASCULAR STRESS TEST  08/25/2011   R/L MV - EF 72%; no scintigraphic evidence of inducible MI; normal perfusionTID of 1.25 elevated - could indicate small vessle subendocardial ischemia; EKG NSR at 66, non diagnostic for ischemia   CARPAL TUNNEL RELEASE  05/25/1987   bilaterally   CATARACT EXTRACTION W/ INTRAOCULAR LENS  IMPLANT, BILATERAL  ~ 2007   CESAREAN SECTION  1969; 1971   CHOLECYSTECTOMY  05/24/1988   COLONOSCOPY N/A 10/27/2012   Procedure: COLONOSCOPY;  Surgeon: Belvie JONETTA Just, MD;  Location: [facility];  Service: Endoscopy;  Laterality: N/A;   CORONARY ARTERY BYPASS GRAFT  05/24/1998   RIMA to RCA   CORONARY/GRAFT ANGIOGRAPHY N/A 09/07/2021   Procedure: CORONARY/GRAFT ANGIOGRAPHY;  Surgeon: Wendel Lurena POUR, MD;  Location: [facility];  Service: Cardiovascular;  Laterality: N/A;   DOPPLER ECHOCARDIOGRAPHY  03/29/2012   EF >55%; mild concentric LVH; stage 1 diastolic dysfunction, elevated LV filling pressure, dilated LA; MAC mild MR; St Jude AVR peak and mean gradients of and ; transvalvular gradients have increased (prev 23 and 14 respectively)   ESOPHAGOGASTRODUODENOSCOPY N/A 10/27/2012   Procedure: ESOPHAGOGASTRODUODENOSCOPY (EGD);   Surgeon: Belvie JONETTA Just, MD;  Location: [facility];  Service: Endoscopy;  Laterality: N/A;   FEMORAL ARTERY STENT  09/20/2011   6 x 40 Smart Nitinol self-expanding stent placed;  10/15/2031 -R SFA stent open and patent w/o evidence of restenosis   FRACTIONAL FLOW RESERVE WIRE  12/14/2013   Procedure: FRACTIONAL FLOW RESERVE WIRE;  Surgeon: Ezra GORMAN Shuck, MD;  Location: [facility] CATH LAB;  Service: Cardiovascular;;   KNEE SURGERY Left    LACERATION REPAIR     right hand   LEFT AND RIGHT HEART CATHETERIZATION WITH CORONARY ANGIOGRAM N/A 12/14/2013   Procedure: LEFT AND RIGHT HEART CATHETERIZATION WITH CORONARY ANGIOGRAM;  Surgeon: Ezra GORMAN Shuck, MD;  Location: [facility] CATH LAB;  Service: Cardiovascular;  Laterality: N/A;   LOWER EXTREMITY ANGIOGRAM N/A 09/20/2011   Procedure: LOWER EXTREMITY ANGIOGRAM;  Surgeon: Dorn JINNY Lesches, MD;  Location: [facility] CATH LAB;  Service:  Cardiovascular;  Laterality: N/A;   LYMPH NODE BIOPSY  06/25/2011   core needle on 5   needle biopsy  05/25/2007   on ankles for nerve damage   NEUROPLASTY / TRANSPOSITION ULNAR NERVE AT ELBOW     right   ORIF FEMUR FRACTURE Left 09/09/2021   Procedure: OPEN REDUCTION INTERNAL FIXATION (ORIF) DISTAL FEMUR FRACTURE;  Surgeon: Kendal Franky SQUIBB, MD;  Location: [facility] OR;  Service: Orthopedics;  Laterality: Left;   PERCUTANEOUS CORONARY STENT INTERVENTION (PCI-S)  12/14/2013   Procedure: PERCUTANEOUS CORONARY STENT INTERVENTION (PCI-S);  Surgeon: Ezra GORMAN Shuck, MD;  Location: [facility] CATH LAB;  Service: Cardiovascular;;  Prox LAD 3.00x12 Promus DES    PERIPHERAL ARTERIAL STENT GRAFT  09/20/2011   right SFA   REFRACTIVE SURGERY  ` 2004   for glaucoma   TONSILLECTOMY  05/24/1966   TRIGGER FINGER RELEASE Left 12/04/2015   Procedure: LEFT RING FINGER TRIGGER RELEASE;  Surgeon: Franky Curia, MD;  Location: [facility];  Service: Orthopedics;  Laterality: Left;   VAGINAL HYSTERECTOMY  05/25/1983   Fibroids       Scheduled  Meds:  Continuous Infusions:  heparin  850 Units/hr (06/20/24 1224)   PRN Meds:   Allergies:   Allergies[1]  Social History:   Social History   Socioeconomic History   Marital status: Married    Spouse name: Fairy   Number of children: 2   Years of education: 14   Highest education level: Not on file  Occupational History    Comment: hair dresser  Tobacco Use   Smoking status: Never   Smokeless tobacco: Never  Vaping Use   Vaping status: Never Used  Substance and Sexual Activity   Alcohol  use: Not Currently   Drug use: Not Currently   Sexual activity: Never  Other Topics Concern   Not on file  Social History Narrative   Pt is a high school graduate with 2 years of college. Married in 1967 she has 1 son born 42 and 1 daughter born 64 and 1 grandchild. 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      Caffeine use- 2-3 /day, soda, tea       Lives with husband one story home   Social Drivers of Health   Tobacco Use: Low Risk (06/13/2024)   Patient History    Smoking Tobacco Use: Never    Smokeless Tobacco Use: Never    Passive Exposure: Not on file  Financial Resource Strain: Low Risk (07/22/2023)   Overall Financial Resource Strain (CARDIA)    Difficulty of Paying Living Expenses: Not hard at all  Food Insecurity: No Food Insecurity (07/22/2023)   Hunger Vital Sign    Worried About Running Out of Food in the Last Year: Never true    Ran Out of Food in the Last Year: Never true  Transportation Needs: No Transportation Needs (07/22/2023)   PRAPARE - Administrator, Civil Service (Medical): No    Lack of Transportation (Non-Medical): No  Physical Activity: Inactive (07/22/2023)   Exercise Vital Sign    Days of Exercise per Week: 0 days    Minutes of Exercise per Session: 0 min  Stress: Stress Concern Present (07/22/2023)   [employer] of Occupational Health - Occupational Stress Questionnaire    Feeling of Stress : Very much   Social Connections: Socially Isolated (07/22/2023)   Social Connection and Isolation Panel    Frequency of Communication with Friends and Family:  Once a week    Frequency of Social Gatherings with Friends and Family: Once a week    Attends Religious Services: Never    Database Administrator or Organizations: No    Attends Banker Meetings: Never    Marital Status: Married  Catering Manager Violence: Not At Risk (07/22/2023)   Humiliation, Afraid, Rape, and Kick questionnaire    Fear of Current or Ex-Partner: No    Emotionally Abused: No    Physically Abused: No    Sexually Abused: No  Depression (PHQ2-9): Medium Risk (04/02/2024)   Depression (PHQ2-9)    PHQ-2 Score: 9  Alcohol  Screen: Not on file  Housing: Unknown (07/22/2023)   Housing Stability Vital Sign    Unable to Pay for Housing in the Last Year: No    Number of Times Moved in the Last Year: Not on file    Homeless in the Last Year: No  Utilities: Not At Risk (07/22/2023)   AHC Utilities    Threatened with loss of utilities: No  Health Literacy: Not on file    Family History:   Family History  Problem Relation Age of Onset   Colon cancer Mother    COPD Mother    Emphysema Mother    Heart disease Brother    Cancer Maternal Grandfather    Cancer Maternal Grandmother    Drug abuse Daughter    Crohn's disease Son    Diabetes Neg Hx      ROS:  Please see the history of present illness.   All other ROS reviewed and negative.     Physical Exam/Data: Vitals:   06/20/24 1100 06/20/24 1115 06/20/24 1200 06/20/24 1233  BP: (!) 88/63 (!) 101/51  116/63  Pulse: 70 69  69  Resp: 20 (!) 27  (!) 25  Temp:      TempSrc:      SpO2: 100% 100%  99%  Weight:   74 kg    No intake or output data in the 24 hours ending 06/20/24 1320    06/20/2024   12:00 PM 06/04/2024   12:48 PM 04/02/2024   10:17 AM  Last 3 Weights  Weight (lbs) 163 lb 2.3 oz 165 lb 171 lb 9.6 oz  Weight (kg) 74 kg 74.844 kg 77.837 kg      Body mass index is 29.84 kg/m.  General:  Hard of hearing, though pleasant older woman in no acute distress HEENT: normal Neck: no JVD Cardiac:  normal S1, S2; RRR; systolic click  Lungs:  bilateral crackles at bases Abd: soft, nontender, non-distended  Ext: no edema Skin: warm and dry  Psych:  Normal affect   EKG:  The EKG was personally reviewed and demonstrates:  See HPI Telemetry:  Telemetry was personally reviewed and demonstrates:  sinus rhythm HR 70, one episode of NSVT 3 beats  Relevant CV Studies: Echocardiogram 06/2023 IMPRESSIONS     1. Left ventricular ejection fraction, by estimation, is 60 to 65%. The  left ventricle has normal function. The left ventricle has no regional  wall motion abnormalities. There is moderate concentric left ventricular  hypertrophy. Left ventricular  diastolic parameters are consistent with Grade I diastolic dysfunction  (impaired relaxation). Elevated left ventricular end-diastolic pressure.   2. Right ventricular systolic function is normal. The right ventricular  size is normal. There is normal pulmonary artery systolic pressure.   3. The mitral valve is normal in structure. Trivial mitral valve  regurgitation. No evidence of mitral stenosis.  4. The aortic valve has been repaired/replaced. Aortic valve  regurgitation is not visualized. No aortic stenosis is present. Aortic  valve area, by VTI measures 1.15 cm. Aortic valve mean gradient measures  14.0 mmHg. Aortic valve Vmax measures 2.62 m/s.   5. The inferior vena cava is normal in size with greater than 50%  respiratory variability, suggesting right atrial pressure of 3 mmHg.   Heart monitor 07/2023 Enrollment 07/07/2023-08/10/2023 (35 days). Minimum heart rate 50 bpm (sinus bradycardia). Maximum heart rate 114 bpm (sinus tachycardia). Average heart rate 65 bpm (normal sinus rhythm). 1 episode of non-sustained ventricular tachycardia was detected (4 beats; 2 second duration).  Ventricular ectopy was rare (<1%). Supraventricular ectopy was rare (<1%).    Impression: No significant arrhythmias detected.  Rare ectopy.   LHC 08/2021  Dominance: Right   Laboratory Data: High Sensitivity Troponin:   Recent Labs  Lab 06/20/24 0909  TRNPT 351*      Chemistry Recent Labs  Lab 06/20/24 0909 06/20/24 0920  NA 138 141  K 4.0 4.0  CL 104 106  CO2 23  --   GLUCOSE 150* 148*  BUN 25* 25*  CREATININE 1.26* 1.30*  CALCIUM  9.4  --   GFRNONAA 43*  --   ANIONGAP 11  --     Recent Labs  Lab 06/20/24 0909  PROT 7.1  ALBUMIN 3.9  AST 53*  ALT 15  ALKPHOS 219*  BILITOT 1.6*   Hematology Recent Labs  Lab 06/20/24 0909 06/20/24 0920  WBC 7.3  --   RBC 4.01  --   HGB 10.6* 10.9*  HCT 33.9* 32.0*  MCV 84.5  --   MCH 26.4  --   MCHC 31.3  --   RDW 16.7*  --   PLT 172  --    BNP Recent Labs  Lab 06/20/24 0909  PROBNP 10,760.0*     Radiology/Studies:  DG Chest 2 View Result Date: 06/20/2024 CLINICAL DATA:  Short of breath EXAM: CHEST - 2 VIEW COMPARISON:  Prior chest x-ray 03/30/2023 FINDINGS: Stable cardiomegaly. Atherosclerotic calcifications are present in transverse aorta. Patient is status post median sternotomy with evidence of prior aortic valve replacement. Increased pulmonary vascular congestion with mild interstitial edema. A few Kerley B-lines are present in the periphery of the lungs. No focal airspace infiltrate or pleural effusion. Chronic bronchitic changes persist. IMPRESSION: 1. Increased pulmonary vascular congestion now with slight interstitial edema. Findings suggest early CHF. 2. Surgical changes of prior median sternotomy for aortic valve replacement and CABG. Electronically Signed   By: Wilkie Lent M.D.   On: 06/20/2024 09:56     Assessment and Plan:  Chest pain Elevated Troponin CAD s/p CABG in 2000 [ RIMA to PDA] & s/p PCI to LAD in 2015 Last LHC 2023 showed patent graft and LAD stent, moderate CAD with additional  100% stenosis to distal RCA. No intervention was pursued, medical management. Patient reported chest heaviness with shortness of breath ECG shows u waves though chronic Initial troponin 351, repeat now pending  Continue IV heparin  for now, follow troponin trend, if rising then may need to consider coronary evaluation though I suspect this is more demand ischemia from volume overload.  Continue ASA 81 Order PTA metoprolol  succinate 50 mg for tomorrow  HFpEF Reported several months of progressive shortness of breath, dizziness, and chest heaviness CXR showed volume overload ProBNP > 10,000 Received gentle IV diuresis. No UOP charted  On exam, appears volume up Echo ordered  Give IV lasix   80 mg in one hour [received 20 around 11am], assess UOP  AS s/p mechanical AVR in 2000 Echo 06/2023 showed VTI 1.15cm and mean gradient 14 mmHg  INR 2.3 Warfarin on hold with IV heparin   Hypertension BP: 123/110 PTA Valsartan  40 mg on hold, agree with hypotension earlier today BB as above  Hyperlipidemia  12/2023 LDL 60  HDL 45 Order PTA crestor  20 mg  Carotid stenosis Outpatient US  scheduled for follow up, no indication to pursue inpatient Continue to follow outpatient   PAD s/p R SFA stenting in 2013 Continue to follow with Dr. Court outpatient Continue antiplatelet and chronic anticoagulation as above Continue HTN and hyperlipidemia management as above  Per primary T2DM CKD GERD  Risk Assessment/Risk Scores:    TIMI Risk Score for Unstable Angina or Non-ST Elevation MI:   The patient's TIMI risk score is 5, which indicates a 26% risk of all cause mortality, new or recurrent myocardial infarction or need for urgent revascularization in the next 14 days.  New [place]  Heart Association (NYHA) Functional Class NYHA Class III/IV   For questions or updates, please contact [facility] Please consult [email or web address] for contact info under      Signed, Leontine LOISE Salen, PA-C   06/20/2024 1:20 PM     [1]  Allergies Allergen Reactions   Atorvastatin      Muscle pain   Dapagliflozin     Other reaction(s): nausea   Gabapentin      Other reaction(s): stomach issues   Simvastatin  Other (See Comments)    Muscle pain   Sulfa  Antibiotics     unknown   Iodinated Contrast Media Rash     Red rash after cardiac cath 1 wk ago, ? Contrast allergy, requires 13 hr prep now per [email or web address]     "

## 2024-06-20 NOTE — ED Provider Notes (Signed)
 Patient here for chest pain. She is chest pain free at this time. She was pending cardiology consult earlier after she was found to have an elevated troponin.  Physical Exam  BP 125/73   Pulse 68   Temp 99.4 F (37.4 C) (Oral)   Resp 19   Wt 74 kg Comment: estimated  SpO2 99%   BMI 29.84 kg/m     ED Course / MDM    Medical Decision Making Patient feeling better, asking for food, cardiology saw the patient and recommended to continue heparin  drip and they will see her in the morning and hospitalist admission at this time.  Think her symptoms may be from acute CHF exacerbation.  She is given Lasix  as well.  Patient's case with hospitalist team and patient be admitted for further workup and management.  Patient is agreeable to plan.  Problems Addressed: Acute systolic congestive heart failure (HCC): acute illness or injury  Amount and/or Complexity of Data Reviewed Labs: ordered. Radiology: ordered. ECG/medicine tests: ordered. Discussion of management or test interpretation with external provider(s): Dr. Mallaimpeddi -I spoke with her on the phone regarding patient's case and she recommended admission to hospital service, continued heparin  and they will continue to follow  Dr. Silvester - hospitalist - I spoke with her regarding patient's case and she will admit patietn for further workup and management   Risk OTC drugs. Prescription drug management. Drug therapy requiring intensive monitoring for toxicity. Decision regarding hospitalization.          Gennaro Duwaine CROME, DO 06/20/24 2331

## 2024-06-20 NOTE — H&P (Incomplete)
 "    Abigail Oliver FMW:[id number] DOB: May 02, 1946 DOA: 06/20/2024     PCP: [name], NP   Outpatient Specialists: * NONE CARDS:   Dr. Darryle ONEIDA Decent, MD Dr. Court  Patient arrived to ER on 06/20/24 at 0850 Referred by Attending Gennaro Duwaine CROME, DO   Patient coming from:    home Lives  With family    Chief Complaint:   Chief Complaint  Patient presents with   Shortness of Breath   Nausea   Chills    HPI: Abigail Oliver is a 79 y.o. female with medical history significant of PAD, anxiety aortic valve replacement on chronic Coumadin , CKD 3B, DM2 HTN, HLD glaucoma, osteoporosis, diastolic CHF    Presented with   generalized fatigue and chest pain Patient presents with cough and congestion for the past 2 days chills, orthopnea Had significant chest pain noted to have elevated troponin 351-700 cardiology has been consulted She was started on heparin  repeat troponin 800 CXR showed pulmonary congestion She does have hx of PAD and CAD and diastolic CHF Cardiology felt her symptoms were due to CHF exacerbation and rec diuresis  Patient states that for the past few weeks she has been progressively getting lightheaded fatigued having trouble walking and then for the past 24 hours has been having more severe chest pain feeling like something is crushing a big weight sitting on her chest her abdomen has been feeling distended and she has been having harder time breathing no significant leg swelling she was noted to be hypoxic in the emergency department down to 83% and started on 3 L FiO2 In the emergency department her symptoms have improved after getting IV Lasix    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 Court in the past for claudication in her right leg she is status post right SFA intervention in 2013 She has significant PAD with right ABI of 0.81 May view in 2025 was nonischemic  She does has history of aortic valve for which she  takes Coumadin  and reports has been taking it  Denies significant ETOH intake   Does not smoke     Regarding pertinent Chronic problems:   Hyperlipidemia - on statins Crestor  Lipid Panel     Component Value Date/Time   CHOL 132 01/02/2024 1037   CHOL 144 01/30/2021 0857   TRIG 200 (H) 01/02/2024 1037   HDL 45 (L) 01/02/2024 1037   HDL 43 01/30/2021 0857   CHOLHDL 2.9 01/02/2024 1037   VLDL 44 (H) 01/17/2019 1038   LDLCALC 60 01/02/2024 1037   LDLDIRECT 138.0 10/02/2014 0958   LABVLDL 35 01/30/2021 0857     HTN on Toprol    chronic CHF diastolic -on torsemide  last echo  Recent Results (from the past 56199 hours)  ECHOCARDIOGRAM COMPLETE   Collection Time: 06/20/24  3:20 PM  Result Value   Weight 2,610.25   BP 123/110   S' Lateral 2.80   AR max vel 1.39   AV Area VTI 1.27   AV Mean grad 11.7   AV Peak grad 21.2   Ao pk vel 2.30   Area-P 1/2 4.04   AV Area mean vel 1.34   Est EF 55 - 60%   Narrative      ECHOCARDIOGRAM REPORT       Patient Name:   Abigail Oliver Date of Exam: 06/20/2024 Medical Rec #:  [id number]         Height:  62.0 in Accession #:    [id number]        Weight:       163.1 lb Date of Birth:  [birth year]         BSA:          1.753 m Patient Age:    78 years          BP:           144/106 mmHg Patient Gender: F                 HR:           72 bpm. Exam Location:  Inpatient  Procedure: 2D Echo, Cardiac Doppler, Color Doppler and Intracardiac            Opacification Agent (Both Spectral and Color Flow Doppler were            utilized during procedure).  Indications:    CHF   History:        Patient has prior history of Echocardiogram examinations, most                 recent 07/21/2023. CHF, CAD, Prior CABG; Risk                 Factors:Hypertension, Diabetes and Dyslipidemia.                 Aortic Valve: St. Jude mechanical valve is present in the aortic                 position. Procedure Date: 10/1998.   Sonographer:    Philomena Daring Referring Phys: [id number] LOGAN N LOCKWOOD  IMPRESSIONS    1. Left ventricular ejection fraction, by estimation, is 55 to 60%. The left ventricle has normal function. The left ventricle has no regional wall motion abnormalities. There is mild asymmetric left ventricular hypertrophy of the basal-septal segment.  Left ventricular diastolic parameters are consistent with Grade II diastolic dysfunction (pseudonormalization).  2. Right ventricular systolic function is mildly reduced. The right ventricular size is normal. There is moderately elevated pulmonary artery systolic pressure. The estimated right ventricular systolic pressure is 55.2 mmHg.  3. Left atrial size was mildly dilated.  4. Right atrial size was mild to moderately dilated.  5. There is no evidence of pericardial effusion.  6. The mitral valve is degenerative. Mild to moderate mitral valve regurgitation. No evidence of mitral stenosis. Moderate mitral annular calcification.  7. Gradient across aortic vavle prosthesis unchanged from previous. The aortic valve has been repaired/replaced. Aortic valve regurgitation is not visualized. There is a St. Jude mechanical valve present in the aortic position. Procedure Date: 10/1998.  Echo findings are consistent with normal structure and function of the aortic valve prosthesis. Aortic valve area, by VTI measures 1.27 cm. Aortic valve mean gradient measures 11.7 mmHg. Aortic valve Vmax measures 2.30 m/s.  8. The inferior vena cava is dilated in size with <50% respiratory variability, suggesting right atrial pressure of 15 mmHg.  FINDINGS  Left Ventricle: Left ventricular ejection fraction, by estimation, is 55 to 60%. The left ventricle has normal function. The left ventricle has no regional wall motion abnormalities. Definity  contrast agent was given IV to delineate the left ventricular  endocardial borders. The left ventricular internal cavity size was normal in size. There is mild  asymmetric left ventricular hypertrophy of the basal-septal segment. Abnormal (paradoxical) septal motion consistent with post-operative status. Left  ventricular diastolic parameters are consistent with Grade  II diastolic dysfunction (pseudonormalization).  Right Ventricle: The right ventricular size is normal. No increase in right ventricular wall thickness. Right ventricular systolic function is mildly reduced. There is moderately elevated pulmonary artery systolic pressure. The tricuspid regurgitant  velocity is 3.17 m/s, and with an assumed right atrial pressure of 15 mmHg, the estimated right ventricular systolic pressure is 55.2 mmHg.  Left Atrium: Left atrial size was mildly dilated.  Right Atrium: Right atrial size was mild to moderately dilated.  Pericardium: There is no evidence of pericardial effusion. Presence of epicardial fat layer.  Mitral Valve: The mitral valve is degenerative in appearance. Moderate mitral annular calcification. Mild to moderate mitral valve regurgitation. No evidence of mitral valve stenosis.  Tricuspid Valve: The tricuspid valve is normal in structure. Tricuspid valve regurgitation is mild.  Aortic Valve: Gradient across aortic vavle prosthesis unchanged from previous. The aortic valve has been repaired/replaced. Aortic valve regurgitation is not visualized. Aortic valve mean gradient measures 11.7 mmHg. Aortic valve peak gradient measures  21.2 mmHg. Aortic valve area, by VTI measures 1.27 cm. There is a St. Jude mechanical valve present in the aortic position. Procedure Date: 10/1998. Echo findings are consistent with normal structure and function of the aortic valve prosthesis.  Pulmonic Valve: The pulmonic valve was normal in structure. Pulmonic valve regurgitation is not visualized.  Aorta: The aortic root and ascending aorta are structurally normal, with no evidence of dilitation.  Venous: The inferior vena cava is dilated in size with less than 50%  respiratory variability, suggesting right atrial pressure of 15 mmHg.  IAS/Shunts: No atrial level shunt detected by color flow Doppler.    LEFT VENTRICLE PLAX 2D LVIDd:         4.20 cm   Diastology LVIDs:         2.80 cm   LV e' medial:    5.22 cm/s LV PW:         0.90 cm   LV E/e' medial:  26.6 LV IVS:        1.20 cm   LV e' lateral:   10.80 cm/s LVOT diam:     1.80 cm   LV E/e' lateral: 12.9 LV SV:         63 LV SV Index:   36 LVOT Area:     2.54 cm    RIGHT VENTRICLE             IVC RV Basal diam:  3.20 cm     IVC diam: 2.20 cm RV Mid diam:    2.50 cm RV S prime:     10.30 cm/s TAPSE (M-mode): 1.5 cm  LEFT ATRIUM             Index        RIGHT ATRIUM           Index LA diam:        4.80 cm 2.74 cm/m   RA Area:     17.10 cm LA Vol (A2C):   61.4 ml 35.02 ml/m  RA Volume:   44.80 ml  25.55 ml/m LA Vol (A4C):   51.5 ml 29.38 ml/m LA Biplane Vol: 56.3 ml 32.11 ml/m  AORTIC VALVE AV Area (Vmax):    1.39 cm AV Area (Vmean):   1.34 cm AV Area (VTI):     1.27 cm AV Vmax:           230.33 cm/s AV Vmean:          159.000 cm/s  AV VTI:            0.498 m AV Peak Grad:      21.2 mmHg AV Mean Grad:      11.7 mmHg LVOT Vmax:         126.00 cm/s LVOT Vmean:        83.500 cm/s LVOT VTI:          0.249 m LVOT/AV VTI ratio: 0.50   AORTA Ao Root diam: 2.80 cm Ao Asc diam:  3.30 cm  MITRAL VALVE                TRICUSPID VALVE MV Area (PHT): 4.04 cm     TR Peak grad:   40.2 mmHg MV Decel Time: 188 msec     TR Vmax:        317.00 cm/s MV E velocity: 139.00 cm/s MV A velocity: 64.80 cm/s   SHUNTS MV E/A ratio:  2.15         Systemic VTI:  0.25 m                             Systemic Diam: 1.80 cm  Toribio Fuel MD Electronically signed by Toribio Fuel MD Signature Date/Time: 06/20/2024/8:54:10 PM       Final     *Note: Due to a large number of results and/or encounters for the requested time period, some results have not been displayed. A complete set of  results can be found in Results Review.    *** CAD  - On Aspirin , statin, betablocker, Plavix                  - *followed by cardiology                - last cardiac cath       ***DM 2 -  Lab Results  Component Value Date   HGBA1C 6.1 (H) 01/02/2024   ****on insulin , PO meds only, diet controlled  ***Hypothyroidism:   Lab Results  Component Value Date   TSH 2.99 04/27/2022   on synthroid  *** Morbid obesity-   BMI Readings from Last 1 Encounters:  06/20/24 29.84 kg/m     *** Asthma -well *** controlled on home inhalers/ nebs                     *** COPD - not **followed by pulmonology *** not  on baseline oxygen  *L,    *** OSA -on nocturnal oxygen, *CPAP, *noncompliant with CPAP  *** Hx of CVA - *with/out residual deficits on Aspirin  81 mg, 325, Plavix   ***A. Fib -   atrial fibrillation CHA2DS2 vas score **** CHA2DS2/VAS Stroke Risk Points      N/A >= 2 Points: High Risk  1 to 1.99 Points: Medium Risk  0 Points: Low Risk    Last Change: N/A      This score determines the patient's risk of having a stroke if the  patient has atrial fibrillation.      This score is not applicable to this patient. Components are not  calculated.     current  on anticoagulation with ****Coumadin   ***Xarelto,* Eliquis,  *** Not on anticoagulation secondary to Risk of Falls, *** recurrent bleeding         -  Rate control:  Currently controlled with ***Toprolol,  *Metoprolol ,* Diltiazem, *Coreg          -  Rhythm control: *** amiodarone, *flecainide  ***Hx of DVT/PE on - anticoagulation with ****Coumadin   ***Xarelto,* Eliquis,  Description   INR-3.5; Continue 1 tablet daily except 1/2 tablet on Sundays, Tuesdays, Thursdays Repeat INR in 3 weeks.  Coumadin  Clinic [phone]       ***CKD stage III*-   baseline Cr **** Estimated Creatinine Clearance: 33.6 mL/min (A) (by C-G formula based on SCr of 1.3 mg/dL (H)).  Lab Results  Component Value Date   CREATININE 1.30 (H)  06/20/2024   CREATININE 1.26 (H) 06/20/2024   CREATININE 1.26 (H) 01/02/2024   Lab Results  Component Value Date   NA 141 06/20/2024   CL 106 06/20/2024   K 4.0 06/20/2024   CO2 23 06/20/2024   BUN 25 (H) 06/20/2024   CREATININE 1.30 (H) 06/20/2024   GFRNONAA 43 (L) 06/20/2024   CALCIUM  9.4 06/20/2024   PHOS 3.4 09/06/2021   ALBUMIN 3.9 06/20/2024   GLUCOSE 148 (H) 06/20/2024    **** Liver disease MELD 3.0: 20 at 06/20/2024 10:57 AM MELD-Na: 20 at 06/20/2024 10:57 AM Calculated from: Serum Creatinine: 1.3 mg/dL at 8/71/7973  0:79 AM Serum Sodium: 141 mmol/L (Using max of 137 mmol/L) at 06/20/2024  9:20 AM Total Bilirubin: 1.6 mg/dL at 8/71/7973  0:90 AM Serum Albumin: 3.9 g/dL (Using max of 3.5 g/dL) at 8/71/7973  0:90 AM INR(ratio): 2.3 at 06/20/2024 10:57 AM Age at listing (hypothetical): 78 years Sex: Female at 06/20/2024 10:57 AM  Hepatic Function Panel     Component Value Date/Time   PROT 7.1 06/20/2024 0909   PROT 7.0 01/30/2021 0857   PROT 8.4 (H) 12/21/2012 1043   ALBUMIN 3.9 06/20/2024 0909   ALBUMIN 4.8 (H) 01/30/2021 0857   ALBUMIN 4.4 12/21/2012 1043   AST 53 (H) 06/20/2024 0909   AST 49 (H) 12/21/2012 1043   ALT 15 06/20/2024 0909   ALT 40 12/21/2012 1043   ALKPHOS 219 (H) 06/20/2024 0909   ALKPHOS 82 12/21/2012 1043   BILITOT 1.6 (H) 06/20/2024 0909   BILITOT 0.9 01/30/2021 0857   BILITOT 1.33 (H) 12/21/2012 1043   BILIDIR 0.2 03/30/2023 1805   BILIDIR 0.27 01/30/2021 0857   IBILI 0.7 03/30/2023 1805   2.3  ***BPH - on Flomax, Proscar    *** Dementia - on Aricept** Nemenda  *** Chronic anemia - baseline hg Hemoglobin & Hematocrit  Recent Labs    06/24/23 1201 06/20/24 0909 06/20/24 0920  HGB 10.2* 10.6* 10.9*   Iron/TIBC/Ferritin/ %Sat    Component Value Date/Time   IRON 65 01/10/2023 0000   TIBC 331 01/10/2023 0000   FERRITIN 69 01/10/2023 0000   IRONPCTSAT 8 (L) 09/11/2021 0656   IRONPCTSAT 18 (L) 12/21/2012 1043     Seizure DO -  las seizure *** currently on     Cancer:     While in ER:         Lab Orders         Resp panel by RT-PCR (RSV, Flu A&B, Covid) Anterior Nasal Swab         CBC with Differential         Comprehensive metabolic panel         Pro Brain natriuretic peptide         Protime-INR         Heparin  level (unfractionated)         Heparin  level (unfractionated)         Protime-INR         Magnesium   I-stat chem 8, ED (not at [facility], [facility] or [facility])      CT HEAD *** NON acute   MRI brain  ***no acute CVA  CXR - ***NON acute  CTabd/pelvis - ***nonacute  CTA chest - ***nonacute, no PE, * no evidence of infiltrate  Following Medications were ordered in ER: Medications  heparin  ADULT infusion 100 units/mL (25000 units/250mL) (850 Units/hr Intravenous New Bag/Given 06/20/24 1224)  metoprolol  succinate (TOPROL -XL) 24 hr tablet 50 mg (has no administration in time range)  rosuvastatin  (CRESTOR ) tablet 20 mg (20 mg Oral Given 06/20/24 1840)  perflutren  lipid microspheres (DEFINITY ) IV suspension (3 mLs Intravenous Given 06/20/24 1520)  aspirin  EC tablet 81 mg (has no administration in time range)  furosemide  (LASIX ) injection 20 mg (20 mg Intravenous Given 06/20/24 1054)  aspirin  chewable tablet 324 mg (324 mg Oral Given 06/20/24 1055)  heparin  bolus via infusion 4,000 Units (4,000 Units Intravenous Bolus from Bag 06/20/24 1226)  furosemide  (LASIX ) injection 80 mg (80 mg Intravenous Given 06/20/24 1610)    _______________________________________________________ ER Provider Called:       DrGOMEZ  They Recommend admit to medicine *** Will see in AM  ***SEEN in ER     ED Triage Vitals  Encounter Vitals Group     BP 06/20/24 0902 118/83     Girls Systolic BP Percentile --      Girls Diastolic BP Percentile --      Boys Systolic BP Percentile --      Boys Diastolic BP Percentile --      Pulse Rate 06/20/24 0902 75     Resp 06/20/24 0902 (!) 22     Temp 06/20/24 0902 98.2 F (36.8 C)      Temp Source 06/20/24 0902 Oral     SpO2 06/20/24 0901 (!) 83 %     Weight 06/20/24 1200 163 lb 2.3 oz (74 kg)     Height --      Head Circumference --      Peak Flow --      Pain Score 06/20/24 0916 7     Pain Loc --      Pain Education --      Exclude from Growth Chart --   UFJK(75)@     _________________________________________ Significant initial  Findings: Abnormal Labs Reviewed  CBC WITH DIFFERENTIAL/PLATELET - Abnormal; Notable for the following components:      Result Value   Hemoglobin 10.6 (*)    HCT 33.9 (*)    RDW 16.7 (*)    All other components within normal limits  COMPREHENSIVE METABOLIC PANEL WITH GFR - Abnormal; Notable for the following components:   Glucose, Bld 150 (*)    BUN 25 (*)    Creatinine, Ser 1.26 (*)    AST 53 (*)    Alkaline Phosphatase 219 (*)    Total Bilirubin 1.6 (*)    GFR, Estimated 43 (*)    All other components within normal limits  PRO BRAIN NATRIURETIC PEPTIDE - Abnormal; Notable for the following components:   Pro Brain Natriuretic Peptide 10,760.0 (*)    All other components within normal limits  PROTIME-INR - Abnormal; Notable for the following components:   Prothrombin Time 26.6 (*)    INR 2.3 (*)    All other components within normal limits  I-STAT CHEM 8, ED - Abnormal; Notable for the following components:   BUN 25 (*)    Creatinine, Ser 1.30 (*)    Glucose, Bld 148 (*)  Hemoglobin 10.9 (*)    HCT 32.0 (*)    All other components within normal limits  TROPONIN T, HIGH SENSITIVITY - Abnormal; Notable for the following components:   Troponin T High Sensitivity 351 (*)    All other components within normal limits  TROPONIN T, HIGH SENSITIVITY - Abnormal; Notable for the following components:   Troponin T High Sensitivity 799 (*)    All other components within normal limits  TROPONIN T, HIGH SENSITIVITY - Abnormal; Notable for the following components:   Troponin T High Sensitivity 830 (*)    All other components  within normal limits      _________________________ Troponin ***ordered Cardiac Panel (last 3 results) No results for input(s): CKTOTAL, CKMB, TROPONINIHS, RELINDX in the last 72 hours.   ECG: Ordered Personally reviewed and interpreted by me showing: HR : *** Rhythm: *NSR, Sinus tachycardia * A.fib. W RVR, RBBB, LBBB, Paced Ischemic changes*nonspecific changes, no evidence of ischemic changes QTC*  BNP (last 3 results) Recent Labs    06/28/23 1149  BNP 500.3*      No results for input(s): DDIMER, FERRITIN, LDH, CRP in the last 72 hours.    ____________________ This patient meets SIRS Criteria and may be septic. SIRS = Systemic Inflammatory Response Syndrome  Order a lactic acid level if needed AND/OR Initiate the sepsis protocol with the attached order set OR Click Treating Associated Infection or Illness if the patient is being treated for an infection that is a known cause of these abnormalities     The recent clinical data is shown below. Vitals:   06/20/24 1800 06/20/24 1830 06/20/24 1900 06/20/24 2000  BP: (!) 125/91   (!) 113/91  Pulse: 82 80 78 76  Resp:    (!) 27  Temp:      TempSrc:      SpO2: 95%  95% 96%  Weight:            WBC     Component Value Date/Time   WBC 7.3 06/20/2024 0909   LYMPHSABS 0.9 06/20/2024 0909   LYMPHSABS 1.9 12/21/2012 1043   MONOABS 0.7 06/20/2024 0909   MONOABS 0.5 12/21/2012 1043   EOSABS 0.1 06/20/2024 0909   EOSABS 0.1 12/21/2012 1043   BASOSABS 0.1 06/20/2024 0909   BASOSABS 0.1 12/21/2012 1043        Lactic Acid, Venous No results found for: LATICACIDVEN   Lactic Acid, Venous No results found for: LATICACIDVEN  Procalcitonin *** Ordered      UA *** no evidence of UTI  ***Pending ***not ordered   Urine analysis:    Component Value Date/Time   COLORURINE YELLOW 10/21/2012 1058   APPEARANCEUR CLOUDY (A) 10/21/2012 1058   LABSPEC 1.008 10/21/2012 1058   PHURINE 7.0 10/21/2012  1058   GLUCOSEU NEGATIVE 10/21/2012 1058   GLUCOSEU NEGATIVE 04/26/2011 1634   HGBUR NEGATIVE 10/21/2012 1058   BILIRUBINUR NEGATIVE 10/21/2012 1058   KETONESUR NEGATIVE 10/21/2012 1058   PROTEINUR NEGATIVE 10/21/2012 1058   UROBILINOGEN 0.2 10/21/2012 1058   NITRITE NEGATIVE 10/21/2012 1058   LEUKOCYTESUR TRACE (A) 10/21/2012 1058    Results for orders placed or performed during the hospital encounter of 06/20/24  Resp panel by RT-PCR (RSV, Flu A&B, Covid) Anterior Nasal Swab     Status: None   Collection Time: 06/20/24  9:08 AM   Specimen: Anterior Nasal Swab  Result Value Ref Range Status   SARS Coronavirus 2 by RT PCR NEGATIVE NEGATIVE Final   Influenza A by  PCR NEGATIVE NEGATIVE Final   Influenza B by PCR NEGATIVE NEGATIVE Final    Comment: (NOTE) The Xpert Xpress SARS-CoV-2/FLU/RSV plus assay is intended as an aid in the diagnosis of influenza from Nasopharyngeal swab specimens and should not be used as a sole basis for treatment. Nasal washings and aspirates are unacceptable for Xpert Xpress SARS-CoV-2/FLU/RSV testing.  Fact Sheet for Patients: bloggercourse.com  Fact Sheet for Healthcare Providers: seriousbroker.it  This test is not yet approved or cleared by the [place]  FDA and has been authorized for detection and/or diagnosis of SARS-CoV-2 by FDA under an Emergency Use Authorization (EUA). This EUA will remain in effect (meaning this test can be used) for the duration of the COVID-19 declaration under Section 564(b)(1) of the Act, 21 U.S.C. section 360bbb-3(b)(1), unless the authorization is terminated or revoked.     Resp Syncytial Virus by PCR NEGATIVE NEGATIVE Final    Comment: (NOTE) Fact Sheet for Patients: bloggercourse.com  Fact Sheet for Healthcare Providers: seriousbroker.it  This test is not yet approved or cleared by the [place]  FDA  and has been authorized for detection and/or diagnosis of SARS-CoV-2 by FDA under an Emergency Use Authorization (EUA). This EUA will remain in effect (meaning this test can be used) for the duration of the COVID-19 declaration under Section 564(b)(1) of the Act, 21 U.S.C. section 360bbb-3(b)(1), unless the authorization is terminated or revoked.  Performed at [facility], [address] [place], [place] [address]    *Note: Due to a large number of results and/or encounters for the requested time period, some results have not been displayed. A complete set of results can be found in Results Review.    ABX started Antibiotics Given (last 72 hours)     None        No results found for the last 90 days.    ________________________________________________________________  Arterial ***Venous  Blood Gas result:  pH *** pCO2 ***; pO2 ***;     %O2 Sat ***.  ABG    Component Value Date/Time   PHART 7.420 12/14/2013 1106   PCO2ART 40.6 12/14/2013 1106   PO2ART 104.0 (H) 12/14/2013 1106   HCO3 26.3 (H) 12/14/2013 1106   TCO2 23 06/20/2024 0920   ACIDBASEDEF 2.0 12/14/2013 1009   O2SAT 98.0 12/14/2013 1106       __________________________________________________________ Recent Labs  Lab 06/20/24 0909 06/20/24 0920 06/20/24 1600  NA 138 141  --   K 4.0 4.0  --   CO2 23  --   --   GLUCOSE 150* 148*  --   BUN 25* 25*  --   CREATININE 1.26* 1.30*  --   CALCIUM  9.4  --   --   MG  --   --  1.8    Cr  * stable,  Up from baseline see below Lab Results  Component Value Date   CREATININE 1.30 (H) 06/20/2024   CREATININE 1.26 (H) 06/20/2024   CREATININE 1.26 (H) 01/02/2024    Recent Labs  Lab 06/20/24 0909  AST 53*  ALT 15  ALKPHOS 219*  BILITOT 1.6*  PROT 7.1  ALBUMIN 3.9   Lab Results  Component Value Date   CALCIUM  9.4 06/20/2024   PHOS 3.4 09/06/2021          Plt: Lab Results  Component Value Date   PLT 172 06/20/2024         Recent  Labs  Lab 06/20/24 0909 06/20/24 0920  WBC 7.3  --  NEUTROABS 5.6  --   HGB 10.6* 10.9*  HCT 33.9* 32.0*  MCV 84.5  --   PLT 172  --     HG/HCT * stable,  Down *Up from baseline see below    Component Value Date/Time   HGB 10.9 (L) 06/20/2024 0920   HGB 11.0 (L) 01/30/2021 0857   HGB 12.7 12/21/2012 1043   HCT 32.0 (L) 06/20/2024 0920   HCT 33.7 (L) 01/30/2021 0857   HCT 37.5 12/21/2012 1043   MCV 84.5 06/20/2024 0909   MCV 83 01/30/2021 0857   MCV 85.1 12/21/2012 1043      No results for input(s): LIPASE, AMYLASE in the last 168 hours. No results for input(s): AMMONIA in the last 168 hours.    _______________________________________________ Hospitalist was called for admission for *** Acute systolic congestive heart failure (HCC) ***    The following Work up has been ordered so far:  Orders Placed This Encounter  Procedures   Critical Care   Resp panel by RT-PCR (RSV, Flu A&B, Covid) Anterior Nasal Swab   DG Chest 2 View   CBC with Differential   Comprehensive metabolic panel   Pro Brain natriuretic peptide   Protime-INR   Heparin  level (unfractionated)   Heparin  level (unfractionated)   Protime-INR   Magnesium    Diet NPO time specified Except for: Sips with Meds   Cardiac Monitoring - Continuous Indefinite   Strict intake and output   Daily weights   heparin  per pharmacy consult   monitoring by pharmacy   Inpatient consult to Cardiology   Consult to hospitalist   Consult to hospitalist   I-stat chem 8, ED (not at [facility], [facility] or [facility])   EKG 12-Lead   EKG 12-Lead   ECHOCARDIOGRAM COMPLETE     OTHER Significant initial  Findings:  labs showing:     DM  labs:  HbA1C: Recent Labs    01/02/24 1037  HGBA1C 6.1*       CBG (last 3)  No results for input(s): GLUCAP in the last 72 hours.        Cultures:    Component Value Date/Time   SDES URINE, RANDOM 10/29/2013 2121   SPECREQUEST NONE 10/29/2013 2121    CULT  10/29/2013 2015    NO GROWTH 5 DAYS Performed at [facility]   REPTSTATUS 10/30/2013 FINAL 10/29/2013 2121     Radiological Exams on Admission: DG Chest 2 View Result Date: 06/20/2024 CLINICAL DATA:  Short of breath EXAM: CHEST - 2 VIEW COMPARISON:  Prior chest x-ray 03/30/2023 FINDINGS: Stable cardiomegaly. Atherosclerotic calcifications are present in transverse aorta. Patient is status post median sternotomy with evidence of prior aortic valve replacement. Increased pulmonary vascular congestion with mild interstitial edema. A few Kerley B-lines are present in the periphery of the lungs. No focal airspace infiltrate or pleural effusion. Chronic bronchitic changes persist. IMPRESSION: 1. Increased pulmonary vascular congestion now with slight interstitial edema. Findings suggest early CHF. 2. Surgical changes of prior median sternotomy for aortic valve replacement and CABG. Electronically Signed   By: Wilkie Lent M.D.   On: 06/20/2024 09:56   _______________________________________________________________________________________________________ Latest  Blood pressure (!) 113/91, pulse 76, temperature 99.4 F (37.4 C), temperature source Oral, resp. rate (!) 27, weight 74 kg, SpO2 96%.   Vitals  labs and radiology finding personally reviewed  Review of Systems:    Pertinent positives include: ***  Constitutional:  No weight loss, night sweats, Fevers, chills, fatigue, weight loss  HEENT:  No  headaches, Difficulty swallowing,Tooth/dental problems,Sore throat,  No sneezing, itching, ear ache, nasal congestion, post nasal drip,  Cardio-vascular:  No chest pain, Orthopnea, PND, anasarca, dizziness, palpitations.no Bilateral lower extremity swelling  GI:  No heartburn, indigestion, abdominal pain, nausea, vomiting, diarrhea, change in bowel habits, loss of appetite, melena, blood in stool, hematemesis Resp:  no shortness of breath at rest. No dyspnea on exertion, No  excess mucus, no productive cough, No non-productive cough, No coughing up of blood.No change in color of mucus.No wheezing. Skin:  no rash or lesions. No jaundice GU:  no dysuria, change in color of urine, no urgency or frequency. No straining to urinate.  No flank pain.  Musculoskeletal:  No joint pain or no joint swelling. No decreased range of motion. No back pain.  Psych:  No change in mood or affect. No depression or anxiety. No memory loss.  Neuro: no localizing neurological complaints, no tingling, no weakness, no double vision, no gait abnormality, no slurred speech, no confusion  All systems reviewed and apart from HOPI all are negative _______________________________________________________________________________________________ Past Medical History:   Past Medical History:  Diagnosis Date   Anemia    Anxiety    Aortic stenosis    a. Now has St Jude mechanical aortic valve (6/00). b. Echo (6/15) with EF 60-65%, mechanical aortic valve with mean gradient 27 mmHg.   Arthritis    a. Possible c-spine arthritis with pain down left arm.     Carotid artery disease    a. Carotid dopplers (7/16) with 40-59% BICA stenosis.     Chronic angle-closure glaucoma(365.23)    Chronic diastolic CHF (congestive heart failure) (HCC)    a.  RHC (7/15) with mean RA 2, PA 22/11, mean PCWP 9, CI 3.79.   Contrast media allergy    Coronary atherosclerosis of native coronary artery    a. CABG at time of AVR in 6/00 with RIMA-RCA. b. Abnl nuc 11/2013 -> LHC (7/15) with patent RIMA-RCA, 80% mRCA, 80% pLAD with FFR 0.73, treated with DES to pLAD.   Depression    Essential hypertension    GERD (gastroesophageal reflux disease)    Hyperlipidemia    Low back pain    Neuromuscular disorder (HCC)    neuropathy   Peripheral vascular disease    a, H/o Right SFA stent. b. Peripheral arterial dopplers (7/16) with right SFA stent patent.     PONV (postoperative nausea and vomiting)     N&V   Type II diabetes mellitus (HCC)       Past Surgical History:  Procedure Laterality Date   BILATERAL OOPHORECTOMY  05/24/1985   BREAST BIOPSY  05/24/2010   left   CARDIAC CATHETERIZATION  01/14/1999   normal LV function, severe aortic stenosis; 80% and 70% stenosis in RCA; mild 20% distal norrowing in L main with 20% proximal LAD stenosis, 40% diagonal stenosis and 20% proximal circumflex stenosis   CARDIAC VALVE REPLACEMENT  05/24/1998   aortic valve    CARDIOVASCULAR STRESS TEST  08/25/2011   R/L MV - EF 72%; no scintigraphic evidence of inducible MI; normal perfusionTID of 1.25 elevated - could indicate small vessle subendocardial ischemia; EKG NSR at 66, non diagnostic for ischemia   CARPAL TUNNEL RELEASE  05/25/1987   bilaterally   CATARACT EXTRACTION W/ INTRAOCULAR LENS  IMPLANT, BILATERAL  ~ 2007   CESAREAN SECTION  1969; 1971   CHOLECYSTECTOMY  05/24/1988   COLONOSCOPY N/A 10/27/2012   Procedure: COLONOSCOPY;  Surgeon: Belvie JONETTA Just, MD;  Location:  [facility];  Service: Endoscopy;  Laterality: N/A;   CORONARY ARTERY BYPASS GRAFT  05/24/1998   RIMA to RCA   CORONARY/GRAFT ANGIOGRAPHY N/A 09/07/2021   Procedure: CORONARY/GRAFT ANGIOGRAPHY;  Surgeon: Wendel Lurena POUR, MD;  Location: [facility];  Service: Cardiovascular;  Laterality: N/A;   DOPPLER ECHOCARDIOGRAPHY  03/29/2012   EF >55%; mild concentric LVH; stage 1 diastolic dysfunction, elevated LV filling pressure, dilated LA; MAC mild MR; St Jude AVR peak and mean gradients of and ; transvalvular gradients have increased (prev 23 and 14 respectively)   ESOPHAGOGASTRODUODENOSCOPY N/A 10/27/2012   Procedure: ESOPHAGOGASTRODUODENOSCOPY (EGD);  Surgeon: Belvie JONETTA Just, MD;  Location: [facility];  Service: Endoscopy;  Laterality: N/A;   FEMORAL ARTERY STENT  09/20/2011   6 x 40 Smart Nitinol self-expanding stent placed;  10/15/2031 -R SFA stent open and patent w/o evidence of restenosis    FRACTIONAL FLOW RESERVE WIRE  12/14/2013   Procedure: FRACTIONAL FLOW RESERVE WIRE;  Surgeon: Ezra GORMAN Shuck, MD;  Location: [facility];  Service: Cardiovascular;;   KNEE SURGERY Left    LACERATION REPAIR     right hand   LEFT AND RIGHT HEART CATHETERIZATION WITH CORONARY ANGIOGRAM N/A 12/14/2013   Procedure: LEFT AND RIGHT HEART CATHETERIZATION WITH CORONARY ANGIOGRAM;  Surgeon: Ezra GORMAN Shuck, MD;  Location: [facility] CATH LAB;  Service: Cardiovascular;  Laterality: N/A;   LOWER EXTREMITY ANGIOGRAM N/A 09/20/2011   Procedure: LOWER EXTREMITY ANGIOGRAM;  Surgeon: Dorn JINNY Lesches, MD;  Location: [facility] C[facility] LAB;  Service: Cardiovascular;  Laterality: N/A;   LYMPH NODE BIOPSY  06/25/2011   core needle on 5   needle biopsy  05/25/2007   on ankles for nerve damage   NEUROPLASTY / TRANSPOSITION ULNAR NERVE AT ELBOW     right   ORIF FEMUR FRACTURE Left 09/09/2021   Procedure: OPEN REDUCTION INTERNAL FIXATION (ORIF) DISTAL FEMUR FRACTURE;  Surgeon: Kendal Franky SQUIBB, MD;  Location: [facility] OR;  Service: Orthopedics;  Laterality: Left;   PERCUTANEOUS CORONARY STENT INTERVENTION (PCI-S)  12/14/2013   Procedure: PERCUTANEOUS CORONARY STENT INTERVENTION (PCI-S);  Surgeon: Ezra GORMAN Shuck, MD;  Location: [facility] CATH LAB;  Service: Cardiovascular;;  Prox LAD 3.00x12 Promus DES    PERIPHERAL ARTERIAL STENT GRAFT  09/20/2011   right SFA   REFRACTIVE SURGERY  ` 2004   for glaucoma   TONSILLECTOMY  05/24/1966   TRIGGER FINGER RELEASE Left 12/04/2015   Procedure: LEFT RING FINGER TRIGGER RELEASE;  Surgeon: Franky Curia, MD;  Location: [facility];  Service: Orthopedics;  Laterality: Left;   VAGINAL HYSTERECTOMY  05/25/1983   Fibroids    Social History:  Ambulatory *** independently cane, walker  wheelchair bound, bed bound     reports that she has never smoked. She has never used smokeless tobacco. She reports that she does not currently use alcohol . She reports that she does not  currently use drugs.     Family History: *** Family History  Problem Relation Age of Onset   Colon cancer Mother    COPD Mother    Emphysema Mother    Heart disease Brother    Cancer Maternal Grandfather    Cancer Maternal Grandmother    Drug abuse Daughter    Crohn's disease Son    Diabetes Neg Hx    ______________________________________________________________________________________________ Allergies: Allergies[1]   Prior to Admission medications  Medication Sig Start Date End Date Taking? Authorizing Provider  warfarin (COUMADIN ) 3 MG tablet TAKE 1/2 TO 1 TABLET BY MOUTH  DAILY AS DIRECTED Patient taking differently: TAKE 1/2 TO 1 TABLET BY MOUTH DAILY AS DIRECTED 03/13/24  Yes Court Dorn PARAS, MD  ALPRAZolam  (XANAX ) 0.5 MG tablet Take 1 tablet (0.5 mg total) by mouth at bedtime as needed. 06/04/24   Dohmeier, Dedra, MD  Ascorbic Acid  (VITAMIN C ) 1000 MG tablet Take 1,000 mg by mouth every evening.    [provider]  aspirin  EC 81 MG tablet Take 81 mg by mouth every evening. 02/21/15   Rolan Ezra RAMAN, MD  Blood Glucose Monitoring Suppl DEVI 1 each by Does not apply route daily. May substitute to any manufacturer covered by patient's insurance. 01/02/24   Medina-Vargas, Monina C, NP  Calcium  250 MG CAPS Take 250 mg by mouth.    [provider]  Cholecalciferol (VITAMIN D3) 125 MCG (5000 UT) TABS Take by mouth.    [provider]  cyanocobalamin  (VITAMIN B12) 1000 MCG tablet Take 1,000 mcg by mouth daily.    [provider]  fluticasone  (FLONASE ) 50 MCG/ACT nasal spray Place 2 sprays into both nostrils as needed for allergies or rhinitis.    [provider]  folic acid  (FOLVITE ) 800 MCG tablet Take 800 mcg by mouth daily.    [provider]  Glucose Blood (BLOOD GLUCOSE TEST STRIPS) STRP 1 each by In Vitro route daily. May substitute to any manufacturer covered by patient's insurance. 01/02/24   Medina-Vargas,  Monina C, NP  Lancet Device MISC 1 each by Does not apply route daily. May substitute to any manufacturer covered by patient's insurance. 01/02/24   Medina-Vargas, Monina C, NP  Lancets Misc. MISC 1 each by Does not apply route daily. May substitute to any manufacturer covered by patient's insurance. 01/02/24   Medina-Vargas, Monina C, NP  latanoprost  (XALATAN ) 0.005 % ophthalmic solution PLACE 1 DROP INTO BOTH EYES AT BEDTIME. 07/25/14   Rollene Almarie LABOR, MD  Magnesium  Glycinate 100 MG CAPS Take 100 mg by mouth at bedtime. 03/28/24   Rhys Boyer T, PA-C  metoprolol  succinate (TOPROL -XL) 50 MG 24 hr tablet TAKE 1 TABLET BY MOUTH DAILY WITH OR IMMEDIATELY FOLLOWING A MEAL. 06/08/24   Court Dorn PARAS, MD  Multiple Vitamin (MULTIVITAMIN WITH MINERALS) TABS tablet Take 2 tablets by mouth daily.    [provider]  nitroGLYCERIN  (NITROSTAT ) 0.4 MG SL tablet Place 1 tablet (0.4 mg total) under the tongue every 5 (five) minutes as needed for chest pain. 04/01/23   Patel, Sona, MD  pantoprazole  (PROTONIX ) 40 MG tablet Take 1 tablet (40 mg total) by mouth daily. 06/24/23   Medina-Vargas, Monina C, NP  potassium chloride  SA (KLOR-CON  M) 20 MEQ tablet TAKE 1 TABLET BY MOUTH 2 TIMES A DAY. INCREASE TO 2 TABLETS BY MOUTH 2 TIMES A DAY FOR 3 DAYS WHILE ON HIGHER DOSE OF TORSEMIDE , THEN BACK DOWN TO 1 TABLET TWICE DAILY 06/11/24   Berry, Jonathan J, MD  Psyllium (METAMUCIL) 0.36 g CAPS Take 1 tablet by mouth daily as needed. 09/26/23   Medina-Vargas, Monina C, NP  rosuvastatin  (CRESTOR ) 20 MG tablet TAKE 1 TABLET BY MOUTH EVERY EVENING 06/18/24   Ngetich, Dinah C, NP  sertraline  (ZOLOFT ) 100 MG tablet Take 2.5 tablets (250 mg total) by mouth daily. 05/11/24   Rhys Boyer T, PA-C  torsemide  (DEMADEX ) 20 MG tablet Take 1 tablet (20 mg total) by mouth daily. increase her Torsemide  to 20mg  twice daily for 3 days THEN BACK TO ONCE DAILY 07/04/23   Goodrich, Callie E, PA-C  traZODone  (  DESYREL ) 100 MG tablet Take 1-2  tablets (100-200 mg total) by mouth at bedtime as needed. 05/11/24   Rhys Verneita DASEN, PA-C  valsartan  (DIOVAN ) 40 MG tablet TAKE 1 TABLET BY MOUTH DAILY 11/09/23   Medina-Vargas, Monina C, NP    ___________________________________________________________________________________________________ Physical Exam:    06/20/2024    8:00 PM 06/20/2024    7:00 PM 06/20/2024    6:30 PM  Vitals with BMI  Systolic 113    Diastolic 91    Pulse 76 78 80     1. General:  in No ***Acute distress***increased work of breathing ***complaining of severe pain****agitated * Chronically ill *well *cachectic *toxic acutely ill -appearing 2. Psychological: Alert and *** Oriented 3. Head/ENT:   Moist *** Dry Mucous Membranes                          Head Non traumatic, neck supple                          Normal *** Poor Dentition 4. SKIN: normal *** decreased Skin turgor,  Skin clean Dry and intact no rash    5. Heart: Regular rate and rhythm no*** Murmur, no Rub or gallop 6. Lungs: ***Clear to auscultation bilaterally, no wheezes or crackles   7. Abdomen: Soft, ***non-tender, Non distended *** obese ***bowel sounds present 8. Lower extremities: no clubbing, cyanosis, no ***edema 9. Neurologically Grossly intact, moving all 4 extremities equally *** strength 5 out of 5 in all 4 extremities cranial nerves II through XII intact 10. MSK: Normal range of motion    Chart has been reviewed  ______________________________________________________________________________________________  Assessment/Plan  ***  Admitted for *** Acute systolic congestive heart failure (HCC) ***    Present on Admission:  Acute on chronic diastolic heart failure (HCC)     No problem-specific Assessment & Plan notes found for this encounter.    Other plan as per orders.  DVT prophylaxis:  SCD *** Lovenox        Code Status:    Code Status: Prior FULL CODE *** DNR/DNI ***comfort care as per patient ***family  I had  personally discussed CODE STATUS with patient and family*  ACP *** none has been reviewed ***   Family Communication:   Family not at  Bedside  plan of care was discussed on the phone with *** Son, Daughter, Wife, Husband, Sister, Brother , father, mother  Diet  Diet Orders (From admission, onward)     Start     Ordered   06/21/24 0001  Diet NPO time specified Except for: Sips with Meds  Diet effective midnight       Question:  Except for  Answer:  Noralyn with Meds   06/20/24 2021            Disposition Plan:   *** likely will need placement for rehabilitation                          Back to current facility when stable                            To home once workup is complete and patient is stable  ***Following barriers for discharge:  Chest pain *** Stroke *** Syncope ***work up is complete                            Electrolytes corrected                               Anemia corrected h/H stable                             Pain controlled with PO medications                               Afebrile, white count improving able to transition to PO antibiotics                             Will need to be able to tolerate PO                            Will likely need home health, home O2, set up                           Will need consultants to evaluate patient prior to discharge                           Work of breathing improves       Consult Orders  (From admission, onward)           Start     Ordered   06/20/24 1922  Consult to hospitalist  Paged By Alfonso already paged to [facility] @ 19:14pm  Once       Provider:  (Not yet assigned)  Question Answer Comment  Place call to: [facility]   Reason for Consult Admit      06/20/24 1921   06/20/24 1903  Consult to hospitalist  Paged By Andrea@19 :14  Once       Provider:  (Not yet assigned)  Question Answer Comment  Place call to: [facility]   Reason for Consult Admit       06/20/24 1902                              ***Would benefit from PT/OT eval prior to DC  Ordered                   Swallow eval - SLP ordered                   Diabetes care coordinator                   Transition of care consulted                   Nutrition    consulted                  Wound care  consulted                   Palliative care    consulted  Behavioral health  consulted                    Consults called: ***  NONE   Admission status:  ED Disposition     ED Disposition  Admit   Condition  --   Comment  The patient appears reasonably stabilized for admission considering the current resources, flow, and capabilities available in the ED at this time, and I doubt any other [facility] requiring further screening and/or treatment in the ED prior to admission is  present.           Obs***  ***  inpatient     I Expect 2 midnight stay secondary to severity of patient's current illness need for inpatient interventions justified by the following: ***hemodynamic instability despite optimal treatment (tachycardia *hypotension * tachypnea *hypoxia, hypercapnia) *** Severe lab/radiological/exam abnormalities including:    Acute systolic congestive heart failure (HCC) ***  and extensive comorbidities including: *substance abuse  *Chronic pain *DM2  * CHF * CAD  * COPD/asthma *Morbid Obesity * CKD *dementia *liver disease *history of stroke with residual deficits *  malignancy, * sickle cell disease  History of amputation Chronic anticoagulation  That are currently affecting medical management.   I expect  patient to be hospitalized for 2 midnights requiring inpatient medical care.  Patient is at high risk for adverse outcome (such as loss of life or disability) if not treated.  Indication for inpatient stay as follows:  Severe change from baseline regarding mental status Hemodynamic instability despite maximal medical therapy,   severe pain requiring acute inpatient management,  inability to maintain oral hydration   persistent chest pain despite medical management Need for operative/procedural  intervention New or worsening hypoxia ongoing suicidal ideations   Need for IV antibiotics, IV fluids,, IV pain medications, IV anticoagulation,  IV rate controling medications, IV antihypertensives need for biPAP Frequent labs    Level of care   *** tele  For 12H 24H     medical floor       progressive     stepdown   tele indefinitely please discontinue once patient no longer qualifies COVID-19 Labs    Critical***  Patient is critically ill due to  hemodynamic instability * respiratory failure *severe sepsis* ongoing chest pain*  They are at high risk for life/limb threatening clinical deterioration requiring frequent reassessment and modifications of care.  Services provided include examination of the patient, review of relevant ancillary tests, prescription of lifesaving therapies, review of medications and prophylactic therapy.  Total critical care time excluding separately billable procedures: 60*  Minutes.    Latrese Carolan 06/20/2024, 8:23 PM ***  [facility]     after 2 AM please page floor coverage   If 7AM-7PM, please contact the day team taking care of the patient using [email or web address]          [1] Allergies Allergen Reactions   Atorvastatin      Muscle pain   Dapagliflozin     Other reaction(s): nausea   Gabapentin      Other reaction(s): stomach issues   Simvastatin  Other (See Comments)    Muscle pain   Sulfa  Antibiotics     unknown   Iodinated Contrast Media Rash     Red rash after cardiac cath 1 wk ago, ? Contrast allergy, requires 13 hr prep now per [email or web address]    "

## 2024-06-20 NOTE — ED Provider Notes (Signed)
 " [facility] Provider Note   CSN: [id number] Arrival date & time: 06/20/24  9149     Patient presents with: Shortness of Breath, Nausea, and Chills   Abigail Oliver is a 79 y.o. female.   [name] with a chief complaints of cough congestion and fatigue going on since yesterday.  No known sick contacts.  Feeling hot and cold but no measured temperature at home.  Feels worse when she lies back flat.  Tells me that she has recently seen her cardiologist and has a blockage in her right neck and right leg.          Prior to Admission medications  Medication Sig Start Date End Date Taking? Authorizing Provider  warfarin (COUMADIN ) 3 MG tablet TAKE 1/2 TO 1 TABLET BY MOUTH DAILY AS DIRECTED Patient taking differently: TAKE 1/2 TO 1 TABLET BY MOUTH DAILY AS DIRECTED 03/13/24  Yes Court Dorn JINNY, MD  ALPRAZolam  (XANAX ) 0.5 MG tablet Take 1 tablet (0.5 mg total) by mouth at bedtime as needed. 06/04/24   Dohmeier, Dedra, MD  Ascorbic Acid  (VITAMIN C ) 1000 MG tablet Take 1,000 mg by mouth every evening.    [provider]  aspirin  EC 81 MG tablet Take 81 mg by mouth every evening. 02/21/15   Rolan Ezra RAMAN, MD  Blood Glucose Monitoring Suppl DEVI 1 each by Does not apply route daily. May substitute to any manufacturer covered by patient's insurance. 01/02/24   Medina-Vargas, Monina C, NP  Calcium  250 MG CAPS Take 250 mg by mouth.    [provider]  Cholecalciferol (VITAMIN D3) 125 MCG (5000 UT) TABS Take by mouth.    [provider]  cyanocobalamin  (VITAMIN B12) 1000 MCG tablet Take 1,000 mcg by mouth daily.    [provider]  fluticasone  (FLONASE ) 50 MCG/ACT nasal spray Place 2 sprays into both nostrils as needed for allergies or rhinitis.    [provider]  folic acid  (FOLVITE ) 800 MCG tablet Take 800 mcg by mouth daily.    [provider]  Glucose Blood (BLOOD GLUCOSE TEST STRIPS) STRP  1 each by In Vitro route daily. May substitute to any manufacturer covered by patient's insurance. 01/02/24   Medina-Vargas, Monina C, NP  Lancet Device MISC 1 each by Does not apply route daily. May substitute to any manufacturer covered by patient's insurance. 01/02/24   Medina-Vargas, Monina C, NP  Lancets Misc. MISC 1 each by Does not apply route daily. May substitute to any manufacturer covered by patient's insurance. 01/02/24   Medina-Vargas, Monina C, NP  latanoprost  (XALATAN ) 0.005 % ophthalmic solution PLACE 1 DROP INTO BOTH EYES AT BEDTIME. 07/25/14   Rollene Almarie LABOR, MD  Magnesium  Glycinate 100 MG CAPS Take 100 mg by mouth at bedtime. 03/28/24   Rhys Boyer T, PA-C  metoprolol  succinate (TOPROL -XL) 50 MG 24 hr tablet TAKE 1 TABLET BY MOUTH DAILY WITH OR IMMEDIATELY FOLLOWING A MEAL. 06/08/24   Court Dorn JINNY, MD  Multiple Vitamin (MULTIVITAMIN WITH MINERALS) TABS tablet Take 2 tablets by mouth daily.    [provider]  nitroGLYCERIN  (NITROSTAT ) 0.4 MG SL tablet Place 1 tablet (0.4 mg total) under the tongue every 5 (five) minutes as needed for chest pain. 04/01/23   Patel, Sona, MD  pantoprazole  (PROTONIX ) 40 MG tablet Take 1 tablet (40 mg total) by mouth daily. 06/24/23   Medina-Vargas, Monina C, NP  potassium chloride  SA (KLOR-CON  M) 20 MEQ tablet TAKE  1 TABLET BY MOUTH 2 TIMES A DAY. INCREASE TO 2 TABLETS BY MOUTH 2 TIMES A DAY FOR 3 DAYS WHILE ON HIGHER DOSE OF TORSEMIDE , THEN BACK DOWN TO 1 TABLET TWICE DAILY 06/11/24   Berry, Jonathan J, MD  Psyllium (METAMUCIL) 0.36 g CAPS Take 1 tablet by mouth daily as needed. 09/26/23   Medina-Vargas, Monina C, NP  rosuvastatin  (CRESTOR ) 20 MG tablet TAKE 1 TABLET BY MOUTH EVERY EVENING 06/18/24   Ngetich, Dinah C, NP  sertraline  (ZOLOFT ) 100 MG tablet Take 2.5 tablets (250 mg total) by mouth daily. 05/11/24   Rhys Boyer T, PA-C  torsemide  (DEMADEX ) 20 MG tablet Take 1 tablet (20 mg total) by mouth daily. increase her Torsemide  to 20mg   twice daily for 3 days THEN BACK TO ONCE DAILY 07/04/23   Goodrich, Callie E, PA-C  traZODone  (DESYREL ) 100 MG tablet Take 1-2 tablets (100-200 mg total) by mouth at bedtime as needed. 05/11/24   Rhys Boyer T, PA-C  valsartan  (DIOVAN ) 40 MG tablet TAKE 1 TABLET BY MOUTH DAILY 11/09/23   Medina-Vargas, Monina C, NP    Allergies: Atorvastatin , Dapagliflozin, Gabapentin , Simvastatin , Sulfa  antibiotics, and Iodinated contrast media    Review of Systems  Updated Vital Signs BP (!) 166/93   Pulse 71   Temp 98.7 F (37.1 C) (Oral)   Resp (!) 24   Wt 74 kg Comment: estimated  SpO2 97%   BMI 29.84 kg/m   Physical Exam Vitals and nursing note reviewed.  Constitutional:      General: She is not in acute distress.    Appearance: She is well-developed. She is not diaphoretic.  HENT:     Head: Normocephalic and atraumatic.  Eyes:     Pupils: Pupils are equal, round, and reactive to light.  Cardiovascular:     Rate and Rhythm: Normal rate and regular rhythm.     Heart sounds: No murmur heard.    No friction rub. No gallop.  Pulmonary:     Effort: Pulmonary effort is normal.     Breath sounds: Rales present. No wheezing.     Comments: Rales in bilateral bases. Abdominal:     General: There is no distension.     Palpations: Abdomen is soft.     Tenderness: There is no abdominal tenderness.  Musculoskeletal:        General: No tenderness.     Cervical back: Normal range of motion and neck supple.  Skin:    General: Skin is warm and dry.  Neurological:     Mental Status: She is alert and oriented to person, place, and time.  Psychiatric:        Behavior: Behavior normal.     (all labs ordered are listed, but only abnormal results are displayed) Labs Reviewed  CBC WITH DIFFERENTIAL/PLATELET - Abnormal; Notable for the following components:      Result Value   Hemoglobin 10.6 (*)    HCT 33.9 (*)    RDW 16.7 (*)    All other components within normal limits  COMPREHENSIVE  METABOLIC PANEL WITH GFR - Abnormal; Notable for the following components:   Glucose, Bld 150 (*)    BUN 25 (*)    Creatinine, Ser 1.26 (*)    AST 53 (*)    Alkaline Phosphatase 219 (*)    Total Bilirubin 1.6 (*)    GFR, Estimated 43 (*)    All other components within normal limits  PRO BRAIN NATRIURETIC PEPTIDE - Abnormal; Notable for the  following components:   Pro Brain Natriuretic Peptide 10,760.0 (*)    All other components within normal limits  PROTIME-INR - Abnormal; Notable for the following components:   Prothrombin Time 26.6 (*)    INR 2.3 (*)    All other components within normal limits  I-STAT CHEM 8, ED - Abnormal; Notable for the following components:   BUN 25 (*)    Creatinine, Ser 1.30 (*)    Glucose, Bld 148 (*)    Hemoglobin 10.9 (*)    HCT 32.0 (*)    All other components within normal limits  TROPONIN T, HIGH SENSITIVITY - Abnormal; Notable for the following components:   Troponin T High Sensitivity 351 (*)    All other components within normal limits  RESP PANEL BY RT-PCR (RSV, FLU A&B, COVID)  RVPGX2  HEPARIN  LEVEL (UNFRACTIONATED)  MAGNESIUM   TROPONIN T, HIGH SENSITIVITY  TROPONIN T, HIGH SENSITIVITY    EKG: EKG Interpretation Date/Time:  Wednesday June 20 2024 09:10:03 EST Ventricular Rate:  73 PR Interval:    QRS Duration:  70 QT Interval:  402 QTC Calculation: 442 R Axis:   -12  Text Interpretation: Atrial fibrillation Septal infarct , age undetermined Marked ST abnormality, possible lateral subendocardial injury Abnormal ECG Baseline wander TECHNICALLY DIFFICULT Otherwise no significant change Confirmed by Emil Share [phone]) on 06/20/2024 10:20:45 AM  Radiology: [facility] Chest 2 View Result Date: 06/20/2024 CLINICAL DATA:  Short of breath EXAM: CHEST - 2 VIEW COMPARISON:  Prior chest x-ray 03/30/2023 FINDINGS: Stable cardiomegaly. Atherosclerotic calcifications are present in transverse aorta. Patient is status post median sternotomy with evidence  of prior aortic valve replacement. Increased pulmonary vascular congestion with mild interstitial edema. A few Kerley B-lines are present in the periphery of the lungs. No focal airspace infiltrate or pleural effusion. Chronic bronchitic changes persist. IMPRESSION: 1. Increased pulmonary vascular congestion now with slight interstitial edema. Findings suggest early CHF. 2. Surgical changes of prior median sternotomy for aortic valve replacement and CABG. Electronically Signed   By: Wilkie Lent M.D.   On: 06/20/2024 09:56     .Critical Care  Performed by: Emil Share, DO Authorized by: Emil Share, DO   Critical care provider statement:    Critical care time (minutes):  35   Critical care time was exclusive of:  Separately billable procedures and treating other patients   Critical care was time spent personally by me on the following activities:  Development of treatment plan with patient or surrogate, discussions with consultants, evaluation of patient's response to treatment, examination of patient, ordering and review of laboratory studies, ordering and review of radiographic studies, ordering and performing treatments and interventions, pulse oximetry, re-evaluation of patient's condition and review of old charts   Care discussed with: admitting provider      Medications Ordered in the ED  heparin  ADULT infusion 100 units/mL (25000 units/250mL) (850 Units/hr Intravenous New Bag/Given 06/20/24 1224)  metoprolol  succinate (TOPROL -XL) 24 hr tablet 50 mg (has no administration in time range)  rosuvastatin  (CRESTOR ) tablet 20 mg (has no administration in time range)  perflutren  lipid microspheres (DEFINITY ) IV suspension (3 mLs Intravenous Given 06/20/24 1520)  furosemide  (LASIX ) injection 20 mg (20 mg Intravenous Given 06/20/24 1054)  aspirin  chewable tablet 324 mg (324 mg Oral Given 06/20/24 1055)  heparin  bolus via infusion 4,000 Units (4,000 Units Intravenous Bolus from Bag 06/20/24 1226)   furosemide  (LASIX ) injection 80 mg (80 mg Intravenous Given 06/20/24 1610)  Medical Decision Making Amount and/or Complexity of Data Reviewed Labs: ordered. Radiology: ordered. ECG/medicine tests: ordered.  Risk OTC drugs. Prescription drug management.   [name] with a chief complaint of difficulty breathing.  Going on since yesterday.  Having some orthopnea and PND.  Hypoxic on arrival here.  On oxygen at home.  Will obtain a chest x-ray blood work.  Chest x-ray on my independent interpretation with increased edema.  Troponin elevated.  BNP elevated.  I discussed case with cardiology who will see at bedside.  The patients results and plan were reviewed and discussed.   Any x-rays performed were independently reviewed by myself.   Differential diagnosis were considered with the presenting HPI.  Medications  heparin  ADULT infusion 100 units/mL (25000 units/250mL) (850 Units/hr Intravenous New Bag/Given 06/20/24 1224)  metoprolol  succinate (TOPROL -XL) 24 hr tablet 50 mg (has no administration in time range)  rosuvastatin  (CRESTOR ) tablet 20 mg (has no administration in time range)  perflutren  lipid microspheres (DEFINITY ) IV suspension (3 mLs Intravenous Given 06/20/24 1520)  furosemide  (LASIX ) injection 20 mg (20 mg Intravenous Given 06/20/24 1054)  aspirin  chewable tablet 324 mg (324 mg Oral Given 06/20/24 1055)  heparin  bolus via infusion 4,000 Units (4,000 Units Intravenous Bolus from Bag 06/20/24 1226)  furosemide  (LASIX ) injection 80 mg (80 mg Intravenous Given 06/20/24 1610)    Vitals:   06/20/24 1337 06/20/24 1345 06/20/24 1515 06/20/24 1530  BP:  (!) 123/110 (!) 171/154 (!) 166/93  Pulse:  68 70 71  Resp:  15 (!) 28 (!) 24  Temp: 98.7 F (37.1 C)     TempSrc: Oral     SpO2:  100% 99% 97%  Weight:        Final diagnoses:  Acute systolic congestive heart failure (HCC)    Admission/ observation were discussed with the admitting  physician, patient and/or family and they are comfortable with the plan.       Final diagnoses:  Acute systolic congestive heart failure [facility])    ED Discharge Orders     None          Emil Share, DO 06/20/24 1627  "

## 2024-06-20 NOTE — Progress Notes (Signed)
 PHARMACY - ANTICOAGULATION CONSULT NOTE  Pharmacy Consult for IV heparin  Indication: chest pain/ACS  Allergies[1]  Patient Measurements: Weight: 74 kg (163 lb 2.3 oz) (estimated)  Vital Signs: Temp: 99.4 F (37.4 C) (01/28 1730) Temp Source: Oral (01/28 1730) BP: 113/91 (01/28 2000) Pulse Rate: 76 (01/28 2000)  Labs: Recent Labs    06/20/24 0909 06/20/24 0920 06/20/24 1057 06/20/24 2034  HGB 10.6* 10.9*  --   --   HCT 33.9* 32.0*  --   --   PLT 172  --   --   --   LABPROT  --   --  26.6*  --   INR  --   --  2.3*  --   HEPARINUNFRC  --   --   --  0.13*  CREATININE 1.26* 1.30*  --   --     Estimated Creatinine Clearance: 33.6 mL/min (A) (by C-G formula based on SCr of 1.3 mg/dL (H)).   Medical History: Past Medical History:  Diagnosis Date   Anemia    Anxiety    Aortic stenosis    a. Now has [employer] mechanical aortic valve (6/00). b. Echo (6/15) with EF 60-65%, mechanical aortic valve with mean gradient 27 mmHg.   Arthritis    a. Possible c-spine arthritis with pain down left arm.     Carotid artery disease    a. Carotid dopplers (7/16) with 40-59% BICA stenosis.     Chronic angle-closure glaucoma(365.23)    Chronic diastolic CHF (congestive heart failure) (HCC)    a.  RHC (7/15) with mean RA 2, PA 22/11, mean PCWP 9, CI 3.79.   Contrast media allergy    Coronary atherosclerosis of native coronary artery    a. CABG at time of AVR in 6/00 with RIMA-RCA. b. Abnl nuc 11/2013 -> LHC (7/15) with patent RIMA-RCA, 80% mRCA, 80% pLAD with FFR 0.73, treated with DES to pLAD.   Depression    Essential hypertension    GERD (gastroesophageal reflux disease)    Hyperlipidemia    Low back pain    Neuromuscular disorder (HCC)    neuropathy   Peripheral vascular disease    a, H/o Right SFA stent. b. Peripheral arterial dopplers (7/16) with right SFA stent patent.     PONV (postoperative nausea and vomiting)    N&V   Type II diabetes mellitus (HCC)     Assessment: Abigail Oliver is a 79 y.o. year old female presented on 06/20/2024 with concern for CP and ACS. On warfarin prior to admission for AVR. Prior to admission regimen, warfarin 1.5mg  STTh and 3mg  all other days (Last dose prior to admission 1/27 afternoon). Pharmacy consulted for warfarin inpatient management.   Date INR Warfarin Dose  1/28 2.3, slightly subtherapeutic     PM: heparin  level 0.13 is subtherapeutic on 850 units/hr. No issues with the infusion or bleeding reported per RN.  Goal of Therapy:  INR goal: 2.5-3.5 Heparin  level 0.3-0.7 units/ml Monitor platelets by anticoagulation protocol: Yes   Plan:  Heparin  2000 units IV bolus x 1  Increase heparin  infusion to 1000 units/hr 8h heparin  level  Daily heparin  level, CBC, and monitoring for bleeding F/u plans for anticoagulation and cards recs  Thank you for allowing pharmacy to participate in this patient's care.  Rocky Slade, PharmD, BCPS 06/20/2024,10:24 PM  Please check AMION for all [facility] Pharm[facility] phone numbers After 10:00 PM, call Main Pharmacy [phone]         [1]  Allergies Allergen Reactions  Atorvastatin      Muscle pain   Gabapentin  Other (See Comments)    Stomach issues   Simvastatin  Other (See Comments)    Muscle pain   Sulfa  Antibiotics     unknown   Farxiga [Dapagliflozin] Nausea Only and Rash   Iodinated Contrast Media Rash     Red rash after cardiac cath 1 wk ago, ? Contrast allergy, requires 13 hr prep now per [email or web address]

## 2024-06-20 NOTE — ED Triage Notes (Signed)
 Pt. Stated, I was SOB yesterday with chills and hot with some nausea, My chest feels very heavy and very dizzy cause Im not a good sleeper.

## 2024-06-20 NOTE — H&P (Signed)
 "    Abigail Oliver FMW:[id number] DOB: Feb 23, 1946 DOA: 06/20/2024     PCP: [name], NP   Outpatient Specialists: * NONE CARDS: * Dr. Darryle ONEIDA Decent, MD  NEphrology: *  Dr. No care team member to display  NEurology *   Dr. Pulmonary *  Dr.  Oncology * Dr.No care team member to display  GI* Dr.  Gwen, LB) No care team member to display Urology Dr. *  Patient arrived to ER on 06/20/24 at 0850 Referred by Attending Gennaro Duwaine CROME, DO   Patient coming from:    home Lives alone,   *** With family From facility *** SNF, [place], [place]    Chief Complaint: *** Chief Complaint  Patient presents with   Shortness of Breath   Nausea   Chills    HPI: Abigail Oliver is a 79 y.o. female with medical history significant of ***     Presented with   * Patient presents with cough and congestion for the past 2 days chills, orthopnea Had significant chest pain noted to have elevated troponin 351-700 cardiology has been consulted She was started on heparin  repeat troponin 800 CXR showed pulmonary congestion She does have hx of PAD and CAD and diastolic CHF Cardiology felt her symptoms were due to CHF exacerbation and rec diuresis       Denies significant ETOH intake *** Does not smoke*** but interested in quitting***  Denies marijuana use ***    Regarding pertinent Chronic problems: ***  ****Hyperlipidemia - *on statins {statin:315258}  Lipid Panel     Component Value Date/Time   CHOL 132 01/02/2024 1037   CHOL 144 01/30/2021 0857   TRIG 200 (H) 01/02/2024 1037   HDL 45 (L) 01/02/2024 1037   HDL 43 01/30/2021 0857   CHOLHDL 2.9 01/02/2024 1037   VLDL 44 (H) 01/17/2019 1038   LDLCALC 60 01/02/2024 1037   LDLDIRECT 138.0 10/02/2014 0958   LABVLDL 35 01/30/2021 0857    ***HTN on   ***chronic CHF diastolic/systolic/ combined - last echo*** Recent Results (from the past 56199 hours)  ECHOCARDIOGRAM COMPLETE   Collection Time: 06/20/24  3:20 PM   Result Value   Weight 2,610.25   BP 123/110   *Note: Due to a large number of results and/or encounters for the requested time period, some results have not been displayed. A complete set of results can be found in Results Review.    *** CAD  - On Aspirin , statin, betablocker, Plavix                  - *followed by cardiology                - last cardiac cath       ***DM 2 -  Lab Results  Component Value Date   HGBA1C 6.1 (H) 01/02/2024   ****on insulin , PO meds only, diet controlled  ***Hypothyroidism:   Lab Results  Component Value Date   TSH 2.99 04/27/2022   on synthroid  *** Morbid obesity-   BMI Readings from Last 1 Encounters:  06/20/24 29.84 kg/m     *** Asthma -well *** controlled on home inhalers/ nebs                     *** COPD - not **followed by pulmonology *** not  on baseline oxygen  *L,    *** OSA -on nocturnal oxygen, *CPAP, *noncompliant with CPAP  *** Hx of CVA - *  with/out residual deficits on Aspirin  81 mg, 325, Plavix   ***A. Fib -   atrial fibrillation CHA2DS2 vas score **** CHA2DS2/VAS Stroke Risk Points      N/A >= 2 Points: High Risk  1 to 1.99 Points: Medium Risk  0 Points: Low Risk    Last Change: N/A      This score determines the patient's risk of having a stroke if the  patient has atrial fibrillation.      This score is not applicable to this patient. Components are not  calculated.     current  on anticoagulation with ****Coumadin   ***Xarelto,* Eliquis,  *** Not on anticoagulation secondary to Risk of Falls, *** recurrent bleeding         -  Rate control:  Currently controlled with ***Toprolol,  *Metoprolol ,* Diltiazem, *Coreg          - Rhythm control: *** amiodarone, *flecainide  ***Hx of DVT/PE on - anticoagulation with ****Coumadin   ***Xarelto,* Eliquis,  Description   INR-3.5; Continue 1 tablet daily except 1/2 tablet on Sundays, Tuesdays, Thursdays Repeat INR in 3 weeks.  Coumadin  Clinic [phone]        ***CKD stage III*-   baseline Cr **** Estimated Creatinine Clearance: 33.6 mL/min (A) (by C-G formula based on SCr of 1.3 mg/dL (H)).  Lab Results  Component Value Date   CREATININE 1.30 (H) 06/20/2024   CREATININE 1.26 (H) 06/20/2024   CREATININE 1.26 (H) 01/02/2024   Lab Results  Component Value Date   NA 141 06/20/2024   CL 106 06/20/2024   K 4.0 06/20/2024   CO2 23 06/20/2024   BUN 25 (H) 06/20/2024   CREATININE 1.30 (H) 06/20/2024   GFRNONAA 43 (L) 06/20/2024   CALCIUM  9.4 06/20/2024   PHOS 3.4 09/06/2021   ALBUMIN 3.9 06/20/2024   GLUCOSE 148 (H) 06/20/2024    **** Liver disease MELD 3.0: 20 at 06/20/2024 10:57 AM MELD-Na: 20 at 06/20/2024 10:57 AM Calculated from: Serum Creatinine: 1.3 mg/dL at 8/71/7973  0:79 AM Serum Sodium: 141 mmol/L (Using max of 137 mmol/L) at 06/20/2024  9:20 AM Total Bilirubin: 1.6 mg/dL at 8/71/7973  0:90 AM Serum Albumin: 3.9 g/dL (Using max of 3.5 g/dL) at 8/71/7973  0:90 AM INR(ratio): 2.3 at 06/20/2024 10:57 AM Age at listing (hypothetical): 78 years Sex: Female at 06/20/2024 10:57 AM  Hepatic Function Panel     Component Value Date/Time   PROT 7.1 06/20/2024 0909   PROT 7.0 01/30/2021 0857   PROT 8.4 (H) 12/21/2012 1043   ALBUMIN 3.9 06/20/2024 0909   ALBUMIN 4.8 (H) 01/30/2021 0857   ALBUMIN 4.4 12/21/2012 1043   AST 53 (H) 06/20/2024 0909   AST 49 (H) 12/21/2012 1043   ALT 15 06/20/2024 0909   ALT 40 12/21/2012 1043   ALKPHOS 219 (H) 06/20/2024 0909   ALKPHOS 82 12/21/2012 1043   BILITOT 1.6 (H) 06/20/2024 0909   BILITOT 0.9 01/30/2021 0857   BILITOT 1.33 (H) 12/21/2012 1043   BILIDIR 0.2 03/30/2023 1805   BILIDIR 0.27 01/30/2021 0857   IBILI 0.7 03/30/2023 1805   2.3  ***BPH - on Flomax, Proscar    *** Dementia - on Aricept** Nemenda  *** Chronic anemia - baseline hg Hemoglobin & Hematocrit  Recent Labs    06/24/23 1201 06/20/24 0909 06/20/24 0920  HGB 10.2* 10.6* 10.9*   Iron/TIBC/Ferritin/ %Sat     Component Value Date/Time   IRON 65 01/10/2023 0000   TIBC 331 01/10/2023 0000   FERRITIN 69 01/10/2023  0000   IRONPCTSAT 8 (L) 09/11/2021 0656   IRONPCTSAT 18 (L) 12/21/2012 1043     Seizure DO - las seizure *** currently on     Cancer:     While in ER:         Lab Orders         Resp panel by RT-PCR (RSV, Flu A&B, Covid) Anterior Nasal Swab         CBC with Differential         Comprehensive metabolic panel         Pro Brain natriuretic peptide         Protime-INR         Heparin  level (unfractionated)         Heparin  level (unfractionated)         Protime-INR         Magnesium          I-stat chem 8, ED (not at [facility], [facility] or [facility])      CT HEAD *** NON acute   MRI brain  ***no acute CVA  CXR - ***NON acute  CTabd/pelvis - ***nonacute  CTA chest - ***nonacute, no PE, * no evidence of infiltrate  Following Medications were ordered in ER: Medications  heparin  ADULT infusion 100 units/mL (25000 units/272mL) (850 Units/hr Intravenous New Bag/Given 06/20/24 1224)  metoprolol  succinate (TOPROL -XL) 24 hr tablet 50 mg (has no administration in time range)  rosuvastatin  (CRESTOR ) tablet 20 mg (20 mg Oral Given 06/20/24 1840)  perflutren  lipid microspheres (DEFINITY ) IV suspension (3 mLs Intravenous Given 06/20/24 1520)  aspirin  EC tablet 81 mg (has no administration in time range)  furosemide  (LASIX ) injection 20 mg (20 mg Intravenous Given 06/20/24 1054)  aspirin  chewable tablet 324 mg (324 mg Oral Given 06/20/24 1055)  heparin  bolus via infusion 4,000 Units (4,000 Units Intravenous Bolus from Bag 06/20/24 1226)  furosemide  (LASIX ) injection 80 mg (80 mg Intravenous Given 06/20/24 1610)    _______________________________________________________ ER Provider Called:       DrGOMEZ  They Recommend admit to medicine *** Will see in AM  ***SEEN in ER     ED Triage Vitals  Encounter Vitals Group     BP 06/20/24 0902 118/83     Girls Systolic BP Percentile --      Girls  Diastolic BP Percentile --      Boys Systolic BP Percentile --      Boys Diastolic BP Percentile --      Pulse Rate 06/20/24 0902 75     Resp 06/20/24 0902 (!) 22     Temp 06/20/24 0902 98.2 F (36.8 C)     Temp Source 06/20/24 0902 Oral     SpO2 06/20/24 0901 (!) 83 %     Weight 06/20/24 1200 163 lb 2.3 oz (74 kg)     Height --      Head Circumference --      Peak Flow --      Pain Score 06/20/24 0916 7     Pain Loc --      Pain Education --      Exclude from Growth Chart --   UFJK(75)@     _________________________________________ Significant initial  Findings: Abnormal Labs Reviewed  CBC WITH DIFFERENTIAL/PLATELET - Abnormal; Notable for the following components:      Result Value   Hemoglobin 10.6 (*)    HCT 33.9 (*)    RDW 16.7 (*)    All other components within normal limits  COMPREHENSIVE METABOLIC PANEL WITH GFR - Abnormal; Notable for the following components:   Glucose, Bld 150 (*)    BUN 25 (*)    Creatinine, Ser 1.26 (*)    AST 53 (*)    Alkaline Phosphatase 219 (*)    Total Bilirubin 1.6 (*)    GFR, Estimated 43 (*)    All other components within normal limits  PRO BRAIN NATRIURETIC PEPTIDE - Abnormal; Notable for the following components:   Pro Brain Natriuretic Peptide 10,760.0 (*)    All other components within normal limits  PROTIME-INR - Abnormal; Notable for the following components:   Prothrombin Time 26.6 (*)    INR 2.3 (*)    All other components within normal limits  I-STAT CHEM 8, ED - Abnormal; Notable for the following components:   BUN 25 (*)    Creatinine, Ser 1.30 (*)    Glucose, Bld 148 (*)    Hemoglobin 10.9 (*)    HCT 32.0 (*)    All other components within normal limits  TROPONIN T, HIGH SENSITIVITY - Abnormal; Notable for the following components:   Troponin T High Sensitivity 351 (*)    All other components within normal limits  TROPONIN T, HIGH SENSITIVITY - Abnormal; Notable for the following components:   Troponin T High  Sensitivity 799 (*)    All other components within normal limits  TROPONIN T, HIGH SENSITIVITY - Abnormal; Notable for the following components:   Troponin T High Sensitivity 830 (*)    All other components within normal limits      _________________________ Troponin ***ordered Cardiac Panel (last 3 results) No results for input(s): CKTOTAL, CKMB, TROPONINIHS, RELINDX in the last 72 hours.   ECG: Ordered Personally reviewed and interpreted by me showing: HR : *** Rhythm: *NSR, Sinus tachycardia * A.fib. W RVR, RBBB, LBBB, Paced Ischemic changes*nonspecific changes, no evidence of ischemic changes QTC*  BNP (last 3 results) Recent Labs    06/28/23 1149  BNP 500.3*      No results for input(s): DDIMER, FERRITIN, LDH, CRP in the last 72 hours.    ____________________ This patient meets SIRS Criteria and may be septic. SIRS = Systemic Inflammatory Response Syndrome  Order a lactic acid level if needed AND/OR Initiate the sepsis protocol with the attached order set OR Click Treating Associated Infection or Illness if the patient is being treated for an infection that is a known cause of these abnormalities     The recent clinical data is shown below. Vitals:   06/20/24 1800 06/20/24 1830 06/20/24 1900 06/20/24 2000  BP: (!) 125/91   (!) 113/91  Pulse: 82 80 78 76  Resp:    (!) 27  Temp:      TempSrc:      SpO2: 95%  95% 96%  Weight:            WBC     Component Value Date/Time   WBC 7.3 06/20/2024 0909   LYMPHSABS 0.9 06/20/2024 0909   LYMPHSABS 1.9 12/21/2012 1043   MONOABS 0.7 06/20/2024 0909   MONOABS 0.5 12/21/2012 1043   EOSABS 0.1 06/20/2024 0909   EOSABS 0.1 12/21/2012 1043   BASOSABS 0.1 06/20/2024 0909   BASOSABS 0.1 12/21/2012 1043        Lactic Acid, Venous No results found for: LATICACIDVEN   Lactic Acid, Venous No results found for: LATICACIDVEN  Procalcitonin *** Ordered      UA *** no evidence of UTI   ***Pending ***not  ordered   Urine analysis:    Component Value Date/Time   COLORURINE YELLOW 10/21/2012 1058   APPEARANCEUR CLOUDY (A) 10/21/2012 1058   LABSPEC 1.008 10/21/2012 1058   PHURINE 7.0 10/21/2012 1058   GLUCOSEU NEGATIVE 10/21/2012 1058   GLUCOSEU NEGATIVE 04/26/2011 1634   HGBUR NEGATIVE 10/21/2012 1058   BILIRUBINUR NEGATIVE 10/21/2012 1058   KETONESUR NEGATIVE 10/21/2012 1058   PROTEINUR NEGATIVE 10/21/2012 1058   UROBILINOGEN 0.2 10/21/2012 1058   NITRITE NEGATIVE 10/21/2012 1058   LEUKOCYTESUR TRACE (A) 10/21/2012 1058    Results for orders placed or performed during the hospital encounter of 06/20/24  Resp panel by RT-PCR (RSV, Flu A&B, Covid) Anterior Nasal Swab     Status: None   Collection Time: 06/20/24  9:08 AM   Specimen: Anterior Nasal Swab  Result Value Ref Range Status   SARS Coronavirus 2 by RT PCR NEGATIVE NEGATIVE Final   Influenza A by PCR NEGATIVE NEGATIVE Final   Influenza B by PCR NEGATIVE NEGATIVE Final    Comment: (NOTE) The Xpert Xpress SARS-CoV-2/FLU/RSV plus assay is intended as an aid in the diagnosis of influenza from Nasopharyngeal swab specimens and should not be used as a sole basis for treatment. Nasal washings and aspirates are unacceptable for Xpert Xpress SARS-CoV-2/FLU/RSV testing.  Fact Sheet for Patients: bloggercourse.com  Fact Sheet for Healthcare Providers: seriousbroker.it  This test is not yet approved or cleared by the [place]  FDA and has been authorized for detection and/or diagnosis of SARS-CoV-2 by FDA under an Emergency Use Authorization (EUA). This EUA will remain in effect (meaning this test can be used) for the duration of the COVID-19 declaration under Section 564(b)(1) of the Act, 21 U.S.C. section 360bbb-3(b)(1), unless the authorization is terminated or revoked.     Resp Syncytial Virus by PCR NEGATIVE NEGATIVE Final    Comment: (NOTE) Fact  Sheet for Patients: bloggercourse.com  Fact Sheet for Healthcare Providers: seriousbroker.it  This test is not yet approved or cleared by the [place]  FDA and has been authorized for detection and/or diagnosis of SARS-CoV-2 by FDA under an Emergency Use Authorization (EUA). This EUA will remain in effect (meaning this test can be used) for the duration of the COVID-19 declaration under Section 564(b)(1) of the Act, 21 U.S.C. section 360bbb-3(b)(1), unless the authorization is terminated or revoked.  Performed at [facility], [address], [place], [place] [address]    *Note: Due to a large number of results and/or encounters for the requested time period, some results have not been displayed. A complete set of results can be found in Results Review.    ABX started Antibiotics Given (last 72 hours)     None        No results found for the last 90 days.    ________________________________________________________________  Arterial ***Venous  Blood Gas result:  pH *** pCO2 ***; pO2 ***;     %O2 Sat ***.  ABG    Component Value Date/Time   PHART 7.420 12/14/2013 1106   PCO2ART 40.6 12/14/2013 1106   PO2ART 104.0 (H) 12/14/2013 1106   HCO3 26.3 (H) 12/14/2013 1106   TCO2 23 06/20/2024 0920   ACIDBASEDEF 2.0 12/14/2013 1009   O2SAT 98.0 12/14/2013 1106       __________________________________________________________ Recent Labs  Lab 06/20/24 0909 06/20/24 0920 06/20/24 1600  NA 138 141  --   K 4.0 4.0  --   CO2 23  --   --   GLUCOSE 150* 148*  --  BUN 25* 25*  --   CREATININE 1.26* 1.30*  --   CALCIUM  9.4  --   --   MG  --   --  1.8    Cr  * stable,  Up from baseline see below Lab Results  Component Value Date   CREATININE 1.30 (H) 06/20/2024   CREATININE 1.26 (H) 06/20/2024   CREATININE 1.26 (H) 01/02/2024    Recent Labs  Lab 06/20/24 0909  AST 53*  ALT 15  ALKPHOS 219*  BILITOT  1.6*  PROT 7.1  ALBUMIN 3.9   Lab Results  Component Value Date   CALCIUM  9.4 06/20/2024   PHOS 3.4 09/06/2021          Plt: Lab Results  Component Value Date   PLT 172 06/20/2024         Recent Labs  Lab 06/20/24 0909 06/20/24 0920  WBC 7.3  --   NEUTROABS 5.6  --   HGB 10.6* 10.9*  HCT 33.9* 32.0*  MCV 84.5  --   PLT 172  --     HG/HCT * stable,  Down *Up from baseline see below    Component Value Date/Time   HGB 10.9 (L) 06/20/2024 0920   HGB 11.0 (L) 01/30/2021 0857   HGB 12.7 12/21/2012 1043   HCT 32.0 (L) 06/20/2024 0920   HCT 33.7 (L) 01/30/2021 0857   HCT 37.5 12/21/2012 1043   MCV 84.5 06/20/2024 0909   MCV 83 01/30/2021 0857   MCV 85.1 12/21/2012 1043      No results for input(s): LIPASE, AMYLASE in the last 168 hours. No results for input(s): AMMONIA in the last 168 hours.    _______________________________________________ Hospitalist was called for admission for *** Acute systolic congestive heart failure (HCC) ***    The following Work up has been ordered so far:  Orders Placed This Encounter  Procedures   Critical Care   Resp panel by RT-PCR (RSV, Flu A&B, Covid) Anterior Nasal Swab   DG Chest 2 View   CBC with Differential   Comprehensive metabolic panel   Pro Brain natriuretic peptide   Protime-INR   Heparin  level (unfractionated)   Heparin  level (unfractionated)   Protime-INR   Magnesium    Diet NPO time specified Except for: Sips with Meds   Cardiac Monitoring - Continuous Indefinite   Strict intake and output   Daily weights   heparin  per pharmacy consult   monitoring by pharmacy   Inpatient consult to Cardiology   Consult to hospitalist   Consult to hospitalist   I-stat chem 8, ED (not at [facility], [facility] or [facility])   EKG 12-Lead   EKG 12-Lead   ECHOCARDIOGRAM COMPLETE     OTHER Significant initial  Findings:  labs showing:     DM  labs:  HbA1C: Recent Labs    01/02/24 1037  HGBA1C 6.1*       CBG (last  3)  No results for input(s): GLUCAP in the last 72 hours.        Cultures:    Component Value Date/Time   SDES URINE, RANDOM 10/29/2013 2121   SPECREQUEST NONE 10/29/2013 2121   CULT  10/29/2013 2015    NO GROWTH 5 DAYS Performed at [facility]   REPTSTATUS 10/30/2013 FINAL 10/29/2013 2121     Radiological Exams on Admission: DG Chest 2 View Result Date: 06/20/2024 CLINICAL DATA:  Short of breath EXAM: CHEST - 2 VIEW COMPARISON:  Prior chest x-ray 03/30/2023 FINDINGS: Stable cardiomegaly. Atherosclerotic  calcifications are present in transverse aorta. Patient is status post median sternotomy with evidence of prior aortic valve replacement. Increased pulmonary vascular congestion with mild interstitial edema. A few Kerley B-lines are present in the periphery of the lungs. No focal airspace infiltrate or pleural effusion. Chronic bronchitic changes persist. IMPRESSION: 1. Increased pulmonary vascular congestion now with slight interstitial edema. Findings suggest early CHF. 2. Surgical changes of prior median sternotomy for aortic valve replacement and CABG. Electronically Signed   By: Wilkie Lent M.D.   On: 06/20/2024 09:56   _______________________________________________________________________________________________________ Latest  Blood pressure (!) 113/91, pulse 76, temperature 99.4 F (37.4 C), temperature source Oral, resp. rate (!) 27, weight 74 kg, SpO2 96%.   Vitals  labs and radiology finding personally reviewed  Review of Systems:    Pertinent positives include: ***  Constitutional:  No weight loss, night sweats, Fevers, chills, fatigue, weight loss  HEENT:  No headaches, Difficulty swallowing,Tooth/dental problems,Sore throat,  No sneezing, itching, ear ache, nasal congestion, post nasal drip,  Cardio-vascular:  No chest pain, Orthopnea, PND, anasarca, dizziness, palpitations.no Bilateral lower extremity swelling  GI:  No heartburn, indigestion,  abdominal pain, nausea, vomiting, diarrhea, change in bowel habits, loss of appetite, melena, blood in stool, hematemesis Resp:  no shortness of breath at rest. No dyspnea on exertion, No excess mucus, no productive cough, No non-productive cough, No coughing up of blood.No change in color of mucus.No wheezing. Skin:  no rash or lesions. No jaundice GU:  no dysuria, change in color of urine, no urgency or frequency. No straining to urinate.  No flank pain.  Musculoskeletal:  No joint pain or no joint swelling. No decreased range of motion. No back pain.  Psych:  No change in mood or affect. No depression or anxiety. No memory loss.  Neuro: no localizing neurological complaints, no tingling, no weakness, no double vision, no gait abnormality, no slurred speech, no confusion  All systems reviewed and apart from HOPI all are negative _______________________________________________________________________________________________ Past Medical History:   Past Medical History:  Diagnosis Date   Anemia    Anxiety    Aortic stenosis    a. Now has St Jude mechanical aortic valve (6/00). b. Echo (6/15) with EF 60-65%, mechanical aortic valve with mean gradient 27 mmHg.   Arthritis    a. Possible c-spine arthritis with pain down left arm.     Carotid artery disease    a. Carotid dopplers (7/16) with 40-59% BICA stenosis.     Chronic angle-closure glaucoma(365.23)    Chronic diastolic CHF (congestive heart failure) (HCC)    a.  RHC (7/15) with mean RA 2, PA 22/11, mean PCWP 9, CI 3.79.   Contrast media allergy    Coronary atherosclerosis of native coronary artery    a. CABG at time of AVR in 6/00 with RIMA-RCA. b. Abnl nuc 11/2013 -> LHC (7/15) with patent RIMA-RCA, 80% mRCA, 80% pLAD with FFR 0.73, treated with DES to pLAD.   Depression    Essential hypertension    GERD (gastroesophageal reflux disease)    Hyperlipidemia    Low back pain    Neuromuscular disorder (HCC)    neuropathy    Peripheral vascular disease    a, H/o Right SFA stent. b. Peripheral arterial dopplers (7/16) with right SFA stent patent.     PONV (postoperative nausea and vomiting)    N&V   Type II diabetes mellitus (HCC)       Past Surgical History:  Procedure Laterality Date  BILATERAL OOPHORECTOMY  05/24/1985   BREAST BIOPSY  05/24/2010   left   CARDIAC CATHETERIZATION  01/14/1999   normal LV function, severe aortic stenosis; 80% and 70% stenosis in RCA; mild 20% distal norrowing in L main with 20% proximal LAD stenosis, 40% diagonal stenosis and 20% proximal circumflex stenosis   CARDIAC VALVE REPLACEMENT  05/24/1998   aortic valve    CARDIOVASCULAR STRESS TEST  08/25/2011   R/L MV - EF 72%; no scintigraphic evidence of inducible MI; normal perfusionTID of 1.25 elevated - could indicate small vessle subendocardial ischemia; EKG NSR at 66, non diagnostic for ischemia   CARPAL TUNNEL RELEASE  05/25/1987   bilaterally   CATARACT EXTRACTION W/ INTRAOCULAR LENS  IMPLANT, BILATERAL  ~ 2007   CESAREAN SECTION  1969; 1971   CHOLECYSTECTOMY  05/24/1988   COLONOSCOPY N/A 10/27/2012   Procedure: COLONOSCOPY;  Surgeon: Belvie JONETTA Just, MD;  Location: [facility];  Service: Endoscopy;  Laterality: N/A;   CORONARY ARTERY BYPASS GRAFT  05/24/1998   RIMA to RCA   CORONARY/GRAFT ANGIOGRAPHY N/A 09/07/2021   Procedure: CORONARY/GRAFT ANGIOGRAPHY;  Surgeon: Wendel Lurena POUR, MD;  Location: [facility] INVASIVE CV LAB;  Service: Cardiovascular;  Laterality: N/A;   DOPPLER ECHOCARDIOGRAPHY  03/29/2012   EF >55%; mild concentric LVH; stage 1 diastolic dysfunction, elevated LV filling pressure, dilated LA; MAC mild MR; St Jude AVR peak and mean gradients of and ; transvalvular gradients have increased (prev 23 and 14 respectively)   ESOPHAGOGASTRODUODENOSCOPY N/A 10/27/2012   Procedure: ESOPHAGOGASTRODUODENOSCOPY (EGD);  Surgeon: Belvie JONETTA Just, MD;  Location: [facility];  Service: Endoscopy;  Laterality:  N/A;   FEMORAL ARTERY STENT  09/20/2011   6 x 40 Smart Nitinol self-expanding stent placed;  10/15/2031 -R SFA stent open and patent w/o evidence of restenosis   FRACTIONAL FLOW RESERVE WIRE  12/14/2013   Procedure: FRACTIONAL FLOW RESERVE WIRE;  Surgeon: Ezra GORMAN Shuck, MD;  Location: [facility] CATH LAB;  Service: Cardiovascular;;   KNEE SURGERY Left    LACERATION REPAIR     right hand   LEFT AND RIGHT HEART CATHETERIZATION WITH CORONARY ANGIOGRAM N/A 12/14/2013   Procedure: LEFT AND RIGHT HEART CATHETERIZATION WITH CORONARY ANGIOGRAM;  Surgeon: Ezra GORMAN Shuck, MD;  Location: [facility] CATH LAB;  Service: Cardiovascular;  Laterality: N/A;   LOWER EXTREMITY ANGIOGRAM N/A 09/20/2011   Procedure: LOWER EXTREMITY ANGIOGRAM;  Surgeon: Dorn JINNY Lesches, MD;  Location: [facility] C[facility] LAB;  Service: Cardiovascular;  Laterality: N/A;   LYMPH NODE BIOPSY  06/25/2011   core needle on 5   needle biopsy  05/25/2007   on ankles for nerve damage   NEUROPLASTY / TRANSPOSITION ULNAR NERVE AT ELBOW     right   ORIF FEMUR FRACTURE Left 09/09/2021   Procedure: OPEN REDUCTION INTERNAL FIXATION (ORIF) DISTAL FEMUR FRACTURE;  Surgeon: Kendal Franky SQUIBB, MD;  Location: [facility] OR;  Service: Orthopedics;  Laterality: Left;   PERCUTANEOUS CORONARY STENT INTERVENTION (PCI-S)  12/14/2013   Procedure: PERCUTANEOUS CORONARY STENT INTERVENTION (PCI-S);  Surgeon: Ezra GORMAN Shuck, MD;  Location: [facility] CATH LAB;  Service: Cardiovascular;;  Prox LAD 3.00x12 Promus DES    PERIPHERAL ARTERIAL STENT GRAFT  09/20/2011   right SFA   REFRACTIVE SURGERY  ` 2004   for glaucoma   TONSILLECTOMY  05/24/1966   TRIGGER FINGER RELEASE Left 12/04/2015   Procedure: LEFT RING FINGER TRIGGER RELEASE;  Surgeon: Franky Curia, MD;  Location: [facility];  Service: Orthopedics;  Laterality: Left;   VAGINAL HYSTERECTOMY  05/25/1983   Fibroids    Social History:  Ambulatory *** independently cane, walker  wheelchair bound, bed bound     reports  that she has never smoked. She has never used smokeless tobacco. She reports that she does not currently use alcohol . She reports that she does not currently use drugs.     Family History: *** Family History  Problem Relation Age of Onset   Colon cancer Mother    COPD Mother    Emphysema Mother    Heart disease Brother    Cancer Maternal Grandfather    Cancer Maternal Grandmother    Drug abuse Daughter    Crohn's disease Son    Diabetes Neg Hx    ______________________________________________________________________________________________ Allergies: Allergies[1]   Prior to Admission medications  Medication Sig Start Date End Date Taking? Authorizing Provider  warfarin (COUMADIN ) 3 MG tablet TAKE 1/2 TO 1 TABLET BY MOUTH DAILY AS DIRECTED Patient taking differently: TAKE 1/2 TO 1 TABLET BY MOUTH DAILY AS DIRECTED 03/13/24  Yes Court Dorn PARAS, MD  ALPRAZolam  (XANAX ) 0.5 MG tablet Take 1 tablet (0.5 mg total) by mouth at bedtime as needed. 06/04/24   Dohmeier, Dedra, MD  Ascorbic Acid  (VITAMIN C ) 1000 MG tablet Take 1,000 mg by mouth every evening.    [provider]  aspirin  EC 81 MG tablet Take 81 mg by mouth every evening. 02/21/15   Rolan Ezra RAMAN, MD  Blood Glucose Monitoring Suppl DEVI 1 each by Does not apply route daily. May substitute to any manufacturer covered by patient's insurance. 01/02/24   Medina-Vargas, Monina C, NP  Calcium  250 MG CAPS Take 250 mg by mouth.    [provider]  Cholecalciferol (VITAMIN D3) 125 MCG (5000 UT) TABS Take by mouth.    [provider]  cyanocobalamin  (VITAMIN B12) 1000 MCG tablet Take 1,000 mcg by mouth daily.    [provider]  fluticasone  (FLONASE ) 50 MCG/ACT nasal spray Place 2 sprays into both nostrils as needed for allergies or rhinitis.    [provider]  folic acid  (FOLVITE ) 800 MCG tablet Take 800 mcg by mouth daily.    [provider]  Glucose Blood (BLOOD GLUCOSE TEST  STRIPS) STRP 1 each by In Vitro route daily. May substitute to any manufacturer covered by patient's insurance. 01/02/24   Medina-Vargas, Monina C, NP  Lancet Device MISC 1 each by Does not apply route daily. May substitute to any manufacturer covered by patient's insurance. 01/02/24   Medina-Vargas, Monina C, NP  Lancets Misc. MISC 1 each by Does not apply route daily. May substitute to any manufacturer covered by patient's insurance. 01/02/24   Medina-Vargas, Monina C, NP  latanoprost  (XALATAN ) 0.005 % ophthalmic solution PLACE 1 DROP INTO BOTH EYES AT BEDTIME. 07/25/14   Rollene Almarie LABOR, MD  Magnesium  Glycinate 100 MG CAPS Take 100 mg by mouth at bedtime. 03/28/24   Rhys Boyer T, PA-C  metoprolol  succinate (TOPROL -XL) 50 MG 24 hr tablet TAKE 1 TABLET BY MOUTH DAILY WITH OR IMMEDIATELY FOLLOWING A MEAL. 06/08/24   Court Dorn PARAS, MD  Multiple Vitamin (MULTIVITAMIN WITH MINERALS) TABS tablet Take 2 tablets by mouth daily.    [provider]  nitroGLYCERIN  (NITROSTAT ) 0.4 MG SL tablet Place 1 tablet (0.4 mg total) under the tongue every 5 (five) minutes as needed for chest pain. 04/01/23   Patel, Sona, MD  pantoprazole  (PROTONIX ) 40 MG tablet Take 1 tablet (40 mg total) by mouth daily. 06/24/23   Medina-Vargas, Monina  C, NP  potassium chloride  SA (KLOR-CON  M) 20 MEQ tablet TAKE 1 TABLET BY MOUTH 2 TIMES A DAY. INCREASE TO 2 TABLETS BY MOUTH 2 TIMES A DAY FOR 3 DAYS WHILE ON HIGHER DOSE OF TORSEMIDE , THEN BACK DOWN TO 1 TABLET TWICE DAILY 06/11/24   Berry, Jonathan J, MD  Psyllium (METAMUCIL) 0.36 g CAPS Take 1 tablet by mouth daily as needed. 09/26/23   Medina-Vargas, Monina C, NP  rosuvastatin  (CRESTOR ) 20 MG tablet TAKE 1 TABLET BY MOUTH EVERY EVENING 06/18/24   Ngetich, Dinah C, NP  sertraline  (ZOLOFT ) 100 MG tablet Take 2.5 tablets (250 mg total) by mouth daily. 05/11/24   Rhys Boyer T, PA-C  torsemide  (DEMADEX ) 20 MG tablet Take 1 tablet (20 mg total) by mouth daily. increase her  Torsemide  to 20mg  twice daily for 3 days THEN BACK TO ONCE DAILY 07/04/23   Goodrich, Callie E, PA-C  traZODone  (DESYREL ) 100 MG tablet Take 1-2 tablets (100-200 mg total) by mouth at bedtime as needed. 05/11/24   Rhys Boyer T, PA-C  valsartan  (DIOVAN ) 40 MG tablet TAKE 1 TABLET BY MOUTH DAILY 11/09/23   Medina-Vargas, Monina C, NP    ___________________________________________________________________________________________________ Physical Exam:    06/20/2024    8:00 PM 06/20/2024    7:00 PM 06/20/2024    6:30 PM  Vitals with BMI  Systolic 113    Diastolic 91    Pulse 76 78 80     1. General:  in No ***Acute distress***increased work of breathing ***complaining of severe pain****agitated * Chronically ill *well *cachectic *toxic acutely ill -appearing 2. Psychological: Alert and *** Oriented 3. Head/ENT:   Moist *** Dry Mucous Membranes                          Head Non traumatic, neck supple                          Normal *** Poor Dentition 4. SKIN: normal *** decreased Skin turgor,  Skin clean Dry and intact no rash    5. Heart: Regular rate and rhythm no*** Murmur, no Rub or gallop 6. Lungs: ***Clear to auscultation bilaterally, no wheezes or crackles   7. Abdomen: Soft, ***non-tender, Non distended *** obese ***bowel sounds present 8. Lower extremities: no clubbing, cyanosis, no ***edema 9. Neurologically Grossly intact, moving all 4 extremities equally *** strength 5 out of 5 in all 4 extremities cranial nerves II through XII intact 10. MSK: Normal range of motion    Chart has been reviewed  ______________________________________________________________________________________________  Assessment/Plan  ***  Admitted for *** Acute systolic congestive heart failure (HCC) ***    Present on Admission:  Acute on chronic diastolic heart failure (HCC)     No problem-specific Assessment & Plan notes found for this encounter.    Other plan as per orders.  DVT  prophylaxis:  SCD *** Lovenox        Code Status:    Code Status: Prior FULL CODE *** DNR/DNI ***comfort care as per patient ***family  I had personally discussed CODE STATUS with patient and family*  ACP *** none has been reviewed ***   Family Communication:   Family not at  Bedside  plan of care was discussed on the phone with *** Son, Daughter, Wife, Husband, Sister, Brother , father, mother  Diet  Diet Orders (From admission, onward)     Start     Ordered  06/21/24 0001  Diet NPO time specified Except for: Sips with Meds  Diet effective midnight       Question:  Except for  Answer:  Noralyn with Meds   06/20/24 2021            Disposition Plan:   *** likely will need placement for rehabilitation                          Back to current facility when stable                            To home once workup is complete and patient is stable  ***Following barriers for discharge:                             Chest pain *** Stroke *** Syncope ***work up is complete                            Electrolytes corrected                               Anemia corrected h/H stable                             Pain controlled with PO medications                               Afebrile, white count improving able to transition to PO antibiotics                             Will need to be able to tolerate PO                            Will likely need home health, home O2, set up                           Will need consultants to evaluate patient prior to discharge                           Work of breathing improves       Consult Orders  (From admission, onward)           Start     Ordered   06/20/24 1922  Consult to hospitalist  Paged By Alfonso already paged to [facility] @ 19:14pm  Once       Provider:  (Not yet assigned)  Question Answer Comment  Place call to: [facility]   Reason for Consult Admit      06/20/24 1921   06/20/24 1903  Consult to hospitalist  Paged By  Andrea@19 :14  Once       Provider:  (Not yet assigned)  Question Answer Comment  Place call to: [facility]   Reason for Consult Admit      06/20/24 1902                              ***  Would benefit from PT/OT eval prior to DC  Ordered                   Swallow eval - SLP ordered                   Diabetes care coordinator                   Transition of care consulted                   Nutrition    consulted                  Wound care  consulted                   Palliative care    consulted                   Behavioral health  consulted                    Consults called: ***  NONE   Admission status:  ED Disposition     ED Disposition  Admit   Condition  --   Comment  The patient appears reasonably stabilized for admission considering the current resources, flow, and capabilities available in the ED at this time, and I doubt any other [facility] requiring further screening and/or treatment in the ED prior to admission is  present.           Obs***  ***  inpatient     I Expect 2 midnight stay secondary to severity of patient's current illness need for inpatient interventions justified by the following: ***hemodynamic instability despite optimal treatment (tachycardia *hypotension * tachypnea *hypoxia, hypercapnia) *** Severe lab/radiological/exam abnormalities including:    Acute systolic congestive heart failure (HCC) ***  and extensive comorbidities including: *substance abuse  *Chronic pain *DM2  * CHF * CAD  * COPD/asthma *Morbid Obesity * CKD *dementia *liver disease *history of stroke with residual deficits *  malignancy, * sickle cell disease  History of amputation Chronic anticoagulation  That are currently affecting medical management.   I expect  patient to be hospitalized for 2 midnights requiring inpatient medical care.  Patient is at high risk for adverse outcome (such as loss of life or disability) if not  treated.  Indication for inpatient stay as follows:  Severe change from baseline regarding mental status Hemodynamic instability despite maximal medical therapy,  severe pain requiring acute inpatient management,  inability to maintain oral hydration   persistent chest pain despite medical management Need for operative/procedural  intervention New or worsening hypoxia ongoing suicidal ideations   Need for IV antibiotics, IV fluids,, IV pain medications, IV anticoagulation,  IV rate controling medications, IV antihypertensives need for biPAP Frequent labs    Level of care   *** tele  For 12H 24H     medical floor       progressive     stepdown   tele indefinitely please discontinue once patient no longer qualifies COVID-19 Labs    Critical***  Patient is critically ill due to  hemodynamic instability * respiratory failure *severe sepsis* ongoing chest pain*  They are at high risk for life/limb threatening clinical deterioration requiring frequent reassessment and modifications of care.  Services provided include examination of the patient, review of relevant ancillary tests, prescription of lifesaving therapies, review of medications and prophylactic therapy.  Total critical care time  excluding separately billable procedures: 60*  Minutes.    Skyra Crichlow 06/20/2024, 8:23 PM ***  [facility]     after 2 AM please page floor coverage   If 7AM-7PM, please contact the day team taking care of the patient using [email or web address]        [1]  Allergies Allergen Reactions   Atorvastatin      Muscle pain   Dapagliflozin     Other reaction(s): nausea   Gabapentin      Other reaction(s): stomach issues   Simvastatin  Other (See Comments)    Muscle pain   Sulfa  Antibiotics     unknown   Iodinated Contrast Media Rash     Red rash after cardiac cath 1 wk ago, ? Contrast allergy, requires 13 hr prep now per [email or web address]     "

## 2024-06-20 NOTE — Progress Notes (Signed)
 PHARMACY - ANTICOAGULATION CONSULT NOTE  Pharmacy Consult for IV heparin  Indication: chest pain/ACS  Allergies[1]  Patient Measurements:    Vital Signs: Temp: 98.2 F (36.8 C) (01/28 0902) Temp Source: Oral (01/28 0902) BP: 118/83 (01/28 0902) Pulse Rate: 72 (01/28 1045)  Labs: Recent Labs    06/20/24 0909 06/20/24 0920  HGB 10.6* 10.9*  HCT 33.9* 32.0*  PLT 172  --   CREATININE 1.26* 1.30*    CrCl cannot be calculated (Unknown ideal weight.).   Medical History: Past Medical History:  Diagnosis Date   Anemia    Anxiety    Aortic stenosis    a. Now has [employer] Jude mechanical aortic valve (6/00). b. Echo (6/15) with EF 60-65%, mechanical aortic valve with mean gradient 27 mmHg.   Arthritis    a. Possible c-spine arthritis with pain down left arm.     Carotid artery disease    a. Carotid dopplers (7/16) with 40-59% BICA stenosis.     Chronic angle-closure glaucoma(365.23)    Chronic diastolic CHF (congestive heart failure) (HCC)    a.  RHC (7/15) with mean RA 2, PA 22/11, mean PCWP 9, CI 3.79.   Contrast media allergy    Coronary atherosclerosis of native coronary artery    a. CABG at time of AVR in 6/00 with RIMA-RCA. b. Abnl nuc 11/2013 -> LHC (7/15) with patent RIMA-RCA, 80% mRCA, 80% pLAD with FFR 0.73, treated with DES to pLAD.   Depression    Essential hypertension    GERD (gastroesophageal reflux disease)    Hyperlipidemia    Low back pain    Neuromuscular disorder (HCC)    neuropathy   Peripheral vascular disease    a, H/o Right SFA stent. b. Peripheral arterial dopplers (7/16) with right SFA stent patent.     PONV (postoperative nausea and vomiting)    N&V   Type II diabetes mellitus (HCC)    Assessment: Abigail Oliver is a 79 y.o. year old female presented on 06/20/2024 with concern for CP and ACS. On warfarin prior to admission for AVR. Prior to admission regimen, warfarin 1.5mg  STTh and 3mg  all other days (Last dose prior to admission 1/27  afternoon). Pharmacy consulted for warfarin inpatient management.   Date INR Warfarin Dose  1/28 2.3, slightly subtherapeutic    Goal of Therapy:  INR goal: 2.5-3.5 Heparin  level 0.3-0.7 units/ml Monitor platelets by anticoagulation protocol: Yes   Plan:  Will start heparin  now given INR below goal and hold warfarin for now Heparin  4000 units x 1 as bolus followed by heparin  infusion at 850 units/hr 8h heparin  level  Daily heparin  level, CBC, and monitoring for bleeding F/u plans for anticoagulation and cards recs  Thank you for allowing pharmacy to participate in this patient's care.  Leonor GORMAN Bash, PharmD Emergency Medicine Clinical Pharmacist 06/20/2024,12:05 PM       [1]  Allergies Allergen Reactions   Atorvastatin      Muscle pain   Dapagliflozin     Other reaction(s): nausea   Gabapentin      Other reaction(s): stomach issues   Simvastatin  Other (See Comments)    Muscle pain   Sulfa  Antibiotics     unknown   Iodinated Contrast Media Rash     Red rash after cardiac cath 1 wk ago, ? Contrast allergy, requires 13 hr prep now per [email or web address]

## 2024-06-20 NOTE — ED Notes (Signed)
 Troponin 351

## 2024-06-21 ENCOUNTER — Encounter (HOSPITAL_COMMUNITY): Payer: Self-pay | Admitting: Internal Medicine

## 2024-06-21 DIAGNOSIS — I5033 Acute on chronic diastolic (congestive) heart failure: Secondary | ICD-10-CM | POA: Diagnosis not present

## 2024-06-21 DIAGNOSIS — Z7901 Long term (current) use of anticoagulants: Secondary | ICD-10-CM | POA: Diagnosis not present

## 2024-06-21 DIAGNOSIS — I214 Non-ST elevation (NSTEMI) myocardial infarction: Secondary | ICD-10-CM | POA: Diagnosis not present

## 2024-06-21 DIAGNOSIS — I359 Nonrheumatic aortic valve disorder, unspecified: Secondary | ICD-10-CM | POA: Diagnosis not present

## 2024-06-21 DIAGNOSIS — I5021 Acute systolic (congestive) heart failure: Secondary | ICD-10-CM | POA: Diagnosis not present

## 2024-06-21 LAB — COMPREHENSIVE METABOLIC PANEL WITH GFR
ALT: 14 U/L (ref 0–44)
AST: 58 U/L — ABNORMAL HIGH (ref 15–41)
Albumin: 3.4 g/dL — ABNORMAL LOW (ref 3.5–5.0)
Alkaline Phosphatase: 181 U/L — ABNORMAL HIGH (ref 38–126)
Anion gap: 12 (ref 5–15)
BUN: 34 mg/dL — ABNORMAL HIGH (ref 8–23)
CO2: 22 mmol/L (ref 22–32)
Calcium: 8.4 mg/dL — ABNORMAL LOW (ref 8.9–10.3)
Chloride: 104 mmol/L (ref 98–111)
Creatinine, Ser: 1.44 mg/dL — ABNORMAL HIGH (ref 0.44–1.00)
GFR, Estimated: 37 mL/min — ABNORMAL LOW
Glucose, Bld: 117 mg/dL — ABNORMAL HIGH (ref 70–99)
Potassium: 3.4 mmol/L — ABNORMAL LOW (ref 3.5–5.1)
Sodium: 138 mmol/L (ref 135–145)
Total Bilirubin: 1.4 mg/dL — ABNORMAL HIGH (ref 0.0–1.2)
Total Protein: 6.3 g/dL — ABNORMAL LOW (ref 6.5–8.1)

## 2024-06-21 LAB — CBG MONITORING, ED
Glucose-Capillary: 117 mg/dL — ABNORMAL HIGH (ref 70–99)
Glucose-Capillary: 157 mg/dL — ABNORMAL HIGH (ref 70–99)

## 2024-06-21 LAB — GLUCOSE, CAPILLARY
Glucose-Capillary: 99 mg/dL (ref 70–99)
Glucose-Capillary: 110 mg/dL — ABNORMAL HIGH (ref 70–99)
Glucose-Capillary: 139 mg/dL — ABNORMAL HIGH (ref 70–99)
Glucose-Capillary: 139 mg/dL — ABNORMAL HIGH (ref 70–99)

## 2024-06-21 LAB — CBC
HCT: 28.5 % — ABNORMAL LOW (ref 36.0–46.0)
Hemoglobin: 9 g/dL — ABNORMAL LOW (ref 12.0–15.0)
MCH: 26.4 pg (ref 26.0–34.0)
MCHC: 31.6 g/dL (ref 30.0–36.0)
MCV: 83.6 fL (ref 80.0–100.0)
Platelets: 138 10*3/uL — ABNORMAL LOW (ref 150–400)
RBC: 3.41 MIL/uL — ABNORMAL LOW (ref 3.87–5.11)
RDW: 16.9 % — ABNORMAL HIGH (ref 11.5–15.5)
WBC: 6.2 10*3/uL (ref 4.0–10.5)
nRBC: 0 % (ref 0.0–0.2)

## 2024-06-21 LAB — TROPONIN T, HIGH SENSITIVITY
Troponin T High Sensitivity: 862 ng/L (ref 0–19)
Troponin T High Sensitivity: 857 ng/L (ref 0–19)
Troponin T High Sensitivity: 950 ng/L (ref 0–19)
Troponin T High Sensitivity: 1096 ng/L (ref 0–19)

## 2024-06-21 LAB — PHOSPHORUS: Phosphorus: 2.4 mg/dL — ABNORMAL LOW (ref 2.5–4.6)

## 2024-06-21 LAB — PROTIME-INR
INR: 2.4 — ABNORMAL HIGH (ref 0.8–1.2)
INR: 3 — ABNORMAL HIGH (ref 0.8–1.2)
Prothrombin Time: 27.6 s — ABNORMAL HIGH (ref 11.4–15.2)
Prothrombin Time: 32.7 s — ABNORMAL HIGH (ref 11.4–15.2)

## 2024-06-21 LAB — HEPARIN LEVEL (UNFRACTIONATED)
Heparin Unfractionated: 0.28 [IU]/mL — ABNORMAL LOW (ref 0.30–0.70)
Heparin Unfractionated: 0.21 [IU]/mL — ABNORMAL LOW (ref 0.30–0.70)

## 2024-06-21 LAB — HEMOGLOBIN A1C
Hgb A1c MFr Bld: 5.7 % — ABNORMAL HIGH (ref 4.8–5.6)
Mean Plasma Glucose: 116.89 mg/dL

## 2024-06-21 LAB — MAGNESIUM: Magnesium: 1.7 mg/dL (ref 1.7–2.4)

## 2024-06-21 MED ORDER — SERTRALINE HCL 50 MG PO TABS
150.0000 mg | ORAL_TABLET | Freq: Every day | ORAL | Status: AC
  Administered 2024-06-21 – 2024-06-29 (×9): 150 mg via ORAL
  Filled 2024-06-21 (×9): qty 1

## 2024-06-21 MED ORDER — ACETAMINOPHEN 650 MG RE SUPP: 650.0000 mg | Freq: Four times a day (QID) | RECTAL | Status: AC | PRN

## 2024-06-21 MED ORDER — SODIUM CHLORIDE 0.9 % IV SOLN: 250.0000 mL | INTRAVENOUS | Status: AC | PRN

## 2024-06-21 MED ORDER — ACETAMINOPHEN 325 MG PO TABS: 650.0000 mg | ORAL_TABLET | Freq: Four times a day (QID) | ORAL | Status: AC | PRN

## 2024-06-21 MED ORDER — TRAZODONE HCL 50 MG PO TABS
100.0000 mg | ORAL_TABLET | Freq: Every evening | ORAL | Status: AC | PRN
  Administered 2024-06-21 – 2024-06-29 (×8): 100 mg via ORAL
  Filled 2024-06-21 (×9): qty 2

## 2024-06-21 MED ORDER — FUROSEMIDE 10 MG/ML IJ SOLN
40.0000 mg | Freq: Two times a day (BID) | INTRAMUSCULAR | Status: DC
  Administered 2024-06-21 – 2024-06-27 (×13): 40 mg via INTRAVENOUS
  Filled 2024-06-21 (×13): qty 4

## 2024-06-21 MED ORDER — ALPRAZOLAM 0.5 MG PO TABS
0.5000 mg | ORAL_TABLET | Freq: Every evening | ORAL | Status: DC | PRN
  Administered 2024-06-21 (×2): 0.5 mg via ORAL
  Filled 2024-06-21: qty 2
  Filled 2024-06-21: qty 1

## 2024-06-21 MED ORDER — INSULIN ASPART 100 UNIT/ML IJ SOLN
0.0000 [IU] | Freq: Three times a day (TID) | INTRAMUSCULAR | Status: AC
  Administered 2024-06-21 – 2024-06-23 (×2): 1 [IU] via SUBCUTANEOUS
  Administered 2024-06-24: 2 [IU] via SUBCUTANEOUS
  Administered 2024-06-25 – 2024-06-26 (×3): 1 [IU] via SUBCUTANEOUS
  Administered 2024-06-27: 2 [IU] via SUBCUTANEOUS
  Administered 2024-06-27: 1 [IU] via SUBCUTANEOUS
  Administered 2024-06-28: 2 [IU] via SUBCUTANEOUS
  Administered 2024-06-29: 1 [IU] via SUBCUTANEOUS
  Administered 2024-06-29: 2 [IU] via SUBCUTANEOUS
  Filled 2024-06-21: qty 2
  Filled 2024-06-21: qty 1
  Filled 2024-06-21 (×2): qty 2
  Filled 2024-06-21 (×2): qty 1
  Filled 2024-06-21: qty 2
  Filled 2024-06-21 (×3): qty 1

## 2024-06-21 MED ORDER — POTASSIUM CHLORIDE CRYS ER 20 MEQ PO TBCR
40.0000 meq | EXTENDED_RELEASE_TABLET | Freq: Once | ORAL | Status: AC
  Administered 2024-06-21: 40 meq via ORAL
  Filled 2024-06-21: qty 2

## 2024-06-21 MED ORDER — LATANOPROST 0.005 % OP SOLN
1.0000 [drp] | Freq: Every day | OPHTHALMIC | Status: AC
  Administered 2024-06-21 – 2024-06-29 (×9): 1 [drp] via OPHTHALMIC
  Filled 2024-06-21: qty 2.5

## 2024-06-21 MED ORDER — ONDANSETRON HCL 4 MG/2ML IJ SOLN: 4.0000 mg | Freq: Four times a day (QID) | INTRAMUSCULAR | Status: AC | PRN

## 2024-06-21 MED ORDER — SERTRALINE HCL 50 MG PO TABS: 250.0000 mg | ORAL_TABLET | Freq: Every day | ORAL | Status: DC

## 2024-06-21 MED ORDER — PANTOPRAZOLE SODIUM 40 MG PO TBEC
40.0000 mg | DELAYED_RELEASE_TABLET | Freq: Every day | ORAL | Status: DC
  Administered 2024-06-21 – 2024-06-26 (×6): 40 mg via ORAL
  Filled 2024-06-21 (×6): qty 1

## 2024-06-21 MED ORDER — ONDANSETRON HCL 4 MG PO TABS
4.0000 mg | ORAL_TABLET | Freq: Four times a day (QID) | ORAL | Status: AC | PRN
  Administered 2024-06-23: 4 mg via ORAL
  Filled 2024-06-21: qty 1

## 2024-06-21 MED ORDER — HYDROCODONE-ACETAMINOPHEN 5-325 MG PO TABS
1.0000 | ORAL_TABLET | ORAL | Status: AC | PRN
  Administered 2024-06-28 – 2024-06-29 (×2): 1 via ORAL
  Filled 2024-06-21 (×3): qty 1

## 2024-06-21 MED ORDER — SODIUM CHLORIDE 0.9% FLUSH
3.0000 mL | Freq: Two times a day (BID) | INTRAVENOUS | Status: AC
  Administered 2024-06-21 – 2024-06-29 (×14): 3 mL via INTRAVENOUS

## 2024-06-21 MED ORDER — SODIUM CHLORIDE 0.9% FLUSH: 3.0000 mL | INTRAVENOUS | Status: AC | PRN

## 2024-06-21 NOTE — Progress Notes (Addendum)
 Asked by nurse to clarify NPO order for cath. Patient is on cath schedule for tomorrow per chart review, pending final note attestation. NPO order is slated for 2/2. Preliminary notes indicate the decision will be based on INR level. Will give clears until 5am then NPO thereafter. Will send message to AM inbox to help clarify the plan, likely contingent on INR. If not planning to go for cath tomorrow, will need diet order and then NPO after MN for Monday ordered by rounding team.

## 2024-06-21 NOTE — Assessment & Plan Note (Signed)
 Appreciate cardiology involvement continue statin Crestor  20 mg daily continue Toprol  50 mg daily and daily aspirin 

## 2024-06-21 NOTE — Assessment & Plan Note (Signed)
 Continue Crestor 20 mg a day

## 2024-06-21 NOTE — Progress Notes (Signed)
 Nurse requested Mobility Specialist to perform oxygen saturation test with pt which includes removing pt from oxygen both at rest and while ambulating.  Below are the results from that testing.     Patient Saturations on Room Air at Rest = spO2 90%  Patient Saturations on Room Air while Ambulating = sp02 87% .  SABRA  Patient Saturations on 2 Liters of oxygen while Ambulating = sp02 95%  At end of testing pt left in room on 2  Liters of oxygen.  Reported results to nurse.

## 2024-06-21 NOTE — Progress Notes (Signed)
 PHARMACY - ANTICOAGULATION CONSULT NOTE  Pharmacy Consult for IV heparin  Indication: chest pain/ACS and mAVR  Allergies[1]  Patient Measurements: Height: 5' 2 (157.5 cm) Weight: 75.5 kg (166 lb 7.2 oz) IBW/kg (Calculated) : 50.1 HEPARIN  DW (KG): 66.5  Vital Signs: Temp: 98 F (36.7 C) (01/29 1614) Temp Source: Oral (01/29 1614) BP: 99/56 (01/29 1614) Pulse Rate: 69 (01/29 1614)  Labs: Recent Labs    06/20/24 0909 06/20/24 0920 06/20/24 1057 06/20/24 2034 06/20/24 2357 06/21/24 0407 06/21/24 0656 06/21/24 1855  HGB 10.6* 10.9*  --   --   --  9.0*  --   --   HCT 33.9* 32.0*  --   --   --  28.5*  --   --   PLT 172  --   --   --   --  138*  --   --   LABPROT  --   --  26.6*  --  32.7*  --   --  27.6*  INR  --   --  2.3*  --  3.0*  --   --  2.4*  HEPARINUNFRC  --   --   --  0.13*  --   --  0.28* 0.21*  CREATININE 1.26* 1.30*  --   --   --  1.44*  --   --     Estimated Creatinine Clearance: 30.7 mL/min (A) (by C-G formula based on SCr of 1.44 mg/dL (H)).  Assessment: Abigail Oliver is a 79 y.o. year old female presented on 06/20/2024 with concern for CP and ACS. On warfarin prior to admission for mAVR. Prior to admission regimen, warfarin 1.5mg  STTh and 3mg  all other days (Last dose prior to admission 1/27 afternoon). Pharmacy consulted for heparin  management.   Heparin  level is subtherapeutic at 0.28 on 1000 units/hr. No issues with the infusion or bleeding reported per RN. INR 2.3 > 3 > 2.4 (off warfarin since 1/28). Heparin  level subtherapeutic on current rate of 1100 units/hr.   Goal of Therapy:  INR goal: 2.5-3.5 Heparin  level 0.3-0.7 units/ml Monitor platelets by anticoagulation protocol: Yes   Plan:  Increase heparin  infusion to 1300 units/hr Check heparin  level in 8 hours and daily while on heparin  Continue to monitor H&H and platelets F/u plans for anticoagulation and Cards recs  Thank you for allowing pharmacy to be a part of this patients  care.  Shelba Collier, PharmD, BCPS Clinical Pharmacist    [1]  Allergies Allergen Reactions   Atorvastatin      Muscle pain   Gabapentin  Other (See Comments)    Stomach issues   Simvastatin  Other (See Comments)    Muscle pain   Sulfa  Antibiotics     unknown   Farxiga [Dapagliflozin] Nausea Only and Rash   Iodinated Contrast Media Rash     Red rash after cardiac cath 1 wk ago, ? Contrast allergy, requires 13 hr prep now per [email or web address]

## 2024-06-21 NOTE — Assessment & Plan Note (Signed)
-  chronic avoid nephrotoxic medications such as NSAIDs, Vanco Zosyn combo,  avoid hypotension, continue to follow renal function

## 2024-06-21 NOTE — Progress Notes (Addendum)
"  °  Progress Note  Patient Name: Abigail Oliver Date of Encounter: 06/21/2024 [facility] Cardiologist: Darryle ONEIDA Decent, MD   Interval Summary   Doing well, feels better  No significant changes in symptoms overnight  Reports good urine output   Vital Signs Vitals:   06/21/24 0626 06/21/24 0629 06/21/24 0815 06/21/24 0903  BP: (!) 109/42 (!) 116/43 (!) 103/40 (!) 109/43  Pulse: 66 71 68 69  Resp: (!) 22 20 18 20   Temp:   98.9 F (37.2 C)   TempSrc:   Oral   SpO2: 96% 99% 92% 98%  Weight: 75.5 kg     Height: 5' 2 (1.575 m)      No intake or output data in the 24 hours ending 06/21/24 0949    06/21/2024    6:26 AM 06/20/2024   12:00 PM 06/04/2024   12:48 PM  Last 3 Weights  Weight (lbs) 166 lb 7.2 oz 163 lb 2.3 oz 165 lb  Weight (kg) 75.5 kg 74 kg 74.844 kg     Telemetry/ECG  sinus - Personally Reviewed  Physical Exam  GEN: No acute distress.   Neck: No JVD Cardiac: RRR, + click.  Respiratory: decreased lung sounds at bases. GI: Soft, nontender, non-distended  MS: No edema  Assessment & Plan   Chest pain CAD s/p CABG RIMA-PAD in 2000, PCI to LAD 2015 Elevated troponin  Echo this admission showed no RWMA and preserved EF  Troponin level 799 ? 862 ? 950 ? 1,096 Reports some chest pain, suspected to be in the setting of CHF, patient overall is poor historian  Currently on IV heparin , last checked INR 3.0, pending INR level prior to cath  Suspect that patient would need R/LHC this admission given elevated troponin so would plan for early next week pending INR Informed Consent   Shared Decision Making/Informed Consent The risks [stroke (1 in 1000), death (1 in 1000), kidney failure [usually temporary] (1 in 500), bleeding (1 in 200), allergic reaction [possibly serious] (1 in 200)], benefits (diagnostic support and management of coronary artery disease) and alternatives of a cardiac catheterization were discussed in detail with Ms. Bellman and she is willing  to proceed.     Acute on chronic HFpEF Hypertension  Echo this admission: LVEF 55-60%, no RWMA, mild LVH, mild reduced RV systolic function, G2DD, dilated IVC proBNP elevated at 10,760 Started on IV Lasix  There have still been no UOP charted this admission but patient reports good output  Renal function trending slightly up today Presently on IV Lasix  40 mg BID, continue and monitor response  Currently on Toprol  50 mg daily   AS s/p mechanical AVR in 2000 Mild to moderate MR Moderate MAC Echo this admission showed normal structure and function of AV prosthesis VTI 1.27cm and mean gradient 11.7 mmHg, mild to moderate MR with moderate MAC INR 3.0 Currently on IV heparin  in place of warfarin  Suspect that patient would need R/LHC this admission given elevated troponin so would plan for early next week pending INR  Hyperlipidemia 01/02/2024: HDL 45; LDL Cholesterol (Calc) 60 8/70/7973: ALT 14  Continue Crestor  20 mg daily   For questions or updates, please contact [facility] Please consult [email or web address] for contact info under       Signed, Waddell DELENA Donath, PA-C   "

## 2024-06-21 NOTE — Plan of Care (Signed)
°  Problem: Coping: Goal: Ability to adjust to condition or change in health will improve Outcome: Progressing   Problem: Education: Goal: Knowledge of General Education information will improve Description: Including pain rating scale, medication(s)/side effects and non-pharmacologic comfort measures Outcome: Progressing   Problem: Activity: Goal: Risk for activity intolerance will decrease Outcome: Progressing   Problem: Coping: Goal: Level of anxiety will decrease Outcome: Progressing

## 2024-06-21 NOTE — Progress Notes (Signed)
 Mobility Specialist Progress Note:    06/21/24 1440  Mobility  Activity Ambulated with assistance  Level of Assistance Contact guard assist, steadying assist  Assistive Device Cane  Distance Ambulated (ft) 100 ft  Range of Motion/Exercises Active  Activity Response Tolerated fair  Mobility Referral Yes  Mobility visit 1 Mobility  Mobility Specialist Start Time (ACUTE ONLY) 1440  Mobility Specialist Stop Time (ACUTE ONLY) 1505  Mobility Specialist Time Calculation (min) (ACUTE ONLY) 25 min   Received pt laying in bed agreeable to session needing to do an O2 walk test. Pt able to transfer, stand, and ambulate w/ CGA. No c/o any symptoms. Pt w/ some instability but no particular LOB. Returned pt to bed w/ all needs met.   Venetia Keel Mobility Specialist Please Neurosurgeon or Rehab Office at [phone]

## 2024-06-21 NOTE — Assessment & Plan Note (Signed)
-   Pt diagnosed with CHF based on presence of the following:  PND, OA, rales on exam, With noted response to IV diuretic in ER  admit on telemetry,  cycle cardiac enzymes, noted elevated troponins  obtain serial ECG  to evaluate for ischemia as a cause of heart failure  monitor daily weight:  Filed Weights   06/20/24 1200  Weight: 74 kg        diurese with IV lasix    40 mg IV BID    and monitor orthostatics and creatinine to avoid over diuresis.  Order echogram to evaluate EF and valves  ACE/ARBi ordered    cardiology consulted

## 2024-06-21 NOTE — Assessment & Plan Note (Signed)
 Continue Toprol  50 mg daily if BP allows restart Diovan 

## 2024-06-21 NOTE — Assessment & Plan Note (Addendum)
 Patient followed by Dr. Court Patient states that she was told her vascular studies or procedures could be advanced if she was to be hospitalized may need to get this clarified in the morning

## 2024-06-21 NOTE — Care Management Obs Status (Signed)
 MEDICARE OBSERVATION STATUS NOTIFICATION   Patient Details  Name: Abigail Oliver MRN: [id number] Date of Birth: 09-05-1945   Medicare Observation Status Notification Given:  Yes    Vonzell Arrie Sharps 06/21/2024, 9:23 AM

## 2024-06-21 NOTE — Progress Notes (Signed)
 PHARMACY - ANTICOAGULATION CONSULT NOTE  Pharmacy Consult for IV heparin  Indication: chest pain/ACS and mAVR  Allergies[1]  Patient Measurements: Height: 5' 2 (157.5 cm) Weight: 75.5 kg (166 lb 7.2 oz) IBW/kg (Calculated) : 50.1 HEPARIN  DW (KG): 66.5  Vital Signs: Temp: 98.9 F (37.2 C) (01/29 0619) Temp Source: Oral (01/29 0619) BP: 116/43 (01/29 0629) Pulse Rate: 71 (01/29 0629)  Labs: Recent Labs    06/20/24 0909 06/20/24 0920 06/20/24 1057 06/20/24 2034 06/20/24 2357 06/21/24 0407 06/21/24 0656  HGB 10.6* 10.9*  --   --   --  9.0*  --   HCT 33.9* 32.0*  --   --   --  28.5*  --   PLT 172  --   --   --   --  138*  --   LABPROT  --   --  26.6*  --  32.7*  --   --   INR  --   --  2.3*  --  3.0*  --   --   HEPARINUNFRC  --   --   --  0.13*  --   --  0.28*  CREATININE 1.26* 1.30*  --   --   --  1.44*  --     Estimated Creatinine Clearance: 30.7 mL/min (A) (by C-G formula based on SCr of 1.44 mg/dL (H)).  Assessment: Abigail Oliver is a 79 y.o. year old female presented on 06/20/2024 with concern for CP and ACS. On warfarin prior to admission for mAVR. Prior to admission regimen, warfarin 1.5mg  STTh and 3mg  all other days (Last dose prior to admission 1/27 afternoon). Pharmacy consulted for heparin  management.   Heparin  level is subtherapeutic at 0.28 on 1000 units/hr. No issues with the infusion or bleeding reported per RN. INR 2.3 > 3 without any warfarin since 1/28 - continue to watch trend.  Goal of Therapy:  INR goal: 2.5-3.5 Heparin  level 0.3-0.7 units/ml Monitor platelets by anticoagulation protocol: Yes   Plan:  Increase heparin  infusion to 1100 units/hr 8h heparin  level and INR Daily heparin  level, CBC, and monitoring for bleeding F/u plans for anticoagulation and Cards recs  Thank you for involving pharmacy in this patient's care.  Delon Sax, PharmD, BCPS Clinical Pharmacist Clinical phone for 06/21/2024 is [phone] 06/21/2024 8:14  AM            [1]  Allergies Allergen Reactions   Atorvastatin      Muscle pain   Gabapentin  Other (See Comments)    Stomach issues   Simvastatin  Other (See Comments)    Muscle pain   Sulfa  Antibiotics     unknown   Farxiga [Dapagliflozin] Nausea Only and Rash   Iodinated Contrast Media Rash     Red rash after cardiac cath 1 wk ago, ? Contrast allergy, requires 13 hr prep now per [email or web address]

## 2024-06-21 NOTE — Assessment & Plan Note (Signed)
this patient has acute respiratory failure with Hypoxia   as documented by the presence of following: O2 saturatio< 90% on RA  Likely due to:  CHF exacerbation, Provide O2 therapy and titrate as needed  Continuous pulse ox   check Pulse ox with ambulation prior to discharge   may need  TC consult for home O2 set up    flutter valve ordered

## 2024-06-21 NOTE — Progress Notes (Signed)
 [facility]  PROGRESS NOTE  Abigail Oliver FMW:[id number] DOB: November 19, 1945 DOA: 06/20/2024 PCP: Abigail Jereld BROCKS, NP   Brief HPI:   79 y.o. female with medical history significant of PAD, anxiety aortic valve replacement on chronic Coumadin , CKD 3B, DM2 HTN, HLD glaucoma, osteoporosis, diastolic CHF, CAD with prior CABG and PCI carotid disease     Presented with   generalized fatigue and chest pain Patient presents with cough and congestion for the past 2 days chills, orthopnea Had significant chest pain noted to have elevated troponin [id number] cardiology has been consulted She was started on heparin  repeat troponin 800 CXR showed pulmonary congestion She does have hx of PAD and CAD and diastolic CHF Cardiology felt her symptoms were due to CHF exacerbation and rec diuresis   Patient states that for the past few weeks she has been progressively getting lightheaded fatigued having trouble walking and then for the past 24 hours has been having more severe chest pain feeling like something is crushing a big weight sitting on her chest her abdomen has been feeling distended and she has been having harder time breathing no significant leg swelling she was noted to be hypoxic in the emergency department down to 83% and started on 3 L FiO2 In the emergency department her symptoms have improved after getting IV Lasix      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 Has been seen by Abigail Oliver in the past for claudication in her right leg she is status post right SFA intervention in 2013 She has significant PAD with right ABI of 0.81 May view in 2025 was nonischemic   She does has history of aortic valve for which she takes Coumadin  and reports has been taking it denies any bleeding no black stools          Assessment/Plan:    NSTEMI (non-ST elevated myocardial infarction) [facility]) Cardiology consulted -Continue IV heparin , -Echocardiogram showed no wall motion  abnormality -Plan for cardiac cath as per cardiology   Peripheral arterial disease Patient followed by Abigail Oliver Patient states that she was told her vascular studies or procedures could be advanced if she was to be hospitalized may need to get this clarified in the morning   Acute on chronic diastolic CHF (congestive heart failure) (HCC) - Pt diagnosed with CHF based on presence of the following:  PND, OA, rales on exam, With noted response to IV diuretic in ER Continue to diurese with IV lasix    40 mg IV BID         Long term (current) use of anticoagulants Currently on heparin  and holding Coumadin  while on heparin    Anxiety Continue Xanax    AORTIC VALVE DISEASE Aortic valve status post replacement on Coumadin  Currently on IV heparin -   CAD, CABG 2000, low risk Myoview  April 2011; 2015 + myoview  Appreciate cardiology involvement continue statin Crestor  20 mg daily continue Toprol  50 mg daily and daily aspirin    CKD stage 3b, GFR 30-44 ml/min (HCC)  -chronic avoid nephrotoxic medications such as NSAIDs, Vanco Zosyn combo,  avoid hypotension, continue to follow renal function -Follow BMP in am   Hypokalemia -Replace potassium and follow BMP in am   Diabetic polyneuropathy associated with type 2 diabetes mellitus (HCC) Continue sliding scale insulin  with NovoLog    Dyslipidemia Continue Crestor  20 mg a day   Essential hypertension Continue Toprol  50 mg daily if BP allows restart Diovan    H/O aortic valve replacement Currently on heparin  Coumadin  on hold  Acute respiratory failure with hypoxia (HCC)  this patient has acute respiratory failure with Hypoxia   as documented by the presence of following: O2 saturatio< 90% on RA  Likely due to:  CHF exacerbation,   Provide O2 therapy and titrate as needed  Continuous pulse ox   check Pulse ox with ambulation prior to discharge   may need  TC consult for home O2 set up    flutter valve ordered     DVT prophylaxis:  Heparin   Medications     aspirin  EC  81 mg Oral Daily   furosemide   40 mg Intravenous Q12H   insulin  aspart  0-9 Units Subcutaneous Q4H   latanoprost   1 drop Both Eyes QHS   metoprolol  succinate  50 mg Oral Daily   pantoprazole   40 mg Oral Daily   rosuvastatin   20 mg Oral QPM   sertraline   150 mg Oral Daily   sodium chloride  flush  3 mL Intravenous Q12H     Data Reviewed:   CBG:  Recent Labs  Lab 06/20/24 2246 06/21/24 0013 06/21/24 0333 06/21/24 0815  GLUCAP 115* 157* 117* 99    SpO2: 92 % O2 Flow Rate (L/min): 2 L/min    Vitals:   06/21/24 0619 06/21/24 0626 06/21/24 0629 06/21/24 0815  BP: (!) 113/38 (!) 109/42 (!) 116/43 (!) 103/40  Pulse: 74 66 71 68  Resp: 20 (!) 22 20 18   Temp: 98.9 F (37.2 C)   98.9 F (37.2 C)  TempSrc: Oral   Oral  SpO2: 97% 96% 99% 92%  Weight:  75.5 kg    Height:  5' 2 (1.575 m)        Data Reviewed:  Basic Metabolic Panel: Recent Labs  Lab 06/20/24 0909 06/20/24 0920 06/20/24 1600 06/21/24 0407  NA 138 141  --  138  K 4.0 4.0  --  3.4*  CL 104 106  --  104  CO2 23  --   --  22  GLUCOSE 150* 148*  --  117*  BUN 25* 25*  --  34*  CREATININE 1.26* 1.30*  --  1.44*  CALCIUM  9.4  --   --  8.4*  MG  --   --  1.8 1.7  PHOS  --   --   --  2.4*    CBC: Recent Labs  Lab 06/20/24 0909 06/20/24 0920 06/21/24 0407  WBC 7.3  --  6.2  NEUTROABS 5.6  --   --   HGB 10.6* 10.9* 9.0*  HCT 33.9* 32.0* 28.5*  MCV 84.5  --  83.6  PLT 172  --  138*    LFT Recent Labs  Lab 06/20/24 0909 06/21/24 0407  AST 53* 58*  ALT 15 14  ALKPHOS 219* 181*  BILITOT 1.6* 1.4*  PROT 7.1 6.3*  ALBUMIN 3.9 3.4*     Antibiotics: Anti-infectives (From admission, onward)    None        CONSULTS cardiology  Code Status: Full code  Family Communication: No family at bedside     Subjective   Breathing has improved.  Denies chest pain   Objective    Physical Examination:   General-appears in no acute  distress Heart-S1-S2, regular, no murmur auscultated Lungs-clear to auscultation bilaterally, no wheezing or crackles auscultated Abdomen-soft, nontender, no organomegaly Extremities-no edema in the lower extremities Neuro-alert, oriented x3, no focal deficit noted           Abigail Oliver   [facility] If  7PM-7AM, please contact night-coverage at [email or web address], Office  [phone]   06/21/2024, 8:51 AM  LOS: 0 days

## 2024-06-21 NOTE — Assessment & Plan Note (Signed)
 Currently on heparin  and holding Coumadin  while on heparin 

## 2024-06-21 NOTE — Assessment & Plan Note (Signed)
 Currently on heparin  Coumadin  on hold

## 2024-06-21 NOTE — Assessment & Plan Note (Signed)
Order sliding scale  

## 2024-06-21 NOTE — Assessment & Plan Note (Signed)
 -  Continue Xanax

## 2024-06-21 NOTE — Assessment & Plan Note (Signed)
 Aortic valve status post replacement on Coumadin 

## 2024-06-22 ENCOUNTER — Ambulatory Visit: Admitting: Physician Assistant

## 2024-06-22 DIAGNOSIS — I5033 Acute on chronic diastolic (congestive) heart failure: Secondary | ICD-10-CM | POA: Diagnosis not present

## 2024-06-22 DIAGNOSIS — I2510 Atherosclerotic heart disease of native coronary artery without angina pectoris: Secondary | ICD-10-CM | POA: Diagnosis not present

## 2024-06-22 DIAGNOSIS — R7989 Other specified abnormal findings of blood chemistry: Secondary | ICD-10-CM | POA: Diagnosis not present

## 2024-06-22 LAB — CBC
HCT: 28.2 % — ABNORMAL LOW (ref 36.0–46.0)
Hemoglobin: 8.8 g/dL — ABNORMAL LOW (ref 12.0–15.0)
MCH: 26.3 pg (ref 26.0–34.0)
MCHC: 31.2 g/dL (ref 30.0–36.0)
MCV: 84.2 fL (ref 80.0–100.0)
Platelets: 124 10*3/uL — ABNORMAL LOW (ref 150–400)
RBC: 3.35 MIL/uL — ABNORMAL LOW (ref 3.87–5.11)
RDW: 17 % — ABNORMAL HIGH (ref 11.5–15.5)
WBC: 5.1 10*3/uL (ref 4.0–10.5)
nRBC: 0 % (ref 0.0–0.2)

## 2024-06-22 LAB — HEPARIN LEVEL (UNFRACTIONATED): Heparin Unfractionated: 0.39 [IU]/mL (ref 0.30–0.70)

## 2024-06-22 LAB — BASIC METABOLIC PANEL WITH GFR
Anion gap: 11 (ref 5–15)
BUN: 34 mg/dL — ABNORMAL HIGH (ref 8–23)
CO2: 23 mmol/L (ref 22–32)
Calcium: 8.4 mg/dL — ABNORMAL LOW (ref 8.9–10.3)
Chloride: 106 mmol/L (ref 98–111)
Creatinine, Ser: 1.39 mg/dL — ABNORMAL HIGH (ref 0.44–1.00)
GFR, Estimated: 39 mL/min — ABNORMAL LOW
Glucose, Bld: 111 mg/dL — ABNORMAL HIGH (ref 70–99)
Potassium: 4.1 mmol/L (ref 3.5–5.1)
Sodium: 140 mmol/L (ref 135–145)

## 2024-06-22 LAB — GLUCOSE, CAPILLARY
Glucose-Capillary: 108 mg/dL — ABNORMAL HIGH (ref 70–99)
Glucose-Capillary: 121 mg/dL — ABNORMAL HIGH (ref 70–99)
Glucose-Capillary: 126 mg/dL — ABNORMAL HIGH (ref 70–99)
Glucose-Capillary: 95 mg/dL (ref 70–99)

## 2024-06-22 LAB — PROTIME-INR
INR: 2.1 — ABNORMAL HIGH (ref 0.8–1.2)
Prothrombin Time: 24.4 s — ABNORMAL HIGH (ref 11.4–15.2)

## 2024-06-22 MED ORDER — GUAIFENESIN-DM 100-10 MG/5ML PO SYRP
5.0000 mL | ORAL_SOLUTION | ORAL | Status: AC | PRN
  Administered 2024-06-22 – 2024-06-24 (×3): 5 mL via ORAL
  Filled 2024-06-22 (×4): qty 5

## 2024-06-22 MED ORDER — ALPRAZOLAM 0.5 MG PO TABS
0.5000 mg | ORAL_TABLET | Freq: Three times a day (TID) | ORAL | Status: AC | PRN
  Administered 2024-06-22 – 2024-06-29 (×10): 0.5 mg via ORAL
  Filled 2024-06-22 (×10): qty 1

## 2024-06-22 NOTE — TOC Progression Note (Signed)
 Transition of Care [facility]) - Progression Note    Patient Details  Name: Abigail Oliver MRN: [id number] Date of Birth: 1945/09/11  [facility] of [facility]) CM/SW Contact  Arlana JINNY Nicholaus [place] Phone Number: [phone] 06/22/2024, 8:54 PM  Clinical Narrative:   CSW placed SDOH resources on the AVS.                       Expected Discharge Plan and Services                                               Social Drivers of Health (SDOH) Interventions SDOH Screenings   Food Insecurity: No Food Insecurity (06/21/2024)  Housing: Unknown (06/21/2024)  Transportation Needs: No Transportation Needs (06/21/2024)  Utilities: Not At Risk (06/21/2024)  Depression (PHQ2-9): Medium Risk (04/02/2024)  Financial Resource Strain: Low Risk (07/22/2023)  Physical Activity: Inactive (07/22/2023)  Social Connections: Socially Isolated (06/21/2024)  Stress: Stress Concern Present (07/22/2023)  Tobacco Use: Low Risk (06/21/2024)    Readmission Risk Interventions     No data to display

## 2024-06-22 NOTE — Progress Notes (Signed)
 [facility]  PROGRESS NOTE  KOLBEE STALLMAN FMW:[id number] DOB: Jan 23, 1946 DOA: 06/20/2024 PCP: [name], NP   Brief HPI:   79 y.o. female with medical history significant of PAD, anxiety aortic valve replacement on chronic Coumadin , CKD 3B, DM2 HTN, HLD glaucoma, osteoporosis, diastolic CHF, CAD with prior CABG and PCI carotid disease     Presented with   generalized fatigue and chest pain Patient presents with cough and congestion for the past 2 days chills, orthopnea Had significant chest pain noted to have elevated troponin 351-700 cardiology has been consulted She was started on heparin  repeat troponin 800 CXR showed pulmonary congestion She does have hx of PAD and CAD and diastolic CHF Cardiology felt her symptoms were due to CHF exacerbation and rec diuresis   Patient states that for the past few weeks she has been progressively getting lightheaded fatigued having trouble walking and then for the past 24 hours has been having more severe chest pain feeling like something is crushing a big weight sitting on her chest her abdomen has been feeling distended and she has been having harder time breathing no significant leg swelling she was noted to be hypoxic in the emergency department down to 83% and started on 3 L FiO2 In the emergency department her symptoms have improved after getting IV Lasix      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 Court in the past for claudication in her right leg she is status post right SFA intervention in 2013 She has significant PAD with right ABI of 0.81 May view in 2025 was nonischemic   She does has history of aortic valve for which she takes Coumadin  and reports has been taking it denies any bleeding no black stools          Assessment/Plan:    NSTEMI (non-ST elevated myocardial infarction) [facility]) Cardiology consulted -Continue IV heparin , -Echocardiogram showed no wall motion  abnormality -Plan for cardiac cath on Monday as per cardiology   Peripheral arterial disease Patient followed by Dr. Court    Acute on chronic diastolic CHF (congestive heart failure) (HCC) - Pt diagnosed with CHF based on presence of the following:  PND, OA, rales on exam, With noted response to IV diuretic in ER Continue to diurese with IV lasix    40 mg IV BID         Long term (current) use of anticoagulants Currently on heparin  and holding Coumadin  while on heparin    Anxiety Continue Xanax    AORTIC VALVE DISEASE Aortic valve status post replacement on Coumadin  Currently on IV heparin -   CAD, CABG 2000, low risk Myoview  April 2011; 2015 + myoview  Appreciate cardiology input  continue statin Crestor  20 mg daily continue Toprol  50 mg daily and daily aspirin    CKD stage 3b, GFR 30-44 ml/min (HCC)  -chronic avoid nephrotoxic medications such as NSAIDs, Vanco Zosyn combo,  avoid hypotension, continue to follow renal function -Follow BMP in am   Hypokalemia -Replace potassium and follow BMP in am   Diabetic polyneuropathy associated with type 2 diabetes mellitus (HCC) Continue sliding scale insulin  with NovoLog    Dyslipidemia Continue Crestor  20 mg a day   Essential hypertension Continue Toprol  50 mg daily if BP allows restart Diovan    H/O aortic valve replacement Currently on heparin  Coumadin  on hold   Acute respiratory failure with hypoxia (HCC)  this patient has acute respiratory failure with Hypoxia   as documented by the presence of following: O2  saturatio< 90% on RA  Likely due to:  CHF exacerbation,   Provide O2 therapy and titrate as needed  Continuous pulse ox   check Pulse ox with ambulation prior to discharge   may need  TC consult for home O2 set up    flutter valve ordered     DVT prophylaxis: Heparin   Medications     aspirin  EC  81 mg Oral Daily   furosemide   40 mg Intravenous Q12H   insulin  aspart  0-9 Units Subcutaneous TID WC    latanoprost   1 drop Both Eyes QHS   metoprolol  succinate  50 mg Oral Daily   pantoprazole   40 mg Oral Daily   rosuvastatin   20 mg Oral QPM   sertraline   150 mg Oral Daily   sodium chloride  flush  3 mL Intravenous Q12H     Data Reviewed:   CBG:  Recent Labs  Lab 06/21/24 1615 06/21/24 2104 06/22/24 0614 06/22/24 0806 06/22/24 1100  GLUCAP 139* 139* 108* 121* 126*    SpO2: 92 % O2 Flow Rate (L/min): 3 L/min    Vitals:   06/22/24 0331 06/22/24 0709 06/22/24 1059 06/22/24 1101  BP: (!) 123/51 115/70 (!) 107/43 (!) 99/59  Pulse:  73 64 67  Resp: 20 20 20 20   Temp: 98.6 F (37 C) 97.8 F (36.6 C) 98.1 F (36.7 C) 98.1 F (36.7 C)  TempSrc: Oral Oral Oral Oral  SpO2:  98% 90% 92%  Weight: 76 kg     Height:          Data Reviewed:  Basic Metabolic Panel: Recent Labs  Lab 06/20/24 0909 06/20/24 0920 06/20/24 1600 06/21/24 0407 06/22/24 0618  NA 138 141  --  138 140  K 4.0 4.0  --  3.4* 4.1  CL 104 106  --  104 106  CO2 23  --   --  22 23  GLUCOSE 150* 148*  --  117* 111*  BUN 25* 25*  --  34* 34*  CREATININE 1.26* 1.30*  --  1.44* 1.39*  CALCIUM  9.4  --   --  8.4* 8.4*  MG  --   --  1.8 1.7  --   PHOS  --   --   --  2.4*  --     CBC: Recent Labs  Lab 06/20/24 0909 06/20/24 0920 06/21/24 0407 06/22/24 0618  WBC 7.3  --  6.2 5.1  NEUTROABS 5.6  --   --   --   HGB 10.6* 10.9* 9.0* 8.8*  HCT 33.9* 32.0* 28.5* 28.2*  MCV 84.5  --  83.6 84.2  PLT 172  --  138* 124*    LFT Recent Labs  Lab 06/20/24 0909 06/21/24 0407  AST 53* 58*  ALT 15 14  ALKPHOS 219* 181*  BILITOT 1.6* 1.4*  PROT 7.1 6.3*  ALBUMIN 3.9 3.4*     Antibiotics: Anti-infectives (From admission, onward)    None        CONSULTS cardiology  Code Status: Full code  Family Communication: No family at bedside     Subjective   Patient seen and examined, denies shortness of breath.   Objective    Physical Examination:  General-appears in no acute  distress Heart-S1-S2, regular, no murmur auscultated Lungs-clear to auscultation bilaterally, no wheezing or crackles auscultated Abdomen-soft, nontender, no organomegaly Extremities-no edema in the lower extremities Neuro-alert, oriented x3, no focal deficit noted            Amaad Byers  S Tamaj Jurgens   [facility] If 7PM-7AM, please contact night-coverage at [email or web address], Office  [phone]   06/22/2024, 3:27 PM  LOS: 1 day

## 2024-06-22 NOTE — Progress Notes (Signed)
"  °  Progress Note  Patient Name: Abigail Oliver Date of Encounter: 06/22/2024 [facility] Cardiologist: Darryle ONEIDA Decent, MD   Interval Summary   INR 2.1 this morning Planning for [facility] on Monday given INR Renal function improving slightly  Good urine output  BP normotensive this AM  Vital Signs Vitals:   06/21/24 1949 06/22/24 0000 06/22/24 0331 06/22/24 0709  BP: (!) 129/49 (!) 129/54 (!) 123/51 115/70  Pulse:    73  Resp: 20 20 20 20   Temp: 98.1 F (36.7 C) 98.4 F (36.9 C) 98.6 F (37 C) 97.8 F (36.6 C)  TempSrc: Oral Oral Oral Oral  SpO2: 96%   98%  Weight:   76 kg   Height:        Intake/Output Summary (Last 24 hours) at 06/22/2024 0917 Last data filed at 06/22/2024 9666 Gross per 24 hour  Intake 814.5 ml  Output 1000 ml  Net -185.5 ml      06/22/2024    3:31 AM 06/21/2024    6:26 AM 06/20/2024   12:00 PM  Last 3 Weights  Weight (lbs) 167 lb 8 oz 166 lb 7.2 oz 163 lb 2.3 oz  Weight (kg) 75.978 kg 75.5 kg 74 kg     Telemetry/ECG  NSR - Personally Reviewed  Physical Exam  GEN: No acute distress.   Neck: No JVD Cardiac: RRR + click Respiratory: Clear to auscultation bilaterally. GI: Soft, nontender, non-distended  MS: No edema  Assessment & Plan   Chest pain CAD s/p CABG RIMA-PAD in 2000, PCI to LAD 2015 Elevated troponin  Echo this admission showed no RWMA and preserved EF  Troponin level 799 ? 862 ? 950 ? 1,096 Reports some chest pain, suspected to be in the setting of CHF, patient overall is poor historian  Currently on IV heparin , last checked INR 2.1, pending INR level prior to cath  Suspect that patient would need R/LHC this admission given elevated troponin so planned for Monday given INR still too elevated  Continue ASA 81 mg daily  Continue statin    Acute on chronic HFpEF Hypertension  Echo this admission: LVEF 55-60%, no RWMA, mild LVH, mild reduced RV systolic function, G2DD, dilated IVC proBNP elevated at 10,760 Started  on IV Lasix  I&O's not documented well, patient reports good UOP Renal function improving today BP 115/70 this AM  Presently on IV Lasix  40 mg BID, continue and monitor response  Currently on Toprol  50 mg daily  Consider adding back home ARB if patient BP can tolerate with IV diuresis, BP low-normal today without any additional antihypertensive therapy    AS s/p mechanical AVR in 2000 Mild to moderate MR Moderate MAC Echo this admission showed normal structure and function of AV prosthesis VTI 1.27cm and mean gradient 11.7 mmHg, mild to moderate MR with moderate MAC INR 3.0 Currently on IV heparin  in place of warfarin  Suspect that patient would need R/LHC this admission given elevated troponin so planned for Monday given INR still too elevated    Hyperlipidemia 01/02/2024: HDL 45; LDL Cholesterol (Calc) 60 8/70/7973: ALT 14  Continue Crestor  20 mg daily     For questions or updates, please contact [facility] Please consult [email or web address] for contact info under    Signed, Waddell DELENA Donath, PA-C   "

## 2024-06-22 NOTE — Discharge Instructions (Signed)

## 2024-06-22 NOTE — Plan of Care (Signed)

## 2024-06-22 NOTE — Progress Notes (Signed)
 PHARMACY - ANTICOAGULATION CONSULT NOTE  Pharmacy Consult for IV heparin  Indication: chest pain/ACS and mAVR  Allergies[1]  Patient Measurements: Height: 5' 2 (157.5 cm) Weight: 76 kg (167 lb 8 oz) IBW/kg (Calculated) : 50.1 HEPARIN  DW (KG): 66.5  Vital Signs: Temp: 97.8 F (36.6 C) (01/30 0709) Temp Source: Oral (01/30 0709) BP: 115/70 (01/30 0709) Pulse Rate: 73 (01/30 0709)  Labs: Recent Labs    06/20/24 0909 06/20/24 0920 06/20/24 1057 06/20/24 2357 06/21/24 0407 06/21/24 0656 06/21/24 1855 06/22/24 0618  HGB 10.6* 10.9*  --   --  9.0*  --   --  8.8*  HCT 33.9* 32.0*  --   --  28.5*  --   --  28.2*  PLT 172  --   --   --  138*  --   --  124*  LABPROT  --   --    < > 32.7*  --   --  27.6* 24.4*  INR  --   --    < > 3.0*  --   --  2.4* 2.1*  HEPARINUNFRC  --   --    < >  --   --  0.28* 0.21* 0.39  CREATININE 1.26* 1.30*  --   --  1.44*  --   --  1.39*   < > = values in this interval not displayed.    Estimated Creatinine Clearance: 31.9 mL/min (A) (by C-G formula based on SCr of 1.39 mg/dL (H)).  Assessment: Abigail Oliver is a 79 y.o. year old female presented on 06/20/2024 with concern for CP and ACS. On warfarin prior to admission for mAVR. Prior to admission regimen, warfarin 1.5mg  STTh and 3mg  all other days (Last dose prior to admission 1/27 afternoon). Pharmacy consulted for heparin  management.   Heparin  level is now therapeutic at 0.39 after increasing rate to 1300 units/hr.  INR is trending down at 2.1 today (off warfarin since 1/28). CBC with slight downward trend - Hgb 8.8, Plts 124.  No issues with the infusion or bleeding reported per RN.   Goal of Therapy:  INR goal: 2.5-3.5 Heparin  level 0.3-0.7 units/ml Monitor platelets by anticoagulation protocol: Yes   Plan:  Continue heparin  infusion to 1300 units/hr Daily heparin  level while on therapy Continue to monitor H&H and platelets F/u plans for cardiac cath and when post-cath for  anticoagulation plans  Harlene Boga, PharmD Please refer to [facility] for [facility] Pharmacy numbers 06/22/2024, 8:33 AM     [1]  Allergies Allergen Reactions   Atorvastatin      Muscle pain   Gabapentin  Other (See Comments)    Stomach issues   Simvastatin  Other (See Comments)    Muscle pain   Sulfa  Antibiotics     unknown   Farxiga [Dapagliflozin] Nausea Only and Rash   Iodinated Contrast Media Rash     Red rash after cardiac cath 1 wk ago, ? Contrast allergy, requires 13 hr prep now per [email or web address]

## 2024-06-23 ENCOUNTER — Inpatient Hospital Stay (HOSPITAL_COMMUNITY)

## 2024-06-23 DIAGNOSIS — I2510 Atherosclerotic heart disease of native coronary artery without angina pectoris: Secondary | ICD-10-CM | POA: Diagnosis not present

## 2024-06-23 DIAGNOSIS — I5033 Acute on chronic diastolic (congestive) heart failure: Secondary | ICD-10-CM | POA: Diagnosis not present

## 2024-06-23 DIAGNOSIS — R7989 Other specified abnormal findings of blood chemistry: Secondary | ICD-10-CM | POA: Diagnosis not present

## 2024-06-23 LAB — BASIC METABOLIC PANEL WITH GFR
Anion gap: 10 (ref 5–15)
BUN: 34 mg/dL — ABNORMAL HIGH (ref 8–23)
CO2: 24 mmol/L (ref 22–32)
Calcium: 8.5 mg/dL — ABNORMAL LOW (ref 8.9–10.3)
Chloride: 107 mmol/L (ref 98–111)
Creatinine, Ser: 1.39 mg/dL — ABNORMAL HIGH (ref 0.44–1.00)
GFR, Estimated: 39 mL/min — ABNORMAL LOW
Glucose, Bld: 109 mg/dL — ABNORMAL HIGH (ref 70–99)
Potassium: 4 mmol/L (ref 3.5–5.1)
Sodium: 141 mmol/L (ref 135–145)

## 2024-06-23 LAB — CBC
HCT: 28.2 % — ABNORMAL LOW (ref 36.0–46.0)
Hemoglobin: 8.6 g/dL — ABNORMAL LOW (ref 12.0–15.0)
MCH: 26 pg (ref 26.0–34.0)
MCHC: 30.5 g/dL (ref 30.0–36.0)
MCV: 85.2 fL (ref 80.0–100.0)
Platelets: 127 10*3/uL — ABNORMAL LOW (ref 150–400)
RBC: 3.31 MIL/uL — ABNORMAL LOW (ref 3.87–5.11)
RDW: 17.2 % — ABNORMAL HIGH (ref 11.5–15.5)
WBC: 5.1 10*3/uL (ref 4.0–10.5)
nRBC: 0 % (ref 0.0–0.2)

## 2024-06-23 LAB — PROTIME-INR
INR: 2 — ABNORMAL HIGH (ref 0.8–1.2)
Prothrombin Time: 23.8 s — ABNORMAL HIGH (ref 11.4–15.2)

## 2024-06-23 LAB — GLUCOSE, CAPILLARY
Glucose-Capillary: 100 mg/dL — ABNORMAL HIGH (ref 70–99)
Glucose-Capillary: 149 mg/dL — ABNORMAL HIGH (ref 70–99)
Glucose-Capillary: 95 mg/dL (ref 70–99)
Glucose-Capillary: 121 mg/dL — ABNORMAL HIGH (ref 70–99)

## 2024-06-23 LAB — HEPARIN LEVEL (UNFRACTIONATED): Heparin Unfractionated: 0.38 [IU]/mL (ref 0.30–0.70)

## 2024-06-23 MED ORDER — MIDODRINE HCL 5 MG PO TABS
10.0000 mg | ORAL_TABLET | Freq: Once | ORAL | Status: AC
  Administered 2024-06-23: 10 mg via ORAL
  Filled 2024-06-23: qty 2

## 2024-06-23 NOTE — Progress Notes (Signed)
 [facility]  PROGRESS NOTE  Abigail Oliver FMW:[id number] DOB: Aug 31, 1945 DOA: 06/20/2024 PCP: [name], NP   Brief HPI:   79 y.o. female with medical history significant of PAD, anxiety aortic valve replacement on chronic Coumadin , CKD 3B, DM2 HTN, HLD glaucoma, osteoporosis, diastolic CHF, CAD with prior CABG and PCI carotid disease     Presented with   generalized fatigue and chest pain Patient presents with cough and congestion for the past 2 days chills, orthopnea Had significant chest pain noted to have elevated troponin [id number] cardiology has been consulted She was started on heparin  repeat troponin 800 CXR showed pulmonary congestion She does have hx of PAD and CAD and diastolic CHF Cardiology felt her symptoms were due to CHF exacerbation and rec diuresis   Patient states that for the past few weeks she has been progressively getting lightheaded fatigued having trouble walking and then for the past 24 hours has been having more severe chest pain feeling like something is crushing a big weight sitting on her chest her abdomen has been feeling distended and she has been having harder time breathing no significant leg swelling she was noted to be hypoxic in the emergency department down to 83% and started on 3 L FiO2 In the emergency department her symptoms have improved after getting IV Lasix      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 Court in the past for claudication in her right leg she is status post right SFA intervention in 2013 She has significant PAD with right ABI of 0.81 May view in 2025 was nonischemic   She does has history of aortic valve for which she takes Coumadin  and reports has been taking it denies any bleeding no black stools          Assessment/Plan:    NSTEMI (non-ST elevated myocardial infarction) [facility]) Cardiology consulted -Continue IV heparin , -Echocardiogram showed no wall motion  abnormality -Plan for cardiac cath on Monday as per cardiology   Peripheral arterial disease Patient followed by Dr. Court    Acute on chronic diastolic CHF (congestive heart failure) (HCC) - Pt diagnosed with CHF based on presence of the following:  PND, OA, rales on exam, With noted response to IV diuretic in ER Continue to diurese with IV lasix    40 mg IV BID         Long term (current) use of anticoagulants Currently on heparin  and holding Coumadin  while on heparin    Anxiety Continue Xanax    AORTIC VALVE DISEASE Aortic valve status post replacement on Coumadin  Currently on IV heparin -   CAD, CABG 2000, low risk Myoview  April 2011; 2015 + myoview  Appreciate cardiology input  continue statin Crestor  20 mg daily continue Toprol  50 mg daily and daily aspirin    CKD stage 3b, GFR 30-44 ml/min (HCC)  -chronic avoid nephrotoxic medications such as NSAIDs, Vanco Zosyn combo,  avoid hypotension, continue to follow renal function -Creatinine stable at 1.39 -Follow BMP in am   Hypokalemia -Replace potassium and follow BMP in am   Diabetic polyneuropathy associated with type 2 diabetes mellitus (HCC) Continue sliding scale insulin  with NovoLog    Dyslipidemia Continue Crestor  20 mg a day   Essential hypertension Continue Toprol  50 mg daily if BP allows restart Diovan    H/O aortic valve replacement Currently on heparin  Coumadin  on hold   Acute respiratory failure with hypoxia (HCC)  this patient has acute respiratory failure with Hypoxia   as documented by the  presence of following: O2 saturatio< 90% on RA  Likely due to:  CHF exacerbation,   Provide O2 therapy and titrate as needed  Continuous pulse ox   check Pulse ox with ambulation prior to discharge   may need  TC consult for home O2 set up  flutter valve ordered     DVT prophylaxis: Heparin   Medications     aspirin  EC  81 mg Oral Daily   furosemide   40 mg Intravenous Q12H   insulin  aspart  0-9 Units  Subcutaneous TID WC   latanoprost   1 drop Both Eyes QHS   metoprolol  succinate  50 mg Oral Daily   pantoprazole   40 mg Oral Daily   rosuvastatin   20 mg Oral QPM   sertraline   150 mg Oral Daily   sodium chloride  flush  3 mL Intravenous Q12H     Data Reviewed:   CBG:  Recent Labs  Lab 06/22/24 0614 06/22/24 0806 06/22/24 1100 06/22/24 1620 06/23/24 0605  GLUCAP 108* 121* 126* 95 100*    SpO2: 92 % O2 Flow Rate (L/min): 3 L/min    Vitals:   06/22/24 2313 06/23/24 0410 06/23/24 0500 06/23/24 0815  BP: (!) 121/47 (!) 102/34 (!) 126/47 114/63  Pulse: 70 (!) 59  64  Resp: 19 18  19   Temp: 98 F (36.7 C) 98 F (36.7 C)  98.5 F (36.9 C)  TempSrc: Oral Oral  Oral  SpO2: 93% 97%  92%  Weight:  74.9 kg    Height:          Data Reviewed:  Basic Metabolic Panel: Recent Labs  Lab 06/20/24 0909 06/20/24 0920 06/20/24 1600 06/21/24 0407 06/22/24 0618 06/23/24 0216  NA 138 141  --  138 140 141  K 4.0 4.0  --  3.4* 4.1 4.0  CL 104 106  --  104 106 107  CO2 23  --   --  22 23 24   GLUCOSE 150* 148*  --  117* 111* 109*  BUN 25* 25*  --  34* 34* 34*  CREATININE 1.26* 1.30*  --  1.44* 1.39* 1.39*  CALCIUM  9.4  --   --  8.4* 8.4* 8.5*  MG  --   --  1.8 1.7  --   --   PHOS  --   --   --  2.4*  --   --     CBC: Recent Labs  Lab 06/20/24 0909 06/20/24 0920 06/21/24 0407 06/22/24 0618 06/23/24 0216  WBC 7.3  --  6.2 5.1 5.1  NEUTROABS 5.6  --   --   --   --   HGB 10.6* 10.9* 9.0* 8.8* 8.6*  HCT 33.9* 32.0* 28.5* 28.2* 28.2*  MCV 84.5  --  83.6 84.2 85.2  PLT 172  --  138* 124* 127*    LFT Recent Labs  Lab 06/20/24 0909 06/21/24 0407  AST 53* 58*  ALT 15 14  ALKPHOS 219* 181*  BILITOT 1.6* 1.4*  PROT 7.1 6.3*  ALBUMIN 3.9 3.4*     Antibiotics: Anti-infectives (From admission, onward)    None        CONSULTS cardiology  Code Status: Full code  Family Communication: No family at bedside     Subjective   Patient seen and examined,  denies chest pain or shortness of breath.   Objective    Physical Examination:  General-appears in no acute distress Heart-S1-S2, regular, no murmur auscultated Lungs-clear to auscultation bilaterally, no wheezing or crackles auscultated  Abdomen-soft, nontender, no organomegaly Extremities-no edema in the lower extremities Neuro-alert, oriented x3, no focal deficit noted            Zorianna Taliaferro S [place] Teters   [facility] If 7PM-7AM, please contact night-coverage at [email or web address], Office  [phone]   06/23/2024, 10:07 AM  LOS: 2 days

## 2024-06-23 NOTE — Progress Notes (Signed)
 PHARMACY - ANTICOAGULATION CONSULT NOTE  Pharmacy Consult for IV heparin  Indication: chest pain/ACS and mAVR  Allergies[1]  Patient Measurements: Height: 5' 2 (157.5 cm) Weight: 74.9 kg (165 lb 2 oz) IBW/kg (Calculated) : 50.1 HEPARIN  DW (KG): 66.5  Vital Signs: Temp: 98.5 F (36.9 C) (01/31 0815) Temp Source: Oral (01/31 0815) BP: 114/63 (01/31 0815) Pulse Rate: 64 (01/31 0815)  Labs: Recent Labs    06/21/24 0407 06/21/24 9343 06/21/24 1855 06/22/24 0618 06/23/24 0216  HGB 9.0*  --   --  8.8* 8.6*  HCT 28.5*  --   --  28.2* 28.2*  PLT 138*  --   --  124* 127*  LABPROT  --   --  27.6* 24.4* 23.8*  INR  --   --  2.4* 2.1* 2.0*  HEPARINUNFRC  --    < > 0.21* 0.39 0.38  CREATININE 1.44*  --   --  1.39* 1.39*   < > = values in this interval not displayed.    Estimated Creatinine Clearance: 31.6 mL/min (A) (by C-G formula based on SCr of 1.39 mg/dL (H)).  Assessment: ELYSABETH Oliver is a 79 y.o. year old female presented on 06/20/2024 with concern for CP and ACS. On warfarin prior to admission for mAVR. Prior to admission regimen, warfarin 1.5mg  STTh and 3mg  all other days (Last dose prior to admission 1/27 afternoon). Pharmacy consulted for heparin  management.   Heparin  level remains therapeutic at 0.38 on current infusion rate of 1300 units/hr INR is trending down at 2 today (off warfarin since 1/28). CBC with slight downward trend - Hgb 8.6, Plts 127 No issues with the infusion or signs/symptoms of bleeding reported per RN   Goal of Therapy:  INR goal: 2.5-3.5 Heparin  level 0.3-0.7 units/ml Monitor platelets by anticoagulation protocol: Yes   Plan:  Continue heparin  infusion to 1300 units/hr Daily heparin  level while on therapy Continue to monitor H&H and platelets F/u plans for cardiac cath and when post-cath for anticoagulation plans  Feliciano Close, PharmD PGY2 Infectious Diseases Pharmacy Resident     [1]  Allergies Allergen Reactions    Atorvastatin      Muscle pain   Gabapentin  Other (See Comments)    Stomach issues   Simvastatin  Other (See Comments)    Muscle pain   Sulfa  Antibiotics     unknown   Farxiga [Dapagliflozin] Nausea Only and Rash   Iodinated Contrast Media Rash     Red rash after cardiac cath 1 wk ago, ? Contrast allergy, requires 13 hr prep now per [email or web address]

## 2024-06-23 NOTE — Progress Notes (Signed)
 "  Progress Note  Patient Name: Abigail Oliver Date of Encounter: 06/23/2024  Primary Cardiologist: Abigail ONEIDA Decent, MD   Subjective   Patient seen and examined at her bedside. She was sitting up in netting.  She offer no complaints at this time she is just anxious for her procedure on Monday  Inpatient Medications    Scheduled Meds:  aspirin  EC  81 mg Oral Daily   furosemide   40 mg Intravenous Q12H   insulin  aspart  0-9 Units Subcutaneous TID WC   latanoprost   1 drop Both Eyes QHS   metoprolol  succinate  50 mg Oral Daily   pantoprazole   40 mg Oral Daily   rosuvastatin   20 mg Oral QPM   sertraline   150 mg Oral Daily   sodium chloride  flush  3 mL Intravenous Q12H   Continuous Infusions:  heparin  1,300 Units/hr (06/23/24 0338)   PRN Meds: acetaminophen  **OR** acetaminophen , ALPRAZolam , guaiFENesin -dextromethorphan , HYDROcodone -acetaminophen , ondansetron  **OR** ondansetron  (ZOFRAN ) IV, sodium chloride  flush, traZODone    Vital Signs    Vitals:   06/22/24 2313 06/23/24 0410 06/23/24 0500 06/23/24 0815  BP: (!) 121/47 (!) 102/34 (!) 126/47 114/63  Pulse: 70 (!) 59  64  Resp: 19 18  19   Temp: 98 F (36.7 C) 98 F (36.7 C)  98.5 F (36.9 C)  TempSrc: Oral Oral  Oral  SpO2: 93% 97%  92%  Weight:  74.9 kg    Height:        Intake/Output Summary (Last 24 hours) at 06/23/2024 1156 Last data filed at 06/23/2024 0800 Gross per 24 hour  Intake 361 ml  Output 900 ml  Net -539 ml   Filed Weights   06/21/24 0626 06/22/24 0331 06/23/24 0410  Weight: 75.5 kg 76 kg 74.9 kg    Telemetry    Sinus rhythm- Personally Reviewed  ECG     - Personally Reviewed  Physical Exam    General: Comfortable, sitting up in bed Head: Atraumatic, normal size  Eyes: PEERLA, EOMI  Neck: Supple, normal JVD Cardiac: Normal S1, S2; RRR; no murmurs, rubs, or gallops Lungs: Clear to auscultation bilaterally Abd: Soft, nontender, no hepatomegaly  Ext: warm, no edema Musculoskeletal: No  deformities, BUE and BLE strength normal and equal   Labs    Chemistry Recent Labs  Lab 06/20/24 0909 06/20/24 0920 06/21/24 0407 06/22/24 0618 06/23/24 0216  NA 138   < > 138 140 141  K 4.0   < > 3.4* 4.1 4.0  CL 104   < > 104 106 107  CO2 23  --  22 23 24   GLUCOSE 150*   < > 117* 111* 109*  BUN 25*   < > 34* 34* 34*  CREATININE 1.26*   < > 1.44* 1.39* 1.39*  CALCIUM  9.4  --  8.4* 8.4* 8.5*  PROT 7.1  --  6.3*  --   --   ALBUMIN 3.9  --  3.4*  --   --   AST 53*  --  58*  --   --   ALT 15  --  14  --   --   ALKPHOS 219*  --  181*  --   --   BILITOT 1.6*  --  1.4*  --   --   GFRNONAA 43*  --  37* 39* 39*  ANIONGAP 11  --  12 11 10    < > = values in this interval not displayed.     Hematology Recent Labs  Lab  06/21/24 0407 06/22/24 0618 06/23/24 0216  WBC 6.2 5.1 5.1  RBC 3.41* 3.35* 3.31*  HGB 9.0* 8.8* 8.6*  HCT 28.5* 28.2* 28.2*  MCV 83.6 84.2 85.2  MCH 26.4 26.3 26.0  MCHC 31.6 31.2 30.5  RDW 16.9* 17.0* 17.2*  PLT 138* 124* 127*    Cardiac EnzymesNo results for input(s): TROPONINI in the last 168 hours. No results for input(s): TROPIPOC in the last 168 hours.   BNP Recent Labs  Lab 06/20/24 0909  PROBNP 10,760.0*     DDimer No results for input(s): DDIMER in the last 168 hours.   Radiology    No results found.  Cardiac Studies   Echo   Patient Profile     79 y.o. female with admitted for NSTEMI  Assessment & Plan    NSTEMI Coronary artery disease CABG in 2020 Peripheral artery disease Acute on chronic heart failure preserved ejection fraction Status post aortic valve replacement Chronic kidney disease Hyperlipidemia  Her presenting symptoms have improved significantly since being on heparin .  I do agree with her NSTEMI and symptoms and her known history of coronary artery disease and low threshold for progression her right and left heart catheterization is the most appropriate step here.  I spoke to the patient again about  this she is agreeable to proceed.  She understands that the procedure is planned for Monday, June 25, 2024.  Informed Consent   Shared Decision Making/Informed Consent The risks [stroke (1 in 1000), death (1 in 1000), kidney failure [usually temporary] (1 in 500), bleeding (1 in 200), allergic reaction [possibly serious] (1 in 200)], benefits (diagnostic support and management of coronary artery disease) and alternatives of a cardiac catheterization were discussed in detail with Abigail Oliver and she is willing to proceed.  Please continue her heparin  drip along with her aspirin  and statin.. With her mechanical valve we have to be delicate with transitioning the patient back to her Coumadin , therefore she will benefit from bridging with either unfractionated heparin  or low molecular weight heparin . She would still benefit from IV diuretics please continue this for now. We will continue to follow with you.      For questions or updates, please contact [facility] Please consult [email or web address] for contact info under Cardiology/STEMI.      Signed, Abigail Daubert, DO  06/23/2024, 11:56 AM    "

## 2024-06-23 NOTE — Progress Notes (Signed)
 Mobility Specialist: Progress Note   06/23/24 1200  Mobility  Activity Ambulated with assistance  Level of Assistance Contact guard assist, steadying assist  Assistive Device Four point cane  Distance Ambulated (ft) 100 ft  Activity Response Tolerated well  Mobility Referral Yes  Mobility visit 1 Mobility  Mobility Specialist Start Time (ACUTE ONLY) [id number]  Mobility Specialist Stop Time (ACUTE ONLY) 0933  Mobility Specialist Time Calculation (min) (ACUTE ONLY) 10 min    Pt received in bed, agreeable to mobility session. MinG throughout ambulation to steady. SpO2 92-94% on 2LO2. Feeling winded and fatigued by EOS but tolerated well. Returned to bed. Left on EOB with all needs met, call bell in reach.   Ileana Lute Mobility Specialist Please contact via SecureChat or Rehab office at [phone]

## 2024-06-24 DIAGNOSIS — I2510 Atherosclerotic heart disease of native coronary artery without angina pectoris: Secondary | ICD-10-CM | POA: Diagnosis not present

## 2024-06-24 DIAGNOSIS — I5033 Acute on chronic diastolic (congestive) heart failure: Secondary | ICD-10-CM | POA: Diagnosis not present

## 2024-06-24 DIAGNOSIS — R7989 Other specified abnormal findings of blood chemistry: Secondary | ICD-10-CM | POA: Diagnosis not present

## 2024-06-24 LAB — PROTIME-INR
INR: 2 — ABNORMAL HIGH (ref 0.8–1.2)
Prothrombin Time: 23.3 s — ABNORMAL HIGH (ref 11.4–15.2)

## 2024-06-24 LAB — GLUCOSE, CAPILLARY
Glucose-Capillary: 113 mg/dL — ABNORMAL HIGH (ref 70–99)
Glucose-Capillary: 138 mg/dL — ABNORMAL HIGH (ref 70–99)
Glucose-Capillary: 115 mg/dL — ABNORMAL HIGH (ref 70–99)

## 2024-06-24 LAB — CBC
HCT: 28 % — ABNORMAL LOW (ref 36.0–46.0)
Hemoglobin: 8.5 g/dL — ABNORMAL LOW (ref 12.0–15.0)
MCH: 26.1 pg (ref 26.0–34.0)
MCHC: 30.4 g/dL (ref 30.0–36.0)
MCV: 85.9 fL (ref 80.0–100.0)
Platelets: 150 10*3/uL (ref 150–400)
RBC: 3.26 MIL/uL — ABNORMAL LOW (ref 3.87–5.11)
RDW: 17.4 % — ABNORMAL HIGH (ref 11.5–15.5)
WBC: 5.7 10*3/uL (ref 4.0–10.5)
nRBC: 0 % (ref 0.0–0.2)

## 2024-06-24 LAB — HEPARIN LEVEL (UNFRACTIONATED): Heparin Unfractionated: 0.36 [IU]/mL (ref 0.30–0.70)

## 2024-06-24 MED ORDER — SODIUM CHLORIDE 0.9 % WEIGHT BASED INFUSION
3.0000 mL/kg/h | INTRAVENOUS | Status: AC
  Administered 2024-06-25: 3 mL/kg/h via INTRAVENOUS

## 2024-06-24 MED ORDER — SODIUM CHLORIDE 0.9 % WEIGHT BASED INFUSION
1.0000 mL/kg/h | INTRAVENOUS | Status: AC
  Administered 2024-06-25 (×2): 1 mL/kg/h via INTRAVENOUS

## 2024-06-24 MED ORDER — ASPIRIN 81 MG PO CHEW
81.0000 mg | CHEWABLE_TABLET | ORAL | Status: AC
  Administered 2024-06-25: 81 mg via ORAL
  Filled 2024-06-24: qty 1

## 2024-06-24 NOTE — Progress Notes (Signed)
 "  Progress Note  Patient Name: Abigail Oliver Date of Encounter: 06/24/2024  Primary Cardiologist: Abigail ONEIDA Decent, MD   Subjective   Patient seen and examined at her bedside.  Still short of breath and  anxious for her procedure on Monday  Inpatient Medications    Scheduled Meds:  aspirin  EC  81 mg Oral Daily   furosemide   40 mg Intravenous Q12H   insulin  aspart  0-9 Units Subcutaneous TID WC   latanoprost   1 drop Both Eyes QHS   metoprolol  succinate  50 mg Oral Daily   pantoprazole   40 mg Oral Daily   rosuvastatin   20 mg Oral QPM   sertraline   150 mg Oral Daily   sodium chloride  flush  3 mL Intravenous Q12H   Continuous Infusions:  heparin  1,300 Units/hr (06/23/24 2310)   PRN Meds: acetaminophen  **OR** acetaminophen , ALPRAZolam , guaiFENesin -dextromethorphan , HYDROcodone -acetaminophen , ondansetron  **OR** ondansetron  (ZOFRAN ) IV, sodium chloride  flush, traZODone    Vital Signs    Vitals:   06/24/24 0041 06/24/24 0500 06/24/24 0539 06/24/24 0914  BP: (!) 117/41  (!) 100/33 (!) 110/45  Pulse: 70  88 68  Resp: 18  18   Temp: 98 F (36.7 C)  97.9 F (36.6 C)   TempSrc: Oral  Oral   SpO2: 96%   94%  Weight: 75.2 kg 75.2 kg    Height:        Intake/Output Summary (Last 24 hours) at 06/24/2024 1123 Last data filed at 06/23/2024 2312 Gross per 24 hour  Intake 618.65 ml  Output 1150 ml  Net -531.35 ml   Filed Weights   06/23/24 0410 06/24/24 0041 06/24/24 0500  Weight: 74.9 kg 75.2 kg 75.2 kg    Telemetry    Sinus rhythm- Personally Reviewed  ECG     - Personally Reviewed  Physical Exam    General: Comfortable, sitting up in bed Head: Atraumatic, normal size  Eyes: PEERLA, EOMI  Neck: Supple, normal JVD Cardiac: Normal S1, S2; RRR; no murmurs, rubs, or gallops Lungs: Clear to auscultation bilaterally Abd: Soft, nontender, no hepatomegaly  Ext: warm, no edema Musculoskeletal: No deformities, BUE and BLE strength normal and equal   Labs     Chemistry Recent Labs  Lab 06/20/24 0909 06/20/24 0920 06/21/24 0407 06/22/24 0618 06/23/24 0216  NA 138   < > 138 140 141  K 4.0   < > 3.4* 4.1 4.0  CL 104   < > 104 106 107  CO2 23  --  22 23 24   GLUCOSE 150*   < > 117* 111* 109*  BUN 25*   < > 34* 34* 34*  CREATININE 1.26*   < > 1.44* 1.39* 1.39*  CALCIUM  9.4  --  8.4* 8.4* 8.5*  PROT 7.1  --  6.3*  --   --   ALBUMIN 3.9  --  3.4*  --   --   AST 53*  --  58*  --   --   ALT 15  --  14  --   --   ALKPHOS 219*  --  181*  --   --   BILITOT 1.6*  --  1.4*  --   --   GFRNONAA 43*  --  37* 39* 39*  ANIONGAP 11  --  12 11 10    < > = values in this interval not displayed.     Hematology Recent Labs  Lab 06/22/24 0618 06/23/24 0216 06/24/24 0125  WBC 5.1 5.1 5.7  RBC  3.35* 3.31* 3.26*  HGB 8.8* 8.6* 8.5*  HCT 28.2* 28.2* 28.0*  MCV 84.2 85.2 85.9  MCH 26.3 26.0 26.1  MCHC 31.2 30.5 30.4  RDW 17.0* 17.2* 17.4*  PLT 124* 127* 150    Cardiac EnzymesNo results for input(s): TROPONINI in the last 168 hours. No results for input(s): TROPIPOC in the last 168 hours.   BNP Recent Labs  Lab 06/20/24 0909  PROBNP 10,760.0*     DDimer No results for input(s): DDIMER in the last 168 hours.   Radiology    DG CHEST PORT 1 VIEW Result Date: 06/23/2024 EXAM: 1 VIEW(S) XRAY OF THE CHEST 06/23/2024 07:49:00 AM COMPARISON: 06/20/2024 CLINICAL HISTORY: Dyspnea. FINDINGS: LUNGS AND PLEURA: Mild pulmonary edema with diffuse interstitial opacities, similar to prior exam. No pleural effusion. No pneumothorax. HEART AND MEDIASTINUM: Stable cardiomegaly. Aortic atherosclerosis. Prior aortic valve replacement. BONES AND SOFT TISSUES: Prior median sternotomy. Right upper quadrant surgical clips. No acute osseous abnormality. IMPRESSION: 1. Mild pulmonary edema with diffuse interstitial opacities, similar to prior exam. 2. Stable cardiomegaly. Electronically signed by: Waddell Calk MD 06/23/2024 01:31 PM EST RP Workstation: [id number]     Cardiac Studies   Echo   Patient Profile     79 y.o. female with admitted for NSTEMI  Assessment & Plan    NSTEMI Coronary artery disease CABG in 2020 Peripheral artery disease Acute on chronic heart failure preserved ejection fraction Status post aortic valve replacement Chronic kidney disease Hyperlipidemia  Her presenting symptoms have improved significantly since being on heparin  but still with some shortness of breath. Will continue the heparin  gtt for now. Plan for cardiac cath tomorrow.    Informed Consent   Shared Decision Making/Informed Consent The risks [stroke (1 in 1000), death (1 in 1000), kidney failure [usually temporary] (1 in 500), bleeding (1 in 200), allergic reaction [possibly serious] (1 in 200)], benefits (diagnostic support and management of coronary artery disease) and alternatives of a cardiac catheterization were discussed in detail with Ms. Abigail Oliver and she is willing to proceed.  Please continue her heparin  drip along with her aspirin  and statin..  With her mechanical valve we have to be delicate with transitioning the patient back to her Coumadin , therefore she will benefit from bridging with either unfractionated heparin  or low molecular weight heparin .  She would still benefit from IV diuretics please continue this for now. We will continue to follow with you.      For questions or updates, please contact [facility] Please consult [email or web address] for contact info under Cardiology/STEMI.      Signed, Yamina Lenis, DO  06/24/2024, 11:23 AM    "

## 2024-06-24 NOTE — Plan of Care (Signed)
" °  Problem: Coping: Goal: Ability to adjust to condition or change in health will improve Outcome: Not Progressing   Problem: Fluid Volume: Goal: Ability to maintain a balanced intake and output will improve Outcome: Not Progressing   Problem: Health Behavior/Discharge Planning: Goal: Ability to identify and utilize available resources and services will improve Outcome: Not Progressing   Problem: Health Behavior/Discharge Planning: Goal: Ability to identify and utilize available resources and services will improve Outcome: Not Progressing   "

## 2024-06-24 NOTE — Progress Notes (Signed)
 PHARMACY - ANTICOAGULATION CONSULT NOTE  Pharmacy Consult for IV heparin  Indication: chest pain/ACS and mAVR  Allergies[1]  Patient Measurements: Height: 5' 2 (157.5 cm) Weight: 75.2 kg (165 lb 12.6 oz) IBW/kg (Calculated) : 50.1 HEPARIN  DW (KG): 66.5  Vital Signs: Temp: 97.9 F (36.6 C) (02/01 0539) Temp Source: Oral (02/01 0539) BP: 100/33 (02/01 0539) Pulse Rate: 88 (02/01 0539)  Labs: Recent Labs    06/22/24 0618 06/23/24 0216 06/24/24 0125  HGB 8.8* 8.6* 8.5*  HCT 28.2* 28.2* 28.0*  PLT 124* 127* 150  LABPROT 24.4* 23.8* 23.3*  INR 2.1* 2.0* 2.0*  HEPARINUNFRC 0.39 0.38 0.36  CREATININE 1.39* 1.39*  --     Estimated Creatinine Clearance: 31.6 mL/min (A) (by C-G formula based on SCr of 1.39 mg/dL (H)).  Assessment: Abigail Oliver is a 79 y.o. year old female presented on 06/20/2024 with concern for CP and ACS. On warfarin prior to admission for mAVR. Prior to admission regimen, warfarin 1.5mg  STTh and 3mg  all other days (Last dose prior to admission 1/27 afternoon). Pharmacy consulted for heparin  management.   Heparin  level remains therapeutic at 0.36 on current infusion rate of 1300 units/hr INR remains stable at 2.0 (off warfarin since 1/28). CBC appears numerically stable - Hgb 8.5, Plts 150 No issues with the infusion or signs/symptoms of bleeding reported per RN   Goal of Therapy:  INR goal: 2.5-3.5 Heparin  level 0.3-0.7 units/ml Monitor platelets by anticoagulation protocol: Yes   Plan:  Continue heparin  infusion to 1300 units/hr Daily heparin  level while on therapy Continue to monitor H&H and platelets F/u plans for cardiac cath (tentatively scheduled 06/25/24) and when post-cath for anticoagulation plans  Feliciano Close, PharmD PGY2 Infectious Diseases Pharmacy Resident      [1]  Allergies Allergen Reactions   Atorvastatin      Muscle pain   Gabapentin  Other (See Comments)    Stomach issues   Simvastatin  Other (See Comments)    Muscle  pain   Sulfa  Antibiotics     unknown   Farxiga [Dapagliflozin] Nausea Only and Rash   Iodinated Contrast Media Rash     Red rash after cardiac cath 1 wk ago, ? Contrast allergy, requires 13 hr prep now per [email or web address]

## 2024-06-24 NOTE — Progress Notes (Signed)
 [facility]  PROGRESS NOTE  Abigail Oliver FMW:[id number] DOB: Sep 29, 1945 DOA: 06/20/2024 PCP: Abigail Jereld BROCKS, NP   Brief HPI:   79 y.o. female with medical history significant of PAD, anxiety aortic valve replacement on chronic Coumadin , CKD 3B, DM2 HTN, HLD glaucoma, osteoporosis, diastolic CHF, CAD with prior CABG and PCI carotid disease     Presented with   generalized fatigue and chest pain Patient presents with cough and congestion for the past 2 days chills, orthopnea Had significant chest pain noted to have elevated troponin [id number] cardiology has been consulted She was started on heparin  repeat troponin 800 CXR showed pulmonary congestion She does have hx of PAD and CAD and diastolic CHF Cardiology felt her symptoms were due to CHF exacerbation and rec diuresis   Patient states that for the past few weeks she has been progressively getting lightheaded fatigued having trouble walking and then for the past 24 hours has been having more severe chest pain feeling like something is crushing a big weight sitting on her chest her abdomen has been feeling distended and she has been having harder time breathing no significant leg swelling she was noted to be hypoxic in the emergency department down to 83% and started on 3 L FiO2 In the emergency department her symptoms have improved after getting IV Lasix      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 Has been seen by Abigail Oliver in the past for claudication in her right leg she is status post right SFA intervention in 2013 She has significant PAD with right ABI of 0.81 May view in 2025 was nonischemic   She does has history of aortic valve for which she takes Coumadin  and reports has been taking it denies any bleeding no black stools          Assessment/Plan:    NSTEMI (non-ST elevated myocardial infarction) [facility]) Cardiology consulted -Continue IV heparin , -Echocardiogram showed no wall motion  abnormality -Plan for cardiac cath on Monday as per cardiology   Peripheral arterial disease Patient followed by Abigail Oliver    Acute on chronic diastolic CHF (congestive heart failure) (HCC) - Pt diagnosed with CHF based on presence of the following:  PND, OA, rales on exam, With noted response to IV diuretic in ER Continue to diurese with IV lasix    40 mg IV BID         Long term (current) use of anticoagulants Currently on heparin  and holding Coumadin  while on heparin    Anxiety Continue Xanax    AORTIC VALVE DISEASE Aortic valve status post replacement on Coumadin  Currently on IV heparin -   CAD, CABG 2000, low risk Myoview  April 2011; 2015 + myoview  Appreciate cardiology input  continue statin Crestor  20 mg daily continue Toprol  50 mg daily and daily aspirin    CKD stage 3b, GFR 30-44 ml/min (HCC)  -chronic avoid nephrotoxic medications such as NSAIDs, Vanco Zosyn combo,  avoid hypotension, continue to follow renal function -Creatinine stable at 1.39 -Follow BMP in am   Hypokalemia -Replace potassium and follow BMP in am   Diabetic polyneuropathy associated with type 2 diabetes mellitus (HCC) Continue sliding scale insulin  with NovoLog    Dyslipidemia Continue Crestor  20 mg a day   Essential hypertension Continue Toprol  50 mg daily if BP allows restart Diovan    H/O aortic valve replacement Currently on heparin  Coumadin  on hold   Acute respiratory failure with hypoxia (HCC)  this patient has acute respiratory failure with Hypoxia   as documented by the  presence of following: O2 saturatio< 90% on RA  Likely due to:  CHF exacerbation,   Provide O2 therapy and titrate as needed  Continuous pulse ox   check Pulse ox with ambulation prior to discharge   may need  TC consult for home O2 set up  flutter valve ordered     DVT prophylaxis: Heparin   Medications     aspirin  EC  81 mg Oral Daily   furosemide   40 mg Intravenous Q12H   insulin  aspart  0-9 Units  Subcutaneous TID WC   latanoprost   1 drop Both Eyes QHS   metoprolol  succinate  50 mg Oral Daily   pantoprazole   40 mg Oral Daily   rosuvastatin   20 mg Oral QPM   sertraline   150 mg Oral Daily   sodium chloride  flush  3 mL Intravenous Q12H     Data Reviewed:   CBG:  Recent Labs  Lab 06/23/24 0605 06/23/24 1225 06/23/24 1547 06/23/24 2135 06/24/24 0626  GLUCAP 100* 149* 95 121* 113*    SpO2: 96 % O2 Flow Rate (L/min): 3 L/min    Vitals:   06/23/24 1918 06/24/24 0041 06/24/24 0500 06/24/24 0539  BP: (!) 106/30 (!) 117/41  (!) 100/33  Pulse: 67 70  88  Resp: 18 18  18   Temp: 97.8 F (36.6 C) 98 F (36.7 C)  97.9 F (36.6 C)  TempSrc: Oral Oral  Oral  SpO2: 97% 96%    Weight:  75.2 kg 75.2 kg   Height:          Data Reviewed:  Basic Metabolic Panel: Recent Labs  Lab 06/20/24 0909 06/20/24 0920 06/20/24 1600 06/21/24 0407 06/22/24 0618 06/23/24 0216  NA 138 141  --  138 140 141  K 4.0 4.0  --  3.4* 4.1 4.0  CL 104 106  --  104 106 107  CO2 23  --   --  22 23 24   GLUCOSE 150* 148*  --  117* 111* 109*  BUN 25* 25*  --  34* 34* 34*  CREATININE 1.26* 1.30*  --  1.44* 1.39* 1.39*  CALCIUM  9.4  --   --  8.4* 8.4* 8.5*  MG  --   --  1.8 1.7  --   --   PHOS  --   --   --  2.4*  --   --     CBC: Recent Labs  Lab 06/20/24 0909 06/20/24 0920 06/21/24 0407 06/22/24 0618 06/23/24 0216 06/24/24 0125  WBC 7.3  --  6.2 5.1 5.1 5.7  NEUTROABS 5.6  --   --   --   --   --   HGB 10.6* 10.9* 9.0* 8.8* 8.6* 8.5*  HCT 33.9* 32.0* 28.5* 28.2* 28.2* 28.0*  MCV 84.5  --  83.6 84.2 85.2 85.9  PLT 172  --  138* 124* 127* 150    LFT Recent Labs  Lab 06/20/24 0909 06/21/24 0407  AST 53* 58*  ALT 15 14  ALKPHOS 219* 181*  BILITOT 1.6* 1.4*  PROT 7.1 6.3*  ALBUMIN 3.9 3.4*     Antibiotics: Anti-infectives (From admission, onward)    None        CONSULTS cardiology  Code Status: Full code  Family Communication: No family at  bedside     Subjective   Patient seen and examined, denies any complaints.   Objective    Physical Examination:  General-appears in no acute distress Heart-S1-S2, regular, no murmur auscultated Lungs-clear to auscultation  bilaterally, no wheezing or crackles auscultated Abdomen-soft, nontender, no organomegaly Extremities-no edema in the lower extremities Neuro-alert, oriented x3, no focal deficit noted           Abigail Oliver S Marleta Lapierre   [facility] If 7PM-7AM, please contact night-coverage at [email or web address], Office  [phone]   06/24/2024, 8:47 AM  LOS: 3 days

## 2024-06-25 ENCOUNTER — Ambulatory Visit: Admitting: Professional Counselor

## 2024-06-25 DIAGNOSIS — I359 Nonrheumatic aortic valve disorder, unspecified: Secondary | ICD-10-CM | POA: Diagnosis not present

## 2024-06-25 DIAGNOSIS — I5033 Acute on chronic diastolic (congestive) heart failure: Secondary | ICD-10-CM | POA: Diagnosis not present

## 2024-06-25 LAB — CBC
HCT: 26.8 % — ABNORMAL LOW (ref 36.0–46.0)
Hemoglobin: 8 g/dL — ABNORMAL LOW (ref 12.0–15.0)
MCH: 25.8 pg — ABNORMAL LOW (ref 26.0–34.0)
MCHC: 29.9 g/dL — ABNORMAL LOW (ref 30.0–36.0)
MCV: 86.5 fL (ref 80.0–100.0)
Platelets: 139 10*3/uL — ABNORMAL LOW (ref 150–400)
RBC: 3.1 MIL/uL — ABNORMAL LOW (ref 3.87–5.11)
RDW: 17.3 % — ABNORMAL HIGH (ref 11.5–15.5)
WBC: 4.8 10*3/uL (ref 4.0–10.5)
nRBC: 0 % (ref 0.0–0.2)

## 2024-06-25 LAB — RETICULOCYTES
Immature Retic Fract: 29.1 % — ABNORMAL HIGH (ref 2.3–15.9)
RBC.: 3.25 MIL/uL — ABNORMAL LOW (ref 3.87–5.11)
Retic Count, Absolute: 76.4 10*3/uL (ref 19.0–186.0)
Retic Ct Pct: 2.4 % (ref 0.4–3.1)

## 2024-06-25 LAB — IRON AND TIBC
Iron: 32 ug/dL (ref 28–170)
Saturation Ratios: 9 % — ABNORMAL LOW (ref 10.4–31.8)
TIBC: 349 ug/dL (ref 250–450)
UIBC: 317 ug/dL

## 2024-06-25 LAB — PROTIME-INR
INR: 1.7 — ABNORMAL HIGH (ref 0.8–1.2)
Prothrombin Time: 21.1 s — ABNORMAL HIGH (ref 11.4–15.2)

## 2024-06-25 LAB — FOLATE: Folate: >20 ng/mL

## 2024-06-25 LAB — BASIC METABOLIC PANEL WITH GFR
Anion gap: 12 (ref 5–15)
BUN: 38 mg/dL — ABNORMAL HIGH (ref 8–23)
CO2: 23 mmol/L (ref 22–32)
Calcium: 8.4 mg/dL — ABNORMAL LOW (ref 8.9–10.3)
Chloride: 104 mmol/L (ref 98–111)
Creatinine, Ser: 1.48 mg/dL — ABNORMAL HIGH (ref 0.44–1.00)
GFR, Estimated: 36 mL/min — ABNORMAL LOW
Glucose, Bld: 140 mg/dL — ABNORMAL HIGH (ref 70–99)
Potassium: 4.1 mmol/L (ref 3.5–5.1)
Sodium: 139 mmol/L (ref 135–145)

## 2024-06-25 LAB — GLUCOSE, CAPILLARY
Glucose-Capillary: 119 mg/dL — ABNORMAL HIGH (ref 70–99)
Glucose-Capillary: 170 mg/dL — ABNORMAL HIGH (ref 70–99)
Glucose-Capillary: 145 mg/dL — ABNORMAL HIGH (ref 70–99)
Glucose-Capillary: 206 mg/dL — ABNORMAL HIGH (ref 70–99)

## 2024-06-25 LAB — FERRITIN: Ferritin: 136 ng/mL (ref 11–307)

## 2024-06-25 LAB — VITAMIN B12: Vitamin B-12: 1550 pg/mL — ABNORMAL HIGH (ref 180–914)

## 2024-06-25 LAB — HEPARIN LEVEL (UNFRACTIONATED)
Heparin Unfractionated: 1 [IU]/mL — ABNORMAL HIGH (ref 0.30–0.70)
Heparin Unfractionated: 0.37 [IU]/mL (ref 0.30–0.70)

## 2024-06-25 MED ORDER — DIPHENHYDRAMINE HCL 50 MG/ML IJ SOLN
50.0000 mg | Freq: Once | INTRAMUSCULAR | Status: DC
  Filled 2024-06-25: qty 1

## 2024-06-25 MED ORDER — IPRATROPIUM-ALBUTEROL 0.5-2.5 (3) MG/3ML IN SOLN: 3.0000 mL | Freq: Four times a day (QID) | RESPIRATORY_TRACT | Status: AC | PRN

## 2024-06-25 MED ORDER — METHYLPREDNISOLONE SODIUM SUCC 40 MG IJ SOLR
40.0000 mg | INTRAMUSCULAR | Status: AC
  Administered 2024-06-25 (×3): 40 mg via INTRAVENOUS
  Filled 2024-06-25 (×3): qty 1

## 2024-06-25 MED ORDER — DIPHENHYDRAMINE HCL 25 MG PO CAPS
50.0000 mg | ORAL_CAPSULE | Freq: Once | ORAL | Status: DC
  Filled 2024-06-25: qty 2

## 2024-06-25 NOTE — Plan of Care (Signed)

## 2024-06-25 NOTE — Progress Notes (Signed)
 PHARMACY - ANTICOAGULATION CONSULT NOTE  Pharmacy Consult for IV heparin  Indication: chest pain/ACS and mAVR  Allergies[1]  Patient Measurements: Height: 5' 2 (157.5 cm) Weight: 74.7 kg (164 lb 10.9 oz) IBW/kg (Calculated) : 50.1 HEPARIN  DW (KG): 66.5  Vital Signs: Temp: 98 F (36.7 C) (02/02 0322) Temp Source: Oral (02/02 0322) BP: 109/46 (02/02 0322) Pulse Rate: 60 (02/02 0322)  Labs: Recent Labs    06/23/24 0216 06/24/24 0125 06/25/24 0117 06/25/24 0505  HGB 8.6* 8.5* 8.0*  --   HCT 28.2* 28.0* 26.8*  --   PLT 127* 150 139*  --   LABPROT 23.8* 23.3* 21.1*  --   INR 2.0* 2.0* 1.7*  --   HEPARINUNFRC 0.38 0.36 1.00* 0.37  CREATININE 1.39*  --  1.48*  --     Estimated Creatinine Clearance: 29.6 mL/min (A) (by C-G formula based on SCr of 1.48 mg/dL (H)).  Assessment: EMMANUELA GHAZI is a 79 y.o. year old female presented on 06/20/2024 with concern for CP and ACS. On warfarin prior to admission for mAVR. Prior to admission regimen, warfarin 1.5mg  STTh and 3mg  all other days (Last dose prior to admission 1/27 afternoon). Pharmacy consulted for heparin  management.   Heparin  level remains therapeutic at 0.37, on heparin  infusion at 1300 units/hr. INR is subtherapeutic at 1.7 with warfarin on hold. Hgb 8, plt 139. No s/sx of bleeding or infusion issues.   Goal of Therapy:  INR goal: 2.5-3.5 Heparin  level 0.3-0.7 units/ml Monitor platelets by anticoagulation protocol: Yes   Plan:  Continue heparin  infusion at 1300 units/hr Daily heparin  level while on therapy Continue to monitor H&H and platelets F/u after cardiac cath (scheduled 06/25/24) - will determine plan for warfarin restart after   Thank you for allowing pharmacy to participate in this patient's care,  Suzen Sour, PharmD, BCCCP Clinical Pharmacist  Phone: [phone] 06/25/2024 7:49 AM  Please check AMION for all [facility] Pharmacy phone numbers After 10:00 PM, call Main Pharmacy [phone]      [1]   Allergies Allergen Reactions   Atorvastatin      Muscle pain   Gabapentin  Other (See Comments)    Stomach issues   Simvastatin  Other (See Comments)    Muscle pain   Sulfa  Antibiotics     unknown   Farxiga [Dapagliflozin] Nausea Only and Rash   Iodinated Contrast Media Rash     Red rash after cardiac cath 1 wk ago, ? Contrast allergy, requires 13 hr prep now per [email or web address]

## 2024-06-25 NOTE — Progress Notes (Signed)
 "  Rounding Note   Patient Name: Abigail Oliver Date of Encounter: 06/25/2024  [facility] Cardiologist: Darryle ONEIDA Decent, MD   Subjective Feeling short of breath.  No chest pain.  Burning epigstric discomfort.  Denies melena or hematochezia  Scheduled Meds:  aspirin  EC  81 mg Oral Daily   diphenhydrAMINE   50 mg Oral Once   Or   diphenhydrAMINE   50 mg Intravenous Once   furosemide   40 mg Intravenous Q12H   insulin  aspart  0-9 Units Subcutaneous TID WC   latanoprost   1 drop Both Eyes QHS   methylPREDNISolone  (SOLU-MEDROL ) injection  40 mg Intravenous Q4H   metoprolol  succinate  50 mg Oral Daily   pantoprazole   40 mg Oral Daily   rosuvastatin   20 mg Oral QPM   sertraline   150 mg Oral Daily   sodium chloride  flush  3 mL Intravenous Q12H   Continuous Infusions:  sodium chloride  1 mL/kg/hr (06/25/24 0655)   heparin  1,300 Units/hr (06/25/24 0655)   PRN Meds: acetaminophen  **OR** acetaminophen , ALPRAZolam , guaiFENesin -dextromethorphan , HYDROcodone -acetaminophen , ondansetron  **OR** ondansetron  (ZOFRAN ) IV, sodium chloride  flush, traZODone    Vital Signs  Vitals:   06/25/24 0012 06/25/24 0322 06/25/24 0323 06/25/24 0806  BP: (!) 126/45 (!) 109/46    Pulse: 66 60  61  Resp: 19 19    Temp: 98.3 F (36.8 C) 98 F (36.7 C)  98 F (36.7 C)  TempSrc: Oral Oral  Oral  SpO2: 99% 100%  97%  Weight:   74.7 kg   Height:        Intake/Output Summary (Last 24 hours) at 06/25/2024 1236 Last data filed at 06/25/2024 0655 Gross per 24 hour  Intake 927 ml  Output 1300 ml  Net -373 ml      06/25/2024    3:23 AM 06/24/2024    5:00 AM 06/24/2024   12:41 AM  Last 3 Weights  Weight (lbs) 164 lb 10.9 oz 165 lb 12.6 oz 165 lb 12.6 oz  Weight (kg) 74.7 kg 75.2 kg 75.2 kg      Telemetry Sinus rhythm.  Sinus bradycardia.  - Personally Reviewed  ECG  N/a - Personally Reviewed  Physical Exam  VS:  BP (!) 109/46 (BP Location: Left Arm)   Pulse 61   Temp 98 F (36.7 C) (Oral)    Resp 19   Ht 5' 2 (1.575 m)   Wt 74.7 kg   SpO2 97%   BMI 30.12 kg/m  , BMI Body mass index is 30.12 kg/m. GENERAL:  Frail.  No acute distress.  HEENT: Pupils equal round and reactive, fundi not visualized, oral mucosa unremarkable NECK:  No jugular venous distention, waveform within normal limits, carotid upstroke brisk and symmetric, no bruits, no thyromegaly LUNGS: Poor air movement HEART:  RRR.  PMI not displaced or sustained,S1 within normal limits, mechanical S2. No S3, no S4, no clicks, no rubs, no murmurs ABD:  Flat, positive bowel sounds normal in frequency in pitch, no bruits, no rebound, no guarding, no midline pulsatile mass, no hepatomegaly, no splenomegaly EXT:  2 plus pulses throughout, no edema, no cyanosis no clubbing SKIN:  No rashes no nodules NEURO:  Cranial nerves II through XII grossly intact, motor grossly intact throughout PSYCH:  Cognitively intact, oriented to person place and time  Labs High Sensitivity Troponin:  No results for input(s): TROPONINIHS in the last 720 hours.  Recent Labs  Lab 06/20/24 2034 06/20/24 2351 06/21/24 0100 06/21/24 0407 06/21/24 0656  TRNPT 809* 862*  857* 950* 1,096*       Chemistry Recent Labs  Lab 06/20/24 0909 06/20/24 0920 06/20/24 1600 06/21/24 0407 06/22/24 0618 06/23/24 0216 06/25/24 0117  NA 138   < >  --  138 140 141 139  K 4.0   < >  --  3.4* 4.1 4.0 4.1  CL 104   < >  --  104 106 107 104  CO2 23  --   --  22 23 24 23   GLUCOSE 150*   < >  --  117* 111* 109* 140*  BUN 25*   < >  --  34* 34* 34* 38*  CREATININE 1.26*   < >  --  1.44* 1.39* 1.39* 1.48*  CALCIUM  9.4  --   --  8.4* 8.4* 8.5* 8.4*  MG  --   --  1.8 1.7  --   --   --   PROT 7.1  --   --  6.3*  --   --   --   ALBUMIN 3.9  --   --  3.4*  --   --   --   AST 53*  --   --  58*  --   --   --   ALT 15  --   --  14  --   --   --   ALKPHOS 219*  --   --  181*  --   --   --   BILITOT 1.6*  --   --  1.4*  --   --   --   GFRNONAA 43*  --   --  37*  39* 39* 36*  ANIONGAP 11  --   --  12 11 10 12    < > = values in this interval not displayed.    Lipids No results for input(s): CHOL, TRIG, HDL, LABVLDL, LDLCALC, CHOLHDL in the last 168 hours.  Hematology Recent Labs  Lab 06/23/24 0216 06/24/24 0125 06/25/24 0117  WBC 5.1 5.7 4.8  RBC 3.31* 3.26* 3.10*  HGB 8.6* 8.5* 8.0*  HCT 28.2* 28.0* 26.8*  MCV 85.2 85.9 86.5  MCH 26.0 26.1 25.8*  MCHC 30.5 30.4 29.9*  RDW 17.2* 17.4* 17.3*  PLT 127* 150 139*   Thyroid  No results for input(s): TSH, FREET4 in the last 168 hours.  BNP Recent Labs  Lab 06/20/24 0909  PROBNP 10,760.0*    DDimer No results for input(s): DDIMER in the last 168 hours.   Radiology  No results found.  Cardiac Studies Echo 06/20/24:  1. Left ventricular ejection fraction, by estimation, is 55 to 60%. The  left ventricle has normal function. The left ventricle has no regional  wall motion abnormalities. There is mild asymmetric left ventricular  hypertrophy of the basal-septal segment.  Left ventricular diastolic parameters are consistent with Grade II  diastolic dysfunction (pseudonormalization).   2. Right ventricular systolic function is mildly reduced. The right  ventricular size is normal. There is moderately elevated pulmonary artery  systolic pressure. The estimated right ventricular systolic pressure is  55.2 mmHg.   3. Left atrial size was mildly dilated.   4. Right atrial size was mild to moderately dilated.   5. There is no evidence of pericardial effusion.   6. The mitral valve is degenerative. Mild to moderate mitral valve  regurgitation. No evidence of mitral stenosis. Moderate mitral annular  calcification.   7. Gradient across aortic vavle prosthesis unchanged from previous. The  aortic valve has been repaired/replaced.  Aortic valve regurgitation is not  visualized. There is a St. Jude mechanical valve present in the aortic  position. Procedure Date: 10/1998.  Echo  findings are consistent with normal structure and function of the  aortic valve prosthesis. Aortic valve area, by VTI measures 1.27 cm.  Aortic valve mean gradient measures 11.7 mmHg. Aortic valve Vmax measures  2.30 m/s.   8. The inferior vena cava is dilated in size with <50% respiratory  variability, suggesting right atrial pressure of 15 mmHg.    Patient Profile   79 y.o. female with CAD s/p CABG, PAD, HFpEF, s/p St. Jude mechanical AVR on warfarin, hyperlipidemia and CKD 3a here with NSTEMI.   Assessment & Plan   # NSTEMI:  # CAD s/p CABG:  # Hyperlipidemia:  History of CABG in 2020.  Now with shortness of breath and elevated high-sensitivity troponin to 1096.  H/H is downtrending.  Today it is down to 8 from 10.9 on admission.  She reports epigastric discomfort.  Last upper and lower endoscopy was in 2014 at which time she was found to have gastritis and hemorrhoids.  She denies melena or hematochezia.  Given that she has a need for anticoagulation with warfarin and aspirin  and would need clopidogrel  if she was found to have obstructive coronary disease on cath, recommend that she be evaluated for GI first.  She is amenable to this plan.  Warfarin currently on hold and she is on IV heparin .  Continue metoprolol  and rosuvastatin .  # HFpEF:  Mild pulmonary edema on chest x-ray.  Echo this admission revealed normal systolic function with mild asymmetric LVH and grade 2 diastolic dysfunction.  Right atrial pressure was 15 mmHg.  Continue IV Lasix .  Diuresis has been modest thus far.  She has poor air movement on exam.  Will add DuoNebs.  She will benefit from a right heart cath at the time of her left heart cath.    For questions or updates, please contact [facility] Please consult [email or web address] for contact info under       Signed, Annabella Scarce, MD  06/25/2024, 12:36 PM    "

## 2024-06-26 ENCOUNTER — Encounter: Payer: Self-pay | Attending: Family Medicine

## 2024-06-26 ENCOUNTER — Encounter (HOSPITAL_COMMUNITY): Payer: Self-pay | Admitting: Internal Medicine

## 2024-06-26 ENCOUNTER — Encounter

## 2024-06-26 ENCOUNTER — Inpatient Hospital Stay (HOSPITAL_COMMUNITY): Admitting: Anesthesiology

## 2024-06-26 LAB — CBC
HCT: 26 % — ABNORMAL LOW (ref 36.0–46.0)
Hemoglobin: 7.7 g/dL — ABNORMAL LOW (ref 12.0–15.0)
MCH: 25.8 pg — ABNORMAL LOW (ref 26.0–34.0)
MCHC: 29.6 g/dL — ABNORMAL LOW (ref 30.0–36.0)
MCV: 87.2 fL (ref 80.0–100.0)
Platelets: 127 10*3/uL — ABNORMAL LOW (ref 150–400)
RBC: 2.98 MIL/uL — ABNORMAL LOW (ref 3.87–5.11)
RDW: 17.2 % — ABNORMAL HIGH (ref 11.5–15.5)
WBC: 4.5 10*3/uL (ref 4.0–10.5)
nRBC: 0 % (ref 0.0–0.2)

## 2024-06-26 LAB — GLUCOSE, CAPILLARY
Glucose-Capillary: 125 mg/dL — ABNORMAL HIGH (ref 70–99)
Glucose-Capillary: 94 mg/dL (ref 70–99)
Glucose-Capillary: 93 mg/dL (ref 70–99)
Glucose-Capillary: 86 mg/dL (ref 70–99)
Glucose-Capillary: 125 mg/dL — ABNORMAL HIGH (ref 70–99)

## 2024-06-26 LAB — BASIC METABOLIC PANEL WITH GFR
Anion gap: 11 (ref 5–15)
BUN: 41 mg/dL — ABNORMAL HIGH (ref 8–23)
CO2: 21 mmol/L — ABNORMAL LOW (ref 22–32)
Calcium: 8.4 mg/dL — ABNORMAL LOW (ref 8.9–10.3)
Chloride: 109 mmol/L (ref 98–111)
Creatinine, Ser: 1.32 mg/dL — ABNORMAL HIGH (ref 0.44–1.00)
GFR, Estimated: 41 mL/min — ABNORMAL LOW
Glucose, Bld: 100 mg/dL — ABNORMAL HIGH (ref 70–99)
Potassium: 4.2 mmol/L (ref 3.5–5.1)
Sodium: 140 mmol/L (ref 135–145)

## 2024-06-26 LAB — PROTIME-INR
INR: 1.6 — ABNORMAL HIGH (ref 0.8–1.2)
Prothrombin Time: 20 s — ABNORMAL HIGH (ref 11.4–15.2)

## 2024-06-26 LAB — HEPARIN LEVEL (UNFRACTIONATED): Heparin Unfractionated: 0.48 [IU]/mL (ref 0.30–0.70)

## 2024-06-26 MED ORDER — ONDANSETRON HCL 4 MG/2ML IJ SOLN
INTRAMUSCULAR | Status: DC | PRN
  Administered 2024-06-26: 4 mg via INTRAVENOUS

## 2024-06-26 MED ORDER — LIDOCAINE 2% (20 MG/ML) 5 ML SYRINGE
INTRAMUSCULAR | Status: DC | PRN
  Administered 2024-06-26: 80 mg via INTRAVENOUS

## 2024-06-26 MED ORDER — PROPOFOL 500 MG/50ML IV EMUL
INTRAVENOUS | Status: DC | PRN
  Administered 2024-06-26: 100 ug/kg/min via INTRAVENOUS

## 2024-06-26 MED ORDER — PANTOPRAZOLE SODIUM 40 MG PO TBEC
40.0000 mg | DELAYED_RELEASE_TABLET | Freq: Two times a day (BID) | ORAL | Status: AC
  Administered 2024-06-26 – 2024-06-29 (×7): 40 mg via ORAL
  Filled 2024-06-26 (×7): qty 1

## 2024-06-26 MED ORDER — PROPOFOL 10 MG/ML IV BOLUS
INTRAVENOUS | Status: DC | PRN
  Administered 2024-06-26: 20 mg via INTRAVENOUS
  Administered 2024-06-26: 30 mg via INTRAVENOUS

## 2024-06-26 MED ORDER — SODIUM CHLORIDE 0.9 % IV SOLN: INTRAVENOUS | Status: DC

## 2024-06-26 NOTE — Anesthesia Preprocedure Evaluation (Signed)
 "                                  Anesthesia Evaluation  Patient identified by MRN, date of birth, ID band Patient awake    Reviewed: Allergy & Precautions, H&P , NPO status , Patient's Chart, lab work & pertinent test results  History of Anesthesia Complications (+) PONV and history of anesthetic complications  Airway Mallampati: II  TM Distance: >3 FB Neck ROM: Full    Dental no notable dental hx.    Pulmonary neg pulmonary ROS, neg sleep apnea   Pulmonary exam normal breath sounds clear to auscultation       Cardiovascular hypertension, (-) angina + CAD, + Past MI, + CABG, + Peripheral Vascular Disease and +CHF  Normal cardiovascular exam Rhythm:Regular Rate:Normal   1. Left ventricular ejection fraction, by estimation, is 55 to 60%. The  left ventricle has normal function. The left ventricle has no regional  wall motion abnormalities. There is mild asymmetric left ventricular  hypertrophy of the basal-septal segment.  Left ventricular diastolic parameters are consistent with Grade II  diastolic dysfunction (pseudonormalization).   2. Right ventricular systolic function is mildly reduced. The right  ventricular size is normal. There is moderately elevated pulmonary artery  systolic pressure. The estimated right ventricular systolic pressure is  55.2 mmHg.   3. Left atrial size was mildly dilated.   4. Right atrial size was mild to moderately dilated.   5. There is no evidence of pericardial effusion.   6. The mitral valve is degenerative. Mild to moderate mitral valve  regurgitation. No evidence of mitral stenosis. Moderate mitral annular  calcification.   7. Gradient across aortic vavle prosthesis unchanged from previous. The  aortic valve has been repaired/replaced. Aortic valve regurgitation is not  visualized. There is a St. Jude mechanical valve present in the aortic  position. Procedure Date: 10/1998.  Echo findings are consistent with normal  structure and function of the  aortic valve prosthesis. Aortic valve area, by VTI measures 1.27 cm.  Aortic valve mean gradient measures 11.7 mmHg. Aortic valve Vmax measures  2.30 m/s.   8. The inferior vena cava is dilated in size with <50% respiratory  variability, suggesting right atrial pressure of 15 mmHg.      Neuro/Psych  Headaches, neg Seizures PSYCHIATRIC DISORDERS Anxiety Depression       GI/Hepatic Neg liver ROS,GERD  ,,  Endo/Other  diabetes, Type 2    Renal/GU Renal disease  negative genitourinary   Musculoskeletal  (+) Arthritis ,    Abdominal   Peds negative pediatric ROS (+)  Hematology  (+) Blood dyscrasia, anemia   Anesthesia Other Findings   Reproductive/Obstetrics negative OB ROS                              Anesthesia Physical Anesthesia Plan  ASA: 3  Anesthesia Plan: MAC   Post-op Pain Management:    Induction: Intravenous  PONV Risk Score and Plan: 3 and Propofol  infusion and Treatment may vary due to age or medical condition  Airway Management Planned: Natural Airway  Additional Equipment:   Intra-op Plan:   Post-operative Plan:   Informed Consent: I have reviewed the patients History and Physical, chart, labs and discussed the procedure including the risks, benefits and alternatives for the proposed anesthesia with the patient or authorized representative who has  indicated his/her understanding and acceptance.     Dental advisory given  Plan Discussed with: CRNA  Anesthesia Plan Comments:          Anesthesia Quick Evaluation  "

## 2024-06-26 NOTE — Progress Notes (Signed)
 PHARMACY - ANTICOAGULATION CONSULT NOTE  Pharmacy Consult for IV heparin  Indication: chest pain/ACS and mAVR  Allergies[1]  Patient Measurements: Height: 5' 2 (157.5 cm) Weight: 77.1 kg (169 lb 15.6 oz) IBW/kg (Calculated) : 50.1 HEPARIN  DW (KG): 66.5  Vital Signs: Temp: 97.5 F (36.4 C) (02/03 0726) Temp Source: Oral (02/03 0726) BP: 126/66 (02/03 0726) Pulse Rate: 61 (02/03 0726)  Labs: Recent Labs    06/24/24 0125 06/25/24 0117 06/25/24 0505 06/26/24 0237  HGB 8.5* 8.0*  --  7.7*  HCT 28.0* 26.8*  --  26.0*  PLT 150 139*  --  127*  LABPROT 23.3* 21.1*  --  20.0*  INR 2.0* 1.7*  --  1.6*  HEPARINUNFRC 0.36 1.00* 0.37 0.48  CREATININE  --  1.48*  --   --     Estimated Creatinine Clearance: 30.1 mL/min (A) (by C-G formula based on SCr of 1.48 mg/dL (H)).  Assessment: Abigail Oliver is a 79 y.o. year old female presented on 06/20/2024 with concern for CP and ACS. On warfarin prior to admission for mAVR. Prior to admission regimen, warfarin 1.5mg  STTh and 3mg  all other days (Last dose prior to admission 1/27 afternoon). Pharmacy consulted for heparin  management.   Heparin  level remains therapeutic at 0.48, on heparin  infusion at 1300 units/hr. INR is subtherapeutic at 1.6 with warfarin on hold. Hgb down to 7.7, plt down to 127. No s/sx of bleeding or infusion issues. EGD planned for today.  Goal of Therapy:  INR goal: 2.5-3.5 Heparin  level 0.3-0.7 units/ml Monitor platelets by anticoagulation protocol: Yes   Plan:  Continue heparin  infusion at 1300 units/hr Daily heparin  level, CBC  Continue to monitor H&H, platelets, and s/sx bleeding F/u after cardiac cath (scheduled 06/27/24) - will determine plan for warfarin restart after   Thank you for involving pharmacy in this patient's care.  Delon Sax, PharmD, BCPS Clinical Pharmacist Clinical phone for 06/26/2024 is [phone] 06/26/2024 8:30 AM        [1]  Allergies Allergen Reactions   Atorvastatin       Muscle pain   Gabapentin  Other (See Comments)    Stomach issues   Simvastatin  Other (See Comments)    Muscle pain   Sulfa  Antibiotics     unknown   Farxiga [Dapagliflozin] Nausea Only and Rash   Iodinated Contrast Media Rash     Red rash after cardiac cath 1 wk ago, ? Contrast allergy, requires 13 hr prep now per [email or web address]

## 2024-06-26 NOTE — Plan of Care (Signed)
 Patient got EGD, discovered no bleed, did discover hiatal hernia.   Heart cath tomorrow, benadryl  to be given before iodated contrast medium for heart cath.

## 2024-06-26 NOTE — Transfer of Care (Signed)
 Immediate Anesthesia Transfer of Care Note  Patient: Abigail Oliver  Procedure(s) Performed: EGD (ESOPHAGOGASTRODUODENOSCOPY)  Patient Location: PACU and Endoscopy Unit  Anesthesia Type:MAC  Level of Consciousness: awake, alert , oriented, and patient cooperative  Airway & Oxygen Therapy: Patient Spontanous Breathing and Patient connected to nasal cannula oxygen  Post-op Assessment: Report given to RN, Post -op Vital signs reviewed and stable, and Patient moving all extremities  Post vital signs: Reviewed and stable  Last Vitals:  Vitals Value Taken Time  BP 98/44 06/26/24 15:22  Temp    Pulse 62 06/26/24 15:24  Resp 18 06/26/24 15:24  SpO2 96 % 06/26/24 15:24  Vitals shown include unfiled device data.  Last Pain:  Vitals:   06/26/24 1400  TempSrc: Temporal  PainSc: 5          Complications: There were no known notable events for this encounter.

## 2024-06-27 ENCOUNTER — Encounter (HOSPITAL_COMMUNITY): Admission: EM | Payer: Self-pay | Source: Home / Self Care | Attending: Family Medicine

## 2024-06-27 DIAGNOSIS — D649 Anemia, unspecified: Secondary | ICD-10-CM

## 2024-06-27 DIAGNOSIS — I739 Peripheral vascular disease, unspecified: Secondary | ICD-10-CM

## 2024-06-27 DIAGNOSIS — E785 Hyperlipidemia, unspecified: Secondary | ICD-10-CM | POA: Diagnosis not present

## 2024-06-27 DIAGNOSIS — E1169 Type 2 diabetes mellitus with other specified complication: Secondary | ICD-10-CM

## 2024-06-27 DIAGNOSIS — I10 Essential (primary) hypertension: Secondary | ICD-10-CM | POA: Diagnosis not present

## 2024-06-27 DIAGNOSIS — F419 Anxiety disorder, unspecified: Secondary | ICD-10-CM | POA: Diagnosis not present

## 2024-06-27 DIAGNOSIS — N1832 Chronic kidney disease, stage 3b: Secondary | ICD-10-CM | POA: Diagnosis not present

## 2024-06-27 DIAGNOSIS — I5033 Acute on chronic diastolic (congestive) heart failure: Secondary | ICD-10-CM | POA: Diagnosis not present

## 2024-06-27 DIAGNOSIS — I25119 Atherosclerotic heart disease of native coronary artery with unspecified angina pectoris: Secondary | ICD-10-CM | POA: Diagnosis not present

## 2024-06-27 LAB — DIFFERENTIAL
Abs Immature Granulocytes: 0.04 10*3/uL (ref 0.00–0.07)
Basophils Absolute: 0 10*3/uL (ref 0.0–0.1)
Basophils Relative: 0 %
Eosinophils Absolute: 0.1 10*3/uL (ref 0.0–0.5)
Eosinophils Relative: 1 %
Immature Granulocytes: 1 %
Lymphocytes Relative: 11 %
Lymphs Abs: 0.5 10*3/uL — ABNORMAL LOW (ref 0.7–4.0)
Monocytes Absolute: 0.1 10*3/uL (ref 0.1–1.0)
Monocytes Relative: 3 %
Neutro Abs: 4 10*3/uL (ref 1.7–7.7)
Neutrophils Relative %: 84 %

## 2024-06-27 LAB — BASIC METABOLIC PANEL WITH GFR
Anion gap: 10 (ref 5–15)
BUN: 43 mg/dL — ABNORMAL HIGH (ref 8–23)
CO2: 22 mmol/L (ref 22–32)
Calcium: 8.2 mg/dL — ABNORMAL LOW (ref 8.9–10.3)
Chloride: 107 mmol/L (ref 98–111)
Creatinine, Ser: 1.41 mg/dL — ABNORMAL HIGH (ref 0.44–1.00)
GFR, Estimated: 38 mL/min — ABNORMAL LOW
Glucose, Bld: 104 mg/dL — ABNORMAL HIGH (ref 70–99)
Potassium: 4.2 mmol/L (ref 3.5–5.1)
Sodium: 139 mmol/L (ref 135–145)

## 2024-06-27 LAB — TECHNOLOGIST SMEAR REVIEW: Plt Morphology: NORMAL

## 2024-06-27 LAB — GLUCOSE, CAPILLARY
Glucose-Capillary: 101 mg/dL — ABNORMAL HIGH (ref 70–99)
Glucose-Capillary: 178 mg/dL — ABNORMAL HIGH (ref 70–99)
Glucose-Capillary: 139 mg/dL — ABNORMAL HIGH (ref 70–99)

## 2024-06-27 LAB — POCT I-STAT 7, (LYTES, BLD GAS, ICA,H+H)
Acid-base deficit: 4 mmol/L — ABNORMAL HIGH (ref 0.0–2.0)
Bicarbonate: 21.6 mmol/L (ref 20.0–28.0)
Calcium, Ion: 1.15 mmol/L (ref 1.15–1.40)
HCT: 28 % — ABNORMAL LOW (ref 36.0–46.0)
Hemoglobin: 9.5 g/dL — ABNORMAL LOW (ref 12.0–15.0)
O2 Saturation: 95 %
Potassium: 4.6 mmol/L (ref 3.5–5.1)
Sodium: 143 mmol/L (ref 135–145)
TCO2: 23 mmol/L (ref 22–32)
pCO2 arterial: 39.8 mmHg (ref 32–48)
pH, Arterial: 7.343 — ABNORMAL LOW (ref 7.35–7.45)
pO2, Arterial: 80 mmHg — ABNORMAL LOW (ref 83–108)

## 2024-06-27 LAB — CBC
HCT: 25.5 % — ABNORMAL LOW (ref 36.0–46.0)
HCT: 27.6 % — ABNORMAL LOW (ref 36.0–46.0)
Hemoglobin: 7.4 g/dL — ABNORMAL LOW (ref 12.0–15.0)
Hemoglobin: 8.3 g/dL — ABNORMAL LOW (ref 12.0–15.0)
MCH: 26 pg (ref 26.0–34.0)
MCH: 26.1 pg (ref 26.0–34.0)
MCHC: 29 g/dL — ABNORMAL LOW (ref 30.0–36.0)
MCHC: 30.1 g/dL (ref 30.0–36.0)
MCV: 89.5 fL (ref 80.0–100.0)
MCV: 86.8 fL (ref 80.0–100.0)
Platelets: 151 10*3/uL (ref 150–400)
Platelets: 141 10*3/uL — ABNORMAL LOW (ref 150–400)
RBC: 2.85 MIL/uL — ABNORMAL LOW (ref 3.87–5.11)
RBC: 3.18 MIL/uL — ABNORMAL LOW (ref 3.87–5.11)
RDW: 17.4 % — ABNORMAL HIGH (ref 11.5–15.5)
RDW: 17.7 % — ABNORMAL HIGH (ref 11.5–15.5)
WBC: 5.3 10*3/uL (ref 4.0–10.5)
WBC: 4.8 10*3/uL (ref 4.0–10.5)
nRBC: 0 % (ref 0.0–0.2)
nRBC: 0 % (ref 0.0–0.2)

## 2024-06-27 LAB — POCT I-STAT EG7
Acid-base deficit: 4 mmol/L — ABNORMAL HIGH (ref 0.0–2.0)
Acid-base deficit: 5 mmol/L — ABNORMAL HIGH (ref 0.0–2.0)
Bicarbonate: 21.9 mmol/L (ref 20.0–28.0)
Bicarbonate: 22.3 mmol/L (ref 20.0–28.0)
Calcium, Ion: 1.12 mmol/L — ABNORMAL LOW (ref 1.15–1.40)
Calcium, Ion: 1.15 mmol/L (ref 1.15–1.40)
HCT: 28 % — ABNORMAL LOW (ref 36.0–46.0)
HCT: 29 % — ABNORMAL LOW (ref 36.0–46.0)
Hemoglobin: 9.5 g/dL — ABNORMAL LOW (ref 12.0–15.0)
Hemoglobin: 9.9 g/dL — ABNORMAL LOW (ref 12.0–15.0)
O2 Saturation: 55 %
O2 Saturation: 56 %
Potassium: 4.3 mmol/L (ref 3.5–5.1)
Potassium: 4.5 mmol/L (ref 3.5–5.1)
Sodium: 137 mmol/L (ref 135–145)
Sodium: 138 mmol/L (ref 135–145)
TCO2: 23 mmol/L (ref 22–32)
TCO2: 24 mmol/L (ref 22–32)
pCO2, Ven: 47.1 mmHg (ref 44–60)
pCO2, Ven: 47.6 mmHg (ref 44–60)
pH, Ven: 7.27 (ref 7.25–7.43)
pH, Ven: 7.283 (ref 7.25–7.43)
pO2, Ven: 33 mmHg (ref 32–45)
pO2, Ven: 33 mmHg (ref 32–45)

## 2024-06-27 LAB — HEPARIN LEVEL (UNFRACTIONATED): Heparin Unfractionated: 0.44 [IU]/mL (ref 0.30–0.70)

## 2024-06-27 LAB — LACTATE DEHYDROGENASE: LDH: 236 U/L — ABNORMAL HIGH (ref 105–235)

## 2024-06-27 LAB — PROTIME-INR
INR: 1.4 — ABNORMAL HIGH (ref 0.8–1.2)
Prothrombin Time: 18.3 s — ABNORMAL HIGH (ref 11.4–15.2)

## 2024-06-27 LAB — PREPARE RBC (CROSSMATCH)

## 2024-06-27 MED ORDER — SODIUM CHLORIDE 0.9% FLUSH: 3.0000 mL | INTRAVENOUS | Status: DC | PRN

## 2024-06-27 MED ORDER — WARFARIN SODIUM 2 MG PO TABS
4.0000 mg | ORAL_TABLET | Freq: Once | ORAL | Status: AC
  Administered 2024-06-27: 4 mg via ORAL
  Filled 2024-06-27: qty 2

## 2024-06-27 MED ORDER — WARFARIN - PHARMACIST DOSING INPATIENT: Freq: Every day | Status: AC

## 2024-06-27 MED ORDER — FUROSEMIDE 10 MG/ML IJ SOLN
80.0000 mg | Freq: Once | INTRAMUSCULAR | Status: AC
  Administered 2024-06-27: 80 mg via INTRAVENOUS

## 2024-06-27 MED ORDER — LIDOCAINE HCL (PF) 1 % IJ SOLN
INTRAMUSCULAR | Status: DC | PRN
  Administered 2024-06-27: 2 mL via INTRADERMAL
  Administered 2024-06-27: 5 mL via INTRADERMAL

## 2024-06-27 MED ORDER — SODIUM CHLORIDE 0.9% FLUSH
3.0000 mL | Freq: Two times a day (BID) | INTRAVENOUS | Status: DC
  Administered 2024-06-27: 3 mL via INTRAVENOUS

## 2024-06-27 MED ORDER — METHYLPREDNISOLONE SODIUM SUCC 40 MG IJ SOLR
40.0000 mg | INTRAMUSCULAR | Status: AC
  Administered 2024-06-27 (×3): 40 mg via INTRAVENOUS
  Filled 2024-06-27 (×3): qty 1

## 2024-06-27 MED ORDER — HEPARIN (PORCINE) 25000 UT/250ML-% IV SOLN: 1300.0000 [IU]/h | INTRAVENOUS | Status: DC

## 2024-06-27 MED ORDER — FUROSEMIDE 10 MG/ML IJ SOLN
INTRAMUSCULAR | Status: AC
  Filled 2024-06-27: qty 8

## 2024-06-27 MED ORDER — VERAPAMIL HCL 2.5 MG/ML IV SOLN
INTRAVENOUS | Status: AC
  Filled 2024-06-27: qty 2

## 2024-06-27 MED ORDER — IOHEXOL 350 MG/ML SOLN
INTRAVENOUS | Status: DC | PRN
  Administered 2024-06-27: 70 mL

## 2024-06-27 MED ORDER — FREE WATER
500.0000 mL | Freq: Once | Status: AC
  Administered 2024-06-27: 500 mL via ORAL

## 2024-06-27 MED ORDER — FUROSEMIDE 10 MG/ML IJ SOLN: 80.0000 mg | Freq: Once | INTRAMUSCULAR | Status: AC

## 2024-06-27 MED ORDER — DIPHENHYDRAMINE HCL 25 MG PO CAPS: 50.0000 mg | ORAL_CAPSULE | Freq: Once | ORAL | Status: AC

## 2024-06-27 MED ORDER — SODIUM CHLORIDE 0.9 % IV SOLN: 250.0000 mL | INTRAVENOUS | Status: AC | PRN

## 2024-06-27 MED ORDER — SODIUM CHLORIDE 0.9 % IV SOLN: 250.0000 mL | INTRAVENOUS | Status: DC | PRN

## 2024-06-27 MED ORDER — FUROSEMIDE 10 MG/ML IJ SOLN
80.0000 mg | Freq: Two times a day (BID) | INTRAMUSCULAR | Status: DC
  Administered 2024-06-28 – 2024-06-29 (×3): 80 mg via INTRAVENOUS
  Filled 2024-06-27 (×3): qty 8

## 2024-06-27 MED ORDER — FUROSEMIDE 10 MG/ML IJ SOLN
60.0000 mg | Freq: Once | INTRAMUSCULAR | Status: DC
  Administered 2024-06-27: 60 mg via INTRAVENOUS
  Filled 2024-06-27: qty 6

## 2024-06-27 MED ORDER — SODIUM CHLORIDE 0.9% FLUSH: 3.0000 mL | INTRAVENOUS | Status: AC | PRN

## 2024-06-27 MED ORDER — SODIUM CHLORIDE 0.9% IV SOLUTION: Freq: Once | INTRAVENOUS | Status: AC

## 2024-06-27 MED ORDER — VERAPAMIL HCL 2.5 MG/ML IV SOLN
INTRAVENOUS | Status: DC | PRN
  Administered 2024-06-27: 10 mL via INTRA_ARTERIAL

## 2024-06-27 MED ORDER — HEPARIN (PORCINE) 25000 UT/250ML-% IV SOLN
1350.0000 [IU]/h | INTRAVENOUS | Status: AC
  Administered 2024-06-27: 1300 [IU]/h via INTRAVENOUS
  Administered 2024-06-28 – 2024-06-29 (×2): 1350 [IU]/h via INTRAVENOUS
  Filled 2024-06-27: qty 250

## 2024-06-27 MED ORDER — MIDAZOLAM HCL (PF) 2 MG/2ML IJ SOLN
INTRAMUSCULAR | Status: DC | PRN
  Administered 2024-06-27: 1 mg via INTRAVENOUS

## 2024-06-27 MED ORDER — SODIUM CHLORIDE 0.9% FLUSH
3.0000 mL | Freq: Two times a day (BID) | INTRAVENOUS | Status: AC
  Administered 2024-06-27 – 2024-06-29 (×5): 3 mL via INTRAVENOUS

## 2024-06-27 MED ORDER — DIPHENHYDRAMINE HCL 50 MG/ML IJ SOLN
50.0000 mg | Freq: Once | INTRAMUSCULAR | Status: AC
  Administered 2024-06-27: 50 mg via INTRAVENOUS
  Filled 2024-06-27: qty 1

## 2024-06-27 MED ORDER — MIDAZOLAM HCL 2 MG/2ML IJ SOLN
INTRAMUSCULAR | Status: AC
  Filled 2024-06-27: qty 2

## 2024-06-27 MED ORDER — ASPIRIN 81 MG PO CHEW
81.0000 mg | CHEWABLE_TABLET | ORAL | Status: AC
  Administered 2024-06-27: 81 mg via ORAL
  Filled 2024-06-27: qty 1

## 2024-06-27 MED ORDER — HEPARIN SODIUM (PORCINE) 1000 UNIT/ML IJ SOLN
INTRAMUSCULAR | Status: AC
  Filled 2024-06-27: qty 10

## 2024-06-27 MED ORDER — HEPARIN SODIUM (PORCINE) 1000 UNIT/ML IJ SOLN
INTRAMUSCULAR | Status: DC | PRN
  Administered 2024-06-27: 3000 [IU] via INTRAVENOUS

## 2024-06-27 NOTE — Assessment & Plan Note (Signed)
 Patient is currently chest pain free Monitoring patient on telemetry Aspirin, lipitor

## 2024-06-27 NOTE — Assessment & Plan Note (Addendum)
 Positive iron  deficiency with serum iron  at 32, ferritin 136, TIBC 349 and transferrin saturation 9 Follow up hgb is 7.4  02/03 EGD with normal stomach and normal examined duodenum. Recommendations for pantoprazole  bid with meals. Ok for anticoagulation. Will plan for IV iron  during this hospitalization

## 2024-06-27 NOTE — Assessment & Plan Note (Addendum)
 Echocardiogram with preserved LV systolic function with EF 55 to 60%, mild asymmetric left ventricular hypertrophy of the basal segment. Grade II diastolic dysfunction with pseudo normalization EA, RV systolic function with mild reduction, RVSP 55.2 mmHg, LA with mild dilatation, RA with mild to moderate dilatation, no pericardial effusion, mild to moderate mitral valve regurgitation, sp aortic valve replacement, (St Jude mechanical valve),   Documented urine output is 600 cc Systolic blood pressure 120 to 130 mmHg.   Plan to continue diuresis with furosemide  40 mg IV bid Continue metoprolol  succinate Limited medical therapy due to reduced GFR.  Plan for one unit PRBC for hgb less than 8, will add one extra dose of furosemide  60 mg x1 post transfusion.   Acute hypoxemic respiratory failure due to acute cardiogenic pulmonary edema 02 saturation today 98% on 4 L.min per [place] Plan to continue diuresis

## 2024-06-27 NOTE — H&P (View-Only) (Signed)
 "  Rounding Note   Patient Name: Abigail Oliver Date of Encounter: 06/27/2024  [facility] Cardiologist: Darryle ONEIDA Decent, MD   Subjective Feeling more short of breath.  No chest pain.    Scheduled Meds:  sodium chloride    Intravenous Once   aspirin  EC  81 mg Oral Daily   diphenhydrAMINE   50 mg Oral Once   Or   diphenhydrAMINE   50 mg Intravenous Once   furosemide   80 mg Intravenous Once   furosemide   80 mg Intravenous Once   [START ON 06/28/2024] furosemide   80 mg Intravenous BID   insulin  aspart  0-9 Units Subcutaneous TID WC   latanoprost   1 drop Both Eyes QHS   methylPREDNISolone  (SOLU-MEDROL ) injection  40 mg Intravenous Q4H   metoprolol  succinate  50 mg Oral Daily   pantoprazole   40 mg Oral BID AC   rosuvastatin   20 mg Oral QPM   sertraline   150 mg Oral Daily   sodium chloride  flush  3 mL Intravenous Q12H   sodium chloride  flush  3 mL Intravenous Q12H   Continuous Infusions:  sodium chloride      heparin  1,300 Units/hr (06/26/24 1458)   PRN Meds: sodium chloride , acetaminophen  **OR** acetaminophen , ALPRAZolam , guaiFENesin -dextromethorphan , HYDROcodone -acetaminophen , ipratropium-albuterol , ondansetron  **OR** ondansetron  (ZOFRAN ) IV, sodium chloride  flush, sodium chloride  flush, traZODone    Vital Signs  Vitals:   06/26/24 2021 06/27/24 0024 06/27/24 0415 06/27/24 0801  BP: (!) 127/53 (!) 113/44 (!) 113/45 (!) 132/51  Pulse: 72 65 62   Resp: 18 18 18 17   Temp: 98 F (36.7 C) 97.8 F (36.6 C) 98 F (36.7 C) 97.8 F (36.6 C)  TempSrc: Oral Oral Oral Oral  SpO2: 96% 98% 94%   Weight:   76.9 kg   Height:        Intake/Output Summary (Last 24 hours) at 06/27/2024 1024 Last data filed at 06/27/2024 0528 Gross per 24 hour  Intake 890 ml  Output 600 ml  Net 290 ml      06/27/2024    4:15 AM 06/26/2024    5:59 AM 06/25/2024    3:23 AM  Last 3 Weights  Weight (lbs) 169 lb 8.5 oz 169 lb 15.6 oz 164 lb 10.9 oz  Weight (kg) 76.9 kg 77.1 kg 74.7 kg       Telemetry Sinus rhythm.  Sinus bradycardia.  - Personally Reviewed  ECG  N/a - Personally Reviewed  Physical Exam  VS:  BP (!) 132/51 (BP Location: Right Arm)   Pulse 62   Temp 97.8 F (36.6 C) (Oral)   Resp 17   Ht 5' 2 (1.575 m)   Wt 76.9 kg   SpO2 94%   BMI 31.01 kg/m  , BMI Body mass index is 31.01 kg/m. GENERAL:  Frail.  No acute distress.  HEENT: Pupils equal round and reactive, fundi not visualized, oral mucosa unremarkable NECK:  No jugular venous distention, waveform within normal limits, carotid upstroke brisk and symmetric, no bruits, no thyromegaly LUNGS: Poor air movement HEART:  RRR.  PMI not displaced or sustained,S1 within normal limits, mechanical S2. No S3, no S4, no clicks, no rubs, no murmurs ABD:  Flat, positive bowel sounds normal in frequency in pitch, no bruits, no rebound, no guarding, no midline pulsatile mass, no hepatomegaly, no splenomegaly EXT:  2 plus pulses throughout, no edema, no cyanosis no clubbing SKIN:  No rashes no nodules NEURO:  Cranial nerves II through XII grossly intact, motor grossly intact throughout PSYCH:  Cognitively  intact, oriented to person place and time  Labs High Sensitivity Troponin:  No results for input(s): TROPONINIHS in the last 720 hours.  Recent Labs  Lab 06/20/24 2034 06/20/24 2351 06/21/24 0100 06/21/24 0407 06/21/24 0656  TRNPT 809* 862* 857* 950* 1,096*       Chemistry Recent Labs  Lab 06/20/24 1600 06/21/24 0407 06/22/24 0618 06/25/24 0117 06/26/24 1118 06/27/24 0231  NA  --  138   < > 139 140 139  K  --  3.4*   < > 4.1 4.2 4.2  CL  --  104   < > 104 109 107  CO2  --  22   < > 23 21* 22  GLUCOSE  --  117*   < > 140* 100* 104*  BUN  --  34*   < > 38* 41* 43*  CREATININE  --  1.44*   < > 1.48* 1.32* 1.41*  CALCIUM   --  8.4*   < > 8.4* 8.4* 8.2*  MG 1.8 1.7  --   --   --   --   PROT  --  6.3*  --   --   --   --   ALBUMIN  --  3.4*  --   --   --   --   AST  --  58*  --   --   --   --    ALT  --  14  --   --   --   --   ALKPHOS  --  181*  --   --   --   --   BILITOT  --  1.4*  --   --   --   --   GFRNONAA  --  37*   < > 36* 41* 38*  ANIONGAP  --  12   < > 12 11 10    < > = values in this interval not displayed.    Lipids No results for input(s): CHOL, TRIG, HDL, LABVLDL, LDLCALC, CHOLHDL in the last 168 hours.  Hematology Recent Labs  Lab 06/25/24 0117 06/25/24 1730 06/26/24 0237 06/27/24 0231  WBC 4.8  --  4.5 5.3  RBC 3.10* 3.25* 2.98* 2.85*  HGB 8.0*  --  7.7* 7.4*  HCT 26.8*  --  26.0* 25.5*  MCV 86.5  --  87.2 89.5  MCH 25.8*  --  25.8* 26.0  MCHC 29.9*  --  29.6* 29.0*  RDW 17.3*  --  17.2* 17.4*  PLT 139*  --  127* 151   Thyroid  No results for input(s): TSH, FREET4 in the last 168 hours.  BNP No results for input(s): BNP, PROBNP in the last 168 hours.   DDimer No results for input(s): DDIMER in the last 168 hours.   Radiology  No results found.  Cardiac Studies Echo 06/20/24:  1. Left ventricular ejection fraction, by estimation, is 55 to 60%. The  left ventricle has normal function. The left ventricle has no regional  wall motion abnormalities. There is mild asymmetric left ventricular  hypertrophy of the basal-septal segment.  Left ventricular diastolic parameters are consistent with Grade II  diastolic dysfunction (pseudonormalization).   2. Right ventricular systolic function is mildly reduced. The right  ventricular size is normal. There is moderately elevated pulmonary artery  systolic pressure. The estimated right ventricular systolic pressure is  55.2 mmHg.   3. Left atrial size was mildly dilated.   4. Right atrial size was mild to moderately dilated.  5. There is no evidence of pericardial effusion.   6. The mitral valve is degenerative. Mild to moderate mitral valve  regurgitation. No evidence of mitral stenosis. Moderate mitral annular  calcification.   7. Gradient across aortic vavle prosthesis unchanged  from previous. The  aortic valve has been repaired/replaced. Aortic valve regurgitation is not  visualized. There is a St. Jude mechanical valve present in the aortic  position. Procedure Date: 10/1998.  Echo findings are consistent with normal structure and function of the  aortic valve prosthesis. Aortic valve area, by VTI measures 1.27 cm.  Aortic valve mean gradient measures 11.7 mmHg. Aortic valve Vmax measures  2.30 m/s.   8. The inferior vena cava is dilated in size with <50% respiratory  variability, suggesting right atrial pressure of 15 mmHg.    Patient Profile   79 y.o. female with CAD s/p CABG, PAD, HFpEF, s/p St. Jude mechanical AVR on warfarin, hyperlipidemia and CKD 3a here with NSTEMI.   Assessment & Plan   # NSTEMI:  # CAD s/p CABG:  # Hyperlipidemia:  History of CABG in 2020.  Now with shortness of breath and elevated high-sensitivity troponin to 1096.  H/H is downtrending.  Today it is down to 7.4 from 10.9 on admission.  Upper endoscopy revealed small hiatal hernia and possible esophagitis but no active bleeding.  Will transfuse 1unit pRBC to maintain hgb >8.  Planning for LHC/RHC today, likely diagnositic given her anemia.  She reports epigastric discomfort.  Last upper and lower endoscopy was in 2014 at which time she was found to have gastritis and hemorrhoids.  She denies melena or hematochezia.  Given that she has a need for anticoagulation with warfarin and aspirin  and would need clopidogrel  if she was found to have obstructive coronary disease on cath, she is going for EGD today.  Plan for [facility] tomorrow if h/h stable and OK per GI.  They do not feel she is stable for colonoscopy.   Informed Consent   Shared Decision Making/Informed Consent The risks [stroke (1 in 1000), death (1 in 1000), kidney failure [usually temporary] (1 in 500), bleeding (1 in 200), allergic reaction [possibly serious] (1 in 200)], benefits (diagnostic support and management of coronary artery  disease) and alternatives of a cardiac catheterization were discussed in detail with [name] and she is willing to proceed.      # HFpEF:  Mild pulmonary edema on chest x-ray.  Echo this admission revealed normal systolic function with mild asymmetric LVH and grade 2 diastolic dysfunction.  Right atrial pressure was 15 mmHg.  Continue IV Lasix .  Will increase to 80mg  IV bid.  Diuresis is slow and she reports increased SOB. She will benefit from a right heart cath at the time of her left heart cath.  # Mechanical AVR:  Warfarin on hold.  She is on heparin .  Gradient stable on echo this admission.  Which check blood smear, haptoglobin and LDH given her worsening anemia without clear source of GI bleed.    For questions or updates, please contact [facility] Please consult [email or web address] for contact info under       Signed, Annabella Scarce, MD  06/27/2024, 10:24 AM    "

## 2024-06-27 NOTE — Assessment & Plan Note (Signed)
 Continue blood pressure monitoring Continue metoprolol  succinate Aggressive diuresis

## 2024-06-27 NOTE — Assessment & Plan Note (Addendum)
 NSTEMI.  Patient placed on IV heparin  for anticoagulation Continue asa and statin Metoprolol  succinate.  Further work up with cardiac catheterization Plan for PRBC transfusion today due to hgb less than 8  Pre cath protocol for contrast allergy

## 2024-06-27 NOTE — Assessment & Plan Note (Addendum)
 Continue with alprazolam  and sertraline 

## 2024-06-27 NOTE — Progress Notes (Signed)
 PHARMACY - ANTICOAGULATION CONSULT NOTE  Pharmacy Consult for IV heparin  Indication: chest pain/ACS and mAVR  Allergies[1]  Patient Measurements: Height: 5' 2 (157.5 cm) Weight: 76.9 kg (169 lb 8.5 oz) IBW/kg (Calculated) : 50.1 HEPARIN  DW (KG): 66.5  Vital Signs: Temp: 98 F (36.7 C) (02/04 0415) Temp Source: Oral (02/04 0415) BP: 113/45 (02/04 0415) Pulse Rate: 62 (02/04 0415)  Labs: Recent Labs    06/25/24 0117 06/25/24 0505 06/26/24 0237 06/26/24 1118 06/27/24 0231  HGB 8.0*  --  7.7*  --  7.4*  HCT 26.8*  --  26.0*  --  25.5*  PLT 139*  --  127*  --  151  LABPROT 21.1*  --  20.0*  --   --   INR 1.7*  --  1.6*  --   --   HEPARINUNFRC 1.00* 0.37 0.48  --  0.44  CREATININE 1.48*  --   --  1.32* 1.41*    Estimated Creatinine Clearance: 31.6 mL/min (A) (by C-G formula based on SCr of 1.41 mg/dL (H)).  Assessment: Abigail Oliver is a 79 y.o. year old female presented on 06/20/2024 with concern for CP and ACS. On warfarin prior to admission for mAVR. Prior to admission regimen, warfarin 1.5mg  STTh and 3mg  all other days (Last dose prior to admission 1/27 afternoon). Pharmacy consulted for heparin  management.   Heparin  level remains therapeutic at 0.44, on heparin  infusion at 1300 units/hr. INR is subtherapeutic at 1.6 with warfarin on hold. Hgb down to 7.4, plt 151. No s/sx of bleeding or infusion issues. No bleeding seen on EGD 2/3.  Plan for cardiac cath today.  Goal of Therapy:  INR goal: 2.5-3.5 Heparin  level 0.3-0.7 units/ml Monitor platelets by anticoagulation protocol: Yes   Plan:  Continue heparin  infusion at 1300 units/hr Daily heparin  level, CBC  Continue to monitor H&H, platelets, and s/sx bleeding F/u after cardiac cath (scheduled 06/27/24) - will determine plan for warfarin restart after   Thank you for involving pharmacy in this patient's care.  [employer], Pharm.D., BCPS Clinical Pharmacist Clinical phone for 06/27/2024 from 7:30-3:00 is  [phone].  **Pharmacist phone directory can be found on [email or web address] listed under [facility].  06/27/2024 8:00 AM        [1]  Allergies Allergen Reactions   Atorvastatin      Muscle pain   Gabapentin  Other (See Comments)    Stomach issues   Simvastatin  Other (See Comments)    Muscle pain   Sulfa  Antibiotics     unknown   Farxiga [Dapagliflozin] Nausea Only and Rash   Iodinated Contrast Media Rash     Red rash after cardiac cath 1 wk ago, ? Contrast allergy, requires 13 hr prep now per [email or web address]

## 2024-06-27 NOTE — Progress Notes (Signed)
 " Progress Note   Patient: Abigail Oliver FMW:[id number] DOB: 24-Jan-1946 DOA: 06/20/2024     6 DOS: the patient was seen and examined on 06/27/2024   Brief hospital course: 79 y.o. female with medical history significant of PAD, anxiety aortic valve replacement on chronic Coumadin , CKD 3B, DM2 HTN, HLD glaucoma, osteoporosis, diastolic CHF, CAD with prior CABG and PCI carotid disease     Presented with   generalized fatigue and chest pain Patient presents with cough and congestion for the past 2 days chills, orthopnea Had significant chest pain noted to have elevated troponin 351-700 cardiology has been consulted She was started on heparin  repeat troponin 800 CXR showed pulmonary congestion She does have hx of PAD and CAD and diastolic CHF Cardiology felt her symptoms were due to CHF exacerbation and rec diuresis   Patient states that for the past few weeks she has been progressively getting lightheaded fatigued having trouble walking and then for the past 24 hours has been having more severe chest pain feeling like something is crushing a big weight sitting on her chest her abdomen has been feeling distended and she has been having harder time breathing no significant leg swelling she was noted to be hypoxic in the emergency department down to 83% and started on 3 L FiO2 In the emergency department her symptoms have improved after getting IV Lasix      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 Court in the past for claudication in her right leg she is status post right SFA intervention in 2013 She has significant PAD with right ABI of 0.81 May view in 2025 was nonischemic   She does has history of aortic valve for which she takes Coumadin  and reports has been taking it denies any bleeding no black stools  02/04 plan for cardiac catheterization, PRBC order pre procedure for anemia.   Assessment and Plan: * Acute on chronic diastolic CHF (congestive heart failure)  (HCC) Echocardiogram with preserved LV systolic function with EF 55 to 60%, mild asymmetric left ventricular hypertrophy of the basal segment. Grade II diastolic dysfunction with pseudo normalization EA, RV systolic function with mild reduction, RVSP 55.2 mmHg, LA with mild dilatation, RA with mild to moderate dilatation, no pericardial effusion, mild to moderate mitral valve regurgitation, sp aortic valve replacement, (St Jude mechanical valve),   Documented urine output is 600 cc Systolic blood pressure 120 to 130 mmHg.   Plan to continue diuresis with furosemide  40 mg IV bid Continue metoprolol  succinate Limited medical therapy due to reduced GFR.  Plan for one unit PRBC for hgb less than 8, will add one extra dose of furosemide  60 mg x1 post transfusion.   Acute hypoxemic respiratory failure due to acute cardiogenic pulmonary edema 02 saturation today 98% on 4 L.min per [place] Plan to continue diuresis   CAD (coronary artery disease) NSTEMI.  Patient placed on IV heparin  for anticoagulation Continue asa and statin Metoprolol  succinate.  Further work up with cardiac catheterization Plan for PRBC transfusion today due to hgb less than 8  Pre cath protocol for contrast allergy   Essential hypertension Continue blood pressure monitoring Continue metoprolol  succinate Aggressive diuresis   CKD stage 3b, GFR 30-44 ml/min (HCC) Hypokalemia   Renal function today with serum cr at 1.41 with K at 4.2 and serum bicarbonate at 22 Na 139  Plan to continue diuresis and follow up renal function in am Avoid hypotension or nephrotoxic medications   Type  2 diabetes mellitus with hyperlipidemia (HCC) Continue glucose cover and monitoring with insulin  sliding scale Fasting glucose today 104 mg/dl  Continue statin therapy   Chronic anemia Positive iron  deficiency with serum iron  at 32, ferritin 136, TIBC 349 and transferrin saturation 9 Follow up hgb is 7.4  02/03 EGD with normal stomach and  normal examined duodenum. Recommendations for pantoprazole  bid with meals. Ok for anticoagulation. Will plan for IV iron  during this hospitalization     PVD (peripheral vascular disease) Continue with aspirin  and statin   Anxiety Continue with alprazolam  and sertraline          Subjective: Patient with dyspnea, no chest pain, no lower extremity edema   Physical Exam: Vitals:   06/26/24 2021 06/27/24 0024 06/27/24 0415 06/27/24 0801  BP: (!) 127/53 (!) 113/44 (!) 113/45 (!) 132/51  Pulse: 72 65 62   Resp: 18 18 18 17   Temp: 98 F (36.7 C) 97.8 F (36.6 C) 98 F (36.7 C) 97.8 F (36.6 C)  TempSrc: Oral Oral Oral Oral  SpO2: 96% 98% 94%   Weight:   76.9 kg   Height:       Neurology awake and alert, deconditioned ENT with mild pallor with no icterus Cardiovascular with S1 and S2 present and regular with no gallops, or rubs, no murmurs Mild JVD Respiratory with positive rales bilaterally with no wheezing or rhonchi Abdomen with no distention  Trace lower extremity edema  Data Reviewed:    Family Communication: no family at the bedside   Disposition: Status is: Inpatient Remains inpatient appropriate because: cardiac workup   Planned Discharge Destination: Home     Author: Elidia Toribio Furnace, MD 06/27/2024 8:45 AM  For on call review [email or web address].  "

## 2024-06-27 NOTE — Progress Notes (Signed)
 Subjective: No acute events.  She reported issues with a GERD sensation after the EGD yesterday.  Objective: Vital signs in last 24 hours: Temp:  [97 F (36.1 C)-98 F (36.7 C)] 97.7 F (36.5 C) (02/04 1145) Pulse Rate:  [59-72] 62 (02/04 0415) Resp:  [17-25] 17 (02/04 1145) BP: (98-132)/(43-81) 132/55 (02/04 1145) SpO2:  [94 %-98 %] 94 % (02/04 0415) Weight:  [76.9 kg] 76.9 kg (02/04 0415) Last BM Date : 06/26/24  Intake/Output from previous day: 02/03 0701 - 02/04 0700 In: 890 [P.O.:240; I.V.:150; NG/GT:500] Out: 600 [Urine:600] Intake/Output this shift: No intake/output data recorded.  General appearance: no distress GI: soft, non-tender; bowel sounds normal; no masses,  no organomegaly  Lab Results: Recent Labs    06/26/24 0237 06/27/24 0231 06/27/24 0839  WBC 4.5 5.3 4.8  HGB 7.7* 7.4* 8.3*  HCT 26.0* 25.5* 27.6*  PLT 127* 151 141*   BMET Recent Labs    06/25/24 0117 06/26/24 1118 06/27/24 0231  NA 139 140 139  K 4.1 4.2 4.2  CL 104 109 107  CO2 23 21* 22  GLUCOSE 140* 100* 104*  BUN 38* 41* 43*  CREATININE 1.48* 1.32* 1.41*  CALCIUM  8.4* 8.4* 8.2*   LFT No results for input(s): PROT, ALBUMIN, AST, ALT, ALKPHOS, BILITOT, BILIDIR, IBILI in the last 72 hours. PT/INR Recent Labs    06/26/24 0237 06/27/24 0836  LABPROT 20.0* 18.3*  INR 1.6* 1.4*   Hepatitis Panel No results for input(s): HEPBSAG, HCVAB, HEPAIGM, HEPBIGM in the last 72 hours. C-Diff No results for input(s): CDIFFTOX in the last 72 hours. Fecal Lactopherrin No results for input(s): FECLLACTOFRN in the last 72 hours.  Studies/Results: No results found.  Medications: Scheduled:  sodium chloride    Intravenous Once   aspirin  EC  81 mg Oral Daily   diphenhydrAMINE   50 mg Oral Once   Or   diphenhydrAMINE   50 mg Intravenous Once   furosemide   80 mg Intravenous Once   furosemide   80 mg Intravenous Once   [START ON 06/28/2024] furosemide   80 mg  Intravenous BID   insulin  aspart  0-9 Units Subcutaneous TID WC   latanoprost   1 drop Both Eyes QHS   methylPREDNISolone  (SOLU-MEDROL ) injection  40 mg Intravenous Q4H   metoprolol  succinate  50 mg Oral Daily   pantoprazole   40 mg Oral BID AC   rosuvastatin   20 mg Oral QPM   sertraline   150 mg Oral Daily   sodium chloride  flush  3 mL Intravenous Q12H   sodium chloride  flush  3 mL Intravenous Q12H   Continuous:  sodium chloride      heparin  1,300 Units/hr (06/26/24 1458)    Assessment/Plan: 1) GERD. 2) NSTEMI. 3) CHF. 4) Chronic anemia.   The patient remains stable.  She was not certain if the BID dosing of pantoprazole  was more effective.  At the of this visit she was aroused from sleep and she had a hard time assessing her symptoms.  The patient is to be transfused with 1 unit of PRBC.  Plan: 1) Agree with transfusion. 2) Cardiac cath today. 3) Maintain BID pantoprazole .   LOS: 6 days   Kyion Gautier D 06/27/2024, 12:02 PM

## 2024-06-27 NOTE — Progress Notes (Addendum)
 PHARMACY - ANTICOAGULATION CONSULT NOTE  Pharmacy Consult for IV heparin  and warfarin Indication: chest pain/ACS and mAVR  Allergies[1]  Patient Measurements: Height: 5' 2 (157.5 cm) Weight: 76.9 kg (169 lb 8.5 oz) IBW/kg (Calculated) : 50.1 HEPARIN  DW (KG): 66.5  Vital Signs: Temp: 96.9 F (36.1 C) (02/04 1530) Temp Source: Axillary (02/04 1530) BP: 101/68 (02/04 1530) Pulse Rate: 67 (02/04 1505)  Labs: Recent Labs    06/25/24 0117 06/25/24 0505 06/26/24 0237 06/26/24 1118 06/27/24 0231 06/27/24 0836 06/27/24 0839  HGB 8.0*  --  7.7*  --  7.4*  --  8.3*  HCT 26.8*  --  26.0*  --  25.5*  --  27.6*  PLT 139*  --  127*  --  151  --  141*  LABPROT 21.1*  --  20.0*  --   --  18.3*  --   INR 1.7*  --  1.6*  --   --  1.4*  --   HEPARINUNFRC 1.00* 0.37 0.48  --  0.44  --   --   CREATININE 1.48*  --   --  1.32* 1.41*  --   --     Estimated Creatinine Clearance: 31.6 mL/min (A) (by C-G formula based on SCr of 1.41 mg/dL (H)).  Assessment: Abigail Oliver is a 79 y.o. year old female presented on 06/20/2024 with concern for CP and ACS. On warfarin prior to admission for mAVR. Prior to admission regimen, warfarin 1.5mg  STTh and 3mg  all other days (Last dose prior to admission 1/27 afternoon). Pharmacy consulted for heparin  management.   S/p cath, restart heparin  2hr after TR band. INR 1.4, ok to restart warfarin without bridge. Per RN, TR band off at 19:05.   Goal of Therapy:  INR goal: 2.5-3.5 Heparin  level 0.3-0.7 units/ml Monitor platelets by anticoagulation protocol: Yes   Plan:  Restart heaprin 1300 units/hr at 21:15  Warfarin 4mg  x1  Monitor daily INR, daily heparin  level, signs/symptoms of bleeding    Thank you for involving pharmacy in this patient's care.  Jinnie Door, PharmD, BCPS, BCCP Clinical Pharmacist  Please check AMION for all [facility] Pharmacy phone numbers After 10:00 PM, call Main Pharmacy [phone]     [1]  Allergies Allergen Reactions    Atorvastatin      Muscle pain   Gabapentin  Other (See Comments)    Stomach issues   Simvastatin  Other (See Comments)    Muscle pain   Sulfa  Antibiotics     unknown   Farxiga [Dapagliflozin] Nausea Only and Rash   Iodinated Contrast Media Rash     Red rash after cardiac cath 1 wk ago, ? Contrast allergy, requires 13 hr prep now per [email or web address]

## 2024-06-27 NOTE — Interval H&P Note (Signed)
 History and Physical Interval Note:  06/27/2024 2:36 PM  Abigail Oliver  has presented today for surgery, with the diagnosis of nstemi.  The various methods of treatment have been discussed with the patient and family. After consideration of risks, benefits and other options for treatment, the patient has consented to  Procedures: RIGHT/LEFT HEART CATH AND CORONARY/GRAFT ANGIOGRAPHY (N/A) as a surgical intervention.  The patient's history has been reviewed, patient examined, no change in status, stable for surgery.  I have reviewed the patient's chart and labs.  Questions were answered to the patient's satisfaction.     [name]

## 2024-06-27 NOTE — Assessment & Plan Note (Signed)
 Hypokalemia   Renal function today with serum cr at 1.41 with K at 4.2 and serum bicarbonate at 22 Na 139  Plan to continue diuresis and follow up renal function in am Avoid hypotension or nephrotoxic medications

## 2024-06-27 NOTE — Assessment & Plan Note (Signed)
 Continue glucose cover and monitoring with insulin  sliding scale Fasting glucose today 104 mg/dl  Continue statin therapy

## 2024-06-27 NOTE — Hospital Course (Signed)
 79 y.o. female with medical history significant of PAD, anxiety aortic valve replacement on chronic Coumadin , CKD 3B, DM2 HTN, HLD glaucoma, osteoporosis, diastolic CHF, CAD with prior CABG and PCI carotid disease     Presented with   generalized fatigue and chest pain Patient presents with cough and congestion for the past 2 days chills, orthopnea Had significant chest pain noted to have elevated troponin [id number] cardiology has been consulted She was started on heparin  repeat troponin 800 CXR showed pulmonary congestion She does have hx of PAD and CAD and diastolic CHF Cardiology felt her symptoms were due to CHF exacerbation and rec diuresis   Patient states that for the past few weeks she has been progressively getting lightheaded fatigued having trouble walking and then for the past 24 hours has been having more severe chest pain feeling like something is crushing a big weight sitting on her chest her abdomen has been feeling distended and she has been having harder time breathing no significant leg swelling she was noted to be hypoxic in the emergency department down to 83% and started on 3 L FiO2 In the emergency department her symptoms have improved after getting IV Lasix      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 Court in the past for claudication in her right leg she is status post right SFA intervention in 2013 She has significant PAD with right ABI of 0.81 May view in 2025 was nonischemic   She does has history of aortic valve for which she takes Coumadin  and reports has been taking it denies any bleeding no black stools  02/04 plan for cardiac catheterization, PRBC order pre procedure for anemia.

## 2024-06-27 NOTE — Plan of Care (Signed)
   Problem: Fluid Volume: Goal: Ability to maintain a balanced intake and output will improve Outcome: Progressing

## 2024-06-27 NOTE — Progress Notes (Addendum)
 "  Rounding Note   Patient Name: Abigail Oliver Date of Encounter: 06/27/2024  [facility] Cardiologist: Darryle ONEIDA Decent, MD   Subjective Feeling more short of breath.  No chest pain.    Scheduled Meds:  sodium chloride    Intravenous Once   aspirin  EC  81 mg Oral Daily   diphenhydrAMINE   50 mg Oral Once   Or   diphenhydrAMINE   50 mg Intravenous Once   furosemide   80 mg Intravenous Once   furosemide   80 mg Intravenous Once   [START ON 06/28/2024] furosemide   80 mg Intravenous BID   insulin  aspart  0-9 Units Subcutaneous TID WC   latanoprost   1 drop Both Eyes QHS   methylPREDNISolone  (SOLU-MEDROL ) injection  40 mg Intravenous Q4H   metoprolol  succinate  50 mg Oral Daily   pantoprazole   40 mg Oral BID AC   rosuvastatin   20 mg Oral QPM   sertraline   150 mg Oral Daily   sodium chloride  flush  3 mL Intravenous Q12H   sodium chloride  flush  3 mL Intravenous Q12H   Continuous Infusions:  sodium chloride      heparin  1,300 Units/hr (06/26/24 1458)   PRN Meds: sodium chloride , acetaminophen  **OR** acetaminophen , ALPRAZolam , guaiFENesin -dextromethorphan , HYDROcodone -acetaminophen , ipratropium-albuterol , ondansetron  **OR** ondansetron  (ZOFRAN ) IV, sodium chloride  flush, sodium chloride  flush, traZODone    Vital Signs  Vitals:   06/26/24 2021 06/27/24 0024 06/27/24 0415 06/27/24 0801  BP: (!) 127/53 (!) 113/44 (!) 113/45 (!) 132/51  Pulse: 72 65 62   Resp: 18 18 18 17   Temp: 98 F (36.7 C) 97.8 F (36.6 C) 98 F (36.7 C) 97.8 F (36.6 C)  TempSrc: Oral Oral Oral Oral  SpO2: 96% 98% 94%   Weight:   76.9 kg   Height:        Intake/Output Summary (Last 24 hours) at 06/27/2024 1024 Last data filed at 06/27/2024 0528 Gross per 24 hour  Intake 890 ml  Output 600 ml  Net 290 ml      06/27/2024    4:15 AM 06/26/2024    5:59 AM 06/25/2024    3:23 AM  Last 3 Weights  Weight (lbs) 169 lb 8.5 oz 169 lb 15.6 oz 164 lb 10.9 oz  Weight (kg) 76.9 kg 77.1 kg 74.7 kg       Telemetry Sinus rhythm.  Sinus bradycardia.  - Personally Reviewed  ECG  N/a - Personally Reviewed  Physical Exam  VS:  BP (!) 132/51 (BP Location: Right Arm)   Pulse 62   Temp 97.8 F (36.6 C) (Oral)   Resp 17   Ht 5' 2 (1.575 m)   Wt 76.9 kg   SpO2 94%   BMI 31.01 kg/m  , BMI Body mass index is 31.01 kg/m. GENERAL:  Frail.  No acute distress.  HEENT: Pupils equal round and reactive, fundi not visualized, oral mucosa unremarkable NECK:  No jugular venous distention, waveform within normal limits, carotid upstroke brisk and symmetric, no bruits, no thyromegaly LUNGS: Poor air movement HEART:  RRR.  PMI not displaced or sustained,S1 within normal limits, mechanical S2. No S3, no S4, no clicks, no rubs, no murmurs ABD:  Flat, positive bowel sounds normal in frequency in pitch, no bruits, no rebound, no guarding, no midline pulsatile mass, no hepatomegaly, no splenomegaly EXT:  2 plus pulses throughout, no edema, no cyanosis no clubbing SKIN:  No rashes no nodules NEURO:  Cranial nerves II through XII grossly intact, motor grossly intact throughout PSYCH:  Cognitively  intact, oriented to person place and time  Labs High Sensitivity Troponin:  No results for input(s): TROPONINIHS in the last 720 hours.  Recent Labs  Lab 06/20/24 2034 06/20/24 2351 06/21/24 0100 06/21/24 0407 06/21/24 0656  TRNPT 809* 862* 857* 950* 1,096*       Chemistry Recent Labs  Lab 06/20/24 1600 06/21/24 0407 06/22/24 0618 06/25/24 0117 06/26/24 1118 06/27/24 0231  NA  --  138   < > 139 140 139  K  --  3.4*   < > 4.1 4.2 4.2  CL  --  104   < > 104 109 107  CO2  --  22   < > 23 21* 22  GLUCOSE  --  117*   < > 140* 100* 104*  BUN  --  34*   < > 38* 41* 43*  CREATININE  --  1.44*   < > 1.48* 1.32* 1.41*  CALCIUM   --  8.4*   < > 8.4* 8.4* 8.2*  MG 1.8 1.7  --   --   --   --   PROT  --  6.3*  --   --   --   --   ALBUMIN  --  3.4*  --   --   --   --   AST  --  58*  --   --   --   --    ALT  --  14  --   --   --   --   ALKPHOS  --  181*  --   --   --   --   BILITOT  --  1.4*  --   --   --   --   GFRNONAA  --  37*   < > 36* 41* 38*  ANIONGAP  --  12   < > 12 11 10    < > = values in this interval not displayed.    Lipids No results for input(s): CHOL, TRIG, HDL, LABVLDL, LDLCALC, CHOLHDL in the last 168 hours.  Hematology Recent Labs  Lab 06/25/24 0117 06/25/24 1730 06/26/24 0237 06/27/24 0231  WBC 4.8  --  4.5 5.3  RBC 3.10* 3.25* 2.98* 2.85*  HGB 8.0*  --  7.7* 7.4*  HCT 26.8*  --  26.0* 25.5*  MCV 86.5  --  87.2 89.5  MCH 25.8*  --  25.8* 26.0  MCHC 29.9*  --  29.6* 29.0*  RDW 17.3*  --  17.2* 17.4*  PLT 139*  --  127* 151   Thyroid  No results for input(s): TSH, FREET4 in the last 168 hours.  BNP No results for input(s): BNP, PROBNP in the last 168 hours.   DDimer No results for input(s): DDIMER in the last 168 hours.   Radiology  No results found.  Cardiac Studies Echo 06/20/24:  1. Left ventricular ejection fraction, by estimation, is 55 to 60%. The  left ventricle has normal function. The left ventricle has no regional  wall motion abnormalities. There is mild asymmetric left ventricular  hypertrophy of the basal-septal segment.  Left ventricular diastolic parameters are consistent with Grade II  diastolic dysfunction (pseudonormalization).   2. Right ventricular systolic function is mildly reduced. The right  ventricular size is normal. There is moderately elevated pulmonary artery  systolic pressure. The estimated right ventricular systolic pressure is  55.2 mmHg.   3. Left atrial size was mildly dilated.   4. Right atrial size was mild to moderately dilated.  5. There is no evidence of pericardial effusion.   6. The mitral valve is degenerative. Mild to moderate mitral valve  regurgitation. No evidence of mitral stenosis. Moderate mitral annular  calcification.   7. Gradient across aortic vavle prosthesis unchanged  from previous. The  aortic valve has been repaired/replaced. Aortic valve regurgitation is not  visualized. There is a St. Jude mechanical valve present in the aortic  position. Procedure Date: 10/1998.  Echo findings are consistent with normal structure and function of the  aortic valve prosthesis. Aortic valve area, by VTI measures 1.27 cm.  Aortic valve mean gradient measures 11.7 mmHg. Aortic valve Vmax measures  2.30 m/s.   8. The inferior vena cava is dilated in size with <50% respiratory  variability, suggesting right atrial pressure of 15 mmHg.    Patient Profile   79 y.o. female with CAD s/p CABG, PAD, HFpEF, s/p St. Jude mechanical AVR on warfarin, hyperlipidemia and CKD 3a here with NSTEMI.   Assessment & Plan   # NSTEMI:  # CAD s/p CABG:  # Hyperlipidemia:  History of CABG in 2020.  Now with shortness of breath and elevated high-sensitivity troponin to 1096.  H/H is downtrending.  Today it is down to 7.4 from 10.9 on admission.  Upper endoscopy revealed small hiatal hernia and possible esophagitis but no active bleeding.  Will transfuse 1unit pRBC to maintain hgb >8.  Planning for LHC/RHC today, likely diagnositic given her anemia.  She reports epigastric discomfort.  Last upper and lower endoscopy was in 2014 at which time she was found to have gastritis and hemorrhoids.  She denies melena or hematochezia.  Given that she has a need for anticoagulation with warfarin and aspirin  and would need clopidogrel  if she was found to have obstructive coronary disease on cath, she is going for EGD today.  Plan for [facility] tomorrow if h/h stable and OK per GI.  They do not feel she is stable for colonoscopy.   Informed Consent   Shared Decision Making/Informed Consent The risks [stroke (1 in 1000), death (1 in 1000), kidney failure [usually temporary] (1 in 500), bleeding (1 in 200), allergic reaction [possibly serious] (1 in 200)], benefits (diagnostic support and management of coronary artery  disease) and alternatives of a cardiac catheterization were discussed in detail with [name] and she is willing to proceed.      # HFpEF:  Mild pulmonary edema on chest x-ray.  Echo this admission revealed normal systolic function with mild asymmetric LVH and grade 2 diastolic dysfunction.  Right atrial pressure was 15 mmHg.  Continue IV Lasix .  Will increase to 80mg  IV bid.  Diuresis is slow and she reports increased SOB. She will benefit from a right heart cath at the time of her left heart cath.  # Mechanical AVR:  Warfarin on hold.  She is on heparin .  Gradient stable on echo this admission.  Which check blood smear, haptoglobin and LDH given her worsening anemia without clear source of GI bleed.    For questions or updates, please contact [facility] Please consult [email or web address] for contact info under       Signed, Annabella Scarce, MD  06/27/2024, 10:24 AM    "

## 2024-06-28 ENCOUNTER — Encounter (HOSPITAL_COMMUNITY): Payer: Self-pay | Admitting: Cardiovascular Disease

## 2024-06-28 DIAGNOSIS — I214 Non-ST elevation (NSTEMI) myocardial infarction: Secondary | ICD-10-CM | POA: Diagnosis not present

## 2024-06-28 DIAGNOSIS — I5033 Acute on chronic diastolic (congestive) heart failure: Secondary | ICD-10-CM | POA: Diagnosis not present

## 2024-06-28 DIAGNOSIS — N1832 Chronic kidney disease, stage 3b: Secondary | ICD-10-CM | POA: Diagnosis not present

## 2024-06-28 DIAGNOSIS — I5021 Acute systolic (congestive) heart failure: Secondary | ICD-10-CM

## 2024-06-28 DIAGNOSIS — I10 Essential (primary) hypertension: Secondary | ICD-10-CM | POA: Diagnosis not present

## 2024-06-28 DIAGNOSIS — I25119 Atherosclerotic heart disease of native coronary artery with unspecified angina pectoris: Secondary | ICD-10-CM | POA: Diagnosis not present

## 2024-06-28 LAB — TYPE AND SCREEN
ABO/RH(D): O POS
Antibody Screen: NEGATIVE
Unit division: 0

## 2024-06-28 LAB — GLUCOSE, CAPILLARY
Glucose-Capillary: 124 mg/dL — ABNORMAL HIGH (ref 70–99)
Glucose-Capillary: 156 mg/dL — ABNORMAL HIGH (ref 70–99)
Glucose-Capillary: 164 mg/dL — ABNORMAL HIGH (ref 70–99)
Glucose-Capillary: 113 mg/dL — ABNORMAL HIGH (ref 70–99)

## 2024-06-28 LAB — CBC
HCT: 28.9 % — ABNORMAL LOW (ref 36.0–46.0)
Hemoglobin: 8.8 g/dL — ABNORMAL LOW (ref 12.0–15.0)
MCH: 26.3 pg (ref 26.0–34.0)
MCHC: 30.4 g/dL (ref 30.0–36.0)
MCV: 86.5 fL (ref 80.0–100.0)
Platelets: 143 10*3/uL — ABNORMAL LOW (ref 150–400)
RBC: 3.34 MIL/uL — ABNORMAL LOW (ref 3.87–5.11)
RDW: 17 % — ABNORMAL HIGH (ref 11.5–15.5)
WBC: 4.6 10*3/uL (ref 4.0–10.5)
nRBC: 0 % (ref 0.0–0.2)

## 2024-06-28 LAB — BPAM RBC
Blood Product Expiration Date: 202602242359
ISSUE DATE / TIME: 202602041242
Unit Type and Rh: 5100

## 2024-06-28 LAB — BASIC METABOLIC PANEL WITH GFR
Anion gap: 12 (ref 5–15)
BUN: 42 mg/dL — ABNORMAL HIGH (ref 8–23)
CO2: 25 mmol/L (ref 22–32)
Calcium: 8.5 mg/dL — ABNORMAL LOW (ref 8.9–10.3)
Chloride: 105 mmol/L (ref 98–111)
Creatinine, Ser: 1.42 mg/dL — ABNORMAL HIGH (ref 0.44–1.00)
GFR, Estimated: 38 mL/min — ABNORMAL LOW
Glucose, Bld: 115 mg/dL — ABNORMAL HIGH (ref 70–99)
Potassium: 4.2 mmol/L (ref 3.5–5.1)
Sodium: 141 mmol/L (ref 135–145)

## 2024-06-28 LAB — HAPTOGLOBIN: Haptoglobin: 15 mg/dL — ABNORMAL LOW (ref 42–346)

## 2024-06-28 LAB — HEPARIN LEVEL (UNFRACTIONATED): Heparin Unfractionated: 0.28 [IU]/mL — ABNORMAL LOW (ref 0.30–0.70)

## 2024-06-28 LAB — PROTIME-INR
INR: 1.4 — ABNORMAL HIGH (ref 0.8–1.2)
Prothrombin Time: 18.1 s — ABNORMAL HIGH (ref 11.4–15.2)

## 2024-06-28 LAB — MAGNESIUM: Magnesium: 2.1 mg/dL (ref 1.7–2.4)

## 2024-06-28 MED ORDER — METOLAZONE 5 MG PO TABS
5.0000 mg | ORAL_TABLET | Freq: Once | ORAL | Status: AC
  Administered 2024-06-28: 5 mg via ORAL
  Filled 2024-06-28: qty 1

## 2024-06-28 MED ORDER — WARFARIN SODIUM 2.5 MG PO TABS
2.5000 mg | ORAL_TABLET | Freq: Once | ORAL | Status: AC
  Administered 2024-06-28: 2.5 mg via ORAL
  Filled 2024-06-28: qty 1

## 2024-06-28 MED ORDER — IRON SUCROSE 200 MG IVPB - SIMPLE MED
200.0000 mg | Freq: Once | Status: AC
  Administered 2024-06-28: 200 mg via INTRAVENOUS
  Filled 2024-06-28: qty 200

## 2024-06-28 NOTE — Progress Notes (Signed)
 Mobility Specialist: Progress Note   06/28/24 1100  Mobility  Activity Ambulated with assistance  Level of Assistance Contact guard assist, steadying assist  Assistive Device Four point cane  Distance Ambulated (ft) 150 ft  Activity Response Tolerated well  Mobility Referral Yes  Mobility visit 1 Mobility  Mobility Specialist Start Time (ACUTE ONLY) [id number]  Mobility Specialist Stop Time (ACUTE ONLY) 1017  Mobility Specialist Time Calculation (min) (ACUTE ONLY) 19 min    Pt received in bed, agreeable to mobility session. MinG to assist steadying during ambulation. SpO2 94% on 3LO2. No complaints just fatigued by EOS. Returned to room. Left in bed on 2.5LO2, all needs met, call bell in reach. SpO2 92% 2.5LO2.   Ileana Lute Mobility Specialist Please contact via SecureChat or Rehab office at [phone]

## 2024-06-28 NOTE — Progress Notes (Addendum)
 PHARMACY - ANTICOAGULATION CONSULT NOTE  Pharmacy Consult for IV heparin  and warfarin Indication: chest pain/ACS and mAVR  Allergies[1]  Patient Measurements: Height: 5' 2 (157.5 cm) Weight: 76.9 kg (169 lb 8.5 oz) IBW/kg (Calculated) : 50.1 HEPARIN  DW (KG): 66.5  Vital Signs: Temp: 97.6 F (36.4 C) (02/05 0750) Temp Source: Oral (02/05 0750) BP: 122/55 (02/05 0750) Pulse Rate: 57 (02/05 0750)  Labs: Recent Labs    06/26/24 0237 06/26/24 1118 06/27/24 0231 06/27/24 0836 06/27/24 0839 06/27/24 1503 06/27/24 1507 06/28/24 0152  HGB 7.7*  --  7.4*  --  8.3* 9.5*  9.9* 9.5* 8.8*  HCT 26.0*  --  25.5*  --  27.6* 28.0*  29.0* 28.0* 28.9*  PLT 127*  --  151  --  141*  --   --  143*  LABPROT 20.0*  --   --  18.3*  --   --   --   --   INR 1.6*  --   --  1.4*  --   --   --   --   HEPARINUNFRC 0.48  --  0.44  --   --   --   --  0.28*  CREATININE  --  1.32* 1.41*  --   --   --   --  1.42*    Estimated Creatinine Clearance: 31.3 mL/min (A) (by C-G formula based on SCr of 1.42 mg/dL (H)).  Assessment: Abigail Oliver is a 79 y.o. year old female presented on 06/20/2024 with concern for CP and ACS. On warfarin prior to admission for mAVR. Prior to admission regimen, warfarin 1.5mg  STTh and 3mg  all other days (Last dose prior to admission 1/27 afternoon). Pharmacy consulted for heparin  management.   S/p cath finding chronic calcified CAD w/ no clear culprit likely demand ischemia. Heparin  level is subtherapeutic at 0.28, on 1300 units/hr. INR is subtherapeutic at 1.4. Hgb 8.8, plt 143. No s/sx of bleeding or infusion issues.  Goal of Therapy:  INR goal: 2.5-3.5 Heparin  level 0.3-0.7 units/ml Monitor platelets by anticoagulation protocol: Yes   Plan:  Warfarin 2.5 mg tonight (higher than PTA dosing) Increase heparin  infusion at 1350 units/hr to get into goal range   Monitor daily INR, daily heparin  level, signs/symptoms of bleeding   Thank you for allowing pharmacy to  participate in this patient's care,  Suzen Sour, PharmD, BCCCP Clinical Pharmacist  Phone: [phone] 06/28/2024 8:42 AM  Please check AMION for all [facility] Pharmacy phone numbers After 10:00 PM, call Main Pharmacy [phone]      [1]  Allergies Allergen Reactions   Atorvastatin      Muscle pain   Gabapentin  Other (See Comments)    Stomach issues   Simvastatin  Other (See Comments)    Muscle pain   Sulfa  Antibiotics     unknown   Farxiga [Dapagliflozin] Nausea Only and Rash   Iodinated Contrast Media Rash     Red rash after cardiac cath 1 wk ago, ? Contrast allergy, requires 13 hr prep now per [email or web address]

## 2024-06-28 NOTE — Care Management Obs Status (Signed)
 MEDICARE OBSERVATION STATUS NOTIFICATION   Patient Details  Name: Abigail Oliver MRN: [id number] Date of Birth: 09/24/1945   Medicare Observation Status Notification Given:  Yes    Vonzell Arrie Sharps 06/28/2024, 11:44 AM

## 2024-06-28 NOTE — Progress Notes (Addendum)
 CARDIAC REHAB PHASE I   Post MI/PCI education including restrictions, risk factors, exercise guidelines, antiplatelet therapy importance, MI booklet, NTG use, heart healthy diabetic diet, and CRP2 reviewed. All questions and concerns addressed. Will refer to  [facility] for CRP2. Patient states she's already ambulated x 2 today and has no c/o CP but does have DOE. HF navigator following. Will continue to follow for ambulatory needs.   2:40-3:10 Isaiah JAYSON Liverpool, RN BSN 06/28/2024 3:12 PM

## 2024-06-28 NOTE — Progress Notes (Signed)
 "  Rounding Note   Patient Name: Abigail Oliver Date of Encounter: 06/28/2024  [facility] Cardiologist: Darryle ONEIDA Decent, MD   Subjective Feeling much better today.  Breathing improving.     Scheduled Meds:  aspirin  EC  81 mg Oral Daily   furosemide   80 mg Intravenous Once   furosemide   80 mg Intravenous BID   insulin  aspart  0-9 Units Subcutaneous TID WC   latanoprost   1 drop Both Eyes QHS   metoprolol  succinate  50 mg Oral Daily   pantoprazole   40 mg Oral BID AC   rosuvastatin   20 mg Oral QPM   sertraline   150 mg Oral Daily   sodium chloride  flush  3 mL Intravenous Q12H   sodium chloride  flush  3 mL Intravenous Q12H   Warfarin - Pharmacist Dosing Inpatient   Does not apply q1600   Continuous Infusions:  sodium chloride      heparin  1,350 Units/hr (06/28/24 0939)   PRN Meds: sodium chloride , acetaminophen  **OR** acetaminophen , ALPRAZolam , guaiFENesin -dextromethorphan , HYDROcodone -acetaminophen , ipratropium-albuterol , ondansetron  **OR** ondansetron  (ZOFRAN ) IV, sodium chloride  flush, sodium chloride  flush, traZODone    Vital Signs  Vitals:   06/27/24 1920 06/27/24 2341 06/28/24 0708 06/28/24 0750  BP: 116/65 (!) 115/42  (!) 122/55  Pulse: 69 72  (!) 57  Resp: 20 19  18   Temp: 97.8 F (36.6 C) 97.8 F (36.6 C)  97.6 F (36.4 C)  TempSrc: Oral Oral  Oral  SpO2: 99% 97%  99%  Weight:   76.5 kg   Height:        Intake/Output Summary (Last 24 hours) at 06/28/2024 1031 Last data filed at 06/28/2024 0900 Gross per 24 hour  Intake 756.28 ml  Output 1750 ml  Net -993.72 ml      06/28/2024    7:08 AM 06/27/2024    4:15 AM 06/26/2024    5:59 AM  Last 3 Weights  Weight (lbs) 168 lb 9.6 oz 169 lb 8.5 oz 169 lb 15.6 oz  Weight (kg) 76.476 kg 76.9 kg 77.1 kg      Telemetry Sinus rhythm.  Sinus bradycardia.  - Personally Reviewed  ECG  N/a - Personally Reviewed  Physical Exam  VS:  BP (!) 122/55 (BP Location: Right Wrist)   Pulse (!) 57   Temp 97.6 F (36.4  C) (Oral)   Resp 18   Ht 5' 2 (1.575 m)   Wt 76.5 kg   SpO2 99%   BMI 30.84 kg/m  , BMI Body mass index is 30.84 kg/m. GENERAL:  Frail.  No acute distress.  HEENT: Pupils equal round and reactive, fundi not visualized, oral mucosa unremarkable NECK:  No jugular venous distention, waveform within normal limits, carotid upstroke brisk and symmetric, no bruits, no thyromegaly LUNGS: CTAB.  HEART:  RRR.  PMI not displaced or sustained,S1 within normal limits, mechanical S2. No S3, no S4, no clicks, no rubs, no murmurs ABD:  Flat, positive bowel sounds normal in frequency in pitch, no bruits, no rebound, no guarding, no midline pulsatile mass, no hepatomegaly, no splenomegaly EXT:  2 plus pulses throughout, no edema, no cyanosis no clubbing SKIN:  No rashes no nodules NEURO:  Cranial nerves II through XII grossly intact, motor grossly intact throughout PSYCH:  Cognitively intact, oriented to person place and time  Labs High Sensitivity Troponin:  No results for input(s): TROPONINIHS in the last 720 hours.  Recent Labs  Lab 06/20/24 2034 06/20/24 2351 06/21/24 0100 06/21/24 0407 06/21/24 0656  TRNPT  809* 862* 857* 950* 1,096*       Chemistry Recent Labs  Lab 06/26/24 1118 06/27/24 0231 06/27/24 1503 06/27/24 1507 06/28/24 0152  NA 140 139 137  138 143 141  K 4.2 4.2 4.3  4.5 4.6 4.2  CL 109 107  --   --  105  CO2 21* 22  --   --  25  GLUCOSE 100* 104*  --   --  115*  BUN 41* 43*  --   --  42*  CREATININE 1.32* 1.41*  --   --  1.42*  CALCIUM  8.4* 8.2*  --   --  8.5*  MG  --   --   --   --  2.1  GFRNONAA 41* 38*  --   --  38*  ANIONGAP 11 10  --   --  12    Lipids No results for input(s): CHOL, TRIG, HDL, LABVLDL, LDLCALC, CHOLHDL in the last 168 hours.  Hematology Recent Labs  Lab 06/27/24 0231 06/27/24 0839 06/27/24 1503 06/27/24 1507 06/28/24 0152  WBC 5.3 4.8  --   --  4.6  RBC 2.85* 3.18*  --   --  3.34*  HGB 7.4* 8.3* 9.5*  9.9* 9.5* 8.8*   HCT 25.5* 27.6* 28.0*  29.0* 28.0* 28.9*  MCV 89.5 86.8  --   --  86.5  MCH 26.0 26.1  --   --  26.3  MCHC 29.0* 30.1  --   --  30.4  RDW 17.4* 17.7*  --   --  17.0*  PLT 151 141*  --   --  143*   Thyroid  No results for input(s): TSH, FREET4 in the last 168 hours.  BNP No results for input(s): BNP, PROBNP in the last 168 hours.   DDimer No results for input(s): DDIMER in the last 168 hours.   Radiology  CARDIAC CATHETERIZATION Result Date: 06/27/2024   Prox LAD lesion is 30% stenosed.   Dist RCA lesion is 100% stenosed.   Ost RCA to Mid RCA lesion is 80% stenosed.   Dist LM to Ost LAD lesion is 50% stenosed.   Ost Cx to Prox Cx lesion is 30% stenosed.   Mid LAD lesion is 60% stenosed.   Dist LAD lesion is 60% stenosed. 1.  Moderate distal left main stenosis, patent proximal LAD stent with mild in-stent restenosis and borderline significant mid to distal LAD disease which is heavily calcified and tortuous.  Patent RIMA to right PDA. 2.  Left ventricular angiography was not performed.  EF was normal by echo. 3.  Right heart catheterization showed moderately elevated filling pressures, moderate pulmonary hypertension and normal cardiac output.  Prominent V waves on pulmonary wedge tracings suggestive of significant mitral regurgitation. RA: 13 mmHg RV: 65/5 mmHg PA: 60/16 with a mean of 36 mmHg PW: 24 mmHg with a V wave of 45. Cardiac output/cardiac index: 5.89/3.3. Recommendations: The patient has chronic calcified coronary artery disease with no clear culprit for elevated troponin which seems to be due to demand ischemia.  Recommend medical therapy for coronary artery disease. She is significantly volume overloaded with evidence of mitral regurgitation by hemodynamics.  Continue IV diuresis.    Cardiac Studies Echo 06/20/24:  1. Left ventricular ejection fraction, by estimation, is 55 to 60%. The  left ventricle has normal function. The left ventricle has no regional  wall motion  abnormalities. There is mild asymmetric left ventricular  hypertrophy of the basal-septal segment.  Left ventricular diastolic parameters  are consistent with Grade II  diastolic dysfunction (pseudonormalization).   2. Right ventricular systolic function is mildly reduced. The right  ventricular size is normal. There is moderately elevated pulmonary artery  systolic pressure. The estimated right ventricular systolic pressure is  55.2 mmHg.   3. Left atrial size was mildly dilated.   4. Right atrial size was mild to moderately dilated.   5. There is no evidence of pericardial effusion.   6. The mitral valve is degenerative. Mild to moderate mitral valve  regurgitation. No evidence of mitral stenosis. Moderate mitral annular  calcification.   7. Gradient across aortic vavle prosthesis unchanged from previous. The  aortic valve has been repaired/replaced. Aortic valve regurgitation is not  visualized. There is a St. Jude mechanical valve present in the aortic  position. Procedure Date: 10/1998.  Echo findings are consistent with normal structure and function of the  aortic valve prosthesis. Aortic valve area, by VTI measures 1.27 cm.  Aortic valve mean gradient measures 11.7 mmHg. Aortic valve Vmax measures  2.30 m/s.   8. The inferior vena cava is dilated in size with <50% respiratory  variability, suggesting right atrial pressure of 15 mmHg.    Patient Profile   79 y.o. female with CAD s/p CABG, PAD, HFpEF, s/p St. Jude mechanical AVR on warfarin, hyperlipidemia and CKD 3a here with NSTEMI.   Assessment & Plan   # Type 2 NSTEMI:  # CAD s/p CABG:  # Hyperlipidemia:  History of CABG in 2020.  Now with shortness of breath and elevated high-sensitivity troponin to 1096.  H/H is downtrending.  Today it is down to 7.4 from 10.9 on admission.  Upper endoscopy revealed small hiatal hernia and possible esophagitis but no active bleeding. Transfused 1unit pRBC to maintain hgb >8.   She  underwent left and right heart cath and was found to have stable CAD without any clear culprit.  Continue medical management.    # HFpEF:  # Mitral digitation:  Right heart cath revealed significant volume overload and concern for more significant mitral regurgitation than was noted on echo.  She is feeling better with more aggressive attempts at diuresis.  Will give a dose of metolazone  prior to her next dose of Lasix .  Though she is starting to feel better she was only net -1 L yesterday.  Hopefully we can transition to oral diuretics tomorrow.  # Mechanical AVR:  Warfarin on hold.  She is on heparin .  Gradient stable on echo this admission.  Which check blood smear, haptoglobin and LDH given her worsening anemia without clear source of GI bleed.    For questions or updates, please contact [facility] Please consult [email or web address] for contact info under       Signed, Annabella Scarce, MD  06/28/2024, 10:31 AM    "

## 2024-06-28 NOTE — Plan of Care (Signed)
" °  Problem: Education: Goal: Ability to describe self-care measures that may prevent or decrease complications (Diabetes Survival Skills Education) will improve Outcome: Progressing   Problem: Coping: Goal: Ability to adjust to condition or change in health will improve Outcome: Progressing   Problem: Clinical Measurements: Goal: Respiratory complications will improve Outcome: Progressing   Problem: Activity: Goal: Risk for activity intolerance will decrease Outcome: Progressing   "

## 2024-06-28 NOTE — Progress Notes (Incomplete)
 Heart Failure Nurse Navigator Progress Note  PCP: Medina-Vargas, Monina C, NP PCP-Cardiologist: *** Admission Diagnosis: *** Admitted from: ***  Presentation:   [name] presented with ***  ECHO/ LVEF: ***  Clinical Course:  Past Medical History:  Diagnosis Date   Anemia    Anxiety    Aortic stenosis    a. Now has St Jude mechanical aortic valve (6/00). b. Echo (6/15) with EF 60-65%, mechanical aortic valve with mean gradient 27 mmHg.   Arthritis    a. Possible c-spine arthritis with pain down left arm.     Carotid artery disease    a. Carotid dopplers (7/16) with 40-59% BICA stenosis.     Chronic angle-closure glaucoma(365.23)    Chronic diastolic CHF (congestive heart failure) (HCC)    a.  RHC (7/15) with mean RA 2, PA 22/11, mean PCWP 9, CI 3.79.   Contrast media allergy    Coronary atherosclerosis of native coronary artery    a. CABG at time of AVR in 6/00 with RIMA-RCA. b. Abnl nuc 11/2013 -> LHC (7/15) with patent RIMA-RCA, 80% mRCA, 80% pLAD with FFR 0.73, treated with DES to pLAD.   Depression    Essential hypertension    GERD (gastroesophageal reflux disease)    Hyperlipidemia    Low back pain    Neuromuscular disorder (HCC)    neuropathy   Peripheral vascular disease    a, H/o Right SFA stent. b. Peripheral arterial dopplers (7/16) with right SFA stent patent.     PONV (postoperative nausea and vomiting)    N&V   Type II diabetes mellitus (HCC)      Social History   Socioeconomic History   Marital status: Married    Spouse name: Fairy   Number of children: 2   Years of education: 14   Highest education level: Not on file  Occupational History    Comment: hair dresser  Tobacco Use   Smoking status: Never   Smokeless tobacco: Never  Vaping Use   Vaping status: Never Used  Substance and Sexual Activity   Alcohol  use: Not Currently   Drug use: Not Currently   Sexual activity: Never  Other Topics Concern   Not on file  Social History  Narrative   Pt is a high school graduate with 2 years of college. Married in 1967 she has 1 son born 44 and 1 daughter born 82 and 1 grandchild. 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      Caffeine use- 2-3 /day, soda, tea       Lives with husband one story home   Social Drivers of Health   Tobacco Use: Low Risk (06/26/2024)   Patient History    Smoking Tobacco Use: Never    Smokeless Tobacco Use: Never    Passive Exposure: Not on file  Financial Resource Strain: Low Risk (07/22/2023)   Overall Financial Resource Strain (CARDIA)    Difficulty of Paying Living Expenses: Not hard at all  Food Insecurity: No Food Insecurity (06/21/2024)   Epic    Worried About Radiation Protection Practitioner of Food in the Last Year: Never true    Ran Out of Food in the Last Year: Never true  Transportation Needs: No Transportation Needs (06/21/2024)   Epic    Lack of Transportation (Medical): No    Lack of Transportation (Non-Medical): No  Physical Activity: Inactive (07/22/2023)   Exercise Vital Sign    Days of Exercise per Week: 0  days    Minutes of Exercise per Session: 0 min  Stress: Stress Concern Present (07/22/2023)   [employer] of Occupational Health - Occupational Stress Questionnaire    Feeling of Stress : Very much  Social Connections: Socially Isolated (06/21/2024)   Social Connection and Isolation Panel    Frequency of Communication with Friends and Family: Once a week    Frequency of Social Gatherings with Friends and Family: Once a week    Attends Religious Services: Never    Database Administrator or Organizations: No    Attends Banker Meetings: Never    Marital Status: Married  Depression (PHQ2-9): Medium Risk (04/02/2024)   Depression (PHQ2-9)    PHQ-2 Score: 9  Alcohol  Screen: Not on file  Housing: Unknown (06/21/2024)   Epic    Unable to Pay for Housing in the Last Year: No    Number of Times Moved in the Last Year: Not on file    Homeless in the  Last Year: No  Utilities: Not At Risk (06/21/2024)   Epic    Threatened with loss of utilities: No  Health Literacy: Not on file   Education Assessment and Provision:  Detailed education and instructions provided on heart failure disease management including the following:  Signs and symptoms of Heart Failure When to call the physician Importance of daily weights Low sodium diet Fluid restriction Medication management Anticipated future follow-up appointments  Patient education given on each of the above topics.  Patient acknowledges understanding via teach back method and acceptance of all instructions.  Education Materials:  Living Better With Heart Failure Booklet, HF zone tool, & Daily Weight Tracker Tool.  Patient has scale at home: *** Patient has pill box at home: ***    High Risk Criteria for Readmission and/or Poor Patient Outcomes: Heart failure hospital admissions (last 6 months): ***  No Show rate: *** Difficult social situation: *** Demonstrates medication adherence: *** Primary Language: *** Literacy level: ***  Barriers of Care:   ***  Considerations/Referrals:   Referral made to Heart Failure Pharmacist Stewardship: *** Referral made to Heart Failure CSW/NCM TOC: *** Referral made to Heart & Vascular TOC clinic: ***  Items for Follow-up on DC/TOC: ***   ***

## 2024-06-28 NOTE — Progress Notes (Signed)
 Subjective: She feels that she only has GERD issues with the hospital food.  Outside food does not induce her GERD symptoms.  Objective: Vital signs in last 24 hours: Temp:  [96.9 F (36.1 C)-97.8 F (36.6 C)] 97.7 F (36.5 C) (02/05 1119) Pulse Rate:  [55-72] 55 (02/05 1119) Resp:  [16-31] 18 (02/05 1119) BP: (68-144)/(13-128) 126/50 (02/05 1119) SpO2:  [79 %-100 %] 96 % (02/05 1119) Weight:  [76.5 kg] 76.5 kg (02/05 0708) Last BM Date : 06/27/24  Intake/Output from previous day: 02/04 0701 - 02/05 0700 In: 756.3 [P.O.:240; I.V.:121.6; Blood:394.7] Out: 1750 [Urine:1750] Intake/Output this shift: Total I/O In: 237 [P.O.:237] Out: 700 [Urine:700]  General appearance: alert and no distress GI: soft, non-tender; bowel sounds normal; no masses,  no organomegaly  Lab Results: Recent Labs    06/27/24 0231 06/27/24 0839 06/27/24 1503 06/27/24 1507 06/28/24 0152  WBC 5.3 4.8  --   --  4.6  HGB 7.4* 8.3* 9.5*  9.9* 9.5* 8.8*  HCT 25.5* 27.6* 28.0*  29.0* 28.0* 28.9*  PLT 151 141*  --   --  143*   BMET Recent Labs    06/26/24 1118 06/27/24 0231 06/27/24 1503 06/27/24 1507 06/28/24 0152  NA 140 139 137  138 143 141  K 4.2 4.2 4.3  4.5 4.6 4.2  CL 109 107  --   --  105  CO2 21* 22  --   --  25  GLUCOSE 100* 104*  --   --  115*  BUN 41* 43*  --   --  42*  CREATININE 1.32* 1.41*  --   --  1.42*  CALCIUM  8.4* 8.2*  --   --  8.5*   LFT No results for input(s): PROT, ALBUMIN, AST, ALT, ALKPHOS, BILITOT, BILIDIR, IBILI in the last 72 hours. PT/INR Recent Labs    06/27/24 0836 06/28/24 0910  LABPROT 18.3* 18.1*  INR 1.4* 1.4*   Hepatitis Panel No results for input(s): HEPBSAG, HCVAB, HEPAIGM, HEPBIGM in the last 72 hours. C-Diff No results for input(s): CDIFFTOX in the last 72 hours. Fecal Lactopherrin No results for input(s): FECLLACTOFRN in the last 72 hours.  Studies/Results: CARDIAC CATHETERIZATION Result Date: 06/27/2024    Prox LAD lesion is 30% stenosed.   Dist RCA lesion is 100% stenosed.   Ost RCA to Mid RCA lesion is 80% stenosed.   Dist LM to Ost LAD lesion is 50% stenosed.   Ost Cx to Prox Cx lesion is 30% stenosed.   Mid LAD lesion is 60% stenosed.   Dist LAD lesion is 60% stenosed. 1.  Moderate distal left main stenosis, patent proximal LAD stent with mild in-stent restenosis and borderline significant mid to distal LAD disease which is heavily calcified and tortuous.  Patent RIMA to right PDA. 2.  Left ventricular angiography was not performed.  EF was normal by echo. 3.  Right heart catheterization showed moderately elevated filling pressures, moderate pulmonary hypertension and normal cardiac output.  Prominent V waves on pulmonary wedge tracings suggestive of significant mitral regurgitation. RA: 13 mmHg RV: 65/5 mmHg PA: 60/16 with a mean of 36 mmHg PW: 24 mmHg with a V wave of 45. Cardiac output/cardiac index: 5.89/3.3. Recommendations: The patient has chronic calcified coronary artery disease with no clear culprit for elevated troponin which seems to be due to demand ischemia.  Recommend medical therapy for coronary artery disease. She is significantly volume overloaded with evidence of mitral regurgitation by hemodynamics.  Continue IV diuresis.  Medications: Scheduled:  aspirin  EC  81 mg Oral Daily   furosemide   80 mg Intravenous Once   furosemide   80 mg Intravenous BID   insulin  aspart  0-9 Units Subcutaneous TID WC   latanoprost   1 drop Both Eyes QHS   metolazone   5 mg Oral Once   metoprolol  succinate  50 mg Oral Daily   pantoprazole   40 mg Oral BID AC   rosuvastatin   20 mg Oral QPM   sertraline   150 mg Oral Daily   sodium chloride  flush  3 mL Intravenous Q12H   sodium chloride  flush  3 mL Intravenous Q12H   warfarin  2.5 mg Oral ONCE-1600   Warfarin - Pharmacist Dosing Inpatient   Does not apply q1600   Continuous:  sodium chloride      heparin  1,350 Units/hr (06/28/24 0939)     Assessment/Plan: 1) GERD - stable. 2) CAD - stable disease. 3) Chronic anemia.   From the GI standpoint she is stable.  Interestingly outside food does not cause her GERD while on BID pantoprazole .  Cardiology recommends medical management for her CAD and her HGB remains stable in the 8 range.  Plan: 1) Continue with pantoprazole  BID. 2) Upon discharge follow up with Dr. Kristie.   LOS: 7 days   Nimco Bivens D 06/28/2024, 1:14 PM

## 2024-06-28 NOTE — Progress Notes (Addendum)
 " PROGRESS NOTE    Abigail Oliver  FMW:[id number] DOB: 1945-06-27 DOA: 06/20/2024 PCP: [name], NP    Chief Complaint  Patient presents with   Shortness of Breath   Nausea   Chills    Brief Narrative:  Abigail Oliver is a 79 year old female with past medical history of essential hypertension, type 2 diabetes mellitus, hyperlipidemia, peripheral vascular disease, carotid artery disease, CKD stage IIIb, chronic diastolic CHF, depression with anxiety, chronic angle-closure glaucoma, aortic valve disease with replacement, iron  deficiency anemia, CABG, angina pectoris, lumbar stenosis, and insomnia. She presented to the ED on 1/28 after experiencing shortness of breath, chills, nausea, dizziness, and chest heaviness the day prior. She notes feeling worse while lying flat and at night. She denies sick contacts. In the ED, her SpO2 was 83% on room air, RR 24. She was found to have acute on chronic diastolic CHF.    Assessment & Plan: Acute on Chronic Diastolic HFpEF  Urine output 1750. SBP 92-144.  Chest x-ray shows cardiomegaly, atherosclerotic calcifications in aorta, pulmonary vascular congestion with interstitial edema, and Kerley B-lines in periphery.   Echocardiogram shows Grade II diastolic dysfunction with EF of 55-60%. There is mild asymmetric LVH, mild RVH, mild IVC dilation, and elevated pulmonary artery pressure.  Continue Lasix , metolazone , metoprolol .   Coronary Artery Disease Peripheral Vascular Disease NSTEMI Myocardial Infarction  ProBNP and Troponin very elevated on admission.  EKG on 1/28 shows atrial fibrillation, septal infarct, and ST depression in lateral leads.  Right Heart Catheterization shows diffuse stenosis/no clear culprit for elevated troponin. Stent not placed.    Continue medical therapy (statin, aspirin , heparin ).   Essential Hypertension SBP 92-144 Stable. Continue metoprolol .   CKD Stage IIIb  GFR 38, Creatinine 1.42, BUN 42   Stable at baseline.  Monitor.   Consider adding SGLT2 inhibitor for kidney protection.   Diabetes Mellitus, Type 2 CBG 156 Controlled. Stable.   Continue SSI inpatient.  Patient claims outpatient metformin  was discontinued. Start SGLT2 inhibitor.   Chronic Iron  Deficiency Anemia Chronic Thrombocytopenia Hgb 8.8 - baseline, MCV normal, Ferritin normal, Iron  32 - lower side Platelets 143 - baseline EGD shows no signs of acute GI bleed.  Start inpatient IV iron . Continue outpatient folate and iron  supplementation.  Transfuse if Hgb <7.   Depression with Anxiety  Continue daily Zoloft  and Xanax  PRN.     DVT prophylaxis: heparin   Code Status: Full  Family Communication: none at bedside.  Disposition:   Status is: Inpatient Remains inpatient appropriate because: severity of illness    Consultants:  Cardiology  Gastroenterology   Procedures:  Antimicrobials:   Subjective:  Patient is sitting upright in bed eating lunch. Appears in no acute distress. Able to walk around on nasal cannula. No chest pain, n/v, abdominal pain. Occasional SOB.   Objective: Vitals:   06/27/24 1530 06/27/24 1920 06/27/24 2341 06/28/24 0750  BP: 101/68 116/65 (!) 115/42 (!) 122/55  Pulse:  69 72 (!) 57  Resp:  20 19 18   Temp: (!) 96.9 F (36.1 C) 97.8 F (36.6 C) 97.8 F (36.6 C) 97.6 F (36.4 C)  TempSrc: Axillary Oral Oral Oral  SpO2: 95% 99% 97% 99%  Weight:      Height:        Intake/Output Summary (Last 24 hours) at 06/28/2024 0951 Last data filed at 06/28/2024 0641 Gross per 24 hour  Intake 756.28 ml  Output 1750 ml  Net -993.72 ml   Filed Weights   06/25/24  9676 06/26/24 0559 06/27/24 0415  Weight: 74.7 kg 77.1 kg 76.9 kg    Examination:  General exam: Appears calm and comfortable  Respiratory system: Clear to auscultation. Respiratory effort normal. Cardiovascular system: S1 & S2 heard, RRR. Mild JVD. No murmurs, rubs, or gallops. Aortic valve replacement click heard.  No pedal edema. Gastrointestinal system: Abdomen is nondistended, soft and nontender. No organomegaly or masses felt. Normal bowel sounds heard. Central nervous system: Alert and oriented. No focal neurological deficits. Extremities: Symmetric 5 x 5 power. Skin: No rashes, lesions or ulcers Psychiatry: Judgement and insight appear normal. Mood & affect appropriate.     Data Reviewed: I have personally reviewed following labs and imaging studies.  CBC: Recent Labs  Lab 06/25/24 0117 06/26/24 0237 06/27/24 0231 06/27/24 0839 06/27/24 1503 06/27/24 1507 06/28/24 0152  WBC 4.8 4.5 5.3 4.8  --   --  4.6  NEUTROABS  --   --   --  4.0  --   --   --   HGB 8.0* 7.7* 7.4* 8.3* 9.5*  9.9* 9.5* 8.8*  HCT 26.8* 26.0* 25.5* 27.6* 28.0*  29.0* 28.0* 28.9*  MCV 86.5 87.2 89.5 86.8  --   --  86.5  PLT 139* 127* 151 141*  --   --  143*    Basic Metabolic Panel: Recent Labs  Lab 06/23/24 0216 06/25/24 0117 06/26/24 1118 06/27/24 0231 06/27/24 1503 06/27/24 1507 06/28/24 0152  NA 141 139 140 139 137  138 143 141  K 4.0 4.1 4.2 4.2 4.3  4.5 4.6 4.2  CL 107 104 109 107  --   --  105  CO2 24 23 21* 22  --   --  25  GLUCOSE 109* 140* 100* 104*  --   --  115*  BUN 34* 38* 41* 43*  --   --  42*  CREATININE 1.39* 1.48* 1.32* 1.41*  --   --  1.42*  CALCIUM  8.5* 8.4* 8.4* 8.2*  --   --  8.5*  MG  --   --   --   --   --   --  2.1    GFR: Estimated Creatinine Clearance: 31.3 mL/min (A) (by C-G formula based on SCr of 1.42 mg/dL (H)).  Liver Function Tests: No results for input(s): AST, ALT, ALKPHOS, BILITOT, PROT, ALBUMIN in the last 168 hours.  CBG: Recent Labs  Lab 06/27/24 0604 06/27/24 1144 06/27/24 1639 06/28/24 0533 06/28/24 0913  GLUCAP 101* 178* 139* 124* 156*     Recent Results (from the past 240 hours)  Resp panel by RT-PCR (RSV, Flu A&B, Covid) Anterior Nasal Swab     Status: None   Collection Time: 06/20/24  9:08 AM   Specimen: Anterior Nasal Swab   Result Value Ref Range Status   SARS Coronavirus 2 by RT PCR NEGATIVE NEGATIVE Final   Influenza A by PCR NEGATIVE NEGATIVE Final   Influenza B by PCR NEGATIVE NEGATIVE Final    Comment: (NOTE) The Xpert Xpress SARS-CoV-2/FLU/RSV plus assay is intended as an aid in the diagnosis of influenza from Nasopharyngeal swab specimens and should not be used as a sole basis for treatment. Nasal washings and aspirates are unacceptable for Xpert Xpress SARS-CoV-2/FLU/RSV testing.  Fact Sheet for Patients: bloggercourse.com  Fact Sheet for Healthcare Providers: seriousbroker.it  This test is not yet approved or cleared by the [place]  FDA and has been authorized for detection and/or diagnosis of SARS-CoV-2 by FDA under an Emergency Use Authorization (EUA).  This EUA will remain in effect (meaning this test can be used) for the duration of the COVID-19 declaration under Section 564(b)(1) of the Act, 21 U.S.C. section 360bbb-3(b)(1), unless the authorization is terminated or revoked.     Resp Syncytial Virus by PCR NEGATIVE NEGATIVE Final    Comment: (NOTE) Fact Sheet for Patients: bloggercourse.com  Fact Sheet for Healthcare Providers: seriousbroker.it  This test is not yet approved or cleared by the [place]  FDA and has been authorized for detection and/or diagnosis of SARS-CoV-2 by FDA under an Emergency Use Authorization (EUA). This EUA will remain in effect (meaning this test can be used) for the duration of the COVID-19 declaration under Section 564(b)(1) of the Act, 21 U.S.C. section 360bbb-3(b)(1), unless the authorization is terminated or revoked.  Performed at [facility], [address] [place], [place] [address]      Radiology Studies: CARDIAC CATHETERIZATION Result Date: 06/27/2024   Prox LAD lesion is 30% stenosed.   Dist RCA lesion is 100% stenosed.    Ost RCA to Mid RCA lesion is 80% stenosed.   Dist LM to Ost LAD lesion is 50% stenosed.   Ost Cx to Prox Cx lesion is 30% stenosed.   Mid LAD lesion is 60% stenosed.   Dist LAD lesion is 60% stenosed. 1.  Moderate distal left main stenosis, patent proximal LAD stent with mild in-stent restenosis and borderline significant mid to distal LAD disease which is heavily calcified and tortuous.  Patent RIMA to right PDA. 2.  Left ventricular angiography was not performed.  EF was normal by echo. 3.  Right heart catheterization showed moderately elevated filling pressures, moderate pulmonary hypertension and normal cardiac output.  Prominent V waves on pulmonary wedge tracings suggestive of significant mitral regurgitation. RA: 13 mmHg RV: 65/5 mmHg PA: 60/16 with a mean of 36 mmHg PW: 24 mmHg with a V wave of 45. Cardiac output/cardiac index: 5.89/3.3. Recommendations: The patient has chronic calcified coronary artery disease with no clear culprit for elevated troponin which seems to be due to demand ischemia.  Recommend medical therapy for coronary artery disease. She is significantly volume overloaded with evidence of mitral regurgitation by hemodynamics.  Continue IV diuresis.    Scheduled Meds:  aspirin  EC  81 mg Oral Daily   furosemide   80 mg Intravenous Once   furosemide   80 mg Intravenous BID   insulin  aspart  0-9 Units Subcutaneous TID WC   latanoprost   1 drop Both Eyes QHS   metoprolol  succinate  50 mg Oral Daily   pantoprazole   40 mg Oral BID AC   rosuvastatin   20 mg Oral QPM   sertraline   150 mg Oral Daily   sodium chloride  flush  3 mL Intravenous Q12H   sodium chloride  flush  3 mL Intravenous Q12H   Warfarin - Pharmacist Dosing Inpatient   Does not apply q1600   Continuous Infusions:  sodium chloride      heparin  1,350 Units/hr (06/28/24 0939)     LOS: 7 days    Time spent:     Orland Born, Student PA [facility]   To contact the attending provider between 7A-7P or the  covering provider during after hours 7P-7A, please log into the web site [email or web address] and access using universal [place] password for that web site. If you do not have the password, please call the hospital operator.  06/28/2024, 9:51 AM   "

## 2024-06-29 ENCOUNTER — Other Ambulatory Visit (HOSPITAL_COMMUNITY): Payer: Self-pay

## 2024-06-29 ENCOUNTER — Telehealth: Payer: Self-pay | Admitting: Professional Counselor

## 2024-06-29 ENCOUNTER — Telehealth (HOSPITAL_COMMUNITY): Payer: Self-pay

## 2024-06-29 ENCOUNTER — Encounter (HOSPITAL_COMMUNITY): Payer: Self-pay | Admitting: Internal Medicine

## 2024-06-29 ENCOUNTER — Telehealth (HOSPITAL_COMMUNITY): Payer: Self-pay | Admitting: Pharmacy Technician

## 2024-06-29 ENCOUNTER — Telehealth (HOSPITAL_COMMUNITY): Payer: Self-pay | Admitting: Internal Medicine

## 2024-06-29 DIAGNOSIS — E781 Pure hyperglyceridemia: Secondary | ICD-10-CM

## 2024-06-29 DIAGNOSIS — Z0389 Encounter for observation for other suspected diseases and conditions ruled out: Secondary | ICD-10-CM

## 2024-06-29 LAB — CBC
HCT: 29.8 % — ABNORMAL LOW (ref 36.0–46.0)
Hemoglobin: 9.2 g/dL — ABNORMAL LOW (ref 12.0–15.0)
MCH: 26.4 pg (ref 26.0–34.0)
MCHC: 30.9 g/dL (ref 30.0–36.0)
MCV: 85.6 fL (ref 80.0–100.0)
Platelets: 136 10*3/uL — ABNORMAL LOW (ref 150–400)
RBC: 3.48 MIL/uL — ABNORMAL LOW (ref 3.87–5.11)
RDW: 17.2 % — ABNORMAL HIGH (ref 11.5–15.5)
WBC: 4.6 10*3/uL (ref 4.0–10.5)
nRBC: 0 % (ref 0.0–0.2)

## 2024-06-29 LAB — PROTIME-INR
INR: 1.5 — ABNORMAL HIGH (ref 0.8–1.2)
Prothrombin Time: 18.7 s — ABNORMAL HIGH (ref 11.4–15.2)

## 2024-06-29 LAB — BASIC METABOLIC PANEL WITH GFR
Anion gap: 11 (ref 5–15)
BUN: 40 mg/dL — ABNORMAL HIGH (ref 8–23)
CO2: 27 mmol/L (ref 22–32)
Calcium: 9.2 mg/dL (ref 8.9–10.3)
Chloride: 104 mmol/L (ref 98–111)
Creatinine, Ser: 1.54 mg/dL — ABNORMAL HIGH (ref 0.44–1.00)
GFR, Estimated: 34 mL/min — ABNORMAL LOW
Glucose, Bld: 93 mg/dL (ref 70–99)
Potassium: 3.7 mmol/L (ref 3.5–5.1)
Sodium: 142 mmol/L (ref 135–145)

## 2024-06-29 LAB — GLUCOSE, CAPILLARY
Glucose-Capillary: 91 mg/dL (ref 70–99)
Glucose-Capillary: 127 mg/dL — ABNORMAL HIGH (ref 70–99)
Glucose-Capillary: 180 mg/dL — ABNORMAL HIGH (ref 70–99)
Glucose-Capillary: 142 mg/dL — ABNORMAL HIGH (ref 70–99)

## 2024-06-29 LAB — HEPARIN LEVEL (UNFRACTIONATED): Heparin Unfractionated: 0.57 [IU]/mL (ref 0.30–0.70)

## 2024-06-29 MED ORDER — POTASSIUM CHLORIDE CRYS ER 20 MEQ PO TBCR
40.0000 meq | EXTENDED_RELEASE_TABLET | Freq: Once | ORAL | Status: AC
  Administered 2024-06-29: 40 meq via ORAL
  Filled 2024-06-29: qty 2

## 2024-06-29 MED ORDER — WARFARIN SODIUM 5 MG PO TABS
5.0000 mg | ORAL_TABLET | Freq: Once | ORAL | Status: AC
  Administered 2024-06-29: 5 mg via ORAL
  Filled 2024-06-29: qty 1

## 2024-06-29 NOTE — Progress Notes (Addendum)
 "  Rounding Note   Patient Name: Abigail Oliver Date of Encounter: 06/29/2024  [facility] Cardiologist: Darryle ONEIDA Decent, MD   Subjective Feeling much better today.  Breathing improving.   She feels like she can take a deep breath and her breathing is back to baseline.  Scheduled Meds:  aspirin  EC  81 mg Oral Daily   furosemide   80 mg Intravenous Once   furosemide   80 mg Intravenous BID   insulin  aspart  0-9 Units Subcutaneous TID WC   latanoprost   1 drop Both Eyes QHS   metoprolol  succinate  50 mg Oral Daily   pantoprazole   40 mg Oral BID AC   rosuvastatin   20 mg Oral QPM   sertraline   150 mg Oral Daily   sodium chloride  flush  3 mL Intravenous Q12H   sodium chloride  flush  3 mL Intravenous Q12H   warfarin  5 mg Oral ONCE-1600   Warfarin - Pharmacist Dosing Inpatient   Does not apply q1600   Continuous Infusions:  heparin  1,350 Units/hr (06/29/24 0724)   PRN Meds: acetaminophen  **OR** acetaminophen , ALPRAZolam , guaiFENesin -dextromethorphan , HYDROcodone -acetaminophen , ipratropium-albuterol , ondansetron  **OR** ondansetron  (ZOFRAN ) IV, sodium chloride  flush, sodium chloride  flush, traZODone    Vital Signs  Vitals:   06/28/24 1911 06/28/24 2317 06/29/24 0318 06/29/24 0805  BP: (!) 132/52 (!) 111/56 (!) 110/48 (!) 147/42  Pulse: 68 (!) 56 60 69  Resp: 18 19 19 18   Temp: 98.6 F (37 C) 98.5 F (36.9 C) 98.3 F (36.8 C) 98 F (36.7 C)  TempSrc: Oral Oral Oral Oral  SpO2: 95% 95% 97% 95%  Weight:      Height:        Intake/Output Summary (Last 24 hours) at 06/29/2024 1006 Last data filed at 06/29/2024 0848 Gross per 24 hour  Intake 1099.67 ml  Output 2450 ml  Net -1350.33 ml      06/28/2024    7:08 AM 06/27/2024    4:15 AM 06/26/2024    5:59 AM  Last 3 Weights  Weight (lbs) 168 lb 9.6 oz 169 lb 8.5 oz 169 lb 15.6 oz  Weight (kg) 76.476 kg 76.9 kg 77.1 kg      Telemetry Sinus rhythm.  Sinus bradycardia.  - Personally Reviewed  ECG  N/a - Personally  Reviewed  Physical Exam  VS:  BP (!) 147/42 (BP Location: Right Arm)   Pulse 69   Temp 98 F (36.7 C) (Oral)   Resp 18   Ht 5' 2 (1.575 m)   Wt 76.5 kg   SpO2 95%   BMI 30.84 kg/m  , BMI Body mass index is 30.84 kg/m. GENERAL:  Frail.  No acute distress.  HEENT: Pupils equal round and reactive, fundi not visualized, oral mucosa unremarkable NECK:  No jugular venous distention, waveform within normal limits, carotid upstroke brisk and symmetric, no bruits, no thyromegaly LUNGS: CTAB.  HEART:  RRR.  PMI not displaced or sustained,S1 within normal limits, mechanical S2. No S3, no S4, no clicks, no rubs, no murmurs ABD:  Flat, positive bowel sounds normal in frequency in pitch, no bruits, no rebound, no guarding, no midline pulsatile mass, no hepatomegaly, no splenomegaly EXT:  2 plus pulses throughout, no edema, no cyanosis no clubbing SKIN:  No rashes no nodules NEURO:  Cranial nerves II through XII grossly intact, motor grossly intact throughout PSYCH:  Cognitively intact, oriented to person place and time  Labs High Sensitivity Troponin:  No results for input(s): TROPONINIHS in the last 720  hours.  Recent Labs  Lab 06/20/24 2034 06/20/24 2351 06/21/24 0100 06/21/24 0407 06/21/24 0656  TRNPT 809* 862* 857* 950* 1,096*       Chemistry Recent Labs  Lab 06/27/24 0231 06/27/24 1503 06/27/24 1507 06/28/24 0152 06/29/24 0238  NA 139   < > 143 141 142  K 4.2   < > 4.6 4.2 3.7  CL 107  --   --  105 104  CO2 22  --   --  25 27  GLUCOSE 104*  --   --  115* 93  BUN 43*  --   --  42* 40*  CREATININE 1.41*  --   --  1.42* 1.54*  CALCIUM  8.2*  --   --  8.5* 9.2  MG  --   --   --  2.1  --   GFRNONAA 38*  --   --  38* 34*  ANIONGAP 10  --   --  12 11   < > = values in this interval not displayed.    Lipids No results for input(s): CHOL, TRIG, HDL, LABVLDL, LDLCALC, CHOLHDL in the last 168 hours.  Hematology Recent Labs  Lab 06/27/24 0839 06/27/24 1503  06/27/24 1507 06/28/24 0152 06/29/24 0238  WBC 4.8  --   --  4.6 4.6  RBC 3.18*  --   --  3.34* 3.48*  HGB 8.3*   < > 9.5* 8.8* 9.2*  HCT 27.6*   < > 28.0* 28.9* 29.8*  MCV 86.8  --   --  86.5 85.6  MCH 26.1  --   --  26.3 26.4  MCHC 30.1  --   --  30.4 30.9  RDW 17.7*  --   --  17.0* 17.2*  PLT 141*  --   --  143* 136*   < > = values in this interval not displayed.   Thyroid  No results for input(s): TSH, FREET4 in the last 168 hours.  BNP No results for input(s): BNP, PROBNP in the last 168 hours.   DDimer No results for input(s): DDIMER in the last 168 hours.   Radiology  CARDIAC CATHETERIZATION Result Date: 06/27/2024   Prox LAD lesion is 30% stenosed.   Dist RCA lesion is 100% stenosed.   Ost RCA to Mid RCA lesion is 80% stenosed.   Dist LM to Ost LAD lesion is 50% stenosed.   Ost Cx to Prox Cx lesion is 30% stenosed.   Mid LAD lesion is 60% stenosed.   Dist LAD lesion is 60% stenosed. 1.  Moderate distal left main stenosis, patent proximal LAD stent with mild in-stent restenosis and borderline significant mid to distal LAD disease which is heavily calcified and tortuous.  Patent RIMA to right PDA. 2.  Left ventricular angiography was not performed.  EF was normal by echo. 3.  Right heart catheterization showed moderately elevated filling pressures, moderate pulmonary hypertension and normal cardiac output.  Prominent V waves on pulmonary wedge tracings suggestive of significant mitral regurgitation. RA: 13 mmHg RV: 65/5 mmHg PA: 60/16 with a mean of 36 mmHg PW: 24 mmHg with a V wave of 45. Cardiac output/cardiac index: 5.89/3.3. Recommendations: The patient has chronic calcified coronary artery disease with no clear culprit for elevated troponin which seems to be due to demand ischemia.  Recommend medical therapy for coronary artery disease. She is significantly volume overloaded with evidence of mitral regurgitation by hemodynamics.  Continue IV diuresis.    Cardiac  Studies Echo 06/20/24:  1.  Left ventricular ejection fraction, by estimation, is 55 to 60%. The  left ventricle has normal function. The left ventricle has no regional  wall motion abnormalities. There is mild asymmetric left ventricular  hypertrophy of the basal-septal segment.  Left ventricular diastolic parameters are consistent with Grade II  diastolic dysfunction (pseudonormalization).   2. Right ventricular systolic function is mildly reduced. The right  ventricular size is normal. There is moderately elevated pulmonary artery  systolic pressure. The estimated right ventricular systolic pressure is  55.2 mmHg.   3. Left atrial size was mildly dilated.   4. Right atrial size was mild to moderately dilated.   5. There is no evidence of pericardial effusion.   6. The mitral valve is degenerative. Mild to moderate mitral valve  regurgitation. No evidence of mitral stenosis. Moderate mitral annular  calcification.   7. Gradient across aortic vavle prosthesis unchanged from previous. The  aortic valve has been repaired/replaced. Aortic valve regurgitation is not  visualized. There is a St. Jude mechanical valve present in the aortic  position. Procedure Date: 10/1998.  Echo findings are consistent with normal structure and function of the  aortic valve prosthesis. Aortic valve area, by VTI measures 1.27 cm.  Aortic valve mean gradient measures 11.7 mmHg. Aortic valve Vmax measures  2.30 m/s.   8. The inferior vena cava is dilated in size with <50% respiratory  variability, suggesting right atrial pressure of 15 mmHg.    Patient Profile   79 y.o. female with CAD s/p CABG, PAD, HFpEF, s/p St. Jude mechanical AVR on warfarin, hyperlipidemia and CKD 3a here with NSTEMI.   Assessment & Plan   # Type 2 NSTEMI:  # CAD s/p CABG:  # Hyperlipidemia:  History of CABG in 2020.  Now with shortness of breath and elevated high-sensitivity troponin to 1096.   She underwent left and right heart  cath and was found to have stable CAD without any clear culprit.  Continue medical management.  She is on aspirin , metoprolol , and rosuvastatin .  LDL was 60 on 12/2023.  She has hypertriglyceridemia.  Will repeat fasting lipids.  Given her cardiovascular disease, especially multiple vascular territories her LDL needs to be less than 55 and triglycerides less than 150.  Would add Vascepa  if triglycerides remain over 150.  # HFpEF:  # Mitral digitation:  Right heart cath revealed significant volume overload and concern for more significant mitral regurgitation than was noted on echo.  She is feeling better with diuresis.  She responded with metolazone  and higher dose of IV Lasix .  Today renal function is stable but starting to climb.  She already received a dose of IV Lasix  today.  Will hold off on an additional dose this afternoon and plan to transition to torsemide  40 mg p.o. daily tomorrow assuming her renal function is stable.  She was on 20 daily at home.  # Mechanical AVR:  Warfarin was held for cath.  Now resumed on 2/5.  She is on heparin  while transitioning.  Gradient stable on echo this admission.  INR goal is 2-3.  # Anemia:  H/H is downtrended to a nadir of 7.4.  No melena or hematochezia.  She received packed red blood cells and hemoglobin is now stable at 9.2.  She underwent upper endoscopy on 2/3 and had possible esophagitis but no bleeding.  Possibly related to GERD.  There were no schistocytes on smear but haptoglobin was low and LDH was high, which can be seen with shear.  For questions or updates, please contact [facility] Please consult [email or web address] for contact info under       Signed, Annabella Scarce, MD  06/29/2024, 10:06 AM    "

## 2024-06-29 NOTE — Progress Notes (Signed)
"  Heart Failure Stewardship Pharmacist Progress Note   PCP: Medina-Vargas, Abigail BROCKS, NP PCP-Cardiologist: Abigail ONEIDA Decent, MD    HPI:  [name] with PMH of CAD s/p CABG, mechanical AVR, PAD, CHF, HTN, HLD, and CKD IIIa.   Presented to the ED on 1/28 with shortness of breath, chest pain, cough, congestion, fatigue, and nausea. CXR with increased pulmonary vascular congestion and interstitial edema. proBNP elevated. ECHO 1/28 with LVEF 55-60%, no RWMA, mild LVH, G2DD, RV mildly reduced. Cardiology consulted and underwent R/LHC on 2/4. Found to have moderate distal left main stenosis, patent prox LAD stent with mild in-stent restenosis, patent RIMA to right PDA. RA 13, PA 36, wedge 24, CO 5.9, CI 3.3. Recommneded medical therapy for CAD and continued IV diuresis.   Patient reports she is feeling ok. States that she is improved from yesterday.  Remains on 3L O2. Reviewed GDMT and goals of therapy. She still works as a interior and spatial designer and states she takes her torsemide  once daily around her appts for that day (sometimes early in the morning or afternoon on other days). She is requesting that she continue with once daily dosing rather than BID dosing for diuretics. Agreeable to using [facility] TOC pharmacy at discharge.   Current HF Medications: Diuretic: furosemide  80 mg IV BID Beta Blocker: metoprolol  XL 50 mg daily  Prior to admission HF Medications: Diuretic: torsemide  20 mg daily Beta blocker: metoprolol  XL 50 mg daily ACE/ARB/ARNI: valsartan  40 mg daily  Pertinent Lab Values: Serum creatinine 1.54, BUN 40, Potassium 3.7, Sodium 142, proBNP 10760, Magnesium  2.1, A1c 5.7   Vital Signs: Weight: 168 lbs (admission weight: 166 lbs) Blood pressure: 110-140/40s  Heart rate: 50-60s  I/O: net +0.3L yesterday; net -4.5L since admission  Medication Assistance / Insurance Benefits Check: Does the patient have prescription insurance?  Yes Type of insurance plan: Medicare  Does the patient qualify for  medication assistance through manufacturers or grants?   Yes Eligible grants and/or patient assistance programs: pending Medication assistance applications in progress: none  Medication assistance applications approved: none Approved medication assistance renewals will be completed by: pending  Outpatient Pharmacy:  Prior to admission outpatient pharmacy: [facility] Is the patient willing to use [facility] TOC pharmacy at discharge? Yes Is the patient willing to transition their outpatient pharmacy to utilize a [facility] outpatient pharmacy?   No    Assessment: 1. Acute on chronic diastolic CHF (LVEF 55-60%). NYHA class III symptoms. - Continue furosemide  80 mg IV BID. Strict I/Os and daily weights. Keep K>4 and Mg>2. - Continue metoprolol  XL 50 mg daily - BP variable - may benefit from ARB vs Entresto once stabilizes off IV lasix  - Consider adding spironolactone 25 mg daily and Farxiga 10 mg daily   Plan: 1) Medication changes recommended at this time: - Add spironolactone 25 mg daily - Add Farxiga 10 mg daily - Torsemide  40 mg daily at discharge  2) Patient assistance: - Deductible remaining - Farxiga copay $182 - Jardiance copay $204 - Would need help with medication costs - is eligible for [employer] grant  3)  Education  - Patient has been educated on current HF medications and potential additions to HF medication regimen - Patient verbalizes understanding that over the next few months, these medication doses may change and more medications may be added to optimize HF regimen - Patient has been educated on basic disease state pathophysiology and goals of therapy   Duwaine Plant, PharmD, BCPS Heart Failure Psychiatrist (  [phone] ° ° °"

## 2024-06-29 NOTE — Plan of Care (Signed)
" °  Problem: Tissue Perfusion: Goal: Adequacy of tissue perfusion will improve 06/29/2024 1755 by Edsel Velinda SAILOR, RN Outcome: Progressing 06/29/2024 1754 by Edsel Velinda SAILOR, RN Outcome: Progressing   Problem: Clinical Measurements: Goal: Will remain free from infection 06/29/2024 1755 by Edsel Velinda SAILOR, RN Outcome: Progressing 06/29/2024 1754 by Edsel Velinda SAILOR, RN Outcome: Progressing   Problem: Clinical Measurements: Goal: Diagnostic test results will improve 06/29/2024 1755 by Edsel Velinda SAILOR, RN Outcome: Progressing 06/29/2024 1754 by Edsel Velinda SAILOR, RN Outcome: Progressing   "

## 2024-06-29 NOTE — Telephone Encounter (Signed)
 Patient referred to infusion pharmacy team for ambulatory infusion of IV iron .  Insurance - Medicare Part A/B Site of care - Site of care: [facility] Dx code - D50.9, N18.32 IV Iron  Therapy - Feraheme 510 mg x 1, pt previously received Venofer  200 mg on 06/28/24  Infusion appointments - Scheduling team will schedule patient as soon as possible.    Thank you,  Norton Blush, PharmD, [facility] Pharmacist [facility]

## 2024-06-29 NOTE — Telephone Encounter (Signed)
 Pharmacy Patient Advocate Encounter  Insurance verification completed.    The patient is insured through [employer]. Patient has Medicare and is not eligible for a copay card, but may be able to apply for patient assistance or Medicare RX Payment Plan (Patient Must reach out to their plan, if eligible for payment plan), if available.    Ran test claim for Farxiga  10mg  tablet and the current 30 day co-pay is $182.59 due to deductible.  Ran test claim for Jardiance 10mg  tablet and the current 30 day co-pay is $204.82 due to deductible.  This test claim was processed through [facility]- copay amounts may vary at other pharmacies due to pharmacy/plan contracts, or as the patient moves through the different stages of their insurance plan.

## 2024-06-29 NOTE — Progress Notes (Signed)
 " PROGRESS NOTE    GRISSEL TYRELL  FMW:[id number] DOB: 1946-01-13 DOA: 06/20/2024 PCP: [name], NP    Chief Complaint  Patient presents with   Shortness of Breath   Nausea   Chills    Brief Narrative:  Aubrielle Stroud is a 79 year old female with past medical history of essential hypertension, type 2 diabetes mellitus, hyperlipidemia, peripheral vascular disease, carotid artery disease, CKD stage IIIb, chronic diastolic CHF, depression with anxiety, chronic angle-closure glaucoma, aortic valve disease with replacement, iron  deficiency anemia, CABG, angina pectoris, lumbar stenosis, and insomnia. She presented to the ED on 1/28 after experiencing shortness of breath, chills, nausea, dizziness, and chest heaviness the day prior. She notes feeling worse while lying flat and at night. She denies sick contacts. In the ED, her SpO2 was 83% on room air, RR 24. She was found to have acute on chronic diastolic CHF.  Patient also noted with elevated troponins.  Cardiology consulted patient underwent cardiac catheterization.  Patient also seen in consultation by GI due to epigastric pain and anemia and underwent upper endoscopy.   Assessment & Plan:   Principal Problem:   Acute on chronic diastolic CHF (congestive heart failure) (HCC) Active Problems:   CAD (coronary artery disease)   Non-ST elevation (NSTEMI) myocardial infarction (HCC)   Essential hypertension   CKD stage 3b, GFR 30-44 ml/min (HCC)   Type 2 diabetes mellitus with hyperlipidemia (HCC)   Chronic anemia   PVD (peripheral vascular disease)   Anxiety   Acute systolic congestive heart failure (HCC)   Hypertriglyceridemia  Acute on chronic diastolic CHF (congestive heart failure) (HCC) Echocardiogram with preserved LV systolic function with EF 55 to 60%, mild asymmetric left ventricular hypertrophy of the basal segment. Grade II diastolic dysfunction with pseudo normalization EA, RV systolic function with mild  reduction, RVSP 55.2 mmHg, LA with mild dilatation, RA with mild to moderate dilatation, no pericardial effusion, mild to moderate mitral valve regurgitation, sp aortic valve replacement, (St Jude mechanical valve),    02/04 cardiac catheterization  RA 13 RV 65/5 PA 60/16 mean 36  PCWP 24 with V wave of 45  Cardiac output 5,89 and index 3.3    Elevated filling pressures.   -Urine output of 1.7 L over the past 24 hours. -Patient still volume overloaded on examination -Slight bump in creatinine. -Continue IV Lasix  twice daily. -Metolazone  held today per cardiology -Cardiology planning on transitioning patient to torsemide  40 mg daily starting tomorrow. -Cardiology following and appreciate their input and recommendations .   Acute hypoxemic respiratory failure due to acute CHF exacerbation - Patient on IV Lasix . -Patient with sats of 96% on 3 L nasal cannula -Continue diuresis.  Mechanical AVR -Coumadin  held precath and resumed on 2/5. -Continue heparin  bridge. -Coumadin  per pharmacy.   CAD (coronary artery disease) NSTEMI.    Coronary angiography with moderate distal left main stenosis, patent proximal LAD stent with mild in stent restenosis and borderline significant mid to distal LAD disease. Patent RIMA to right PDA  No culprit lesion  -Cardiology following and recommending medical management at this time -Continue Toprol -XL 50 mg daily -Currently on IV Lasix  -Continue aspirin  and Crestor . -Per cardiology.   Essential hypertension BP currently stable.   - Continue Lasix  80 mg IV every 12 hours per  - Continue Toprol -XL 50 mg daily.    CKD stage 3b, GFR 30-44 ml/min (HCC) Hypokalemia    Patient with slight bump in creatinine at two 1.54 from 1.42. -Potassium at  3.7. -Monitor electrolytes with diuresis.   Iron  deficiency anemia with chronic anemia  -Patient seen in consultation by GI -Patient status post upper endoscopy 2/3 with a 2 cm hiatal hernia, normal  stomach, normal examined duodenum no specimens collected. Status post IV iron . - Hemoglobin currently at 9.2.  - Follow H&H.  - Transfusion threshold hemoglobin < 8.    DVT prophylaxis: Heparin /Coumadin  Code Status: Full Family Communication: Updated patient.  No family at bedside. Disposition: TBD  Status is: Inpatient Remains inpatient appropriate because: Severity of illness   Consultants:  Gastroenterology: Dr. Rollin 06/25/2024 Cardiology: Dr. Lonni 06/20/2024  Procedures:  Upper endoscopy per GI: Dr. Rollin 06/26/2024 Chest x-ray 06/20/2024, 06/23/2024 2D echo 06/20/2024 Cardiac catheterization 06/27/2024   Antimicrobials:  Anti-infectives (From admission, onward)    None         Subjective: Patient laying in bed on the telephone.  Denies any chest pain.  Feels shortness of breath is improving states she used to cough when speaking however that has significantly improved.  No abdominal pain.  Tolerating current diet.  Objective: Vitals:   06/29/24 1121 06/29/24 1150 06/29/24 1550 06/29/24 1900  BP:  (!) 134/40 116/61 (!) 132/47  Pulse:  65 64 65  Resp:  18 19 20   Temp:  97.6 F (36.4 C) 98 F (36.7 C) 98.7 F (37.1 C)  TempSrc:  Oral Oral Oral  SpO2: (!) 89% 96% 100% 98%  Weight:      Height:        Intake/Output Summary (Last 24 hours) at 06/29/2024 2313 Last data filed at 06/29/2024 1712 Gross per 24 hour  Intake 174 ml  Output 3050 ml  Net -2876 ml   Filed Weights   06/26/24 0559 06/27/24 0415 06/28/24 0708  Weight: 77.1 kg 76.9 kg 76.5 kg    Examination:  General exam: Appears calm and comfortable  Respiratory system: Bibasilar crackles.  No wheezing.  Fair air movement.  Speaking in full sentences.  Cardiovascular system: S1 & S2 heard, RRR. No JVD, murmurs, rubs, gallops or clicks. No pedal edema. Gastrointestinal system: Abdomen is nondistended, soft and nontender. No organomegaly or masses felt. Normal bowel sounds heard. Central nervous  system: Alert and oriented. No focal neurological deficits. Extremities: Symmetric 5 x 5 power. Skin: No rashes, lesions or ulcers Psychiatry: Judgement and insight appear normal. Mood & affect appropriate.     Data Reviewed: I have personally reviewed following labs and imaging studies  CBC: Recent Labs  Lab 06/26/24 0237 06/27/24 0231 06/27/24 0839 06/27/24 1503 06/27/24 1507 06/28/24 0152 06/29/24 0238  WBC 4.5 5.3 4.8  --   --  4.6 4.6  NEUTROABS  --   --  4.0  --   --   --   --   HGB 7.7* 7.4* 8.3* 9.5*  9.9* 9.5* 8.8* 9.2*  HCT 26.0* 25.5* 27.6* 28.0*  29.0* 28.0* 28.9* 29.8*  MCV 87.2 89.5 86.8  --   --  86.5 85.6  PLT 127* 151 141*  --   --  143* 136*    Basic Metabolic Panel: Recent Labs  Lab 06/25/24 0117 06/26/24 1118 06/27/24 0231 06/27/24 1503 06/27/24 1507 06/28/24 0152 06/29/24 0238  NA 139 140 139 137  138 143 141 142  K 4.1 4.2 4.2 4.3  4.5 4.6 4.2 3.7  CL 104 109 107  --   --  105 104  CO2 23 21* 22  --   --  25 27  GLUCOSE 140* 100* 104*  --   --  115* 93  BUN 38* 41* 43*  --   --  42* 40*  CREATININE 1.48* 1.32* 1.41*  --   --  1.42* 1.54*  CALCIUM  8.4* 8.4* 8.2*  --   --  8.5* 9.2  MG  --   --   --   --   --  2.1  --     GFR: Estimated Creatinine Clearance: 28.9 mL/min (A) (by C-G formula based on SCr of 1.54 mg/dL (H)).  Liver Function Tests: No results for input(s): AST, ALT, ALKPHOS, BILITOT, PROT, ALBUMIN in the last 168 hours.  CBG: Recent Labs  Lab 06/28/24 1549 06/29/24 0626 06/29/24 1150 06/29/24 1547 06/29/24 2120  GLUCAP 113* 91 127* 180* 142*     Recent Results (from the past 240 hours)  Resp panel by RT-PCR (RSV, Flu A&B, Covid) Anterior Nasal Swab     Status: None   Collection Time: 06/20/24  9:08 AM   Specimen: Anterior Nasal Swab  Result Value Ref Range Status   SARS Coronavirus 2 by RT PCR NEGATIVE NEGATIVE Final   Influenza A by PCR NEGATIVE NEGATIVE Final   Influenza B by PCR NEGATIVE  NEGATIVE Final    Comment: (NOTE) The Xpert Xpress SARS-CoV-2/FLU/RSV plus assay is intended as an aid in the diagnosis of influenza from Nasopharyngeal swab specimens and should not be used as a sole basis for treatment. Nasal washings and aspirates are unacceptable for Xpert Xpress SARS-CoV-2/FLU/RSV testing.  Fact Sheet for Patients: bloggercourse.com  Fact Sheet for Healthcare Providers: seriousbroker.it  This test is not yet approved or cleared by the [place]  FDA and has been authorized for detection and/or diagnosis of SARS-CoV-2 by FDA under an Emergency Use Authorization (EUA). This EUA will remain in effect (meaning this test can be used) for the duration of the COVID-19 declaration under Section 564(b)(1) of the Act, 21 U.S.C. section 360bbb-3(b)(1), unless the authorization is terminated or revoked.     Resp Syncytial Virus by PCR NEGATIVE NEGATIVE Final    Comment: (NOTE) Fact Sheet for Patients: bloggercourse.com  Fact Sheet for Healthcare Providers: seriousbroker.it  This test is not yet approved or cleared by the [place]  FDA and has been authorized for detection and/or diagnosis of SARS-CoV-2 by FDA under an Emergency Use Authorization (EUA). This EUA will remain in effect (meaning this test can be used) for the duration of the COVID-19 declaration under Section 564(b)(1) of the Act, 21 U.S.C. section 360bbb-3(b)(1), unless the authorization is terminated or revoked.  Performed at [facility], [address], [place], [place] [address]          Radiology Studies: No results found.       Scheduled Meds:  aspirin  EC  81 mg Oral Daily   furosemide   80 mg Intravenous Once   insulin  aspart  0-9 Units Subcutaneous TID WC   latanoprost   1 drop Both Eyes QHS   metoprolol  succinate  50 mg Oral Daily   pantoprazole   40 mg Oral BID AC    rosuvastatin   20 mg Oral QPM   sertraline   150 mg Oral Daily   sodium chloride  flush  3 mL Intravenous Q12H   sodium chloride  flush  3 mL Intravenous Q12H   Warfarin - Pharmacist Dosing Inpatient   Does not apply q1600   Continuous Infusions:  heparin  1,350 Units/hr (06/29/24 0724)     LOS: 8 days    Time spent: 40 minutes    Toribio Hummer, MD [facility]  To contact the attending provider between 7A-7P or the covering provider during after hours 7P-7A, please log into the web site [email or web address] and access using universal [place] password for that web site. If you do not have the password, please call the hospital operator.  06/29/2024, 11:13 PM    "

## 2024-06-29 NOTE — Progress Notes (Addendum)
 PHARMACY - ANTICOAGULATION CONSULT NOTE  Pharmacy Consult for IV heparin  and warfarin Indication: chest pain/ACS and mAVR  Allergies[1]  Patient Measurements: Height: 5' 2 (157.5 cm) Weight: 76.5 kg (168 lb 9.6 oz) IBW/kg (Calculated) : 50.1 HEPARIN  DW (KG): 66.5  Vital Signs: Temp: 98 F (36.7 C) (02/06 0805) Temp Source: Oral (02/06 0805) BP: 147/42 (02/06 0805) Pulse Rate: 69 (02/06 0805)  Labs: Recent Labs    06/27/24 0231 06/27/24 0836 06/27/24 0839 06/27/24 1503 06/27/24 1507 06/28/24 0152 06/28/24 0910 06/29/24 0238  HGB 7.4*  --  8.3*   < > 9.5* 8.8*  --  9.2*  HCT 25.5*  --  27.6*   < > 28.0* 28.9*  --  29.8*  PLT 151  --  141*  --   --  143*  --  136*  LABPROT  --  18.3*  --   --   --   --  18.1* 18.7*  INR  --  1.4*  --   --   --   --  1.4* 1.5*  HEPARINUNFRC 0.44  --   --   --   --  0.28*  --  0.57  CREATININE 1.41*  --   --   --   --  1.42*  --  1.54*   < > = values in this interval not displayed.    Estimated Creatinine Clearance: 28.9 mL/min (A) (by C-G formula based on SCr of 1.54 mg/dL (H)).  Assessment: Abigail Oliver is a 79 y.o. year old female presented on 06/20/2024 with concern for CP and ACS. On warfarin prior to admission for mAVR. Prior to admission regimen, warfarin 1.5mg  STTh and 3mg  all other days (Last dose prior to admission 1/27 afternoon). Pharmacy consulted for heparin  management.   S/p cath finding chronic calcified CAD w/ no clear culprit likely demand ischemia. Heparin  level is therapeutic at 0.57, on 1300 units/hr. INR is subtherapeutic at 1.5. Hgb 9.2, plt 136. No s/sx of bleeding (does have some swelling on her arm) or infusion issues.  Goal of Therapy:  INR goal: 2.5-3.5 Heparin  level 0.3-0.7 units/ml Monitor platelets by anticoagulation protocol: Yes   Plan:  Warfarin 5 mg tonight (higher than PTA dosing) Continue heparin  infusion at 1350 units/hr  Monitor daily INR, daily heparin  level, signs/symptoms of bleeding    Thank you for allowing pharmacy to participate in this patient's care,  Suzen Sour, PharmD, BCCCP Clinical Pharmacist  Phone: [phone] 06/29/2024 9:51 AM  Please check AMION for all [facility] phone numbers After 10:00 PM, call Main Pharmacy [phone]     [1]  Allergies Allergen Reactions   Atorvastatin      Muscle pain   Gabapentin  Other (See Comments)    Stomach issues   Simvastatin  Other (See Comments)    Muscle pain   Sulfa  Antibiotics     unknown   Farxiga [Dapagliflozin] Nausea Only and Rash   Iodinated Contrast Media Rash     Red rash after cardiac cath 1 wk ago, ? Contrast allergy, requires 13 hr prep now per [email or web address]

## 2024-06-29 NOTE — Telephone Encounter (Signed)
 Auth Submission: NO AUTH NEEDED Site of care: [facility] Payer: Medicare A/B, [employer] Supp   Medication & CPT/J Code(s) submitted: Feraheme (ferumoxytol) [id number] Diagnosis Code: D50.9, N18.32 Route of submission (phone, fax, portal):  Phone # Fax # Auth type: Buy/Bill HB Units/visits requested: 510mg  x 1 dose Reference number:  Approval from: 06/29/2024 to 09/26/24    Dagoberto Armour, CPhT [facility] Phone: [phone] 06/29/2024

## 2024-06-29 NOTE — Progress Notes (Signed)
 Heart Failure Nurse Navigator Progress Note  PCP: Medina-Vargas, Monina C, NP PCP-Cardiologist: [facility] Admission Diagnosis: Acute systolic congestive heart failure.  Admitted from: Home  Presentation:   Abigail Oliver presented with shortness of breath, chills , and nausea. BP 166/93, HR 71, Pro BNP 10,760, INR 2.3, Troponin 351. CXR showed volume overload,per MD Right heart cath revealed significant volume overload and concern for more significant mitral regurgitation than was noted on echo.  underwent upper endoscopy on 2/3 and had possible esophagitis but no bleeding. Possibly related to GERD.  Patient was educated on the sign and symptoms of heart failure, daily weights, when to call her doctor or go to the ED. Diet/ fluid restrictions, taking all her medications as prescribed and attending all medical appointments. Patient verbalized her understanding of all education. A HF TOC appointment was scheduled for 07/04/2024 @ 2 pm.   ECHO/ LVEF: 55-60%  Clinical Course:  Past Medical History:  Diagnosis Date   Anemia    Anxiety    Aortic stenosis    a. Now has St Jude mechanical aortic valve (6/00). b. Echo (6/15) with EF 60-65%, mechanical aortic valve with mean gradient 27 mmHg.   Arthritis    a. Possible c-spine arthritis with pain down left arm.     Carotid artery disease    a. Carotid dopplers (7/16) with 40-59% BICA stenosis.     Chronic angle-closure glaucoma(365.23)    Chronic diastolic CHF (congestive heart failure) (HCC)    a.  RHC (7/15) with mean RA 2, PA 22/11, mean PCWP 9, CI 3.79.   Contrast media allergy    Coronary atherosclerosis of native coronary artery    a. CABG at time of AVR in 6/00 with RIMA-RCA. b. Abnl nuc 11/2013 -> LHC (7/15) with patent RIMA-RCA, 80% mRCA, 80% pLAD with FFR 0.73, treated with DES to pLAD.   Depression    Essential hypertension    GERD (gastroesophageal reflux disease)    Hyperlipidemia    Low back pain    Neuromuscular disorder  (HCC)    neuropathy   Peripheral vascular disease    a, H/o Right SFA stent. b. Peripheral arterial dopplers (7/16) with right SFA stent patent.     PONV (postoperative nausea and vomiting)    N&V   Type II diabetes mellitus (HCC)      Social History   Socioeconomic History   Marital status: Married    Spouse name: Fairy   Number of children: 2   Years of education: 14   Highest education level: Not on file  Occupational History    Comment: hair dresser  Tobacco Use   Smoking status: Never   Smokeless tobacco: Never  Vaping Use   Vaping status: Never Used  Substance and Sexual Activity   Alcohol  use: Not Currently   Drug use: Not Currently   Sexual activity: Never  Other Topics Concern   Not on file  Social History Narrative   Pt is a high school graduate with 2 years of college. Married in 1967 she has 1 son born 24 and 1 daughter born 72 and 1 grandchild. 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      Caffeine use- 2-3 /day, soda, tea       Lives with husband one story home   Social Drivers of Health   Tobacco Use: Low Risk (06/26/2024)   Patient History    Smoking Tobacco Use: Never    Smokeless  Tobacco Use: Never    Passive Exposure: Not on file  Financial Resource Strain: Low Risk (06/29/2024)   Overall Financial Resource Strain (CARDIA)    Difficulty of Paying Living Expenses: Not hard at all  Food Insecurity: No Food Insecurity (06/21/2024)   Epic    Worried About Programme Researcher, Broadcasting/film/video in the Last Year: Never true    Ran Out of Food in the Last Year: Never true  Transportation Needs: No Transportation Needs (06/29/2024)   Epic    Lack of Transportation (Medical): No    Lack of Transportation (Non-Medical): No  Physical Activity: Inactive (07/22/2023)   Exercise Vital Sign    Days of Exercise per Week: 0 days    Minutes of Exercise per Session: 0 min  Stress: Stress Concern Present (07/22/2023)   [employer] of Occupational  Health - Occupational Stress Questionnaire    Feeling of Stress : Very much  Social Connections: Socially Isolated (06/21/2024)   Social Connection and Isolation Panel    Frequency of Communication with Friends and Family: Once a week    Frequency of Social Gatherings with Friends and Family: Once a week    Attends Religious Services: Never    Database Administrator or Organizations: No    Attends Banker Meetings: Never    Marital Status: Married  Depression (PHQ2-9): Medium Risk (04/02/2024)   Depression (PHQ2-9)    PHQ-2 Score: 9  Alcohol  Screen: Low Risk (06/29/2024)   Alcohol  Screen    Last Alcohol  Screening Score (AUDIT): 0  Housing: Unknown (06/29/2024)   Epic    Unable to Pay for Housing in the Last Year: No    Number of Times Moved in the Last Year: Not on file    Homeless in the Last Year: No  Utilities: Not At Risk (06/21/2024)   Epic    Threatened with loss of utilities: No  Health Literacy: Not on file    High Risk Criteria for Readmission and/or Poor Patient Outcomes: Heart failure hospital admissions (last 6 months): 1  No Show rate: 6 %  Difficult social situation: No, lives with her husband Demonstrates medication adherence: Yes Primary Language: English  Literacy level: HOH, Reading, writing, and comprehension  Barriers of Care:   Diet/ fluid restrictions ( drinks more then 64 oz daily)  Daily weights  Considerations/Referrals:   Referral made to Heart Failure Pharmacist Stewardship: Yes Referral made to Heart Failure CSW/NCM TOC: NA Referral made to Heart & Vascular TOC clinic: Yes, 07/04/2024 @ 2 pm  Items for Follow-up on DC/TOC: Continued HF education  Diet/ fluid restrictions/ daily weights   Stephane Haddock, BSN, RN Heart Failure Teacher, Adult Education Only

## 2024-06-29 NOTE — Telephone Encounter (Signed)
 Counselor visited patient in hospital 2.24.26 @11 :30 AM for approximately an hour as a courtesy call.  Almarie Sprang, [facility]

## 2024-06-29 NOTE — Telephone Encounter (Signed)
 Pharmacy Patient Advocate Encounter  Insurance verification completed.    The patient is insured through [employer]. Patient has Medicare and is not eligible for a copay card, but may be able to apply for patient assistance or Medicare RX Payment Plan (Patient Must reach out to their plan, if eligible for payment plan), if available.    Ran test claim for Vascepa  1Gm capsules(2gm dose) and the current 30 day co-pay is $91.18 due to deductible.   This test claim was processed through [facility]- copay amounts may vary at other pharmacies due to pharmacy/plan contracts, or as the patient moves through the different stages of their insurance plan.

## 2024-07-03 ENCOUNTER — Ambulatory Visit: Admitting: Adult Health

## 2024-07-04 ENCOUNTER — Ambulatory Visit (HOSPITAL_COMMUNITY)

## 2024-07-04 ENCOUNTER — Ambulatory Visit

## 2024-07-06 ENCOUNTER — Inpatient Hospital Stay: Admitting: Adult Health

## 2024-07-09 ENCOUNTER — Ambulatory Visit (HOSPITAL_COMMUNITY)

## 2024-07-20 ENCOUNTER — Ambulatory Visit: Admitting: Professional Counselor

## 2024-07-30 ENCOUNTER — Ambulatory Visit: Admitting: Professional Counselor

## 2024-08-13 ENCOUNTER — Ambulatory Visit: Admitting: Professional Counselor

## 2024-08-27 ENCOUNTER — Ambulatory Visit: Admitting: Professional Counselor

## 2025-01-25 ENCOUNTER — Ambulatory Visit (HOSPITAL_COMMUNITY)
